# Patient Record
Sex: Male | Born: 1950 | Race: White | Hispanic: No | Marital: Single | Smoking: Never smoker
Health system: Southern US, Community
[De-identification: ages and names within clinical notes are randomized; demographics above are authoritative.]

## PROBLEM LIST (undated history)

## (undated) DIAGNOSIS — H9193 Unspecified hearing loss, bilateral: Secondary | ICD-10-CM

## (undated) DIAGNOSIS — I509 Heart failure, unspecified: Secondary | ICD-10-CM

## (undated) DIAGNOSIS — M1990 Unspecified osteoarthritis, unspecified site: Secondary | ICD-10-CM

## (undated) DIAGNOSIS — H43399 Other vitreous opacities, unspecified eye: Secondary | ICD-10-CM

## (undated) DIAGNOSIS — I2510 Atherosclerotic heart disease of native coronary artery without angina pectoris: Secondary | ICD-10-CM

## (undated) DIAGNOSIS — G931 Anoxic brain damage, not elsewhere classified: Secondary | ICD-10-CM

## (undated) DIAGNOSIS — F329 Major depressive disorder, single episode, unspecified: Secondary | ICD-10-CM

## (undated) DIAGNOSIS — N1832 Chronic kidney disease, stage 3b: Secondary | ICD-10-CM

## (undated) DIAGNOSIS — I10 Essential (primary) hypertension: Secondary | ICD-10-CM

## (undated) DIAGNOSIS — C801 Malignant (primary) neoplasm, unspecified: Secondary | ICD-10-CM

## (undated) DIAGNOSIS — E1122 Type 2 diabetes mellitus with diabetic chronic kidney disease: Secondary | ICD-10-CM

## (undated) DIAGNOSIS — N200 Calculus of kidney: Secondary | ICD-10-CM

## (undated) DIAGNOSIS — I5022 Chronic systolic (congestive) heart failure: Secondary | ICD-10-CM

## (undated) DIAGNOSIS — Z955 Presence of coronary angioplasty implant and graft: Secondary | ICD-10-CM

## (undated) DIAGNOSIS — F419 Anxiety disorder, unspecified: Secondary | ICD-10-CM

## (undated) DIAGNOSIS — H269 Unspecified cataract: Secondary | ICD-10-CM

## (undated) DIAGNOSIS — I219 Acute myocardial infarction, unspecified: Secondary | ICD-10-CM

## (undated) DIAGNOSIS — N529 Male erectile dysfunction, unspecified: Secondary | ICD-10-CM

## (undated) DIAGNOSIS — L409 Psoriasis, unspecified: Secondary | ICD-10-CM

## (undated) DIAGNOSIS — F32A Depression, unspecified: Secondary | ICD-10-CM

## (undated) DIAGNOSIS — E785 Hyperlipidemia, unspecified: Secondary | ICD-10-CM

## (undated) DIAGNOSIS — F39 Unspecified mood [affective] disorder: Secondary | ICD-10-CM

## (undated) DIAGNOSIS — I252 Old myocardial infarction: Secondary | ICD-10-CM

## (undated) DIAGNOSIS — K219 Gastro-esophageal reflux disease without esophagitis: Secondary | ICD-10-CM

## (undated) DIAGNOSIS — G709 Myoneural disorder, unspecified: Secondary | ICD-10-CM

## (undated) HISTORY — DX: Unspecified osteoarthritis, unspecified site: M19.90

## (undated) HISTORY — DX: Hyperlipidemia, unspecified: E78.5

## (undated) HISTORY — DX: Malignant (primary) neoplasm, unspecified: C80.1

## (undated) HISTORY — DX: Major depressive disorder, single episode, unspecified: F32.9

## (undated) HISTORY — DX: Calculus of kidney: N20.0

## (undated) HISTORY — DX: Unspecified cataract: H26.9

## (undated) HISTORY — DX: Heart failure, unspecified: I50.9

## (undated) HISTORY — DX: Gastro-esophageal reflux disease without esophagitis: K21.9

## (undated) HISTORY — DX: Anxiety disorder, unspecified: F41.9

## (undated) HISTORY — DX: Depression, unspecified: F32.A

## (undated) HISTORY — PX: BASAL CELL CARCINOMA EXCISION: SHX1214

## (undated) HISTORY — DX: Acute myocardial infarction, unspecified: I21.9

## (undated) HISTORY — DX: Other vitreous opacities, unspecified eye: H43.399

## (undated) HISTORY — DX: Myoneural disorder, unspecified: G70.9

## (undated) HISTORY — DX: Essential (primary) hypertension: I10

## (undated) HISTORY — DX: Atherosclerotic heart disease of native coronary artery without angina pectoris: I25.10

## (undated) HISTORY — PX: HERNIA REPAIR: SHX51

## (undated) HISTORY — DX: Male erectile dysfunction, unspecified: N52.9

---

## 2000-02-21 ENCOUNTER — Ambulatory Visit (HOSPITAL_BASED_OUTPATIENT_CLINIC_OR_DEPARTMENT_OTHER): Admission: RE | Admit: 2000-02-21 | Discharge: 2000-02-21 | Payer: Self-pay | Admitting: Surgery

## 2000-02-21 ENCOUNTER — Encounter (INDEPENDENT_AMBULATORY_CARE_PROVIDER_SITE_OTHER): Payer: Self-pay | Admitting: *Deleted

## 2000-05-21 ENCOUNTER — Encounter: Admission: RE | Admit: 2000-05-21 | Discharge: 2000-05-21 | Payer: Self-pay | Admitting: Surgery

## 2000-05-21 ENCOUNTER — Encounter: Payer: Self-pay | Admitting: Surgery

## 2001-04-13 ENCOUNTER — Encounter: Admission: RE | Admit: 2001-04-13 | Discharge: 2001-07-12 | Payer: Self-pay | Admitting: Family Medicine

## 2003-11-04 ENCOUNTER — Emergency Department (HOSPITAL_COMMUNITY): Admission: AD | Admit: 2003-11-04 | Discharge: 2003-11-04 | Payer: Self-pay | Admitting: Family Medicine

## 2004-05-03 ENCOUNTER — Encounter: Admission: RE | Admit: 2004-05-03 | Discharge: 2004-05-03 | Payer: Self-pay | Admitting: Family Medicine

## 2004-07-23 ENCOUNTER — Ambulatory Visit: Payer: Self-pay | Admitting: Family Medicine

## 2005-02-24 ENCOUNTER — Ambulatory Visit: Payer: Self-pay | Admitting: Family Medicine

## 2005-03-24 ENCOUNTER — Ambulatory Visit: Payer: Self-pay | Admitting: Family Medicine

## 2005-04-03 ENCOUNTER — Ambulatory Visit: Payer: Self-pay | Admitting: Family Medicine

## 2005-04-15 ENCOUNTER — Ambulatory Visit: Payer: Self-pay | Admitting: Family Medicine

## 2005-04-22 ENCOUNTER — Encounter: Admission: RE | Admit: 2005-04-22 | Discharge: 2005-04-22 | Payer: Self-pay | Admitting: Family Medicine

## 2005-06-04 ENCOUNTER — Ambulatory Visit: Payer: Self-pay | Admitting: Family Medicine

## 2005-06-25 ENCOUNTER — Ambulatory Visit: Payer: Self-pay | Admitting: Family Medicine

## 2005-09-11 ENCOUNTER — Ambulatory Visit: Payer: Self-pay | Admitting: Family Medicine

## 2005-11-20 ENCOUNTER — Ambulatory Visit: Payer: Self-pay | Admitting: Family Medicine

## 2005-11-20 ENCOUNTER — Inpatient Hospital Stay (HOSPITAL_COMMUNITY): Admission: EM | Admit: 2005-11-20 | Discharge: 2005-11-21 | Payer: Self-pay | Admitting: Emergency Medicine

## 2005-12-16 ENCOUNTER — Ambulatory Visit: Payer: Self-pay | Admitting: Family Medicine

## 2005-12-31 ENCOUNTER — Encounter: Admission: RE | Admit: 2005-12-31 | Discharge: 2005-12-31 | Payer: Self-pay | Admitting: Family Medicine

## 2006-01-13 ENCOUNTER — Encounter: Admission: RE | Admit: 2006-01-13 | Discharge: 2006-01-13 | Payer: Self-pay | Admitting: Family Medicine

## 2006-01-13 ENCOUNTER — Other Ambulatory Visit: Admission: RE | Admit: 2006-01-13 | Discharge: 2006-01-13 | Payer: Self-pay | Admitting: Interventional Radiology

## 2006-01-13 ENCOUNTER — Encounter (INDEPENDENT_AMBULATORY_CARE_PROVIDER_SITE_OTHER): Payer: Self-pay | Admitting: *Deleted

## 2006-04-24 ENCOUNTER — Ambulatory Visit: Payer: Self-pay | Admitting: Family Medicine

## 2006-05-01 ENCOUNTER — Ambulatory Visit: Payer: Self-pay | Admitting: Family Medicine

## 2007-04-12 DIAGNOSIS — F411 Generalized anxiety disorder: Secondary | ICD-10-CM | POA: Insufficient documentation

## 2007-04-12 DIAGNOSIS — E782 Mixed hyperlipidemia: Secondary | ICD-10-CM | POA: Insufficient documentation

## 2007-04-12 DIAGNOSIS — I10 Essential (primary) hypertension: Secondary | ICD-10-CM | POA: Insufficient documentation

## 2007-04-12 DIAGNOSIS — F39 Unspecified mood [affective] disorder: Secondary | ICD-10-CM | POA: Insufficient documentation

## 2007-04-12 DIAGNOSIS — K219 Gastro-esophageal reflux disease without esophagitis: Secondary | ICD-10-CM | POA: Insufficient documentation

## 2007-04-30 ENCOUNTER — Ambulatory Visit: Payer: Self-pay | Admitting: Family Medicine

## 2007-04-30 LAB — CONVERTED CEMR LAB
Bilirubin Urine: NEGATIVE
Glucose, Urine, Semiquant: NEGATIVE
Ketones, urine, test strip: NEGATIVE
Specific Gravity, Urine: 1.02

## 2007-05-03 LAB — CONVERTED CEMR LAB
Alkaline Phosphatase: 36 units/L — ABNORMAL LOW (ref 39–117)
BUN: 18 mg/dL (ref 6–23)
Basophils Absolute: 0.1 10*3/uL (ref 0.0–0.1)
Basophils Relative: 1.5 % — ABNORMAL HIGH (ref 0.0–1.0)
CO2: 27 meq/L (ref 19–32)
Cholesterol: 168 mg/dL (ref 0–200)
GFR calc Af Amer: 89 mL/min
HDL: 51.8 mg/dL (ref >39.0)
Hemoglobin: 13.7 g/dL (ref 13.0–17.0)
Lymphocytes Relative: 23.4 % (ref 12.0–46.0)
MCHC: 34.5 g/dL (ref 30.0–36.0)
Monocytes Absolute: 0.6 10*3/uL (ref 0.2–0.7)
Monocytes Relative: 10.1 % (ref 3.0–11.0)
Neutro Abs: 3.6 10*3/uL (ref 1.4–7.7)
PSA: 2.45 ng/mL (ref 0.10–4.00)
Potassium: 4.4 meq/L (ref 3.5–5.1)
Sodium: 136 meq/L (ref 135–145)
TSH: 1.72 microintl units/mL (ref 0.35–5.50)
Total Protein: 7.1 g/dL (ref 6.0–8.3)

## 2007-05-10 ENCOUNTER — Ambulatory Visit: Payer: Self-pay | Admitting: Family Medicine

## 2007-05-11 ENCOUNTER — Encounter: Payer: Self-pay | Admitting: Family Medicine

## 2007-05-11 LAB — CONVERTED CEMR LAB

## 2007-06-14 ENCOUNTER — Encounter: Payer: Self-pay | Admitting: Family Medicine

## 2007-06-25 ENCOUNTER — Ambulatory Visit: Payer: Self-pay | Admitting: Internal Medicine

## 2007-07-09 ENCOUNTER — Encounter: Payer: Self-pay | Admitting: Family Medicine

## 2007-07-09 ENCOUNTER — Ambulatory Visit: Payer: Self-pay | Admitting: Internal Medicine

## 2007-10-12 ENCOUNTER — Ambulatory Visit: Payer: Self-pay | Admitting: Family Medicine

## 2007-10-12 DIAGNOSIS — F528 Other sexual dysfunction not due to a substance or known physiological condition: Secondary | ICD-10-CM | POA: Insufficient documentation

## 2008-01-06 ENCOUNTER — Ambulatory Visit: Payer: Self-pay | Admitting: Family Medicine

## 2008-01-11 ENCOUNTER — Telehealth: Payer: Self-pay | Admitting: Family Medicine

## 2008-03-30 ENCOUNTER — Ambulatory Visit: Payer: Self-pay | Admitting: Family Medicine

## 2008-04-26 ENCOUNTER — Ambulatory Visit: Payer: Self-pay | Admitting: Family Medicine

## 2008-04-26 DIAGNOSIS — L02419 Cutaneous abscess of limb, unspecified: Secondary | ICD-10-CM | POA: Insufficient documentation

## 2008-04-26 DIAGNOSIS — L03119 Cellulitis of unspecified part of limb: Secondary | ICD-10-CM

## 2008-05-02 ENCOUNTER — Ambulatory Visit: Payer: Self-pay | Admitting: Family Medicine

## 2008-05-02 LAB — CONVERTED CEMR LAB
Bilirubin Urine: NEGATIVE
Nitrite: NEGATIVE
Urobilinogen, UA: 0.2
WBC Urine, dipstick: NEGATIVE

## 2008-05-03 LAB — CONVERTED CEMR LAB
ALT: 25 units/L (ref 0–53)
AST: 22 units/L (ref 0–37)
Basophils Relative: 0.7 % (ref 0.0–3.0)
Bilirubin, Direct: 0.1 mg/dL (ref 0.0–0.3)
CO2: 29 meq/L (ref 19–32)
Calcium: 9.5 mg/dL (ref 8.4–10.5)
Chloride: 104 meq/L (ref 96–112)
Creatinine, Ser: 1.1 mg/dL (ref 0.4–1.5)
Glucose, Bld: 107 mg/dL — ABNORMAL HIGH (ref 70–99)
Hemoglobin: 13.8 g/dL (ref 13.0–17.0)
LDL Cholesterol: 128 mg/dL — ABNORMAL HIGH (ref 0–99)
Lymphocytes Relative: 19.7 % (ref 12.0–46.0)
Monocytes Relative: 7.1 % (ref 3.0–12.0)
Neutro Abs: 4.9 10*3/uL (ref 1.4–7.7)
Neutrophils Relative %: 71.6 % (ref 43.0–77.0)
PSA: 2.5 ng/mL (ref 0.10–4.00)
RBC: 4.43 M/uL (ref 4.22–5.81)
TSH: 1.29 microintl units/mL (ref 0.35–5.50)
Total Bilirubin: 0.8 mg/dL (ref 0.3–1.2)
Total CHOL/HDL Ratio: 4.5
Total Protein: 7.4 g/dL (ref 6.0–8.3)
VLDL: 27 mg/dL (ref 0–40)
WBC: 6.8 10*3/uL (ref 4.5–10.5)

## 2008-05-04 ENCOUNTER — Telehealth: Payer: Self-pay | Admitting: Family Medicine

## 2008-05-08 ENCOUNTER — Ambulatory Visit: Payer: Self-pay | Admitting: Family Medicine

## 2008-05-31 ENCOUNTER — Ambulatory Visit: Payer: Self-pay | Admitting: Family Medicine

## 2008-06-27 ENCOUNTER — Telehealth: Payer: Self-pay | Admitting: Family Medicine

## 2008-08-02 ENCOUNTER — Ambulatory Visit: Payer: Self-pay | Admitting: Family Medicine

## 2008-09-11 ENCOUNTER — Ambulatory Visit: Payer: Self-pay | Admitting: Family Medicine

## 2008-09-11 DIAGNOSIS — M542 Cervicalgia: Secondary | ICD-10-CM | POA: Insufficient documentation

## 2008-09-22 DIAGNOSIS — C801 Malignant (primary) neoplasm, unspecified: Secondary | ICD-10-CM

## 2008-09-22 HISTORY — DX: Malignant (primary) neoplasm, unspecified: C80.1

## 2009-03-06 ENCOUNTER — Telehealth: Payer: Self-pay | Admitting: Family Medicine

## 2009-05-25 ENCOUNTER — Ambulatory Visit: Payer: Self-pay | Admitting: Family Medicine

## 2009-05-25 LAB — CONVERTED CEMR LAB
Bilirubin Urine: NEGATIVE
Ketones, urine, test strip: NEGATIVE
Nitrite: NEGATIVE
Protein, U semiquant: NEGATIVE
Urobilinogen, UA: 0.2

## 2009-05-30 LAB — CONVERTED CEMR LAB
AST: 23 units/L (ref 0–37)
Alkaline Phosphatase: 26 units/L — ABNORMAL LOW (ref 39–117)
Basophils Relative: 0 % (ref 0.0–3.0)
Bilirubin, Direct: 0 mg/dL (ref 0.0–0.3)
Calcium: 9.4 mg/dL (ref 8.4–10.5)
Cholesterol: 245 mg/dL — ABNORMAL HIGH (ref 0–200)
Creatinine, Ser: 1.3 mg/dL (ref 0.4–1.5)
Direct LDL: 163.6 mg/dL
Eosinophils Absolute: 0.1 10*3/uL (ref 0.0–0.7)
Eosinophils Relative: 1.5 % (ref 0.0–5.0)
GFR calc non Af Amer: 60.11 mL/min (ref >60)
Hemoglobin: 13.4 g/dL (ref 13.0–17.0)
Lymphocytes Relative: 25.1 % (ref 12.0–46.0)
Monocytes Relative: 10.5 % (ref 3.0–12.0)
Neutro Abs: 3.4 10*3/uL (ref 1.4–7.7)
Neutrophils Relative %: 62.9 % (ref 43.0–77.0)
RBC: 4.22 M/uL (ref 4.22–5.81)
Sodium: 142 meq/L (ref 135–145)
Total CHOL/HDL Ratio: 6
Total Protein: 7.2 g/dL (ref 6.0–8.3)
Triglycerides: 178 mg/dL — ABNORMAL HIGH (ref 0.0–149.0)
VLDL: 35.6 mg/dL (ref 0.0–40.0)
WBC: 5.5 10*3/uL (ref 4.5–10.5)

## 2009-06-01 ENCOUNTER — Ambulatory Visit: Payer: Self-pay | Admitting: Family Medicine

## 2009-06-01 DIAGNOSIS — Z85820 Personal history of malignant melanoma of skin: Secondary | ICD-10-CM | POA: Insufficient documentation

## 2009-09-12 ENCOUNTER — Encounter: Payer: Self-pay | Admitting: Family Medicine

## 2009-09-19 ENCOUNTER — Ambulatory Visit: Payer: Self-pay | Admitting: Family Medicine

## 2009-09-27 LAB — CONVERTED CEMR LAB
Cholesterol: 204 mg/dL — ABNORMAL HIGH (ref 0–200)
Total CHOL/HDL Ratio: 4
VLDL: 26.4 mg/dL (ref 0.0–40.0)

## 2009-12-24 ENCOUNTER — Telehealth: Payer: Self-pay | Admitting: Family Medicine

## 2010-01-01 ENCOUNTER — Ambulatory Visit: Payer: Self-pay | Admitting: Family Medicine

## 2010-01-01 DIAGNOSIS — N401 Enlarged prostate with lower urinary tract symptoms: Secondary | ICD-10-CM

## 2010-01-01 DIAGNOSIS — N138 Other obstructive and reflux uropathy: Secondary | ICD-10-CM | POA: Insufficient documentation

## 2010-01-01 LAB — CONVERTED CEMR LAB
Bilirubin Urine: NEGATIVE
Nitrite: NEGATIVE
Urobilinogen, UA: 0.2

## 2010-01-16 ENCOUNTER — Ambulatory Visit: Payer: Self-pay | Admitting: Family Medicine

## 2010-04-15 ENCOUNTER — Telehealth: Payer: Self-pay | Admitting: Family Medicine

## 2010-06-13 ENCOUNTER — Ambulatory Visit: Payer: Self-pay | Admitting: Family Medicine

## 2010-06-13 LAB — CONVERTED CEMR LAB
Glucose, Urine, Semiquant: NEGATIVE
Nitrite: NEGATIVE
Protein, U semiquant: NEGATIVE
Urobilinogen, UA: 0.2
WBC Urine, dipstick: NEGATIVE

## 2010-06-17 LAB — CONVERTED CEMR LAB
ALT: 21 units/L (ref 0–53)
AST: 24 units/L (ref 0–37)
Basophils Relative: 0.9 % (ref 0.0–3.0)
Bilirubin, Direct: 0.1 mg/dL (ref 0.0–0.3)
Chloride: 99 meq/L (ref 96–112)
Eosinophils Relative: 1.4 % (ref 0.0–5.0)
HCT: 38.9 % — ABNORMAL LOW (ref 39.0–52.0)
Lymphs Abs: 1.3 10*3/uL (ref 0.7–4.0)
MCV: 91.4 fL (ref 78.0–100.0)
Monocytes Absolute: 0.6 10*3/uL (ref 0.1–1.0)
Neutrophils Relative %: 64.7 % (ref 43.0–77.0)
PSA: 2.99 ng/mL (ref 0.10–4.00)
Potassium: 4.9 meq/L (ref 3.5–5.1)
RBC: 4.26 M/uL (ref 4.22–5.81)
Sodium: 137 meq/L (ref 135–145)
Total CHOL/HDL Ratio: 4
Total Protein: 6.7 g/dL (ref 6.0–8.3)
VLDL: 29 mg/dL (ref 0.0–40.0)
WBC: 5.6 10*3/uL (ref 4.5–10.5)

## 2010-06-19 ENCOUNTER — Encounter: Payer: Self-pay | Admitting: Family Medicine

## 2010-06-19 ENCOUNTER — Ambulatory Visit: Payer: Self-pay | Admitting: Family Medicine

## 2010-10-13 ENCOUNTER — Encounter: Payer: Self-pay | Admitting: Family Medicine

## 2010-10-22 NOTE — Progress Notes (Signed)
Summary: refill lorazepam  Phone Note Refill Request Message from:  Patient---live call  Refills Requested: Medication #1:  LORAZEPAM 1 MG  TABS four  times a day as needed [facility] [place] pt has an appt on 01-01-2010.  Initial call taken by: Warnell Forester,  December 24, 2009 3:56 PM  Follow-up for Phone Call        call in #120 with 5 rf Follow-up by: Nelwyn Salisbury MD,  December 24, 2009 5:09 PM    Prescriptions: LORAZEPAM 1 MG  TABS (LORAZEPAM) four  times a day as needed anxiety  #120 x 5   Entered by:   Duard Brady LPN   Authorized by:   Nelwyn Salisbury MD   Signed by:   Duard Brady LPN on 60/45/4098   Method used:   Historical   RxID:   [id number]

## 2010-10-22 NOTE — Assessment & Plan Note (Signed)
Summary: CONGESTION//CCM   Vital Signs:  Patient profile:   60 year old male Weight:      185 pounds BMI:     25.18 Temp:     98.5 degrees F oral BP sitting:   120 / 76  (left arm) Cuff size:   regular  Vitals Entered By: Raechel Ache, RN (January 16, 2010 11:32 AM) CC: C/o congestion and cough x 5 days.Taking mucinex   History of Present Illness: Here for 2 weeks of sinus pressure, HA, ST, and a dry cough. No fever. On Mucinex.   Allergies: No Known Drug Allergies  Past History:  Past Medical History: Reviewed history from 01/01/2010 and no changes required. Anxiety Melanoma, sees Dr. Sharyn Lull GERD Hyperlipidemia Hypertension ED chronic bilateral foot pain  Review of Systems  The patient denies anorexia, fever, weight loss, weight gain, vision loss, decreased hearing, hoarseness, chest pain, syncope, dyspnea on exertion, peripheral edema, hemoptysis, abdominal pain, melena, hematochezia, severe indigestion/heartburn, hematuria, incontinence, genital sores, muscle weakness, suspicious skin lesions, transient blindness, difficulty walking, depression, unusual weight change, abnormal bleeding, enlarged lymph nodes, angioedema, breast masses, and testicular masses.    Physical Exam  General:  Well-developed,well-nourished,in no acute distress; alert,appropriate and cooperative throughout examination Head:  Normocephalic and atraumatic without obvious abnormalities. No apparent alopecia or balding. Eyes:  No corneal or conjunctival inflammation noted. EOMI. Perrla. Funduscopic exam benign, without hemorrhages, exudates or papilledema. Vision grossly normal. Ears:  External ear exam shows no significant lesions or deformities.  Otoscopic examination reveals clear canals, tympanic membranes are intact bilaterally without bulging, retraction, inflammation or discharge. Hearing is grossly normal bilaterally. Nose:  External nasal examination shows no deformity or inflammation.  Nasal mucosa are pink and moist without lesions or exudates. Mouth:  Oral mucosa and oropharynx without lesions or exudates.  Teeth in good repair. Neck:  No deformities, masses, or tenderness noted. Lungs:  Normal respiratory effort, chest expands symmetrically. Lungs are clear to auscultation, no crackles or wheezes.   Impression & Recommendations:  Problem # 1:  ACUTE SINUSITIS, UNSPECIFIED (ICD-461.9)  His updated medication list for this problem includes:    Doxycycline Hyclate 100 Mg Caps (Doxycycline hyclate) .Marland Kitchen..Marland Kitchen Two times a day  Complete Medication List: 1)  Prilosec Otc 20 Mg Tbec (Omeprazole magnesium) .Marland Kitchen.. 1 by mouth once daily 2)  Cymbalta 20 Mg Cpep (Duloxetine hcl) .... Once daily 3)  Lorazepam 1 Mg Tabs (Lorazepam) .... Four  times a day as needed anxiety 4)  Lisinopril-hydrochlorothiazide 20-25 Mg Tabs (Lisinopril-hydrochlorothiazide) .... Once daily 5)  Nabumetone 750 Mg Tabs (Nabumetone) .... 2 tabs by mouth every morning 6)  Cialis 20 Mg Tabs (Tadalafil) .Marland Kitchen.. 1 tablet every other day as needed for erectile dysfunction 7)  Doxycycline Hyclate 100 Mg Caps (Doxycycline hyclate) .... Two times a day  Patient Instructions: 1)  Please schedule a follow-up appointment as needed .  Prescriptions: DOXYCYCLINE HYCLATE 100 MG CAPS (DOXYCYCLINE HYCLATE) two times a day  #20 x 0   Entered and Authorized by:   Nelwyn Salisbury MD   Signed by:   Nelwyn Salisbury MD on 01/16/2010   Method used:   [facility]        [facility]  [place] (retail)       [address]       [place], [place]  [address]       Ph: [phone] or [phone]       Fax: [phone]   RxID:   [id number]

## 2010-10-22 NOTE — Assessment & Plan Note (Signed)
Summary: fup on meds//ccm   Vital Signs:  Patient profile:   60 year old male Weight:      183 pounds BP sitting:   130 / 76  (left arm) Cuff size:   regular  Vitals Entered By: Raechel Ache, RN (January 01, 2010 3:35 PM) CC: Problems with urination- ?'s about prostate.   History of Present Illness: Here to followup on meds and to ask about some frequent urinations in the past month. No burning or discomfort. No urgency. he did show some mild BPH at his cpx last fall. The Cymbalta and Lorazepam are working well.   Allergies: No Known Drug Allergies  Past History:  Past Medical History: Anxiety Melanoma, sees Dr. Sharyn Lull GERD Hyperlipidemia Hypertension ED chronic bilateral foot pain  Past Surgical History: Reviewed history from 05/31/2008 and no changes required. Inguinal herniorrhaphy biopsy of thyroid nodule 4-07, benign basal cell cancer removed from scalp per Dr. Danella Deis colonoscopy 07-09-07 per Dr. Juanda Chance, repeat in 10 yrs  Review of Systems  The patient denies anorexia, fever, weight loss, weight gain, vision loss, decreased hearing, hoarseness, chest pain, syncope, dyspnea on exertion, peripheral edema, prolonged cough, headaches, hemoptysis, abdominal pain, melena, hematochezia, severe indigestion/heartburn, hematuria, incontinence, genital sores, muscle weakness, suspicious skin lesions, transient blindness, difficulty walking, depression, unusual weight change, abnormal bleeding, enlarged lymph nodes, angioedema, breast masses, and testicular masses.    Physical Exam  General:  Well-developed,well-nourished,in no acute distress; alert,appropriate and cooperative throughout examination Lungs:  Normal respiratory effort, chest expands symmetrically. Lungs are clear to auscultation, no crackles or wheezes. Heart:  Normal rate and regular rhythm. S1 and S2 normal without gallop, murmur, click, rub or other extra sounds. Abdomen:  Bowel sounds positive,abdomen  soft and non-tender without masses, organomegaly or hernias noted. Psych:  Cognition and judgment appear intact. Alert and cooperative with normal attention span and concentration. No apparent delusions, illusions, hallucinations   Impression & Recommendations:  Problem # 1:  BENIGN PROSTATIC HYPERTROPHY, WITH OBSTRUCTION (ICD-600.01)  Orders: UA Dipstick w/o Micro (manual) (09811)  Problem # 2:  HYPERTENSION (ICD-401.9)  His updated medication list for this problem includes:    Lisinopril-hydrochlorothiazide 20-25 Mg Tabs (Lisinopril-hydrochlorothiazide) ..... Once daily  Problem # 3:  ANXIETY (ICD-300.00)  His updated medication list for this problem includes:    Cymbalta 20 Mg Cpep (Duloxetine hcl) ..... Once daily    Lorazepam 1 Mg Tabs (Lorazepam) .Marland Kitchen... Four  times a day as needed anxiety  Complete Medication List: 1)  Prilosec Otc 20 Mg Tbec (Omeprazole magnesium) .Marland Kitchen.. 1 by mouth once daily 2)  Cymbalta 20 Mg Cpep (Duloxetine hcl) .... Once daily 3)  Lorazepam 1 Mg Tabs (Lorazepam) .... Four  times a day as needed anxiety 4)  Lisinopril-hydrochlorothiazide 20-25 Mg Tabs (Lisinopril-hydrochlorothiazide) .... Once daily 5)  Nabumetone 750 Mg Tabs (Nabumetone) .... 2 tabs by mouth every morning 6)  Cialis 20 Mg Tabs (Tadalafil) .Marland Kitchen.. 1 tablet every other day as needed for erectile dysfunction  Patient Instructions: 1)  He seems to be having some prostatic obstructive symptoms. He will try saw palmetto OTC for a month or two, then follow up as needed  Prescriptions: CYMBALTA 20 MG  CPEP (DULOXETINE HCL) once daily  #30 x 11   Entered and Authorized by:   Nelwyn Salisbury MD   Signed by:   Nelwyn Salisbury MD on 01/01/2010   Method used:   [facility]        [facility]  [place] [phone][facility] (retail)  [address]       [place], [place]  [address]       Ph: [phone] or [phone]       Fax: [phone]   RxID:   [id number]   Laboratory Results   Urine  Tests    Routine Urinalysis   Color: lt. yellow Appearance: Clear Glucose: negative   (Normal Range: Negative) Bilirubin: negative   (Normal Range: Negative) Ketone: negative   (Normal Range: Negative) Spec. Gravity: 1.010   (Normal Range: 1.003-1.035) Blood: negative   (Normal Range: Negative) pH: 5.0   (Normal Range: 5.0-8.0) Protein: negative   (Normal Range: Negative) Urobilinogen: 0.2   (Normal Range: 0-1) Nitrite: negative   (Normal Range: Negative) Leukocyte Esterace: negative   (Normal Range: Negative)

## 2010-10-22 NOTE — Progress Notes (Signed)
Summary: hiv added  Phone Note Call from Patient Call back at Home Phone [phone]   Caller: Patient Call For: Nelwyn Salisbury MD Summary of Call: pt would like to have HIV added to cpx labs Initial call taken by: Heron Sabins,  April 15, 2010 3:03 PM  Follow-up for Phone Call        please add an HIV test Follow-up by: Nelwyn Salisbury MD,  April 15, 2010 5:16 PM  Additional Follow-up for Phone Call Additional follow up Details #1::        PT IS AWARE Additional Follow-up by: Heron Sabins,  April 16, 2010 9:12 AM

## 2010-10-22 NOTE — Assessment & Plan Note (Signed)
Summary: cpx/njr   Vital Signs:  Patient profile:   60 year old male Weight:      184 pounds O2 Sat:      98 % Temp:     98.4 degrees F Pulse rate:   98 / minute BP sitting:   124 / 72  (left arm) Cuff size:   regular  Vitals Entered By: Pura Spice, RN (June 19, 2010 1:43 PM) CC: cpx refills    History of Present Illness: 60 yr old male for a cpx. He is doing well in general. Tries to get some exercise.   Allergies (verified): No Known Drug Allergies  Past History:  Past Medical History: Reviewed history from 01/01/2010 and no changes required. Anxiety Melanoma, sees Dr. Sharyn Lull GERD Hyperlipidemia Hypertension ED chronic bilateral foot pain  Past Surgical History: Reviewed history from 05/31/2008 and no changes required. Inguinal herniorrhaphy biopsy of thyroid nodule 4-07, benign basal cell cancer removed from scalp per Dr. Danella Deis colonoscopy 07-09-07 per Dr. Juanda Chance, repeat in 10 yrs  Family History: Reviewed history from 04/12/2007 and no changes required. Family History Depression Family History Diabetes 1st degree relative Family History High cholesterol Family History Hypertension Fam hx Alzheimer's  Social History: Reviewed history from 05/10/2007 and no changes required. Alcohol use-yes Drug use-no Occupation: Tourist information centre manager Single  Review of Systems  The patient denies anorexia, fever, weight loss, weight gain, vision loss, decreased hearing, hoarseness, chest pain, syncope, dyspnea on exertion, peripheral edema, prolonged cough, headaches, hemoptysis, abdominal pain, melena, hematochezia, severe indigestion/heartburn, hematuria, incontinence, genital sores, muscle weakness, suspicious skin lesions, transient blindness, difficulty walking, depression, unusual weight change, abnormal bleeding, enlarged lymph nodes, angioedema, breast masses, and testicular masses.         Flu Vaccine Consent Questions     Do you have a  history of severe allergic reactions to this vaccine? no    Any prior history of allergic reactions to egg and/or gelatin? no    Do you have a sensitivity to the preservative Thimersol? no    Do you have a past history of Guillan-Barre Syndrome? no    Do you currently have an acute febrile illness? no    Have you ever had a severe reaction to latex? no    Vaccine information given and explained to patient? yes    Are you currently pregnant? no    Lot Number:[id number]   Exp Date:03/22/2011   Site Given  Left Deltoid IM Pura Spice, RN  June 19, 2010 1:45 PM   Physical Exam  General:  Well-developed,well-nourished,in no acute distress; alert,appropriate and cooperative throughout examination Head:  Normocephalic and atraumatic without obvious abnormalities. No apparent alopecia or balding. Eyes:  No corneal or conjunctival inflammation noted. EOMI. Perrla. Funduscopic exam benign, without hemorrhages, exudates or papilledema. Vision grossly normal. Ears:  External ear exam shows no significant lesions or deformities.  Otoscopic examination reveals clear canals, tympanic membranes are intact bilaterally without bulging, retraction, inflammation or discharge. Hearing is grossly normal bilaterally. Nose:  External nasal examination shows no deformity or inflammation. Nasal mucosa are pink and moist without lesions or exudates. Mouth:  Oral mucosa and oropharynx without lesions or exudates.  Teeth in good repair. Neck:  No deformities, masses, or tenderness noted. Chest Wall:  No deformities, masses, tenderness or gynecomastia noted. Lungs:  Normal respiratory effort, chest expands symmetrically. Lungs are clear to auscultation, no crackles or wheezes. Heart:  Normal rate and regular rhythm. S1 and S2 normal without gallop, murmur,  click, rub or other extra sounds. EKG normal Abdomen:  Bowel sounds positive,abdomen soft and non-tender without masses, organomegaly or hernias noted. Rectal:   No external abnormalities noted. Normal sphincter tone. No rectal masses or tenderness. Heme neg. Genitalia:  Testes bilaterally descended without nodularity, tenderness or masses. No scrotal masses or lesions. No penis lesions or urethral discharge. Prostate:  no nodules, no asymmetry, no induration, and 2+ enlarged.   Msk:  No deformity or scoliosis noted of thoracic or lumbar spine.   Pulses:  R and L carotid,radial,femoral,dorsalis pedis and posterior tibial pulses are full and equal bilaterally Extremities:  No clubbing, cyanosis, edema, or deformity noted with normal full range of motion of all joints.   Neurologic:  No cranial nerve deficits noted. Station and gait are normal. Plantar reflexes are down-going bilaterally. DTRs are symmetrical throughout. Sensory, motor and coordinative functions appear intact. Skin:  Intact without suspicious lesions or rashes Cervical Nodes:  No lymphadenopathy noted Axillary Nodes:  No palpable lymphadenopathy Inguinal Nodes:  No significant adenopathy Psych:  Cognition and judgment appear intact. Alert and cooperative with normal attention span and concentration. No apparent delusions, illusions, hallucinations   Impression & Recommendations:  Problem # 1:  EXAMINATION, ROUTINE MEDICAL (ICD-V70.0)  Orders: Hemoccult Guaiac-1 spec.(in office) (82270) EKG w/ Interpretation (93000)  Complete Medication List: 1)  Prilosec Otc 20 Mg Tbec (Omeprazole magnesium) .Marland Kitchen.. 1 by mouth once daily 2)  Cymbalta 20 Mg Cpep (Duloxetine hcl) .... Once daily 3)  Lorazepam 1 Mg Tabs (Lorazepam) .... Four  times a day as needed anxiety 4)  Lisinopril-hydrochlorothiazide 20-25 Mg Tabs (Lisinopril-hydrochlorothiazide) .... Once daily 5)  Nabumetone 750 Mg Tabs (Nabumetone) .... 2 tabs by mouth every morning 6)  Cialis 20 Mg Tabs (Tadalafil) .Marland Kitchen.. 1 tablet every other day as needed for erectile dysfunction 7)  Flexeril 10 Mg Tabs (Cyclobenzaprine hcl) .... Three times a  day as needed muscle spasms  Other Orders: Admin 1st Vaccine ([address]109) Flu Vaccine 50yrs + (60454) Tdap => 34yrs IM (09811) Admin of Any Addtl Vaccine (91478) Venipuncture (29562) T-HIV Antibody  (Reflex) (13086-57846) T-2 View CXR (71020TC)  Patient Instructions: 1)  get a CXR today Prescriptions: FLEXERIL 10 MG TABS (CYCLOBENZAPRINE HCL) three times a day as needed muscle spasms  #60 x 5   Entered and Authorized by:   Nelwyn Salisbury MD   Signed by:   Nelwyn Salisbury MD on 06/19/2010   Method used:   Print then Give to Patient   RxID:   [id number] LORAZEPAM 1 MG  TABS (LORAZEPAM) four  times a day as needed anxiety  #120 x 5   Entered and Authorized by:   Nelwyn Salisbury MD   Signed by:   Nelwyn Salisbury MD on 06/19/2010   Method used:   Print then Give to Patient   RxID:   [id number]    Immunizations Administered:  Tetanus Vaccine:    Vaccine Type: Tdap    Site: right deltoid    Mfr: GlaxoSmithKline    Dose: 0.5 ml    Route: IM    Given by: Pura Spice, RN    Exp. Date: 07/11/2012    Lot #: [id number]    VIS given: 08/09/08 version given June 19, 2010.

## 2010-11-17 ENCOUNTER — Other Ambulatory Visit: Payer: Self-pay | Admitting: Family Medicine

## 2010-12-13 ENCOUNTER — Other Ambulatory Visit: Payer: Self-pay

## 2010-12-13 NOTE — Telephone Encounter (Signed)
called

## 2010-12-13 NOTE — Telephone Encounter (Signed)
Refill lorazepam 1 mg  06-19-2010 [facility] [phone]

## 2010-12-13 NOTE — Telephone Encounter (Signed)
Call in #120 with 5 rf 

## 2010-12-23 ENCOUNTER — Encounter: Payer: Self-pay | Admitting: Family Medicine

## 2010-12-23 ENCOUNTER — Ambulatory Visit (INDEPENDENT_AMBULATORY_CARE_PROVIDER_SITE_OTHER): Payer: BC Managed Care – PPO | Admitting: Family Medicine

## 2010-12-23 DIAGNOSIS — J329 Chronic sinusitis, unspecified: Secondary | ICD-10-CM

## 2010-12-23 MED ORDER — PREDNISONE (PAK) 10 MG PO TABS: ORAL_TABLET | ORAL | Status: DC

## 2010-12-23 MED ORDER — AZITHROMYCIN 250 MG PO TABS: ORAL_TABLET | ORAL | Status: AC

## 2010-12-24 ENCOUNTER — Encounter: Payer: Self-pay | Admitting: Family Medicine

## 2010-12-24 NOTE — Progress Notes (Signed)
  Subjective:    Patient ID: [name], male    DOB: November 06, 1950, 60 y.o.   MRN: [id number]  HPI Here for one week of stuffy head, PND, ST, hoarseness, and a dry cough. No fever. Using Mucinex D.    Review of Systems  Constitutional: Negative.   HENT: Positive for congestion, sore throat, postnasal drip and sinus pressure.   Eyes: Negative.   Respiratory: Positive for cough.        Objective:   Physical Exam  Constitutional: He appears well-developed and well-nourished.  HENT:  Head: Normocephalic.  Right Ear: External ear normal.  Left Ear: External ear normal.  Nose: Nose normal.       OP is red without exudate. Voice is hoarse  Eyes: Conjunctivae are normal. Pupils are equal, round, and reactive to light.  Neck: Normal range of motion. Neck supple.       Shotty tender bilateral AC nodes  Cardiovascular: Normal rate, regular rhythm, normal heart sounds and intact distal pulses.   Pulmonary/Chest: Effort normal and breath sounds normal. No respiratory distress. He has no wheezes. He has no rales. He exhibits no tenderness.          Assessment & Plan:  Return prn

## 2011-01-21 ENCOUNTER — Other Ambulatory Visit: Payer: Self-pay | Admitting: Family Medicine

## 2011-01-21 NOTE — Telephone Encounter (Signed)
Refill request for Cyclobenzaprine 10mg  Tab #60x5

## 2011-01-22 ENCOUNTER — Other Ambulatory Visit: Payer: Self-pay | Admitting: *Deleted

## 2011-01-22 MED ORDER — CYCLOBENZAPRINE HCL 10 MG PO TABS: 10.0000 mg | ORAL_TABLET | Freq: Three times a day (TID) | ORAL | Status: DC | PRN

## 2011-01-22 NOTE — Telephone Encounter (Signed)
rx sent in electronically 

## 2011-01-22 NOTE — Telephone Encounter (Signed)
Pt called to check on status for Cyclobenzaprine 10 mg. [facility] [place] [facility]. Pt out of med and had called pharmacy this weekend to req refill.

## 2011-02-07 NOTE — Discharge Summary (Signed)
NAME:  [name], [name] NO.:  [id number]   MEDICAL RECORD NO.:  [id number]          PATIENT TYPE:  INP   LOCATION:  6739                         FACILITY:  [facility]   PHYSICIAN:  Leighton Roach McDiarmid, M.D.DATE OF BIRTH:  Jun 06, 1951   DATE OF ADMISSION:  11/19/2005  DATE OF DISCHARGE:  11/21/2005                                 DISCHARGE SUMMARY   ADDENDUM:      Benn Moulder, M.D.    ______________________________  Leighton Roach McDiarmid, M.D.    MR/MEDQ  D:  11/21/2005  T:  11/22/2005  Job:  [id number]   cc:   [facility]   Jeannett Senior A. Clent Ridges, M.D. [facility]  [address] [place]  [place] [address]

## 2011-02-07 NOTE — H&P (Signed)
NAME:  Dalton, [name] NO.:  [id number]   MEDICAL RECORD NO.:  [id number]           PATIENT TYPE:   LOCATION:                                 FACILITY:   PHYSICIAN:  Dalton Fiddler, MD      DATE OF BIRTH:   DATE OF ADMISSION:  11/20/2005  DATE OF DISCHARGE:                                HISTORY & PHYSICAL   PRIMARY CARE PHYSICIAN:  [facility], Dr. Gershon [name]   CHIEF COMPLAINT:  Abscess on thigh.   HISTORY OF PRESENT ILLNESS:  Patient is a 60 year old white male sent from  [facility] for recent abscess of right thigh with worsening and  being started on Bactrim DS secondary to presumed Staph infection.  Patient  had an I&D today on November 19, 2005 which expressed minimal purulent  fluid.  [facility] was concerned for MRSA secondary to worsening  after being on Bactrim DS and was sent to the emergency room.  The patient  states that he began with what he called a temple or an infection of the  little hair follicle on his right leg.  He had popped it himself and placed  some Neosporin on it.  Also used some hydrogen peroxide and thought to be in  no distress.  However, second area slightly superior to this original lesion  and then began to swelling, raised, erythematous with a white pustule head  on the top.  Patient went to [facility] on Saturday, November 15, 2005 where they I&Dd the first area and obtained negative growth cultures  from 36-48 hours.  Patient was started on Bactrim DS and was noted to have  worsening with spreading to the tissue and obvious cellulitis with erythema  and tenderness and swelling.  Patient returned today and had an I&D with  cultures done at [facility] as well as packing with dry sterile  gauze.  Patient has no history of MRSA infections, no history of boils, no  history of diabetes, no history of fever.  He has been achy and slightly  decreased p.o. intake and  increased fatigue and malaise.  Recently he has  been at a nursing home to care for his mother, however, no recent trauma, no  history of bug bites.  One year ago he had a screwdriver trauma that  punctured the right thigh.  However, this has healed since then.  He states  it does hurt some to walk.  Otherwise, review of systems is negative and  noncontributory.  Please see the HPI.   PAST MEDICAL HISTORY:  1.  Hypertension.  2.  Sinusitis.  3.  History of hypercholesterolemia.  4.  History of melanoma status post removal.  5.  Insomnia.   PAST SURGICAL HISTORY:  Significant for inguinal hernia repair bilateral in  2000.   HOME MEDICATIONS:  1.  Norvasc 5 mg p.o. daily.  2.  Cymbalta 20 mg p.o. daily.  3.  Ativan 0.5 two to four q.h.s. to sleep.  4.  Nabumetone 750 mg two at bedtime.  5.  Bactrim DS b.i.d. begun on November 15, 2005.   FAMILY HISTORY:  Significant for a mother with Alzheimer's, depression,  hypertension, and increased cholesterol.  She is currently alive living in  nursing home.  His father he is estranged from and is dead and he does not  know any medical history contributing from his father.  He currently knows  now history of diabetes or history of MRSA.   SOCIAL HISTORY:  He is single.  Lives in [place].  Denies tobacco.  Occasional ethanol use.  Teaches second grade.  Socially he uses monthly  alcohol.  No illicit drugs or herbals.  He currently is a high-risk sexually  active.  He states he does not use anal sex.   PHYSICAL EXAMINATION:  VITAL SIGNS:  Temperature 97.9, heart rate 98, blood  pressure 153/90, respirations 18, saturations 96% on room air.  GENERAL:  He is in no acute distress, conversational.  HEENT:  Normocephalic, atraumatic.  Pupils are equal, round, and reactive to  light bilaterally.  Extraocular muscles intact.  No oral lesions.  Moist  mucous membranes.  CARDIOVASCULAR:  Regular rate and rhythm.  No murmurs, rubs, or gallops.   +2  pulses bilaterally and dorsalis pedis.  LUNGS:  Clear to auscultation bilaterally.  ABDOMEN:  Soft, nontender, nondistended.  EXTREMITIES:  2 x 2 cm abscess, erythematous status post I&D now packed with  sterile gauze, raised, some desquamation of the top layer of the skin.  There is a line drawn around firmness in the tissue approximately 10-15 cm  around in diameter on the right thigh.  Left thigh was completely normal  with 5/5 strength and right thigh was 5/5 strength bilaterally.  NEUROLOGIC:  Cranial nerves II-XII are intact.   ADMISSION LABORATORIES:  White count is 10.3, hemoglobin 13.8, hematocrit  39.5, platelets 215, MCV 86.9, MCH 30.2.  Culture drawn on February 24  showed no growth to date x36-48 hours.   ASSESSMENT/PLAN:  This is a 60 year old white male with an abscess  suspicious for methicillin-resistant Staphylococcus aureus status post I&D  and a four-day history treatment of Bactrim DS now with cellulitis.  1.  Cellulitis.  Will begin intravenous vancomycin.  Will have pharmacy to      dose secondary to currently not having a BMET back for his creatinine      function.  We will have daily dressing changes.  We will monitor him for      fever.  I&D was done at [facility] and will monitor for worsening edema or      enlargement of the cellulitis.  Will check a CBC in the a.m.  HIV will      be checked at [facility].  Cultures were obtained at [facility].      There is no history of trauma, no history of insect bite.  2.  Hypertension.  We are currently going to continue his home medicine of      Norvasc 5 mg p.o. daily, monitor for blood pressure, adjust as      necessary.  This may be secondary secondary to pain.  We can titrate up      his medicines if need be.  3.  History of high risk sexual activity.  HIV was checked at [facility] prior      to arrival to ED.  4.  Fluids, electrolytes, nutrition.  Check BMET in the a.m., supplement as     needed for any  electrolyte abnormalities.  5.  GI.  We are going to begin Protonix 40 mg daily.  6.  Deep venous thrombosis prophylaxis.  He is ambulatory.  7.  Insomnia.  Will continue his home medicines of Ativan and nabumetone.  8.  History of depression.  Continue Cymbalta.  9.  Code status.  Patient is currently a full code.      Barth Kirks, M.D.    ______________________________  Dalton Fiddler, MD    MB/MEDQ  D:  11/20/2005  T:  11/20/2005  Job:  [id number]   cc:   Tera Mater. Clent Ridges, M.D. [facility]  [address] [place]  [place] [address]

## 2011-02-07 NOTE — Op Note (Signed)
[name]  Patient:    [name], [name]                      MRN: [id number] Proc. Date: 02/21/00 Adm. Date:  [id number] Disc. Date: [id number] Attending:  Katha [name] CC:         [name], M.D., [facility],             [address] [place], [place] [address]                           Operative Report  CCS NUMBER:  [id number]  PREOPERATIVE DIAGNOSIS:  Bilateral inguinal hernias.  POSTOPERATIVE DIAGNOSIS:  Bilateral direct inguinal hernias with some right-sided inguinal adenopathy.  PROCEDURES: 1. Laparoscopic bilateral inguinal hernia repair with mesh. 2. Biopsy of nodal tissue on the right side.  SURGEON:  [name]. [name], M.D.  ASSISTANT:  ANESTHESIA:  General.  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 He was seen and informed consent was obtained was obtained.  He is brought to the OR at this time for laparoscopic bilateral inguinal herniorrhaphy.  DESCRIPTION OF PROCEDURE:  Mr. Mooney was taken to OR #6 on Friday, February 21, 2000, and given general anesthesia.  The abdomen was prepped with Betadine and draped sterilely.  A longitudinal incision was made below the umbilicus.  I slid off to the left side and incised the anterior rectus sheath and then slide the balloon dissector down into that location and insufflated that. With that in place, the balloon dissection occurred under laparoscopic vision and the preperitoneal space was inflated.  This allowed good visualization and demonstrated bilateral direct hernias.  Once this was done and I did have a satisfactory balloon dissection, I then removed that and inserted the cannula and then reinsufflated the preperitoneal space.  There was was no concomitant pneumoperitoneum as it seemed to be in a nice stable preperitoneal space.  Two 5 mm trocars were placed laterally under vision using first the needle for a scout  pass.  With the 5 mm trocars in place, I then dissected first the right side, dissecting the cord structures and found some adenopathy.  I looked like fairly friable inguinal adenopathy.  I took two pieces of this and sent it for permanent sections.  I then dissected out a direct hernia on the right side, as well as a larger direct hernia on the left side.  Both cord structures were skeletonized and no concomitant indirect hernia was seen.  I then cut two 4 x 6 pieces of mesh, placing one on the right first, and anchoring it a little bit beyond the midline and then anteriorly and then down along Coopers ligament and then laterally only where I could feel it.  This covered the direct defect nicely.  A symmetrical piece of mesh was cut and placed and again it overlapped a little bit in the midline, carried it along Coopers ligament anteriorly, well covered the direct defect, and was tacked with one tack laterally to hold it in place.  Again, it was always where I could feel it and was not below the inguinal ligament.  No bleeding was seen.  A good repair was present.  The trocars were removed under vision.  Then the preperitoneal space was deflated.  The fascial defect was closed with a figure-of-eight suture of 0 Vicryl and the skin wounds were closed with  4-0 Vicryl, Benzoin, and Steri-Strips.  The patient tolerated this procedure well and he will be discharged home.  He will be given Mepergan Fortis to take for pain.  He will be followed up in the office in about two to three weeks. DD:  02/21/00 TD:  02/25/00 Job: [id number]

## 2011-02-07 NOTE — Assessment & Plan Note (Signed)
[facility] OFFICE NOTE   NAME:[name], [name]                    MRN:          [id number]  DATE:05/01/2006                            DOB:          1951/07/15    HISTORY OF PRESENT ILLNESS:  This is a 60 year old gentleman here for a  complete physical examination.  In general, he has been feeling fairly well  recently.  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  His  anxiety remains under good control.  Of note, he has a history of melanoma  and we have been getting yearly chest x-ray since then.  Because of a  possible nodule in August 2006, he ended up having a chest CT scan which  showed that his lungs were clear but did show a thyroid nodule.  He ended up  having a thyroid nodule biopsied on January 13, 2006, of this year and benign  reactive tissue was all that was found.  Of note, he has never had a  colonoscopy although we have recommended several times in the past.  Of  note, he was also hospitalized for methicillin-resistant Staphylococcus  aureus in the right anterior thigh in March of this year.  He got IV  vancomycin and was discharged on oral doxicycline which he took for a period  of two months.  He seemed to have recovered well, and he has had no  recurrence that he can tell in the area.  He admits to not getting much  exercise lately.  Otherwise, we have been following him also for GERD,  hyperlipidemia and hypertension.  He does watch his diet fairly closely but  has been off of lipid-lowering medications for quite some time.  Further  details of his past medical history, social history and family history can  be found in physical note dated April 05, 2005.   ALLERGIES:  None.   CURRENT MEDICATIONS:  1. Norvasc 5 mg per day.  2. Prilosec 40 mg per day.  3. Cymbalta 20 mg per day.  4. Nasacort spray  daily.  5. Nabumetone 750 mg two tablets a day.  6. Ativan 0.5 mg b.i.d. as needed.   PHYSICAL EXAMINATION:  VITAL SIGNS:  Height 6 feet 2 inches, weight 194,  blood pressure 128/80, pulse 78 and regular.  GENERAL:  He appears to be doing well.  SKIN:  Free of significant lesions.  EYES:  Clear.  He wears glasses.  Oropharynx is clear.  NECK:  Supple without lymphadenopathy or masses.  LUNGS:  Clear.  CARDIAC:  Regular rate and rhythm.  No murmurs, rubs or gallops.  Pulses are  full.  EKG is within normal limits.  ABDOMEN:  Soft.  Normal bowel sounds.  Nontender.  No masses.  GENITALIA:  Normal male.  EXTREMITIES:  No cyanosis, clubbing or edema.  NEUROLOGIC:  Exam is grossly intact.  SKIN:  His previously infected site in the anterior right thigh looks to be  completely clear at this point.  RECTAL:  Prostate is within normal limits.  No masses or tenderness.  Stool  Hemoccult negative.   LABORATORY DATA:  A PA and lateral chest x-ray today in the clinic is clear.  The patient was here for fasting laboratories on April 24, 2006.  These are  all within normal limits except for his lipid panel.  Triglycerides were  high at 231 LDL is high at 138.   ASSESSMENT AND PLAN:  1. Complete physical exam.  I encouraged him to get regular exercise and      will try to set him up for a colonoscopy soon.  2. Hypertension, stable.  3. Hyperlipidemia.  Will get back on Vytorin 10/20 once a day and check      labs again in 90 days.  4. Anxiety, stable.  5. Gastroesophageal reflux disease, stable.  6. History of melanoma, stable.  7. Benign thyroid nodule.  We will continue to watch this closely.                                     Tera Mater. Clent Ridges, MD   SAF/MedQ  DD:  05/01/2006  DT:  05/02/2006  Job #:  [id number]

## 2011-02-07 NOTE — Discharge Summary (Signed)
NAME:  [name], [name] NO.:  [id number]   MEDICAL RECORD NO.:  [id number]          PATIENT TYPE:  INP   LOCATION:  6739                         FACILITY:  [facility]   PHYSICIAN:  Benn Moulder, M.D.      DATE OF BIRTH:  1951-05-21   DATE OF ADMISSION:  11/20/2005  DATE OF DISCHARGE:  11/21/2005                                 DISCHARGE SUMMARY   DISCHARGE DIAGNOSES:  1.  Abscess status post incision and drainage.  2.  Cellulitis.  3.  Hypertension.  4.  Depression.   DISCHARGE MEDICATIONS:  1.  Doxycycline 100 mg p.o. b.i.d. x 10 days.  2.  Norvasc 5 mg p.o. daily.  3.  Cymbalta 20 mg p.o. daily.  4.  Prilosec one to two times daily as needed.  5.  Ativan 0.5 mg p.r.n. q.h.s.  6.  Bumetine 750 mg two tablets q.h.s.   HISTORY OF PRESENT ILLNESS:  Patient is a 60 year old white male sent from  remote Urgent [facility] for a recent abscess of his right thigh which was  worsening despite being started on Bactrim DS.  Was presumed to be a staph  infection.  On February 28th, the patient had an I&D which expressed minimal  purulent fluid.  Patient was with worsening erythema so he was sent to the  emergency department for admission and IV antibiotics.   HOSPITAL COURSE:  Patient was started on IV vancomycin.  Patient remained  afebrile throughout his hospital course.  He received three doses of  vancomycin.  Patient's abscess and surrounding erythema improved throughout  his hospital stay.  Patient's white blood cell count decreased from 10.3 on  admission to 6.5 at the time of discharge.  Patient will be discharged home  on doxycycline 100 mg p.o. b.i.d. for a 10-day course.  He will follow up  with [facility] on Monday, March 5th.  Patient's other medical  issues remained stable.  His hypertension remained well controlled on his  home dose of Norvasc 5 mg p.o. daily.  Patient's electrolytes were within  normal limits.   LABORATORY DATA:  CBC on admission:  White  blood cell count 10.3, hemoglobin  13.8, hematocrit 39.5, platelets 215.  Basic metabolic panel:  Sodium 139,  potassium 3.7, chloride 107, bicarb 26, BUN 13, creatinine 1.2, glucose 101.   DISCHARGE INSTRUCTIONS:  Patient is to wash the wound out with water three  times a day and keep it covered with dressing, otherwise.  Patient is to  seek medical attention if his wound begins to have worsening erythema or if  he develops fever or systemic complaints.  Otherwise, patient is to follow  up at [facility].   FOLLOWUP ISSUES:  Patient's wound culture, at the time of this discharge,  had no growth to date.  Results of this are still pending.   FOLLOWUP APPOINTMENTS:  [facility] on Monday, November 24, 2005.      Benn Moulder, M.D.     MR/MEDQ  D:  11/21/2005  T:  11/22/2005  Job:  [id number]

## 2011-03-04 ENCOUNTER — Telehealth: Payer: Self-pay | Admitting: *Deleted

## 2011-03-04 MED ORDER — DULOXETINE HCL 20 MG PO CPEP: 20.0000 mg | ORAL_CAPSULE | Freq: Every day | ORAL | Status: DC

## 2011-03-04 NOTE — Telephone Encounter (Signed)
Refill on cymbalta- rx sent to pharmacy

## 2011-04-05 ENCOUNTER — Other Ambulatory Visit: Payer: Self-pay | Admitting: Family Medicine

## 2011-06-03 ENCOUNTER — Telehealth: Payer: Self-pay

## 2011-06-03 NOTE — Telephone Encounter (Signed)
Rx request for cymbalta 20 mg; pt last seen on 12/23/10. Pls advise

## 2011-06-04 NOTE — Telephone Encounter (Signed)
Call in a one year supply  

## 2011-06-05 NOTE — Telephone Encounter (Signed)
Pharmacy called aback and was given info as indicated by Dr Clent Ridges.

## 2011-06-06 ENCOUNTER — Telehealth: Payer: Self-pay | Admitting: Family Medicine

## 2011-06-06 MED ORDER — LORAZEPAM 1 MG PO TABS: 1.0000 mg | ORAL_TABLET | Freq: Four times a day (QID) | ORAL | Status: DC | PRN

## 2011-06-06 NOTE — Telephone Encounter (Signed)
Script called in

## 2011-06-06 NOTE — Telephone Encounter (Signed)
Call in #120 with 5 rf 

## 2011-06-06 NOTE — Telephone Encounter (Signed)
Refill request for Lorazepam 1 mg take 1 po qid prn. Pt last here on 12/23/10 and script last filled on 05/08/11.

## 2011-06-08 ENCOUNTER — Other Ambulatory Visit: Payer: Self-pay | Admitting: Family Medicine

## 2011-06-09 ENCOUNTER — Telehealth: Payer: Self-pay | Admitting: Family Medicine

## 2011-06-09 NOTE — Telephone Encounter (Signed)
Refill Relafen 750mg . Thanks.

## 2011-06-11 MED ORDER — NABUMETONE 750 MG PO TABS: 750.0000 mg | ORAL_TABLET | Freq: Two times a day (BID) | ORAL | Status: DC

## 2011-06-11 NOTE — Telephone Encounter (Signed)
Script sent e-scribe 

## 2011-06-11 NOTE — Telephone Encounter (Signed)
Pharmacy called regarding pt refill said they have been trying since the 13th to get refill, have left a message and faxed several times. Pharmacy requesting refill on Relafen 750mg .  Please send to [facility] [address] [place]

## 2011-07-10 ENCOUNTER — Other Ambulatory Visit: Payer: Self-pay | Admitting: Family Medicine

## 2011-07-11 NOTE — Telephone Encounter (Signed)
done

## 2011-07-20 ENCOUNTER — Other Ambulatory Visit: Payer: Self-pay | Admitting: Family Medicine

## 2011-07-22 NOTE — Telephone Encounter (Signed)
Script sent e-scribe 

## 2011-07-29 DIAGNOSIS — N200 Calculus of kidney: Secondary | ICD-10-CM

## 2011-07-29 HISTORY — DX: Calculus of kidney: N20.0

## 2011-08-03 ENCOUNTER — Other Ambulatory Visit: Payer: Self-pay | Admitting: Family Medicine

## 2011-08-04 ENCOUNTER — Telehealth: Payer: Self-pay | Admitting: Family Medicine

## 2011-08-04 NOTE — Telephone Encounter (Signed)
Refill request for Cyclobenzaprine 10 mg take 1 po tid prn and pt last here on 12/23/10.

## 2011-08-04 NOTE — Telephone Encounter (Signed)
Script sent e-scribe 

## 2011-08-05 ENCOUNTER — Telehealth: Payer: Self-pay | Admitting: Family Medicine

## 2011-08-05 MED ORDER — CYCLOBENZAPRINE HCL 10 MG PO TABS: 10.0000 mg | ORAL_TABLET | Freq: Three times a day (TID) | ORAL | Status: DC | PRN

## 2011-08-05 NOTE — Telephone Encounter (Signed)
Call in #[phone] with 5 rf 

## 2011-08-05 NOTE — Telephone Encounter (Signed)
I had already placed a order for this.

## 2011-08-05 NOTE — Telephone Encounter (Signed)
Script sent e-scribe 

## 2011-08-13 ENCOUNTER — Encounter: Payer: Self-pay | Admitting: Family Medicine

## 2011-08-13 ENCOUNTER — Ambulatory Visit (INDEPENDENT_AMBULATORY_CARE_PROVIDER_SITE_OTHER): Payer: BC Managed Care – PPO | Admitting: Family Medicine

## 2011-08-13 VITALS — BP 130/88 | HR 91 | Temp 97.6°F | Wt 191.0 lb

## 2011-08-13 DIAGNOSIS — L570 Actinic keratosis: Secondary | ICD-10-CM

## 2011-08-13 DIAGNOSIS — N419 Inflammatory disease of prostate, unspecified: Secondary | ICD-10-CM

## 2011-08-13 DIAGNOSIS — Z23 Encounter for immunization: Secondary | ICD-10-CM

## 2011-08-13 DIAGNOSIS — N200 Calculus of kidney: Secondary | ICD-10-CM

## 2011-08-13 LAB — POCT URINALYSIS DIPSTICK
Blood, UA: NEGATIVE
Glucose, UA: NEGATIVE
Spec Grav, UA: 1.01
Urobilinogen, UA: 0.2

## 2011-08-13 NOTE — Progress Notes (Signed)
  Subjective:    Patient ID: [name], male    DOB: [birth year], 60 y.o.   MRN: [id number]  HPI Here for several issues. He was treated for a prostate infection several weeks ago at [facility] with 10 days of Cipro. He had presented with urinary pressure and burning, but these have resolved. He passed his first kidney stone on 07-29-11 while in the [facility] ER. A CT scan showed he has no residual stones. Now he has no urinary symptoms but he asks to be checked. Also he asks about a nontender bump on his scalp he felt last week. He sees Dr. Sharyn Lull once a year for skin exams.    Review of Systems  Constitutional: Negative.   Respiratory: Negative.   Cardiovascular: Negative.   Gastrointestinal: Negative.   Genitourinary: Negative.        Objective:   Physical Exam  Constitutional: He appears well-developed and well-nourished.  Abdominal: Soft. Bowel sounds are normal. He exhibits no distension and no mass. There is no tenderness. There is no rebound and no guarding.  Genitourinary: Prostate normal.  Skin:       Small red scaly lesion on top of the scalp          Assessment & Plan:  He has another actinic keratosis, and I advised him to see Dr. Sharyn Lull to get this removed. The prostatitis has resolved.

## 2011-08-13 NOTE — Progress Notes (Signed)
Addended by: Aniceto Boss A on: 08/13/2011 02:21 PM   Modules accepted: Orders

## 2011-08-13 NOTE — Progress Notes (Signed)
Addended by: Aniceto Boss A on: 08/13/2011 03:39 PM   Modules accepted: Orders

## 2011-08-15 ENCOUNTER — Telehealth: Payer: Self-pay | Admitting: Family Medicine

## 2011-08-15 NOTE — Telephone Encounter (Signed)
Pt called WU:[id number]. Pt was in to see Dr Clent Ridges on 08/13/11 and forgot to ask about the possibility of getting the Lisinopril without the HCTZ, due to possible enlarged prostate and other health issues. Pt is req a call back from Dr Carver Fila nurse on Monday 08/18/11.

## 2011-08-19 MED ORDER — LISINOPRIL 20 MG PO TABS: 20.0000 mg | ORAL_TABLET | Freq: Every day | ORAL | Status: DC

## 2011-08-19 NOTE — Telephone Encounter (Signed)
Left a message for pt to return call.  Rx for lisinopril 20 mg sent to pharmacy.

## 2011-08-19 NOTE — Telephone Encounter (Signed)
Okay we can try this. Stop the Lisinopril HCT, and start on Lisinopril 20 mg a day. Call in one year supply

## 2011-08-21 NOTE — Telephone Encounter (Signed)
I left voice message for pt to return my call. 

## 2011-08-21 NOTE — Telephone Encounter (Signed)
Pt did call back and he will pick up the new script.

## 2011-10-27 ENCOUNTER — Telehealth: Payer: Self-pay | Admitting: Family Medicine

## 2011-10-27 DIAGNOSIS — Z0000 Encounter for general adult medical examination without abnormal findings: Secondary | ICD-10-CM

## 2011-10-27 NOTE — Telephone Encounter (Signed)
done

## 2011-10-27 NOTE — Telephone Encounter (Signed)
Has cpx labs--v70.0. On 12-18-2011. Would like to add HIV labs added. Thanks.

## 2011-11-21 ENCOUNTER — Telehealth: Payer: Self-pay | Admitting: Family Medicine

## 2011-11-21 NOTE — Telephone Encounter (Signed)
Refill request for Lorazepam 1 mg take 1 po qid prn and pt last here on 10/12/10.

## 2011-11-24 MED ORDER — LORAZEPAM 1 MG PO TABS: 1.0000 mg | ORAL_TABLET | Freq: Four times a day (QID) | ORAL | Status: DC | PRN

## 2011-11-24 NOTE — Telephone Encounter (Signed)
Call in #120 with 5 rf 

## 2011-11-24 NOTE — Telephone Encounter (Signed)
Script called in

## 2011-12-17 ENCOUNTER — Encounter: Payer: Self-pay | Admitting: Family Medicine

## 2011-12-17 ENCOUNTER — Ambulatory Visit (INDEPENDENT_AMBULATORY_CARE_PROVIDER_SITE_OTHER): Payer: BC Managed Care – PPO | Admitting: Family Medicine

## 2011-12-17 VITALS — BP 140/84 | HR 89 | Temp 98.9°F | Wt 195.0 lb

## 2011-12-17 DIAGNOSIS — J329 Chronic sinusitis, unspecified: Secondary | ICD-10-CM

## 2011-12-17 MED ORDER — AZITHROMYCIN 250 MG PO TABS: ORAL_TABLET | ORAL | Status: AC

## 2011-12-17 NOTE — Progress Notes (Signed)
  Subjective:    Patient ID: [name], male    DOB: 1951-01-15, 61 y.o.   MRN: [id number]  HPI Here for 4 weeks of recurrent sinus pressure, PND, ST, and dry cough. No fever.   Review of Systems  Constitutional: Negative.   HENT: Positive for congestion, postnasal drip and sinus pressure.   Eyes: Negative.   Respiratory: Positive for cough.        Objective:   Physical Exam  Constitutional: He appears well-developed and well-nourished.  HENT:  Right Ear: External ear normal.  Left Ear: External ear normal.  Nose: Nose normal.  Mouth/Throat: Oropharynx is clear and moist. No oropharyngeal exudate.  Eyes: Conjunctivae are normal.  Neck: Neck supple.  Pulmonary/Chest: Effort normal and breath sounds normal.  Lymphadenopathy:    He has no cervical adenopathy.          Assessment & Plan:  Add Mucinex

## 2011-12-18 ENCOUNTER — Other Ambulatory Visit (INDEPENDENT_AMBULATORY_CARE_PROVIDER_SITE_OTHER): Payer: BC Managed Care – PPO

## 2011-12-18 DIAGNOSIS — Z0000 Encounter for general adult medical examination without abnormal findings: Secondary | ICD-10-CM

## 2011-12-18 LAB — CBC WITH DIFFERENTIAL/PLATELET
Basophils Absolute: 0 10*3/uL (ref 0.0–0.1)
Eosinophils Absolute: 0.1 10*3/uL (ref 0.0–0.7)
Lymphocytes Relative: 20.5 % (ref 12.0–46.0)
MCHC: 34.6 g/dL (ref 30.0–36.0)
Neutro Abs: 2.7 10*3/uL (ref 1.4–7.7)
Neutrophils Relative %: 57.8 % (ref 43.0–77.0)
Platelets: 157 10*3/uL (ref 150.0–400.0)
RDW: 13.6 % (ref 11.5–14.6)

## 2011-12-18 LAB — POCT URINALYSIS DIPSTICK
Blood, UA: NEGATIVE
Glucose, UA: NEGATIVE
Spec Grav, UA: 1.01
Urobilinogen, UA: 0.2

## 2011-12-18 LAB — BASIC METABOLIC PANEL
BUN: 18 mg/dL (ref 6–23)
CO2: 29 mEq/L (ref 19–32)
Calcium: 9.2 mg/dL (ref 8.4–10.5)
Creatinine, Ser: 1.2 mg/dL (ref 0.4–1.5)
Glucose, Bld: 102 mg/dL — ABNORMAL HIGH (ref 70–99)

## 2011-12-18 LAB — LIPID PANEL
HDL: 53.5 mg/dL (ref >39.00)
VLDL: 23.6 mg/dL (ref 0.0–40.0)

## 2011-12-18 LAB — HEPATIC FUNCTION PANEL
Albumin: 4 g/dL (ref 3.5–5.2)
Alkaline Phosphatase: 37 U/L — ABNORMAL LOW (ref 39–117)
Bilirubin, Direct: 0.1 mg/dL (ref 0.0–0.3)

## 2011-12-18 LAB — TSH: TSH: 1.45 u[IU]/mL (ref 0.35–5.50)

## 2011-12-29 ENCOUNTER — Ambulatory Visit (INDEPENDENT_AMBULATORY_CARE_PROVIDER_SITE_OTHER)
Admission: RE | Admit: 2011-12-29 | Discharge: 2011-12-29 | Disposition: A | Discharge status: Home / Self Care | Payer: BC Managed Care – PPO | Source: Ambulatory Visit | Attending: Family Medicine | Admitting: Family Medicine

## 2011-12-29 ENCOUNTER — Ambulatory Visit (INDEPENDENT_AMBULATORY_CARE_PROVIDER_SITE_OTHER): Payer: BC Managed Care – PPO | Admitting: Family Medicine

## 2011-12-29 ENCOUNTER — Encounter: Payer: Self-pay | Admitting: Family Medicine

## 2011-12-29 VITALS — BP 128/84 | HR 94 | Temp 98.2°F | Ht 73.0 in | Wt 194.0 lb

## 2011-12-29 DIAGNOSIS — Z85820 Personal history of malignant melanoma of skin: Secondary | ICD-10-CM

## 2011-12-29 DIAGNOSIS — Z0000 Encounter for general adult medical examination without abnormal findings: Secondary | ICD-10-CM

## 2011-12-29 NOTE — Progress Notes (Signed)
  Subjective:    Patient ID: [name], male    DOB: [birth year], 61 y.o.   MRN: [id number]  HPI 61 yr old male for a cpx. He feels fine with no concerns.    Review of Systems  Constitutional: Negative.   HENT: Negative.   Eyes: Negative.   Respiratory: Negative.   Cardiovascular: Negative.   Gastrointestinal: Negative.   Genitourinary: Negative.   Musculoskeletal: Negative.   Skin: Negative.   Neurological: Negative.   Hematological: Negative.   Psychiatric/Behavioral: Negative.        Objective:   Physical Exam  Constitutional: He is oriented to person, place, and time. He appears well-developed and well-nourished. No distress.  HENT:  Head: Normocephalic and atraumatic.  Right Ear: External ear normal.  Left Ear: External ear normal.  Nose: Nose normal.  Mouth/Throat: Oropharynx is clear and moist. No oropharyngeal exudate.  Eyes: Conjunctivae and EOM are normal. Pupils are equal, round, and reactive to light. Right eye exhibits no discharge. Left eye exhibits no discharge. No scleral icterus.  Neck: Neck supple. No JVD present. No tracheal deviation present. No thyromegaly present.  Cardiovascular: Normal rate, regular rhythm, normal heart sounds and intact distal pulses.  Exam reveals no gallop and no friction rub.   No murmur heard.      EKG normal   Pulmonary/Chest: Effort normal and breath sounds normal. No respiratory distress. He has no wheezes. He has no rales. He exhibits no tenderness.  Abdominal: Soft. Bowel sounds are normal. He exhibits no distension and no mass. There is no tenderness. There is no rebound and no guarding.  Genitourinary: Rectum normal, prostate normal and penis normal. Guaiac negative stool. No penile tenderness.  Musculoskeletal: Normal range of motion. He exhibits no edema and no tenderness.  Lymphadenopathy:    He has no cervical adenopathy.  Neurological: He is alert and oriented to person, place, and time. He has normal  reflexes. No cranial nerve deficit. He exhibits normal muscle tone. Coordination normal.  Skin: Skin is warm and dry. No rash noted. He is not diaphoretic. No erythema. No pallor.  Psychiatric: He has a normal mood and affect. His behavior is normal. Judgment and thought content normal.          Assessment & Plan:  Try to get more exercise. Get a CXR today

## 2011-12-31 NOTE — Progress Notes (Signed)
Quick Note:  Spoke with pt ______ 

## 2012-05-03 ENCOUNTER — Other Ambulatory Visit: Payer: Self-pay | Admitting: Family Medicine

## 2012-05-19 ENCOUNTER — Telehealth: Payer: Self-pay | Admitting: Family Medicine

## 2012-05-19 NOTE — Telephone Encounter (Signed)
Cal in #120 with 5 rf 

## 2012-05-19 NOTE — Telephone Encounter (Signed)
Refill request for Lorazepam 1 mg take 1 po qid prn and last here on 12/29/11.

## 2012-05-20 MED ORDER — LORAZEPAM 1 MG PO TABS: 1.0000 mg | ORAL_TABLET | Freq: Four times a day (QID) | ORAL | Status: DC | PRN

## 2012-05-20 NOTE — Telephone Encounter (Signed)
Rx sent to pharmacy   

## 2012-06-08 ENCOUNTER — Ambulatory Visit (INDEPENDENT_AMBULATORY_CARE_PROVIDER_SITE_OTHER): Payer: BC Managed Care – PPO | Admitting: Family Medicine

## 2012-06-08 ENCOUNTER — Encounter: Payer: Self-pay | Admitting: Family Medicine

## 2012-06-08 VITALS — BP 132/90 | HR 84 | Temp 98.5°F | Wt 196.0 lb

## 2012-06-08 DIAGNOSIS — F341 Dysthymic disorder: Secondary | ICD-10-CM

## 2012-06-08 DIAGNOSIS — B356 Tinea cruris: Secondary | ICD-10-CM

## 2012-06-08 DIAGNOSIS — F419 Anxiety disorder, unspecified: Secondary | ICD-10-CM

## 2012-06-08 DIAGNOSIS — Z23 Encounter for immunization: Secondary | ICD-10-CM

## 2012-06-08 DIAGNOSIS — F32A Depression, unspecified: Secondary | ICD-10-CM

## 2012-06-08 DIAGNOSIS — F329 Major depressive disorder, single episode, unspecified: Secondary | ICD-10-CM

## 2012-06-08 MED ORDER — KETOCONAZOLE 2 % EX CREA: TOPICAL_CREAM | Freq: Two times a day (BID) | CUTANEOUS | Status: DC

## 2012-06-08 MED ORDER — SILDENAFIL CITRATE 50 MG PO TABS: 50.0000 mg | ORAL_TABLET | Freq: Every day | ORAL | Status: DC | PRN

## 2012-06-08 NOTE — Progress Notes (Signed)
  Subjective:    Patient ID: [name], male    DOB: 1951/05/31, 61 y.o.   MRN: [id number]  HPI Here for several issues. First he is interested in weaning off Cymbalta since he has retired and has less stress in his life. Also he has had an irritated rash in the groins and around the anus for 3 weeks.    Review of Systems  Constitutional: Negative.   Skin: Positive for rash.  Psychiatric/Behavioral: Positive for dysphoric mood. Negative for confusion, decreased concentration and agitation. The patient is nervous/anxious.        Objective:   Physical Exam  Constitutional: He appears well-developed and well-nourished.  Genitourinary:       Macular red rash throughout the perineum from the scrotum to the anus  Psychiatric: He has a normal mood and affect. His behavior is normal. Thought content normal.          Assessment & Plan:  Treat the tinea with ketoconazole. Advised him to take Cymbalta every 2 days for 2 weeks, then every 3 days fpr 2 weeks , and then stop it.

## 2012-06-16 ENCOUNTER — Other Ambulatory Visit: Payer: Self-pay | Admitting: Family Medicine

## 2012-07-30 ENCOUNTER — Other Ambulatory Visit: Payer: Self-pay | Admitting: Family Medicine

## 2012-08-13 ENCOUNTER — Encounter: Payer: Self-pay | Admitting: Family Medicine

## 2012-08-13 ENCOUNTER — Ambulatory Visit (INDEPENDENT_AMBULATORY_CARE_PROVIDER_SITE_OTHER): Payer: BC Managed Care – PPO | Admitting: Family Medicine

## 2012-08-13 VITALS — BP 150/92 | HR 91 | Temp 98.5°F | Wt 197.0 lb

## 2012-08-13 DIAGNOSIS — J329 Chronic sinusitis, unspecified: Secondary | ICD-10-CM

## 2012-08-13 MED ORDER — AZITHROMYCIN 250 MG PO TABS: ORAL_TABLET | ORAL | Status: DC

## 2012-08-13 NOTE — Progress Notes (Signed)
  Subjective:    Patient ID: [name], male    DOB: 19-Dec-1950, 61 y.o.   MRN: [id number]  HPI Here for 3 days of sinus pressure, PND, ST, and a dry cough. No fever.    Review of Systems  Constitutional: Negative.   HENT: Positive for congestion, postnasal drip and sinus pressure. Negative for nosebleeds.   Eyes: Negative.   Respiratory: Positive for cough.        Objective:   Physical Exam  Constitutional: He appears well-developed and well-nourished.  HENT:  Right Ear: External ear normal.  Left Ear: External ear normal.  Mouth/Throat: Oropharynx is clear and moist.  Eyes: Conjunctivae normal are normal.  Pulmonary/Chest: Effort normal and breath sounds normal.  Lymphadenopathy:    He has no cervical adenopathy.          Assessment & Plan:  Add Mucinex

## 2012-08-15 ENCOUNTER — Other Ambulatory Visit: Payer: Self-pay | Admitting: Family Medicine

## 2012-09-11 ENCOUNTER — Other Ambulatory Visit: Payer: Self-pay | Admitting: Family Medicine

## 2012-09-24 ENCOUNTER — Telehealth: Payer: Self-pay | Admitting: Family Medicine

## 2012-09-24 NOTE — Telephone Encounter (Signed)
Received a call back message from [name]. Attempted to return call . No answer. Left voicemail to please return call to the office for assistance.

## 2012-09-27 ENCOUNTER — Ambulatory Visit (INDEPENDENT_AMBULATORY_CARE_PROVIDER_SITE_OTHER): Payer: BC Managed Care – PPO | Admitting: Family Medicine

## 2012-09-27 ENCOUNTER — Encounter: Payer: Self-pay | Admitting: Family Medicine

## 2012-09-27 VITALS — BP 142/86 | HR 94 | Temp 98.1°F | Wt 196.0 lb

## 2012-09-27 DIAGNOSIS — H8110 Benign paroxysmal vertigo, unspecified ear: Secondary | ICD-10-CM

## 2012-09-27 NOTE — Progress Notes (Signed)
  Subjective:    Patient ID: [name], male    DOB: 1951/05/02, 62 y.o.   MRN: [id number]  HPI Here for several weeks of intermittent dizziness which he describes as the room spinning around him. This happens when he moves his head suddenly or when he gets up out of bed. It lasts only a few seconds and then goes away. No other problems.    Review of Systems  Constitutional: Negative.   HENT: Negative.   Eyes: Negative.   Respiratory: Negative.   Cardiovascular: Negative.   Neurological: Positive for dizziness. Negative for tremors, seizures, syncope, facial asymmetry, speech difficulty, weakness, light-headedness, numbness and headaches.       Objective:   Physical Exam  Constitutional: He is oriented to person, place, and time. He appears well-developed and well-nourished.  Eyes: Conjunctivae normal and EOM are normal.  Neck: No thyromegaly present.  Cardiovascular: Normal rate, regular rhythm, normal heart sounds and intact distal pulses.   Pulmonary/Chest: Effort normal and breath sounds normal.  Lymphadenopathy:    He has no cervical adenopathy.  Neurological: He is alert and oriented to person, place, and time. He has normal reflexes. No cranial nerve deficit. He exhibits normal muscle tone. Coordination normal.          Assessment & Plan:  This is benign vertigo. Try Claritin and Mucinex daily for a week or two. Recheck prn

## 2012-09-28 ENCOUNTER — Telehealth: Payer: Self-pay | Admitting: Family Medicine

## 2012-09-28 NOTE — Telephone Encounter (Signed)
Call in Zyrtec 10 mg daily, #30 with 11 rf

## 2012-09-28 NOTE — Telephone Encounter (Signed)
Patient called stating that he was told by the MD to take zyrtec or claritin and the pharmacy wants him to have an rx for that. [facility] into [facility] [address] patient prefer to have zyrtec. Please assist and inform patient when done.

## 2012-09-29 ENCOUNTER — Telehealth: Payer: Self-pay | Admitting: Family Medicine

## 2012-09-29 MED ORDER — CETIRIZINE HCL 10 MG PO TABS: 10.0000 mg | ORAL_TABLET | Freq: Every day | ORAL | Status: DC

## 2012-09-29 NOTE — Telephone Encounter (Signed)
I sent script e-scribe and left voice message for pt 

## 2012-09-29 NOTE — Telephone Encounter (Signed)
Pt was here Monday. Dr. Clent Ridges recommended that he take Mucinex and combo w/Zyrtec or Claritin. When he tried to buy the Zyrtec at the pharm from behind the counter, they denied it - said his license would not go through for it to be approved. Pharmacy said they would call us and get Dr. Clent Ridges to write a rx for the Zyrtec, so pt could get it.  I see Dr. Clent Ridges approved Zyertec, but it is not done. Pt calling bac k - is feeling poorly, and would like it called in ASAP. Thanks.

## 2012-10-13 ENCOUNTER — Ambulatory Visit: Payer: BC Managed Care – PPO | Admitting: Family Medicine

## 2012-10-18 ENCOUNTER — Ambulatory Visit (INDEPENDENT_AMBULATORY_CARE_PROVIDER_SITE_OTHER): Payer: BC Managed Care – PPO | Admitting: Family Medicine

## 2012-10-18 ENCOUNTER — Encounter: Payer: Self-pay | Admitting: Family Medicine

## 2012-10-18 VITALS — BP 132/88 | HR 90 | Temp 98.3°F | Wt 194.0 lb

## 2012-10-18 DIAGNOSIS — F411 Generalized anxiety disorder: Secondary | ICD-10-CM

## 2012-10-18 DIAGNOSIS — I10 Essential (primary) hypertension: Secondary | ICD-10-CM

## 2012-10-18 DIAGNOSIS — R42 Dizziness and giddiness: Secondary | ICD-10-CM

## 2012-10-18 DIAGNOSIS — E785 Hyperlipidemia, unspecified: Secondary | ICD-10-CM

## 2012-10-18 NOTE — Progress Notes (Signed)
  Subjective:    Patient ID: [name], male    DOB: 1951-04-18, 62 y.o.   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 He is doing well except for some slight traces of vertigo still.   Review of Systems  Constitutional: Negative.   Respiratory: Negative.   Cardiovascular: Negative.        Objective:   Physical Exam  Constitutional: He appears well-developed and well-nourished.  Neck: No thyromegaly present.  Cardiovascular: Normal rate, regular rhythm, normal heart sounds and intact distal pulses.   Pulmonary/Chest: Effort normal and breath sounds normal.  Lymphadenopathy:    He has no cervical adenopathy.          Assessment & Plan:  He is basically healthy, and the form was filled out.

## 2012-11-11 ENCOUNTER — Other Ambulatory Visit: Payer: Self-pay | Admitting: Family Medicine

## 2012-11-11 NOTE — Telephone Encounter (Signed)
Call in #120 with 5 rf 

## 2012-11-22 ENCOUNTER — Ambulatory Visit: Payer: BC Managed Care – PPO | Admitting: Family Medicine

## 2012-11-23 ENCOUNTER — Ambulatory Visit (INDEPENDENT_AMBULATORY_CARE_PROVIDER_SITE_OTHER): Payer: BC Managed Care – PPO | Admitting: Family Medicine

## 2012-11-23 ENCOUNTER — Encounter: Payer: Self-pay | Admitting: Family Medicine

## 2012-11-23 VITALS — BP 140/90 | HR 92 | Temp 98.3°F | Wt 194.0 lb

## 2012-11-23 DIAGNOSIS — J019 Acute sinusitis, unspecified: Secondary | ICD-10-CM

## 2012-11-23 MED ORDER — AZITHROMYCIN 250 MG PO TABS: ORAL_TABLET | ORAL | Status: DC

## 2012-11-23 NOTE — Progress Notes (Signed)
  Subjective:    Patient ID: [name], male    DOB: [birth year], 62 y.o.   MRN: [id number]  HPI Here for one week of sinus pressure, PND, and coughing up green sputum. No fever. On Mucinex D   Review of Systems  Constitutional: Negative.   HENT: Positive for congestion, postnasal drip and sinus pressure.   Eyes: Negative.   Respiratory: Positive for cough.        Objective:   Physical Exam  Constitutional: He appears well-developed and well-nourished.  HENT:  Right Ear: External ear normal.  Left Ear: External ear normal.  Nose: Nose normal.  Mouth/Throat: Oropharynx is clear and moist.  Eyes: Conjunctivae are normal.  Pulmonary/Chest: Effort normal and breath sounds normal.  Lymphadenopathy:    He has no cervical adenopathy.          Assessment & Plan:  Recheck prn

## 2012-12-21 ENCOUNTER — Other Ambulatory Visit: Payer: Self-pay | Admitting: Family Medicine

## 2013-01-16 ENCOUNTER — Other Ambulatory Visit: Payer: Self-pay | Admitting: Family Medicine

## 2013-04-04 ENCOUNTER — Ambulatory Visit (INDEPENDENT_AMBULATORY_CARE_PROVIDER_SITE_OTHER): Payer: BC Managed Care – PPO | Admitting: Internal Medicine

## 2013-04-04 ENCOUNTER — Telehealth: Payer: Self-pay | Admitting: Family Medicine

## 2013-04-04 ENCOUNTER — Encounter: Payer: Self-pay | Admitting: Internal Medicine

## 2013-04-04 VITALS — BP 150/100 | HR 77 | Temp 98.2°F | Wt 191.0 lb

## 2013-04-04 DIAGNOSIS — H43392 Other vitreous opacities, left eye: Secondary | ICD-10-CM

## 2013-04-04 DIAGNOSIS — I10 Essential (primary) hypertension: Secondary | ICD-10-CM

## 2013-04-04 DIAGNOSIS — H5310 Unspecified subjective visual disturbances: Secondary | ICD-10-CM | POA: Insufficient documentation

## 2013-04-04 DIAGNOSIS — H43399 Other vitreous opacities, unspecified eye: Secondary | ICD-10-CM | POA: Insufficient documentation

## 2013-04-04 MED ORDER — LISINOPRIL-HYDROCHLOROTHIAZIDE 20-12.5 MG PO TABS: 1.0000 | ORAL_TABLET | Freq: Every day | ORAL | Status: DC

## 2013-04-04 NOTE — Telephone Encounter (Signed)
Pt would like to add HIV test to his CPX labs on Wed, 7/16. Is that OK?

## 2013-04-04 NOTE — Telephone Encounter (Signed)
Patient Information:  Caller Name: [place]  Phone: [phone]  Patient: [name], [name]  Gender: Male  DOB: Dec 16, 1950  Age: 62 Years  PCP: Gershon [facility])  Office Follow Up:  Does the office need to follow up with this patient?: No  Instructions For The Office: N/A  RN Note:  No eye pain, discharge or redness. Wears glasses.  Advised to see MD within 4 hours for sudden appearance of many floaters and spots per Eye Pain or Vision change guideline.  Symptoms  Reason For Call & Symptoms: Floating, squiggly black lines and dots in left eye with sensation of irritation.  LIne moves when blinks or moves eye.  Reviewed Health History In EMR: Yes  Reviewed Medications In EMR: Yes  Reviewed Allergies In EMR: Yes  Reviewed Surgeries / Procedures: Yes  Date of Onset of Symptoms: 03/26/2013  Guideline(s) Used:  Eye - Red Without Pus  No Protocol Available - Information Only  Disposition Per Guideline:   Callback by PCP Today  Reason For Disposition Reached:   Nursing judgment  Advice Given:  Call Back If:  New symptoms develop  You become worse.  RN Overrode Recommendation:  Make Appointment  Advised to see MD within 4 hours.  Appointment Scheduled:  04/04/2013 15:30:00 Appointment Scheduled Provider:  Berniece Andreas [facility])

## 2013-04-04 NOTE — Patient Instructions (Addendum)
I  think  Your blood pressure needs better control.   You should see an ophthalmologist and will do a referral but call also as you are a member of [facility].

## 2013-04-04 NOTE — Telephone Encounter (Signed)
Pt is on schedule to see Dr. Fabian Sharp on 04/04/13.

## 2013-04-04 NOTE — Progress Notes (Signed)
Chief Complaint  Patient presents with  . Vision floaters    HPI: Patient comes in today for SDA for  new problem evaluation. PCP not available Onset  3 days ago  With left vision change  somethin in line of vision like a worm structure   And thought it could be a  Piece of lint even cut back eye brows to see if in way  Persists  Like a worm  That floats  Left corner of left eye.  Moves with eye moving.   One or 2 black dots there  Sometimes changes .  No pain or   Impaired vision. Otherwise  Generally well otherwise  Had annyal eye exxam .     Eye doc divensky   [facility] .    In June. He called his vision benefits plan and told him they need to see his regular doctor as they will pay for an eye visit.  No unusual headaches or history of same. He does wear glasses.  In regard to blood pressure it does tend to trend up in the past he was on lisinopril HCTZ there was some question of swelling but that was really related to an infection and not a reaction to medication. Uncertain if he's been on other medicines. This time he is retired or standing long periods of time is not an issue as it was in the past.  He is due for a checkup visit with Dr. Clent Ridges in the fall  [facility]. ROS: See pertinent positives and negatives per HPI. No numbness weakness syncope total loss of vision cardiovascular symptoms.  Past Medical History  Diagnosis Date  . Anxiety   . GERD (gastroesophageal reflux disease)   . Hyperlipidemia   . Hypertension   . ED (erectile dysfunction)   . Kidney stone 07-29-11    passed     Family History  Problem Relation Age of Onset  . Depression    . Diabetes    . Hyperlipidemia    . Hypertension    . Alzheimer's disease      History   Social History  . Marital Status: Single    Spouse Name: N/A    Number of Children: N/A  . Years of Education: N/A   Social History Main Topics  . Smoking status: Never Smoker   . Smokeless tobacco: Never  Used  . Alcohol Use: No  . Drug Use: No  . Sexually Active: None   Other Topics Concern  . None   Social History Narrative  . None    Outpatient Encounter Prescriptions as of 04/04/2013  Medication Sig Dispense Refill  . acetaminophen (TYLENOL) 500 MG tablet Take 500 mg by mouth every 6 (six) hours as needed.      . cyclobenzaprine (FLEXERIL) 10 MG tablet TAKE 1 TABLET (10 MG TOTAL) BY MOUTH 3 (THREE) TIMES DAILY AS NEEDED.  60 tablet  3  . DULoxetine (CYMBALTA) 20 MG capsule       . lisinopril (PRINIVIL,ZESTRIL) 20 MG tablet TAKE 1 TABLET (20 MG TOTAL) BY MOUTH DAILY.  30 tablet  3  . LORazepam (ATIVAN) 1 MG tablet TAKE 1 TABLET BY MOUTH 4 TIMES A DAY AS NEEDED FOR ANXIETY  120 tablet  5  . nabumetone (RELAFEN) 750 MG tablet Take 750 mg by mouth daily.       Marland Kitchen omeprazole (PRILOSEC OTC) 20 MG tablet as needed.       Marland Kitchen lisinopril-hydrochlorothiazide (ZESTORETIC) 20-12.5 MG  per tablet Take 1 tablet by mouth daily.  90 tablet  1  . [DISCONTINUED] azithromycin (ZITHROMAX) 250 MG tablet As directed  6 tablet  0   No facility-administered encounter medications on file as of 04/04/2013.    EXAM:  BP 150/100  Pulse 77  Temp(Src) 98.2 F (36.8 C) (Oral)  Wt 191 lb (86.637 kg)  BMI 25.2 kg/m2  SpO2 98%  Body mass index is 25.2 kg/(m^2).  GENERAL: vitals reviewed and listed above, alert, oriented, appears well hydrated and in no acute distress  HEENT: atraumatic, conjunctiva  clear, no obvious abnormalities on inspection of external nose and ears OP : no lesion edema or exudate eyes EOMs full PERRLA don't see a foreign body. There is no redness noted. Funduscopic discs look sharp and blood vessels clear. No obvious deformity. NECK: no obvious masses on inspection palpation no bruits are heard LUNGS: clear to auscultation bilaterally, no wheezes, rales or rhonchi,  CV: HRRR, no clubbing cyanosis or  peripheral edema nl cap refill  MS: moves all extremities without noticeable focal   abnormality Neuro appears grossly intact. PSYCH: pleasant and cooperative,   ASSESSMENT AND PLAN:  Discussed the following assessment and plan:  Visual floaters, left - No real loss of vision but change sounds I related - Plan: Ambulatory referral to Ophthalmology  Subjective vision disturbance, left - Plan: Ambulatory referral to Ophthalmology  HYPERTENSION - Apparently elevated for a while. Go back to try lisinopril HCTZ and followup with Dr. Clent Ridges in a couple months Advice he see an ophthalmologist we looked up on line [place] and his optometrist is fair with other ophthalmologist he will call tomorrow about trying to get in and we will do a referral the meantime to help facilitate inset our notes.  -Patient advised to return or notify health care team  if symptoms worsen or persist or new concerns arise.  Patient Instructions  I  think  Your blood pressure needs better control.   You should see an ophthalmologist and will do a referral but call also as you are a member of [facility].     Neta Mends. Michai Dieppa M.D.

## 2013-04-05 NOTE — Telephone Encounter (Signed)
I spoke with pt and he will discuss this during his office visit.

## 2013-04-06 ENCOUNTER — Other Ambulatory Visit (INDEPENDENT_AMBULATORY_CARE_PROVIDER_SITE_OTHER): Payer: BC Managed Care – PPO

## 2013-04-06 DIAGNOSIS — R7989 Other specified abnormal findings of blood chemistry: Secondary | ICD-10-CM

## 2013-04-06 DIAGNOSIS — Z0000 Encounter for general adult medical examination without abnormal findings: Secondary | ICD-10-CM

## 2013-04-06 LAB — CBC WITH DIFFERENTIAL/PLATELET
Eosinophils Relative: 1.3 % (ref 0.0–5.0)
HCT: 42.4 % (ref 39.0–52.0)
Lymphocytes Relative: 25.7 % (ref 12.0–46.0)
Monocytes Relative: 9.1 % (ref 3.0–12.0)
Neutrophils Relative %: 63.1 % (ref 43.0–77.0)
Platelets: 187 10*3/uL (ref 150.0–400.0)
WBC: 5.6 10*3/uL (ref 4.5–10.5)

## 2013-04-06 LAB — BASIC METABOLIC PANEL
BUN: 23 mg/dL (ref 6–23)
Creatinine, Ser: 1.2 mg/dL (ref 0.4–1.5)
GFR: 68.36 mL/min (ref >60.00)
Potassium: 5 mEq/L (ref 3.5–5.1)

## 2013-04-06 LAB — LIPID PANEL
HDL: 51 mg/dL (ref >39.00)
Total CHOL/HDL Ratio: 4
Triglycerides: 151 mg/dL — ABNORMAL HIGH (ref 0.0–149.0)

## 2013-04-06 LAB — HEPATIC FUNCTION PANEL
ALT: 25 U/L (ref 0–53)
AST: 24 U/L (ref 0–37)
Alkaline Phosphatase: 39 U/L (ref 39–117)
Bilirubin, Direct: 0.1 mg/dL (ref 0.0–0.3)
Total Bilirubin: 0.7 mg/dL (ref 0.3–1.2)

## 2013-04-06 LAB — LDL CHOLESTEROL, DIRECT: Direct LDL: 141.9 mg/dL

## 2013-04-06 LAB — POCT URINALYSIS DIPSTICK
Bilirubin, UA: NEGATIVE
Blood, UA: NEGATIVE
Glucose, UA: NEGATIVE
Spec Grav, UA: 1.01

## 2013-04-11 NOTE — Progress Notes (Signed)
Quick Note:  I spoke with pt ______ 

## 2013-04-14 ENCOUNTER — Telehealth: Payer: Self-pay | Admitting: Family Medicine

## 2013-04-14 ENCOUNTER — Ambulatory Visit (INDEPENDENT_AMBULATORY_CARE_PROVIDER_SITE_OTHER): Payer: BC Managed Care – PPO | Admitting: Family Medicine

## 2013-04-14 ENCOUNTER — Encounter: Payer: Self-pay | Admitting: Family Medicine

## 2013-04-14 VITALS — BP 156/90 | HR 95 | Temp 98.5°F | Ht 72.5 in | Wt 190.0 lb

## 2013-04-14 DIAGNOSIS — Z0000 Encounter for general adult medical examination without abnormal findings: Secondary | ICD-10-CM

## 2013-04-14 DIAGNOSIS — R972 Elevated prostate specific antigen [PSA]: Secondary | ICD-10-CM

## 2013-04-14 DIAGNOSIS — Z85820 Personal history of malignant melanoma of skin: Secondary | ICD-10-CM

## 2013-04-14 MED ORDER — LORAZEPAM 1 MG PO TABS: 1.0000 mg | ORAL_TABLET | Freq: Four times a day (QID) | ORAL | Status: DC | PRN

## 2013-04-14 NOTE — Telephone Encounter (Signed)
Pt is calling and requesting that orders be placed for his "yearly" chest xray. Please assist.

## 2013-04-14 NOTE — Telephone Encounter (Signed)
Pt meds was changed from Lisinopril to Lisinopril with HCTZ.

## 2013-04-14 NOTE — Progress Notes (Signed)
  Subjective:    Patient ID: [name], male    DOB: 04-24-1951, 62 y.o.   MRN: [id number]  HPI 62 yr old male for a cpx. He feels well today. He was recently diagnosed with a floater in the left eye by Dr. Oren Bracket at the [facility]. They chose not to treat it at this time. He was here 10 days ago and his BP was elevated, so Dr. Fabian Sharp put him back on his Lisinopril HCT. Of note his recent PSA jumped up to 5.03. We have been following him for BPH for some time.    Review of Systems  Constitutional: Negative.   HENT: Negative.   Eyes: Negative.   Respiratory: Negative.   Cardiovascular: Negative.   Gastrointestinal: Negative.   Genitourinary: Negative.   Musculoskeletal: Negative.   Skin: Negative.   Neurological: Negative.   Psychiatric/Behavioral: Negative.        Objective:   Physical Exam  Constitutional: He is oriented to person, place, and time. He appears well-developed and well-nourished. No distress.  HENT:  Head: Normocephalic and atraumatic.  Right Ear: External ear normal.  Left Ear: External ear normal.  Nose: Nose normal.  Mouth/Throat: Oropharynx is clear and moist. No oropharyngeal exudate.  Eyes: Conjunctivae and EOM are normal. Pupils are equal, round, and reactive to light. Right eye exhibits no discharge. Left eye exhibits no discharge. No scleral icterus.  Neck: Neck supple. No JVD present. No tracheal deviation present. No thyromegaly present.  Cardiovascular: Normal rate, regular rhythm, normal heart sounds and intact distal pulses.  Exam reveals no gallop and no friction rub.   No murmur heard. EKG normal   Pulmonary/Chest: Effort normal and breath sounds normal. No respiratory distress. He has no wheezes. He has no rales. He exhibits no tenderness.  Abdominal: Soft. Bowel sounds are normal. He exhibits no distension and no mass. There is no tenderness. There is no rebound and no guarding.  Genitourinary: Rectum normal and penis normal.  Guaiac negative stool. No penile tenderness.  Prostate is diffusely enlarged with the right lobe slightly larger than the left. It is firm but not hard. Not tender. No nodules   Musculoskeletal: Normal range of motion. He exhibits no edema and no tenderness.  Lymphadenopathy:    He has no cervical adenopathy.  Neurological: He is alert and oriented to person, place, and time. He has normal reflexes. No cranial nerve deficit. He exhibits normal muscle tone. Coordination normal.  Skin: Skin is warm and dry. No rash noted. He is not diaphoretic. No erythema. No pallor.  Psychiatric: He has a normal mood and affect. His behavior is normal. Judgment and thought content normal.          Assessment & Plan:  Well exam. We will refer to Urology for the elevated PSA. Recheck his HTN in one month.

## 2013-04-15 ENCOUNTER — Telehealth: Payer: Self-pay | Admitting: Family Medicine

## 2013-04-15 NOTE — Telephone Encounter (Signed)
The order was placed.

## 2013-04-15 NOTE — Telephone Encounter (Signed)
Pharm called. No request at this time

## 2013-04-15 NOTE — Telephone Encounter (Signed)
I spoke with pt  

## 2013-04-18 ENCOUNTER — Ambulatory Visit (INDEPENDENT_AMBULATORY_CARE_PROVIDER_SITE_OTHER)
Admission: RE | Admit: 2013-04-18 | Discharge: 2013-04-18 | Disposition: A | Discharge status: Home / Self Care | Payer: BC Managed Care – PPO | Source: Ambulatory Visit | Attending: Family Medicine | Admitting: Family Medicine

## 2013-04-18 DIAGNOSIS — Z85820 Personal history of malignant melanoma of skin: Secondary | ICD-10-CM

## 2013-04-18 NOTE — Progress Notes (Signed)
Quick Note:  I spoke with pt ______ 

## 2013-06-27 ENCOUNTER — Other Ambulatory Visit: Payer: Self-pay | Admitting: Family Medicine

## 2013-07-28 ENCOUNTER — Other Ambulatory Visit: Payer: Self-pay | Admitting: Family Medicine

## 2013-08-05 ENCOUNTER — Telehealth: Payer: Self-pay | Admitting: Family Medicine

## 2013-08-05 NOTE — Telephone Encounter (Signed)
I would recommend a Zostavax and a pneumonia shot soon

## 2013-08-05 NOTE — Telephone Encounter (Signed)
Pt would like to know if he is up to date on all his vaccines. 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 Pt is working today and ok to leave message on his personal phone.

## 2013-08-08 NOTE — Telephone Encounter (Signed)
I left voice message with below information. 

## 2013-08-11 ENCOUNTER — Ambulatory Visit (INDEPENDENT_AMBULATORY_CARE_PROVIDER_SITE_OTHER): Payer: BC Managed Care – PPO | Admitting: Family Medicine

## 2013-08-11 DIAGNOSIS — Z2911 Encounter for prophylactic immunotherapy for respiratory syncytial virus (RSV): Secondary | ICD-10-CM

## 2013-08-11 DIAGNOSIS — Z23 Encounter for immunization: Secondary | ICD-10-CM

## 2013-09-10 ENCOUNTER — Other Ambulatory Visit: Payer: Self-pay | Admitting: Family Medicine

## 2013-09-12 ENCOUNTER — Ambulatory Visit (INDEPENDENT_AMBULATORY_CARE_PROVIDER_SITE_OTHER): Payer: BC Managed Care – PPO | Admitting: Family Medicine

## 2013-09-12 DIAGNOSIS — Z23 Encounter for immunization: Secondary | ICD-10-CM

## 2013-09-16 ENCOUNTER — Other Ambulatory Visit: Payer: Self-pay | Admitting: Internal Medicine

## 2013-10-27 ENCOUNTER — Telehealth: Payer: Self-pay | Admitting: Family Medicine

## 2013-10-27 NOTE — Telephone Encounter (Signed)
[facility] [address] requesting refill of LORazepam (ATIVAN) 1 MG tablet

## 2013-10-27 NOTE — Telephone Encounter (Signed)
Call in #120 with 5 rf 

## 2013-10-28 MED ORDER — LORAZEPAM 1 MG PO TABS: 1.0000 mg | ORAL_TABLET | Freq: Four times a day (QID) | ORAL | Status: DC | PRN

## 2013-10-28 NOTE — Telephone Encounter (Signed)
I called in script 

## 2013-11-09 ENCOUNTER — Ambulatory Visit (INDEPENDENT_AMBULATORY_CARE_PROVIDER_SITE_OTHER): Payer: BC Managed Care – PPO | Admitting: Family Medicine

## 2013-11-09 ENCOUNTER — Encounter: Payer: Self-pay | Admitting: Family Medicine

## 2013-11-09 VITALS — BP 120/66 | HR 107 | Temp 98.8°F | Ht 72.5 in | Wt 197.0 lb

## 2013-11-09 DIAGNOSIS — J019 Acute sinusitis, unspecified: Secondary | ICD-10-CM

## 2013-11-09 MED ORDER — HYDROCODONE-HOMATROPINE 5-1.5 MG/5ML PO SYRP: 5.0000 mL | ORAL_SOLUTION | ORAL | Status: DC | PRN

## 2013-11-09 MED ORDER — AZITHROMYCIN 250 MG PO TABS: ORAL_TABLET | ORAL | Status: DC

## 2013-11-09 NOTE — Progress Notes (Signed)
Pre visit review using our clinic review tool, if applicable. No additional management support is needed unless otherwise documented below in the visit note. 

## 2013-11-09 NOTE — Progress Notes (Signed)
   Subjective:    Patient ID: [name], male    DOB: 1951/07/08, 63 y.o.   MRN: [id number]  HPI Here for 5 days of sinus pressure, PND, and a dry cough.    Review of Systems  Constitutional: Negative.   HENT: Positive for congestion, postnasal drip and sinus pressure.   Eyes: Negative.   Respiratory: Positive for cough and chest tightness.        Objective:   Physical Exam  Constitutional: He appears well-developed and well-nourished.  HENT:  Right Ear: External ear normal.  Left Ear: External ear normal.  Nose: Nose normal.  Mouth/Throat: Oropharynx is clear and moist.  Eyes: Conjunctivae are normal.  Pulmonary/Chest: Effort normal and breath sounds normal.  Lymphadenopathy:    He has no cervical adenopathy.          Assessment & Plan:  Add Mucinex

## 2013-12-13 ENCOUNTER — Other Ambulatory Visit: Payer: Self-pay | Admitting: Family Medicine

## 2014-01-09 ENCOUNTER — Other Ambulatory Visit: Payer: Self-pay | Admitting: Family Medicine

## 2014-02-01 ENCOUNTER — Ambulatory Visit (INDEPENDENT_AMBULATORY_CARE_PROVIDER_SITE_OTHER): Payer: BC Managed Care – PPO | Admitting: Family Medicine

## 2014-02-01 ENCOUNTER — Encounter: Payer: Self-pay | Admitting: Family Medicine

## 2014-02-01 VITALS — BP 145/81 | HR 80 | Temp 98.3°F | Ht 72.5 in | Wt 195.0 lb

## 2014-02-01 DIAGNOSIS — J019 Acute sinusitis, unspecified: Secondary | ICD-10-CM

## 2014-02-01 DIAGNOSIS — M722 Plantar fascial fibromatosis: Secondary | ICD-10-CM | POA: Insufficient documentation

## 2014-02-01 MED ORDER — AZITHROMYCIN 250 MG PO TABS: ORAL_TABLET | ORAL | Status: DC

## 2014-02-01 MED ORDER — DICLOFENAC SODIUM 75 MG PO TBEC: 75.0000 mg | DELAYED_RELEASE_TABLET | Freq: Two times a day (BID) | ORAL | Status: DC

## 2014-02-01 MED ORDER — FINASTERIDE 5 MG PO TABS: 5.0000 mg | ORAL_TABLET | Freq: Every day | ORAL | Status: DC

## 2014-02-01 MED ORDER — HYDROCODONE-HOMATROPINE 5-1.5 MG/5ML PO SYRP: 5.0000 mL | ORAL_SOLUTION | ORAL | Status: DC | PRN

## 2014-02-01 NOTE — Progress Notes (Signed)
Pre visit review using our clinic review tool, if applicable. No additional management support is needed unless otherwise documented below in the visit note. 

## 2014-02-01 NOTE — Progress Notes (Signed)
   Subjective:    Patient ID: [name], male    DOB: Oct 30, 1950, 63 y.o.   MRN: [id number]  HPI Here for 4 days of PND, ST, and a dry cough. He also asks about several months of pain around the right heel.    Review of Systems  Constitutional: Negative.   HENT: Positive for congestion, postnasal drip and sinus pressure.   Eyes: Negative.   Respiratory: Positive for cough.        Objective:   Physical Exam  Constitutional: He appears well-developed and well-nourished.  HENT:  Right Ear: External ear normal.  Left Ear: External ear normal.  Nose: Nose normal.  OP red without exudate   Eyes: Conjunctivae are normal.  Pulmonary/Chest: Effort normal and breath sounds normal.  Musculoskeletal:  Tender over the right foot arch and the heel   Lymphadenopathy:    He has no cervical adenopathy.          Assessment & Plan:  Written out of work today until 02-06-14 for the sinusitis. For the fasciitis wear supportive shoes, do Achilles stretches, and add Diclofenac.

## 2014-02-06 ENCOUNTER — Telehealth: Payer: Self-pay | Admitting: Family Medicine

## 2014-02-06 NOTE — Telephone Encounter (Signed)
Patient Information:  Caller Name: [place]  Phone: [phone]  Patient: [name], [name]  Gender: Male  DOB: 05/24/1951  Age: 63 Years  PCP: [name] Penna [facility])  Office Follow Up:  Does the office need to follow up with this patient?: Yes  Instructions For The Office: Symptoms not improved after antibiotic completed 02/05/14, patient calling back.  RN Note:  Home care advice given.  Advised patient update on condition after being seen and following tx will be sent for Md review and advice.  Symptoms  Reason For Call & Symptoms: Completed Zpack 02/05/14;  Still has sore throat and a hoarse voice and now with a cough.  Reviewed Health History In EMR: Yes  Reviewed Medications In EMR: Yes  Reviewed Allergies In EMR: Yes  Reviewed Surgeries / Procedures: Yes  Date of Onset of Symptoms: 01/25/2014  Treatments Tried: Zpack, cough drops, rx cough med's  Treatments Tried Worked: No  Guideline(s) Used:  Cough  Disposition Per Guideline:   See Within 3 Days in Office  Reason For Disposition Reached:   Taking an ACE Inhibitor medication (e.g., benazepril/LOTENSIN, captopril/CAPOTEN, enalapril/VASOTEC, lisinopril/ZESTRIL)  Advice Given:  Reassurance  Coughing is the way that our lungs remove irritants and mucus. It helps protect our lungs from getting pneumonia.  You can get a dry hacking cough after a chest cold. Sometimes this type of cough can last 1-3 weeks, and be worse at night.  Here is some care advice that should help.  Cough Medicines:  OTC Cough Syrups: The most common cough suppressant in OTC cough medications is dextromethorphan. Often the letters "DM" appear in the name.  OTC Cough Drops: Cough drops can help a lot, especially for mild coughs. They reduce coughing by soothing your irritated throat and removing that tickle sensation in the back of the throat. Cough drops also have the advantage of portability - you can carry them with you.  Home Remedy - Hard  Candy: Hard candy works just as well as medicine-flavored OTC cough drops. Diabetics should use sugar-free candy.  Home Remedy - Honey: This old home remedy has been shown to help decrease coughing at night. The adult dosage is 2 teaspoons (10 ml) at bedtime. Honey should not be given to infants under one year of age.  Coughing Spasms:  Drink warm fluids. Inhale warm mist (Reason: both relax the airway and loosen up the phlegm).  Suck on cough drops or hard candy to coat the irritated throat.  Prevent Dehydration:  Drink adequate liquids.  This will help soothe an irritated or dry throat and loosen up the phlegm.  Fever Medicines:  Acetaminophen (e.g., Tylenol):  Regular Strength Tylenol: Take 650 mg (two 325 mg pills) by mouth every 4-6 hours as needed. Each Regular Strength Tylenol pill has 325 mg of acetaminophen.  Expected Course:   The expected course depends on what is causing the cough.  Viral bronchitis (chest cold) causes a cough that lasts 1 to 3 weeks. Sometimes you may cough up lots of phlegm (sputum, mucus). The mucus can normally be white, gray, yellow, or green.  Call Back If:  Difficulty breathing  Cough lasts more than 3 weeks  Fever lasts > 3 days  You become worse.  RN Overrode Recommendation:  Document Patient  Patient seen in office and treated, symptoms not improved, patient calling back.

## 2014-02-07 MED ORDER — AMOXICILLIN-POT CLAVULANATE 875-125 MG PO TABS: 1.0000 | ORAL_TABLET | Freq: Two times a day (BID) | ORAL | Status: DC

## 2014-02-07 NOTE — Telephone Encounter (Signed)
Call in Augmentin [phone] to take bid for 10 days  

## 2014-02-07 NOTE — Telephone Encounter (Signed)
I sent script e-scribe and spoke with pt. 

## 2014-02-09 ENCOUNTER — Ambulatory Visit: Payer: BC Managed Care – PPO

## 2014-02-09 ENCOUNTER — Telehealth: Payer: Self-pay | Admitting: Family Medicine

## 2014-02-09 ENCOUNTER — Ambulatory Visit (INDEPENDENT_AMBULATORY_CARE_PROVIDER_SITE_OTHER): Payer: BC Managed Care – PPO | Admitting: Family Medicine

## 2014-02-09 VITALS — BP 134/82 | HR 75 | Temp 98.1°F | Resp 18 | Ht 73.0 in | Wt 197.0 lb

## 2014-02-09 DIAGNOSIS — M722 Plantar fascial fibromatosis: Secondary | ICD-10-CM

## 2014-02-09 DIAGNOSIS — M25579 Pain in unspecified ankle and joints of unspecified foot: Secondary | ICD-10-CM

## 2014-02-09 NOTE — Progress Notes (Signed)
   Subjective:    Patient ID: [name], male    DOB: [birth year], 63 y.o.   MRN: [id number]  HPI 63 year old male presents for evaluation of right foot pain. Was diagnosed with plantar fasciitis on 5/13 by his PCP. Started on voltaren 75 mg bid which he has been taking.  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 He has been on his feet quite a bit and has not been able to rest due to substitute job.  Has been trying to wear supportive shoes as directed.    Pain was improving until today when he took his dog out this morning. Stepped up onto a curb and felt a pop in the arch of his foot. This was accompanied by a significant increase in pain that has not improved through the day. Called his PCP who instructed him to come to an UC and have an x-ray.  He reports while waiting here today he has noted some swelling in his foot and continues to have pain to touch along his arch and the ball of his foot.     Review of Systems  Musculoskeletal: Positive for arthralgias and gait problem (limping).  Skin: Negative for color change.  Neurological: Negative for weakness and numbness.       Objective:   Physical Exam  Constitutional: He is oriented to person, place, and time. He appears well-developed and well-nourished.  HENT:  Head: Normocephalic and atraumatic.  Right Ear: External ear normal.  Left Ear: External ear normal.  Eyes: Conjunctivae are normal.  Neck: Normal range of motion.  Cardiovascular: Normal rate.   Pulmonary/Chest: Effort normal.  Musculoskeletal:       Right ankle: Normal. Achilles tendon normal.  +TTP along arch of foot and into the MTP joints. Minimal STS. No ecchymosis. Full ROM. Pain with dorsiflexion.  Capillary refill normal <2 seconds  Neurological: He is alert and oriented to person, place, and time.  Psychiatric: He has a normal mood and affect. His behavior is normal. Judgment  and thought content normal.      [facility] reading (PRIMARY) by  Dr. Tamala Julian as no acute bony abnormality. please comment on calcification on lateral view noted on plantar surface - sesamoid bone vs calcification.      Assessment & Plan:  Plantar fasciitis  Pain in joint, ankle and foot - Plan: DG Foot Complete Right  Placed in short CAM walker for comfort.  Continue voltaren 75 mg bid as instructed Handout with exercises and stretches provided to patient Reassurance provided. Out of work x 1 week. If no significant improvement at that time recommend f/u with PCP or possible ortho eval.

## 2014-02-09 NOTE — Patient Instructions (Signed)

## 2014-02-09 NOTE — Telephone Encounter (Signed)
Patient contacted Triage line regarding "felt foot pop, unable to bear weight and pain".  Attempted to contact patient back at [phone]. No contact. No Triage.  Reached voicemail. Left message to please contact the office for assistance.

## 2014-02-09 NOTE — Telephone Encounter (Signed)
Right foot pain, see previous note.

## 2014-02-09 NOTE — Telephone Encounter (Signed)
I suggest he go to Urgent Care ASAP to get some Xrays

## 2014-02-09 NOTE — Telephone Encounter (Signed)
I spoke with pt and he will follow this advise.

## 2014-02-09 NOTE — Telephone Encounter (Signed)
See previous note; Dr. Sarajane Jews advises UC for x rays

## 2014-02-27 ENCOUNTER — Other Ambulatory Visit: Payer: Self-pay | Admitting: Family Medicine

## 2014-04-10 ENCOUNTER — Other Ambulatory Visit (INDEPENDENT_AMBULATORY_CARE_PROVIDER_SITE_OTHER): Payer: BC Managed Care – PPO

## 2014-04-10 DIAGNOSIS — Z0000 Encounter for general adult medical examination without abnormal findings: Secondary | ICD-10-CM

## 2014-04-10 LAB — HEPATIC FUNCTION PANEL
ALT: 31 U/L (ref 0–53)
AST: 26 U/L (ref 0–37)
Albumin: 4 g/dL (ref 3.5–5.2)
Alkaline Phosphatase: 35 U/L — ABNORMAL LOW (ref 39–117)
BILIRUBIN DIRECT: 0.1 mg/dL (ref 0.0–0.3)
BILIRUBIN TOTAL: 0.4 mg/dL (ref 0.2–1.2)
TOTAL PROTEIN: 6.9 g/dL (ref 6.0–8.3)

## 2014-04-10 LAB — CBC WITH DIFFERENTIAL/PLATELET
BASOS PCT: 0.6 % (ref 0.0–3.0)
Basophils Absolute: 0 10*3/uL (ref 0.0–0.1)
EOS ABS: 0.1 10*3/uL (ref 0.0–0.7)
Eosinophils Relative: 1.1 % (ref 0.0–5.0)
HCT: 40.8 % (ref 39.0–52.0)
Hemoglobin: 13.9 g/dL (ref 13.0–17.0)
LYMPHS ABS: 1.5 10*3/uL (ref 0.7–4.0)
Lymphocytes Relative: 26 % (ref 12.0–46.0)
MCHC: 34 g/dL (ref 30.0–36.0)
MCV: 90.3 fl (ref 78.0–100.0)
MONO ABS: 0.5 10*3/uL (ref 0.1–1.0)
Monocytes Relative: 9 % (ref 3.0–12.0)
Neutro Abs: 3.7 10*3/uL (ref 1.4–7.7)
Neutrophils Relative %: 63.3 % (ref 43.0–77.0)
PLATELETS: 211 10*3/uL (ref 150.0–400.0)
RBC: 4.52 Mil/uL (ref 4.22–5.81)
RDW: 13 % (ref 11.5–15.5)
WBC: 5.8 10*3/uL (ref 4.0–10.5)

## 2014-04-10 LAB — BASIC METABOLIC PANEL
BUN: 21 mg/dL (ref 6–23)
CHLORIDE: 98 meq/L (ref 96–112)
CO2: 29 meq/L (ref 19–32)
Calcium: 9.1 mg/dL (ref 8.4–10.5)
Creatinine, Ser: 1.3 mg/dL (ref 0.4–1.5)
GFR: 58.63 mL/min — ABNORMAL LOW (ref >60.00)
Glucose, Bld: 96 mg/dL (ref 70–99)
Potassium: 4.8 mEq/L (ref 3.5–5.1)
Sodium: 133 mEq/L — ABNORMAL LOW (ref 135–145)

## 2014-04-10 LAB — POCT URINALYSIS DIPSTICK
BILIRUBIN UA: NEGATIVE
Blood, UA: NEGATIVE
Glucose, UA: NEGATIVE
Ketones, UA: NEGATIVE
NITRITE UA: NEGATIVE
PH UA: 7
PROTEIN UA: NEGATIVE
Spec Grav, UA: 1.015
Urobilinogen, UA: 0.2

## 2014-04-10 LAB — LIPID PANEL
Cholesterol: 216 mg/dL — ABNORMAL HIGH (ref 0–200)
HDL: 49.9 mg/dL (ref >39.00)
LDL Cholesterol: 143 mg/dL — ABNORMAL HIGH (ref 0–99)
NonHDL: 166.1
Total CHOL/HDL Ratio: 4
Triglycerides: 114 mg/dL (ref 0.0–149.0)
VLDL: 22.8 mg/dL (ref 0.0–40.0)

## 2014-04-10 LAB — TSH: TSH: 1.73 u[IU]/mL (ref 0.35–4.50)

## 2014-04-10 LAB — PSA: PSA: 3.12 ng/mL (ref 0.10–4.00)

## 2014-04-12 ENCOUNTER — Other Ambulatory Visit: Payer: Self-pay | Admitting: Internal Medicine

## 2014-04-17 ENCOUNTER — Ambulatory Visit (INDEPENDENT_AMBULATORY_CARE_PROVIDER_SITE_OTHER)
Admission: RE | Admit: 2014-04-17 | Discharge: 2014-04-17 | Disposition: A | Discharge status: Home / Self Care | Payer: BC Managed Care – PPO | Source: Ambulatory Visit | Attending: Family Medicine | Admitting: Family Medicine

## 2014-04-17 ENCOUNTER — Encounter: Payer: Self-pay | Admitting: Family Medicine

## 2014-04-17 ENCOUNTER — Ambulatory Visit (INDEPENDENT_AMBULATORY_CARE_PROVIDER_SITE_OTHER): Payer: BC Managed Care – PPO | Admitting: Family Medicine

## 2014-04-17 VITALS — BP 140/72 | HR 82 | Temp 99.3°F | Ht 72.5 in | Wt 198.0 lb

## 2014-04-17 DIAGNOSIS — Z85820 Personal history of malignant melanoma of skin: Secondary | ICD-10-CM

## 2014-04-17 DIAGNOSIS — M25571 Pain in right ankle and joints of right foot: Secondary | ICD-10-CM

## 2014-04-17 DIAGNOSIS — Z0000 Encounter for general adult medical examination without abnormal findings: Secondary | ICD-10-CM

## 2014-04-17 DIAGNOSIS — E785 Hyperlipidemia, unspecified: Secondary | ICD-10-CM

## 2014-04-17 DIAGNOSIS — M25579 Pain in unspecified ankle and joints of unspecified foot: Secondary | ICD-10-CM

## 2014-04-17 MED ORDER — LORAZEPAM 1 MG PO TABS: 1.0000 mg | ORAL_TABLET | Freq: Four times a day (QID) | ORAL | Status: DC | PRN

## 2014-04-17 MED ORDER — KETOCONAZOLE 2 % EX CREA: TOPICAL_CREAM | CUTANEOUS | Status: DC

## 2014-04-17 MED ORDER — CYCLOBENZAPRINE HCL 10 MG PO TABS: ORAL_TABLET | ORAL | Status: DC

## 2014-04-17 NOTE — Progress Notes (Signed)
Pre visit review using our clinic review tool, if applicable. No additional management support is needed unless otherwise documented below in the visit note. 

## 2014-04-17 NOTE — Progress Notes (Signed)
   Subjective:    Patient ID: [name], male    DOB: [birth year], 63 y.o.   MRN: [id number]  HPI 63 yr old male for a cpx. He has a few issues to discuss. He continues to have pain in the bottom of the right foot after 5 months despite using NSAIDs, wearing a Cam boot, etc. He has put on a little weight because he cannot exercise. He has been seeing Dr. Janice Norrie for his prostatitis and elevated PSA, and he is due to see him again in December.    Review of Systems  Constitutional: Negative.   HENT: Negative.   Eyes: Negative.   Respiratory: Negative.   Cardiovascular: Negative.   Gastrointestinal: Negative.   Genitourinary: Negative.   Musculoskeletal: Negative.   Skin: Negative.   Neurological: Negative.   Psychiatric/Behavioral: Negative.        Objective:   Physical Exam  Constitutional: He is oriented to person, place, and time. He appears well-developed and well-nourished. No distress.  HENT:  Head: Normocephalic and atraumatic.  Right Ear: External ear normal.  Left Ear: External ear normal.  Nose: Nose normal.  Mouth/Throat: Oropharynx is clear and moist. No oropharyngeal exudate.  Eyes: Conjunctivae and EOM are normal. Pupils are equal, round, and reactive to light. Right eye exhibits no discharge. Left eye exhibits no discharge. No scleral icterus.  Neck: Neck supple. No JVD present. No tracheal deviation present. No thyromegaly present.  Cardiovascular: Normal rate, regular rhythm, normal heart sounds and intact distal pulses.  Exam reveals no gallop and no friction rub.   No murmur heard. EKG normal   Pulmonary/Chest: Effort normal and breath sounds normal. No respiratory distress. He has no wheezes. He has no rales. He exhibits no tenderness.  Abdominal: Soft. Bowel sounds are normal. He exhibits no distension and no mass. There is no tenderness. There is no rebound and no guarding.  Genitourinary: Rectum normal, prostate normal and penis normal. Guaiac negative  stool. No penile tenderness.  Musculoskeletal: Normal range of motion. He exhibits no edema and no tenderness.  Lymphadenopathy:    He has no cervical adenopathy.  Neurological: He is alert and oriented to person, place, and time. He has normal reflexes. No cranial nerve deficit. He exhibits normal muscle tone. Coordination normal.  Skin: Skin is warm and dry. No rash noted. He is not diaphoretic. No erythema. No pallor.  Psychiatric: He has a normal mood and affect. His behavior is normal. Judgment and thought content normal.          Assessment & Plan:  Well exam. Refer to see Dr. Paulla Dolly again for the foot pain. Get a CXR today

## 2014-05-04 ENCOUNTER — Encounter: Payer: Self-pay | Admitting: Podiatry

## 2014-05-04 ENCOUNTER — Ambulatory Visit (INDEPENDENT_AMBULATORY_CARE_PROVIDER_SITE_OTHER): Payer: BC Managed Care – PPO

## 2014-05-04 ENCOUNTER — Ambulatory Visit (INDEPENDENT_AMBULATORY_CARE_PROVIDER_SITE_OTHER): Payer: BC Managed Care – PPO | Admitting: Podiatry

## 2014-05-04 VITALS — BP 136/77 | HR 86 | Resp 16 | Ht 73.0 in | Wt 197.0 lb

## 2014-05-04 DIAGNOSIS — M2420 Disorder of ligament, unspecified site: Secondary | ICD-10-CM

## 2014-05-04 DIAGNOSIS — M629 Disorder of muscle, unspecified: Secondary | ICD-10-CM

## 2014-05-04 DIAGNOSIS — M722 Plantar fascial fibromatosis: Secondary | ICD-10-CM

## 2014-05-04 DIAGNOSIS — S93699A Other sprain of unspecified foot, initial encounter: Secondary | ICD-10-CM

## 2014-05-04 NOTE — Progress Notes (Signed)
   Subjective:    Patient ID: [name], male    DOB: [birth year], 63 y.o.   MRN: [id number]  HPI Comments: "I have been treated for plantar fasciitis"  Patient c/o aching, stiffness plantar arch and heel, some lateral right foot since April 2015. Does not have AM pain. Worsens as the day goes on. He saw his PCP and Rx'd Diclofenac. He has stopped walking. Did have a pop sensation occur and went to UC and they gave boot to wear for awhile. Not getting better.  Foot Pain      Review of Systems  Constitutional: Positive for unexpected weight change.  HENT: Positive for tinnitus.   Endocrine: Positive for polyuria.  Musculoskeletal: Positive for gait problem.  Psychiatric/Behavioral: The patient is nervous/anxious.   All other systems reviewed and are negative.      Objective:   Physical Exam        Assessment & Plan:

## 2014-05-04 NOTE — Patient Instructions (Signed)

## 2014-05-05 NOTE — Progress Notes (Signed)
Subjective:     Patient ID: [name], male   DOB: 1951/08/10, 63 y.o.   MRN: [id number]  Foot Pain   patient presents stating he felt a pop in his foot several months ago and he wore a boot but continues to have discomfort in the arch and heel of the right foot. States that it makes it difficult at times to walk   Review of Systems  All other systems reviewed and are negative.      Objective:   Physical Exam  Nursing note and vitals reviewed. Constitutional: He is oriented to person, place, and time.  Cardiovascular: Intact distal pulses.   Musculoskeletal: Normal range of motion.  Neurological: He is oriented to person, place, and time.  Skin: Skin is warm.   neurovascular status intact with muscle strength adequate and range of motion subtalar midtarsal joint within normal limits. Patient is found to have a deficit in the plantar fascia right with discomfort in the mid arch area and trouble with walking with some diminishment of the arch height noted at this time. Digits are found to be well perfused and I did note a relatively high arch foot but reduced on the right side secondary to injury     Assessment:     Probable plantar fascially tear right with also chronic plantar fasciitis    Plan:     H&P and x-ray reviewed. I went ahead today and dispensed fascially brace with instructions on usage continued boot usage and scanned for custom orthotic devices. Explained that this should heal but may take another 8 weeks for complete healing to occur

## 2014-05-23 ENCOUNTER — Ambulatory Visit (INDEPENDENT_AMBULATORY_CARE_PROVIDER_SITE_OTHER): Payer: BC Managed Care – PPO | Admitting: *Deleted

## 2014-05-23 DIAGNOSIS — S93699A Other sprain of unspecified foot, initial encounter: Secondary | ICD-10-CM

## 2014-05-23 DIAGNOSIS — M629 Disorder of muscle, unspecified: Secondary | ICD-10-CM

## 2014-05-23 NOTE — Progress Notes (Signed)
Dispensed patient's orthotics with oral and written instructions for wearing. Patient will follow up with Dr. Paulla Dolly as needed.

## 2014-05-23 NOTE — Patient Instructions (Signed)

## 2014-05-26 ENCOUNTER — Ambulatory Visit (INDEPENDENT_AMBULATORY_CARE_PROVIDER_SITE_OTHER): Payer: BC Managed Care – PPO | Admitting: Family Medicine

## 2014-05-26 ENCOUNTER — Encounter: Payer: Self-pay | Admitting: Family Medicine

## 2014-05-26 ENCOUNTER — Other Ambulatory Visit: Payer: BC Managed Care – PPO

## 2014-05-26 VITALS — BP 145/80 | HR 81 | Temp 97.9°F | Ht 72.5 in | Wt 198.0 lb

## 2014-05-26 DIAGNOSIS — R110 Nausea: Secondary | ICD-10-CM

## 2014-05-26 DIAGNOSIS — M722 Plantar fascial fibromatosis: Secondary | ICD-10-CM

## 2014-05-26 MED ORDER — PROMETHAZINE HCL 12.5 MG PO TABS: 12.5000 mg | ORAL_TABLET | ORAL | Status: DC | PRN

## 2014-05-26 NOTE — Progress Notes (Signed)
   Subjective:    Patient ID: Hillman Attig, male    DOB: 08/18/1951, 63 y.o.   MRN: [id number]  HPI Here for one week of mild fatigue and nausea. No vomiting of fever. No URI sx. No change in bowel or bladder function. He has been taking Diclofenac for several months to treat plantar fasciitis. He saw Dr. Paulla Dolly and had impressions made for rigid inserts.    Review of Systems  Constitutional: Positive for fatigue. Negative for fever, chills and diaphoresis.  Respiratory: Negative.   Cardiovascular: Negative.   Gastrointestinal: Positive for nausea. Negative for vomiting, abdominal pain, diarrhea, constipation, blood in stool, abdominal distention, anal bleeding and rectal pain.  Endocrine: Negative.   Genitourinary: Negative.        Objective:   Physical Exam  Constitutional: He appears well-developed and well-nourished.  Eyes: Conjunctivae are normal.  Neck: No thyromegaly present.  Cardiovascular: Normal rate, regular rhythm, normal heart sounds and intact distal pulses.   Pulmonary/Chest: Effort normal and breath sounds normal.  Abdominal: Soft. Bowel sounds are normal. He exhibits no distension and no mass. There is no tenderness. There is no rebound and no guarding.  Lymphadenopathy:    He has no cervical adenopathy.          Assessment & Plan:  This is most likely some GI side effects of his NSAID use. He will stop the Diclofenac, use Phenergan prn.

## 2014-05-26 NOTE — Progress Notes (Signed)
Pre visit review using our clinic review tool, if applicable. No additional management support is needed unless otherwise documented below in the visit note. 

## 2014-07-17 ENCOUNTER — Other Ambulatory Visit: Payer: Self-pay | Admitting: Family Medicine

## 2014-07-27 NOTE — Progress Notes (Signed)
History and physical examinations reviewed with Georgiann Mccoy, PA-C.  Foot xray reviewed and negative.  Agree with assessment and plan.

## 2014-08-11 ENCOUNTER — Other Ambulatory Visit: Payer: Self-pay | Admitting: Family Medicine

## 2014-10-10 ENCOUNTER — Telehealth: Payer: Self-pay

## 2014-10-10 NOTE — Telephone Encounter (Signed)
Rx request for Lorazepam 1 mg tablet- Take 1 tablet by mouth every 6 hours as needed for anxiety #120  Pharm:  [facility] [address] [place]   Pls advise.

## 2014-10-11 ENCOUNTER — Telehealth: Payer: Self-pay | Admitting: *Deleted

## 2014-10-11 NOTE — Telephone Encounter (Signed)
Patient is requesting a refill of Lorazepam 1mg  .  Take 1 tab by mouth every 6 hours as needed for anxiety.  [facility] [address], [place], [place] [address] [phone] Okay to refill?

## 2014-10-11 NOTE — Telephone Encounter (Signed)
Call in #120 with 5 rf 

## 2014-10-12 MED ORDER — LORAZEPAM 1 MG PO TABS: 1.0000 mg | ORAL_TABLET | Freq: Four times a day (QID) | ORAL | Status: DC | PRN

## 2014-10-12 NOTE — Telephone Encounter (Signed)
I called in script 

## 2014-10-12 NOTE — Telephone Encounter (Signed)
Dr. Sarajane Jews did approve refill and I called in script.

## 2014-11-17 ENCOUNTER — Other Ambulatory Visit: Payer: Self-pay | Admitting: Family Medicine

## 2014-11-17 NOTE — Telephone Encounter (Signed)
Pt has right eye facial pressure/pain, along w/ yellow tanish discharge out of the right nasal passage . Pt has started mucinex for a sore throat and the throat is better.  Pt states he has poss sinus infection.  Would like to know if dr fry will call in something for that and pt not have to come in?  [facility]  [address]

## 2014-11-18 ENCOUNTER — Ambulatory Visit (INDEPENDENT_AMBULATORY_CARE_PROVIDER_SITE_OTHER): Payer: BC Managed Care – PPO | Admitting: Family Medicine

## 2014-11-18 ENCOUNTER — Encounter: Payer: Self-pay | Admitting: Family Medicine

## 2014-11-18 VITALS — BP 130/78 | HR 89 | Temp 97.9°F | Ht 72.5 in | Wt 199.4 lb

## 2014-11-18 DIAGNOSIS — J329 Chronic sinusitis, unspecified: Secondary | ICD-10-CM

## 2014-11-18 DIAGNOSIS — R51 Headache: Secondary | ICD-10-CM

## 2014-11-18 DIAGNOSIS — J0100 Acute maxillary sinusitis, unspecified: Secondary | ICD-10-CM

## 2014-11-18 DIAGNOSIS — R519 Headache, unspecified: Secondary | ICD-10-CM

## 2014-11-18 MED ORDER — AMOXICILLIN-POT CLAVULANATE 875-125 MG PO TABS: 1.0000 | ORAL_TABLET | Freq: Two times a day (BID) | ORAL | Status: DC

## 2014-11-18 MED ORDER — FLUTICASONE PROPIONATE 50 MCG/ACT NA SUSP: NASAL | Status: DC

## 2014-11-18 NOTE — Patient Instructions (Addendum)
Drink plenty of fluids and get enough rest  Use the fluticasone nose spray 2 sprays each nostril twice daily for 3 days, then once daily to open up his sinuses.  Take Augmentin 1 twice daily for infection (amoxicillin/clavulanate)  Tylenol or ibuprofen for pain  Consider taking Claritin-D or Allegra-D one daily  Return if worse

## 2014-11-18 NOTE — Progress Notes (Signed)
Subjective: 64 year old man who has been ill for about 4 days. He started with waking up with a lump in his throat. He had just a sore throat for several days. He still has had some postnasal drainage. However last couple of days the pain is centered over his right axillary area below his right eye. He has had some chills but no documented fevers. He does not smoke. He does blow some mucus. He has not been coughing much.  Objective: Pleasant alert gentleman in no acute distress but he however she doesn't feel well. He is sniffling. His TMs are normal. Tender to percussion over the right maxillary sinus, not on the left. Throat erythematous without exudate. Neck supple without significant nodes. No thyromegaly. Chest is clear to auscultation. Heart regular without murmurs.  Assessment: Upper respiratory infection Right acute maxillary sinusitis  Plan: Due to the focal nature of the pain will treat this as a bacterial sinusitis with Augmentin and fluticasone. Return if worse

## 2014-11-20 MED ORDER — AZITHROMYCIN 250 MG PO TABS
ORAL_TABLET | ORAL | Status: DC
Start: 2014-11-20 — End: 2014-11-20

## 2014-11-20 NOTE — Telephone Encounter (Signed)
Rx sent in.  Spoke with pt. He went to [facility] on Saturday and was Rx'ed meds by Dr. Linna Darner

## 2014-11-20 NOTE — Telephone Encounter (Signed)
Call in a Zpack  ?

## 2014-12-20 ENCOUNTER — Ambulatory Visit (INDEPENDENT_AMBULATORY_CARE_PROVIDER_SITE_OTHER): Payer: BC Managed Care – PPO | Admitting: Family Medicine

## 2014-12-20 ENCOUNTER — Encounter: Payer: Self-pay | Admitting: Family Medicine

## 2014-12-20 VITALS — BP 130/80 | HR 81 | Temp 98.0°F | Wt 198.0 lb

## 2014-12-20 DIAGNOSIS — R05 Cough: Secondary | ICD-10-CM

## 2014-12-20 DIAGNOSIS — R059 Cough, unspecified: Secondary | ICD-10-CM

## 2014-12-20 MED ORDER — HYDROCODONE-HOMATROPINE 5-1.5 MG/5ML PO SYRP: 5.0000 mL | ORAL_SOLUTION | Freq: Four times a day (QID) | ORAL | Status: AC | PRN

## 2014-12-20 NOTE — Progress Notes (Signed)
   Subjective:    Patient ID: [name], male    DOB: [birth year], 64 y.o.   MRN: [id number]  HPI Acute visit. Patient is nonsmoker seen with one-week history of cough. Mostly nonproductive. One month ago he was treated for acute sinusitis with Augmentin. These symptoms are different. Occasional mild headache. No focal facial pain. No fever. No dyspnea. No wheezing. Mild sore throat. Minimal nasal congestion.  Past Medical History  Diagnosis Date  . Anxiety   . GERD (gastroesophageal reflux disease)   . Hyperlipidemia   . Hypertension   . ED (erectile dysfunction)   . Kidney stone 07-29-11    passed   . Vitreous floater     left eye, sees Dr. Dawna Part at [facility]    Past Surgical History  Procedure Laterality Date  . Hernia repair    . Basal cell carcinoma excision      removed from scalp  . Colonoscopy  07-09-07    per Dr. Olevia Perches, clear, repeat in 10 yrs     reports that he has never smoked. He has never used smokeless tobacco. He reports that he does not drink alcohol or use illicit drugs. family history includes Alzheimer's disease in an other family member; Depression in an other family member; Diabetes in an other family member; Hyperlipidemia in his mother and another family member; Hypertension in his mother and another family member. Allergies  Allergen Reactions  . Codeine       Review of Systems  Constitutional: Negative for chills.  HENT: Positive for congestion and sore throat.   Respiratory: Positive for cough.        Objective:   Physical Exam  Constitutional: He appears well-developed and well-nourished. No distress.  HENT:  Right Ear: External ear normal.  Left Ear: External ear normal.  Mouth/Throat: Oropharynx is clear and moist.  Neck: Neck supple.  Cardiovascular: Normal rate and regular rhythm.   Pulmonary/Chest: Effort normal and breath sounds normal. No respiratory distress. He has no wheezes. He has no rales.  Lymphadenopathy:      He has no cervical adenopathy.          Assessment & Plan:  Cough. Suspect acute viral process. Hycodan cough syrup 1 teaspoon every 6 hours for severe cough. Follow-up promptly for fever or worsening symptoms.

## 2014-12-20 NOTE — Progress Notes (Signed)
Pre visit review using our clinic review tool, if applicable. No additional management support is needed unless otherwise documented below in the visit note. 

## 2014-12-20 NOTE — Patient Instructions (Signed)

## 2015-01-23 ENCOUNTER — Other Ambulatory Visit: Payer: Self-pay | Admitting: Family Medicine

## 2015-03-05 ENCOUNTER — Other Ambulatory Visit: Payer: Self-pay | Admitting: Family Medicine

## 2015-04-06 ENCOUNTER — Telehealth: Payer: Self-pay | Admitting: Family Medicine

## 2015-04-06 MED ORDER — LORAZEPAM 1 MG PO TABS: 1.0000 mg | ORAL_TABLET | Freq: Four times a day (QID) | ORAL | Status: DC | PRN

## 2015-04-06 NOTE — Telephone Encounter (Signed)
Call in #120 with 5 rf 

## 2015-04-06 NOTE — Telephone Encounter (Signed)
Needs his Lorazepam faxed to the pharmacy today please as he is out. His pharmacy was supposed to send a request and they did not send. [place]

## 2015-04-06 NOTE — Telephone Encounter (Signed)
I called in script 

## 2015-04-06 NOTE — Addendum Note (Signed)
Addended by: Aggie Hacker A on: 04/06/2015 04:54 PM   Modules accepted: Orders

## 2015-04-12 ENCOUNTER — Other Ambulatory Visit (INDEPENDENT_AMBULATORY_CARE_PROVIDER_SITE_OTHER): Payer: BC Managed Care – PPO

## 2015-04-12 DIAGNOSIS — Z0000 Encounter for general adult medical examination without abnormal findings: Secondary | ICD-10-CM

## 2015-04-12 LAB — HEPATIC FUNCTION PANEL
ALT: 19 U/L (ref 0–53)
AST: 20 U/L (ref 0–37)
Albumin: 4.2 g/dL (ref 3.5–5.2)
Alkaline Phosphatase: 44 U/L (ref 39–117)
BILIRUBIN DIRECT: 0.1 mg/dL (ref 0.0–0.3)
Total Bilirubin: 0.4 mg/dL (ref 0.2–1.2)
Total Protein: 6.8 g/dL (ref 6.0–8.3)

## 2015-04-12 LAB — POCT URINALYSIS DIPSTICK
Bilirubin, UA: NEGATIVE
Blood, UA: NEGATIVE
Glucose, UA: NEGATIVE
KETONES UA: NEGATIVE
Leukocytes, UA: NEGATIVE
NITRITE UA: NEGATIVE
PH UA: 6
PROTEIN UA: NEGATIVE
Spec Grav, UA: <=1.005
Urobilinogen, UA: 0.2

## 2015-04-12 LAB — BASIC METABOLIC PANEL
BUN: 22 mg/dL (ref 6–23)
CALCIUM: 9.3 mg/dL (ref 8.4–10.5)
CO2: 28 meq/L (ref 19–32)
Chloride: 100 mEq/L (ref 96–112)
Creatinine, Ser: 1.13 mg/dL (ref 0.40–1.50)
GFR: 69.31 mL/min (ref >60.00)
Glucose, Bld: 103 mg/dL — ABNORMAL HIGH (ref 70–99)
POTASSIUM: 4.6 meq/L (ref 3.5–5.1)
Sodium: 135 mEq/L (ref 135–145)

## 2015-04-12 LAB — CBC WITH DIFFERENTIAL/PLATELET
Basophils Absolute: 0 10*3/uL (ref 0.0–0.1)
Basophils Relative: 0.8 % (ref 0.0–3.0)
EOS PCT: 1.2 % (ref 0.0–5.0)
Eosinophils Absolute: 0.1 10*3/uL (ref 0.0–0.7)
HCT: 42.6 % (ref 39.0–52.0)
Hemoglobin: 14.6 g/dL (ref 13.0–17.0)
LYMPHS ABS: 1.2 10*3/uL (ref 0.7–4.0)
Lymphocytes Relative: 20.4 % (ref 12.0–46.0)
MCHC: 34.3 g/dL (ref 30.0–36.0)
MCV: 87.9 fl (ref 78.0–100.0)
MONOS PCT: 9.7 % (ref 3.0–12.0)
Monocytes Absolute: 0.6 10*3/uL (ref 0.1–1.0)
Neutro Abs: 4.1 10*3/uL (ref 1.4–7.7)
Neutrophils Relative %: 67.9 % (ref 43.0–77.0)
PLATELETS: 207 10*3/uL (ref 150.0–400.0)
RBC: 4.85 Mil/uL (ref 4.22–5.81)
RDW: 14.2 % (ref 11.5–15.5)
WBC: 6.1 10*3/uL (ref 4.0–10.5)

## 2015-04-12 LAB — LIPID PANEL
CHOLESTEROL: 184 mg/dL (ref 0–200)
HDL: 51.7 mg/dL (ref >39.00)
LDL Cholesterol: 113 mg/dL — ABNORMAL HIGH (ref 0–99)
NONHDL: 132.3
TRIGLYCERIDES: 96 mg/dL (ref 0.0–149.0)
Total CHOL/HDL Ratio: 4
VLDL: 19.2 mg/dL (ref 0.0–40.0)

## 2015-04-12 LAB — PSA: PSA: 1.23 ng/mL (ref 0.10–4.00)

## 2015-04-12 LAB — TSH: TSH: 1.61 u[IU]/mL (ref 0.35–4.50)

## 2015-04-19 ENCOUNTER — Encounter: Payer: Self-pay | Admitting: Family Medicine

## 2015-04-19 ENCOUNTER — Ambulatory Visit (INDEPENDENT_AMBULATORY_CARE_PROVIDER_SITE_OTHER): Payer: BC Managed Care – PPO | Admitting: Family Medicine

## 2015-04-19 ENCOUNTER — Ambulatory Visit (INDEPENDENT_AMBULATORY_CARE_PROVIDER_SITE_OTHER)
Admission: RE | Admit: 2015-04-19 | Discharge: 2015-04-19 | Disposition: A | Discharge status: Home / Self Care | Payer: BC Managed Care – PPO | Source: Ambulatory Visit | Attending: Family Medicine | Admitting: Family Medicine

## 2015-04-19 VITALS — BP 148/86 | HR 91 | Temp 98.1°F | Ht 72.5 in | Wt 192.0 lb

## 2015-04-19 DIAGNOSIS — Z85820 Personal history of malignant melanoma of skin: Secondary | ICD-10-CM

## 2015-04-19 DIAGNOSIS — Z0000 Encounter for general adult medical examination without abnormal findings: Secondary | ICD-10-CM

## 2015-04-19 NOTE — Progress Notes (Signed)
   Subjective:    Patient ID: [name], male    DOB: Mar 18, 1951, 64 y.o.   MRN: [id number]  HPI 64 yr old male for a cpx. He feels well in general. He has changed his diet a lot in the past year and he as been able to reduce his LDL by 30 points. He does mention having trouble sleeping at times.    Review of Systems  Constitutional: Negative.   HENT: Negative.   Eyes: Negative.   Respiratory: Negative.   Cardiovascular: Negative.   Gastrointestinal: Negative.   Genitourinary: Negative.   Musculoskeletal: Negative.   Skin: Negative.   Neurological: Negative.   Psychiatric/Behavioral: Negative.        Objective:   Physical Exam  Constitutional: He is oriented to person, place, and time. He appears well-developed and well-nourished. No distress.  HENT:  Head: Normocephalic and atraumatic.  Right Ear: External ear normal.  Left Ear: External ear normal.  Nose: Nose normal.  Mouth/Throat: Oropharynx is clear and moist. No oropharyngeal exudate.  Eyes: Conjunctivae and EOM are normal. Pupils are equal, round, and reactive to light. Right eye exhibits no discharge. Left eye exhibits no discharge. No scleral icterus.  Neck: Neck supple. No JVD present. No tracheal deviation present. No thyromegaly present.  Cardiovascular: Normal rate, regular rhythm, normal heart sounds and intact distal pulses.  Exam reveals no gallop and no friction rub.   No murmur heard. EKG normal  Pulmonary/Chest: Effort normal and breath sounds normal. No respiratory distress. He has no wheezes. He has no rales. He exhibits no tenderness.  Abdominal: Soft. Bowel sounds are normal. He exhibits no distension and no mass. There is no tenderness. There is no rebound and no guarding.  Genitourinary: Rectum normal, prostate normal and penis normal. Guaiac negative stool. No penile tenderness.  Musculoskeletal: Normal range of motion. He exhibits no edema or tenderness.  Lymphadenopathy:    He has no  cervical adenopathy.  Neurological: He is alert and oriented to person, place, and time. He has normal reflexes. No cranial nerve deficit. He exhibits normal muscle tone. Coordination normal.  Skin: Skin is warm and dry. No rash noted. He is not diaphoretic. No erythema. No pallor.  Psychiatric: He has a normal mood and affect. His behavior is normal. Judgment and thought content normal.          Assessment & Plan:  well exam. We discussed diet and exercise advice. Set up a CXR due to his hx of melanoma. Try melatonin 5 mg at bedtime for sleep.

## 2015-04-19 NOTE — Progress Notes (Signed)
Pre visit review using our clinic review tool, if applicable. No additional management support is needed unless otherwise documented below in the visit note. 

## 2015-04-29 ENCOUNTER — Other Ambulatory Visit: Payer: Self-pay | Admitting: Family Medicine

## 2015-04-29 ENCOUNTER — Encounter: Payer: Self-pay | Admitting: Family Medicine

## 2015-04-30 MED ORDER — LISINOPRIL-HYDROCHLOROTHIAZIDE 20-12.5 MG PO TABS: 1.0000 | ORAL_TABLET | Freq: Every day | ORAL | Status: DC

## 2015-04-30 MED ORDER — CYCLOBENZAPRINE HCL 10 MG PO TABS: ORAL_TABLET | ORAL | Status: DC

## 2015-04-30 NOTE — Addendum Note (Signed)
Addended by: Aggie Hacker A on: 04/30/2015 12:41 PM   Modules accepted: Orders

## 2015-06-05 ENCOUNTER — Encounter: Payer: Self-pay | Admitting: Family Medicine

## 2015-06-05 ENCOUNTER — Ambulatory Visit (INDEPENDENT_AMBULATORY_CARE_PROVIDER_SITE_OTHER): Payer: BC Managed Care – PPO | Admitting: Family Medicine

## 2015-06-05 VITALS — BP 138/83 | HR 75 | Temp 98.3°F | Ht 72.5 in | Wt 195.0 lb

## 2015-06-05 DIAGNOSIS — N138 Other obstructive and reflux uropathy: Secondary | ICD-10-CM

## 2015-06-05 DIAGNOSIS — N401 Enlarged prostate with lower urinary tract symptoms: Secondary | ICD-10-CM

## 2015-06-05 DIAGNOSIS — G8929 Other chronic pain: Secondary | ICD-10-CM | POA: Insufficient documentation

## 2015-06-05 DIAGNOSIS — M542 Cervicalgia: Secondary | ICD-10-CM | POA: Diagnosis not present

## 2015-06-05 DIAGNOSIS — R600 Localized edema: Secondary | ICD-10-CM | POA: Diagnosis not present

## 2015-06-05 DIAGNOSIS — I10 Essential (primary) hypertension: Secondary | ICD-10-CM | POA: Diagnosis not present

## 2015-06-05 MED ORDER — IBUPROFEN 800 MG PO TABS: 800.0000 mg | ORAL_TABLET | Freq: Four times a day (QID) | ORAL | Status: DC | PRN

## 2015-06-05 MED ORDER — LISINOPRIL 20 MG PO TABS: 20.0000 mg | ORAL_TABLET | Freq: Every day | ORAL | Status: DC

## 2015-06-05 NOTE — Progress Notes (Signed)
   Subjective:    Patient ID: [name], male    DOB: [birth year], 64 y.o.   MRN: [id number]  HPI Here for intermittent stiffness and pain in the neck. This started several years ago. It only bothers him when he works for prolonged periods of time in a hunched over position. 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 No pain down the arms. Heat helps. He usually takes a single Ibuprofen pill when this flares up and this helps for awhile. Heat helps also. Another issue is his frequent urinations. He has BPH and his urologist has been treating him with Finasteride. He asks if the diuretic in his BP medication could be adding to this problem. He used to have a problem with swelling in the feet, but this has resolved. No SOB or chest pain.   Review of Systems  Constitutional: Negative.   Respiratory: Negative.   Cardiovascular: Negative.   Genitourinary: Positive for urgency, frequency and difficulty urinating. Negative for dysuria.  Musculoskeletal: Positive for neck pain and neck stiffness.       Objective:   Physical Exam  Constitutional: He appears well-developed and well-nourished.  Cardiovascular: Normal rate, regular rhythm, normal heart sounds and intact distal pulses.   Pulmonary/Chest: Effort normal and breath sounds normal.  Musculoskeletal: He exhibits no edema.  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 Use 800 mg Ibuprofen prn. His HTN is well controlled. We will stop the HCTZ and switch him to plain Lisinopril 20 mg daily.

## 2015-06-05 NOTE — Progress Notes (Signed)
Pre visit review using our clinic review tool, if applicable. No additional management support is needed unless otherwise documented below in the visit note. 

## 2015-06-13 ENCOUNTER — Ambulatory Visit: Payer: BC Managed Care – PPO | Admitting: Family Medicine

## 2015-06-13 ENCOUNTER — Ambulatory Visit (INDEPENDENT_AMBULATORY_CARE_PROVIDER_SITE_OTHER): Payer: BC Managed Care – PPO | Admitting: Family Medicine

## 2015-06-13 ENCOUNTER — Encounter: Payer: Self-pay | Admitting: Family Medicine

## 2015-06-13 VITALS — BP 136/73 | HR 71 | Temp 98.5°F | Ht 72.5 in | Wt 198.0 lb

## 2015-06-13 DIAGNOSIS — J209 Acute bronchitis, unspecified: Secondary | ICD-10-CM | POA: Diagnosis not present

## 2015-06-13 MED ORDER — HYDROCODONE-HOMATROPINE 5-1.5 MG/5ML PO SYRP: 5.0000 mL | ORAL_SOLUTION | ORAL | Status: DC | PRN

## 2015-06-13 MED ORDER — AZITHROMYCIN 250 MG PO TABS: ORAL_TABLET | ORAL | Status: DC

## 2015-06-13 NOTE — Progress Notes (Signed)
   Subjective:    Patient ID: [name], male    DOB: [birth year], 64 y.o.   MRN: [id number]  HPI Here for 4 days of stuffy head, PND, a bad ST, chest  tightness and a dry cough. Drinking water.    Review of Systems  Constitutional: Negative.   HENT: Positive for congestion and postnasal drip. Negative for sinus pressure.   Eyes: Negative.   Respiratory: Positive for cough and chest tightness. Negative for shortness of breath and wheezing.   Cardiovascular: Negative.        Objective:   Physical Exam  Constitutional: He appears well-developed and well-nourished.  HENT:  Right Ear: External ear normal.  Left Ear: External ear normal.  Nose: Nose normal.  Mouth/Throat: Oropharynx is clear and moist.  Eyes: Conjunctivae are normal.  Neck: No thyromegaly present.  Pulmonary/Chest: Effort normal. No respiratory distress. He has no wheezes. He has no rales.  Scattered rhonchi  Lymphadenopathy:    He has no cervical adenopathy.          Assessment & Plan:  Bronchitis, treat with a Zpack.

## 2015-06-13 NOTE — Progress Notes (Signed)
Pre visit review using our clinic review tool, if applicable. No additional management support is needed unless otherwise documented below in the visit note. 

## 2015-06-14 ENCOUNTER — Ambulatory Visit: Payer: BC Managed Care – PPO | Admitting: Family Medicine

## 2015-08-31 ENCOUNTER — Other Ambulatory Visit: Payer: Self-pay | Admitting: Family Medicine

## 2015-09-01 ENCOUNTER — Other Ambulatory Visit: Payer: Self-pay | Admitting: Family Medicine

## 2015-09-03 ENCOUNTER — Encounter: Payer: Self-pay | Admitting: Family Medicine

## 2015-09-04 MED ORDER — OMEPRAZOLE 20 MG PO CPDR: 20.0000 mg | DELAYED_RELEASE_CAPSULE | Freq: Two times a day (BID) | ORAL | Status: DC

## 2015-09-04 NOTE — Telephone Encounter (Signed)
Call in #120 with 5 rf 

## 2015-09-04 NOTE — Telephone Encounter (Signed)
Change the rx to Omeprazole 20 mg bid, call in #[phone] with 3 rf

## 2015-10-15 ENCOUNTER — Ambulatory Visit (INDEPENDENT_AMBULATORY_CARE_PROVIDER_SITE_OTHER)
Admission: RE | Admit: 2015-10-15 | Discharge: 2015-10-15 | Disposition: A | Discharge status: Home / Self Care | Payer: Medicare Other | Source: Ambulatory Visit | Attending: Family Medicine | Admitting: Family Medicine

## 2015-10-15 ENCOUNTER — Ambulatory Visit (INDEPENDENT_AMBULATORY_CARE_PROVIDER_SITE_OTHER): Payer: Medicare Other | Admitting: Family Medicine

## 2015-10-15 ENCOUNTER — Encounter: Payer: Self-pay | Admitting: Family Medicine

## 2015-10-15 VITALS — BP 148/90 | Temp 98.7°F | Ht 72.5 in | Wt 198.1 lb

## 2015-10-15 DIAGNOSIS — F411 Generalized anxiety disorder: Secondary | ICD-10-CM | POA: Diagnosis not present

## 2015-10-15 DIAGNOSIS — M542 Cervicalgia: Secondary | ICD-10-CM

## 2015-10-15 DIAGNOSIS — I10 Essential (primary) hypertension: Secondary | ICD-10-CM | POA: Diagnosis not present

## 2015-10-15 DIAGNOSIS — G8929 Other chronic pain: Secondary | ICD-10-CM

## 2015-10-15 MED ORDER — AMLODIPINE BESYLATE 5 MG PO TABS: 5.0000 mg | ORAL_TABLET | Freq: Every day | ORAL | Status: DC

## 2015-10-15 MED ORDER — DULOXETINE HCL 30 MG PO CPEP: 30.0000 mg | ORAL_CAPSULE | Freq: Every day | ORAL | Status: DC

## 2015-10-15 NOTE — Progress Notes (Signed)
   Subjective:    Patient ID: [name], male    DOB: 1951/09/20, 65 y.o.   MRN: [id number]  HPI Here for several things. First his BP has been running high for the past few months, averaging in the Q000111Q systolic. He sometimes feels flushed in the face or as a mild headache. Also he has felt a little more anxious than usual. He is on 20 mg of Cymbalta and he thinks this dose may be too low. Last he is having more pain in the right neck and this radiates down to the right shoulder. Using Advil once or twice a day.    Review of Systems  Constitutional: Negative.   Respiratory: Negative.   Cardiovascular: Negative.   Musculoskeletal: Positive for neck pain.  Neurological: Positive for headaches.  Psychiatric/Behavioral: Negative for dysphoric mood and decreased concentration. The patient is nervous/anxious.        Objective:   Physical Exam  Constitutional: He is oriented to person, place, and time. He appears well-developed and well-nourished.  Eyes: Conjunctivae are normal. Pupils are equal, round, and reactive to light.  Neck: No thyromegaly present.  Cardiovascular: Normal rate, regular rhythm, normal heart sounds and intact distal pulses.   Pulmonary/Chest: Effort normal and breath sounds normal.  Musculoskeletal:  The neck has full ROM but he is tender on the right posterior neck and across the upper right trapezius  Lymphadenopathy:    He has no cervical adenopathy.  Neurological: He is alert and oriented to person, place, and time.  Psychiatric: He has a normal mood and affect. His behavior is normal. Thought content normal.          Assessment & Plan:  For the HTN we will add Amlodipine 5 mg daily. For the anxiety we will increase the Cynmbalta to 30 mg daily. His neck pain likely arises from degenerative changes in the neck. We will get Xrays of the cervical spine.

## 2015-10-15 NOTE — Progress Notes (Signed)
Pre visit review using our clinic review tool, if applicable. No additional management support is needed unless otherwise documented below in the visit note. 

## 2015-10-19 ENCOUNTER — Encounter: Payer: Self-pay | Admitting: Family Medicine

## 2015-10-19 NOTE — Telephone Encounter (Signed)
I am glad the higher dose of Duloxetine is helping. As for the neck pain, I think he would benefit from a course of PT. If he wishes, I can do a referral

## 2015-11-01 ENCOUNTER — Ambulatory Visit (INDEPENDENT_AMBULATORY_CARE_PROVIDER_SITE_OTHER): Payer: Medicare Other | Admitting: Family Medicine

## 2015-11-01 ENCOUNTER — Encounter: Payer: Self-pay | Admitting: Family Medicine

## 2015-11-01 VITALS — BP 142/90 | HR 86 | Temp 98.2°F | Ht 72.5 in | Wt 193.4 lb

## 2015-11-01 DIAGNOSIS — J019 Acute sinusitis, unspecified: Secondary | ICD-10-CM | POA: Diagnosis not present

## 2015-11-01 MED ORDER — AMOXICILLIN-POT CLAVULANATE 875-125 MG PO TABS: 1.0000 | ORAL_TABLET | Freq: Two times a day (BID) | ORAL | Status: DC

## 2015-11-01 MED ORDER — HYDROCODONE-HOMATROPINE 5-1.5 MG/5ML PO SYRP: 5.0000 mL | ORAL_SOLUTION | Freq: Four times a day (QID) | ORAL | Status: DC | PRN

## 2015-11-01 NOTE — Progress Notes (Signed)
   Subjective:    Patient ID: [name], male    DOB: 1951-04-10, 65 y.o.   MRN: [id number]  HPI Acute visit for sinus congestive symptoms Onset about 2 weeks ago. Initially had some sore throat and dry cough. Now has right maxillary facial pain and some right frontal sinus pain Intermittent headache. Increased malaise. Initially had some thick yellow mucus and now very little drainage. Similar infection year ago which eventually resolved with Augmentin. Cough sometimes severe at night. Leftover Hycodan cough syrup helped  Past Medical History  Diagnosis Date  . Anxiety   . GERD (gastroesophageal reflux disease)   . Hyperlipidemia   . Hypertension   . ED (erectile dysfunction)   . Kidney stone 07-29-11    passed   . Vitreous floater     left eye, sees Dr. Dawna Part at [facility]    Past Surgical History  Procedure Laterality Date  . Hernia repair    . Basal cell carcinoma excision      removed from scalp  . Colonoscopy  07-09-07    per Dr. Olevia Perches, clear, repeat in 10 yrs     reports that he has never smoked. He has never used smokeless tobacco. He reports that he does not drink alcohol or use illicit drugs. family history includes Hyperlipidemia in his mother; Hypertension in his mother. Allergies  Allergen Reactions  . Codeine       Review of Systems  Constitutional: Positive for fatigue. Negative for fever and chills.  HENT: Positive for congestion and sinus pressure. Negative for sore throat.   Respiratory: Positive for cough.   Neurological: Positive for headaches.       Objective:   Physical Exam  Constitutional: He appears well-developed and well-nourished.  HENT:  Right Ear: External ear normal.  Left Ear: External ear normal.  Nose: Nose normal.  Mouth/Throat: Oropharynx is clear and moist.  Neck: Neck supple.  Cardiovascular: Normal rate and regular rhythm.   Pulmonary/Chest: Effort normal and breath sounds normal. No respiratory  distress. He has no wheezes. He has no rales.  Lymphadenopathy:    He has no cervical adenopathy.          Assessment & Plan:  Acute sinusitis-right maxillary and right frontal. Augmentin 875 mg twice daily for 10 days. Consider over-the-counter Mucinex. Stay well-hydrated. Consider saline nasal irrigation. Refill Hycodan cough syrup 120 mL's 1 teaspoon every 6 hours for severe cough

## 2015-11-01 NOTE — Patient Instructions (Signed)

## 2015-11-01 NOTE — Progress Notes (Signed)
Pre visit review using our clinic review tool, if applicable. No additional management support is needed unless otherwise documented below in the visit note. 

## 2015-11-28 ENCOUNTER — Ambulatory Visit (INDEPENDENT_AMBULATORY_CARE_PROVIDER_SITE_OTHER): Payer: Medicare Other | Admitting: Family Medicine

## 2015-11-28 ENCOUNTER — Encounter: Payer: Self-pay | Admitting: Family Medicine

## 2015-11-28 VITALS — BP 137/77 | HR 72 | Temp 98.2°F | Ht 72.05 in | Wt 192.0 lb

## 2015-11-28 DIAGNOSIS — J0101 Acute recurrent maxillary sinusitis: Secondary | ICD-10-CM | POA: Diagnosis not present

## 2015-11-28 MED ORDER — LEVOFLOXACIN 500 MG PO TABS: 500.0000 mg | ORAL_TABLET | Freq: Every day | ORAL | Status: AC

## 2015-11-28 NOTE — Progress Notes (Signed)
   Subjective:    Patient ID: [name], male    DOB: 25-Jun-1951, 65 y.o.   MRN: [id number]  HPI Here for a recurrent sinus infection. He was here one month ago for a right maxillary sinusitis and was treated with 10 days of Augmentin. His symptoms improved as a result of this, but now for 4 days they have returned like before. He has pressure and congestion over the right cheek and is blowing green mucus from the nose. No fever or cough.    Review of Systems  Constitutional: Negative.   HENT: Positive for congestion, postnasal drip and sinus pressure. Negative for ear pain and sore throat.   Eyes: Negative.   Respiratory: Negative.        Objective:   Physical Exam  Constitutional: He appears well-developed and well-nourished.  HENT:  Right Ear: External ear normal.  Left Ear: External ear normal.  Nose: Nose normal.  Mouth/Throat: Oropharynx is clear and moist.  Eyes: Conjunctivae are normal.  Neck: No thyromegaly present.  Pulmonary/Chest: Effort normal and breath sounds normal.  Lymphadenopathy:    He has no cervical adenopathy.          Assessment & Plan:  Recurrent sinusitis, treat with 14 days of Levaquin.

## 2015-11-28 NOTE — Progress Notes (Signed)
Pre visit review using our clinic review tool, if applicable. No additional management support is needed unless otherwise documented below in the visit note. 

## 2015-12-21 ENCOUNTER — Other Ambulatory Visit: Payer: Self-pay | Admitting: Family Medicine

## 2016-01-18 ENCOUNTER — Other Ambulatory Visit: Payer: Self-pay | Admitting: Family Medicine

## 2016-01-23 ENCOUNTER — Telehealth: Payer: Self-pay | Admitting: Family Medicine

## 2016-01-23 NOTE — Telephone Encounter (Signed)
Ok

## 2016-01-23 NOTE — Telephone Encounter (Signed)
Pt is on Dr. Barbie Banner schedule for Friday May 5 for a wellness exam. Pt will need to schedule with Evalyn Casco, she will be conducting these type of exams and should be starting here this month. We just saw pt recently for his physical. Can you call pt to reschedule with Manuela Schwartz?

## 2016-01-23 NOTE — Telephone Encounter (Signed)
Appt cancelled and pt will call back to schedule the Medicare Wellness Check with Manuela Schwartz.

## 2016-01-25 ENCOUNTER — Ambulatory Visit: Payer: Medicare Other | Admitting: Family Medicine

## 2016-02-27 ENCOUNTER — Other Ambulatory Visit: Payer: Self-pay | Admitting: Family Medicine

## 2016-03-07 ENCOUNTER — Ambulatory Visit (INDEPENDENT_AMBULATORY_CARE_PROVIDER_SITE_OTHER): Payer: Medicare Other

## 2016-03-07 VITALS — BP 140/80 | HR 79 | Ht 73.0 in | Wt 192.4 lb

## 2016-03-07 DIAGNOSIS — Z0000 Encounter for general adult medical examination without abnormal findings: Secondary | ICD-10-CM

## 2016-03-07 NOTE — Progress Notes (Addendum)
Subjective:   [name] is a 65 y.o. male who presents for Medicare Annual/ Initial Medicare Wellness exam   Review of Systems:  HRA assessment completed during this visit with [name]  The Patient was informed that the wellness visit is to identify their future health risk and educate and initiate measures that can reduce risk for increased disease through the lifespan.    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 Recommended he get ergonomic screen if he does this next year. Never been married/ no children; has 2 yo dog  Tobacco: never ETOH; no use   Medications; had flexeril one time a day; has not refilled but feels it did help some; [name] nurse recommended he get refilled.  Also states he is taking cymbalta 88 (old refill) as the 30mg  he no longer needed since stress resolved; Needs refill of 20 Cymbalta but has Rx currently.  BMI: 25  Diet;   When he is not working - he has cereal with fruit Working: sliced apple with granola bar; Drinks water  Lunch; had green salad; grilled chicken; sliced almonds Salmon baked; chicken; boils chicken; squash;  Summer veggies Eats a lot of yogurt;   cup of walnuts and roasted almonds  Infrequent snack;  Reads labels; monitors sodium;   Teeth or Denture issues? Q 6 months;   Exercise;  on hold with degenerative disc and PT No request order for PT as he has time to go;   To note; has steps at home; generally walks at home for exercise  may try low impact aerobics in water  Will  fup with [name] for PT order and then will determine exercise to keep back strong; also discussed the loss of muscle tone after 65   [place]; 2 story home;  Fall hx; no Given education on "Fall Prevention in the Home" for more safety tips the patient can apply as appropriate.  Long term goal is to "age in place" or undecided   Community safety; YES  Smoke detectors YES Firearms safety reviewed and will keep in a safe place if these exist. Difficulty Driving? no   Accidents; no;  Wears seatbelt- yes  Advised to use sun protection or large brim hat/ wears sun protection in the sun; Dermatology q year for full body check with hx melanoma  Stressors (1-5)  2   Mental Health:   Any emotional problems? Anxious, depressed, irritable, sad or blue? No;   Denies feeling depressed or hopeless; voices pleasure in daily life no How many social activities have you been engaged in within the last 2 weeks? Yes  Enjoys to have down time   Some lack of sleep but discuss sleep etiquette; Has good nights; Urologist trying Myrbetriq 25 po to decrease freq at hs but does not think this has helped.   Cognitive;    Ad8 score is 0 / mother had alz and discussed  [employer] nurse completed the MMSE but decided to complete today for the records Has anxious component but score 30/30;  Fall assessment: no   Any dizziness when standing up? no Gait assessment appears normal; is normal  Mobilization and Functional losses from last year to this year? No   Sleep pattern changes; discussed, taking melatonin as advised by [name]  Urinary or fecal incontinence reviewed: no   Counseling Health Maintenance Gaps: Hep c due; will add hep c at next blood draw  Request TSH drawn; night sweats occasionally; insomnia; was told by insurance nurse to check TSH; defer to [name] with fup apt Colonoscopy; had [place] 07/09/07 repeat in 10years; 06/2017  ( to note; epic listed as due because test is listed as endoscopic-  will postpone until due date per colonoscopy)   EKG: 03/2015  Hearing: has ringing in ears  May impact results Right ear - 2000  Left ear 2000  States problem is not significant  Ophthalmology exam- eye exam every year; [name] at [name] were  tired; ptosis; no issues dx x early cataracts. Deferred eye exam in the office due to annual fup  Immunizations Due: PSV 23 due will defer to fup with [name]   Advanced Directive addressed; Completed   Individual Goal: to exercise more  Health Recommendations and Referrals To fup with [name] for refills; PT for neck pain and insomnia  Will make a fup apt upon leaving today  Barriers to Success None noted; Very motivated and accountable  Likes to communicate via my chart   Current Care Team reviewed and updated [facility]; [name]  [name] - dermatology specialist  Education provided and lifestyle risk discussed   All Health Maintenance Gaps Reviewed for closure   Cardiac Risk Factors include: advanced age (>74men, >83 women);dyslipidemia;family history of premature cardiovascular disease;hypertension;male gender     Objective:    Vitals: BP 140/80 mmHg  Pulse 79  Ht 6\' 1"  (1.854 m)  Wt 192 lb 6 oz (87.261 kg)  BMI 25.39 kg/m2  SpO2 98%  Body mass index is 25.39 kg/(m^2).   Feels BP has improved;   Tobacco History  Smoking status  . Never Smoker   Smokeless tobacco  . Never Used     Counseling given: Yes   Past Medical History  Diagnosis Date  . Anxiety   . GERD (gastroesophageal reflux disease)   . Hyperlipidemia   . Hypertension   . ED (erectile dysfunction)   . Kidney stone 07-29-11    passed   . Vitreous floater     left eye, sees [name] at [name]    Past Surgical History  Procedure Laterality Date  . Hernia repair    . Basal cell carcinoma excision      removed from scalp  . Colonoscopy  07-09-07    per [name], clear, repeat in 10 yrs     Family History  Problem Relation Age of Onset  . Depression    . Diabetes    . Hyperlipidemia    . Hypertension    . Alzheimer's disease    . Hypertension Mother   . Hyperlipidemia Mother    History  Sexual Activity  . Sexual Activity: Not on file    Outpatient Encounter Prescriptions as of 03/07/2016  Medication Sig  . amLODipine (NORVASC) 5 MG tablet TAKE 1 TABLET (5 MG TOTAL) BY MOUTH DAILY.  . cyclobenzaprine (FLEXERIL) 10 MG tablet TAKE 1 TABLET (10 MG TOTAL) BY MOUTH 3 (THREE) TIMES DAILY AS NEEDED. (Patient taking differently: Take 10 mg by  mouth daily as needed. TAKE 1 TABLET (10 MG TOTAL) BY MOUTH 3 (THREE) TIMES DAILY AS NEEDED.)  . DULoxetine (CYMBALTA) 30 MG capsule Take 1 capsule (30 mg total) by mouth daily.  . finasteride ([name]) 5 MG tablet Take 1 tablet (5 mg total) by mouth daily.  Marland Kitchen ibuprofen (ADVIL,MOTRIN) 800 MG tablet TAKE 1 TABLET (800 MG TOTAL) BY MOUTH EVERY 6 (SIX) HOURS AS NEEDED FOR MODERATE PAIN.  Marland Kitchen lisinopril (PRINIVIL,ZESTRIL) 20 MG tablet TAKE 1 TABLET (20 MG TOTAL) BY MOUTH DAILY.  Marland Kitchen LORazepam (ATIVAN) 1 MG tablet TAKE 1 TABLET BY MOUTH EVERY 6 HOURS AS NEEDED  . mirabegron ER (MYRBETRIQ) 25 MG TB24 tablet Take 25 mg by mouth daily.  Marland Kitchen omeprazole (PRILOSEC) 20 MG capsule Take 1 capsule (20 mg total) by mouth 2 (two) times daily before a meal.  . HYDROcodone-homatropine (HYDROMET) 5-1.5 MG/5ML syrup Take 5 mLs by mouth every 6 (six) hours as needed. (Patient not taking: Reported on 11/28/2015)   No facility-administered encounter medications on file as of 03/07/2016.    Activities of Daily Living In your present state of health, do you have any difficulty performing the following activities: 03/07/2016  Vision? N  Difficulty concentrating or making decisions? N  Walking or climbing stairs? N  Dressing or bathing? N  Doing errands, shopping? N  Preparing Food and eating ? N  Using the Toilet? N  In the past six months, have you accidently leaked  urine? (No Data)  Do you have problems with loss of bowel control? N  Managing your Medications? N  Managing your Finances? N  Housekeeping or managing your Housekeeping? N    Patient Care Team: [name], MD as PCP - General   Assessment:     Exercise Activities and Dietary recommendations Current Exercise Habits: Home exercise routine, Type of exercise: walking, Time (Minutes): 30, Frequency (Times/Week): 4 (walks dog; interval walking), Weekly Exercise (Minutes/Week): 120, Intensity: Mild  Goals    . patient     Continue to focus on physical activity     will start some pool exercise  Fall Risk Fall Risk  03/07/2016  Falls in the past year? No   Depression Screen PHQ 2/9 Scores 03/07/2016  PHQ - 2 Score 0    Cognitive Testing MMSE - Mini Mental State Exam 03/07/2016  Orientation to time 5  Orientation to Place 5  Registration 3  Attention/ Calculation 5  Recall 3  Language- name 2 objects 2  Language- repeat 1  Language- follow 3 step command 3  Language- read & follow direction 1  Write a sentence 1  Copy design 1  Total score 30    Immunization History  Administered Date(s) Administered  . Influenza Split 08/13/2011, 06/08/2012, 06/22/2013  . Influenza Whole 06/22/2005, 06/19/2010  . Influenza-Unspecified 07/14/2015  . Pneumococcal Conjugate-13 08/11/2013  . Td 06/19/2010  . Tdap 09/12/2013  . Zoster 08/11/2013   Screening Tests Health Maintenance  Topic Date Due  . Hepatitis C Screening  02-14-1951  . PNA vac Low Risk Adult (2 of 2 - PPSV23) 09/10/2015  . COLONOSCOPY  07/08/2017 (Originally 09/09/2000)  . INFLUENZA VACCINE  04/22/2016  . TETANUS/TDAP  09/13/2023  . ZOSTAVAX  Completed  . HIV Screening  Completed      Plan:      Will order Hep c with next blood draw; educated today To have PSV 23 next visit with [name] To up on med refills (cymbalta 20 (doesn't need 30) and  cyclobenzaprine 1 or 2 a day to refill; Will re-evaluate neck  pain, which he feels is better but would like to have PT [name] discussed in Jan;  Would also like TSH drawn to see if this is related to hs insomnia and night sweats.   During the course of the visit the patient was educated and counseled about the following appropriate screening and preventive services:   Vaccines to include Pneumoccal, Influenza, Hepatitis B, Td, Zostavax, HCV  Electrocardiogram/ 03/2015  Cardiovascular Disease/ BP needs to come down some; working on this; watches sodium / will increase exercise   Colorectal cancer screening/ due 06/2017; report is listed as endoscopic but was a colonoscopy   Diabetes screening/ neg  Prostate Cancer Screening/ followed by urology  Glaucoma screening/ eye check q 6 months; complaint   Nutrition counseling / adequate   Smoking cessation counseling n/a   Patient Instructions (the written plan) was given to the patient.    Wynetta Fines, RN  03/07/2016

## 2016-03-07 NOTE — Patient Instructions (Addendum)
[name] , Thank you for taking time to come for your Medicare Wellness Visit. I appreciate your ongoing commitment to your health goals. Please review the following plan we discussed and let me know if I can assist you in the future.   Please schedule a fup apt with Dr. Sarajane Jews as soon as one is available. 1. Med refills; cyclobenzapine reorder  2, refill cymbalta 20mg  instead of 30 3. To discuss poss TSH draw for insomnia  4. fup PT for neck pain and recommendations;  Will need regular labs in July    These are the goals we discussed: goals were to exercise more and may start with water aerobics Does a lot of walking and stairs at home.   Goals    None      This is a list of the screening recommended for you and due dates:  Health Maintenance  Topic Date Due  .  Hepatitis C: One time screening is recommended by Center for Disease Control  (CDC) for  adults born from [birth year] through 1966.   1951/02/17  . Colon Cancer Screening  09/09/2000  . Pneumonia vaccines (2 of 2 - PPSV23) 09/10/2015  . Flu Shot  04/22/2016  . Tetanus Vaccine  09/13/2023  . Shingles Vaccine  Completed  . HIV Screening  Completed     Fall Prevention in the Home  Falls can cause injuries. They can happen to people of all ages. There are many things you can do to make your home safe and to help prevent falls.  WHAT CAN I DO ON THE OUTSIDE OF MY HOME?  Regularly fix the edges of walkways and driveways and fix any cracks.  Remove anything that might make you trip as you walk through a door, such as a raised step or threshold.  Trim any bushes or trees on the path to your home.  Use bright outdoor lighting.  Clear any walking paths of anything that might make someone trip, such as rocks or tools.  Regularly check to see if handrails are loose or broken. Make sure that both sides of any steps have handrails.  Any raised decks and porches should have guardrails on the edges.  Have any leaves, snow, or ice  cleared regularly.  Use sand or salt on walking paths during winter.  Clean up any spills in your garage right away. This includes oil or grease spills. WHAT CAN I DO IN THE BATHROOM?   Use night lights.  Install grab bars by the toilet and in the tub and shower. Do not use towel bars as grab bars.  Use non-skid mats or decals in the tub or shower.  If you need to sit down in the shower, use a plastic, non-slip stool.  Keep the floor dry. Clean up any water that spills on the floor as soon as it happens.  Remove soap buildup in the tub or shower regularly.  Attach bath mats securely with double-sided non-slip rug tape.  Do not have throw rugs and other things on the floor that can make you trip. WHAT CAN I DO IN THE BEDROOM?  Use night lights.  Make sure that you have a light by your bed that is easy to reach.  Do not use any sheets or blankets that are too big for your bed. They should not hang down onto the floor.  Have a firm chair that has side arms. You can use this for support while you get dressed.  Do not  have throw rugs and other things on the floor that can make you trip. WHAT CAN I DO IN THE KITCHEN?  Clean up any spills right away.  Avoid walking on wet floors.  Keep items that you use a lot in easy-to-reach places.  If you need to reach something above you, use a strong step stool that has a grab bar.  Keep electrical cords out of the way.  Do not use floor polish or wax that makes floors slippery. If you must use wax, use non-skid floor wax.  Do not have throw rugs and other things on the floor that can make you trip. WHAT CAN I DO WITH MY STAIRS?  Do not leave any items on the stairs.  Make sure that there are handrails on both sides of the stairs and use them. Fix handrails that are broken or loose. Make sure that handrails are as long as the stairways.  Check any carpeting to make sure that it is firmly attached to the stairs. Fix any carpet that  is loose or worn.  Avoid having throw rugs at the top or bottom of the stairs. If you do have throw rugs, attach them to the floor with carpet tape.  Make sure that you have a light switch at the top of the stairs and the bottom of the stairs. If you do not have them, ask someone to add them for you. WHAT ELSE CAN I DO TO HELP PREVENT FALLS?  Wear shoes that:  Do not have high heels.  Have rubber bottoms.  Are comfortable and fit you well.  Are closed at the toe. Do not wear sandals.  If you use a stepladder:  Make sure that it is fully opened. Do not climb a closed stepladder.  Make sure that both sides of the stepladder are locked into place.  Ask someone to hold it for you, if possible.  Clearly mark and make sure that you can see:  Any grab bars or handrails.  First and last steps.  Where the edge of each step is.  Use tools that help you move around (mobility aids) if they are needed. These include:  Canes.  Walkers.  Scooters.  Crutches.  Turn on the lights when you go into a dark area. Replace any light bulbs as soon as they burn out.  Set up your furniture so you have a clear path. Avoid moving your furniture around.  If any of your floors are uneven, fix them.  If there are any pets around you, be aware of where they are.  Review your medicines with your doctor. Some medicines can make you feel dizzy. This can increase your chance of falling. Ask your doctor what other things that you can do to help prevent falls.   This information is not intended to replace advice given to you by your health care provider. Make sure you discuss any questions you have with your health care provider.   Document Released: 07/05/2009 Document Revised: 01/23/2015 Document Reviewed: 10/13/2014 Elsevier Interactive Patient Education 2016 [place] Maintenance, Male A healthy lifestyle and preventative care can promote health and wellness.  Maintain  regular health, dental, and eye exams.  Eat a healthy diet. Foods like vegetables, fruits, whole grains, low-fat dairy products, and lean protein foods contain the nutrients you need and are low in calories. Decrease your intake of foods high in solid fats, added sugars, and salt. Get information about a proper diet from your health  care provider, if necessary.  Regular physical exercise is one of the most important things you can do for your health. Most adults should get at least 150 minutes of moderate-intensity exercise (any activity that increases your heart rate and causes you to sweat) each week. In addition, most adults need muscle-strengthening exercises on 2 or more days a week.   Maintain a healthy weight. The body mass index (BMI) is a screening tool to identify possible weight problems. It provides an estimate of body fat based on height and weight. Your health care provider can find your BMI and can help you achieve or maintain a healthy weight. For males 20 years and older:  A BMI below 18.5 is considered underweight.  A BMI of 18.5 to 24.9 is normal.  A BMI of 25 to 29.9 is considered overweight.  A BMI of 30 and above is considered obese.  Maintain normal blood lipids and cholesterol by exercising and minimizing your intake of saturated fat. Eat a balanced diet with plenty of fruits and vegetables. Blood tests for lipids and cholesterol should begin at age 41 and be repeated every 5 years. If your lipid or cholesterol levels are high, you are over age 59, or you are at high risk for heart disease, you may need your cholesterol levels checked more frequently.Ongoing high lipid and cholesterol levels should be treated with medicines if diet and exercise are not working.  If you smoke, find out from your health care provider how to quit. If you do not use tobacco, do not start.  Lung cancer screening is recommended for adults aged 37-80 years who are at high risk for developing  lung cancer because of a history of smoking. A yearly low-dose CT scan of the lungs is recommended for people who have at least a 30-pack-year history of smoking and are current smokers or have quit within the past 15 years. A pack year of smoking is smoking an average of 1 pack of cigarettes a day for 1 year (for example, a 30-pack-year history of smoking could mean smoking 1 pack a day for 30 years or 2 packs a day for 15 years). Yearly screening should continue until the smoker has stopped smoking for at least 15 years. Yearly screening should be stopped for people who develop a health problem that would prevent them from having lung cancer treatment.  If you choose to drink alcohol, do not have more than 2 drinks per day. One drink is considered to be 12 oz (360 mL) of beer, 5 oz (150 mL) of wine, or 1.5 oz (45 mL) of liquor.  Avoid the use of street drugs. Do not share needles with anyone. Ask for help if you need support or instructions about stopping the use of drugs.  High blood pressure causes heart disease and increases the risk of stroke. High blood pressure is more likely to develop in:  People who have blood pressure in the end of the normal range (100-139/85-89 mm Hg).  People who are overweight or obese.  People who are African American.  If you are 58-35 years of age, have your blood pressure checked every 3-5 years. If you are 32 years of age or older, have your blood pressure checked every year. You should have your blood pressure measured twice--once when you are at a hospital or clinic, and once when you are not at a hospital or clinic. Record the average of the two measurements. To check your blood pressure when you  are not at a hospital or clinic, you can use:  An automated blood pressure machine at a pharmacy.  A home blood pressure monitor.  If you are 70-84 years old, ask your health care provider if you should take aspirin to prevent heart disease.  Diabetes screening  involves taking a blood sample to check your fasting blood sugar level. This should be done once every 3 years after age 44 if you are at a normal weight and without risk factors for diabetes. Testing should be considered at a younger age or be carried out more frequently if you are overweight and have at least 1 risk factor for diabetes.  Colorectal cancer can be detected and often prevented. Most routine colorectal cancer screening begins at the age of 66 and continues through age 64. However, your health care provider may recommend screening at an earlier age if you have risk factors for colon cancer. On a yearly basis, your health care provider may provide home test kits to check for hidden blood in the stool. A small camera at the end of a tube may be used to directly examine the colon (sigmoidoscopy or colonoscopy) to detect the earliest forms of colorectal cancer. Talk to your health care provider about this at age 48 when routine screening begins. A direct exam of the colon should be repeated every 5-10 years through age 39, unless early forms of precancerous polyps or small growths are found.  People who are at an increased risk for hepatitis B should be screened for this virus. You are considered at high risk for hepatitis B if:  You were born in a country where hepatitis B occurs often. Talk with your health care provider about which countries are considered high risk.  Your parents were born in a high-risk country and you have not received a shot to protect against hepatitis B (hepatitis B vaccine).  You have HIV or AIDS.  You use needles to inject street drugs.  You live with, or have sex with, someone who has hepatitis B.  You are a man who has sex with other men (MSM).  You get hemodialysis treatment.  You take certain medicines for conditions like cancer, organ transplantation, and autoimmune conditions.  Hepatitis C blood testing is recommended for all people born from [birth year]  through 1965 and any individual with known risk factors for hepatitis C.  Healthy men should no longer receive prostate-specific antigen (PSA) blood tests as part of routine cancer screening. Talk to your health care provider about prostate cancer screening.  Testicular cancer screening is not recommended for adolescents or adult males who have no symptoms. Screening includes self-exam, a health care provider exam, and other screening tests. Consult with your health care provider about any symptoms you have or any concerns you have about testicular cancer.  Practice safe sex. Use condoms and avoid high-risk sexual practices to reduce the spread of sexually transmitted infections (STIs).  You should be screened for STIs, including gonorrhea and chlamydia if:  You are sexually active and are younger than 24 years.  You are older than 24 years, and your health care provider tells you that you are at risk for this type of infection.  Your sexual activity has changed since you were last screened, and you are at an increased risk for chlamydia or gonorrhea. Ask your health care provider if you are at risk.  If you are at risk of being infected with HIV, it is recommended that you take a  prescription medicine daily to prevent HIV infection. This is called pre-exposure prophylaxis (PrEP). You are considered at risk if:  You are a man who has sex with other men (MSM).  You are a heterosexual man who is sexually active with multiple partners.  You take drugs by injection.  You are sexually active with a partner who has HIV.  Talk with your health care provider about whether you are at high risk of being infected with HIV. If you choose to begin PrEP, you should first be tested for HIV. You should then be tested every 3 months for as long as you are taking PrEP.  Use sunscreen. Apply sunscreen liberally and repeatedly throughout the day. You should seek shade when your shadow is shorter than you.  Protect yourself by wearing long sleeves, pants, a wide-brimmed hat, and sunglasses year round whenever you are outdoors.  Tell your health care provider of new moles or changes in moles, especially if there is a change in shape or color. Also, tell your health care provider if a mole is larger than the size of a pencil eraser.  A one-time screening for abdominal aortic aneurysm (AAA) and surgical repair of large AAAs by ultrasound is recommended for men aged 68-75 years who are current or former smokers.  Stay current with your vaccines (immunizations).   This information is not intended to replace advice given to you by your health care provider. Make sure you discuss any questions you have with your health care provider.   Document Released: 03/06/2008 Document Revised: 09/29/2014 Document Reviewed: 02/03/2011 Elsevier Interactive Patient Education [employer].

## 2016-03-11 ENCOUNTER — Ambulatory Visit (INDEPENDENT_AMBULATORY_CARE_PROVIDER_SITE_OTHER): Payer: Medicare Other

## 2016-03-11 ENCOUNTER — Encounter: Payer: Self-pay | Admitting: Sports Medicine

## 2016-03-11 ENCOUNTER — Ambulatory Visit (INDEPENDENT_AMBULATORY_CARE_PROVIDER_SITE_OTHER): Payer: Medicare Other | Admitting: Sports Medicine

## 2016-03-11 DIAGNOSIS — M79671 Pain in right foot: Secondary | ICD-10-CM

## 2016-03-11 DIAGNOSIS — M792 Neuralgia and neuritis, unspecified: Secondary | ICD-10-CM

## 2016-03-11 DIAGNOSIS — M722 Plantar fascial fibromatosis: Secondary | ICD-10-CM | POA: Diagnosis not present

## 2016-03-11 MED ORDER — METHYLPREDNISOLONE 4 MG PO TBPK: ORAL_TABLET | ORAL | Status: DC

## 2016-03-11 NOTE — Patient Instructions (Signed)

## 2016-03-11 NOTE — Addendum Note (Signed)
Addended byWynetta Fines on: 03/11/2016 05:27 PM   Modules accepted: Level of Service, SmartSet

## 2016-03-11 NOTE — Progress Notes (Signed)
Patient ID: [name], male   DOB: 04/07/1951, 65 y.o.   MRN: [id number] Subjective: [name] is a 65 y.o. male patient presents to office with complaint of arch pain on right. Patient admits to post static dyskinesia for 4 weeks in duration started after misstep after getting out of the shower. States it is slowly getting better. Admits to a past history of plantar fasciitis with strain injury on same foot. Patient has treated this problem with good supportive shoes and icing, which has helped. Denies any other pedal complaints.   Patient Active Problem List   Diagnosis Date Noted  . Bilateral leg edema 06/05/2015  . Chronic neck pain 06/05/2015  . Plantar fasciitis 02/01/2014  . Visual floaters 04/04/2013  . Subjective vision disturbance, left 04/04/2013  . 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 MELANOMA, HX OF 06/01/2009  . NECK PAIN 09/11/2008  . CELLULITIS AND ABSCESS OF LEG EXCEPT FOOT 04/26/2008  . Acute sinusitis 01/06/2008  . ERECTILE DYSFUNCTION 10/12/2007  . HYPERLIPIDEMIA 04/12/2007  . Anxiety state 04/12/2007  . Essential hypertension 04/12/2007  . GERD 04/12/2007    Current Outpatient Prescriptions on File Prior to Visit  Medication Sig Dispense Refill  . amLODipine (NORVASC) 5 MG tablet TAKE 1 TABLET (5 MG TOTAL) BY MOUTH DAILY. 30 tablet 5  . cyclobenzaprine (FLEXERIL) 10 MG tablet TAKE 1 TABLET (10 MG TOTAL) BY MOUTH 3 (THREE) TIMES DAILY AS NEEDED. (Patient taking differently: Take 10 mg by mouth daily as needed. TAKE 1 TABLET (10 MG TOTAL) BY MOUTH 3 (THREE) TIMES DAILY AS NEEDED.) 60 tablet 3  . DULoxetine (CYMBALTA) 30 MG capsule Take 1 capsule (30 mg total) by mouth daily. 30 capsule 3  . finasteride ([name]) 5 MG tablet Take 1 tablet (5 mg total) by mouth daily. 1 tablet 0  . HYDROcodone-homatropine (HYDROMET) 5-1.5 MG/5ML syrup Take 5 mLs by mouth every 6 (six) hours as needed. (Patient not taking: Reported on 11/28/2015) 120 mL 0  . ibuprofen  (ADVIL,MOTRIN) 800 MG tablet TAKE 1 TABLET (800 MG TOTAL) BY MOUTH EVERY 6 (SIX) HOURS AS NEEDED FOR MODERATE PAIN. 60 tablet 5  . lisinopril (PRINIVIL,ZESTRIL) 20 MG tablet TAKE 1 TABLET (20 MG TOTAL) BY MOUTH DAILY. 30 tablet 5  . LORazepam (ATIVAN) 1 MG tablet TAKE 1 TABLET BY MOUTH EVERY 6 HOURS AS NEEDED 120 tablet 5  . mirabegron ER (MYRBETRIQ) 25 MG TB24 tablet Take 25 mg by mouth daily.    [name] omeprazole (PRILOSEC) 20 MG capsule Take 1 capsule (20 mg total) by mouth 2 (two) times daily before a meal. 60 capsule 3  . [DISCONTINUED] tadalafil (CIALIS) 20 MG tablet Take 20 mg by mouth daily as needed.       No current facility-administered medications on file prior to visit.    Allergies  Allergen Reactions  . Codeine     Objective: Physical Exam General: The patient is alert and oriented x3 in no acute distress.  Dermatology: Skin is warm, dry and supple bilateral lower extremities. Nails 1-10 are normal. There is no erythema, edema, no eccymosis, no open lesions present. Integument is otherwise unremarkable.  Vascular: Dorsalis Pedis pulse and Posterior Tibial pulse are 1/4 bilateral. Capillary fill time is immediate to all digits.  Neurological: Grossly intact to light touch with an achilles reflex of +2/5 and a  negative Tinel's sign bilateral.Subjective numbness and tingling, right greater than left foot; its to history of low back and hip pain, degenerative in nature.  Musculoskeletal: No  tenderness to palpation at the medial calcaneal tubercale, however, there is tenderness through the insertion of the plantar fascia at the arch on right foot. No pain with compression of calcaneus bilateral. No pain with tuning fork to calcaneus bilateral. No pain with calf compression bilateral. There is decreased Ankle joint range of motion bilateral. All other joints range of motion within normal limits bilateral. Strength 5/5 in all groups bilateral.   Xray, Right foot:  Normal osseous  mineralization. Joint spaces preserved. No fracture/dislocation/boney destruction. Calcaneal spur present with mild thickening of plantar fascia. Hammertoe and first MPJ joint space narrowing and second met head squaring, suggestive of early arthritic changes. No other soft tissue abnormalities or radiopaque foreign bodies.   Assessment and Plan: Problem List Items Addressed This Visit    None    Visit Diagnoses    Right foot pain    -  Primary    Relevant Medications    methylPREDNISolone (MEDROL DOSEPAK) 4 MG TBPK tablet    Other Relevant Orders    DG Foot 2 Views Right    Plantar fasciitis of right foot        Relevant Medications    methylPREDNISolone (MEDROL DOSEPAK) 4 MG TBPK tablet    Neuritis        Relevant Medications    methylPREDNISolone (MEDROL DOSEPAK) 4 MG TBPK tablet       -Complete examination performed.  -Xrays reviewed -Discussed with patient in detail the condition of plantar fasciitis, Distal arch, how this occurs and general treatment options. Explained both conservative and surgical treatments.  -Rx Medrol dose pack to take as instructed; patient cannot tolerate anti-inflammatories orally. -Recommended good supportive shoes and advised use of OTC insert. Explained to patient that if these orthoses work well, we will continue with these. If these do not improve his condition and  pain, we will consider custom molded orthoses. -Explained in detail the use of the fascial brace which was dispensed at today's visit. -Explained and dispensed to patient daily stretching exercises. -Recommend patient to ice affected area 1-2x daily. -Recommend neurology consultation for neuritis symptoms to rule out large fiber involvement due to history of hip and back arthritis -Patient to return to office in 4 weeks for follow up or sooner if problems or questions arise.  [name], DPM

## 2016-03-11 NOTE — Progress Notes (Signed)
I have reviewed and agree with the contents of this note.

## 2016-03-12 ENCOUNTER — Encounter: Payer: Self-pay | Admitting: Family Medicine

## 2016-03-12 ENCOUNTER — Telehealth: Payer: Self-pay | Admitting: *Deleted

## 2016-03-12 ENCOUNTER — Telehealth: Payer: Self-pay | Admitting: Family Medicine

## 2016-03-12 ENCOUNTER — Ambulatory Visit (INDEPENDENT_AMBULATORY_CARE_PROVIDER_SITE_OTHER): Payer: Medicare Other | Admitting: Family Medicine

## 2016-03-12 VITALS — BP 124/73 | HR 81 | Temp 98.5°F | Ht 73.0 in | Wt 192.0 lb

## 2016-03-12 DIAGNOSIS — R200 Anesthesia of skin: Secondary | ICD-10-CM

## 2016-03-12 DIAGNOSIS — Z209 Contact with and (suspected) exposure to unspecified communicable disease: Secondary | ICD-10-CM

## 2016-03-12 DIAGNOSIS — I10 Essential (primary) hypertension: Secondary | ICD-10-CM

## 2016-03-12 DIAGNOSIS — M542 Cervicalgia: Secondary | ICD-10-CM

## 2016-03-12 DIAGNOSIS — G8929 Other chronic pain: Secondary | ICD-10-CM

## 2016-03-12 DIAGNOSIS — F411 Generalized anxiety disorder: Secondary | ICD-10-CM | POA: Diagnosis not present

## 2016-03-12 MED ORDER — LORAZEPAM 1 MG PO TABS: 1.0000 mg | ORAL_TABLET | Freq: Four times a day (QID) | ORAL | Status: DC | PRN

## 2016-03-12 MED ORDER — DULOXETINE HCL 20 MG PO CPEP: 20.0000 mg | ORAL_CAPSULE | Freq: Every day | ORAL | Status: DC

## 2016-03-12 MED ORDER — CYCLOBENZAPRINE HCL 10 MG PO TABS: 10.0000 mg | ORAL_TABLET | Freq: Every day | ORAL | Status: DC

## 2016-03-12 NOTE — Progress Notes (Signed)
Pre visit review using our clinic review tool, if applicable. No additional management support is needed unless otherwise documented below in the visit note. 

## 2016-03-12 NOTE — Progress Notes (Signed)
   Subjective:    Patient ID: [name], male    DOB: 03-May-1951, 65 y.o.   MRN: [id number]  HPI Here to discuss several issues. First he needs refills on Lorazepam which is still helpful for him. 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 He still has stiffness and pain in the neck and he wants to proceed with PT. We had discussed this a few months ago but his work schedule prohibited this at that time. He takes Ibuprofen prn. He wants ot be checked for Hep C. He has a cpx coming up next month.    Review of Systems  Constitutional: Negative.   Respiratory: Negative.   Cardiovascular: Negative.   Musculoskeletal: Positive for neck pain and neck stiffness.  Psychiatric/Behavioral: Positive for sleep disturbance. Negative for behavioral problems, confusion, dysphoric mood and decreased concentration. The patient is nervous/anxious.        Objective:   Physical Exam  Constitutional: He is oriented to person, place, and time. He appears well-developed and well-nourished.  Cardiovascular: Normal rate, regular rhythm, normal heart sounds and intact distal pulses.   Pulmonary/Chest: Effort normal and breath sounds normal.  Musculoskeletal: Normal range of motion. He exhibits no edema or tenderness.  Neurological: He is alert and oriented to person, place, and time.  Psychiatric: He has a normal mood and affect. His behavior is normal. Thought content normal.          Assessment & Plan:  For the anxiety refil the Lorazepam and decrease the Cymbalta to 20 mg daily. For the neck pain we will refer him to PT. Refill Flexeril prn.  [name], MD

## 2016-03-12 NOTE — Telephone Encounter (Addendum)
-----   Message from [name], [place] sent at 03/11/2016 11:15 AM EDT ----- Regarding: NCV/EMG Bilateral numbness and tingling Please set up NCV/EMG Thanks Dr. Cannon Kettle. 03/12/2016-Faxed orders for NCV/EMG to [facility]. 03/13/2016-[facility] request clinicals to be sent.  Refaxed orders with clinicals and demographics.

## 2016-03-12 NOTE — Telephone Encounter (Signed)
Pharmacy need clarification on Rx cyclobenzaprine 10 mg

## 2016-03-12 NOTE — Telephone Encounter (Signed)
Per Dr. Sarajane Jews should be once at bedtime and I did resend script e-scribe with new directions.

## 2016-03-13 ENCOUNTER — Telehealth: Payer: Self-pay | Admitting: *Deleted

## 2016-03-13 NOTE — Telephone Encounter (Signed)
Recieved prior authorization request for cyclobenzaprine.  Called pharmacy, cash price for this medication is $16.94 for 90 tablets.  PA request will be discarded (unless patient insists) as I have never been successful in an approval for this medication.

## 2016-03-14 ENCOUNTER — Ambulatory Visit: Payer: Medicare Other

## 2016-04-08 ENCOUNTER — Ambulatory Visit: Payer: Medicare Other | Admitting: Sports Medicine

## 2016-04-15 ENCOUNTER — Other Ambulatory Visit (INDEPENDENT_AMBULATORY_CARE_PROVIDER_SITE_OTHER): Payer: Medicare Other

## 2016-04-15 DIAGNOSIS — Z0000 Encounter for general adult medical examination without abnormal findings: Secondary | ICD-10-CM

## 2016-04-15 DIAGNOSIS — Z209 Contact with and (suspected) exposure to unspecified communicable disease: Secondary | ICD-10-CM

## 2016-04-15 LAB — CBC WITH DIFFERENTIAL/PLATELET
BASOS PCT: 0.7 % (ref 0.0–3.0)
Basophils Absolute: 0 10*3/uL (ref 0.0–0.1)
EOS PCT: 2.2 % (ref 0.0–5.0)
Eosinophils Absolute: 0.1 10*3/uL (ref 0.0–0.7)
HCT: 41.3 % (ref 39.0–52.0)
Hemoglobin: 14.1 g/dL (ref 13.0–17.0)
LYMPHS ABS: 1.5 10*3/uL (ref 0.7–4.0)
Lymphocytes Relative: 24 % (ref 12.0–46.0)
MCHC: 34.2 g/dL (ref 30.0–36.0)
MCV: 88.7 fl (ref 78.0–100.0)
MONO ABS: 0.6 10*3/uL (ref 0.1–1.0)
MONOS PCT: 9 % (ref 3.0–12.0)
NEUTROS ABS: 4 10*3/uL (ref 1.4–7.7)
NEUTROS PCT: 64.1 % (ref 43.0–77.0)
Platelets: 212 10*3/uL (ref 150.0–400.0)
RBC: 4.66 Mil/uL (ref 4.22–5.81)
RDW: 13 % (ref 11.5–15.5)
WBC: 6.3 10*3/uL (ref 4.0–10.5)

## 2016-04-15 LAB — POC URINALSYSI DIPSTICK (AUTOMATED)
BILIRUBIN UA: NEGATIVE
Blood, UA: NEGATIVE
Glucose, UA: NEGATIVE
KETONES UA: NEGATIVE
LEUKOCYTES UA: NEGATIVE
NITRITE UA: NEGATIVE
PH UA: 6
PROTEIN UA: NEGATIVE
Spec Grav, UA: <=1.005
Urobilinogen, UA: 0.2

## 2016-04-15 LAB — HEPATIC FUNCTION PANEL
ALBUMIN: 4.3 g/dL (ref 3.5–5.2)
ALT: 14 U/L (ref 0–53)
AST: 16 U/L (ref 0–37)
Alkaline Phosphatase: 38 U/L — ABNORMAL LOW (ref 39–117)
BILIRUBIN TOTAL: 0.6 mg/dL (ref 0.2–1.2)
Bilirubin, Direct: 0.1 mg/dL (ref 0.0–0.3)
Total Protein: 6.8 g/dL (ref 6.0–8.3)

## 2016-04-15 LAB — LIPID PANEL
CHOLESTEROL: 188 mg/dL (ref 0–200)
HDL: 47.6 mg/dL (ref >39.00)
LDL CALC: 113 mg/dL — AB (ref 0–99)
NonHDL: 140.22
TRIGLYCERIDES: 138 mg/dL (ref 0.0–149.0)
Total CHOL/HDL Ratio: 4
VLDL: 27.6 mg/dL (ref 0.0–40.0)

## 2016-04-15 LAB — BASIC METABOLIC PANEL
BUN: 28 mg/dL — ABNORMAL HIGH (ref 6–23)
CHLORIDE: 100 meq/L (ref 96–112)
CO2: 28 meq/L (ref 19–32)
Calcium: 9.6 mg/dL (ref 8.4–10.5)
Creatinine, Ser: 1.25 mg/dL (ref 0.40–1.50)
GFR: 61.5 mL/min (ref >60.00)
GLUCOSE: 95 mg/dL (ref 70–99)
POTASSIUM: 4.4 meq/L (ref 3.5–5.1)
Sodium: 134 mEq/L — ABNORMAL LOW (ref 135–145)

## 2016-04-15 LAB — TSH: TSH: 1.48 u[IU]/mL (ref 0.35–4.50)

## 2016-04-15 LAB — PSA: PSA: 1.9 ng/mL (ref 0.10–4.00)

## 2016-04-16 ENCOUNTER — Ambulatory Visit: Payer: Medicare Other

## 2016-04-16 LAB — HEPATITIS C ANTIBODY: HCV Ab: NEGATIVE

## 2016-04-21 ENCOUNTER — Ambulatory Visit: Payer: Medicare Other | Attending: Family Medicine

## 2016-04-21 DIAGNOSIS — M542 Cervicalgia: Secondary | ICD-10-CM

## 2016-04-21 DIAGNOSIS — R293 Abnormal posture: Secondary | ICD-10-CM | POA: Diagnosis present

## 2016-04-21 NOTE — Therapy (Signed)
[place] MAIN [facility] SERVICES [address] [place], [place], [address] Phone: [phone]   Fax:  [phone]  Physical Therapy Evaluation  Patient Details  Name: [name] MRN: [id number] Date of Birth: September 08, 1951 Referring Provider: Sarajane Jews  Encounter Date: 04/21/2016      PT End of Session - 04/21/16 1312    Visit Number 1   Number of Visits 9   Date for PT Re-Evaluation 05/19/16   PT Start Time 0845   PT Stop Time 0925   PT Time Calculation (min) 40 min   Activity Tolerance No increased pain   Behavior During Therapy [facility] for tasks assessed/performed      Past Medical History:  Diagnosis Date  . Anxiety   . ED (erectile dysfunction)   . GERD (gastroesophageal reflux disease)   . Hyperlipidemia   . Hypertension   . Kidney stone 07-29-11   passed   . Vitreous floater    left eye, sees Dr. Dawna Part at [facility]     Past Surgical History:  Procedure Laterality Date  . BASAL CELL CARCINOMA EXCISION     removed from scalp  . COLONOSCOPY  07-09-07   per Dr. Olevia Perches, clear, repeat in 10 yrs   . HERNIA REPAIR      There were no vitals filed for this visit.       Subjective Assessment - 04/21/16 0855    Subjective pt reports mild neck pain ongoing for 5+ years, but it was never severe. 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 It is aggrevated by reading. he denies any numbness/tingling or pain that goes past the shoulder. he reports his pain is located to the R side of his neck. He does report he has to sit in a very small chair and lean over far when working with the young children. pt reports heat and moving around make it feel better. He also reports the pain which he describes as achy, worsens as the day goes on.    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 1 hr   How long can you stand comfortably?  unlimited   Diagnostic tests Xray   Patient Stated Goals reduce pain, reduce chance of reoccurance    Currently in Pain? Yes   Pain Score 1    Pain Location Neck   Pain Orientation Right   Pain Descriptors / Indicators Aching   Pain Type Chronic pain   Pain Onset More than a month ago   Pain Frequency Several days a week            [facility] PT Assessment - 04/21/16 1307      Assessment   Medical Diagnosis Neck pain   Referring Provider Sarajane Jews   Onset Date/Surgical Date 04/22/11   Hand Dominance Right   Prior Therapy no     Precautions   Precautions None     Restrictions   Weight Bearing Restrictions No     Balance Screen   Has the patient fallen in the past 6 months No   Has the patient had a decrease in activity level because of a fear of falling?  No   Is the patient reluctant to leave their home because of a fear of falling?  No     Home Social worker Private residence   Living Arrangements Alone  Type of [place] to enter   Entrance Stairs-Number of Steps 5   Entrance Stairs-Rails Right   Home Layout Two level   Alternate Level Stairs-Number of Steps 15   Home Equipment None     Prior Function   Level of Independence Independent;Independent with community mobility without device  pt reports no neck pain before 2012   Vocation Part time employment   Vocation Requirements --  prolonged sitting, reaching    Leisure walk     Cognition   Overall Cognitive Status Within Functional Limits for tasks assessed         POSTURE/OBSERVATION: Poor sitting posture, rounded shoulders, increased kyphosis, forward head.   PROM/AROM: Cervical flexion: 35 deg Cervical extension: 25 deg R sidebend: 25 deg L sidebend: 20 deg R rotation: 65 deg L rotation: 52 deg  T spine: Hypomobile throughout, but non painful  C spine: C2-7, mildly hypomobile, but non painful Increased tone and painful trigger point palpated in R upper  trap, levator scapula   STRENGTH:  Graded on a 0-5 scale Muscle Group Left Right  Shoulder flex 5 5  Shoulder Abd 5 5  Shoulder Ext 4 4  Shoulder IR/ER 4 4  Elbow 5 5  Wrist/hand  5 5  rhomboinds 4- 4-  Middle trap 4- 4-  Lower trap 3 3                           Myotomes: WNL bilaterally Dermatomes:  WNL bilaterally  Reflexes: Not tested    SPECIAL TESTS: spurlings (-) Distraction (-)  FUNCTIONAL MOBILITY:  No limitations    Therex: Chin tucks: x 10 R upper trap stretch 30s x 2 Doorway stretch 30s x 10 Repeated T spine ext over chair x 10 Pt requires min verbal and tactile cues for proper exercise performance                   PT Education - 04/21/16 1311    Education provided Yes   Education Details plan of care, exam findings, HEP   Person(s) Educated Patient   Methods Explanation;Handout   Comprehension Verbalized understanding;Verbal cues required             PT Long Term Goals - 04/21/16 1318      PT LONG TERM GOAL #1   Title pt will improve cervical rotation by 10 deg with pain <3/10 each way to aid with driving    Time 4   Period Weeks   Status New     PT LONG TERM GOAL #2   Title pt will demonstrate proper sitting /standing posture, needing < 3 cues to correct during PT session    Time 4   Period Weeks   Status New     PT LONG TERM GOAL #3   Title pt will be inependent and compliant with HEP   Time 8   Period Weeks   Status New     PT LONG TERM GOAL #4   Title pt will be able to sit and work x 4 hours without increased neck pain    Time 8   Period Weeks   Status New               Plan - 04/21/16 1312    Clinical Impression Statement pt presents with chronic R sided neck pain which seems to be predominately muscular in nature. He has limited Cervical  and thoracic ROM and joint mobility, impaired periscapular strength, impaired flexibility of the neck and pectoral musculature, and impaired posture. pt  needs PT to address this impairments to reduce his pain and be able to better tolerate his work activities.    Rehab Potential Good   Clinical Impairments Affecting Rehab Potential lives alone, job requirements need to sit on low surface prlonged periods    PT Frequency 1x / week   PT Duration 8 weeks   PT Treatment/Interventions ADLs/Self Care Home Management;Cryotherapy;Electrical Stimulation;Traction;Moist Heat;Therapeutic exercise;Patient/family education;Manual techniques;Dry needling   PT Next Visit Plan trigger point release to R upper trap, review HEP if needed   Consulted and Agree with Plan of Care Patient      Patient will benefit from skilled therapeutic intervention in order to improve the following deficits and impairments:  Postural dysfunction, Increased muscle spasms, Decreased activity tolerance, Decreased range of motion, Decreased strength, Hypomobility, Impaired flexibility  Visit Diagnosis: Cervicalgia - Plan: PT plan of care cert/re-cert  Abnormal posture - Plan: PT plan of care cert/re-cert     Problem List Patient Active Problem List   Diagnosis Date Noted  . Bilateral leg edema 06/05/2015  . Chronic neck pain 06/05/2015  . Plantar fasciitis 02/01/2014  . Visual floaters 04/04/2013  . Subjective vision disturbance, left 04/04/2013  . 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 MELANOMA, HX OF 06/01/2009  . NECK PAIN 09/11/2008  . CELLULITIS AND ABSCESS OF LEG EXCEPT FOOT 04/26/2008  . Acute sinusitis 01/06/2008  . ERECTILE DYSFUNCTION 10/12/2007  . HYPERLIPIDEMIA 04/12/2007  . Anxiety state 04/12/2007  . Essential hypertension 04/12/2007  . GERD 04/12/2007   Gorden Harms. Rida Loudin, PT, DPT [phone]  Itzamara Casas 04/21/2016, 1:24 PM  [place] MAIN [facility] SERVICES [address] [place], [place], [address] Phone: [phone]   Fax:  [phone]  Name: Jayk Mccommon MRN: [id number] Date of Birth:  [birth year]

## 2016-04-21 NOTE — Patient Instructions (Signed)
[email or web address] Chin tucks x 10 3x/day R upper trap stretch 30s x 2 3x/day Doorway stretch 30s x 2 Repeated T spine ext over chair x 10 3x/day

## 2016-04-22 ENCOUNTER — Encounter: Payer: Medicare Other | Admitting: Family Medicine

## 2016-04-23 ENCOUNTER — Ambulatory Visit (INDEPENDENT_AMBULATORY_CARE_PROVIDER_SITE_OTHER): Payer: Medicare Other | Admitting: Family Medicine

## 2016-04-23 ENCOUNTER — Ambulatory Visit: Payer: Medicare Other | Attending: Family Medicine

## 2016-04-23 ENCOUNTER — Encounter: Payer: Self-pay | Admitting: Family Medicine

## 2016-04-23 ENCOUNTER — Ambulatory Visit (INDEPENDENT_AMBULATORY_CARE_PROVIDER_SITE_OTHER)
Admission: RE | Admit: 2016-04-23 | Discharge: 2016-04-23 | Disposition: A | Discharge status: Home / Self Care | Payer: Medicare Other | Source: Ambulatory Visit | Attending: Family Medicine | Admitting: Family Medicine

## 2016-04-23 VITALS — BP 127/82 | HR 77 | Temp 98.4°F | Ht 73.0 in | Wt 190.0 lb

## 2016-04-23 DIAGNOSIS — Z85820 Personal history of malignant melanoma of skin: Secondary | ICD-10-CM

## 2016-04-23 DIAGNOSIS — R293 Abnormal posture: Secondary | ICD-10-CM

## 2016-04-23 DIAGNOSIS — Z23 Encounter for immunization: Secondary | ICD-10-CM | POA: Diagnosis not present

## 2016-04-23 DIAGNOSIS — M542 Cervicalgia: Secondary | ICD-10-CM

## 2016-04-23 DIAGNOSIS — Z0000 Encounter for general adult medical examination without abnormal findings: Secondary | ICD-10-CM | POA: Diagnosis not present

## 2016-04-23 NOTE — Therapy (Signed)
[place] MAIN [facility] SERVICES [address] [place], [place], [address] Phone: [phone]   Fax:  [phone]  Physical Therapy Treatment  Patient Details  Name: [name] MRN: [id number] Date of Birth: 04-19-1951 Referring Provider: Sarajane Jews  Encounter Date: 04/23/2016      PT End of Session - 04/23/16 1713    Visit Number 2   Number of Visits 9   Date for PT Re-Evaluation 05/19/16   PT Start Time 0845   PT Stop Time 0930   PT Time Calculation (min) 45 min   Activity Tolerance No increased pain   Behavior During Therapy [facility] for tasks assessed/performed      Past Medical History:  Diagnosis Date  . Anxiety   . ED (erectile dysfunction)   . GERD (gastroesophageal reflux disease)   . Hyperlipidemia   . Hypertension   . Kidney stone 07-29-11   passed   . Vitreous floater    left eye, sees Dr. Dawna [facility] at [facility]     Past Surgical History:  Procedure Laterality Date  . BASAL CELL CARCINOMA EXCISION     removed from scalp  . COLONOSCOPY  07-09-07   per Dr. Olevia Perches, clear, repeat in 10 yrs   . HERNIA REPAIR      There were no vitals filed for this visit.      Subjective Assessment - 04/23/16 1712    Subjective pt reports he has some questions about HEP   Pertinent History C4-6 DDD   Limitations Sitting;Reading   How long can you sit comfortably? 1 hr   How long can you stand comfortably? unlimited   Diagnostic tests Xray   Patient Stated Goals reduce pain, reduce chance of reoccurance    Currently in Pain? Yes   Pain Score 3    Pain Location Neck   Pain Orientation Right   Pain Descriptors / Indicators Aching   Pain Onset More than a month ago       MHP to R shoulder/upper trap x 5 min no charge  Manual therapy:  Extensive Soft tissue mobilization, Ischemic trigger point release to R upper trap, levator scap, SCM and scalenes x 28 min multiple tripper points found especially in proximal upper  trap  ThereX: HEP reinstruction of CS retraction 2 x 10, REIS 2 x 10 and how to properly place lumbar and cervical support roll  Pt requires min-mod verbal and tactile cues for proper exercise performance                           PT Education - 04/23/16 1713    Education provided Yes   Education Details HEP review and modification of T spine ext    Person(s) Educated Patient   Methods Explanation   Comprehension Verbalized understanding             PT Long Term Goals - 04/21/16 1318      PT LONG TERM GOAL #1   Title pt will improve cervical rotation by 10 deg with pain <3/10 each way to aid with driving    Time 4   Period Weeks   Status New     PT LONG TERM GOAL #2   Title pt will demonstrate proper sitting /standing posture, needing < 3 cues to correct during PT session    Time 4   Period Weeks   Status New     PT LONG TERM GOAL #3  Title pt will be inependent and compliant with HEP   Time 8   Period Weeks   Status New     PT LONG TERM GOAL #4   Title pt will be able to sit and work x 4 hours without increased neck pain    Time 8   Period Weeks   Status New               Plan - 04/23/16 1713    Clinical Impression Statement pt responded well to manual therapy today and feld looser and with less pain. he also demonstrated improved ROM especially with CS flexion and L rotation. pt had quite a few questions (some repeated a few times) about his HEP which was reviewed/reinstructed today.    Rehab Potential Good   Clinical Impairments Affecting Rehab Potential lives alone, job requirements need to sit on low surface prlonged periods    PT Frequency 1x / week   PT Duration 8 weeks   PT Treatment/Interventions ADLs/Self Care Home Management;Cryotherapy;Electrical Stimulation;Traction;Moist Heat;Therapeutic exercise;Patient/family education;Manual techniques;Dry needling   PT Next Visit Plan trigger point release to R upper trap, review  HEP if needed   Consulted and Agree with Plan of Care Patient      Patient will benefit from skilled therapeutic intervention in order to improve the following deficits and impairments:  Postural dysfunction, Increased muscle spasms, Decreased activity tolerance, Decreased range of motion, Decreased strength, Hypomobility, Impaired flexibility  Visit Diagnosis: Cervicalgia  Abnormal posture     Problem List Patient Active Problem List   Diagnosis Date Noted  . Bilateral leg edema 06/05/2015  . Chronic neck pain 06/05/2015  . Plantar fasciitis 02/01/2014  . Visual floaters 04/04/2013  . Subjective vision disturbance, left 04/04/2013  . 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 MELANOMA, HX OF 06/01/2009  . NECK PAIN 09/11/2008  . CELLULITIS AND ABSCESS OF LEG EXCEPT FOOT 04/26/2008  . Acute sinusitis 01/06/2008  . ERECTILE DYSFUNCTION 10/12/2007  . HYPERLIPIDEMIA 04/12/2007  . Anxiety state 04/12/2007  . Essential hypertension 04/12/2007  . GERD 04/12/2007   Gorden Harms. Adilyn Humes, PT, DPT [phone]  Nan Maya 04/23/2016, 5:16 PM  [place] MAIN [facility] SERVICES [address] [place], [place], [address] Phone: [phone]   Fax:  [phone]  Name: [name] MRN: [id number] Date of Birth: 1951/01/21

## 2016-04-23 NOTE — Progress Notes (Signed)
Pre visit review using our clinic review tool, if applicable. No additional management support is needed unless otherwise documented below in the visit note. 

## 2016-04-23 NOTE — Progress Notes (Signed)
   Subjective:    Patient ID: [name], male    DOB: 1951/02/15, 65 y.o.   MRN: [id number]  HPI 65 yr old male for a well exam. He feels well in general. He has had 2 sessions of PT for his neck pain and he is already seeing improvement.    Review of Systems  Constitutional: Negative.   HENT: Negative.   Eyes: Negative.   Respiratory: Negative.   Cardiovascular: Negative.   Gastrointestinal: Negative.   Genitourinary: Negative.   Musculoskeletal: Negative.   Skin: Negative.   Neurological: Negative.   Psychiatric/Behavioral: Negative.        Objective:   Physical Exam  Constitutional: He is oriented to person, place, and time. He appears well-developed and well-nourished. No distress.  HENT:  Head: Normocephalic and atraumatic.  Right Ear: External ear normal.  Left Ear: External ear normal.  Nose: Nose normal.  Mouth/Throat: Oropharynx is clear and moist. No oropharyngeal exudate.  Eyes: Conjunctivae and EOM are normal. Pupils are equal, round, and reactive to light. Right eye exhibits no discharge. Left eye exhibits no discharge. No scleral icterus.  Neck: Neck supple. No JVD present. No tracheal deviation present. No thyromegaly present.  Cardiovascular: Normal rate, regular rhythm, normal heart sounds and intact distal pulses.  Exam reveals no gallop and no friction rub.   No murmur heard. EKG normal   Pulmonary/Chest: Effort normal and breath sounds normal. No respiratory distress. He has no wheezes. He has no rales. He exhibits no tenderness.  Abdominal: Soft. Bowel sounds are normal. He exhibits no distension and no mass. There is no tenderness. There is no rebound and no guarding.  Genitourinary: Rectum normal, prostate normal and penis normal. Rectal exam shows guaiac negative stool. No penile tenderness.  Musculoskeletal: Normal range of motion. He exhibits no edema or tenderness.  Lymphadenopathy:    He has no cervical adenopathy.  Neurological: He is  alert and oriented to person, place, and time. He has normal reflexes. No cranial nerve deficit. He exhibits normal muscle tone. Coordination normal.  Skin: Skin is warm and dry. No rash noted. He is not diaphoretic. No erythema. No pallor.  Psychiatric: He has a normal mood and affect. His behavior is normal. Judgment and thought content normal.          Assessment & Plan:  Well exam. We discussed diet and exercise. Get a CXR.  [name], MD

## 2016-04-23 NOTE — Addendum Note (Signed)
Addended by: Aggie Hacker A on: 04/23/2016 02:38 PM   Modules accepted: Orders

## 2016-04-25 ENCOUNTER — Encounter: Payer: Self-pay | Admitting: Sports Medicine

## 2016-04-25 ENCOUNTER — Ambulatory Visit (INDEPENDENT_AMBULATORY_CARE_PROVIDER_SITE_OTHER): Payer: Medicare Other | Admitting: Sports Medicine

## 2016-04-25 DIAGNOSIS — M792 Neuralgia and neuritis, unspecified: Secondary | ICD-10-CM | POA: Diagnosis not present

## 2016-04-25 DIAGNOSIS — M79671 Pain in right foot: Secondary | ICD-10-CM | POA: Diagnosis not present

## 2016-04-25 DIAGNOSIS — M722 Plantar fascial fibromatosis: Secondary | ICD-10-CM

## 2016-04-26 NOTE — Progress Notes (Signed)
Patient ID: [name], male   DOB: 12-28-1950, 65 y.o.   MRN: [id number] Subjective: [name] is a 65 y.o. male patient returns to office for follow up of arch pain on right. Patient states that his pain is resolved after completing steroids. States that he still gets numbness to right foot that is minor and sometimes aggravated by positions; awaiting nerve tests. Denies any other pedal complaints.   Patient Active Problem List   Diagnosis Date Noted  . Bilateral leg edema 06/05/2015  . Chronic neck pain 06/05/2015  . Plantar fasciitis 02/01/2014  . Visual floaters 04/04/2013  . Subjective vision disturbance, left 04/04/2013  . 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 MELANOMA, HX OF 06/01/2009  . NECK PAIN 09/11/2008  . CELLULITIS AND ABSCESS OF LEG EXCEPT FOOT 04/26/2008  . Acute sinusitis 01/06/2008  . ERECTILE DYSFUNCTION 10/12/2007  . HYPERLIPIDEMIA 04/12/2007  . Anxiety state 04/12/2007  . Essential hypertension 04/12/2007  . GERD 04/12/2007    Current Outpatient Prescriptions on File Prior to Visit  Medication Sig Dispense Refill  . amLODipine (NORVASC) 5 MG tablet TAKE 1 TABLET (5 MG TOTAL) BY MOUTH DAILY. 30 tablet 5  . cyclobenzaprine (FLEXERIL) 10 MG tablet Take 1 tablet (10 mg total) by mouth at bedtime. 90 tablet 3  . DULoxetine (CYMBALTA) 20 MG capsule Take 1 capsule (20 mg total) by mouth daily. 90 capsule 3  . finasteride ([name]) 5 MG tablet Take 1 tablet (5 mg total) by mouth daily. 1 tablet 0  . ibuprofen (ADVIL,MOTRIN) 800 MG tablet TAKE 1 TABLET (800 MG TOTAL) BY MOUTH EVERY 6 (SIX) HOURS AS NEEDED FOR MODERATE PAIN. 60 tablet 5  . lisinopril (PRINIVIL,ZESTRIL) 20 MG tablet TAKE 1 TABLET (20 MG TOTAL) BY MOUTH DAILY. 30 tablet 5  . LORazepam (ATIVAN) 1 MG tablet Take 1 tablet (1 mg total) by mouth every 6 (six) hours as needed. 120 tablet 5  . mirabegron ER (MYRBETRIQ) 25 MG TB24 tablet Take 25 mg by mouth daily.    Marland Kitchen omeprazole (PRILOSEC) 20 MG  capsule Take 1 capsule (20 mg total) by mouth 2 (two) times daily before a meal. 60 capsule 3  . [DISCONTINUED] tadalafil (CIALIS) 20 MG tablet Take 20 mg by mouth daily as needed.       No current facility-administered medications on file prior to visit.     Allergies  Allergen Reactions  . Codeine     Objective: Physical Exam General: The patient is alert and oriented x3 in no acute distress.  Dermatology: Skin is warm, dry and supple bilateral lower extremities. Nails 1-10 are normal. There is no erythema, edema, no eccymosis, no open lesions present. Integument is otherwise unremarkable.  Vascular: Dorsalis Pedis pulse and Posterior Tibial pulse are 1/4 bilateral. Capillary fill time is immediate to all digits.  Neurological: Grossly intact to light touch with an achilles reflex of +2/5 and a  negative Tinel's sign bilateral.Subjective numbness and tingling, right greater than left foot; worse with positional changes with history of low back and hip pain, degenerative in nature.  Musculoskeletal: No tenderness to palpation at the medial calcaneal tubercale, no tenderness through the insertion of the plantar fascia at the arch on right foot. No pain with compression of calcaneus bilateral. No pain with tuning fork to calcaneus bilateral. No pain with calf compression bilateral. There is decreased Ankle joint range of motion bilateral. All other joints range of motion within normal limits bilateral. Strength 5/5 in all groups bilateral.  Assessment and Plan: Problem List Items Addressed This Visit    None    Visit Diagnoses    Neuritis, right     -  Primary   Plantar fasciitis of right foot       Right foot pain          -Complete examination performed.  -Discussed with patient in detail continued care for plantar fasciitis, Distal arch and neuritis  -Recommended good supportive shoes and advised use of OTC insert.  -May wean from fascial brace -Continue daily stretching  exercises. -Awaiting neurology consultation and nerve studies for neuritis symptoms to rule out large fiber involvement due to history of hip and back arthritis; will call patient to discuss results once he has been evaluated by neurology  -Patient to return to office as needed for follow up or sooner if problems or questions arise.  [name], DPM

## 2016-04-28 ENCOUNTER — Ambulatory Visit: Payer: Medicare Other

## 2016-04-28 DIAGNOSIS — M542 Cervicalgia: Secondary | ICD-10-CM

## 2016-04-28 DIAGNOSIS — R293 Abnormal posture: Secondary | ICD-10-CM

## 2016-04-29 NOTE — Patient Instructions (Signed)
[email or web address] Low row. Red band 3x10/day

## 2016-04-29 NOTE — Therapy (Signed)
[place] MAIN [facility] SERVICES [address] [place], [place], [address] Phone: [phone]   Fax:  [phone]  Physical Therapy Treatment  Patient Details  Name: [name] MRN: [id number] Date of Birth: 1950/11/04 Referring Provider: Sarajane Jews  Encounter Date: 04/28/2016      PT End of Session - 04/29/16 1136    Visit Number 3   Number of Visits 9   Date for PT Re-Evaluation 05/19/16   PT Start Time 0845   PT Stop Time 0933   PT Time Calculation (min) 48 min   Activity Tolerance No increased pain   Behavior During Therapy [facility] for tasks assessed/performed      Past Medical History:  Diagnosis Date  . Anxiety   . ED (erectile dysfunction)   . GERD (gastroesophageal reflux disease)   . Hyperlipidemia   . Hypertension   . Kidney stone 07-29-11   passed   . Vitreous floater    left eye, sees Dr. Dawna Part at [facility]     Past Surgical History:  Procedure Laterality Date  . BASAL CELL CARCINOMA EXCISION     removed from scalp  . COLONOSCOPY  07-09-07   per Dr. Olevia Perches, clear, repeat in 10 yrs   . HERNIA REPAIR      There were no vitals filed for this visit.      Subjective Assessment - 04/29/16 1134    Subjective pt reports his neck pain is much less.  he is compliant with HEP   Pertinent History C4-6 DDD   Limitations Sitting;Reading   How long can you sit comfortably? 1 hr   How long can you stand comfortably? unlimited   Diagnostic tests Xray   Patient Stated Goals reduce pain, reduce chance of reoccurance    Currently in Pain? Yes   Pain Score 1    Pain Location Neck   Pain Orientation Right   Pain Descriptors / Indicators Aching   Pain Onset More than a month ago       manual therapy Ischemic trigger point release to R upper trap Patient received dry needling therapy education and acknowledged understanding of risks and benefits of dry needling therapy prior to receiving treatment. Patient voiced  understanding of treatment options and elected to proceed with dry needling therapy.   Trigger point dry needling to R upper trap. Twitch elicited, pt reported deep ache Pt had improved ROM and no pain following treatment CPA glides to T spine with focus on T 4-12 grade III 3 bouts 20s each level  Therex: Lat pull down 2 plates 2 x 10 Mid row in fitness center 2 plates 2 x 10 Low row red band 3 x 10 Pt requires min-mod verbal and tactile cues for proper exercise performance                            PT Education - 04/29/16 1135    Education provided No   Education Details posture ed , scap retraction, dry needling ed   Person(s) Educated Patient   Methods Explanation   Comprehension Verbalized understanding;Returned demonstration;Verbal cues required;Tactile cues required             PT Long Term Goals - 04/21/16 1318      PT LONG TERM GOAL #1   Title pt will improve cervical rotation by 10 deg with pain <3/10 each way to aid with driving    Time 4  Period Weeks   Status New     PT LONG TERM GOAL #2   Title pt will demonstrate proper sitting /standing posture, needing < 3 cues to correct during PT session    Time 4   Period Weeks   Status New     PT LONG TERM GOAL #3   Title pt will be inependent and compliant with HEP   Time 8   Period Weeks   Status New     PT LONG TERM GOAL #4   Title pt will be able to sit and work x 4 hours without increased neck pain    Time 8   Period Weeks   Status New               Plan - 04/29/16 1136    Clinical Impression Statement pt continues to respond very well to therapy. he reported no pain following manual therapy and was no aggrevated with therex today. began periscapular strengthening today, which went well, although he has difficulty consistantly performing scap retraction as directed and will likely need reinstruction.    Rehab Potential Good   Clinical Impairments Affecting Rehab Potential  lives alone, job requirements need to sit on low surface prlonged periods    PT Frequency 1x / week   PT Duration 8 weeks   PT Treatment/Interventions ADLs/Self Care Home Management;Cryotherapy;Electrical Stimulation;Traction;Moist Heat;Therapeutic exercise;Patient/family education;Manual techniques;Dry needling   PT Next Visit Plan strengthen as tolerated. reassess pain   Consulted and Agree with Plan of Care Patient      Patient will benefit from skilled therapeutic intervention in order to improve the following deficits and impairments:  Postural dysfunction, Increased muscle spasms, Decreased activity tolerance, Decreased range of motion, Decreased strength, Hypomobility, Impaired flexibility  Visit Diagnosis: Cervicalgia  Abnormal posture     Problem List Patient Active Problem List   Diagnosis Date Noted  . Bilateral leg edema 06/05/2015  . Chronic neck pain 06/05/2015  . Plantar fasciitis 02/01/2014  . Visual floaters 04/04/2013  . Subjective vision disturbance, left 04/04/2013  . 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 MELANOMA, HX OF 06/01/2009  . NECK PAIN 09/11/2008  . CELLULITIS AND ABSCESS OF LEG EXCEPT FOOT 04/26/2008  . Acute sinusitis 01/06/2008  . ERECTILE DYSFUNCTION 10/12/2007  . HYPERLIPIDEMIA 04/12/2007  . Anxiety state 04/12/2007  . Essential hypertension 04/12/2007  . GERD 04/12/2007   [name], PT, DPT [phone]  [name] 04/29/2016, 11:39 AM  [place] MAIN [facility] SERVICES [address] [place], [place], [address] Phone: [phone]   Fax:  [phone]  Name: [name] MRN: [id number] Date of Birth: 30-Jan-1951

## 2016-05-05 ENCOUNTER — Ambulatory Visit: Payer: Medicare Other

## 2016-05-05 DIAGNOSIS — M542 Cervicalgia: Secondary | ICD-10-CM | POA: Diagnosis not present

## 2016-05-05 DIAGNOSIS — R293 Abnormal posture: Secondary | ICD-10-CM

## 2016-05-05 NOTE — Therapy (Signed)
[place] MAIN [facility] SERVICES [address] [place], [place], [address] Phone: [phone]   Fax:  [phone]  Physical Therapy Treatment  Patient Details  Name: Graves Nipp MRN: [id number] Date of Birth: [birth year] Referring Provider: Sarajane Jews  Encounter Date: 05/05/2016      PT End of Session - 05/05/16 1035    Visit Number 4   Number of Visits 9   Date for PT Re-Evaluation 05/19/16   PT Start Time 0850   PT Stop Time 0950   PT Time Calculation (min) 60 min   Activity Tolerance No increased pain   Behavior During Therapy [facility] for tasks assessed/performed      Past Medical History:  Diagnosis Date  . Anxiety   . ED (erectile dysfunction)   . GERD (gastroesophageal reflux disease)   . Hyperlipidemia   . Hypertension   . Kidney stone 07-29-11   passed   . Vitreous floater    left eye, sees [facility]. Dawna [facility] at [facility]     Past Surgical History:  Procedure Laterality Date  . BASAL CELL CARCINOMA EXCISION     removed from scalp  . COLONOSCOPY  07-09-07   per Dr. Olevia Perches, clear, repeat in 10 yrs   . HERNIA REPAIR      There were no vitals filed for this visit.      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 he has been compliant with HEP   Pertinent History C4-6 DDD   Limitations Sitting;Reading   How long can you sit comfortably? 1 hr   How long can you stand comfortably? unlimited   Diagnostic tests Xray   Patient Stated Goals reduce pain, reduce chance of reoccurance    Currently in Pain? No/denies   Pain Onset More than a month ago      Therex: Low row red band 3 x 10 Mid row in fitness center 3 plates 2 x 10 Lat pull down in fitness center 3 plates 2 x 10 Wall slide with lift off 2 x 10 Supine chin tuck with lift off 5s x 10 Pt needs mod cues for most exercises,  verbal and tactile especially for scap retraction  Manual therapy: CPA mobs to T spine T 2-12 grade III-IV 2 bouts 30s each level Ischemic trigger point release to r upper trap Patient received dry needling therapy education and acknowledged understanding of risks and benefits of dry needling therapy prior to receiving treatment. Patient voiced understanding of treatment options and elected to proceed with dry needling therapy.   Trigger point dry needling to R upper trap Soft tissue mobilization to R upper trap Manual R upper trap stretch with MET Pt demosntrates improved neck ROM and painless grossly                            PT Education - 05/05/16 1034    Education provided Yes   Education Details cues to correct exercises. dry needling ed.    Person(s) Educated Patient   Comprehension Verbalized understanding;Verbal cues required;Tactile cues required             PT Long Term Goals - 04/21/16 1318      PT LONG TERM GOAL #1   Title pt will improve cervical rotation by 10 deg with pain <3/10  each way to aid with driving    Time 4   Period Weeks   Status New     PT LONG TERM GOAL #2   Title pt will demonstrate proper sitting /standing posture, needing < 3 cues to correct during PT session    Time 4   Period Weeks   Status New     PT LONG TERM GOAL #3   Title pt will be inependent and compliant with HEP   Time 8   Period Weeks   Status New     PT LONG TERM GOAL #4   Title pt will be able to sit and work x 4 hours without increased neck pain    Time 8   Period Weeks   Status New               Plan - 05/05/16 1040    Clinical Impression Statement pt continues to progress well towards goals wtih reduced pain, improved ROM and HEP compliance. progressed strengthening today for deep neck flexors and lower trap and continued periscapular strengthening. pt did need extensive cues for lower trap and deep neck flexor strengthening and thus  was not added to HEP yet. pt needs further PT servives in order to further reduce pain and maximize function.    Rehab Potential Good   Clinical Impairments Affecting Rehab Potential lives alone, job requirements need to sit on low surface prlonged periods    PT Frequency 1x / week   PT Duration 8 weeks   PT Treatment/Interventions ADLs/Self Care Home Management;Cryotherapy;Electrical Stimulation;Traction;Moist Heat;Therapeutic exercise;Patient/family education;Manual techniques;Dry needling   PT Next Visit Plan strengthen as tolerated. reassess pain   Consulted and Agree with Plan of Care Patient      Patient will benefit from skilled therapeutic intervention in order to improve the following deficits and impairments:  Postural dysfunction, Increased muscle spasms, Decreased activity tolerance, Decreased range of motion, Decreased strength, Hypomobility, Impaired flexibility  Visit Diagnosis: Cervicalgia  Abnormal posture     Problem List Patient Active Problem List   Diagnosis Date Noted  . Bilateral leg edema 06/05/2015  . Chronic neck pain 06/05/2015  . Plantar fasciitis 02/01/2014  . Visual floaters 04/04/2013  . Subjective vision disturbance, left 04/04/2013  . 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 MELANOMA, HX OF 06/01/2009  . NECK PAIN 09/11/2008  . CELLULITIS AND ABSCESS OF LEG EXCEPT FOOT 04/26/2008  . Acute sinusitis 01/06/2008  . ERECTILE DYSFUNCTION 10/12/2007  . HYPERLIPIDEMIA 04/12/2007  . Anxiety state 04/12/2007  . Essential hypertension 04/12/2007  . GERD 04/12/2007   Gorden Harms. Jeanenne Licea, PT, DPT [phone]  Bayne Fosnaugh 05/05/2016, 10:43 AM  [place] MAIN [facility] SERVICES [address] [place], [place], [address] Phone: [phone]   Fax:  [phone]  Name: Norwin Aleman MRN: [id number] Date of Birth: [birth year]

## 2016-05-08 ENCOUNTER — Other Ambulatory Visit: Payer: Self-pay | Admitting: Family Medicine

## 2016-05-12 ENCOUNTER — Ambulatory Visit: Payer: Medicare Other

## 2016-05-13 ENCOUNTER — Ambulatory Visit: Payer: Medicare Other

## 2016-05-13 VITALS — BP 138/87 | HR 85

## 2016-05-13 DIAGNOSIS — M542 Cervicalgia: Secondary | ICD-10-CM | POA: Diagnosis not present

## 2016-05-13 DIAGNOSIS — R293 Abnormal posture: Secondary | ICD-10-CM

## 2016-05-13 NOTE — Therapy (Signed)
[place] MAIN [facility] SERVICES [address] [place], [place], [address] Phone: [phone]   Fax:  [phone]  Physical Therapy Treatment  Patient Details  Name: [name] MRN: [id number] Date of Birth: 1951/01/27 Referring Provider: Sarajane [name]  Encounter Date: 05/13/2016      PT End of Session - 05/13/16 0811    Visit Number 5   Number of Visits 9   Date for PT Re-Evaluation 05/19/16   PT Start Time 0808   PT Stop Time 0850   PT Time Calculation (min) 42 min   Equipment Utilized During Treatment Gait belt   Activity Tolerance No increased pain   Behavior During Therapy [name] for tasks assessed/performed      Past Medical History:  Diagnosis Date  . Anxiety   . ED (erectile dysfunction)   . GERD (gastroesophageal reflux disease)   . Hyperlipidemia   . Hypertension   . Kidney stone 07-29-11   passed   . Vitreous floater    left eye, sees [name] at [name]     Past Surgical History:  Procedure Laterality Date  . BASAL CELL CARCINOMA EXCISION     removed from scalp  . COLONOSCOPY  07-09-07   per [name], clear, repeat in 10 yrs   . HERNIA REPAIR      Vitals:   05/13/16 0812  BP: 138/87  Pulse: 85  SpO2: 99%        Subjective Assessment - 05/13/16 0810    Subjective Pt denies neck pain currently. 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 No specific questions or concerns currently.    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 1 hr   How long can you stand comfortably? unlimited   Diagnostic tests Xray   Patient Stated Goals reduce pain, reduce chance of reoccurance    Currently in Pain? No/denies                  TREATMENT   Manual therapy Supine manual cervical traction 30 seconds on, 15 seconds off x 4 bouts; Suboccipital release with positive reproduction of R sided neck pain  30 seconds x 3; R scalene and R upper trap stretch 30 seconds each; R first rib mobilizations grade II, 20 seconds/bout x 3 bouts; Grade III central PA mobilizations T2-T8 30 seconds/bout, 2 bouts/level; Foam roller extension x 5 bouts/level moving gradually from lower to upper thoracic spine; Attempted prayer stretch on mat table but pt has difficulty performing; Seated prayer stretch 30 seconds x 2 each (added to HEP); Extensive review with patient regarding posture, workplace modifications, and HEP; Pt requires moderate cues for form with foam roller extension and prayer stretches.              PT Education - 05/13/16 [phone]    Education provided Yes   Education Details HEP reinforced, issued prayer stretch   Person(s) Educated Patient   Methods Explanation   Comprehension Verbalized understanding             PT Long Term Goals - 04/21/16 1318      PT LONG TERM GOAL #1   Title pt will improve cervical rotation by 10 deg with pain <3/10 each way to aid with driving    Time 4   Period Weeks   Status New     PT LONG TERM GOAL #  2   Title pt will demonstrate proper sitting /standing posture, needing < 3 cues to correct during PT session    Time 4   Period Weeks   Status New     PT LONG TERM GOAL #3   Title pt will be inependent and compliant with HEP   Time 8   Period Weeks   Status New     PT LONG TERM GOAL #4   Title pt will be able to sit and work x 4 hours without increased neck pain    Time 8   Period Weeks   Status New               Plan - 05/13/16 [id number]    Clinical Impression Statement Pt continues to report improvement in R sided neck pain. He is somwhat anxious regarding returning to work due to general increase in neck pain with work. First rib mobilizations on R side are significantly more painful than the L side. Pt issued prayer stretch to add to HEP. Advised to continue additional HEP and follow-up as scheduled.    Rehab Potential Good    Clinical Impairments Affecting Rehab Potential lives alone, job requirements need to sit on low surface prlonged periods    PT Frequency 1x / week   PT Duration 8 weeks   PT Treatment/Interventions ADLs/Self Care Home Management;Cryotherapy;Electrical Stimulation;Traction;Moist Heat;Therapeutic exercise;Patient/family education;Manual techniques;Dry needling   PT Next Visit Plan Repeat outcome measures and recert/update goals, continue with thoracic mobilizations, scalene/upper trap stretches, first rib mobilizations, DNF strengthening   PT Home Exercise Plan Pec stretch, cervical retractions, straight arm rows, upper trap/scalene stretch, shoulder rolls, posture correction, prayer stretch   Consulted and Agree with Plan of Care Patient      Patient will benefit from skilled therapeutic intervention in order to improve the following deficits and impairments:  Postural dysfunction, Increased muscle spasms, Decreased activity tolerance, Decreased range of motion, Decreased strength, Hypomobility, Impaired flexibility  Visit Diagnosis: Cervicalgia  Abnormal posture     Problem List Patient Active Problem List   Diagnosis Date Noted  . Bilateral leg edema 06/05/2015  . Chronic neck pain 06/05/2015  . Plantar fasciitis 02/01/2014  . Visual floaters 04/04/2013  . Subjective vision disturbance, left 04/04/2013  . 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 MELANOMA, HX OF 06/01/2009  . NECK PAIN 09/11/2008  . CELLULITIS AND ABSCESS OF LEG EXCEPT FOOT 04/26/2008  . Acute sinusitis 01/06/2008  . ERECTILE DYSFUNCTION 10/12/2007  . HYPERLIPIDEMIA 04/12/2007  . Anxiety state 04/12/2007  . Essential hypertension 04/12/2007  . GERD 04/12/2007   [name] PT, DPT   [name] 05/13/2016, 9:17 AM  [place] MAIN [facility] SERVICES [address] [place], [place], [address] Phone: [phone]   Fax:  [phone]  Name: [name] MRN:  [id number] Date of Birth: 12/18/1950

## 2016-05-20 ENCOUNTER — Encounter: Payer: Medicare Other | Admitting: Physical Therapy

## 2016-05-22 ENCOUNTER — Ambulatory Visit: Payer: Medicare Other | Admitting: Physical Therapy

## 2016-05-22 ENCOUNTER — Encounter: Payer: Self-pay | Admitting: Physical Therapy

## 2016-05-22 DIAGNOSIS — M542 Cervicalgia: Secondary | ICD-10-CM | POA: Diagnosis not present

## 2016-05-22 DIAGNOSIS — R293 Abnormal posture: Secondary | ICD-10-CM

## 2016-05-22 NOTE — Therapy (Signed)
[place] MAIN [facility] SERVICES [address] [place], [place], [address] Phone: [phone]   Fax:  [phone]  Physical Therapy Treatment  Patient Details  Name: [name] MRN: [id number] Date of Birth: 1951/01/30 Referring Provider: Sarajane Jews  Encounter Date: 05/22/2016      PT End of Session - 05/22/16 1614    Visit Number 6   Number of Visits 15   Date for PT Re-Evaluation 07/03/16   Authorization Time Period 1/10   PT Start Time 1520   PT Stop Time 1606   PT Time Calculation (min) 46 min   Equipment Utilized During Treatment Gait belt   Activity Tolerance No increased pain   Behavior During Therapy WFL for tasks assessed/performed      Past Medical History:  Diagnosis Date  . Anxiety   . ED (erectile dysfunction)   . GERD (gastroesophageal reflux disease)   . Hyperlipidemia   . Hypertension   . Kidney stone 07-29-11   passed   . Vitreous floater    left eye, sees Dr. Dawna [facility] at [facility]     Past Surgical History:  Procedure Laterality Date  . BASAL CELL CARCINOMA EXCISION     removed from scalp  . COLONOSCOPY  07-09-07   per Dr. Olevia Perches, clear, repeat in 10 yrs   . HERNIA REPAIR      There were no vitals filed for this visit.      Subjective Assessment - 05/22/16 1523    Subjective Pt reports he felt well for a few days after his last session.  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  He was sore after this but he says not as sore as he would have been before coming to PT.  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  Proposed looking into a computer chair so that the chair and armrests will go up/down and so he can transport it easily to different rooms.  Will be working 2.5 days a week beginning next week.   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 1 hr   How long can you stand comfortably? unlimited   Diagnostic tests Xray   Patient Stated Goals reduce pain, reduce chance of reoccurance    Currently in Pain? Yes   Pain Score 3    Pain Location Neck   Pain Orientation Right  pt points directly over SCM and UT   Pain Descriptors / Indicators Aching   Pain Type Chronic pain       TREATMENT   Manual therapy  AROM:  Cervical flexion: 35 deg (eval), 57 (8/31) Cervical extension: 25 deg (eval), 31 deg with painful stretch Rt UT (8/31) R sidebend: 25 deg (eval), 32 deg +pain Rt UT (8/31) L sidebend: 20 deg (eval), 25 deg (8/31) R rotation: 65 deg (eval), 59 deg +pain Rt neck (8/31) L rotation: 52 deg (eval), 60 deg (8/31) Ischemic compression Rt SCM with + reproduction R sided neck pain Supine manual cervical traction 30 seconds on, 15 seconds off x 3 bouts;  Suboccipital release with positive reproduction of R sided neck pain 30 seconds x 3;  R scalene and R upper trap stretch 2x30 seconds each;  R first rib mobilizations grade II, 20 seconds/bout x 3 bouts;  Extensive review with patient  regarding workplace modifications and ergonomic setup          PT Education - 06/01/16 1530    Education provided Yes   Education Details continue HEP, discussed ergonomics and specifics of chair most suitable for pt's profession, log roll for into/OOB   Person(s) Educated Patient   Methods Explanation;Demonstration;Verbal cues   Comprehension Verbalized understanding;Returned demonstration;Verbal cues required             PT Long Term Goals - June 01, 2016 1623      PT LONG TERM GOAL #1   Title pt will improve cervical rotation by 10 deg with pain <3/10 each way to aid with driving    Time 6   Period Weeks   Status On-going     PT LONG TERM GOAL #2   Title pt will demonstrate proper sitting /standing posture, needing < 3 cues to correct during PT session    Time 6   Period Weeks   Status On-going     PT  LONG TERM GOAL #3   Title pt will be inependent and compliant with HEP   Time 6   Period Weeks   Status On-going     PT LONG TERM GOAL #4   Title pt will be able to sit and work x 4 hours without increased neck pain    Time 6   Period Weeks   Status On-going     PT LONG TERM GOAL #5   Title NDI will improve by 10% to indicate decrease in affect of pain on pt's daily activities   Baseline 12%   Time 6   Period Weeks   Status New               Plan - June 01, 2016 1615    Clinical Impression Statement Pt self reports improvement in symptoms, 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  Pt scored 12% on NDI indicating that neck pain does affect pt's daily life.  Main cervical AROM deficits remains with extension which elicits pain.  Pt is making progress towards goals but will benefit from skilled PT interventions to assist with decreasing pain with return to work the affect of pain on this pt's daily activities.   Rehab Potential Good   Clinical Impairments Affecting Rehab Potential lives alone, job requirements need to sit on low surface prlonged periods    PT Frequency 1x / week   PT Duration 6 weeks   PT Treatment/Interventions ADLs/Self Care Home Management;Cryotherapy;Electrical Stimulation;Traction;Moist Heat;Therapeutic exercise;Patient/family education;Manual techniques;Dry needling   PT Next Visit Plan Review HEP and advance as appropriate; continue with thoracic mobilizations, scalene/upper trap stretches, first rib mobilizations, DNF strengthening   PT Home Exercise Plan Pec stretch, cervical retractions, straight arm rows, upper trap/scalene stretch, shoulder rolls, posture correction, prayer stretch   Consulted and Agree with Plan of Care Patient      Patient will benefit from skilled therapeutic intervention in order to improve the following deficits and impairments:  Postural dysfunction, Increased muscle spasms, Decreased activity  tolerance, Decreased range of motion, Decreased strength, Hypomobility, Impaired flexibility  Visit Diagnosis: Cervicalgia - Plan: PT plan of care cert/re-cert  Abnormal posture - Plan: PT plan of care cert/re-cert       G-Codes - June 01, 2016 1626    Functional Assessment Tool Used NDI, ROM, clinical judgement   Functional Limitation Changing and maintaining body position   Changing and Maintaining Body Position Current Status [id number]) At least  1 percent but less than 20 percent impaired, limited or restricted   Changing and Maintaining Body Position Goal Status [id number]) At least 1 percent but less than 20 percent impaired, limited or restricted      Problem List Patient Active Problem List   Diagnosis Date Noted  . Bilateral leg edema 06/05/2015  . Chronic neck pain 06/05/2015  . Plantar fasciitis 02/01/2014  . Visual floaters 04/04/2013  . Subjective vision disturbance, left 04/04/2013  . 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 MELANOMA, HX OF 06/01/2009  . NECK PAIN 09/11/2008  . CELLULITIS AND ABSCESS OF LEG EXCEPT FOOT 04/26/2008  . Acute sinusitis 01/06/2008  . ERECTILE DYSFUNCTION 10/12/2007  . HYPERLIPIDEMIA 04/12/2007  . Anxiety state 04/12/2007  . Essential hypertension 04/12/2007  . GERD 04/12/2007    Collie Siad PT, DPT 05/22/2016, 5:05 PM  [place] MAIN [facility] SERVICES [address] [place], [place], [address] Phone: [phone]   Fax:  [phone]  Name: [name] MRN: [id number] Date of Birth: 03/07/1951

## 2016-05-27 ENCOUNTER — Ambulatory Visit: Payer: Medicare Other | Attending: Family Medicine

## 2016-05-27 DIAGNOSIS — R293 Abnormal posture: Secondary | ICD-10-CM

## 2016-05-27 DIAGNOSIS — M542 Cervicalgia: Secondary | ICD-10-CM | POA: Diagnosis present

## 2016-05-27 NOTE — Therapy (Signed)
[place] MAIN [facility] SERVICES [address] [place], [place], [address] Phone: [phone]   Fax:  [phone]  Physical Therapy Treatment  Patient Details  Name: [name] MRN: [id number] Date of Birth: Aug 03, 1951 Referring Provider: Sarajane Jews  Encounter Date: 05/27/2016      PT End of Session - 05/27/16 0900    Visit Number 7   Number of Visits 15   Date for PT Re-Evaluation 07/03/16   Authorization Time Period 2/10   PT Start Time 0850   PT Stop Time 0940   PT Time Calculation (min) 50 min   Equipment Utilized During Treatment Gait belt   Activity Tolerance No increased pain   Behavior During Therapy [facility] for tasks assessed/performed      Past Medical History:  Diagnosis Date  . Anxiety   . ED (erectile dysfunction)   . GERD (gastroesophageal reflux disease)   . Hyperlipidemia   . Hypertension   . Kidney stone 07-29-11   passed   . Vitreous floater    left eye, sees Dr. Dawna [facility] at [facility]     Past Surgical History:  Procedure Laterality Date  . BASAL CELL CARCINOMA EXCISION     removed from scalp  . COLONOSCOPY  07-09-07   per Dr. Olevia Perches, clear, repeat in 10 yrs   . HERNIA REPAIR      There were no vitals filed for this visit.      Subjective Assessment - 05/27/16 0857    Subjective Pt arrives complaining of L sided neck pain today. He states that he believes that he slept in a wrong position for an extended period of time last night. Pt reports continued improvement at home. 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 1 hr   How long can you stand comfortably? unlimited   Diagnostic tests Xray   Patient Stated Goals reduce pain, reduce chance of reoccurance    Currently in Pain? Yes   Pain Score 3    Pain Location Neck   Pain Orientation  Right;Left   Pain Descriptors / Indicators Aching   Pain Type Chronic pain   Pain Onset More than a month ago         TREATMENT  Manual therapy Supine manual cervical traction 30 seconds on, 15 seconds off x 4 bouts; Suboccipital release 30 seconds x 2; R scalene stretch 30 seconds x 2; R first rib mobilizations grade II, 20 seconds/bout x 3 bouts; Grade III central PA mobilizations C3-C6 30 seconds/bout, 2 bouts/level; Foam roller extension x 5 bouts/level moving gradually from lower to upper thoracic spine; STM to R scalenes, SCM, and upper trap. Pt requires moderate cues for form with foam roller extension;   Ther-ex Cervical retractions 5 second hold x 5; Cervical isometrics for lateral flexion, rotation, protraction, and upper cervical flexion (nods) 5 second hold x 5 each; Deep neck flexor strengthening (retraction, upper cervical nod, hold 1" off table) 5 second hold x 5 (issued to HEP);                    PT Education - 05/27/16 0859    Education provided Yes   Education Details HEP reviewed, DNF strengthening issued   Person(s) Educated Patient   Methods Explanation;Demonstration;Verbal cues;Tactile cues;Handout   Comprehension Verbalized understanding;Returned  demonstration;Verbal cues required             PT Long Term Goals - 05/22/16 1623      PT LONG TERM GOAL #1   Title pt will improve cervical rotation by 10 deg with pain <3/10 each way to aid with driving    Time 6   Period Weeks   Status On-going     PT LONG TERM GOAL #2   Title pt will demonstrate proper sitting /standing posture, needing < 3 cues to correct during PT session    Time 6   Period Weeks   Status On-going     PT LONG TERM GOAL #3   Title pt will be inependent and compliant with HEP   Time 6   Period Weeks   Status On-going     PT LONG TERM GOAL #4   Title pt will be able to sit and work x 4 hours without increased neck pain    Time 6   Period Weeks    Status On-going     PT LONG TERM GOAL #5   Title NDI will improve by 10% to indicate decrease in affect of pain on pt's daily activities   Baseline 12%   Time 6   Period Weeks   Status New               Plan - 05/27/16 0900    Clinical Impression Statement Pt reports continued improvement in symptoms. 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 Pt provided DNF strengthening exercise to add to HEP. Existing HEP reviewed with patient. Green and blue therabands issued to progress home program. Pt will benefit from continued skilled PT services to address neck pain and weakness.   Rehab Potential Good   Clinical Impairments Affecting Rehab Potential lives alone, job requirements need to sit on low surface prlonged periods    PT Frequency 1x / week   PT Duration 6 weeks   PT Treatment/Interventions ADLs/Self Care Home Management;Cryotherapy;Electrical Stimulation;Traction;Moist Heat;Therapeutic exercise;Patient/family education;Manual techniques;Dry needling   PT Next Visit Plan Review HEP and advance as appropriate; continue with thoracic mobilizations, scalene/upper trap stretches, first rib mobilizations, DNF strengthening   PT Home Exercise Plan Pec stretch, cervical retractions, straight arm rows, upper trap/scalene stretch, shoulder rolls, posture correction, prayer stretch, supine DNF strengthening   Consulted and Agree with Plan of Care Patient      Patient will benefit from skilled therapeutic intervention in order to improve the following deficits and impairments:  Postural dysfunction, Increased muscle spasms, Decreased activity tolerance, Decreased range of motion, Decreased strength, Hypomobility, Impaired flexibility  Visit Diagnosis: Cervicalgia  Abnormal posture     Problem List Patient Active Problem List   Diagnosis Date Noted  . Bilateral leg edema 06/05/2015  . Chronic neck  pain 06/05/2015  . Plantar fasciitis 02/01/2014  . Visual floaters 04/04/2013  . Subjective vision disturbance, left 04/04/2013  . 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 MELANOMA, HX OF 06/01/2009  . NECK PAIN 09/11/2008  . CELLULITIS AND ABSCESS OF LEG EXCEPT FOOT 04/26/2008  . Acute sinusitis 01/06/2008  . ERECTILE DYSFUNCTION 10/12/2007  . HYPERLIPIDEMIA 04/12/2007  . Anxiety state 04/12/2007  . Essential hypertension 04/12/2007  . GERD 04/12/2007   [name] PT, DPT   Patirica Longshore 05/27/2016, 1:03 PM  [place] MAIN REHAB SERVICES [place],  [place], [address] Phone: [phone]   Fax:  [phone]  Name: [name] MRN: [id number] Date of Birth: 10/24/1950

## 2016-05-27 NOTE — Patient Instructions (Addendum)
  Nod: Deep Cervical Flexor Retrain - Supine    Push head backwards straight into pillow. Nod head, tipping chin down toward chest. Do not move neck forward, just rotate head in space. Tighten muscles in back of throat and gently lift head approximately 1 inch off surface. Try to keep superficial muscles relaxed and use deep neck muscles. Hold _5__ seconds. Do _10__ times, _2__ times per day.

## 2016-06-04 ENCOUNTER — Ambulatory Visit: Payer: Medicare Other | Admitting: Physical Therapy

## 2016-06-04 ENCOUNTER — Encounter: Payer: Self-pay | Admitting: Physical Therapy

## 2016-06-04 VITALS — BP 132/76 | HR 80

## 2016-06-04 DIAGNOSIS — R293 Abnormal posture: Secondary | ICD-10-CM

## 2016-06-04 DIAGNOSIS — M542 Cervicalgia: Secondary | ICD-10-CM

## 2016-06-04 NOTE — Therapy (Signed)
[place] MAIN [facility] SERVICES [address] [place], [place], [address] Phone: [phone]   Fax:  [phone]  Physical Therapy Treatment  Patient Details  Name: [name] MRN: [id number] Date of Birth: Aug 24, 1951 Referring Provider: Sarajane Jews  Encounter Date: 06/04/2016      PT End of Session - 06/04/16 1711    Visit Number 8   Number of Visits 15   Date for PT Re-Evaluation 07/03/16   Authorization Time Period 3/10   PT Start Time [id number]   PT Stop Time 1702   PT Time Calculation (min) 45 min   Equipment Utilized During Treatment Gait belt   Activity Tolerance No increased pain;Patient tolerated treatment well   Behavior During Therapy [facility] for tasks assessed/performed      Past Medical History:  Diagnosis Date  . Anxiety   . ED (erectile dysfunction)   . GERD (gastroesophageal reflux disease)   . Hyperlipidemia   . Hypertension   . Kidney stone 07-29-11   passed   . Vitreous floater    left eye, sees Dr. Dawna Part at [facility]     Past Surgical History:  Procedure Laterality Date  . BASAL CELL CARCINOMA EXCISION     removed from scalp  . COLONOSCOPY  07-09-07   per Dr. Olevia Perches, clear, repeat in 10 yrs   . HERNIA REPAIR      Vitals:   06/04/16 1625  BP: 132/76  Pulse: 80  SpO2: 100%        Subjective Assessment - 06/04/16 1625    Subjective Pt's pain has increased due to his continous days at work.  Worked Mon-Wed last week and this week.  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  Has constant pain when at work.  Has pain while driving which eases when his arm is supported.  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  He has had some relief with heat.   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 1 hr   How long can you stand comfortably? unlimited   Diagnostic tests Xray   Patient Stated Goals reduce pain, reduce chance of reoccurance    Currently in Pain? Yes   Pain Score 4    Pain Location Neck   Pain Orientation Right   Pain Descriptors / Indicators Aching;Throbbing   Pain Type Chronic pain   Pain Onset More than a month ago   Pain Relieving Factors heat      TREATMENT  Manual therapy  Supine manual cervical traction 30 seconds on, 15 seconds off x 4 bouts;  STM to upper trap with + pain referral to base of skull. Pt expresses this as "where I've had my pain"  Suboccipital release 30 seconds x 2;  R scalene stretch 30 seconds x 2;  R upper trap stretch 30 seconds x2;  R first rib mobilizations grade II, 20 seconds/bout x 3 bouts;  MWM into R cervical rotation 2x10 reps, denies pain   Ther-ex  Cervical retractions 5 second hold x 10;  Cervical isometrics for lateral flexion, rotation, and upper cervical flexion (nods) 5 second hold x 5 each;              PT Education - 06/04/16 1710    Education provided Yes   Education Details Continue  with heat up to 3x/day for ~20 minutes at a time; continue with HEP as given; Exercise technique   Person(s) Educated Patient   Methods Explanation;Demonstration;Tactile cues;Verbal cues   Comprehension Verbalized understanding;Returned demonstration             PT Long Term Goals - 05/22/16 1623      PT LONG TERM GOAL #1   Title pt will improve cervical rotation by 10 deg with pain <3/10 each way to aid with driving    Time 6   Period Weeks   Status On-going     PT LONG TERM GOAL #2   Title pt will demonstrate proper sitting /standing posture, needing < 3 cues to correct during PT session    Time 6   Period Weeks   Status On-going     PT LONG TERM GOAL #3   Title pt will be inependent and compliant with HEP   Time 6   Period Weeks   Status On-going     PT LONG TERM GOAL #4   Title pt will be able to sit and work x 4 hours without increased neck pain     Time 6   Period Weeks   Status On-going     PT LONG TERM GOAL #5   Title NDI will improve by 10% to indicate decrease in affect of pain on pt's daily activities   Baseline 12%   Time 6   Period Weeks   Status New               Plan - 06/04/16 1712    Clinical Impression Statement Pt reports decrease in pain with MWM with cervical AROM exercise.  He responded well to all interventions this date.  Encouraged pt to purchase work chair as discussed in prior sessions, he has found one on sale that he says is affordable.  Recommended pt to continue with heat at home as this relieves his pain.  Pt will benefit from skilled PT interventions to decrease pain and improve QOL.   Rehab Potential Good   Clinical Impairments Affecting Rehab Potential lives alone, job requirements need to sit on low surface prlonged periods    PT Frequency 1x / week   PT Duration 6 weeks   PT Treatment/Interventions ADLs/Self Care Home Management;Cryotherapy;Electrical Stimulation;Traction;Moist Heat;Therapeutic exercise;Patient/family education;Manual techniques;Dry needling   PT Next Visit Plan Thoracic mobilizations, scalene/upper trap stretches, first rib mobilizations, DNF strengthening, cervical MWM   PT Home Exercise Plan Pec stretch, cervical retractions, straight arm rows, upper trap/scalene stretch, shoulder rolls, posture correction, prayer stretch, supine DNF strengthening   Consulted and Agree with Plan of Care Patient      Patient will benefit from skilled therapeutic intervention in order to improve the following deficits and impairments:  Postural dysfunction, Increased muscle spasms, Decreased activity tolerance, Decreased range of motion, Decreased strength, Hypomobility, Impaired flexibility  Visit Diagnosis: Cervicalgia  Abnormal posture     Problem List Patient Active Problem List   Diagnosis Date Noted  . Bilateral leg edema 06/05/2015  . Chronic neck pain 06/05/2015  . Plantar  fasciitis 02/01/2014  . Visual floaters 04/04/2013  . Subjective vision disturbance, left 04/04/2013  . 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 MELANOMA, HX OF 06/01/2009  . NECK PAIN 09/11/2008  . CELLULITIS AND ABSCESS OF LEG EXCEPT FOOT 04/26/2008  . Acute sinusitis 01/06/2008  . ERECTILE DYSFUNCTION 10/12/2007  . HYPERLIPIDEMIA 04/12/2007  . Anxiety state 04/12/2007  . Essential hypertension 04/12/2007  .  GERD 04/12/2007    Collie Siad PT, DPT 06/04/2016, 5:15 PM  [place] MAIN [facility] SERVICES [address] [place], [place], [address] Phone: [phone]   Fax:  [phone]  Name: [name] MRN: [id number] Date of Birth: 1951/02/27

## 2016-06-10 ENCOUNTER — Ambulatory Visit (INDEPENDENT_AMBULATORY_CARE_PROVIDER_SITE_OTHER): Payer: Medicare Other | Admitting: Family Medicine

## 2016-06-10 ENCOUNTER — Encounter: Payer: Self-pay | Admitting: Family Medicine

## 2016-06-10 VITALS — BP 121/71 | HR 76 | Temp 98.6°F | Ht 73.0 in | Wt 192.0 lb

## 2016-06-10 DIAGNOSIS — I10 Essential (primary) hypertension: Secondary | ICD-10-CM | POA: Diagnosis not present

## 2016-06-10 DIAGNOSIS — J019 Acute sinusitis, unspecified: Secondary | ICD-10-CM

## 2016-06-10 DIAGNOSIS — R600 Localized edema: Secondary | ICD-10-CM

## 2016-06-10 MED ORDER — AZITHROMYCIN 250 MG PO TABS: ORAL_TABLET | ORAL | 0 refills | Status: DC

## 2016-06-10 MED ORDER — HYDROCODONE-HOMATROPINE 5-1.5 MG/5ML PO SYRP: 5.0000 mL | ORAL_SOLUTION | ORAL | 0 refills | Status: DC | PRN

## 2016-06-10 NOTE — Progress Notes (Signed)
   Subjective:    Patient ID: [name], male    DOB: June 30, 1951, 65 y.o.   MRN: [id number]  HPI Here for several issues. First he has noticed swelling in the ankles at the end of the day for the past month. No pain or discomfort. No SOB. His renal function was normal by labs in July. He takes Amlodipine and Lisinopril for HTN. He has reduced his sodium intake and his BP has been very well controlled. Also for the past 6 days he has had stuffy head, PND, ST, and is coughing up yellow sputum.    Review of Systems  Constitutional: Negative.   HENT: Positive for congestion, postnasal drip, sinus pressure and sore throat.   Eyes: Negative.   Respiratory: Positive for cough. Negative for shortness of breath and wheezing.   Cardiovascular: Positive for leg swelling. Negative for chest pain and palpitations.       Objective:   Physical Exam  Constitutional: He is oriented to person, place, and time. He appears well-developed and well-nourished.  HENT:  Right Ear: External ear normal.  Left Ear: External ear normal.  Nose: Nose normal.  Mouth/Throat: Oropharynx is clear and moist.  Eyes: Conjunctivae are normal.  Neck: Neck supple. No thyromegaly present.  Cardiovascular: Normal rate, regular rhythm, normal heart sounds and intact distal pulses.   Pulmonary/Chest: Effort normal and breath sounds normal. No respiratory distress. He has no wheezes. He has no rales.  Musculoskeletal:  2+ edema on the left ankle and 1+ edema on the right ankle   Lymphadenopathy:    He has no cervical adenopathy.  Neurological: He is alert and oriented to person, place, and time.          Assessment & Plan:  His ankle edema is probably a side effect of the Amlodipine. Since his BP is stable, we will stop the Amlodipine and continue on Lisinopril alone. He will monitor the BP closely at home. Treat the sinusitis with a Zpack.

## 2016-06-10 NOTE — Progress Notes (Signed)
Pre visit review using our clinic review tool, if applicable. No additional management support is needed unless otherwise documented below in the visit note. 

## 2016-06-11 ENCOUNTER — Ambulatory Visit: Payer: Medicare Other | Admitting: Sports Medicine

## 2016-06-11 ENCOUNTER — Ambulatory Visit: Payer: Medicare Other | Admitting: Physical Therapy

## 2016-06-17 ENCOUNTER — Ambulatory Visit: Payer: Medicare Other | Admitting: Podiatry

## 2016-06-18 ENCOUNTER — Ambulatory Visit: Payer: Medicare Other

## 2016-06-18 DIAGNOSIS — M542 Cervicalgia: Secondary | ICD-10-CM | POA: Diagnosis not present

## 2016-06-18 DIAGNOSIS — R293 Abnormal posture: Secondary | ICD-10-CM

## 2016-06-19 NOTE — Therapy (Signed)
[place] MAIN [facility] SERVICES [address] [place], [place], [address] Phone: [phone]   Fax:  [phone]  Physical Therapy Treatment  Patient Details  Name: [name] MRN: [id number] Date of Birth: Jul 02, 1951 Referring Provider: Sarajane Jews  Encounter Date: 06/18/2016      PT End of Session - 06/19/16 0956    Visit Number 9   Number of Visits 15   Date for PT Re-Evaluation 07/03/16   Authorization Time Period 4/10   PT Start Time 1521   PT Stop Time 1600   PT Time Calculation (min) 39 min   Activity Tolerance Patient tolerated treatment well   Behavior During Therapy [facility] for tasks assessed/performed      Past Medical History:  Diagnosis Date  . Anxiety   . ED (erectile dysfunction)   . GERD (gastroesophageal reflux disease)   . Hyperlipidemia   . Hypertension   . Kidney stone 07-29-11   passed   . Vitreous floater    left eye, sees Dr. Dawna Part at [facility]     Past Surgical History:  Procedure Laterality Date  . BASAL CELL CARCINOMA EXCISION     removed from scalp  . COLONOSCOPY  07-09-07   per Dr. Olevia Perches, clear, repeat in 10 yrs   . HERNIA REPAIR      There were no vitals filed for this visit.      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 Patient states R trap pain at a 2/10.    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 1 hr   How long can you stand comfortably? unlimited   Diagnostic tests Xray   Patient Stated Goals reduce pain, reduce chance of reoccurance    Currently in Pain? Yes   Pain Score 2    Pain Location Neck   Pain Orientation Right   Pain Descriptors / Indicators Aching   Pain Type Chronic pain   Pain Onset More than a month ago     TREATMENT Manual therapy  STM usiing superficial and deep techniques to the upper trap  and levator scapulae on the right  With the patient positioned in sitting.    Ther-ex  Scapular retraction at matrix - x 20 12.5# Scapular High row in sitting at Matrix - x 20 12.5# Seated ball roll outs in sitting - x15 with 5 sec Push up PLUS against wall - x 15 with cueing on shoulder position Straight arm push downs at matrix - x15 12.5# on each side *patient required frequent cueing on scapular and neck positioning throughout entireity of treatment session.       PT Education - 06/19/16 0955    Education provided Yes   Education Details Educated to continue to perform HEP and appropriate shoulder and scapular positioning   Person(s) Educated Patient   Methods Explanation;Demonstration   Comprehension Returned demonstration;Verbalized understanding             PT Long Term Goals - 05/22/16 1623      PT LONG TERM GOAL #1   Title pt will improve cervical rotation by 10 deg with pain <3/10 each way to aid with driving    Time 6   Period Weeks   Status On-going     PT LONG TERM GOAL #2   Title pt will demonstrate proper sitting /standing posture, needing <  3 cues to correct during PT session    Time 6   Period Weeks   Status On-going     PT LONG TERM GOAL #3   Title pt will be inependent and compliant with HEP   Time 6   Period Weeks   Status On-going     PT LONG TERM GOAL #4   Title pt will be able to sit and work x 4 hours without increased neck pain    Time 6   Period Weeks   Status On-going     PT LONG TERM GOAL #5   Title NDI will improve by 10% to indicate decrease in affect of pain on pt's daily activities   Baseline 12%   Time 6   Period Weeks   Status New               Plan - 06/18/16 1502    Clinical Impression Statement Focused on performing scapular stabilization exercises and motor control of the scapular retractors. Patient demonstrates improvement in scapular retraction after tactile and verbal cueing which is sustained throughout  treatment indicating funcitonal carryover. Although patient is improving, he continues to experience increased neck/shoulder pain and will benefit from further skilled therapy to return to prior level of function.    Rehab Potential Good   Clinical Impairments Affecting Rehab Potential lives alone, job requirements need to sit on low surface prlonged periods    PT Frequency 1x / week   PT Duration 6 weeks   PT Treatment/Interventions ADLs/Self Care Home Management;Cryotherapy;Electrical Stimulation;Traction;Moist Heat;Therapeutic exercise;Patient/family education;Manual techniques;Dry needling   PT Next Visit Plan Thoracic mobilizations, scalene/upper trap stretches, first rib mobilizations, DNF strengthening, cervical MWM   PT Home Exercise Plan Pec stretch, cervical retractions, straight arm rows, upper trap/scalene stretch, shoulder rolls, posture correction, prayer stretch, supine DNF strengthening   Consulted and Agree with Plan of Care Patient      Patient will benefit from skilled therapeutic intervention in order to improve the following deficits and impairments:  Postural dysfunction, Increased muscle spasms, Decreased activity tolerance, Decreased range of motion, Decreased strength, Hypomobility, Impaired flexibility  Visit Diagnosis: Cervicalgia  Abnormal posture     Problem List Patient Active Problem List   Diagnosis Date Noted  . Bilateral leg edema 06/05/2015  . Chronic neck pain 06/05/2015  . Plantar fasciitis 02/01/2014  . Visual floaters 04/04/2013  . Subjective vision disturbance, left 04/04/2013  . 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 MELANOMA, HX OF 06/01/2009  . NECK PAIN 09/11/2008  . CELLULITIS AND ABSCESS OF LEG EXCEPT FOOT 04/26/2008  . Acute sinusitis 01/06/2008  . ERECTILE DYSFUNCTION 10/12/2007  . HYPERLIPIDEMIA 04/12/2007  . Anxiety state 04/12/2007  . Essential hypertension 04/12/2007  . GERD 04/12/2007    Blythe Stanford, PT  DPT 06/19/2016, 10:06 AM  [place] MAIN [facility] SERVICES [address] [place], [place], [address] Phone: [phone]   Fax:  [phone]  Name: Atticus Xayavong MRN: [id number] Date of Birth: 11/19/1950

## 2016-06-27 ENCOUNTER — Ambulatory Visit: Payer: Medicare Other | Attending: Family Medicine

## 2016-06-27 DIAGNOSIS — R293 Abnormal posture: Secondary | ICD-10-CM

## 2016-06-27 DIAGNOSIS — M542 Cervicalgia: Secondary | ICD-10-CM | POA: Diagnosis present

## 2016-06-27 NOTE — Therapy (Signed)
[place] MAIN [facility] SERVICES [address] [place], [place], [address] Phone: [phone]   Fax:  [phone]  Physical Therapy Treatment  Patient Details  Name: [name] MRN: [id number] Date of Birth: [birth year] Referring Provider: Sarajane Jews  Encounter Date: 06/27/2016      PT End of Session - 06/27/16 0915    Visit Number 10   Number of Visits 15   Date for PT Re-Evaluation 07/03/16   Authorization Time Period 5/10   PT Start Time 0903   PT Stop Time 0945   PT Time Calculation (min) 42 min   Activity Tolerance Patient tolerated treatment well   Behavior During Therapy [facility] for tasks assessed/performed      Past Medical History:  Diagnosis Date  . Anxiety   . ED (erectile dysfunction)   . GERD (gastroesophageal reflux disease)   . Hyperlipidemia   . Hypertension   . Kidney stone 07-29-11   passed   . Vitreous floater    left eye, sees Dr. Dawna [facility]     Past Surgical History:  Procedure Laterality Date  . BASAL CELL CARCINOMA EXCISION     removed from scalp  . COLONOSCOPY  07-09-07   per Dr. Olevia Perches, clear, repeat in 10 yrs   . HERNIA REPAIR      There were no vitals filed for this visit.      Subjective Assessment - 06/27/16 0906    Subjective Patient states pain in the neck worsens as the day progresses because he requires to maintain constant neck flexion. Patient able to decrease pain faster than before starting therapy.    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 1 hr   How long can you stand comfortably? unlimited   Diagnostic tests Xray   Patient Stated Goals reduce pain, reduce chance of reoccurance    Currently in Pain? Yes   Pain Score 1    Pain Location Neck   Pain Orientation Right   Pain Descriptors / Indicators Aching   Pain Type Chronic pain   Pain Onset More than a month ago      TREATMENT  Ther-ex  Seated ball roll  outs in sitting - x15 with 5 sec with blue physioball  Push up with PLUS against wall - 2 x 15 with cueing on shoulder position Shoulder flexion on physioball with forearms on the wall - x 15 Scapular retraction at matrix - x 20 20# Scapular High row in sitting at Matrix - x 20 20# Straight arm push downs at matrix - x15 10# on each side Scapular depression at Matrix - x15 17.5# on each side *patient required frequent cueing on scapular and neck positioning throughout entirity of treatment session.          PT Education - 06/27/16 0915    Education provided Yes   Education Details Educated on scapular and neck position   Person(s) Educated Patient   Methods Explanation;Demonstration   Comprehension Verbalized understanding;Returned demonstration             PT Long Term Goals - 05/22/16 1623      PT LONG TERM GOAL #1   Title pt will improve cervical rotation by 10 deg with pain <3/10 each way to aid with driving    Time 6   Period Weeks   Status On-going     PT LONG TERM GOAL #2   Title pt  will demonstrate proper sitting /standing posture, needing < 3 cues to correct during PT session    Time 6   Period Weeks   Status On-going     PT LONG TERM GOAL #3   Title pt will be inependent and compliant with HEP   Time 6   Period Weeks   Status On-going     PT LONG TERM GOAL #4   Title pt will be able to sit and work x 4 hours without increased neck pain    Time 6   Period Weeks   Status On-going     PT LONG TERM GOAL #5   Title NDI will improve by 10% to indicate decrease in affect of pain on pt's daily activities   Baseline 12%   Time 6   Period Weeks   Status New               Plan - 06/27/16 0934    Clinical Impression Statement Continued to focus on improving muscular endurance and coordination of the scapular retraction musculature. Patient demonstrates decreased motor control requiring constant tactile and verbal cueing throughout session and  patient will benefit from further skilled therapy to return to prior level of function   Rehab Potential Good   Clinical Impairments Affecting Rehab Potential lives alone, job requirements need to sit on low surface prlonged periods    PT Frequency 1x / week   PT Duration 6 weeks   PT Treatment/Interventions ADLs/Self Care Home Management;Cryotherapy;Electrical Stimulation;Traction;Moist Heat;Therapeutic exercise;Patient/family education;Manual techniques;Dry needling   PT Next Visit Plan Thoracic mobilizations, scalene/upper trap stretches, first rib mobilizations, DNF strengthening, cervical MWM   PT Home Exercise Plan Pec stretch, cervical retractions, straight arm rows, upper trap/scalene stretch, shoulder rolls, posture correction, prayer stretch, supine DNF strengthening   Consulted and Agree with Plan of Care Patient      Patient will benefit from skilled therapeutic intervention in order to improve the following deficits and impairments:  Postural dysfunction, Increased muscle spasms, Decreased activity tolerance, Decreased range of motion, Decreased strength, Hypomobility, Impaired flexibility  Visit Diagnosis: Cervicalgia  Abnormal posture     Problem List Patient Active Problem List   Diagnosis Date Noted  . Bilateral leg edema 06/05/2015  . Chronic neck pain 06/05/2015  . Plantar fasciitis 02/01/2014  . Visual floaters 04/04/2013  . Subjective vision disturbance, left 04/04/2013  . 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 MELANOMA, HX OF 06/01/2009  . NECK PAIN 09/11/2008  . CELLULITIS AND ABSCESS OF LEG EXCEPT FOOT 04/26/2008  . Acute sinusitis 01/06/2008  . ERECTILE DYSFUNCTION 10/12/2007  . HYPERLIPIDEMIA 04/12/2007  . Anxiety state 04/12/2007  . Essential hypertension 04/12/2007  . GERD 04/12/2007    Blythe Stanford, PT DPT 06/27/2016, 11:46 AM  [place] MAIN [facility] SERVICES [address] [place], [place],  [address] Phone: [phone]   Fax:  [phone]  Name: [name] MRN: [id number] Date of Birth: 08-Dec-1950

## 2016-07-03 ENCOUNTER — Ambulatory Visit: Payer: Medicare Other | Admitting: Physical Therapy

## 2016-07-04 ENCOUNTER — Ambulatory Visit: Payer: Medicare Other

## 2016-07-04 DIAGNOSIS — M542 Cervicalgia: Secondary | ICD-10-CM

## 2016-07-04 DIAGNOSIS — R293 Abnormal posture: Secondary | ICD-10-CM

## 2016-07-04 NOTE — Therapy (Signed)
[place] MAIN [facility] SERVICES [address] [place], [place], [address] Phone: [phone]   Fax:  [phone]  Physical Therapy Treatment  Patient Details  Name: [name] MRN: [id number] Date of Birth: Jun 05, 1951 Referring Provider: Sarajane Jews  Encounter Date: 07/04/2016      PT End of Session - 07/04/16 1104    Visit Number 11   Number of Visits 23   Date for PT Re-Evaluation 08/29/16   Authorization Time Period 6/10   PT Start Time 1045   PT Stop Time 1130   PT Time Calculation (min) 45 min   Activity Tolerance Patient tolerated treatment well   Behavior During Therapy [facility] for tasks assessed/performed      Past Medical History:  Diagnosis Date  . Anxiety   . ED (erectile dysfunction)   . GERD (gastroesophageal reflux disease)   . Hyperlipidemia   . Hypertension   . Kidney stone 07-29-11   passed   . Vitreous floater    left eye, sees Dr. Dawna Part at [facility]     Past Surgical History:  Procedure Laterality Date  . BASAL CELL CARCINOMA EXCISION     removed from scalp  . COLONOSCOPY  07-09-07   per Dr. Olevia Perches, clear, repeat in 10 yrs   . HERNIA REPAIR      There were no vitals filed for this visit.      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 1 hr   How long can you stand comfortably? unlimited   Diagnostic tests Xray   Patient Stated Goals reduce pain, reduce chance of reoccurance    Currently in Pain? No/denies   Pain Onset --         Assessment: Cervical Flexion: 60 Cervical extension: 40 - slight pain on right side Right Rotation: 70 Left Rotation: 80 Right Sidebend: 40 - slight stretch  Left Sidebend: 40 - slight stretch  NDI: 14%  TREATMENT  Ther-ex   Seated ball roll outs in sitting - x15 with 5 sec with blue physioball  Scapular High row in sitting at Matrix - x 20 20# Push up with PLUS against wall -  x 15 with cueing on shoulder position Prone scapular retraction with arms at sides - x15 - frequent cueing for proper muscular activation Seated scapular depression with integration into reaching movements - x15  Manual Therapy STM to right upper trap and tripper point release along levator scap and upper trap with patient positioned in sitting.  *patient required frequent cueing on scapular and neck positioning throughout entirity of treatment session.       PT Education - 07/04/16 1154    Education provided Yes   Education Details Educated on scapular muscular activation and neck positioning   Person(s) Educated Patient   Methods Explanation;Demonstration   Comprehension Verbalized understanding;Returned demonstration             PT Long Term Goals - 07/04/16 1055      PT LONG TERM GOAL #1   Title pt will improve cervical rotation by 10 deg with pain <3/10 each way to aid with driving    Baseline [id number]: Full cerv ROM with 2/10 pain   Time 6   Period Weeks   Status  Achieved     PT LONG TERM GOAL #2   Title pt will demonstrate proper sitting /standing posture, needing < 3 cues to correct during PT session    Baseline 07/04/16: Requires frequent cueing on scapular retraction activation during exercise    Time 6   Period Weeks   Status On-going     PT LONG TERM GOAL #3   Title pt will be independent and compliant with HEP   Baseline 07/04/16: Requires minimal cueing for proper muscle activation    Time 6   Period Weeks   Status On-going     PT LONG TERM GOAL #4   Title pt will be able to sit and work x 4 hours without increased neck pain    Baseline 07/04/16: Pt is able to sit at work at 2 hours   Time 6   Period Weeks   Status On-going     PT LONG TERM GOAL #5   Title NDI will improve by 10% to  indicate decrease in affect of pain on pt's daily activities   Baseline 12% 07/04/16: 14%   Time 6   Period Weeks   Status On-going               Plan - 07/04/16 1156    Clinical Impression Statement Patient demonstrates decreased resting pain, improved pain free AROM, and strength compared to previous visits indicating functional carryover between treatment sessions. Although patient is improving he continues to require frequent cueing on proper scapular muscular activation, requires advancement in HEP, and demonstrates decreased motor control with neck/scapular movements. Discussed with patient on continuing therapy to work on current impairments and pt confirms/agrees on POC.     Rehab Potential Good   Clinical Impairments Affecting Rehab Potential lives alone, job requirements need to sit on low surface prlonged periods    PT Frequency 1x / week   PT Duration 6 weeks   PT Treatment/Interventions ADLs/Self Care Home Management;Cryotherapy;Electrical Stimulation;Traction;Moist Heat;Therapeutic exercise;Patient/family education;Manual techniques;Dry needling   PT Next Visit Plan Thoracic mobilizations, scalene/upper trap stretches, first rib mobilizations, DNF strengthening, cervical MWM   PT Home Exercise Plan Pec stretch, cervical retractions, straight arm rows, upper trap/scalene stretch, shoulder rolls, posture correction, prayer stretch, supine DNF strengthening   Consulted and Agree with Plan of Care Patient      Patient will benefit from skilled therapeutic intervention in order to improve the following deficits and impairments:  Postural dysfunction, Increased muscle spasms, Decreased activity tolerance, Decreased range of motion, Decreased strength, Hypomobility, Impaired flexibility  Visit Diagnosis: Cervicalgia  Abnormal posture     Problem List Patient Active Problem List   Diagnosis Date Noted  . Bilateral leg edema 06/05/2015  . Chronic neck pain 06/05/2015   . Plantar fasciitis 02/01/2014  . Visual floaters 04/04/2013  . Subjective vision disturbance, left 04/04/2013  . 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 MELANOMA, HX OF 06/01/2009  . NECK PAIN 09/11/2008  . CELLULITIS AND ABSCESS OF LEG EXCEPT FOOT 04/26/2008  . Acute sinusitis 01/06/2008  . ERECTILE DYSFUNCTION 10/12/2007  . HYPERLIPIDEMIA 04/12/2007  . Anxiety state 04/12/2007  . Essential hypertension 04/12/2007  . GERD 04/12/2007    Blythe Stanford, PT DPT 07/04/2016, 12:05 PM  [place] MAIN [facility] SERVICES [address] [place], [place], [address] Phone: [phone]   Fax:  [phone]  Name: Daruis Raudabaugh MRN: [id number] Date of Birth: 11/04/1950

## 2016-07-05 ENCOUNTER — Other Ambulatory Visit: Payer: Self-pay | Admitting: Family Medicine

## 2016-07-09 ENCOUNTER — Ambulatory Visit: Payer: Medicare Other

## 2016-07-09 DIAGNOSIS — M542 Cervicalgia: Secondary | ICD-10-CM

## 2016-07-09 DIAGNOSIS — R293 Abnormal posture: Secondary | ICD-10-CM

## 2016-07-10 NOTE — Therapy (Signed)
[place] MAIN [facility] SERVICES [address] [place], [place], [address] Phone: [phone]   Fax:  [phone]  Physical Therapy Treatment  Patient Details  Name: [name] MRN: [id number] Date of Birth: [birth year] Referring Provider: Sarajane Jews  Encounter Date: 07/09/2016      PT End of Session - 07/10/16 0921    Visit Number 12   Number of Visits 23   Date for PT Re-Evaluation 08/29/16   Authorization Time Period 7/10   PT Start Time 1640   PT Stop Time 1725   PT Time Calculation (min) 45 min   Activity Tolerance Patient tolerated treatment well   Behavior During Therapy [facility] for tasks assessed/performed      Past Medical History:  Diagnosis Date  . Anxiety   . ED (erectile dysfunction)   . GERD (gastroesophageal reflux disease)   . Hyperlipidemia   . Hypertension   . Kidney stone 07-29-11   passed   . Vitreous floater    left eye, sees Dr. Dawna Part at [facility]     Past Surgical History:  Procedure Laterality Date  . BASAL CELL CARCINOMA EXCISION     removed from scalp  . COLONOSCOPY  07-09-07   per Dr. Olevia Perches, clear, repeat in 10 yrs   . HERNIA REPAIR      There were no vitals filed for this visit.      Subjective Assessment - 07/09/16 1643    Subjective Patient states no current pain in the neck but states increased LBP from performing prolonged sitting. States he's beginning to feel better.    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 1 hr   How long can you stand comfortably? unlimited   Diagnostic tests Xray   Patient Stated Goals reduce pain, reduce chance of reoccurance    Currently in Pain? No/denies      TREATMENT  Ther-ex  Seated ball roll outs in sitting - x15 with 5 sec hold with blue physioball  Scapular High row in sitting at [employer] - x 20 20# Scapular retraction at [employer] - x20 17.5# TRX scapular pull ups - x20  Push up with PLUS  against wall -  x 15 with cueing on shoulder position Prone scapular retraction with arms at sides - x15 - frequent cueing for proper muscular activation  Manual Therapy STM to right upper trap and tripper point release along levator scap and upper trap with patient positioned in sitting.  *patient required frequent cueing on scapular and neck positioning throughout entirety of treatment session.         PT Education - 07/09/16 1649    Education provided Yes   Education Details Educated on scapular positioning during exercise   Person(s) Educated Patient   Methods Explanation;Demonstration   Comprehension Verbalized understanding;Returned demonstration             PT Long Term Goals - 07/04/16 1055      PT LONG TERM GOAL #1   Title pt will improve cervical rotation by 10 deg with pain <3/10 each way to aid with driving    Baseline [id number]: Full cerv ROM with 2/10 pain   Time 6   Period Weeks   Status Achieved     PT LONG TERM GOAL #2   Title pt will demonstrate proper sitting /standing posture, needing < 3 cues to correct during PT session    Baseline 07/04/16: Requires frequent cueing  on scapular retraction activation during exercise    Time 6   Period Weeks   Status On-going     PT LONG TERM GOAL #3   Title pt will be independent and compliant with HEP   Baseline 07/04/16: Requires minimal cueing for proper muscle activation    Time 6   Period Weeks   Status On-going     PT LONG TERM GOAL #4   Title pt will be able to sit and work x 4 hours without increased neck pain    Baseline 07/04/16: Pt is able to sit at work at 2 hours   Time 6   Period Weeks   Status On-going     PT LONG TERM GOAL #5   Title NDI will improve by 10% to indicate decrease in affect of pain on pt's daily activities   Baseline 12% 07/04/16: 14%   Time 6   Period Weeks   Status On-going               Plan - 07/10/16 [id number]    Clinical Impression Statement Patient  demonstrates decreased resting pain today and focused on improving motor control and muscular endurance of the cervical/scapular musculature. Patient requires less cueing throughout treatment session indicating functional carryover, although patient is improving he will continue to benefit from further skilled therapy to return to prior level of function.    Rehab Potential Good   Clinical Impairments Affecting Rehab Potential lives alone, job requirements need to sit on low surface prlonged periods    PT Frequency 1x / week   PT Duration 6 weeks   PT Treatment/Interventions ADLs/Self Care Home Management;Cryotherapy;Electrical Stimulation;Traction;Moist Heat;Therapeutic exercise;Patient/family education;Manual techniques;Dry needling   PT Next Visit Plan Thoracic mobilizations, scalene/upper trap stretches, first rib mobilizations, DNF strengthening, cervical MWM   PT Home Exercise Plan Pec stretch, cervical retractions, straight arm rows, upper trap/scalene stretch, shoulder rolls, posture correction, prayer stretch, supine DNF strengthening   Consulted and Agree with Plan of Care Patient      Patient will benefit from skilled therapeutic intervention in order to improve the following deficits and impairments:  Postural dysfunction, Increased muscle spasms, Decreased activity tolerance, Decreased range of motion, Decreased strength, Hypomobility, Impaired flexibility  Visit Diagnosis: Cervicalgia  Abnormal posture     Problem List Patient Active Problem List   Diagnosis Date Noted  . Bilateral leg edema 06/05/2015  . Chronic neck pain 06/05/2015  . Plantar fasciitis 02/01/2014  . Visual floaters 04/04/2013  . Subjective vision disturbance, left 04/04/2013  . 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 MELANOMA, HX OF 06/01/2009  . NECK PAIN 09/11/2008  . CELLULITIS AND ABSCESS OF LEG EXCEPT FOOT 04/26/2008  . Acute sinusitis 01/06/2008  . ERECTILE DYSFUNCTION 10/12/2007  .  HYPERLIPIDEMIA 04/12/2007  . Anxiety state 04/12/2007  . Essential hypertension 04/12/2007  . GERD 04/12/2007    Blythe Stanford, PT DPT 07/10/2016, 9:27 AM  [place] MAIN [facility] SERVICES [address] [place], [place], [address] Phone: [phone]   Fax:  [phone]  Name: [name] MRN: [id number] Date of Birth: Feb 14, 1951

## 2016-07-30 ENCOUNTER — Ambulatory Visit: Payer: Medicare Other | Attending: Family Medicine

## 2016-07-30 DIAGNOSIS — R293 Abnormal posture: Secondary | ICD-10-CM | POA: Diagnosis present

## 2016-07-30 DIAGNOSIS — M542 Cervicalgia: Secondary | ICD-10-CM

## 2016-07-30 NOTE — Therapy (Signed)
[place] MAIN [facility] SERVICES [address] [place], [place], [address] Phone: [phone]   Fax:  [phone]  Physical Therapy Treatment  Patient Details  Name: [name] MRN: [id number] Date of Birth: [birth year] Referring Provider: Sarajane Jews  Encounter Date: 07/30/2016      PT End of Session - 07/30/16 1735    Visit Number 13   Number of Visits 23   Date for PT Re-Evaluation 08/29/16   Authorization Time Period 8/10   PT Start Time 1650   PT Stop Time 1732   PT Time Calculation (min) 42 min   Activity Tolerance Patient tolerated treatment well   Behavior During Therapy [facility] for tasks assessed/performed      Past Medical History:  Diagnosis Date  . Anxiety   . ED (erectile dysfunction)   . GERD (gastroesophageal reflux disease)   . Hyperlipidemia   . Hypertension   . Kidney stone 07-29-11   passed   . Vitreous floater    left eye, sees Dr. Dawna Part at [facility]     Past Surgical History:  Procedure Laterality Date  . BASAL CELL CARCINOMA EXCISION     removed from scalp  . COLONOSCOPY  07-09-07   per Dr. Olevia Perches, clear, repeat in 10 yrs   . HERNIA REPAIR      There were no vitals filed for this visit.      Subjective Assessment - 07/30/16 1733    Subjective Patient reports his neck has been feeling better after reporting less aggravation of pain. States light pain in the neck today.    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 1 hr   How long can you stand comfortably? unlimited   Diagnostic tests Xray   Patient Stated Goals reduce pain, reduce chance of reoccurance    Currently in Pain? Yes   Pain Score 1    Pain Location Neck   Pain Orientation Right   Pain Descriptors / Indicators Aching   Pain Type Chronic pain      TREATMENT  Ther-ex  Seated ball roll outs in sitting - x20 with 5 sec hold with blue physioball Scapular retraction at [employer] - x20  17.5# Scapular High row in sitting at [employer] - x 20 22.5# Shoulder flexion with forearm on physioball - x 15 TRX scapular pull ups - x20  Lat pull down with tactile and verbal cueing for appropriate muscle activation - x 25   Manual Therapy STM to right upper trap and tripper point release along levator scap and upper trap with patient positioned in sitting.  *patient required frequent cueing on scapular and neck positioning throughout entirety of treatment session.        PT Education - 07/30/16 1735    Education provided Yes   Education Details Educated on scapular positioning and muscle activation   Person(s) Educated Patient   Methods Explanation;Demonstration   Comprehension Verbalized understanding;Returned demonstration             PT Long Term Goals - 07/04/16 1055      PT LONG TERM GOAL #1   Title pt will improve cervical rotation by 10 deg with pain <3/10 each way to aid with driving    Baseline [id number]: Full cerv ROM with 2/10 pain   Time 6   Period Weeks   Status Achieved     PT LONG TERM GOAL #2   Title pt will demonstrate  proper sitting /standing posture, needing < 3 cues to correct during PT session    Baseline 07/04/16: Requires frequent cueing on scapular retraction activation during exercise    Time 6   Period Weeks   Status On-going     PT LONG TERM GOAL #3   Title pt will be independent and compliant with HEP   Baseline 07/04/16: Requires minimal cueing for proper muscle activation    Time 6   Period Weeks   Status On-going     PT LONG TERM GOAL #4   Title pt will be able to sit and work x 4 hours without increased neck pain    Baseline 07/04/16: Pt is able to sit at work at 2 hours   Time 6   Period Weeks   Status On-going     PT LONG TERM GOAL #5   Title NDI will improve by 10% to indicate decrease in affect of pain on pt's daily activities   Baseline 12% 07/04/16: 14%   Time 6   Period Weeks   Status On-going                Plan - 07/30/16 1736    Clinical Impression Statement Patient continues to demonstrate improvement with resting pain levels and scapular/cervical function. Patient continues with decreased endurance and coordination with exercises and requires tactile/verbal cueing to perform and will benefit from further skilled therapy to return to prior level of function.    Rehab Potential Good   Clinical Impairments Affecting Rehab Potential lives alone, job requirements need to sit on low surface prlonged periods    PT Frequency 1x / week   PT Duration 6 weeks   PT Treatment/Interventions ADLs/Self Care Home Management;Cryotherapy;Electrical Stimulation;Traction;Moist Heat;Therapeutic exercise;Patient/family education;Manual techniques;Dry needling   PT Next Visit Plan Thoracic mobilizations, scalene/upper trap stretches, first rib mobilizations, DNF strengthening, cervical MWM   PT Home Exercise Plan Pec stretch, cervical retractions, straight arm rows, upper trap/scalene stretch, shoulder rolls, posture correction, prayer stretch, supine DNF strengthening   Consulted and Agree with Plan of Care Patient      Patient will benefit from skilled therapeutic intervention in order to improve the following deficits and impairments:  Postural dysfunction, Increased muscle spasms, Decreased activity tolerance, Decreased range of motion, Decreased strength, Hypomobility, Impaired flexibility  Visit Diagnosis: Cervicalgia  Abnormal posture     Problem List Patient Active Problem List   Diagnosis Date Noted  . Bilateral leg edema 06/05/2015  . Chronic neck pain 06/05/2015  . Plantar fasciitis 02/01/2014  . Visual floaters 04/04/2013  . Subjective vision disturbance, left 04/04/2013  . 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 MELANOMA, HX OF 06/01/2009  . NECK PAIN 09/11/2008  . CELLULITIS AND ABSCESS OF LEG EXCEPT FOOT 04/26/2008  . Acute sinusitis 01/06/2008  . ERECTILE DYSFUNCTION 10/12/2007   . HYPERLIPIDEMIA 04/12/2007  . Anxiety state 04/12/2007  . Essential hypertension 04/12/2007  . GERD 04/12/2007    Blythe Stanford, PT DPT 07/30/2016, 5:39 PM  [place] MAIN [facility] SERVICES [address] [place], [place], [address] Phone: [phone]   Fax:  [phone]  Name: Leonides Swilley MRN: [id number] Date of Birth: 09-26-1950

## 2016-08-13 ENCOUNTER — Ambulatory Visit: Payer: Medicare Other

## 2016-08-21 ENCOUNTER — Ambulatory Visit: Payer: Medicare Other

## 2016-08-27 ENCOUNTER — Ambulatory Visit: Payer: Medicare Other | Attending: Family Medicine

## 2016-08-27 DIAGNOSIS — M542 Cervicalgia: Secondary | ICD-10-CM | POA: Insufficient documentation

## 2016-08-27 DIAGNOSIS — R293 Abnormal posture: Secondary | ICD-10-CM | POA: Insufficient documentation

## 2016-08-28 NOTE — Therapy (Signed)
[place] MAIN [facility] SERVICES [address] [place], [place], [address] Phone: [phone]   Fax:  [phone]  Physical Therapy Treatment  Patient Details  Name: [name] MRN: [id number] Date of Birth: June 09, 1951 Referring Provider: Sarajane Jews  Encounter Date: 08/27/2016      PT End of Session - 08/27/16 1720    Visit Number 14   Number of Visits 23   Date for PT Re-Evaluation 08/29/16   Authorization Time Period 9/10   PT Start Time 1652   PT Stop Time 1730   PT Time Calculation (min) 38 min   Activity Tolerance Patient tolerated treatment well   Behavior During Therapy [facility] for tasks assessed/performed      Past Medical History:  Diagnosis Date  . Anxiety   . ED (erectile dysfunction)   . GERD (gastroesophageal reflux disease)   . Hyperlipidemia   . Hypertension   . Kidney stone 07-29-11   passed   . Vitreous floater    left eye, sees Dr. Dawna Part at [facility]     Past Surgical History:  Procedure Laterality Date  . BASAL CELL CARCINOMA EXCISION     removed from scalp  . COLONOSCOPY  07-09-07   per Dr. Olevia Perches, clear, repeat in 10 yrs   . HERNIA REPAIR      There were no vitals filed for this visit.      Subjective Assessment - 08/27/16 1715    Subjective Patient reports he has not had any pain in the neck but would like to do one more treatment to solidify his HEP.   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 1 hr   How long can you stand comfortably? unlimited   Diagnostic tests Xray   Patient Stated Goals reduce pain, reduce chance of reoccurance    Currently in Pain? No/denies       TREATMENT  Ther-ex  Seated ball roll outs in sitting - x20 with 5 sec hold with blue physioball Scapular retraction at [employer] - x20 17.5# Scapular High row in sitting at [employer] - x 20 22.5# Shoulder flexion with forearm on physioball - x 15 TRX scapular pull ups - x20   Lat pull down with tactile and verbal cueing for appropriate muscle activation - x 20 3 plates    Manual Therapy STM to right upper trap and tripper point release along levator scap and upper trap with patient positioned in sitting.  *patient required frequent cueing on scapular and neck positioning throughout entirety of treatment session.       PT Education - 08/27/16 1719    Education provided Yes   Education Details Educated on form and technique throughout session   Person(s) Educated Patient   Methods Explanation;Demonstration   Comprehension Verbalized understanding;Returned demonstration             PT Long Term Goals - 07/04/16 1055      PT LONG TERM GOAL #1   Title pt will improve cervical rotation by 10 deg with pain <3/10 each way to aid with driving    Baseline [id number]: Full cerv ROM with 2/10 pain   Time 6   Period Weeks   Status Achieved     PT LONG TERM GOAL #2   Title pt will demonstrate proper sitting /standing posture, needing < 3 cues to correct during PT session    Baseline 07/04/16: Requires frequent cueing on scapular retraction activation  during exercise    Time 6   Period Weeks   Status On-going     PT LONG TERM GOAL #3   Title pt will be independent and compliant with HEP   Baseline 07/04/16: Requires minimal cueing for proper muscle activation    Time 6   Period Weeks   Status On-going     PT LONG TERM GOAL #4   Title pt will be able to sit and work x 4 hours without increased neck pain    Baseline 07/04/16: Pt is able to sit at work at 2 hours   Time 6   Period Weeks   Status On-going     PT LONG TERM GOAL #5   Title NDI will improve by 10% to indicate decrease in affect of pain on pt's daily activities   Baseline 12% 07/04/16: 14%   Time 6   Period Weeks   Status On-going               Plan - 08/28/16 0958    Clinical Impression Statement Patient demonstrates no pain upon returning to the clinic indicating  functional carryover between sessions. Patient required less cueing to perform exercises today indicating improved muscular control/coordination. Patient states he will be prepared for discharge next visit after improving/advancing his HEP.    Rehab Potential Good   Clinical Impairments Affecting Rehab Potential lives alone, job requirements need to sit on low surface prlonged periods    PT Frequency 1x / week   PT Duration 6 weeks   PT Treatment/Interventions ADLs/Self Care Home Management;Cryotherapy;Electrical Stimulation;Traction;Moist Heat;Therapeutic exercise;Patient/family education;Manual techniques;Dry needling   PT Next Visit Plan Thoracic mobilizations, scalene/upper trap stretches, first rib mobilizations, DNF strengthening, cervical MWM   PT Home Exercise Plan Pec stretch, cervical retractions, straight arm rows, upper trap/scalene stretch, shoulder rolls, posture correction, prayer stretch, supine DNF strengthening   Consulted and Agree with Plan of Care Patient      Patient will benefit from skilled therapeutic intervention in order to improve the following deficits and impairments:  Postural dysfunction, Increased muscle spasms, Decreased activity tolerance, Decreased range of motion, Decreased strength, Hypomobility, Impaired flexibility  Visit Diagnosis: Cervicalgia  Abnormal posture     Problem List Patient Active Problem List   Diagnosis Date Noted  . Bilateral leg edema 06/05/2015  . Chronic neck pain 06/05/2015  . Plantar fasciitis 02/01/2014  . Visual floaters 04/04/2013  . Subjective vision disturbance, left 04/04/2013  . 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 MELANOMA, HX OF 06/01/2009  . NECK PAIN 09/11/2008  . CELLULITIS AND ABSCESS OF LEG EXCEPT FOOT 04/26/2008  . Acute sinusitis 01/06/2008  . ERECTILE DYSFUNCTION 10/12/2007  . HYPERLIPIDEMIA 04/12/2007  . Anxiety state 04/12/2007  . Essential hypertension 04/12/2007  . GERD 04/12/2007     Blythe Stanford, PT DPT 08/28/2016, 10:01 AM  [place] MAIN [facility] SERVICES [address] [place], [place], [address] Phone: [phone]   Fax:  [phone]  Name: [name] MRN: [id number] Date of Birth: 03-Jul-1951

## 2016-09-10 ENCOUNTER — Ambulatory Visit: Payer: Medicare Other

## 2016-09-10 DIAGNOSIS — M542 Cervicalgia: Secondary | ICD-10-CM

## 2016-09-10 DIAGNOSIS — R293 Abnormal posture: Secondary | ICD-10-CM

## 2016-09-10 NOTE — Therapy (Signed)
[place] MAIN [facility] SERVICES [address] [place], [place], [address] Phone: [phone]   Fax:  [phone]  Physical Therapy Treatment/ Discharge Summary  Patient Details  Name: [name] MRN: [id number] Date of Birth: December 08, 1950 Referring Provider: Sarajane Jews  Encounter Date: 09/10/2016   Pt has attended 15 out of 15 sessions from 05/06/16 to 09/10/16 and has completed all long term goals. Patient is to be discharged from physical therapy.      PT End of Session - 09/10/16 1311    Visit Number 15   Number of Visits 23   Date for PT Re-Evaluation 08/29/16   Authorization Time Period 10/10   PT Start Time 1300   PT Stop Time 1345   PT Time Calculation (min) 45 min   Activity Tolerance Patient tolerated treatment well   Behavior During Therapy WFL for tasks assessed/performed      Past Medical History:  Diagnosis Date  . Anxiety   . ED (erectile dysfunction)   . GERD (gastroesophageal reflux disease)   . Hyperlipidemia   . Hypertension   . Kidney stone 07-29-11   passed   . Vitreous floater    left eye, sees Dr. Dawna Part at [facility]     Past Surgical History:  Procedure Laterality Date  . BASAL CELL CARCINOMA EXCISION     removed from scalp  . COLONOSCOPY  07-09-07   per Dr. Olevia Perches, clear, repeat in 10 yrs   . HERNIA REPAIR      There were no vitals filed for this visit.      Subjective Assessment - 09/10/16 1307    Subjective Patient reports no current pain in the neck or shoulders. Reports the pain bothers him rarely and is much improved since the start of therapy.   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 1 hr   How long can you stand comfortably? unlimited   Diagnostic tests Xray   Patient Stated Goals reduce pain, reduce chance of reoccurance    Currently in Pain? No/denies        Observation: NDI: 14% TREATMENT  Therapeutic  Exercise: Seated ball roll outs in sitting - x20 with 5 sec hold with green physioball Scapular retraction with GTB- x30  Scapular High row with GTB - x30 Shoulder flexion with forearm on physioball - x 20 Cervical retraction in sitting - x 20  B shoulder ER in standing with GTB - x 30   *patient required frequent cueing on scapular and neck positioning throughout entirety of treatment session.      PT Education - 09/10/16 1310    Education provided Yes   Education Details HEP: retractions, shoulder ER/IR, Straight arm pull down   Person(s) Educated Patient   Methods Demonstration;Explanation;Handout   Comprehension Verbalized understanding;Returned demonstration             PT Long Term Goals - 09/10/16 1312      PT LONG TERM GOAL #1   Title pt will improve cervical rotation by 10 deg with pain <3/10 each way to aid with driving    Baseline 67/01/41: Full cerv ROM with 2/10 pain   Time 6   Period Weeks   Status Achieved     PT LONG TERM GOAL #2   Title pt will demonstrate proper sitting /standing posture, needing < 3 cues to correct during PT session    Baseline 07/04/16: Requires frequent cueing on scapular  retraction activation during exercise 10/09/16: Requires no cues for exercise performance   Time 6   Period Weeks   Status Achieved     PT LONG TERM GOAL #3   Title pt will be independent and compliant with HEP   Baseline 07/04/16: Requires minimal cueing for proper muscle activation    Time 6   Period Weeks   Status Achieved     PT LONG TERM GOAL #4   Title pt will be able to sit and work x 4 hours without increased neck pain    Baseline 07/04/16: Pt is able to sit at work at 2 hours 2016/10/09: Patient is able to sit at work for 4 hours   Time 6   Period Weeks   Status Achieved     PT LONG TERM GOAL #5   Title NDI will improve by 10% to indicate decrease in affect of pain on pt's daily activities   Baseline 12% 07/04/16: 14% October 09, 2016: 14%   Time 6    Period Weeks   Status Achieved               Plan - 09-Oct-2016 1331    Clinical Impression Statement Patient continues to demonstrate no pain with exercise indicating functional carryover between visits. Patient has met all long term goals and continues to have decrease pain and is to be discharged from physical therapy.    Rehab Potential Good   Clinical Impairments Affecting Rehab Potential lives alone, job requirements need to sit on low surface prlonged periods    PT Frequency 1x / week   PT Duration 6 weeks   PT Treatment/Interventions ADLs/Self Care Home Management;Cryotherapy;Electrical Stimulation;Traction;Moist Heat;Therapeutic exercise;Patient/family education;Manual techniques;Dry needling   PT Next Visit Plan Thoracic mobilizations, scalene/upper trap stretches, first rib mobilizations, DNF strengthening, cervical MWM   PT Home Exercise Plan Pec stretch, cervical retractions, straight arm rows, upper trap/scalene stretch, shoulder rolls, posture correction, prayer stretch, supine DNF strengthening   Consulted and Agree with Plan of Care Patient      Patient will benefit from skilled therapeutic intervention in order to improve the following deficits and impairments:  Postural dysfunction, Increased muscle spasms, Decreased activity tolerance, Decreased range of motion, Decreased strength, Hypomobility, Impaired flexibility  Visit Diagnosis: Abnormal posture  Cervicalgia       G-Codes - October 09, 2016 1336    Functional Assessment Tool Used NDI, ROM, clinical judgement   Functional Limitation Changing and maintaining body position   Changing and Maintaining Body Position Current Status (L4562) At least 1 percent but less than 20 percent impaired, limited or restricted   Changing and Maintaining Body Position Goal Status (B6389) At least 1 percent but less than 20 percent impaired, limited or restricted   Changing and Maintaining Body Position Discharge Status (H7342) At  least 1 percent but less than 20 percent impaired, limited or restricted      Problem List Patient Active Problem List   Diagnosis Date Noted  . Bilateral leg edema 06/05/2015  . Chronic neck pain 06/05/2015  . Plantar fasciitis 02/01/2014  . Visual floaters 04/04/2013  . Subjective vision disturbance, left 04/04/2013  . 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 MELANOMA, HX OF 06/01/2009  . NECK PAIN 09/11/2008  . CELLULITIS AND ABSCESS OF LEG EXCEPT FOOT 04/26/2008  . Acute sinusitis 01/06/2008  . ERECTILE DYSFUNCTION 10/12/2007  . HYPERLIPIDEMIA 04/12/2007  . Anxiety state 04/12/2007  . Essential hypertension 04/12/2007  . GERD 04/12/2007    Blythe Stanford, PT DPT 10/09/2016,  1:40 PM  [place] MAIN [facility] SERVICES [address] [place], [place], [address] Phone: [phone]   Fax:  [phone]  Name: Desiree Fleming MRN: [id number] Date of Birth: 04-06-1951

## 2016-09-11 ENCOUNTER — Telehealth: Payer: Self-pay | Admitting: Family Medicine

## 2016-09-11 NOTE — Telephone Encounter (Signed)
Call in #120 with 5 rf 

## 2016-09-11 NOTE — Telephone Encounter (Signed)
Pt needs refill on lorazepam [facility] [place]

## 2016-09-12 ENCOUNTER — Other Ambulatory Visit: Payer: Self-pay | Admitting: Family Medicine

## 2016-09-12 NOTE — Telephone Encounter (Signed)
Pt is requesting refill for Lorazepam 1mg . Please Advise.    [facility]

## 2016-09-12 NOTE — Telephone Encounter (Signed)
Per Dr. Sarajane Jews, okay to give # 120 with 5 refills.

## 2016-10-31 ENCOUNTER — Encounter: Payer: Self-pay | Admitting: Family Medicine

## 2016-10-31 ENCOUNTER — Ambulatory Visit (INDEPENDENT_AMBULATORY_CARE_PROVIDER_SITE_OTHER): Payer: Medicare Other | Admitting: Family Medicine

## 2016-10-31 VITALS — BP 153/95 | HR 83 | Temp 98.7°F | Ht 73.0 in | Wt 197.0 lb

## 2016-10-31 DIAGNOSIS — J209 Acute bronchitis, unspecified: Secondary | ICD-10-CM

## 2016-10-31 MED ORDER — AZITHROMYCIN 250 MG PO TABS: ORAL_TABLET | ORAL | 0 refills | Status: DC

## 2016-10-31 MED ORDER — HYDROCODONE-HOMATROPINE 5-1.5 MG/5ML PO SYRP: 5.0000 mL | ORAL_SOLUTION | ORAL | 0 refills | Status: DC | PRN

## 2016-10-31 NOTE — Progress Notes (Signed)
Pre visit review using our clinic review tool, if applicable. No additional management support is needed unless otherwise documented below in the visit note. 

## 2016-10-31 NOTE — Progress Notes (Signed)
   Subjective:    Patient ID: [name], male    DOB: 24-Feb-1951, 66 y.o.   MRN: [id number]  HPI Here for 4 days of fever, body aches, and headache, now for 2 days he also has chest congestion and a dry cough. Drinking fluids.    Review of Systems  Constitutional: Positive for chills, diaphoresis and fever.  HENT: Negative.   Eyes: Negative.   Respiratory: Positive for cough and chest tightness. Negative for shortness of breath and wheezing.        Objective:   Physical Exam  Constitutional:  Appears ill, hoarse   HENT:  Right Ear: External ear normal.  Left Ear: External ear normal.  Nose: Nose normal.  Mouth/Throat: Oropharynx is clear and moist.  Eyes: Conjunctivae are normal.  Neck: Neck supple. No thyromegaly present.  Pulmonary/Chest: Effort normal. No respiratory distress. He has no wheezes. He has no rales.  Scattered rhonchi   Lymphadenopathy:    He has no cervical adenopathy.          Assessment & Plan:  Bronchitis, probably as a result of influenza. Treat with a Zpack. [name] Penna, MD

## 2016-12-16 ENCOUNTER — Ambulatory Visit (INDEPENDENT_AMBULATORY_CARE_PROVIDER_SITE_OTHER): Payer: Medicare Other | Admitting: Family Medicine

## 2016-12-16 ENCOUNTER — Encounter: Payer: Self-pay | Admitting: Family Medicine

## 2016-12-16 VITALS — BP 158/98 | HR 83 | Temp 98.1°F | Ht 73.0 in | Wt 196.0 lb

## 2016-12-16 DIAGNOSIS — M791 Myalgia, unspecified site: Secondary | ICD-10-CM

## 2016-12-16 LAB — HEPATIC FUNCTION PANEL
ALK PHOS: 39 U/L (ref 39–117)
ALT: 19 U/L (ref 0–53)
AST: 18 U/L (ref 0–37)
Albumin: 4.3 g/dL (ref 3.5–5.2)
BILIRUBIN DIRECT: 0.1 mg/dL (ref 0.0–0.3)
BILIRUBIN TOTAL: 0.4 mg/dL (ref 0.2–1.2)
Total Protein: 6.9 g/dL (ref 6.0–8.3)

## 2016-12-16 LAB — CBC WITH DIFFERENTIAL/PLATELET
BASOS PCT: 0.8 % (ref 0.0–3.0)
Basophils Absolute: 0 10*3/uL (ref 0.0–0.1)
EOS PCT: 1 % (ref 0.0–5.0)
Eosinophils Absolute: 0.1 10*3/uL (ref 0.0–0.7)
HEMATOCRIT: 42.1 % (ref 39.0–52.0)
HEMOGLOBIN: 14.3 g/dL (ref 13.0–17.0)
LYMPHS PCT: 24.1 % (ref 12.0–46.0)
Lymphs Abs: 1.5 10*3/uL (ref 0.7–4.0)
MCHC: 33.9 g/dL (ref 30.0–36.0)
MCV: 87.8 fl (ref 78.0–100.0)
MONOS PCT: 9.5 % (ref 3.0–12.0)
Monocytes Absolute: 0.6 10*3/uL (ref 0.1–1.0)
Neutro Abs: 4 10*3/uL (ref 1.4–7.7)
Neutrophils Relative %: 64.6 % (ref 43.0–77.0)
Platelets: 206 10*3/uL (ref 150.0–400.0)
RBC: 4.8 Mil/uL (ref 4.22–5.81)
RDW: 13.7 % (ref 11.5–15.5)
WBC: 6.2 10*3/uL (ref 4.0–10.5)

## 2016-12-16 LAB — BASIC METABOLIC PANEL
BUN: 18 mg/dL (ref 6–23)
CALCIUM: 9.2 mg/dL (ref 8.4–10.5)
CHLORIDE: 103 meq/L (ref 96–112)
CO2: 27 meq/L (ref 19–32)
CREATININE: 1.03 mg/dL (ref 0.40–1.50)
GFR: 76.73 mL/min (ref >60.00)
Glucose, Bld: 89 mg/dL (ref 70–99)
POTASSIUM: 4.3 meq/L (ref 3.5–5.1)
SODIUM: 138 meq/L (ref 135–145)

## 2016-12-16 LAB — TSH: TSH: 1.53 u[IU]/mL (ref 0.35–4.50)

## 2016-12-16 LAB — T3, FREE: T3, Free: 3.5 pg/mL (ref 2.3–4.2)

## 2016-12-16 LAB — MAGNESIUM: Magnesium: 2 mg/dL (ref 1.5–2.5)

## 2016-12-16 LAB — T4, FREE: FREE T4: 0.89 ng/dL (ref 0.60–1.60)

## 2016-12-16 LAB — CK: Total CK: 169 U/L (ref 7–232)

## 2016-12-16 MED ORDER — IBUPROFEN 800 MG PO TABS: ORAL_TABLET | ORAL | 5 refills | Status: DC

## 2016-12-16 NOTE — Patient Instructions (Signed)
WE NOW OFFER   [facility] FAST TRACK!!!  SAME DAY Appointments for ACUTE CARE  Such as: Sprains, Injuries, cuts, abrasions, rashes, muscle pain, joint pain, back pain Colds, flu, sore throats, headache, allergies, cough, fever  Ear pain, sinus and eye infections Abdominal pain, nausea, vomiting, diarrhea, upset stomach Animal/insect bites  3 Easy Ways to Schedule: Walk-In Scheduling Call in scheduling Mychart Sign-up: [email or web address]         

## 2016-12-16 NOTE — Progress Notes (Signed)
   Subjective:    Patient ID: [name], male    DOB: 17-Jan-1951, 66 y.o.   MRN: [id number]  HPI Here for several nights of intermittent aching pains in both legs. These are not cramps and his muscles do not contract. This never happens during the day. No recent medication changes. No swelling.    Review of Systems  Constitutional: Negative.   Respiratory: Negative.   Cardiovascular: Negative.   Gastrointestinal: Negative.   Musculoskeletal: Positive for myalgias.  Neurological: Negative.        Objective:   Physical Exam  Constitutional: He is oriented to person, place, and time. He appears well-developed and well-nourished. No distress.  Cardiovascular: Normal rate, regular rhythm, normal heart sounds and intact distal pulses.   Pulmonary/Chest: Effort normal and breath sounds normal.  Musculoskeletal: He exhibits no edema or tenderness.  The legs are normal on exam   Neurological: He is alert and oriented to person, place, and time.          Assessment & Plan:  Nocturnal leg pains. The etiology of these is not clear. Get labs today. He will drink plenty of water.  [name] Penna, MD

## 2016-12-16 NOTE — Progress Notes (Signed)
Pre visit review using our clinic review tool, if applicable. No additional management support is needed unless otherwise documented below in the visit note. 

## 2016-12-18 ENCOUNTER — Ambulatory Visit: Payer: Medicare Other | Admitting: Family Medicine

## 2016-12-30 ENCOUNTER — Other Ambulatory Visit: Payer: Self-pay | Admitting: Family Medicine

## 2017-02-05 ENCOUNTER — Telehealth: Payer: Self-pay

## 2017-02-05 NOTE — Telephone Encounter (Signed)
Call to Mr Taniguchi to schedule AWV after 6/16   LVM to call back

## 2017-02-08 ENCOUNTER — Other Ambulatory Visit: Payer: Self-pay | Admitting: Family Medicine

## 2017-02-09 NOTE — Telephone Encounter (Signed)
Last refill was 09/12/16 for #120 with 5 refills.  Patient was last seen 12/16/16. Is this ok to refill?

## 2017-02-09 NOTE — Telephone Encounter (Signed)
Rx has been called in as directed.  

## 2017-02-09 NOTE — Telephone Encounter (Signed)
Call in #120 with 5 rf 

## 2017-03-01 ENCOUNTER — Other Ambulatory Visit: Payer: Self-pay | Admitting: Family Medicine

## 2017-03-04 ENCOUNTER — Telehealth: Payer: Self-pay | Admitting: Family Medicine

## 2017-03-04 NOTE — Telephone Encounter (Signed)
Called pt and left a message - RN has a meeting and will not be back at one - told him to call us back so we can reschedule.

## 2017-03-09 ENCOUNTER — Ambulatory Visit: Payer: Medicare Other

## 2017-03-16 ENCOUNTER — Other Ambulatory Visit: Payer: Self-pay | Admitting: Family Medicine

## 2017-03-16 NOTE — Telephone Encounter (Signed)
Can we refill this? 

## 2017-03-17 NOTE — Telephone Encounter (Signed)
Ok to refill for 30 days  

## 2017-04-15 ENCOUNTER — Ambulatory Visit: Payer: Medicare Other

## 2017-04-16 ENCOUNTER — Other Ambulatory Visit: Payer: Self-pay | Admitting: Adult Health

## 2017-04-24 ENCOUNTER — Encounter: Payer: Self-pay | Admitting: Family Medicine

## 2017-04-24 ENCOUNTER — Ambulatory Visit (INDEPENDENT_AMBULATORY_CARE_PROVIDER_SITE_OTHER)
Admission: RE | Admit: 2017-04-24 | Discharge: 2017-04-24 | Disposition: A | Discharge status: Home / Self Care | Payer: Medicare Other | Source: Ambulatory Visit | Attending: Family Medicine | Admitting: Family Medicine

## 2017-04-24 ENCOUNTER — Encounter: Payer: Self-pay | Admitting: Gastroenterology

## 2017-04-24 ENCOUNTER — Ambulatory Visit (INDEPENDENT_AMBULATORY_CARE_PROVIDER_SITE_OTHER): Payer: Medicare Other | Admitting: Family Medicine

## 2017-04-24 VITALS — BP 152/90 | HR 80 | Temp 97.7°F | Ht 72.5 in | Wt 195.8 lb

## 2017-04-24 DIAGNOSIS — R200 Anesthesia of skin: Secondary | ICD-10-CM | POA: Diagnosis not present

## 2017-04-24 DIAGNOSIS — R202 Paresthesia of skin: Secondary | ICD-10-CM

## 2017-04-24 DIAGNOSIS — R600 Localized edema: Secondary | ICD-10-CM | POA: Diagnosis not present

## 2017-04-24 DIAGNOSIS — E782 Mixed hyperlipidemia: Secondary | ICD-10-CM | POA: Diagnosis not present

## 2017-04-24 DIAGNOSIS — I10 Essential (primary) hypertension: Secondary | ICD-10-CM

## 2017-04-24 DIAGNOSIS — F411 Generalized anxiety disorder: Secondary | ICD-10-CM

## 2017-04-24 DIAGNOSIS — Z86010 Personal history of colonic polyps: Secondary | ICD-10-CM | POA: Diagnosis not present

## 2017-04-24 DIAGNOSIS — R251 Tremor, unspecified: Secondary | ICD-10-CM

## 2017-04-24 DIAGNOSIS — N401 Enlarged prostate with lower urinary tract symptoms: Secondary | ICD-10-CM | POA: Diagnosis not present

## 2017-04-24 DIAGNOSIS — N138 Other obstructive and reflux uropathy: Secondary | ICD-10-CM

## 2017-04-24 DIAGNOSIS — R059 Cough, unspecified: Secondary | ICD-10-CM

## 2017-04-24 DIAGNOSIS — R05 Cough: Secondary | ICD-10-CM | POA: Diagnosis not present

## 2017-04-24 LAB — CBC WITH DIFFERENTIAL/PLATELET
BASOS PCT: 0.8 % (ref 0.0–3.0)
Basophils Absolute: 0 10*3/uL (ref 0.0–0.1)
EOS PCT: 1.2 % (ref 0.0–5.0)
Eosinophils Absolute: 0.1 10*3/uL (ref 0.0–0.7)
HEMATOCRIT: 43.4 % (ref 39.0–52.0)
Hemoglobin: 14.8 g/dL (ref 13.0–17.0)
LYMPHS ABS: 1.4 10*3/uL (ref 0.7–4.0)
Lymphocytes Relative: 25.9 % (ref 12.0–46.0)
MCHC: 34.2 g/dL (ref 30.0–36.0)
MCV: 88 fl (ref 78.0–100.0)
MONO ABS: 0.5 10*3/uL (ref 0.1–1.0)
Monocytes Relative: 9.4 % (ref 3.0–12.0)
NEUTROS ABS: 3.5 10*3/uL (ref 1.4–7.7)
NEUTROS PCT: 62.7 % (ref 43.0–77.0)
PLATELETS: 202 10*3/uL (ref 150.0–400.0)
RBC: 4.94 Mil/uL (ref 4.22–5.81)
RDW: 13.6 % (ref 11.5–15.5)
WBC: 5.6 10*3/uL (ref 4.0–10.5)

## 2017-04-24 LAB — LIPID PANEL
CHOL/HDL RATIO: 4
Cholesterol: 194 mg/dL (ref 0–200)
HDL: 45.6 mg/dL (ref >39.00)
LDL Cholesterol: 116 mg/dL — ABNORMAL HIGH (ref 0–99)
NONHDL: 148.21
Triglycerides: 162 mg/dL — ABNORMAL HIGH (ref 0.0–149.0)
VLDL: 32.4 mg/dL (ref 0.0–40.0)

## 2017-04-24 LAB — HEPATIC FUNCTION PANEL
ALBUMIN: 4.2 g/dL (ref 3.5–5.2)
ALK PHOS: 40 U/L (ref 39–117)
ALT: 16 U/L (ref 0–53)
AST: 16 U/L (ref 0–37)
BILIRUBIN DIRECT: 0.1 mg/dL (ref 0.0–0.3)
TOTAL PROTEIN: 6.7 g/dL (ref 6.0–8.3)
Total Bilirubin: 0.5 mg/dL (ref 0.2–1.2)

## 2017-04-24 LAB — BASIC METABOLIC PANEL
BUN: 16 mg/dL (ref 6–23)
CALCIUM: 9.1 mg/dL (ref 8.4–10.5)
CHLORIDE: 102 meq/L (ref 96–112)
CO2: 26 meq/L (ref 19–32)
CREATININE: 1.09 mg/dL (ref 0.40–1.50)
GFR: 71.8 mL/min (ref >60.00)
Glucose, Bld: 95 mg/dL (ref 70–99)
Potassium: 4.1 mEq/L (ref 3.5–5.1)
SODIUM: 137 meq/L (ref 135–145)

## 2017-04-24 LAB — POC URINALSYSI DIPSTICK (AUTOMATED)
Bilirubin, UA: NEGATIVE
GLUCOSE UA: NEGATIVE
KETONES UA: NEGATIVE
Leukocytes, UA: NEGATIVE
Nitrite, UA: NEGATIVE
Protein, UA: NEGATIVE
RBC UA: NEGATIVE
Urobilinogen, UA: 0.2 E.U./dL
pH, UA: 6 (ref 5.0–8.0)

## 2017-04-24 LAB — TSH: TSH: 1.65 u[IU]/mL (ref 0.35–4.50)

## 2017-04-24 LAB — PSA: PSA: 1.29 ng/mL (ref 0.10–4.00)

## 2017-04-24 MED ORDER — LOSARTAN POTASSIUM 50 MG PO TABS: 50.0000 mg | ORAL_TABLET | Freq: Every day | ORAL | 3 refills | Status: DC

## 2017-04-24 NOTE — Progress Notes (Signed)
   Subjective:    Patient ID: [name], male    DOB: 07/15/1951, 65 y.o.   MRN: [id number]  HPI Here to follow up several issues. He has been having more numbness in both lower legs and feet lately. 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 He has also developed an intentional tremor of the right hand. He has had a dry cough and he thinks this is a side effect of his Lisinopril. His BP has been borderline high at home around 150/90.    Review of Systems  Constitutional: Negative.   HENT: Negative.   Eyes: Negative.   Respiratory: Positive for cough. Negative for apnea, choking, chest tightness, shortness of breath, wheezing and stridor.   Cardiovascular: Negative.   Gastrointestinal: Negative.   Genitourinary: Negative.   Musculoskeletal: Negative.   Skin: Negative.   Neurological: Positive for tremors and numbness. Negative for dizziness, seizures, syncope, facial asymmetry, speech difficulty, weakness, light-headedness and headaches.  Psychiatric/Behavioral: Negative.        Objective:   Physical Exam  Constitutional: He is oriented to person, place, and time. He appears well-developed and well-nourished. No distress.  HENT:  Head: Normocephalic and atraumatic.  Right Ear: External ear normal.  Left Ear: External ear normal.  Nose: Nose normal.  Mouth/Throat: Oropharynx is clear and moist. No oropharyngeal exudate.  Eyes: Pupils are equal, round, and reactive to light. Conjunctivae and EOM are normal. Right eye exhibits no discharge. Left eye exhibits no discharge. No scleral icterus.  Neck: Neck supple. No JVD present. No tracheal deviation present. No thyromegaly present.  Cardiovascular: Normal rate, regular rhythm, normal heart sounds and intact distal pulses.  Exam reveals no gallop and no friction rub.   No murmur heard. Pulmonary/Chest: Effort normal and breath sounds normal. No respiratory distress. He has  no wheezes. He has no rales. He exhibits no tenderness.  Abdominal: Soft. Bowel sounds are normal. He exhibits no distension and no mass. There is no tenderness. There is no rebound and no guarding.  Genitourinary: Penis normal. No penile tenderness.  Musculoskeletal: Normal range of motion. He exhibits no edema or tenderness.  Lymphadenopathy:    He has no cervical adenopathy.  Neurological: He is alert and oriented to person, place, and time. He has normal reflexes. No cranial nerve deficit. He exhibits normal muscle tone. Coordination normal.  Skin: Skin is warm and dry. No rash noted. He is not diaphoretic. No erythema. No pallor.  Psychiatric: He has a normal mood and affect. His behavior is normal. Judgment and thought content normal.          Assessment & Plan:  His HTN is not well controlled so we will stop the Lisinopril and switch to Losartan 50 mg daily. His cough may in fact be from the Lisinopril, and if this is the case the cough should resolve. We will set up a CXR soon since he has a hx of melanoma. His anxiety is stable. He has leg numbness and a tremor so we will arrange for him to see Neurology. Set up another colonoscopy.  [name] Penna, MD

## 2017-04-27 ENCOUNTER — Encounter: Payer: Self-pay | Admitting: Neurology

## 2017-04-29 ENCOUNTER — Ambulatory Visit (INDEPENDENT_AMBULATORY_CARE_PROVIDER_SITE_OTHER): Payer: Medicare Other

## 2017-04-29 VITALS — BP 134/84 | Ht 73.0 in | Wt 192.0 lb

## 2017-04-29 DIAGNOSIS — Z0000 Encounter for general adult medical examination without abnormal findings: Secondary | ICD-10-CM

## 2017-04-29 NOTE — Progress Notes (Addendum)
Subjective:   [name] is a 66 y.o. male who presents for an Initial Medicare Annual Wellness Visit.  The Patient was informed that the wellness visit is to identify future health risk and educate and initiate measures that can reduce risk for increased disease through the lifespan.    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  Preventive Screening -Counseling & Management  Medicare Annual Preventive Care Visit - Subsequent Last OV 04/24/2017 Numbness in both lower legs; inc'd fall risk  Dc Lisinopril and started Losartan Losartan managing bP  Seeing [place] neurologist for numbness In toes No related falls  Colonoscopy -due 10/18 and GI has outreached Seeing Dr. Enis Gash 06/2017 PSA completed in August 2018 is wnl Eye exam in May 2018 Dr. Edison Pace at [facility] as poor, fair, good or great? good  VS reviewed; BP down  ETOH seldom No smoking  Diet  Triglycerides 162; educated on lipids; Will cut back on white foods and will look for HFCS in the bars he eats. . Diet overall is healthy;   BMI 25  Exercise HDL 45 Will try to start walking more Has access to a gym at his living complex but does not go   Advanced Directives completed  Patient Care Team: [name], MD as PCP - General Assessed for additional providers  Immunization History  Administered Date(s) Administered  . Influenza Split 08/13/2011, 06/08/2012, 06/22/2013  . Influenza Whole 06/22/2005, 06/19/2010  . Influenza-Unspecified 07/14/2015, 07/12/2016  . Pneumococcal Conjugate-13 08/11/2013  . Pneumococcal Polysaccharide-23 04/23/2016  . Td 06/19/2010  . Tdap 09/12/2013  . Zoster 08/11/2013   Required Immunizations needed today  Screening test up to date or reviewed for plan of  completion Health Maintenance Due  Topic Date Due  . INFLUENZA VACCINE  04/22/2017   Will take when available  Cardiac Risk Factors include: advanced age (>48men, >15 women);dyslipidemia;hypertension    Objective:    Today's Vitals   04/29/17 2059  BP: 134/84  Weight: 192 lb (87.1 kg)  Height: 6\' 1"  (1.854 m)   Body mass index is 25.33 kg/m.  Current Medications (verified) Outpatient Encounter Prescriptions as of 04/29/2017  Medication Sig  . cyclobenzaprine (FLEXERIL) 10 MG tablet TAKE ONE TABLET BY MOUTH AT BEDTIME  . DULoxetine (CYMBALTA) 20 MG capsule TAKE 1 CAPSULE BY MOUTH EVERY DAY  . finasteride ([name]) 5 MG tablet Take 1 tablet (5 mg total) by mouth daily.  Marland Kitchen ibuprofen (ADVIL,MOTRIN) 800 MG tablet TAKE 1 TABLET (800 MG TOTAL) BY MOUTH EVERY 6 (SIX) HOURS AS NEEDED FOR MODERATE PAIN.  Marland Kitchen LORazepam (ATIVAN) 1 MG tablet TAKE 1 TABLET BY MOUTH EVERY 6 HOURS AS NEEDED  . losartan (COZAAR) 50 MG tablet Take 1 tablet (50 mg total) by mouth daily.  Marland Kitchen omeprazole (PRILOSEC) 20 MG capsule TAKE 1 CAPSULE (20 MG TOTAL) BY MOUTH 2 (TWO) TIMES DAILY BEFORE A MEAL.   No facility-administered encounter medications on file as of 04/29/2017.     Allergies (verified) Codeine   History: Past Medical History:  Diagnosis Date  . Anxiety   . ED (erectile dysfunction)   . GERD (gastroesophageal reflux disease)   . Hyperlipidemia   . Hypertension   . Kidney stone 07-29-11   passed   . Vitreous floater    left eye, sees  [facility] Dawna Part at [facility]    Past Surgical History:  Procedure Laterality Date  . BASAL CELL CARCINOMA EXCISION     removed from scalp  . COLONOSCOPY  07-09-07   per Dr. Olevia Perches, clear, repeat in 10 yrs   . HERNIA REPAIR     Family History  Problem Relation Age of Onset  . Hypertension Mother   . Hyperlipidemia Mother   . Depression Unknown   . Diabetes Unknown   . Hyperlipidemia Unknown   . Hypertension Unknown   . Alzheimer's disease Unknown     Social History   Occupational History  . Not on file.   Social History Main Topics  . Smoking status: Never Smoker  . Smokeless tobacco: Never Used  . Alcohol use No  . Drug use: No  . Sexual activity: Not on file   Tobacco Counseling Counseling given: Not Answered   Activities of Daily Living In your present state of health, do you have any difficulty performing the following activities: 04/29/2017  Hearing? N  Vision? N  Difficulty concentrating or making decisions? N  Walking or climbing stairs? N  Dressing or bathing? N  Doing errands, shopping? N  Preparing Food and eating ? N  Using the Toilet? N  In the past six months, have you accidently leaked urine? N  Do you have problems with loss of bowel control? N  Managing your Medications? N  Managing your Finances? N  Housekeeping or managing your Housekeeping? N  Some recent data might be hidden    Immunizations and Health Maintenance Immunization History  Administered Date(s) Administered  . Influenza Split 08/13/2011, 06/08/2012, 06/22/2013  . Influenza Whole 06/22/2005, 06/19/2010  . Influenza-Unspecified 07/14/2015, 07/12/2016  . Pneumococcal Conjugate-13 08/11/2013  . Pneumococcal Polysaccharide-23 04/23/2016  . Td 06/19/2010  . Tdap 09/12/2013  . Zoster 08/11/2013   Health Maintenance Due  Topic Date Due  . INFLUENZA VACCINE  04/22/2017    Patient Care Team: [name], MD as PCP - General  Indicate any recent Medical Services you may have received from other than [facility] providers in the past year (date may be approximate).    Assessment:   This is a routine wellness examination for [name].   Hearing/Vision screen Hearing Screening Comments: Good no issues Vision Screening Comments: Per dr. Edison Pace [facility] No issues and annual visits  Dietary issues and exercise activities discussed:    Goals    . patient          Continue to focus on physical activity    . Reduce sugar  intake to X grams per day          Monitor for HFCS in ketchup; salad dressing, health bars etc  cut back on pasta       Depression Screen PHQ 2/9 Scores 04/29/2017 04/24/2017 03/07/2016  PHQ - 2 Score 0 1 0    Fall Risk Fall Risk  04/29/2017 04/24/2017 03/07/2016  Falls in the past year? No No No    Cognitive Function: MMSE - Mini Mental State Exam 04/29/2017 03/07/2016  Not completed: (No Data) -  Orientation to time - 5  Orientation to Place - 5  Registration - 3  Attention/ Calculation - 5  Recall - 3  Language- name 2 objects - 2  Language- repeat - 1  Language- follow 3 step command - 3  Language- read & follow direction - 1  Write a sentence - 1  Copy design - 1  Total score - 30        Screening Tests Health Maintenance  Topic Date Due  . INFLUENZA VACCINE  04/22/2017  . COLONOSCOPY  07/08/2017 (Originally 09/09/2000)  . TETANUS/TDAP  09/13/2023  . Hepatitis C Screening  Completed  . PNA vac Low Risk Adult  Completed        Plan:     Will discuss prevnar with Dr. Sarajane Jews Had one dose around 66 yo Has had PSV 23 p 64  To see neurology for numbness if toes  Overall, doing well.  Will try to start exercising  Educated regarding food choices, chol, shingrix     I have personally reviewed and noted the following in the patient's chart:   . Medical and social history . Use of alcohol, tobacco or illicit drugs  . Current medications and supplements . Functional ability and status . Nutritional status . Physical activity . Advanced directives . List of other physicians . Hospitalizations, surgeries, and ER visits in previous 12 months . Vitals . Screenings to include cognitive, depression, and falls . Referrals and appointments  In addition, I have reviewed and discussed with patient certain preventive protocols, quality metrics, and best practice recommendations. A written personalized care plan for preventive services as well as general preventive  health recommendations were provided to patient.     IPPGF,QMKJI, RN   04/29/2017   I have reviewed this note and agree with its contents.  [name] Penna, MD

## 2017-04-29 NOTE — Patient Instructions (Addendum)
[name] , Thank you for taking time to come for your Medicare Wellness Visit. I appreciate your ongoing commitment to your health goals. Please review the following plan we discussed and let me know if I can assist you in the future.   Check the breakfast bars and other foods you may be eating for "High fructose corn syrup" which can elevate your sugar levels and therefore, triglycerides  You can discuss prevnar with Dr. Sarajane Jews as you had one at 66 yo  Shingrix is a vaccine for the prevention of Shingles in Adults 66 and older.  If you are on Medicare, you can request a prescription from your doctor to be filled at a pharmacy.  Please check with your benefits regarding applicable copays or out of pocket expenses.  The Shingrix is given in 2 vaccines approx 8 weeks apart. You must receive the 2nd dose prior to 6 months from receipt of the first.    These are the goals we discussed: Goals    . patient          Continue to focus on physical activity       This is a list of the screening recommended for you and due dates:  Health Maintenance  Topic Date Due  . Flu Shot  04/22/2017  . Colon Cancer Screening  07/08/2017*  . Tetanus Vaccine  09/13/2023  .  Hepatitis C: One time screening is recommended by Center for Disease Control  (CDC) for  adults born from [birth year] through 1966.   Completed  . Pneumonia vaccines  Completed  *Topic was postponed. The date shown is not the original due date.   Prevention of falls: Remove rugs or any tripping hazards in the home Use Non slip mats in bathtubs and showers Placing grab bars next to the toilet and or shower Placing handrails on both sides of the stair way Adding extra lighting in the home.   Personal safety issues reviewed:  1. Consider starting a community watch program per [facility] 2.  Changes batteries is smoke detector and/or carbon monoxide detector  3.  If you have firearms; keep them in a safe place 4.  Wear  protection when in the sun; Always wear sunscreen or a hat; It is good to have your doctor check your skin annually or review any new areas of concern 5. Driving safety; Keep in the right lane; stay 3 car lengths behind the car in front of you on the highway; look 3 times prior to pulling out; carry your cell phone everywhere you go!    Learn about the Yellow Dot program:  The program allows first responders at your emergency to have access to who your physician is, as well as your medications and medical conditions.  Citizens requesting the Yellow Dot Packages should contact Master Corporal Nunzio Cobbs at the [facility] [phone] for the first week of the program and beginning the week after Easter citizens should contact their Scientist, physiological.  Summary: Preventive Care for Adults (just FYI) you are doing all of this is applicable to you!)  A healthy lifestyle and preventive care can promote health and wellness. Preventive health guidelines for adults include the following key practices.  . A routine yearly physical is a good way to check with your health care provider about your health and preventive screening. It is a chance to share any concerns and updates on your health and to receive a thorough exam.  .  Visit your dentist for a routine exam and preventive care every 6 months. Brush your teeth twice a day and floss once a day. Good oral hygiene prevents tooth decay and gum disease.  . The frequency of eye exams is based on your age, health, family medical history, use  of contact lenses, and other factors. Follow your health care provider's ecommendations for frequency of eye exams.  . Eat a healthy diet. Foods like vegetables, fruits, whole grains, low-fat dairy products, and lean protein foods contain the nutrients you need without too many calories. Decrease your intake of foods high in solid fats, added sugars, and salt. Eat the right amount  of calories for you. Get information about a proper diet from your health care provider, if necessary.  . Regular physical exercise is one of the most important things you can do for your health. Most adults should get at least 150 minutes of moderate-intensity exercise (any activity that increases your heart rate and causes you to sweat) each week. In addition, most adults need muscle-strengthening exercises on 2 or more days a week.  Silver Sneakers may be a benefit available to you. To determine eligibility, you may visit the website: www.silversneakers.com or contact program at [phone] Mon-Fri between 8AM-8PM.   . Maintain a healthy weight. The body mass index (BMI) is a screening tool to identify possible weight problems. It provides an estimate of body fat based on height and weight. Your health care provider can find your BMI and can help you achieve or maintain a healthy weight.   For adults 20 years and older: ? A BMI below 18.5 is considered underweight. ? A BMI of 18.5 to 24.9 is normal. ? A BMI of 27 to 28 is considered normal by the Institutes of Health  ? A BMI of 30 and above is considered obese.   . Maintain normal blood lipids and cholesterol levels by exercising and minimizing your intake of saturated fat. Eat a balanced diet with plenty of fruit and vegetables. Blood tests for lipids and cholesterol should begin at age 69 and be repeated every 5 years. If your lipid or cholesterol levels are high, you are over 50, or you are at high risk for heart disease, you may need your cholesterol levels checked more frequently. Ongoing high lipid and cholesterol levels should be treated with medicines if diet and exercise are not working.  . If you smoke, find out from your health care provider how to quit. If you do not use tobacco, please do not start.  . If you choose to drink alcohol, please do not consume more than one drink for women and 2 for men.  One drink is  considered to be 12 ounces (355 mL) of beer, 5 ounces (148 mL) of wine, or 1.5 ounces (44 mL) of liquor. Moderation of alcohol intake to this level decreases your risk of breast cancer and liver damage.   . If you are 68-6 years old, ask your health care provider if you should take aspirin to prevent strokes.  . Use sunscreen. Apply sunscreen liberally and repeatedly throughout the day. You should seek shade when your shadow is shorter than you. Protect yourself by wearing long sleeves, pants, a wide-brimmed hat, and sunglasses year round, whenever you are outdoors.  . Once a month, do a whole body skin exam, using a mirror to look at the skin on your back. Tell your health care provider of new moles, moles that have irregular borders, moles  that are larger than a pencil eraser, or moles that have changed in shape or color.  Last, if you have completed an Advanced Directive; please bring a copy and review with your physician and then we will scan to the medical record    Falls can be the main reason people lose their independence Think before you CLIMB  4 things you can do to prevent falls 1. Begin an exercise program to improve your leg strength and balance 2. Ask the doctor to review medicines for fall risk 3. Get an annual eye check up and update your eye glasses;  (the [employer] still assist with eyewear:  Reviewed for annual vision exam;The [facility] assistance for eyewear is coordinated through [facility]; Please call Cherlyn Labella at [phone] 4. Make your home safer by: Removing clutter and tripping hazzards Putting railing on stairs and adding grab bars to the bathroom  Have good lighting; especially on stairs and at night when getting up to the bathroom   Exercise; including walking can assist with maintaining tone and balance and help prevent falls as you age.

## 2017-05-03 ENCOUNTER — Other Ambulatory Visit: Payer: Self-pay | Admitting: Family Medicine

## 2017-06-11 ENCOUNTER — Encounter: Payer: Self-pay | Admitting: Family Medicine

## 2017-06-22 ENCOUNTER — Ambulatory Visit (AMBULATORY_SURGERY_CENTER): Payer: Self-pay | Admitting: *Deleted

## 2017-06-22 VITALS — Ht 72.0 in | Wt 195.4 lb

## 2017-06-22 DIAGNOSIS — Z1211 Encounter for screening for malignant neoplasm of colon: Secondary | ICD-10-CM

## 2017-06-22 MED ORDER — NA SULFATE-K SULFATE-MG SULF 17.5-3.13-1.6 GM/177ML PO SOLN: ORAL | 0 refills | Status: DC

## 2017-06-22 NOTE — Progress Notes (Signed)
No allergies to eggs or soy. No problems with anesthesia.  Pt given Emmi instructions for colonoscopy  No oxygen use  No diet drug use  

## 2017-06-23 ENCOUNTER — Encounter: Payer: Self-pay | Admitting: Gastroenterology

## 2017-07-03 ENCOUNTER — Ambulatory Visit (AMBULATORY_SURGERY_CENTER): Payer: Medicare Other | Admitting: Gastroenterology

## 2017-07-03 ENCOUNTER — Encounter: Payer: Self-pay | Admitting: Gastroenterology

## 2017-07-03 VITALS — BP 150/91 | HR 77 | Temp 98.6°F | Resp 15 | Ht 72.0 in | Wt 195.0 lb

## 2017-07-03 DIAGNOSIS — D120 Benign neoplasm of cecum: Secondary | ICD-10-CM | POA: Diagnosis not present

## 2017-07-03 DIAGNOSIS — Z1211 Encounter for screening for malignant neoplasm of colon: Secondary | ICD-10-CM | POA: Diagnosis present

## 2017-07-03 HISTORY — PX: COLONOSCOPY: SHX174

## 2017-07-03 MED ORDER — SODIUM CHLORIDE 0.9 % IV SOLN: 500.0000 mL | INTRAVENOUS | Status: DC

## 2017-07-03 NOTE — Progress Notes (Signed)
To PACU, VSS. Report to [email or web address] 

## 2017-07-03 NOTE — Progress Notes (Signed)
Called to room to assist during endoscopic procedure.  Patient ID and intended procedure confirmed with present staff. Received instructions for my participation in the procedure from the performing physician.  

## 2017-07-03 NOTE — Patient Instructions (Signed)
Impression/recommendations:  Polyp (handout given) Diverticulosis (handout given) Hemorrhoids (handout given)  YOU HAD AN ENDOSCOPIC PROCEDURE TODAY AT [facility]:   Refer to the procedure report that was given to you for any specific questions about what was found during the examination.  If the procedure report does not answer your questions, please call your gastroenterologist to clarify.  If you requested that your care partner not be given the details of your procedure findings, then the procedure report has been included in a sealed envelope for you to review at your convenience later.  YOU SHOULD EXPECT: Some feelings of bloating in the abdomen. Passage of more gas than usual.  Walking can help get rid of the air that was put into your GI tract during the procedure and reduce the bloating. If you had a lower endoscopy (such as a colonoscopy or flexible sigmoidoscopy) you may notice spotting of blood in your stool or on the toilet paper. If you underwent a bowel prep for your procedure, you may not have a normal bowel movement for a few days.  Please Note:  You might notice some irritation and congestion in your nose or some drainage.  This is from the oxygen used during your procedure.  There is no need for concern and it should clear up in a day or so.  SYMPTOMS TO REPORT IMMEDIATELY:   Following lower endoscopy (colonoscopy or flexible sigmoidoscopy):  Excessive amounts of blood in the stool  Significant tenderness or worsening of abdominal pains  Swelling of the abdomen that is new, acute  Fever of 100F or higher  For urgent or emergent issues, a gastroenterologist can be reached at any hour by calling [phone].   DIET:  We do recommend a small meal at first, but then you may proceed to your regular diet.  Drink plenty of fluids but you should avoid alcoholic beverages for 24 hours.  ACTIVITY:  You should plan to take it easy for the rest of today and you  should NOT DRIVE or use heavy machinery until tomorrow (because of the sedation medicines used during the test).    FOLLOW UP: Our staff will call the number listed on your records the next business day following your procedure to check on you and address any questions or concerns that you may have regarding the information given to you following your procedure. If we do not reach you, we will leave a message.  However, if you are feeling well and you are not experiencing any problems, there is no need to return our call.  We will assume that you have returned to your regular daily activities without incident.  If any biopsies were taken you will be contacted by phone or by letter within the next 1-3 weeks.  Please call us at [phone] if you have not heard about the biopsies in 3 weeks.    SIGNATURES/CONFIDENTIALITY: You and/or your care partner have signed paperwork which will be entered into your electronic medical record.  These signatures attest to the fact that that the information above on your After Visit Summary has been reviewed and is understood.  Full responsibility of the confidentiality of this discharge information lies with you and/or your care-partner.

## 2017-07-03 NOTE — Op Note (Signed)
[facility] Patient Name: [name] Procedure Date: 07/03/2017 10:26 AM MRN: [id number] Endoscopist: Remo Lipps P. Harveen Flesch MD, MD Age: 66 Referring MD:  Date of Birth: [birth year] Gender: Male Account #: [id number] Procedure:                Colonoscopy Indications:              Screening for colorectal malignant neoplasm Medicines:                Monitored Anesthesia Care Procedure:                Pre-Anesthesia Assessment:                           - Prior to the procedure, a History and Physical                            was performed, and patient medications and                            allergies were reviewed. The patient's tolerance of                            previous anesthesia was also reviewed. The risks                            and benefits of the procedure and the sedation                            options and risks were discussed with the patient.                            All questions were answered, and informed consent                            was obtained. Prior Anticoagulants: The patient has                            taken no previous anticoagulant or antiplatelet                            agents. ASA Grade Assessment: II - A patient with                            mild systemic disease. After reviewing the risks                            and benefits, the patient was deemed in                            satisfactory condition to undergo the procedure.                           After obtaining informed consent, the colonoscope  was passed under direct vision. Throughout the                            procedure, the patient's blood pressure, pulse, and                            oxygen saturations were monitored continuously. The                            Colonoscope was introduced through the anus and                            advanced to the the cecum, identified by                            appendiceal  orifice and ileocecal valve. The                            colonoscopy was performed without difficulty. The                            patient tolerated the procedure well. The quality                            of the bowel preparation was adequate. The                            ileocecal valve, appendiceal orifice, and rectum                            were photographed. Scope In: 10:35:24 AM Scope Out: 10:55:03 AM Scope Withdrawal Time: 0 hours 14 minutes 4 seconds  Total Procedure Duration: 0 hours 19 minutes 39 seconds  Findings:                 The perianal and digital rectal examinations were                            normal.                           A 5 mm polyp was found in the cecum. The polyp was                            sessile. The polyp was removed with a cold snare.                            Resection and retrieval were complete.                           Scattered medium-mouthed diverticula were found in                            the sigmoid colon.  Internal hemorrhoids were found during retroflexion.                           The exam was otherwise without abnormality. Complications:            No immediate complications. Estimated blood loss:                            Minimal. Estimated Blood Loss:     Estimated blood loss was minimal. Impression:               - One 5 mm polyp in the cecum, removed with a cold                            snare. Resected and retrieved.                           - Diverticulosis in the sigmoid colon.                           - Internal hemorrhoids.                           - The examination was otherwise normal. Recommendation:           - Patient has a contact number available for                            emergencies. The signs and symptoms of potential                            delayed complications were discussed with the                            patient. Return to normal activities tomorrow.                             Written discharge instructions were provided to the                            patient.                           - Resume previous diet.                           - Continue present medications.                           - Await pathology results.                           - Repeat colonoscopy is recommended for                            surveillance. The colonoscopy date will be  determined after pathology results from today's                            exam become available for review.                           - No ibuprofen, naproxen, or other non-steroidal                            anti-inflammatory drugs for 2 weeks after polyp                            removal. Remo Lipps P. Davelyn Gwinn MD, MD 07/03/2017 10:58:09 AM This report has been signed electronically.

## 2017-07-06 ENCOUNTER — Telehealth: Payer: Self-pay

## 2017-07-06 NOTE — Telephone Encounter (Signed)
  Follow up Call-  Call back number 07/03/2017  Post procedure Call Back phone  # [phone]  Permission to leave phone message Yes  Some recent data might be hidden     Patient questions:  Do you have a fever, pain , or abdominal swelling? No. Pain Score  0 *  Have you tolerated food without any problems? Yes.    Have you been able to return to your normal activities? Yes.    Do you have any questions about your discharge instructions: Diet   No. Medications  No. Follow up visit  No.  Do you have questions or concerns about your Care? No.  Actions: * If pain score is 4 or above: No action needed, pain <4.

## 2017-07-07 ENCOUNTER — Encounter: Payer: Self-pay | Admitting: Gastroenterology

## 2017-07-31 ENCOUNTER — Ambulatory Visit: Payer: Medicare Other | Admitting: Neurology

## 2017-08-03 ENCOUNTER — Ambulatory Visit: Payer: Medicare Other | Admitting: Family Medicine

## 2017-08-04 ENCOUNTER — Other Ambulatory Visit: Payer: Self-pay | Admitting: Family Medicine

## 2017-08-04 NOTE — Telephone Encounter (Signed)
Sent to PCP for approval.  

## 2017-08-05 NOTE — Telephone Encounter (Signed)
Copied from [place] [phone]. Topic: General - Other >> Aug 05, 2017 10:14 AM Sai Moura Stare wrote: Reason for CRM   pt req refill of the following med   cyclobenzaprine (FLEXERIL) 10 MG tablet   [place]

## 2017-08-25 ENCOUNTER — Other Ambulatory Visit: Payer: Self-pay | Admitting: Family Medicine

## 2017-08-25 NOTE — Telephone Encounter (Signed)
Sent to PCP for approval.  

## 2017-08-25 NOTE — Telephone Encounter (Signed)
Call in #120 with 5 rf 

## 2017-08-28 ENCOUNTER — Other Ambulatory Visit: Payer: Self-pay | Admitting: Family Medicine

## 2017-10-09 ENCOUNTER — Telehealth: Payer: Self-pay | Admitting: Family Medicine

## 2017-10-09 NOTE — Telephone Encounter (Signed)
Copied from [place] [phone]. Topic: General - Other >> Oct 09, 2017  8:33 AM Lolita Rieger, RMA wrote: Reason for CRM: Pt would like to know if he needs an appointment for health exam paperwork? Pt has appt for CPE scheduled for 10/19/17 but needs this filled out before his appointment. [phone] is the best number to reach pt

## 2017-10-09 NOTE — Telephone Encounter (Signed)
Called pt and left a VM to call back.  

## 2017-10-12 ENCOUNTER — Encounter: Payer: Self-pay | Admitting: Family Medicine

## 2017-10-12 ENCOUNTER — Ambulatory Visit: Payer: Medicare Other | Admitting: Family Medicine

## 2017-10-12 VITALS — BP 140/80 | HR 85 | Temp 97.5°F | Ht 71.0 in | Wt 198.8 lb

## 2017-10-12 DIAGNOSIS — F411 Generalized anxiety disorder: Secondary | ICD-10-CM | POA: Diagnosis not present

## 2017-10-12 DIAGNOSIS — I10 Essential (primary) hypertension: Secondary | ICD-10-CM | POA: Diagnosis not present

## 2017-10-12 NOTE — Telephone Encounter (Signed)
Pt has an OV today. Pt stated that he does want a CPE bc his lasy CPE was in 04/2017 so if he had one now it would be too soon and insurance will not cover.

## 2017-10-12 NOTE — Progress Notes (Signed)
   Subjective:    Patient ID: [name], male    DOB: 12-10-1950, 67 y.o.   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 He feels fine. He had a wellnness exam here last August.    Review of Systems  Constitutional: Negative.   Respiratory: Negative.   Cardiovascular: Negative.   Neurological: Negative.        Objective:   Physical Exam  Constitutional: He is oriented to person, place, and time. He appears well-developed and well-nourished.  Cardiovascular: Normal rate, regular rhythm, normal heart sounds and intact distal pulses.  Pulmonary/Chest: Effort normal and breath sounds normal. No respiratory distress. He has no wheezes. He has no rales.  Neurological: He is alert and oriented to person, place, and time.          Assessment & Plan:  He is doing well. No concerns about communicable disease. HTN is stable.  [name] Penna, MD

## 2017-10-19 ENCOUNTER — Encounter: Payer: Medicare Other | Admitting: Family Medicine

## 2017-10-23 ENCOUNTER — Encounter: Payer: Self-pay | Admitting: Family Medicine

## 2017-10-23 ENCOUNTER — Ambulatory Visit: Payer: Medicare Other | Admitting: Family Medicine

## 2017-10-23 VITALS — BP 132/80 | HR 74 | Temp 98.2°F | Wt 197.4 lb

## 2017-10-23 DIAGNOSIS — J209 Acute bronchitis, unspecified: Secondary | ICD-10-CM

## 2017-10-23 MED ORDER — AZITHROMYCIN 250 MG PO TABS: ORAL_TABLET | ORAL | 0 refills | Status: DC

## 2017-10-23 NOTE — Progress Notes (Signed)
   Subjective:    Patient ID: Hung Rhinesmith, male    DOB: 26-Feb-1951, 67 y.o.   MRN: [id number]  HPI Here for 5 days of PND, chest tightness and coughing up green sputum. No fever.    Review of Systems  Constitutional: Negative.   HENT: Positive for congestion and postnasal drip. Negative for sinus pressure, sinus pain and sore throat.   Eyes: Negative.   Respiratory: Positive for cough and chest tightness.        Objective:   Physical Exam  Constitutional: He appears well-developed and well-nourished.  HENT:  Right Ear: External ear normal.  Left Ear: External ear normal.  Nose: Nose normal.  Mouth/Throat: Oropharynx is clear and moist.  Eyes: Conjunctivae are normal.  Neck: No thyromegaly present.  Pulmonary/Chest: Effort normal and breath sounds normal. No respiratory distress. He has no wheezes. He has no rales.  Lymphadenopathy:    He has no cervical adenopathy.          Assessment & Plan:  Bronchitis, treat with a Zpack. [name] Penna, MD

## 2017-10-28 ENCOUNTER — Ambulatory Visit: Payer: Medicare Other | Admitting: Family Medicine

## 2017-10-28 ENCOUNTER — Encounter: Payer: Self-pay | Admitting: Family Medicine

## 2017-10-28 ENCOUNTER — Ambulatory Visit: Payer: Medicare Other | Admitting: Neurology

## 2017-10-28 VITALS — BP 142/100 | HR 79 | Temp 98.2°F | Wt 198.0 lb

## 2017-10-28 DIAGNOSIS — R220 Localized swelling, mass and lump, head: Secondary | ICD-10-CM | POA: Diagnosis not present

## 2017-10-28 NOTE — Progress Notes (Signed)
   Subjective:    Patient ID: [name], male    DOB: [birth year], 67 y.o.   MRN: [id number]  HPI Here for the sudden onset last night of swelling in the left cheek and some mild pain. This then went down for awhile until this morning. Then in mid morning it swelled up again, and then it again got better and now it feels back to normal. No ST or cough. Ibuprofen seemed to help. He just finished a Zpack for bronchitis, and this resolved nicely.    Review of Systems  Constitutional: Negative.   HENT: Positive for facial swelling. Negative for congestion, ear pain, postnasal drip, sinus pressure, sinus pain, sore throat and trouble swallowing.   Eyes: Negative.   Respiratory: Negative.        Objective:   Physical Exam  Constitutional: He appears well-developed and well-nourished.  HENT:  Right Ear: External ear normal.  Left Ear: External ear normal.  Nose: Nose normal.  Mouth/Throat: Oropharynx is clear and moist. No oropharyngeal exudate.  No swelling of the face, no tenderness or warmth   Eyes: Conjunctivae are normal.  Neck: No thyromegaly present.  Pulmonary/Chest: Effort normal and breath sounds normal. No respiratory distress. He has no wheezes. He has no rales.  Lymphadenopathy:    He has no cervical adenopathy.          Assessment & Plan:  This was likely the result of a parotid gland duct stone. This would explain the intermittent symptoms. It seems to have resolved. If the swelling returns he can suck on lemon drops in the mouth, take Ibuprofen, and apply hot compresses to the cheek.  [name] Penna, MD

## 2017-12-23 ENCOUNTER — Telehealth: Payer: Self-pay

## 2017-12-23 NOTE — Telephone Encounter (Signed)
error 

## 2017-12-24 ENCOUNTER — Other Ambulatory Visit: Payer: Self-pay | Admitting: Family Medicine

## 2017-12-24 NOTE — Telephone Encounter (Signed)
Last OV 10/28/2017   Last refilled 12/16/2016 disp 60 with 5 refills   Sent to PCP for approval

## 2018-02-01 ENCOUNTER — Other Ambulatory Visit: Payer: Self-pay

## 2018-02-01 ENCOUNTER — Ambulatory Visit: Payer: Medicare Other | Admitting: Family Medicine

## 2018-02-01 ENCOUNTER — Encounter: Payer: Self-pay | Admitting: Family Medicine

## 2018-02-01 VITALS — BP 160/100 | HR 92 | Temp 97.9°F | Wt 195.0 lb

## 2018-02-01 DIAGNOSIS — R251 Tremor, unspecified: Secondary | ICD-10-CM | POA: Diagnosis not present

## 2018-02-01 DIAGNOSIS — J019 Acute sinusitis, unspecified: Secondary | ICD-10-CM

## 2018-02-01 MED ORDER — AZITHROMYCIN 250 MG PO TABS: ORAL_TABLET | ORAL | 0 refills | Status: DC

## 2018-02-01 NOTE — Progress Notes (Signed)
   Subjective:    Patient ID: Avid Guillette, male    DOB: 07-23-1951, 67 y.o.   MRN: [id number]  HPI Here for one week of sinus pressure, PND, and coughing up yellow sputum. On Mucinex.   Review of Systems  Constitutional: Negative.   HENT: Positive for congestion, sinus pressure and sinus pain. Negative for sore throat.   Eyes: Negative.   Respiratory: Positive for cough.        Objective:   Physical Exam  Constitutional: He appears well-developed and well-nourished.  HENT:  Right Ear: External ear normal.  Left Ear: External ear normal.  Nose: Nose normal.  Mouth/Throat: Oropharynx is clear and moist.  Eyes: Conjunctivae are normal.  Neck: No thyromegaly present.  Pulmonary/Chest: Effort normal and breath sounds normal. No stridor. No respiratory distress. He has no wheezes. He has no rales.  Lymphadenopathy:    He has no cervical adenopathy.          Assessment & Plan:  Sinusitis, treat with a Zapck.  [name] Penna, MD

## 2018-02-02 ENCOUNTER — Encounter: Payer: Self-pay | Admitting: Neurology

## 2018-02-17 NOTE — Progress Notes (Signed)
[name] was seen today in the movement disorders clinic for neurologic consultation at the request of [name], MD.  The consultation is for the evaluation of R hand tremor.  The records that were made available to me were reviewed but no records regarding tremor specifically are noted.    Tremor: Yes.     How long has it been going on? It is intermittent.   Started 5-6 years  At rest or with activation?  Both, but more when using the hand.  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  Eating; fine motor coordination  Fam hx of tremor?  Yes.   - distant relatives in great aunt  Located where?  R hand  Affected by caffeine:  No.  Affected by alcohol:  Doesn't drink enough to know  Affected by stress:  Yes.    Spills soup if on spoon:  No.  Spills glass of liquid if full:  No.  Affects ADL's (tying shoes, brushing teeth, etc):  No.  Tremor inducing meds:  No.  Other Specific Symptoms:  Voice: "yes", but states that he thinks that it is purposeful.   Sleep: gets up several times to use the bathroom  Vivid Dreams:  No.  Acting out dreams:  No. Wet Pillows: No., not from drooling but has some night sweats Postural symptoms:  No.  Falls?  No. Bradykinesia symptoms: no bradykinesia noted Loss of smell:  No. Loss of taste:  No. Urinary Incontinence:  No. but has frequency due to BPH.   Difficulty Swallowing:  Yes.  , wonders if taking too big of bites Handwriting, micrographia: No., gotten a little bigger.  Handwriting  Trouble with ADL's:  No.  Trouble buttoning clothing: No. Depression:  Yes.  , but usually in winter months when not sunny Memory changes:  Yes.   - worries about this since mom had dementia.  Has also read that his meds can affect memory.  He doesn't think out of proportion to age Hallucinations:  No.  visual distortions: No. N/V:  No. Lightheaded:  No.  Syncope: No. Diplopia:   No. Dyskinesia:  No.  Neuroimaging of the brain has not previously been performed.    PREVIOUS MEDICATIONS: none to date  ALLERGIES:   Allergies  Allergen Reactions  . Codeine Other (See Comments)    hallucinations    CURRENT MEDICATIONS:  Outpatient Encounter Medications as of 02/19/2018  Medication Sig  . cyclobenzaprine (FLEXERIL) 10 MG tablet TAKE 1 TABLET BY MOUTH AT BEDTIME  . DULoxetine (CYMBALTA) 20 MG capsule TAKE 1 CAPSULE BY MOUTH EVERY DAY  . finasteride ([name]) 5 MG tablet Take 1 tablet (5 mg total) by mouth daily.  Marland Kitchen ibuprofen (ADVIL,MOTRIN) 800 MG tablet TAKE 1 TABLET (800 MG TOTAL) BY MOUTH EVERY 6 (SIX) HOURS AS NEEDED FOR MODERATE PAIN.  Marland Kitchen LORazepam (ATIVAN) 1 MG tablet TAKE 1 TABLET BY MOUTH EVERY 6 HOURS AS NEEDED  . losartan (COZAAR) 50 MG tablet Take 1 tablet (50 mg total) by mouth daily.  . Melatonin 10 MG TABS Take by mouth as needed.  Marland Kitchen omeprazole (PRILOSEC) 20 MG capsule TAKE 1 CAPSULE (20 MG TOTAL) BY MOUTH 2 (TWO) TIMES DAILY BEFORE A MEAL.  Marland Kitchen triamcinolone cream (KENALOG) 0.1 % Apply 1 application topically 2 (two) times daily as needed.  . [DISCONTINUED] azithromycin (ZITHROMAX) 250 MG tablet As directed  . [DISCONTINUED] tadalafil (CIALIS)  20 MG tablet Take 20 mg by mouth daily as needed.     No facility-administered encounter medications on file as of 02/19/2018.     PAST MEDICAL HISTORY:   Past Medical History:  Diagnosis Date  . Anxiety   . Arthritis   . Cancer ([place]) 2010   basal cell ca, head  . Cataract    bilateral  . Depression   . ED (erectile dysfunction)   . GERD (gastroesophageal reflux disease)   . Hyperlipidemia   . Hypertension   . Kidney stone 07-29-11   passed   . 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 Vitreous floater    left eye, sees Dr. Dawna Part at [facility]:   Past Surgical History:  Procedure Laterality Date  . BASAL CELL CARCINOMA EXCISION     removed from scalp   . COLONOSCOPY  07-09-07   per Dr. Olevia Perches, clear, repeat in 10 yrs   . HERNIA REPAIR      SOCIAL HISTORY:   Social History   Socioeconomic History  . Marital status: Single    Spouse name: Not on file  . Number of children: Not on file  . Years of education: Not on file  . Highest education level: Not on file  Occupational History  . Occupation: retired    Comment: Pharmacist, hospital  Social Needs  . Financial resource strain: Not on file  . Food insecurity:    Worry: Not on file    Inability: Not on file  . Transportation needs:    Medical: Not on file    Non-medical: Not on file  Tobacco Use  . Smoking status: Never Smoker  . Smokeless tobacco: Never Used  Substance and Sexual Activity  . Alcohol use: No    Alcohol/week: 0.0 oz  . Drug use: No  . Sexual activity: Not on file  Lifestyle  . Physical activity:    Days per week: Not on file    Minutes per session: Not on file  . Stress: Not on file  Relationships  . Social connections:    Talks on phone: Not on file    Gets together: Not on file    Attends religious service: Not on file    Active member of club or organization: Not on file    Attends meetings of clubs or organizations: Not on file    Relationship status: Not on file  . Intimate partner violence:    Fear of current or ex partner: Not on file    Emotionally abused: Not on file    Physically abused: Not on file    Forced sexual activity: Not on file  Other Topics Concern  . Not on file  Social History Narrative  . Not on file    FAMILY HISTORY:   Family Status  Relation Name Status  . Mother  Deceased  . Father  Deceased  . Sister  Alive  . MGM  Deceased  . MGF  Deceased  . PGM  Deceased  . PGF  Deceased  . Unknown  (Not Specified)  . Neg Hx  (Not Specified)    ROS:  Having some R shoulder pain and wonders if that is related to the tremor.  A complete 10 system review of systems was obtained and was unremarkable apart from what is mentioned  above.  PHYSICAL EXAMINATION:    VITALS:   Vitals:   02/19/18 0938  BP: Marland Kitchen)  146/98  Pulse: 90  SpO2: 98%  Weight: 193 lb (87.5 kg)  Height: 6\' 1"  (1.854 m)    GEN:  The patient appears stated age and is in NAD. HEENT:  Normocephalic, atraumatic.  The mucous membranes are moist. The superficial temporal arteries are without ropiness or tenderness. CV:  RRR Lungs:  CTAB Neck/HEME:  There are no carotid bruits bilaterally.  Neurological examination:  Orientation: The patient is alert and oriented x3. Fund of knowledge is appropriate.  Recent and remote memory are intact.  Attention and concentration are normal.    Able to name objects and repeat phrases. Cranial nerves: There is good facial symmetry. Pupils are equal round and reactive to light bilaterally. Fundoscopic exam reveals clear margins bilaterally. Extraocular muscles are intact. The visual fields are full to confrontational testing. The speech is fluent and clear. Soft palate rises symmetrically and there is no tongue deviation. Hearing is intact to conversational tone. Sensation: Sensation is intact to light and pinprick throughout (facial, trunk, extremities). Vibration is intact at the bilateral big toe. There is no extinction with double simultaneous stimulation. There is no sensory dermatomal level identified. Motor: Strength is 5/5 in the bilateral upper and lower extremities.   Shoulder shrug is equal and symmetric.  There is no pronator drift. Deep tendon reflexes: Deep tendon reflexes are 2-/4 at the bilateral biceps, triceps, brachioradialis, patella and achilles. Plantar responses are downgoing bilaterally.  Movement examination: Tone: There is normal tone in the bilateral upper extremities.  The tone in the lower extremities is normal.  Abnormal movements: there is mild intermittent head tremor in the yes direction.  There is thumb tremor on both hands that is worse with distraction.  There is little postural tremor  and slight intention tremor.  he has no difficulty with archimedes spirals.  he has no difficulty when asked to pour a full glass of water from one glass to another. Coordination:  There is no decremation with RAM's, with any form of RAMS, including alternating supination and pronation of the forearm, hand opening and closing, finger taps, heel taps and toe taps. Gait and Station: The patient has no difficulty arising out of a deep-seated chair without the use of the hands. The patient's stride length is normal with good arm swing.    Labs:    Chemistry      Component Value Date/Time   NA 137 04/24/2017 0846   K 4.1 04/24/2017 0846   CL 102 04/24/2017 0846   CO2 26 04/24/2017 0846   BUN 16 04/24/2017 0846   CREATININE 1.09 04/24/2017 0846      Component Value Date/Time   CALCIUM 9.1 04/24/2017 0846   ALKPHOS 40 04/24/2017 0846   AST 16 04/24/2017 0846   ALT 16 04/24/2017 0846   BILITOT 0.5 04/24/2017 0846     Lab Results  Component Value Date   TSH 1.65 04/24/2017   Lab Results  Component Value Date   WBC 5.6 04/24/2017   HGB 14.8 04/24/2017   HCT 43.4 04/24/2017   MCV 88.0 04/24/2017   PLT 202.0 04/24/2017     ASSESSMENT/PLAN:  1.  Tremor  -he has features of rest and intention tremor, but both are very mild.  I reassured him that I saw no evidence of a neurodegenerative disorder such as Parkinson's disease.  I also told him that does not mean it could not develop in the future, but he certainly does not have that today.  Tremor is quite mild and  while we discussed medications, I did not recommend them for him given mild nature and he was okay with that.  I did recommend following him yearly to make sure he does not develop a neurodegenerative process.  2.  Right shoulder pain  -Patient asked if that was related to the tremor.  I told him that it was not, especially given that he also has tremor in the left thumb.  I told him this sounded like it could be a rotator cuff  issue and he should follow-up with his primary care physician in regards to this.  3.  F/U one year  Cc:  [name], MD

## 2018-02-18 ENCOUNTER — Ambulatory Visit: Payer: Medicare Other | Admitting: Neurology

## 2018-02-19 ENCOUNTER — Ambulatory Visit: Payer: Medicare Other | Admitting: Neurology

## 2018-02-19 ENCOUNTER — Encounter: Payer: Self-pay | Admitting: Neurology

## 2018-02-19 VITALS — BP 146/98 | HR 90 | Ht 73.0 in | Wt 193.0 lb

## 2018-02-19 DIAGNOSIS — R251 Tremor, unspecified: Secondary | ICD-10-CM | POA: Diagnosis not present

## 2018-02-19 DIAGNOSIS — M25511 Pain in right shoulder: Secondary | ICD-10-CM | POA: Diagnosis not present

## 2018-02-23 ENCOUNTER — Other Ambulatory Visit: Payer: Self-pay | Admitting: Family Medicine

## 2018-02-23 NOTE — Telephone Encounter (Signed)
Last OV 02/19/2018   Last refilled 08/27/2017 disp 120 with 5 refills   Sent to PCP to advise

## 2018-02-23 NOTE — Telephone Encounter (Signed)
Call in #120 with 5 rf 

## 2018-03-26 ENCOUNTER — Other Ambulatory Visit: Payer: Self-pay | Admitting: Family Medicine

## 2018-03-30 NOTE — Telephone Encounter (Signed)
Last OV 02/01/2018

## 2018-04-05 ENCOUNTER — Other Ambulatory Visit: Payer: Self-pay | Admitting: Family Medicine

## 2018-04-30 ENCOUNTER — Ambulatory Visit: Payer: Medicare Other

## 2018-04-30 NOTE — Progress Notes (Addendum)
Subjective:   [name] is a 67 y.o. male who presents for Medicare Annual/Subsequent preventive examination.  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 . Diet overall is healthy;  Trying to watch what he eats  He has switched to all wheat bread   BMI 25  Exercise HDL 45 Will try to start walking more Has access to a gym at his living complex but does not go Walks every day x 15 to 20 minutes   May choose another plan if he does not go back to work this fall  Has a weight room   Health Maintenance Due  Topic Date Due  . INFLUENZA VACCINE  04/22/2018    Colonoscopy -due 10/18 and GI has outreached Found one polyp so now is due in 5 years - repeat 06/2022   Seeing Dr. Enis Gash 06/2017 next due 04/2018  Following PSA   Dr Patsy [place]   Eye exam in May 2018 Dr. Edison Pace at [facility]      Hearing Screening   125Hz  250Hz  500Hz  1000Hz  2000Hz  3000Hz  4000Hz  6000Hz  8000Hz   Right ear:     100      Left ear:     100      Comments: Hearing is ok Does not do well if everyone is talking   Probably 67 yo since he had hearing checked Would recommend a hearing screen     Vision Screening Comments: Had eye exam x 1 month ago Changed bifocal strength No issues Cataracts are small now  [facility]       Objective:    Vitals: BP (!) 150/84   Pulse 83   Ht 6\' 1"  (1.854 m)   Wt 194 lb (88 kg)   SpO2 96%   BMI 25.60 kg/m   Body mass index is 25.6 kg/m.  Advanced Directives 05/03/2018 07/03/2017 06/22/2017 04/29/2017 04/21/2016 03/07/2016  Does Patient Have a Medical Advance Directive? Yes No  Yes Yes Yes Yes  Type of Advance Directive - - Living will;Healthcare Power of Attorney - - -  [place] in Chart? - - - - No - copy requested -   Has completed AD HCPOA   Tobacco Social History   Tobacco Use  Smoking Status Never Smoker  Smokeless Tobacco Never Used     Counseling given: Yes   Clinical Intake:   Past Medical History:  Diagnosis Date  . Anxiety   . Arthritis   . Cancer ([place]) 2010   basal cell ca, head  . Cataract    bilateral  . Depression   . ED (erectile dysfunction)   . GERD (gastroesophageal reflux disease)   . Hyperlipidemia   . Hypertension   . Kidney stone 07-29-11   passed   . 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 Vitreous floater    left eye, sees [facility]. Dawn[facility] at [facility]    Past Surgical History:  Procedure Laterality Date  . BASAL CELL CARCINOMA EXCISION     removed  from scalp  . COLONOSCOPY  07-09-07   per Dr. Olevia Perches, clear, repeat in 10 yrs   . HERNIA REPAIR     Family History  Problem Relation Age of Onset  . Hypertension Mother   . Hyperlipidemia Mother   . Depression Unknown   . Diabetes Unknown   . Hyperlipidemia Unknown   . Hypertension Unknown   . Alzheimer's disease Unknown   . Colon cancer Neg Hx    Social History   Socioeconomic History  . Marital status: Single    Spouse name: Not on file  . Number of children: Not on file  . Years of education: Not on file  . Highest education level: Not on file  Occupational History  . Occupation: retired    Comment: Pharmacist, hospital  Social Needs  . Financial resource strain: Not on file  . Food insecurity:    Worry: Not on file    Inability: Not on file  . Transportation needs:    Medical: Not on file    Non-medical: Not on file  Tobacco Use  . Smoking status: Never Smoker  . Smokeless tobacco: Never Used  Substance and Sexual Activity  . Alcohol use: No    Alcohol/week: 0.0 standard drinks  . Drug use: No  . Sexual  activity: Not on file  Lifestyle  . Physical activity:    Days per week: Not on file    Minutes per session: Not on file  . Stress: Not on file  Relationships  . Social connections:    Talks on phone: Not on file    Gets together: Not on file    Attends religious service: Not on file    Active member of club or organization: Not on file    Attends meetings of clubs or organizations: Not on file    Relationship status: Not on file  Other Topics Concern  . Not on file  Social History Narrative  . Not on file    Outpatient Encounter Medications as of 05/03/2018  Medication Sig  . cyclobenzaprine (FLEXERIL) 10 MG tablet TAKE 1 TABLET BY MOUTH AT BEDTIME  . DULoxetine (CYMBALTA) 20 MG capsule TAKE 1 CAPSULE BY MOUTH EVERY DAY  . finasteride ([name]) 5 MG tablet Take 1 tablet (5 mg total) by mouth daily.  Marland Kitchen ibuprofen (ADVIL,MOTRIN) 800 MG tablet TAKE 1 TABLET (800 MG TOTAL) BY MOUTH EVERY 6 (SIX) HOURS AS NEEDED FOR MODERATE PAIN.  Marland Kitchen LORazepam (ATIVAN) 1 MG tablet TAKE 1 TABLET BY MOUTH EVERY 6 HOURS AS NEEDED  . losartan (COZAAR) 50 MG tablet TAKE 1 TABLET BY MOUTH EVERY DAY  . Melatonin 10 MG TABS Take by mouth as needed.  Marland Kitchen omeprazole (PRILOSEC) 20 MG capsule TAKE 1 CAPSULE (20 MG TOTAL) BY MOUTH 2 (TWO) TIMES DAILY BEFORE A MEAL.  Marland Kitchen triamcinolone cream (KENALOG) 0.1 % Apply 1 application topically 2 (two) times daily as needed.  . [DISCONTINUED] DULoxetine (CYMBALTA) 20 MG capsule TAKE 1 CAPSULE BY MOUTH EVERY DAY  . [DISCONTINUED] tadalafil (CIALIS) 20 MG tablet Take 20 mg by mouth daily as needed.     No facility-administered encounter medications on file as of 05/03/2018.     Activities of Daily Living In your present state of health, do you have any difficulty performing the following activities: 05/03/2018  Hearing? N  Vision? N  Difficulty concentrating or making decisions? N  Comment checked at home by nurse; recall is 3/3 and clock test was fine  Walking or climbing  stairs? N  Dressing or bathing? N  Doing errands, shopping? N  Preparing Food and eating ? N  Using the Toilet? N  In the past six months, have you accidently leaked urine? N  Comment seeing a UR  Do you have problems with loss of bowel control? N  Managing your Medications? N  Managing your Finances? N  Housekeeping or managing your Housekeeping? N  Some recent data might be hidden    Patient Care Team: [name], MD as PCP - General   Assessment:   This is a routine wellness examination for [name].  Exercise Activities and Dietary recommendations Current Exercise Habits: Home exercise routine, Type of exercise: walking, Time (Minutes): 20, Frequency (Times/Week): 5, Weekly Exercise (Minutes/Week): 100, Intensity: Mild  Goals    . patient     Continue to focus on physical activity    . Patient Stated     Engaging with new people, meeting new people  Try to find a barn that will let you ride  [place] - They have weekend camping and daytime [place] horse  Therapeutic horse farm  Likes classic movies and can check this out Continue to walking   Develop other interest     . Reduce sugar intake to X grams per day     Monitor for HFCS in ketchup; salad dressing, health bars etc  cut back on pasta        Fall Risk Fall Risk  05/03/2018 02/19/2018 04/29/2017 04/24/2017 03/07/2016  Falls in the past year? No No No No No     Depression Screen PHQ 2/9 Scores 05/03/2018 04/29/2017 04/24/2017 03/07/2016  PHQ - 2 Score 0 0 1 0    Cognitive Function MMSE - Mini Mental State Exam 05/03/2018 04/29/2017 03/07/2016  Not completed: (No Data) (No Data) -  Orientation to time - - 5  Orientation to Place - - 5  Registration - - 3  Attention/ Calculation - - 5  Recall - - 3  Language- name 2 objects - - 2  Language- repeat - - 1  Language- follow 3 step command - - 3  Language- read & follow direction - - 1  Write a sentence - - 1  Copy design - - 1  Total score - - 30      Ad8 score reviewed for issues:  Issues making decisions:  Less interest in hobbies / activities:  Repeats questions, stories (family complaining):  Trouble using ordinary gadgets (microwave, computer, phone):  Forgets the month or year:   Mismanaging finances:   Remembering appts:  Daily problems with thinking and/or memory: Ad8 score is=0        Immunization History  Administered Date(s) Administered  . Influenza Split 08/13/2011, 06/08/2012, 06/22/2013  . Influenza Whole 06/22/2005, 06/19/2010  . Influenza-Unspecified 07/14/2015, 07/12/2016, 07/05/2017  . Pneumococcal Conjugate-13 08/11/2013  . Pneumococcal Polysaccharide-23 04/23/2016  . Td 06/19/2010  . Tdap 09/12/2013  . Zoster 08/11/2013     Screening Tests Health Maintenance  Topic Date Due  . INFLUENZA VACCINE  04/22/2018  . COLONOSCOPY  07/03/2022  . TETANUS/TDAP  09/13/2023  . Hepatitis C Screening  Completed  . PNA vac Low Risk Adult  Completed         Plan:      PCP Notes   Health Maintenance Colonoscopy -due 10/18 and GI has outreached Found one polyp so now is due in 5 years - repeat 06/2022   Seeing Dr. Enis Gash 06/2017 next due 04/2018  Following PSA   Educated regarding the shingrix     Abnormal Screens  Hearing 2000hz  in both ears   Referrals none   Patient concerns; Went to Dr. Carles Collet in May for tremors; thought to be benign Now has shoulder pain on the right which is intermittent. States does not originate from his neck. Dr. Carles Collet referred back to Dr. Sarajane Jews to evaluate.  Was in PT for shoulder pain last year at [facility] but stated this helped  BP elevated 150 84 and will discuss with Dr. Sarajane Jews  08/17 States he is monitoring his sodium    Nurse Concerns; As noted   Next PCP apt 08/16     I have personally reviewed and noted the following in the patient's chart:   . Medical and social history . Use of alcohol, tobacco or illicit drugs  . Current medications and  supplements . Functional ability and status . Nutritional status . Physical activity . Advanced directives . List of other physicians . Hospitalizations, surgeries, and ER visits in previous 12 months . Vitals . Screenings to include cognitive, depression, and falls . Referrals and appointments  In addition, I have reviewed and discussed with patient certain preventive protocols, quality metrics, and best practice recommendations. A written personalized care plan for preventive services as well as general preventive health recommendations were provided to patient.     DVVOH,[email or web address], RN  05/03/2018  I have read this note and agree with its contents.  [name] Penna, MD

## 2018-05-03 ENCOUNTER — Other Ambulatory Visit: Payer: Self-pay | Admitting: Family Medicine

## 2018-05-03 ENCOUNTER — Ambulatory Visit (INDEPENDENT_AMBULATORY_CARE_PROVIDER_SITE_OTHER): Payer: Medicare Other

## 2018-05-03 VITALS — BP 150/84 | HR 83 | Ht 73.0 in | Wt 194.0 lb

## 2018-05-03 DIAGNOSIS — Z0000 Encounter for general adult medical examination without abnormal findings: Secondary | ICD-10-CM

## 2018-05-03 NOTE — Patient Instructions (Addendum)
[name] , Thank you for taking time to come for your Medicare Wellness Visit. I appreciate your ongoing commitment to your health goals. Please review the following plan we discussed and let me know if I can assist you in the future.   Shingrix is a vaccine for the prevention of Shingles in Adults 50 and older.  If you are on Medicare, the shingrix is covered under your Part D plan, so you will take both of the vaccines in the series at your pharmacy. Please check with your benefits regarding applicable copays or out of pocket expenses.  The Shingrix is given in 2 vaccines approx 8 weeks apart. You must receive the 2nd dose prior to 6 months from receipt of the first. Please have the pharmacist print out you Immunization  dates for our office records   Keep in mind the flu shot is an inactivated vaccine and takes at least 2 weeks to build immunity. The flu virus can be dormant for 4 days prior to symptoms Taking the flu shot at the beginning of the season can reduce the risk for the entire community.  Takes at the [place] can have a hearing screen anywhere.  [facility] - can assist with hearing aid x 1  No reviews  [employer] [facility]  [address]  [phone]  [email or web address].acquia-[email or web address]/default/files/[email or web address]_How_to_Buy_Hearing_Aids.pdf   These are the goals we discussed: Goals    . patient     Continue to focus on physical activity    . Patient Stated     Engaging with new people, meeting new people  Try to find a barn that will let you ride  [employer] - They have weekend camping and daytime [place] horse  Therapeutic horse farm  Likes classic movies and can check this out Continue to walking   Develop other interest     . Reduce sugar intake to X grams per day     Monitor for HFCS in ketchup; salad dressing, health bars etc  cut back on pasta        This is a list of the  screening recommended for you and due dates:  Health Maintenance  Topic Date Due  . Flu Shot  04/22/2018  . Colon Cancer Screening  07/03/2022  . Tetanus Vaccine  09/13/2023  .  Hepatitis C: One time screening is recommended by Center for Disease Control  (CDC) for  adults born from [birth year] through 1966.   Completed  . Pneumonia vaccines  Completed    DASH Eating Plan DASH stands for "Dietary Approaches to Stop Hypertension." The DASH eating plan is a healthy eating plan that has been shown to reduce high blood pressure (hypertension). It may also reduce your risk for type 2 diabetes, heart disease, and stroke. The DASH eating plan may also help with weight loss. What are tips for following this plan? General guidelines  Avoid eating more than 2,300 mg (milligrams) of salt (sodium) a day. If you have hypertension, you may need to reduce your sodium intake to 1,500 mg a day.  Limit alcohol intake to no more than 1 drink a day for nonpregnant women and 2 drinks a day for men. One drink equals 12 oz of beer, 5 oz of wine, or 1 oz of hard liquor.  Work with your health care provider to maintain a healthy body weight or to lose weight. Ask what an ideal weight is for you.  Get at least 30 minutes of exercise that causes your heart to beat faster (aerobic exercise) most days of the week. Activities may include walking, swimming, or biking.  Work with your health care provider or diet and nutrition specialist (dietitian) to adjust your eating plan to your individual calorie needs. Reading food labels  Check food labels for the amount of sodium per serving. Choose foods with less than 5 percent of the Daily Value of sodium. Generally, foods with less than 300 mg of sodium per serving fit into this eating plan.  To find whole grains, look for the word "whole" as the first word in the ingredient list. Shopping  Buy products labeled as "low-sodium" or "no salt added."  Buy fresh foods. Avoid  canned foods and premade or frozen meals. Cooking  Avoid adding salt when cooking. Use salt-free seasonings or herbs instead of table salt or sea salt. Check with your health care provider or pharmacist before using salt substitutes.  Do not fry foods. Cook foods using healthy methods such as baking, boiling, grilling, and broiling instead.  Cook with heart-healthy oils, such as olive, canola, soybean, or sunflower oil. Meal planning   Eat a balanced diet that includes: ? 5 or more servings of fruits and vegetables each day. At each meal, try to fill half of your plate with fruits and vegetables. ? Up to 6-8 servings of whole grains each day. ? Less than 6 oz of lean meat, poultry, or fish each day. A 3-oz serving of meat is about the same size as a deck of cards. One egg equals 1 oz. ? 2 servings of low-fat dairy each day. ? A serving of nuts, seeds, or beans 5 times each week. ? Heart-healthy fats. Healthy fats called Omega-3 fatty acids are found in foods such as flaxseeds and coldwater fish, like sardines, salmon, and mackerel.  Limit how much you eat of the following: ? Canned or prepackaged foods. ? Food that is high in trans fat, such as fried foods. ? Food that is high in saturated fat, such as fatty meat. ? Sweets, desserts, sugary drinks, and other foods with added sugar. ? Full-fat dairy products.  Do not salt foods before eating.  Try to eat at least 2 vegetarian meals each week.  Eat more home-cooked food and less restaurant, buffet, and fast food.  When eating at a restaurant, ask that your food be prepared with less salt or no salt, if possible. What foods are recommended? The items listed may not be a complete list. Talk with your dietitian about what dietary choices are best for you. Grains Whole-grain or whole-wheat bread. Whole-grain or whole-wheat pasta. Brown rice. [place]. Bulgur. Whole-grain and low-sodium cereals. Pita bread. Low-fat, low-sodium  crackers. Whole-wheat flour tortillas. Vegetables Fresh or frozen vegetables (raw, steamed, roasted, or grilled). Low-sodium or reduced-sodium tomato and vegetable juice. Low-sodium or reduced-sodium tomato sauce and tomato paste. Low-sodium or reduced-sodium canned vegetables. Fruits All fresh, dried, or frozen fruit. Canned fruit in natural juice (without added sugar). Meat and other protein foods Skinless chicken or [place]. Ground chicken or [place]. Pork with fat trimmed off. Fish and seafood. Egg whites. Dried beans, peas, or lentils. Unsalted nuts, nut butters, and seeds. Unsalted canned beans. Lean cuts of beef with fat trimmed off. Low-sodium, lean deli meat. Dairy Low-fat (1%) or fat-free (skim) milk. Fat-free, low-fat, or reduced-fat cheeses. Nonfat, low-sodium ricotta or cottage cheese. Low-fat or nonfat yogurt. Low-fat, low-sodium cheese. Fats and oils Soft margarine without  trans fats. Vegetable oil. Low-fat, reduced-fat, or light mayonnaise and salad dressings (reduced-sodium). Canola, safflower, olive, soybean, and sunflower oils. Avocado. Seasoning and other foods Herbs. Spices. Seasoning mixes without salt. Unsalted popcorn and pretzels. Fat-free sweets. What foods are not recommended? The items listed may not be a complete list. Talk with your dietitian about what dietary choices are best for you. Grains Baked goods made with fat, such as croissants, muffins, or some breads. Dry pasta or rice meal packs. Vegetables Creamed or fried vegetables. Vegetables in a cheese sauce. Regular canned vegetables (not low-sodium or reduced-sodium). Regular canned tomato sauce and paste (not low-sodium or reduced-sodium). Regular tomato and vegetable juice (not low-sodium or reduced-sodium). Angie Fava. Olives. Fruits Canned fruit in a light or heavy syrup. Fried fruit. Fruit in cream or butter sauce. Meat and other protein foods Fatty cuts of meat. Ribs. Fried meat. Berniece Salines. Sausage. Bologna and  other processed lunch meats. Salami. Fatback. Hotdogs. Bratwurst. Salted nuts and seeds. Canned beans with added salt. Canned or smoked fish. Whole eggs or egg yolks. Chicken or [place] with skin. Dairy Whole or 2% milk, cream, and half-and-half. Whole or full-fat cream cheese. Whole-fat or sweetened yogurt. Full-fat cheese. Nondairy creamers. Whipped toppings. Processed cheese and cheese spreads. Fats and oils Butter. Stick margarine. Lard. Shortening. Ghee. Bacon fat. Tropical oils, such as coconut, palm kernel, or palm oil. Seasoning and other foods Salted popcorn and pretzels. Onion salt, garlic salt, seasoned salt, table salt, and sea salt. Worcestershire sauce. Tartar sauce. Barbecue sauce. Teriyaki sauce. Soy sauce, including reduced-sodium. Steak sauce. Canned and packaged gravies. Fish sauce. Oyster sauce. Cocktail sauce. Horseradish that you find on the shelf. Ketchup. Mustard. Meat flavorings and tenderizers. Bouillon cubes. Hot sauce and Tabasco sauce. Premade or packaged marinades. Premade or packaged taco seasonings. Relishes. Regular salad dressings. Where to find more information:  National Heart, Lung, and [place]: https://wilson-eaton.com/  American Heart Association: www.heart.org Summary  The DASH eating plan is a healthy eating plan that has been shown to reduce high blood pressure (hypertension). It may also reduce your risk for type 2 diabetes, heart disease, and stroke.  With the DASH eating plan, you should limit salt (sodium) intake to 2,300 mg a day. If you have hypertension, you may need to reduce your sodium intake to 1,500 mg a day.  When on the DASH eating plan, aim to eat more fresh fruits and vegetables, whole grains, lean proteins, low-fat dairy, and heart-healthy fats.  Work with your health care provider or diet and nutrition specialist (dietitian) to adjust your eating plan to your individual calorie needs. This information is not intended to replace advice  given to you by your health care provider. Make sure you discuss any questions you have with your health care provider. Document Released: 08/28/2011 Document Revised: 09/01/2016 Document Reviewed: 09/01/2016 Elsevier Interactive Patient Education  2018 [place] in the Home Falls can cause injuries. They can happen to people of all ages. There are many things you can do to make your home safe and to help prevent falls. What can I do on the outside of my home?  Regularly fix the edges of walkways and driveways and fix any cracks.  Remove anything that might make you trip as you walk through a door, such as a raised step or threshold.  Trim any bushes or trees on the path to your home.  Use bright outdoor lighting.  Clear any walking paths of anything that might make someone  trip, such as rocks or tools.  Regularly check to see if handrails are loose or broken. Make sure that both sides of any steps have handrails.  Any raised decks and porches should have guardrails on the edges.  Have any leaves, snow, or ice cleared regularly.  Use sand or salt on walking paths during winter.  Clean up any spills in your garage right away. This includes oil or grease spills. What can I do in the bathroom?  Use night lights.  Install grab bars by the toilet and in the tub and shower. Do not use towel bars as grab bars.  Use non-skid mats or decals in the tub or shower.  If you need to sit down in the shower, use a plastic, non-slip stool.  Keep the floor dry. Clean up any water that spills on the floor as soon as it happens.  Remove soap buildup in the tub or shower regularly.  Attach bath mats securely with double-sided non-slip rug tape.  Do not have throw rugs and other things on the floor that can make you trip. What can I do in the bedroom?  Use night lights.  Make sure that you have a light by your bed that is easy to reach.  Do not use any sheets or  blankets that are too big for your bed. They should not hang down onto the floor.  Have a firm chair that has side arms. You can use this for support while you get dressed.  Do not have throw rugs and other things on the floor that can make you trip. What can I do in the kitchen?  Clean up any spills right away.  Avoid walking on wet floors.  Keep items that you use a lot in easy-to-reach places.  If you need to reach something above you, use a strong step stool that has a grab bar.  Keep electrical cords out of the way.  Do not use floor polish or wax that makes floors slippery. If you must use wax, use non-skid floor wax.  Do not have throw rugs and other things on the floor that can make you trip. What can I do with my stairs?  Do not leave any items on the stairs.  Make sure that there are handrails on both sides of the stairs and use them. Fix handrails that are broken or loose. Make sure that handrails are as long as the stairways.  Check any carpeting to make sure that it is firmly attached to the stairs. Fix any carpet that is loose or worn.  Avoid having throw rugs at the top or bottom of the stairs. If you do have throw rugs, attach them to the floor with carpet tape.  Make sure that you have a light switch at the top of the stairs and the bottom of the stairs. If you do not have them, ask someone to add them for you. What else can I do to help prevent falls?  Wear shoes that: ? Do not have high heels. ? Have rubber bottoms. ? Are comfortable and fit you well. ? Are closed at the toe. Do not wear sandals.  If you use a stepladder: ? Make sure that it is fully opened. Do not climb a closed stepladder. ? Make sure that both sides of the stepladder are locked into place. ? Ask someone to hold it for you, if possible.  Clearly mark and make sure that you can see: ? Any grab  bars or handrails. ? First and last steps. ? Where the edge of each step is.  Use tools  that help you move around (mobility aids) if they are needed. These include: ? Canes. ? Walkers. ? Scooters. ? Crutches.  Turn on the lights when you go into a dark area. Replace any light bulbs as soon as they burn out.  Set up your furniture so you have a clear path. Avoid moving your furniture around.  If any of your floors are uneven, fix them.  If there are any pets around you, be aware of where they are.  Review your medicines with your doctor. Some medicines can make you feel dizzy. This can increase your chance of falling. Ask your doctor what other things that you can do to help prevent falls. This information is not intended to replace advice given to you by your health care provider. Make sure you discuss any questions you have with your health care provider. Document Released: 07/05/2009 Document Revised: 02/14/2016 Document Reviewed: 10/13/2014 Elsevier Interactive Patient Education  2018 [place] Maintenance, Male A healthy lifestyle and preventive care is important for your health and wellness. Ask your health care provider about what schedule of regular examinations is right for you. What should I know about weight and diet? Eat a Healthy Diet  Eat plenty of vegetables, fruits, whole grains, low-fat dairy products, and lean protein.  Do not eat a lot of foods high in solid fats, added sugars, or salt.  Maintain a Healthy Weight Regular exercise can help you achieve or maintain a healthy weight. You should:  Do at least 150 minutes of exercise each week. The exercise should increase your heart rate and make you sweat (moderate-intensity exercise).  Do strength-training exercises at least twice a week.  Watch Your Levels of Cholesterol and Blood Lipids  Have your blood tested for lipids and cholesterol every 5 years starting at 68 years of age. If you are at high risk for heart disease, you should start having your blood tested when you are 68 years  old. You may need to have your cholesterol levels checked more often if: ? Your lipid or cholesterol levels are high. ? You are older than 67 years of age. ? You are at high risk for heart disease.  What should I know about cancer screening? Many types of cancers can be detected early and may often be prevented. Lung Cancer  You should be screened every year for lung cancer if: ? You are a current smoker who has smoked for at least 30 years. ? You are a former smoker who has quit within the past 15 years.  Talk to your health care provider about your screening options, when you should start screening, and how often you should be screened.  Colorectal Cancer  Routine colorectal cancer screening usually begins at 68 years of age and should be repeated every 5-10 years until you are 68 years old. You may need to be screened more often if early forms of precancerous polyps or small growths are found. Your health care provider may recommend screening at an earlier age if you have risk factors for colon cancer.  Your health care provider may recommend using home test kits to check for hidden blood in the stool.  A small camera at the end of a tube can be used to examine your colon (sigmoidoscopy or colonoscopy). This checks for the earliest forms of colorectal cancer.  Prostate and Testicular  Cancer  Depending on your age and overall health, your health care provider may do certain tests to screen for prostate and testicular cancer.  Talk to your health care provider about any symptoms or concerns you have about testicular or prostate cancer.  Skin Cancer  Check your skin from head to toe regularly.  Tell your health care provider about any new moles or changes in moles, especially if: ? There is a change in a mole's size, shape, or color. ? You have a mole that is larger than a pencil eraser.  Always use sunscreen. Apply sunscreen liberally and repeat throughout the day.  Protect  yourself by wearing long sleeves, pants, a wide-brimmed hat, and sunglasses when outside.  What should I know about heart disease, diabetes, and high blood pressure?  If you are 37-86 years of age, have your blood pressure checked every 3-5 years. If you are 56 years of age or older, have your blood pressure checked every year. You should have your blood pressure measured twice-once when you are at a hospital or clinic, and once when you are not at a hospital or clinic. Record the average of the two measurements. To check your blood pressure when you are not at a hospital or clinic, you can use: ? An automated blood pressure machine at a pharmacy. ? A home blood pressure monitor.  Talk to your health care provider about your target blood pressure.  If you are between 71-43 years old, ask your health care provider if you should take aspirin to prevent heart disease.  Have regular diabetes screenings by checking your fasting blood sugar level. ? If you are at a normal weight and have a low risk for diabetes, have this test once every three years after the age of 78. ? If you are overweight and have a high risk for diabetes, consider being tested at a younger age or more often.  A one-time screening for abdominal aortic aneurysm (AAA) by ultrasound is recommended for men aged 12-75 years who are current or former smokers. What should I know about preventing infection? Hepatitis B If you have a higher risk for hepatitis B, you should be screened for this virus. Talk with your health care provider to find out if you are at risk for hepatitis B infection. Hepatitis C Blood testing is recommended for:  Everyone born from [birth year] through 1966.  Anyone with known risk factors for hepatitis C.  Sexually Transmitted Diseases (STDs)  You should be screened each year for STDs including gonorrhea and chlamydia if: ? You are sexually active and are younger than 68 years of age. ? You are older than 68  years of age and your health care provider tells you that you are at risk for this type of infection. ? Your sexual activity has changed since you were last screened and you are at an increased risk for chlamydia or gonorrhea. Ask your health care provider if you are at risk.  Talk with your health care provider about whether you are at high risk of being infected with HIV. Your health care provider may recommend a prescription medicine to help prevent HIV infection.  What else can I do?  Schedule regular health, dental, and eye exams.  Stay current with your vaccines (immunizations).  Do not use any tobacco products, such as cigarettes, chewing tobacco, and e-cigarettes. If you need help quitting, ask your health care provider.  Limit alcohol intake to no more than 2 drinks per day.  One drink equals 12 ounces of beer, 5 ounces of wine, or 1 ounces of hard liquor.  Do not use street drugs.  Do not share needles.  Ask your health care provider for help if you need support or information about quitting drugs.  Tell your health care provider if you often feel depressed.  Tell your health care provider if you have ever been abused or do not feel safe at home. This information is not intended to replace advice given to you by your health care provider. Make sure you discuss any questions you have with your health care provider. Document Released: 03/06/2008 Document Revised: 05/07/2016 Document Reviewed: 06/12/2015 Elsevier Interactive Patient Education  2018 [employer].   Hearing Loss Hearing loss is a partial or total loss of the ability to hear. This can be temporary or permanent, and it can happen in one or both ears. Hearing loss may be referred to as deafness. Medical care is necessary to treat hearing loss properly and to prevent the condition from getting worse. Your hearing may partially or completely come back, depending on what caused your hearing loss and how severe it is.  In some cases, hearing loss is permanent. What are the causes? Common causes of hearing loss include:  Too much wax in the ear canal.  Infection of the ear canal or middle ear.  Fluid in the middle ear.  Injury to the ear or surrounding area.  An object stuck in the ear.  Prolonged exposure to loud sounds, such as music.  Less common causes of hearing loss include:  Tumors in the ear.  Viral or bacterial infections, such as meningitis.  A hole in the eardrum (perforated eardrum).  Problems with the hearing nerve that sends signals between the brain and the ear.  Certain medicines.  What are the signs or symptoms? Symptoms of this condition may include:  Difficulty telling the difference between sounds.  Difficulty following a conversation when there is background noise.  Lack of response to sounds in your environment. This may be most noticeable when you do not respond to startling sounds.  Needing to turn up the volume on the television, radio, etc.  Ringing in the ears.  Dizziness.  Pain in the ears.  How is this diagnosed? This condition is diagnosed based on a physical exam and a hearing test (audiometry). The audiometry test will be performed by a hearing specialist (audiologist). You may also be referred to an ear, nose, and throat (ENT) specialist (otolaryngologist). How is this treated? Treatment for recent onset of hearing loss may include:  Ear wax removal.  Being prescribed medicines to prevent infection (antibiotics).  Being prescribed medicines to reduce inflammation (corticosteroids).  Follow these instructions at home:  If you were prescribed an antibiotic medicine, take it as told by your health care provider. Do not stop taking the antibiotic even if you start to feel better.  Take over-the-counter and prescription medicines only as told by your health care provider.  Avoid loud noises.  Return to your normal activities as told by your  health care provider. Ask your health care provider what activities are safe for you.  Keep all follow-up visits as told by your health care provider. This is important. Contact a health care provider if:  You feel dizzy.  You develop new symptoms.  You vomit or feel nauseous.  You have a fever. Get help right away if:  You develop sudden changes in your vision.  You have severe  ear pain.  You have new or increased weakness.  You have a severe headache. This information is not intended to replace advice given to you by your health care provider. Make sure you discuss any questions you have with your health care provider. Document Released: 09/08/2005 Document Revised: 02/14/2016 Document Reviewed: 01/24/2015 Elsevier Interactive Patient Education  2018 [employer].

## 2018-05-07 ENCOUNTER — Ambulatory Visit (INDEPENDENT_AMBULATORY_CARE_PROVIDER_SITE_OTHER): Payer: Medicare Other

## 2018-05-07 ENCOUNTER — Ambulatory Visit (INDEPENDENT_AMBULATORY_CARE_PROVIDER_SITE_OTHER): Payer: Medicare Other | Admitting: Family Medicine

## 2018-05-07 ENCOUNTER — Encounter: Payer: Self-pay | Admitting: Family Medicine

## 2018-05-07 VITALS — BP 160/96 | HR 79 | Temp 97.7°F | Ht 72.0 in | Wt 194.2 lb

## 2018-05-07 DIAGNOSIS — N401 Enlarged prostate with lower urinary tract symptoms: Secondary | ICD-10-CM

## 2018-05-07 DIAGNOSIS — I10 Essential (primary) hypertension: Secondary | ICD-10-CM

## 2018-05-07 DIAGNOSIS — R251 Tremor, unspecified: Secondary | ICD-10-CM | POA: Diagnosis not present

## 2018-05-07 DIAGNOSIS — N138 Other obstructive and reflux uropathy: Secondary | ICD-10-CM | POA: Diagnosis not present

## 2018-05-07 DIAGNOSIS — F411 Generalized anxiety disorder: Secondary | ICD-10-CM

## 2018-05-07 DIAGNOSIS — G8929 Other chronic pain: Secondary | ICD-10-CM

## 2018-05-07 DIAGNOSIS — K219 Gastro-esophageal reflux disease without esophagitis: Secondary | ICD-10-CM

## 2018-05-07 DIAGNOSIS — M25511 Pain in right shoulder: Secondary | ICD-10-CM | POA: Diagnosis not present

## 2018-05-07 DIAGNOSIS — Z9889 Other specified postprocedural states: Secondary | ICD-10-CM

## 2018-05-07 DIAGNOSIS — Z85820 Personal history of malignant melanoma of skin: Secondary | ICD-10-CM | POA: Diagnosis not present

## 2018-05-07 DIAGNOSIS — E782 Mixed hyperlipidemia: Secondary | ICD-10-CM

## 2018-05-07 LAB — LIPID PANEL
Cholesterol: 206 mg/dL — ABNORMAL HIGH (ref 0–200)
HDL: 54.5 mg/dL (ref >39.00)
LDL Cholesterol: 124 mg/dL — ABNORMAL HIGH (ref 0–99)
NonHDL: 151.23
TRIGLYCERIDES: 134 mg/dL (ref 0.0–149.0)
Total CHOL/HDL Ratio: 4
VLDL: 26.8 mg/dL (ref 0.0–40.0)

## 2018-05-07 LAB — CBC WITH DIFFERENTIAL/PLATELET
BASOS ABS: 0 10*3/uL (ref 0.0–0.1)
Basophils Relative: 0.9 % (ref 0.0–3.0)
Eosinophils Absolute: 0.1 10*3/uL (ref 0.0–0.7)
Eosinophils Relative: 2.1 % (ref 0.0–5.0)
HCT: 44.8 % (ref 39.0–52.0)
Hemoglobin: 14.8 g/dL (ref 13.0–17.0)
LYMPHS ABS: 1.4 10*3/uL (ref 0.7–4.0)
Lymphocytes Relative: 24.5 % (ref 12.0–46.0)
MCHC: 33 g/dL (ref 30.0–36.0)
MCV: 88.1 fl (ref 78.0–100.0)
MONOS PCT: 9.8 % (ref 3.0–12.0)
Monocytes Absolute: 0.6 10*3/uL (ref 0.1–1.0)
NEUTROS ABS: 3.5 10*3/uL (ref 1.4–7.7)
NEUTROS PCT: 62.7 % (ref 43.0–77.0)
Platelets: 202 10*3/uL (ref 150.0–400.0)
RBC: 5.09 Mil/uL (ref 4.22–5.81)
RDW: 14 % (ref 11.5–15.5)
WBC: 5.6 10*3/uL (ref 4.0–10.5)

## 2018-05-07 LAB — BASIC METABOLIC PANEL
BUN: 24 mg/dL — AB (ref 6–23)
CHLORIDE: 101 meq/L (ref 96–112)
CO2: 30 meq/L (ref 19–32)
CREATININE: 1.13 mg/dL (ref 0.40–1.50)
Calcium: 9.7 mg/dL (ref 8.4–10.5)
GFR: 68.66 mL/min (ref >60.00)
Glucose, Bld: 111 mg/dL — ABNORMAL HIGH (ref 70–99)
Potassium: 4.7 mEq/L (ref 3.5–5.1)
Sodium: 137 mEq/L (ref 135–145)

## 2018-05-07 LAB — POC URINALSYSI DIPSTICK (AUTOMATED)
BILIRUBIN UA: NEGATIVE
Glucose, UA: NEGATIVE
Ketones, UA: NEGATIVE
LEUKOCYTES UA: NEGATIVE
Nitrite, UA: NEGATIVE
PH UA: 6.5 (ref 5.0–8.0)
Protein, UA: NEGATIVE
RBC UA: NEGATIVE
Spec Grav, UA: 1.01 (ref 1.010–1.025)
Urobilinogen, UA: 0.2 E.U./dL

## 2018-05-07 LAB — HEPATIC FUNCTION PANEL
ALBUMIN: 4.4 g/dL (ref 3.5–5.2)
ALT: 19 U/L (ref 0–53)
AST: 20 U/L (ref 0–37)
Alkaline Phosphatase: 46 U/L (ref 39–117)
Bilirubin, Direct: 0.1 mg/dL (ref 0.0–0.3)
Total Bilirubin: 0.6 mg/dL (ref 0.2–1.2)
Total Protein: 7.2 g/dL (ref 6.0–8.3)

## 2018-05-07 LAB — PSA: PSA: 1.1 ng/mL (ref 0.10–4.00)

## 2018-05-07 LAB — TSH: TSH: 1.36 u[IU]/mL (ref 0.35–4.50)

## 2018-05-07 MED ORDER — LOSARTAN POTASSIUM 100 MG PO TABS: 100.0000 mg | ORAL_TABLET | Freq: Every day | ORAL | 3 refills | Status: DC

## 2018-05-07 MED ORDER — DULOXETINE HCL 20 MG PO CPEP: ORAL_CAPSULE | ORAL | 3 refills | Status: DC

## 2018-05-07 NOTE — Progress Notes (Signed)
   Subjective:    Patient ID: [name], male    DOB: 1951-03-07, 67 y.o.   MRN: [id number]  HPI Here to follow up on issues. His BP has been creeping up the past few months often in the 150s over 90s. He feels well in general. He does complain of pain in the anterior right shoulder for the past year. No hx of trauma. This is mild and he does not treat it with anything. He saw Dr. Carles Collet for his tremors and she felt these were benign and not a sign of any neurodegenerative processes. He sees Dr. Junious Silk yearly for prostate exams.    Review of Systems  Constitutional: Negative.   HENT: Negative.   Eyes: Negative.   Respiratory: Negative.   Cardiovascular: Negative.   Gastrointestinal: Negative.   Genitourinary: Negative.   Musculoskeletal: Positive for arthralgias.  Skin: Negative.   Neurological: Positive for tremors.  Psychiatric/Behavioral: Negative.        Objective:   Physical Exam  Constitutional: He is oriented to person, place, and time. He appears well-developed and well-nourished. No distress.  HENT:  Head: Normocephalic and atraumatic.  Right Ear: External ear normal.  Left Ear: External ear normal.  Nose: Nose normal.  Mouth/Throat: Oropharynx is clear and moist. No oropharyngeal exudate.  Eyes: Pupils are equal, round, and reactive to light. Conjunctivae and EOM are normal. Right eye exhibits no discharge. Left eye exhibits no discharge. No scleral icterus.  Neck: Neck supple. No JVD present. No tracheal deviation present. No thyromegaly present.  Cardiovascular: Normal rate, regular rhythm, normal heart sounds and intact distal pulses. Exam reveals no gallop and no friction rub.  No murmur heard. Pulmonary/Chest: Effort normal and breath sounds normal. No respiratory distress. He has no wheezes. He has no rales. He exhibits no tenderness.  Abdominal: Soft. Bowel sounds are normal. He exhibits no distension and no mass. There is no tenderness. There is no rebound  and no guarding.  Musculoskeletal: Normal range of motion. He exhibits no edema.  He is tender in the anterior right shoulder at the subacromial area. No crepitus and full ROM   Lymphadenopathy:    He has no cervical adenopathy.  Neurological: He is alert and oriented to person, place, and time. He has normal reflexes. He displays normal reflexes. No cranial nerve deficit. He exhibits normal muscle tone. Coordination normal.  Mild resting tremors in the the right hand  Skin: Skin is warm and dry. No rash noted. He is not diaphoretic. No erythema. No pallor.  Psychiatric: He has a normal mood and affect. His behavior is normal. Judgment and thought content normal.          Assessment & Plan:  His HTN is a little out of control so we will increase the Losartan to 100 mg daily. He has some subacromial bursitis, which he can treat with ice and Ibuprofen. He does not feel he needs a cortisone injection at this point. His anxity is well controlled. We will get fatsing labs to check his lipids, PSA, etc. Get a CXR to screen for melanoma. Get Xrays of the right shoulder.  [name] Penna, MD

## 2018-05-26 ENCOUNTER — Encounter: Payer: Self-pay | Admitting: Family Medicine

## 2018-05-26 ENCOUNTER — Ambulatory Visit: Payer: Medicare Other | Admitting: Family Medicine

## 2018-05-26 VITALS — BP 140/88 | HR 89 | Temp 98.2°F | Ht 72.0 in | Wt 194.2 lb

## 2018-05-26 DIAGNOSIS — F529 Unspecified sexual dysfunction not due to a substance or known physiological condition: Secondary | ICD-10-CM | POA: Diagnosis not present

## 2018-05-26 DIAGNOSIS — F528 Other sexual dysfunction not due to a substance or known physiological condition: Secondary | ICD-10-CM

## 2018-05-26 DIAGNOSIS — I10 Essential (primary) hypertension: Secondary | ICD-10-CM

## 2018-05-26 DIAGNOSIS — W57XXXA Bitten or stung by nonvenomous insect and other nonvenomous arthropods, initial encounter: Secondary | ICD-10-CM | POA: Diagnosis not present

## 2018-05-26 DIAGNOSIS — S30861A Insect bite (nonvenomous) of abdominal wall, initial encounter: Secondary | ICD-10-CM | POA: Diagnosis not present

## 2018-05-26 MED ORDER — HYDROCHLOROTHIAZIDE 12.5 MG PO CAPS: 12.5000 mg | ORAL_CAPSULE | Freq: Every day | ORAL | 2 refills | Status: DC

## 2018-05-26 MED ORDER — DOXYCYCLINE HYCLATE 100 MG PO CAPS: 100.0000 mg | ORAL_CAPSULE | Freq: Two times a day (BID) | ORAL | 0 refills | Status: AC

## 2018-05-26 MED ORDER — LOSARTAN POTASSIUM 50 MG PO TABS: 50.0000 mg | ORAL_TABLET | Freq: Every day | ORAL | 0 refills | Status: DC

## 2018-05-26 NOTE — Progress Notes (Signed)
   Subjective:    Patient ID: [name], male    DOB: 07/29/1951, 67 y.o.   MRN: [id number]  HPI Here for 2 issues. First he pulled a tick off his left flank 4 days ago and since then the spot has become red and he has developed a mild headache. No fever and no other rashes. Also we recently increased the dose of his Losartan to 100 mg daily (he was taking 2 tabs of 50 mg). His BP has settled down nicely but he has noticed a loss of libido and he has erection difficulties. This was not a problem on the 50 mg dose.    Review of Systems  Constitutional: Negative.   Respiratory: Negative.   Cardiovascular: Negative.   Skin: Positive for color change.  Neurological: Positive for headaches.       Objective:   Physical Exam  Constitutional: He is oriented to person, place, and time. He appears well-developed and well-nourished.  Cardiovascular: Normal rate, regular rhythm, normal heart sounds and intact distal pulses.  Pulmonary/Chest: Effort normal and breath sounds normal.  Neurological: He is alert and oriented to person, place, and time.  Skin:  The left flank has a tiny scab surrounded by a 1 cm zone of erythema. No warmth or tenderness          Assessment & Plan:  For the tick bite we will cover for possible tick borne illnesses with Doxycycline for 10 days. For the BP, we will decrease the Losartan back to 50 mg daily and add HCTZ 12.5 mg daily. He will give [place] a report in 2-3 weeks.  [name] Penna, MD

## 2018-06-15 ENCOUNTER — Other Ambulatory Visit: Payer: Self-pay | Admitting: Family Medicine

## 2018-06-17 ENCOUNTER — Other Ambulatory Visit: Payer: Self-pay | Admitting: Family Medicine

## 2018-08-07 ENCOUNTER — Other Ambulatory Visit: Payer: Self-pay | Admitting: Family Medicine

## 2018-08-16 ENCOUNTER — Ambulatory Visit: Payer: Medicare Other | Admitting: Family Medicine

## 2018-08-16 ENCOUNTER — Encounter: Payer: Self-pay | Admitting: Family Medicine

## 2018-08-16 VITALS — BP 126/84 | HR 80 | Temp 98.2°F | Wt 193.1 lb

## 2018-08-16 DIAGNOSIS — F411 Generalized anxiety disorder: Secondary | ICD-10-CM

## 2018-08-16 DIAGNOSIS — I10 Essential (primary) hypertension: Secondary | ICD-10-CM

## 2018-08-16 MED ORDER — LORAZEPAM 1 MG PO TABS: 1.0000 mg | ORAL_TABLET | Freq: Four times a day (QID) | ORAL | 5 refills | Status: DC | PRN

## 2018-08-16 NOTE — Progress Notes (Signed)
   Subjective:    Patient ID: [name], male    DOB: 05-15-1951, 67 y.o.   MRN: [id number]  HPI Here for refills. He is doing well. His BP at home is stable. His anxiety is stable.    Review of Systems  Constitutional: Negative.   Respiratory: Negative.   Cardiovascular: Negative.   Psychiatric/Behavioral: Negative for dysphoric mood. The patient is nervous/anxious.        Objective:   Physical Exam  Constitutional: He is oriented to person, place, and time. He appears well-developed and well-nourished.  Cardiovascular: Normal rate, regular rhythm, normal heart sounds and intact distal pulses.  Pulmonary/Chest: Effort normal and breath sounds normal.  Neurological: He is alert and oriented to person, place, and time.  Psychiatric: He has a normal mood and affect. His behavior is normal. Thought content normal.          Assessment & Plan:  HTN and anxiety are stable.  [name] Penna, MD

## 2018-08-17 ENCOUNTER — Telehealth: Payer: Self-pay | Admitting: Family Medicine

## 2018-08-17 NOTE — Telephone Encounter (Signed)
Copied from [place] [phone]. Topic: Quick Communication - See Telephone Encounter >> Aug 17, 2018 12:32 PM [name] wrote: CRM for notification. See Telephone encounter for: 08/17/18.  Pt says that he was seen yesterday and on his med list he noticed that his Rx for losartan is for (COZAAR) 50 MG tablet. Pt says that he is currently taking 100 MG per provider. Pt would like clarity and to have his chart update.   CB: [phone]

## 2018-08-18 NOTE — Telephone Encounter (Signed)
Pt calling back and is concerned about the losartan---on his med list it is stated that he is taking the 50 mg daily but he stated that he is currently taking the 100 mg daily.  Dr. Sarajane Jews please advise. Thanks

## 2018-08-20 ENCOUNTER — Other Ambulatory Visit: Payer: Self-pay | Admitting: Family Medicine

## 2018-08-23 MED ORDER — LOSARTAN POTASSIUM 100 MG PO TABS: 100.0000 mg | ORAL_TABLET | Freq: Every day | ORAL | 3 refills | Status: DC

## 2018-08-23 NOTE — Telephone Encounter (Signed)
I changed this to 100 mg in the chart

## 2018-09-30 ENCOUNTER — Other Ambulatory Visit: Payer: Self-pay | Admitting: Family Medicine

## 2018-10-01 NOTE — Telephone Encounter (Signed)
Patient is calling and states that when he tried to get a refill at the pharmacy they told him that this prescription has been canceled. Patient states his bottle states that he had 3 refills but they are telling him opposite. Patient would like a follow up on this.

## 2018-10-13 ENCOUNTER — Telehealth: Payer: Self-pay | Admitting: Neurology

## 2018-10-13 NOTE — Telephone Encounter (Signed)
Okay. Thank you.

## 2018-10-13 NOTE — Telephone Encounter (Signed)
Patient called and wanted to be seen sooner than his June appointment. He has started to experience new symptoms. He has noticed his head shaking and another Doctor noticed it at another appointment. He also said he noticed while at [facility] he noticed himself take two steps to the right and not tell himself to. He wanted to be added on the wait list. Thanks

## 2018-10-13 NOTE — Telephone Encounter (Signed)
Agree to put on wait list and call with cx.

## 2018-10-13 NOTE — Telephone Encounter (Signed)
Reviewed last office note. It was not recommended for patient to start any medication last visit. The wait list seems appropriate, as this doesn't sound urgent. Dr. Carles Collet - please advise.

## 2018-10-20 ENCOUNTER — Ambulatory Visit: Payer: Medicare Other | Admitting: Family Medicine

## 2018-10-20 ENCOUNTER — Encounter: Payer: Self-pay | Admitting: Family Medicine

## 2018-10-20 VITALS — BP 132/84 | HR 71 | Temp 99.0°F | Wt 198.5 lb

## 2018-10-20 DIAGNOSIS — F411 Generalized anxiety disorder: Secondary | ICD-10-CM | POA: Diagnosis not present

## 2018-10-20 MED ORDER — DULOXETINE HCL 30 MG PO CPEP: 30.0000 mg | ORAL_CAPSULE | Freq: Every day | ORAL | 3 refills | Status: DC

## 2018-10-20 MED ORDER — CYCLOBENZAPRINE HCL 10 MG PO TABS: 10.0000 mg | ORAL_TABLET | Freq: Every day | ORAL | 3 refills | Status: DC

## 2018-10-20 NOTE — Progress Notes (Signed)
   Subjective:    Patient ID: [name], male    DOB: 1951-08-10, 68 y.o.   MRN: [id number]  HPI Here to discuss anxiety. He had been doing well on 20 mg of Cymbalta daily, but he went back to work part time in December, and since then his anxiety levels have gone up. He feels nervous and he worries about things during the day. He sleeps well and his appetite is preserved. He averages taking 2-3 Lorazepam tablets a day.    Review of Systems  Constitutional: Negative.   Respiratory: Negative.   Cardiovascular: Negative.   Psychiatric/Behavioral: Negative for agitation, confusion, decreased concentration, dysphoric mood, hallucinations, self-injury, sleep disturbance and suicidal ideas. The patient is nervous/anxious.        Objective:   Physical Exam Constitutional:      Appearance: Normal appearance.  Cardiovascular:     Rate and Rhythm: Normal rate and regular rhythm.     Pulses: Normal pulses.     Heart sounds: Normal heart sounds.  Pulmonary:     Effort: Pulmonary effort is normal.     Breath sounds: Normal breath sounds.  Neurological:     Mental Status: He is alert.  Psychiatric:        Mood and Affect: Mood normal.        Behavior: Behavior normal.        Thought Content: Thought content normal.           Assessment & Plan:  His anxiety has worsened a bit, so we will increase the Cymbalta back to 30 mg daily. Use Lorazepam prn.  [name] Penna, MD

## 2018-10-27 NOTE — Telephone Encounter (Signed)
Error

## 2019-01-27 NOTE — Progress Notes (Deleted)
Virtual Visit via Video Note The purpose of this virtual visit is to provide medical care while limiting exposure to the novel coronavirus.    Consent was obtained for video visit:  {yes no:314532} Answered questions that patient had about telehealth interaction:  {yes no:314532} I discussed the limitations, risks, security and privacy concerns of performing an evaluation and management service by telemedicine. I also discussed with the patient that there may be a patient responsible charge related to this service. The patient expressed understanding and agreed to proceed.  Pt location: Home Physician Location: office Name of referring provider:  [name], MD I connected with [name] at patients initiation/request on 01/31/2019 at  9:00 AM EDT by video enabled telemedicine application and verified that I am speaking with the correct person using two identifiers. Pt MRN:  [id number] Pt DOB:  06-Jul-1951 Video Participants:  [name];  ***   History of Present Illness: *** Patient has a history of tremor.  He reports that ***.  Medical records have been reviewed since last visit.  He was at his primary care doctor recently with increasing anxiety because he went back to work part-time.  He was taking Xanax 2 or 3 times per day.  His Cymbalta was increased from 20 mg to 30 mg.   Observations/Objective:   There were no vitals filed for this visit. GEN:  The patient appears stated age and is in NAD.  Neurological examination:  Orientation: The patient is alert and oriented x3. Cranial nerves: There is good facial symmetry. There is ***facial hypomimia.  The speech is fluent and clear. Soft palate rises symmetrically and there is no tongue deviation. Hearing is intact to conversational tone. Motor: Strength is at least antigravity x 4.   Shoulder shrug is equal and symmetric.  There is no pronator drift.  Movement examination: Tone: unable Abnormal movements: ***Mild  tremor of both arms at rest.  He has very mild postural tremor. Coordination:  There is *** decremation with RAM's, *** Gait and Station: The patient has *** difficulty arising out of a deep-seated chair without the use of the hands. The patient's stride length is ***.      Assessment and Plan:   1.  Tremor             -he has features of rest and intention tremor, but both are very mild.  I reassured him that I saw no evidence of a neurodegenerative disorder such as Parkinson's disease.  I also told him that does not mean it could not develop in the future, but he certainly does not have that today.  Tremor is quite mild and while we discussed medications, I did not recommend them for him given mild nature and he was okay with that.    -***We did discuss the fact that anxiety can play a role into tremor and increase tremor.  He reports that his anxiety has been increased lately.  Follow Up Instructions:    -I discussed the assessment and treatment plan with the patient. The patient was provided an opportunity to ask questions and all were answered. The patient agreed with the plan and demonstrated an understanding of the instructions.   The patient was advised to call back or seek an in-person evaluation if the symptoms worsen or if the condition fails to improve as anticipated.    Total Time spent in visit with the patient was:  ***, of which more than 50% of the time was spent  in counseling and/or coordinating care on ***.   Pt understands and agrees with the plan of care outlined.     Alonza Bogus, DO

## 2019-01-31 ENCOUNTER — Telehealth: Payer: Medicare Other | Admitting: Neurology

## 2019-02-04 NOTE — Progress Notes (Signed)
Virtual Visit via Video Note The purpose of this virtual visit is to provide medical care while limiting exposure to the novel coronavirus.    Consent was obtained for video visit:  Yes.   Answered questions that patient had about telehealth interaction:  Yes.   I discussed the limitations, risks, security and privacy concerns of performing an evaluation and management service by telemedicine. I also discussed with the patient that there may be a patient responsible charge related to this service. The patient expressed understanding and agreed to proceed.  Pt location: Home Physician Location: office Name of referring provider:  [name], MD I connected with [name] at patients initiation/request on 02/07/2019 at  1:00 PM EDT by video enabled telemedicine application and verified that I am speaking with the correct person using two identifiers. Pt MRN:  [id number] Pt DOB:  [birth year] Video Participants:  [name];     History of Present Illness:  Patient is seen today in follow-up for tremor, both at rest as well as intention.  Tremor comes and goes.  If he is busy or using the arms a lot, tremor seems worse.  He recently cleaned his whole truck and tremor increased after that for a few days.   Medical records have been reviewed since our last visit.  Anxiety has increased.  He is on Cymbalta.  His Cymbalta was increased from 20 mg to 30 mg at the end of January as he was back to work (tutoring math) but he isn't doing that now so anxiety is better.  He takes 2 or 3 lorazepam per day.  Has seen Dr. Renda Rolls for dermatology since our last visit (saw her in March).  She mentioned to him that she noticed some head tremor.  He will sometimes notice it if something is coming toward his face.  As an aside, he mentions several episodes that have happened over the last year that were unrelated to one another.  With one incident, he was at [facility] and was looking straight ahead and  then involuntarily, took 3 steps to the right and did not mean to.  He had no lateralizing weakness or paresthesias.  He just thought it was odd that he took 3 steps to the right.  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  He wondered what the students were thinking and then "like a light switch it was gone."  He really cannot describe to me what he felt, except to state that perhaps it was a buzzing feeling.  Current Outpatient Medications on File Prior to Visit  Medication Sig Dispense Refill  . cyclobenzaprine (FLEXERIL) 10 MG tablet Take 1 tablet (10 mg total) by mouth at bedtime. 150 tablet 3  . DULoxetine (CYMBALTA) 30 MG capsule Take 1 capsule (30 mg total) by mouth daily. 30 capsule 3  . finasteride ([name]) 5 MG tablet Take 1 tablet (5 mg total) by mouth daily. 1 tablet 0  . FLUAD 0.5 ML SUSY TO BE ADMINISTERED BY PHARMACIST FOR IMMUNIZATION  0  . ibuprofen (ADVIL,MOTRIN) 800 MG tablet TAKE 1 TABLET (800 MG TOTAL) BY MOUTH EVERY 6 (SIX) HOURS AS NEEDED FOR MODERATE PAIN. 60 tablet 5  . LORazepam (ATIVAN) 1 MG tablet Take 1 tablet (1 mg total) by mouth every 6 (six) hours as needed. 120 tablet 5  . losartan (COZAAR) 100 MG tablet TAKE  1 TABLET BY MOUTH EVERY DAY 90 tablet 3  . Melatonin 10 MG TABS Take by mouth as needed.    Marland Kitchen omeprazole (PRILOSEC) 20 MG capsule TAKE 1 CAPSULE (20 MG TOTAL) BY MOUTH 2 (TWO) TIMES DAILY BEFORE A MEAL. 60 capsule 11  . tamsulosin (FLOMAX) 0.4 MG CAPS capsule Take 0.4 mg by mouth daily.    Marland Kitchen triamcinolone cream (KENALOG) 0.1 % Apply 1 application topically 2 (two) times daily as needed.    . [DISCONTINUED] tadalafil (CIALIS) 20 MG tablet Take 20 mg by mouth daily as needed.       No current facility-administered medications on file prior to visit.       Observations/Objective:   Vitals:   02/07/19 1245  Weight: 190 lb (86.2 kg)  Height: 6' (1.829 m)    GEN:  The patient appears stated age and is in NAD.  Neurological examination:  Orientation: The patient is alert and oriented x3. Cranial nerves: There is good facial symmetry. There is no facial hypomimia.  The speech is fluent and clear. Soft palate rises symmetrically and there is no tongue deviation. Hearing is intact to conversational tone. Motor: Strength is at least antigravity x 4.   Shoulder shrug is equal and symmetric.  There is no pronator drift.  Movement examination: Tone: unable Abnormal movements: There is very minimal tremor of the outstretched hands today, perhaps just a mild bit on the right.  I saw no rest tremor today.  He had no trouble with Archimedes spirals today.  He grabbed a water bottle and I did not see any tremor when he held that either proximally or distally. Coordination:  There is no decremation with RAM's, with hand opening and closing or finger taps. Gait and Station: The patient was able to walk well down the hall.    Assessment and Plan:   1.    Tremor  -I do not see any features of Parkinson's disease today.  I have not seen him over a time span of 1 year.  I think this likely just represents essential tremor.  Reassurance was provided today.  The essential tremor is very mild and no medication was recommended, and he agreed.  -He asked me about doing an MRI of the brain, and I told him if it was for tremor, I would not recommend that.  If there was anything bad, this would have showed up over the year.  He has nothing focal or lateralizing on his examination.  However, he does describe several incidents, as above.  These were very isolated and each 1 of them separate and unlike the others.  We did discuss MRI of the brain for these (mild associated dizziness), but he will think about that and let me know.  He declined for today.  If he changes his mind, he can just call me back and we can see if we can get that.  Otherwise, I will see the patient back on  an as-needed basis.    Follow Up Instructions:    -I discussed the assessment and treatment plan with the patient. The patient was provided an opportunity to ask questions and all were answered. The patient agreed with the plan and demonstrated an understanding of the instructions.   The patient was advised to call back or seek an in-person evaluation if the symptoms worsen or if the condition fails to improve as anticipated.    Total Time spent in visit with the patient was: 20 minutes  Alonza Bogus, DO

## 2019-02-07 ENCOUNTER — Telehealth (INDEPENDENT_AMBULATORY_CARE_PROVIDER_SITE_OTHER): Payer: Medicare Other | Admitting: Neurology

## 2019-02-07 ENCOUNTER — Encounter: Payer: Self-pay | Admitting: Neurology

## 2019-02-07 ENCOUNTER — Other Ambulatory Visit: Payer: Self-pay

## 2019-02-07 DIAGNOSIS — G250 Essential tremor: Secondary | ICD-10-CM

## 2019-02-11 ENCOUNTER — Other Ambulatory Visit: Payer: Self-pay | Admitting: Family Medicine

## 2019-02-15 ENCOUNTER — Ambulatory Visit (INDEPENDENT_AMBULATORY_CARE_PROVIDER_SITE_OTHER): Payer: Medicare Other | Admitting: Family Medicine

## 2019-02-15 ENCOUNTER — Other Ambulatory Visit: Payer: Self-pay

## 2019-02-15 ENCOUNTER — Encounter: Payer: Self-pay | Admitting: Family Medicine

## 2019-02-15 DIAGNOSIS — F411 Generalized anxiety disorder: Secondary | ICD-10-CM | POA: Diagnosis not present

## 2019-02-15 MED ORDER — LORAZEPAM 1 MG PO TABS: 1.0000 mg | ORAL_TABLET | Freq: Four times a day (QID) | ORAL | 5 refills | Status: DC | PRN

## 2019-02-15 MED ORDER — DULOXETINE HCL 20 MG PO CPEP: 20.0000 mg | ORAL_CAPSULE | Freq: Every day | ORAL | 11 refills | Status: DC

## 2019-02-15 NOTE — Progress Notes (Signed)
Subjective:    Patient ID: [name], male    DOB: Oct 08, 1950, 68 y.o.   MRN: [id number]  HPI Virtual Visit via Video Note  I connected with the patient on 02/15/19 at  2:30 PM EDT by a video enabled telemedicine application and verified that I am speaking with the correct person using two identifiers.  Location patient: home Location provider:work or home office Persons participating in the virtual visit: patient, provider  I discussed the limitations of evaluation and management by telemedicine and the availability of in person appointments. The patient expressed understanding and agreed to proceed.   HPI: Here to discuss several issues. 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 He wants to decrease the Duloxitine back to 20 mg daily. 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 He notes his age and risk factors like HTN.    ROS: See pertinent positives and negatives per HPI.  Past Medical History:  Diagnosis Date  . Anxiety   . Arthritis   . Cancer ([place]) 2010   basal cell ca, head  . Cataract    bilateral  . Depression   . ED (erectile dysfunction)   . GERD (gastroesophageal reflux disease)   . Hyperlipidemia   . Hypertension   . Kidney stone 07-29-11   passed   . 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 Vitreous floater    left eye, sees Dr. Dawna Part at [facility]     Past Surgical History:  Procedure Laterality Date  . BASAL CELL CARCINOMA EXCISION     removed from scalp  . COLONOSCOPY  07/03/2017   per Dr. Havery Moros, sessile serrated polyp, repeat in 5 yrs   . HERNIA REPAIR      Family History  Problem Relation Age of Onset  . Hypertension Mother   . Hyperlipidemia Mother   . Depression Unknown   . Diabetes Unknown   . Hyperlipidemia Unknown   . Hypertension Unknown   . Alzheimer's disease Unknown   .  Colon cancer Neg Hx      Current Outpatient Medications:  .  cyclobenzaprine (FLEXERIL) 10 MG tablet, Take 1 tablet (10 mg total) by mouth at bedtime., Disp: 150 tablet, Rfl: 3 .  DULoxetine (CYMBALTA) 30 MG capsule, Take 1 capsule (30 mg total) by mouth daily., Disp: 30 capsule, Rfl: 3 .  finasteride ([name]) 5 MG tablet, Take 1 tablet (5 mg total) by mouth daily., Disp: 1 tablet, Rfl: 0 .  FLUAD 0.5 ML SUSY, TO BE ADMINISTERED BY PHARMACIST FOR IMMUNIZATION, Disp: , Rfl: 0 .  ibuprofen (ADVIL,MOTRIN) 800 MG tablet, TAKE 1 TABLET (800 MG TOTAL) BY MOUTH EVERY 6 (SIX) HOURS AS NEEDED FOR MODERATE PAIN., Disp: 60 tablet, Rfl: 5 .  LORazepam (ATIVAN) 1 MG tablet, Take 1 tablet (1 mg total) by mouth every 6 (six) hours as needed., Disp: 120 tablet, Rfl: 5 .  losartan (COZAAR) 100 MG tablet, TAKE 1 TABLET BY MOUTH EVERY DAY, Disp: 90 tablet, Rfl: 3 .  Melatonin 10 MG TABS, Take by mouth as needed., Disp: , Rfl:  .  omeprazole (PRILOSEC) 20 MG capsule, TAKE 1 CAPSULE (20 MG TOTAL) BY MOUTH 2 (TWO) TIMES DAILY BEFORE A MEAL., Disp: 60 capsule, Rfl: 11 .  tamsulosin (FLOMAX) 0.4 MG CAPS capsule, Take 0.4 mg by mouth daily., Disp: , Rfl:  .  triamcinolone cream (KENALOG) 0.1 %, Apply 1 application topically 2 (two) times daily as needed., Disp: , Rfl:   EXAM:  VITALS per patient if applicable:  GENERAL: alert, oriented, appears well and in no acute distress  HEENT: atraumatic, conjunttiva clear, no obvious abnormalities on inspection of external nose and ears  NECK: normal movements of the head and neck  LUNGS: on inspection no signs of respiratory distress, breathing rate appears normal, no obvious gross SOB, gasping or wheezing  CV: no obvious cyanosis  MS: moves all visible extremities without noticeable abnormality  PSYCH/NEURO: pleasant and cooperative, no obvious depression or anxiety, speech and thought processing grossly intact  ASSESSMENT AND PLAN: His anxiety is stable so we  will decrease Cymbalta to 20 mg daily. 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  [name] Penna, MD  Discussed the following assessment and plan:  No diagnosis found.     I discussed the assessment and treatment plan with the patient. The patient was provided an opportunity to ask questions and all were answered. The patient agreed with the plan and demonstrated an understanding of the instructions.   The patient was advised to call back or seek an in-person evaluation if the symptoms worsen or if the condition fails to improve as anticipated.     Review of Systems     Objective:   Physical Exam        Assessment & Plan:

## 2019-02-24 ENCOUNTER — Ambulatory Visit: Payer: Medicare Other | Admitting: Neurology

## 2019-02-24 ENCOUNTER — Encounter

## 2019-03-31 ENCOUNTER — Other Ambulatory Visit: Payer: Self-pay | Admitting: Family Medicine

## 2019-04-07 NOTE — Telephone Encounter (Signed)
Patient is calling again regarding his medication.  It was requested on 03/31/19.  Please advise and call patient at [phone]

## 2019-05-09 ENCOUNTER — Ambulatory Visit: Payer: Medicare Other

## 2019-05-17 ENCOUNTER — Other Ambulatory Visit: Payer: Self-pay

## 2019-05-17 ENCOUNTER — Ambulatory Visit (INDEPENDENT_AMBULATORY_CARE_PROVIDER_SITE_OTHER): Payer: Medicare Other

## 2019-05-17 ENCOUNTER — Encounter: Payer: Self-pay | Admitting: Family Medicine

## 2019-05-17 ENCOUNTER — Telehealth: Payer: Self-pay | Admitting: *Deleted

## 2019-05-17 ENCOUNTER — Ambulatory Visit (INDEPENDENT_AMBULATORY_CARE_PROVIDER_SITE_OTHER): Payer: Medicare Other | Admitting: Family Medicine

## 2019-05-17 VITALS — BP 142/80 | HR 74 | Temp 98.2°F | Wt 195.8 lb

## 2019-05-17 DIAGNOSIS — R9389 Abnormal findings on diagnostic imaging of other specified body structures: Secondary | ICD-10-CM

## 2019-05-17 DIAGNOSIS — Z85820 Personal history of malignant melanoma of skin: Secondary | ICD-10-CM | POA: Diagnosis not present

## 2019-05-17 DIAGNOSIS — Z0000 Encounter for general adult medical examination without abnormal findings: Secondary | ICD-10-CM

## 2019-05-17 LAB — BASIC METABOLIC PANEL
BUN: 19 mg/dL (ref 6–23)
CO2: 28 mEq/L (ref 19–32)
Calcium: 9 mg/dL (ref 8.4–10.5)
Chloride: 100 mEq/L (ref 96–112)
Creatinine, Ser: 1.13 mg/dL (ref 0.40–1.50)
GFR: 64.4 mL/min (ref >60.00)
Glucose, Bld: 108 mg/dL — ABNORMAL HIGH (ref 70–99)
Potassium: 4.5 mEq/L (ref 3.5–5.1)
Sodium: 135 mEq/L (ref 135–145)

## 2019-05-17 LAB — LIPID PANEL
Cholesterol: 214 mg/dL — ABNORMAL HIGH (ref 0–200)
HDL: 53.4 mg/dL (ref >39.00)
LDL Cholesterol: 138 mg/dL — ABNORMAL HIGH (ref 0–99)
NonHDL: 160.74
Total CHOL/HDL Ratio: 4
Triglycerides: 113 mg/dL (ref 0.0–149.0)
VLDL: 22.6 mg/dL (ref 0.0–40.0)

## 2019-05-17 LAB — HEPATIC FUNCTION PANEL
ALT: 17 U/L (ref 0–53)
AST: 19 U/L (ref 0–37)
Albumin: 4.4 g/dL (ref 3.5–5.2)
Alkaline Phosphatase: 44 U/L (ref 39–117)
Bilirubin, Direct: 0.1 mg/dL (ref 0.0–0.3)
Total Bilirubin: 0.4 mg/dL (ref 0.2–1.2)
Total Protein: 6.9 g/dL (ref 6.0–8.3)

## 2019-05-17 LAB — CBC WITH DIFFERENTIAL/PLATELET
Basophils Absolute: 0.1 10*3/uL (ref 0.0–0.1)
Basophils Relative: 0.9 % (ref 0.0–3.0)
Eosinophils Absolute: 0.2 10*3/uL (ref 0.0–0.7)
Eosinophils Relative: 3.3 % (ref 0.0–5.0)
HCT: 42.5 % (ref 39.0–52.0)
Hemoglobin: 14.2 g/dL (ref 13.0–17.0)
Lymphocytes Relative: 20.7 % (ref 12.0–46.0)
Lymphs Abs: 1.2 10*3/uL (ref 0.7–4.0)
MCHC: 33.5 g/dL (ref 30.0–36.0)
MCV: 88.4 fl (ref 78.0–100.0)
Monocytes Absolute: 0.5 10*3/uL (ref 0.1–1.0)
Monocytes Relative: 8.4 % (ref 3.0–12.0)
Neutro Abs: 3.9 10*3/uL (ref 1.4–7.7)
Neutrophils Relative %: 66.7 % (ref 43.0–77.0)
Platelets: 189 10*3/uL (ref 150.0–400.0)
RBC: 4.81 Mil/uL (ref 4.22–5.81)
RDW: 14.2 % (ref 11.5–15.5)
WBC: 5.8 10*3/uL (ref 4.0–10.5)

## 2019-05-17 LAB — POC URINALSYSI DIPSTICK (AUTOMATED)
Bilirubin, UA: NEGATIVE
Blood, UA: NEGATIVE
Glucose, UA: NEGATIVE
Ketones, UA: NEGATIVE
Leukocytes, UA: NEGATIVE
Nitrite, UA: NEGATIVE
Protein, UA: NEGATIVE
Spec Grav, UA: 1.01 (ref 1.010–1.025)
Urobilinogen, UA: 0.2 E.U./dL
pH, UA: 7 (ref 5.0–8.0)

## 2019-05-17 LAB — PSA: PSA: 0.71 ng/mL (ref 0.10–4.00)

## 2019-05-17 LAB — TSH: TSH: 1.62 u[IU]/mL (ref 0.35–4.50)

## 2019-05-17 MED ORDER — METOPROLOL SUCCINATE ER 25 MG PO TB24: 25.0000 mg | ORAL_TABLET | Freq: Every day | ORAL | 3 refills | Status: DC

## 2019-05-17 MED ORDER — IBUPROFEN 800 MG PO TABS: 800.0000 mg | ORAL_TABLET | Freq: Four times a day (QID) | ORAL | 5 refills | Status: DC | PRN

## 2019-05-17 NOTE — Telephone Encounter (Signed)
[name] with [facility] called in to make Dr. Sarajane Jews aware of this pt's chest x-ray report.  I called into Dr. Barbie Banner office and spoke with [phone].    She is going to "see that he gets this report".    Chest x-ray from 05/17/2019.

## 2019-05-17 NOTE — Progress Notes (Signed)
   Subjective:    Patient ID: [name], male    DOB: 09-Apr-1951, 68 y.o.   MRN: [id number]  HPI Here for a well exam. He feels fine. His BP has been running a bit high at home, usually in the 0000000 systolic but it can get above 150 at times. He sees Dr. Junious Silk twice a year for prostate exams.    Review of Systems  Constitutional: Negative.   HENT: Negative.   Eyes: Negative.   Respiratory: Negative.   Cardiovascular: Negative.   Gastrointestinal: Negative.   Genitourinary: Negative.   Musculoskeletal: Negative.   Skin: Negative.   Neurological: Negative.   Psychiatric/Behavioral: Negative.        Objective:   Physical Exam Constitutional:      General: He is not in acute distress.    Appearance: He is well-developed. He is not diaphoretic.  HENT:     Head: Normocephalic and atraumatic.     Right Ear: External ear normal.     Left Ear: External ear normal.     Nose: Nose normal.     Mouth/Throat:     Pharynx: No oropharyngeal exudate.  Eyes:     General: No scleral icterus.       Right eye: No discharge.        Left eye: No discharge.     Conjunctiva/sclera: Conjunctivae normal.     Pupils: Pupils are equal, round, and reactive to light.  Neck:     Musculoskeletal: Neck supple.     Thyroid: No thyromegaly.     Vascular: No JVD.     Trachea: No tracheal deviation.  Cardiovascular:     Rate and Rhythm: Normal rate and regular rhythm.     Heart sounds: Normal heart sounds. No murmur. No friction rub. No gallop.   Pulmonary:     Effort: Pulmonary effort is normal. No respiratory distress.     Breath sounds: Normal breath sounds. No wheezing or rales.  Chest:     Chest wall: No tenderness.  Abdominal:     General: Bowel sounds are normal. There is no distension.     Palpations: Abdomen is soft. There is no mass.     Tenderness: There is no abdominal tenderness. There is no guarding or rebound.  Genitourinary:    Penis: No tenderness.   Musculoskeletal:  Normal range of motion.        General: No tenderness.  Lymphadenopathy:     Cervical: No cervical adenopathy.  Skin:    General: Skin is warm and dry.     Coloration: Skin is not pale.     Findings: No erythema or rash.  Neurological:     Mental Status: He is alert and oriented to person, place, and time.     Cranial Nerves: No cranial nerve deficit.     Motor: No abnormal muscle tone.     Coordination: Coordination normal.     Deep Tendon Reflexes: Reflexes are normal and symmetric. Reflexes normal.  Psychiatric:        Behavior: Behavior normal.        Thought Content: Thought content normal.        Judgment: Judgment normal.           Assessment & Plan:  Well exam. We discussed diet and exercise. Get fasting labs. Get a CXR due to his hx of melanoma.  [name] Penna, MD

## 2019-05-19 NOTE — Addendum Note (Signed)
Addended by: [name] Penna A on: 05/19/2019 08:03 AM   Modules accepted: Orders

## 2019-05-20 ENCOUNTER — Encounter: Payer: Self-pay | Admitting: Family Medicine

## 2019-05-20 NOTE — Telephone Encounter (Signed)
The CT site will contact him about the appt

## 2019-05-20 NOTE — Telephone Encounter (Signed)
Please advise. Will someone contact the patient or is there a phone number I can give them to set up an appointment?

## 2019-05-27 NOTE — Telephone Encounter (Signed)
Please ask Hilda Blades to look into this

## 2019-05-27 NOTE — Telephone Encounter (Signed)
Please advise. I see that the order was placed but it doesn't look like anything has been processed. Should we place a new order?

## 2019-05-31 NOTE — Telephone Encounter (Signed)
Sent to Deborah for assistance.  

## 2019-06-07 ENCOUNTER — Ambulatory Visit (INDEPENDENT_AMBULATORY_CARE_PROVIDER_SITE_OTHER)
Admission: RE | Admit: 2019-06-07 | Discharge: 2019-06-07 | Disposition: A | Discharge status: Home / Self Care | Payer: Medicare Other | Source: Ambulatory Visit | Attending: Family Medicine | Admitting: Family Medicine

## 2019-06-07 ENCOUNTER — Other Ambulatory Visit: Payer: Self-pay

## 2019-06-07 DIAGNOSIS — R9389 Abnormal findings on diagnostic imaging of other specified body structures: Secondary | ICD-10-CM

## 2019-06-07 MED ORDER — IOHEXOL 300 MG/ML  SOLN
80.0000 mL | Freq: Once | INTRAMUSCULAR | Status: AC | PRN
  Administered 2019-06-07: 80 mL via INTRAVENOUS

## 2019-06-15 ENCOUNTER — Ambulatory Visit (INDEPENDENT_AMBULATORY_CARE_PROVIDER_SITE_OTHER): Payer: Medicare Other | Admitting: Family Medicine

## 2019-06-15 ENCOUNTER — Encounter: Payer: Self-pay | Admitting: Family Medicine

## 2019-06-15 ENCOUNTER — Other Ambulatory Visit: Payer: Self-pay

## 2019-06-15 VITALS — BP 120/80 | HR 68 | Temp 98.2°F | Wt 194.0 lb

## 2019-06-15 DIAGNOSIS — R0602 Shortness of breath: Secondary | ICD-10-CM | POA: Diagnosis not present

## 2019-06-15 DIAGNOSIS — R911 Solitary pulmonary nodule: Secondary | ICD-10-CM | POA: Diagnosis not present

## 2019-06-15 DIAGNOSIS — Z23 Encounter for immunization: Secondary | ICD-10-CM | POA: Diagnosis not present

## 2019-06-15 DIAGNOSIS — I2510 Atherosclerotic heart disease of native coronary artery without angina pectoris: Secondary | ICD-10-CM

## 2019-06-15 NOTE — Progress Notes (Signed)
   Subjective:    Patient ID: [name], male    DOB: September 12, 1951, 68 y.o.   MRN: [id number]  HPI Here to go over the results from a recent chest CT. This was ordered to get a better look at an abnormality seen in the right upper lobe on CXR. The CT revealed this to be a benign calcification at the end of a rib. However it also showed a 3 mm nodule in the left lower lobe. In addition it showed "moderate to severe" calcifications in several coronary arteries and in the aorta. [name] has never smoked but he has a hx of melanoma. He denies any chest pain but does have some SOB on exertion.    Review of Systems  Constitutional: Negative.   Respiratory: Positive for shortness of breath. Negative for cough and wheezing.   Cardiovascular: Negative.   Neurological: Negative.        Objective:   Physical Exam Constitutional:      Appearance: Normal appearance.  Cardiovascular:     Rate and Rhythm: Normal rate and regular rhythm.     Pulses: Normal pulses.     Heart sounds: Normal heart sounds.  Pulmonary:     Effort: Pulmonary effort is normal.     Breath sounds: Normal breath sounds.  Neurological:     Mental Status: He is alert.           Assessment & Plan:  For the LLL nodule, we agreed to get a 12 month follow up chest CT to assure stability. For the coronary calcifications and SOB, we will set up a Lexiscan stress test. Dalton Penna, MD

## 2019-06-15 NOTE — Patient Instructions (Signed)
Health Maintenance Due  Topic Date Due  . INFLUENZA VACCINE  04/23/2019    Depression screen [facility] 2/9 05/03/2018 04/29/2017 04/24/2017  Decreased Interest 0 0 1  Down, Depressed, Hopeless 0 0 0  PHQ - 2 Score 0 0 1

## 2019-06-17 ENCOUNTER — Telehealth (HOSPITAL_COMMUNITY): Payer: Self-pay

## 2019-06-17 NOTE — Telephone Encounter (Signed)
Encounter complete. 

## 2019-06-23 ENCOUNTER — Ambulatory Visit (HOSPITAL_COMMUNITY)
Admission: RE | Admit: 2019-06-23 | Discharge: 2019-06-23 | Disposition: A | Discharge status: Home / Self Care | Payer: Medicare Other | Source: Ambulatory Visit | Attending: Cardiology | Admitting: Cardiology

## 2019-06-23 ENCOUNTER — Other Ambulatory Visit: Payer: Self-pay

## 2019-06-23 DIAGNOSIS — R0602 Shortness of breath: Secondary | ICD-10-CM | POA: Diagnosis present

## 2019-06-23 LAB — MYOCARDIAL PERFUSION IMAGING
Peak HR: 72 {beats}/min
Rest HR: 58 {beats}/min
SDS: 2
SRS: 1
SSS: 3
TID: 0.97

## 2019-06-23 MED ORDER — TECHNETIUM TC 99M TETROFOSMIN IV KIT
10.1000 | PACK | Freq: Once | INTRAVENOUS | Status: AC | PRN
  Administered 2019-06-23: 10.1 via INTRAVENOUS
  Filled 2019-06-23: qty 11

## 2019-06-23 MED ORDER — TECHNETIUM TC 99M TETROFOSMIN IV KIT
31.1000 | PACK | Freq: Once | INTRAVENOUS | Status: AC | PRN
  Administered 2019-06-23: 31.1 via INTRAVENOUS
  Filled 2019-06-23: qty 32

## 2019-06-23 MED ORDER — REGADENOSON 0.4 MG/5ML IV SOLN
0.4000 mg | Freq: Once | INTRAVENOUS | Status: AC
  Administered 2019-06-23: 0.4 mg via INTRAVENOUS

## 2019-08-02 ENCOUNTER — Encounter: Payer: Self-pay | Admitting: Family Medicine

## 2019-08-02 ENCOUNTER — Telehealth: Payer: Self-pay | Admitting: Family Medicine

## 2019-08-02 NOTE — Telephone Encounter (Signed)
[name], calling frm [facility]. Saw pt yesterday and did a PAD screening Left leg 1.12 Normal Right Leg 0.4 ABNORMAL  Will send an electronic reading to you but takes 1 to 2 wks for processing, reason for notification [name] contact # [phone]

## 2019-08-02 NOTE — Telephone Encounter (Signed)
FYI

## 2019-08-03 NOTE — Telephone Encounter (Signed)
He already has an appt with Cardiology for this and other issues

## 2019-08-12 ENCOUNTER — Other Ambulatory Visit: Payer: Self-pay

## 2019-08-15 ENCOUNTER — Other Ambulatory Visit: Payer: Self-pay

## 2019-08-15 ENCOUNTER — Ambulatory Visit: Payer: Medicare Other | Admitting: Family Medicine

## 2019-08-15 ENCOUNTER — Encounter: Payer: Self-pay | Admitting: Family Medicine

## 2019-08-15 ENCOUNTER — Other Ambulatory Visit: Payer: Self-pay | Admitting: Family Medicine

## 2019-08-15 VITALS — BP 130/82 | HR 70 | Temp 97.9°F | Wt 193.2 lb

## 2019-08-15 DIAGNOSIS — I10 Essential (primary) hypertension: Secondary | ICD-10-CM | POA: Diagnosis not present

## 2019-08-15 DIAGNOSIS — R040 Epistaxis: Secondary | ICD-10-CM

## 2019-08-15 MED ORDER — LORAZEPAM 1 MG PO TABS: 1.0000 mg | ORAL_TABLET | Freq: Four times a day (QID) | ORAL | 5 refills | Status: DC | PRN

## 2019-08-15 MED ORDER — HYDROCHLOROTHIAZIDE 12.5 MG PO CAPS: 12.5000 mg | ORAL_CAPSULE | Freq: Every day | ORAL | 3 refills | Status: DC

## 2019-08-15 NOTE — Progress Notes (Signed)
   Subjective:    Patient ID: [name], male    DOB: [birth year], 68 y.o.   MRN: [id number]  HPI Here for several issues. First he has been taking Metoprolol in addition to Losartan for HTN, and this has controlled his BP well. However he says it causes him to feel fatigued and even somewhat depressed. He has been more conscious of his diet and he is walking daily, and he has been able to lose 10 lbs in the last few months. Another issue is recurrent nosebleeds from the right nostril for the past 2 weeks. No URI symptoms.    Review of Systems  Constitutional: Positive for fatigue.  HENT: Positive for nosebleeds.   Respiratory: Negative.   Cardiovascular: Negative.   Gastrointestinal: Negative.   Genitourinary: Negative.   Neurological: Negative.        Objective:   Physical Exam Constitutional:      Appearance: Normal appearance.  HENT:     Nose:     Comments: Right nostril has a blot clot sitting on the anterior septum. The left side is clear  Cardiovascular:     Rate and Rhythm: Normal rate and regular rhythm.     Pulses: Normal pulses.     Heart sounds: Normal heart sounds.  Pulmonary:     Effort: Pulmonary effort is normal.     Breath sounds: Normal breath sounds.  Musculoskeletal:     Right lower leg: No edema.     Left lower leg: No edema.  Neurological:     Mental Status: He is alert.           Assessment & Plan:  Although his HTN is well controlled, the Metoprolol is causing side effects so we will stop this. In its place he will take HCTZ 12.5 mg daily. He is scheduled to see Dr. Gwenlyn Found on 08-24-19 for a cardiology evaluation. The nosebleeds are likely the result of dryness in the nose, so he will try saline nasal sprays QID for a few weeks.  [name] Penna, MD

## 2019-08-24 ENCOUNTER — Other Ambulatory Visit: Payer: Self-pay

## 2019-08-24 ENCOUNTER — Encounter: Payer: Self-pay | Admitting: Cardiovascular Disease

## 2019-08-24 ENCOUNTER — Ambulatory Visit: Payer: Medicare Other | Admitting: Cardiovascular Disease

## 2019-08-24 VITALS — BP 133/78 | HR 95 | Temp 97.0°F | Ht 73.0 in | Wt 190.0 lb

## 2019-08-24 DIAGNOSIS — I2510 Atherosclerotic heart disease of native coronary artery without angina pectoris: Secondary | ICD-10-CM

## 2019-08-24 DIAGNOSIS — R0602 Shortness of breath: Secondary | ICD-10-CM | POA: Diagnosis not present

## 2019-08-24 DIAGNOSIS — I2584 Coronary atherosclerosis due to calcified coronary lesion: Secondary | ICD-10-CM | POA: Diagnosis not present

## 2019-08-24 MED ORDER — AMLODIPINE BESYLATE 2.5 MG PO TABS: 2.5000 mg | ORAL_TABLET | Freq: Every day | ORAL | 3 refills | Status: DC

## 2019-08-24 NOTE — Patient Instructions (Signed)
Medication Instructions:  Stop taking Hydrochlorothiazide. Start taking 2.5mg  Amlodipine Daily.  If you need a refill on your cardiac medications before your next appointment, please call your pharmacy.   Lab work: NONE  Testing/Procedures: Coronary Calcium Score  Follow-Up: At [facility], you and your health needs are our priority.  As part of our continuing mission to provide you with exceptional heart care, we have created designated Provider Care Teams.  These Care Teams include your primary Cardiologist (physician) and Advanced Practice Providers (APPs -  Physician Assistants and Nurse Practitioners) who all work together to provide you with the care you need, when you need it. You may see Dr Gwenlyn Found or one of the following Advanced Practice Providers on your designated Care Team:    Kerin Ransom, PA-C  [place], [place]  Coletta Memos, [place] Your physician wants you to follow-up in: 1 year.     Any Other Special Instructions Will Be Listed Below (If Applicable). Keep a 30 day Blood Pressure Log. Our pharmacist will reach out to you in 1 month.

## 2019-08-24 NOTE — Progress Notes (Signed)
08/24/2019 [name]   16-Nov-1950  [id number]  Primary Physician [name], MD Primary Cardiologist: [name] MD Garret Reddish, [place], [place]  HPI:  [name] is a 68 y.o. thin appearing single Caucasian male with no children who was referred by Dr. Sarajane Jews for cardiovascular valuation because of coronary calcification seen on chest CT.  He does have a Hospital doctor at home.  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  His only risk factor is treated hypertension and mild hyperlipidemia.  There is no family history of heart disease.  Never had a heart attack or stroke.  He denies chest pain or shortness of breath.  He did have a chest CT performed 06/07/2019 that showed coronary calcification and a subsequent Myoview stress test performed 06/23/2019 that was nonischemic.   Current Meds  Medication Sig  . cyclobenzaprine (FLEXERIL) 10 MG tablet Take 1 tablet (10 mg total) by mouth at bedtime.  . DULoxetine (CYMBALTA) 20 MG capsule Take 1 capsule (20 mg total) by mouth daily.  . finasteride ([name]) 5 MG tablet Take 1 tablet (5 mg total) by mouth daily.  Marland Kitchen FLUAD 0.5 ML SUSY TO BE ADMINISTERED BY PHARMACIST FOR IMMUNIZATION  . ibuprofen (ADVIL) 800 MG tablet Take 1 tablet (800 mg total) by mouth every 6 (six) hours as needed for moderate pain.  Marland Kitchen LORazepam (ATIVAN) 1 MG tablet Take 1 tablet (1 mg total) by mouth every 6 (six) hours as needed.  Marland Kitchen losartan (COZAAR) 100 MG tablet TAKE 1 TABLET BY MOUTH EVERY DAY  . Melatonin 10 MG TABS Take by mouth as needed.  Marland Kitchen omeprazole (PRILOSEC) 20 MG capsule TAKE 1 CAPSULE (20 MG TOTAL) BY MOUTH 2 (TWO) TIMES DAILY BEFORE A MEAL.  . sildenafil (REVATIO) 20 MG tablet TAKE 2 5 TABLETS BY MOUTH USE AS DIRECTED AS NEEDED  . tamsulosin (FLOMAX) 0.4 MG CAPS capsule Take 0.4 mg by mouth daily.  Marland Kitchen triamcinolone cream (KENALOG) 0.1 % Apply 1 application topically 2 (two) times daily as needed.   . [DISCONTINUED] hydrochlorothiazide (MICROZIDE) 12.5 MG capsule Take 1 capsule (12.5 mg total) by mouth daily.     Allergies  Allergen Reactions  . Codeine Other (See Comments)    hallucinations  . Lisinopril Cough    Social History   Socioeconomic History  . Marital status: Single    Spouse name: Not on file  . Number of children: Not on file  . Years of education: Not on file  . Highest education level: Not on file  Occupational History  . Occupation: retired    Comment: Pharmacist, hospital  Social Needs  . Financial resource strain: Not on file  . Food insecurity    Worry: Not on file    Inability: Not on file  . Transportation needs    Medical: Not on file    Non-medical: Not on file  Tobacco Use  . Smoking status: Never Smoker  . Smokeless tobacco: Never Used  Substance and Sexual Activity  . Alcohol use: No    Alcohol/week: 0.0 standard drinks  . Drug use: No  . Sexual activity: Not on file  Lifestyle  . Physical activity    Days per week: Not on file    Minutes per session: Not on file  . Stress: Not on file  Relationships  . Social Herbalist on phone: Not on file    Gets together: Not  on file    Attends religious service: Not on file    Active member of club or organization: Not on file    Attends meetings of clubs or organizations: Not on file    Relationship status: Not on file  . Intimate partner violence    Fear of current or ex partner: Not on file    Emotionally abused: Not on file    Physically abused: Not on file    Forced sexual activity: Not on file  Other Topics Concern  . Not on file  Social History Narrative  . Not on file     Review of Systems: General: negative for chills, fever, night sweats or weight changes.  Cardiovascular: negative for chest pain, dyspnea on exertion, edema, orthopnea, palpitations, paroxysmal nocturnal dyspnea or shortness of breath Dermatological: negative for rash Respiratory: negative for cough or  wheezing Urologic: negative for hematuria Abdominal: negative for nausea, vomiting, diarrhea, bright red blood per rectum, melena, or hematemesis Neurologic: negative for visual changes, syncope, or dizziness All other systems reviewed and are otherwise negative except as noted above.    Blood pressure 133/78, pulse 95, temperature (!) 97 F (36.1 C), height 6\' 1"  (1.854 m), weight 190 lb (86.2 kg), SpO2 99 %.  General appearance: alert and no distress Neck: no adenopathy, no carotid bruit, no JVD, supple, symmetrical, trachea midline and thyroid not enlarged, symmetric, no tenderness/mass/nodules Lungs: clear to auscultation bilaterally Heart: regular rate and rhythm, S1, S2 normal, no murmur, click, rub or gallop Extremities: extremities normal, atraumatic, no cyanosis or edema Pulses: 2+ and symmetric Skin: Skin color, texture, turgor normal. No rashes or lesions Neurologic: Alert and oriented X 3, normal strength and tone. Normal symmetric reflexes. Normal coordination and gait  EKG sinus rhythm at 94 without ST or T wave changes.  I personally reviewed this EKG.  ASSESSMENT AND PLAN:   Hyperlipemia, mixed History of hyperlipidemia not on statin therapy with lipid profile performed 05/17/2019 revealing a total cholesterol of 206, LDL of 124 and HDL 54.  Essential hypertension History of essential hypertension with blood pressure measured today at 133/78.  He does check his blood pressure daily at home.  He is on losartan hydrochlorothiazide.  He thinks that the hydrochlorothiazide is causing constipation.  Good but going to discontinue this, start low-dose amlodipine.  I will have him keep a blood pressure log for the next 30 days and will have Kristen call him in 4 weeks to review make appropriate modifications.  Coronary artery calcification seen on CAT scan Recent chest CT performed 06/07/2019 showed coronary calcifications.  The patient is completely asymptomatic.  He walks 1-1/2  miles a day.  He had a Myoview stress test performed 06/23/2019 which was nonischemic.  I am going to get a coronary calcium score to further quantitate the degree of calcification.  If this is elevated will recommend beginning a statin drug to address his elevated LDL.      [name] MD FACP,FACC,FAHA, [facility] 08/24/2019 9:58 AM

## 2019-08-24 NOTE — Assessment & Plan Note (Signed)
Recent chest CT performed 06/07/2019 showed coronary calcifications.  The patient is completely asymptomatic.  He walks 1-1/2 miles a day.  He had a Myoview stress test performed 06/23/2019 which was nonischemic.  I am going to get a coronary calcium score to further quantitate the degree of calcification.  If this is elevated will recommend beginning a statin drug to address his elevated LDL.

## 2019-08-24 NOTE — Assessment & Plan Note (Signed)
History of hyperlipidemia not on statin therapy with lipid profile performed 05/17/2019 revealing a total cholesterol of 206, LDL of 124 and HDL 54.

## 2019-08-24 NOTE — Assessment & Plan Note (Signed)
History of essential hypertension with blood pressure measured today at 133/78.  He does check his blood pressure daily at home.  He is on losartan hydrochlorothiazide.  He thinks that the hydrochlorothiazide is causing constipation.  Good but going to discontinue this, start low-dose amlodipine.  I will have him keep a blood pressure log for the next 30 days and will have Kristen call him in 4 weeks to review make appropriate modifications.

## 2019-09-09 ENCOUNTER — Ambulatory Visit (INDEPENDENT_AMBULATORY_CARE_PROVIDER_SITE_OTHER)
Admission: RE | Admit: 2019-09-09 | Discharge: 2019-09-09 | Disposition: A | Discharge status: Home / Self Care | Payer: Self-pay | Source: Ambulatory Visit | Attending: Cardiovascular Disease | Admitting: Cardiovascular Disease

## 2019-09-09 ENCOUNTER — Other Ambulatory Visit: Payer: Self-pay

## 2019-09-09 DIAGNOSIS — I2584 Coronary atherosclerosis due to calcified coronary lesion: Secondary | ICD-10-CM

## 2019-09-09 DIAGNOSIS — R0602 Shortness of breath: Secondary | ICD-10-CM

## 2019-09-09 DIAGNOSIS — I2510 Atherosclerotic heart disease of native coronary artery without angina pectoris: Secondary | ICD-10-CM

## 2019-09-13 ENCOUNTER — Telehealth: Payer: Self-pay | Admitting: *Deleted

## 2019-09-13 DIAGNOSIS — R931 Abnormal findings on diagnostic imaging of heart and coronary circulation: Secondary | ICD-10-CM

## 2019-09-13 MED ORDER — ATORVASTATIN CALCIUM 40 MG PO TABS: 40.0000 mg | ORAL_TABLET | Freq: Every day | ORAL | 3 refills | Status: DC

## 2019-09-13 NOTE — Telephone Encounter (Signed)
-----   Message from [name], MD sent at 09/12/2019  7:40 AM EST ----- Cor CA Score 1100. Start Atorva 40 mg . Re checl FLP 2 mo.

## 2019-09-20 ENCOUNTER — Other Ambulatory Visit: Payer: Self-pay | Admitting: Family Medicine

## 2019-09-21 ENCOUNTER — Telehealth: Payer: Self-pay | Admitting: Cardiovascular Disease

## 2019-09-21 NOTE — Telephone Encounter (Signed)
Patient states he messed up his BP log. He didn't realize that he needed to tae his BP both morning and night. He filled up the first colum by taking the reading once a day. He did not realize he needed to take it twice daily until starting the second column. He wants to know if he will need to postpone his appt on 09-27-19 with the Pharmacist or if he is still OK to come in on that day.

## 2019-09-21 NOTE — Telephone Encounter (Signed)
Patient advised OK that he has not checked BP twice daily but to check from now until 09/27/2019 visit two times each day. He voiced understanding.

## 2019-09-27 ENCOUNTER — Ambulatory Visit (INDEPENDENT_AMBULATORY_CARE_PROVIDER_SITE_OTHER): Payer: Medicare PPO | Admitting: Pharmacist Clinician (PhC)/ Clinical Pharmacy Specialist

## 2019-09-27 ENCOUNTER — Other Ambulatory Visit: Payer: Self-pay

## 2019-09-27 DIAGNOSIS — E782 Mixed hyperlipidemia: Secondary | ICD-10-CM | POA: Diagnosis not present

## 2019-09-27 DIAGNOSIS — I10 Essential (primary) hypertension: Secondary | ICD-10-CM

## 2019-09-27 MED ORDER — AMLODIPINE BESYLATE 5 MG PO TABS: 5.0000 mg | ORAL_TABLET | Freq: Every day | ORAL | 3 refills | Status: DC

## 2019-09-27 NOTE — Patient Instructions (Addendum)
Return for a a follow up appointment in 6 weeks  Call if your have continued muscle aches - [phone] (Jonay Hitchcock/Raquel)  Your blood pressure today is 152/88   Check your blood pressure at home once or twice daily and keep record of the readings.  Take your BP meds as follows:  Losartan 100 mg each morning  Amlodipine 5 mg each evening/afternoon (take 2 of the 2.5 mg tabs until gone)  Bring all of your meds, your BP cuff and your record of home blood pressures to your next appointment.  Exercise as you're able, try to walk approximately 30 minutes per day.  Keep salt intake to a minimum, especially watch canned and prepared boxed foods.  Eat more fresh fruits and vegetables and fewer canned items.  Avoid eating in fast food restaurants.    HOW TO TAKE YOUR BLOOD PRESSURE: . Rest 5 minutes before taking your blood pressure. .  Don't smoke or drink caffeinated beverages for at least 30 minutes before. . Take your blood pressure before (not after) you eat. . Sit comfortably with your back supported and both feet on the floor (don't cross your legs). . Elevate your arm to heart level on a table or a desk. . Use the proper sized cuff. It should fit smoothly and snugly around your bare upper arm. There should be enough room to slip a fingertip under the cuff. The bottom edge of the cuff should be 1 inch above the crease of the elbow. . Ideally, take 3 measurements at one sitting and record the average.

## 2019-09-27 NOTE — Progress Notes (Signed)
09/27/2019 [name] Jun 20, 1951 [id number]   HPI:  [name] is a 69 y.o. male patient of Dr [name], with a PMH below who presents today for hypertension clinic evaluation.  In addition to hypertension, his medical history is significant only for hyperlipidemia, BPH, osteoporosis and degenerative joint issues. He was seen by Dr. Gwenlyn [name] in early December and [name] to have a blood pressure of 133/78.  At that time he was taking hydrochlorothiazide 12.5 mg, which he believed was causing him constipation.  He was switched to amlodipine 2.5 mg and returns today for follow up.  Since his last visit, he notes feeling well overall.  He continues to have some exertional tightness across his chest, but finds it always dissipates with rest.  He has also recently noticed some aches in his thighs after walking, not sure if this is related to his atorvastatin or not.      lipitor starting to cause some upper thigh pains/aches  Blood Pressure Goal:  130/80  Current Medications: amlodipine 2.5 mg qd, losartan 100 mg qd (both in am)  Family Hx:  Sister doesn't go to MDs  Mother side cancer, mother had alzheimers  Father died in his early 18's, alcoholoism, estranged  No children  Social Hx:  No children, seldom/rare alcohol, coffee in the mornings (half decaf), no soda  Diet: cooks at home, no added salt; enjoys vegetables, (broccoli/ cauliflower), likes [place], chicken, bakes most foods; salads  Exercise: daily walks about 1.5 miles  Home BP readings: home cuff about 6 months, arm cuff [employer] brand  Intolerances: metoprolol caused fatigue  Labs:  04/2019:  Na 135, K 4.5, Glu 135, BUN 19, SCr 1.13  Wt Readings from Last 3 Encounters:  08/24/19 190 lb (86.2 kg)  08/15/19 193 lb 3.2 oz (87.6 kg)  06/23/19 194 lb (88 kg)   BP Readings from Last 3 Encounters:  09/27/19 (!) 152/88  08/24/19 133/78  08/15/19 130/82   Pulse Readings from Last 3 Encounters:  09/27/19 72  08/24/19  95  08/15/19 70    Current Outpatient Medications  Medication Sig Dispense Refill  . atorvastatin (LIPITOR) 40 MG tablet Take 1 tablet (40 mg total) by mouth daily. 90 tablet 3  . cyclobenzaprine (FLEXERIL) 10 MG tablet Take 1 tablet (10 mg total) by mouth at bedtime. 150 tablet 3  . DULoxetine (CYMBALTA) 20 MG capsule Take 1 capsule (20 mg total) by mouth daily. 30 capsule 11  . finasteride ([name]) 5 MG tablet Take 1 tablet (5 mg total) by mouth daily. 1 tablet 0  . ibuprofen (ADVIL) 800 MG tablet Take 1 tablet (800 mg total) by mouth every 6 (six) hours as needed for moderate pain. 60 tablet 5  . LORazepam (ATIVAN) 1 MG tablet Take 1 tablet (1 mg total) by mouth every 6 (six) hours as needed. 120 tablet 5  . losartan (COZAAR) 100 MG tablet TAKE 1 TABLET BY MOUTH EVERY DAY 90 tablet 3  . Melatonin 10 MG TABS Take by mouth as needed.    Marland Kitchen omeprazole (PRILOSEC) 20 MG capsule TAKE 1 CAPSULE (20 MG TOTAL) BY MOUTH 2 (TWO) TIMES DAILY BEFORE A MEAL. 60 capsule 11  . sildenafil (REVATIO) 20 MG tablet TAKE 2 5 TABLETS BY MOUTH USE AS DIRECTED AS NEEDED    . tamsulosin (FLOMAX) 0.4 MG CAPS capsule Take 0.4 mg by mouth daily.    Marland Kitchen triamcinolone cream (KENALOG) 0.1 % Apply 1 application topically 2 (two) times daily as needed.    Marland Kitchen  amLODipine (NORVASC) 5 MG tablet Take 1 tablet (5 mg total) by mouth daily. 90 tablet 3  . FLUAD 0.5 ML SUSY TO BE ADMINISTERED BY PHARMACIST FOR IMMUNIZATION  0   No current facility-administered medications for this visit.    Allergies  Allergen Reactions  . Codeine Other (See Comments)    hallucinations  . Lisinopril Cough    Past Medical History:  Diagnosis Date  . Anxiety   . Arthritis   . Cancer ([facility]) 2010   basal cell ca, head  . Cataract    bilateral  . Depression   . ED (erectile dysfunction)   . GERD (gastroesophageal reflux disease)   . Hyperlipidemia   . Hypertension   . Kidney stone 07-29-11   passed   . Neuromuscular disorder ([facility])     neuropathy legs  . Vitreous floater    left eye, sees Dr. Dawna Part at [facility]     Blood pressure (!) 152/88, pulse 72. (right arm 148/84)  Essential hypertension Patient with essential hypertension, not at goal in the office today.  While his home readings average 132 overall in the past month, for the past week, he has averaged 145 just in the past week.  Will have him increase the amlodipine to 5 mg daily and move this to take in the late afternoons with his cholesterol medication.  He was given instruction on proper BP cuff technique and all questions were answered.  We will see him back in 6 weeks for follow up.    Hyperlipemia, mixed Patient currently on atorvastatin 40 mg daily.  Has noted recent weakness/aching in his thighs, not sure if due to atorvastatin at this time.  Would like to continue for now.  Understands that should this worsen, he can call the office and we can try switching him to rosuvastatin.     [name] PharmD CPP [facility] [address] [place], [place] [address] [phone]

## 2019-09-27 NOTE — Assessment & Plan Note (Signed)
Patient currently on atorvastatin 40 mg daily.  Has noted recent weakness/aching in his thighs, not sure if due to atorvastatin at this time.  Would like to continue for now.  Understands that should this worsen, he can call the office and we can try switching him to rosuvastatin.

## 2019-09-27 NOTE — Assessment & Plan Note (Signed)
Patient with essential hypertension, not at goal in the office today.  While his home readings average 132 overall in the past month, for the past week, he has averaged 145 just in the past week.  Will have him increase the amlodipine to 5 mg daily and move this to take in the late afternoons with his cholesterol medication.  He was given instruction on proper BP cuff technique and all questions were answered.  We will see him back in 6 weeks for follow up.

## 2019-10-04 ENCOUNTER — Telehealth: Payer: Self-pay | Admitting: Pharmacist Clinician (PhC)/ Clinical Pharmacy Specialist

## 2019-10-04 MED ORDER — ROSUVASTATIN CALCIUM 5 MG PO TABS: ORAL_TABLET | ORAL | 3 refills | Status: DC

## 2019-10-04 NOTE — Telephone Encounter (Signed)
Patient called to report that he has been feeling worse in the past week.  We increased the amlodipine from 2.5 to 5 mg daily at his   appointment last week.  He also takes atorvastatin 40 mg.  At his appointment he complained about chest tightness and muscle aches with exercise.  He read that these are side effects of both the amlodipine and atorvastatin, and he believes the combination is causing him issues.  He also complains of problems with finding words.    Advised patient to stop atorvastatin for now.  Continue with amlodipine 5 mg daily.  Explained that it may take 2-3 weeks for aches to completely go away.  Will have him start rosuvastatin 5 mg twice weekly starting Jan 25.  If after 4 weeks he is tolerating this, we can titrate to three times weekly and upward as tolerated.   Patient agreeable to this plan.

## 2019-10-11 ENCOUNTER — Other Ambulatory Visit: Payer: Self-pay

## 2019-10-12 ENCOUNTER — Encounter: Payer: Self-pay | Admitting: Family Medicine

## 2019-10-12 ENCOUNTER — Ambulatory Visit: Payer: Medicare PPO | Admitting: Family Medicine

## 2019-10-12 VITALS — BP 130/70 | HR 79 | Temp 97.3°F | Wt 191.4 lb

## 2019-10-12 DIAGNOSIS — R739 Hyperglycemia, unspecified: Secondary | ICD-10-CM

## 2019-10-12 DIAGNOSIS — F411 Generalized anxiety disorder: Secondary | ICD-10-CM | POA: Diagnosis not present

## 2019-10-12 LAB — POCT GLYCOSYLATED HEMOGLOBIN (HGB A1C): Hemoglobin A1C: 5.6 % (ref 4.0–5.6)

## 2019-10-12 MED ORDER — LORAZEPAM 2 MG PO TABS: 1.0000 mg | ORAL_TABLET | Freq: Four times a day (QID) | ORAL | 5 refills | Status: DC | PRN

## 2019-10-12 NOTE — Addendum Note (Signed)
Addended by: Rebecca Eaton on: 10/12/2019 04:07 PM   Modules accepted: Orders

## 2019-10-12 NOTE — Progress Notes (Signed)
   Subjective:    Patient ID: [name], male    DOB: 1951-01-05, 69 y.o.   MRN: [id number]  HPI Here to follow up on hyperglycemia and on anxiety. At his last well exam in August his fasting glucose was 108 and I thad been 111 the year before. Since then he has made positive changes in his diet to reduce his consumption of sugars and carbohydrates. His anxiety had been well controled by taking Lorazepam 1 mg 4 times daily. However when he recently tried to refill this he was told that his insurance company has now limited this medication to a total of only 90 tablets a month.    Review of Systems  Constitutional: Negative.   Respiratory: Negative.   Cardiovascular: Negative.   Psychiatric/Behavioral: Negative for dysphoric mood. The patient is nervous/anxious.        Objective:   Physical Exam Constitutional:      Appearance: Normal appearance.  Cardiovascular:     Rate and Rhythm: Normal rate and regular rhythm.     Pulses: Normal pulses.     Heart sounds: Normal heart sounds.  Pulmonary:     Effort: Pulmonary effort is normal.     Breath sounds: Normal breath sounds.  Neurological:     Mental Status: He is alert.  Psychiatric:        Mood and Affect: Mood normal.        Thought Content: Thought content normal.           Assessment & Plan:  His A1c today is 5.6, so his glucoses are now very well controlled. For the anxiety, we will change the Lorazepam to taking 1/2 tablet of 2 mg tablets every 6 hours so his monthly total will only be 60 tablets.  [name] Penna, MD

## 2019-10-25 ENCOUNTER — Other Ambulatory Visit: Payer: Self-pay

## 2019-10-25 ENCOUNTER — Encounter: Payer: Self-pay | Admitting: Cardiovascular Disease

## 2019-10-25 ENCOUNTER — Ambulatory Visit: Payer: Medicare PPO | Admitting: Cardiovascular Disease

## 2019-10-25 DIAGNOSIS — R0789 Other chest pain: Secondary | ICD-10-CM | POA: Diagnosis not present

## 2019-10-25 DIAGNOSIS — E782 Mixed hyperlipidemia: Secondary | ICD-10-CM | POA: Diagnosis not present

## 2019-10-25 DIAGNOSIS — R0602 Shortness of breath: Secondary | ICD-10-CM

## 2019-10-25 MED ORDER — METOPROLOL TARTRATE 100 MG PO TABS: ORAL_TABLET | ORAL | 0 refills | Status: DC

## 2019-10-25 NOTE — Progress Notes (Signed)
10/25/2019 [name]   02/12/1951  [id number]  Primary Physician Dalton Morale, MD Primary Cardiologist: Dalton Harp MD [name], [name], [place]  HPI:  [name] is a 69 y.o.  thin appearing single Caucasian male with no children who was referred by Dr. Sarajane [name] for cardiovascular valuation because of coronary calcification seen on chest CT.   I last saw him in the office 08/24/2019.  He does have a Hospital doctor at home.  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  His only risk factor is treated hypertension and mild hyperlipidemia.  There is no family history of heart disease.  Never had a heart attack or stroke.  He denies chest pain or shortness of breath.  He did have a chest CT performed 06/07/2019 that showed coronary calcification and a subsequent Myoview stress test performed 06/23/2019 that was nonischemic.  Since I saw him 2 months ago he has developed some substernal chest pressure occurring after doing chores lasting for hours at a time.  His coronary calcium score performed 09/09/2019 was 1107 with calcification noted in the LAD and RCA.   Current Meds  Medication Sig  . amLODipine (NORVASC) 5 MG tablet Take 1 tablet (5 mg total) by mouth daily.  . cyclobenzaprine (FLEXERIL) 10 MG tablet Take 1 tablet (10 mg total) by mouth at bedtime.  . DULoxetine (CYMBALTA) 20 MG capsule Take 1 capsule (20 mg total) by mouth daily.  . finasteride ([name]) 5 MG tablet Take 1 tablet (5 mg total) by mouth daily.  Marland Kitchen FLUAD 0.5 ML SUSY TO BE ADMINISTERED BY PHARMACIST FOR IMMUNIZATION  . LORazepam (ATIVAN) 2 MG tablet Take 0.5 tablets (1 mg total) by mouth every 6 (six) hours as needed for anxiety.  Marland Kitchen losartan (COZAAR) 100 MG tablet TAKE 1 TABLET BY MOUTH EVERY DAY  . Melatonin 10 MG TABS Take by mouth as needed.  Marland Kitchen omeprazole (PRILOSEC) 20 MG capsule TAKE 1 CAPSULE (20 MG TOTAL) BY MOUTH 2 (TWO) TIMES DAILY BEFORE A MEAL.  .  rosuvastatin (CRESTOR) 5 MG tablet Take 1 tablet by mouth 2-3 times weekly as tolerated  . sildenafil (REVATIO) 20 MG tablet TAKE 2 5 TABLETS BY MOUTH USE AS DIRECTED AS NEEDED  . tamsulosin (FLOMAX) 0.4 MG CAPS capsule Take 0.4 mg by mouth daily.  Marland Kitchen triamcinolone cream (KENALOG) 0.1 % Apply 1 application topically 2 (two) times daily as needed.     Allergies  Allergen Reactions  . Codeine Other (See Comments)    hallucinations  . Lisinopril Cough    Social History   Socioeconomic History  . Marital status: Single    Spouse name: Not on file  . Number of children: Not on file  . Years of education: Not on file  . Highest education level: Not on file  Occupational History  . Occupation: retired    Comment: Pharmacist, hospital  Tobacco Use  . Smoking status: Never Smoker  . Smokeless tobacco: Never Used  Substance and Sexual Activity  . Alcohol use: No    Alcohol/week: 0.0 standard drinks  . Drug use: No  . Sexual activity: Not on file  Other Topics Concern  . Not on file  Social History Narrative  . Not on file   Social Determinants of Health   Financial Resource Strain:   . Difficulty of Paying Living Expenses: Not on file  Food Insecurity:   . Worried About [employer] of  Food in the Last Year: Not on file  . Ran Out of Food in the Last Year: Not on file  Transportation Needs:   . Lack of Transportation (Medical): Not on file  . Lack of Transportation (Non-Medical): Not on file  Physical Activity:   . Days of Exercise per Week: Not on file  . Minutes of Exercise per Session: Not on file  Stress:   . Feeling of Stress : Not on file  Social Connections:   . Frequency of Communication with Friends and Family: Not on file  . Frequency of Social Gatherings with Friends and Family: Not on file  . Attends Religious Services: Not on file  . Active Member of Clubs or Organizations: Not on file  . Attends Archivist Meetings: Not on file  . Marital Status: Not on file   Intimate Partner Violence:   . Fear of Current or Ex-Partner: Not on file  . Emotionally Abused: Not on file  . Physically Abused: Not on file  . Sexually Abused: Not on file     Review of Systems: General: negative for chills, fever, night sweats or weight changes.  Cardiovascular: negative for chest pain, dyspnea on exertion, edema, orthopnea, palpitations, paroxysmal nocturnal dyspnea or shortness of breath Dermatological: negative for rash Respiratory: negative for cough or wheezing Urologic: negative for hematuria Abdominal: negative for nausea, vomiting, diarrhea, bright red blood per rectum, melena, or hematemesis Neurologic: negative for visual changes, syncope, or dizziness All other systems reviewed and are otherwise negative except as noted above.    Blood pressure 134/73, pulse 80, height 6\' 1"  (1.854 m), weight 190 lb 9.6 oz (86.5 kg), SpO2 99 %.  General appearance: alert and no distress Neck: no adenopathy, no carotid bruit, no JVD, supple, symmetrical, trachea midline and thyroid not enlarged, symmetric, no tenderness/mass/nodules Lungs: clear to auscultation bilaterally Heart: regular rate and rhythm, S1, S2 normal, no murmur, click, rub or gallop Extremities: extremities normal, atraumatic, no cyanosis or edema Pulses: 2+ and symmetric Skin: Skin color, texture, turgor normal. No rashes or lesions Neurologic: Alert and oriented X 3, normal strength and tone. Normal symmetric reflexes. Normal coordination and gait  EKG not performed today  ASSESSMENT AND PLAN:   Atypical chest pain [name] has developed some atypical chest pain since I saw him 2 months ago.  The pain is a pressure pressure-like sensation usually occurring after doing chores lasting for hours at a time.  He did have a coronary calcium score however performed 09/09/2019 revealing the score of 1107 with calcification in the LAD and RCA.  Based on this, I am going to get a formal coronary CTA to  further evaluate.  Hyperlipemia, mixed Hyperlipidemia with an elevated coronary calcium score.  He did not tolerate atorvastatin.  Currently he is on Crestor 5 mg 3 times a week.  We will recheck a lipid liver profile in 2 months.      Dalton Harp MD [place], [facility] 10/25/2019 2:38 PM

## 2019-10-25 NOTE — Assessment & Plan Note (Signed)
Hyperlipidemia with an elevated coronary calcium score.  He did not tolerate atorvastatin.  Currently he is on Crestor 5 mg 3 times a week.  We will recheck a lipid liver profile in 2 months.

## 2019-10-25 NOTE — Patient Instructions (Signed)
Medication Instructions:  Take 100mg  Metoprolol 2 hours prior to Coronary CTA  If you need a refill on your cardiac medications before your next appointment, please call your pharmacy.   Lab work: BMET If you have labs (blood work) drawn today and your tests are completely normal, you will receive your results only by: [place] (if you have MyChart) OR A paper copy in the mail If you have any lab test that is abnormal or we need to change your treatment, we will call you to review the results.  Testing/Procedures: Non-Cardiac CT scanning, (CAT scanning), is a noninvasive, special x-ray that produces cross-sectional images of the body using x-rays and a computer. CT scans help physicians diagnose and treat medical conditions. For some CT exams, a contrast material is used to enhance visibility in the area of the body being studied. CT scans provide greater clarity and reveal more details than regular x-ray exams.  Follow-Up: At [facility], you and your health needs are our priority.  As part of our continuing mission to provide you with exceptional heart care, we have created designated Provider Care Teams.  These Care Teams include your primary Cardiologist (physician) and Advanced Practice Providers (APPs -  Physician Assistants and Nurse Practitioners) who all work together to provide you with the care you need, when you need it. You may see Dr. Gwenlyn Found or one of the following Advanced Practice Providers on your designated Care Team:    Kerin Ransom, PA-C  [place], [place]  Coletta Memos, [place]  Your physician wants you to follow-up in: 3 months with Dr. Gwenlyn Found  Any Other Special Instructions Will Be Listed Below (If Applicable).  Your cardiac CT will be scheduled at:   [facility] [address] [place], [place] [address] [phone]   If scheduled at [facility], please arrive at the [facility] main entrance of [facility] 30-45  minutes prior to test start time. Proceed to the [facility] Radiology Department (first floor) to check-in and test prep.  Please follow these instructions carefully (unless otherwise directed):  Hold all erectile dysfunction medications at least 3 days (72 hrs) prior to test.  On the Night Before the Test: . Be sure to Drink plenty of water. . Do not consume any caffeinated/decaffeinated beverages or chocolate 12 hours prior to your test. . Do not take any antihistamines 12 hours prior to your test. On the Day of the Test: . Drink plenty of water. Do not drink any water within one hour of the test. . Do not eat any food 4 hours prior to the test. . You may take your regular medications prior to the test.  . Take 100mg  metoprolol (Lopressor) two hours prior to test. . HOLD Furosemide/Hydrochlorothiazide morning of the test.      After the Test: . Drink plenty of water. . After receiving IV contrast, you may experience a mild flushed feeling. This is normal. . On occasion, you may experience a mild rash up to 24 hours after the test. This is not dangerous. If this occurs, you can take Benadryl 25 mg and increase your fluid intake. . If you experience trouble breathing, this can be serious. If it is severe call 911 IMMEDIATELY. If it is mild, please call our office. . If you take any of these medications: Glipizide/Metformin, Avandament, Glucavance, please do not take 48 hours after completing test unless otherwise instructed.   Once we have confirmed authorization from [employer],  we will call you to set up a date and time for your test.   For non-scheduling related questions, please contact the cardiac imaging nurse navigator should you have any questions/concerns: Marchia Bond, RN Navigator Cardiac Imaging [facility] [phone] Office

## 2019-10-25 NOTE — Assessment & Plan Note (Signed)
[name] has developed some atypical chest pain since I saw him 2 months ago.  The pain is a pressure pressure-like sensation usually occurring after doing chores lasting for hours at a time.  He did have a coronary calcium score however performed 09/09/2019 revealing the score of 1107 with calcification in the LAD and RCA.  Based on this, I am going to get a formal coronary CTA to further evaluate.

## 2019-10-26 LAB — BASIC METABOLIC PANEL
BUN/Creatinine Ratio: 13 (ref 10–24)
BUN: 14 mg/dL (ref 8–27)
CO2: 25 mmol/L (ref 20–29)
Calcium: 9.5 mg/dL (ref 8.6–10.2)
Chloride: 99 mmol/L (ref 96–106)
Creatinine, Ser: 1.08 mg/dL (ref 0.76–1.27)
GFR calc Af Amer: 81 mL/min/{1.73_m2} (ref >59)
GFR calc non Af Amer: 70 mL/min/{1.73_m2} (ref >59)
Glucose: 89 mg/dL (ref 65–99)
Potassium: 5 mmol/L (ref 3.5–5.2)
Sodium: 139 mmol/L (ref 134–144)

## 2019-10-31 ENCOUNTER — Ambulatory Visit: Payer: Medicare PPO | Attending: Internal Medicine

## 2019-10-31 DIAGNOSIS — Z23 Encounter for immunization: Secondary | ICD-10-CM | POA: Insufficient documentation

## 2019-10-31 NOTE — Progress Notes (Signed)
   Covid-19 Vaccination Clinic  Name:  [name]    MRN: [id number] DOB: 1951/03/25  10/31/2019  [name] was observed post Covid-19 immunization for 15 minutes without incidence. He was provided with Vaccine Information Sheet and instruction to access the V-Safe system.   [name] was instructed to call 911 with any severe reactions post vaccine: Marland Kitchen Difficulty breathing  . Swelling of your face and throat  . A fast heartbeat  . A bad rash all over your body  . Dizziness and weakness    Immunizations Administered    Name Date Dose VIS Date Route   Pfizer COVID-19 Vaccine 10/31/2019 11:37 AM 0.3 mL 09/02/2019 Intramuscular   Manufacturer: [place]   Lot: [id number]   [place]: [id number]

## 2019-11-08 ENCOUNTER — Encounter: Payer: Self-pay | Admitting: Pharmacist Clinician (PhC)/ Clinical Pharmacy Specialist

## 2019-11-08 ENCOUNTER — Ambulatory Visit (INDEPENDENT_AMBULATORY_CARE_PROVIDER_SITE_OTHER): Payer: Medicare PPO | Admitting: Pharmacist Clinician (PhC)/ Clinical Pharmacy Specialist

## 2019-11-08 ENCOUNTER — Other Ambulatory Visit: Payer: Self-pay

## 2019-11-08 DIAGNOSIS — I10 Essential (primary) hypertension: Secondary | ICD-10-CM

## 2019-11-08 NOTE — Patient Instructions (Signed)
  Check your blood pressure at home 3-4 times per week and keep record of the readings.  Take your BP meds as follows:  Continue with all current medications  Bring all of your meds, your BP cuff and your record of home blood pressures to your next appointment.  Exercise as you're able, try to walk approximately 30 minutes per day.  Keep salt intake to a minimum, especially watch canned and prepared boxed foods.  Eat more fresh fruits and vegetables and fewer canned items.  Avoid eating in fast food restaurants.    HOW TO TAKE YOUR BLOOD PRESSURE: . Rest 5 minutes before taking your blood pressure. .  Don't smoke or drink caffeinated beverages for at least 30 minutes before. . Take your blood pressure before (not after) you eat. . Sit comfortably with your back supported and both feet on the floor (don't cross your legs). . Elevate your arm to heart level on a table or a desk. . Use the proper sized cuff. It should fit smoothly and snugly around your bare upper arm. There should be enough room to slip a fingertip under the cuff. The bottom edge of the cuff should be 1 inch above the crease of the elbow. . Ideally, take 3 measurements at one sitting and record the average.

## 2019-11-08 NOTE — Assessment & Plan Note (Signed)
Patient with essential hypertension, now fairly well controlled on amlodipine and losartan.  He should continue with current medications and continue monitoring home BP readings 3-4 times per week.  Asked that he calculate weekly averages and if they run > [id number] systolic, he would need to contact the office.

## 2019-11-08 NOTE — Progress Notes (Signed)
11/08/2019 [place] Lafazia 1951-07-17 [id number]   HPI:  [name] is a 69 y.o. male patient of Dr Gwenlyn Found, with a PMH below who presents today for hypertension clinic evaluation.  In addition to hypertension, his medical history is significant only for hyperlipidemia, BPH, osteoporosis and degenerative joint issues. He was seen by Dr. Gwenlyn Found in early December and found to have a blood pressure of 133/78.  At that time he was taking hydrochlorothiazide 12.5 mg, which he believed was causing him constipation.  He was switched to amlodipine 2.5 mg, which was then later increased to 5 mg at his last CVRR appointment.  He returns today for follow up.   He reports to feeling well overall.  He did see Dr. Gwenlyn Found a few weeks back because of some atypical chest pain.  He noted it mainly with exertion, but always relieved with rest.  Lately he has taken to doing his household chores and other activities in short bursts, to prevent the pain from appearing.  He is now scheduled for a cardiac CT in March and will follow up with Dr. Gwenlyn Found afterwards.   His BP when in to see Dr. Gwenlyn Found was 134/73.  Blood Pressure Goal:  130/80  Current Medications: amlodipine 5 mg qd (pm), losartan 100 mg qd (am)  Family Hx:  Sister doesn't go to MDs  Mother side cancer, mother had alzheimers  Father died in his early 64's, alcoholoism, estranged  No children  Social Hx:  No children, seldom/rare alcohol, coffee in the mornings (half decaf), no soda  Diet: cooks at home, no added salt; enjoys vegetables, (broccoli/ cauliflower), likes [place], chicken, bakes most foods; salads  Exercise: daily walks about 1.5 miles  Home BP readings: home cuff about 6 months, arm cuff [employer] brand, read within 10 points of office readings.  Home average for last 20 days was:  AM (20 readings) 135/78, PM (12 readings) 122/70  Intolerances: metoprolol caused fatigue  Labs:  04/2019:  Na 135, K 4.5, Glu 135, BUN 19, SCr 1.13  Wt  Readings from Last 3 Encounters:  11/08/19 188 lb (85.3 kg)  10/25/19 190 lb 9.6 oz (86.5 kg)  10/12/19 191 lb 6.4 oz (86.8 kg)   BP Readings from Last 3 Encounters:  11/08/19 130/76  10/25/19 134/73  10/12/19 130/70   Pulse Readings from Last 3 Encounters:  11/08/19 76  10/25/19 80  10/12/19 79    Current Outpatient Medications  Medication Sig Dispense Refill  . amLODipine (NORVASC) 5 MG tablet Take 1 tablet (5 mg total) by mouth daily. 90 tablet 3  . cyclobenzaprine (FLEXERIL) 10 MG tablet Take 1 tablet (10 mg total) by mouth at bedtime. 150 tablet 3  . DULoxetine (CYMBALTA) 20 MG capsule Take 1 capsule (20 mg total) by mouth daily. 30 capsule 11  . finasteride ([name]) 5 MG tablet Take 1 tablet (5 mg total) by mouth daily. 1 tablet 0  . FLUAD 0.5 ML SUSY TO BE ADMINISTERED BY PHARMACIST FOR IMMUNIZATION  0  . LORazepam (ATIVAN) 2 MG tablet Take 0.5 tablets (1 mg total) by mouth every 6 (six) hours as needed for anxiety. 60 tablet 5  . losartan (COZAAR) 100 MG tablet TAKE 1 TABLET BY MOUTH EVERY DAY 90 tablet 3  . Melatonin 10 MG TABS Take by mouth as needed.    . metoprolol tartrate (LOPRESSOR) 100 MG tablet Take 2 hours prior to Coronary CTA 1 tablet 0  . omeprazole (PRILOSEC) 20 MG capsule TAKE  1 CAPSULE (20 MG TOTAL) BY MOUTH 2 (TWO) TIMES DAILY BEFORE A MEAL. 60 capsule 11  . rosuvastatin (CRESTOR) 5 MG tablet Take 1 tablet by mouth 2-3 times weekly as tolerated 36 tablet 3  . sildenafil (REVATIO) 20 MG tablet TAKE 2 5 TABLETS BY MOUTH USE AS DIRECTED AS NEEDED    . tamsulosin (FLOMAX) 0.4 MG CAPS capsule Take 0.4 mg by mouth daily.    Marland Kitchen triamcinolone cream (KENALOG) 0.1 % Apply 1 application topically 2 (two) times daily as needed.     No current facility-administered medications for this visit.    Allergies  Allergen Reactions  . Codeine Other (See Comments)    hallucinations  . Lisinopril Cough    Past Medical History:  Diagnosis Date  . Anxiety   . Arthritis    . Cancer ([place]) 2010   basal cell ca, head  . Cataract    bilateral  . Depression   . ED (erectile dysfunction)   . GERD (gastroesophageal reflux disease)   . Hyperlipidemia   . Hypertension   . Kidney stone 07-29-11   passed   . 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 Vitreous floater    left eye, sees Dr. Dawna [facility]     Blood pressure 130/76, pulse 76, height 6\' 1"  (1.854 m), weight 188 lb (85.3 kg).  Essential hypertension Patient with essential hypertension, now fairly well controlled on amlodipine and losartan.  He should continue with current medications and continue monitoring home BP readings 3-4 times per week.  Asked that he calculate weekly averages and if they run > [id number] systolic, he would need to contact the office.     Tommy Medal PharmD CPP [facility] [address] [place], [place] [address] [phone]

## 2019-11-16 DIAGNOSIS — R931 Abnormal findings on diagnostic imaging of heart and coronary circulation: Secondary | ICD-10-CM | POA: Diagnosis not present

## 2019-11-16 LAB — HEPATIC FUNCTION PANEL
ALT: 21 IU/L (ref 0–44)
AST: 22 IU/L (ref 0–40)
Albumin: 4.2 g/dL (ref 3.8–4.8)
Alkaline Phosphatase: 52 IU/L (ref 39–117)
Bilirubin Total: 0.4 mg/dL (ref 0.0–1.2)
Bilirubin, Direct: 0.14 mg/dL (ref 0.00–0.40)
Total Protein: 6.7 g/dL (ref 6.0–8.5)

## 2019-11-16 LAB — LIPID PANEL
Chol/HDL Ratio: 2.3 ratio (ref 0.0–5.0)
Cholesterol, Total: 139 mg/dL (ref 100–199)
HDL: 60 mg/dL (ref >39)
LDL Chol Calc (NIH): 64 mg/dL (ref 0–99)
Triglycerides: 74 mg/dL (ref 0–149)
VLDL Cholesterol Cal: 15 mg/dL (ref 5–40)

## 2019-11-26 ENCOUNTER — Ambulatory Visit: Payer: Medicare PPO | Attending: Internal Medicine

## 2019-11-26 DIAGNOSIS — Z23 Encounter for immunization: Secondary | ICD-10-CM | POA: Insufficient documentation

## 2019-11-26 NOTE — Progress Notes (Signed)
   Covid-19 Vaccination Clinic  Name:  [name]    MRN: [id number] DOB: [birth year]  11/26/2019  [name] was observed post Covid-19 immunization for 15 minutes without incident. He was provided with Vaccine Information Sheet and instruction to access the V-Safe system.   [name] was instructed to call 911 with any severe reactions post vaccine: Marland Kitchen Difficulty breathing  . Swelling of face and throat  . A fast heartbeat  . A bad rash all over body  . Dizziness and weakness   Immunizations Administered    Name Date Dose VIS Date Route   Pfizer COVID-19 Vaccine 11/26/2019 11:34 AM 0.3 mL 09/02/2019 Intramuscular   Manufacturer: [place]   Lot: [id number]   [place]: [id number]

## 2019-12-06 ENCOUNTER — Telehealth (HOSPITAL_COMMUNITY): Payer: Self-pay | Admitting: Emergency Medicine

## 2019-12-06 ENCOUNTER — Encounter (HOSPITAL_COMMUNITY): Payer: Self-pay

## 2019-12-06 NOTE — Telephone Encounter (Signed)
Left message on voicemail with name and callback number Azure Barrales RN Navigator Cardiac Imaging [facility] [phone] Office [phone] Cell  

## 2019-12-07 ENCOUNTER — Ambulatory Visit (HOSPITAL_COMMUNITY)
Admission: RE | Admit: 2019-12-07 | Discharge: 2019-12-07 | Disposition: A | Discharge status: Home / Self Care | Payer: Medicare PPO | Source: Ambulatory Visit | Attending: Cardiovascular Disease | Admitting: Cardiovascular Disease

## 2019-12-07 ENCOUNTER — Other Ambulatory Visit: Payer: Self-pay

## 2019-12-07 DIAGNOSIS — I2510 Atherosclerotic heart disease of native coronary artery without angina pectoris: Secondary | ICD-10-CM | POA: Insufficient documentation

## 2019-12-07 DIAGNOSIS — R0789 Other chest pain: Secondary | ICD-10-CM

## 2019-12-07 DIAGNOSIS — Z006 Encounter for examination for normal comparison and control in clinical research program: Secondary | ICD-10-CM

## 2019-12-07 DIAGNOSIS — R079 Chest pain, unspecified: Secondary | ICD-10-CM | POA: Diagnosis not present

## 2019-12-07 DIAGNOSIS — R0602 Shortness of breath: Secondary | ICD-10-CM | POA: Diagnosis not present

## 2019-12-07 MED ORDER — NITROGLYCERIN 0.4 MG SL SUBL: 0.8000 mg | SUBLINGUAL_TABLET | Freq: Once | SUBLINGUAL | Status: AC

## 2019-12-07 MED ORDER — IOHEXOL 350 MG/ML SOLN
100.0000 mL | Freq: Once | INTRAVENOUS | Status: AC | PRN
  Administered 2019-12-07: 100 mL via INTRAVENOUS

## 2019-12-07 MED ORDER — NITROGLYCERIN 0.4 MG SL SUBL
SUBLINGUAL_TABLET | SUBLINGUAL | Status: AC
  Administered 2019-12-07: 0.8 mg via SUBLINGUAL
  Filled 2019-12-07: qty 2

## 2019-12-07 NOTE — Research (Signed)
Cadfem Informed Consent    Patient Name: [name]   Subject met inclusion and exclusion criteria.  The informed consent form, study requirements and expectations were reviewed with the subject and questions and concerns were addressed prior to the signing of the consent form.  The subject verbalized understanding of the trail requirements.  The subject agreed to participate in the CADFEM trial and signed the informed consent.  The informed consent was obtained prior to performance of any protocol-specific procedures for the subject.  A copy of the signed informed consent was given to the subject and a copy was placed in the subject's medical record.   Neva Seat

## 2019-12-08 DIAGNOSIS — R0602 Shortness of breath: Secondary | ICD-10-CM | POA: Diagnosis not present

## 2019-12-08 DIAGNOSIS — R0789 Other chest pain: Secondary | ICD-10-CM | POA: Diagnosis not present

## 2019-12-13 ENCOUNTER — Other Ambulatory Visit: Payer: Self-pay | Admitting: Family Medicine

## 2020-01-31 ENCOUNTER — Ambulatory Visit: Payer: Medicare PPO | Admitting: Cardiovascular Disease

## 2020-01-31 ENCOUNTER — Encounter: Payer: Self-pay | Admitting: Cardiovascular Disease

## 2020-01-31 ENCOUNTER — Other Ambulatory Visit: Payer: Self-pay

## 2020-01-31 VITALS — BP 130/74 | HR 75 | Temp 98.4°F | Ht 73.0 in | Wt 190.4 lb

## 2020-01-31 DIAGNOSIS — E782 Mixed hyperlipidemia: Secondary | ICD-10-CM

## 2020-01-31 DIAGNOSIS — I10 Essential (primary) hypertension: Secondary | ICD-10-CM

## 2020-01-31 DIAGNOSIS — I2510 Atherosclerotic heart disease of native coronary artery without angina pectoris: Secondary | ICD-10-CM

## 2020-01-31 NOTE — Patient Instructions (Signed)

## 2020-01-31 NOTE — Assessment & Plan Note (Signed)
History of coronary calcification on chest CT with core coronary calcium score of 1107 and coronary CTA performed 12/12/2019 showing primarily nonobstructive disease with borderline disease in the distal LAD and distal RCA.  He gets occasional effort angina but this is relatively rare.  I told him that I would have a low threshold to recommend diagnostic coronary angiography should his chest pain become more frequent and/or severe.

## 2020-01-31 NOTE — Assessment & Plan Note (Signed)
History of essential hypertension a blood pressure measured today 130/74.  He is on amlodipine and losartan.

## 2020-01-31 NOTE — Progress Notes (Signed)
01/31/2020 [name]   11/17/1950  [id number]  Primary Physician [name], MD Primary Cardiologist: [name] MD Garret Reddish, [place], [place]  HPI:  [name] is a 69 y.o.  thin appearing single Caucasian male with no children who was referred by Dr. Sarajane Jews for cardiovascular valuation because of coronary calcification seen on chest CT.  I last saw him in the office 10/25/2019.  He does have a Hospital doctor at home. 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 His only risk factor is treated hypertension and mild hyperlipidemia. There is no family history of heart disease. Never had a heart attack or stroke. He denies chest pain or shortness of breath. He did have a chest CT performed 06/07/2019 that showed coronary calcification and a subsequent Myoview stress test performed 06/23/2019 that was nonischemic.   His coronary calcium score performed 09/09/2019 was 1107 with calcification noted in the LAD and RCA.  He had a coronary CTA performed 12/07/2019 that showed primarily nonobstructive disease except some borderline lesions in the distal LAD and RCA.  Has had occasional effort angina but this is fairly rare.  He is statin intolerant to atorvastatin and is on low-dose Crestor every other day with recent lipid profile performed 11/16/2019 revealing total cholesterol 139, LDL 64 and HDL 60.   Current Meds  Medication Sig  . amLODipine (NORVASC) 5 MG tablet Take 1 tablet (5 mg total) by mouth daily.  . cyclobenzaprine (FLEXERIL) 10 MG tablet TAKE 1 TABLET BY MOUTH AT BEDTIME  . DULoxetine (CYMBALTA) 20 MG capsule Take 1 capsule (20 mg total) by mouth daily.  . finasteride ([name]) 5 MG tablet Take 1 tablet (5 mg total) by mouth daily.  Marland Kitchen FLUAD 0.5 ML SUSY TO BE ADMINISTERED BY PHARMACIST FOR IMMUNIZATION  . LORazepam (ATIVAN) 2 MG tablet Take 0.5 tablets (1 mg total) by mouth every 6 (six) hours as needed for anxiety.  Marland Kitchen  losartan (COZAAR) 100 MG tablet TAKE 1 TABLET BY MOUTH EVERY DAY  . Melatonin 10 MG TABS Take by mouth as needed.  Marland Kitchen omeprazole (PRILOSEC) 20 MG capsule TAKE 1 CAPSULE (20 MG TOTAL) BY MOUTH 2 (TWO) TIMES DAILY BEFORE A MEAL. (Patient taking differently: Take 20 mg by mouth daily. )  . rosuvastatin (CRESTOR) 5 MG tablet Take 1 tablet by mouth 2-3 times weekly as tolerated  . sildenafil (REVATIO) 20 MG tablet TAKE 2 5 TABLETS BY MOUTH USE AS DIRECTED AS NEEDED  . tamsulosin (FLOMAX) 0.4 MG CAPS capsule Take 0.4 mg by mouth daily.  Marland Kitchen triamcinolone cream (KENALOG) 0.1 % Apply 1 application topically 2 (two) times daily as needed.     Allergies  Allergen Reactions  . Codeine Other (See Comments)    hallucinations  . Lisinopril Cough    Social History   Socioeconomic History  . Marital status: Single    Spouse name: Not on file  . Number of children: Not on file  . Years of education: Not on file  . Highest education level: Not on file  Occupational History  . Occupation: retired    Comment: Pharmacist, hospital  Tobacco Use  . Smoking status: Never Smoker  . Smokeless tobacco: Never Used  Substance and Sexual Activity  . Alcohol use: No    Alcohol/week: 0.0 standard drinks  . Drug use: No  . Sexual activity: Not on file  Other Topics Concern  . Not on file  Social  History Narrative  . Not on file   Social Determinants of Health   Financial Resource Strain:   . Difficulty of Paying Living Expenses:   Food Insecurity:   . Worried About Charity fundraiser in the Last Year:   . Arboriculturist in the Last Year:   Transportation Needs:   . Film/video editor (Medical):   Marland Kitchen Lack of Transportation (Non-Medical):   Physical Activity:   . Days of Exercise per Week:   . Minutes of Exercise per Session:   Stress:   . Feeling of Stress :   Social Connections:   . Frequency of Communication with Friends and Family:   . Frequency of Social Gatherings with Friends and Family:   .  Attends Religious Services:   . Active Member of Clubs or Organizations:   . Attends Archivist Meetings:   Marland Kitchen Marital Status:   Intimate Partner Violence:   . Fear of Current or Ex-Partner:   . Emotionally Abused:   Marland Kitchen Physically Abused:   . Sexually Abused:      Review of Systems: General: negative for chills, fever, night sweats or weight changes.  Cardiovascular: negative for chest pain, dyspnea on exertion, edema, orthopnea, palpitations, paroxysmal nocturnal dyspnea or shortness of breath Dermatological: negative for rash Respiratory: negative for cough or wheezing Urologic: negative for hematuria Abdominal: negative for nausea, vomiting, diarrhea, bright red blood per rectum, melena, or hematemesis Neurologic: negative for visual changes, syncope, or dizziness All other systems reviewed and are otherwise negative except as noted above.    Blood pressure 130/74, pulse 75, temperature 98.4 F (36.9 C), height 6\' 1"  (1.854 m), weight 190 lb 6.4 oz (86.4 kg), SpO2 98 %.  General appearance: alert and no distress Neck: no adenopathy, no carotid bruit, no JVD, supple, symmetrical, trachea midline and thyroid not enlarged, symmetric, no tenderness/mass/nodules Lungs: clear to auscultation bilaterally Heart: regular rate and rhythm, S1, S2 normal, no murmur, click, rub or gallop Extremities: extremities normal, atraumatic, no cyanosis or edema Pulses: 2+ and symmetric Skin: Skin color, texture, turgor normal. No rashes or lesions Neurologic: Alert and oriented X 3, normal strength and tone. Normal symmetric reflexes. Normal coordination and gait  EKG sinus rhythm at 75 without ST or T wave changes.  I personally reviewed this EKG.  ASSESSMENT AND PLAN:   Hyperlipemia, mixed History of hyperlipidemia intolerant to Lipitor on low-dose Crestor every other day with lipid profile performed 11/16/2019 revealing total cholesterol 139, LDL 64 and HDL 60.  Essential  hypertension History of essential hypertension a blood pressure measured today 130/74.  He is on amlodipine and losartan.  Coronary artery calcification seen on CAT scan History of coronary calcification on chest CT with core coronary calcium score of 1107 and coronary CTA performed 12/12/2019 showing primarily nonobstructive disease with borderline disease in the distal LAD and distal RCA.  He gets occasional effort angina but this is relatively rare.  I told him that I would have a low threshold to recommend diagnostic coronary angiography should his chest pain become more frequent and/or severe.      [name] MD FACP,FACC,FAHA, [facility] 01/31/2020 11:23 AM

## 2020-01-31 NOTE — Assessment & Plan Note (Signed)
History of hyperlipidemia intolerant to Lipitor on low-dose Crestor every other day with lipid profile performed 11/16/2019 revealing total cholesterol 139, LDL 64 and HDL 60.

## 2020-02-05 ENCOUNTER — Other Ambulatory Visit: Payer: Self-pay | Admitting: Family Medicine

## 2020-04-04 ENCOUNTER — Other Ambulatory Visit: Payer: Self-pay | Admitting: Family Medicine

## 2020-04-05 NOTE — Telephone Encounter (Signed)
Last filled 10/12/2019 Last OV 10/12/2019  Ok to fill?

## 2020-04-06 ENCOUNTER — Encounter: Payer: Self-pay | Admitting: Family Medicine

## 2020-04-06 ENCOUNTER — Other Ambulatory Visit: Payer: Self-pay

## 2020-04-06 ENCOUNTER — Ambulatory Visit: Payer: Medicare PPO | Admitting: Family Medicine

## 2020-04-06 VITALS — BP 120/64 | HR 61 | Temp 97.7°F | Wt 191.0 lb

## 2020-04-06 DIAGNOSIS — I10 Essential (primary) hypertension: Secondary | ICD-10-CM | POA: Diagnosis not present

## 2020-04-06 DIAGNOSIS — F411 Generalized anxiety disorder: Secondary | ICD-10-CM

## 2020-04-06 DIAGNOSIS — R911 Solitary pulmonary nodule: Secondary | ICD-10-CM | POA: Diagnosis not present

## 2020-04-06 MED ORDER — LORAZEPAM 2 MG PO TABS: 1.0000 mg | ORAL_TABLET | Freq: Four times a day (QID) | ORAL | 5 refills | Status: DC | PRN

## 2020-04-06 NOTE — Progress Notes (Signed)
   Subjective:    Patient ID: [name], male    DOB: 02-Sep-1951, 69 y.o.   MRN: [id number]  HPI Here for refills on Lorazepam and with questions about the lung nodule we found last year. On 06-07-19 he had a chest CT which revealed a 3 mm nodule in the LLL. It was recommended to follow this up with another scan in one year. [name] is very anxious about this and worries he could have a cancer. He has no SOB or cough, and he feels fine.    Review of Systems  Constitutional: Negative.   Respiratory: Negative.   Cardiovascular: Negative.   Psychiatric/Behavioral: Negative for dysphoric mood. The patient is nervous/anxious.        Objective:   Physical Exam Constitutional:      Appearance: Normal appearance.  Cardiovascular:     Rate and Rhythm: Normal rate and regular rhythm.     Pulses: Normal pulses.     Heart sounds: Normal heart sounds.  Pulmonary:     Effort: Pulmonary effort is normal.     Breath sounds: Normal breath sounds.  Neurological:     Mental Status: He is alert.  Psychiatric:        Mood and Affect: Mood normal.        Behavior: Behavior normal.        Thought Content: Thought content normal.        Judgment: Judgment normal.           Assessment & Plan:  His anxiety is stable and we refiled Lorazepam. I reassured him that the lung nodule protocol is based on many patient outcomes, and I think it is perfectly safe to stick with our plan. He agreed, so we will set up a non-contrasted chest CT for late September.  [name] Penna, MD

## 2020-05-01 ENCOUNTER — Other Ambulatory Visit: Payer: Self-pay | Admitting: Family Medicine

## 2020-05-23 ENCOUNTER — Other Ambulatory Visit: Payer: Self-pay

## 2020-05-24 ENCOUNTER — Other Ambulatory Visit: Payer: Self-pay

## 2020-05-24 ENCOUNTER — Ambulatory Visit (INDEPENDENT_AMBULATORY_CARE_PROVIDER_SITE_OTHER): Payer: Medicare PPO | Admitting: Family Medicine

## 2020-05-24 ENCOUNTER — Encounter: Payer: Self-pay | Admitting: Family Medicine

## 2020-05-24 VITALS — BP 110/60 | HR 82 | Temp 97.9°F | Ht 73.0 in | Wt 188.8 lb

## 2020-05-24 DIAGNOSIS — Z0000 Encounter for general adult medical examination without abnormal findings: Secondary | ICD-10-CM

## 2020-05-24 DIAGNOSIS — R911 Solitary pulmonary nodule: Secondary | ICD-10-CM | POA: Diagnosis not present

## 2020-05-24 NOTE — Progress Notes (Signed)
   Subjective:    Patient ID: [name], male    DOB: [birth year], 69 y.o.   MRN: [id number]  HPI Here for a well exam. He feels fine. He will see Urology in a few weeks.    Review of Systems  Constitutional: Negative.   HENT: Negative.   Eyes: Negative.   Respiratory: Negative.   Cardiovascular: Negative.   Gastrointestinal: Negative.   Genitourinary: Negative.   Musculoskeletal: Negative.   Skin: Negative.   Neurological: Negative.   Psychiatric/Behavioral: Negative.        Objective:   Physical Exam Constitutional:      General: He is not in acute distress.    Appearance: He is well-developed. He is not diaphoretic.  HENT:     Head: Normocephalic and atraumatic.     Right Ear: External ear normal.     Left Ear: External ear normal.     Nose: Nose normal.     Mouth/Throat:     Pharynx: No oropharyngeal exudate.  Eyes:     General: No scleral icterus.       Right eye: No discharge.        Left eye: No discharge.     Conjunctiva/sclera: Conjunctivae normal.     Pupils: Pupils are equal, round, and reactive to light.  Neck:     Thyroid: No thyromegaly.     Vascular: No JVD.     Trachea: No tracheal deviation.  Cardiovascular:     Rate and Rhythm: Normal rate and regular rhythm.     Heart sounds: Normal heart sounds. No murmur heard.  No friction rub. No gallop.   Pulmonary:     Effort: Pulmonary effort is normal. No respiratory distress.     Breath sounds: Normal breath sounds. No wheezing or rales.  Chest:     Chest wall: No tenderness.  Abdominal:     General: Bowel sounds are normal. There is no distension.     Palpations: Abdomen is soft. There is no mass.     Tenderness: There is no abdominal tenderness. There is no guarding or rebound.  Genitourinary:    Penis: No tenderness.   Musculoskeletal:        General: No tenderness. Normal range of motion.     Cervical back: Neck supple.  Lymphadenopathy:     Cervical: No cervical adenopathy.   Skin:    General: Skin is warm and dry.     Coloration: Skin is not pale.     Findings: No erythema or rash.  Neurological:     Mental Status: He is alert and oriented to person, place, and time.     Cranial Nerves: No cranial nerve deficit.     Motor: No abnormal muscle tone.     Coordination: Coordination normal.     Deep Tendon Reflexes: Reflexes are normal and symmetric. Reflexes normal.  Psychiatric:        Behavior: Behavior normal.        Thought Content: Thought content normal.        Judgment: Judgment normal.           Assessment & Plan:  Well exam. We discussed diet and exercise. Get fasting labs. Set up a low dose chest CT to follow up on the lung nodule from a year ago. [name] Penna, MD

## 2020-05-25 LAB — LIPID PANEL
Cholesterol: 145 mg/dL (ref <200)
HDL: 59 mg/dL (ref >=40)
LDL Cholesterol (Calc): 71 mg/dL (calc)
Non-HDL Cholesterol (Calc): 86 mg/dL (calc) (ref <130)
Total CHOL/HDL Ratio: 2.5 (calc) (ref <5.0)
Triglycerides: 72 mg/dL (ref <150)

## 2020-05-25 LAB — HEPATIC FUNCTION PANEL
AG Ratio: 1.6 (calc) (ref 1.0–2.5)
ALT: 20 U/L (ref 9–46)
AST: 16 U/L (ref 10–35)
Albumin: 4.2 g/dL (ref 3.6–5.1)
Alkaline phosphatase (APISO): 44 U/L (ref 35–144)
Bilirubin, Direct: 0.1 mg/dL (ref 0.0–0.2)
Globulin: 2.6 g/dL (calc) (ref 1.9–3.7)
Indirect Bilirubin: 0.5 mg/dL (calc) (ref 0.2–1.2)
Total Bilirubin: 0.6 mg/dL (ref 0.2–1.2)
Total Protein: 6.8 g/dL (ref 6.1–8.1)

## 2020-05-25 LAB — CBC WITH DIFFERENTIAL/PLATELET
Absolute Monocytes: 507 cells/uL (ref 200–950)
Basophils Absolute: 59 cells/uL (ref 0–200)
Basophils Relative: 1 %
Eosinophils Absolute: 201 cells/uL (ref 15–500)
Eosinophils Relative: 3.4 %
HCT: 44.6 % (ref 38.5–50.0)
Hemoglobin: 14.9 g/dL (ref 13.2–17.1)
Lymphs Abs: 1027 cells/uL (ref 850–3900)
MCH: 30.7 pg (ref 27.0–33.0)
MCHC: 33.4 g/dL (ref 32.0–36.0)
MCV: 92 fL (ref 80.0–100.0)
MPV: 11.1 fL (ref 7.5–12.5)
Monocytes Relative: 8.6 %
Neutro Abs: 4106 cells/uL (ref 1500–7800)
Neutrophils Relative %: 69.6 %
Platelets: 180 10*3/uL (ref 140–400)
RBC: 4.85 10*6/uL (ref 4.20–5.80)
RDW: 12.7 % (ref 11.0–15.0)
Total Lymphocyte: 17.4 %
WBC: 5.9 10*3/uL (ref 3.8–10.8)

## 2020-05-25 LAB — BASIC METABOLIC PANEL
BUN: 19 mg/dL (ref 7–25)
CO2: 26 mmol/L (ref 20–32)
Calcium: 9 mg/dL (ref 8.6–10.3)
Chloride: 103 mmol/L (ref 98–110)
Creat: 1.12 mg/dL (ref 0.70–1.25)
Glucose, Bld: 104 mg/dL — ABNORMAL HIGH (ref 65–99)
Potassium: 4.1 mmol/L (ref 3.5–5.3)
Sodium: 137 mmol/L (ref 135–146)

## 2020-05-25 LAB — HEMOGLOBIN A1C
Hgb A1c MFr Bld: 5.5 % of total Hgb (ref <5.7)
Mean Plasma Glucose: 111 (calc)
eAG (mmol/L): 6.2 (calc)

## 2020-05-25 LAB — PSA: PSA: 0.7 ng/mL (ref <=4.0)

## 2020-05-25 LAB — TSH: TSH: 1.63 mIU/L (ref 0.40–4.50)

## 2020-05-29 ENCOUNTER — Telehealth: Payer: Self-pay | Admitting: Family Medicine

## 2020-05-29 NOTE — Telephone Encounter (Signed)
Left message for patient to schedule Annual Wellness Visit.  Please schedule with Nurse Health Advisor Shannon Crews, RN at [facility]  

## 2020-05-30 DIAGNOSIS — Z818 Family history of other mental and behavioral disorders: Secondary | ICD-10-CM | POA: Diagnosis not present

## 2020-05-30 DIAGNOSIS — Z888 Allergy status to other drugs, medicaments and biological substances status: Secondary | ICD-10-CM | POA: Diagnosis not present

## 2020-05-30 DIAGNOSIS — F339 Major depressive disorder, recurrent, unspecified: Secondary | ICD-10-CM | POA: Diagnosis not present

## 2020-05-30 DIAGNOSIS — M1990 Unspecified osteoarthritis, unspecified site: Secondary | ICD-10-CM | POA: Diagnosis not present

## 2020-05-30 DIAGNOSIS — Z885 Allergy status to narcotic agent status: Secondary | ICD-10-CM | POA: Diagnosis not present

## 2020-05-30 DIAGNOSIS — E785 Hyperlipidemia, unspecified: Secondary | ICD-10-CM | POA: Diagnosis not present

## 2020-05-30 DIAGNOSIS — M792 Neuralgia and neuritis, unspecified: Secondary | ICD-10-CM | POA: Diagnosis not present

## 2020-05-30 DIAGNOSIS — I10 Essential (primary) hypertension: Secondary | ICD-10-CM | POA: Diagnosis not present

## 2020-05-30 DIAGNOSIS — K219 Gastro-esophageal reflux disease without esophagitis: Secondary | ICD-10-CM | POA: Diagnosis not present

## 2020-05-30 DIAGNOSIS — Z8249 Family history of ischemic heart disease and other diseases of the circulatory system: Secondary | ICD-10-CM | POA: Diagnosis not present

## 2020-05-30 DIAGNOSIS — F419 Anxiety disorder, unspecified: Secondary | ICD-10-CM | POA: Diagnosis not present

## 2020-05-30 DIAGNOSIS — Z85828 Personal history of other malignant neoplasm of skin: Secondary | ICD-10-CM | POA: Diagnosis not present

## 2020-05-30 DIAGNOSIS — H269 Unspecified cataract: Secondary | ICD-10-CM | POA: Diagnosis not present

## 2020-05-30 DIAGNOSIS — G8929 Other chronic pain: Secondary | ICD-10-CM | POA: Diagnosis not present

## 2020-06-08 ENCOUNTER — Ambulatory Visit (HOSPITAL_COMMUNITY)
Admission: RE | Admit: 2020-06-08 | Discharge: 2020-06-08 | Disposition: A | Discharge status: Home / Self Care | Payer: Medicare PPO | Source: Ambulatory Visit | Attending: Family Medicine | Admitting: Family Medicine

## 2020-06-08 ENCOUNTER — Other Ambulatory Visit: Payer: Self-pay

## 2020-06-08 DIAGNOSIS — R911 Solitary pulmonary nodule: Secondary | ICD-10-CM | POA: Diagnosis not present

## 2020-06-08 DIAGNOSIS — Z122 Encounter for screening for malignant neoplasm of respiratory organs: Secondary | ICD-10-CM | POA: Insufficient documentation

## 2020-06-08 DIAGNOSIS — I2510 Atherosclerotic heart disease of native coronary artery without angina pectoris: Secondary | ICD-10-CM | POA: Diagnosis not present

## 2020-06-08 DIAGNOSIS — I700 Atherosclerosis of aorta: Secondary | ICD-10-CM | POA: Insufficient documentation

## 2020-06-08 DIAGNOSIS — Z87891 Personal history of nicotine dependence: Secondary | ICD-10-CM | POA: Diagnosis not present

## 2020-06-12 DIAGNOSIS — N5201 Erectile dysfunction due to arterial insufficiency: Secondary | ICD-10-CM | POA: Diagnosis not present

## 2020-06-12 DIAGNOSIS — N401 Enlarged prostate with lower urinary tract symptoms: Secondary | ICD-10-CM | POA: Diagnosis not present

## 2020-06-12 DIAGNOSIS — R3914 Feeling of incomplete bladder emptying: Secondary | ICD-10-CM | POA: Diagnosis not present

## 2020-06-26 ENCOUNTER — Other Ambulatory Visit: Payer: Self-pay | Admitting: Family Medicine

## 2020-07-04 ENCOUNTER — Other Ambulatory Visit: Payer: Self-pay | Admitting: Cardiovascular Disease

## 2020-07-04 NOTE — Telephone Encounter (Signed)
Rx request sent to pharmacy.  

## 2020-07-07 ENCOUNTER — Other Ambulatory Visit: Payer: Self-pay

## 2020-07-07 ENCOUNTER — Ambulatory Visit: Payer: Medicare PPO | Attending: Internal Medicine

## 2020-07-07 DIAGNOSIS — Z23 Encounter for immunization: Secondary | ICD-10-CM

## 2020-07-07 NOTE — Progress Notes (Signed)
   Covid-19 Vaccination Clinic  Name:  [name]    MRN: [id number] DOB: 1951/06/02  07/07/2020  [name] was observed post Covid-19 immunization for 15 minutes without incident. He was provided with Vaccine Information Sheet and instruction to access the V-Safe system.   [name] was instructed to call 911 with any severe reactions post vaccine: Marland Kitchen Difficulty breathing  . Swelling of face and throat  . A fast heartbeat  . A bad rash all over body  . Dizziness and weakness

## 2020-08-22 ENCOUNTER — Ambulatory Visit
Admission: RE | Admit: 2020-08-22 | Discharge: 2020-08-22 | Disposition: A | Discharge status: Home / Self Care | Payer: Medicare PPO | Source: Ambulatory Visit

## 2020-08-22 VITALS — BP 135/80 | HR 82 | Temp 98.2°F | Resp 18

## 2020-08-22 DIAGNOSIS — R5383 Other fatigue: Secondary | ICD-10-CM

## 2020-08-22 DIAGNOSIS — K1230 Oral mucositis (ulcerative), unspecified: Secondary | ICD-10-CM | POA: Diagnosis not present

## 2020-08-22 DIAGNOSIS — K121 Other forms of stomatitis: Secondary | ICD-10-CM

## 2020-08-22 DIAGNOSIS — R52 Pain, unspecified: Secondary | ICD-10-CM

## 2020-08-22 DIAGNOSIS — Z1152 Encounter for screening for COVID-19: Secondary | ICD-10-CM

## 2020-08-22 DIAGNOSIS — R6883 Chills (without fever): Secondary | ICD-10-CM | POA: Diagnosis not present

## 2020-08-22 MED ORDER — MAGIC MOUTHWASH: 10.0000 mL | Freq: Three times a day (TID) | ORAL | 0 refills | Status: DC | PRN

## 2020-08-22 NOTE — ED Triage Notes (Signed)
Pt presents with blood blisters and white sores on tongue x approx 1 week.  Has also been experiencing fatigue, fever, chills, body aches and "tiredness" in chest.    COVID Vaccine x 3.

## 2020-08-22 NOTE — ED Provider Notes (Signed)
[place]   [id number] 08/22/20 Arrival Time: 2409   CC: COVID symptoms  SUBJECTIVE: History from: patient.  [name] is a 69 y.o. male who presents with abrupt onset of fatigue, aches, chills and "blood blisters" to the tongue and reportst some small ulcers to the inside of the lower lip. Denies sick exposure to COVID, flu or strep. Denies recent travel. Has negative history of Covid. Has completed Covid vaccines. Has not taken OTC medications for this. There are no aggravating or alleviating factors. Denies previous symptoms in the past. Denies fever, sinus pain, rhinorrhea, sore throat, SOB, wheezing, chest pain, nausea, changes in bowel or bladder habits.    ROS: As per HPI.  All other pertinent ROS negative.     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 Dawna [facility] at [facility]    Past Surgical History:  Procedure Laterality Date   BASAL CELL CARCINOMA EXCISION     removed from scalp   COLONOSCOPY  07/03/2017   per Dr. Havery Moros, sessile serrated polyp, repeat in 5 yrs    HERNIA REPAIR     Allergies  Allergen Reactions   Codeine Other (See Comments)    hallucinations   Lisinopril Cough   No current facility-administered medications on file prior to encounter.   Current Outpatient Medications on File Prior to Encounter  Medication Sig Dispense Refill   acetaminophen (TYLENOL) 325 MG tablet Take 650 mg by mouth every 6 (six) hours as needed.     amLODipine (NORVASC) 5 MG tablet TAKE 1 TABLET BY MOUTH EVERY DAY 90 tablet 3   cyclobenzaprine (FLEXERIL) 10 MG tablet TAKE 1 TABLET BY MOUTH AT  BEDTIME 90 tablet 2   DULoxetine (CYMBALTA) 20 MG capsule TAKE 1 CAPSULE BY MOUTH EVERY DAY 30 capsule 11   finasteride ([name]) 5 MG tablet Take 1 tablet (5 mg total) by mouth daily. 1 tablet 0   LORazepam (ATIVAN) 2 MG tablet Take 0.5 tablets (1 mg total) by mouth every 6 (six) hours as needed for anxiety. 60 tablet 5   losartan (COZAAR) 100 MG tablet TAKE 1 TABLET BY MOUTH EVERY DAY 90 tablet 3   Melatonin 10 MG TABS Take by mouth as needed.     omeprazole (PRILOSEC) 20 MG capsule TAKE 1 CAPSULE (20 MG TOTAL) BY MOUTH 2 (TWO) TIMES DAILY BEFORE A MEAL. 60 capsule 11   rosuvastatin (CRESTOR) 5 MG tablet Take 1 tablet by mouth 2-3 times weekly as tolerated 36 tablet 3   sildenafil (REVATIO) 20 MG tablet TAKE 2 5 TABLETS BY MOUTH USE AS DIRECTED AS NEEDED     tamsulosin (FLOMAX) 0.4 MG CAPS capsule Take 0.4 mg by mouth daily.     triamcinolone cream (KENALOG) 0.1 % Apply 1 application topically 2 (two) times daily as needed.     FLUAD 0.5 ML SUSY TO BE ADMINISTERED BY PHARMACIST FOR IMMUNIZATION  0   [DISCONTINUED] tadalafil (CIALIS) 20 MG tablet Take 20 mg by mouth daily as needed.       Social  History   Socioeconomic History   Marital status: Single    Spouse name: Not on file   Number of children: Not on file   Years of education: Not on file   Highest education level: Not on file  Occupational History   Occupation: retired    Comment: Pharmacist, hospital  Tobacco Use   Smoking status: Never Smoker   Smokeless tobacco: Never Used  Scientific laboratory technician Use: Never used  Substance and Sexual Activity   Alcohol use: No    Alcohol/week: 0.0 standard drinks   Drug use: No   Sexual activity: Not on file  Other Topics Concern   Not on file  Social History Narrative   Not on file   Social Determinants of Health   Financial Resource Strain:    Difficulty of Paying Living Expenses: Not on file  Food Insecurity:    Worried About Charity fundraiser in the Last Year:  Not on file   [place] in the Last Year: Not on file  Transportation Needs:    Lack of Transportation (Medical): Not on file   Lack of Transportation (Non-Medical): Not on file  Physical Activity:    Days of Exercise per Week: Not on file   Minutes of Exercise per Session: Not on file  Stress:    Feeling of Stress : Not on file  Social Connections:    Frequency of Communication with Friends and Family: Not on file   Frequency of Social Gatherings with Friends and Family: Not on file   Attends Religious Services: Not on file   Active Member of Clubs or Organizations: Not on file   Attends Archivist Meetings: Not on file   Marital Status: Not on file  Intimate Partner Violence:    Fear of Current or Ex-Partner: Not on file   Emotionally Abused: Not on file   Physically Abused: Not on file   Sexually Abused: Not on file   Family History  Problem Relation Age of Onset   Hypertension Mother    Hyperlipidemia Mother    Depression Other    Diabetes Other    Hyperlipidemia Other    Hypertension Other    Alzheimer's disease Other    Colon cancer Neg Hx     OBJECTIVE:  Vitals:   08/22/20 1113 08/22/20 1115  BP: 135/80   Pulse: 82   Resp: 18   Temp:  98.2 F (36.8 C)  TempSrc: Oral Oral  SpO2: 95%      General appearance: alert; appears fatigued, but nontoxic; speaking in full sentences and tolerating own secretions HEENT: NCAT; Ears: EACs clear, TMs pearly gray; Eyes: PERRL.  EOM grossly intact. Sinuses: nontender; Nose: nares patent without rhinorrhea, Throat: oropharynx clear, tonsils non erythematous or enlarged, uvula midline; Mouth: tongue with lesion to left border, erythematous, no draining; pinpoint lesions to inner lower lip, no erythema, no draining Neck: supple without LAD Lungs: unlabored respirations, symmetrical air entry; cough: absent; no respiratory distress; CTAB Heart: regular rate and rhythm.  Radial pulses 2+  symmetrical bilaterally Skin: warm and dry Psychological: alert and cooperative; normal mood and affect  LABS:  No results found for this or any previous visit (from the past 24 hour(s)).   ASSESSMENT & PLAN:  1. Stomatitis and mucositis   2. Other fatigue   3. Chills   4. Aches   5. Encounter for screening for COVID-19     Meds ordered this encounter  Medications  magic mouthwash SOLN    Sig: Take 10 mLs by mouth 3 (three) times daily as needed for mouth pain.    Dispense:  120 mL    Refill:  0    Order Specific Question:   Supervising Provider    Answer:   Chase Picket [id number]   Continue supportive care at home Magic mouthwash prescribed   COVID and flu testing ordered.  It will take between 1-2 days for test results.  Someone will contact you regarding abnormal results.    Patient should remain in quarantine until they have received Covid results.  If negative you may resume normal activities (go back to work/school) while practicing hand hygiene, social distance, and mask wearing.  If positive, patient should remain in quarantine for 10 days from symptom onset AND greater than 72 hours after symptoms resolution (absence of fever without the use of fever-reducing medication and improvement in respiratory symptoms), whichever is longer Get plenty of rest and push fluids Use OTC zyrtec for nasal congestion, runny nose, and/or sore throat Use OTC flonase for nasal congestion and runny nose Use medications daily for symptom relief Use OTC medications like ibuprofen or tylenol as needed fever or pain Call or go to the ED if you have any new or worsening symptoms such as fever, worsening cough, shortness of breath, chest tightness, chest pain, turning blue, changes in mental status.  Reviewed expectations re: course of current medical issues. Questions answered. Outlined signs and symptoms indicating need for more acute intervention. Patient verbalized  understanding. After Visit Summary given.         Faustino Congress, NP 08/22/20 1227

## 2020-08-22 NOTE — Discharge Instructions (Addendum)
I have sent in Magic mouthwash for you to use three times per day for the next 3-4 days  If symptoms are persisting, follow up with primary care or with this office  Your COVID and Flu tests are pending.  You should self quarantine until the test results are back.    Take Tylenol or ibuprofen as needed for fever or discomfort.  Rest and keep yourself hydrated.    Follow-up with your primary care provider if your symptoms are not improving.

## 2020-08-24 LAB — COVID-19, FLU A+B AND RSV
Influenza A, NAA: NOT DETECTED
Influenza B, NAA: NOT DETECTED
RSV, NAA: NOT DETECTED
SARS-CoV-2, NAA: NOT DETECTED

## 2020-09-05 ENCOUNTER — Other Ambulatory Visit: Payer: Self-pay | Admitting: Cardiovascular Disease

## 2020-09-28 ENCOUNTER — Other Ambulatory Visit: Payer: Self-pay | Admitting: Family Medicine

## 2020-09-28 NOTE — Telephone Encounter (Signed)
Last office visit- 05-24-20 Last refill-12-13-2019

## 2020-10-02 ENCOUNTER — Ambulatory Visit: Payer: Medicare PPO | Admitting: Family Medicine

## 2020-10-02 ENCOUNTER — Telehealth: Payer: Self-pay | Admitting: Family Medicine

## 2020-10-02 ENCOUNTER — Encounter: Payer: Self-pay | Admitting: Family Medicine

## 2020-10-02 ENCOUNTER — Other Ambulatory Visit: Payer: Self-pay

## 2020-10-02 VITALS — BP 150/70 | HR 75 | Temp 98.5°F | Ht 73.0 in | Wt 193.8 lb

## 2020-10-02 DIAGNOSIS — F411 Generalized anxiety disorder: Secondary | ICD-10-CM

## 2020-10-02 DIAGNOSIS — M25512 Pain in left shoulder: Secondary | ICD-10-CM

## 2020-10-02 MED ORDER — LORAZEPAM 2 MG PO TABS: 1.0000 mg | ORAL_TABLET | Freq: Four times a day (QID) | ORAL | 5 refills | Status: DC | PRN

## 2020-10-02 NOTE — Telephone Encounter (Signed)
Pt is calling in stating that the pharmacy would not and did not give him his #90 of the Rx lorazepam (ATIVAN) 2 MG with the instructions not being changed is the reason that they would not give him the remaining #30 pills to make it a #90.  Pt would like to see if Dr. Sarajane Jews could make that change and send it in to the pharmacy since that was something that they discussed in his visit today (10/02/2020) of the increase.  [facility] on [address] in [place], [place].  Pt would like to have a call to let him know if it going to be called in.

## 2020-10-02 NOTE — Progress Notes (Signed)
   Subjective:    Patient ID: [name], male    DOB: [birth year], 70 y.o.   MRN: [id number]  HPI Here for several issues. First he needs a refill on Lorazepam. It works well but he has been more anxious than usual lately, and he asks if he can take more than 2 pills a day. Also for the past month he has had pain in the anterior left shoulder when he does things like take a carton of milk out of the refrigerator. No hx of trauma. He cannot take NSAIDs, so he has tried Tylenol with no relief.    Review of Systems  Constitutional: Negative.   Respiratory: Negative.   Cardiovascular: Negative.   Musculoskeletal: Positive for arthralgias.  Psychiatric/Behavioral: Positive for sleep disturbance. Negative for dysphoric mood. The patient is nervous/anxious.        Objective:   Physical Exam Constitutional:      Appearance: Normal appearance.  Cardiovascular:     Rate and Rhythm: Normal rate and regular rhythm.     Pulses: Normal pulses.     Heart sounds: Normal heart sounds.  Pulmonary:     Effort: Pulmonary effort is normal.  Musculoskeletal:     Comments: He is tender in the anterior left shoulder and along the proximal biceps. ROM is full   Neurological:     Mental Status: He is alert.  Psychiatric:        Mood and Affect: Mood normal.        Thought Content: Thought content normal.        Judgment: Judgment normal.           Assessment & Plan:  For the anxiety, we will refill the Lorazepam for 90 at a time with refills so he can take this TID if needed. He has biceps tendonitis, so he can try Voltaren gel and ice packs. Recheck as needed. [name] Penna, MD

## 2020-10-03 MED ORDER — LORAZEPAM 2 MG PO TABS: 2.0000 mg | ORAL_TABLET | Freq: Four times a day (QID) | ORAL | 5 refills | Status: DC | PRN

## 2020-10-03 NOTE — Telephone Encounter (Signed)
[facility] and spoke to [place], [facility] about the Lorazepam 2 mg prescription sent on today. I advised him what the patient says below. He says the patient already picked up on yesterday from the old prescription and he will not be refilling this new one until he runs out. He says to tell the patient to take what he has following the new instructions and when he runs out to come to the pharmacy for the new prescription refill and he will be able to get it. I called the patient and advised what the [facility] says, patient verbalized understanding.

## 2020-10-03 NOTE — Telephone Encounter (Signed)
I sent in a new rx for this. Please call the pharmacy to explain that we increased the dose from 1/2 tablet to a whole tablet which is why I wrote to give him 20

## 2020-10-09 ENCOUNTER — Ambulatory Visit: Payer: Medicare PPO | Admitting: Cardiovascular Disease

## 2020-10-12 ENCOUNTER — Ambulatory Visit: Payer: Medicare PPO | Admitting: Cardiovascular Disease

## 2020-10-24 DIAGNOSIS — L570 Actinic keratosis: Secondary | ICD-10-CM | POA: Diagnosis not present

## 2020-11-21 DIAGNOSIS — D225 Melanocytic nevi of trunk: Secondary | ICD-10-CM | POA: Diagnosis not present

## 2020-11-21 DIAGNOSIS — L821 Other seborrheic keratosis: Secondary | ICD-10-CM | POA: Diagnosis not present

## 2020-11-21 DIAGNOSIS — L409 Psoriasis, unspecified: Secondary | ICD-10-CM | POA: Diagnosis not present

## 2020-11-21 DIAGNOSIS — L820 Inflamed seborrheic keratosis: Secondary | ICD-10-CM | POA: Diagnosis not present

## 2020-11-21 DIAGNOSIS — Z85828 Personal history of other malignant neoplasm of skin: Secondary | ICD-10-CM | POA: Diagnosis not present

## 2020-11-21 DIAGNOSIS — L814 Other melanin hyperpigmentation: Secondary | ICD-10-CM | POA: Diagnosis not present

## 2020-11-21 DIAGNOSIS — L570 Actinic keratosis: Secondary | ICD-10-CM | POA: Diagnosis not present

## 2020-11-21 DIAGNOSIS — Z86008 Personal history of in-situ neoplasm of other site: Secondary | ICD-10-CM | POA: Diagnosis not present

## 2020-11-21 DIAGNOSIS — Z411 Encounter for cosmetic surgery: Secondary | ICD-10-CM | POA: Diagnosis not present

## 2020-12-04 ENCOUNTER — Other Ambulatory Visit: Payer: Self-pay | Admitting: Family Medicine

## 2020-12-04 NOTE — Telephone Encounter (Signed)
Please see note from pharmacy

## 2020-12-05 ENCOUNTER — Other Ambulatory Visit: Payer: Self-pay

## 2020-12-05 ENCOUNTER — Ambulatory Visit: Payer: Medicare PPO | Admitting: Cardiovascular Disease

## 2020-12-05 ENCOUNTER — Encounter: Payer: Self-pay | Admitting: Cardiovascular Disease

## 2020-12-05 VITALS — BP 130/76 | HR 77 | Ht 73.0 in | Wt 198.0 lb

## 2020-12-05 DIAGNOSIS — I10 Essential (primary) hypertension: Secondary | ICD-10-CM | POA: Diagnosis not present

## 2020-12-05 DIAGNOSIS — E782 Mixed hyperlipidemia: Secondary | ICD-10-CM

## 2020-12-05 DIAGNOSIS — I2510 Atherosclerotic heart disease of native coronary artery without angina pectoris: Secondary | ICD-10-CM | POA: Diagnosis not present

## 2020-12-05 NOTE — Assessment & Plan Note (Signed)
History of hyperlipidemia on statin therapy with lipid profile performed 05/24/2020 revealing total cholesterol of 145, LDL 71 and HDL of 59.

## 2020-12-05 NOTE — Assessment & Plan Note (Signed)
History of coronary calcification found initially on chest CT.  Ultimately had a coronary calcium score performed 09/09/2019 which was 1107 with calcifications noted in the LAD and RCA.  He had coronary CTA performed 12/07/2019 revealing borderline lesions in the distal LAD and RCA.  He gets occasional effort angina but this is fairly infrequent.  He did had a negative Myoview performed 06/23/2019.

## 2020-12-05 NOTE — Patient Instructions (Signed)

## 2020-12-05 NOTE — Progress Notes (Signed)
12/05/2020 [name]   February 23, 1951  [id number]  Primary Physician [name], MD Primary Cardiologist: [name] MD Garret Reddish, [place], [place]  HPI:  [name] is a 70 y.o.  thin appearing single Caucasian male with no children who was referred by Dr. Sarajane Jews for cardiovascular valuation because of coronary calcification seen on chest CT.I last saw him in the office  01/31/2020. He does have a Hospital doctor at home. 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 His only risk factor is treated hypertension and mild hyperlipidemia. There is no family history of heart disease. Never had a heart attack or stroke. He denies chest pain or shortness of breath. He did have a chest CT performed 06/07/2019 that showed coronary calcification and a subsequent Myoview stress test performed 06/23/2019 that was nonischemic.  His coronary calcium score performed 09/09/2019 was 1107 with calcification noted in the LAD and RCA.  He had a coronary CTA performed 12/07/2019 that showed primarily nonobstructive disease except some borderline lesions in the distal LAD and RCA.  Has had occasional effort angina but this is fairly rare.  He is statin intolerant to atorvastatin and is on low-dose Crestor every other day with recent lipid profile performed 11/16/2019 revealing total cholesterol 139, LDL 64 and HDL 60.  Since I saw him back in May 2021 he continues to do well.  He gets occasional effort angina which is fairly infrequent.  Lipid profile performed 05/24/2020 revealed a total cholesterol 145, LDL 71 and HDL of 59.   Current Meds  Medication Sig  . acetaminophen (TYLENOL) 325 MG tablet Take 650 mg by mouth every 6 (six) hours as needed.  Marland Kitchen amLODipine (NORVASC) 5 MG tablet TAKE 1 TABLET BY MOUTH EVERY DAY  . cyclobenzaprine (FLEXERIL) 10 MG tablet TAKE 1 TABLET BY MOUTH AT BEDTIME  . DULoxetine (CYMBALTA) 20 MG capsule TAKE 1 CAPSULE BY  MOUTH EVERY DAY  . finasteride ([name]) 5 MG tablet Take 1 tablet (5 mg total) by mouth daily.  Marland Kitchen FLUAD 0.5 ML SUSY TO BE ADMINISTERED BY PHARMACIST FOR IMMUNIZATION  . LORazepam (ATIVAN) 2 MG tablet Take 1 tablet (2 mg total) by mouth every 6 (six) hours as needed for anxiety.  Marland Kitchen losartan (COZAAR) 50 MG tablet TAKE 2 TABLETS BY MOUTH EVERY DAY  . magic mouthwash SOLN Take 10 mLs by mouth 3 (three) times daily as needed for mouth pain.  . Melatonin 10 MG TABS Take by mouth as needed.  Marland Kitchen omeprazole (PRILOSEC) 20 MG capsule TAKE 1 CAPSULE (20 MG TOTAL) BY MOUTH 2 (TWO) TIMES DAILY BEFORE A MEAL.  . rosuvastatin (CRESTOR) 5 MG tablet TAKE 1 TABLET BY MOUTH 2-3 TIMES WEEKLY AS TOLERATED  . sildenafil (REVATIO) 20 MG tablet TAKE 2 5 TABLETS BY MOUTH USE AS DIRECTED AS NEEDED  . tamsulosin (FLOMAX) 0.4 MG CAPS capsule Take 0.4 mg by mouth daily.  Marland Kitchen triamcinolone cream (KENALOG) 0.1 % Apply 1 application topically 2 (two) times daily as needed.     Allergies  Allergen Reactions  . Codeine Other (See Comments)    hallucinations  . Lisinopril Cough    Social History   Socioeconomic History  . Marital status: Single    Spouse name: Not on file  . Number of children: Not on file  . Years of education: Not on file  . Highest education level: Not on file  Occupational History  . Occupation: retired  Comment: teacher  Tobacco Use  . Smoking status: Never Smoker  . Smokeless tobacco: Never Used  Vaping Use  . Vaping Use: Never used  Substance and Sexual Activity  . Alcohol use: No    Alcohol/week: 0.0 standard drinks  . Drug use: No  . Sexual activity: Not on file  Other Topics Concern  . Not on file  Social History Narrative  . Not on file   Social Determinants of Health   Financial Resource Strain: Not on file  Food Insecurity: Not on file  Transportation Needs: Not on file  Physical Activity: Not on file  Stress: Not on file  Social Connections: Not on file  Intimate  Partner Violence: Not on file     Review of Systems: General: negative for chills, fever, night sweats or weight changes.  Cardiovascular: negative for chest pain, dyspnea on exertion, edema, orthopnea, palpitations, paroxysmal nocturnal dyspnea or shortness of breath Dermatological: negative for rash Respiratory: negative for cough or wheezing Urologic: negative for hematuria Abdominal: negative for nausea, vomiting, diarrhea, bright red blood per rectum, melena, or hematemesis Neurologic: negative for visual changes, syncope, or dizziness All other systems reviewed and are otherwise negative except as noted above.    Blood pressure 130/76, pulse 77, height 6\' 1"  (1.854 m), weight 198 lb (89.8 kg), SpO2 99 %.  General appearance: alert and no distress Neck: no adenopathy, no carotid bruit, no JVD, supple, symmetrical, trachea midline and thyroid not enlarged, symmetric, no tenderness/mass/nodules Lungs: clear to auscultation bilaterally Heart: regular rate and rhythm, S1, S2 normal, no murmur, click, rub or gallop Extremities: extremities normal, atraumatic, no cyanosis or edema Pulses: 2+ and symmetric Skin: Skin color, texture, turgor normal. No rashes or lesions Neurologic: Alert and oriented X 3, normal strength and tone. Normal symmetric reflexes. Normal coordination and gait  EKG sinus rhythm at 77 with borderline LVH voltage and early R wave transition.  I personally reviewed this EKG.  ASSESSMENT AND PLAN:   Hyperlipemia, mixed History of hyperlipidemia on statin therapy with lipid profile performed 05/24/2020 revealing total cholesterol of 145, LDL 71 and HDL of 59.  Essential hypertension History of essential hypertension a blood pressure measured today 130/76.  He is on amlodipine and losartan.  Coronary artery calcification seen on CAT scan History of coronary calcification found initially on chest CT.  Ultimately had a coronary calcium score performed 09/09/2019 which  was 1107 with calcifications noted in the LAD and RCA.  He had coronary CTA performed 12/07/2019 revealing borderline lesions in the distal LAD and RCA.  He gets occasional effort angina but this is fairly infrequent.  He did had a negative Myoview performed 06/23/2019.      [name] MD FACP,FACC,FAHA, [facility] 12/05/2020 11:22 AM

## 2020-12-05 NOTE — Assessment & Plan Note (Signed)
History of essential hypertension a blood pressure measured today 130/76.  He is on amlodipine and losartan.

## 2020-12-27 NOTE — Progress Notes (Signed)
Subjective:   [name] is a 70 y.o. male who presents for Medicare Annual/Subsequent preventive examination.  Review of Systems    n/a Cardiac Risk Factors include: advanced age (>31men, >34 women);hypertension;male gender     Objective:    Today's Vitals   12/28/20 1122  BP: 120/70  Pulse: 78  Temp: 98 F (36.7 C)  SpO2: 98%  Weight: 191 lb 12.8 oz (87 kg)   Body mass index is 25.3 kg/m.  Advanced Directives 12/28/2020 02/07/2019 05/03/2018 07/03/2017 06/22/2017 04/29/2017 04/21/2016  Does Patient Have a Medical Advance Directive? Yes Yes Yes No Yes Yes Yes  Type of Advance Directive - Living will;Healthcare Power of Attorney - - Living will;Healthcare Power of Attorney - -  Does patient want to make changes to medical advance directive? No - Patient declined - - - - - -  Copy of [place] in Chart? - - - - - - No - copy requested    Current Medications (verified) Outpatient Encounter Medications as of 12/28/2020  Medication Sig  . acetaminophen (TYLENOL) 325 MG tablet Take 650 mg by mouth every 6 (six) hours as needed.  Marland Kitchen amLODipine (NORVASC) 5 MG tablet TAKE 1 TABLET BY MOUTH EVERY DAY  . cyclobenzaprine (FLEXERIL) 10 MG tablet TAKE 1 TABLET BY MOUTH AT BEDTIME  . DULoxetine (CYMBALTA) 20 MG capsule TAKE 1 CAPSULE BY MOUTH EVERY DAY  . finasteride ([name]) 5 MG tablet Take 1 tablet (5 mg total) by mouth daily.  Marland Kitchen LORazepam (ATIVAN) 2 MG tablet Take 1 tablet (2 mg total) by mouth every 6 (six) hours as needed for anxiety.  Marland Kitchen losartan (COZAAR) 50 MG tablet TAKE 2 TABLETS BY MOUTH EVERY DAY  . omeprazole (PRILOSEC) 20 MG capsule TAKE 1 CAPSULE (20 MG TOTAL) BY MOUTH 2 (TWO) TIMES DAILY BEFORE A MEAL.  . rosuvastatin (CRESTOR) 5 MG tablet TAKE 1 TABLET BY MOUTH 2-3 TIMES WEEKLY AS TOLERATED  . sildenafil (REVATIO) 20 MG tablet TAKE 2 5 TABLETS BY MOUTH USE AS DIRECTED AS NEEDED  . tamsulosin (FLOMAX) 0.4 MG CAPS capsule Take 0.4 mg by mouth daily.  Marland Kitchen  triamcinolone cream (KENALOG) 0.1 % Apply 1 application topically 2 (two) times daily as needed.  Marland Kitchen FLUAD 0.5 ML SUSY TO BE ADMINISTERED BY PHARMACIST FOR IMMUNIZATION  . magic mouthwash SOLN Take 10 mLs by mouth 3 (three) times daily as needed for mouth pain. (Patient not taking: Reported on 12/28/2020)  . Melatonin 10 MG TABS Take by mouth as needed.  . [DISCONTINUED] tadalafil (CIALIS) 20 MG tablet Take 20 mg by mouth daily as needed.     No facility-administered encounter medications on file as of 12/28/2020.    Allergies (verified) Codeine and Lisinopril   History: Past Medical History:  Diagnosis Date  . Anxiety   . Arthritis   . Cancer ([place]) 2010   basal cell ca, head  . Cataract    bilateral  . Depression   . ED (erectile dysfunction)   . GERD (gastroesophageal reflux disease)   . Hyperlipidemia   . Hypertension   . Kidney stone 07-29-11   passed   . 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 Vitreous floater    left eye, sees [facility] Dawna [facility]    Past Surgical History:  Procedure Laterality Date  . BASAL CELL CARCINOMA EXCISION     removed from scalp  . COLONOSCOPY  07/03/2017   per Dr. Havery Moros, sessile serrated polyp,  repeat in 5 yrs   . HERNIA REPAIR     Family History  Problem Relation Age of Onset  . Hypertension Mother   . Hyperlipidemia Mother   . Depression Other   . Diabetes Other   . Hyperlipidemia Other   . Hypertension Other   . Alzheimer's disease Other   . Colon cancer Neg Hx    Social History   Socioeconomic History  . Marital status: Single    Spouse name: Not on file  . Number of children: Not on file  . Years of education: Not on file  . Highest education level: Not on file  Occupational History  . Occupation: retired    Comment: Pharmacist, hospital  Tobacco Use  . Smoking status: Never Smoker  . Smokeless tobacco: Never Used  Vaping Use  . Vaping Use: Never used  Substance and Sexual Activity  . Alcohol use: No     Alcohol/week: 0.0 standard drinks  . Drug use: No  . Sexual activity: Not on file  Other Topics Concern  . Not on file  Social History Narrative  . Not on file   Social Determinants of Health   Financial Resource Strain: Low Risk   . Difficulty of Paying Living Expenses: Not hard at all  Food Insecurity: No Food Insecurity  . Worried About Charity fundraiser in the Last Year: Never true  . Ran Out of Food in the Last Year: Never true  Transportation Needs: No Transportation Needs  . Lack of Transportation (Medical): No  . Lack of Transportation (Non-Medical): No  Physical Activity: Sufficiently Active  . Days of Exercise per Week: 7 days  . Minutes of Exercise per Session: 30 min  Stress: No Stress Concern Present  . Feeling of Stress : Not at all  Social Connections: Socially Isolated  . Frequency of Communication with Friends and Family: More than three times a week  . Frequency of Social Gatherings with Friends and Family: More than three times a week  . Attends Religious Services: Never  . Active Member of Clubs or Organizations: No  . Attends Archivist Meetings: Never  . Marital Status: Never married    Tobacco Counseling Counseling given: Not Answered   Clinical Intake:  Pre-visit preparation completed: Yes  Pain : No/denies pain     Nutritional Risks: None  How often do you need to have someone help you when you read instructions, pamphlets, or other written materials from your doctor or pharmacy?: 1 - Never What is the last grade level you completed in school?: masters  Diabetic?no  Interpreter Needed?: No  Comments: l.Wray Goehring,Lpn   Activities of Daily Living In your present state of health, do you have any difficulty performing the following activities: 12/28/2020  Hearing? N  Vision? N  Difficulty concentrating or making decisions? N  Walking or climbing stairs? N  Dressing or bathing? N  Doing errands, shopping? N  Preparing Food  and eating ? N  Using the Toilet? N  In the past six months, have you accidently leaked urine? N  Do you have problems with loss of bowel control? N  Managing your Medications? N  Managing your Finances? N  Housekeeping or managing your Housekeeping? N  Some recent data might be hidden    Patient Care Team: [name], MD as PCP - General Tat, Eustace Quail, DO as Consulting Physician (Neurology)  Indicate any recent Medical Services you may have received from other than [facility] providers in  the past year (date may be approximate).     Assessment:   This is a routine wellness examination for [name].  Hearing/Vision screen  Hearing Screening   125Hz  250Hz  500Hz  1000Hz  2000Hz  3000Hz  4000Hz  6000Hz  8000Hz   Right ear:           Left ear:           Vision Screening Comments: Wears glasses, annual eye exams   Dietary issues and exercise activities discussed: Current Exercise Habits: Home exercise routine, Type of exercise: walking, Frequency (Times/Week): 7, Intensity: Mild, Exercise limited by: None identified  Goals    . Blood Pressure < 130/80    . Exercise 3x per week (30 min per time)    . patient     Continue to focus on physical activity    . Patient Stated     Engaging with new people, meeting new people  Try to find a barn that will let you ride  [employer] [place] - They have weekend camping and daytime [place] horse  Therapeutic horse farm  Likes classic movies and can check this out Continue to walking   Develop other interest     . Reduce sugar intake to X grams per day     Monitor for HFCS in ketchup; salad dressing, health bars etc  cut back on pasta       Depression Screen PHQ 2/9 Scores 12/28/2020 12/28/2020 08/15/2019 05/03/2018 04/29/2017 04/24/2017 03/07/2016  PHQ - 2 Score 0 0 0 0 0 1 0    Fall Risk Fall Risk  12/28/2020 08/15/2019 02/07/2019 05/03/2018 02/19/2018  Falls in the past year? 0 0 0 No No  Number falls in past yr: 0 0 0 - -  Injury with Fall? 0 0 - -  -  Follow up Falls evaluation completed Falls evaluation completed - - -    FALL RISK PREVENTION PERTAINING TO THE HOME:  Any stairs in or around the home? Yes  If so, are there any without handrails? Yes  Home free of loose throw rugs in walkways, pet beds, electrical cords, etc? Yes  Adequate lighting in your home to reduce risk of falls? Yes   ASSISTIVE DEVICES UTILIZED TO PREVENT FALLS:  Life alert? Yes  Use of a cane, walker or w/c? No  Grab bars in the bathroom? Yes  Shower chair or bench in shower? Yes  Elevated toilet seat or a handicapped toilet? Yes   TIMED UP AND GO:  Was the test performed? Yes .  Length of time to ambulate 10 feet: 6 sec.   Gait steady and fast without use of assistive device  Cognitive Function: Normal cognitive status assessed by direct observation by this Nurse Health Advisor. No abnormalities found.   MMSE - Mini Mental State Exam 05/03/2018 04/29/2017 03/07/2016  Not completed: (No Data) (No Data) -  Orientation to time - - 5  Orientation to Place - - 5  Registration - - 3  Attention/ Calculation - - 5  Recall - - 3  Language- name 2 objects - - 2  Language- repeat - - 1  Language- follow 3 step command - - 3  Language- read & follow direction - - 1  Write a sentence - - 1  Copy design - - 1  Total score - - 30        Immunizations Immunization History  Administered Date(s) Administered  . Fluad Quad(high Dose 65+) 06/15/2019  . Influenza Split 08/13/2011, 06/08/2012, 06/22/2013  . Influenza  Whole 06/22/2005, 06/19/2010  . Influenza, High Dose Seasonal PF 07/05/2017, 05/28/2020  . Influenza-Unspecified 07/14/2015, 07/12/2016, 07/05/2017  . PFIZER(Purple Top)SARS-COV-2 Vaccination 10/31/2019, 11/26/2019, 07/07/2020  . Pneumococcal Conjugate-13 08/11/2013  . Pneumococcal Polysaccharide-23 04/23/2016  . Td 06/19/2010  . Tdap 09/12/2013  . Zoster 08/11/2013    TDAP status: Up to date  Flu Vaccine status: Up to  date  Pneumococcal vaccine status: Up to date  Covid-19 vaccine status: Completed vaccines  Qualifies for Shingles Vaccine? Yes   Zostavax completed Yes   Shingrix Completed?: No.    Education has been provided regarding the importance of this vaccine. Patient has been advised to call insurance company to determine out of pocket expense if they have not yet received this vaccine. Advised may also receive vaccine at local pharmacy or Health Dept. Verbalized acceptance and understanding.  Screening Tests Health Maintenance  Topic Date Due  . INFLUENZA VACCINE  04/22/2021  . COLONOSCOPY (Pts 45-82yrs Insurance coverage will need to be confirmed)  07/03/2022  . TETANUS/TDAP  09/13/2023  . COVID-19 Vaccine  Completed  . Hepatitis C Screening  Completed  . PNA vac Low Risk Adult  Completed  . HPV VACCINES  Aged Out    Health Maintenance  There are no preventive care reminders to display for this patient.  Colorectal cancer screening: Type of screening: Colonoscopy. Completed 07/03/2017. Repeat every 5 years  Lung Cancer Screening: (Low Dose CT Chest recommended if Age 74-80 years, 30 pack-year currently smoking OR have quit w/in 15years.) does not qualify.   Lung Cancer Screening Referral: n/a  Additional Screening:  Hepatitis C Screening: does qualify  Vision Screening: Recommended annual ophthalmology exams for early detection of glaucoma and other disorders of the eye. Is the patient up to date with their annual eye exam?  Yes  Who is the provider or what is the name of the office in which the patient attends annual eye exams? Dr.king If pt is not established with a provider, would they like to be referred to a provider to establish care? No .   Dental Screening: Recommended annual dental exams for proper oral hygiene  Community Resource Referral / Chronic Care Management: CRR required this visit?  No   CCM required this visit?  No      Plan:     I have personally  reviewed and noted the following in the patient's chart:   . Medical and social history . Use of alcohol, tobacco or illicit drugs  . Current medications and supplements . Functional ability and status . Nutritional status . Physical activity . Advanced directives . List of other physicians . Hospitalizations, surgeries, and ER visits in previous 12 months . Vitals . Screenings to include cognitive, depression, and falls . Referrals and appointments  In addition, I have reviewed and discussed with patient certain preventive protocols, quality metrics, and best practice recommendations. A written personalized care plan for preventive services as well as general preventive health recommendations were provided to patient.     [name], LPN   05/27/6386   Nurse Notes: none

## 2020-12-28 ENCOUNTER — Ambulatory Visit (INDEPENDENT_AMBULATORY_CARE_PROVIDER_SITE_OTHER): Payer: Medicare PPO

## 2020-12-28 ENCOUNTER — Other Ambulatory Visit: Payer: Self-pay

## 2020-12-28 VITALS — BP 120/70 | HR 78 | Temp 98.0°F | Wt 191.8 lb

## 2020-12-28 DIAGNOSIS — Z0000 Encounter for general adult medical examination without abnormal findings: Secondary | ICD-10-CM

## 2020-12-28 NOTE — Patient Instructions (Signed)
[name] , Thank you for taking time to come for your Medicare Wellness Visit. I appreciate your ongoing commitment to your health goals. Please review the following plan we discussed and let me know if I can assist you in the future.   Screening recommendations/referrals: Colonoscopy: current 07/03/2022 Recommended yearly ophthalmology/optometry visit for glaucoma screening and checkup Recommended yearly dental visit for hygiene and checkup  Vaccinations: Influenza vaccine: current due in the fall Pneumococcal vaccine: completed series  Tdap vaccine: due obtain upon injury  Shingles vaccine: will complete the series     Advanced directives: will provide copies   Conditions/risks identified: none  Next appointment: none   Preventive Care 2 Years and Older, Male Preventive care refers to lifestyle choices and visits with your health care provider that can promote health and wellness. What does preventive care include?  A yearly physical exam. This is also called an annual well check.  Dental exams once or twice a year.  Routine eye exams. Ask your health care provider how often you should have your eyes checked.  Personal lifestyle choices, including:  Daily care of your teeth and gums.  Regular physical activity.  Eating a healthy diet.  Avoiding tobacco and drug use.  Limiting alcohol use.  Practicing safe sex.  Taking low doses of aspirin every day.  Taking vitamin and mineral supplements as recommended by your health care provider. What happens during an annual well check? The services and screenings done by your health care provider during your annual well check will depend on your age, overall health, lifestyle risk factors, and family history of disease. Counseling  Your health care provider may ask you questions about your:  Alcohol use.  Tobacco use.  Drug use.  Emotional well-being.  Home and relationship well-being.  Sexual  activity.  Eating habits.  History of falls.  Memory and ability to understand (cognition).  Work and work Statistician. Screening  You may have the following tests or measurements:  Height, weight, and BMI.  Blood pressure.  Lipid and cholesterol levels. These may be checked every 5 years, or more frequently if you are over 69 years old.  Skin check.  Lung cancer screening. You may have this screening every year starting at age 26 if you have a 30-pack-year history of smoking and currently smoke or have quit within the past 15 years.  Fecal occult blood test (FOBT) of the stool. You may have this test every year starting at age 59.  Flexible sigmoidoscopy or colonoscopy. You may have a sigmoidoscopy every 5 years or a colonoscopy every 10 years starting at age 44.  Prostate cancer screening. Recommendations will vary depending on your family history and other risks.  Hepatitis C blood test.  Hepatitis B blood test.  Sexually transmitted disease (STD) testing.  Diabetes screening. This is done by checking your blood sugar (glucose) after you have not eaten for a while (fasting). You may have this done every 1-3 years.  Abdominal aortic aneurysm (AAA) screening. You may need this if you are a current or former smoker.  Osteoporosis. You may be screened starting at age 73 if you are at high risk. Talk with your health care provider about your test results, treatment options, and if necessary, the need for more tests. Vaccines  Your health care provider may recommend certain vaccines, such as:  Influenza vaccine. This is recommended every year.  Tetanus, diphtheria, and acellular pertussis (Tdap, Td) vaccine. You may need a Td booster every 10  years.  Zoster vaccine. You may need this after age 92.  Pneumococcal 13-valent conjugate (PCV13) vaccine. One dose is recommended after age 61.  Pneumococcal polysaccharide (PPSV23) vaccine. One dose is recommended after age  72. Talk to your health care provider about which screenings and vaccines you need and how often you need them. This information is not intended to replace advice given to you by your health care provider. Make sure you discuss any questions you have with your health care provider. Document Released: 10/05/2015 Document Revised: 05/28/2016 Document Reviewed: 07/10/2015 Elsevier Interactive Patient Education  2017 [place] Prevention in the Home Falls can cause injuries. They can happen to people of all ages. There are many things you can do to make your home safe and to help prevent falls. What can I do on the outside of my home?  Regularly fix the edges of walkways and driveways and fix any cracks.  Remove anything that might make you trip as you walk through a door, such as a raised step or threshold.  Trim any bushes or trees on the path to your home.  Use bright outdoor lighting.  Clear any walking paths of anything that might make someone trip, such as rocks or tools.  Regularly check to see if handrails are loose or broken. Make sure that both sides of any steps have handrails.  Any raised decks and porches should have guardrails on the edges.  Have any leaves, snow, or ice cleared regularly.  Use sand or salt on walking paths during winter.  Clean up any spills in your garage right away. This includes oil or grease spills. What can I do in the bathroom?  Use night lights.  Install grab bars by the toilet and in the tub and shower. Do not use towel bars as grab bars.  Use non-skid mats or decals in the tub or shower.  If you need to sit down in the shower, use a plastic, non-slip stool.  Keep the floor dry. Clean up any water that spills on the floor as soon as it happens.  Remove soap buildup in the tub or shower regularly.  Attach bath mats securely with double-sided non-slip rug tape.  Do not have throw rugs and other things on the floor that can make  you trip. What can I do in the bedroom?  Use night lights.  Make sure that you have a light by your bed that is easy to reach.  Do not use any sheets or blankets that are too big for your bed. They should not hang down onto the floor.  Have a firm chair that has side arms. You can use this for support while you get dressed.  Do not have throw rugs and other things on the floor that can make you trip. What can I do in the kitchen?  Clean up any spills right away.  Avoid walking on wet floors.  Keep items that you use a lot in easy-to-reach places.  If you need to reach something above you, use a strong step stool that has a grab bar.  Keep electrical cords out of the way.  Do not use floor polish or wax that makes floors slippery. If you must use wax, use non-skid floor wax.  Do not have throw rugs and other things on the floor that can make you trip. What can I do with my stairs?  Do not leave any items on the stairs.  Make sure that  there are handrails on both sides of the stairs and use them. Fix handrails that are broken or loose. Make sure that handrails are as long as the stairways.  Check any carpeting to make sure that it is firmly attached to the stairs. Fix any carpet that is loose or worn.  Avoid having throw rugs at the top or bottom of the stairs. If you do have throw rugs, attach them to the floor with carpet tape.  Make sure that you have a light switch at the top of the stairs and the bottom of the stairs. If you do not have them, ask someone to add them for you. What else can I do to help prevent falls?  Wear shoes that:  Do not have high heels.  Have rubber bottoms.  Are comfortable and fit you well.  Are closed at the toe. Do not wear sandals.  If you use a stepladder:  Make sure that it is fully opened. Do not climb a closed stepladder.  Make sure that both sides of the stepladder are locked into place.  Ask someone to hold it for you, if  possible.  Clearly mark and make sure that you can see:  Any grab bars or handrails.  First and last steps.  Where the edge of each step is.  Use tools that help you move around (mobility aids) if they are needed. These include:  Canes.  Walkers.  Scooters.  Crutches.  Turn on the lights when you go into a dark area. Replace any light bulbs as soon as they burn out.  Set up your furniture so you have a clear path. Avoid moving your furniture around.  If any of your floors are uneven, fix them.  If there are any pets around you, be aware of where they are.  Review your medicines with your doctor. Some medicines can make you feel dizzy. This can increase your chance of falling. Ask your doctor what other things that you can do to help prevent falls. This information is not intended to replace advice given to you by your health care provider. Make sure you discuss any questions you have with your health care provider. Document Released: 07/05/2009 Document Revised: 02/14/2016 Document Reviewed: 10/13/2014 Elsevier Interactive Patient Education  2017 [employer].

## 2021-01-03 ENCOUNTER — Encounter (HOSPITAL_COMMUNITY)
Admission: EM | Disposition: A | Discharge status: Home Health | Payer: Self-pay | Source: Home / Self Care | Attending: Internal Medicine

## 2021-01-03 ENCOUNTER — Inpatient Hospital Stay (HOSPITAL_COMMUNITY): Payer: Medicare PPO

## 2021-01-03 ENCOUNTER — Encounter (HOSPITAL_COMMUNITY): Payer: Self-pay | Admitting: Cardiovascular Disease

## 2021-01-03 ENCOUNTER — Encounter (HOSPITAL_COMMUNITY): Payer: Self-pay | Admitting: Certified Registered Nurse Anesthetist

## 2021-01-03 ENCOUNTER — Emergency Department (HOSPITAL_COMMUNITY): Payer: Medicare PPO | Admitting: Certified Registered Nurse Anesthetist

## 2021-01-03 ENCOUNTER — Emergency Department (HOSPITAL_COMMUNITY): Payer: Medicare PPO

## 2021-01-03 ENCOUNTER — Inpatient Hospital Stay (HOSPITAL_COMMUNITY)
Admission: EM | Admit: 2021-01-03 | Discharge: 2021-02-07 | DRG: 215 | Disposition: A | Discharge status: Home Health | Payer: Medicare PPO | Attending: Internal Medicine | Admitting: Internal Medicine

## 2021-01-03 DIAGNOSIS — E785 Hyperlipidemia, unspecified: Secondary | ICD-10-CM | POA: Diagnosis present

## 2021-01-03 DIAGNOSIS — N170 Acute kidney failure with tubular necrosis: Secondary | ICD-10-CM | POA: Diagnosis not present

## 2021-01-03 DIAGNOSIS — R509 Fever, unspecified: Secondary | ICD-10-CM | POA: Diagnosis not present

## 2021-01-03 DIAGNOSIS — G9341 Metabolic encephalopathy: Secondary | ICD-10-CM | POA: Diagnosis not present

## 2021-01-03 DIAGNOSIS — Z6831 Body mass index (BMI) 31.0-31.9, adult: Secondary | ICD-10-CM

## 2021-01-03 DIAGNOSIS — E875 Hyperkalemia: Secondary | ICD-10-CM | POA: Diagnosis not present

## 2021-01-03 DIAGNOSIS — Z885 Allergy status to narcotic agent status: Secondary | ICD-10-CM

## 2021-01-03 DIAGNOSIS — R042 Hemoptysis: Secondary | ICD-10-CM

## 2021-01-03 DIAGNOSIS — Z9911 Dependence on respirator [ventilator] status: Secondary | ICD-10-CM | POA: Diagnosis not present

## 2021-01-03 DIAGNOSIS — I2119 ST elevation (STEMI) myocardial infarction involving other coronary artery of inferior wall: Secondary | ICD-10-CM | POA: Diagnosis not present

## 2021-01-03 DIAGNOSIS — Z452 Encounter for adjustment and management of vascular access device: Secondary | ICD-10-CM

## 2021-01-03 DIAGNOSIS — Z833 Family history of diabetes mellitus: Secondary | ICD-10-CM

## 2021-01-03 DIAGNOSIS — I959 Hypotension, unspecified: Secondary | ICD-10-CM | POA: Diagnosis not present

## 2021-01-03 DIAGNOSIS — I2111 ST elevation (STEMI) myocardial infarction involving right coronary artery: Secondary | ICD-10-CM | POA: Diagnosis not present

## 2021-01-03 DIAGNOSIS — Z515 Encounter for palliative care: Secondary | ICD-10-CM | POA: Diagnosis not present

## 2021-01-03 DIAGNOSIS — Z79899 Other long term (current) drug therapy: Secondary | ICD-10-CM

## 2021-01-03 DIAGNOSIS — G7281 Critical illness myopathy: Secondary | ICD-10-CM | POA: Diagnosis not present

## 2021-01-03 DIAGNOSIS — I517 Cardiomegaly: Secondary | ICD-10-CM | POA: Diagnosis not present

## 2021-01-03 DIAGNOSIS — T17998A Other foreign object in respiratory tract, part unspecified causing other injury, initial encounter: Secondary | ICD-10-CM | POA: Diagnosis not present

## 2021-01-03 DIAGNOSIS — R1311 Dysphagia, oral phase: Secondary | ICD-10-CM | POA: Diagnosis not present

## 2021-01-03 DIAGNOSIS — G9340 Encephalopathy, unspecified: Secondary | ICD-10-CM | POA: Diagnosis not present

## 2021-01-03 DIAGNOSIS — Z4682 Encounter for fitting and adjustment of non-vascular catheter: Secondary | ICD-10-CM | POA: Diagnosis not present

## 2021-01-03 DIAGNOSIS — E870 Hyperosmolality and hypernatremia: Secondary | ICD-10-CM | POA: Diagnosis not present

## 2021-01-03 DIAGNOSIS — I25119 Atherosclerotic heart disease of native coronary artery with unspecified angina pectoris: Secondary | ICD-10-CM | POA: Diagnosis present

## 2021-01-03 DIAGNOSIS — R0902 Hypoxemia: Secondary | ICD-10-CM

## 2021-01-03 DIAGNOSIS — L89626 Pressure-induced deep tissue damage of left heel: Secondary | ICD-10-CM | POA: Diagnosis present

## 2021-01-03 DIAGNOSIS — Z85828 Personal history of other malignant neoplasm of skin: Secondary | ICD-10-CM

## 2021-01-03 DIAGNOSIS — J810 Acute pulmonary edema: Secondary | ICD-10-CM | POA: Diagnosis not present

## 2021-01-03 DIAGNOSIS — R570 Cardiogenic shock: Secondary | ICD-10-CM | POA: Diagnosis not present

## 2021-01-03 DIAGNOSIS — I5021 Acute systolic (congestive) heart failure: Secondary | ICD-10-CM | POA: Diagnosis not present

## 2021-01-03 DIAGNOSIS — I213 ST elevation (STEMI) myocardial infarction of unspecified site: Secondary | ICD-10-CM | POA: Diagnosis not present

## 2021-01-03 DIAGNOSIS — Z20822 Contact with and (suspected) exposure to covid-19: Secondary | ICD-10-CM | POA: Diagnosis present

## 2021-01-03 DIAGNOSIS — I2582 Chronic total occlusion of coronary artery: Secondary | ICD-10-CM | POA: Diagnosis present

## 2021-01-03 DIAGNOSIS — R569 Unspecified convulsions: Secondary | ICD-10-CM | POA: Diagnosis not present

## 2021-01-03 DIAGNOSIS — K5900 Constipation, unspecified: Secondary | ICD-10-CM | POA: Diagnosis not present

## 2021-01-03 DIAGNOSIS — R491 Aphonia: Secondary | ICD-10-CM | POA: Diagnosis present

## 2021-01-03 DIAGNOSIS — I255 Ischemic cardiomyopathy: Secondary | ICD-10-CM | POA: Diagnosis present

## 2021-01-03 DIAGNOSIS — R001 Bradycardia, unspecified: Secondary | ICD-10-CM | POA: Diagnosis present

## 2021-01-03 DIAGNOSIS — F802 Mixed receptive-expressive language disorder: Secondary | ICD-10-CM | POA: Diagnosis present

## 2021-01-03 DIAGNOSIS — J80 Acute respiratory distress syndrome: Secondary | ICD-10-CM | POA: Diagnosis not present

## 2021-01-03 DIAGNOSIS — J984 Other disorders of lung: Secondary | ICD-10-CM | POA: Diagnosis not present

## 2021-01-03 DIAGNOSIS — B965 Pseudomonas (aeruginosa) (mallei) (pseudomallei) as the cause of diseases classified elsewhere: Secondary | ICD-10-CM | POA: Diagnosis present

## 2021-01-03 DIAGNOSIS — T361X5A Adverse effect of cephalosporins and other beta-lactam antibiotics, initial encounter: Secondary | ICD-10-CM | POA: Diagnosis not present

## 2021-01-03 DIAGNOSIS — G5790 Unspecified mononeuropathy of unspecified lower limb: Secondary | ICD-10-CM | POA: Diagnosis present

## 2021-01-03 DIAGNOSIS — Z01818 Encounter for other preprocedural examination: Secondary | ICD-10-CM

## 2021-01-03 DIAGNOSIS — D62 Acute posthemorrhagic anemia: Secondary | ICD-10-CM | POA: Diagnosis not present

## 2021-01-03 DIAGNOSIS — J189 Pneumonia, unspecified organism: Secondary | ICD-10-CM | POA: Diagnosis not present

## 2021-01-03 DIAGNOSIS — Z978 Presence of other specified devices: Secondary | ICD-10-CM | POA: Diagnosis not present

## 2021-01-03 DIAGNOSIS — D696 Thrombocytopenia, unspecified: Secondary | ICD-10-CM | POA: Diagnosis not present

## 2021-01-03 DIAGNOSIS — I469 Cardiac arrest, cause unspecified: Secondary | ICD-10-CM | POA: Diagnosis not present

## 2021-01-03 DIAGNOSIS — I509 Heart failure, unspecified: Secondary | ICD-10-CM

## 2021-01-03 DIAGNOSIS — J9601 Acute respiratory failure with hypoxia: Secondary | ICD-10-CM | POA: Diagnosis not present

## 2021-01-03 DIAGNOSIS — Z8249 Family history of ischemic heart disease and other diseases of the circulatory system: Secondary | ICD-10-CM

## 2021-01-03 DIAGNOSIS — I6340 Cerebral infarction due to embolism of unspecified cerebral artery: Secondary | ICD-10-CM | POA: Diagnosis not present

## 2021-01-03 DIAGNOSIS — N179 Acute kidney failure, unspecified: Secondary | ICD-10-CM

## 2021-01-03 DIAGNOSIS — K219 Gastro-esophageal reflux disease without esophagitis: Secondary | ICD-10-CM | POA: Diagnosis present

## 2021-01-03 DIAGNOSIS — Z66 Do not resuscitate: Secondary | ICD-10-CM | POA: Diagnosis not present

## 2021-01-03 DIAGNOSIS — N390 Urinary tract infection, site not specified: Secondary | ICD-10-CM | POA: Diagnosis not present

## 2021-01-03 DIAGNOSIS — F419 Anxiety disorder, unspecified: Secondary | ICD-10-CM | POA: Diagnosis present

## 2021-01-03 DIAGNOSIS — I10 Essential (primary) hypertension: Secondary | ICD-10-CM | POA: Diagnosis not present

## 2021-01-03 DIAGNOSIS — E11649 Type 2 diabetes mellitus with hypoglycemia without coma: Secondary | ICD-10-CM | POA: Diagnosis not present

## 2021-01-03 DIAGNOSIS — I2510 Atherosclerotic heart disease of native coronary artery without angina pectoris: Secondary | ICD-10-CM

## 2021-01-03 DIAGNOSIS — N281 Cyst of kidney, acquired: Secondary | ICD-10-CM | POA: Diagnosis not present

## 2021-01-03 DIAGNOSIS — Z83438 Family history of other disorder of lipoprotein metabolism and other lipidemia: Secondary | ICD-10-CM

## 2021-01-03 DIAGNOSIS — K7200 Acute and subacute hepatic failure without coma: Secondary | ICD-10-CM | POA: Diagnosis present

## 2021-01-03 DIAGNOSIS — M47816 Spondylosis without myelopathy or radiculopathy, lumbar region: Secondary | ICD-10-CM | POA: Diagnosis not present

## 2021-01-03 DIAGNOSIS — R0789 Other chest pain: Secondary | ICD-10-CM | POA: Diagnosis not present

## 2021-01-03 DIAGNOSIS — R1313 Dysphagia, pharyngeal phase: Secondary | ICD-10-CM | POA: Diagnosis not present

## 2021-01-03 DIAGNOSIS — R4189 Other symptoms and signs involving cognitive functions and awareness: Secondary | ICD-10-CM | POA: Diagnosis not present

## 2021-01-03 DIAGNOSIS — I4901 Ventricular fibrillation: Secondary | ICD-10-CM | POA: Diagnosis not present

## 2021-01-03 DIAGNOSIS — D689 Coagulation defect, unspecified: Secondary | ICD-10-CM | POA: Diagnosis present

## 2021-01-03 DIAGNOSIS — I639 Cerebral infarction, unspecified: Secondary | ICD-10-CM | POA: Diagnosis not present

## 2021-01-03 DIAGNOSIS — I5041 Acute combined systolic (congestive) and diastolic (congestive) heart failure: Secondary | ICD-10-CM | POA: Diagnosis not present

## 2021-01-03 DIAGNOSIS — N529 Male erectile dysfunction, unspecified: Secondary | ICD-10-CM | POA: Diagnosis present

## 2021-01-03 DIAGNOSIS — Z87442 Personal history of urinary calculi: Secondary | ICD-10-CM

## 2021-01-03 DIAGNOSIS — N171 Acute kidney failure with acute cortical necrosis: Secondary | ICD-10-CM | POA: Diagnosis not present

## 2021-01-03 DIAGNOSIS — E860 Dehydration: Secondary | ICD-10-CM | POA: Diagnosis not present

## 2021-01-03 DIAGNOSIS — D649 Anemia, unspecified: Secondary | ICD-10-CM | POA: Diagnosis not present

## 2021-01-03 DIAGNOSIS — Z818 Family history of other mental and behavioral disorders: Secondary | ICD-10-CM

## 2021-01-03 DIAGNOSIS — R079 Chest pain, unspecified: Secondary | ICD-10-CM | POA: Diagnosis not present

## 2021-01-03 DIAGNOSIS — Z888 Allergy status to other drugs, medicaments and biological substances status: Secondary | ICD-10-CM

## 2021-01-03 DIAGNOSIS — G931 Anoxic brain damage, not elsewhere classified: Secondary | ICD-10-CM | POA: Diagnosis not present

## 2021-01-03 DIAGNOSIS — I5020 Unspecified systolic (congestive) heart failure: Secondary | ICD-10-CM | POA: Diagnosis not present

## 2021-01-03 DIAGNOSIS — F32A Depression, unspecified: Secondary | ICD-10-CM | POA: Diagnosis present

## 2021-01-03 DIAGNOSIS — I440 Atrioventricular block, first degree: Secondary | ICD-10-CM | POA: Diagnosis not present

## 2021-01-03 DIAGNOSIS — I5023 Acute on chronic systolic (congestive) heart failure: Secondary | ICD-10-CM | POA: Diagnosis not present

## 2021-01-03 DIAGNOSIS — E872 Acidosis: Secondary | ICD-10-CM | POA: Diagnosis present

## 2021-01-03 DIAGNOSIS — R5381 Other malaise: Secondary | ICD-10-CM | POA: Diagnosis not present

## 2021-01-03 DIAGNOSIS — E1165 Type 2 diabetes mellitus with hyperglycemia: Secondary | ICD-10-CM | POA: Diagnosis present

## 2021-01-03 DIAGNOSIS — I462 Cardiac arrest due to underlying cardiac condition: Secondary | ICD-10-CM | POA: Diagnosis present

## 2021-01-03 DIAGNOSIS — R4701 Aphasia: Secondary | ICD-10-CM | POA: Diagnosis not present

## 2021-01-03 DIAGNOSIS — R0989 Other specified symptoms and signs involving the circulatory and respiratory systems: Secondary | ICD-10-CM

## 2021-01-03 DIAGNOSIS — I110 Hypertensive heart disease with heart failure: Secondary | ICD-10-CM | POA: Diagnosis present

## 2021-01-03 DIAGNOSIS — Z9289 Personal history of other medical treatment: Secondary | ICD-10-CM

## 2021-01-03 DIAGNOSIS — J9690 Respiratory failure, unspecified, unspecified whether with hypoxia or hypercapnia: Secondary | ICD-10-CM | POA: Diagnosis not present

## 2021-01-03 DIAGNOSIS — Z7982 Long term (current) use of aspirin: Secondary | ICD-10-CM

## 2021-01-03 DIAGNOSIS — J90 Pleural effusion, not elsewhere classified: Secondary | ICD-10-CM | POA: Diagnosis not present

## 2021-01-03 DIAGNOSIS — Z4659 Encounter for fitting and adjustment of other gastrointestinal appliance and device: Secondary | ICD-10-CM

## 2021-01-03 DIAGNOSIS — E1141 Type 2 diabetes mellitus with diabetic mononeuropathy: Secondary | ICD-10-CM | POA: Diagnosis present

## 2021-01-03 DIAGNOSIS — E8779 Other fluid overload: Secondary | ICD-10-CM | POA: Diagnosis not present

## 2021-01-03 DIAGNOSIS — E876 Hypokalemia: Secondary | ICD-10-CM | POA: Diagnosis not present

## 2021-01-03 DIAGNOSIS — M4186 Other forms of scoliosis, lumbar region: Secondary | ICD-10-CM | POA: Diagnosis not present

## 2021-01-03 DIAGNOSIS — Z820 Family history of epilepsy and other diseases of the nervous system: Secondary | ICD-10-CM

## 2021-01-03 DIAGNOSIS — Z7189 Other specified counseling: Secondary | ICD-10-CM | POA: Diagnosis not present

## 2021-01-03 DIAGNOSIS — J9811 Atelectasis: Secondary | ICD-10-CM | POA: Diagnosis not present

## 2021-01-03 DIAGNOSIS — I5043 Acute on chronic combined systolic (congestive) and diastolic (congestive) heart failure: Secondary | ICD-10-CM | POA: Diagnosis present

## 2021-01-03 DIAGNOSIS — R630 Anorexia: Secondary | ICD-10-CM | POA: Diagnosis not present

## 2021-01-03 DIAGNOSIS — R739 Hyperglycemia, unspecified: Secondary | ICD-10-CM | POA: Diagnosis not present

## 2021-01-03 DIAGNOSIS — Z95811 Presence of heart assist device: Secondary | ICD-10-CM | POA: Diagnosis not present

## 2021-01-03 HISTORY — PX: RIGHT HEART CATH: CATH118263

## 2021-01-03 HISTORY — PX: LEFT HEART CATH AND CORONARY ANGIOGRAPHY: CATH118249

## 2021-01-03 LAB — POCT I-STAT 7, (LYTES, BLD GAS, ICA,H+H)
Acid-Base Excess: 1 mmol/L (ref 0.0–2.0)
Acid-base deficit: 5 mmol/L — ABNORMAL HIGH (ref 0.0–2.0)
Acid-base deficit: 6 mmol/L — ABNORMAL HIGH (ref 0.0–2.0)
Acid-base deficit: 11 mmol/L — ABNORMAL HIGH (ref 0.0–2.0)
Acid-base deficit: 13 mmol/L — ABNORMAL HIGH (ref 0.0–2.0)
Acid-base deficit: 11 mmol/L — ABNORMAL HIGH (ref 0.0–2.0)
Acid-base deficit: 12 mmol/L — ABNORMAL HIGH (ref 0.0–2.0)
Bicarbonate: 17.1 mmol/L — ABNORMAL LOW (ref 20.0–28.0)
Bicarbonate: 22 mmol/L (ref 20.0–28.0)
Bicarbonate: 26 mmol/L (ref 20.0–28.0)
Bicarbonate: 14.1 mmol/L — ABNORMAL LOW (ref 20.0–28.0)
Bicarbonate: 13.3 mmol/L — ABNORMAL LOW (ref 20.0–28.0)
Bicarbonate: 13.8 mmol/L — ABNORMAL LOW (ref 20.0–28.0)
Bicarbonate: 13.5 mmol/L — ABNORMAL LOW (ref 20.0–28.0)
Calcium, Ion: 1.07 mmol/L — ABNORMAL LOW (ref 1.15–1.40)
Calcium, Ion: 1.08 mmol/L — ABNORMAL LOW (ref 1.15–1.40)
Calcium, Ion: 0.9 mmol/L — ABNORMAL LOW (ref 1.15–1.40)
Calcium, Ion: 1.01 mmol/L — ABNORMAL LOW (ref 1.15–1.40)
Calcium, Ion: 0.99 mmol/L — ABNORMAL LOW (ref 1.15–1.40)
Calcium, Ion: 0.96 mmol/L — ABNORMAL LOW (ref 1.15–1.40)
Calcium, Ion: 0.96 mmol/L — ABNORMAL LOW (ref 1.15–1.40)
HCT: 33 % — ABNORMAL LOW (ref 39.0–52.0)
HCT: 38 % — ABNORMAL LOW (ref 39.0–52.0)
HCT: 23 % — ABNORMAL LOW (ref 39.0–52.0)
HCT: 45 % (ref 39.0–52.0)
HCT: 38 % — ABNORMAL LOW (ref 39.0–52.0)
HCT: 39 % (ref 39.0–52.0)
HCT: 39 % (ref 39.0–52.0)
Hemoglobin: 11.2 g/dL — ABNORMAL LOW (ref 13.0–17.0)
Hemoglobin: 12.9 g/dL — ABNORMAL LOW (ref 13.0–17.0)
Hemoglobin: 7.8 g/dL — ABNORMAL LOW (ref 13.0–17.0)
Hemoglobin: 15.3 g/dL (ref 13.0–17.0)
Hemoglobin: 12.9 g/dL — ABNORMAL LOW (ref 13.0–17.0)
Hemoglobin: 13.3 g/dL (ref 13.0–17.0)
Hemoglobin: 13.3 g/dL (ref 13.0–17.0)
O2 Saturation: 100 %
O2 Saturation: 100 %
O2 Saturation: 100 %
O2 Saturation: 96 %
O2 Saturation: 94 %
O2 Saturation: 95 %
O2 Saturation: 95 %
Patient temperature: 35.6
Patient temperature: 36.9
Patient temperature: 36.5
Patient temperature: 36.5
Potassium: 4.4 mmol/L (ref 3.5–5.1)
Potassium: 3.5 mmol/L (ref 3.5–5.1)
Potassium: 2.8 mmol/L — ABNORMAL LOW (ref 3.5–5.1)
Potassium: 3.2 mmol/L — ABNORMAL LOW (ref 3.5–5.1)
Potassium: 2.9 mmol/L — ABNORMAL LOW (ref 3.5–5.1)
Potassium: 2.6 mmol/L — CL (ref 3.5–5.1)
Potassium: 2.5 mmol/L — CL (ref 3.5–5.1)
Sodium: 139 mmol/L (ref 135–145)
Sodium: 139 mmol/L (ref 135–145)
Sodium: 145 mmol/L (ref 135–145)
Sodium: 139 mmol/L (ref 135–145)
Sodium: 140 mmol/L (ref 135–145)
Sodium: 141 mmol/L (ref 135–145)
Sodium: 140 mmol/L (ref 135–145)
TCO2: 18 mmol/L — ABNORMAL LOW (ref 22–32)
TCO2: 24 mmol/L (ref 22–32)
TCO2: 27 mmol/L (ref 22–32)
TCO2: 15 mmol/L — ABNORMAL LOW (ref 22–32)
TCO2: 14 mmol/L — ABNORMAL LOW (ref 22–32)
TCO2: 15 mmol/L — ABNORMAL LOW (ref 22–32)
TCO2: 14 mmol/L — ABNORMAL LOW (ref 22–32)
pCO2 arterial: 22.5 mmHg — ABNORMAL LOW (ref 32.0–48.0)
pCO2 arterial: 50.4 mmHg — ABNORMAL HIGH (ref 32.0–48.0)
pCO2 arterial: 40.9 mmHg (ref 32.0–48.0)
pCO2 arterial: 28.1 mmHg — ABNORMAL LOW (ref 32.0–48.0)
pCO2 arterial: 32.5 mmHg (ref 32.0–48.0)
pCO2 arterial: 28.1 mmHg — ABNORMAL LOW (ref 32.0–48.0)
pCO2 arterial: 27.5 mmHg — ABNORMAL LOW (ref 32.0–48.0)
pH, Arterial: 7.489 — ABNORMAL HIGH (ref 7.350–7.450)
pH, Arterial: 7.249 — ABNORMAL LOW (ref 7.350–7.450)
pH, Arterial: 7.411 (ref 7.350–7.450)
pH, Arterial: 7.302 — ABNORMAL LOW (ref 7.350–7.450)
pH, Arterial: 7.219 — ABNORMAL LOW (ref 7.350–7.450)
pH, Arterial: 7.295 — ABNORMAL LOW (ref 7.350–7.450)
pH, Arterial: 7.294 — ABNORMAL LOW (ref 7.350–7.450)
pO2, Arterial: 240 mmHg — ABNORMAL HIGH (ref 83.0–108.0)
pO2, Arterial: 292 mmHg — ABNORMAL HIGH (ref 83.0–108.0)
pO2, Arterial: 370 mmHg — ABNORMAL HIGH (ref 83.0–108.0)
pO2, Arterial: 84 mmHg (ref 83.0–108.0)
pO2, Arterial: 85 mmHg (ref 83.0–108.0)
pO2, Arterial: 80 mmHg — ABNORMAL LOW (ref 83.0–108.0)
pO2, Arterial: 82 mmHg — ABNORMAL LOW (ref 83.0–108.0)

## 2021-01-03 LAB — POCT I-STAT EG7
Acid-base deficit: 4 mmol/L — ABNORMAL HIGH (ref 0.0–2.0)
Bicarbonate: 21.9 mmol/L (ref 20.0–28.0)
Calcium, Ion: 1.07 mmol/L — ABNORMAL LOW (ref 1.15–1.40)
HCT: 34 % — ABNORMAL LOW (ref 39.0–52.0)
Hemoglobin: 11.6 g/dL — ABNORMAL LOW (ref 13.0–17.0)
O2 Saturation: 54 %
Potassium: 3.9 mmol/L (ref 3.5–5.1)
Sodium: 138 mmol/L (ref 135–145)
TCO2: 23 mmol/L (ref 22–32)
pCO2, Ven: 42.3 mmHg — ABNORMAL LOW (ref 44.0–60.0)
pH, Ven: 7.321 (ref 7.250–7.430)
pO2, Ven: 31 mmHg — CL (ref 32.0–45.0)

## 2021-01-03 LAB — BASIC METABOLIC PANEL
Anion gap: 15 (ref 5–15)
Anion gap: 16 — ABNORMAL HIGH (ref 5–15)
BUN: 21 mg/dL (ref 8–23)
BUN: 21 mg/dL (ref 8–23)
CO2: 14 mmol/L — ABNORMAL LOW (ref 22–32)
CO2: 14 mmol/L — ABNORMAL LOW (ref 22–32)
Calcium: 6.5 mg/dL — ABNORMAL LOW (ref 8.9–10.3)
Calcium: 6.5 mg/dL — ABNORMAL LOW (ref 8.9–10.3)
Chloride: 108 mmol/L (ref 98–111)
Chloride: 108 mmol/L (ref 98–111)
Creatinine, Ser: 1.58 mg/dL — ABNORMAL HIGH (ref 0.61–1.24)
Creatinine, Ser: 1.65 mg/dL — ABNORMAL HIGH (ref 0.61–1.24)
GFR, Estimated: 47 mL/min — ABNORMAL LOW (ref >60)
GFR, Estimated: 44 mL/min — ABNORMAL LOW (ref >60)
Glucose, Bld: 408 mg/dL — ABNORMAL HIGH (ref 70–99)
Glucose, Bld: 427 mg/dL — ABNORMAL HIGH (ref 70–99)
Potassium: 3 mmol/L — ABNORMAL LOW (ref 3.5–5.1)
Potassium: 2.7 mmol/L — CL (ref 3.5–5.1)
Sodium: 137 mmol/L (ref 135–145)
Sodium: 138 mmol/L (ref 135–145)

## 2021-01-03 LAB — CBC
HCT: 32.4 % — ABNORMAL LOW (ref 39.0–52.0)
HCT: 43.8 % (ref 39.0–52.0)
HCT: 39.1 % (ref 39.0–52.0)
Hemoglobin: 11 g/dL — ABNORMAL LOW (ref 13.0–17.0)
Hemoglobin: 15 g/dL (ref 13.0–17.0)
Hemoglobin: 13.3 g/dL (ref 13.0–17.0)
MCH: 31 pg (ref 26.0–34.0)
MCH: 30.5 pg (ref 26.0–34.0)
MCH: 30.6 pg (ref 26.0–34.0)
MCHC: 34 g/dL (ref 30.0–36.0)
MCHC: 34.2 g/dL (ref 30.0–36.0)
MCHC: 34 g/dL (ref 30.0–36.0)
MCV: 91.3 fL (ref 80.0–100.0)
MCV: 89 fL (ref 80.0–100.0)
MCV: 89.9 fL (ref 80.0–100.0)
Platelets: 215 10*3/uL (ref 150–400)
Platelets: 165 10*3/uL (ref 150–400)
Platelets: 150 10*3/uL (ref 150–400)
RBC: 3.55 MIL/uL — ABNORMAL LOW (ref 4.22–5.81)
RBC: 4.92 MIL/uL (ref 4.22–5.81)
RBC: 4.35 MIL/uL (ref 4.22–5.81)
RDW: 12.8 % (ref 11.5–15.5)
RDW: 13.8 % (ref 11.5–15.5)
RDW: 14.3 % (ref 11.5–15.5)
WBC: 25.3 10*3/uL — ABNORMAL HIGH (ref 4.0–10.5)
WBC: 24 10*3/uL — ABNORMAL HIGH (ref 4.0–10.5)
WBC: 22 10*3/uL — ABNORMAL HIGH (ref 4.0–10.5)
nRBC: 0 % (ref 0.0–0.2)
nRBC: 0 % (ref 0.0–0.2)
nRBC: 0 % (ref 0.0–0.2)

## 2021-01-03 LAB — POCT I-STAT, CHEM 8
BUN: 19 mg/dL (ref 8–23)
Calcium, Ion: 1.08 mmol/L — ABNORMAL LOW (ref 1.15–1.40)
Chloride: 107 mmol/L (ref 98–111)
Creatinine, Ser: 0.9 mg/dL (ref 0.61–1.24)
Glucose, Bld: 351 mg/dL — ABNORMAL HIGH (ref 70–99)
HCT: 31 % — ABNORMAL LOW (ref 39.0–52.0)
Hemoglobin: 10.5 g/dL — ABNORMAL LOW (ref 13.0–17.0)
Potassium: 3.4 mmol/L — ABNORMAL LOW (ref 3.5–5.1)
Sodium: 140 mmol/L (ref 135–145)
TCO2: 20 mmol/L — ABNORMAL LOW (ref 22–32)

## 2021-01-03 LAB — COMPREHENSIVE METABOLIC PANEL
ALT: 65 U/L — ABNORMAL HIGH (ref 0–44)
AST: 73 U/L — ABNORMAL HIGH (ref 15–41)
Albumin: 2.7 g/dL — ABNORMAL LOW (ref 3.5–5.0)
Alkaline Phosphatase: 27 U/L — ABNORMAL LOW (ref 38–126)
Anion gap: 9 (ref 5–15)
BUN: 19 mg/dL (ref 8–23)
CO2: 17 mmol/L — ABNORMAL LOW (ref 22–32)
Calcium: 6.6 mg/dL — ABNORMAL LOW (ref 8.9–10.3)
Chloride: 109 mmol/L (ref 98–111)
Creatinine, Ser: 1.17 mg/dL (ref 0.61–1.24)
GFR, Estimated: >60 mL/min (ref >60)
Glucose, Bld: 347 mg/dL — ABNORMAL HIGH (ref 70–99)
Potassium: 3.3 mmol/L — ABNORMAL LOW (ref 3.5–5.1)
Sodium: 135 mmol/L (ref 135–145)
Total Bilirubin: 0.9 mg/dL (ref 0.3–1.2)
Total Protein: 4.6 g/dL — ABNORMAL LOW (ref 6.5–8.1)

## 2021-01-03 LAB — LACTATE DEHYDROGENASE
LDH: 297 U/L — ABNORMAL HIGH (ref 98–192)
LDH: 370 U/L — ABNORMAL HIGH (ref 98–192)

## 2021-01-03 LAB — COOXEMETRY PANEL
Carboxyhemoglobin: 0.4 % — ABNORMAL LOW (ref 0.5–1.5)
Methemoglobin: 0.8 % (ref 0.0–1.5)
O2 Saturation: 61.4 %
Total hemoglobin: 14.9 g/dL (ref 12.0–16.0)

## 2021-01-03 LAB — PROTIME-INR
INR: 1.5 — ABNORMAL HIGH (ref 0.8–1.2)
Prothrombin Time: 17.8 seconds — ABNORMAL HIGH (ref 11.4–15.2)

## 2021-01-03 LAB — POCT ACTIVATED CLOTTING TIME
Activated Clotting Time: 237 seconds
Activated Clotting Time: 273 seconds
Activated Clotting Time: 595 seconds
Activated Clotting Time: 351 seconds
Activated Clotting Time: 208 seconds

## 2021-01-03 LAB — RESP PANEL BY RT-PCR (FLU A&B, COVID) ARPGX2
Influenza A by PCR: NEGATIVE
Influenza B by PCR: NEGATIVE
SARS Coronavirus 2 by RT PCR: NEGATIVE

## 2021-01-03 LAB — HEPARIN LEVEL (UNFRACTIONATED): Heparin Unfractionated: <0.1 IU/mL — ABNORMAL LOW (ref 0.30–0.70)

## 2021-01-03 LAB — URINALYSIS, ROUTINE W REFLEX MICROSCOPIC
Bilirubin Urine: NEGATIVE
Glucose, UA: >=500 mg/dL — AB
Hgb urine dipstick: NEGATIVE
Ketones, ur: 5 mg/dL — AB
Leukocytes,Ua: NEGATIVE
Nitrite: NEGATIVE
Protein, ur: NEGATIVE mg/dL
Specific Gravity, Urine: 1.032 — ABNORMAL HIGH (ref 1.005–1.030)
pH: 5 (ref 5.0–8.0)

## 2021-01-03 LAB — ABO/RH: ABO/RH(D): AB POS

## 2021-01-03 LAB — LACTIC ACID, PLASMA
Lactic Acid, Venous: 6 mmol/L (ref 0.5–1.9)
Lactic Acid, Venous: 9.2 mmol/L (ref 0.5–1.9)
Lactic Acid, Venous: 9.7 mmol/L (ref 0.5–1.9)

## 2021-01-03 LAB — PREPARE RBC (CROSSMATCH)

## 2021-01-03 LAB — HEMOGLOBIN AND HEMATOCRIT, BLOOD
HCT: 40.7 % (ref 39.0–52.0)
Hemoglobin: 14.1 g/dL (ref 13.0–17.0)

## 2021-01-03 LAB — KETONES, URINE: Ketones, ur: 5 mg/dL — AB

## 2021-01-03 LAB — MAGNESIUM
Magnesium: 1.5 mg/dL — ABNORMAL LOW (ref 1.7–2.4)
Magnesium: 2.5 mg/dL — ABNORMAL HIGH (ref 1.7–2.4)

## 2021-01-03 LAB — GLUCOSE, CAPILLARY
Glucose-Capillary: 254 mg/dL — ABNORMAL HIGH (ref 70–99)
Glucose-Capillary: 392 mg/dL — ABNORMAL HIGH (ref 70–99)
Glucose-Capillary: 330 mg/dL — ABNORMAL HIGH (ref 70–99)

## 2021-01-03 LAB — PROCALCITONIN: Procalcitonin: 0.18 ng/mL

## 2021-01-03 LAB — MRSA PCR SCREENING: MRSA by PCR: NEGATIVE

## 2021-01-03 LAB — FIBRINOGEN: Fibrinogen: 214 mg/dL (ref 210–475)

## 2021-01-03 LAB — BETA-HYDROXYBUTYRIC ACID: Beta-Hydroxybutyric Acid: 0.08 mmol/L (ref 0.05–0.27)

## 2021-01-03 SURGERY — LEFT HEART CATH AND CORONARY ANGIOGRAPHY
Anesthesia: LOCAL

## 2021-01-03 MED ORDER — SODIUM CHLORIDE 0.9% IV SOLUTION
INTRAVENOUS | Status: DC | PRN
  Administered 2021-01-05: 250 mL via INTRA_ARTERIAL
  Administered 2021-01-10: 1000 mL via INTRA_ARTERIAL

## 2021-01-03 MED ORDER — AMIODARONE HCL 150 MG/3ML IV SOLN
INTRAVENOUS | Status: AC
  Filled 2021-01-03: qty 3

## 2021-01-03 MED ORDER — HEPARIN (PORCINE) IN NACL 1000-0.9 UT/500ML-% IV SOLN
INTRAVENOUS | Status: DC | PRN
  Administered 2021-01-03 (×4): 500 mL

## 2021-01-03 MED ORDER — ONDANSETRON HCL 4 MG/2ML IJ SOLN: 4.0000 mg | Freq: Four times a day (QID) | INTRAMUSCULAR | Status: DC | PRN

## 2021-01-03 MED ORDER — AMIODARONE HCL IN DEXTROSE 360-4.14 MG/200ML-% IV SOLN
INTRAVENOUS | Status: AC
  Filled 2021-01-03: qty 200

## 2021-01-03 MED ORDER — LABETALOL HCL 5 MG/ML IV SOLN: 10.0000 mg | INTRAVENOUS | Status: DC | PRN

## 2021-01-03 MED ORDER — MIDAZOLAM BOLUS VIA INFUSION
INTRAVENOUS | Status: DC | PRN
  Administered 2021-01-03: 2 mg via INTRAVENOUS
  Administered 2021-01-03 (×4): 4 mg via INTRAVENOUS

## 2021-01-03 MED ORDER — MIDAZOLAM 50MG/50ML (1MG/ML) PREMIX INFUSION
0.5000 mg/h | INTRAVENOUS | Status: DC
Start: 2021-01-03 — End: 2021-01-10
  Administered 2021-01-03: 4 mg/h via INTRAVENOUS
  Filled 2021-01-03: qty 50

## 2021-01-03 MED ORDER — AMIODARONE HCL IN DEXTROSE 360-4.14 MG/200ML-% IV SOLN
30.0000 mg/h | INTRAVENOUS | Status: DC
  Administered 2021-01-04 – 2021-01-05 (×2): 30 mg/h via INTRAVENOUS
  Filled 2021-01-03 (×3): qty 200

## 2021-01-03 MED ORDER — EPINEPHRINE 1 MG/10ML IJ SOSY
PREFILLED_SYRINGE | INTRAMUSCULAR | Status: AC
  Filled 2021-01-03: qty 10

## 2021-01-03 MED ORDER — SODIUM CHLORIDE 0.9 % IV SOLN: INTRAVENOUS | Status: DC | PRN

## 2021-01-03 MED ORDER — NOREPINEPHRINE BITARTRATE 1 MG/ML IV SOLN
INTRAVENOUS | Status: AC | PRN
  Administered 2021-01-03: 40 ug/min via INTRAVENOUS
  Administered 2021-01-03: 20 ug/min via INTRAVENOUS

## 2021-01-03 MED ORDER — POTASSIUM CHLORIDE 20 MEQ PO PACK
40.0000 meq | PACK | ORAL | Status: DC
  Filled 2021-01-03: qty 2

## 2021-01-03 MED ORDER — SODIUM CHLORIDE 0.9% IV SOLUTION: Freq: Once | INTRAVENOUS | Status: AC

## 2021-01-03 MED ORDER — SODIUM CHLORIDE 0.9 % IV BOLUS
1000.0000 mL | Freq: Once | INTRAVENOUS | Status: AC
  Administered 2021-01-03: 1000 mL via INTRAVENOUS

## 2021-01-03 MED ORDER — SODIUM CHLORIDE 0.9 % IV SOLN
250.0000 mL | INTRAVENOUS | Status: DC | PRN
  Administered 2021-01-05 – 2021-01-15 (×3): 250 mL via INTRAVENOUS

## 2021-01-03 MED ORDER — VASOPRESSIN 20 UNITS/100 ML INFUSION FOR SHOCK
0.0000 [IU]/min | INTRAVENOUS | Status: DC
  Administered 2021-01-03 – 2021-01-05 (×5): 0.03 [IU]/min via INTRAVENOUS
  Filled 2021-01-03 (×5): qty 100

## 2021-01-03 MED ORDER — POTASSIUM CHLORIDE 20 MEQ PO PACK
40.0000 meq | PACK | ORAL | Status: DC
Start: 2021-01-04 — End: 2021-01-04
  Administered 2021-01-03 – 2021-01-04 (×3): 40 meq
  Filled 2021-01-03 (×2): qty 2

## 2021-01-03 MED ORDER — CANGRELOR BOLUS VIA INFUSION
INTRAVENOUS | Status: DC | PRN
  Administered 2021-01-03: 2610 ug via INTRAVENOUS

## 2021-01-03 MED ORDER — MIDAZOLAM HCL 2 MG/2ML IJ SOLN
INTRAMUSCULAR | Status: AC
  Filled 2021-01-03: qty 2

## 2021-01-03 MED ORDER — FENTANYL CITRATE (PF) 100 MCG/2ML IJ SOLN
INTRAMUSCULAR | Status: DC | PRN
  Administered 2021-01-03: 50 ug via INTRAVENOUS

## 2021-01-03 MED ORDER — AMIODARONE HCL IN DEXTROSE 360-4.14 MG/200ML-% IV SOLN
60.0000 mg/h | INTRAVENOUS | Status: AC
  Administered 2021-01-04: 60 mg/h via INTRAVENOUS
  Filled 2021-01-03: qty 200

## 2021-01-03 MED ORDER — ALBUMIN HUMAN 5 % IV SOLN
INTRAVENOUS | Status: AC
  Administered 2021-01-03: 12.5 g via INTRAVENOUS
  Filled 2021-01-03: qty 250

## 2021-01-03 MED ORDER — TICAGRELOR 90 MG PO TABS
90.0000 mg | ORAL_TABLET | Freq: Two times a day (BID) | ORAL | Status: DC
  Administered 2021-01-03 – 2021-01-23 (×40): 90 mg
  Filled 2021-01-03 (×43): qty 1

## 2021-01-03 MED ORDER — SODIUM BICARBONATE 8.4 % IV SOLN: 25.0000 meq | INTRAVENOUS | Status: AC

## 2021-01-03 MED ORDER — VANCOMYCIN HCL 750 MG/150ML IV SOLN
750.0000 mg | Freq: Two times a day (BID) | INTRAVENOUS | Status: DC
  Administered 2021-01-04 – 2021-01-05 (×4): 750 mg via INTRAVENOUS
  Filled 2021-01-03 (×5): qty 150

## 2021-01-03 MED ORDER — MILRINONE LACTATE IN DEXTROSE 20-5 MG/100ML-% IV SOLN
0.2500 ug/kg/min | INTRAVENOUS | Status: DC
  Administered 2021-01-03: 0.25 ug/kg/min via INTRAVENOUS
  Filled 2021-01-03: qty 100

## 2021-01-03 MED ORDER — SODIUM BICARBONATE 8.4 % IV SOLN
INTRAVENOUS | Status: AC
  Administered 2021-01-03: 25 meq via INTRAVENOUS
  Filled 2021-01-03: qty 50

## 2021-01-03 MED ORDER — EPINEPHRINE 1 MG/10ML IJ SOSY
PREFILLED_SYRINGE | INTRAMUSCULAR | Status: AC
  Filled 2021-01-03: qty 40

## 2021-01-03 MED ORDER — PROPOFOL BOLUS VIA INFUSION
20.0000 mg | Freq: Once | INTRAVENOUS | Status: AC
  Administered 2021-01-03: 20 mg via INTRAVENOUS
  Filled 2021-01-03: qty 20

## 2021-01-03 MED ORDER — INSULIN ASPART 100 UNIT/ML ~~LOC~~ SOLN
3.0000 [IU] | SUBCUTANEOUS | Status: DC
  Administered 2021-01-03: 9 [IU] via SUBCUTANEOUS

## 2021-01-03 MED ORDER — ASPIRIN 81 MG PO CHEW: 81.0000 mg | CHEWABLE_TABLET | Freq: Every day | ORAL | Status: DC

## 2021-01-03 MED ORDER — SODIUM BICARBONATE 8.4 % IV SOLN
INTRAVENOUS | Status: AC
  Filled 2021-01-03: qty 50

## 2021-01-03 MED ORDER — NOREPINEPHRINE 16 MG/250ML-% IV SOLN
0.0000 ug/min | INTRAVENOUS | Status: DC
  Administered 2021-01-03: 60 ug/min via INTRAVENOUS
  Administered 2021-01-03: 45 ug/min via INTRAVENOUS
  Administered 2021-01-03: 40 ug/min via INTRAVENOUS
  Administered 2021-01-04: 18 ug/min via INTRAVENOUS
  Administered 2021-01-05: 6 ug/min via INTRAVENOUS
  Filled 2021-01-03 (×7): qty 250

## 2021-01-03 MED ORDER — ACETAMINOPHEN 325 MG PO TABS
650.0000 mg | ORAL_TABLET | ORAL | Status: DC | PRN
  Filled 2021-01-03: qty 2

## 2021-01-03 MED ORDER — SODIUM BICARBONATE 8.4 % IV SOLN
INTRAVENOUS | Status: DC
  Filled 2021-01-03 (×5): qty 1000

## 2021-01-03 MED ORDER — PANTOPRAZOLE SODIUM 40 MG IV SOLR
40.0000 mg | Freq: Every day | INTRAVENOUS | Status: DC
  Administered 2021-01-03 – 2021-01-04 (×2): 40 mg via INTRAVENOUS
  Filled 2021-01-03 (×2): qty 40

## 2021-01-03 MED ORDER — MIDAZOLAM 50MG/50ML (1MG/ML) PREMIX INFUSION
INTRAVENOUS | Status: AC
  Filled 2021-01-03: qty 50

## 2021-01-03 MED ORDER — SODIUM CHLORIDE 0.9 % IV SOLN
INTRAVENOUS | Status: DC | PRN
  Administered 2021-01-03: 4 ug/kg/min via INTRAVENOUS

## 2021-01-03 MED ORDER — HEPARIN SODIUM (PORCINE) 1000 UNIT/ML IJ SOLN
INTRAMUSCULAR | Status: DC | PRN
  Administered 2021-01-03: 3000 [IU] via INTRAVENOUS
  Administered 2021-01-03: 4000 [IU] via INTRAVENOUS
  Administered 2021-01-03: 10000 [IU] via INTRAVENOUS

## 2021-01-03 MED ORDER — SODIUM BICARBONATE 8.4 % IV SOLN
INTRAVENOUS | Status: AC
  Administered 2021-01-03: 100 meq via INTRAVENOUS
  Filled 2021-01-03: qty 100

## 2021-01-03 MED ORDER — HYDRALAZINE HCL 20 MG/ML IJ SOLN: 10.0000 mg | INTRAMUSCULAR | Status: DC | PRN

## 2021-01-03 MED ORDER — ORAL CARE MOUTH RINSE
15.0000 mL | OROMUCOSAL | Status: DC
  Administered 2021-01-03 – 2021-01-05 (×19): 15 mL via OROMUCOSAL

## 2021-01-03 MED ORDER — DEXTROSE 50 % IV SOLN: 0.0000 mL | INTRAVENOUS | Status: DC | PRN

## 2021-01-03 MED ORDER — SODIUM BICARBONATE 8.4 % IV SOLN: 100.0000 meq | Freq: Once | INTRAVENOUS | Status: AC

## 2021-01-03 MED ORDER — SODIUM CHLORIDE 0.9% FLUSH
3.0000 mL | INTRAVENOUS | Status: DC | PRN
  Administered 2021-01-06 – 2021-01-12 (×2): 3 mL via INTRAVENOUS

## 2021-01-03 MED ORDER — NOREPINEPHRINE 4 MG/250ML-% IV SOLN
INTRAVENOUS | Status: AC
  Filled 2021-01-03: qty 250

## 2021-01-03 MED ORDER — CALCIUM GLUCONATE-NACL 1-0.675 GM/50ML-% IV SOLN
1.0000 g | Freq: Once | INTRAVENOUS | Status: AC
  Administered 2021-01-03: 1000 mg via INTRAVENOUS
  Filled 2021-01-03: qty 50

## 2021-01-03 MED ORDER — ALBUMIN HUMAN 5 % IV SOLN: 12.5000 g | Freq: Once | INTRAVENOUS | Status: AC

## 2021-01-03 MED ORDER — EPINEPHRINE HCL 5 MG/250ML IV SOLN IN NS
0.5000 ug/min | INTRAVENOUS | Status: AC
  Administered 2021-01-03: 5 ug/min via INTRAVENOUS
  Administered 2021-01-03: 20 ug/min via INTRAVENOUS
  Administered 2021-01-03: 16 ug/min via INTRAVENOUS
  Administered 2021-01-03: 2 ug/min via INTRAVENOUS
  Administered 2021-01-04: 6 ug/min via INTRAVENOUS
  Administered 2021-01-04: 8 ug/min via INTRAVENOUS
  Administered 2021-01-04: 11 ug/min via INTRAVENOUS
  Administered 2021-01-05: 8 ug/min via INTRAVENOUS
  Filled 2021-01-03 (×7): qty 250

## 2021-01-03 MED ORDER — SODIUM CHLORIDE 0.9 % IV SOLN
INTRAVENOUS | Status: AC | PRN
  Administered 2021-01-03: 100 mL/h via INTRAVENOUS
  Administered 2021-01-03: 1000 mL via INTRAVENOUS
  Administered 2021-01-03: 100 mL/h via INTRAVENOUS

## 2021-01-03 MED ORDER — SODIUM BICARBONATE 8.4 % IV SOLN
INTRAVENOUS | Status: DC | PRN
  Administered 2021-01-03 (×2): 25 meq via INTRAVENOUS

## 2021-01-03 MED ORDER — VANCOMYCIN HCL 1250 MG/250ML IV SOLN
1250.0000 mg | Freq: Once | INTRAVENOUS | Status: AC
  Administered 2021-01-03: 1250 mg via INTRAVENOUS
  Filled 2021-01-03: qty 250

## 2021-01-03 MED ORDER — SODIUM CHLORIDE 0.9% FLUSH
10.0000 mL | Freq: Two times a day (BID) | INTRAVENOUS | Status: DC
  Administered 2021-01-03 – 2021-01-06 (×4): 10 mL
  Administered 2021-01-06: 20 mL
  Administered 2021-01-07 – 2021-01-15 (×13): 10 mL

## 2021-01-03 MED ORDER — HEPARIN (PORCINE) IN NACL 1000-0.9 UT/500ML-% IV SOLN
INTRAVENOUS | Status: AC
  Filled 2021-01-03: qty 1000

## 2021-01-03 MED ORDER — ROSUVASTATIN CALCIUM 5 MG PO TABS
10.0000 mg | ORAL_TABLET | Freq: Every day | ORAL | Status: DC
  Administered 2021-01-04 – 2021-01-23 (×20): 10 mg
  Filled 2021-01-03 (×22): qty 2

## 2021-01-03 MED ORDER — SODIUM CHLORIDE 0.9% FLUSH: 10.0000 mL | INTRAVENOUS | Status: DC | PRN

## 2021-01-03 MED ORDER — NOREPINEPHRINE BITARTRATE 1 MG/ML IV SOLN
INTRAVENOUS | Status: AC | PRN
  Administered 2021-01-03: 40 ug/min via INTRAVENOUS

## 2021-01-03 MED ORDER — AMIODARONE HCL IN DEXTROSE 360-4.14 MG/200ML-% IV SOLN
INTRAVENOUS | Status: AC | PRN
  Administered 2021-01-03: 60 mg/h via INTRAVENOUS

## 2021-01-03 MED ORDER — INSULIN REGULAR(HUMAN) IN NACL 100-0.9 UT/100ML-% IV SOLN
INTRAVENOUS | Status: DC
  Administered 2021-01-03: 5.5 [IU]/h via INTRAVENOUS
  Administered 2021-01-04: 21 [IU]/h via INTRAVENOUS
  Administered 2021-01-04: 3.6 [IU]/h via INTRAVENOUS
  Filled 2021-01-03 (×3): qty 100

## 2021-01-03 MED ORDER — SODIUM BICARBONATE 8.4 % IV SOLN
INTRAVENOUS | Status: DC | PRN
  Administered 2021-01-03: 25 meq via INTRAVENOUS

## 2021-01-03 MED ORDER — MAGNESIUM SULFATE 4 GM/100ML IV SOLN
4.0000 g | Freq: Once | INTRAVENOUS | Status: AC
  Administered 2021-01-03: 4 g via INTRAVENOUS
  Filled 2021-01-03: qty 100

## 2021-01-03 MED ORDER — IOHEXOL 350 MG/ML SOLN
INTRAVENOUS | Status: AC
  Filled 2021-01-03: qty 1

## 2021-01-03 MED ORDER — AMIODARONE HCL 150 MG/3ML IV SOLN
INTRAVENOUS | Status: DC | PRN
  Administered 2021-01-03: 300 mg via INTRAVENOUS

## 2021-01-03 MED ORDER — POTASSIUM CHLORIDE 10 MEQ/100ML IV SOLN
10.0000 meq | INTRAVENOUS | Status: AC
Start: 2021-01-03 — End: 2021-01-04
  Administered 2021-01-03 – 2021-01-04 (×4): 10 meq via INTRAVENOUS
  Filled 2021-01-03: qty 100

## 2021-01-03 MED ORDER — PROPOFOL 1000 MG/100ML IV EMUL
5.0000 ug/kg/min | INTRAVENOUS | Status: DC
  Administered 2021-01-03 (×3): 40 ug/kg/min via INTRAVENOUS
  Administered 2021-01-03: 50 ug/kg/min via INTRAVENOUS
  Administered 2021-01-04: 30 ug/kg/min via INTRAVENOUS
  Administered 2021-01-04: 35 ug/kg/min via INTRAVENOUS
  Administered 2021-01-04: 40 ug/kg/min via INTRAVENOUS
  Administered 2021-01-04 (×2): 35 ug/kg/min via INTRAVENOUS
  Administered 2021-01-05: 50 ug/kg/min via INTRAVENOUS
  Administered 2021-01-05: 65 ug/kg/min via INTRAVENOUS
  Administered 2021-01-05: 35 ug/kg/min via INTRAVENOUS
  Administered 2021-01-05: 50 ug/kg/min via INTRAVENOUS
  Administered 2021-01-05: 40 ug/kg/min via INTRAVENOUS
  Administered 2021-01-05: 35 ug/kg/min via INTRAVENOUS
  Administered 2021-01-06: 65 ug/kg/min via INTRAVENOUS
  Administered 2021-01-06: 60 ug/kg/min via INTRAVENOUS
  Administered 2021-01-06 (×2): 70 ug/kg/min via INTRAVENOUS
  Administered 2021-01-06 – 2021-01-07 (×4): 60 ug/kg/min via INTRAVENOUS
  Administered 2021-01-07: 55 ug/kg/min via INTRAVENOUS
  Administered 2021-01-07 (×2): 60 ug/kg/min via INTRAVENOUS
  Administered 2021-01-08: 20 ug/kg/min via INTRAVENOUS
  Administered 2021-01-09 (×2): 15 ug/kg/min via INTRAVENOUS
  Administered 2021-01-10: 18 ug/kg/min via INTRAVENOUS
  Filled 2021-01-03 (×11): qty 100
  Filled 2021-01-03: qty 200
  Filled 2021-01-03 (×21): qty 100

## 2021-01-03 MED ORDER — POTASSIUM CHLORIDE 10 MEQ/100ML IV SOLN: 10.0000 meq | INTRAVENOUS | Status: DC

## 2021-01-03 MED ORDER — POTASSIUM CHLORIDE 20 MEQ PO PACK
40.0000 meq | PACK | Freq: Once | ORAL | Status: AC
  Administered 2021-01-03: 40 meq
  Filled 2021-01-03: qty 2

## 2021-01-03 MED ORDER — FENTANYL 2500MCG IN NS 250ML (10MCG/ML) PREMIX INFUSION
INTRAVENOUS | Status: AC
  Filled 2021-01-03: qty 250

## 2021-01-03 MED ORDER — VANCOMYCIN HCL 750 MG/150ML IV SOLN
750.0000 mg | Freq: Two times a day (BID) | INTRAVENOUS | Status: DC
  Filled 2021-01-03: qty 150

## 2021-01-03 MED ORDER — ASPIRIN 81 MG PO CHEW
81.0000 mg | CHEWABLE_TABLET | Freq: Every day | ORAL | Status: DC
  Administered 2021-01-03 – 2021-01-23 (×21): 81 mg
  Filled 2021-01-03 (×23): qty 1

## 2021-01-03 MED ORDER — LIDOCAINE HCL (PF) 1 % IJ SOLN
INTRAMUSCULAR | Status: DC | PRN
  Administered 2021-01-03: 15 mL
  Administered 2021-01-03: 10 mL
  Administered 2021-01-03: 5 mL

## 2021-01-03 MED ORDER — SUCCINYLCHOLINE CHLORIDE 20 MG/ML IJ SOLN
INTRAMUSCULAR | Status: DC | PRN
  Administered 2021-01-03: 120 mg via INTRAVENOUS

## 2021-01-03 MED ORDER — NOREPINEPHRINE 4 MG/250ML-% IV SOLN
0.0000 ug/min | INTRAVENOUS | Status: DC
  Filled 2021-01-03: qty 250

## 2021-01-03 MED ORDER — CANGRELOR TETRASODIUM 50 MG IV SOLR
INTRAVENOUS | Status: AC
  Filled 2021-01-03: qty 50

## 2021-01-03 MED ORDER — FENTANYL CITRATE (PF) 100 MCG/2ML IJ SOLN
INTRAMUSCULAR | Status: AC
  Filled 2021-01-03: qty 2

## 2021-01-03 MED ORDER — EPINEPHRINE 1 MG/10ML IJ SOSY
PREFILLED_SYRINGE | INTRAMUSCULAR | Status: DC | PRN
  Administered 2021-01-03: 1 via INTRAVENOUS
  Administered 2021-01-03: 0.5 mg via INTRAVENOUS
  Administered 2021-01-03 (×2): 1 mg via INTRAVENOUS
  Administered 2021-01-03: 1 via INTRAVENOUS
  Administered 2021-01-03: 1 mg via INTRAVENOUS
  Administered 2021-01-03: 0.5 mg via INTRAVENOUS

## 2021-01-03 MED ORDER — SODIUM BICARBONATE 8.4 % IV SOLN
INTRAVENOUS | Status: AC
  Filled 2021-01-03: qty 100

## 2021-01-03 MED ORDER — CHLORHEXIDINE GLUCONATE CLOTH 2 % EX PADS
6.0000 | MEDICATED_PAD | Freq: Every day | CUTANEOUS | Status: DC
  Administered 2021-01-03 – 2021-01-30 (×27): 6 via TOPICAL

## 2021-01-03 MED ORDER — SODIUM CHLORIDE 0.9% FLUSH
3.0000 mL | Freq: Two times a day (BID) | INTRAVENOUS | Status: DC
  Administered 2021-01-03 – 2021-01-15 (×19): 3 mL via INTRAVENOUS

## 2021-01-03 MED ORDER — SODIUM CHLORIDE 0.9 % IV SOLN
2.0000 g | Freq: Two times a day (BID) | INTRAVENOUS | Status: DC
  Administered 2021-01-04: 2 g via INTRAVENOUS
  Filled 2021-01-03: qty 2

## 2021-01-03 MED ORDER — POTASSIUM CHLORIDE 20 MEQ PO PACK: 40.0000 meq | PACK | Freq: Once | ORAL | Status: DC

## 2021-01-03 MED ORDER — TICAGRELOR 90 MG PO TABS
ORAL_TABLET | ORAL | Status: DC | PRN
  Administered 2021-01-03: 180 mg via ORAL

## 2021-01-03 MED ORDER — IOHEXOL 350 MG/ML SOLN
INTRAVENOUS | Status: DC | PRN
  Administered 2021-01-03: 100 mL

## 2021-01-03 MED ORDER — SODIUM BICARBONATE 8.4 % IV SOLN
50.0000 meq | Freq: Once | INTRAVENOUS | Status: AC
  Administered 2021-01-03: 50 meq via INTRAVENOUS

## 2021-01-03 MED ORDER — POTASSIUM CHLORIDE 20 MEQ PO PACK
40.0000 meq | PACK | Freq: Four times a day (QID) | ORAL | Status: AC
  Administered 2021-01-03 (×2): 40 meq
  Filled 2021-01-03 (×2): qty 2

## 2021-01-03 MED ORDER — FENTANYL 2500MCG IN NS 250ML (10MCG/ML) PREMIX INFUSION
0.0000 ug/h | INTRAVENOUS | Status: DC
Start: 2021-01-03 — End: 2021-01-08
  Administered 2021-01-03 – 2021-01-05 (×3): 100 ug/h via INTRAVENOUS
  Administered 2021-01-06 (×2): 200 ug/h via INTRAVENOUS
  Administered 2021-01-07: 150 ug/h via INTRAVENOUS
  Administered 2021-01-07: 100 ug/h via INTRAVENOUS
  Filled 2021-01-03 (×7): qty 250

## 2021-01-03 MED ORDER — TICAGRELOR 90 MG PO TABS
ORAL_TABLET | ORAL | Status: AC
  Filled 2021-01-03: qty 2

## 2021-01-03 MED ORDER — FENTANYL BOLUS VIA INFUSION
INTRAVENOUS | Status: DC | PRN
  Administered 2021-01-03 (×2): 50 ug via INTRAVENOUS

## 2021-01-03 MED ORDER — MIDAZOLAM HCL 2 MG/2ML IJ SOLN
INTRAMUSCULAR | Status: DC | PRN
  Administered 2021-01-03 (×2): 4 mg via INTRAVENOUS

## 2021-01-03 MED ORDER — SODIUM CHLORIDE 0.9 % IV SOLN
2.0000 g | Freq: Three times a day (TID) | INTRAVENOUS | Status: DC
  Administered 2021-01-03 (×2): 2 g via INTRAVENOUS
  Filled 2021-01-03 (×2): qty 2

## 2021-01-03 MED ORDER — CHLORHEXIDINE GLUCONATE 0.12% ORAL RINSE (MEDLINE KIT)
15.0000 mL | Freq: Two times a day (BID) | OROMUCOSAL | Status: DC
  Administered 2021-01-03 – 2021-01-05 (×5): 15 mL via OROMUCOSAL

## 2021-01-03 MED ORDER — LIDOCAINE HCL (PF) 1 % IJ SOLN
INTRAMUSCULAR | Status: AC
  Filled 2021-01-03: qty 30

## 2021-01-03 MED ORDER — AMIODARONE HCL IN DEXTROSE 360-4.14 MG/200ML-% IV SOLN
60.0000 mg/h | INTRAVENOUS | Status: AC
  Administered 2021-01-03 (×2): 60 mg/h via INTRAVENOUS
  Filled 2021-01-03 (×2): qty 200

## 2021-01-03 MED ORDER — INSULIN ASPART 100 UNIT/ML ~~LOC~~ SOLN
1.0000 [IU] | SUBCUTANEOUS | Status: DC
  Administered 2021-01-03: 3 [IU] via SUBCUTANEOUS
  Filled 2021-01-03: qty 0.03

## 2021-01-03 MED ORDER — HEPARIN SODIUM (PORCINE) 5000 UNIT/ML IJ SOLN: INTRAVENOUS | Status: DC

## 2021-01-03 MED ORDER — HEPARIN SODIUM (PORCINE) 5000 UNIT/ML IJ SOLN
25000.0000 [IU] | INTRAVENOUS | Status: DC
  Administered 2021-01-03 – 2021-01-07 (×6): 25000 [IU]
  Filled 2021-01-03 (×9): qty 5

## 2021-01-03 SURGICAL SUPPLY — 28 items
BALLN SAPPHIRE 2.5X15 (BALLOONS) ×2
BALLN SAPPHIRE ~~LOC~~ 3.75X15 (BALLOONS) ×2 IMPLANT
BALLOON SAPPHIRE 2.5X15 (BALLOONS) ×1 IMPLANT
CABLE ADAPT PACING TEMP 12FT (ADAPTER) ×2 IMPLANT
CATH INFINITI 5FR MULTPACK ANG (CATHETERS) ×2 IMPLANT
CATH S G BIP PACING (CATHETERS) ×2 IMPLANT
CATH SWAN GANZ VIP 7.5F (CATHETERS) ×2 IMPLANT
CATH VISTA GUIDE 6FR JR4 (CATHETERS) ×2 IMPLANT
GLIDESHEATH SLEND SS 6F .021 (SHEATH) ×2 IMPLANT
GUIDEWIRE INQWIRE 1.5J.035X260 (WIRE) ×1 IMPLANT
INQWIRE 1.5J .035X260CM (WIRE) ×2
KIT ENCORE 26 ADVANTAGE (KITS) ×4 IMPLANT
KIT ESSENTIALS PG (KITS) ×2 IMPLANT
KIT HEART LEFT (KITS) ×2 IMPLANT
KIT MICROPUNCTURE NIT STIFF (SHEATH) ×2 IMPLANT
MAT PREVALON FULL STRYKER (MISCELLANEOUS) ×2 IMPLANT
PACK CARDIAC CATHETERIZATION (CUSTOM PROCEDURE TRAY) ×2 IMPLANT
SET IMPELLA CP PUMP (CATHETERS) ×2 IMPLANT
SHEATH PINNACLE 6F 10CM (SHEATH) ×6 IMPLANT
SHEATH PINNACLE 8F 10CM (SHEATH) ×2 IMPLANT
SLEEVE REPOSITIONING LENGTH 30 (MISCELLANEOUS) ×4 IMPLANT
STENT RESOLUTE ONYX 3.5X34 (Permanent Stent) ×2 IMPLANT
TRANSDUCER W/STOPCOCK (MISCELLANEOUS) ×2 IMPLANT
TUBING CIL FLEX 10 FLL-RA (TUBING) ×2 IMPLANT
WIRE COUGAR XT STRL 190CM (WIRE) ×2 IMPLANT
WIRE EMERALD 3MM-J .035X150CM (WIRE) ×2 IMPLANT
WIRE HI TORQ STEELCORE18 300CM (WIRE) ×2 IMPLANT
WIRE MICROINTRODUCER 60CM (WIRE) ×4 IMPLANT

## 2021-01-03 NOTE — Progress Notes (Signed)
[facility] Progress Note Patient Name: [name] DOB: Apr 10, 1951 MRN: [id number]   Date of Service  01/03/2021  HPI/Events of Note  Patient needs Amiodarone infusion orders.  eICU Interventions  Amiodarone infusion ordered per protocol.        Kerry Kass Milfred Krammes 01/03/2021, 9:04 PM

## 2021-01-03 NOTE — Progress Notes (Signed)
  Echocardiogram 2D Echocardiogram limited  has been performed.  [name] M 01/03/2021, 1:51 PM

## 2021-01-03 NOTE — ED Provider Notes (Signed)
Patient was in coded by EMS and EKG transmitted.  I reviewed transmitted EKG and confirmed for activation of code STEMI.  EKG shows a sinus rhythm rate of 57 with inferior ST segment elevation MI.  Anterior and lateral leads grossly normal in appearance.  Patient arrived by EMS and was COVID swabbed and registered at the bridge.  I was not notified of the patient arrival.  He was taken directly to Cath Lab by instruction of STEMI physician to intake staff.  Patient was not seen by myself.   Charlesetta Shanks, MD 01/03/21 [phone]

## 2021-01-03 NOTE — Progress Notes (Signed)
Orthopedic Tech Progress Note Patient Details:  [name] 01/19/1951 [id number] Left Knee immobilizer with nurse Ortho Devices Type of Ortho Device: Knee Immobilizer Ortho Device/Splint Interventions: Ordered       Christalyn Goertz A [name] 01/03/2021, 2:03 PM

## 2021-01-03 NOTE — Progress Notes (Signed)
ANTICOAGULATION CONSULT NOTE - Initial Consult  Pharmacy Consult for heparin Indication: Impella   Allergies  Allergen Reactions  . Codeine Other (See Comments)    hallucinations  . Lisinopril Cough    Patient Measurements:     Vital Signs: Temp: 96.62 F (35.9 C) (04/14 1510) BP: 99/84 (04/14 1500)  Labs: Recent Labs    01/03/21 1107 01/03/21 1251 01/03/21 1446  HGB 11.0* 15.0 15.3  HCT 32.4* 43.8 45.0  PLT 215 165  --   LABPROT 17.8*  --   --   INR 1.5*  --   --   CREATININE 1.17  --   --     Estimated Creatinine Clearance: 66.4 mL/min (by C-G formula based on SCr of 1.17 mg/dL).   Medical History: Past Medical History:  Diagnosis Date  . Anxiety   . Arthritis   . Cancer ([place]) 2010   basal cell ca, head  . Cataract    bilateral  . Depression   . ED (erectile dysfunction)   . GERD (gastroesophageal reflux disease)   . Hyperlipidemia   . Hypertension   . Kidney stone 07-29-11   passed   . 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 Vitreous floater    left eye, sees Dr. Dawna [facility] at [facility]      Assessment: 57yom admitted for STEMI s/p VF arrest shock and impella + DES to RCA Groin bleeding after cangrelor used prior to OG being placed and ticagrelor given  cangrelor now off, PRBC x4 given and groin stable   Current purge flow 49ml/min and pressure 500s stable on D5W purge  Will check CBC later this evening to ensure stable then change to heparin purge this evening   Goal of Therapy:  Monitor platelets by anticoagulation protocol: Yes   Plan:  Draw CBC and heparin level at 6pm If CBC stable start low dose heparin purge  Draw daily heparin level and CBC   Bonnita Nasuti Pharm.D. CPP, BCPS Clinical Pharmacist [phone] 01/03/2021 3:52 PM

## 2021-01-03 NOTE — Anesthesia Procedure Notes (Signed)
Procedure Name: Intubation Date/Time: 01/03/2021 8:28 AM Performed by: Janace Litten, CRNA Pre-anesthesia Checklist: Patient identified, Emergency Drugs available, Suction available and Patient being monitored Patient Re-evaluated:Patient Re-evaluated prior to induction Oxygen Delivery Method: Ambu bag Preoxygenation: Pre-oxygenation with 100% oxygen Induction Type: IV induction Ventilation: Mask ventilation without difficulty Laryngoscope Size: Glidescope and 4 Grade View: Grade I Tube type: Oral Tube size: 7.5 mm Number of attempts: 1 Airway Equipment and Method: Video-laryngoscopy and Rigid stylet Placement Confirmation: ETT inserted through vocal cords under direct vision,  positive ETCO2 and breath sounds checked- equal and bilateral Secured at: 24 cm Tube secured with: Tape Dental Injury: Teeth and Oropharynx as per pre-operative assessment

## 2021-01-03 NOTE — Consult Note (Addendum)
NAME:  [name], MRN:  [id number], DOB:  September 05, 1951, LOS: 0 ADMISSION DATE:  01/03/2021, CONSULTATION DATE:  01/03/2021 REFERRING MD:  Dr. Haroldine Laws, CHIEF COMPLAINT:  STEMI/ cardiogenic shock  History of Present Illness:  HPI obtained from medical chart review as patient is currently intubated and sedated on mechanical ventilation.  70 year old male with prior history of hypertension, hyperlipidemia, and CAD presenting by EMS with sudden onset of chest pain found to have inferior STEMI with bradycardia and hypotension.  Taken emergently to Cath Lab on arrival.  A temporary pacer was placed with good capture, but then developed profound hypotension decompensating to cardiac arrest, requiring intubation by anesthesia, CPR, episodes of V. fib requiring defibrillation and amiodarone.  Found to have total proximal RCA occlusion and underwent PCI to the RCA with stent placement.  Procedure further complicated by cardiogenic shock requiring placement of Impella device complicated by bleeding secondary to cangrelor with development of bleeding and hematoma at puncture sites, Hgb 12.6-> 7.5,  requiring emergent transfusion of 4 units PRBC and stopping of cangrelor with stabilization of hemodynamics but remains on NE, epi, and vasopressin drips.  Patient returns to ICU intubated and sedated in cardiogenic shock with TVP and impella.  Unable to place aline in cath lab secondary to bleeding.  Labs pending.  PCCM consulted for vent management.   Pertinent  Medical History  HTN, HLD, CAD  Significant Hospital Events: Including procedures, antibiotic start and stop dates in addition to other pertinent events   . 4/14 admitted Cards with inferior STEMI-> bradycardia s/p TVP, cardiogenic shock and cardiac arrest s/p impella with PCI/stent to RCA, complicated by bleeding s/p 4 units PRBC thought 2/2 cangrelor, pending coags.  Has R IJ PA cath, TVP R groin, impella L groin  Interim History / Subjective:   Sedated on propofol 50 and fentanyl 100 mcg/hr Remains on NE 50, epi 5, vasopressin, amiodarone 60   Objective   SpO2 100 %.    Vent Mode: PRVC FiO2 (%):  [60 %-100 %] 60 % Set Rate:  [16 bmp-22 bmp] 22 bmp Vt Set:  [640 mL] 640 mL PEEP:  [5 cmH20] 5 cmH20 Plateau Pressure:  [18 cmH20-27 cmH20] 18 cmH20   Intake/Output Summary (Last 24 hours) at 01/03/2021 1249 Last data filed at 01/03/2021 4098 Gross per 24 hour  Intake 10 ml  Output --  Net 10 ml   There were no vitals filed for this visit.  Examination: General:  Critically ill older male sedated and intubated  HEENT: ETT/ OGT, pupils 3/=/ sluggish Neuro: sedated, unresponsive CV: paced, impella in left groin with large hematoma (resolving), right groin with TVP with saturated bulky dressing- femoral compression device in place, + dp bilateral pulses PULM:  MV supported breaths, clear/ diminished GI: soft, hypoBS, no foley  Extremities: cool/dry, no LE edema  Skin: pale, large hematoma (area marked) to left clavicle/ shoulder area   Labs/imaging that I havepersonally reviewed  (right click and "Reselect all SmartList Selections" daily)  4/14 LHC >>  Ost LAD to Prox LAD lesion is 50% stenosed.  Mid LAD lesion is 20% stenosed.  Prox RCA-1 lesion is 100% stenosed.  Prox RCA-2 lesion is 50% stenosed.  A stent was successfully placed.  Post intervention, there is a 0% residual stenosis.  Post intervention, there is a 0% residual stenosis.   Resolved Hospital Problem list    Assessment & Plan:   Inferior STEMI s/p PCI/ stent to main dominant RCA Cardiogenic/ Hemorrhagic shock  Vfib Cardiac  arrest  Bradycardia s/p TVP P:  Per HF/ cardiology  Continue NE, epi, vaso for MAP goal > 65 Impella per HF DAPT therapy with ASA/ Brillinta  amio per cards  Pending CBC, CMET, coags, lactate PA hemodynamics per HF Hold on aline placement for now given coagulapathy   Hemorrhagic shock  P:  - thought secondary to  cangrelor/ impella left groin site, since stopped, hematoma stable - s/p emergent 4 units PRBC - monitor for further bleeding or expanding hematomas  - CBC, coags, and fibrinogen pending   Acute hypoxic respiratory failure secondary to above P:  -Continue MV support, 4-8cc/kg IBW with goal Pplat <30 and DP<15  -VAP prevention protocol/ PPI -PAD protocol for sedation> propofol/ versed -wean FiO2 as able for SpO2 >92%  - CXR and ABG now, addendum: CXR reviewed, clear, ETT 5 above carina, will advance by 1 cm  HTN P:  - hold home meds in the setting of  shock   HLD - hold home crestor  Hyperglycemia  P:  - recheck, may need insulin gtt vs SSI   Leukocytosis  P:  - likely reactive, check PCT - started on vanc/ cefepime, if PCT neg, consider d/c - CXR pending (neg for acute process)  - check UA - hold on ppx abx for now, monitor clinically   Best practice (right click and "Reselect all SmartList Selections" daily)  Diet:  NPO Pain/Anxiety/Delirium protocol (if indicated): Yes (RASS goal -3,-4) VAP protocol (if indicated): Yes DVT prophylaxis: Subcutaneous Heparin GI prophylaxis: PPI Glucose control:  SSI Yes Central venous access:  Yes, and it is still needed Arterial line:  N/A Foley:  N/A- hold on foley insertion given coagulopathy, bladder scan q 4 for now Mobility:  bed rest  PT consulted: N/A Last date of multidisciplinary goals of care discussion [per primary ] Code Status:  full code Disposition: 2H ICU   Labs   CBC: No results for input(s): WBC, NEUTROABS, HGB, HCT, MCV, PLT in the last 168 hours.  Basic Metabolic Panel: Recent Labs  Lab 01/03/21 1107  NA 135  K 3.3*  CL 109  CO2 17*  GLUCOSE 347*  BUN 19  CREATININE 1.17  CALCIUM 6.6*   GFR: Estimated Creatinine Clearance: 66.4 mL/min (by C-G formula based on SCr of 1.17 mg/dL). No results for input(s): PROCALCITON, WBC, LATICACIDVEN in the last 168 hours.  Liver Function Tests: Recent Labs   Lab 01/03/21 1107  AST 73*  ALT 65*  ALKPHOS 27*  BILITOT 0.9  PROT 4.6*  ALBUMIN 2.7*   No results for input(s): LIPASE, AMYLASE in the last 168 hours. No results for input(s): AMMONIA in the last 168 hours.  ABG No results found for: PHART, PCO2ART, PO2ART, HCO3, TCO2, ACIDBASEDEF, O2SAT   Coagulation Profile: Recent Labs  Lab 01/03/21 1107  INR 1.5*    Cardiac Enzymes: No results for input(s): CKTOTAL, CKMB, CKMBINDEX, TROPONINI in the last 168 hours.  HbA1C: Hemoglobin A1C  Date/Time Value Ref Range Status  10/12/2019 04:07 PM 5.6 4.0 - 5.6 % Final   Hgb A1c MFr Bld  Date/Time Value Ref Range Status  05/24/2020 08:29 AM 5.5 <5.7 % of total Hgb Final    Comment:    For the purpose of screening for the presence of diabetes: . <5.7%       Consistent with the absence of diabetes 5.7-6.4%    Consistent with increased risk for diabetes             (prediabetes) >  or =6.5%  Consistent with diabetes . This assay result is consistent with a decreased risk of diabetes. . Currently, no consensus exists regarding use of hemoglobin A1c for diagnosis of diabetes in children. . According to American Diabetes Association (ADA) guidelines, hemoglobin A1c <7.0% represents optimal control in non-pregnant diabetic patients. Different metrics may apply to specific patient populations.  Standards of Medical Care in Diabetes(ADA). .     CBG: No results for input(s): GLUCAP in the last 168 hours.  Review of Systems:   Unable   Past Medical History:  He,  has a past medical history of Anxiety, Arthritis, Cancer ([place]) (2010), Cataract, Depression, ED (erectile dysfunction), GERD (gastroesophageal reflux disease), Hyperlipidemia, Hypertension, Kidney stone (07-29-11), Neuromuscular disorder ([place]), and Vitreous floater.   Surgical History:   Past Surgical History:  Procedure Laterality Date  . BASAL CELL CARCINOMA EXCISION     removed from scalp  . COLONOSCOPY   07/03/2017   per Dr. Havery Moros, sessile serrated polyp, repeat in 5 yrs   . HERNIA REPAIR       Social History:   reports that he has never smoked. He has never used smokeless tobacco. He reports that he does not drink alcohol and does not use drugs.   Family History:  His family history includes Alzheimer's disease in an other family member; Depression in an other family member; Diabetes in an other family member; Hyperlipidemia in his mother and another family member; Hypertension in his mother and another family member. There is no history of Colon cancer.   Allergies Allergies  Allergen Reactions  . Codeine Other (See Comments)    hallucinations  . Lisinopril Cough     Home Medications  Prior to Admission medications   Medication Sig Start Date End Date Taking? Authorizing Provider  acetaminophen (TYLENOL) 325 MG tablet Take 650 mg by mouth every 6 (six) hours as needed.    [provider]  amLODipine (NORVASC) 5 MG tablet TAKE 1 TABLET BY MOUTH EVERY DAY 07/04/20   [name], MD  cyclobenzaprine (FLEXERIL) 10 MG tablet TAKE 1 TABLET BY MOUTH AT BEDTIME 09/28/20   [name], MD  DULoxetine (CYMBALTA) 20 MG capsule TAKE 1 CAPSULE BY MOUTH EVERY DAY 02/06/20   [name], MD  finasteride ([name]) 5 MG tablet Take 1 tablet (5 mg total) by mouth daily. 02/01/14   [name], MD  FLUAD 0.5 ML SUSY TO BE ADMINISTERED BY PHARMACIST FOR IMMUNIZATION 05/21/18   [provider]  LORazepam (ATIVAN) 2 MG tablet Take 1 tablet (2 mg total) by mouth every 6 (six) hours as needed for anxiety. 10/03/20   [name], MD  losartan (COZAAR) 50 MG tablet TAKE 2 TABLETS BY MOUTH EVERY DAY 12/04/20   [name], MD  magic mouthwash SOLN Take 10 mLs by mouth 3 (three) times daily as needed for mouth pain. Patient not taking: Reported on 12/28/2020 08/22/20   Faustino Congress, NP  Melatonin 10 MG TABS Take by mouth as needed.    [provider]   omeprazole (PRILOSEC) 20 MG capsule TAKE 1 CAPSULE (20 MG TOTAL) BY MOUTH 2 (TWO) TIMES DAILY BEFORE A MEAL. 05/01/20   [name], MD  rosuvastatin (CRESTOR) 5 MG tablet TAKE 1 TABLET BY MOUTH 2-3 TIMES WEEKLY AS TOLERATED 09/06/20   [name], MD  sildenafil (REVATIO) 20 MG tablet TAKE 2 5 TABLETS BY MOUTH USE AS DIRECTED AS NEEDED 06/10/19   [provider]  tamsulosin (FLOMAX) 0.4 MG CAPS capsule Take 0.4 mg by mouth daily.    [provider]  triamcinolone cream (KENALOG) 0.1 % Apply 1 application topically 2 (two) times daily as needed.    [provider]  tadalafil (CIALIS) 20 MG tablet Take 20 mg by mouth daily as needed.    12/17/11  [provider]     Critical care time: 39 mins       Kennieth Rad, ACNP [facility] 01/03/2021, 1:15 PM See Amion for pager

## 2021-01-03 NOTE — H&P (Signed)
Advanced Heart Failure Team H&P Note   Primary Physician: [name], MD PCP-Cardiologist:  No primary care provider on file.  Reason for Consultation: Cardiogenic Shock  HPI:    [name] is seen today for evaluation of cardiogenic shock  at the request of Dr Ellyn Hack.   Mr Zimmers is a HTN, and CAD noted on CT.   Presented to ED via EMS with chest pain. ST elevation noted. Code STEMI called Taken urgently to the cath lab. Had VF arrest with shocks then went in [facility]. Had Impella placed and intubation.   Review of Systems: [y] = yes, [ ]  =  Intubated history obtained from the chart.   . General: Weight gain [ ] ; Weight loss [ ] ; Anorexia [ ] ; Fatigue [ ] ; Fever [ ] ; Chills [ ] ; Weakness [ ]   . Cardiac: Chest pain/pressure [ ] ; Resting SOB [ ] ; Exertional SOB [ ] ; Orthopnea [ ] ; Pedal Edema [ ] ; Palpitations [ ] ; Syncope [ ] ; Presyncope [ ] ; Paroxysmal nocturnal dyspnea[ ]   . Pulmonary: Cough [ ] ; Wheezing[ ] ; Hemoptysis[ ] ; Sputum [ ] ; Snoring [ ]   . GI: Vomiting[ ] ; Dysphagia[ ] ; Melena[ ] ; Hematochezia [ ] ; Heartburn[ ] ; Abdominal pain [ ] ; Constipation [ ] ; Diarrhea [ ] ; BRBPR [ ]   . GU: Hematuria[ ] ; Dysuria [ ] ; Nocturia[ ]   . Vascular: Pain in legs with walking [ ] ; Pain in feet with lying flat [ ] ; Non-healing sores [ ] ; Stroke [ ] ; TIA [ ] ; Slurred speech [ ] ;  . Neuro: Headaches[ ] ; Vertigo[ ] ; Seizures[ ] ; Paresthesias[ ] ;Blurred vision [ ] ; Diplopia [ ] ; Vision changes [ ]   . Ortho/Skin: Arthritis [ ] ; Joint pain [ ] ; Muscle pain [ ] ; Joint swelling [ ] ; Back Pain [ ] ; Rash [ ]   . Psych: Depression[ ] ; Anxiety[ ]   . Heme: Bleeding problems [ ] ; Clotting disorders [ ] ; Anemia [ ]   . Endocrine: Diabetes [ ] ; Thyroid dysfunction[ ]   Home Medications Prior to Admission medications   Medication Sig Start Date End Date Taking? Authorizing Provider  acetaminophen (TYLENOL) 325 MG tablet Take 650 mg by mouth every 6 (six) hours as needed.    [provider]   amLODipine (NORVASC) 5 MG tablet TAKE 1 TABLET BY MOUTH EVERY DAY 07/04/20   [name], MD  cyclobenzaprine (FLEXERIL) 10 MG tablet TAKE 1 TABLET BY MOUTH AT BEDTIME 09/28/20   [name], MD  DULoxetine (CYMBALTA) 20 MG capsule TAKE 1 CAPSULE BY MOUTH EVERY DAY 02/06/20   [name], MD  finasteride ([name]) 5 MG tablet Take 1 tablet (5 mg total) by mouth daily. 02/01/14   [name], MD  FLUAD 0.5 ML SUSY TO BE ADMINISTERED BY PHARMACIST FOR IMMUNIZATION 05/21/18   [provider]  LORazepam (ATIVAN) 2 MG tablet Take 1 tablet (2 mg total) by mouth every 6 (six) hours as needed for anxiety. 10/03/20   [name], MD  losartan (COZAAR) 50 MG tablet TAKE 2 TABLETS BY MOUTH EVERY DAY 12/04/20   [name], MD  magic mouthwash SOLN Take 10 mLs by mouth 3 (three) times daily as needed for mouth pain. Patient not taking: Reported on 12/28/2020 08/22/20   Faustino Congress, NP  Melatonin 10 MG TABS Take by mouth as needed.    [provider]  omeprazole (PRILOSEC) 20 MG capsule TAKE 1 CAPSULE (20 MG TOTAL) BY MOUTH 2 (TWO) TIMES DAILY BEFORE A MEAL. 05/01/20  [name], MD  rosuvastatin (CRESTOR) 5 MG tablet TAKE 1 TABLET BY MOUTH 2-3 TIMES WEEKLY AS TOLERATED 09/06/20   [name], MD  sildenafil (REVATIO) 20 MG tablet TAKE 2 5 TABLETS BY MOUTH USE AS DIRECTED AS NEEDED 06/10/19   [provider]  tamsulosin (FLOMAX) 0.4 MG CAPS capsule Take 0.4 mg by mouth daily.    [provider]  triamcinolone cream (KENALOG) 0.1 % Apply 1 application topically 2 (two) times daily as needed.    [provider]  tadalafil (CIALIS) 20 MG tablet Take 20 mg by mouth daily as needed.    12/17/11  [provider]    Past Medical History: Past Medical History:  Diagnosis Date  . Anxiety   . Arthritis   . Cancer ([place]) 2010   basal cell ca, head  . Cataract    bilateral  . Depression   . ED (erectile dysfunction)   . GERD  (gastroesophageal reflux disease)   . Hyperlipidemia   . Hypertension   . Kidney stone 07-29-11   passed   . 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 Vitreous floater    left eye, sees [facility]     Past Surgical History: Past Surgical History:  Procedure Laterality Date  . BASAL CELL CARCINOMA EXCISION     removed from scalp  . COLONOSCOPY  07/03/2017   per Dr. Havery Moros, sessile serrated polyp, repeat in 5 yrs   . HERNIA REPAIR      Family History: Family History  Problem Relation Age of Onset  . Hypertension Mother   . Hyperlipidemia Mother   . Depression Other   . Diabetes Other   . Hyperlipidemia Other   . Hypertension Other   . Alzheimer's disease Other   . Colon cancer Neg Hx     Social History: Social History   Socioeconomic History  . Marital status: Single    Spouse name: Not on file  . Number of children: Not on file  . Years of education: Not on file  . Highest education level: Not on file  Occupational History  . Occupation: retired    Comment: Pharmacist, hospital  Tobacco Use  . Smoking status: Never Smoker  . Smokeless tobacco: Never Used  Vaping Use  . Vaping Use: Never used  Substance and Sexual Activity  . Alcohol use: No    Alcohol/week: 0.0 standard drinks  . Drug use: No  . Sexual activity: Not on file  Other Topics Concern  . Not on file  Social History Narrative  . Not on file   Social Determinants of Health   Financial Resource Strain: Low Risk   . Difficulty of Paying Living Expenses: Not hard at all  Food Insecurity: No Food Insecurity  . Worried About Charity fundraiser in the Last Year: Never true  . Ran Out of Food in the Last Year: Never true  Transportation Needs: No Transportation Needs  . Lack of Transportation (Medical): No  . Lack of Transportation (Non-Medical): No  Physical Activity: Sufficiently Active  . Days of Exercise per Week: 7 days  . Minutes of Exercise per Session: 30 min   Stress: No Stress Concern Present  . Feeling of Stress : Not at all  Social Connections: Socially Isolated  . Frequency of Communication with Friends and Family: More than three times a week  . Frequency of Social Gatherings with Friends and Family: More than three times  a week  . Attends Religious Services: Never  . Active Member of Clubs or Organizations: No  . Attends Archivist Meetings: Never  . Marital Status: Never married    Allergies:  Allergies  Allergen Reactions  . Codeine Other (See Comments)    hallucinations  . Lisinopril Cough    Objective:    Vital Signs:   SpO2:  [97 %] 97 % (04/14 0808)    Weight change: There were no vitals filed for this visit.  Intake/Output:   Intake/Output Summary (Last 24 hours) at 01/03/2021 0944 Last data filed at 01/03/2021 0828 Gross per 24 hour  Intake 10 ml  Output --  Net 10 ml      Physical Exam    General:  Well appearing. No resp difficulty HEENT: normal Neck: supple. JVP . Carotids 2+ bilat; no bruits. No lymphadenopathy or thyromegaly appreciated. Cor: PMI nondisplaced. Regular rate & rhythm. No rubs, gallops or murmurs. Lungs: clear Abdomen: soft, nontender, nondistended. No hepatosplenomegaly. No bruits or masses. Good bowel sounds. Extremities: no cyanosis, clubbing, rash, edema Neuro: alert & orientedx3, cranial nerves grossly intact. moves all 4 extremities w/o difficulty. Affect pleasant   Telemetry   V paced 100   Labs   Basic Metabolic Panel: No results for input(s): NA, K, CL, CO2, GLUCOSE, BUN, CREATININE, CALCIUM, MG, PHOS in the last 168 hours.  Liver Function Tests: No results for input(s): AST, ALT, ALKPHOS, BILITOT, PROT, ALBUMIN in the last 168 hours. No results for input(s): LIPASE, AMYLASE in the last 168 hours. No results for input(s): AMMONIA in the last 168 hours.  CBC: No results for input(s): WBC, NEUTROABS, HGB, HCT, MCV, PLT in the last 168 hours.  Cardiac  Enzymes: No results for input(s): CKTOTAL, CKMB, CKMBINDEX, TROPONINI in the last 168 hours.  BNP: BNP (last 3 results) No results for input(s): BNP in the last 8760 hours.  ProBNP (last 3 results) No results for input(s): PROBNP in the last 8760 hours.   CBG: No results for input(s): GLUCAP in the last 168 hours.  Coagulation Studies: No results for input(s): LABPROT, INR in the last 72 hours.   Imaging    No results found.   Medications:     Current Medications:   Infusions: . amiodarone 60 mg/hr (01/03/21 0829)  . fentaNYL infusion INTRAVENOUS 100 mcg/hr (01/03/21 0941)  . midazolam 4 mg/hr (01/03/21 0942)  . norepinephrine (LEVOPHED) 4mg  / 258mL infusion 20 mcg/min (01/03/21 0933)        Assessment/Plan   1 CAD with Inferior STEMI 2. Cardiogenic Shock  3. VF Arrest 4. Acute Hypoxic Respiratory Failure \\4 . Hemorrhagic shock due to bleeding at impella site.    Length of Stay: 0  Darrick Grinder, NP  01/03/2021, 9:44 AM  Advanced Heart Failure Team Pager [phone] (M-F; 7a - 5p)  Please contact [facility] for night-coverage after hours (4p -7a ) and weekends on [email or web address]  Agree with above  70 y/o male with no known heart disease. Presented with Inferior STEMI.  Initial angiography showed sluggish flow in LAD and LCX. Developed VF arrest.  I was called.   Impella placed by Drs. Claiborne Billings and [place].   Angiography showed improved flow in left system with totally occluded prox RCA. Had several more episodes of VF.   Underwent PCI of RCA. Started on Cangrelor  Subsequently developed severe hemorrhagic shock due to bleeding at left groin Impella site. Hb 12.6 -> 7.5  Given several liters of IVF. 4 u RBC  via rapid infuser. On NE 50, Epi 5 and VP 0.03.   Manual pressure held on groin for over 2 hours. OG placed and Brillinta given. Cangrelor subsequently weaned off.   Stat echo done showed LVEF 50% RV mild HK. No effusion. Impella position ok.    General:  Sedated on vent pale HEENT: normal + ETT Neck: supple. RIJ swan Carotids 2+ bilat; no bruits. No lymphadenopathy or thryomegaly appreciated. Cor: PMI nondisplaced. Regular rate & rhythm. No rubs, gallops or murmurs. Lungs: clear Abdomen: soft, nontender, nondistended. No hepatosplenomegaly. No bruits or masses. Good bowel sounds. Extremities: no cyanosis, clubbing, rash. Cool Left femoral impella with large hematoma Neuro: sedated  He is critically ill with combination of cardiogenic and hemorrhagic shock. Hopefully bleeding will subside now that cangrelor off. Continue supportive care with pressors, blood products, vent and abx.   Total CCT time 3.5 hours.   Glori Bickers, MD  11:56 AM

## 2021-01-03 NOTE — Progress Notes (Addendum)
ANTICOAGULATION CONSULT NOTE - Initial Consult  Pharmacy Consult for heparin Indication: Impella   Allergies  Allergen Reactions  . Codeine Other (See Comments)    hallucinations  . Lisinopril Cough    Patient Measurements:     Vital Signs: Temp: 98.42 F (36.9 C) (04/14 1815) BP: 97/61 (04/14 1815) Pulse Rate: 100 (04/14 1815)  Labs: Recent Labs    01/03/21 1046 01/03/21 1105 01/03/21 1107 01/03/21 1251 01/03/21 1446 01/03/21 1809  HGB 10.5*   < > 11.0* 15.0 15.3 13.3  HCT 31.0*   < > 32.4* 43.8 45.0 39.1  PLT  --   --  215 165  --  150  LABPROT  --   --  17.8*  --   --   --   INR  --   --  1.5*  --   --   --   HEPARINUNFRC  --   --   --   --   --  <0.10*  CREATININE 0.90  --  1.17  --   --  1.58*   < > = values in this interval not displayed.    Estimated Creatinine Clearance: 49.2 mL/min (A) (by C-G formula based on SCr of 1.58 mg/dL (H)).   Medical History: Past Medical History:  Diagnosis Date  . Anxiety   . Arthritis   . Cancer ([place]) 2010   basal cell ca, head  . Cataract    bilateral  . Depression   . ED (erectile dysfunction)   . GERD (gastroesophageal reflux disease)   . Hyperlipidemia   . Hypertension   . Kidney stone 07-29-11   passed   . 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 Vitreous floater    left eye, sees [facility]. Dawn[facility] at [facility]      Assessment: 67yom admitted for STEMI s/p VF arrest shock and impella + DES to RCA. Groin bleeding after cangrelor used prior to OG being placed and ticagrelor given cangrelor now off, PRBC x4 given and groin stable.   Impella with D5W in purge - no issues per RN. Heparin level undetectable as expected from heparin received in cath and no heparin in purge. Hgb 12.9. Bleeding resolved from 1 groin site which was of major concern earlier. Other access site on groin with noted bleeding. Discussed with RN and will recheck H&H later and if stable will start low dose heparin purge.     Goal of Therapy:  Monitor platelets by anticoagulation protocol: Yes   Plan:  Recheck H&H at 2030 if stable will start low dose heparin in purge.    Cristela Felt, PharmD Clinical Pharmacist  01/03/2021 7:24 PM   Addendum: Recheck H&H - Hgb 14.1. Hct 407. Plt 150. Continued no bleeding at L groin site (area of major concern earlier) and some oozing at R groin site. No other bleeding. Will start low dose heparin (25 units/mL) in purge and monitor.

## 2021-01-03 NOTE — Progress Notes (Signed)
4 Units PRBC given emergently in cath lab using [place] rapid infuser. Total of 1237mL PRBC given.  [facility] Unit #'s given are the following: [id number]

## 2021-01-03 NOTE — Progress Notes (Signed)
Saw patient at bedside  Needs aggressive potassium replacement 6 runs of 10 mEq 40 mEq per tube now and q.4  Obtain stat potassium  Goal glucose level 140-180  Insulin drip and bicarb drip will continue to drive potassium intracellularly

## 2021-01-03 NOTE — Progress Notes (Signed)
  Patient has remained critically ill throughout the afternoon and evening requiring frequent lab draws and titration of pressors and other support medicines.   This evening has become increasingly acidotic. Labs also notable for severe hypoglycemia and hypokalemia concerning for DKA.   I have d/w PharmD and Dr Lucile Shutters of [facility]. Urine ketones elevated. Will check beta hydroxybutarate and start empiric therapy for DKA.   I  have update his sister by phone.   Total additional CCT throughout late afternoon and evening > 2 hr.   Glori Bickers, MD.

## 2021-01-03 NOTE — Progress Notes (Signed)
PHARMACY NOTE:  ANTIMICROBIAL RENAL DOSAGE ADJUSTMENT  Current antimicrobial regimen includes a mismatch between antimicrobial dosage and estimated renal function.  As per policy approved by the Pharmacy & Therapeutics and Medical Executive Committees, the antimicrobial dosage will be adjusted accordingly.  Current antimicrobial dosage:  Cefepime 2g IV q8h  Indication: Sepsis  Renal Function:  Estimated Creatinine Clearance: 49.2 mL/min (A) (by C-G formula based on SCr of 1.58 mg/dL (H)).  Antimicrobial dosage has been changed to:  Cefepime 2g Q12h for CrCl 30-60 ml/min.  Additional comments:   Thank you for allowing pharmacy to be a part of this patient's care.  Cristela Felt, PharmD Clinical Pharmacist  01/03/2021 9:31 PM

## 2021-01-04 ENCOUNTER — Inpatient Hospital Stay (HOSPITAL_COMMUNITY): Payer: Medicare PPO

## 2021-01-04 DIAGNOSIS — R569 Unspecified convulsions: Secondary | ICD-10-CM | POA: Diagnosis not present

## 2021-01-04 DIAGNOSIS — Z95811 Presence of heart assist device: Secondary | ICD-10-CM | POA: Diagnosis not present

## 2021-01-04 DIAGNOSIS — Z9911 Dependence on respirator [ventilator] status: Secondary | ICD-10-CM | POA: Diagnosis not present

## 2021-01-04 DIAGNOSIS — J810 Acute pulmonary edema: Secondary | ICD-10-CM | POA: Diagnosis not present

## 2021-01-04 DIAGNOSIS — J9601 Acute respiratory failure with hypoxia: Secondary | ICD-10-CM | POA: Diagnosis not present

## 2021-01-04 DIAGNOSIS — R570 Cardiogenic shock: Secondary | ICD-10-CM | POA: Diagnosis not present

## 2021-01-04 LAB — CBC
HCT: 36.9 % — ABNORMAL LOW (ref 39.0–52.0)
HCT: 31.2 % — ABNORMAL LOW (ref 39.0–52.0)
Hemoglobin: 13.1 g/dL (ref 13.0–17.0)
Hemoglobin: 11.1 g/dL — ABNORMAL LOW (ref 13.0–17.0)
MCH: 30.2 pg (ref 26.0–34.0)
MCH: 30.8 pg (ref 26.0–34.0)
MCHC: 35.5 g/dL (ref 30.0–36.0)
MCHC: 35.6 g/dL (ref 30.0–36.0)
MCV: 85 fL (ref 80.0–100.0)
MCV: 86.7 fL (ref 80.0–100.0)
Platelets: 121 10*3/uL — ABNORMAL LOW (ref 150–400)
Platelets: 81 10*3/uL — ABNORMAL LOW (ref 150–400)
RBC: 4.34 MIL/uL (ref 4.22–5.81)
RBC: 3.6 MIL/uL — ABNORMAL LOW (ref 4.22–5.81)
RDW: 14.3 % (ref 11.5–15.5)
RDW: 14.8 % (ref 11.5–15.5)
WBC: 18.5 10*3/uL — ABNORMAL HIGH (ref 4.0–10.5)
WBC: 22.9 10*3/uL — ABNORMAL HIGH (ref 4.0–10.5)
nRBC: 0 % (ref 0.0–0.2)
nRBC: 0 % (ref 0.0–0.2)

## 2021-01-04 LAB — PROCALCITONIN: Procalcitonin: 0.77 ng/mL

## 2021-01-04 LAB — POCT I-STAT 7, (LYTES, BLD GAS, ICA,H+H)
Acid-base deficit: 6 mmol/L — ABNORMAL HIGH (ref 0.0–2.0)
Acid-base deficit: 1 mmol/L (ref 0.0–2.0)
Acid-base deficit: 2 mmol/L (ref 0.0–2.0)
Acid-base deficit: 1 mmol/L (ref 0.0–2.0)
Bicarbonate: 17.2 mmol/L — ABNORMAL LOW (ref 20.0–28.0)
Bicarbonate: 21.8 mmol/L (ref 20.0–28.0)
Bicarbonate: 22.2 mmol/L (ref 20.0–28.0)
Bicarbonate: 22.6 mmol/L (ref 20.0–28.0)
Calcium, Ion: 0.98 mmol/L — ABNORMAL LOW (ref 1.15–1.40)
Calcium, Ion: 0.94 mmol/L — ABNORMAL LOW (ref 1.15–1.40)
Calcium, Ion: 0.98 mmol/L — ABNORMAL LOW (ref 1.15–1.40)
Calcium, Ion: 0.93 mmol/L — ABNORMAL LOW (ref 1.15–1.40)
HCT: 36 % — ABNORMAL LOW (ref 39.0–52.0)
HCT: 29 % — ABNORMAL LOW (ref 39.0–52.0)
HCT: 29 % — ABNORMAL LOW (ref 39.0–52.0)
HCT: 30 % — ABNORMAL LOW (ref 39.0–52.0)
Hemoglobin: 12.2 g/dL — ABNORMAL LOW (ref 13.0–17.0)
Hemoglobin: 9.9 g/dL — ABNORMAL LOW (ref 13.0–17.0)
Hemoglobin: 9.9 g/dL — ABNORMAL LOW (ref 13.0–17.0)
Hemoglobin: 10.2 g/dL — ABNORMAL LOW (ref 13.0–17.0)
O2 Saturation: 96 %
O2 Saturation: 89 %
O2 Saturation: 90 %
O2 Saturation: 89 %
Patient temperature: 36.7
Patient temperature: 38
Patient temperature: 38
Patient temperature: 37.9
Potassium: 2.7 mmol/L — CL (ref 3.5–5.1)
Potassium: 6.3 mmol/L (ref 3.5–5.1)
Potassium: 6.1 mmol/L — ABNORMAL HIGH (ref 3.5–5.1)
Potassium: 5.8 mmol/L — ABNORMAL HIGH (ref 3.5–5.1)
Sodium: 140 mmol/L (ref 135–145)
Sodium: 138 mmol/L (ref 135–145)
Sodium: 136 mmol/L (ref 135–145)
Sodium: 137 mmol/L (ref 135–145)
TCO2: 18 mmol/L — ABNORMAL LOW (ref 22–32)
TCO2: 23 mmol/L (ref 22–32)
TCO2: 23 mmol/L (ref 22–32)
TCO2: 24 mmol/L (ref 22–32)
pCO2 arterial: 26.4 mmHg — ABNORMAL LOW (ref 32.0–48.0)
pCO2 arterial: 31.4 mmHg — ABNORMAL LOW (ref 32.0–48.0)
pCO2 arterial: 34.7 mmHg (ref 32.0–48.0)
pCO2 arterial: 34.9 mmHg (ref 32.0–48.0)
pH, Arterial: 7.42 (ref 7.350–7.450)
pH, Arterial: 7.452 — ABNORMAL HIGH (ref 7.350–7.450)
pH, Arterial: 7.417 (ref 7.350–7.450)
pH, Arterial: 7.423 (ref 7.350–7.450)
pO2, Arterial: 76 mmHg — ABNORMAL LOW (ref 83.0–108.0)
pO2, Arterial: 57 mmHg — ABNORMAL LOW (ref 83.0–108.0)
pO2, Arterial: 60 mmHg — ABNORMAL LOW (ref 83.0–108.0)
pO2, Arterial: 58 mmHg — ABNORMAL LOW (ref 83.0–108.0)

## 2021-01-04 LAB — BASIC METABOLIC PANEL
Anion gap: 12 (ref 5–15)
Anion gap: 15 (ref 5–15)
Anion gap: 8 (ref 5–15)
Anion gap: 5 (ref 5–15)
Anion gap: 3 — ABNORMAL LOW (ref 5–15)
Anion gap: 4 — ABNORMAL LOW (ref 5–15)
Anion gap: 4 — ABNORMAL LOW (ref 5–15)
BUN: 19 mg/dL (ref 8–23)
BUN: 20 mg/dL (ref 8–23)
BUN: 19 mg/dL (ref 8–23)
BUN: 19 mg/dL (ref 8–23)
BUN: 19 mg/dL (ref 8–23)
BUN: 20 mg/dL (ref 8–23)
BUN: 20 mg/dL (ref 8–23)
CO2: 15 mmol/L — ABNORMAL LOW (ref 22–32)
CO2: 16 mmol/L — ABNORMAL LOW (ref 22–32)
CO2: 19 mmol/L — ABNORMAL LOW (ref 22–32)
CO2: 22 mmol/L (ref 22–32)
CO2: 23 mmol/L (ref 22–32)
CO2: 24 mmol/L (ref 22–32)
CO2: 23 mmol/L (ref 22–32)
Calcium: 6.5 mg/dL — ABNORMAL LOW (ref 8.9–10.3)
Calcium: 6.6 mg/dL — ABNORMAL LOW (ref 8.9–10.3)
Calcium: 6.7 mg/dL — ABNORMAL LOW (ref 8.9–10.3)
Calcium: 6.5 mg/dL — ABNORMAL LOW (ref 8.9–10.3)
Calcium: 6.6 mg/dL — ABNORMAL LOW (ref 8.9–10.3)
Calcium: 6.2 mg/dL — CL (ref 8.9–10.3)
Calcium: 6.2 mg/dL — CL (ref 8.9–10.3)
Chloride: 108 mmol/L (ref 98–111)
Chloride: 109 mmol/L (ref 98–111)
Chloride: 109 mmol/L (ref 98–111)
Chloride: 112 mmol/L — ABNORMAL HIGH (ref 98–111)
Chloride: 113 mmol/L — ABNORMAL HIGH (ref 98–111)
Chloride: 110 mmol/L (ref 98–111)
Chloride: 109 mmol/L (ref 98–111)
Creatinine, Ser: 1.66 mg/dL — ABNORMAL HIGH (ref 0.61–1.24)
Creatinine, Ser: 1.69 mg/dL — ABNORMAL HIGH (ref 0.61–1.24)
Creatinine, Ser: 1.62 mg/dL — ABNORMAL HIGH (ref 0.61–1.24)
Creatinine, Ser: 1.44 mg/dL — ABNORMAL HIGH (ref 0.61–1.24)
Creatinine, Ser: 1.34 mg/dL — ABNORMAL HIGH (ref 0.61–1.24)
Creatinine, Ser: 1.41 mg/dL — ABNORMAL HIGH (ref 0.61–1.24)
Creatinine, Ser: 1.41 mg/dL — ABNORMAL HIGH (ref 0.61–1.24)
GFR, Estimated: 43 mL/min — ABNORMAL LOW (ref >60)
GFR, Estimated: 44 mL/min — ABNORMAL LOW (ref >60)
GFR, Estimated: 45 mL/min — ABNORMAL LOW (ref >60)
GFR, Estimated: 52 mL/min — ABNORMAL LOW (ref >60)
GFR, Estimated: 57 mL/min — ABNORMAL LOW (ref >60)
GFR, Estimated: 54 mL/min — ABNORMAL LOW (ref >60)
GFR, Estimated: 54 mL/min — ABNORMAL LOW (ref >60)
Glucose, Bld: 413 mg/dL — ABNORMAL HIGH (ref 70–99)
Glucose, Bld: 423 mg/dL — ABNORMAL HIGH (ref 70–99)
Glucose, Bld: 295 mg/dL — ABNORMAL HIGH (ref 70–99)
Glucose, Bld: 165 mg/dL — ABNORMAL HIGH (ref 70–99)
Glucose, Bld: 89 mg/dL (ref 70–99)
Glucose, Bld: 163 mg/dL — ABNORMAL HIGH (ref 70–99)
Glucose, Bld: 162 mg/dL — ABNORMAL HIGH (ref 70–99)
Potassium: 2.3 mmol/L — CL (ref 3.5–5.1)
Potassium: 2.4 mmol/L — CL (ref 3.5–5.1)
Potassium: 2.6 mmol/L — CL (ref 3.5–5.1)
Potassium: 3 mmol/L — ABNORMAL LOW (ref 3.5–5.1)
Potassium: 4 mmol/L (ref 3.5–5.1)
Potassium: 6.3 mmol/L (ref 3.5–5.1)
Potassium: 6.1 mmol/L — ABNORMAL HIGH (ref 3.5–5.1)
Sodium: 137 mmol/L (ref 135–145)
Sodium: 138 mmol/L (ref 135–145)
Sodium: 136 mmol/L (ref 135–145)
Sodium: 139 mmol/L (ref 135–145)
Sodium: 139 mmol/L (ref 135–145)
Sodium: 138 mmol/L (ref 135–145)
Sodium: 136 mmol/L (ref 135–145)

## 2021-01-04 LAB — GLUCOSE, CAPILLARY
Glucose-Capillary: 261 mg/dL — ABNORMAL HIGH (ref 70–99)
Glucose-Capillary: 312 mg/dL — ABNORMAL HIGH (ref 70–99)
Glucose-Capillary: 322 mg/dL — ABNORMAL HIGH (ref 70–99)
Glucose-Capillary: 340 mg/dL — ABNORMAL HIGH (ref 70–99)
Glucose-Capillary: 349 mg/dL — ABNORMAL HIGH (ref 70–99)
Glucose-Capillary: 388 mg/dL — ABNORMAL HIGH (ref 70–99)
Glucose-Capillary: 390 mg/dL — ABNORMAL HIGH (ref 70–99)
Glucose-Capillary: 399 mg/dL — ABNORMAL HIGH (ref 70–99)
Glucose-Capillary: 400 mg/dL — ABNORMAL HIGH (ref 70–99)
Glucose-Capillary: 407 mg/dL — ABNORMAL HIGH (ref 70–99)
Glucose-Capillary: 407 mg/dL — ABNORMAL HIGH (ref 70–99)
Glucose-Capillary: 101 mg/dL — ABNORMAL HIGH (ref 70–99)
Glucose-Capillary: 123 mg/dL — ABNORMAL HIGH (ref 70–99)
Glucose-Capillary: 171 mg/dL — ABNORMAL HIGH (ref 70–99)
Glucose-Capillary: 199 mg/dL — ABNORMAL HIGH (ref 70–99)
Glucose-Capillary: 209 mg/dL — ABNORMAL HIGH (ref 70–99)
Glucose-Capillary: 260 mg/dL — ABNORMAL HIGH (ref 70–99)
Glucose-Capillary: 79 mg/dL (ref 70–99)
Glucose-Capillary: 82 mg/dL (ref 70–99)
Glucose-Capillary: 92 mg/dL (ref 70–99)
Glucose-Capillary: 123 mg/dL — ABNORMAL HIGH (ref 70–99)
Glucose-Capillary: 152 mg/dL — ABNORMAL HIGH (ref 70–99)
Glucose-Capillary: 133 mg/dL — ABNORMAL HIGH (ref 70–99)
Glucose-Capillary: 166 mg/dL — ABNORMAL HIGH (ref 70–99)

## 2021-01-04 LAB — PHOSPHORUS
Phosphorus: 1.7 mg/dL — ABNORMAL LOW (ref 2.5–4.6)
Phosphorus: 3 mg/dL (ref 2.5–4.6)

## 2021-01-04 LAB — COOXEMETRY PANEL
Carboxyhemoglobin: 0.7 % (ref 0.5–1.5)
Methemoglobin: 1.1 % (ref 0.0–1.5)
O2 Saturation: 67.2 %
Total hemoglobin: 13.1 g/dL (ref 12.0–16.0)

## 2021-01-04 LAB — TRIGLYCERIDES: Triglycerides: 50 mg/dL (ref <150)

## 2021-01-04 LAB — LACTIC ACID, PLASMA
Lactic Acid, Venous: 5.5 mmol/L (ref 0.5–1.9)
Lactic Acid, Venous: 2.3 mmol/L (ref 0.5–1.9)

## 2021-01-04 LAB — LACTATE DEHYDROGENASE: LDH: 347 U/L — ABNORMAL HIGH (ref 98–192)

## 2021-01-04 LAB — HEPARIN LEVEL (UNFRACTIONATED): Heparin Unfractionated: <0.1 IU/mL — ABNORMAL LOW (ref 0.30–0.70)

## 2021-01-04 LAB — MAGNESIUM
Magnesium: 2.2 mg/dL (ref 1.7–2.4)
Magnesium: 2 mg/dL (ref 1.7–2.4)

## 2021-01-04 MED ORDER — CALCIUM GLUCONATE-NACL 1-0.675 GM/50ML-% IV SOLN
1.0000 g | Freq: Once | INTRAVENOUS | Status: AC
  Administered 2021-01-04: 1000 mg via INTRAVENOUS
  Filled 2021-01-04: qty 50

## 2021-01-04 MED ORDER — ACETAMINOPHEN 160 MG/5ML PO SOLN
650.0000 mg | ORAL | Status: DC | PRN
  Administered 2021-01-04 – 2021-01-19 (×3): 650 mg
  Filled 2021-01-04 (×4): qty 20.3

## 2021-01-04 MED ORDER — INSULIN ASPART 100 UNIT/ML ~~LOC~~ SOLN
2.0000 [IU] | SUBCUTANEOUS | Status: DC
  Administered 2021-01-04: 2 [IU] via SUBCUTANEOUS
  Administered 2021-01-04: 4 [IU] via SUBCUTANEOUS
  Administered 2021-01-05: 2 [IU] via SUBCUTANEOUS
  Administered 2021-01-05 (×2): 4 [IU] via SUBCUTANEOUS
  Administered 2021-01-05: 2 [IU] via SUBCUTANEOUS
  Administered 2021-01-05: 4 [IU] via SUBCUTANEOUS
  Administered 2021-01-06 – 2021-01-08 (×5): 2 [IU] via SUBCUTANEOUS
  Administered 2021-01-08: 4 [IU] via SUBCUTANEOUS
  Administered 2021-01-08: 2 [IU] via SUBCUTANEOUS
  Administered 2021-01-08 – 2021-01-09 (×3): 4 [IU] via SUBCUTANEOUS
  Administered 2021-01-09: 2 [IU] via SUBCUTANEOUS
  Administered 2021-01-09: 4 [IU] via SUBCUTANEOUS
  Administered 2021-01-09 (×2): 2 [IU] via SUBCUTANEOUS
  Administered 2021-01-09 – 2021-01-10 (×4): 4 [IU] via SUBCUTANEOUS
  Administered 2021-01-10: 2 [IU] via SUBCUTANEOUS
  Administered 2021-01-10 – 2021-01-11 (×6): 4 [IU] via SUBCUTANEOUS
  Administered 2021-01-12 (×3): 2 [IU] via SUBCUTANEOUS
  Administered 2021-01-12 – 2021-01-13 (×2): 4 [IU] via SUBCUTANEOUS
  Administered 2021-01-13: 2 [IU] via SUBCUTANEOUS
  Administered 2021-01-13: 4 [IU] via SUBCUTANEOUS
  Administered 2021-01-13: 2 [IU] via SUBCUTANEOUS
  Administered 2021-01-14: 4 [IU] via SUBCUTANEOUS
  Administered 2021-01-14 (×5): 2 [IU] via SUBCUTANEOUS
  Administered 2021-01-14: 4 [IU] via SUBCUTANEOUS
  Administered 2021-01-15 (×2): 2 [IU] via SUBCUTANEOUS
  Administered 2021-01-15: 4 [IU] via SUBCUTANEOUS
  Administered 2021-01-15 (×2): 2 [IU] via SUBCUTANEOUS

## 2021-01-04 MED ORDER — SODIUM CHLORIDE 0.9 % IV SOLN
2.0000 g | Freq: Three times a day (TID) | INTRAVENOUS | Status: AC
  Administered 2021-01-04 – 2021-01-05 (×5): 2 g via INTRAVENOUS
  Filled 2021-01-04 (×5): qty 2

## 2021-01-04 MED ORDER — POTASSIUM CHLORIDE 10 MEQ/50ML IV SOLN
10.0000 meq | INTRAVENOUS | Status: AC
  Administered 2021-01-04 (×4): 10 meq via INTRAVENOUS
  Filled 2021-01-04 (×4): qty 50

## 2021-01-04 MED ORDER — POTASSIUM PHOSPHATES 15 MMOLE/5ML IV SOLN
20.0000 mmol | Freq: Once | INTRAVENOUS | Status: AC
  Administered 2021-01-04: 20 mmol via INTRAVENOUS
  Filled 2021-01-04: qty 6.67

## 2021-01-04 MED ORDER — CALCIUM GLUCONATE-NACL 2-0.675 GM/100ML-% IV SOLN
2.0000 g | Freq: Once | INTRAVENOUS | Status: AC
  Administered 2021-01-04: 2000 mg via INTRAVENOUS
  Filled 2021-01-04: qty 100

## 2021-01-04 MED ORDER — INSULIN ASPART 100 UNIT/ML ~~LOC~~ SOLN
2.0000 [IU] | SUBCUTANEOUS | Status: DC
  Administered 2021-01-04 – 2021-01-06 (×10): 2 [IU] via SUBCUTANEOUS

## 2021-01-04 MED ORDER — INSULIN DETEMIR 100 UNIT/ML ~~LOC~~ SOLN
30.0000 [IU] | Freq: Every day | SUBCUTANEOUS | Status: DC
  Administered 2021-01-04 – 2021-01-05 (×2): 30 [IU] via SUBCUTANEOUS
  Administered 2021-01-06: 25 [IU] via SUBCUTANEOUS
  Filled 2021-01-04 (×3): qty 0.3

## 2021-01-04 MED ORDER — VITAL 1.5 CAL PO LIQD
1000.0000 mL | ORAL | Status: DC
  Administered 2021-01-04 – 2021-01-06 (×3): 1000 mL

## 2021-01-04 MED ORDER — POTASSIUM CHLORIDE 20 MEQ PO PACK
40.0000 meq | PACK | Freq: Once | ORAL | Status: AC
  Administered 2021-01-04: 40 meq
  Filled 2021-01-04: qty 2

## 2021-01-04 MED ORDER — VITAL HIGH PROTEIN PO LIQD: 1000.0000 mL | ORAL | Status: DC

## 2021-01-04 MED ORDER — PROSOURCE TF PO LIQD
45.0000 mL | Freq: Three times a day (TID) | ORAL | Status: DC
  Administered 2021-01-04 – 2021-01-09 (×14): 45 mL
  Filled 2021-01-04 (×15): qty 45

## 2021-01-04 MED ORDER — POTASSIUM & SODIUM PHOSPHATES 280-160-250 MG PO PACK
2.0000 | PACK | Freq: Once | ORAL | Status: AC
  Administered 2021-01-04: 2
  Filled 2021-01-04: qty 2

## 2021-01-04 MED ORDER — MAGNESIUM SULFATE 2 GM/50ML IV SOLN
2.0000 g | Freq: Once | INTRAVENOUS | Status: AC
  Administered 2021-01-04: 2 g via INTRAVENOUS
  Filled 2021-01-04: qty 50

## 2021-01-04 MED ORDER — FUROSEMIDE 10 MG/ML IJ SOLN
60.0000 mg | Freq: Once | INTRAMUSCULAR | Status: AC
  Administered 2021-01-04: 60 mg via INTRAVENOUS
  Filled 2021-01-04: qty 6

## 2021-01-04 MED ORDER — POTASSIUM CHLORIDE 20 MEQ PO PACK
60.0000 meq | PACK | Freq: Once | ORAL | Status: AC
  Administered 2021-01-04: 60 meq
  Filled 2021-01-04: qty 3

## 2021-01-04 MED ORDER — PROSOURCE TF PO LIQD
45.0000 mL | Freq: Two times a day (BID) | ORAL | Status: DC
  Administered 2021-01-04: 45 mL
  Filled 2021-01-04: qty 45

## 2021-01-04 MED ORDER — DEXTROSE 10 % IV SOLN: INTRAVENOUS | Status: DC | PRN

## 2021-01-04 MED ORDER — INSULIN ASPART 100 UNIT/ML ~~LOC~~ SOLN: 2.0000 [IU] | SUBCUTANEOUS | Status: DC

## 2021-01-04 NOTE — Progress Notes (Signed)
  Remains intubated on Impella. .   Pressor requirements and lactates coming down.   Will arouse to voice but not follow commands.   EEG with diffuse nonspecific encephalopathy.  CI now about 2.5. CVP 10-11  Groin site stable.   Will continue current support and hope for continued hemodynamic and Neuro improvement.  I updated his sister Vickie by telephone.    Additional CCT 35  Mins.   Glori Bickers, MD  5:48 PM

## 2021-01-04 NOTE — Progress Notes (Addendum)
FiO2 increased to 75% with SpO2 90%. PEEP increased to 8>> 10 without hemodynamic changes.   I/O +3L today, +10L for this admission. Lasix x 1 dose this evening for goal net even for the shift.  ABG with respiratory alkalosis, bicarb in 20s now. Bicarb drip decreased to 50cc/h, RR decreased to 18.   D/w bedside RN.  Julian Hy, DO 01/04/21 6:18 PM [facility]

## 2021-01-04 NOTE — Progress Notes (Signed)
[facility] Progress Note Patient Name: [name] DOB: 06-03-1951 MRN: [id number]   Date of Service  01/04/2021  HPI/Events of Note  Interval K+ is 2.6  eICU Interventions  Additional 40 meq KCL ordered infused over 4 hours via central  Line for a total of 160 meq  KCL before the next K+ check.        Kerry Kass Myers [place] 01/04/2021, 5:45 AM

## 2021-01-04 NOTE — Procedures (Signed)
Patient Name: [name]  MRN: [id number]  Epilepsy Attending: Lora Havens  Referring Physician/Provider: Dr. Noemi Chapel Date: 01/04/2021 Duration: 23.37 mins  Patient history: 70 year old male with cardiac arrest, now with seizure-like episodes.  EEG to evaluate for seizures.  Level of alertness: Lethargic/comatose  AEDs during EEG study: Propofol  Technical aspects: This EEG study was done with scalp electrodes positioned according to the 10-20 International system of electrode placement. Electrical activity was acquired at a sampling rate of 500Hz  and reviewed with a high frequency filter of 70Hz  and a low frequency filter of 1Hz . EEG data were recorded continuously and digitally stored.   Description: EEG showed continuous generalized 3 to 5 Hz polymorphic sharply contoured theta-delta slowing.  Patient was noted to have shivering episodes during the study.  Concomitant EEG before, during and after the study did not show any EEG change to suggest seizure.  Hyperventilation and photic stimulation were not performed.     ABNORMALITY -Continuous slow, generalized  IMPRESSION: This study is suggestive of severe diffuse encephalopathy, nonspecific etiology.  Patient was noted of shivering-like episodes during the study without concomitant EEG change and were most likely not epileptic. No seizures or definite epileptiform discharges were seen throughout the recording.  Tenea Sens Barbra Sarks

## 2021-01-04 NOTE — Progress Notes (Signed)
EEG completed, results pending. 

## 2021-01-04 NOTE — Progress Notes (Signed)
[facility] Progress Note Patient Name: [name] DOB: 12-Apr-1951 MRN: [id number]   Date of Service  01/04/2021  HPI/Events of Note  K+ down to 5.8 from 6.2 with diuresis (patient is - 1000 ml in response to 60 mg of Lasix),  Ionized calcium is 0.96, last PO2 was 58 and he is now on 100 % FIO2.  eICU Interventions  Calcium gluconate 2 gm iv  Bolus ordered, PEEP increased to 12 to allow for possible weaning of FIO2, ABG at 1 a.m.        Kerry Kass Tyrik Stetzer 01/04/2021, 10:51 PM

## 2021-01-04 NOTE — Progress Notes (Signed)
ANTICOAGULATION CONSULT NOTE - Follow Up Consult  Pharmacy Consult for heparin Indication: Impella   Allergies  Allergen Reactions  . Codeine Other (See Comments)    hallucinations  . Lisinopril Cough    Patient Measurements: Weight: 104.7 kg (230 lb 13.2 oz)   Vital Signs: Temp: 99.32 F (37.4 C) (04/15 1100) BP: 95/73 (04/15 1100) Pulse Rate: 100 (04/15 1100)  Labs: Recent Labs    01/03/21 1107 01/03/21 1251 01/03/21 1446 01/03/21 1809 01/03/21 1812 01/03/21 2243 01/03/21 2305 01/03/21 2327 01/04/21 0305 01/04/21 0403 01/04/21 0413 01/04/21 0751  HGB 11.0* 15.0   < > 13.3   < > 13.3  --   --  13.1 12.2*  --   --   HCT 32.4* 43.8   < > 39.1   < > 39.0  --   --  36.9* 36.0*  --   --   PLT 215 165  --  150  --   --   --   --  121*  --   --   --   LABPROT 17.8*  --   --   --   --   --   --   --   --   --   --   --   INR 1.5*  --   --   --   --   --   --   --   --   --   --   --   HEPARINUNFRC  --   --   --  <0.10*  --   --   --   --   --   --  <0.10*  --   CREATININE 1.17  --   --  1.58*   < >  --    < > 1.69* 1.62*  --   --  1.44*   < > = values in this interval not displayed.    Estimated Creatinine Clearance: 60.6 mL/min (A) (by C-G formula based on SCr of 1.44 mg/dL (H)).   Medical History: Past Medical History:  Diagnosis Date  . Anxiety   . Arthritis   . Cancer ([place]) 2010   basal cell ca, head  . Cataract    bilateral  . Depression   . ED (erectile dysfunction)   . GERD (gastroesophageal reflux disease)   . Hyperlipidemia   . Hypertension   . Kidney stone 07-29-11   passed   . 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 Vitreous floater    left eye, sees Dr. Dawna [facility] at [facility]      Assessment: 67yom admitted for STEMI s/p VF arrest shock and impella + DES to RCA. Groin bleeding after cangrelor used prior to OG being placed and ticagrelor given cangrelor now off, PRBC x4 given and groin stable.   Purge solution changed to  low dose heparin 25uts/ml last pm with stable CBC and resolved groin bleeding.   Heparin level <0.1 undetectable this am on purge flow 34ml/hr = 300 uts/hr heparin, purge pressure 400-500       Goal of Therapy:  Monitor platelets by anticoagulation protocol: Yes   Plan:  Continue low dose heparin purge  Follow up CBC and daily heparin level    Bonnita Nasuti Pharm.D. CPP, BCPS Clinical Pharmacist [phone] 01/04/2021 1:34 PM

## 2021-01-04 NOTE — Progress Notes (Addendum)
Initial Nutrition Assessment  DOCUMENTATION CODES:   Not applicable  INTERVENTION:   Tube Feeding via OG:  Initiate trickle TF of Vital 1.5 today per MD Goal Rate: Vital 1.5 at 55 ml/hr Pro-Source TF 45 mL TID Provides 2100 kcals, 122 g of protein, 1003 mL of water   NUTRITION DIAGNOSIS:   Inadequate oral intake related to acute illness as evidenced by NPO status.  GOAL:   Patient will meet greater than or equal to 90% of their needs  MONITOR:   Vent status,TF tolerance,Labs,Weight trends  REASON FOR ASSESSMENT:   Ventilator    ASSESSMENT:   70 yo male admitted with STEMI with cardiogenic shock, cardiac arrest and underwent PCI with stent to RCA, Impella placement  4/14 Cardiac arrest, stent to RCA, Impella, Intubated  Pt remains on vent support, sedated. Requiring levophed, vasopressin, epinephrine but requirements decreasing  MD does not want Cortrak inserted at this time due to risk of sinusitis. OG tube located in stomach  Unable to obtain diet and weight history from patient at this time  Current wt 105 kg; unsure of dry weight but previous weight encounters from as far back as Jan 2021 between 85-89 kg. Weight of 87 kg April, plan to utilize this as dry weight at present  Labs: potassium 3.0 (L), Creatinine 1.44 Meds: ss novolog, levemir, insulin, potassium phosphate, sodium bicarbonae    Diet Order:   Diet Order            Diet NPO time specified  Diet effective now                 EDUCATION NEEDS:   Not appropriate for education at this time  Skin:  Skin Assessment: Reviewed RN Assessment  Last BM:  no documented BM  Height:   Ht Readings from Last 1 Encounters:  12/05/20 6\' 1"  (1.854 m)    Weight:   Wt Readings from Last 1 Encounters:  01/04/21 104.7 kg     BMI:  Body mass index is 30.45 kg/m.  Estimated Nutritional Needs:   Kcal:  2000-2200 kcals  Protein:  115-130 g  Fluid:  >/= 1.8 L   Kerman Passey MS, RDN, LDN,  CNSC Registered Dietitian III Clinical Nutrition RD Pager and On-Call Pager Number Located in [place]

## 2021-01-04 NOTE — Progress Notes (Signed)
NAME:  [name], MRN:  [id number], DOB:  [birth year], LOS: 1 ADMISSION DATE:  01/03/2021, CONSULTATION DATE:  01/03/2021 REFERRING MD:  Dr. Haroldine Laws, CHIEF COMPLAINT:  STEMI/ cardiogenic shock  History of Present Illness:  HPI obtained from medical chart review as patient is currently intubated and sedated on mechanical ventilation.  70 year old male with prior history of hypertension, hyperlipidemia, and CAD presenting by EMS with sudden onset of chest pain found to have inferior STEMI with bradycardia and hypotension.  Taken emergently to Cath Lab on arrival.  A temporary pacer was placed with good capture, but then developed profound hypotension decompensating to cardiac arrest, requiring intubation by anesthesia, CPR, episodes of V. fib requiring defibrillation and amiodarone.  Found to have total proximal RCA occlusion and underwent PCI to the RCA with stent placement.  Procedure further complicated by cardiogenic shock requiring placement of Impella device complicated by bleeding secondary to cangrelor with development of bleeding and hematoma at puncture sites, Hgb 12.6-> 7.5,  requiring emergent transfusion of 4 units PRBC and stopping of cangrelor with stabilization of hemodynamics but remains on NE, epi, and vasopressin drips.  Patient returns to ICU intubated and sedated in cardiogenic shock with TVP and impella.  Unable to place aline in cath lab secondary to bleeding.  Labs pending.  PCCM consulted for vent management.   Pertinent  Medical History  HTN, HLD, CAD  Significant Hospital Events: Including procedures, antibiotic start and stop dates in addition to other pertinent events   . 4/14 admitted Cards with inferior STEMI-> bradycardia s/p TVP, cardiogenic shock and cardiac arrest s/p impella with PCI/stent to RCA, complicated by bleeding s/p 4 units PRBC thought 2/2 cangrelor, pending coags.  Has R IJ PA cath, TVP R groin, impella L groin  Interim History / Subjective:   Required insulin infusion overnight.  Still on bicarb.  Objective   Blood pressure 100/79, pulse 100, temperature 97.88 F (36.6 C), resp. rate (!) 25, weight 104.7 kg, SpO2 97 %. PAP: (21-31)/(10-19) 23/15 CVP:  [1 mmHg-14 mmHg] 10 mmHg CO:  [2.8 L/min-4.7 L/min] 4.3 L/min CI:  [1.3 L/min/m2-2.2 L/min/m2] 2 L/min/m2  Vent Mode: PRVC FiO2 (%):  [50 %-100 %] 50 % Set Rate:  [16 bmp-25 bmp] 25 bmp Vt Set:  [640 mL] 640 mL PEEP:  [5 cmH20] 5 cmH20 Plateau Pressure:  [17 cmH20-27 cmH20] 20 cmH20   Intake/Output Summary (Last 24 hours) at 01/04/2021 0750 Last data filed at 01/04/2021 0700 Gross per 24 hour  Intake 8929.88 ml  Output 1285 ml  Net 7644.88 ml   Filed Weights   01/04/21 0500  Weight: 104.7 kg    Examination: General: critically ill appearing man laying in bed intubated, sedated, impella & TVP in place HEENT: [place]/AT, eyes anicteric, ETT in place.  Neuro: RASS -5, +cough reflex, moving feet nonpurposefully but not withdrawing from pain. Some whole body shaking with propofol off CV: S1S2, tachycardic, reg rhythm PULM:  Faint rhales, breathing synchronously with MV GI: soft, NT, ND, hypoactive BS Extremities: mild peripheral edema, no cyanosis. Hematoma left groin stable, impella left groin, TVP R groin. Skin: pallor, warm & dry GU: hematoma expanded into left scrotum with bruising, foley  Labs/imaging that I have personally reviewed  (right click and "Reselect all SmartList Selections" daily)  4/14 LHC >>  Ost LAD to Prox LAD lesion is 50% stenosed.  Mid LAD lesion is 20% stenosed.  Prox RCA-1 lesion is 100% stenosed.  Prox RCA-2 lesion is 50% stenosed.  A stent  was successfully placed.  Post intervention, there is a 0% residual stenosis.  Post intervention, there is a 0% residual stenosis.  LA 5.5, downtrending coox 67% BUN 19 Cr. 1.44 K+ 3.0  CXR personally reviewed> ET tube at the level of the clavicles.  Pulmonary edema, LLL opacity  Resolved  Hospital Problem list    Assessment & Plan:   Inferior STEMI s/p PCI/ stent to main dominant RCA requiring mechanical and inotropic support Cardiogenic shock due to ischemic cardiomyopathy Vfib cardiac arrest  Bradycardia s/p TVP P:  -Inotropic & vasopressor support per cardiology-- currently on epi, norepi, milrinone, vasopressin -Impella management per Cardiology- currently P-6 -con't DAPT with ASA & brillinta -amiodarone infusion -Continue broad empiric antibiotics for multiple devices  Hemorrhagic shock, hematoma around Impella insertion site due to cangrelor infusion requiring during LHC. Hematoma and H/H stable today. - s/p emergent 4 units PRBC - monitor for bleeding -serial CBCs -Transfuse for hemoglobin less than 7 or hemodynamically significant bleeding  Acute hypoxic respiratory failure secondary to above P:  -Continue low tidal volume ventilation, 4 to 8 cc/kg ideal body weight with goal plateau's and 30 and driving pressure less than 15.  Continue increased respiratory rate to help compensate for metabolic acidosis. -Titrate down FiO2 as able to maintain SPO2 greater than 90%. -VAP prevention protocol -Pad protocol for sedation.  Propofol turned back on until EEG can be obtained. -ET tube advanced today based on chest x-ray.  Hyperglycemia -Insulin infusion -Goal BG 140-180  AKI-stable/improving -Renally dose meds, avoid nephrotoxic meds -Strict I/os -Retain adequate perfusion for kidneys  Hypokalemia Hypocalcemia -Continue to monitor -Repleted -Repeat BMP later this morning  Lactic acidosis due to multifactorial shock -Continue to monitor -Bicarb infusion; follow up BMP later  H/o HTN; currently in shock P:  - hold home meds in the setting of  shock   HLD -con't crestor  Hyperglycemia  P:  - recheck, may need insulin gtt vs SSI   Leukocytosis  P:  - likely reactive, but continue empiric antibiotics  At risk for malnutrition -Start trickle  feeds; as hemodynamic status improves can advance, but remains in significant cardiogenic shock at this time.   Best practice (right click and "Reselect all SmartList Selections" daily)  Diet:  Tube Feed  Pain/Anxiety/Delirium protocol (if indicated): Yes (RASS goal -3,-4) VAP protocol (if indicated): Yes DVT prophylaxis: Systemic AC GI prophylaxis: PPI Glucose control:  Insulin gtt Central venous access:  Yes, and it is still needed Arterial line:  Yes, and it is still needed Foley:  Yes, and it is still needed Mobility:  bed rest  PT consulted: N/A Last date of multidisciplinary goals of care discussion [4/14 per cardiology ] Code Status:  full code Disposition: 2H ICU   Labs   CBC: Recent Labs  Lab 01/03/21 1107 01/03/21 1251 01/03/21 1446 01/03/21 1809 01/03/21 1812 01/03/21 2029 01/03/21 2202 01/03/21 2243 01/04/21 0305 01/04/21 0403  WBC 25.3* 24.0*  --  22.0*  --   --   --   --  18.5*  --   HGB 11.0* 15.0   < > 13.3   < > 14.1 13.3 13.3 13.1 12.2*  HCT 32.4* 43.8   < > 39.1   < > 40.7 39.0 39.0 36.9* 36.0*  MCV 91.3 89.0  --  89.9  --   --   --   --  85.0  --   PLT 215 165  --  150  --   --   --   --  121*  --    < > = values in this interval not displayed.    Basic Metabolic Panel: Recent Labs  Lab 01/03/21 1251 01/03/21 1446 01/03/21 1809 01/03/21 1812 01/03/21 2029 01/03/21 2202 01/03/21 2243 01/03/21 2305 01/03/21 2327 01/04/21 0305 01/04/21 0403  NA  --    < > 137   < > 138   < > 140 138 137 136 140  K  --    < > 3.0*   < > 2.7*   < > 2.5* 2.4* 2.3* 2.6* 2.7*  CL  --   --  108  --  108  --   --  108 109 109  --   CO2  --   --  14*  --  14*  --   --  15* 16* 19*  --   GLUCOSE  --   --  408*  --  427*  --   --  423* 413* 295*  --   BUN  --   --  21  --  21  --   --  19 20 19   --   CREATININE  --   --  1.58*  --  1.65*  --   --  1.66* 1.69* 1.62*  --   CALCIUM  --   --  6.5*  --  6.5*  --   --  6.5* 6.6* 6.7*  --   MG 1.5*  --   --   --  2.5*  --    --   --   --  2.2  --    < > = values in this interval not displayed.    This patient is critically ill with multiple organ system failure which requires frequent high complexity decision making, assessment, support, evaluation, and titration of therapies. This was completed through the application of advanced monitoring technologies and extensive interpretation of multiple databases. During this encounter critical care time was devoted to patient care services described in this note for 43 minutes.   Julian Hy, DO 01/04/21 10:56 AM [facility]

## 2021-01-04 NOTE — Progress Notes (Addendum)
Advanced Heart Failure Rounding Note  PCP-Cardiologist: No primary care provider on file.   Subjective:    Remains critically ill. Intubated and sedated. No further VF.   Impella at P-6. Flow 2.6 On NE 20, VP 0.03, EPi 8. Off milrinone.   Co-ox 67%. SVR 1200. CVP 11  Lactic acid downtrending, 9.7>>5.5   Hgb 12  K 2.7   Shivering. Temp 36.6. WBC 22>>18K    Objective:   Weight Range: 104.7 kg Body mass index is 30.45 kg/m.   Vital Signs:   Temp:  [90.9 F (32.7 C)-98.6 F (37 C)] 97.88 F (36.6 C) (04/15 0700) Pulse Rate:  [92-101] 100 (04/15 0700) Resp:  [15-25] 25 (04/15 0700) BP: (81-124)/(57-86) 100/79 (04/15 0700) SpO2:  [96 %-100 %] 97 % (04/15 0747) Arterial Line BP: (47-154)/(-15-85) 92/61 (04/15 0700) FiO2 (%):  [50 %-100 %] 50 % (04/15 0747) Weight:  [104.7 kg] 104.7 kg (04/15 0500) Last BM Date:  (PTA)  Weight change: Filed Weights   01/04/21 0500  Weight: 104.7 kg    Intake/Output:   Intake/Output Summary (Last 24 hours) at 01/04/2021 0758 Last data filed at 01/04/2021 0700 Gross per 24 hour  Intake 8929.88 ml  Output 1285 ml  Net 7644.88 ml      Physical Exam    CVP 11  General:  Critically ill, intubated and sedated, shivering   HEENT:  + ETT Normal Neck: Supple. JVP 11-12 cm . Carotids 2+ bilat; no bruits. No lymphadenopathy or thyromegaly appreciated. Cor: PMI nondisplaced. Regular rate & rhythm. No rubs, gallops or murmurs. Lungs: intubated and clear  Abdomen: Soft, nontender, nondistended. No hepatosplenomegaly. No bruits or masses. Good bowel sounds. Extremities: No cyanosis, clubbing, rash, 1+ bilateral LEE + Lt femoral impella+ left groin hematoma Neuro: intubated and sedated   Telemetry   V- paced 100 bpm, brief NSVT. No further VF  EKG    V-paced 100 bpm   Labs    CBC Recent Labs    01/03/21 1809 01/03/21 1812 01/04/21 0305 01/04/21 0403  WBC 22.0*  --  18.5*  --   HGB 13.3   < > 13.1 12.2*  HCT 39.1    < > 36.9* 36.0*  MCV 89.9  --  85.0  --   PLT 150  --  121*  --    < > = values in this interval not displayed.   Basic Metabolic Panel Recent Labs    01/03/21 2029 01/03/21 2202 01/03/21 2327 01/04/21 0305 01/04/21 0403  NA 138   < > 137 136 140  K 2.7*   < > 2.3* 2.6* 2.7*  CL 108   < > 109 109  --   CO2 14*   < > 16* 19*  --   GLUCOSE 427*   < > 413* 295*  --   BUN 21   < > 20 19  --   CREATININE 1.65*   < > 1.69* 1.62*  --   CALCIUM 6.5*   < > 6.6* 6.7*  --   MG 2.5*  --   --  2.2  --    < > = values in this interval not displayed.   Liver Function Tests Recent Labs    01/03/21 1107  AST 73*  ALT 65*  ALKPHOS 27*  BILITOT 0.9  PROT 4.6*  ALBUMIN 2.7*   No results for input(s): LIPASE, AMYLASE in the last 72 hours. Cardiac Enzymes No results for input(s): CKTOTAL, CKMB, CKMBINDEX, TROPONINI  in the last 72 hours.  BNP: BNP (last 3 results) No results for input(s): BNP in the last 8760 hours.  ProBNP (last 3 results) No results for input(s): PROBNP in the last 8760 hours.   D-Dimer No results for input(s): DDIMER in the last 72 hours. Hemoglobin A1C No results for input(s): HGBA1C in the last 72 hours. Fasting Lipid Panel Recent Labs    01/04/21 0412  TRIG 50   Thyroid Function Tests No results for input(s): TSH, T4TOTAL, T3FREE, THYROIDAB in the last 72 hours.  Invalid input(s): FREET3  Other results:   Imaging    CARDIAC CATHETERIZATION  Result Date: 01/03/2021 Findings: On NE and impella RA = 4 RV = 19/3 PA = 20/10 (11) PCW = 7 Fick cardiac output/index = 2.6/1.7 FA sat = 100% PA sat = 54% Assessment: Mix of cardiogenic and hemorrhagic shock in setting of inferior STEMI and L groin bleed at Impella site Plan/Discussion: Continue supportive care. Glori Bickers, MD 12:00 PM   CARDIAC CATHETERIZATION  Result Date: 01/03/2021  Ost LAD to Prox LAD lesion is 50% stenosed.  Mid LAD lesion is 20% stenosed.  Prox RCA-1 lesion is 100%  stenosed.  Prox RCA-2 lesion is 50% stenosed.  A stent was successfully placed.  Post intervention, there is a 0% residual stenosis.  Post intervention, there is a 0% residual stenosis.  Acute inferior wall myocardial infarction secondary to total proximal RCA occlusion complicated by cardiogenic shock, bradycardia, episodes of ventricular fibrillation requiring defibrillation, CPR, and emergency intubation. Emergent temporary pacemaker insertion advanced to the RV apex. Initial left coronary injection revealed flow minimal filling beyond the mid LAD and mid circumflex vessel. Cardiogenic shock requiring emergent Impella CP insertion via the left femoral artery. Relook of the left coronary system after Impella insertion showed brisk TIMI-3 flow with evidence for calcification of the proximal LAD with 50% stenosis and mild 30% mid LAD stenosis, a very large high marginal/ramus distribution vessel extending to the apex and otherwise a normal-appearing left circumflex coronary artery. Total occlusion of the very proximal RCA with TIMI 0 flow Successful emergent PCI to the RCA with ultimate insertion of a 3.5 x 34 mm Resolute Onyx stent postdilated to 3.8 mm with 800% occlusion being reduced to 0% and with restoration of brisk TIMI-3 flow supplying a very large dominant RCA. RECOMMENDATION: The patient following stabilization will be brought to to the heart ICU the advanced heart failure team with  management his Impella, critical care will be involved with ventilator management.  We will transition to Brilinta via NG tube once placed and discontinue the lower.  2D echo Doppler, vigorous hydration with continued hemodynamic support.   Portable Chest x-ray  Result Date: 01/03/2021 CLINICAL DATA:  Hypoxia EXAM: PORTABLE CHEST 1 VIEW COMPARISON:  May 17, 2019 chest radiograph; chest CT June 07, 2019 FINDINGS: Endotracheal tube tip is 5.0 cm above the carina. Nasogastric tube tip and side port are below the  diaphragm. Swan-Ganz catheter present with tip in the proximal right lower lobe pulmonary artery. Impella device present. No pneumothorax. There is a small left pleural effusion with mild left base atelectasis. Lungs elsewhere clear. Heart is borderline enlarged with pulmonary vascularity normal. No adenopathy. No bone lesions. IMPRESSION: Tube and catheter positions as described without evident pneumothorax. Small left pleural effusion with left base atelectasis. Lungs otherwise clear. Heart borderline enlarged. Electronically Signed   By: Lowella Grip III M.D.   On: 01/03/2021 13:05   ECHOCARDIOGRAM LIMITED  Result Date: 01/03/2021  ECHOCARDIOGRAM LIMITED REPORT   Patient Name:   [name] Date of Exam: 01/03/2021 Medical Rec #:  [id number]       Height:       73.0 in Accession #:    [id number]      Weight:       191.8 lb Date of Birth:  08/06/1951      BSA:          2.114 m Patient Age:    70 years        BP:           87/64 mmHg Patient Gender: M               HR:           92 bpm. Exam Location:  Inpatient Procedure: Limited Echo Indications:    CHF-Acute Systolic Q76.22  History:        Patient has no prior history of Echocardiogram examinations.                 Risk Factors:Hypertension and Dyslipidemia. Cancer. CAD with                 Inferior STEMI. Cardiogenic Shock. VF Arrest.  Sonographer:    Darlina Sicilian RDCS Referring Phys: [id number] Lenda Baratta R Annet Manukyan  Sonographer Comments: Impella placement IMPRESSIONS  1. Left ventricular ejection fraction, by estimation, is 25 to 30%. The left ventricle has severely decreased function. Impella catheter noted in LV at 2.35 cm depth.  2. Right ventricular systolic function is moderately reduced. The right ventricular size is normal.  3. Left atrial size was mildly dilated.  4. Limited echo. FINDINGS  Left Ventricle: Left ventricular ejection fraction, by estimation, is 25 to 30%. The left ventricle has severely decreased function. The left ventricular  internal cavity size was normal in size. There is no left ventricular hypertrophy. Right Ventricle: The right ventricular size is normal. Right ventricular systolic function is moderately reduced. Left Atrium: Left atrial size was mildly dilated. Right Atrium: Right atrial size was normal in size. Additional Comments: A venous catheter is visualized in the right ventricle. Loralie Champagne MD Electronically signed by Loralie Champagne MD Signature Date/Time: 01/03/2021/5:48:09 PM    Final       Medications:     Scheduled Medications: . aspirin  81 mg Per Tube Daily  . chlorhexidine gluconate (MEDLINE KIT)  15 mL Mouth Rinse BID  . Chlorhexidine Gluconate Cloth  6 each Topical Daily  . mouth rinse  15 mL Mouth Rinse 10 times per day  . pantoprazole (PROTONIX) IV  40 mg Intravenous Daily  . potassium chloride  40 mEq Per Tube Q4H  . rosuvastatin  10 mg Per Tube Daily  . sodium chloride flush  10-40 mL Intracatheter Q12H  . sodium chloride flush  3 mL Intravenous Q12H  . ticagrelor  90 mg Per Tube BID     Infusions: . sodium chloride    . amiodarone 30 mg/hr (01/04/21 0700)  . ceFEPime (MAXIPIME) IV    . epinephrine 8 mcg/min (01/04/21 0700)  . fentaNYL infusion INTRAVENOUS 100 mcg/hr (01/04/21 0700)  . impella catheter heparin 25 unit/mL in dextrose 5%    . insulin 15 mL/hr at 01/04/21 0700  . midazolam Stopped (01/03/21 1300)  . milrinone Stopped (01/03/21 1912)  . norepinephrine (LEVOPHED) Adult infusion 14 mcg/min (01/04/21 0700)  . potassium chloride 10 mEq (01/04/21 0701)  . propofol (DIPRIVAN) infusion 35 mcg/kg/min (01/04/21 0700)  . sodium bicarbonate  150 mEq in D5W infusion 125 mL/hr at 01/04/21 0700  . vancomycin 150 mL/hr at 01/04/21 0700  . vasopressin 0.03 Units/min (01/04/21 0700)     PRN Medications:  sodium chloride, sodium chloride, acetaminophen, dextrose, ondansetron (ZOFRAN) IV, sodium chloride flush, sodium chloride flush    Assessment/Plan   1. CAD/ Acute  Inferior STEMI - emergent cath w/ pRCA occlusion s/p PCI + DES - Initial angiography showed sluggish flow in LAD and LCX>>improved post impella placement  - Echo LVEF 20-25%, RV moderately reduced - DAPT w/ ASA + Brilinta  - Crestor 10 per tube   2. VF Arrest - in setting of acute MI, now s/p revascularization  - no further VF on tele - aggressively supp K - keep K > 4.0 and Mg > 2.0  - continue IV amio while on dual pressors  - shivering on exam today. Obtain EEG   3. Shock, Combination Cardiogenic + Hemorrhagic  - Acute MI + developed severe hemorrhagic shock due to bleeding at left groin Impella site. Hb 12.6 -> 7.5>>>transfused 4U PRBCs - impella + inotrope support for cardiogenic shock - Hgb now stable at 12 today  - Lactic acid downtrending, 9.7>>5.5   4. Acute Systolic Heart Failure>>Cardiogenic Shock  - ICM Echo LVEF 20-25%, RV moderately reduced - Continue Impella at P-6 - On NE 20 + Epi 5. Co-ox 67% - Follow hemodynamics on swan  - CVP 11. No diuretics for now   5. Hypokalemia - K 2.9, Mg ok at 2.2 - aggressively supp w/ KCl - follow-up BMP at noon - monitor on tele   6. Leukocytosis - WBC 22>>18K  - ? Reactive from acute MI  - continue empiric abx per PCCM     Length of Stay: 1  Brittainy Simmons, PA-C  01/04/2021, 7:58 AM  Advanced Heart Failure Team Pager [phone] (M-F; 7a - 5p)  Please contact [facility] for night-coverage after hours (5p -7a ) and weekends on [email or web address]   Agree. Remains intubated/sedated. Appears to be shivering on exam.   Was severely acidotic overnight. Now improving a bit. Pressors weaned down now on epi 5 (from 20) and NE 20 (from 5) and VP. Lactic acid 9 -> 5.5. Impella stable at p-6  Swan  CVP 11 PA 24/16 Thermo 3.5/1.7 Co-ox 67%   Echo 25-30% with moderate RV dysfunction   General:  Intubated/sedated. + shivering  HEENT: normal + ETT Neck: supple. RIJ swan. Carotids 2+ bilat; no bruits. No lymphadenopathy or  thryomegaly appreciated. Cor: PMI nondisplaced. Regular rate & rhythm. No rubs, gallops or murmurs. + impella hum Lungs: coarse Abdomen: soft, nontender, nondistended. No hepatosplenomegaly. No bruits or masses. Good bowel sounds. Extremities: no cyanosis, clubbing, rash, 2+ edema  L groin with impella (+ ecchymosis/hematoma - stable) Neuro: intubated/sedated  Remains critically ill on multiple pressors with persistent lactic acidosis. Will continue current support. Impella waveforms stable. Will not push diuresis as he is volume sensitive with RV failure. Hopefully we can improve acidosis today. Continue insulin gtt. supp K.   CRITICAL CARE Performed by: Glori Bickers  Total critical care time: 45 minutes  Critical care time was exclusive of separately billable procedures and treating other patients.  Critical care was necessary to treat or prevent imminent or life-threatening deterioration.  Critical care was time spent personally by me (independent of midlevel providers or residents) on the following activities: development of treatment plan with patient and/or surrogate as well as nursing, discussions with consultants, evaluation of patient's response to  treatment, examination of patient, obtaining history from patient or surrogate, ordering and performing treatments and interventions, ordering and review of laboratory studies, ordering and review of radiographic studies, pulse oximetry and re-evaluation of patient's condition.  Glori Bickers, MD  9:49 AM

## 2021-01-05 ENCOUNTER — Inpatient Hospital Stay (HOSPITAL_COMMUNITY): Payer: Medicare PPO

## 2021-01-05 ENCOUNTER — Other Ambulatory Visit: Payer: Self-pay

## 2021-01-05 ENCOUNTER — Encounter (HOSPITAL_COMMUNITY): Payer: Self-pay | Admitting: Internal Medicine

## 2021-01-05 DIAGNOSIS — Z95811 Presence of heart assist device: Secondary | ICD-10-CM | POA: Diagnosis not present

## 2021-01-05 DIAGNOSIS — R570 Cardiogenic shock: Secondary | ICD-10-CM | POA: Diagnosis not present

## 2021-01-05 DIAGNOSIS — I5021 Acute systolic (congestive) heart failure: Secondary | ICD-10-CM | POA: Diagnosis not present

## 2021-01-05 DIAGNOSIS — J9601 Acute respiratory failure with hypoxia: Secondary | ICD-10-CM | POA: Diagnosis not present

## 2021-01-05 DIAGNOSIS — R739 Hyperglycemia, unspecified: Secondary | ICD-10-CM

## 2021-01-05 DIAGNOSIS — Z9911 Dependence on respirator [ventilator] status: Secondary | ICD-10-CM | POA: Diagnosis not present

## 2021-01-05 LAB — POCT I-STAT 7, (LYTES, BLD GAS, ICA,H+H)
Acid-base deficit: 1 mmol/L (ref 0.0–2.0)
Acid-base deficit: 1 mmol/L (ref 0.0–2.0)
Acid-base deficit: 1 mmol/L (ref 0.0–2.0)
Bicarbonate: 22.5 mmol/L (ref 20.0–28.0)
Bicarbonate: 23 mmol/L (ref 20.0–28.0)
Bicarbonate: 23 mmol/L (ref 20.0–28.0)
Calcium, Ion: 0.99 mmol/L — ABNORMAL LOW (ref 1.15–1.40)
Calcium, Ion: 0.98 mmol/L — ABNORMAL LOW (ref 1.15–1.40)
Calcium, Ion: 1.01 mmol/L — ABNORMAL LOW (ref 1.15–1.40)
HCT: 29 % — ABNORMAL LOW (ref 39.0–52.0)
HCT: 29 % — ABNORMAL LOW (ref 39.0–52.0)
HCT: 29 % — ABNORMAL LOW (ref 39.0–52.0)
Hemoglobin: 9.9 g/dL — ABNORMAL LOW (ref 13.0–17.0)
Hemoglobin: 9.9 g/dL — ABNORMAL LOW (ref 13.0–17.0)
Hemoglobin: 9.9 g/dL — ABNORMAL LOW (ref 13.0–17.0)
O2 Saturation: 93 %
O2 Saturation: 92 %
O2 Saturation: 94 %
Patient temperature: 37.6
Patient temperature: 37.5
Patient temperature: 36.7
Potassium: 5.6 mmol/L — ABNORMAL HIGH (ref 3.5–5.1)
Potassium: 5.3 mmol/L — ABNORMAL HIGH (ref 3.5–5.1)
Potassium: 3.7 mmol/L (ref 3.5–5.1)
Sodium: 136 mmol/L (ref 135–145)
Sodium: 137 mmol/L (ref 135–145)
Sodium: 139 mmol/L (ref 135–145)
TCO2: 24 mmol/L (ref 22–32)
TCO2: 24 mmol/L (ref 22–32)
TCO2: 24 mmol/L (ref 22–32)
pCO2 arterial: 34.5 mmHg (ref 32.0–48.0)
pCO2 arterial: 34.4 mmHg (ref 32.0–48.0)
pCO2 arterial: 35.7 mmHg (ref 32.0–48.0)
pH, Arterial: 7.425 (ref 7.350–7.450)
pH, Arterial: 7.434 (ref 7.350–7.450)
pH, Arterial: 7.416 (ref 7.350–7.450)
pO2, Arterial: 66 mmHg — ABNORMAL LOW (ref 83.0–108.0)
pO2, Arterial: 62 mmHg — ABNORMAL LOW (ref 83.0–108.0)
pO2, Arterial: 66 mmHg — ABNORMAL LOW (ref 83.0–108.0)

## 2021-01-05 LAB — BPAM RBC
Blood Product Expiration Date: 202205152359
Blood Product Expiration Date: 202205152359
Blood Product Expiration Date: 202205152359
Blood Product Expiration Date: 202205152359
ISSUE DATE / TIME: 202204141041
ISSUE DATE / TIME: 202204141041
ISSUE DATE / TIME: 202204141118
ISSUE DATE / TIME: 202204141118
Unit Type and Rh: 5100
Unit Type and Rh: 5100
Unit Type and Rh: 5100
Unit Type and Rh: 5100

## 2021-01-05 LAB — TYPE AND SCREEN
ABO/RH(D): AB POS
Antibody Screen: NEGATIVE
Unit division: 0
Unit division: 0
Unit division: 0
Unit division: 0

## 2021-01-05 LAB — LACTATE DEHYDROGENASE: LDH: 339 U/L — ABNORMAL HIGH (ref 98–192)

## 2021-01-05 LAB — CBC
HCT: 31.6 % — ABNORMAL LOW (ref 39.0–52.0)
Hemoglobin: 11 g/dL — ABNORMAL LOW (ref 13.0–17.0)
MCH: 30.6 pg (ref 26.0–34.0)
MCHC: 34.8 g/dL (ref 30.0–36.0)
MCV: 87.8 fL (ref 80.0–100.0)
Platelets: 70 10*3/uL — ABNORMAL LOW (ref 150–400)
RBC: 3.6 MIL/uL — ABNORMAL LOW (ref 4.22–5.81)
RDW: 15.1 % (ref 11.5–15.5)
WBC: 23.3 10*3/uL — ABNORMAL HIGH (ref 4.0–10.5)
nRBC: 0 % (ref 0.0–0.2)

## 2021-01-05 LAB — BASIC METABOLIC PANEL
Anion gap: 6 (ref 5–15)
Anion gap: 6 (ref 5–15)
BUN: 22 mg/dL (ref 8–23)
BUN: 28 mg/dL — ABNORMAL HIGH (ref 8–23)
CO2: 23 mmol/L (ref 22–32)
CO2: 24 mmol/L (ref 22–32)
Calcium: 6.7 mg/dL — ABNORMAL LOW (ref 8.9–10.3)
Calcium: 6.8 mg/dL — ABNORMAL LOW (ref 8.9–10.3)
Chloride: 108 mmol/L (ref 98–111)
Chloride: 109 mmol/L (ref 98–111)
Creatinine, Ser: 1.45 mg/dL — ABNORMAL HIGH (ref 0.61–1.24)
Creatinine, Ser: 1.67 mg/dL — ABNORMAL HIGH (ref 0.61–1.24)
GFR, Estimated: 52 mL/min — ABNORMAL LOW (ref >60)
GFR, Estimated: 44 mL/min — ABNORMAL LOW (ref >60)
Glucose, Bld: 154 mg/dL — ABNORMAL HIGH (ref 70–99)
Glucose, Bld: 126 mg/dL — ABNORMAL HIGH (ref 70–99)
Potassium: 5.4 mmol/L — ABNORMAL HIGH (ref 3.5–5.1)
Potassium: 3.7 mmol/L (ref 3.5–5.1)
Sodium: 137 mmol/L (ref 135–145)
Sodium: 139 mmol/L (ref 135–145)

## 2021-01-05 LAB — COMPREHENSIVE METABOLIC PANEL
ALT: 35 U/L (ref 0–44)
AST: 38 U/L (ref 15–41)
Albumin: 2.1 g/dL — ABNORMAL LOW (ref 3.5–5.0)
Alkaline Phosphatase: 34 U/L — ABNORMAL LOW (ref 38–126)
Anion gap: 7 (ref 5–15)
BUN: 31 mg/dL — ABNORMAL HIGH (ref 8–23)
CO2: 23 mmol/L (ref 22–32)
Calcium: 6.7 mg/dL — ABNORMAL LOW (ref 8.9–10.3)
Chloride: 107 mmol/L (ref 98–111)
Creatinine, Ser: 1.85 mg/dL — ABNORMAL HIGH (ref 0.61–1.24)
GFR, Estimated: 39 mL/min — ABNORMAL LOW (ref >60)
Glucose, Bld: 117 mg/dL — ABNORMAL HIGH (ref 70–99)
Potassium: 3.6 mmol/L (ref 3.5–5.1)
Sodium: 137 mmol/L (ref 135–145)
Total Bilirubin: 0.6 mg/dL (ref 0.3–1.2)
Total Protein: 4.4 g/dL — ABNORMAL LOW (ref 6.5–8.1)

## 2021-01-05 LAB — GLUCOSE, CAPILLARY
Glucose-Capillary: 120 mg/dL — ABNORMAL HIGH (ref 70–99)
Glucose-Capillary: 128 mg/dL — ABNORMAL HIGH (ref 70–99)
Glucose-Capillary: 138 mg/dL — ABNORMAL HIGH (ref 70–99)
Glucose-Capillary: 153 mg/dL — ABNORMAL HIGH (ref 70–99)
Glucose-Capillary: 157 mg/dL — ABNORMAL HIGH (ref 70–99)
Glucose-Capillary: 158 mg/dL — ABNORMAL HIGH (ref 70–99)

## 2021-01-05 LAB — COOXEMETRY PANEL
Carboxyhemoglobin: 0.5 % (ref 0.5–1.5)
Methemoglobin: 1.1 % (ref 0.0–1.5)
O2 Saturation: 53.4 %
Total hemoglobin: 11.3 g/dL — ABNORMAL LOW (ref 12.0–16.0)

## 2021-01-05 LAB — TRIGLYCERIDES: Triglycerides: 65 mg/dL (ref <150)

## 2021-01-05 LAB — HEPARIN LEVEL (UNFRACTIONATED): Heparin Unfractionated: <0.1 IU/mL — ABNORMAL LOW (ref 0.30–0.70)

## 2021-01-05 LAB — MAGNESIUM
Magnesium: 2 mg/dL (ref 1.7–2.4)
Magnesium: 2 mg/dL (ref 1.7–2.4)
Magnesium: 2.1 mg/dL (ref 1.7–2.4)

## 2021-01-05 LAB — PHOSPHORUS
Phosphorus: 4 mg/dL (ref 2.5–4.6)
Phosphorus: 5.6 mg/dL — ABNORMAL HIGH (ref 2.5–4.6)

## 2021-01-05 MED ORDER — FUROSEMIDE 10 MG/ML IJ SOLN
80.0000 mg | Freq: Once | INTRAMUSCULAR | Status: AC
  Administered 2021-01-05: 80 mg via INTRAVENOUS
  Filled 2021-01-05: qty 8

## 2021-01-05 MED ORDER — PANTOPRAZOLE SODIUM 40 MG PO PACK
40.0000 mg | PACK | Freq: Every day | ORAL | Status: DC
  Administered 2021-01-05 – 2021-01-13 (×9): 40 mg
  Filled 2021-01-05 (×10): qty 20

## 2021-01-05 MED ORDER — POTASSIUM CHLORIDE 20 MEQ PO PACK
40.0000 meq | PACK | Freq: Once | ORAL | Status: AC
  Administered 2021-01-05: 40 meq
  Filled 2021-01-05: qty 2

## 2021-01-05 MED ORDER — SORBITOL 70 % SOLN
30.0000 mL | Freq: Once | Status: AC
  Administered 2021-01-05: 30 mL
  Filled 2021-01-05: qty 30

## 2021-01-05 MED ORDER — METOCLOPRAMIDE HCL 5 MG/ML IJ SOLN
10.0000 mg | Freq: Four times a day (QID) | INTRAMUSCULAR | Status: DC
  Administered 2021-01-05 – 2021-01-06 (×4): 10 mg via INTRAVENOUS
  Filled 2021-01-05 (×4): qty 2

## 2021-01-05 MED ORDER — EPINEPHRINE HCL-DEXTROSE 8-5 MG/250ML-% IV SOLN: 0.5000 ug/min | INTRAVENOUS | Status: DC

## 2021-01-05 MED ORDER — ORAL CARE MOUTH RINSE
15.0000 mL | OROMUCOSAL | Status: DC
  Administered 2021-01-05 – 2021-01-15 (×101): 15 mL via OROMUCOSAL

## 2021-01-05 MED ORDER — VANCOMYCIN HCL 1000 MG/200ML IV SOLN
1000.0000 mg | INTRAVENOUS | Status: DC
  Administered 2021-01-06: 1000 mg via INTRAVENOUS
  Filled 2021-01-05: qty 200

## 2021-01-05 MED ORDER — SODIUM CHLORIDE 0.9 % IV SOLN
2.0000 g | Freq: Two times a day (BID) | INTRAVENOUS | Status: DC
  Administered 2021-01-06: 2 g via INTRAVENOUS
  Filled 2021-01-05: qty 2

## 2021-01-05 MED ORDER — CHLORHEXIDINE GLUCONATE 0.12% ORAL RINSE (MEDLINE KIT)
15.0000 mL | Freq: Two times a day (BID) | OROMUCOSAL | Status: DC
  Administered 2021-01-05 – 2021-01-15 (×19): 15 mL via OROMUCOSAL

## 2021-01-05 MED ORDER — FUROSEMIDE 10 MG/ML IJ SOLN
6.0000 mg/h | INTRAVENOUS | Status: DC
  Administered 2021-01-05: 8 mg/h via INTRAVENOUS
  Administered 2021-01-06: 6 mg/h via INTRAVENOUS
  Administered 2021-01-07 – 2021-01-09 (×4): 12 mg/h via INTRAVENOUS
  Filled 2021-01-05 (×8): qty 20

## 2021-01-05 MED ORDER — DEXTROSE 5 % IV SOLN
0.5000 ug/min | INTRAVENOUS | Status: AC
  Administered 2021-01-05: 10 ug/min via INTRAVENOUS
  Administered 2021-01-06 – 2021-01-07 (×4): 14 ug/min via INTRAVENOUS
  Filled 2021-01-05 (×4): qty 8
  Filled 2021-01-05: qty 3

## 2021-01-05 MED ORDER — POTASSIUM CHLORIDE 20 MEQ PO PACK
20.0000 meq | PACK | Freq: Once | ORAL | Status: AC
  Administered 2021-01-05: 20 meq
  Filled 2021-01-05: qty 1

## 2021-01-05 MED ORDER — POLYETHYLENE GLYCOL 3350 17 G PO PACK
17.0000 g | PACK | Freq: Two times a day (BID) | ORAL | Status: DC
  Administered 2021-01-05 – 2021-01-21 (×20): 17 g
  Filled 2021-01-05 (×23): qty 1

## 2021-01-05 NOTE — Progress Notes (Signed)
NAME:  [name], MRN:  [id number], DOB:  23-Oct-1950, LOS: 2 ADMISSION DATE:  01/03/2021, CONSULTATION DATE:  01/03/2021 REFERRING MD:  Dr. Haroldine Laws, CHIEF COMPLAINT:  STEMI/ cardiogenic shock  History of Present Illness:  HPI obtained from medical chart review as patient is currently intubated and sedated on mechanical ventilation.  70 year old male with prior history of hypertension, hyperlipidemia, and CAD presenting by EMS with sudden onset of chest pain found to have inferior STEMI with bradycardia and hypotension.  Taken emergently to Cath Lab on arrival.  A temporary pacer was placed with good capture, but then developed profound hypotension decompensating to cardiac arrest, requiring intubation by anesthesia, CPR, episodes of V. fib requiring defibrillation and amiodarone.  Found to have total proximal RCA occlusion and underwent PCI to the RCA with stent placement.  Procedure further complicated by cardiogenic shock requiring placement of Impella device complicated by bleeding secondary to cangrelor with development of bleeding and hematoma at puncture sites, Hgb 12.6-> 7.5,  requiring emergent transfusion of 4 units PRBC and stopping of cangrelor with stabilization of hemodynamics but remains on NE, epi, and vasopressin drips.  Patient returns to ICU intubated and sedated in cardiogenic shock with TVP and impella.  Unable to place aline in cath lab secondary to bleeding.  Labs pending.  PCCM consulted for vent management.   Pertinent  Medical History  HTN, HLD, CAD  Significant Hospital Events: Including procedures, antibiotic start and stop dates in addition to other pertinent events   . 4/14 admitted Cards with inferior STEMI-> bradycardia s/p TVP, cardiogenic shock and cardiac arrest s/p impella with PCI/stent to RCA, complicated by bleeding s/p 4 units PRBC thought 2/2 cangrelor, pending coags.  Has R IJ PA cath, TVP R groin, impella L groin . 4/15> remains on impella, weaning  pressors and inotropes. Started diuresis due to significantly increased oxygen requirements.  Interim History / Subjective:  Hyperkalemia overnight, responded to lasix, but still net positive. Impella P-7. CVP  19 PA 31/20 (24) PCWP 18 CO 3.55  CI 1.68  Objective   Blood pressure 97/80, pulse 100, temperature 99.14 F (37.3 C), temperature source Core, resp. rate 18, weight 108.9 kg, SpO2 99 %. PAP: (24-44)/(14-29) 28/21 CVP:  [10 mmHg-22 mmHg] 19 mmHg CO:  [3.5 L/min-4.7 L/min] 3.8 L/min CI:  [1.7 L/min/m2-2.2 L/min/m2] 1.7 L/min/m2  Vent Mode: PRVC FiO2 (%):  [75 %-100 %] 100 % Set Rate:  [18 bmp-22 bmp] 18 bmp Vt Set:  [640 mL] 640 mL PEEP:  [5 QPY19-50 cmH20] 12 cmH20 Plateau Pressure:  [20 cmH20-23 cmH20] 21 cmH20   Intake/Output Summary (Last 24 hours) at 01/05/2021 0817 Last data filed at 01/05/2021 0802 Gross per 24 hour  Intake 6818.35 ml  Output 2480 ml  Net 4338.35 ml   Filed Weights   01/04/21 0500 01/05/21 0500  Weight: 104.7 kg 108.9 kg    Examination: General: critically ill appearing man laying in bed intubated, sedated HEENT: [place]/AT, eyes anicteric, breathing synchronously with MV. Neuro: RASS -5, not breathing over vent, intact tracheal cough reflex. PERRL CV: S1S2, paced rhythm PULM:  Reduced basilar breath sounds, rhales, not breathing over the vent. No significant secretions from ETT GI: soft, NT, ND Extremities: ++edema, hematoma around impella stable, TVP in R groin Skin: pallor, bruising around RIJ CVC, on left shoulder. GU: hematoma from groin expanded into scrotum, foley in palce  Labs/imaging that I have personally reviewed  (right click and "Reselect all SmartList Selections" daily)  4/14 LHC >>  Colon Flattery  LAD to Prox LAD lesion is 50% stenosed.  Mid LAD lesion is 20% stenosed.  Prox RCA-1 lesion is 100% stenosed.  Prox RCA-2 lesion is 50% stenosed.  A stent was successfully placed.  Post intervention, there is a 0% residual  stenosis.  Post intervention, there is a 0% residual stenosis.  LA 5.5, downtrending coox 67% BUN 19 Cr. 1.44 K+ 3.0  CXR personally reviewed> ET tube at the level of the clavicles.  Pulmonary edema, LLL opacity  Resolved Hospital Problem list   Hemorrhagic shock  Assessment & Plan:   Inferior STEMI s/p PCI/ stent to main dominant RCA requiring mechanical and inotropic support Cardiogenic shock due to ischemic cardiomyopathy Vfib cardiac arrest  Bradycardia s/p TVP P:  -Inotropic & vasopressor support per cardiology-- currently on epi, norepi, vasopressin.  Increasing epinephrine today for cardiac support.  -Impella management per Cardiology- currently P-7 -con't DAPT with ASA & brillinta -amiodarone infusion -Continue broad empiric antibiotics for multiple devices  Acute hypoxic respiratory failure, acute cardiogenic pulmonary edema P:  -Con't LTVV, 4-8cc/kg IBW with goal Pplat <30 and DP<15. Meeting goals. Titrate PEEP & FiO2 per ARDS ladder, unable to prone due to vascular devices.  -Titrate down FiO2 as able to maintain SPO2 greater than 90%. -VAP prevention protocol -PAD protocol for sedation. --too early for vent weaning; SAT & SBT when appropriate  Hyperglycemia- now controlled -con't basal bolus insulin -tube feed coverage Q4h with hold parameters -SSI PRN -Goal BG 140-180  Hemorrhagic shock> resolved, hematoma around Impella insertion site due to cangrelor infusion requiring during LHC. Hematoma and H/H stable. Acute blood loss anemia Acute thrombocytopenia- multifactorial from bleeding, impella, critical illness, antibiotics - s/p emergent 4 units PRBC at 4/14 - cont' to monitor closely for bleeding - serial CBCs -Transfuse for hemoglobin less than 7 or hemodynamically significant bleeding -con't to monitor platelets; no intervention currently required  AKI-stable -Renally dose meds, avoid nephrotoxic meds -Strict I/os. Diuresing today. -Retain adequate  perfusion for kidneys- increasing epinephrine for cardiac support.  Hypokalemia> now hyperkalemia Hypocalcemia -Continue to monitor -Diuresis -BMP this afternoon  Acute metabolic acidosis, lactic acidosis due to multifactorial shock -Continue to monitor -d/c bicarb gtt  H/o HTN; currently in shock P:  - hold home meds while in shock  HLD -con't crestor  Leukocytosis, fever yesterday; persistently elevated P:  - likely reactive, but continue empiric antibiotics  At risk for malnutrition -Con't trickle feeds while still in significant cardiogenic shock.  Best practice (right click and "Reselect all SmartList Selections" daily)  Diet:  Tube Feed  Pain/Anxiety/Delirium protocol (if indicated): Yes (RASS goal -3,-4) VAP protocol (if indicated): Yes DVT prophylaxis: Systemic AC GI prophylaxis: PPI Glucose control:  SSI Yes and Basal insulin Yes Central venous access:  Yes, and it is still needed Arterial line:  Yes, and it is still needed Foley:  Yes, and it is still needed Mobility:  bed rest  PT consulted: N/A Last date of multidisciplinary goals of care discussion [4/14 per cardiology ] Code Status:  full code Disposition: 2H ICU   Labs   CBC: Recent Labs  Lab 01/03/21 1251 01/03/21 1446 01/03/21 1809 01/03/21 1812 01/04/21 0305 01/04/21 0403 01/04/21 1938 01/04/21 1944 01/04/21 2205 01/05/21 0202 01/05/21 0400 01/05/21 0504  WBC 24.0*  --  22.0*  --  18.5*  --  22.9*  --   --   --  23.3*  --   HGB 15.0   < > 13.3   < > 13.1   < >  11.1* 9.9* 10.2* 9.9* 11.0* 9.9*  HCT 43.8   < > 39.1   < > 36.9*   < > 31.2* 29.0* 30.0* 29.0* 31.6* 29.0*  MCV 89.0  --  89.9  --  85.0  --  86.7  --   --   --  87.8  --   PLT 165  --  150  --  121*  --  81*  --   --   --  70*  --    < > = values in this interval not displayed.    Basic Metabolic Panel: Recent Labs  Lab 01/03/21 1251 01/03/21 1446 01/03/21 2029 01/03/21 2202 01/04/21 0305 01/04/21 0403 01/04/21 0751  01/04/21 1109 01/04/21 1812 01/04/21 1848 01/04/21 1938 01/04/21 1944 01/04/21 1949 01/04/21 2205 01/05/21 0202 01/05/21 0400 01/05/21 0504  NA  --    < > 138   < > 136   < > 139 139   < > 138 136 136  --  137 136 137 137  K  --    < > 2.7*   < > 2.6*   < > 3.0* 4.0   < > 6.3* 6.1* 6.1*  --  5.8* 5.6* 5.4* 5.3*  CL  --    < > 108   < > 109  --  112* 113*  --  110 109  --   --   --   --  108  --   CO2  --    < > 14*   < > 19*  --  22 23  --  24 23  --   --   --   --  23  --   GLUCOSE  --    < > 427*   < > 295*  --  165* 89  --  163* 162*  --   --   --   --  154*  --   BUN  --    < > 21   < > 19  --  19 19  --  20 20  --   --   --   --  22  --   CREATININE  --    < > 1.65*   < > 1.62*  --  1.44* 1.34*  --  1.41* 1.41*  --   --   --   --  1.45*  --   CALCIUM  --    < > 6.5*   < > 6.7*  --  6.5* 6.6*  --  6.2* 6.2*  --   --   --   --  6.7*  --   MG 1.5*  --  2.5*  --  2.2  --   --  2.0  --   --   --   --   --   --   --  2.0  --   PHOS  --   --   --   --   --   --   --  1.7*  --   --   --   --  3.0  --   --  4.0  --    < > = values in this interval not displayed.    This patient is critically ill with multiple organ system failure which requires frequent high complexity decision making, assessment, support, evaluation, and titration of therapies. This was completed through the application of advanced monitoring technologies and extensive interpretation of multiple  databases. During this encounter critical care time was devoted to patient care services described in this note for 45 minutes.   Julian Hy, DO 01/05/21 10:24 AM [facility]

## 2021-01-05 NOTE — Progress Notes (Signed)
[place] Progress Note Patient Name: Caileb Rhue DOB: 04/26/1951 MRN: [id number]   Date of Service  01/05/2021  HPI/Events of Note  Bedside called to report that CXR, and ABG results are available for review.  eICU Interventions  CXR and ABG reviewed, FIO2 will need to be slowly weaned as lung function improves with diuresis.        Kerry Kass Plato [place] 01/05/2021, 6:19 AM

## 2021-01-05 NOTE — Progress Notes (Signed)
Advanced Heart Failure Rounding Note  PCP-Cardiologist: No primary care provider on file.   Subjective:    Remains intubated and sedated. FiO2 up to 100%   Impella at P-7. Flow 3.1 Waveforms ok  On NE 8, VP 0.03, EPI 2 + bicarb gtt. On cefepime/vanc  Co-ox 53%.  Lactic acid downtrending, 9.7>>5.5 > 2.3 (yesterday) I/O + 4.3L  Weight up 10 pounds overnight. Received IV lasix yesterday  CXR wet with pulmonary effusions  Swan:  CVP 19 PA  31/24 (27) PCWP  18 Thermo 3.6/1.7 SVR 1396 PVR 2.9  PAPI 0.37   Objective:   Weight Range: 108.9 kg Body mass index is 31.67 kg/m.   Vital Signs:   Temp:  [98.42 F (36.9 C)-100.4 F (38 C)] 99.14 F (37.3 C) (04/16 0800) Pulse Rate:  [99-102] 100 (04/16 0800) Resp:  [13-26] 18 (04/16 0800) BP: (83-131)/(72-92) 97/80 (04/16 0800) SpO2:  [88 %-100 %] 99 % (04/16 0800) Arterial Line BP: (84-118)/(53-79) 95/72 (04/16 0800) FiO2 (%):  [75 %-100 %] 100 % (04/16 0800) Weight:  [108.9 kg] 108.9 kg (04/16 0500) Last BM Date:  (PTA)  Weight change: Filed Weights   01/04/21 0500 01/05/21 0500  Weight: 104.7 kg 108.9 kg    Intake/Output:   Intake/Output Summary (Last 24 hours) at 01/05/2021 0811 Last data filed at 01/05/2021 0802 Gross per 24 hour  Intake 6804.75 ml  Output 2480 ml  Net 4324.75 ml      Physical Exam    General:  Intubated/sedated HEENT: normal + ETT Neck: supple. RIJ swan . Carotids 2+ bilat; no bruits. No lymphadenopathy or thryomegaly appreciated. Cor: PMI nondisplaced. Regular rate & rhythm. No rubs, gallops or murmurs. Lungs: clear Abdomen: soft, nontender, + distended. No hepatosplenomegaly. No bruits or masses. Hypoactive bowel sounds. Extremities: no cyanosis, clubbing, rash, 2+ edema Neuro: sedated  Telemetry   Sinus 80s Personally reviewed (I turned down pacer)  Labs    CBC Recent Labs    01/04/21 1938 01/04/21 1944 01/05/21 0400 01/05/21 0504  WBC 22.9*  --  23.3*  --   HGB  11.1*   < > 11.0* 9.9*  HCT 31.2*   < > 31.6* 29.0*  MCV 86.7  --  87.8  --   PLT 81*  --  70*  --    < > = values in this interval not displayed.   Basic Metabolic Panel Recent Labs    01/04/21 1109 01/04/21 1812 01/04/21 1938 01/04/21 1944 01/04/21 1949 01/04/21 2205 01/05/21 0400 01/05/21 0504  NA 139   < > 136   < >  --    < > 137 137  K 4.0   < > 6.1*   < >  --    < > 5.4* 5.3*  CL 113*   < > 109  --   --   --  108  --   CO2 23   < > 23  --   --   --  23  --   GLUCOSE 89   < > 162*  --   --   --  154*  --   BUN 19   < > 20  --   --   --  22  --   CREATININE 1.34*   < > 1.41*  --   --   --  1.45*  --   CALCIUM 6.6*   < > 6.2*  --   --   --  6.7*  --  MG 2.0  --   --   --   --   --  2.0  --   PHOS 1.7*  --   --   --  3.0  --  4.0  --    < > = values in this interval not displayed.   Liver Function Tests Recent Labs    01/03/21 1107  AST 73*  ALT 65*  ALKPHOS 27*  BILITOT 0.9  PROT 4.6*  ALBUMIN 2.7*   No results for input(s): LIPASE, AMYLASE in the last 72 hours. Cardiac Enzymes No results for input(s): CKTOTAL, CKMB, CKMBINDEX, TROPONINI in the last 72 hours.  BNP: BNP (last 3 results) No results for input(s): BNP in the last 8760 hours.  ProBNP (last 3 results) No results for input(s): PROBNP in the last 8760 hours.   D-Dimer No results for input(s): DDIMER in the last 72 hours. Hemoglobin A1C No results for input(s): HGBA1C in the last 72 hours. Fasting Lipid Panel Recent Labs    01/05/21 0400  TRIG 65   Thyroid Function Tests No results for input(s): TSH, T4TOTAL, T3FREE, THYROIDAB in the last 72 hours.  Invalid input(s): FREET3  Other results:   Imaging    DG CHEST PORT 1 VIEW  Result Date: 01/05/2021 CLINICAL DATA:  Hypoxia.  Hypertension. EXAM: PORTABLE CHEST 1 VIEW COMPARISON:  Chest x-rays dated 01/04/2021 and 01/03/2021. FINDINGS: Endotracheal tube appears adequately positioned with tip approximately 5 cm above the carina.  Swan-Ganz catheter in place with tip just to the RIGHT of midline. Enteric tube passes below the diaphragm. Hazy opacities of bilateral lung bases, likely mild atelectasis and/or small pleural effusions. Lungs otherwise clear. No pneumothorax is seen. Heart size and mediastinal contours are stable. IMPRESSION: 1. Support apparatus appears stable in position. 2. Hazy opacities of bilateral lung bases, likely mild atelectasis and/or small pleural effusions. Electronically Signed   By: Franki Cabot M.D.   On: 01/05/2021 08:06   DG CHEST PORT 1 VIEW  Result Date: 01/04/2021 CLINICAL DATA:  Respiratory failure EXAM: PORTABLE CHEST 1 VIEW COMPARISON:  01/03/2021 FINDINGS: Endotracheal tube is seen 6.1 cm above the carina. Right internal jugular Swan-Ganz catheter tip has been slightly withdrawn, but still likely resides within the right inter lobar pulmonary artery. Nasogastric tube extends into the mid body of the stomach. Left ventricular assist device is in expected position overlying the expected left ventricular outflow tract and ascending aorta. Inferiorly approaching trans venous pacemaker lead overlies expected right ventricle. Lung volumes are small, but are stable. There is increasing retrocardiac opacification in keeping with left lower lobe collapse. No pneumothorax. Tiny left pleural effusion is suspected. Cardiac size is mildly enlarged. Pulmonary vascularity is normal. IMPRESSION: Essentially stable support lines and tubes. Right internal jugular Swan-Ganz catheter tipl is likely within the inter lobar pulmonary artery and withdrawal by 3.5-4 cm may better position the catheter. Progressive left lower lobe collapse. Suspected small associated left pleural effusion. Electronically Signed   By: Fidela Salisbury MD   On: 01/04/2021 08:24   EEG adult  Result Date: 01/04/2021 Lora Havens, MD     01/04/2021 12:25 PM Patient Name: [name] MRN: [id number] Epilepsy Attending: Lora Havens  Referring Physician/Provider: Dr. Noemi Chapel Date: 01/04/2021 Duration: 23.37 mins Patient history: 70 year old male with cardiac arrest, now with seizure-like episodes.  EEG to evaluate for seizures. Level of alertness: Lethargic/comatose AEDs during EEG study: Propofol Technical aspects: This EEG study was done with scalp electrodes positioned according to the  10-20 International system of electrode placement. Electrical activity was acquired at a sampling rate of 500Hz  and reviewed with a high frequency filter of 70Hz  and a low frequency filter of 1Hz . EEG data were recorded continuously and digitally stored. Description: EEG showed continuous generalized 3 to 5 Hz polymorphic sharply contoured theta-delta slowing.  Patient was noted to have shivering episodes during the study.  Concomitant EEG before, during and after the study did not show any EEG change to suggest seizure.  Hyperventilation and photic stimulation were not performed.   ABNORMALITY -Continuous slow, generalized IMPRESSION: This study is suggestive of severe diffuse encephalopathy, nonspecific etiology.  Patient was noted of shivering-like episodes during the study without concomitant EEG change and were most likely not epileptic. No seizures or definite epileptiform discharges were seen throughout the recording. Priyanka Barbra Sarks     Medications:     Scheduled Medications: . aspirin  81 mg Per Tube Daily  . chlorhexidine gluconate (MEDLINE KIT)  15 mL Mouth Rinse BID  . Chlorhexidine Gluconate Cloth  6 each Topical Daily  . feeding supplement (PROSource TF)  45 mL Per Tube TID  . feeding supplement (VITAL 1.5 CAL)  1,000 mL Per Tube Q24H  . insulin aspart  2 Units Subcutaneous Q4H  . insulin aspart  2-6 Units Subcutaneous Q4H  . insulin detemir  30 Units Subcutaneous Daily  . mouth rinse  15 mL Mouth Rinse 10 times per day  . pantoprazole (PROTONIX) IV  40 mg Intravenous Daily  . rosuvastatin  10 mg Per Tube Daily  . sodium  chloride flush  10-40 mL Intracatheter Q12H  . sodium chloride flush  3 mL Intravenous Q12H  . ticagrelor  90 mg Per Tube BID    Infusions: . sodium chloride    . amiodarone 30 mg/hr (01/05/21 0801)  . ceFEPime (MAXIPIME) IV Stopped (01/05/21 0538)  . dextrose    . epinephrine 2 mcg/min (01/05/21 0802)  . fentaNYL infusion INTRAVENOUS 100 mcg/hr (01/05/21 0802)  . impella catheter heparin 25 unit/mL in dextrose 5%    . midazolam Stopped (01/03/21 1300)  . milrinone Stopped (01/03/21 1912)  . norepinephrine (LEVOPHED) Adult infusion 8 mcg/min (01/05/21 0802)  . propofol (DIPRIVAN) infusion 35 mcg/kg/min (01/05/21 0802)  . sodium bicarbonate 150 mEq in D5W infusion 50 mL/hr at 01/05/21 0700  . vancomycin 150 mL/hr at 01/05/21 0700  . vasopressin 0.03 Units/min (01/05/21 0700)    PRN Medications: sodium chloride, sodium chloride, acetaminophen (TYLENOL) oral liquid 160 mg/5 mL, dextrose, ondansetron (ZOFRAN) IV, sodium chloride flush, sodium chloride flush    Assessment/Plan   1. CAD/ Acute Inferior STEMI - emergent cath w/ pRCA occlusion s/p PCI + DES - Initial angiography showed sluggish flow in LAD and LCX>>improved post impella placement  - Echo LVEF 20-25%, RV moderately reduced - DAPT w/ ASA + Brilinta  - Crestor 10 per tube   2. VF Arrest - in setting of acute MI, now s/p revascularization  - no further ectopy on tele - keep K > 4.0 and Mg > 2.0  - can stop amio for now to help reduce volume load - EEG with diffuse encephalopathy but no seizure activity  3. Shock, Combination Cardiogenic + Hemorrhagic  - Acute MI + developed severe hemorrhagic shock due to bleeding at left groin Impella Transfused 4U PRBCs . hgb now stable - impella + inotrope support for cardiogenic shock - Swan numbers done personally. Hemodynamics most c/w RV failure (in setting of Impella support). Increase epi.  Can stop vasopressin. Diurese  4. Acute Systolic Heart Failure>>Cardiogenic Shock   - ICM Echo LVEF 20-25%, RV moderately reduced - Continue Impella at P-7 - Increase epi for RV support - Diurese  5. Acute hypoxic respiratory failure - now on 100% FIO2 - diurese aggressively - d/w CCM at bedside  6. Hypokalemia/hyperkalemia - K 2.9 -> 5.4, Mg ok at 2.2  7. Leukocytosis - WBC 23K - ? Reactive from acute MI  - continue empiric abx  8. DM2 - off insulin gtt - now on levimir and SSI  CRITICAL CARE Performed by: Glori Bickers  Total critical care time: 60 minutes  Critical care time was exclusive of separately billable procedures and treating other patients.  Critical care was necessary to treat or prevent imminent or life-threatening deterioration.  Critical care was time spent personally by me (independent of midlevel providers or residents) on the following activities: development of treatment plan with patient and/or surrogate as well as nursing, discussions with consultants, evaluation of patient's response to treatment, examination of patient, obtaining history from patient or surrogate, ordering and performing treatments and interventions, ordering and review of laboratory studies, ordering and review of radiographic studies, pulse oximetry and re-evaluation of patient's condition.    Length of Stay: 2  Glori Bickers, MD  01/05/2021, 8:11 AM  Advanced Heart Failure Team Pager [phone] (M-F; 7a - 5p)  Please contact [facility] for night-coverage after hours (5p -7a ) and weekends on [email or web address]   Glori Bickers, MD  8:11 AM

## 2021-01-05 NOTE — Progress Notes (Signed)
Pharmacy Antibiotic Note  [name] is a 70 y.o. male admitted on 01/03/2021 with sepsis.  Pharmacy has been consulted for vancomycin and cefepime dosing.  Renal function worsening, now ClCr ~ 30ml/min. WBC still 23. Last fever 4/15 at 100.4. Doses of both antibiotics given within last 4 hours.   4/16 Vancomycin 1000mg  Q 24 hr Scr used: 1.85 mg/dL Weight: 108.9 kg Vd coeff: 0.5 L/kg Est AUC: 468  Plan: Decrease cefepime to 2g Q 12 hr Decrease vancomycin to 1g q24hr   Monitor cultures, clinical status, renal fx, vanc level  Narrow abx as able and f/u duration    Height: 6\' 1"  (185.4 cm) Weight: 108.9 kg (240 lb 1.3 oz) IBW/kg (Calculated) : 79.9  Temp (24hrs), Avg:98.7 F (37.1 C), Min:98.06 F (36.7 C), Max:100.22 F (37.9 C)  Recent Labs  Lab 01/03/21 1251 01/03/21 1440 01/03/21 1809 01/03/21 2029 01/03/21 2157 01/03/21 2305 01/04/21 0305 01/04/21 0455 01/04/21 0751 01/04/21 1109 01/04/21 1848 01/04/21 1938 01/05/21 0400 01/05/21 1600 01/05/21 2028  WBC 24.0*  --  22.0*  --   --   --  18.5*  --   --   --   --  22.9* 23.3*  --   --   CREATININE  --   --  1.58*   < >  --    < > 1.62*  --    < > 1.34* 1.41* 1.41* 1.45* 1.67* 1.85*  LATICACIDVEN  --  6.0* 9.2*  --  9.7*  --   --  5.5*  --  2.3*  --   --   --   --   --    < > = values in this interval not displayed.    Estimated Creatinine Clearance: 48.1 mL/min (A) (by C-G formula based on SCr of 1.85 mg/dL (H)).    Allergies  Allergen Reactions  . Codeine Other (See Comments)    hallucinations  . Lisinopril Cough    Antimicrobials this admission: Vanc 4/14 >>  Cefe 4/15 >>    Microbiology results: 4/14 Covid/flu neg  4/14 MRSA PCR: neg  Thank you for allowing pharmacy to be a part of this patient's care.  Benetta Spar, PharmD, BCPS, BCCP Clinical Pharmacist  Please check AMION for all [place] phone numbers After 10:00 PM, call [place] [phone]

## 2021-01-05 NOTE — Progress Notes (Signed)
ANTICOAGULATION CONSULT NOTE - Follow Up Consult  Pharmacy Consult for heparin Indication: Impella   Allergies  Allergen Reactions  . Codeine Other (See Comments)    hallucinations  . Lisinopril Cough    Patient Measurements: Weight: 108.9 kg (240 lb 1.3 oz)   Vital Signs: Temp: 99.14 F (37.3 C) (04/16 0800) Temp Source: Core (04/16 0800) BP: 97/80 (04/16 0800) Pulse Rate: 100 (04/16 0800)  Labs: Recent Labs    01/03/21 1107 01/03/21 1251 01/03/21 1809 01/03/21 1812 01/04/21 0305 01/04/21 0403 01/04/21 0413 01/04/21 0751 01/04/21 1848 01/04/21 1938 01/04/21 1944 01/05/21 0202 01/05/21 0400 01/05/21 0504  HGB 11.0*   < > 13.3   < > 13.1   < >  --    < >  --  11.1*   < > 9.9* 11.0* 9.9*  HCT 32.4*   < > 39.1   < > 36.9*   < >  --    < >  --  31.2*   < > 29.0* 31.6* 29.0*  PLT 215   < > 150  --  121*  --   --   --   --  81*  --   --  70*  --   LABPROT 17.8*  --   --   --   --   --   --   --   --   --   --   --   --   --   INR 1.5*  --   --   --   --   --   --   --   --   --   --   --   --   --   HEPARINUNFRC  --   --  <0.10*  --   --   --  <0.10*  --   --   --   --   --  <0.10*  --   CREATININE 1.17  --  1.58*   < > 1.62*  --   --    < > 1.41* 1.41*  --   --  1.45*  --    < > = values in this interval not displayed.    Estimated Creatinine Clearance: 61.4 mL/min (A) (by C-G formula based on SCr of 1.45 mg/dL (H)).   Medical History: Past Medical History:  Diagnosis Date  . Anxiety   . Arthritis   . Cancer ([place]) 2010   basal cell ca, head  . Cataract    bilateral  . Depression   . ED (erectile dysfunction)   . GERD (gastroesophageal reflux disease)   . Hyperlipidemia   . Hypertension   . Kidney stone 07-29-11   passed   . 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 Vitreous floater    left eye, sees Dr. Dawna Part at [facility]      Assessment: 38yom admitted for STEMI s/p VF arrest shock and impella + DES to RCA. Groin bleeding  after cangrelor used prior to OG being placed and ticagrelor given cangrelor now off, PRBC x4 given and groin stable.   Purge solution changed to low dose heparin 25uts/ml 4/14pm  with stable CBC and resolved groin bleeding.   Heparin level <0.1 undetectable this am on purge flow 2ml/hr = 325 uts/hr heparin, purge pressure 400-500   As bleeding is resolving will increase heparin in purge solution to 50uts/ml - bag exchange tomorrow     Goal of Therapy:  Monitor platelets by anticoagulation protocol: Yes   Plan:  Continue low dose heparin purge  Follow up CBC and daily heparin level    Bonnita Nasuti Pharm.D. CPP, BCPS Clinical Pharmacist [phone] 01/05/2021 9:04 AM

## 2021-01-05 NOTE — Progress Notes (Signed)
[facility] Progress Note Patient Name: [name] DOB: [birth year] MRN: [id number]   Date of Service  01/05/2021  HPI/Events of Note  K+ 3.6  eICU Interventions  K+ repleted per adult electrolyte replacement protocol to bring serum K+ > 4.0 meq / liter.        [name] [name] 01/05/2021, 10:21 PM

## 2021-01-06 ENCOUNTER — Inpatient Hospital Stay (HOSPITAL_COMMUNITY): Payer: Medicare PPO

## 2021-01-06 DIAGNOSIS — I5021 Acute systolic (congestive) heart failure: Secondary | ICD-10-CM | POA: Diagnosis not present

## 2021-01-06 DIAGNOSIS — R570 Cardiogenic shock: Secondary | ICD-10-CM | POA: Diagnosis not present

## 2021-01-06 DIAGNOSIS — D696 Thrombocytopenia, unspecified: Secondary | ICD-10-CM

## 2021-01-06 DIAGNOSIS — Z9911 Dependence on respirator [ventilator] status: Secondary | ICD-10-CM | POA: Diagnosis not present

## 2021-01-06 DIAGNOSIS — D62 Acute posthemorrhagic anemia: Secondary | ICD-10-CM

## 2021-01-06 DIAGNOSIS — J9601 Acute respiratory failure with hypoxia: Secondary | ICD-10-CM | POA: Diagnosis not present

## 2021-01-06 DIAGNOSIS — Z95811 Presence of heart assist device: Secondary | ICD-10-CM | POA: Diagnosis not present

## 2021-01-06 LAB — POCT I-STAT 7, (LYTES, BLD GAS, ICA,H+H)
Acid-base deficit: 2 mmol/L (ref 0.0–2.0)
Bicarbonate: 23 mmol/L (ref 20.0–28.0)
Calcium, Ion: 1.03 mmol/L — ABNORMAL LOW (ref 1.15–1.40)
HCT: 27 % — ABNORMAL LOW (ref 39.0–52.0)
Hemoglobin: 9.2 g/dL — ABNORMAL LOW (ref 13.0–17.0)
O2 Saturation: 99 %
Patient temperature: 36.3
Potassium: 3.5 mmol/L (ref 3.5–5.1)
Sodium: 138 mmol/L (ref 135–145)
TCO2: 24 mmol/L (ref 22–32)
pCO2 arterial: 36.1 mmHg (ref 32.0–48.0)
pH, Arterial: 7.409 (ref 7.350–7.450)
pO2, Arterial: 119 mmHg — ABNORMAL HIGH (ref 83.0–108.0)

## 2021-01-06 LAB — BASIC METABOLIC PANEL
Anion gap: 9 (ref 5–15)
Anion gap: 6 (ref 5–15)
BUN: 35 mg/dL — ABNORMAL HIGH (ref 8–23)
BUN: 40 mg/dL — ABNORMAL HIGH (ref 8–23)
CO2: 23 mmol/L (ref 22–32)
CO2: 24 mmol/L (ref 22–32)
Calcium: 7 mg/dL — ABNORMAL LOW (ref 8.9–10.3)
Calcium: 7.4 mg/dL — ABNORMAL LOW (ref 8.9–10.3)
Chloride: 106 mmol/L (ref 98–111)
Chloride: 106 mmol/L (ref 98–111)
Creatinine, Ser: 2.23 mg/dL — ABNORMAL HIGH (ref 0.61–1.24)
Creatinine, Ser: 2.88 mg/dL — ABNORMAL HIGH (ref 0.61–1.24)
GFR, Estimated: 31 mL/min — ABNORMAL LOW (ref >60)
GFR, Estimated: 23 mL/min — ABNORMAL LOW (ref >60)
Glucose, Bld: 116 mg/dL — ABNORMAL HIGH (ref 70–99)
Glucose, Bld: 90 mg/dL (ref 70–99)
Potassium: 3.6 mmol/L (ref 3.5–5.1)
Potassium: 3.6 mmol/L (ref 3.5–5.1)
Sodium: 138 mmol/L (ref 135–145)
Sodium: 136 mmol/L (ref 135–145)

## 2021-01-06 LAB — GLUCOSE, CAPILLARY
Glucose-Capillary: 112 mg/dL — ABNORMAL HIGH (ref 70–99)
Glucose-Capillary: 129 mg/dL — ABNORMAL HIGH (ref 70–99)
Glucose-Capillary: 144 mg/dL — ABNORMAL HIGH (ref 70–99)
Glucose-Capillary: 55 mg/dL — ABNORMAL LOW (ref 70–99)
Glucose-Capillary: 76 mg/dL (ref 70–99)
Glucose-Capillary: 87 mg/dL (ref 70–99)
Glucose-Capillary: 93 mg/dL (ref 70–99)

## 2021-01-06 LAB — HEPARIN LEVEL (UNFRACTIONATED): Heparin Unfractionated: <0.1 IU/mL — ABNORMAL LOW (ref 0.30–0.70)

## 2021-01-06 LAB — CBC
HCT: 30.2 % — ABNORMAL LOW (ref 39.0–52.0)
Hemoglobin: 10 g/dL — ABNORMAL LOW (ref 13.0–17.0)
MCH: 30.4 pg (ref 26.0–34.0)
MCHC: 33.1 g/dL (ref 30.0–36.0)
MCV: 91.8 fL (ref 80.0–100.0)
Platelets: 53 10*3/uL — ABNORMAL LOW (ref 150–400)
RBC: 3.29 MIL/uL — ABNORMAL LOW (ref 4.22–5.81)
RDW: 15.5 % (ref 11.5–15.5)
WBC: 20.3 10*3/uL — ABNORMAL HIGH (ref 4.0–10.5)
nRBC: 0 % (ref 0.0–0.2)

## 2021-01-06 LAB — COOXEMETRY PANEL
Carboxyhemoglobin: 0.6 % (ref 0.5–1.5)
Methemoglobin: 1.2 % (ref 0.0–1.5)
O2 Saturation: 77.1 %
Total hemoglobin: 9.7 g/dL — ABNORMAL LOW (ref 12.0–16.0)

## 2021-01-06 LAB — PHOSPHORUS
Phosphorus: 5.2 mg/dL — ABNORMAL HIGH (ref 2.5–4.6)
Phosphorus: 6 mg/dL — ABNORMAL HIGH (ref 2.5–4.6)

## 2021-01-06 LAB — LACTATE DEHYDROGENASE: LDH: 341 U/L — ABNORMAL HIGH (ref 98–192)

## 2021-01-06 LAB — MAGNESIUM
Magnesium: 2 mg/dL (ref 1.7–2.4)
Magnesium: 2 mg/dL (ref 1.7–2.4)

## 2021-01-06 LAB — TRIGLYCERIDES: Triglycerides: 91 mg/dL (ref <150)

## 2021-01-06 MED ORDER — SODIUM CHLORIDE 0.9 % IV SOLN
2.0000 g | INTRAVENOUS | Status: DC
  Administered 2021-01-07: 2 g via INTRAVENOUS
  Filled 2021-01-06: qty 2

## 2021-01-06 MED ORDER — POTASSIUM CHLORIDE 20 MEQ PO PACK
40.0000 meq | PACK | Freq: Once | ORAL | Status: AC
  Administered 2021-01-06: 40 meq
  Filled 2021-01-06 (×2): qty 2

## 2021-01-06 MED ORDER — EPINEPHRINE HCL 5 MG/250ML IV SOLN IN NS: 0.5000 ug/min | INTRAVENOUS | Status: DC

## 2021-01-06 MED ORDER — POTASSIUM CHLORIDE 10 MEQ/50ML IV SOLN
10.0000 meq | INTRAVENOUS | Status: AC
  Administered 2021-01-06 – 2021-01-07 (×2): 10 meq via INTRAVENOUS
  Filled 2021-01-06: qty 50

## 2021-01-06 MED ORDER — POTASSIUM CHLORIDE 20 MEQ PO PACK
40.0000 meq | PACK | Freq: Once | ORAL | Status: AC
  Administered 2021-01-06: 40 meq
  Filled 2021-01-06: qty 2

## 2021-01-06 MED ORDER — DEXTROSE 50 % IV SOLN
INTRAVENOUS | Status: AC
  Administered 2021-01-06: 50 mL
  Filled 2021-01-06: qty 50

## 2021-01-06 MED ORDER — VANCOMYCIN HCL 1250 MG/250ML IV SOLN
1250.0000 mg | INTRAVENOUS | Status: DC
  Administered 2021-01-08: 1250 mg via INTRAVENOUS
  Filled 2021-01-06: qty 250

## 2021-01-06 MED ORDER — METOLAZONE 5 MG PO TABS
5.0000 mg | ORAL_TABLET | Freq: Once | ORAL | Status: AC
  Administered 2021-01-06: 5 mg via ORAL
  Filled 2021-01-06: qty 1

## 2021-01-06 MED ORDER — METOCLOPRAMIDE HCL 5 MG/ML IJ SOLN
5.0000 mg | Freq: Three times a day (TID) | INTRAMUSCULAR | Status: DC
  Administered 2021-01-06 – 2021-01-12 (×18): 5 mg via INTRAVENOUS
  Filled 2021-01-06 (×18): qty 2

## 2021-01-06 MED ORDER — CALCIUM GLUCONATE-NACL 2-0.675 GM/100ML-% IV SOLN
2.0000 g | Freq: Once | INTRAVENOUS | Status: AC
  Administered 2021-01-06: 2000 mg via INTRAVENOUS
  Filled 2021-01-06: qty 100

## 2021-01-06 MED ORDER — INSULIN DETEMIR 100 UNIT/ML ~~LOC~~ SOLN
25.0000 [IU] | Freq: Every day | SUBCUTANEOUS | Status: DC
  Filled 2021-01-06: qty 0.25

## 2021-01-06 NOTE — Progress Notes (Signed)
Pharmacy Antibiotic Note  [name] is a 70 y.o. male admitted on 01/03/2021 with sepsis.  Pharmacy has been consulted for vancomycin and cefepime dosing.  Renal function continues worsening, now ClCr ~ 40ml/min. WBC down to 20. Unclear source of infection, likely reactive leukocytosis. . Last fever 4/15 at 100.4.    4/16 Vancomycin 1250mg  Q 48hr Scr used: 2.88 mg/dL Weight: 107 kg Vd coeff: 0.5 L/kg Est AUC: 458  Plan: Decrease cefepime to 2g Q 24 hr Decrease vancomycin to 1250g q48hr   Monitor cultures, clinical status, renal fx, vanc level  Narrow abx as able and f/u duration    Height: 6\' 1"  (185.4 cm) Weight: 106.9 kg (235 lb 10.8 oz) IBW/kg (Calculated) : 79.9  Temp (24hrs), Avg:98 F (36.7 C), Min:96.6 F (35.9 C), Max:99.32 F (37.4 C)  Recent Labs  Lab 01/03/21 1440 01/03/21 1809 01/03/21 2029 01/03/21 2157 01/03/21 2305 01/04/21 0305 01/04/21 0455 01/04/21 0751 01/04/21 1109 01/04/21 1848 01/04/21 1938 01/05/21 0400 01/05/21 1600 01/05/21 2028 01/06/21 0408 01/06/21 2012  WBC  --  22.0*  --   --   --  18.5*  --   --   --   --  22.9* 23.3*  --   --  20.3*  --   CREATININE  --  1.58*   < >  --    < > 1.62*  --    < > 1.34*   < > 1.41* 1.45* 1.67* 1.85* 2.23* 2.88*  LATICACIDVEN 6.0* 9.2*  --  9.7*  --   --  5.5*  --  2.3*  --   --   --   --   --   --   --    < > = values in this interval not displayed.    Estimated Creatinine Clearance: 30.6 mL/min (A) (by C-G formula based on SCr of 2.88 mg/dL (H)).    Allergies  Allergen Reactions  . Codeine Other (See Comments)    hallucinations  . Lisinopril Cough    Antimicrobials this admission: Vanc 4/14 >>  Cefe 4/15 >>    Microbiology results: 4/14 Covid/flu neg  4/14 MRSA PCR: neg  Thank you for allowing pharmacy to be a part of this patient's care.  Benetta Spar, PharmD, BCPS, BCCP Clinical Pharmacist  Please check AMION for all [place] phone numbers After 10:00 PM, call [place]  [phone]

## 2021-01-06 NOTE — Progress Notes (Signed)
ANTICOAGULATION CONSULT NOTE - Follow Up Consult  Pharmacy Consult for heparin Indication: Impella   Allergies  Allergen Reactions  . Codeine Other (See Comments)    hallucinations  . Lisinopril Cough    Patient Measurements: Height: 6\' 1"  (185.4 cm) Weight: 106.9 kg (235 lb 10.8 oz) IBW/kg (Calculated) : 79.9   Vital Signs: Temp: 97.9 F (36.6 C) (04/17 2030) Temp Source: Core (04/17 1700) BP: 128/62 (04/17 2000) Pulse Rate: 72 (04/17 2030)  Labs: Recent Labs    01/04/21 0413 01/04/21 0751 01/04/21 1938 01/04/21 1944 01/05/21 0400 01/05/21 0504 01/05/21 1600 01/05/21 1616 01/05/21 2028 01/06/21 0408 01/06/21 0434  HGB  --    < > 11.1*   < > 11.0*   < >  --  9.9*  --  10.0* 9.2*  HCT  --    < > 31.2*   < > 31.6*   < >  --  29.0*  --  30.2* 27.0*  PLT  --   --  81*  --  70*  --   --   --   --  53*  --   HEPARINUNFRC <0.10*  --   --   --  <0.10*  --   --   --   --  <0.10*  --   CREATININE  --    < > 1.41*  --  1.45*  --  1.67*  --  1.85* 2.23*  --    < > = values in this interval not displayed.    Estimated Creatinine Clearance: 39.5 mL/min (A) (by C-G formula based on SCr of 2.23 mg/dL (H)).   Medical History: Past Medical History:  Diagnosis Date  . Anxiety   . Arthritis   . Cancer ([place]) 2010   basal cell ca, head  . Cataract    bilateral  . Depression   . ED (erectile dysfunction)   . GERD (gastroesophageal reflux disease)   . Hyperlipidemia   . Hypertension   . Kidney stone 07-29-11   passed   . 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 Vitreous floater    left eye, sees [facility]. Dawn[facility] at [facility]      Assessment: 38yom admitted for STEMI s/p VF arrest shock and impella + DES to RCA. Groin bleeding after cangrelor used prior to OG being placed and ticagrelor given cangrelor now off, PRBC x4 given and groin stable.   Previous plan was to increase heparin in purge solution to 50uts/ml at next bag exchange but as of 21:15, RN  reported new bleeding at impella site requiring a dressing change every hour. Will hold on increasing heparin in purge and continue 25 units/ml bag.     Goal of Therapy:  Monitor platelets by anticoagulation protocol: Yes   Plan:  Continue low dose heparin purge  Follow up CBC and daily heparin level  Benetta Spar, PharmD, BCPS, [facility] Clinical Pharmacist  Please check AMION for all [place] phone numbers After 10:00 PM, call [place] [phone]

## 2021-01-06 NOTE — Progress Notes (Signed)
Advanced Heart Failure Rounding Note  PCP-Cardiologist: No primary care provider on file.   Subjective:    Remains intubated and sedated. FiO2 remains at 100%. According to RNs woke up last night and followed simple commands  Impella down to P-6 due to suction. Flow 2.7 Waveforms ok  On NE 6, EPI 14 + lasix gtt at 6. Weight down 5 pounds.   Co-ox 77%. Creatinine 1.7 -> 1.8 -> 2.2  WBC 23K -> 20k   [place] numbers done personally:  CVP 14 (v = 25) PA  31/15 (22) PCWP  15 Thermo 5.2/2.5 SVR 899 PVR 1.6 PAPI 1.2   Objective:   Weight Range: 106.9 kg Body mass index is 31.09 kg/m.   Vital Signs:   Temp:  [96.6 F (35.9 C)-99.1 F (37.3 C)] 99.1 F (37.3 C) (04/17 0700) Pulse Rate:  [70-86] 79 (04/17 0700) Resp:  [17-23] 18 (04/17 0700) BP: (94-132)/(57-72) 102/57 (04/17 0757) SpO2:  [86 %-100 %] 100 % (04/17 0757) Arterial Line BP: (94-153)/(53-77) 97/54 (04/17 0700) FiO2 (%):  [80 %-100 %] 80 % (04/17 0757) Weight:  [106.9 kg] 106.9 kg (04/17 0452) Last BM Date:  (PTA)  Weight change: Filed Weights   01/04/21 0500 01/05/21 0500 01/06/21 0452  Weight: 104.7 kg 108.9 kg 106.9 kg    Intake/Output:   Intake/Output Summary (Last 24 hours) at 01/06/2021 0846 Last data filed at 01/06/2021 0700 Gross per 24 hour  Intake 2814.97 ml  Output 4300 ml  Net -1485.03 ml      Physical Exam    General:  Intubated/sedated HEENT: normal + ETT Neck: supple. RIJ swan. Carotids 2+ bilat; no bruits. No lymphadenopathy or thryomegaly appreciated. Cor: PMI nondisplaced. Regular rate & rhythm. No rubs, gallops or murmurs. Lungs: clear Abdomen: soft, nontender, nondistended. No hepatosplenomegaly. No bruits or masses. Good bowel sounds. Extremities: no cyanosis, clubbing, rash, 3+  Edema  + LFA Impella site with large ecchymosis Neuro: intubated sedated    Telemetry   Sinus 70-80s Personally reviewed  Labs    CBC Recent Labs    01/05/21 0400 01/05/21 0504  01/06/21 0408 01/06/21 0434  WBC 23.3*  --  20.3*  --   HGB 11.0*   < > 10.0* 9.2*  HCT 31.6*   < > 30.2* 27.0*  MCV 87.8  --  91.8  --   PLT 70*  --  53*  --    < > = values in this interval not displayed.   Basic Metabolic Panel Recent Labs    01/05/21 2028 01/06/21 0408 01/06/21 0434  NA 137 138 138  K 3.6 3.6 3.5  CL 107 106  --   CO2 23 23  --   GLUCOSE 117* 116*  --   BUN 31* 35*  --   CREATININE 1.85* 2.23*  --   CALCIUM 6.7* 7.0*  --   MG 2.1 2.0  --   PHOS 5.6* 5.2*  --    Liver Function Tests Recent Labs    01/03/21 1107 01/05/21 2028  AST 73* 38  ALT 65* 35  ALKPHOS 27* 34*  BILITOT 0.9 0.6  PROT 4.6* 4.4*  ALBUMIN 2.7* 2.1*   No results for input(s): LIPASE, AMYLASE in the last 72 hours. Cardiac Enzymes No results for input(s): CKTOTAL, CKMB, CKMBINDEX, TROPONINI in the last 72 hours.  BNP: BNP (last 3 results) No results for input(s): BNP in the last 8760 hours.  ProBNP (last 3 results) No results for input(s): PROBNP in  the last 8760 hours.   D-Dimer No results for input(s): DDIMER in the last 72 hours. Hemoglobin A1C No results for input(s): HGBA1C in the last 72 hours. Fasting Lipid Panel Recent Labs    01/06/21 0408  TRIG 91   Thyroid Function Tests No results for input(s): TSH, T4TOTAL, T3FREE, THYROIDAB in the last 72 hours.  Invalid input(s): FREET3  Other results:   Imaging    No results found.   Medications:     Scheduled Medications: . aspirin  81 mg Per Tube Daily  . chlorhexidine gluconate (MEDLINE KIT)  15 mL Mouth Rinse BID  . Chlorhexidine Gluconate Cloth  6 each Topical Daily  . feeding supplement (PROSource TF)  45 mL Per Tube TID  . feeding supplement (VITAL 1.5 CAL)  1,000 mL Per Tube Q24H  . insulin aspart  2 Units Subcutaneous Q4H  . insulin aspart  2-6 Units Subcutaneous Q4H  . insulin detemir  30 Units Subcutaneous Daily  . mouth rinse  15 mL Mouth Rinse 10 times per day  . metoCLOPramide  (REGLAN) injection  5 mg Intravenous Q8H  . pantoprazole sodium  40 mg Per Tube Daily  . polyethylene glycol  17 g Per Tube BID  . potassium chloride  40 mEq Per Tube Once  . rosuvastatin  10 mg Per Tube Daily  . sodium chloride flush  10-40 mL Intracatheter Q12H  . sodium chloride flush  3 mL Intravenous Q12H  . ticagrelor  90 mg Per Tube BID    Infusions: . sodium chloride 250 mL (01/05/21 2202)  . ceFEPime (MAXIPIME) IV    . dextrose    . EPINEPHrine 8 mg in dextrose 5% 250 mL infusion (32 mcg/mL) 14 mcg/min (01/06/21 0700)  . fentaNYL infusion INTRAVENOUS 200 mcg/hr (01/06/21 0700)  . furosemide (LASIX) 200 mg in dextrose 5% 100 mL (40m/mL) infusion 6 mg/hr (01/06/21 0700)  . impella catheter heparin 25 unit/mL in dextrose 5%    . midazolam Stopped (01/03/21 1300)  . norepinephrine (LEVOPHED) Adult infusion 6 mcg/min (01/06/21 0700)  . propofol (DIPRIVAN) infusion 60 mcg/kg/min (01/06/21 0700)  . vancomycin      PRN Medications: sodium chloride, sodium chloride, acetaminophen (TYLENOL) oral liquid 160 mg/5 mL, dextrose, ondansetron (ZOFRAN) IV, sodium chloride flush, sodium chloride flush    Assessment/Plan   1. CAD/ Acute Inferior STEMI - emergent cath w/ pRCA occlusion s/p PCI + DES - Initial angiography showed sluggish flow in LAD and LCX>>improved post impella placement  - Echo LVEF 20-25%, RV moderately reduced - DAPT w/ ASA + Brilinta  - Crestor 10 per tube   2. VF Arrest - in setting of acute MI, now s/p revascularization  - no further ectopy on tele. Amio stopped - keep K > 4.0 and Mg > 2.0  - EEG with diffuse encephalopathy but no seizure activity. Per RN staff patient followed simple commands overnight  3. Shock, Combination Cardiogenic + Hemorrhagic  - Acute MI + developed severe hemorrhagic shock due to bleeding at left groin Impella Transfused 4U PRBCs . hgb now stable - impella + inotrope support for cardiogenic shock (see discussion below)  4. Acute  Systolic Heart Failure>>Cardiogenic Shock  - ICM Echo LVEF 20-25%, RV moderately reduced - Continue Impella at P-6. Waveforms ok. Position stable on CXR (Personally reviewed) - He is well supported on the left side with Impella. Main issue now is RV failure and marked volume overload in setting of worsening AKI (suspect ATN) - Remains on high-dose  pressor support adjusting as needed. - Goal for today is to keep SBP > 100 and make him negative at least 2-3L  - Increase lasix gtt to 8. Give metoalzone 5. - If renal function worsening or stops peeing will need to consider CVVHD - Prognosis increasingly concerning - stop dig with AKI  5. Acute hypoxic respiratory failure - now on 60% FIO2 - continue to diurese aggressively - CXR improving - needs more fluid off to help wean vent - d/w CCM at bedside  5. AKI - due to ATN/shock - SCr 1.17 -> 1.6 -> 1.4 -> 1.7 -> 1.8 -> 2.2 - Keep SBP > 100 - Plan as above  6. Hypokalemia/hyperkalemia - K 3.6. supp gently  7. Leukocytosis - WBC 23K -> 20K. - ? Reactive from acute MI. With increasing co-ox watch for sepsis - continue empiric abx  8. DM2 - off insulin gtt - now on levimir and SSI  CRITICAL CARE Performed by: Glori Bickers  Total critical care time: 45 minutes  Critical care time was exclusive of separately billable procedures and treating other patients.  Critical care was necessary to treat or prevent imminent or life-threatening deterioration.  Critical care was time spent personally by me (independent of midlevel providers or residents) on the following activities: development of treatment plan with patient and/or surrogate as well as nursing, discussions with consultants, evaluation of patient's response to treatment, examination of patient, obtaining history from patient or surrogate, ordering and performing treatments and interventions, ordering and review of laboratory studies, ordering and review of radiographic  studies, pulse oximetry and re-evaluation of patient's condition.    Length of Stay: 3  Glori Bickers, MD  01/06/2021, 8:46 AM  Advanced Heart Failure Team Pager [phone] (M-F; 7a - 5p)  Please contact [facility] for night-coverage after hours (5p -7a ) and weekends on [email or web address]   Glori Bickers, MD  8:46 AM

## 2021-01-06 NOTE — Progress Notes (Signed)
Wake-Up Assessment start 0545  Patient was on 26mcg of Prop and 279mcg of Fent paused for 53min  Assessment - Open eyes to voice - Squeeze Right Hand to Command, Left Hand weak  - Wiggles both Right and Left Feet to Command  CO/CI increased CO: 7.47  CI: 3.54  Pt became asychronus with Vent, sedation restarted Prop 63mcg Fent 125mcg.

## 2021-01-06 NOTE — Progress Notes (Signed)
[facility] Progress Note Patient Name: Damarian Priola DOB: 06-11-1951 MRN: [id number]   Date of Service  01/06/2021  HPI/Events of Note  K+ 3.6, parameter is to keep K+ > 4.0.  eICU Interventions  Will order additional K+ replacement.        Kerry Kass Alaena Strader 01/06/2021, 10:55 PM

## 2021-01-06 NOTE — Progress Notes (Signed)
RN called Dr. Haroldine Laws in regards to patients decreasing trend in CI. Per MD, drop Lasix drip to 6mg /hr and drop patient to P6 on the impella. Pt can be placed back on P7 if tolerable with no suction events. As far as vasopressors, try not to go above 3mcg of Epi.

## 2021-01-06 NOTE — Progress Notes (Signed)
23mcg Fent bolus given d/t continous vent asynchrony, full body tremor movement, and increased salivary secretions.   Sedation placed back to previous rate before Wake-Up Assessment to 40mcg/kg/min Prop and 291mcg/hr Fent

## 2021-01-06 NOTE — Progress Notes (Signed)
NAME:  [name], MRN:  [id number], DOB:  02-24-1951, LOS: 3 ADMISSION DATE:  01/03/2021, CONSULTATION DATE:  01/03/2021 REFERRING MD:  Dr. Haroldine Laws, CHIEF COMPLAINT:  STEMI/ cardiogenic shock  History of Present Illness:  HPI obtained from medical chart review as patient is currently intubated and sedated on mechanical ventilation.  70 year old male with prior history of hypertension, hyperlipidemia, and CAD presenting by EMS with sudden onset of chest pain found to have inferior STEMI with bradycardia and hypotension.  Taken emergently to Cath Lab on arrival.  A temporary pacer was placed with good capture, but then developed profound hypotension decompensating to cardiac arrest, requiring intubation by anesthesia, CPR, episodes of V. fib requiring defibrillation and amiodarone.  Found to have total proximal RCA occlusion and underwent PCI to the RCA with stent placement.  Procedure further complicated by cardiogenic shock requiring placement of Impella device complicated by bleeding secondary to cangrelor with development of bleeding and hematoma at puncture sites, Hgb 12.6-> 7.5,  requiring emergent transfusion of 4 units PRBC and stopping of cangrelor with stabilization of hemodynamics but remains on NE, epi, and vasopressin drips.  Patient returns to ICU intubated and sedated in cardiogenic shock with TVP and impella.  Unable to place aline in cath lab secondary to bleeding.  Labs pending.  PCCM consulted for vent management.   Pertinent  Medical History  HTN, HLD, CAD  Significant Hospital Events: Including procedures, antibiotic start and stop dates in addition to other pertinent events   . 4/14 admitted Cards with inferior STEMI-> bradycardia s/p TVP, cardiogenic shock and cardiac arrest s/p impella with PCI/stent to RCA, complicated by bleeding s/p 4 units PRBC thought 2/2 cangrelor, pending coags.  Has R IJ PA cath, TVP R groin, impella L groin . 4/15> remains on impella, weaning  pressors and inotropes. Started diuresis due to significantly increased oxygen requirements.  Interim History / Subjective:  Suction events with impella overnight, down to P-6. Lasix infusion decreased. Was able to follow commands on his wake up assessment performed overnight.  Overnight had desaturation events that were associated with suction alarms which were covered with resumed Impella flow.  Back down to 60% FiO2 this morning.  Objective   Blood pressure (!) 102/57, pulse 79, temperature 99.1 F (37.3 C), resp. rate 18, height 6\' 1"  (1.854 m), weight 106.9 kg, SpO2 100 %. PAP: (30-46)/(15-31) 34/20 CVP:  [9 mmHg-21 mmHg] 15 mmHg CO:  [4.2 L/min-7.5 L/min] 7.5 L/min CI:  [2 L/min/m2-3.5 L/min/m2] 3.5 L/min/m2  Vent Mode: PRVC FiO2 (%):  [80 %-100 %] 80 % Set Rate:  [18 bmp] 18 bmp Vt Set:  [640 mL] 640 mL PEEP:  [15 cmH20] 15 cmH20 Plateau Pressure:  [26 cmH20-28 cmH20] 28 cmH20   Intake/Output Summary (Last 24 hours) at 01/06/2021 1023 Last data filed at 01/06/2021 0700 Gross per 24 hour  Intake 2670.66 ml  Output 3895 ml  Net -1224.34 ml   Filed Weights   01/04/21 0500 01/05/21 0500 01/06/21 0452  Weight: 104.7 kg 108.9 kg 106.9 kg    Examination: General: Critically ill-appearing man intubated, sedated, on mechanical ventilation and mechanical cardiac support HEENT: [place]/AT, eyes anicteric Neuro: RASS -5, not breathing over the vent, no gag reflex.  Pinpoint pupils. CV: S1-S2, regular rate and rhythm, not paced PULM: Reduced basilar breath sounds, rales bilaterally.  No significant endotracheal secretions. GI: Soft, nontender, nondistended, hypoactive bowel sounds Extremities: Persistent significant edema, large hematoma around Impella in the left groin, stable, TVP in the right groin.  TED hose. Skin: Pallor, bruising around right IJ CVC minimal, resolving bruise on the left shoulder GU: Stable hematoma on the left side of the scrotum, Foley  Labs/imaging that I have  personally reviewed  (right click and "Reselect all SmartList Selections" daily)  4/14 LHC >>  Ost LAD to Prox LAD lesion is 50% stenosed.  Mid LAD lesion is 20% stenosed.  Prox RCA-1 lesion is 100% stenosed.  Prox RCA-2 lesion is 50% stenosed.  A stent was successfully placed.  Post intervention, there is a 0% residual stenosis.  Post intervention, there is a 0% residual stenosis.  LDH 341 coox 77% BUN 35 Cr. 2.23 K+ 3.6 WBC 20.3 H/H 10/30 Platelets 53  CXR personally reviewed> improved effusions bilaterally, increased opacification of right upper lobe.  ET tube 4 cm above carina  Resolved Hospital Problem list   Hemorrhagic shock Lactic acidosis  Assessment & Plan:   Inferior STEMI s/p PCI/ stent to main dominant RCA requiring mechanical and inotropic support Cardiogenic shock due to ischemic cardiomyopathy Vfib cardiac arrest  Bradycardia s/p TVP; now intrinsically pacing P:  -Continue cardiac support per cardiology-Impella at P6.  Continue epinephrine, norepinephrine. -Continue Lasix drip -Continue aspirin and Brilinta daily -Continue rosuvastatin daily -Due to blockers contraindicated with shock -con't DAPT with ASA & brillinta -Continue broad empiric antibiotics for multiple devices -Previously completed IV amiodarone course  Acute hypoxic respiratory failure, acute cardiogenic pulmonary edema P:  -Con't LTVV, 4-8cc/kg IBW with goal Pplat <30 and DP<15. Meeting goals. Titrate PEEP & FiO2 per ARDS ladder, unable to prone due to vascular devices.  -Titrate down FiO2 as able to maintain SPO2 greater than 90%.  Back to 60% today -VAP prevention protocol -PAD protocol for sedation.  Goal RASS -4 to -5 given ongoing hemodynamic instability. -Daily SAT and SBT when appropriate.  Remains too hemodynamically unstable at this point.  Hyperglycemia- now controlled. -Decrease Levemir to 25 units twice daily -Continue tube feed coverage Q4h with hold  parameters -SSI PRN -Goal BG 140-180  Hemorrhagic shock> resolved, hematoma around Impella insertion site due to cangrelor infusion requiring during LHC. Hematoma and H/H stable. Acute blood loss anemia Acute thrombocytopenia- multifactorial from bleeding, impella, critical illness, antibiotics - s/p emergent 4 units PRBC at 4/14 - cont' to monitor closely for bleeding -serial CBCs -Transfuse for hemoglobin less than 7 or hemodynamically significant bleeding, platelets less than 10 or less than 50 with active bleeding  AKI-worsening -Renally dose medications, avoid nephrotoxic meds -Strict I/os -Continue diuresis -Continue to monitor renal function.  Hyperkalemia, resolved Hypocalcemia hyperphosphatemia -con't to monitor; cautious electrolyte repletion with AKI -needs continued diuresis  H/o HTN; currently in shock P:  -Holding PTA meds while in shock  HLD -Continue daily Crestor  Leukocytosis, fever yesterday; persistently elevated P:  - likely reactive, but continue empiric antibiotics  At risk for malnutrition -Continue trickle feeds due to ongoing significant inotrope requirements  Neighbor updated at bedside today, who will be his primary decision maker moving forward.   Best practice (right click and "Reselect all SmartList Selections" daily)  Diet:  Tube Feed  Pain/Anxiety/Delirium protocol (if indicated): Yes (RASS goal -4,-5) VAP protocol (if indicated): Yes DVT prophylaxis: Systemic AC GI prophylaxis: PPI Glucose control:  SSI Yes and Basal insulin Yes Central venous access:  Yes, and it is still needed Arterial line:  Yes, and it is still needed Foley:  Yes, and it is still needed Mobility:  bed rest  PT consulted: N/A Last date of multidisciplinary goals of  care discussion [4/14 per cardiology ] Code Status:  full code Disposition: ICU   Labs   CBC: Recent Labs  Lab 01/03/21 1809 01/03/21 1812 01/04/21 0305 01/04/21 0403 01/04/21 1938  01/04/21 1944 01/05/21 0400 01/05/21 0504 01/05/21 1616 01/06/21 0408 01/06/21 0434  WBC 22.0*  --  18.5*  --  22.9*  --  23.3*  --   --  20.3*  --   HGB 13.3   < > 13.1   < > 11.1*   < > 11.0* 9.9* 9.9* 10.0* 9.2*  HCT 39.1   < > 36.9*   < > 31.2*   < > 31.6* 29.0* 29.0* 30.2* 27.0*  MCV 89.9  --  85.0  --  86.7  --  87.8  --   --  91.8  --   PLT 150  --  121*  --  81*  --  70*  --   --  53*  --    < > = values in this interval not displayed.    Basic Metabolic Panel: Recent Labs  Lab 01/04/21 1109 01/04/21 1812 01/04/21 1938 01/04/21 1944 01/04/21 1949 01/04/21 2205 01/05/21 0400 01/05/21 0504 01/05/21 1600 01/05/21 1616 01/05/21 2028 01/06/21 0408 01/06/21 0434  NA 139   < > 136   < >  --    < > 137   < > 139 139 137 138 138  K 4.0   < > 6.1*   < >  --    < > 5.4*   < > 3.7 3.7 3.6 3.6 3.5  CL 113*   < > 109  --   --   --  108  --  109  --  107 106  --   CO2 23   < > 23  --   --   --  23  --  24  --  23 23  --   GLUCOSE 89   < > 162*  --   --   --  154*  --  126*  --  117* 116*  --   BUN 19   < > 20  --   --   --  22  --  28*  --  31* 35*  --   CREATININE 1.34*   < > 1.41*  --   --   --  1.45*  --  1.67*  --  1.85* 2.23*  --   CALCIUM 6.6*   < > 6.2*  --   --   --  6.7*  --  6.8*  --  6.7* 7.0*  --   MG 2.0  --   --   --   --   --  2.0  --  2.0  --  2.1 2.0  --   PHOS 1.7*  --   --   --  3.0  --  4.0  --   --   --  5.6* 5.2*  --    < > = values in this interval not displayed.    This patient is critically ill with multiple organ system failure which requires frequent high complexity decision making, assessment, support, evaluation, and titration of therapies. This was completed through the application of advanced monitoring technologies and extensive interpretation of multiple databases. During this encounter critical care time was devoted to patient care services described in this note for 50 minutes.   Julian Hy, DO 01/06/21 10:23 AM [facility]

## 2021-01-06 NOTE — Progress Notes (Signed)
[facility] Progress Note Patient Name: [name] DOB: 1951-06-09 MRN: [id number]   Date of Service  01/06/2021  HPI/Events of Note  Ionized calcium 1.03  eICU Interventions  Calcium gluconate 2 gm iv x 1        Kyliyah Stirn U Heydy Montilla 01/06/2021, 5:37 AM

## 2021-01-06 NOTE — Progress Notes (Signed)
ANTICOAGULATION CONSULT NOTE - Follow Up Consult  Pharmacy Consult for heparin Indication: Impella   Allergies  Allergen Reactions  . Codeine Other (See Comments)    hallucinations  . Lisinopril Cough    Patient Measurements: Height: 6\' 1"  (185.4 cm) Weight: 106.9 kg (235 lb 10.8 oz) IBW/kg (Calculated) : 79.9   Vital Signs: Temp: 99.1 F (37.3 C) (04/17 0700) Temp Source: Core (04/17 0400) BP: 102/57 (04/17 0757) Pulse Rate: 79 (04/17 0700)  Labs: Recent Labs    01/03/21 1107 01/03/21 1251 01/04/21 0413 01/04/21 0751 01/04/21 1938 01/04/21 1944 01/05/21 0400 01/05/21 0504 01/05/21 1600 01/05/21 1616 01/05/21 2028 01/06/21 0408 01/06/21 0434  HGB 11.0*   < >  --    < > 11.1*   < > 11.0*   < >  --  9.9*  --  10.0* 9.2*  HCT 32.4*   < >  --    < > 31.2*   < > 31.6*   < >  --  29.0*  --  30.2* 27.0*  PLT 215   < >  --   --  81*  --  70*  --   --   --   --  53*  --   LABPROT 17.8*  --   --   --   --   --   --   --   --   --   --   --   --   INR 1.5*  --   --   --   --   --   --   --   --   --   --   --   --   HEPARINUNFRC  --    < > <0.10*  --   --   --  <0.10*  --   --   --   --  <0.10*  --   CREATININE 1.17   < >  --    < > 1.41*  --  1.45*  --  1.67*  --  1.85* 2.23*  --    < > = values in this interval not displayed.    Estimated Creatinine Clearance: 39.5 mL/min (A) (by C-G formula based on SCr of 2.23 mg/dL (H)).   Medical History: Past Medical History:  Diagnosis Date  . Anxiety   . Arthritis   . Cancer ([place]) 2010   basal cell ca, head  . Cataract    bilateral  . Depression   . ED (erectile dysfunction)   . GERD (gastroesophageal reflux disease)   . Hyperlipidemia   . Hypertension   . Kidney stone 07-29-11   passed   . 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 Vitreous floater    left eye, sees Dr. Dawna [facility] at [facility]      Assessment: 54yom admitted for STEMI s/p VF arrest shock and impella + DES to RCA. Groin bleeding  after cangrelor used prior to OG being placed and ticagrelor given cangrelor now off, PRBC x4 given and groin stable.   Purge solution changed to low dose heparin 25uts/ml 4/14pm  with stable CBC and resolved groin bleeding.   Heparin level <0.1 undetectable this am on purge flow 28ml/hr = 350 uts/hr heparin, purge pressure 400-500   As bleeding is resolving will increase heparin in purge solution to 50uts/ml - bag exchange today     Goal of Therapy:  Monitor platelets by anticoagulation protocol: Yes   Plan:  Continue low dose heparin purge  Follow up CBC and daily heparin level    Bonnita Nasuti Pharm.D. CPP, BCPS Clinical Pharmacist [phone] 01/06/2021 8:42 AM

## 2021-01-07 ENCOUNTER — Inpatient Hospital Stay (HOSPITAL_COMMUNITY): Payer: Medicare PPO

## 2021-01-07 DIAGNOSIS — Z978 Presence of other specified devices: Secondary | ICD-10-CM | POA: Diagnosis not present

## 2021-01-07 DIAGNOSIS — R569 Unspecified convulsions: Secondary | ICD-10-CM | POA: Diagnosis not present

## 2021-01-07 DIAGNOSIS — Z95811 Presence of heart assist device: Secondary | ICD-10-CM | POA: Diagnosis not present

## 2021-01-07 DIAGNOSIS — R570 Cardiogenic shock: Secondary | ICD-10-CM | POA: Diagnosis not present

## 2021-01-07 LAB — BASIC METABOLIC PANEL
Anion gap: 10 (ref 5–15)
Anion gap: 11 (ref 5–15)
BUN: 43 mg/dL — ABNORMAL HIGH (ref 8–23)
BUN: 44 mg/dL — ABNORMAL HIGH (ref 8–23)
CO2: 24 mmol/L (ref 22–32)
CO2: 26 mmol/L (ref 22–32)
Calcium: 7.6 mg/dL — ABNORMAL LOW (ref 8.9–10.3)
Calcium: 8.1 mg/dL — ABNORMAL LOW (ref 8.9–10.3)
Chloride: 104 mmol/L (ref 98–111)
Chloride: 101 mmol/L (ref 98–111)
Creatinine, Ser: 3.14 mg/dL — ABNORMAL HIGH (ref 0.61–1.24)
Creatinine, Ser: 3.4 mg/dL — ABNORMAL HIGH (ref 0.61–1.24)
GFR, Estimated: 21 mL/min — ABNORMAL LOW (ref >60)
GFR, Estimated: 19 mL/min — ABNORMAL LOW (ref >60)
Glucose, Bld: 78 mg/dL (ref 70–99)
Glucose, Bld: 75 mg/dL (ref 70–99)
Potassium: 3.6 mmol/L (ref 3.5–5.1)
Potassium: 3.6 mmol/L (ref 3.5–5.1)
Sodium: 138 mmol/L (ref 135–145)
Sodium: 138 mmol/L (ref 135–145)

## 2021-01-07 LAB — GLUCOSE, CAPILLARY
Glucose-Capillary: 86 mg/dL (ref 70–99)
Glucose-Capillary: 88 mg/dL (ref 70–99)
Glucose-Capillary: 75 mg/dL (ref 70–99)
Glucose-Capillary: 73 mg/dL (ref 70–99)
Glucose-Capillary: 75 mg/dL (ref 70–99)
Glucose-Capillary: 90 mg/dL (ref 70–99)
Glucose-Capillary: 131 mg/dL — ABNORMAL HIGH (ref 70–99)

## 2021-01-07 LAB — COOXEMETRY PANEL
Carboxyhemoglobin: 0.8 % (ref 0.5–1.5)
Methemoglobin: 1.2 % (ref 0.0–1.5)
O2 Saturation: 72.8 %
Total hemoglobin: 10 g/dL — ABNORMAL LOW (ref 12.0–16.0)

## 2021-01-07 LAB — LACTATE DEHYDROGENASE: LDH: 344 U/L — ABNORMAL HIGH (ref 98–192)

## 2021-01-07 LAB — POCT I-STAT 7, (LYTES, BLD GAS, ICA,H+H)
Acid-Base Excess: 0 mmol/L (ref 0.0–2.0)
Acid-Base Excess: 1 mmol/L (ref 0.0–2.0)
Bicarbonate: 23.9 mmol/L (ref 20.0–28.0)
Bicarbonate: 24.6 mmol/L (ref 20.0–28.0)
Calcium, Ion: 1.08 mmol/L — ABNORMAL LOW (ref 1.15–1.40)
Calcium, Ion: 1.1 mmol/L — ABNORMAL LOW (ref 1.15–1.40)
HCT: 28 % — ABNORMAL LOW (ref 39.0–52.0)
HCT: 28 % — ABNORMAL LOW (ref 39.0–52.0)
Hemoglobin: 9.5 g/dL — ABNORMAL LOW (ref 13.0–17.0)
Hemoglobin: 9.5 g/dL — ABNORMAL LOW (ref 13.0–17.0)
O2 Saturation: 98 %
O2 Saturation: 95 %
Patient temperature: 37.3
Patient temperature: 36.8
Potassium: 3.7 mmol/L (ref 3.5–5.1)
Potassium: 3.4 mmol/L — ABNORMAL LOW (ref 3.5–5.1)
Sodium: 138 mmol/L (ref 135–145)
Sodium: 138 mmol/L (ref 135–145)
TCO2: 25 mmol/L (ref 22–32)
TCO2: 26 mmol/L (ref 22–32)
pCO2 arterial: 35.6 mmHg (ref 32.0–48.0)
pCO2 arterial: 34.8 mmHg (ref 32.0–48.0)
pH, Arterial: 7.436 (ref 7.350–7.450)
pH, Arterial: 7.456 — ABNORMAL HIGH (ref 7.350–7.450)
pO2, Arterial: 106 mmHg (ref 83.0–108.0)
pO2, Arterial: 72 mmHg — ABNORMAL LOW (ref 83.0–108.0)

## 2021-01-07 LAB — CBC
HCT: 29.1 % — ABNORMAL LOW (ref 39.0–52.0)
Hemoglobin: 9.9 g/dL — ABNORMAL LOW (ref 13.0–17.0)
MCH: 31.3 pg (ref 26.0–34.0)
MCHC: 34 g/dL (ref 30.0–36.0)
MCV: 92.1 fL (ref 80.0–100.0)
Platelets: 49 10*3/uL — ABNORMAL LOW (ref 150–400)
RBC: 3.16 MIL/uL — ABNORMAL LOW (ref 4.22–5.81)
RDW: 15.6 % — ABNORMAL HIGH (ref 11.5–15.5)
WBC: 17.2 10*3/uL — ABNORMAL HIGH (ref 4.0–10.5)
nRBC: 0.1 % (ref 0.0–0.2)

## 2021-01-07 LAB — HEPARIN LEVEL (UNFRACTIONATED): Heparin Unfractionated: <0.1 IU/mL — ABNORMAL LOW (ref 0.30–0.70)

## 2021-01-07 LAB — PHOSPHORUS: Phosphorus: 6.1 mg/dL — ABNORMAL HIGH (ref 2.5–4.6)

## 2021-01-07 LAB — HEMOGLOBIN A1C
Hgb A1c MFr Bld: 5.7 % — ABNORMAL HIGH (ref 4.8–5.6)
Mean Plasma Glucose: 117 mg/dL

## 2021-01-07 LAB — MAGNESIUM: Magnesium: 2 mg/dL (ref 1.7–2.4)

## 2021-01-07 LAB — TRIGLYCERIDES: Triglycerides: 158 mg/dL — ABNORMAL HIGH (ref <150)

## 2021-01-07 MED ORDER — INSULIN GLARGINE 100 UNIT/ML ~~LOC~~ SOLN
20.0000 [IU] | Freq: Every day | SUBCUTANEOUS | Status: DC
  Administered 2021-01-08: 20 [IU] via SUBCUTANEOUS
  Filled 2021-01-07 (×2): qty 0.2

## 2021-01-07 MED ORDER — INSULIN GLARGINE 100 UNIT/ML ~~LOC~~ SOLN: 15.0000 [IU] | Freq: Every day | SUBCUTANEOUS | Status: DC

## 2021-01-07 MED ORDER — LEVETIRACETAM IN NACL 500 MG/100ML IV SOLN
500.0000 mg | Freq: Two times a day (BID) | INTRAVENOUS | Status: DC
  Administered 2021-01-07 – 2021-01-11 (×9): 500 mg via INTRAVENOUS
  Filled 2021-01-07 (×9): qty 100

## 2021-01-07 MED ORDER — POTASSIUM CHLORIDE 10 MEQ/50ML IV SOLN
10.0000 meq | INTRAVENOUS | Status: AC
  Administered 2021-01-07 (×2): 10 meq via INTRAVENOUS
  Filled 2021-01-07 (×2): qty 50

## 2021-01-07 MED ORDER — LORAZEPAM 2 MG/ML IJ SOLN
2.0000 mg | Freq: Once | INTRAMUSCULAR | Status: AC
  Administered 2021-01-07: 2 mg via INTRAVENOUS
  Filled 2021-01-07: qty 1

## 2021-01-07 MED ORDER — CHLORHEXIDINE GLUCONATE 0.12 % MT SOLN
OROMUCOSAL | Status: AC
  Filled 2021-01-07: qty 15

## 2021-01-07 MED ORDER — VITAL 1.5 CAL PO LIQD
1000.0000 mL | ORAL | Status: DC
  Administered 2021-01-07 – 2021-01-09 (×3): 1000 mL
  Filled 2021-01-07 (×2): qty 1000

## 2021-01-07 MED ORDER — POTASSIUM CHLORIDE 20 MEQ PO PACK
40.0000 meq | PACK | Freq: Once | ORAL | Status: AC
  Administered 2021-01-07: 40 meq
  Filled 2021-01-07: qty 2

## 2021-01-07 MED ORDER — SODIUM CHLORIDE 0.9 % IV SOLN
2.0000 g | INTRAVENOUS | Status: DC
  Administered 2021-01-08 – 2021-01-09 (×2): 2 g via INTRAVENOUS
  Filled 2021-01-07 (×3): qty 20

## 2021-01-07 MED ORDER — POTASSIUM CHLORIDE 10 MEQ/50ML IV SOLN
10.0000 meq | INTRAVENOUS | Status: AC
  Administered 2021-01-07 (×4): 10 meq via INTRAVENOUS
  Filled 2021-01-07 (×4): qty 50

## 2021-01-07 NOTE — Progress Notes (Addendum)
Advanced Heart Failure Rounding Note  PCP-Cardiologist: No primary care provider on file.   Subjective:    Remains intubated and sedated. FiO2 40%. According to RNs woke up over the weekend and followed simple commands.   Impella down to P-6 due to suction. Flow 2.6. Waveforms ok   On NE 6 + EPI 14. Co-ox 73%.   Creatinine continues to rise, 1.7 -> 1.8 -> 2.2->2.9->3.1 but excellent UOP yesterday w/ lasix gtt + metolazone, -6.4L. Net negative 3L for the day. Urine clear.   WBC 23K -> 20->17K   Increased serous drainage noted at impella site overnight.    Swan numbers  CVP 11  PA  34/17 (25) Thermo 5.2/2.4 SVR 909 PVR 1.6    Objective:   Weight Range: 105 kg Body mass index is 30.54 kg/m.   Vital Signs:   Temp:  [96.8 F (36 C)-99.32 F (37.4 C)] 98.2 F (36.8 C) (04/18 0700) Pulse Rate:  [67-78] 73 (04/18 0700) Resp:  [15-24] 16 (04/18 0700) BP: (102-128)/(57-70) 120/65 (04/18 0700) SpO2:  [97 %-100 %] 98 % (04/18 0700) Arterial Line BP: (91-149)/(48-107) 114/60 (04/18 0700) FiO2 (%):  [40 %-80 %] 40 % (04/18 0328) Weight:  [105 kg] 105 kg (04/18 0618) Last BM Date:  (PTA)  Weight change: Filed Weights   01/05/21 0500 01/06/21 0452 01/07/21 0618  Weight: 108.9 kg 106.9 kg 105 kg    Intake/Output:   Intake/Output Summary (Last 24 hours) at 01/07/2021 0732 Last data filed at 01/07/2021 0700 Gross per 24 hour  Intake 3369.03 ml  Output 6440 ml  Net -3070.97 ml      Physical Exam    CVP 11  General:  Critically ill WM, intubated and sedated  HEENT: + ETT, normal Neck: supple. + Rt IV Swan, JVD 11 cm. Carotids 2+ bilat; no bruits. No lymphadenopathy or thyromegaly appreciated. Cor: PMI nondisplaced. Regular rate & rhythm. No rubs, gallops or murmurs. Lungs: intubated and clear Abdomen: soft, nontender, nondistended. No hepatosplenomegaly. No bruits or masses. Good bowel sounds. Extremities: no cyanosis, clubbing, rash, 2+ edema, + Lt femoral  impella site bandaged w/ serous drainage, + left femoral hematoma + Rt femoral TVP wires Neuro: intubated and sedated   Telemetry   Sinus 70s Personally reviewed  Labs    CBC Recent Labs    01/06/21 0408 01/06/21 0434 01/07/21 0334 01/07/21 0613  WBC 20.3*  --  17.2*  --   HGB 10.0*   < > 9.9* 9.5*  HCT 30.2*   < > 29.1* 28.0*  MCV 91.8  --  92.1  --   PLT 53*  --  49*  --    < > = values in this interval not displayed.   Basic Metabolic Panel Recent Labs    01/06/21 2012 01/07/21 0334 01/07/21 0613  NA 136 138 138  K 3.6 3.6 3.4*  CL 106 104  --   CO2 24 24  --   GLUCOSE 90 78  --   BUN 40* 43*  --   CREATININE 2.88* 3.14*  --   CALCIUM 7.4* 7.6*  --   MG 2.0 2.0  --   PHOS 6.0* 6.1*  --    Liver Function Tests Recent Labs    01/05/21 2028  AST 38  ALT 35  ALKPHOS 34*  BILITOT 0.6  PROT 4.4*  ALBUMIN 2.1*   No results for input(s): LIPASE, AMYLASE in the last 72 hours. Cardiac Enzymes No results for input(s): CKTOTAL,  CKMB, CKMBINDEX, TROPONINI in the last 72 hours.  BNP: BNP (last 3 results) No results for input(s): BNP in the last 8760 hours.  ProBNP (last 3 results) No results for input(s): PROBNP in the last 8760 hours.   D-Dimer No results for input(s): DDIMER in the last 72 hours. Hemoglobin A1C No results for input(s): HGBA1C in the last 72 hours. Fasting Lipid Panel Recent Labs    01/07/21 0334  TRIG 158*   Thyroid Function Tests No results for input(s): TSH, T4TOTAL, T3FREE, THYROIDAB in the last 72 hours.  Invalid input(s): FREET3  Other results:   Imaging    DG CHEST PORT 1 VIEW  Result Date: 01/06/2021 CLINICAL DATA:  Respirator dependent.  Code STEMI EXAM: PORTABLE CHEST 1 VIEW COMPARISON:  Yesterday FINDINGS: Endotracheal tube with tip just below the clavicular heads. The enteric tube tip reaches the gastric fundus. Left ventricular assist device in similar position. Swan-Ganz catheter with tip at the right main  pulmonary artery. Improved aeration although there is still persistent asymmetric hazy density on the right with appearance favoring atelectasis or pleural fluid. Milder hazy density on the left. No interstitial edema or pneumothorax. IMPRESSION: 1. Stable hardware positioning. 2. Hazy appearance of the chest likely reflecting atelectasis, improved on the right since yesterday. Electronically Signed   By: Monte Fantasia M.D.   On: 01/06/2021 09:57     Medications:     Scheduled Medications: . aspirin  81 mg Per Tube Daily  . chlorhexidine gluconate (MEDLINE KIT)  15 mL Mouth Rinse BID  . Chlorhexidine Gluconate Cloth  6 each Topical Daily  . feeding supplement (PROSource TF)  45 mL Per Tube TID  . feeding supplement (VITAL 1.5 CAL)  1,000 mL Per Tube Q24H  . insulin aspart  2 Units Subcutaneous Q4H  . insulin aspart  2-6 Units Subcutaneous Q4H  . insulin detemir  25 Units Subcutaneous Daily  . mouth rinse  15 mL Mouth Rinse 10 times per day  . metoCLOPramide (REGLAN) injection  5 mg Intravenous Q8H  . pantoprazole sodium  40 mg Per Tube Daily  . polyethylene glycol  17 g Per Tube BID  . rosuvastatin  10 mg Per Tube Daily  . sodium chloride flush  10-40 mL Intracatheter Q12H  . sodium chloride flush  3 mL Intravenous Q12H  . ticagrelor  90 mg Per Tube BID    Infusions: . sodium chloride 250 mL (01/05/21 2202)  . ceFEPime (MAXIPIME) IV    . dextrose    . epinephrine    . fentaNYL infusion INTRAVENOUS 150 mcg/hr (01/07/21 0700)  . furosemide (LASIX) 200 mg in dextrose 5% 100 mL (68m/mL) infusion 12 mg/hr (01/07/21 0700)  . impella catheter heparin 25 unit/mL in dextrose 5%    . midazolam Stopped (01/03/21 1300)  . norepinephrine (LEVOPHED) Adult infusion 6 mcg/min (01/07/21 0700)  . potassium chloride 50 mL/hr at 01/07/21 0700  . propofol (DIPRIVAN) infusion 60 mcg/kg/min (01/07/21 0700)  . [START ON 01/08/2021] vancomycin      PRN Medications: sodium chloride, sodium chloride,  acetaminophen (TYLENOL) oral liquid 160 mg/5 mL, dextrose, ondansetron (ZOFRAN) IV, sodium chloride flush, sodium chloride flush    Assessment/Plan   1. CAD/ Acute Inferior STEMI - emergent cath w/ pRCA occlusion s/p PCI + DES - Initial angiography showed sluggish flow in LAD and LCX>>improved post impella placement  - Echo LVEF 20-25%, RV moderately reduced - DAPT w/ ASA + Brilinta  - Crestor 10 per tube   2.  VF Arrest - in setting of acute MI, now s/p revascularization  - no further ectopy on tele. Amio stopped - keep K > 4.0 and Mg > 2.0  - EEG with diffuse encephalopathy but no seizure activity. Per RN staff patient followed simple commands over the weekend   3. Shock, Combination Cardiogenic + Hemorrhagic  - Acute MI + developed severe hemorrhagic shock due to bleeding at left groin Impella Transfused 4U PRBCs . hgb now stable - impella + inotrope support for cardiogenic shock (see discussion below)  4. Acute Systolic Heart Failure>>Cardiogenic Shock  - ICM Echo LVEF 20-25%, RV moderately reduced - Continue Impella at P-6. Waveforms ok.  - He is well supported on the left side with Impella. Main issue now is RV failure and marked volume overload in setting of worsening AKI (suspect ATN) - Remains on high-dose pressor support adjusting as needed. - Goal for today is to keep SBP > 100 and make him negative at least 2-3L  - continue lasix gtt at 12/hr. Defer additional metolazone to MD. - If renal function worsening or stops peeing will need to consider CVVHD - Prognosis increasingly concerning - off dig with AKI  5. Acute hypoxic respiratory failure - now on 40% FIO2 - continue to diurese aggressively - CXR improving - needs more fluid off to help wean vent - vent management per PCCM   5. AKI - due to ATN/shock - SCr 1.17 -> 1.6 -> 1.4 -> 1.7 -> 1.8 -> 2.2->2.9->3.1  - UOP good  - Keep SBP > 100 - Plan as above  6. Hypokalemia/hyperkalemia - K 3.6. supp gently -  Mg 2.0   7. Leukocytosis - WBC 23K -> 20->17K. - ? Reactive from acute MI. With increasing co-ox watch for sepsis - continue empiric abx  8. DM2 - off insulin gtt - now on levimir and SSI    Length of Stay: [address], PA-C  01/07/2021, 7:32 AM  Advanced Heart Failure Team Pager [phone] (M-F; 7a - 5p)  Please contact [facility] for night-coverage after hours (5p -7a ) and weekends on [email or web address]   Lyda Jester, PA-C  7:32 AM   Agree with above.  Remains intubated. Sedation weaned and not waking up. Epi now down to 1 and NE at 2. Good flows on Impella. Hemodynamics much improved. Vent down to 40% FiO2. Diuresing well on lasix gtt. Creatinine up slightly.   Impella position evaluated with echo.  About 4.1cm   General:  Intubated. Eyes open. Not following commands HEENT: normal + ETT Neck: supple. RIJ swan . Carotids 2+ bilat; no bruits. No lymphadenopathy or thryomegaly appreciated. Cor: PMI nondisplaced. Regular + impella hum  Lungs: coarse Abdomen: soft, nontender, nondistended. No hepatosplenomegaly. No bruits or masses. Good bowel sounds. Extremities: no cyanosis, clubbing, rash, 2+ edema LFA impella site ok with diffuse serous oozing Neuro: eyes open. Not responding. + corneals and gag  Remains critically ill but hemodynamics and respiratory status much improved. Still volume overloaded. Main question now will be if he has anoxic brain injury or not.   - Continue Impella - Continue IV diuresis - Wean inotropes as tolerated - Follow hemodynamics - Neuro to see today  VAD interrogated personally. Parameters stable.  D/w CCCM at bedside.   CRITICAL CARE Performed by: Glori Bickers  Total critical care time: 45 minutes  Critical care time was exclusive of separately billable procedures and treating other patients.  Critical care was necessary to treat or prevent imminent or  life-threatening deterioration.  Critical care was time  spent personally by me (independent of midlevel providers or residents) on the following activities: development of treatment plan with patient and/or surrogate as well as nursing, discussions with consultants, evaluation of patient's response to treatment, examination of patient, obtaining history from patient or surrogate, ordering and performing treatments and interventions, ordering and review of laboratory studies, ordering and review of radiographic studies, pulse oximetry and re-evaluation of patient's condition.  Glori Bickers, MD  11:51 AM

## 2021-01-07 NOTE — Progress Notes (Signed)
Not following commands during SAT. Twitching of mouth noted. Will get EEG and ask neuro to see. May need to get brain MRI if able with impella  Erskine Emery MD PCCM

## 2021-01-07 NOTE — Plan of Care (Signed)
  Problem: Safety: Goal: Ability to remain free from injury will improve Outcome: Progressing   Problem: Respiratory: Goal: Ability to maintain a clear airway and adequate ventilation will improve Outcome: Progressing   Problem: Skin Integrity: Goal: Risk for impaired skin integrity will decrease Outcome: Not Progressing

## 2021-01-07 NOTE — Progress Notes (Addendum)
8346 Wake up assessment start time; propofol (gtt at 60) and fentanyl (gtt at 150) cut in 1/2 1000 Propofol and fentanyl turned to off  Pt. unresponsive 1.5 hours post beginning of wake up assessment. Eyes open/not tracking; corneal reflex present. Pupils 55mm equal sluggish reactive to light. Unable to follow commands...wiggle toes/squeeze hands. No response to pain. Remains synchronous with vent. Systolic blood pressure elevated to ~200 with MAP 110 during wake up assessment. Epi and levo weaned. After assessed by provider, low dose fentanyl continued. Propofol to remain off unless tremor of lip/leg worsens; fentanyl OK to continue. Head CT ordered with 24hr EEG.

## 2021-01-07 NOTE — Consult Note (Addendum)
Neurology Consultation Reason for Consult: Concern for seizures Referring Physician: Dr. Ina Homes  CC: Chest pain, twitching of mouth  History is obtained from: Chart review as patient is currently intubated and sedated  HPI: [name] is a 70 y.o. male with past medical history of hypertension, hyperlipidemia, coronary artery disease who initially presented on 01/03/2021 with chest pain and was found to have STEMI, s/p LHC and placement of TVP for bradycardia, placement of impella, and stenting of RCA.  He had VF arrest during the procedure.  Postop course was also complicated by hemorrhagic shock due to hematoma around Impella insertion site which has since resolved.  Today patient was noted to have twitching of mouth and therefore EEG was ordered which showed generalized periodic discharges at 2.5Hz .  Therefore, Neurology was consulted.  ROS: Unable to obtain due to altered mental status.   Past Medical History:  Diagnosis Date  . Anxiety   . Arthritis   . Cancer ([place]) 2010   basal cell ca, head  . Cataract    bilateral  . Depression   . ED (erectile dysfunction)   . GERD (gastroesophageal reflux disease)   . Hyperlipidemia   . Hypertension   . Kidney stone 07-29-11   passed   . 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 Vitreous floater    left eye, sees Dr. Dawna [facility] at [facility]     Family History  Problem Relation Age of Onset  . Hypertension Mother   . Hyperlipidemia Mother   . Depression Other   . Diabetes Other   . Hyperlipidemia Other   . Hypertension Other   . Alzheimer's disease Other   . Colon cancer Neg Hx     Social History:  reports that he has never smoked. He has never used smokeless tobacco. He reports that he does not drink alcohol and does not use drugs.   Medications Prior to Admission  Medication Sig Dispense Refill Last Dose  . acetaminophen (TYLENOL) 325 MG tablet Take 650 mg by mouth every 6 (six) hours as needed for  mild pain.   unknown at unknown  . amLODipine (NORVASC) 5 MG tablet TAKE 1 TABLET BY MOUTH EVERY DAY (Patient taking differently: Take 5 mg by mouth daily.) 90 tablet 3 unknown at unknown  . cyclobenzaprine (FLEXERIL) 10 MG tablet TAKE 1 TABLET BY MOUTH AT BEDTIME (Patient taking differently: Take 10 mg by mouth at bedtime.) 90 tablet 5 unknown at unknown  . finasteride ([name]) 5 MG tablet Take 1 tablet (5 mg total) by mouth daily. 1 tablet 0 unknown at unknown  . LORazepam (ATIVAN) 2 MG tablet Take 1 tablet (2 mg total) by mouth every 6 (six) hours as needed for anxiety. 90 tablet 5 unknown at unknown  . losartan (COZAAR) 50 MG tablet TAKE 2 TABLETS BY MOUTH EVERY DAY (Patient taking differently: Take 100 mg by mouth daily.) 180 tablet 3 unknown at unknown  . magic mouthwash SOLN Take 10 mLs by mouth 3 (three) times daily as needed for mouth pain. 120 mL 0 unknown at unknown  . Melatonin 10 MG TABS Take 10 mg by mouth daily.   unknown at unknown  . omeprazole (PRILOSEC) 20 MG capsule TAKE 1 CAPSULE (20 MG TOTAL) BY MOUTH 2 (TWO) TIMES DAILY BEFORE A MEAL. 60 capsule 11 unknown at unknown  . rosuvastatin (CRESTOR) 5 MG tablet TAKE 1 TABLET BY MOUTH 2-3 TIMES WEEKLY AS TOLERATED (Patient taking differently: Take 5 mg by  mouth See admin instructions. Take 1 tablet by mouth 2-3 times weekly as tolerated) 36 tablet 3 unknown at unknown  . tamsulosin (FLOMAX) 0.4 MG CAPS capsule Take 0.4 mg by mouth daily.   unknown at unknown  . triamcinolone cream (KENALOG) 0.1 % Apply 1 application topically 2 (two) times daily as needed (For rash).   unknown at unknown  . DULoxetine (CYMBALTA) 20 MG capsule TAKE 1 CAPSULE BY MOUTH EVERY DAY (Patient taking differently: Take 20 mg by mouth daily.) 30 capsule 11 unknown at unknown  . FLUAD 0.5 ML SUSY TO BE ADMINISTERED BY PHARMACIST FOR IMMUNIZATION  0       Exam: Current vital signs: BP (!) 165/83   Pulse 78   Temp 98.06 F (36.7 C)   Resp 18   Ht 6\' 1"   (1.854 m)   Wt 105 kg   SpO2 95%   BMI 30.54 kg/m  Vital signs in last 24 hours: Temp:  [96.8 F (36 C)-99.14 F (37.3 C)] 98.06 F (36.7 C) (04/18 1200) Pulse Rate:  [67-85] 78 (04/18 1200) Resp:  [15-24] 18 (04/18 1200) BP: (102-165)/(59-83) 165/83 (04/18 1200) SpO2:  [95 %-100 %] 95 % (04/18 1200) Arterial Line BP: (91-180)/(48-107) 180/75 (04/18 1200) FiO2 (%):  [40 %] 40 % (04/18 1109) Weight:  [105 kg] 105 kg (04/18 0618)   Physical Exam  Constitutional: Appears well-developed and well-nourished.  Psych: Unable to assess due to altered mental status  HEENT: EEG leads in place, nontraumatic Cardiovascular: Normal rate and regular rhythm.  Respiratory: Intubated, coarse breath sounds bilaterally  GI: Soft.  No distension. There is no tenderness.  Skin: Warm Neuro: Sedated, comatose, eyes open spontaneously but does not look at examiner, does not follow commands, PERRLA, corneal reflex intact, gag reflex intact, flaccid, does not withdraw to noxious stimuli in upper extremities  I have reviewed labs in epic and the results pertinent to this consultation are: CBC:  Recent Labs  Lab 01/06/21 0408 01/06/21 0434 01/07/21 0334 01/07/21 0613  WBC 20.3*  --  17.2*  --   HGB 10.0*   < > 9.9* 9.5*  HCT 30.2*   < > 29.1* 28.0*  MCV 91.8  --  92.1  --   PLT 53*  --  49*  --    < > = values in this interval not displayed.    Basic Metabolic Panel:  Lab Results  Component Value Date   NA 138 01/07/2021   K 3.4 (L) 01/07/2021   CO2 24 01/07/2021   GLUCOSE 78 01/07/2021   BUN 43 (H) 01/07/2021   CREATININE 3.14 (H) 01/07/2021   CALCIUM 7.6 (L) 01/07/2021   GFRNONAA 21 (L) 01/07/2021   GFRAA 81 10/25/2019   Lipid Panel:  Lab Results  Component Value Date   LDLCALC 71 05/24/2020   HgbA1c:  Lab Results  Component Value Date   HGBA1C 5.7 (H) 01/06/2021   Urine Drug Screen: No results found for: LABOPIA, COCAINSCRNUR, LABBENZ, AMPHETMU, THCU, LABBARB  Alcohol Level  No results found for: ETH   I have reviewed the images obtained:  Chest x-ray 01/06/2021: Stable hardware positioning.  Hazy appearance of chest mildly reflecting atelectasis improving on the right since previously.  ASSESSMENT/PLAN: 70 year old male presented with chest pain who initially presented on 01/03/2021 with chest pain and was found to have STEMI, s/p LHC and placement of TVP for bradycardia, placement of impella, and stenting of RCA.  Postop course was complicated by V. fib arrest and hemorrhagic  shock at the site of Impella insertion.  Today patient will need to have twitching of mouth.  Seizure-like episode Acute encephalopathy -Routine EEG showed generalized periodic discharges with triphasic morphology at 2.5 to 3 Hz which is nonspecific but can be seen due to encephalopathy, anoxic/hypoxic brain injury  Recommendations: -We will give IV Ativan 2 mg once -We will order LTM eeg monitoring for characterization of spells and medication adjustment - Will start patient on Keppra 500 mg twice daily -We will discussed with ICU team to switch cefepime to different antibiotic due to concern for cefepime toxicity in the setting of renal dysfunction -We will obtain CT head without contrast to look for acute abnormality -After LTM EEG, if needed will consider MRI brain without contrast  -Continue seizure precautions -Will not resume propofol/Versed at this point unless twitching starts spreading or becomes more severe -As needed IV Ativan 2 mg for clinical seizure-like activity lasting more than 5 minutes.  Please call neuro if needed. -Discussed my plan with patient bedside nurse and Dr. Tamala Julian  Thank you for allowing [place] to participate in the care of this patient. If you have any further questions, please contact  me or neurohospitalist.    CRITICAL CARE Performed by: Lora Havens   Total critical care time: 40 minutes  Critical care time was exclusive of separately billable  procedures and treating other patients.  Critical care was necessary to treat or prevent imminent or life-threatening deterioration.  Critical care was time spent personally by me on the following activities: development of treatment plan with patient and/or surrogate as well as nursing, discussions with consultants, evaluation of patient's response to treatment, examination of patient, obtaining history from patient or surrogate, ordering and performing treatments and interventions, ordering and review of laboratory studies, ordering and review of radiographic studies, pulse oximetry and re-evaluation of patient's condition.   [name] Epilepsy [facility] neurohospitalist

## 2021-01-07 NOTE — Progress Notes (Signed)
ANTICOAGULATION CONSULT NOTE - Follow Up Consult  Pharmacy Consult for heparin Indication: Impella   Allergies  Allergen Reactions  . Codeine Other (See Comments)    hallucinations  . Lisinopril Cough    Patient Measurements: Height: 6\' 1"  (185.4 cm) Weight: 105 kg (231 lb 7.7 oz) IBW/kg (Calculated) : 79.9   Vital Signs: Temp: 98.2 F (36.8 C) (04/18 0700) Temp Source: Core (04/18 0400) BP: 120/65 (04/18 0700) Pulse Rate: 73 (04/18 0700)  Labs: Recent Labs    01/05/21 0400 01/05/21 0504 01/06/21 0408 01/06/21 0434 01/06/21 0919 01/06/21 2012 01/07/21 0334 01/07/21 0613  HGB 11.0*   < > 10.0*   < > 9.5*  --  9.9* 9.5*  HCT 31.6*   < > 30.2*   < > 28.0*  --  29.1* 28.0*  PLT 70*  --  53*  --   --   --  49*  --   HEPARINUNFRC <0.10*  --  <0.10*  --   --   --  <0.10*  --   CREATININE 1.45*   < > 2.23*  --   --  2.88* 3.14*  --    < > = values in this interval not displayed.    Estimated Creatinine Clearance: 27.8 mL/min (A) (by C-G formula based on SCr of 3.14 mg/dL (H)).   Medical History: Past Medical History:  Diagnosis Date  . Anxiety   . Arthritis   . Cancer ([place]) 2010   basal cell ca, head  . Cataract    bilateral  . Depression   . ED (erectile dysfunction)   . GERD (gastroesophageal reflux disease)   . Hyperlipidemia   . Hypertension   . Kidney stone 07-29-11   passed   . 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 Vitreous floater    left eye, sees [facility]. Dawna [facility] at [facility]      Assessment: 71yom admitted for STEMI s/p VF arrest shock and impella + DES to RCA. Groin bleeding after cangrelor used prior to OG being placed and ticagrelor given cangrelor now off, PRBC x4 given and groin stable.   Heparin level currently undetectable as expected, hgb stable 9.5, plt continue to trend down to 49. LDH stable 344.   Previous plan was to increase heparin in purge solution to 50uts/ml at next bag exchange but patient has had  continued oozing at impella. Will follow up changing to full dose purge and possible systemic heparin in am.      Goal of Therapy:  Monitor platelets by anticoagulation protocol: Yes   Plan:  Continue low dose heparin purge today Follow up CBC and daily heparin level  Erin Hearing PharmD., BCPS Clinical Pharmacist 01/07/2021 7:36 AM    Please check AMION for all [place] phone numbers After 10:00 PM, call [place] [phone]

## 2021-01-07 NOTE — Progress Notes (Signed)
LTM EEG started following spot EEG.

## 2021-01-07 NOTE — Progress Notes (Signed)
[facility] Progress Note Patient Name: Desman Polak DOB: 1950-12-07 MRN: [id number]   Date of Service  01/07/2021  HPI/Events of Note  K+ 3.6  eICU Interventions  KCL 10 meq iv Q 1 hour  X 4 doses ordered.     Intervention Category Intermediate Interventions: Electrolyte abnormality - evaluation and management  Frederik Pear 01/07/2021, 6:42 AM

## 2021-01-07 NOTE — Progress Notes (Signed)
NAME:  [name], MRN:  [id number], DOB:  1950-11-01, LOS: 4 ADMISSION DATE:  01/03/2021, CONSULTATION DATE:  01/03/2021 REFERRING MD:  Dr. Haroldine Laws, CHIEF COMPLAINT:  STEMI/ cardiogenic shock  History of Present Illness:  HPI obtained from medical chart review as patient is currently intubated and sedated on mechanical ventilation.  70 year old male with prior history of hypertension, hyperlipidemia, and CAD presenting by EMS with sudden onset of chest pain found to have inferior STEMI with bradycardia and hypotension.  Taken emergently to Cath Lab on arrival.  A temporary pacer was placed with good capture, but then developed profound hypotension decompensating to cardiac arrest, requiring intubation by anesthesia, CPR, episodes of V. fib requiring defibrillation and amiodarone.  Found to have total proximal RCA occlusion and underwent PCI to the RCA with stent placement.  Procedure further complicated by cardiogenic shock requiring placement of Impella device complicated by bleeding secondary to cangrelor with development of bleeding and hematoma at puncture sites, Hgb 12.6-> 7.5,  requiring emergent transfusion of 4 units PRBC and stopping of cangrelor with stabilization of hemodynamics but remains on NE, epi, and vasopressin drips.  Patient returns to ICU intubated and sedated in cardiogenic shock with TVP and impella.  Unable to place aline in cath lab secondary to bleeding.  Labs pending.  PCCM consulted for vent management.   Pertinent  Medical History  HTN, HLD, CAD  Significant Hospital Events: Including procedures, antibiotic start and stop dates in addition to other pertinent events   . 4/14 admitted Cards with inferior STEMI-> bradycardia s/p TVP, cardiogenic shock and cardiac arrest s/p impella with PCI/stent to RCA, complicated by bleeding s/p 4 units PRBC thought 2/2 cangrelor, pending coags.  Has R IJ PA cath, TVP R groin, impella L groin . 4/15> remains on impella, weaning  pressors and inotropes. Started diuresis due to significantly increased oxygen requirements.  Interim History / Subjective:  No events. Remains intubated, sedated, impella at p6 on lasix gtt and inotropes. Did not do SAT yesterday for some reason. CVPs running around 11 No wedge  Objective   Blood pressure 119/64, pulse 72, temperature 98.1 F (36.7 C), resp. rate 16, height 6\' 1"  (1.854 m), weight 105 kg, SpO2 98 %. PAP: (24-92)/(11-78) 35/18 CVP:  [4 mmHg-15 mmHg] 11 mmHg PCWP:  [15 mmHg] 15 mmHg CO:  [5 L/min-6.9 L/min] 5.4 L/min CI:  [2.4 L/min/m2-3.3 L/min/m2] 2.6 L/min/m2  Vent Mode: PRVC FiO2 (%):  [40 %-60 %] 40 % Set Rate:  [16 bmp-18 bmp] 16 bmp Vt Set:  [640 mL] 640 mL PEEP:  [10 cmH20-15 cmH20] 10 cmH20 Plateau Pressure:  [20 cmH20-25 cmH20] 24 cmH20   Intake/Output Summary (Last 24 hours) at 01/07/2021 0854 Last data filed at 01/07/2021 0800 Gross per 24 hour  Intake 3413.1 ml  Output 6720 ml  Net -3306.9 ml   Filed Weights   01/05/21 0500 01/06/21 0452 01/07/21 0618  Weight: 108.9 kg 106.9 kg 105 kg    Examination: Constitutional: ill appearing man on vent  Eyes: pupils pinpoint, reactive, equal Ears, nose, mouth, and throat: ETT in place, minimal secretions Cardiovascular: RRR, ext warm Respiratory: scattered crackles, triggering vent Gastrointestinal: soft, hypoactive Skin: pale, bruising over left shoulder, scrotal bruising, L groin impella site leaking serous fluid, dressed Neurologic: currently heavily sedated Psychiatric: cannot assess   Labs/imaging that I have personally reviewed  (right click and "Reselect all SmartList Selections" daily)  ABG looks okay K a bit low BUN/Cr creeping up Co-ox WNL but wonder about accuracy  given variation over past several days Suspect layering R effusion on CXR, mild ongoing edema  Resolved Hospital Problem list   Hemorrhagic shock Lactic acidosis  Assessment & Plan:   Inferior STEMI s/p PCI/ stent to  main dominant RCA requiring mechanical and inotropic support Cardiogenic shock due to ischemic cardiomyopathy Vfib cardiac arrest  Bradycardia s/p TVP; now intrinsically pacing P:  - Impella, inotrope wean per cardiology - Continue diuretic drip per cardiology, will change BMP checks to q12h - Continue DAPT  Acute hypoxic respiratory failure, acute cardiogenic pulmonary edema - Continue vent support - Will discuss timing of any potential weans with CHF team   Hemorrhagic shock> resolved, hematoma around Impella insertion site due to cangrelor infusion requiring during LHC. Hematoma and H/H stable. Acute blood loss anemia Thrombocytopenia- multifactorial from bleeding, impella associated hemolysis, critical illness - Trend  AKI-worsening Hyperkalemia, resolved Hypocalcemia hyperphosphatemia - Per CHF team - Replete electrolytes PRN  Constipation- bowel regimen being strengthened  Leukocytosis, fever yesterday - 7 days reasonable, fever curve improving  At risk for malnutrition DM with Hypoglycemia -Decrease lantus to daily 20 units, SSI; change PRN - Increase TF from trickle to full, watch CBG   Best practice (right click and "Reselect all SmartList Selections" daily)  Diet:  Tube Feed  Pain/Anxiety/Delirium protocol (if indicated): Yes (RASS goal -4,-5) VAP protocol (if indicated): Yes DVT prophylaxis: Systemic AC GI prophylaxis: PPI Glucose control:  SSI Yes and Basal insulin Yes Central venous access:  Yes, and it is still needed Arterial line:  Yes, and it is still needed Foley:  Yes, and it is still needed Mobility:  bed rest  PT consulted: N/A Last date of multidisciplinary goals of care discussion [4/14 per cardiology ] Code Status:  full code Disposition: ICU    Patient critically ill due to respiratory failure, shock Interventions to address this today vent titration Risk of deterioration without these interventions is high  I personally spent 34  minutes providing critical care not including any separately billable procedures  Erskine Emery MD [facility]  Prefer epic messenger for cross cover needs If after hours, please call E-link

## 2021-01-07 NOTE — Progress Notes (Addendum)
Inpatient Diabetes Program Recommendations  AACE/ADA: New Consensus Statement on Inpatient Glycemic Control (2015)  Target Ranges:  Prepandial:   less than 140 mg/dL      Peak postprandial:   less than 180 mg/dL (1-2 hours)      Critically ill patients:  140 - 180 mg/dL   Lab Results  Component Value Date   GLUCAP 88 01/07/2021   HGBA1C 5.5 05/24/2020    Review of Glycemic Control Results for TEDD, COTTRILL (MRN [id number]) as of 01/07/2021 07:35  Ref. Range 01/06/2021 08:17 01/06/2021 11:55 01/06/2021 16:17 01/06/2021 18:22 01/06/2021 20:10 01/07/2021 02:05 01/07/2021 03:44  Glucose-Capillary Latest Ref Range: 70 - 99 mg/dL 76 87 55 (L) 112 (H) 93 86 88   Diabetes history: No hx DM Current orders for Inpatient glycemic control: Levemir 25 units   Inpatient Diabetes Program Recommendations:   Fasting CBG 76. -Decrease Levemir to 20 units qd -D/C tube feed coverage -Add A1c to labs to determine prehospital glycemic control  Thank you, Bethena Roys E. Rodriques Badie, RN, MSN, CDE  Diabetes Coordinator Inpatient Glycemic Control Team Team Pager [phone] (8am-5pm) 01/07/2021 7:41 AM

## 2021-01-07 NOTE — Procedures (Signed)
Patient Name: [name]  MRN: [id number]  Epilepsy Attending: Lora Havens  Referring Physician/Provider: Dr. Ina Homes Date: 01/07/2021 Duration: 24.33 mins  Patient history: 70 year old male with seizure-like episode.  EEG to evaluate for seizures.  Level of alertness: Lethargic  AEDs during EEG study: None  Technical aspects: This EEG study was done with scalp electrodes positioned according to the 10-20 International system of electrode placement. Electrical activity was acquired at a sampling rate of 500Hz  and reviewed with a high frequency filter of 70Hz  and a low frequency filter of 1Hz . EEG data were recorded continuously and digitally stored.   Description: EEG showed continuous generalized 3 to 5 Hz theta-delta slowing. Generalized periodic discharges with triphasic morphology at 2.5-3 Hz were also noted.  Patient was also noted to have subtle chin/mouth tremor during the EEG.  Concomitant EEG before, during and after the event continue to show generalized periodic discharges at 2.5 to 3 Hz.  Hyperventilation and photic stimulation were not performed.     ABNORMALITY - Periodic discharges with triphasic morphology, generalized ( GPDs) - Continuous slow, generalized  IMPRESSION: This study showed generalized periodic discharges with triphasic morphology at 2.5 to 3 Hz which is  on the ictal-interictal continuum with moderate to high potential for seizures.  Additionally there is evidence of moderate to severe diffuse encephalopathy, nonspecific etiology could be secondary to cefepime toxicity, anoxic /hypoxic brain injury.    Patient was noted to have subtle chin/mouth tremor during the EEG without concomitant EEG change.  However focal motor seizures may not be seen on scalp EEG.  Differentials also include myoclonus.  Recommend clinical correlation.  Edie Vallandingham Barbra Sarks

## 2021-01-08 ENCOUNTER — Inpatient Hospital Stay (HOSPITAL_COMMUNITY): Payer: Medicare PPO

## 2021-01-08 DIAGNOSIS — R570 Cardiogenic shock: Secondary | ICD-10-CM | POA: Diagnosis not present

## 2021-01-08 DIAGNOSIS — Z95811 Presence of heart assist device: Secondary | ICD-10-CM | POA: Diagnosis not present

## 2021-01-08 DIAGNOSIS — R569 Unspecified convulsions: Secondary | ICD-10-CM | POA: Diagnosis not present

## 2021-01-08 LAB — BASIC METABOLIC PANEL
Anion gap: 14 (ref 5–15)
Anion gap: 15 (ref 5–15)
Anion gap: 15 (ref 5–15)
BUN: 51 mg/dL — ABNORMAL HIGH (ref 8–23)
BUN: 54 mg/dL — ABNORMAL HIGH (ref 8–23)
BUN: 60 mg/dL — ABNORMAL HIGH (ref 8–23)
CO2: 24 mmol/L (ref 22–32)
CO2: 26 mmol/L (ref 22–32)
CO2: 27 mmol/L (ref 22–32)
Calcium: 8 mg/dL — ABNORMAL LOW (ref 8.9–10.3)
Calcium: 8.2 mg/dL — ABNORMAL LOW (ref 8.9–10.3)
Calcium: 7.9 mg/dL — ABNORMAL LOW (ref 8.9–10.3)
Chloride: 100 mmol/L (ref 98–111)
Chloride: 98 mmol/L (ref 98–111)
Chloride: 97 mmol/L — ABNORMAL LOW (ref 98–111)
Creatinine, Ser: 3.69 mg/dL — ABNORMAL HIGH (ref 0.61–1.24)
Creatinine, Ser: 3.92 mg/dL — ABNORMAL HIGH (ref 0.61–1.24)
Creatinine, Ser: 3.95 mg/dL — ABNORMAL HIGH (ref 0.61–1.24)
GFR, Estimated: 17 mL/min — ABNORMAL LOW (ref >60)
GFR, Estimated: 16 mL/min — ABNORMAL LOW (ref >60)
GFR, Estimated: 16 mL/min — ABNORMAL LOW (ref >60)
Glucose, Bld: 123 mg/dL — ABNORMAL HIGH (ref 70–99)
Glucose, Bld: 167 mg/dL — ABNORMAL HIGH (ref 70–99)
Glucose, Bld: 174 mg/dL — ABNORMAL HIGH (ref 70–99)
Potassium: 3.5 mmol/L (ref 3.5–5.1)
Potassium: 3.5 mmol/L (ref 3.5–5.1)
Potassium: 3.2 mmol/L — ABNORMAL LOW (ref 3.5–5.1)
Sodium: 138 mmol/L (ref 135–145)
Sodium: 139 mmol/L (ref 135–145)
Sodium: 139 mmol/L (ref 135–145)

## 2021-01-08 LAB — POCT I-STAT 7, (LYTES, BLD GAS, ICA,H+H)
Acid-Base Excess: 2 mmol/L (ref 0.0–2.0)
Bicarbonate: 26 mmol/L (ref 20.0–28.0)
Calcium, Ion: 1.09 mmol/L — ABNORMAL LOW (ref 1.15–1.40)
HCT: 32 % — ABNORMAL LOW (ref 39.0–52.0)
Hemoglobin: 10.9 g/dL — ABNORMAL LOW (ref 13.0–17.0)
O2 Saturation: 95 %
Patient temperature: 37.2
Potassium: 3.5 mmol/L (ref 3.5–5.1)
Sodium: 138 mmol/L (ref 135–145)
TCO2: 27 mmol/L (ref 22–32)
pCO2 arterial: 36.4 mmHg (ref 32.0–48.0)
pH, Arterial: 7.463 — ABNORMAL HIGH (ref 7.350–7.450)
pO2, Arterial: 74 mmHg — ABNORMAL LOW (ref 83.0–108.0)

## 2021-01-08 LAB — COMPREHENSIVE METABOLIC PANEL
ALT: 28 U/L (ref 0–44)
AST: 34 U/L (ref 15–41)
Albumin: 1.9 g/dL — ABNORMAL LOW (ref 3.5–5.0)
Alkaline Phosphatase: 104 U/L (ref 38–126)
Anion gap: 12 (ref 5–15)
BUN: 52 mg/dL — ABNORMAL HIGH (ref 8–23)
CO2: 26 mmol/L (ref 22–32)
Calcium: 8 mg/dL — ABNORMAL LOW (ref 8.9–10.3)
Chloride: 101 mmol/L (ref 98–111)
Creatinine, Ser: 3.75 mg/dL — ABNORMAL HIGH (ref 0.61–1.24)
GFR, Estimated: 17 mL/min — ABNORMAL LOW (ref >60)
Glucose, Bld: 135 mg/dL — ABNORMAL HIGH (ref 70–99)
Potassium: 3.6 mmol/L (ref 3.5–5.1)
Sodium: 139 mmol/L (ref 135–145)
Total Bilirubin: 1 mg/dL (ref 0.3–1.2)
Total Protein: 5.5 g/dL — ABNORMAL LOW (ref 6.5–8.1)

## 2021-01-08 LAB — CBC
HCT: 33.1 % — ABNORMAL LOW (ref 39.0–52.0)
Hemoglobin: 10.9 g/dL — ABNORMAL LOW (ref 13.0–17.0)
MCH: 29.9 pg (ref 26.0–34.0)
MCHC: 32.9 g/dL (ref 30.0–36.0)
MCV: 90.7 fL (ref 80.0–100.0)
Platelets: 61 10*3/uL — ABNORMAL LOW (ref 150–400)
RBC: 3.65 MIL/uL — ABNORMAL LOW (ref 4.22–5.81)
RDW: 15.4 % (ref 11.5–15.5)
WBC: 13.5 10*3/uL — ABNORMAL HIGH (ref 4.0–10.5)
nRBC: 0.1 % (ref 0.0–0.2)

## 2021-01-08 LAB — GLUCOSE, CAPILLARY
Glucose-Capillary: 118 mg/dL — ABNORMAL HIGH (ref 70–99)
Glucose-Capillary: 129 mg/dL — ABNORMAL HIGH (ref 70–99)
Glucose-Capillary: 131 mg/dL — ABNORMAL HIGH (ref 70–99)
Glucose-Capillary: 135 mg/dL — ABNORMAL HIGH (ref 70–99)
Glucose-Capillary: 177 mg/dL — ABNORMAL HIGH (ref 70–99)
Glucose-Capillary: 181 mg/dL — ABNORMAL HIGH (ref 70–99)
Glucose-Capillary: 185 mg/dL — ABNORMAL HIGH (ref 70–99)

## 2021-01-08 LAB — MAGNESIUM
Magnesium: 2 mg/dL (ref 1.7–2.4)
Magnesium: 2 mg/dL (ref 1.7–2.4)

## 2021-01-08 LAB — PHOSPHORUS: Phosphorus: 7 mg/dL — ABNORMAL HIGH (ref 2.5–4.6)

## 2021-01-08 LAB — COOXEMETRY PANEL
Carboxyhemoglobin: 1 % (ref 0.5–1.5)
Methemoglobin: 1.2 % (ref 0.0–1.5)
O2 Saturation: 74.1 %
Total hemoglobin: 11 g/dL — ABNORMAL LOW (ref 12.0–16.0)

## 2021-01-08 LAB — HEPARIN LEVEL (UNFRACTIONATED): Heparin Unfractionated: <0.1 IU/mL — ABNORMAL LOW (ref 0.30–0.70)

## 2021-01-08 LAB — TRIGLYCERIDES: Triglycerides: 140 mg/dL (ref <150)

## 2021-01-08 LAB — LACTATE DEHYDROGENASE: LDH: 404 U/L — ABNORMAL HIGH (ref 98–192)

## 2021-01-08 MED ORDER — POTASSIUM CHLORIDE 20 MEQ PO PACK
40.0000 meq | PACK | Freq: Once | ORAL | Status: AC
  Administered 2021-01-08: 40 meq
  Filled 2021-01-08: qty 2

## 2021-01-08 MED ORDER — HEPARIN SODIUM (PORCINE) 5000 UNIT/ML IJ SOLN
INTRAVENOUS | Status: DC
  Filled 2021-01-08: qty 10

## 2021-01-08 NOTE — Progress Notes (Signed)
NAME:  Calven Gilkes, MRN:  [id number], DOB:  03-04-1951, LOS: 5 ADMISSION DATE:  01/03/2021, CONSULTATION DATE:  01/03/2021 REFERRING MD:  Dr. Haroldine Laws, CHIEF COMPLAINT:  STEMI/ cardiogenic shock  History of Present Illness:   70 year old male with PMHx HTN, HLD, CAD presenting via EMS with sudden onset of CP, found to have inferior STEMI with bradycardia and hypotension.  Taken emergently to Cath Lab on arrival.  Temporary pacer was placed with good capture; however, patient then developed profound hypotension decompensating into cardiac arrest, requiring intubation by anesthesia and CPR with episodes of VF requiring defibrillation and amiodarone. Patient was found to have total proximal RCA occlusion and underwent PCI to the RCA with stent placement.  Procedure was further c/b cardiogenic shock (requiring placement of Impella device) c/b bleeding secondary to cangrelor with development of bleeding and hematoma at puncture sites, Hgb dropped from 12.6 to 7.5 requiring emergent transfusion of 4U PRBCs and cangrelor discontinuation. Patient required vasopressors on initial presentation to ICU. Patient returned to ICU intubated and sedated in cardiogenic shock with TVP and Impella. PCCM consulted for vent management.   HPI obtained from medical chart review as patient is currently intubated and sedated on mechanical ventilation.  Pertinent  Medical History  HTN, HLD, CAD  Significant Hospital Events: Including procedures, antibiotic start and stop dates in addition to other pertinent events   . 4/14 Admitted Cards with inferior STEMI-> bradycardia s/p TVP, cardiogenic shock and cardiac arrest s/p impella with PCI/stent to RCA, complicated by bleeding s/p 4 units PRBC thought 2/2 cangrelor, pending coags.  Has R IJ PA cath, TVP R groin, impella L groin . 4/15 Remains on impella, weaning pressors and inotropes. Started diuresis due to significantly increased oxygen requirements. . 4/18 Concern for  seizure-like activity, Neuro consult, LTM/EEG, CT Head NAICA/no evidence of edema. Transitioned to ceftriaxone from cefepime. . 4/19 Remains on Impella, off pressors. Continuing diuresis with hopeful discontinuation of Impella, allowing for MRI 4/20-4/21. Continuing LTM EEG, given facial twitching/unclear neurologic status.   Interim History / Subjective:  No significant events overnight. Remains intubated/sedated, settings stable Opening eyes and blinking intermittently, not tracking/following commands LTM in place Impella remains at P6 Goal further diuresis today with hopeful Impella removal to allow for MRI CVP running 8-10  Objective   Blood pressure (!) 151/82, pulse 93, temperature 98.6 F (37 C), temperature source Core, resp. rate 16, height 6\' 1"  (1.854 m), weight 102.2 kg, SpO2 97 %. PAP: (24-39)/(9-21) 24/9 CVP:  [7 mmHg-16 mmHg] 8 mmHg CO:  [7.2 L/min-9.9 L/min] 8.9 L/min CI:  [3.4 L/min/m2-4.7 L/min/m2] 4.2 L/min/m2  Vent Mode: PRVC FiO2 (%):  [40 %-50 %] 40 % Set Rate:  [16 bmp] 16 bmp Vt Set:  [640 mL] 640 mL PEEP:  [10 cmH20] 10 cmH20 Plateau Pressure:  [20 cmH20-24 cmH20] 22 cmH20   Intake/Output Summary (Last 24 hours) at 01/08/2021 0855 Last data filed at 01/08/2021 0800 Gross per 24 hour  Intake 2738.9 ml  Output 7300 ml  Net -4561.1 ml   Filed Weights   01/06/21 0452 01/07/21 0618 01/08/21 0500  Weight: 106.9 kg 105 kg 102.2 kg   Physical Examination: General: Chronically ill-appearing elderly male in NAD. HEENT: [place]/AT, anicteric sclera, PERRL 70mm, moist mucous membranes. Neuro: Awake on vent, not responsive and unable to assess orientation. Opening eyes/blinking, not tracking. Does not respond to verbal, tactile or noxious stimuli. Not following commands. +Cough and +Gag  CV: RRR, no m/g/r. PULM: Breathing even and unlabored on vent (PEEP  10, FiO2 40%. Lung fields with scattered crackles bilaterally. GI: Soft, nontender, mildly distended. Hypoactive  bowel sounds. Extremities: Trace BLE edema noted. Bilateral femoral sites with dressings intact. Skin: Warm/dry, ecchymosis noted over L shoulder/upper chest. Significant scrotal ecchymosis noted.  Labs/imaging that I have personally reviewed: (right click and "Reselect all SmartList Selections" daily)  WBC downtrending 13.5 (17.2) H&H stable Uptrending Cr (3.75 from 2.8 2 days prior) Stable LFTs Co-ox 74% CXR with "tiny bilateral pleural effusions", mild R atelectasis  Resolved Hospital Problem list   Hemorrhagic shock Lactic acidosis  Assessment & Plan:   Inferior STEMI s/p PCI/ stent to main dominant RCA requiring mechanical and inotropic support Cardiogenic shock due to ischemic cardiomyopathy Vfib cardiac arrest  Bradycardia s/p TVP; now intrinsically pacing - HF team primary, appreciate assistance with management - Impella management per HF - Continue Lasix gtt, plan for additional diuresis today 4/19 and hopeful Impella discontinuation 4/20 for MRI - Off pressors today - Continue DAPT  Acute hypoxic respiratory failure, acute cardiogenic pulmonary edema - Continue full vent support (6-8cc/kg IBW) - Wean FiO2 for O2 sat > 90% - Daily WUA/SBT - VAP bundle - Pulmonary hygiene - PAD protocol for sedation: Propofol for goal RASS 0 to -1, discontinue Fentanyl - Discuss timing of any potential weaning with CHF team  Seizure-like activity Concern for seizure-like activity noted 4/18 with facial/mouth twitching. EEG demonstrating "periodic discharges with triphasic morphology with moderate to high potential for seizures; e/o moderate to severe diffuse encephalopathy, nonspecific but could be 2/2 cefepime vs. anoxic/hypoxic brain injury". CT Head NAICA and no e/o edema. - Neuro consulted, appreciate assistance - LTM EEG in place - Continue Keppra - Ativan PRN - Cefepime discontinued, transitioned to ceftriaxone - Continue seizure precautions - Neuroprotective measures: HOB  > 30 degrees, normoglycemia, normothermia, electrolytes WNL - Plan to attempt MRI once Impella discontinued  Hemorrhagic shock, resolved, hematoma around Impella insertion site due to cangrelor infusion requiring during LHC. Hematoma and H/H remain stable. Acute blood loss anemia Thrombocytopenia Multifactorial from bleeding, impella associated hemolysis, critical illness - Trend H&H - Monitor for signs of active bleeding  AKI-worsening Hyperkalemia, resolved Hypocalcemia Hyperphosphatemia - Management per CHF team - Trend BMP - Replete electrolytes as indicated - Monitor I&Os - Avoid nephrotoxic agents as able  Constipation - Continue aggressive bowel regimen  Leukocytosis, fever yesterday - No fevers last 24H - Continue Rocephin  At risk for malnutrition DM with Hypoglycemia - Continue Lantus 20U daily - SSI PRN - Continue TF - Monitor CBG, goal 140-180  Best practice (right click and "Reselect all SmartList Selections" daily)  Diet:  Tube Feed  Pain/Anxiety/Delirium protocol (if indicated): Yes (RASS goal -1,-2) VAP protocol (if indicated): Yes DVT prophylaxis: Systemic AC GI prophylaxis: PPI Glucose control:  SSI Yes and Basal insulin Yes Central venous access:  Yes, and it is still needed Arterial line:  Yes, and it is still needed Foley:  Yes, and it is still needed Mobility:  bed rest  PT consulted: N/A Last date of multidisciplinary goals of care discussion [4/14 per cardiology ] Code Status:  full code Disposition: ICU   Critical care time: 43 minutes   Lestine Mount, PA-C [facility] 01/08/21 9:33 AM  Please see [email or web address] for pager details.

## 2021-01-08 NOTE — Progress Notes (Signed)
ANTICOAGULATION CONSULT NOTE - Follow Up Consult  Pharmacy Consult for heparin Indication: Impella   Allergies  Allergen Reactions  . Codeine Other (See Comments)    hallucinations  . Lisinopril Cough    Patient Measurements: Height: 6\' 1"  (185.4 cm) Weight: 102.2 kg (225 lb 5 oz) IBW/kg (Calculated) : 79.9   Vital Signs: Temp: 98.6 F (37 C) (04/19 0718) Temp Source: Core (04/19 0400) BP: 164/74 (04/19 0700) Pulse Rate: 96 (04/19 0718)  Labs: Recent Labs    01/06/21 0408 01/06/21 0434 01/07/21 0334 01/07/21 0613 01/07/21 1148 01/08/21 0032 01/08/21 0035 01/08/21 0341  HGB 10.0*   < > 9.9* 9.5*  --   --  10.9* 10.9*  HCT 30.2*   < > 29.1* 28.0*  --   --  32.0* 33.1*  PLT 53*  --  49*  --   --   --   --  61*  HEPARINUNFRC <0.10*  --  <0.10*  --   --   --   --  <0.10*  CREATININE 2.23*   < > 3.14*  --  3.40* 3.69*  --  3.75*   < > = values in this interval not displayed.    Estimated Creatinine Clearance: 23 mL/min (A) (by C-G formula based on SCr of 3.75 mg/dL (H)).   Medical History: Past Medical History:  Diagnosis Date  . Anxiety   . Arthritis   . Cancer ([place]) 2010   basal cell ca, head  . Cataract    bilateral  . Depression   . ED (erectile dysfunction)   . GERD (gastroesophageal reflux disease)   . Hyperlipidemia   . Hypertension   . Kidney stone 07-29-11   passed   . 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 Vitreous floater    left eye, sees Dr. Dawna Part at [facility]      Assessment: 24yom admitted for STEMI s/p VF arrest shock and impella + DES to RCA. Groin bleeding after cangrelor used prior to OG being placed and ticagrelor given cangrelor now off, PRBC x4 given and groin stable.   Heparin level currently undetectable as expected, hgb up to 10s, plt now trending up to 61. LDH trending up 400s. No issues noted with impella flow at P6.    Still oozing, nursing reports changing dressings a couple times overnight but  seems to be slowly improving, d/w cardiology and will cautiously advance impella purge to full strength.      Goal of Therapy:  Monitor platelets by anticoagulation protocol: Yes   Plan:  Increase to full dose heparin purge today Plan to possibly pull pump in am  Erin Hearing PharmD., BCPS Clinical Pharmacist 01/08/2021 8:09 AM  Please check AMION for all [place] phone numbers After 10:00 PM, call [place] [phone]

## 2021-01-08 NOTE — Progress Notes (Signed)
Subjective: No acute events overnight.  Continues to have left corner of the mouth twitching.    ROS: Unable to obtain due to poor mental status  Examination  Vital signs in last 24 hours: Temp:  [98.24 F (36.8 C)-98.96 F (37.2 C)] 98.78 F (37.1 C) (04/19 1200) Pulse Rate:  [78-100] 92 (04/19 1200) Resp:  [14-19] 16 (04/19 1200) BP: (135-169)/(66-82) 152/78 (04/19 1200) SpO2:  [96 %-100 %] 98 % (04/19 1200) Arterial Line BP: (133-168)/(61-79) 142/66 (04/19 1200) FiO2 (%):  [40 %-50 %] 40 % (04/19 1130) Weight:  [102.2 kg] 102.2 kg (04/19 0500)  General: lying in bed, not in apparent distress CVS: pulse-normal rate and rhythm RS: Intubated, coarse breath sounds bilaterally Extremities: normal, warm Neuro: eyes open spontaneously but does not look at examiner, does not follow commands, PERRLA, corneal reflex intact, gag reflex intact, flaccid, does not withdraw to noxious stimuli in all extremities  Basic Metabolic Panel: Recent Labs  Lab 01/05/21 2028 01/06/21 0408 01/06/21 0434 01/06/21 2012 01/07/21 0334 01/07/21 0613 01/07/21 1148 01/08/21 0032 01/08/21 0035 01/08/21 0341  NA 137 138   < > 136 138 138 138 138 138 139  K 3.6 3.6   < > 3.6 3.6 3.4* 3.6 3.5 3.5 3.6  CL 107 106  --  106 104  --  101 100  --  101  CO2 23 23  --  24 24  --  26 24  --  26  GLUCOSE 117* 116*  --  90 78  --  75 123*  --  135*  BUN 31* 35*  --  40* 43*  --  44* 51*  --  52*  CREATININE 1.85* 2.23*  --  2.88* 3.14*  --  3.40* 3.69*  --  3.75*  CALCIUM 6.7* 7.0*  --  7.4* 7.6*  --  8.1* 8.0*  --  8.0*  MG 2.1 2.0  --  2.0 2.0  --   --  2.0  --  2.0  PHOS 5.6* 5.2*  --  6.0* 6.1*  --   --   --   --  7.0*   < > = values in this interval not displayed.    CBC: Recent Labs  Lab 01/04/21 1938 01/04/21 1944 01/05/21 0400 01/05/21 0504 01/06/21 0408 01/06/21 0434 01/06/21 0919 01/07/21 0334 01/07/21 0613 01/08/21 0035 01/08/21 0341  WBC 22.9*  --  23.3*  --  20.3*  --   --  17.2*   --   --  13.5*  HGB 11.1*   < > 11.0*   < > 10.0*   < > 9.5* 9.9* 9.5* 10.9* 10.9*  HCT 31.2*   < > 31.6*   < > 30.2*   < > 28.0* 29.1* 28.0* 32.0* 33.1*  MCV 86.7  --  87.8  --  91.8  --   --  92.1  --   --  90.7  PLT 81*  --  70*  --  53*  --   --  49*  --   --  61*   < > = values in this interval not displayed.     Coagulation Studies: No results for input(s): LABPROT, INR in the last 72 hours.  Imaging CT head without contrast 01/07/2021: No acute abnormality.  No evidence of brain edema.  ASSESSMENT AND PLAN: 70 year old male presented with chest pain who initially presented on 01/03/2021 with chest pain and was found to have STEMI, s/p LHC and placement of TVP  for bradycardia, placement of impella, and stenting of RCA.  Postop course was complicated by V. fib arrest and hemorrhagic shock at the site of Impella insertion.  Today patient will need to have twitching of mouth.  Seizure-like episode Acute encephalopathy -LTM EEG showed generalized periodic discharges with triphasic morphology at 2.5 to 3 Hz which is nonspecific but can be seen due to encephalopathy, anoxic/hypoxic brain injury -Patient continues to have twitching of the left corner of mouth which could be focal motor seizure versus myoclonus.  Recommendations: - Continue Keppra 500 mg twice daily.  Also on propofol for management of hypertension. -We will consider increasing Keppra if the twitching worsens or involves rest of the body -Discontinue LTM EEG as EEG appears similar to previous day -We will obtain MRI brain w without contrast possibly tomorrow after MRI devices removed -Continue seizure precautions -As needed IV Ativan 2 mg for clinical seizure-like activity lasting more than 5 minutes.  Please call neuro if needed.  Thank you for allowing [place] to participate in the care of this patient. If you have any further questions, please contact  me or neurohospitalist.   I have spent a total of  25 minutes with  the patient reviewing hospital notes,  test results, labs and examining the patient as well as establishing an assessment and plan.  > 50% of time was spent in direct patient care.    Zeb Comfort Epilepsy [facility] For questions after 5pm please refer to AMION to reach the Neurologist on call

## 2021-01-08 NOTE — Progress Notes (Signed)
LTM EEG D/C'd, no skin breakdown noted 

## 2021-01-08 NOTE — Progress Notes (Signed)
Inpatient Diabetes Program Recommendations  AACE/ADA: New Consensus Statement on Inpatient Glycemic Control (2015)  Target Ranges:  Prepandial:   less than 140 mg/dL      Peak postprandial:   less than 180 mg/dL (1-2 hours)      Critically ill patients:  140 - 180 mg/dL   Lab Results  Component Value Date   GLUCAP 135 (H) 01/08/2021   HGBA1C 5.7 (H) 01/06/2021    Review of Glycemic Control Results for [name], [name] (MRN [id number]) as of 01/08/2021 08:58  Ref. Range 01/07/2021 07:54 01/07/2021 11:00 01/07/2021 11:45 01/07/2021 16:16 01/07/2021 19:35 01/08/2021 00:06 01/08/2021 03:45 01/08/2021 06:47  Glucose-Capillary Latest Ref Range: 70 - 99 mg/dL 75 73 75 90 131 (H) 129 (H) 131 (H) 135 (H)   Inpatient Diabetes Program Recommendations:   -D/C Lantus Patient did not receive Lantus yesterday due to lower CBGs.  Thank you, Nani Gasser. Avonlea Sima, RN, MSN, CDE  Diabetes Coordinator Inpatient Glycemic Control Team Team Pager [phone] (8am-5pm) 01/08/2021 9:02 AM

## 2021-01-08 NOTE — Procedures (Addendum)
Patient Name: Rome Echavarria  MRN: [id number]  Epilepsy Attending: Lora Havens  Referring Physician/Provider: Dr. Zeb Comfort Duration: 01/07/2021 1223 to 01/08/2021 1143  Patient history: 70 year old male with seizure-like episode.  EEG to evaluate for seizures.  Level of alertness: Lethargic  AEDs during EEG study: LEV  Technical aspects: This EEG study was done with scalp electrodes positioned according to the 10-20 International system of electrode placement. Electrical activity was acquired at a sampling rate of 500Hz  and reviewed with a high frequency filter of 70Hz  and a low frequency filter of 1Hz . EEG data were recorded continuously and digitally stored.   Description: EEG showed continuous generalized 3 to 5 Hz theta-delta slowing. Generalized periodic discharges with triphasic morphology at 2.5-3 Hz were also noted.  Patient was also noted to have subtle chin/mouth tremor during the EEG.  Concomitant EEG before, during and after the event continue to show generalized periodic discharges at 2.5 to 3 Hz.  Hyperventilation and photic stimulation were not performed.     Patient was noted to have left corner of the mouth twitching intermittently throughout the study.  Concomitant EEG before, during and after the event did not show any EEG changes suggest seizure.  ABNORMALITY - Periodic discharges with triphasic morphology, generalized ( GPDs) - Continuous slow, generalized  IMPRESSION: This study showed generalized periodic discharges with triphasic morphology at 2.5 to 3 Hz which is  on the ictal-interictal continuum with moderate to high potential for seizures.    Patient was noted to have left: Twitching without concomitant EEG change.  However focal motor seizures may not be seen on scalp EEG.  Additionally there is evidence of moderate to severe diffuse encephalopathy, nonspecific etiology could be secondary to cefepime toxicity, anoxic /hypoxic brain injury.     Pranika Finks Barbra Sarks

## 2021-01-08 NOTE — Progress Notes (Addendum)
Advanced Heart Failure Rounding Note  PCP-Cardiologist: No primary care provider on file.   Subjective:    Remains intubated. FiO2 40%. Awake on vent. ? Seizure like activity yesterday. Neuro following.   EEG w/ generalized periodic discharges with triphasic morphology + moderate to severe diffuse encephalopathy. ? cefepime toxicity +/- anoxic /hypoxic brain injury. Cefepime discontinued. Now on Rocephin. WBC 23K -> 20->17->13K. AF    Off pressors. Impella at P-6. Flow 2.6. Co-ox 74%. CVP 8. SVR lower today 792.   Creatinine continues to rise, 1.7 -> 1.8 -> 2.2->2.9->3.1->3.69->3.75 but excellent UOP yesterday w/ lasix gtt, -7.1L. Net negative 4.4L for the day. Urine clear. CXR today c/w mild CHF.    Slight serous oozing from Impella site. Hgb stable at 10.9. Plts trending back up, nadir 49>>61K today.    Swan numbers  CVP 8  PA  25/10 (16) Thermo 8.9/4.2 SVR 792   Objective:   Weight Range: 102.2 kg Body mass index is 29.73 kg/m.   Vital Signs:   Temp:  [97.88 F (36.6 C)-98.96 F (37.2 C)] 98.78 F (37.1 C) (04/19 0646) Pulse Rate:  [71-100] 95 (04/19 0646) Resp:  [14-19] 16 (04/19 0646) BP: (119-169)/(64-83) 164/74 (04/19 0600) SpO2:  [95 %-100 %] 96 % (04/19 0646) Arterial Line BP: (117-180)/(60-79) 151/61 (04/19 0646) FiO2 (%):  [40 %-50 %] 40 % (04/19 0424) Weight:  [102.2 kg] 102.2 kg (04/19 0500) Last BM Date:  (PTA)  Weight change: Filed Weights   01/06/21 0452 01/07/21 0618 01/08/21 0500  Weight: 106.9 kg 105 kg 102.2 kg    Intake/Output:   Intake/Output Summary (Last 24 hours) at 01/08/2021 0731 Last data filed at 01/08/2021 0700 Gross per 24 hour  Intake 2489.22 ml  Output 7050 ml  Net -4560.78 ml      Physical Exam    CVP 8  General:  Critically ill WM, intubated awake on vent  HEENT: + ETT, normal Neck: supple. + Rt IV Swan, JVD 8 cm. Carotids 2+ bilat; no bruits. No lymphadenopathy or thyromegaly appreciated. Cor: PMI  nondisplaced. Regular rate & rhythm. No rubs, gallops or murmurs. Lungs: intubated and clear Abdomen: soft, nontender, nondistended. No hepatosplenomegaly. No bruits or masses. Good bowel sounds. Extremities: no cyanosis, clubbing, rash, 2+ edema, + Lt femoral impella site w/ mild drainage/bleeding, + left femoral hematoma + Rt femoral TVP wires Neuro: intubated and sedated   Telemetry   Sinus 70s Personally reviewed  Labs    CBC Recent Labs    01/07/21 0334 01/07/21 0613 01/08/21 0035 01/08/21 0341  WBC 17.2*  --   --  13.5*  HGB 9.9*   < > 10.9* 10.9*  HCT 29.1*   < > 32.0* 33.1*  MCV 92.1  --   --  90.7  PLT 49*  --   --  61*   < > = values in this interval not displayed.   Basic Metabolic Panel Recent Labs    01/07/21 0334 01/07/21 0613 01/08/21 0032 01/08/21 0035 01/08/21 0341  NA 138   < > 138 138 139  K 3.6   < > 3.5 3.5 3.6  CL 104   < > 100  --  101  CO2 24   < > 24  --  26  GLUCOSE 78   < > 123*  --  135*  BUN 43*   < > 51*  --  52*  CREATININE 3.14*   < > 3.69*  --  3.75*  CALCIUM 7.6*   < >  8.0*  --  8.0*  MG 2.0  --  2.0  --  2.0  PHOS 6.1*  --   --   --  7.0*   < > = values in this interval not displayed.   Liver Function Tests Recent Labs    01/05/21 2028 01/08/21 0341  AST 38 34  ALT 35 28  ALKPHOS 34* 104  BILITOT 0.6 1.0  PROT 4.4* 5.5*  ALBUMIN 2.1* 1.9*   No results for input(s): LIPASE, AMYLASE in the last 72 hours. Cardiac Enzymes No results for input(s): CKTOTAL, CKMB, CKMBINDEX, TROPONINI in the last 72 hours.  BNP: BNP (last 3 results) No results for input(s): BNP in the last 8760 hours.  ProBNP (last 3 results) No results for input(s): PROBNP in the last 8760 hours.   D-Dimer No results for input(s): DDIMER in the last 72 hours. Hemoglobin A1C Recent Labs    01/06/21 0408  HGBA1C 5.7*   Fasting Lipid Panel Recent Labs    01/08/21 0341  TRIG 140   Thyroid Function Tests No results for input(s): TSH,  T4TOTAL, T3FREE, THYROIDAB in the last 72 hours.  Invalid input(s): FREET3  Other results:   Imaging    CT HEAD WO CONTRAST  Result Date: 01/07/2021 CLINICAL DATA:  Seizure.  Anoxic brain damage. EXAM: CT HEAD WITHOUT CONTRAST TECHNIQUE: Contiguous axial images were obtained from the base of the skull through the vertex without intravenous contrast. COMPARISON:  None. FINDINGS: Brain: No evidence of acute infarction, hemorrhage, hydrocephalus, extra-axial collection or mass lesion/mass effect. No evidence of brain edema or mass-effect. Vascular: Negative for hyperdense vessel Skull: Negative Sinuses/Orbits: Extensive mucosal edema and air-fluid levels throughout the paranasal sinuses. Negative orbit Other: None IMPRESSION: No acute abnormality.  No evidence of brain edema. Extensive mucosal edema and air-fluid levels throughout the paranasal sinuses. Electronically Signed   By: Franchot Gallo M.D.   On: 01/07/2021 14:45   DG Chest Port 1 View  Result Date: 01/08/2021 CLINICAL DATA:  Intubation. EXAM: PORTABLE CHEST 1 VIEW COMPARISON:  01/06/2021. FINDINGS: Endotracheal tube and NG tube in stable position. Swan-Ganz catheter tip noted over a lower right pulmonary artery. Impella device in stable position. Cardiomegaly. Mild bilateral interstitial prominence. Tiny bilateral pleural effusions. Findings suggest mild CHF. Atelectatic changes and/or infiltrate right mid lung. Thorax. Degenerative change thoracic spine. IMPRESSION: 1. Endotracheal tube and NG tube in stable position. Swan-Ganz catheter tip noted over a lower right pulmonary artery. 2. Impella device in stable position. Cardiomegaly. Mild bilateral interstitial prominence. Tiny bilateral pleural effusions. Findings suggest mild CHF. 3.  Atelectatic changes and or infiltrate right mid lung. Electronically Signed   By: Marcello Moores  Register   On: 01/08/2021 06:50   EEG adult  Result Date: 01/07/2021 Lora Havens, MD     01/07/2021 12:37 PM  Patient Name: [name] MRN: [id number] Epilepsy Attending: Lora Havens Referring Physician/Provider: Dr. Ina Homes Date: 01/07/2021 Duration: 24.33 mins Patient history: 70 year old male with seizure-like episode.  EEG to evaluate for seizures. Level of alertness: Lethargic AEDs during EEG study: None Technical aspects: This EEG study was done with scalp electrodes positioned according to the 10-20 International system of electrode placement. Electrical activity was acquired at a sampling rate of _0  and reviewed with a high frequency filter of _1  and a low frequency filter of _2 . EEG data were recorded continuously and digitally stored. Description: EEG showed continuous generalized 3 to 5 Hz theta-delta slowing. Generalized periodic discharges with triphasic morphology at 2.5-3 Hz  were also noted.  Patient was also noted to have subtle chin/mouth tremor during the EEG.  Concomitant EEG before, during and after the event continue to show generalized periodic discharges at 2.5 to 3 Hz.  Hyperventilation and photic stimulation were not performed.   ABNORMALITY - Periodic discharges with triphasic morphology, generalized ( GPDs) - Continuous slow, generalized IMPRESSION: This study showed generalized periodic discharges with triphasic morphology at 2.5 to 3 Hz which is  on the ictal-interictal continuum with moderate to high potential for seizures.  Additionally there is evidence of moderate to severe diffuse encephalopathy, nonspecific etiology could be secondary to cefepime toxicity, anoxic /hypoxic brain injury.  Patient was noted to have subtle chin/mouth tremor during the EEG without concomitant EEG change.  However focal motor seizures may not be seen on scalp EEG.  Differentials also include myoclonus.  Recommend clinical correlation. Priyanka Barbra Sarks     Medications:     Scheduled Medications: . aspirin  81 mg Per Tube Daily  . chlorhexidine      . chlorhexidine gluconate (MEDLINE  KIT)  15 mL Mouth Rinse BID  . Chlorhexidine Gluconate Cloth  6 each Topical Daily  . feeding supplement (PROSource TF)  45 mL Per Tube TID  . feeding supplement (VITAL 1.5 CAL)  1,000 mL Per Tube Q24H  . insulin aspart  2-6 Units Subcutaneous Q4H  . insulin glargine  20 Units Subcutaneous Daily  . mouth rinse  15 mL Mouth Rinse 10 times per day  . metoCLOPramide (REGLAN) injection  5 mg Intravenous Q8H  . pantoprazole sodium  40 mg Per Tube Daily  . polyethylene glycol  17 g Per Tube BID  . rosuvastatin  10 mg Per Tube Daily  . sodium chloride flush  10-40 mL Intracatheter Q12H  . sodium chloride flush  3 mL Intravenous Q12H  . ticagrelor  90 mg Per Tube BID    Infusions: . sodium chloride 250 mL (01/05/21 2202)  . cefTRIAXone (ROCEPHIN)  IV    . dextrose    . epinephrine    . fentaNYL infusion INTRAVENOUS 100 mcg/hr (01/08/21 0600)  . furosemide (LASIX) 200 mg in dextrose 5% 100 mL (93m/mL) infusion 12 mg/hr (01/08/21 0600)  . impella catheter heparin 25 unit/mL in dextrose 5%    . levETIRAcetam Stopped (01/07/21 2145)  . midazolam Stopped (01/03/21 1300)  . norepinephrine (LEVOPHED) Adult infusion Stopped (01/07/21 1117)  . propofol (DIPRIVAN) infusion Stopped (01/07/21 0946)  . vancomycin 1,250 mg (01/08/21 01610    PRN Medications: sodium chloride, sodium chloride, acetaminophen (TYLENOL) oral liquid 160 mg/5 mL, dextrose, ondansetron (ZOFRAN) IV, sodium chloride flush, sodium chloride flush    Assessment/Plan   1. CAD/ Acute Inferior STEMI - emergent cath w/ pRCA occlusion s/p PCI + DES - Initial angiography showed sluggish flow in LAD and LCX>>improved post impella placement  - Echo LVEF 20-25%, RV moderately reduced - DAPT w/ ASA + Brilinta  - Crestor 10 per tube   2. VF Arrest - in setting of acute MI, now s/p revascularization  - no further ectopy on tele. Amio stopped - keep K > 4.0 and Mg > 2.0  - EEG with diffuse encephalopathy but no seizure activity.  Per RN staff patient followed simple commands over the weekend   3. Shock, Combination Cardiogenic + Hemorrhagic  - Acute MI + developed severe hemorrhagic shock due to bleeding at left groin Impella Transfused 4U PRBCs . hgb now stable - C/w Impella support for cardiogenic shock, now  off inotrope/pressors (see discussion below)  4. Acute Systolic Heart Failure>>Cardiogenic Shock  - ICM Echo LVEF 20-25%, RV moderately reduced - Continue Impella at P-6. Waveforms ok.  - He is well supported on the left side with Impella. Main issue now is RV failure and marked volume overload in setting of worsening AKI (suspect ATN) - Continues w/ Impella at P-6, Flow 2.6  - Currently off pressors/inotropes. Co-ox good at 74% - CVP 7-8. Continues w/ good UOP. CXR today c/w mild CHF   - continue diuresis again today w/ goal removal -4L. Keep lasix gtt at 12/hr - will try to remove Impella tomorrow  - off dig with AKI  5. Acute hypoxic respiratory failure - now on 40% FIO2 - CXR improving but still suggestive of mild CHF - needs more fluid off to help wean vent - vent management per PCCM   5. Nonoliguric AKI - due to ATN/shock - SCr 1.17 -> 1.6 -> 1.4 -> 1.7 -> 1.8 -> 2.2->2.9->3.1->3.69->3.75 - UOP good  - Keep SBP > 100 - Plan as above  6. Hypokalemia/hyperkalemia - K 3.6. supp gently - Mg 2.0   7. Leukocytosis - WBC 23K -> 20->17-13K. - ? Reactive from acute MI. With increasing co-ox watch for sepsis - continue empiric abx  8. DM2 - off insulin gtt - now on levimir and SSI  9. Encephalopathy  - post cardiac arrest - head CT negative - EEG w/ generalized periodic discharges with triphasic morphology + moderate to severe diffuse encephalopathy. ? cefepime toxicity +/- anoxic /hypoxic brain injury. Now off cefepime - plan brain MRI tomorrow after Impella removal  - neurology following     Length of Stay: [address], PA-C  01/08/2021, 7:31 AM  Advanced Heart Failure  Team Pager [phone] (M-F; 7a - 5p)  Please contact [facility] for night-coverage after hours (5p -7a ) and weekends on [email or web address]   Lyda Jester, PA-C  7:31 AM   Agree with above.   Remains on vent. Off pressors. Remains on Impella at P-6. Hemodynamics much improved. Has diuresed very well -7L   Major issue remains his mental status. Head CT non-acute. Non responsive on exam'  General:  On vent. Eyes open unresoponsive HEENT: normal + ETT Neck: supple. RIJ swan Carotids 2+ bilat; no bruits. No lymphadenopathy or thryomegaly appreciated. Cor: PMI nondisplaced. Regular rate & rhythm. No rubs, gallops or murmurs. Lungs: clear Abdomen: soft, nontender, nondistended. No hepatosplenomegaly. No bruits or masses. Good bowel sounds. Extremities: no cyanosis, clubbing, rash, 1-2+ edema LIJ impella  Neuro: as above  Remains critically ill. Hemodynamics much improved Still volume overloaded. High concern for anoxic brain injury.   VAD interrogated personally. Parameters stable.  Plan - continue impella support and IV diuresis 1 more day - plan to wean impella tomorrow followed by brain MRI fo further prognostication - lighten sedatives as tolerated.  D/w CCM at bedside  CRITICAL CARE Performed by: Glori Bickers  Total critical care time: 45 minutes  Critical care time was exclusive of separately billable procedures and treating other patients.  Critical care was necessary to treat or prevent imminent or life-threatening deterioration.  Critical care was time spent personally by me (independent of midlevel providers or residents) on the following activities: development of treatment plan with patient and/or surrogate as well as nursing, discussions with consultants, evaluation of patient's response to treatment, examination of patient, obtaining history from patient or surrogate, ordering and performing treatments and interventions, ordering and review of laboratory  studies, ordering and review of radiographic studies, pulse oximetry and re-evaluation of patient's condition.  Glori Bickers, MD  9:44 AM

## 2021-01-08 NOTE — Progress Notes (Signed)
[place] Progress Note Patient Name: Mina Carlisi DOB: 1951-07-17 MRN: [id number]   Date of Service  01/08/2021  HPI/Events of Note  Notified of K 3.2, creatinine 3.95 Continues to be on Lasix drip  eICU Interventions  Re-ordered K packet 40 meqs x 1     Intervention Category Intermediate Interventions: Electrolyte abnormality - evaluation and management  Judd Lien 01/08/2021, 10:00 PM

## 2021-01-08 NOTE — Progress Notes (Signed)
eLink Physician-Brief Progress Note Patient Name: [name] DOB: 01-07-1951 MRN: [id number]   Date of Service  01/08/2021  HPI/Events of Note  Notified of K 3.5 on Lasix drip  eICU Interventions  Reordered k 40 meqs per feeding tube     Intervention Category Intermediate Interventions: Electrolyte abnormality - evaluation and management  Judd Lien 01/08/2021, 2:26 AM

## 2021-01-08 NOTE — Plan of Care (Signed)

## 2021-01-09 ENCOUNTER — Inpatient Hospital Stay (HOSPITAL_COMMUNITY): Payer: Medicare PPO

## 2021-01-09 DIAGNOSIS — I2510 Atherosclerotic heart disease of native coronary artery without angina pectoris: Secondary | ICD-10-CM | POA: Diagnosis not present

## 2021-01-09 DIAGNOSIS — I5021 Acute systolic (congestive) heart failure: Secondary | ICD-10-CM | POA: Diagnosis not present

## 2021-01-09 DIAGNOSIS — I213 ST elevation (STEMI) myocardial infarction of unspecified site: Secondary | ICD-10-CM

## 2021-01-09 DIAGNOSIS — N179 Acute kidney failure, unspecified: Secondary | ICD-10-CM

## 2021-01-09 DIAGNOSIS — R569 Unspecified convulsions: Secondary | ICD-10-CM | POA: Diagnosis not present

## 2021-01-09 DIAGNOSIS — R570 Cardiogenic shock: Secondary | ICD-10-CM | POA: Diagnosis not present

## 2021-01-09 LAB — BASIC METABOLIC PANEL
Anion gap: 12 (ref 5–15)
Anion gap: 15 (ref 5–15)
BUN: 66 mg/dL — ABNORMAL HIGH (ref 8–23)
BUN: 79 mg/dL — ABNORMAL HIGH (ref 8–23)
CO2: 30 mmol/L (ref 22–32)
CO2: 29 mmol/L (ref 22–32)
Calcium: 7.8 mg/dL — ABNORMAL LOW (ref 8.9–10.3)
Calcium: 7.8 mg/dL — ABNORMAL LOW (ref 8.9–10.3)
Chloride: 100 mmol/L (ref 98–111)
Chloride: 99 mmol/L (ref 98–111)
Creatinine, Ser: 4.08 mg/dL — ABNORMAL HIGH (ref 0.61–1.24)
Creatinine, Ser: 4.33 mg/dL — ABNORMAL HIGH (ref 0.61–1.24)
GFR, Estimated: 15 mL/min — ABNORMAL LOW (ref >60)
GFR, Estimated: 14 mL/min — ABNORMAL LOW (ref >60)
Glucose, Bld: 168 mg/dL — ABNORMAL HIGH (ref 70–99)
Glucose, Bld: 128 mg/dL — ABNORMAL HIGH (ref 70–99)
Potassium: 3.2 mmol/L — ABNORMAL LOW (ref 3.5–5.1)
Potassium: 3.5 mmol/L (ref 3.5–5.1)
Sodium: 142 mmol/L (ref 135–145)
Sodium: 143 mmol/L (ref 135–145)

## 2021-01-09 LAB — COOXEMETRY PANEL
Carboxyhemoglobin: 1 % (ref 0.5–1.5)
Methemoglobin: 1.3 % (ref 0.0–1.5)
O2 Saturation: 65.4 %
Total hemoglobin: 10.8 g/dL — ABNORMAL LOW (ref 12.0–16.0)

## 2021-01-09 LAB — CBC
HCT: 31.5 % — ABNORMAL LOW (ref 39.0–52.0)
Hemoglobin: 10.4 g/dL — ABNORMAL LOW (ref 13.0–17.0)
MCH: 29.9 pg (ref 26.0–34.0)
MCHC: 33 g/dL (ref 30.0–36.0)
MCV: 90.5 fL (ref 80.0–100.0)
Platelets: 68 10*3/uL — ABNORMAL LOW (ref 150–400)
RBC: 3.48 MIL/uL — ABNORMAL LOW (ref 4.22–5.81)
RDW: 15.1 % (ref 11.5–15.5)
WBC: 10.9 10*3/uL — ABNORMAL HIGH (ref 4.0–10.5)
nRBC: 0.2 % (ref 0.0–0.2)

## 2021-01-09 LAB — POCT I-STAT 7, (LYTES, BLD GAS, ICA,H+H)
Acid-Base Excess: 9 mmol/L — ABNORMAL HIGH (ref 0.0–2.0)
Bicarbonate: 31.7 mmol/L — ABNORMAL HIGH (ref 20.0–28.0)
Calcium, Ion: 1.05 mmol/L — ABNORMAL LOW (ref 1.15–1.40)
HCT: 32 % — ABNORMAL LOW (ref 39.0–52.0)
Hemoglobin: 10.9 g/dL — ABNORMAL LOW (ref 13.0–17.0)
O2 Saturation: 94 %
Patient temperature: 37.4
Potassium: 3.3 mmol/L — ABNORMAL LOW (ref 3.5–5.1)
Sodium: 140 mmol/L (ref 135–145)
TCO2: 33 mmol/L — ABNORMAL HIGH (ref 22–32)
pCO2 arterial: 37.9 mmHg (ref 32.0–48.0)
pH, Arterial: 7.531 — ABNORMAL HIGH (ref 7.350–7.450)
pO2, Arterial: 65 mmHg — ABNORMAL LOW (ref 83.0–108.0)

## 2021-01-09 LAB — GLUCOSE, CAPILLARY
Glucose-Capillary: 127 mg/dL — ABNORMAL HIGH (ref 70–99)
Glucose-Capillary: 131 mg/dL — ABNORMAL HIGH (ref 70–99)
Glucose-Capillary: 133 mg/dL — ABNORMAL HIGH (ref 70–99)
Glucose-Capillary: 151 mg/dL — ABNORMAL HIGH (ref 70–99)
Glucose-Capillary: 156 mg/dL — ABNORMAL HIGH (ref 70–99)
Glucose-Capillary: 171 mg/dL — ABNORMAL HIGH (ref 70–99)
Glucose-Capillary: 188 mg/dL — ABNORMAL HIGH (ref 70–99)
Glucose-Capillary: 193 mg/dL — ABNORMAL HIGH (ref 70–99)

## 2021-01-09 LAB — MAGNESIUM: Magnesium: 1.9 mg/dL (ref 1.7–2.4)

## 2021-01-09 LAB — LACTATE DEHYDROGENASE: LDH: 407 U/L — ABNORMAL HIGH (ref 98–192)

## 2021-01-09 LAB — HEPARIN LEVEL (UNFRACTIONATED): Heparin Unfractionated: <0.1 IU/mL — ABNORMAL LOW (ref 0.30–0.70)

## 2021-01-09 LAB — TRIGLYCERIDES: Triglycerides: 109 mg/dL (ref <150)

## 2021-01-09 MED ORDER — POTASSIUM CHLORIDE 20 MEQ PO PACK
60.0000 meq | PACK | Freq: Once | ORAL | Status: AC
  Administered 2021-01-09: 60 meq
  Filled 2021-01-09: qty 3

## 2021-01-09 MED ORDER — MAGNESIUM SULFATE 2 GM/50ML IV SOLN
2.0000 g | Freq: Once | INTRAVENOUS | Status: AC
  Administered 2021-01-09: 2 g via INTRAVENOUS
  Filled 2021-01-09: qty 50

## 2021-01-09 MED ORDER — PROSOURCE TF PO LIQD
45.0000 mL | Freq: Three times a day (TID) | ORAL | Status: DC
  Administered 2021-01-09 – 2021-01-16 (×19): 45 mL
  Filled 2021-01-09 (×20): qty 45

## 2021-01-09 MED ORDER — POTASSIUM CHLORIDE 20 MEQ PO PACK
40.0000 meq | PACK | Freq: Once | ORAL | Status: AC
  Administered 2021-01-09: 40 meq
  Filled 2021-01-09: qty 2

## 2021-01-09 MED ORDER — VITAL 1.5 CAL PO LIQD
1000.0000 mL | ORAL | Status: DC
  Administered 2021-01-09 – 2021-01-14 (×5): 1000 mL
  Filled 2021-01-09: qty 1000

## 2021-01-09 MED ORDER — PROSOURCE TF PO LIQD: 90.0000 mL | Freq: Three times a day (TID) | ORAL | Status: DC

## 2021-01-09 NOTE — Progress Notes (Signed)
Per Bensimhon, MD, ok to remove swan ganz catheter for MRI.

## 2021-01-09 NOTE — Progress Notes (Signed)
[place] Progress Note Patient Name: [name] DOB: May 07, 1951 MRN: [id number]   Date of Service  01/09/2021  HPI/Events of Note  Hypokalemia - K+ 3.5 and Creatinine = 4.33 Patient is on a Lasix IV infusion and 2. Ca++ = 7.8 which corrects to 9.48 (Normal) given Albumin = 1.9  eICU Interventions  Plan: 1. Will replace K+. 2. No intervention indicated for Ca++.      Intervention Category Major Interventions: Electrolyte abnormality - evaluation and management  Destanie Tibbetts [place] 01/09/2021, 10:22 PM

## 2021-01-09 NOTE — Progress Notes (Signed)
   01/09/21 1650  Clinical Encounter Type  Visited With Family (Spoke with Penni Homans on nurse, [place] phone)  Visit Type Other (Comment)  Referral From Nurse  Consult/Referral To Chaplain  Chaplain responded. This chaplain spoke with nurse Raquel Sarna, who stated the patient's family member Penni Homans is visiting and presented a document signed by the patient and his attorney. Shawn put [name] on the phone, and he advised me of the same. I informed him I will need to follow-up with him after I research to determine how to handle an HCPOA document not signed by our hospital. He stated he will be visiting tomorrow. This note was prepared by Jeanine Luz, M.Div..  For questions please contact by phone [phone].

## 2021-01-09 NOTE — Progress Notes (Signed)
NAME:  [name], MRN:  [id number], DOB:  06-22-1951, LOS: 6 ADMISSION DATE:  01/03/2021, CONSULTATION DATE:  01/03/2021 REFERRING MD:  Dr. Haroldine Laws, CHIEF COMPLAINT:  STEMI/ cardiogenic shock  History of Present Illness:   70 year old male with PMHx HTN, HLD, CAD presenting via EMS with sudden onset of CP, found to have inferior STEMI with bradycardia and hypotension.  Taken emergently to Cath Lab on arrival.  Temporary pacer was placed with good capture; however, patient then developed profound hypotension decompensating into cardiac arrest, requiring intubation by anesthesia and CPR with episodes of VF requiring defibrillation and amiodarone. Patient was found to have total proximal RCA occlusion and underwent PCI to the RCA with stent placement.  Procedure was further c/b cardiogenic shock (requiring placement of Impella device) c/b bleeding secondary to cangrelor with development of bleeding and hematoma at puncture sites, Hgb dropped from 12.6 to 7.5 requiring emergent transfusion of 4U PRBCs and cangrelor discontinuation. Patient required vasopressors on initial presentation to ICU. Patient returned to ICU intubated and sedated in cardiogenic shock with TVP and Impella. PCCM consulted for vent management.   Pertinent  Medical History  HTN, HLD, CAD  Significant Hospital Events: Including procedures, antibiotic start and stop dates in addition to other pertinent events   . 4/14 Admitted Cards with inferior STEMI-> bradycardia s/p TVP, cardiogenic shock and cardiac arrest s/p impella with PCI/stent to RCA, complicated by bleeding s/p 4 units PRBC thought 2/2 cangrelor, pending coags.  Has R IJ PA cath, TVP R groin, impella L groin . 4/15 Remains on impella, weaning pressors and inotropes. Started diuresis due to significantly increased oxygen requirements. . 4/18 Concern for seizure-like activity, Neuro consult, LTM/EEG, CT Head NAICA/no evidence of edema. Transitioned to ceftriaxone from  cefepime. . 4/19 Remains on Impella, off pressors. Continuing diuresis with hopeful discontinuation of Impella, allowing for MRI 4/20-4/21. Continuing LTM EEG, given facial twitching/unclear neurologic status. . 4/20 >8L UOP/24H with Lasix gtt, Impella at P6, MRI Brain pending Impella removal, family meeting re: GOC today pending MRI  Interim History / Subjective:  No significant events overnight Remains intubated, minimally sedated on Prop (BP control) Continues to open eyes seemingly purposefully, but not tracking/following commands S/p LTM, Epilepsy following Impella at P6, hopeful removal today Net negative 5.7L/24H ?MRI today pending Impella removal Family meeting/GOC pending MRI  Objective   Blood pressure (!) 143/78, pulse 93, temperature 99.3 F (37.4 C), resp. rate 18, height 6\' 1"  (1.854 m), weight 98.8 kg, SpO2 94 %. PAP: (22-29)/(9-17) 26/12 CVP:  [7 mmHg-14 mmHg] 10 mmHg CO:  [6.8 L/min-8.9 L/min] 6.8 L/min CI:  [3.2 L/min/m2-4.2 L/min/m2] 3.2 L/min/m2  Vent Mode: PRVC FiO2 (%):  [40 %] 40 % Set Rate:  [16 bmp] 16 bmp Vt Set:  [640 mL] 640 mL PEEP:  [8 cmH20-10 cmH20] 8 cmH20 Plateau Pressure:  [20 cmH20-22 cmH20] 21 cmH20   Intake/Output Summary (Last 24 hours) at 01/09/2021 0739 Last data filed at 01/09/2021 0700 Gross per 24 hour  Intake 2493.73 ml  Output 8035 ml  Net -5541.27 ml   Filed Weights   01/07/21 0618 01/08/21 0500 01/09/21 0424  Weight: 105 kg 102.2 kg 98.8 kg   Physical Examination: General: Chronically ill-appearing elderly male in NAD. HEENT: [place]/AT, anicteric sclera, PERRL 66mm, moist mucous membranes. Neuro: Awake on vent, not responsive/unable to assess orientation. Opens eyes somewhat purposefully to verbal stimuli but does not track. Does not respond to withdraw to pain. Not following commands. +Corneal, +Cough and +Gag  CV:  RRR, no m/g/r. PULM: Breathing even and unlabored on vent (PEEP 8, FiO2 40%). Lung fields diminished bilaterally,  occasional crackles. GI: Soft, nontender, mildly distended. Hypooactive bowel sounds. Extremities: Bilateral symmetric 1+ pitting LE edema noted. Skin: Warm/dry, ecchymosis noted to L shoulder/upepr chest, scrotal ecchymosis noted.  Labs/imaging that I have personally reviewed: (right click and "Reselect all SmartList Selections" daily)  Na 142, K 3.2 (repleted) Uptrending Cr (4.08 from 3.95) Co-ox 65.4% LDH 407 Glucoses 118-185  Resolved Hospital Problem list   Hemorrhagic shock Lactic acidosis  Assessment & Plan:   Inferior STEMI s/p PCI/ stent to main dominant RCA requiring mechanical and inotropic support Cardiogenic shock due to ischemic cardiomyopathy Vfib cardiac arrest  Bradycardia s/p TVP; now intrinsically pacing - HF team primary, appreciate assistance with management - Impella management per HF, hopeful for wean and d/c today to obtain MRI - Continues on Lasix gtt at 12 (net -5.7L/24H 4/19-4/20) - Remains off pressors - Continue DAPT  Acute hypoxic respiratory failure, acute cardiogenic pulmonary edema - Continue full vent support (6-8cc/kg IBW) - Wean FiO2 for O2 sat > 90% - Daily WUA/SBT - VAP bundle - Pulmonary hygiene - PAD protocol for sedation: Propofol for goal RASS 0 to -1  - Discuss any vent weaning with HF team  Seizure-like activity Concern for seizure-like activity noted 4/18 with facial/mouth twitching. EEG demonstrating "periodic discharges with triphasic morphology with moderate to high potential for seizures; e/o moderate to severe diffuse encephalopathy, nonspecific but could be 2/2 cefepime vs. anoxic/hypoxic brain injury". CT Head NAICA and no e/o edema. - Neuro/Epilepsy following, appreciate assistance - S/p LTM as above - Continue Keppra, Ativan PRN - Continue seizure precautions - Neuroprotective measures: HOB > 30 degrees, normoglycemia, normothermia, electrolytes WNL - MRI Brain today, 4/20 if able to d/c Impella  Hemorrhagic shock,  resolved, hematoma around Impella insertion site due to cangrelor infusion requiring during LHC. Hematoma and H/H remain stable. Acute blood loss anemia Thrombocytopenia Multifactorial from bleeding, impella associated hemolysis, critical illness - Trend H&H - Monitor for signs/symptoms of active bleeding - Transfuse for Hgb < 7.0  AKI-worsening Hyperkalemia, resolved Hypocalcemia Hyperphosphatemia - Management per HF team - Trend BMP - Replete electrolytes as indicated - Monitor I&Os - Avoid nephrotoxic agents as able  Constipation - Continue aggressive bowel regimen  Leukocytosis, fever yesterday - Continue Rocephin  At risk for malnutrition DM with Hypoglycemia - Continue Lantus 20U daily - SSI PRN - Continue TF - Monitor CBG, goal 140-180  Best practice (right click and "Reselect all SmartList Selections" daily)  Diet:  Tube Feed  Pain/Anxiety/Delirium protocol (if indicated): Yes (RASS goal 0) to -1 VAP protocol (if indicated): Yes DVT prophylaxis: Systemic AC GI prophylaxis: PPI Glucose control:  SSI Yes and Basal insulin Yes Central venous access:  Yes, and it is still needed Arterial line:  Yes, and it is still needed Foley:  Yes, and it is still needed Mobility:  bed rest  PT consulted: N/A Last date of multidisciplinary goals of care discussion [4/14 per cardiology, repeat [place] discussion with family pending MRI Brain] Code Status:  full code Disposition: ICU   Critical care time: 35 minutes   Lestine Mount, PA-C [facility] 01/09/21 7:39 AM  Please see [email or web address] for pager details.

## 2021-01-09 NOTE — Progress Notes (Signed)
Swan-Ganz catheters containing temperature sensors and electrodes are considered MR Unsafe. Unable to safely scan pt at this time.

## 2021-01-09 NOTE — CV Procedure (Signed)
  IMPELLA REMOVAL NOTE  Impella rep at bedside.  Pump support weaned. Hemodynamics stable.   Sutures removed from sheath site.   Pump speed turned down to P-1 and pulled across the valve. Pump then turned off and pump pulled down to sheath.   Sheath and pump pulled together. Manual pressure held x 30 mins personally with good hemostasis.   Bandage and Fem Stop placed.   Glori Bickers, MD  12:35 PM

## 2021-01-09 NOTE — TOC Initial Note (Signed)
Transition of Care (TOC) - Initial/Assessment Note  Heart Failure   Patient Details  Name: [name] MRN: [id number] Date of Birth: 10-10-1950  Transition of Care [facility]) CM/SW Contact:    [place], [place] Phone Number: 01/09/2021, 4:40 PM  Clinical Narrative:           CSW received a call from [place], Ebony Hail about patient requesting to speak with social worker about his POA paperwork. CSW reached out to the patients nurse to contact the Chaplain for help with the HCPOA as unfortunately CSW cannot assist with this.   The patients nurse reported that they reached out to the [place] however the patient made his HCPOA and advanced directive prior to being admitted to the hospital with his attorney. The Chaplain is unsure of how to move forward since the forms are different but that the Chaplain is looking into it.  TOC will continue to follow for d/c needs.      Barriers to Discharge: Continued Medical Work up   Patient Goals and CMS Choice        Expected Discharge Plan and Services   In-house Referral: Clinical Social Work                                            Prior Living Arrangements/Services                       Activities of Daily Living Home Assistive Devices/Equipment: None ADL Screening (condition at time of admission) Patient's cognitive ability adequate to safely complete daily activities?: No Is the patient deaf or have difficulty hearing?: No Does the patient have difficulty seeing, even when wearing glasses/contacts?: No Does the patient have difficulty concentrating, remembering, or making decisions?: No Patient able to express need for assistance with ADLs?: No Does the patient have difficulty dressing or bathing?: No Independently performs ADLs?: No Does the patient have difficulty walking or climbing stairs?: No Weakness of Legs: None Weakness of Arms/Hands: None  Permission Sought/Granted                   Emotional Assessment         Alcohol / Substance Use: Not Applicable Psych Involvement: No (comment)  Admission diagnosis:  Cardiogenic shock ([place]) [R57.0] Patient Active Problem List   Diagnosis Date Noted  . Cardiogenic shock ([place]) 01/03/2021  . Atypical chest pain 10/25/2019  . Hyperglycemia 10/12/2019  . Coronary artery calcification seen on CAT scan 06/15/2019  . Nodule of lower lobe of left lung 06/15/2019  . Tremor of right hand 04/24/2017  . Numbness and tingling of both legs 04/24/2017  . Bilateral leg edema 06/05/2015  . Chronic neck pain 06/05/2015  . Plantar fasciitis 02/01/2014  . Visual floaters 04/04/2013  . Subjective vision disturbance, left 04/04/2013  . 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 MELANOMA, HX OF 06/01/2009  . NECK PAIN 09/11/2008  . CELLULITIS AND ABSCESS OF LEG EXCEPT FOOT 04/26/2008  . ERECTILE DYSFUNCTION 10/12/2007  . Hyperlipemia, mixed 04/12/2007  . Anxiety state 04/12/2007  . Essential hypertension 04/12/2007  . GERD 04/12/2007   PCP:  [name], MD Pharmacy:   [id number] [place], [address] [place] [address] Phone: [phone] Fax: [phone]  [facility] [address] [place] - [place]  [place] Phone: [phone] Fax: [phone]     Social Determinants of Health (SDOH) Interventions    Readmission Risk Interventions No flowsheet data found.  Varvara Legault, MSW, [place] Heart Failure Social Worker

## 2021-01-09 NOTE — Progress Notes (Addendum)
Nutrition Follow-up  DOCUMENTATION CODES:   Not applicable  INTERVENTION:   Increase Vital 1.5 to 55 ml/hr via OGT  45 ml Prosource TF TID.    Tube feeding regimen provides 2100 kcal, 122 grams of protein, and 1003 ml of H2O.   TF plus propofol provides 2307 kcals daily.   NUTRITION DIAGNOSIS:   Inadequate oral intake related to acute illness as evidenced by NPO status.  Ongoing  GOAL:   Patient will meet greater than or equal to 90% of their needs  Progressing   MONITOR:   Vent status,TF tolerance,Labs,Weight trends  REASON FOR ASSESSMENT:   Ventilator    ASSESSMENT:   70 yo male admitted with STEMI with cardiogenic shock, cardiac arrest and underwent PCI with stent to RCA, Impella placement  Patient is currently intubated on ventilator support MV: 11 L/min Temp (24hrs), Avg:99.3 F (37.4 C), Min:98.42 F (36.9 C), Max:99.7 F (37.6 C)  Propofol: 7.83 ml/hr (provides 207 kcals daily)  Reviewed I/O's: -5.8 L x 24 hours and -2.7 L since admission  UOP: 8.2 L x 24 hours  MAP: 95  Per Advanced Heart Failure Team notes, plan to remove Impella tomorrow.   Per PCCM notes, pt continues to be encephalopathic. Plan for MRI of brain tomorrow after impella removal. Pt will likely transition to comfort care, if MRI reveals anoxic brain injury.   Pt continues to receive TF via OGT: Vital 1.5 @ 40 ml/hr and 45 ml Prosource Plus TID. Regimen provides 1560 kcals, 109 grams protein, and 733 ml free water daily, meeting 73% of estimated kcal needs and 95% of estimated protein needs.   Medications reviewed and include reglan, miralax, and lasix.   Labs reviewed: K: 3.2, CBGS: 133 (inpatient orders for glycemic control are 2-6 units insulin aspart every 4 hours).   Diet Order:   Diet Order            Diet NPO time specified  Diet effective now                 EDUCATION NEEDS:   Not appropriate for education at this time  Skin:  Skin Assessment: Reviewed RN  Assessment  Last BM:  Unknown  Height:   Ht Readings from Last 1 Encounters:  01/05/21 6\' 1"  (1.854 m)    Weight:   Wt Readings from Last 1 Encounters:  01/09/21 98.8 kg   BMI:  Body mass index is 28.74 kg/m.  Estimated Nutritional Needs:   Kcal:  2134  Protein:  115-130 grams  Fluid:  >/= 1.8 L    Loistine Chance, RD, LDN, [place] Registered Dietitian II Certified Diabetes Care and Education Specialist Please refer to [facility] for RD and/or RD on-call/weekend/after hours pager

## 2021-01-09 NOTE — Progress Notes (Addendum)
Subjective: No acute events overnight.  ROS: Unable to obtain due to poor mental status  Examination  Vital signs in last 24 hours: Temp:  [99.14 F (37.3 C)-99.7 F (37.6 C)] 99.68 F (37.6 C) (04/20 1200) Pulse Rate:  [87-101] 94 (04/20 1200) Resp:  [16-26] 21 (04/20 1200) BP: (126-156)/(66-80) 136/69 (04/20 1200) SpO2:  [92 %-97 %] 97 % (04/20 1200) Arterial Line BP: (121-180)/(56-78) 132/58 (04/20 1200) FiO2 (%):  [40 %] 40 % (04/20 1124) Weight:  [98.8 kg] 98.8 kg (04/20 0424)  General: lying in bed, not in apparent distress CVS: pulse-normal rate and rhythm RS: Intubated, coarse breath sounds bilaterally Extremities: normal, warm Neuro: eyes open spontaneously, does not follow commands, PERRLA, corneal reflex intact, gag reflex intact, flaccid, winces with noxious stimuli in all extremities only  Basic Metabolic Panel: Recent Labs  Lab 01/05/21 2028 01/06/21 0408 01/06/21 0434 01/06/21 2012 01/07/21 0334 01/07/21 2440 01/08/21 0032 01/08/21 0035 01/08/21 0341 01/08/21 1237 01/08/21 2029 01/09/21 0424 01/09/21 0700  NA 137 138   < > 136 138   < > 138   < > 139 139 139 140 142  K 3.6 3.6   < > 3.6 3.6   < > 3.5   < > 3.6 3.5 3.2* 3.3* 3.2*  CL 107 106  --  106 104   < > 100  --  101 98 97*  --  100  CO2 23 23  --  24 24   < > 24  --  26 26 27   --  30  GLUCOSE 117* 116*  --  90 78   < > 123*  --  135* 167* 174*  --  168*  BUN 31* 35*  --  40* 43*   < > 51*  --  52* 54* 60*  --  66*  CREATININE 1.85* 2.23*  --  2.88* 3.14*   < > 3.69*  --  3.75* 3.92* 3.95*  --  4.08*  CALCIUM 6.7* 7.0*  --  7.4* 7.6*   < > 8.0*  --  8.0* 8.2* 7.9*  --  7.8*  MG 2.1 2.0  --  2.0 2.0  --  2.0  --  2.0  --   --   --  1.9  PHOS 5.6* 5.2*  --  6.0* 6.1*  --   --   --  7.0*  --   --   --   --    < > = values in this interval not displayed.    CBC: Recent Labs  Lab 01/05/21 0400 01/05/21 0504 01/06/21 0408 01/06/21 0434 01/07/21 0334 01/07/21 1027 01/08/21 0035  01/08/21 0341 01/09/21 0424 01/09/21 0836  WBC 23.3*  --  20.3*  --  17.2*  --   --  13.5*  --  10.9*  HGB 11.0*   < > 10.0*   < > 9.9* 9.5* 10.9* 10.9* 10.9* 10.4*  HCT 31.6*   < > 30.2*   < > 29.1* 28.0* 32.0* 33.1* 32.0* 31.5*  MCV 87.8  --  91.8  --  92.1  --   --  90.7  --  90.5  PLT 70*  --  53*  --  49*  --   --  61*  --  68*   < > = values in this interval not displayed.     Coagulation Studies: No results for input(s): LABPROT, INR in the last 72 hours.  Imaging MRI brain ordered and pending  ASSESSMENT AND PLAN: 70 year old male presented with chest painwho initially presented on 4/14/2022with chest pain and was found to have STEMI, s/p LHC andplacement of TVP for bradycardia, placement of impella, and stenting of RCA.Postop course was complicated by V. fib arrest and hemorrhagic shock at the site of Impella insertion. Today patient will need to have twitching of mouth.  Seizure-like episode Acute encephalopathy -No more twitching on face noted.  Did appear to be wincing to pain today which is an improvement -Encephalopathy likely secondary to cefepime toxicity, possible anoxic/hypoxic injury, sedation related, stroke  Recommendations: - Continue Keppra 500 mg twice daily.  Also on propofol for management of hypertension. -We will consider increasing Keppra if the twitching worsens or involves rest of the body -MRI brain without contrast ordered and pending to look for acute abnormalities -Continue seizure precautions -As needed IV Ativan 2 mg for clinical seizure-like activity lasting more than 5 minutes. Please call neuro if needed.  I have spent a total of 25 minuteswith the patient reviewing hospitalnotes,  test results, labs and examining the patient as well as establishing an assessment and plan.>50% of time was spent in direct patient care.   Zeb Comfort Epilepsy [facility] For questions after 5pm please refer to AMION to reach  the Neurologist on call

## 2021-01-09 NOTE — Progress Notes (Addendum)
Advanced Heart Failure Rounding Note  PCP-Cardiologist: No primary care provider on file.   Subjective:    Remains intubated and sedated. Gets markedly hypertensive w/ sedation wean. RN reports still not responsive w/ wean trials.    Excellent diuresis yesterday w/ -8.3L in UOP. Scr still rising, 3.95>>4.08. BUN 66.   K 3.2.   Off pressors. Still on Impella at P-6. Co-ox 65%   No significant events overnight.    Swan numbers  CVP 10 PA  20/11(15) Thermo 7.49/3.6 SVR 801   Objective:   Weight Range: 98.8 kg Body mass index is 28.74 kg/m.   Vital Signs:   Temp:  [98.42 F (36.9 C)-99.7 F (37.6 C)] 99.3 F (37.4 C) (04/20 0700) Pulse Rate:  [87-101] 87 (04/20 0757) Resp:  [15-22] 17 (04/20 0757) BP: (135-159)/(69-80) 143/78 (04/20 0600) SpO2:  [93 %-99 %] 95 % (04/20 0757) Arterial Line BP: (130-155)/(63-71) 141/65 (04/20 0700) FiO2 (%):  [40 %] 40 % (04/20 0757) Weight:  [98.8 kg] 98.8 kg (04/20 0424) Last BM Date:  (PTA)  Weight change: Filed Weights   01/07/21 0618 01/08/21 0500 01/09/21 0424  Weight: 105 kg 102.2 kg 98.8 kg    Intake/Output:   Intake/Output Summary (Last 24 hours) at 01/09/2021 0806 Last data filed at 01/09/2021 0700 Gross per 24 hour  Intake 2257.94 ml  Output 7675 ml  Net -5417.06 ml      Physical Exam    CVP 10 General:  Critically ill [name], intubated and sedated HEENT: + ETT, normal Neck: supple. + Rt IV Swan, JVD 10 cm. Carotids 2+ bilat; no bruits. No lymphadenopathy or thyromegaly appreciated. Cor: PMI nondisplaced. Regular rate & rhythm. No rubs, gallops or murmurs. Lungs: intubated and clear Abdomen: soft, nontender, nondistended. No hepatosplenomegaly. No bruits or masses. Good bowel sounds. Extremities: no cyanosis, clubbing, rash, 2+ edema, + Lt femoral impella site left femoral hematoma + Rt femoral TVP wires Neuro: intubated and sedated   Telemetry   Sinus 70s Personally reviewed  Labs    CBC Recent  Labs    01/07/21 0334 01/07/21 0613 01/08/21 0341 01/09/21 0424  WBC 17.2*  --  13.5*  --   HGB 9.9*   < > 10.9* 10.9*  HCT 29.1*   < > 33.1* 32.0*  MCV 92.1  --  90.7  --   PLT 49*  --  61*  --    < > = values in this interval not displayed.   Basic Metabolic Panel Recent Labs    01/07/21 0334 01/07/21 0613 01/08/21 0341 01/08/21 1237 01/08/21 2029 01/09/21 0424 01/09/21 0700  NA 138   < > 139   < > 139 140 142  K 3.6   < > 3.6   < > 3.2* 3.3* 3.2*  CL 104   < > 101   < > 97*  --  100  CO2 24   < > 26   < > 27  --  30  GLUCOSE 78   < > 135*   < > 174*  --  168*  BUN 43*   < > 52*   < > 60*  --  66*  CREATININE 3.14*   < > 3.75*   < > 3.95*  --  4.08*  CALCIUM 7.6*   < > 8.0*   < > 7.9*  --  7.8*  MG 2.0   < > 2.0  --   --   --  1.9  PHOS  6.1*  --  7.0*  --   --   --   --    < > = values in this interval not displayed.   Liver Function Tests Recent Labs    01/08/21 0341  AST 34  ALT 28  ALKPHOS 104  BILITOT 1.0  PROT 5.5*  ALBUMIN 1.9*   No results for input(s): LIPASE, AMYLASE in the last 72 hours. Cardiac Enzymes No results for input(s): CKTOTAL, CKMB, CKMBINDEX, TROPONINI in the last 72 hours.  BNP: BNP (last 3 results) No results for input(s): BNP in the last 8760 hours.  ProBNP (last 3 results) No results for input(s): PROBNP in the last 8760 hours.   D-Dimer No results for input(s): DDIMER in the last 72 hours. Hemoglobin A1C No results for input(s): HGBA1C in the last 72 hours. Fasting Lipid Panel Recent Labs    01/09/21 0416  TRIG 109   Thyroid Function Tests No results for input(s): TSH, T4TOTAL, T3FREE, THYROIDAB in the last 72 hours.  Invalid input(s): FREET3  Other results:   Imaging    Overnight EEG with video  Result Date: 01/08/2021 Dalton Havens, MD     01/08/2021 12:20 PM Patient Name: [name] MRN: [id number] Epilepsy Attending: [name] Referring Physician/Provider: Dr. Zeb [name] Duration:  01/07/2021 1223 to 01/08/2021 1143  Patient history: 70 year old male with seizure-like episode.  EEG to evaluate for seizures.  Level of alertness: Lethargic  AEDs during EEG study: LEV  Technical aspects: This EEG study was done with scalp electrodes positioned according to the 10-20 International system of electrode placement. Electrical activity was acquired at a sampling rate of 500Hz  and reviewed with a high frequency filter of 70Hz  and a low frequency filter of 1Hz . EEG data were recorded continuously and digitally stored.  Description: EEG showed continuous generalized 3 to 5 Hz theta-delta slowing. Generalized periodic discharges with triphasic morphology at 2.5-3 Hz were also noted.  Patient was also noted to have subtle chin/mouth tremor during the EEG.  Concomitant EEG before, during and after the event continue to show generalized periodic discharges at 2.5 to 3 Hz.  Hyperventilation and photic stimulation were not performed.   Patient was noted to have left corner of the mouth twitching intermittently throughout the study.  Concomitant EEG before, during and after the event did not show any EEG changes suggest seizure.  ABNORMALITY - Periodic discharges with triphasic morphology, generalized ( GPDs) - Continuous slow, generalized  IMPRESSION: This study showed generalized periodic discharges with triphasic morphology at 2.5 to 3 Hz which is  on the ictal-interictal continuum with moderate to high potential for seizures.    Patient was noted to have left: Twitching without concomitant EEG change.  However focal motor seizures may not be seen on scalp EEG.  Additionally there is evidence of moderate to severe diffuse encephalopathy, nonspecific etiology could be secondary to cefepime toxicity, anoxic /hypoxic brain injury.   [name]     Medications:     Scheduled Medications: . aspirin  81 mg Per Tube Daily  . chlorhexidine gluconate (MEDLINE KIT)  15 mL Mouth Rinse BID  .  Chlorhexidine Gluconate Cloth  6 each Topical Daily  . feeding supplement (PROSource TF)  45 mL Per Tube TID  . feeding supplement (VITAL 1.5 CAL)  1,000 mL Per Tube Q24H  . insulin aspart  2-6 Units Subcutaneous Q4H  . mouth rinse  15 mL Mouth Rinse 10 times per day  . metoCLOPramide (REGLAN) injection  5 mg Intravenous Q8H  . pantoprazole sodium  40 mg Per Tube Daily  . polyethylene glycol  17 g Per Tube BID  . potassium chloride  60 mEq Oral Once  . rosuvastatin  10 mg Per Tube Daily  . sodium chloride flush  10-40 mL Intracatheter Q12H  . sodium chloride flush  3 mL Intravenous Q12H  . ticagrelor  90 mg Per Tube BID    Infusions: . sodium chloride 250 mL (01/05/21 2202)  . cefTRIAXone (ROCEPHIN)  IV Stopped (01/08/21 1046)  . dextrose    . epinephrine    . furosemide (LASIX) 200 mg in dextrose 5% 100 mL (3m/mL) infusion 12 mg/hr (01/09/21 0700)  . impella catheter heparin 50 unit/mL in dextrose 5%    . levETIRAcetam Stopped (01/08/21 2220)  . midazolam Stopped (01/03/21 1300)  . norepinephrine (LEVOPHED) Adult infusion Stopped (01/07/21 1117)  . propofol (DIPRIVAN) infusion 15 mcg/kg/min (01/09/21 0700)    PRN Medications: sodium chloride, sodium chloride, acetaminophen (TYLENOL) oral liquid 160 mg/5 mL, dextrose, ondansetron (ZOFRAN) IV, sodium chloride flush, sodium chloride flush    Assessment/Plan   1. CAD/ Acute Inferior STEMI - emergent cath w/ pRCA occlusion s/p PCI + DES - Initial angiography showed sluggish flow in LAD and LCX>>improved post impella placement  - Echo LVEF 20-25%, RV moderately reduced - DAPT w/ ASA + Brilinta  - Crestor 10 per tube   2. VF Arrest - in setting of acute MI, now s/p revascularization  - no further ectopy on tele. Amio stopped - keep K > 4.0 and Mg > 2.0  - EEG with diffuse encephalopathy but no seizure activity. Per RN staff not responding to commands.  High concern for anoxic brain injury.    3. Shock, Combination  Cardiogenic + Hemorrhagic  - Acute MI + developed severe hemorrhagic shock due to bleeding at left groin Impella Transfused 4U PRBCs . hgb now stable - C/w Impella support for cardiogenic shock, now off inotrope/pressors (see discussion below)  4. Acute Systolic Heart Failure>>Cardiogenic Shock  - ICM Echo LVEF 20-25%, RV moderately reduced - On Impella at P-6. Waveforms ok.  - He is well supported on the left side with Impella. Main issue now is RV failure and marked volume overload in setting of worsening AKI (suspect ATN) - Currently off pressors/inotropes. Co-ox good at 65% - CVP 10. Continues w/ good UOP w/ lasix gtt but SCr steadily rising  - plan Impella wean today followed by brain MRI for further prognostication   5. Acute hypoxic respiratory failure - now on 40% FIO2 - vent management per PCCM   5. Nonoliguric AKI - due to ATN/shock - SCr 1.17 -> 1.6 -> 1.4 -> 1.7 -> 1.8 -> 2.2->2.9->3.1->3.69->3.75->3.95->4.08 - UOP good  - Keep SBP > 100 - Plan as above  6. Hypokalemia/hyperkalemia - K 3.2. supp gently - Mg 1.9   7. Leukocytosis - WBC 23K -> 20->17-13K. - ? Reactive from acute MI.  - continue empiric abx  8. DM2 - off insulin gtt - now on levimir and SSI  9. Encephalopathy  - post cardiac arrest - head CT negative - EEG w/ generalized periodic discharges with triphasic morphology + moderate to severe diffuse encephalopathy. ? cefepime toxicity +/- anoxic /hypoxic brain injury. Now off cefepime - neurology following   - plan brain MRI tomorrow after Impella removal for further prognostication - if anoxic brain injury, would move towards [name] care     Length of Stay: [place]  PA-C  01/09/2021, 8:06 AM  Advanced Heart Failure Team Pager [phone] (M-F; 7a - 5p)  Please contact [facility] for night-coverage after hours (5p -7a ) and weekends on [email or web address]   Agree with above.  Remains on vent. Eyes open but not responding. Remains on  Impella P-6 with good waveforms. Now off pressors. Swan numbers look good. Excellent diuresis on lasix gtt. SCr continues to rise slowly. Weight down 23 pounds  General:  On vent eyes open but not responding.   HEENT: + ETT  Neck: supple. RIJ swan. JVP up  Carotids 2+ bilat; no bruits. No lymphadenopathy or thryomegaly appreciated. Cor: PMI nondisplaced. Regular rate & rhythm. No rubs, gallops or murmurs. Lungs: clear Abdomen: soft, nontender, nondistended. No hepatosplenomegaly. No bruits or masses. Good bowel sounds. Extremities: no cyanosis, clubbing, rash, 1-2+ edema LFA Impella  Neuro: as above. + corneals and gag present  Remains intubated with Impella support. Off pressors. Hemodynamically much improved but main concern now is for severe anoxic brain injury.   Will pull Impella today and proceed with brain MRI. Still volume overloaded but with rising SCr will cut lasix down to 6.   CRITICAL CARE Performed by: Glori Bickers  Total critical care time: 35 minutes  Critical care time was exclusive of separately billable procedures and treating other patients.  Critical care was necessary to treat or prevent imminent or life-threatening deterioration.  Critical care was time spent personally by me (independent of midlevel providers or residents) on the following activities: development of treatment plan with patient and/or surrogate as well as nursing, discussions with consultants, evaluation of patient's response to treatment, examination of patient, obtaining history from patient or surrogate, ordering and performing treatments and interventions, ordering and review of laboratory studies, ordering and review of radiographic studies, pulse oximetry and re-evaluation of patient's condition.  Glori Bickers, MD  12:39 PM

## 2021-01-10 DIAGNOSIS — R569 Unspecified convulsions: Secondary | ICD-10-CM | POA: Diagnosis not present

## 2021-01-10 DIAGNOSIS — N179 Acute kidney failure, unspecified: Secondary | ICD-10-CM | POA: Diagnosis not present

## 2021-01-10 DIAGNOSIS — R570 Cardiogenic shock: Secondary | ICD-10-CM | POA: Diagnosis not present

## 2021-01-10 DIAGNOSIS — I2510 Atherosclerotic heart disease of native coronary artery without angina pectoris: Secondary | ICD-10-CM | POA: Diagnosis not present

## 2021-01-10 DIAGNOSIS — I5021 Acute systolic (congestive) heart failure: Secondary | ICD-10-CM | POA: Diagnosis not present

## 2021-01-10 LAB — POCT I-STAT 7, (LYTES, BLD GAS, ICA,H+H)
Acid-Base Excess: 8 mmol/L — ABNORMAL HIGH (ref 0.0–2.0)
Acid-Base Excess: 9 mmol/L — ABNORMAL HIGH (ref 0.0–2.0)
Bicarbonate: 31.4 mmol/L — ABNORMAL HIGH (ref 20.0–28.0)
Bicarbonate: 32.9 mmol/L — ABNORMAL HIGH (ref 20.0–28.0)
Calcium, Ion: 1 mmol/L — ABNORMAL LOW (ref 1.15–1.40)
Calcium, Ion: 1.02 mmol/L — ABNORMAL LOW (ref 1.15–1.40)
HCT: 25 % — ABNORMAL LOW (ref 39.0–52.0)
HCT: 26 % — ABNORMAL LOW (ref 39.0–52.0)
Hemoglobin: 8.5 g/dL — ABNORMAL LOW (ref 13.0–17.0)
Hemoglobin: 8.8 g/dL — ABNORMAL LOW (ref 13.0–17.0)
O2 Saturation: 95 %
O2 Saturation: 98 %
Patient temperature: 99.8
Patient temperature: 99.8
Potassium: 3.6 mmol/L (ref 3.5–5.1)
Potassium: 3.5 mmol/L (ref 3.5–5.1)
Sodium: 142 mmol/L (ref 135–145)
Sodium: 143 mmol/L (ref 135–145)
TCO2: 32 mmol/L (ref 22–32)
TCO2: 34 mmol/L — ABNORMAL HIGH (ref 22–32)
pCO2 arterial: 39.2 mmHg (ref 32.0–48.0)
pCO2 arterial: 40.2 mmHg (ref 32.0–48.0)
pH, Arterial: 7.513 — ABNORMAL HIGH (ref 7.350–7.450)
pH, Arterial: 7.523 — ABNORMAL HIGH (ref 7.350–7.450)
pO2, Arterial: 73 mmHg — ABNORMAL LOW (ref 83.0–108.0)
pO2, Arterial: 95 mmHg (ref 83.0–108.0)

## 2021-01-10 LAB — MAGNESIUM: Magnesium: 2.5 mg/dL — ABNORMAL HIGH (ref 1.7–2.4)

## 2021-01-10 LAB — TRIGLYCERIDES: Triglycerides: 118 mg/dL (ref <150)

## 2021-01-10 LAB — BASIC METABOLIC PANEL
Anion gap: 16 — ABNORMAL HIGH (ref 5–15)
BUN: 102 mg/dL — ABNORMAL HIGH (ref 8–23)
CO2: 27 mmol/L (ref 22–32)
Calcium: 7.9 mg/dL — ABNORMAL LOW (ref 8.9–10.3)
Chloride: 101 mmol/L (ref 98–111)
Creatinine, Ser: 4.95 mg/dL — ABNORMAL HIGH (ref 0.61–1.24)
GFR, Estimated: 12 mL/min — ABNORMAL LOW (ref >60)
Glucose, Bld: 177 mg/dL — ABNORMAL HIGH (ref 70–99)
Potassium: 3.4 mmol/L — ABNORMAL LOW (ref 3.5–5.1)
Sodium: 144 mmol/L (ref 135–145)

## 2021-01-10 LAB — COOXEMETRY PANEL
Carboxyhemoglobin: 1 % (ref 0.5–1.5)
Carboxyhemoglobin: 0.9 % (ref 0.5–1.5)
Methemoglobin: 1.2 % (ref 0.0–1.5)
Methemoglobin: 1.2 % (ref 0.0–1.5)
O2 Saturation: 95.6 %
O2 Saturation: 52.5 %
Total hemoglobin: 9.7 g/dL — ABNORMAL LOW (ref 12.0–16.0)
Total hemoglobin: 8.5 g/dL — ABNORMAL LOW (ref 12.0–16.0)

## 2021-01-10 LAB — COMPREHENSIVE METABOLIC PANEL
ALT: 32 U/L (ref 0–44)
AST: 39 U/L (ref 15–41)
Albumin: 1.7 g/dL — ABNORMAL LOW (ref 3.5–5.0)
Alkaline Phosphatase: 154 U/L — ABNORMAL HIGH (ref 38–126)
Anion gap: 14 (ref 5–15)
BUN: 89 mg/dL — ABNORMAL HIGH (ref 8–23)
CO2: 29 mmol/L (ref 22–32)
Calcium: 7.8 mg/dL — ABNORMAL LOW (ref 8.9–10.3)
Chloride: 101 mmol/L (ref 98–111)
Creatinine, Ser: 4.71 mg/dL — ABNORMAL HIGH (ref 0.61–1.24)
GFR, Estimated: 13 mL/min — ABNORMAL LOW (ref >60)
Glucose, Bld: 174 mg/dL — ABNORMAL HIGH (ref 70–99)
Potassium: 3.7 mmol/L (ref 3.5–5.1)
Sodium: 144 mmol/L (ref 135–145)
Total Bilirubin: 0.9 mg/dL (ref 0.3–1.2)
Total Protein: 5.1 g/dL — ABNORMAL LOW (ref 6.5–8.1)

## 2021-01-10 LAB — CBC
HCT: 27.8 % — ABNORMAL LOW (ref 39.0–52.0)
Hemoglobin: 9.2 g/dL — ABNORMAL LOW (ref 13.0–17.0)
MCH: 30.2 pg (ref 26.0–34.0)
MCHC: 33.1 g/dL (ref 30.0–36.0)
MCV: 91.1 fL (ref 80.0–100.0)
Platelets: 91 10*3/uL — ABNORMAL LOW (ref 150–400)
RBC: 3.05 MIL/uL — ABNORMAL LOW (ref 4.22–5.81)
RDW: 15.1 % (ref 11.5–15.5)
WBC: 7.9 10*3/uL (ref 4.0–10.5)
nRBC: 0 % (ref 0.0–0.2)

## 2021-01-10 LAB — GLUCOSE, CAPILLARY
Glucose-Capillary: 146 mg/dL — ABNORMAL HIGH (ref 70–99)
Glucose-Capillary: 157 mg/dL — ABNORMAL HIGH (ref 70–99)
Glucose-Capillary: 164 mg/dL — ABNORMAL HIGH (ref 70–99)
Glucose-Capillary: 166 mg/dL — ABNORMAL HIGH (ref 70–99)
Glucose-Capillary: 171 mg/dL — ABNORMAL HIGH (ref 70–99)
Glucose-Capillary: 180 mg/dL — ABNORMAL HIGH (ref 70–99)

## 2021-01-10 MED ORDER — POTASSIUM CHLORIDE 20 MEQ PO PACK
40.0000 meq | PACK | Freq: Once | ORAL | Status: AC
  Administered 2021-01-10: 40 meq
  Filled 2021-01-10: qty 2

## 2021-01-10 MED ORDER — CALCIUM GLUCONATE-NACL 1-0.675 GM/50ML-% IV SOLN
1.0000 g | Freq: Once | INTRAVENOUS | Status: AC
  Administered 2021-01-10: 1000 mg via INTRAVENOUS
  Filled 2021-01-10: qty 50

## 2021-01-10 MED ORDER — ALBUMIN HUMAN 25 % IV SOLN
25.0000 g | Freq: Four times a day (QID) | INTRAVENOUS | Status: AC
  Administered 2021-01-10 – 2021-01-11 (×4): 25 g via INTRAVENOUS
  Filled 2021-01-10 (×2): qty 100

## 2021-01-10 MED ORDER — FENTANYL CITRATE (PF) 100 MCG/2ML IJ SOLN
50.0000 ug | INTRAMUSCULAR | Status: DC | PRN
  Administered 2021-01-10 – 2021-01-11 (×2): 100 ug via INTRAVENOUS
  Administered 2021-01-12: 50 ug via INTRAVENOUS
  Filled 2021-01-10 (×3): qty 2

## 2021-01-10 NOTE — Progress Notes (Signed)
NAME:  [name], MRN:  [id number], DOB:  1951/09/10, LOS: 7 ADMISSION DATE:  01/03/2021, CONSULTATION DATE:  01/03/2021 REFERRING MD:  Dr. Haroldine Laws, CHIEF COMPLAINT:  STEMI/ cardiogenic shock  History of Present Illness:   70 year old male with PMHx HTN, HLD, CAD presenting via EMS with sudden onset of CP, found to have inferior STEMI with bradycardia and hypotension.  Taken emergently to Cath Lab on arrival.  Temporary pacer was placed with good capture; however, patient then developed profound hypotension decompensating into cardiac arrest, requiring intubation by anesthesia and CPR with episodes of VF requiring defibrillation and amiodarone. Patient was found to have total proximal RCA occlusion and underwent PCI to the RCA with stent placement.  Procedure was further c/b cardiogenic shock (requiring placement of Impella device) c/b bleeding secondary to cangrelor with development of bleeding and hematoma at puncture sites, Hgb dropped from 12.6 to 7.5 requiring emergent transfusion of 4U PRBCs and cangrelor discontinuation. Patient required vasopressors on initial presentation to ICU. Patient returned to ICU intubated and sedated in cardiogenic shock with TVP and Impella. PCCM consulted for vent management.   Pertinent  Medical History  HTN, HLD, CAD  Significant Hospital Events: Including procedures, antibiotic start and stop dates in addition to other pertinent events   . 4/14 Admitted Cards with inferior STEMI-> bradycardia s/p TVP, cardiogenic shock and cardiac arrest s/p impella with PCI/stent to RCA, complicated by bleeding s/p 4 units PRBC thought 2/2 cangrelor, pending coags.  Has R IJ PA cath, TVP R groin, impella L groin . 4/15 Remains on impella, weaning pressors and inotropes. Started diuresis due to significantly increased oxygen requirements. . 4/18 Concern for seizure-like activity, Neuro consult, LTM/EEG, CT Head NAICA/no evidence of edema. Transitioned to ceftriaxone from  cefepime. . 4/19 Remains on Impella, off pressors. Continuing diuresis with hopeful discontinuation of Impella, allowing for MRI 4/20-4/21. Continuing LTM EEG, given facial twitching/unclear neurologic status. . 4/20 >8L UOP/24H with Lasix gtt, Impella at P6, MRI Brain pending Impella removal .   Interim History / Subjective:  No events.  Remains on vent, good diuresis.  Objective   Blood pressure 133/63, pulse 74, temperature 99.8 F (37.7 C), temperature source Oral, resp. rate 16, height 6\' 1"  (1.854 m), weight 98.8 kg, SpO2 94 %. PAP: (19-24)/(10-15) 22/11 CVP:  [10 mmHg-16 mmHg] 13 mmHg CO:  [7.5 L/min] 7.5 L/min CI:  [3.6 L/min/m2] 3.6 L/min/m2  Vent Mode: PRVC FiO2 (%):  [40 %-50 %] 50 % Set Rate:  [16 bmp] 16 bmp Vt Set:  [640 mL] 640 mL PEEP:  [8 cmH20] 8 cmH20 Plateau Pressure:  [18 cmH20-24 cmH20] 22 cmH20   Intake/Output Summary (Last 24 hours) at 01/10/2021 0704 Last data filed at 01/10/2021 0600 Gross per 24 hour  Intake 1026.62 ml  Output 3325 ml  Net -2298.38 ml   Filed Weights   01/07/21 0618 01/08/21 0500 01/09/21 0424  Weight: 105 kg 102.2 kg 98.8 kg   Physical Examination: Constitutional: ill appearing  Eyes: pupils small, reactive Ears, nose, mouth, and throat: ETT in place, small secretions Cardiovascular: RRR, ext warm, mild anasarca persists Respiratory: triggers vent, scattered rhonci Gastrointestinal: soft, +BS Skin: No rashes, normal turgor, pale Neurologic: he grimaces to pain today, still no motor response, does open eyes to voice, cannot get him to track Psychiatric: cannot assess   Labs/imaging that I have personally reviewed: (right click and "Reselect all SmartList Selections" daily)  CBG okay Coox remains low BUN/Cr creeping up Low albumin  Resolved Hospital Problem  list   Hemorrhagic shock Lactic acidosis  Assessment & Plan:  Inferior STEMI post PCI and stenting RCA  Cardiogenic shock improving  Volume overloaded state of  heart improved  Hemorrhagic shock from hematoma around impella site resolved  AKI slowly worsening with diuresis  Encephalopathy with myoclonus: MRI with scattered punctate infarcts but no devastating injury.  Would give more time but MRI is not 100% sensitive for severe anoxic brain injury. Acute hypoxemic respiratory failure with encephalopathy being barrier to extubation   - Would give diuresis holiday - Albumin 100g over 24h - Will touch base with neurology and CHF team, may need more time before discussing [place] and prognosticating; I will let POA know - GDMT per CHF team, appreciate help - AEDs per neurology, appreciate help  Best practice (right click and "Reselect all SmartList Selections" daily)  Diet:  Tube Feed  Pain/Anxiety/Delirium protocol (if indicated): Yes (RASS goal 0) to -1 VAP protocol (if indicated): Yes DVT prophylaxis: Systemic AC GI prophylaxis: PPI Glucose control:  SSI Yes and Basal insulin Yes Central venous access:  Yes, and it is still needed Arterial line:  Yes, and it is still needed Foley:  Yes, and it is still needed Mobility:  bed rest  PT consulted: N/A Last date of multidisciplinary goals of care discussion [4/14 per cardiology, repeat Percival discussion with family pending MRI Brain] Code Status:  full code Disposition: ICU   Critical care time: 55 minutes   Candee Furbish, MD [facility] 01/10/21 7:04 AM  Please see [email or web address] for pager details.

## 2021-01-10 NOTE — Progress Notes (Signed)
Subjective: No acute events overnight.  Patient's friend Mr. Renato Battles at bedside today.  ROS: Unable to obtain due to poor mental status  Examination  Vital signs in last 24 hours: Temp:  [98.5 F (36.9 C)-99.8 F (37.7 C)] 99.8 F (37.7 C) (04/21 0600) Pulse Rate:  [25-95] 74 (04/21 0800) Resp:  [16-25] 17 (04/21 0800) BP: (117-143)/(62-69) 133/63 (04/20 1952) SpO2:  [92 %-100 %] 92 % (04/21 0800) Arterial Line BP: (113-165)/(49-72) 141/59 (04/21 0800) FiO2 (%):  [40 %-50 %] 40 % (04/21 0728)  General: lying in bed,not in apparent distress CVS: pulse-normal rate and rhythm GL:OVFIEPPIR, coarse breath sounds bilaterally Extremities: normal,warm Neuro:eyes open spontaneously, does not follow commands, PERRLA, corneal reflex intact, gag reflex intact, withdraws to noxious stimuli in left upper extremity and bilateral lower extremity, does not withdraw in right upper extremity  Basic Metabolic Panel: Recent Labs  Lab 01/05/21 2028 01/06/21 0408 01/06/21 0434 01/06/21 2012 01/07/21 0334 01/07/21 5188 01/08/21 0032 01/08/21 0035 01/08/21 0341 01/08/21 1237 01/08/21 2029 01/09/21 0424 01/09/21 0700 01/09/21 2016 01/10/21 0429 01/10/21 0609  NA 137 138   < > 136 138   < > 138   < > 139 139 139 140 142 143 144 142  K 3.6 3.6   < > 3.6 3.6   < > 3.5   < > 3.6 3.5 3.2* 3.3* 3.2* 3.5 3.7 3.6  CL 107 106  --  106 104   < > 100  --  101 98 97*  --  100 99 101  --   CO2 23 23  --  24 24   < > 24  --  26 26 27   --  30 29 29   --   GLUCOSE 117* 116*  --  90 78   < > 123*  --  135* 167* 174*  --  168* 128* 174*  --   BUN 31* 35*  --  40* 43*   < > 51*  --  52* 54* 60*  --  66* 79* 89*  --   CREATININE 1.85* 2.23*  --  2.88* 3.14*   < > 3.69*  --  3.75* 3.92* 3.95*  --  4.08* 4.33* 4.71*  --   CALCIUM 6.7* 7.0*  --  7.4* 7.6*   < > 8.0*  --  8.0* 8.2* 7.9*  --  7.8* 7.8* 7.8*  --   MG 2.1 2.0  --  2.0 2.0  --  2.0  --  2.0  --   --   --  1.9  --  2.5*  --   PHOS 5.6* 5.2*  --  6.0*  6.1*  --   --   --  7.0*  --   --   --   --   --   --   --    < > = values in this interval not displayed.    CBC: Recent Labs  Lab 01/06/21 0408 01/06/21 0434 01/07/21 0334 01/07/21 4166 01/08/21 0341 01/09/21 0424 01/09/21 0836 01/10/21 0429 01/10/21 0609  WBC 20.3*  --  17.2*  --  13.5*  --  10.9* 7.9  --   HGB 10.0*   < > 9.9*   < > 10.9* 10.9* 10.4* 9.2* 8.5*  HCT 30.2*   < > 29.1*   < > 33.1* 32.0* 31.5* 27.8* 25.0*  MCV 91.8  --  92.1  --  90.7  --  90.5 91.1  --   PLT 53*  --  49*  --  61*  --  68* 91*  --    < > = values in this interval not displayed.     Coagulation Studies: No results for input(s): LABPROT, INR in the last 72 hours.  Imaging MRI brain without contrast 01/09/2021: Numerous scattered punctate to small foci of infarction throughout the brain consistent with micro embolic infarctions rather than hypoxic ischemic injury generally. No evidence of hemorrhage or mass effect.     ASSESSMENT AND PLAN: 70 year old male presented with chest painwho initially presented on 4/14/2022with chest pain and was found to have STEMI, s/p LHC andplacement of TVP for bradycardia, placement of impella, and stenting of RCA.Postop course was complicated by V. fib arrest and hemorrhagic shock at the site of Impella insertion. Today patient will need to have twitching of mouth.  Seizure-like episode Acute embolic strokes Acute encephalopathy -Encephalopathy likely secondary to uremia, sedation. -Multiple punctate ischemic strokes likely cardioembolic due to left heart cath. -There appears to be an area of restricted diffusion in the left precentral gyrus/motor sulcus which is likely why patient has right upper extremity weakness  Recommendations: -ContinueKeppra 500 mg twice daily. -Patient continues to show improvement in neurologic exam, was able to move all except right upper extremity today.  However still not following commands.  We will wait 48 to 72  hours to continue to monitor for neurological improvement -Continue seizure precautions -As needed IV Ativan 2 mg for clinical seizure-like activity lasting more than 5 minutes. Please call neuro if needed.  I have spent a total of85minuteswith the patient reviewing hospitalnotes, test results, labs and examining the patient as well as establishing an assessment and plan.>50% of time was spent in direct patient care.    Zeb Comfort [facility] For questions after 5pm please refer to AMION to reach the Neurologist on call

## 2021-01-10 NOTE — Progress Notes (Addendum)
Advanced Heart Failure Rounding Note  PCP-Cardiologist: No primary care provider on file.   Subjective:   4/20 Impella removed. MRI brain with numerous micro embolic infarctions. Off pressors.   CO-OX 53%   On lasix drip at 6 mg per hour. Negative 2.2 liters. Creatinine trending up 3.9>4.1>4.3>4.7 . Overall weight down 23 pounds.   Remains intubated FiO2 50%. On Propofol.    Objective:   Weight Range: 98.8 kg Body mass index is 28.74 kg/m.   Vital Signs:   Temp:  [98.5 F (36.9 C)-99.8 F (37.7 C)] 99.8 F (37.7 C) (04/21 0600) Pulse Rate:  [25-96] 74 (04/21 0600) Resp:  [16-26] 16 (04/21 0600) BP: (117-143)/(62-80) 133/63 (04/20 1952) SpO2:  [92 %-100 %] 94 % (04/21 0600) Arterial Line BP: (113-180)/(49-78) 124/52 (04/21 0600) FiO2 (%):  [40 %-50 %] 50 % (04/21 0347) Last BM Date: 01/09/21  Weight change: Filed Weights   01/07/21 0618 01/08/21 0500 01/09/21 0424  Weight: 105 kg 102.2 kg 98.8 kg    Intake/Output:   Intake/Output Summary (Last 24 hours) at 01/10/2021 0653 Last data filed at 01/10/2021 0600 Gross per 24 hour  Intake 1094.75 ml  Output 3685 ml  Net -2590.25 ml      Physical Exam  General: Intubated.   HEENT: ETT  Neck: supple. JVP difficult to assess. Carotids 2+ bilat; no bruits. No lymphadenopathy or thryomegaly appreciated. RIJ Cor: PMI nondisplaced. Regular rate & rhythm. No rubs, gallops or murmurs. Lungs: Coarse throughout Abdomen: soft, nontender, nondistended. No hepatosplenomegaly. No bruits or masses. Good bowel sounds. Extremities: no cyanosis, clubbing, rash, Ted hose. R and LLE 1+ edema. R femoral a line . L groin ecchymotic.  Neuro: Sedated on vent.  GU: Foley   Telemetry   Sinus Rhythm 70s   Labs    CBC Recent Labs    01/09/21 0836 01/10/21 0429 01/10/21 0609  WBC 10.9* 7.9  --   HGB 10.4* 9.2* 8.5*  HCT 31.5* 27.8* 25.0*  MCV 90.5 91.1  --   PLT 68* 91*  --    Basic Metabolic Panel Recent Labs     01/08/21 0341 01/08/21 1237 01/09/21 0700 01/09/21 2016 01/10/21 0429 01/10/21 0609  NA 139   < > 142 143 144 142  K 3.6   < > 3.2* 3.5 3.7 3.6  CL 101   < > 100 99 101  --   CO2 26   < > 30 29 29   --   GLUCOSE 135*   < > 168* 128* 174*  --   BUN 52*   < > 66* 79* 89*  --   CREATININE 3.75*   < > 4.08* 4.33* 4.71*  --   CALCIUM 8.0*   < > 7.8* 7.8* 7.8*  --   MG 2.0  --  1.9  --  2.5*  --   PHOS 7.0*  --   --   --   --   --    < > = values in this interval not displayed.   Liver Function Tests Recent Labs    01/08/21 0341 01/10/21 0429  AST 34 39  ALT 28 32  ALKPHOS 104 154*  BILITOT 1.0 0.9  PROT 5.5* 5.1*  ALBUMIN 1.9* 1.7*   No results for input(s): LIPASE, AMYLASE in the last 72 hours. Cardiac Enzymes No results for input(s): CKTOTAL, CKMB, CKMBINDEX, TROPONINI in the last 72 hours.  BNP: BNP (last 3 results) No results for input(s): BNP in the last  8760 hours.  ProBNP (last 3 results) No results for input(s): PROBNP in the last 8760 hours.   D-Dimer No results for input(s): DDIMER in the last 72 hours. Hemoglobin A1C No results for input(s): HGBA1C in the last 72 hours. Fasting Lipid Panel Recent Labs    01/10/21 0429  TRIG 118   Thyroid Function Tests No results for input(s): TSH, T4TOTAL, T3FREE, THYROIDAB in the last 72 hours.  Invalid input(s): FREET3  Other results:   Imaging    DG Abd 1 View  Result Date: 01/09/2021 CLINICAL DATA:  Check gastric catheter placement EXAM: ABDOMEN - 1 VIEW COMPARISON:  None. FINDINGS: Gastric catheter is noted coiled within the stomach. Scattered bowel gas is noted. No obstructive changes are seen. Degenerative changes of lumbar spine are noted with scoliosis concave to the right. IMPRESSION: Gastric catheter coiled within the stomach. Electronically Signed   By: Inez Catalina M.D.   On: 01/09/2021 20:35   MR BRAIN WO CONTRAST  Result Date: 01/09/2021 CLINICAL DATA:  Previous cardiac arrest. Question anoxic  brain injury. EXAM: MRI HEAD WITHOUT CONTRAST TECHNIQUE: Multiplanar, multiecho pulse sequences of the brain and surrounding structures were obtained without intravenous contrast. COMPARISON:  Head CT 2 days ago. FINDINGS: Brain: Diffusion imaging shows numerous scattered small foci infarction affecting the cortical brain consistent with micro embolic infarctions rather than hypoxic ischemic injury generally. Single small focus present in the right cerebellum. Right cerebral hemisphere shows for punctate foci scattered in the parietal region. Left hemisphere shows a small cortical infarction left parietooccipital junction region and 8-10 punctate foci scattered within the frontal and parietal lobes. Small slightly larger cortical infarctions present in the left frontal and parietal vertex regions. No evidence of mass effect or hemorrhage. No evidence of pre-existing brain abnormality. No hydrocephalus or extra-axial collection. Vascular: Major vessels at the base of the brain show flow. Skull and upper cervical spine: Negative Sinuses/Orbits: Widespread sinus opacification, often seen with mechanical ventilation. Orbits negative. Other: None IMPRESSION: 1. Numerous scattered punctate to small foci of infarction throughout the brain consistent with micro embolic infarctions rather than hypoxic ischemic injury generally. No evidence of hemorrhage or mass effect. 2. Widespread sinus opacification, often seen with mechanical ventilation. Electronically Signed   By: Nelson Chimes M.D.   On: 01/09/2021 19:19     Medications:     Scheduled Medications: . aspirin  81 mg Per Tube Daily  . chlorhexidine gluconate (MEDLINE KIT)  15 mL Mouth Rinse BID  . Chlorhexidine Gluconate Cloth  6 each Topical Daily  . feeding supplement (PROSource TF)  45 mL Per Tube TID  . insulin aspart  2-6 Units Subcutaneous Q4H  . mouth rinse  15 mL Mouth Rinse 10 times per day  . metoCLOPramide (REGLAN) injection  5 mg Intravenous Q8H   . pantoprazole sodium  40 mg Per Tube Daily  . polyethylene glycol  17 g Per Tube BID  . rosuvastatin  10 mg Per Tube Daily  . sodium chloride flush  10-40 mL Intracatheter Q12H  . sodium chloride flush  3 mL Intravenous Q12H  . ticagrelor  90 mg Per Tube BID    Infusions: . sodium chloride 250 mL (01/05/21 2202)  . cefTRIAXone (ROCEPHIN)  IV Stopped (01/09/21 1144)  . dextrose    . epinephrine    . feeding supplement (VITAL 1.5 CAL) 1,000 mL (01/09/21 1508)  . furosemide (LASIX) 200 mg in dextrose 5% 100 mL (58m/mL) infusion 6 mg/hr (01/10/21 0414)  . levETIRAcetam Stopped (  01/09/21 2143)  . midazolam Stopped (01/03/21 1300)  . norepinephrine (LEVOPHED) Adult infusion Stopped (01/07/21 1117)  . propofol (DIPRIVAN) infusion 18 mcg/kg/min (01/10/21 0635)    PRN Medications: sodium chloride, sodium chloride, acetaminophen (TYLENOL) oral liquid 160 mg/5 mL, dextrose, ondansetron (ZOFRAN) IV, sodium chloride flush, sodium chloride flush    Assessment/Plan   1. CAD/ Acute Inferior STEMI - emergent cath w/ pRCA occlusion s/p PCI + DES - Initial angiography showed sluggish flow in LAD and LCX>>improved post impella placement  - Echo LVEF 20-25%, RV moderately reduced - DAPT w/ ASA + Brilinta . Hgb trending down 10.4>9.2. No obvious bleeding. CBC in am.  - Crestor 10 per tube   2. VF Arrest - in setting of acute MI, now s/p revascularization  - no further ectopy on tele. Amio stopped - keep K > 4.0 and Mg > 2.0  - EEG with diffuse encephalopathy but no seizure activity. - MRI brain micro emboli.     3. Shock, Combination Cardiogenic + Hemorrhagic  - Acute MI + developed severe hemorrhagic shock due to bleeding at left groin Impella Transfused 4U PRBCs . - Impella out 4/20   4. Acute Systolic Heart Failure>>Cardiogenic Shock  - ICM Echo LVEF 20-25%, RV moderately reduced - Impella out 4/20. Off all pressors. CO-OX 53%.  -Volume status improved. Renal function worsening.  Stop lasix drip. Weight down another 8 pounds. Overall weight down 23 pounds.  - No bb with shock.  - No spiro/arni/dig with AKI.   5. Acute hypoxic respiratory failure - now on 50% FIO2 - vent management per PCCM   5. Nonoliguric AKI - due to ATN/shock - SCr 1.17 -> 1.6 -> 1.4 -> 1.7 -> 1.8 -> 2.2->2.9->3.1->3.69->3.75->3.95->4.08->4.3>4.7  - Hold lasix drip.  -Making >2 liters urine.   6. Hypokalemia/hyperkalemia - K 3.7 . supp gently - Mg 2.5.   7. Leukocytosis - WBC 23K -> 20->17-13>7.9 K. - ? Reactive from acute MI.  - continue empiric abx  8. DM2 - off insulin gtt - now on levimir and SSI  9. Encephalopathy  - post cardiac arrest - head CT negative - EEG w/ generalized periodic discharges with triphasic morphology + moderate to severe diffuse encephalopathy. ? cefepime toxicity +/- anoxic /hypoxic brain injury. Now off cefepime - neurology following   - MRI- numerous micro emboli infarctions . No hemorrhage or mass.        Length of Stay: [place], NP  01/10/2021, 6:53 AM  Advanced Heart Failure Team Pager [phone] (M-F; 7a - 5p)  Please contact [facility] for night-coverage after hours (5p -7a ) and weekends on [email or web address]   Agree with above  Impella out. Hemodynamically stable. Will not wake up for me but RN said he may have closed his eyes on command. Brain MRI without evidence of anoxic injury   Lasix off. Creatinine up to 4.7.   General:  Intubated. Eyes open. Not following commands HEENT: normal + ETT Neck: supple.RIJ TLC  Carotids 2+ bilat; no bruits. No lymphadenopathy or thryomegaly appreciated. Cor: PMI nondisplaced. Regular rate & rhythm. No rubs, gallops or murmurs. Lungs: clear Abdomen: soft, nontender, nondistended. No hepatosplenomegaly. No bruits or masses. Good bowel sounds. Extremities: no cyanosis, clubbing, rash, 2+  Edema left groin bruising  Neuro: intubated unresponsive  Hemodynamically stable off pressors. Main issue  is his mental status. Brain MRI without evidence of anoxic injury. Agree with Neuro and CCM would give him another 48-72 hours off sedation to assess for  recovery. SCr rising but still with volume overload. Would hold lasix today. Likely will need CVHD tomorrow.   Would not Rx BP aggressively as renal and cerebral perfusion important. I am ok with SBPs in 140-150 range.   D/w HCPOA at bedside as well as Dr. Tamala Julian.   CRITICAL CARE Performed by: Glori Bickers  Total critical care time: 45 minutes  Critical care time was exclusive of separately billable procedures and treating other patients.  Critical care was necessary to treat or prevent imminent or life-threatening deterioration.  Critical care was time spent personally by me (independent of midlevel providers or residents) on the following activities: development of treatment plan with patient and/or surrogate as well as nursing, discussions with consultants, evaluation of patient's response to treatment, examination of patient, obtaining history from patient or surrogate, ordering and performing treatments and interventions, ordering and review of laboratory studies, ordering and review of radiographic studies, pulse oximetry and re-evaluation of patient's condition.   Glori Bickers, MD  9:41 AM

## 2021-01-11 ENCOUNTER — Inpatient Hospital Stay (HOSPITAL_COMMUNITY): Payer: Medicare PPO

## 2021-01-11 DIAGNOSIS — N171 Acute kidney failure with acute cortical necrosis: Secondary | ICD-10-CM

## 2021-01-11 DIAGNOSIS — Z978 Presence of other specified devices: Secondary | ICD-10-CM | POA: Diagnosis not present

## 2021-01-11 DIAGNOSIS — R570 Cardiogenic shock: Secondary | ICD-10-CM | POA: Diagnosis not present

## 2021-01-11 DIAGNOSIS — I2510 Atherosclerotic heart disease of native coronary artery without angina pectoris: Secondary | ICD-10-CM | POA: Diagnosis not present

## 2021-01-11 DIAGNOSIS — N179 Acute kidney failure, unspecified: Secondary | ICD-10-CM | POA: Diagnosis not present

## 2021-01-11 DIAGNOSIS — R569 Unspecified convulsions: Secondary | ICD-10-CM | POA: Diagnosis not present

## 2021-01-11 DIAGNOSIS — I5021 Acute systolic (congestive) heart failure: Secondary | ICD-10-CM | POA: Diagnosis not present

## 2021-01-11 LAB — CBC
HCT: 24.5 % — ABNORMAL LOW (ref 39.0–52.0)
HCT: 24 % — ABNORMAL LOW (ref 39.0–52.0)
Hemoglobin: 7.8 g/dL — ABNORMAL LOW (ref 13.0–17.0)
Hemoglobin: 7.6 g/dL — ABNORMAL LOW (ref 13.0–17.0)
MCH: 29.8 pg (ref 26.0–34.0)
MCH: 29.5 pg (ref 26.0–34.0)
MCHC: 31.8 g/dL (ref 30.0–36.0)
MCHC: 31.7 g/dL (ref 30.0–36.0)
MCV: 93.5 fL (ref 80.0–100.0)
MCV: 93 fL (ref 80.0–100.0)
Platelets: 101 10*3/uL — ABNORMAL LOW (ref 150–400)
Platelets: 114 10*3/uL — ABNORMAL LOW (ref 150–400)
RBC: 2.62 MIL/uL — ABNORMAL LOW (ref 4.22–5.81)
RBC: 2.58 MIL/uL — ABNORMAL LOW (ref 4.22–5.81)
RDW: 15.3 % (ref 11.5–15.5)
RDW: 15.2 % (ref 11.5–15.5)
WBC: 6.5 10*3/uL (ref 4.0–10.5)
WBC: 7.1 10*3/uL (ref 4.0–10.5)
nRBC: 0.3 % — ABNORMAL HIGH (ref 0.0–0.2)
nRBC: 0 % (ref 0.0–0.2)

## 2021-01-11 LAB — TRIGLYCERIDES: Triglycerides: 88 mg/dL (ref <150)

## 2021-01-11 LAB — POCT I-STAT 7, (LYTES, BLD GAS, ICA,H+H)
Acid-Base Excess: 8 mmol/L — ABNORMAL HIGH (ref 0.0–2.0)
Bicarbonate: 31.6 mmol/L — ABNORMAL HIGH (ref 20.0–28.0)
Calcium, Ion: 1.02 mmol/L — ABNORMAL LOW (ref 1.15–1.40)
HCT: 22 % — ABNORMAL LOW (ref 39.0–52.0)
Hemoglobin: 7.5 g/dL — ABNORMAL LOW (ref 13.0–17.0)
O2 Saturation: 98 %
Patient temperature: 98.2
Potassium: 3.4 mmol/L — ABNORMAL LOW (ref 3.5–5.1)
Sodium: 147 mmol/L — ABNORMAL HIGH (ref 135–145)
TCO2: 33 mmol/L — ABNORMAL HIGH (ref 22–32)
pCO2 arterial: 38.5 mmHg (ref 32.0–48.0)
pH, Arterial: 7.521 — ABNORMAL HIGH (ref 7.350–7.450)
pO2, Arterial: 90 mmHg (ref 83.0–108.0)

## 2021-01-11 LAB — BASIC METABOLIC PANEL
Anion gap: 14 (ref 5–15)
BUN: 114 mg/dL — ABNORMAL HIGH (ref 8–23)
CO2: 29 mmol/L (ref 22–32)
Calcium: 8 mg/dL — ABNORMAL LOW (ref 8.9–10.3)
Chloride: 104 mmol/L (ref 98–111)
Creatinine, Ser: 5.03 mg/dL — ABNORMAL HIGH (ref 0.61–1.24)
GFR, Estimated: 12 mL/min — ABNORMAL LOW (ref >60)
Glucose, Bld: 179 mg/dL — ABNORMAL HIGH (ref 70–99)
Potassium: 3.4 mmol/L — ABNORMAL LOW (ref 3.5–5.1)
Sodium: 147 mmol/L — ABNORMAL HIGH (ref 135–145)

## 2021-01-11 LAB — RENAL FUNCTION PANEL
Albumin: 2.8 g/dL — ABNORMAL LOW (ref 3.5–5.0)
Anion gap: 11 (ref 5–15)
BUN: 101 mg/dL — ABNORMAL HIGH (ref 8–23)
CO2: 29 mmol/L (ref 22–32)
Calcium: 8 mg/dL — ABNORMAL LOW (ref 8.9–10.3)
Chloride: 106 mmol/L (ref 98–111)
Creatinine, Ser: 4.2 mg/dL — ABNORMAL HIGH (ref 0.61–1.24)
GFR, Estimated: 14 mL/min — ABNORMAL LOW (ref >60)
Glucose, Bld: 151 mg/dL — ABNORMAL HIGH (ref 70–99)
Phosphorus: 4.6 mg/dL (ref 2.5–4.6)
Potassium: 3.5 mmol/L (ref 3.5–5.1)
Sodium: 146 mmol/L — ABNORMAL HIGH (ref 135–145)

## 2021-01-11 LAB — COOXEMETRY PANEL
Carboxyhemoglobin: 1 % (ref 0.5–1.5)
Methemoglobin: 1.1 % (ref 0.0–1.5)
O2 Saturation: 50.9 %
Total hemoglobin: 8.6 g/dL — ABNORMAL LOW (ref 12.0–16.0)

## 2021-01-11 LAB — GLUCOSE, CAPILLARY
Glucose-Capillary: 101 mg/dL — ABNORMAL HIGH (ref 70–99)
Glucose-Capillary: 157 mg/dL — ABNORMAL HIGH (ref 70–99)
Glucose-Capillary: 163 mg/dL — ABNORMAL HIGH (ref 70–99)
Glucose-Capillary: 181 mg/dL — ABNORMAL HIGH (ref 70–99)
Glucose-Capillary: 91 mg/dL (ref 70–99)
Glucose-Capillary: 98 mg/dL (ref 70–99)

## 2021-01-11 LAB — IRON AND TIBC
Iron: 20 ug/dL — ABNORMAL LOW (ref 45–182)
Saturation Ratios: 11 % — ABNORMAL LOW (ref 17.9–39.5)
TIBC: 186 ug/dL — ABNORMAL LOW (ref 250–450)
UIBC: 166 ug/dL

## 2021-01-11 LAB — FERRITIN: Ferritin: 199 ng/mL (ref 24–336)

## 2021-01-11 LAB — CK: Total CK: 316 U/L (ref 49–397)

## 2021-01-11 MED ORDER — SODIUM CHLORIDE 0.9 % FOR CRRT: INTRAVENOUS_CENTRAL | Status: DC | PRN

## 2021-01-11 MED ORDER — LORAZEPAM 2 MG/ML IJ SOLN
2.0000 mg | INTRAMUSCULAR | Status: DC | PRN
  Administered 2021-01-11 (×3): 2 mg via INTRAVENOUS
  Filled 2021-01-11 (×3): qty 1

## 2021-01-11 MED ORDER — HEPARIN SODIUM (PORCINE) 1000 UNIT/ML DIALYSIS: 1000.0000 [IU] | INTRAMUSCULAR | Status: DC | PRN

## 2021-01-11 MED ORDER — PRISMASOL BGK 4/2.5 32-4-2.5 MEQ/L EC SOLN: Status: DC

## 2021-01-11 MED ORDER — PRISMASOL BGK 4/2.5 32-4-2.5 MEQ/L REPLACEMENT SOLN: Status: DC

## 2021-01-11 MED ORDER — LEVETIRACETAM IN NACL 1000 MG/100ML IV SOLN
1000.0000 mg | Freq: Once | INTRAVENOUS | Status: AC
  Administered 2021-01-11: 1000 mg via INTRAVENOUS
  Filled 2021-01-11: qty 100

## 2021-01-11 MED ORDER — HEPARIN SODIUM (PORCINE) 1000 UNIT/ML DIALYSIS
1000.0000 [IU] | INTRAMUSCULAR | Status: DC | PRN
  Administered 2021-01-11 – 2021-01-12 (×3): 2800 [IU] via INTRAVENOUS_CENTRAL
  Administered 2021-01-13: 1000 [IU] via INTRAVENOUS_CENTRAL
  Filled 2021-01-11 (×7): qty 6

## 2021-01-11 MED ORDER — SODIUM CHLORIDE 0.9 % IV SOLN
750.0000 mg | Freq: Two times a day (BID) | INTRAVENOUS | Status: DC
  Administered 2021-01-11 – 2021-01-14 (×6): 750 mg via INTRAVENOUS
  Filled 2021-01-11 (×8): qty 7.5

## 2021-01-11 MED ORDER — POTASSIUM CHLORIDE 20 MEQ PO PACK
40.0000 meq | PACK | Freq: Once | ORAL | Status: AC
  Administered 2021-01-11: 40 meq
  Filled 2021-01-11: qty 2

## 2021-01-11 NOTE — Consult Note (Signed)
[facility] Renal Consultation Note  Requesting MD:  Indication for Consultation: Acute kidney injury, management of electrolyte abnormalities, management of acid-base abnormalities, maintenance of euvolemia.  HPI:  [name] is a 70 y.o. male.  He was admitted 01/03/2021.  Has a history of hypertension hyperlipidemia coronary artery disease presented with chest pain found to have NSTEMI.  He underwent left heart catheterization with placement of TVP for bradycardia.  He underwent placement of Impella device and stenting of the RCA.  He had a VF arrest during the procedure prompting intubation.  Once Impella began functioning he achieved ROSC.   He has an ejection fraction of 20-25% on 2D echo.  He is undergoing neurological evaluation appreciate the assistance of Dr. Hortense Ramal for diffuse encephalopathy on EEG and an MRI that revealed micro emboli.  His course is also been complicated by hemorrhagic shock and bleeding from the left groin Impella transfused 4 units packed red blood cells.  Impella device was removed 01/09/2021.  He has baseline serum creatinine which is within normal range.  He has developed acute nonoliguric renal failure.  His home medications do include Cozaar 100 mg daily.  Blood pressure 129/53 pulse 72 temperature 99.1 O2 sats 98% FiO2 40%.  Urine output 01/10/2021 2.9 L.  pH 7.5 sodium 147 potassium 3.4 chloride 104 CO2 29 BUN 114 creatinine 5.03 glucose 179 calcium 8.0 hemoglobin 7.5  Aspirin 81 mg daily, Reglan 5 mg every 8 hours, Protonix 40 mg daily, Crestor 10 mg daily, Brilinta 90 mg twice daily  IV albumin 25 g every 6 hours, Keppra 500 mg twice daily  Urine studies 01/03/2021 with bland no white cells no red blood cells negative for proteinuria.  Creat  Date/Time Value Ref Range Status  05/24/2020 08:29 AM 1.12 0.70 - 1.25 mg/dL Final    Comment:    For patients >92 years of age, the reference limit for Creatinine is approximately 13% higher for  people identified as African-American. .    Creatinine, Ser  Date/Time Value Ref Range Status  01/11/2021 04:05 AM 5.03 (H) 0.61 - 1.24 mg/dL Final  01/10/2021 04:45 PM 4.95 (H) 0.61 - 1.24 mg/dL Final  01/10/2021 04:29 AM 4.71 (H) 0.61 - 1.24 mg/dL Final  01/09/2021 08:16 PM 4.33 (H) 0.61 - 1.24 mg/dL Final  01/09/2021 07:00 AM 4.08 (H) 0.61 - 1.24 mg/dL Final  01/08/2021 08:29 PM 3.95 (H) 0.61 - 1.24 mg/dL Final  01/08/2021 12:37 PM 3.92 (H) 0.61 - 1.24 mg/dL Final  01/08/2021 03:41 AM 3.75 (H) 0.61 - 1.24 mg/dL Final  01/08/2021 12:32 AM 3.69 (H) 0.61 - 1.24 mg/dL Final  01/07/2021 11:48 AM 3.40 (H) 0.61 - 1.24 mg/dL Final  01/07/2021 03:34 AM 3.14 (H) 0.61 - 1.24 mg/dL Final  01/06/2021 08:12 PM 2.88 (H) 0.61 - 1.24 mg/dL Final  01/06/2021 04:08 AM 2.23 (H) 0.61 - 1.24 mg/dL Final  01/05/2021 08:28 PM 1.85 (H) 0.61 - 1.24 mg/dL Final  01/05/2021 04:00 PM 1.67 (H) 0.61 - 1.24 mg/dL Final  01/05/2021 04:00 AM 1.45 (H) 0.61 - 1.24 mg/dL Final  01/04/2021 07:38 PM 1.41 (H) 0.61 - 1.24 mg/dL Final  01/04/2021 06:48 PM 1.41 (H) 0.61 - 1.24 mg/dL Final  01/04/2021 11:09 AM 1.34 (H) 0.61 - 1.24 mg/dL Final  01/04/2021 07:51 AM 1.44 (H) 0.61 - 1.24 mg/dL Final  01/04/2021 03:05 AM 1.62 (H) 0.61 - 1.24 mg/dL Final  01/03/2021 11:27 PM 1.69 (H) 0.61 - 1.24 mg/dL Final  01/03/2021 11:05 PM 1.66 (H) 0.61 - 1.24  mg/dL Final  01/03/2021 08:29 PM 1.65 (H) 0.61 - 1.24 mg/dL Final  01/03/2021 06:09 PM 1.58 (H) 0.61 - 1.24 mg/dL Final  01/03/2021 11:07 AM 1.17 0.61 - 1.24 mg/dL Final  01/03/2021 10:46 AM 0.90 0.61 - 1.24 mg/dL Final  10/25/2019 02:55 PM 1.08 0.76 - 1.27 mg/dL Final  05/17/2019 09:05 AM 1.13 0.40 - 1.50 mg/dL Final  05/07/2018 09:39 AM 1.13 0.40 - 1.50 mg/dL Final  04/24/2017 08:46 AM 1.09 0.40 - 1.50 mg/dL Final  12/16/2016 02:44 PM 1.03 0.40 - 1.50 mg/dL Final  04/15/2016 09:04 AM 1.25 0.40 - 1.50 mg/dL Final  04/12/2015 09:11 AM 1.13 0.40 - 1.50 mg/dL Final  04/10/2014  09:09 AM 1.3 0.4 - 1.5 mg/dL Final  04/06/2013 08:43 AM 1.2 0.4 - 1.5 mg/dL Final  12/18/2011 08:24 AM 1.2 0.4 - 1.5 mg/dL Final  06/13/2010 08:30 AM 1.4 0.4 - 1.5 mg/dL Final  05/25/2009 08:58 AM 1.3 0.4 - 1.5 mg/dL Final  05/02/2008 09:16 AM 1.1 0.4 - 1.5 mg/dL Final  04/30/2007 08:26 AM 1.1 0.4 - 1.5 mg/dL Final     PMHx:   Past Medical History:  Diagnosis Date  . Anxiety   . Arthritis   . Cancer ([place]) 2010   basal cell ca, head  . Cataract    bilateral  . Depression   . ED (erectile dysfunction)   . GERD (gastroesophageal reflux disease)   . Hyperlipidemia   . Hypertension   . Kidney stone 07-29-11   passed   . 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 Vitreous floater    left eye, sees Dr. Dawna [facility] at [facility]     Past Surgical History:  Procedure Laterality Date  . BASAL CELL CARCINOMA EXCISION     removed from scalp  . COLONOSCOPY  07/03/2017   per Dr. Havery Moros, sessile serrated polyp, repeat in 5 yrs   . HERNIA REPAIR    . LEFT HEART CATH AND CORONARY ANGIOGRAPHY N/A 01/03/2021   Procedure: LEFT HEART CATH AND CORONARY ANGIOGRAPHY;  Surgeon: Troy Sine, MD;  Location: [facility];  Service: Cardiovascular;  Laterality: N/A;  . RIGHT HEART CATH N/A 01/03/2021   Procedure: RIGHT HEART CATH;  Surgeon: Jolaine Artist, MD;  Location: [facility];  Service: Cardiovascular;  Laterality: N/A;    Family Hx:  Family History  Problem Relation Age of Onset  . Hypertension Mother   . Hyperlipidemia Mother   . Depression Other   . Diabetes Other   . Hyperlipidemia Other   . Hypertension Other   . Alzheimer's disease Other   . Colon cancer Neg Hx     Social History:  reports that he has never smoked. He has never used smokeless tobacco. He reports that he does not drink alcohol and does not use drugs.  Allergies:  Allergies  Allergen Reactions  . Codeine Other (See Comments)    hallucinations  . Lisinopril Cough     Medications: Prior to Admission medications   Medication Sig Start Date End Date Taking? Authorizing Provider  acetaminophen (TYLENOL) 325 MG tablet Take 650 mg by mouth every 6 (six) hours as needed for mild pain.   Yes [provider]  amLODipine (NORVASC) 5 MG tablet TAKE 1 TABLET BY MOUTH EVERY DAY Patient taking differently: Take 5 mg by mouth daily. 07/04/20  Yes [name], MD  cyclobenzaprine (FLEXERIL) 10 MG tablet TAKE 1 TABLET BY MOUTH AT BEDTIME Patient taking differently:  Take 10 mg by mouth at bedtime. 09/28/20  Yes [name], MD  finasteride ([name]) 5 MG tablet Take 1 tablet (5 mg total) by mouth daily. 02/01/14  Yes [name], MD  LORazepam (ATIVAN) 2 MG tablet Take 1 tablet (2 mg total) by mouth every 6 (six) hours as needed for anxiety. 10/03/20  Yes [name], MD  losartan (COZAAR) 50 MG tablet TAKE 2 TABLETS BY MOUTH EVERY DAY Patient taking differently: Take 100 mg by mouth daily. 12/04/20  Yes [name], MD  magic mouthwash SOLN Take 10 mLs by mouth 3 (three) times daily as needed for mouth pain. 08/22/20  Yes Faustino Congress, NP  Melatonin 10 MG TABS Take 10 mg by mouth daily.   Yes [provider]  omeprazole (PRILOSEC) 20 MG capsule TAKE 1 CAPSULE (20 MG TOTAL) BY MOUTH 2 (TWO) TIMES DAILY BEFORE A MEAL. 05/01/20  Yes [name], MD  rosuvastatin (CRESTOR) 5 MG tablet TAKE 1 TABLET BY MOUTH 2-3 TIMES WEEKLY AS TOLERATED Patient taking differently: Take 5 mg by mouth See admin instructions. Take 1 tablet by mouth 2-3 times weekly as tolerated 09/06/20  Yes [name], MD  tamsulosin (FLOMAX) 0.4 MG CAPS capsule Take 0.4 mg by mouth daily.   Yes [provider]  triamcinolone cream (KENALOG) 0.1 % Apply 1 application topically 2 (two) times daily as needed (For rash).   Yes [provider]  DULoxetine (CYMBALTA) 20 MG capsule TAKE 1 CAPSULE BY MOUTH EVERY DAY Patient taking differently: Take 20  mg by mouth daily. 02/06/20   [name], MD  FLUAD 0.5 ML SUSY TO BE ADMINISTERED BY PHARMACIST FOR IMMUNIZATION 05/21/18   [provider]  tadalafil (CIALIS) 20 MG tablet Take 20 mg by mouth daily as needed.    12/17/11  [provider]     Labs:  Results for orders placed or performed during the hospital encounter of 01/03/21 (from the past 48 hour(s))  Glucose, capillary     Status: Abnormal   Collection Time: 01/09/21 11:09 AM  Result Value Ref Range   Glucose-Capillary 151 (H) 70 - 99 mg/dL    Comment: Glucose reference range applies only to samples taken after fasting for at least 8 hours.  Glucose, capillary     Status: Abnormal   Collection Time: 01/09/21  4:23 PM  Result Value Ref Range   Glucose-Capillary 127 (H) 70 - 99 mg/dL    Comment: Glucose reference range applies only to samples taken after fasting for at least 8 hours.  Glucose, capillary     Status: Abnormal   Collection Time: 01/09/21  7:29 PM  Result Value Ref Range   Glucose-Capillary 131 (H) 70 - 99 mg/dL    Comment: Glucose reference range applies only to samples taken after fasting for at least 8 hours.  Basic metabolic panel     Status: Abnormal   Collection Time: 01/09/21  8:16 PM  Result Value Ref Range   Sodium 143 135 - 145 mmol/L   Potassium 3.5 3.5 - 5.1 mmol/L   Chloride 99 98 - 111 mmol/L   CO2 29 22 - 32 mmol/L   Glucose, Bld 128 (H) 70 - 99 mg/dL    Comment: Glucose reference range applies only to samples taken after fasting for at least 8 hours.   BUN 79 (H) 8 - 23 mg/dL   Creatinine, Ser 4.33 (H) 0.61 - 1.24 mg/dL   Calcium 7.8 (L)  8.9 - 10.3 mg/dL   GFR, Estimated 14 (L) >60 mL/min    Comment: (NOTE) Calculated using the CKD-EPI Creatinine Equation (2021)    Anion gap 15 5 - 15    Comment: Performed at [facility], [place] [address] [place], [place] [address]  Glucose, capillary     Status: Abnormal   Collection Time: 01/09/21 11:09 PM  Result Value Ref Range    Glucose-Capillary 193 (H) 70 - 99 mg/dL    Comment: Glucose reference range applies only to samples taken after fasting for at least 8 hours.  Glucose, capillary     Status: Abnormal   Collection Time: 01/09/21 11:29 PM  Result Value Ref Range   Glucose-Capillary 188 (H) 70 - 99 mg/dL    Comment: Glucose reference range applies only to samples taken after fasting for at least 8 hours.  Cooxemetry Panel (carboxy, met, total hgb, O2 sat)     Status: Abnormal   Collection Time: 01/10/21  4:15 AM  Result Value Ref Range   Total hemoglobin 9.7 (L) 12.0 - 16.0 g/dL   O2 Saturation 95.6 %   Carboxyhemoglobin 1.0 0.5 - 1.5 %   Methemoglobin 1.2 0.0 - 1.5 %    Comment: Performed at [facility], [place] [address] [place], [place] [address]  Glucose, capillary     Status: Abnormal   Collection Time: 01/10/21  4:28 AM  Result Value Ref Range   Glucose-Capillary 157 (H) 70 - 99 mg/dL    Comment: Glucose reference range applies only to samples taken after fasting for at least 8 hours.  CBC     Status: Abnormal   Collection Time: 01/10/21  4:29 AM  Result Value Ref Range   WBC 7.9 4.0 - 10.5 K/uL   RBC 3.05 (L) 4.22 - 5.81 MIL/uL   Hemoglobin 9.2 (L) 13.0 - 17.0 g/dL   HCT 27.8 (L) 39.0 - 52.0 %   MCV 91.1 80.0 - 100.0 fL   MCH 30.2 26.0 - 34.0 pg   MCHC 33.1 30.0 - 36.0 g/dL   RDW 15.1 11.5 - 15.5 %   Platelets 91 (L) 150 - 400 K/uL    Comment: Immature Platelet Fraction may be clinically indicated, consider ordering this additional test [id number] CONSISTENT WITH PREVIOUS RESULT REPEATED TO VERIFY    nRBC 0.0 0.0 - 0.2 %    Comment: Performed at [facility], [place] [address], [place], [place] [address]  Comprehensive metabolic panel     Status: Abnormal   Collection Time: 01/10/21  4:29 AM  Result Value Ref Range   Sodium 144 135 - 145 mmol/L   Potassium 3.7 3.5 - 5.1 mmol/L   Chloride 101 98 - 111 mmol/L   CO2 29 22 - 32 mmol/L   Glucose, Bld 174 (H) 70 - 99 mg/dL     Comment: Glucose reference range applies only to samples taken after fasting for at least 8 hours.   BUN 89 (H) 8 - 23 mg/dL   Creatinine, Ser 4.71 (H) 0.61 - 1.24 mg/dL   Calcium 7.8 (L) 8.9 - 10.3 mg/dL   Total Protein 5.1 (L) 6.5 - 8.1 g/dL   Albumin 1.7 (L) 3.5 - 5.0 g/dL   AST 39 15 - 41 U/L   ALT 32 0 - 44 U/L   Alkaline Phosphatase 154 (H) 38 - 126 U/L   Total Bilirubin 0.9 0.3 - 1.2 mg/dL   GFR, Estimated 13 (L) >60 mL/min  Comment: (NOTE) Calculated using the CKD-EPI Creatinine Equation (2021)    Anion gap 14 5 - 15    Comment: Performed at [facility], [place] [address], [place], [place] [address]  Magnesium     Status: Abnormal   Collection Time: 01/10/21  4:29 AM  Result Value Ref Range   Magnesium 2.5 (H) 1.7 - 2.4 mg/dL    Comment: Performed at [facility] [address], [place], [place] [address]  Triglycerides     Status: None   Collection Time: 01/10/21  4:29 AM  Result Value Ref Range   Triglycerides 118 <150 mg/dL    Comment: Performed at [facility] [address] [place], [place] [address]  .Cooxemetry Panel (carboxy, met, total hgb, O2 sat)     Status: Abnormal   Collection Time: 01/10/21  5:57 AM  Result Value Ref Range   Total hemoglobin 8.5 (L) 12.0 - 16.0 g/dL   O2 Saturation 52.5 %   Carboxyhemoglobin 0.9 0.5 - 1.5 %   Methemoglobin 1.2 0.0 - 1.5 %    Comment: Performed at [facility], [place] [address], [place], [place] [address]  I-STAT 7, (LYTES, BLD GAS, ICA, H+H)     Status: Abnormal   Collection Time: 01/10/21  6:09 AM  Result Value Ref Range   pH, Arterial 7.513 (H) 7.350 - 7.450   pCO2 arterial 39.2 32.0 - 48.0 mmHg   pO2, Arterial 73 (L) 83.0 - 108.0 mmHg   Bicarbonate 31.4 (H) 20.0 - 28.0 mmol/L   TCO2 32 22 - 32 mmol/L   O2 Saturation 95.0 %   Acid-Base Excess 8.0 (H) 0.0 - 2.0 mmol/L   Sodium 142 135 - 145 mmol/L   Potassium 3.6 3.5 - 5.1 mmol/L   Calcium, Ion 1.00 (L) 1.15 - 1.40 mmol/L   HCT 25.0 (L)  39.0 - 52.0 %   Hemoglobin 8.5 (L) 13.0 - 17.0 g/dL   Patient temperature 99.8 F    Collection site Magazine features editor by Nurse    Sample type ARTERIAL   Glucose, capillary     Status: Abnormal   Collection Time: 01/10/21  8:01 AM  Result Value Ref Range   Glucose-Capillary 164 (H) 70 - 99 mg/dL    Comment: Glucose reference range applies only to samples taken after fasting for at least 8 hours.  I-STAT 7, (LYTES, BLD GAS, ICA, H+H)     Status: Abnormal   Collection Time: 01/10/21 11:56 AM  Result Value Ref Range   pH, Arterial 7.523 (H) 7.350 - 7.450   pCO2 arterial 40.2 32.0 - 48.0 mmHg   pO2, Arterial 95 83.0 - 108.0 mmHg   Bicarbonate 32.9 (H) 20.0 - 28.0 mmol/L   TCO2 34 (H) 22 - 32 mmol/L   O2 Saturation 98.0 %   Acid-Base Excess 9.0 (H) 0.0 - 2.0 mmol/L   Sodium 143 135 - 145 mmol/L   Potassium 3.5 3.5 - 5.1 mmol/L   Calcium, Ion 1.02 (L) 1.15 - 1.40 mmol/L   HCT 26.0 (L) 39.0 - 52.0 %   Hemoglobin 8.8 (L) 13.0 - 17.0 g/dL   Patient temperature 99.8 F    Collection site Magazine features editor by Operator    Sample type ARTERIAL   Glucose, capillary     Status: Abnormal   Collection Time: 01/10/21 12:45 PM  Result Value Ref Range   Glucose-Capillary 166 (H) 70 - 99 mg/dL  Comment: Glucose reference range applies only to samples taken after fasting for at least 8 hours.  Glucose, capillary     Status: Abnormal   Collection Time: 01/10/21  4:02 PM  Result Value Ref Range   Glucose-Capillary 171 (H) 70 - 99 mg/dL    Comment: Glucose reference range applies only to samples taken after fasting for at least 8 hours.  Basic metabolic panel     Status: Abnormal   Collection Time: 01/10/21  4:45 PM  Result Value Ref Range   Sodium 144 135 - 145 mmol/L   Potassium 3.4 (L) 3.5 - 5.1 mmol/L   Chloride 101 98 - 111 mmol/L   CO2 27 22 - 32 mmol/L   Glucose, Bld 177 (H) 70 - 99 mg/dL    Comment: Glucose reference range applies only to samples taken after fasting for at least 8  hours.   BUN 102 (H) 8 - 23 mg/dL   Creatinine, Ser 4.95 (H) 0.61 - 1.24 mg/dL   Calcium 7.9 (L) 8.9 - 10.3 mg/dL   GFR, Estimated 12 (L) >60 mL/min    Comment: (NOTE) Calculated using the CKD-EPI Creatinine Equation (2021)    Anion gap 16 (H) 5 - 15    Comment: Performed at [facility] [address], [place], [place] [address]  Glucose, capillary     Status: Abnormal   Collection Time: 01/10/21  8:13 PM  Result Value Ref Range   Glucose-Capillary 146 (H) 70 - 99 mg/dL    Comment: Glucose reference range applies only to samples taken after fasting for at least 8 hours.  Glucose, capillary     Status: Abnormal   Collection Time: 01/10/21 11:48 PM  Result Value Ref Range   Glucose-Capillary 180 (H) 70 - 99 mg/dL    Comment: Glucose reference range applies only to samples taken after fasting for at least 8 hours.  CBC     Status: Abnormal   Collection Time: 01/11/21  4:05 AM  Result Value Ref Range   WBC 6.5 4.0 - 10.5 K/uL   RBC 2.62 (L) 4.22 - 5.81 MIL/uL   Hemoglobin 7.8 (L) 13.0 - 17.0 g/dL   HCT 24.5 (L) 39.0 - 52.0 %   MCV 93.5 80.0 - 100.0 fL   MCH 29.8 26.0 - 34.0 pg   MCHC 31.8 30.0 - 36.0 g/dL   RDW 15.3 11.5 - 15.5 %   Platelets 101 (L) 150 - 400 K/uL    Comment: Immature Platelet Fraction may be clinically indicated, consider ordering this additional test [id number] CONSISTENT WITH PREVIOUS RESULT REPEATED TO VERIFY    nRBC 0.3 (H) 0.0 - 0.2 %    Comment: Performed at [facility], [place] [address], [place], [place] [address]  Basic metabolic panel     Status: Abnormal   Collection Time: 01/11/21  4:05 AM  Result Value Ref Range   Sodium 147 (H) 135 - 145 mmol/L   Potassium 3.4 (L) 3.5 - 5.1 mmol/L   Chloride 104 98 - 111 mmol/L   CO2 29 22 - 32 mmol/L   Glucose, Bld 179 (H) 70 - 99 mg/dL    Comment: Glucose reference range applies only to samples taken after fasting for at least 8 hours.   BUN 114 (H) 8 - 23 mg/dL   Creatinine, Ser 5.03 (H) 0.61  - 1.24 mg/dL   Calcium 8.0 (L) 8.9 - 10.3 mg/dL   GFR, Estimated 12 (L) >60 mL/min  Comment: (NOTE) Calculated using the CKD-EPI Creatinine Equation (2021)    Anion gap 14 5 - 15    Comment: Performed at [facility], [place] [address], [place], [place] [address]  Triglycerides     Status: None   Collection Time: 01/11/21  4:05 AM  Result Value Ref Range   Triglycerides 88 <150 mg/dL    Comment: Performed at [facility] [address], [place], [place] [address]  Glucose, capillary     Status: Abnormal   Collection Time: 01/11/21  4:09 AM  Result Value Ref Range   Glucose-Capillary 163 (H) 70 - 99 mg/dL    Comment: Glucose reference range applies only to samples taken after fasting for at least 8 hours.  I-STAT 7, (LYTES, BLD GAS, ICA, H+H)     Status: Abnormal   Collection Time: 01/11/21  4:11 AM  Result Value Ref Range   pH, Arterial 7.521 (H) 7.350 - 7.450   pCO2 arterial 38.5 32.0 - 48.0 mmHg   pO2, Arterial 90 83.0 - 108.0 mmHg   Bicarbonate 31.6 (H) 20.0 - 28.0 mmol/L   TCO2 33 (H) 22 - 32 mmol/L   O2 Saturation 98.0 %   Acid-Base Excess 8.0 (H) 0.0 - 2.0 mmol/L   Sodium 147 (H) 135 - 145 mmol/L   Potassium 3.4 (L) 3.5 - 5.1 mmol/L   Calcium, Ion 1.02 (L) 1.15 - 1.40 mmol/L   HCT 22.0 (L) 39.0 - 52.0 %   Hemoglobin 7.5 (L) 13.0 - 17.0 g/dL   Patient temperature 98.2 F    Sample type ARTERIAL   Cooxemetry Panel (carboxy, met, total hgb, O2 sat)     Status: Abnormal   Collection Time: 01/11/21  4:19 AM  Result Value Ref Range   Total hemoglobin 8.6 (L) 12.0 - 16.0 g/dL   O2 Saturation 50.9 %   Carboxyhemoglobin 1.0 0.5 - 1.5 %   Methemoglobin 1.1 0.0 - 1.5 %    Comment: Performed at [facility], [place] [address] [place], [place] [address]  Glucose, capillary     Status: Abnormal   Collection Time: 01/11/21  7:42 AM  Result Value Ref Range   Glucose-Capillary 181 (H) 70 - 99 mg/dL    Comment: Glucose reference range applies only to samples taken  after fasting for at least 8 hours.     ROS:  Patient intubated sedated  Physical Exam: Vitals:   01/11/21 0740 01/11/21 0800  BP:  (!) 131/55  Pulse: 73 73  Resp: 16 19  Temp: 99.1 F (37.3 C)   SpO2: 96% 99%     General: Ill-appearing gentleman intubated HEENT: Moist mucous membranes ET tube Eyes: Pupils reactive Neck: JVP elevated Heart: Regular rate and rhythm no murmurs rubs gallops Lungs: Mechanically supported breath sounds Abdomen: Soft nontender hypoactive bowel sounds Extremities: 2-3+ pitting edema lower extremities Skin: No cyanosis clubbing rash  Neuro: Responsive   Assessment/Plan: 1.Acute kidney injury.  Baseline serum creatinine 0.9 mg/dL it appears that the acute kidney injury is related to his cardiogenic/hemorrhagic shock.  He is now no longer requiring pressors.  He is nonoliguric and has responded to IV Lasix.  He is still edematous.  We have been asked to consider CRRT.  I believe this is a reasonable approach in order to help augment his volume status.  His urinalysis is bland.  We will check a renal ultrasound although I doubt this can show any evidence of any hydronephrosis or obstruction.  Avoid  nephrotoxins including IV contrast.  He did receive a left-sided heart catheterization 01/03/2021.  There is no evidence of peripheral emboli.  He was on statins and may be worth checking a CPK.  We will initiate CRRT. 2. Hypertension/volume  -still markedly volume overloaded despite a good diuretic response with IV Lasix.  Dr Jeffie Pollock has requested that CRRT is performed. 3.  Anemia we will check some iron studies 4.  Electrolytes: Potassium slightly decreased.  We will continue to follow on CRRT. 5.  Encephalopathy: Microemboli on MRI.  No evidence of seizure activities continues on Keppra 6.  Congestive heart failure with diastolic dysfunction ejection fraction 20%.  Status post stent and Impella.  Followed by cardiology 7.  Ventilator dependent respiratory  failure followed by critical care medicine   Sherril Croon 01/11/2021, 8:41 AM

## 2021-01-11 NOTE — Progress Notes (Signed)
Subjective: On CRRT today   ROS: Unable to obtain due to poor mental status  Examination  Vital signs in last 24 hours: Temp:  [98.2 F (36.8 C)-99.1 F (37.3 C)] 98.3 F (36.8 C) (04/22 1125) Pulse Rate:  [72-101] 73 (04/22 1200) Resp:  [12-25] 18 (04/22 1200) BP: (117-170)/(46-87) 130/46 (04/22 1200) SpO2:  [95 %-100 %] 96 % (04/22 1200) Arterial Line BP: (89-193)/(50-127) 139/54 (04/22 1200) FiO2 (%):  [40 %] 40 % (04/22 1125) Weight:  [98.2 kg] 98.2 kg (04/22 0500)  General: lying in bed,not in apparent distress CVS: pulse-normal rate and rhythm JI:RCVELFYBO, coarse breath sounds bilaterally Extremities: normal,warm Neuro:Opens eyes after noxious stimulation only, does not follow commands, PERRLA, corneal reflex intact, gag reflex intact, withdraws to noxious stimuli in left upper extremity and bilateral lower extremity, does not withdraw in right upper extremity  Basic Metabolic Panel: Recent Labs  Lab 01/05/21 2028 01/06/21 0408 01/06/21 0434 01/06/21 2012 01/07/21 0334 01/07/21 1751 01/08/21 0032 01/08/21 0035 01/08/21 0341 01/08/21 1237 01/09/21 0700 01/09/21 2016 01/10/21 0429 01/10/21 0609 01/10/21 1156 01/10/21 1645 01/11/21 0405 01/11/21 0411  NA 137 138   < > 136 138   < > 138   < > 139   < > 142 143 144 142 143 144 147* 147*  K 3.6 3.6   < > 3.6 3.6   < > 3.5   < > 3.6   < > 3.2* 3.5 3.7 3.6 3.5 3.4* 3.4* 3.4*  CL 107 106  --  106 104   < > 100  --  101   < > 100 99 101  --   --  101 104  --   CO2 23 23  --  24 24   < > 24  --  26   < > 30 29 29   --   --  27 29  --   GLUCOSE 117* 116*  --  90 78   < > 123*  --  135*   < > 168* 128* 174*  --   --  177* 179*  --   BUN 31* 35*  --  40* 43*   < > 51*  --  52*   < > 66* 79* 89*  --   --  102* 114*  --   CREATININE 1.85* 2.23*  --  2.88* 3.14*   < > 3.69*  --  3.75*   < > 4.08* 4.33* 4.71*  --   --  4.95* 5.03*  --   CALCIUM 6.7* 7.0*  --  7.4* 7.6*   < > 8.0*  --  8.0*   < > 7.8* 7.8* 7.8*  --   --   7.9* 8.0*  --   MG 2.1 2.0  --  2.0 2.0  --  2.0  --  2.0  --  1.9  --  2.5*  --   --   --   --   --   PHOS 5.6* 5.2*  --  6.0* 6.1*  --   --   --  7.0*  --   --   --   --   --   --   --   --   --    < > = values in this interval not displayed.    CBC: Recent Labs  Lab 01/07/21 0334 01/07/21 0258 01/08/21 0341 01/09/21 0424 01/09/21 0836 01/10/21 0429 01/10/21 0609 01/10/21 1156 01/11/21 0405 01/11/21 0411  WBC 17.2*  --  13.5*  --  10.9* 7.9  --   --  6.5  --   HGB 9.9*   < > 10.9*   < > 10.4* 9.2* 8.5* 8.8* 7.8* 7.5*  HCT 29.1*   < > 33.1*   < > 31.5* 27.8* 25.0* 26.0* 24.5* 22.0*  MCV 92.1  --  90.7  --  90.5 91.1  --   --  93.5  --   PLT 49*  --  61*  --  68* 91*  --   --  101*  --    < > = values in this interval not displayed.     Coagulation Studies: No results for input(s): LABPROT, INR in the last 72 hours.  Imaging No new brain imaging overnight  ASSESSMENT AND PLAN:  ASSESSMENT AND PLAN: 70 year old male presented with chest painwho initially presented on 4/14/2022with chest pain and was found to have STEMI, s/p LHC andplacement of TVP for bradycardia, placement of impella, and stenting of RCA.Postop course was complicated by V. fib arrest and hemorrhagic shock at the site of Impella insertion. Today patient will need to have twitching of mouth.  Seizure-like episode Acute embolic strokes Acute encephalopathy AKI -Encephalopathy likely secondary to uremia, sedation. -Multiple punctate ischemic strokes likely cardioembolic due to left heart cath. -There appears to be an area of restricted diffusion in the left precentral gyrus/motor sulcus which is likely why patient has right upper extremity weakness  Recommendations: -ContinueKeppra 500 mg twice daily. - Patient continues to show improvement in neurologic exam, was able to move all except right upper extremity today.  However still not following commands.  We will wait till Monday and continue to  monitor for neurological improvement -Continue seizure precautions -As needed IV Ativan 2 mg for clinical seizure-like activity lasting more than 5 minutes. Please call neuro if needed.  I have spent a total of66minuteswith the patient reviewing hospitalnotes, test results, labs and examining the patient as well as establishing an assessment and plan.>50% of time was spent in direct patient care.  Zeb Comfort Epilepsy [facility] For questions after 5pm please refer to AMION to reach the Neurologist on call

## 2021-01-11 NOTE — Progress Notes (Addendum)
NAME:  [name], MRN:  [id number], DOB:  [birth year], LOS: 8 ADMISSION DATE:  01/03/2021, CONSULTATION DATE:  01/03/2021 REFERRING MD:  Dr. Haroldine Laws, CHIEF COMPLAINT:  STEMI/ cardiogenic shock  History of Present Illness:   70 year old male with PMHx HTN, HLD, CAD presenting via EMS with sudden onset of CP, found to have inferior STEMI with bradycardia and hypotension.  Taken emergently to Cath Lab on arrival.  Temporary pacer was placed with good capture; however, patient then developed profound hypotension decompensating into cardiac arrest, requiring intubation by anesthesia and CPR with episodes of VF requiring defibrillation and amiodarone. Patient was found to have total proximal RCA occlusion and underwent PCI to the RCA with stent placement.  Procedure was further c/b cardiogenic shock (requiring placement of Impella device) c/b bleeding secondary to cangrelor with development of bleeding and hematoma at puncture sites, Hgb dropped from 12.6 to 7.5 requiring emergent transfusion of 4U PRBCs and cangrelor discontinuation. Patient required vasopressors on initial presentation to ICU. Patient returned to ICU intubated and sedated in cardiogenic shock with TVP and Impella. PCCM consulted for vent management.   Pertinent  Medical History  HTN, HLD, CAD  Significant Hospital Events: Including procedures, antibiotic start and stop dates in addition to other pertinent events   . 4/14 Admitted Cards with inferior STEMI-> bradycardia s/p TVP, cardiogenic shock and cardiac arrest s/p impella with PCI/stent to RCA, complicated by bleeding s/p 4 units PRBC thought 2/2 cangrelor, pending coags.  Has R IJ PA cath, TVP R groin, impella L groin . 4/15 Remains on impella, weaning pressors and inotropes. Started diuresis due to significantly increased oxygen requirements. . 4/18 Concern for seizure-like activity, Neuro consult, LTM/EEG, CT Head NAICA/no evidence of edema. Transitioned to ceftriaxone from  cefepime. . 4/19 Remains on Impella, off pressors. Continuing diuresis with hopeful discontinuation of Impella, allowing for MRI 4/20-4/21. Continuing LTM EEG, given facial twitching/unclear neurologic status. . 4/20 >8L UOP/24H with Lasix gtt, Impella at P6, MRI Brain pending Impella removal .   Interim History / Subjective:  No events. Waking up more. BUN/Cr up, still has extra volume to give.  Objective   Blood pressure (!) 134/57, pulse 73, temperature 99.1 F (37.3 C), temperature source Axillary, resp. rate 16, height 6\' 1"  (1.854 m), weight 98.2 kg, SpO2 96 %. CVP:  [13 mmHg] 13 mmHg  Vent Mode: PRVC FiO2 (%):  [40 %] 40 % Set Rate:  [10 bmp-18 bmp] 10 bmp Vt Set:  [640 mL] 640 mL PEEP:  [5 cmH20-8 cmH20] 5 cmH20 Plateau Pressure:  [15 KCL27-51 cmH20] 28 cmH20   Intake/Output Summary (Last 24 hours) at 01/11/2021 7001 Last data filed at 01/11/2021 0800 Gross per 24 hour  Intake 2242.41 ml  Output 2390 ml  Net -147.59 ml   Filed Weights   01/08/21 0500 01/09/21 0424 01/11/21 0500  Weight: 102.2 kg 98.8 kg 98.2 kg   Physical Examination: Constitutional: ill appearing  Eyes: pupils small, reactive Ears, nose, mouth, and throat: ETT in place, copious purulent secretions Cardiovascular: RRR, ext warm, mild anasarca persists Respiratory: triggers vent, scattered rhonci Gastrointestinal: soft, +BS Skin: No rashes, normal turgor, pale Neurologic: he localized and wiggled toes to pain for me today Psychiatric: cannot assess   Labs/imaging that I have personally reviewed: (right click and "Reselect all SmartList Selections" daily)  BUN/Cr up Alkalotic on ABG regardless of vent mode used Plts imporved Hgb drifting down  Resolved Hospital Problem list   Hemorrhagic shock Lactic acidosis  Assessment & Plan:  Inferior STEMI post PCI and stenting RCA  Cardiogenic shock improving  Volume overloaded state of heart improved  Hemorrhagic shock from hematoma around  impella site resolvedbut H/H creeping back down, will need to keep eye on  Hypervolemic AKI slowly worsening with diuresis; now will need to use Encephalopathy with myoclonus: MRI with scattered punctate infarcts but no devastating injury.  Would give more time but MRI is not 100% sensitive for severe anoxic brain injury. Acute hypoxemic respiratory failure with encephalopathy being barrier to extubation  Question HCAP- check tracheal aspirate  - Watch H/H - Femoral lines out - Triple lumen into RIJ introducer - LIJ HD line; POA [name] consented - Nephrology consultation for CRRT - GDMT per CHF team, appreciate help - AEDs per neurology, appreciate help - Once fluid is off, can consider SAT/SBT depending on mental status  Best practice (right click and "Reselect all SmartList Selections" daily)  Diet:  Tube Feed  Pain/Anxiety/Delirium protocol (if indicated): Yes (RASS goal 0) to -1 VAP protocol (if indicated): Yes DVT prophylaxis: Systemic AC GI prophylaxis: PPI Glucose control:  SSI Yes and Basal insulin Yes Central venous access:  Yes, and it is still needed Arterial line:  Yes, and it is still needed Foley:  Yes, and it is still needed Mobility:  bed rest  PT consulted: N/A Last date of multidisciplinary goals of care discussion [ongoing] Code Status:  full code Disposition: ICU   Critical care time: 45 minutes   Candee Furbish, MD [facility] 01/11/21 8:38 AM  Please see [email or web address] for pager details.

## 2021-01-11 NOTE — Plan of Care (Signed)
Critical care team notified that patient was noted to have rhythmic jerking and eye fluttering. Ordered LTM eeg, one time dose of 1000mg  LEV and increased maintenance to 750mg  BID.  Michayla Mcneil Barbra Sarks

## 2021-01-11 NOTE — Progress Notes (Signed)
LTM EEG hooked up and running - no initial skin breakdown - push button tested - neuro notified.  

## 2021-01-11 NOTE — Plan of Care (Signed)
  Problem: Clinical Measurements: Goal: Will remain free from infection Outcome: Progressing Goal: Cardiovascular complication will be avoided Outcome: Progressing   Problem: Nutrition: Goal: Adequate nutrition will be maintained Outcome: Progressing   Problem: Elimination: Goal: Will not experience complications related to bowel motility Outcome: Progressing Goal: Will not experience complications related to urinary retention Outcome: Progressing   Problem: Skin Integrity: Goal: Risk for impaired skin integrity will decrease Outcome: Progressing   Problem: Cardiac: Goal: Ability to achieve and maintain adequate cardiopulmonary perfusion will improve Outcome: Progressing Goal: Vascular access site(s) Level 0-1 will be maintained Outcome: Progressing   Problem: Respiratory: Goal: Ability to maintain a clear airway and adequate ventilation will improve Outcome: Progressing   Problem: Clinical Measurements: Goal: Diagnostic test results will improve Outcome: Not Progressing

## 2021-01-11 NOTE — Progress Notes (Addendum)
  Advanced Heart Failure Rounding Note  PCP-Cardiologist: No primary care provider on file.   Subjective:   4/20 Impella removed. MRI brain with numerous micro embolic infarctions. Off pressors.  4/21 lasix drip stopped.   CO-OX 51%    Creatinine rising 5 today. Making >2 liters urine.   Remains intubated FiO2 40%.Off sedation.  Unresponsive.   Objective:   Weight Range: 98.2 kg Body mass index is 28.56 kg/m.   Vital Signs:   Temp:  [98.2 F (36.8 C)-98.5 F (36.9 C)] 98.2 F (36.8 C) (04/22 0400) Pulse Rate:  [72-101] 76 (04/22 0700) Resp:  [12-23] 18 (04/22 0700) BP: (121-170)/(54-87) 134/57 (04/22 0700) SpO2:  [92 %-100 %] 98 % (04/22 0700) Arterial Line BP: (126-193)/(50-127) 137/56 (04/22 0700) FiO2 (%):  [40 %] 40 % (04/22 0341) Weight:  [98.2 kg] 98.2 kg (04/22 0500) Last BM Date: 01/11/21  Weight change: Filed Weights   01/08/21 0500 01/09/21 0424 01/11/21 0500  Weight: 102.2 kg 98.8 kg 98.2 kg    Intake/Output:   Intake/Output Summary (Last 24 hours) at 01/11/2021 0720 Last data filed at 01/11/2021 0600 Gross per 24 hour  Intake 2089.26 ml  Output 2375 ml  Net -285.74 ml      Physical Exam  General: Intubated, ETT  HEENT: ETT Neck: supple. JVP 9-10 . RIJ   Carotids 2+ bilat; no bruits. No lymphadenopathy or thryomegaly appreciated. Cor: PMI nondisplaced. Regular rate & rhythm. No rubs, gallops or murmurs. Lungs: Coarse throughout   Abdomen: soft, nontender, nondistended. No hepatosplenomegaly. No bruits or masses. Good bowel sounds. Extremities: no cyanosis, clubbing, rash, R and LLE 2-3+ edema Neuro: Unresponsive.   Telemetry   SR 70-90s    Labs    CBC Recent Labs    01/10/21 0429 01/10/21 0609 01/11/21 0405 01/11/21 0411  WBC 7.9  --  6.5  --   HGB 9.2*   < > 7.8* 7.5*  HCT 27.8*   < > 24.5* 22.0*  MCV 91.1  --  93.5  --   PLT 91*  --  101*  --    < > = values in this interval not displayed.   Basic Metabolic Panel Recent  Labs    01/09/21 0700 01/09/21 2016 01/10/21 0429 01/10/21 0609 01/10/21 1645 01/11/21 0405 01/11/21 0411  NA 142   < > 144   < > 144 147* 147*  K 3.2*   < > 3.7   < > 3.4* 3.4* 3.4*  CL 100   < > 101  --  101 104  --   CO2 30   < > 29  --  27 29  --   GLUCOSE 168*   < > 174*  --  177* 179*  --   BUN 66*   < > 89*  --  102* 114*  --   CREATININE 4.08*   < > 4.71*  --  4.95* 5.03*  --   CALCIUM 7.8*   < > 7.8*  --  7.9* 8.0*  --   MG 1.9  --  2.5*  --   --   --   --    < > = values in this interval not displayed.   Liver Function Tests Recent Labs    01/10/21 0429  AST 39  ALT 32  ALKPHOS 154*  BILITOT 0.9  PROT 5.1*  ALBUMIN 1.7*   No results for input(s): LIPASE, AMYLASE in the last 72 hours. Cardiac Enzymes No results for input(s):   CKTOTAL, CKMB, CKMBINDEX, TROPONINI in the last 72 hours.  BNP: BNP (last 3 results) No results for input(s): BNP in the last 8760 hours.  ProBNP (last 3 results) No results for input(s): PROBNP in the last 8760 hours.   D-Dimer No results for input(s): DDIMER in the last 72 hours. Hemoglobin A1C No results for input(s): HGBA1C in the last 72 hours. Fasting Lipid Panel Recent Labs    01/11/21 0405  TRIG 88   Thyroid Function Tests No results for input(s): TSH, T4TOTAL, T3FREE, THYROIDAB in the last 72 hours.  Invalid input(s): FREET3  Other results:   Imaging    No results found.   Medications:     Scheduled Medications: . aspirin  81 mg Per Tube Daily  . chlorhexidine gluconate (MEDLINE KIT)  15 mL Mouth Rinse BID  . Chlorhexidine Gluconate Cloth  6 each Topical Daily  . feeding supplement (PROSource TF)  45 mL Per Tube TID  . insulin aspart  2-6 Units Subcutaneous Q4H  . mouth rinse  15 mL Mouth Rinse 10 times per day  . metoCLOPramide (REGLAN) injection  5 mg Intravenous Q8H  . pantoprazole sodium  40 mg Per Tube Daily  . polyethylene glycol  17 g Per Tube BID  . rosuvastatin  10 mg Per Tube Daily  .  sodium chloride flush  10-40 mL Intracatheter Q12H  . sodium chloride flush  3 mL Intravenous Q12H  . ticagrelor  90 mg Per Tube BID    Infusions: . sodium chloride 250 mL (01/05/21 2202)  . dextrose    . feeding supplement (VITAL 1.5 CAL) 1,000 mL (01/11/21 0618)  . levETIRAcetam Stopped (01/11/21 0006)  . propofol (DIPRIVAN) infusion Stopped (01/10/21 0731)    PRN Medications: sodium chloride, sodium chloride, acetaminophen (TYLENOL) oral liquid 160 mg/5 mL, dextrose, fentaNYL (SUBLIMAZE) injection, ondansetron (ZOFRAN) IV, sodium chloride flush, sodium chloride flush    Assessment/Plan   1. CAD/ Acute Inferior STEMI - emergent cath w/ pRCA occlusion s/p PCI + DES - Initial angiography showed sluggish flow in LAD and LCX>>improved post impella placement  - Echo LVEF 20-25%, RV moderately reduced - DAPT w/ ASA + Brilinta . Hgb trending down 10.4>9.2>7.8 . No obvious bleeding.  - Crestor 10 per tube   2. VF Arrest - in setting of acute MI, now s/p revascularization  - no further ectopy on tele. Amio stopped - keep K > 4.0 and Mg > 2.0  - EEG with diffuse encephalopathy but no seizure activity. - MRI brain micro emboli.    3. Shock, Combination Cardiogenic + Hemorrhagic  - Acute MI + developed severe hemorrhagic shock due to bleeding at left groin Impella Transfused 4U PRBCs . - Impella out 4/20  - hgb down to 7.8. Repeat CBC at 1200 if hgb lower will transfuse. No obvious source of bleeding.   4. Acute Systolic Heart Failure>>Cardiogenic Shock  - ICM Echo LVEF 20-25%, RV moderately reduced - Impella out 4/20. Off all pressors.  -Volume status improved. Renal function rising. Off diuretics.  - No bb with shock.  - No spiro/arni/dig with AKI.   5. Acute hypoxic respiratory failure - now on 50% FIO2 - vent management per PCCM   5. Nonoliguric AKI - due to ATN/shock - SCr 1.17 -> 1.6 -> 1.4 -> 1.7 -> 1.8 -> 2.2->2.9->3.1->3.69->3.75->3.95->4.08->4.3>4.7 >4.95> 5 .  -  Making >2 liters of urine.  - May need nephrology.   6. Hypokalemia/hyperkalemia - K 3.4   7. Leukocytosis - WBC  23K -> 20->17-13>7.9>6.5  K. - ? Reactive from acute MI.  - continue empiric abx  8. DM2 - off insulin gtt - now on levimir and SSI  9. Encephalopathy -->uremia, acute embolic strokes.  - post cardiac arrest - head CT negative - EEG w/ generalized periodic discharges with triphasic morphology + moderate to severe diffuse encephalopathy. ? cefepime toxicity +/- anoxic /hypoxic brain injury. Now off cefepime - neurology following   - MRI- numerous micro emboli infarctions . No hemorrhage or mass.  - On keppra   Length of Stay: [place], NP  01/11/2021, 7:20 AM  Advanced Heart Failure Team Pager [phone] (M-F; 7a - 5p)  Please contact [facility] for night-coverage after hours (5p -7a ) and weekends on [email or web address]  Agree with above   Remains on vent. Was unresponsive for me but according to Neuro eval would withdraw all extremities except RUE.   Continues to diurese off lasix. Hemodynamically stable. SCr rising. CVP 14  General:  Intubated sedated  HEENT: normal + ETT Neck: supple. JVP to jaw Carotids 2+ bilat; no bruits. No lymphadenopathy or thryomegaly appreciated. Cor: PMI nondisplaced. Regular rate & rhythm. No rubs, gallops or murmurs. Lungs: clear Abdomen: soft, nontender, nondistended. No hepatosplenomegaly. No bruits or masses. Good bowel sounds. Extremities: no cyanosis, clubbing, rash, 2+ edema  Left groin ecchymosis  R groin sheaths Neuro: unresponsive for me  Neuro status seems to be improving. Agree with continued supportive care. Will need CVVHD today to help remove volume and reduce any uremia which may be hindering our ability to assess his neuro status.   Co-ox marginal. Will continue to follow. May need to restart milrinone. Letting SBP run 130-140 to preserve renal and neuro perfusion.   Watch hgb.   D/w Dr. Tamala Julian.   CRITICAL  CARE Performed by: Glori Bickers  Total critical care time: 35 minutes  Critical care time was exclusive of separately billable procedures and treating other patients.  Critical care was necessary to treat or prevent imminent or life-threatening deterioration.  Critical care was time spent personally by me (independent of midlevel providers or residents) on the following activities: development of treatment plan with patient and/or surrogate as well as nursing, discussions with consultants, evaluation of patient's response to treatment, examination of patient, obtaining history from patient or surrogate, ordering and performing treatments and interventions, ordering and review of laboratory studies, ordering and review of radiographic studies, pulse oximetry and re-evaluation of patient's condition.  Glori Bickers, MD  8:45 AM

## 2021-01-11 NOTE — Progress Notes (Signed)
Holding K+ replacement for a K of 3.4 per Cardiology as CRRT may be started in the morning

## 2021-01-11 NOTE — Procedures (Signed)
Central Venous Catheter Insertion Procedure Note  Deondrae Mcgrail  [id number]  1951-06-22  Date:01/11/21  Time:2:50 PM   Provider Performing:Derionna Salvador Cipriano Mile   Procedure: Insertion of Non-tunneled Central Venous [phone]) with US guidance ([id number])   Indication(s) Hemodialysis  Consent Risks of the procedure as well as the alternatives and risks of each were explained to the patient and/or caregiver.  Consent for the procedure was obtained and is signed in the bedside chart  Anesthesia Topical only with 1% lidocaine   Timeout Verified patient identification, verified procedure, site/side was marked, verified correct patient position, special equipment/implants available, medications/allergies/relevant history reviewed, required imaging and test results available.  Sterile Technique Maximal sterile technique including full sterile barrier drape, hand hygiene, sterile gown, sterile gloves, mask, hair covering, sterile ultrasound probe cover (if used).  Procedure Description Area of catheter insertion was cleaned with chlorhexidine and draped in sterile fashion.  With real-time ultrasound guidance a HD catheter was placed into the left internal jugular vein. Nonpulsatile blood flow and easy flushing noted in all ports.  The catheter was sutured in place and sterile dressing applied.  Complications/Tolerance None; patient tolerated the procedure well. Chest X-ray is ordered to verify placement for internal jugular or subclavian cannulation.   Chest x-ray is not ordered for femoral cannulation.  EBL Minimal  Specimen(s) None

## 2021-01-12 DIAGNOSIS — R570 Cardiogenic shock: Secondary | ICD-10-CM | POA: Diagnosis not present

## 2021-01-12 DIAGNOSIS — R569 Unspecified convulsions: Secondary | ICD-10-CM | POA: Diagnosis not present

## 2021-01-12 DIAGNOSIS — N179 Acute kidney failure, unspecified: Secondary | ICD-10-CM | POA: Diagnosis not present

## 2021-01-12 DIAGNOSIS — I5041 Acute combined systolic (congestive) and diastolic (congestive) heart failure: Secondary | ICD-10-CM

## 2021-01-12 LAB — POCT I-STAT 7, (LYTES, BLD GAS, ICA,H+H)
Acid-Base Excess: 8 mmol/L — ABNORMAL HIGH (ref 0.0–2.0)
Acid-Base Excess: 6 mmol/L — ABNORMAL HIGH (ref 0.0–2.0)
Acid-Base Excess: 4 mmol/L — ABNORMAL HIGH (ref 0.0–2.0)
Acid-Base Excess: 4 mmol/L — ABNORMAL HIGH (ref 0.0–2.0)
Bicarbonate: 29.4 mmol/L — ABNORMAL HIGH (ref 20.0–28.0)
Bicarbonate: 29.4 mmol/L — ABNORMAL HIGH (ref 20.0–28.0)
Bicarbonate: 27 mmol/L (ref 20.0–28.0)
Bicarbonate: 27.4 mmol/L (ref 20.0–28.0)
Calcium, Ion: 1.13 mmol/L — ABNORMAL LOW (ref 1.15–1.40)
Calcium, Ion: 1.1 mmol/L — ABNORMAL LOW (ref 1.15–1.40)
Calcium, Ion: 1.09 mmol/L — ABNORMAL LOW (ref 1.15–1.40)
Calcium, Ion: 1.08 mmol/L — ABNORMAL LOW (ref 1.15–1.40)
HCT: 23 % — ABNORMAL LOW (ref 39.0–52.0)
HCT: 26 % — ABNORMAL LOW (ref 39.0–52.0)
HCT: 25 % — ABNORMAL LOW (ref 39.0–52.0)
HCT: 25 % — ABNORMAL LOW (ref 39.0–52.0)
Hemoglobin: 7.8 g/dL — ABNORMAL LOW (ref 13.0–17.0)
Hemoglobin: 8.8 g/dL — ABNORMAL LOW (ref 13.0–17.0)
Hemoglobin: 8.5 g/dL — ABNORMAL LOW (ref 13.0–17.0)
Hemoglobin: 8.5 g/dL — ABNORMAL LOW (ref 13.0–17.0)
O2 Saturation: 99 %
O2 Saturation: 98 %
O2 Saturation: 98 %
O2 Saturation: 98 %
Patient temperature: 98.6
Patient temperature: 98.8
Patient temperature: 98
Patient temperature: 98.3
Potassium: 3.4 mmol/L — ABNORMAL LOW (ref 3.5–5.1)
Potassium: 4 mmol/L (ref 3.5–5.1)
Potassium: 4.1 mmol/L (ref 3.5–5.1)
Potassium: 4.3 mmol/L (ref 3.5–5.1)
Sodium: 138 mmol/L (ref 135–145)
Sodium: 143 mmol/L (ref 135–145)
Sodium: 143 mmol/L (ref 135–145)
Sodium: 142 mmol/L (ref 135–145)
TCO2: 30 mmol/L (ref 22–32)
TCO2: 31 mmol/L (ref 22–32)
TCO2: 28 mmol/L (ref 22–32)
TCO2: 28 mmol/L (ref 22–32)
pCO2 arterial: 27.6 mmHg — ABNORMAL LOW (ref 32.0–48.0)
pCO2 arterial: 38.3 mmHg (ref 32.0–48.0)
pCO2 arterial: 33.3 mmHg (ref 32.0–48.0)
pCO2 arterial: 34 mmHg (ref 32.0–48.0)
pH, Arterial: 7.637 (ref 7.350–7.450)
pH, Arterial: 7.493 — ABNORMAL HIGH (ref 7.350–7.450)
pH, Arterial: 7.516 — ABNORMAL HIGH (ref 7.350–7.450)
pH, Arterial: 7.513 — ABNORMAL HIGH (ref 7.350–7.450)
pO2, Arterial: 111 mmHg — ABNORMAL HIGH (ref 83.0–108.0)
pO2, Arterial: 95 mmHg (ref 83.0–108.0)
pO2, Arterial: 93 mmHg (ref 83.0–108.0)
pO2, Arterial: 86 mmHg (ref 83.0–108.0)

## 2021-01-12 LAB — RENAL FUNCTION PANEL
Albumin: 2.6 g/dL — ABNORMAL LOW (ref 3.5–5.0)
Albumin: 2.5 g/dL — ABNORMAL LOW (ref 3.5–5.0)
Anion gap: 8 (ref 5–15)
Anion gap: 11 (ref 5–15)
BUN: 73 mg/dL — ABNORMAL HIGH (ref 8–23)
BUN: 55 mg/dL — ABNORMAL HIGH (ref 8–23)
CO2: 29 mmol/L (ref 22–32)
CO2: 25 mmol/L (ref 22–32)
Calcium: 8.1 mg/dL — ABNORMAL LOW (ref 8.9–10.3)
Calcium: 8.1 mg/dL — ABNORMAL LOW (ref 8.9–10.3)
Chloride: 104 mmol/L (ref 98–111)
Chloride: 105 mmol/L (ref 98–111)
Creatinine, Ser: 2.99 mg/dL — ABNORMAL HIGH (ref 0.61–1.24)
Creatinine, Ser: 2.11 mg/dL — ABNORMAL HIGH (ref 0.61–1.24)
GFR, Estimated: 22 mL/min — ABNORMAL LOW (ref >60)
GFR, Estimated: 33 mL/min — ABNORMAL LOW (ref >60)
Glucose, Bld: 147 mg/dL — ABNORMAL HIGH (ref 70–99)
Glucose, Bld: 128 mg/dL — ABNORMAL HIGH (ref 70–99)
Phosphorus: 2.9 mg/dL (ref 2.5–4.6)
Phosphorus: 3.3 mg/dL (ref 2.5–4.6)
Potassium: 3.7 mmol/L (ref 3.5–5.1)
Potassium: 4.4 mmol/L (ref 3.5–5.1)
Sodium: 141 mmol/L (ref 135–145)
Sodium: 141 mmol/L (ref 135–145)

## 2021-01-12 LAB — COOXEMETRY PANEL
Carboxyhemoglobin: 1.1 % (ref 0.5–1.5)
Methemoglobin: 1 % (ref 0.0–1.5)
O2 Saturation: 63.6 %
Total hemoglobin: 8.6 g/dL — ABNORMAL LOW (ref 12.0–16.0)

## 2021-01-12 LAB — CBC
HCT: 26.5 % — ABNORMAL LOW (ref 39.0–52.0)
Hemoglobin: 8.3 g/dL — ABNORMAL LOW (ref 13.0–17.0)
MCH: 29.7 pg (ref 26.0–34.0)
MCHC: 31.3 g/dL (ref 30.0–36.0)
MCV: 95 fL (ref 80.0–100.0)
Platelets: 155 10*3/uL (ref 150–400)
RBC: 2.79 MIL/uL — ABNORMAL LOW (ref 4.22–5.81)
RDW: 15.1 % (ref 11.5–15.5)
WBC: 10.3 10*3/uL (ref 4.0–10.5)
nRBC: 0 % (ref 0.0–0.2)

## 2021-01-12 LAB — GLUCOSE, CAPILLARY
Glucose-Capillary: 88 mg/dL (ref 70–99)
Glucose-Capillary: 124 mg/dL — ABNORMAL HIGH (ref 70–99)
Glucose-Capillary: 153 mg/dL — ABNORMAL HIGH (ref 70–99)
Glucose-Capillary: 118 mg/dL — ABNORMAL HIGH (ref 70–99)
Glucose-Capillary: 138 mg/dL — ABNORMAL HIGH (ref 70–99)

## 2021-01-12 LAB — MAGNESIUM: Magnesium: 2.5 mg/dL — ABNORMAL HIGH (ref 1.7–2.4)

## 2021-01-12 LAB — TRIGLYCERIDES: Triglycerides: 103 mg/dL (ref <150)

## 2021-01-12 MED ORDER — DEXMEDETOMIDINE HCL IN NACL 400 MCG/100ML IV SOLN
0.4000 ug/kg/h | INTRAVENOUS | Status: DC
  Administered 2021-01-12 (×2): 1 ug/kg/h via INTRAVENOUS
  Administered 2021-01-12: 0.4 ug/kg/h via INTRAVENOUS
  Administered 2021-01-12 – 2021-01-13 (×2): 1.2 ug/kg/h via INTRAVENOUS
  Administered 2021-01-13: 0.8 ug/kg/h via INTRAVENOUS
  Filled 2021-01-12 (×8): qty 100

## 2021-01-12 MED ORDER — SODIUM CHLORIDE 0.9 % IV SOLN
250.0000 mg | Freq: Every day | INTRAVENOUS | Status: AC
  Administered 2021-01-12 – 2021-01-15 (×4): 250 mg via INTRAVENOUS
  Filled 2021-01-12 (×7): qty 20

## 2021-01-12 MED ORDER — HEPARIN BOLUS VIA INFUSION (CRRT): 1000.0000 [IU] | INTRAVENOUS | Status: DC | PRN

## 2021-01-12 MED ORDER — SODIUM CHLORIDE 0.9 % IV SOLN
500.0000 [IU]/h | INTRAVENOUS | Status: DC
  Administered 2021-01-12: 500 [IU]/h via INTRAVENOUS_CENTRAL
  Filled 2021-01-12: qty 2

## 2021-01-12 NOTE — Progress Notes (Signed)
[facility] Progress Note Patient Name: [name] DOB: [birth year] MRN: [id number]   Date of Service  01/12/2021  HPI/Events of Note  ABG on 40%/PRVC 10/TV 640/P 5 = 7.63/27/111/29. The patient is breathing about the ventilator set rate at 24.   eICU Interventions  Plan: 1. Decrease TV to 500 mL. 2. Repeat ABG at 6 AM.     Intervention Category Major Interventions: Acid-Base disturbance - evaluation and management;Respiratory failure - evaluation and management  Lysle Dingwall 01/12/2021, 3:33 AM

## 2021-01-12 NOTE — Procedures (Addendum)
Patient Name:[name] OTL:[id number] Epilepsy Attending:Justiss Gerbino Barbra Sarks Referring Physician/Provider:Dr. Zeb Comfort Duration:01/11/2021 1633 to 01/12/2021 0933  Patient history:70 year old male with seizure-like episode. EEG to evaluate forseizures.  Level of alertness:Lethargic  AEDs during EEG study:LEV  Technical aspects: This EEG study was done with scalp electrodes positioned according to the 10-20 International system of electrode placement. Electrical activity was acquired at a sampling rate of 500Hz  and reviewed with a high frequency filter of 70Hz  and a low frequency filter of 1Hz . EEG data were recorded continuously and digitally stored.   Description:EEG showed continuous generalized predominantly 5 to 7 Hz theta as well as intermittent 2-3hz  delta slowing.   Event button was pressed on 01/11/2021 at [place] and 2218 for unclear reasons.Concomitant EEG before, during and after the event didn't show any eeg change to suggest seizure. Hyperventilation and photic stimulation were not performed.   ABNORMALITY - Continuous slow, generalized  IMPRESSION: This studyis suggestive of moderate diffuse encephalopathy, nonspecific etiology.  Event button was pressed on 01/11/2021 at [place] and 2218 for unclear reasons without concomitant EEG change and were most likely NOT epileptic.    [name] Barbra Sarks

## 2021-01-12 NOTE — Progress Notes (Signed)
NAME:  [name], MRN:  [id number], DOB:  1950-11-27, LOS: 9 ADMISSION DATE:  01/03/2021, CONSULTATION DATE:  01/03/2021 REFERRING MD:  Dr. Haroldine Laws, CHIEF COMPLAINT:  STEMI/ cardiogenic shock  History of Present Illness:   70 year old male with PMHx HTN, HLD, CAD presenting via EMS with sudden onset of CP, found to have inferior STEMI with bradycardia and hypotension.  Taken emergently to Cath Lab on arrival.  Temporary pacer was placed with good capture; however, patient then developed profound hypotension decompensating into cardiac arrest, requiring intubation by anesthesia and CPR with episodes of VF requiring defibrillation and amiodarone. Patient was found to have total proximal RCA occlusion and underwent PCI to the RCA with stent placement.  Procedure was further c/b cardiogenic shock (requiring placement of Impella device) c/b bleeding secondary to cangrelor with development of bleeding and hematoma at puncture sites, Hgb dropped from 12.6 to 7.5 requiring emergent transfusion of 4U PRBCs and cangrelor discontinuation. Patient required vasopressors on initial presentation to ICU. Patient returned to ICU intubated and sedated in cardiogenic shock with TVP and Impella. PCCM consulted for vent management.   Pertinent  Medical History  HTN, HLD, CAD  Significant Hospital Events: Including procedures, antibiotic start and stop dates in addition to other pertinent events   . 4/14 Admitted Cards with inferior STEMI-> bradycardia s/p TVP, cardiogenic shock and cardiac arrest s/p impella with PCI/stent to RCA, complicated by bleeding s/p 4 units PRBC thought 2/2 cangrelor, pending coags.  Has R IJ PA cath, TVP R groin, impella L groin . 4/15 Remains on impella, weaning pressors and inotropes. Started diuresis due to significantly increased oxygen requirements. . 4/18 Concern for seizure-like activity, Neuro consult, LTM/EEG, CT Head NAICA/no evidence of edema. Transitioned to ceftriaxone from  cefepime. . 4/19 Remains on Impella, off pressors. Continuing diuresis with hopeful discontinuation of Impella, allowing for MRI 4/20-4/21. Continuing LTM EEG, given facial twitching/unclear neurologic status. . 4/20 >8L UOP/24H with Lasix gtt, Impella at P6, MRI Brain pending Impella removal .   Interim History / Subjective:  No events. EEG neg for seizures, VEEG to stop.  Objective   Blood pressure 128/61, pulse 82, temperature 99.5 F (37.5 C), temperature source Axillary, resp. rate (!) 25, height 6\' 1"  (1.854 m), weight 96.9 kg, SpO2 99 %. CVP:  [7 mmHg-14 mmHg] 7 mmHg  Vent Mode: PRVC FiO2 (%):  [40 %] 40 % Set Rate:  [10 bmp] 10 bmp Vt Set:  [500 mL-640 mL] 500 mL PEEP:  [5 cmH20] 5 cmH20 Plateau Pressure:  [16 cmH20-31 cmH20] 31 cmH20   Intake/Output Summary (Last 24 hours) at 01/12/2021 0109 Last data filed at 01/12/2021 0900 Gross per 24 hour  Intake 1720 ml  Output 3881 ml  Net -2161 ml   Filed Weights   01/09/21 0424 01/11/21 0500 01/12/21 0435  Weight: 98.8 kg 98.2 kg 96.9 kg   Physical Examination: Constitutional: ill appearing man on vent  Eyes: pupils reactive, not tracking Ears, nose, mouth, and throat: ETT in place, minimal secretions Cardiovascular: RRR, ext warm Respiratory: using accessory muscles and fighting vent Gastrointestinal: soft, +BS Skin: No rashes, normal turgor, pale, stable bruising on L shoulder Neurologic: winces to pain, I think he moved R toe slightly to command Psychiatric: cannot assess  Labs improved on CRRT Alkalotic on ABG probably related to agitation  Labs/imaging that I have personally reviewed: (right click and "Reselect all SmartList Selections" daily)  BUN/Cr up Alkalotic on ABG regardless of vent mode used Plts imporved Hgb drifting down  coox 63% improved  Resolved Hospital Problem list   Hemorrhagic shock Lactic acidosis  Assessment & Plan:  Inferior STEMI post PCI and stenting RCA  Cardiogenic shock  improving  Volume overloaded state of heart improved  Hypervolemic AKI now on CRRT Anoxic brain injury- having some improvement on exam so giving more time Acute hypoxemic respiratory failure with encephalopathy being barrier to extubation  Question HCAP- check tracheal aspirate Twitching- no EEG correlate so thought less likely seizures  - Watch H/H on DAPT, okay for some iron today - Continue CRRT with neg pull, appreciate nephrology assistance - GDMT per CHF team, appreciate help - AEDs and EEG per neurology, appreciate help - Add precedex with PRN fentanyl - Once fluid is off, can consider SAT/SBT depending on mental status  Best practice (right click and "Reselect all SmartList Selections" daily)  Diet:  Tube Feed  Pain/Anxiety/Delirium protocol (if indicated): Yes (RASS goal 0) to -1 VAP protocol (if indicated): Yes DVT prophylaxis: Systemic AC GI prophylaxis: PPI Glucose control:  SSI Yes and Basal insulin Yes Central venous access:  Yes, and it is still needed Arterial line:  Yes, and it is still needed Foley:  Yes, and it is still needed Mobility:  bed rest  PT consulted: N/A Last date of multidisciplinary goals of care discussion daily with POA Code Status:  full code Disposition: ICU    Patient critically ill due to respiratory failure Interventions to address this today vent titration Risk of deterioration without these interventions is high  I personally spent 33 minutes providing critical care not including any separately billable procedures  Erskine Emery MD [facility]  Prefer epic messenger for cross cover needs If after hours, please call E-link

## 2021-01-12 NOTE — Procedures (Signed)
Arterial Catheter Insertion Procedure Note  [name]  [id number]  Feb 21, 1951  Date:01/12/21  Time:12:14 PM    Provider Performing: Candee Furbish    Procedure: Insertion of Arterial Line [phone]) with US guidance ([id number])   Indication(s) Blood pressure monitoring and/or need for frequent ABGs  Consent Risks of the procedure as well as the alternatives and risks of each were explained to the patient and/or caregiver.  Consent for the procedure was obtained and is signed in the bedside chart  Anesthesia None   Time Out Verified patient identification, verified procedure, site/side was marked, verified correct patient position, special equipment/implants available, medications/allergies/relevant history reviewed, required imaging and test results available.   Sterile Technique Maximal sterile technique including full sterile barrier drape, hand hygiene, sterile gown, sterile gloves, mask, hair covering, sterile ultrasound probe cover (if used).   Procedure Description Area of catheter insertion was cleaned with chlorhexidine and draped in sterile fashion. With real-time ultrasound guidance an arterial catheter was placed into the right axillary artery.  Appropriate arterial tracings confirmed on monitor.     Complications/Tolerance None; patient tolerated the procedure well.   EBL Minimal   Specimen(s) None

## 2021-01-12 NOTE — Progress Notes (Addendum)
[facility] ROUNDING NOTE   Subjective:   Interval History: [name] is a 70 y.o. male.  He was admitted 01/03/2021.  Has a history of hypertension hyperlipidemia coronary artery disease presented with chest pain found to have NSTEMI.  He underwent left heart catheterization with placement of TVP for bradycardia.  He underwent placement of Impella device and stenting of the RCA.  He had a VF arrest during the procedure prompting intubation.  Once Impella began functioning he achieved ROSC.   He has an ejection fraction of 20-25% on 2D echo.  He is undergoing neurological evaluation appreciate the assistance of Dr. Hortense Ramal for diffuse encephalopathy on EEG and an MRI that revealed micro emboli.  His course is also been complicated by hemorrhagic shock and bleeding from the left groin Impella transfused 4 units packed red blood cells.  Impella device was removed 01/09/2021.  He has baseline serum creatinine which is within normal range.  He has developed acute nonoliguric renal failure.  His home medications do include Cozaar 100 mg daily  Blood pressure 138/71 pulse 85 temperature 98.8.  Urine output 1.475 L.  Sodium 143 potassium 4 chloride 104 CO2 29 BUN 73 creatinine 2.99 calcium 8.1 phosphorus 2.9 magnesium 2.5.  Urinalysis bland.  Ultrasound showed a right simple renal cyst negative for obstructive uropathy  Patient was initiated on CRRT 01/11/2021 for volume control.   Objective:  Vital signs in last 24 hours:  Temp:  [98.3 F (36.8 C)-99 F (37.2 C)] 98.8 F (37.1 C) (04/23 0338) Pulse Rate:  [37-87] 74 (04/23 0600) Resp:  [16-30] 26 (04/23 0700) BP: (117-144)/(46-110) 127/69 (04/23 0700) SpO2:  [83 %-100 %] 100 % (04/23 0600) Arterial Line BP: (89-163)/(52-87) 126/52 (04/22 1700) FiO2 (%):  [40 %] 40 % (04/23 0315) Weight:  [96.9 kg] 96.9 kg (04/23 0435)  Weight change: -1.3 kg Filed Weights   01/09/21 0424 01/11/21 0500 01/12/21 0435  Weight: 98.8 kg 98.2 kg  96.9 kg    Intake/Output: I/O last 3 completed shifts: In: 2754.9 [NG/GT:2175; IV Piggyback:579.9] Out: 5009 [Urine:2320; Other:2344]   Intake/Output this shift:  No intake/output data recorded. General: Ill-appearing gentleman intubated HEENT: Moist mucous membranes ET tube Eyes: Pupils reactive Neck: JVP elevated Heart: Regular rate and rhythm no murmurs rubs gallops Lungs: Mechanically supported breath sounds Abdomen: Soft nontender hypoactive bowel sounds Extremities: 2-3+ pitting edema lower extremities Skin: No cyanosis clubbing rash      Basic Metabolic Panel: Recent Labs  Lab 01/06/21 2012 01/07/21 0334 01/07/21 0613 01/08/21 0032 01/08/21 0035 01/08/21 0341 01/08/21 1237 01/09/21 0700 01/09/21 2016 01/10/21 0429 01/10/21 0609 01/10/21 1645 01/11/21 0405 01/11/21 0411 01/11/21 1532 01/12/21 0317 01/12/21 0510 01/12/21 0648  NA 136 138   < > 138   < > 139   < > 142   < > 144   < > 144 147* 147* 146* 138 141 143  K 3.6 3.6   < > 3.5   < > 3.6   < > 3.2*   < > 3.7   < > 3.4* 3.4* 3.4* 3.5 3.4* 3.7 4.0  CL 106 104   < > 100  --  101   < > 100   < > 101  --  101 104  --  106  --  104  --   CO2 24 24   < > 24  --  26   < > 30   < > 29  --  27 29  --  29  --  29  --   GLUCOSE 90 78   < > 123*  --  135*   < > 168*   < > 174*  --  177* 179*  --  151*  --  147*  --   BUN 40* 43*   < > 51*  --  52*   < > 66*   < > 89*  --  102* 114*  --  101*  --  73*  --   CREATININE 2.88* 3.14*   < > 3.69*  --  3.75*   < > 4.08*   < > 4.71*  --  4.95* 5.03*  --  4.20*  --  2.99*  --   CALCIUM 7.4* 7.6*   < > 8.0*  --  8.0*   < > 7.8*   < > 7.8*  --  7.9* 8.0*  --  8.0*  --  8.1*  --   MG 2.0 2.0  --  2.0  --  2.0  --  1.9  --  2.5*  --   --   --   --   --   --  2.5*  --   PHOS 6.0* 6.1*  --   --   --  7.0*  --   --   --   --   --   --   --   --  4.6  --  2.9  --    < > = values in this interval not displayed.    Liver Function Tests: Recent Labs  Lab 01/05/21 2028  01/08/21 0341 01/10/21 0429 01/11/21 1532 01/12/21 0510  AST 38 34 39  --   --   ALT 35 28 32  --   --   ALKPHOS 34* 104 154*  --   --   BILITOT 0.6 1.0 0.9  --   --   PROT 4.4* 5.5* 5.1*  --   --   ALBUMIN 2.1* 1.9* 1.7* 2.8* 2.6*   No results for input(s): LIPASE, AMYLASE in the last 168 hours. No results for input(s): AMMONIA in the last 168 hours.  CBC: Recent Labs  Lab 01/09/21 0836 01/10/21 0429 01/10/21 0609 01/11/21 0405 01/11/21 0411 01/11/21 1216 01/12/21 0317 01/12/21 0510 01/12/21 0648  WBC 10.9* 7.9  --  6.5  --  7.1  --  10.3  --   HGB 10.4* 9.2*   < > 7.8* 7.5* 7.6* 7.8* 8.3* 8.8*  HCT 31.5* 27.8*   < > 24.5* 22.0* 24.0* 23.0* 26.5* 26.0*  MCV 90.5 91.1  --  93.5  --  93.0  --  95.0  --   PLT 68* 91*  --  101*  --  114*  --  155  --    < > = values in this interval not displayed.    Cardiac Enzymes: Recent Labs  Lab 01/11/21 1216  CKTOTAL 316    BNP: Invalid input(s): POCBNP  CBG: Recent Labs  Lab 01/11/21 1128 01/11/21 1547 01/11/21 1939 01/11/21 2342 01/12/21 0321  GLUCAP 157* 98 91 101* 88    Microbiology: Results for orders placed or performed during the hospital encounter of 01/03/21  Resp Panel by RT-PCR (Flu A&B, Covid) Nasopharyngeal Swab     Status: None   Collection Time: 01/03/21  7:54 AM   Specimen: Nasopharyngeal Swab; Nasopharyngeal(NP) swabs in vial transport medium  Result Value Ref Range Status   SARS Coronavirus 2 by RT PCR NEGATIVE NEGATIVE  Final    Comment: (NOTE) SARS-CoV-2 target nucleic acids are NOT DETECTED.  The SARS-CoV-2 RNA is generally detectable in upper respiratory specimens during the acute phase of infection. The lowest concentration of SARS-CoV-2 viral copies this assay can detect is 138 copies/mL. A negative result does not preclude SARS-Cov-2 infection and should not be used as the sole basis for treatment or other patient management decisions. A negative result may occur with  improper  specimen collection/handling, submission of specimen other than nasopharyngeal swab, presence of viral mutation(s) within the areas targeted by this assay, and inadequate number of viral copies(<138 copies/mL). A negative result must be combined with clinical observations, patient history, and epidemiological information. The expected result is Negative.  Fact Sheet for Patients:  EntrepreneurPulse.com.au  Fact Sheet for Healthcare Providers:  IncredibleEmployment.be  This test is no t yet approved or cleared by the [place] FDA and  has been authorized for detection and/or diagnosis of SARS-CoV-2 by FDA under an Emergency Use Authorization (EUA). This EUA will remain  in effect (meaning this test can be used) for the duration of the COVID-19 declaration under Section 564(b)(1) of the Act, 21 U.S.C.section 360bbb-3(b)(1), unless the authorization is terminated  or revoked sooner.       Influenza A by PCR NEGATIVE NEGATIVE Final   Influenza B by PCR NEGATIVE NEGATIVE Final    Comment: (NOTE) The Xpert Xpress SARS-CoV-2/FLU/RSV plus assay is intended as an aid in the diagnosis of influenza from Nasopharyngeal swab specimens and should not be used as a sole basis for treatment. Nasal washings and aspirates are unacceptable for Xpert Xpress SARS-CoV-2/FLU/RSV testing.  Fact Sheet for Patients: EntrepreneurPulse.com.au  Fact Sheet for Healthcare Providers: IncredibleEmployment.be  This test is not yet approved or cleared by the [place] FDA and has been authorized for detection and/or diagnosis of SARS-CoV-2 by FDA under an Emergency Use Authorization (EUA). This EUA will remain in effect (meaning this test can be used) for the duration of the COVID-19 declaration under Section 564(b)(1) of the Act, 21 U.S.C. section 360bbb-3(b)(1), unless the authorization is terminated or revoked.  Performed at  [facility], [place] [address], [place], [place] [address]   MRSA PCR Screening     Status: None   Collection Time: 01/03/21  3:30 PM   Specimen: Nasopharyngeal  Result Value Ref Range Status   MRSA by PCR NEGATIVE NEGATIVE Final    Comment:        The GeneXpert MRSA Assay (FDA approved for NASAL specimens only), is one component of a comprehensive MRSA colonization surveillance program. It is not intended to diagnose MRSA infection nor to guide or monitor treatment for MRSA infections. Performed at [facility], [place] [address] [place], [place] [address]   Culture, Respiratory w Gram Stain     Status: None (Preliminary result)   Collection Time: 01/11/21 11:24 AM   Specimen: Tracheal Aspirate; Respiratory  Result Value Ref Range Status   Specimen Description TRACHEAL ASPIRATE  Final   Special Requests NONE  Final   Gram Stain   Final    ABUNDANT WBC PRESENT,BOTH PMN AND MONONUCLEAR ABUNDANT GRAM NEGATIVE RODS FEW GRAM VARIABLE ROD FEW YEAST Performed at [facility], [place] [address] [place], [place] [address]    Culture PENDING  Incomplete   Report Status PENDING  Incomplete    Coagulation Studies: No results for input(s): LABPROT, INR in the last 72 hours.  Urinalysis: No results for input(s): COLORURINE, LABSPEC, [place],  GLUCOSEU, HGBUR, BILIRUBINUR, KETONESUR, PROTEINUR, UROBILINOGEN, NITRITE, LEUKOCYTESUR in the last 72 hours.  Invalid input(s): APPERANCEUR    Imaging: US RENAL  Result Date: 01/11/2021 CLINICAL DATA:  Acute kidney injury EXAM: RENAL / URINARY TRACT ULTRASOUND COMPLETE COMPARISON:  None. FINDINGS: Right Kidney: Renal measurements: 12.6 x 5.3 x 6.5 cm = volume: 226 mL. Echogenicity within normal limits. Two simple cysts at the lower pole of the right kidney measuring up to 3.3 cm and 2.9 cm, respectively. No shadowing stone or hydronephrosis visualized. Left Kidney: Renal measurements: 12.9 x 6.4 x 6.2 cm = volume: 265 mL.  Echogenicity within normal limits. No mass, shadowing stone, or hydronephrosis visualized. Bladder: Decompressed by Foley catheter. Other: None. IMPRESSION: 1. Negative for obstructive uropathy. 2. Simple right renal cysts measuring up to 3.3 cm. Electronically Signed   By: Davina Poke D.O.   On: 01/11/2021 15:07   DG CHEST PORT 1 VIEW  Result Date: 01/11/2021 CLINICAL DATA:  Central line placement EXAM: PORTABLE CHEST 1 VIEW COMPARISON:  01/08/2021 FINDINGS: Endotracheal tube in good position. Swan-Ganz catheter removed. Right jugular sheath remains in place. NG tube in the stomach. Impella device removed in the interval. Interval placement of dual lumen central venous catheter from left jugular approach. Tip in the lower SVC at the cavoatrial junction. No pneumothorax. Progression of bibasilar airspace disease. No significant effusion. Upper lobes clear. IMPRESSION: Left jugular central venous catheter tip at the cavoatrial junction. No pneumothorax Progression of bibasilar atelectasis/infiltrate. Electronically Signed   By: Franchot Gallo M.D.   On: 01/11/2021 11:34     Medications:   .  prismasol BGK 4/2.5 500 mL/hr at 01/11/21 2254  .  prismasol BGK 4/2.5 500 mL/hr at 01/11/21 2302  . sodium chloride 250 mL (01/11/21 1007)  . dextrose    . feeding supplement (VITAL 1.5 CAL) 55 mL/hr at 01/12/21 0600  . levETIRAcetam 750 mg (01/11/21 2310)  . prismasol BGK 4/2.5 2,000 mL/hr at 01/12/21 0515  . propofol (DIPRIVAN) infusion Stopped (01/10/21 0731)   . aspirin  81 mg Per Tube Daily  . chlorhexidine gluconate (MEDLINE KIT)  15 mL Mouth Rinse BID  . Chlorhexidine Gluconate Cloth  6 each Topical Daily  . feeding supplement (PROSource TF)  45 mL Per Tube TID  . insulin aspart  2-6 Units Subcutaneous Q4H  . mouth rinse  15 mL Mouth Rinse 10 times per day  . metoCLOPramide (REGLAN) injection  5 mg Intravenous Q8H  . pantoprazole sodium  40 mg Per Tube Daily  . polyethylene glycol  17 g Per  Tube BID  . rosuvastatin  10 mg Per Tube Daily  . sodium chloride flush  10-40 mL Intracatheter Q12H  . sodium chloride flush  3 mL Intravenous Q12H  . ticagrelor  90 mg Per Tube BID   sodium chloride, sodium chloride, acetaminophen (TYLENOL) oral liquid 160 mg/5 mL, dextrose, fentaNYL (SUBLIMAZE) injection, heparin, LORazepam, ondansetron (ZOFRAN) IV, sodium chloride, sodium chloride flush, sodium chloride flush  Assessment/ Plan:  1.Acute kidney injury.  Baseline serum creatinine 0.9 mg/dL it appears that the acute kidney injury is related to his cardiogenic/hemorrhagic shock.  He is now no longer requiring pressors.  He is nonoliguric and has responded to IV Lasix.  He is still edematous.  We have been asked to consider CRRT.  I believe this is a reasonable approach in order to help augment his volume status.  His urinalysis is bland.    Renal ultrasound was unremarkable.  Avoid nephrotoxins including IV  contrast.  He did receive a left-sided heart catheterization 01/03/2021.    CRRT initiated 01/11/2021.  No changes to prescription 4/2.5 bags of dialysate and replacement fluid 2. Hypertension/volume  -still markedly volume overloaded despite a good diuretic response with IV Lasix.  Dr Jeffie Pollock has requested that CRRT is performed. 3.  Anemia  .  Some iron deficiency noted on studies not a candidate for ESA until replete.  Would recommend transfusion per CCM recommendation 4.  Electrolytes: Potassium slightly decreased.  We will continue to follow on CRRT. 5.  Encephalopathy: Microemboli on MRI.  No evidence of seizure activities continues on Keppra. 6.  Congestive heart failure with diastolic dysfunction ejection fraction 20%.  Status post stent and Impella.  Followed by cardiology 7.  Ventilator dependent respiratory failure followed by critical care medicine   LOS: [place] _0 _1 :08 AM

## 2021-01-12 NOTE — Progress Notes (Addendum)
LTM maint complete - serviced [facility] monitored push button tested and confirmed.

## 2021-01-12 NOTE — Progress Notes (Signed)
Patient ID: [name], male   DOB: 24-Jul-1951, 70 y.o.   MRN: [id number]     Advanced Heart Failure Rounding Note  PCP-Cardiologist: No primary care provider on file.   Subjective:    4/20 Impella removed. MRI brain with numerous micro embolic infarctions. Off pressors.  4/21 lasix drip stopped.  4/22 started CVVH  CO-OX 64%, no pressors/inotropes.  UOP 1435 and also getting CVVH, pulling 100-150 cc/hr net UF.   Weight down 3 lbs.  CVP 8.   Remains intubated FiO2 40%.  Off sedation.  Withdraws from painful stimuli but does not follow commands or awaken.    Objective:   Weight Range: 96.9 kg Body mass index is 28.18 kg/m.   Vital Signs:   Temp:  [98.3 F (36.8 C)-99 F (37.2 C)] 98.8 F (37.1 C) (04/23 0338) Pulse Rate:  [37-87] 74 (04/23 0600) Resp:  [16-30] 26 (04/23 0700) BP: (117-144)/(46-110) 127/69 (04/23 0700) SpO2:  [83 %-100 %] 100 % (04/23 0600) Arterial Line BP: (89-163)/(52-87) 126/52 (04/22 1700) FiO2 (%):  [40 %] 40 % (04/23 0315) Weight:  [96.9 kg] 96.9 kg (04/23 0435) Last BM Date: 01/11/21  Weight change: Filed Weights   01/09/21 0424 01/11/21 0500 01/12/21 0435  Weight: 98.8 kg 98.2 kg 96.9 kg    Intake/Output:   Intake/Output Summary (Last 24 hours) at 01/12/2021 0817 Last data filed at 01/12/2021 0700 Gross per 24 hour  Intake 1610 ml  Output 3639 ml  Net -2029 ml      Physical Exam   General: Intubated Neck: No JVD, no thyromegaly or thyroid nodule.  Lungs: Clear to auscultation bilaterally with normal respiratory effort. CV: Nondisplaced PMI.  Heart regular S1/S2, no S3/S4, no murmur.  No peripheral edema.   Abdomen: Soft, nontender, no hepatosplenomegaly, no distention.  Skin: Intact without lesions or rashes.  Neurologic: Withdraws from painful stimuli Extremities: No clubbing or cyanosis.  HEENT: Normal.    Telemetry   SR 70-90s (personally reviewed)  Labs    CBC Recent Labs    01/11/21 1216 01/12/21 0317  01/12/21 0510 01/12/21 0648  WBC 7.1  --  10.3  --   HGB 7.6*   < > 8.3* 8.8*  HCT 24.0*   < > 26.5* 26.0*  MCV 93.0  --  95.0  --   PLT 114*  --  155  --    < > = values in this interval not displayed.   Basic Metabolic Panel Recent Labs    01/10/21 0429 01/10/21 0609 01/11/21 1532 01/12/21 0317 01/12/21 0510 01/12/21 0648  NA 144   < > 146*   < > 141 143  K 3.7   < > 3.5   < > 3.7 4.0  CL 101   < > 106  --  104  --   CO2 29   < > 29  --  29  --   GLUCOSE 174*   < > 151*  --  147*  --   BUN 89*   < > 101*  --  73*  --   CREATININE 4.71*   < > 4.20*  --  2.99*  --   CALCIUM 7.8*   < > 8.0*  --  8.1*  --   MG 2.5*  --   --   --  2.5*  --   PHOS  --   --  4.6  --  2.9  --    < > = values in this interval not  displayed.   Liver Function Tests Recent Labs    01/10/21 0429 01/11/21 1532 01/12/21 0510  AST 39  --   --   ALT 32  --   --   ALKPHOS 154*  --   --   BILITOT 0.9  --   --   PROT 5.1*  --   --   ALBUMIN 1.7* 2.8* 2.6*   No results for input(s): LIPASE, AMYLASE in the last 72 hours. Cardiac Enzymes Recent Labs    01/11/21 1216  CKTOTAL 316    BNP: BNP (last 3 results) No results for input(s): BNP in the last 8760 hours.  ProBNP (last 3 results) No results for input(s): PROBNP in the last 8760 hours.   D-Dimer No results for input(s): DDIMER in the last 72 hours. Hemoglobin A1C No results for input(s): HGBA1C in the last 72 hours. Fasting Lipid Panel Recent Labs    01/12/21 0510  TRIG 103   Thyroid Function Tests No results for input(s): TSH, T4TOTAL, T3FREE, THYROIDAB in the last 72 hours.  Invalid input(s): FREET3  Other results:   Imaging    US RENAL  Result Date: 01/11/2021 CLINICAL DATA:  Acute kidney injury EXAM: RENAL / URINARY TRACT ULTRASOUND COMPLETE COMPARISON:  None. FINDINGS: Right Kidney: Renal measurements: 12.6 x 5.3 x 6.5 cm = volume: 226 mL. Echogenicity within normal limits. Two simple cysts at the lower pole of  the right kidney measuring up to 3.3 cm and 2.9 cm, respectively. No shadowing stone or hydronephrosis visualized. Left Kidney: Renal measurements: 12.9 x 6.4 x 6.2 cm = volume: 265 mL. Echogenicity within normal limits. No mass, shadowing stone, or hydronephrosis visualized. Bladder: Decompressed by Foley catheter. Other: None. IMPRESSION: 1. Negative for obstructive uropathy. 2. Simple right renal cysts measuring up to 3.3 cm. Electronically Signed   By: Davina Poke D.O.   On: 01/11/2021 15:07   DG CHEST PORT 1 VIEW  Result Date: 01/11/2021 CLINICAL DATA:  Central line placement EXAM: PORTABLE CHEST 1 VIEW COMPARISON:  01/08/2021 FINDINGS: Endotracheal tube in good position. Swan-Ganz catheter removed. Right jugular sheath remains in place. NG tube in the stomach. Impella device removed in the interval. Interval placement of dual lumen central venous catheter from left jugular approach. Tip in the lower SVC at the cavoatrial junction. No pneumothorax. Progression of bibasilar airspace disease. No significant effusion. Upper lobes clear. IMPRESSION: Left jugular central venous catheter tip at the cavoatrial junction. No pneumothorax Progression of bibasilar atelectasis/infiltrate. Electronically Signed   By: Franchot Gallo M.D.   On: 01/11/2021 11:34   Overnight EEG with video  Result Date: 01/12/2021 Lora Havens, MD     01/12/2021  8:15 AM Patient Name:[name] ZOX:[id number] Epilepsy Attending:Priyanka Barbra Sarks Referring Physician/Provider:Dr. Zeb Comfort Duration:01/11/2021 8119 to 01/12/2021 0800  Patient history:70 year old male with seizure-like episode. EEG to evaluate forseizures.  Level of alertness:Lethargic  AEDs during EEG study:LEV  Technical aspects: This EEG study was done with scalp electrodes positioned according to the 10-20 International system of electrode placement. Electrical activity was acquired at a sampling rate of _0  and reviewed with a high  frequency filter of _1  and a low frequency filter of _2 . EEG data were recorded continuously and digitally stored.  Description:EEG showed continuous generalized predominantly 5 to 7 Hz theta as well as intermittent 2-_3  delta slowing. Event button was pressed on 01/11/2021 at [place] and 2218 for unclear reasons.Concomitant EEG before, during and after the event didn't show any eeg change to suggest  seizure. Hyperventilation and photic stimulation were not performed.   ABNORMALITY - Continuous slow, generalized  IMPRESSION: This studyis suggestive of moderate diffuse encephalopathy, nonspecific etiology.  Event button was pressed on 01/11/2021 at [place] and 2218 for unclear reasons without concomitant EEG change and were most likely NOT epileptic.   Priyanka Barbra Sarks    Medications:     Scheduled Medications: . aspirin  81 mg Per Tube Daily  . chlorhexidine gluconate (MEDLINE KIT)  15 mL Mouth Rinse BID  . Chlorhexidine Gluconate Cloth  6 each Topical Daily  . feeding supplement (PROSource TF)  45 mL Per Tube TID  . insulin aspart  2-6 Units Subcutaneous Q4H  . mouth rinse  15 mL Mouth Rinse 10 times per day  . metoCLOPramide (REGLAN) injection  5 mg Intravenous Q8H  . pantoprazole sodium  40 mg Per Tube Daily  . polyethylene glycol  17 g Per Tube BID  . rosuvastatin  10 mg Per Tube Daily  . sodium chloride flush  10-40 mL Intracatheter Q12H  . sodium chloride flush  3 mL Intravenous Q12H  . ticagrelor  90 mg Per Tube BID    Infusions: .  prismasol BGK 4/2.5 500 mL/hr at 01/11/21 2254  .  prismasol BGK 4/2.5 500 mL/hr at 01/11/21 2302  . sodium chloride 250 mL (01/11/21 1007)  . dextrose    . feeding supplement (VITAL 1.5 CAL) 55 mL/hr at 01/12/21 0600  . levETIRAcetam 750 mg (01/11/21 2310)  . prismasol BGK 4/2.5 2,000 mL/hr at 01/12/21 0515  . propofol (DIPRIVAN) infusion Stopped (01/10/21 0731)    PRN Medications: sodium chloride, sodium chloride, acetaminophen  (TYLENOL) oral liquid 160 mg/5 mL, dextrose, fentaNYL (SUBLIMAZE) injection, heparin, LORazepam, ondansetron (ZOFRAN) IV, sodium chloride, sodium chloride flush, sodium chloride flush    Assessment/Plan   1. CAD/ Acute Inferior STEMI - emergent cath w/ pRCA occlusion s/p PCI + DES - Initial angiography showed sluggish flow in LAD and LCX>>improved post impella placement  - Echo LVEF 20-25%, RV moderately reduced - DAPT w/ ASA + Brilinta . Hgb higher today at 8.3. No obvious bleeding.  - Crestor 10 per tube   2. VF Arrest - in setting of acute MI, now s/p revascularization  - no further ectopy on tele. Amio stopped - keep K > 4.0 and Mg > 2.0  - EEG with diffuse encephalopathy but no seizure activity. - MRI brain micro-emboli.    3. Shock, Combination Cardiogenic + Hemorrhagic  - Acute MI + developed severe hemorrhagic shock due to bleeding at left groin Impella Transfused 4U PRBCs . - Impella out 4/20  - Hgb stable. - Co-ox 64%, now off pressors/inotropes.   4. Acute Systolic Heart Failure>>Cardiogenic Shock  - ICM Echo LVEF 20-25%, RV moderately reduced - Impella out 4/20. Off all pressors.  - Volume status improved. CVP 8 today.  Now on CVVH, would decrease net UF back to 50 cc/hr.  - No bb with shock.  - No spiro/arni/dig with AKI.   5. Acute hypoxic respiratory failure - now on 40% FIO2 - vent management per PCCM   5. Nonoliguric AKI - due to ATN/shock - Creatinine up to 5 with concern for uremia contributing to neurologic dysfunction, so CVVH begun 4/22.   - UOP 1435 yesterday and CVP 8, will decrease net UF to 50 cc/hr.   6. Hypokalemia/hyperkalemia - CVVH  7. Leukocytosis - Resolved.  8. DM2 - off insulin gtt - now on levimir and SSI  9. Encephalopathy -->  uremia, acute embolic strokes.  - post cardiac arrest - head CT negative - EEG w/ generalized periodic discharges with triphasic morphology + moderate to severe diffuse encephalopathy.  - ? cefepime  toxicity +/- anoxic /hypoxic brain injury. Now off cefepime - neurology following   - MRI- numerous micro embolic infarctions . No hemorrhage or mass.  - On keppra - CVVH begun 4/22 for ?uremia contribution to encephalopathy  Length of Stay: 9  CRITICAL CARE Performed by: Loralie Champagne  Total critical care time: 35 minutes  Critical care time was exclusive of separately billable procedures and treating other patients.  Critical care was necessary to treat or prevent imminent or life-threatening deterioration.  Critical care was time spent personally by me (independent of midlevel providers or residents) on the following activities: development of treatment plan with patient and/or surrogate as well as nursing, discussions with consultants, evaluation of patient's response to treatment, examination of patient, obtaining history from patient or surrogate, ordering and performing treatments and interventions, ordering and review of laboratory studies, ordering and review of radiographic studies, pulse oximetry and re-evaluation of patient's condition.  Loralie Champagne, MD  8:17 AM

## 2021-01-12 NOTE — Progress Notes (Signed)
B and E Progress Note Patient Name: [name] DOB: 1950/10/02 MRN: [id number]   Date of Service  01/12/2021  HPI/Events of Note  Bedside RN requesting clarification of sedation goals on Precedex.  eICU Interventions  Sedation goals clarified ; Goal is absence of ventilator dyssynchrony (Target RASS is 0 to -1).        Frederik Pear 01/12/2021, 8:57 PM

## 2021-01-12 NOTE — Progress Notes (Signed)
Subjective: On CRRT today Nursing reports some spontaneous movement particularly of the left lower extremity Nursing reports pushbutton events for 2 episodes of twitching overnight for which Ativan was also given   ROS: Unable to obtain due to poor mental status  Examination  Vital signs in last 24 hours: Temp:  [98.3 F (36.8 C)-99 F (37.2 C)] 98.8 F (37.1 C) (04/23 0338) Pulse Rate:  [37-91] 91 (04/23 0830) Resp:  [16-30] 29 (04/23 0830) BP: (117-144)/(46-110) 143/67 (04/23 0830) SpO2:  [83 %-100 %] 100 % (04/23 0830) Arterial Line BP: (89-163)/(52-87) 126/52 (04/22 1700) FiO2 (%):  [40 %] 40 % (04/23 0315) Weight:  [96.9 kg] 96.9 kg (04/23 0435)  General: lying in bed,not in apparent distress CVS: pulse-normal rate and rhythm DQ:[id number], coarse breath sounds bilaterally Extremities: normal,warm, improving edema Neuro:Does not open eyes even to noxious stimulation, does not follow commands, PERRLA, corneal reflex intact, gag reflex intact, withdraws to noxious stimuli in bilateral upper extremities and bilateral lower extremity,  Basic Metabolic Panel: Recent Labs  Lab 01/06/21 2012 01/07/21 0334 01/07/21 1194 01/08/21 0032 01/08/21 0035 01/08/21 0341 01/08/21 1237 01/09/21 0700 01/09/21 2016 01/10/21 0429 01/10/21 0609 01/10/21 1645 01/11/21 0405 01/11/21 0411 01/11/21 1532 01/12/21 0317 01/12/21 0510 01/12/21 0648  NA 136 138   < > 138   < > 139   < > 142   < > 144   < > 144 147* 147* 146* 138 141 143  K 3.6 3.6   < > 3.5   < > 3.6   < > 3.2*   < > 3.7   < > 3.4* 3.4* 3.4* 3.5 3.4* 3.7 4.0  CL 106 104   < > 100  --  101   < > 100   < > 101  --  101 104  --  106  --  104  --   CO2 24 24   < > 24  --  26   < > 30   < > 29  --  27 29  --  29  --  29  --   GLUCOSE 90 78   < > 123*  --  135*   < > 168*   < > 174*  --  177* 179*  --  151*  --  147*  --   BUN 40* 43*   < > 51*  --  52*   < > 66*   < > 89*  --  102* 114*  --  101*  --  73*  --   CREATININE  2.88* 3.14*   < > 3.69*  --  3.75*   < > 4.08*   < > 4.71*  --  4.95* 5.03*  --  4.20*  --  2.99*  --   CALCIUM 7.4* 7.6*   < > 8.0*  --  8.0*   < > 7.8*   < > 7.8*  --  7.9* 8.0*  --  8.0*  --  8.1*  --   MG 2.0 2.0  --  2.0  --  2.0  --  1.9  --  2.5*  --   --   --   --   --   --  2.5*  --   PHOS 6.0* 6.1*  --   --   --  7.0*  --   --   --   --   --   --   --   --  4.6  --  2.9  --    < > = values in this interval not displayed.    CBC: Recent Labs  Lab 01/09/21 0836 01/10/21 0429 01/10/21 0609 01/11/21 0405 01/11/21 0411 01/11/21 1216 01/12/21 0317 01/12/21 0510 01/12/21 0648  WBC 10.9* 7.9  --  6.5  --  7.1  --  10.3  --   HGB 10.4* 9.2*   < > 7.8* 7.5* 7.6* 7.8* 8.3* 8.8*  HCT 31.5* 27.8*   < > 24.5* 22.0* 24.0* 23.0* 26.5* 26.0*  MCV 90.5 91.1  --  93.5  --  93.0  --  95.0  --   PLT 68* 91*  --  101*  --  114*  --  155  --    < > = values in this interval not displayed.     Coagulation Studies: No results for input(s): LABPROT, INR in the last 72 hours.  Imaging No new brain imaging overnight  ASSESSMENT AND PLAN:  ASSESSMENT AND PLAN: 70 year old male presented with chest painwho initially presented on 4/14/2022with chest pain and was found to have STEMI, s/p LHC andplacement of TVP for bradycardia, placement of impella, and stenting of RCA.Postop course was complicated by V. fib arrest and hemorrhagic shock at the site of Impella insertion. Today patient will need to have twitching of mouth.  Seizure-like episode Acute embolic strokes Acute encephalopathy AKI -Encephalopathy likely secondary to uremia, sedation. -Multiple punctate ischemic strokes likely cardioembolic due to left heart cath. -There appears to be an area of restricted diffusion in the left precentral gyrus/motor sulcus which is likely why patient has right upper extremity weakness  Recommendations: -ContinueKeppra 500 mg twice daily. - Patient continues to show improvement in  neurologic exam, was able to move all extremities today.  However still not following commands.  We will wait till Monday and continue to monitor for neurological improvement -Discontinue LTM -Continue seizure precautions -As needed IV Ativan 2 mg for clinical seizure-like activity lasting more than 5 minutes, but please do not administer for the events that occurred overnight.  Have requested nursing to include this description in a note for reference.  -Please call neuro if neurological concerns arise before Monday  Lesleigh Noe MD-PhD [facility] [phone] Available 7 AM to 7 PM, outside these hours please contact Neurologist on call listed on [place] Performed by: Lorenza Chick   Total critical care time: 32 minutes  Critical care time was exclusive of separately billable procedures and treating other patients.  Critical care was necessary to treat or prevent imminent or life-threatening deterioration.  Critical care was time spent personally by me on the following activities: development of treatment plan with patient and/or surrogate as well as nursing, discussions with consultants, evaluation of patient's response to treatment, examination of patient, obtaining history from patient or surrogate, ordering and performing treatments and interventions, ordering and review of laboratory studies, ordering and review of radiographic studies, pulse oximetry and re-evaluation of patient's condition.

## 2021-01-12 NOTE — Progress Notes (Signed)
vLTM EEG complete. No skin breakdown 

## 2021-01-13 ENCOUNTER — Inpatient Hospital Stay (HOSPITAL_COMMUNITY): Payer: Medicare PPO

## 2021-01-13 DIAGNOSIS — R570 Cardiogenic shock: Secondary | ICD-10-CM | POA: Diagnosis not present

## 2021-01-13 DIAGNOSIS — Z978 Presence of other specified devices: Secondary | ICD-10-CM | POA: Diagnosis not present

## 2021-01-13 DIAGNOSIS — N171 Acute kidney failure with acute cortical necrosis: Secondary | ICD-10-CM | POA: Diagnosis not present

## 2021-01-13 LAB — BASIC METABOLIC PANEL
Anion gap: 6 (ref 5–15)
Anion gap: 11 (ref 5–15)
BUN: 48 mg/dL — ABNORMAL HIGH (ref 8–23)
BUN: 60 mg/dL — ABNORMAL HIGH (ref 8–23)
CO2: 28 mmol/L (ref 22–32)
CO2: 23 mmol/L (ref 22–32)
Calcium: 8.2 mg/dL — ABNORMAL LOW (ref 8.9–10.3)
Calcium: 8.3 mg/dL — ABNORMAL LOW (ref 8.9–10.3)
Chloride: 105 mmol/L (ref 98–111)
Chloride: 104 mmol/L (ref 98–111)
Creatinine, Ser: 1.72 mg/dL — ABNORMAL HIGH (ref 0.61–1.24)
Creatinine, Ser: 2.14 mg/dL — ABNORMAL HIGH (ref 0.61–1.24)
GFR, Estimated: 42 mL/min — ABNORMAL LOW (ref >60)
GFR, Estimated: 32 mL/min — ABNORMAL LOW (ref >60)
Glucose, Bld: 140 mg/dL — ABNORMAL HIGH (ref 70–99)
Glucose, Bld: 158 mg/dL — ABNORMAL HIGH (ref 70–99)
Potassium: 4.2 mmol/L (ref 3.5–5.1)
Potassium: 3.8 mmol/L (ref 3.5–5.1)
Sodium: 139 mmol/L (ref 135–145)
Sodium: 138 mmol/L (ref 135–145)

## 2021-01-13 LAB — RENAL FUNCTION PANEL
Albumin: 2.4 g/dL — ABNORMAL LOW (ref 3.5–5.0)
Anion gap: 7 (ref 5–15)
BUN: 47 mg/dL — ABNORMAL HIGH (ref 8–23)
CO2: 28 mmol/L (ref 22–32)
Calcium: 8.1 mg/dL — ABNORMAL LOW (ref 8.9–10.3)
Chloride: 104 mmol/L (ref 98–111)
Creatinine, Ser: 1.69 mg/dL — ABNORMAL HIGH (ref 0.61–1.24)
GFR, Estimated: 43 mL/min — ABNORMAL LOW (ref >60)
Glucose, Bld: 140 mg/dL — ABNORMAL HIGH (ref 70–99)
Phosphorus: 2.9 mg/dL (ref 2.5–4.6)
Potassium: 4.2 mmol/L (ref 3.5–5.1)
Sodium: 139 mmol/L (ref 135–145)

## 2021-01-13 LAB — CBC
HCT: 25.7 % — ABNORMAL LOW (ref 39.0–52.0)
Hemoglobin: 8.2 g/dL — ABNORMAL LOW (ref 13.0–17.0)
MCH: 30.4 pg (ref 26.0–34.0)
MCHC: 31.9 g/dL (ref 30.0–36.0)
MCV: 95.2 fL (ref 80.0–100.0)
Platelets: 185 10*3/uL (ref 150–400)
RBC: 2.7 MIL/uL — ABNORMAL LOW (ref 4.22–5.81)
RDW: 14.8 % (ref 11.5–15.5)
WBC: 18.2 10*3/uL — ABNORMAL HIGH (ref 4.0–10.5)
nRBC: 0 % (ref 0.0–0.2)

## 2021-01-13 LAB — POCT I-STAT EG7
Acid-Base Excess: 4 mmol/L — ABNORMAL HIGH (ref 0.0–2.0)
Bicarbonate: 26.7 mmol/L (ref 20.0–28.0)
Calcium, Ion: 1.13 mmol/L — ABNORMAL LOW (ref 1.15–1.40)
HCT: 25 % — ABNORMAL LOW (ref 39.0–52.0)
Hemoglobin: 8.5 g/dL — ABNORMAL LOW (ref 13.0–17.0)
O2 Saturation: 95 %
Patient temperature: 98
Potassium: 3.8 mmol/L (ref 3.5–5.1)
Sodium: 140 mmol/L (ref 135–145)
TCO2: 28 mmol/L (ref 22–32)
pCO2, Ven: 31.6 mmHg — ABNORMAL LOW (ref 44.0–60.0)
pH, Ven: 7.535 — ABNORMAL HIGH (ref 7.250–7.430)
pO2, Ven: 64 mmHg — ABNORMAL HIGH (ref 32.0–45.0)

## 2021-01-13 LAB — POCT I-STAT 7, (LYTES, BLD GAS, ICA,H+H)
Acid-Base Excess: 5 mmol/L — ABNORMAL HIGH (ref 0.0–2.0)
Bicarbonate: 27.6 mmol/L (ref 20.0–28.0)
Calcium, Ion: 1.11 mmol/L — ABNORMAL LOW (ref 1.15–1.40)
HCT: 22 % — ABNORMAL LOW (ref 39.0–52.0)
Hemoglobin: 7.5 g/dL — ABNORMAL LOW (ref 13.0–17.0)
O2 Saturation: 99 %
Patient temperature: 99.1
Potassium: 4.1 mmol/L (ref 3.5–5.1)
Sodium: 140 mmol/L (ref 135–145)
TCO2: 29 mmol/L (ref 22–32)
pCO2 arterial: 31.1 mmHg — ABNORMAL LOW (ref 32.0–48.0)
pH, Arterial: 7.557 — ABNORMAL HIGH (ref 7.350–7.450)
pO2, Arterial: 117 mmHg — ABNORMAL HIGH (ref 83.0–108.0)

## 2021-01-13 LAB — APTT: aPTT: 32 seconds (ref 24–36)

## 2021-01-13 LAB — GLUCOSE, CAPILLARY
Glucose-Capillary: 133 mg/dL — ABNORMAL HIGH (ref 70–99)
Glucose-Capillary: 144 mg/dL — ABNORMAL HIGH (ref 70–99)
Glucose-Capillary: 130 mg/dL — ABNORMAL HIGH (ref 70–99)
Glucose-Capillary: 128 mg/dL — ABNORMAL HIGH (ref 70–99)
Glucose-Capillary: 158 mg/dL — ABNORMAL HIGH (ref 70–99)

## 2021-01-13 LAB — CULTURE, RESPIRATORY W GRAM STAIN: Culture: NORMAL

## 2021-01-13 LAB — COOXEMETRY PANEL
Carboxyhemoglobin: 1.1 % (ref 0.5–1.5)
Methemoglobin: 0.9 % (ref 0.0–1.5)
O2 Saturation: 57.3 %
Total hemoglobin: 8.5 g/dL — ABNORMAL LOW (ref 12.0–16.0)

## 2021-01-13 LAB — MAGNESIUM: Magnesium: 2.4 mg/dL (ref 1.7–2.4)

## 2021-01-13 MED ORDER — FENTANYL CITRATE (PF) 100 MCG/2ML IJ SOLN
INTRAMUSCULAR | Status: AC
  Administered 2021-01-13: 100 ug
  Filled 2021-01-13: qty 2

## 2021-01-13 MED ORDER — SODIUM CHLORIDE 0.9 % IV SOLN: 2.0000 g | Freq: Two times a day (BID) | INTRAVENOUS | Status: DC

## 2021-01-13 MED ORDER — ETOMIDATE 2 MG/ML IV SOLN
INTRAVENOUS | Status: AC
  Administered 2021-01-13: 20 mg
  Filled 2021-01-13: qty 20

## 2021-01-13 MED ORDER — PROPOFOL 1000 MG/100ML IV EMUL
5.0000 ug/kg/min | INTRAVENOUS | Status: DC
  Administered 2021-01-13 – 2021-01-14 (×2): 10 ug/kg/min via INTRAVENOUS
  Filled 2021-01-13 (×4): qty 100

## 2021-01-13 MED ORDER — IPRATROPIUM-ALBUTEROL 0.5-2.5 (3) MG/3ML IN SOLN
3.0000 mL | RESPIRATORY_TRACT | Status: DC
  Administered 2021-01-13 – 2021-01-16 (×17): 3 mL via RESPIRATORY_TRACT
  Filled 2021-01-13 (×17): qty 3

## 2021-01-13 MED ORDER — MIDAZOLAM HCL 2 MG/2ML IJ SOLN
INTRAMUSCULAR | Status: AC
  Administered 2021-01-13: 2 mg
  Filled 2021-01-13: qty 2

## 2021-01-13 MED ORDER — PIPERACILLIN-TAZOBACTAM 3.375 G IVPB 30 MIN
3.3750 g | Freq: Four times a day (QID) | INTRAVENOUS | Status: DC
  Filled 2021-01-13: qty 50

## 2021-01-13 MED ORDER — ROCURONIUM BROMIDE 10 MG/ML (PF) SYRINGE
PREFILLED_SYRINGE | INTRAVENOUS | Status: AC
  Administered 2021-01-13: 100 mg
  Filled 2021-01-13: qty 10

## 2021-01-13 MED ORDER — MODAFINIL 100 MG PO TABS
100.0000 mg | ORAL_TABLET | Freq: Every day | ORAL | Status: DC
  Administered 2021-01-14: 100 mg via ORAL
  Filled 2021-01-13: qty 1

## 2021-01-13 MED ORDER — CHLORHEXIDINE GLUCONATE 0.12 % MT SOLN
OROMUCOSAL | Status: AC
  Administered 2021-01-13: 15 mL via OROMUCOSAL
  Filled 2021-01-13: qty 15

## 2021-01-13 MED ORDER — IPRATROPIUM-ALBUTEROL 0.5-2.5 (3) MG/3ML IN SOLN
RESPIRATORY_TRACT | Status: AC
  Administered 2021-01-13: 3 mL
  Filled 2021-01-13: qty 3

## 2021-01-13 MED ORDER — PIPERACILLIN-TAZOBACTAM 3.375 G IVPB
3.3750 g | Freq: Three times a day (TID) | INTRAVENOUS | Status: DC
  Administered 2021-01-13 – 2021-01-15 (×6): 3.375 g via INTRAVENOUS
  Filled 2021-01-13 (×5): qty 50

## 2021-01-13 NOTE — Progress Notes (Signed)
Failed extubation trial due to inability to handle secretions and weak cough likely from his strokes.  Awake and following commands easily.  Reintubated by RT Hopper without issue.  Would benefit from tracheostomy later this week which he agreed to prior to reintubation, I will call POA.  CRRT stopped, will see how kidneys do.  Erskine Emery MD

## 2021-01-13 NOTE — Progress Notes (Addendum)
[facility] ROUNDING NOTE   Subjective:   Interval History: [name] is a 70 y.o. male.  He was admitted 01/03/2021.  Has a history of hypertension hyperlipidemia coronary artery disease presented with chest pain found to have NSTEMI.  He underwent left heart catheterization with placement of TVP for bradycardia.  He underwent placement of Impella device and stenting of the RCA.  He had a VF arrest during the procedure prompting intubation.  Once Impella began functioning he achieved ROSC.   He has an ejection fraction of 20-25% on 2D echo.  He is undergoing neurological evaluation appreciate the assistance of Dr. Hortense Ramal for diffuse encephalopathy on EEG and an MRI that revealed micro emboli.  His course is also been complicated by hemorrhagic shock and bleeding from the left groin Impella transfused 4 units packed red blood cells.  Impella device was removed 01/09/2021.  He has baseline serum creatinine which is within normal range.  He has developed acute nonoliguric renal failure.  His home medications do include Cozaar 100 mg daily  Blood pressure 113/42 pulse 79 temperature 98.5 O2 sats 84% FiO2 40%  Sodium 139 potassium 4.2 chloride 105 CO2 28 BUN 48 creatinine 1.7 glucose 140 calcium 8.2 albumin 2.4 hemoglobin 8.2  Urinalysis bland.  Ultrasound showed a right simple renal cyst negative for obstructive uropathy  Patient was initiated on CRRT 01/11/2021 for volume control.  He continues in a negative fluid balance but 1 to 2 L a day   Objective:  Vital signs in last 24 hours:  Temp:  [98 F (36.7 C)-99.1 F (37.3 C)] 98.5 F (36.9 C) (04/24 0800) Pulse Rate:  [65-82] 65 (04/24 0401) Resp:  [14-38] 21 (04/24 0900) BP: (65-135)/(51-88) 118/75 (04/23 1700) SpO2:  [98 %-100 %] 100 % (04/24 0850) Arterial Line BP: (101-165)/(42-75) 121/45 (04/24 0900) FiO2 (%):  [40 %] 40 % (04/24 0850) Weight:  [94.8 kg] 94.8 kg (04/24 0359)  Weight change: -2.1 kg Filed Weights    01/11/21 0500 01/12/21 0435 01/13/21 0359  Weight: 98.2 kg 96.9 kg 94.8 kg    Intake/Output: I/O last 3 completed shifts: In: 3152.3 [I.V.:502.9; NG/GT:2315; IV Piggyback:334.4] Out: 9518 [ACZYS:0630; ZSWFU:9323]   Intake/Output this shift:  Total I/O In: 181.6 [I.V.:14.6; NG/GT:167] Out: 168 [Other:168] General: Ill-appearing gentleman intubated HEENT: Moist mucous membranes ET tube Eyes: Pupils reactive Neck: JVP elevated Heart: Regular rate and rhythm no murmurs rubs gallops Lungs: Mechanically supported breath sounds Abdomen: Soft nontender hypoactive bowel sounds Extremities: 2-3+ pitting edema lower extremities Skin: No cyanosis clubbing rash      Basic Metabolic Panel: Recent Labs  Lab 01/08/21 0341 01/08/21 1237 01/09/21 0700 01/09/21 2016 01/10/21 0429 01/10/21 0609 01/11/21 0405 01/11/21 0411 01/11/21 1532 01/12/21 0317 01/12/21 0510 01/12/21 0648 01/12/21 1207 01/12/21 1557 01/12/21 1604 01/13/21 0402 01/13/21 0430  NA 139   < > 142   < > 144   < > 147*   < > 146*   < > 141   < > 143 141 142 140 139  139  K 3.6   < > 3.2*   < > 3.7   < > 3.4*   < > 3.5   < > 3.7   < > 4.1 4.4 4.3 4.1 4.2  4.2  CL 101   < > 100   < > 101   < > 104  --  106  --  104  --   --  105  --   --  105  104  CO2 26   < > 30   < > 29   < > 29  --  29  --  29  --   --  25  --   --  28  28  GLUCOSE 135*   < > 168*   < > 174*   < > 179*  --  151*  --  147*  --   --  128*  --   --  140*  140*  BUN 52*   < > 66*   < > 89*   < > 114*  --  101*  --  73*  --   --  55*  --   --  48*  47*  CREATININE 3.75*   < > 4.08*   < > 4.71*   < > 5.03*  --  4.20*  --  2.99*  --   --  2.11*  --   --  1.72*  1.69*  CALCIUM 8.0*   < > 7.8*   < > 7.8*   < > 8.0*  --  8.0*  --  8.1*  --   --  8.1*  --   --  8.2*  8.1*  MG 2.0  --  1.9  --  2.5*  --   --   --   --   --  2.5*  --   --   --   --   --  2.4  PHOS 7.0*  --   --   --   --   --   --   --  4.6  --  2.9  --   --  3.3  --   --  2.9   < > =  values in this interval not displayed.    Liver Function Tests: Recent Labs  Lab 01/08/21 0341 01/10/21 0429 01/11/21 1532 01/12/21 0510 01/12/21 1557 01/13/21 0430  AST 34 39  --   --   --   --   ALT 28 32  --   --   --   --   ALKPHOS 104 154*  --   --   --   --   BILITOT 1.0 0.9  --   --   --   --   PROT 5.5* 5.1*  --   --   --   --   ALBUMIN 1.9* 1.7* 2.8* 2.6* 2.5* 2.4*   No results for input(s): LIPASE, AMYLASE in the last 168 hours. No results for input(s): AMMONIA in the last 168 hours.  CBC: Recent Labs  Lab 01/10/21 0429 01/10/21 0609 01/11/21 0405 01/11/21 0411 01/11/21 1216 01/12/21 0317 01/12/21 0510 01/12/21 0648 01/12/21 1207 01/12/21 1604 01/13/21 0402 01/13/21 0430  WBC 7.9  --  6.5  --  7.1  --  10.3  --   --   --   --  18.2*  HGB 9.2*   < > 7.8*   < > 7.6*   < > 8.3* 8.8* 8.5* 8.5* 7.5* 8.2*  HCT 27.8*   < > 24.5*   < > 24.0*   < > 26.5* 26.0* 25.0* 25.0* 22.0* 25.7*  MCV 91.1  --  93.5  --  93.0  --  95.0  --   --   --   --  95.2  PLT 91*  --  101*  --  114*  --  155  --   --   --   --  185   < > = values in this interval not displayed.    Cardiac Enzymes: Recent Labs  Lab 01/11/21 1216  CKTOTAL 316    BNP: Invalid input(s): POCBNP  CBG: Recent Labs  Lab 01/12/21 1601 01/12/21 2037 01/12/21 2330 01/13/21 0428 01/13/21 0803  GLUCAP 118* 138* 133* 144* 130*    Microbiology: Results for orders placed or performed during the hospital encounter of 01/03/21  Resp Panel by RT-PCR (Flu A&B, Covid) Nasopharyngeal Swab     Status: None   Collection Time: 01/03/21  7:54 AM   Specimen: Nasopharyngeal Swab; Nasopharyngeal(NP) swabs in vial transport medium  Result Value Ref Range Status   SARS Coronavirus 2 by RT PCR NEGATIVE NEGATIVE Final    Comment: (NOTE) SARS-CoV-2 target nucleic acids are NOT DETECTED.  The SARS-CoV-2 RNA is generally detectable in upper respiratory specimens during the acute phase of infection. The  lowest concentration of SARS-CoV-2 viral copies this assay can detect is 138 copies/mL. A negative result does not preclude SARS-Cov-2 infection and should not be used as the sole basis for treatment or other patient management decisions. A negative result may occur with  improper specimen collection/handling, submission of specimen other than nasopharyngeal swab, presence of viral mutation(s) within the areas targeted by this assay, and inadequate number of viral copies(<138 copies/mL). A negative result must be combined with clinical observations, patient history, and epidemiological information. The expected result is Negative.  Fact Sheet for Patients:  EntrepreneurPulse.com.au  Fact Sheet for Healthcare Providers:  IncredibleEmployment.be  This test is no t yet approved or cleared by the [place] FDA and  has been authorized for detection and/or diagnosis of SARS-CoV-2 by FDA under an Emergency Use Authorization (EUA). This EUA will remain  in effect (meaning this test can be used) for the duration of the COVID-19 declaration under Section 564(b)(1) of the Act, 21 U.S.C.section 360bbb-3(b)(1), unless the authorization is terminated  or revoked sooner.       Influenza A by PCR NEGATIVE NEGATIVE Final   Influenza B by PCR NEGATIVE NEGATIVE Final    Comment: (NOTE) The Xpert Xpress SARS-CoV-2/FLU/RSV plus assay is intended as an aid in the diagnosis of influenza from Nasopharyngeal swab specimens and should not be used as a sole basis for treatment. Nasal washings and aspirates are unacceptable for Xpert Xpress SARS-CoV-2/FLU/RSV testing.  Fact Sheet for Patients: EntrepreneurPulse.com.au  Fact Sheet for Healthcare Providers: IncredibleEmployment.be  This test is not yet approved or cleared by the [place] FDA and has been authorized for detection and/or diagnosis of SARS-CoV-2 by FDA under  an Emergency Use Authorization (EUA). This EUA will remain in effect (meaning this test can be used) for the duration of the COVID-19 declaration under Section 564(b)(1) of the Act, 21 U.S.C. section 360bbb-3(b)(1), unless the authorization is terminated or revoked.  Performed at [facility], [place] [address], [place], [place] [address]   MRSA PCR Screening     Status: None   Collection Time: 01/03/21  3:30 PM   Specimen: Nasopharyngeal  Result Value Ref Range Status   MRSA by PCR NEGATIVE NEGATIVE Final    Comment:        The GeneXpert MRSA Assay (FDA approved for NASAL specimens only), is one component of a comprehensive MRSA colonization surveillance program. It is not intended to diagnose MRSA infection nor to guide or monitor treatment for MRSA infections. Performed at [facility], [place] [address], [place], [place] [address]   Culture, Respiratory  w Gram Stain     Status: None (Preliminary result)   Collection Time: 01/11/21 11:24 AM   Specimen: Tracheal Aspirate; Respiratory  Result Value Ref Range Status   Specimen Description TRACHEAL ASPIRATE  Final   Special Requests NONE  Final   Gram Stain   Final    ABUNDANT WBC PRESENT,BOTH PMN AND MONONUCLEAR ABUNDANT GRAM NEGATIVE RODS FEW GRAM VARIABLE ROD FEW YEAST    Culture   Final    CULTURE REINCUBATED FOR BETTER GROWTH Performed at [facility], [place] [address] [place], [place] [address]    Report Status PENDING  Incomplete    Coagulation Studies: No results for input(s): LABPROT, INR in the last 72 hours.  Urinalysis: No results for input(s): COLORURINE, LABSPEC, PHURINE, GLUCOSEU, HGBUR, BILIRUBINUR, KETONESUR, PROTEINUR, UROBILINOGEN, NITRITE, LEUKOCYTESUR in the last 72 hours.  Invalid input(s): APPERANCEUR    Imaging: US RENAL  Result Date: 01/11/2021 CLINICAL DATA:  Acute kidney injury EXAM: RENAL / URINARY TRACT ULTRASOUND COMPLETE COMPARISON:  None. FINDINGS: Right Kidney: Renal  measurements: 12.6 x 5.3 x 6.5 cm = volume: 226 mL. Echogenicity within normal limits. Two simple cysts at the lower pole of the right kidney measuring up to 3.3 cm and 2.9 cm, respectively. No shadowing stone or hydronephrosis visualized. Left Kidney: Renal measurements: 12.9 x 6.4 x 6.2 cm = volume: 265 mL. Echogenicity within normal limits. No mass, shadowing stone, or hydronephrosis visualized. Bladder: Decompressed by Foley catheter. Other: None. IMPRESSION: 1. Negative for obstructive uropathy. 2. Simple right renal cysts measuring up to 3.3 cm. Electronically Signed   By: Davina Poke D.O.   On: 01/11/2021 15:07   DG CHEST PORT 1 VIEW  Result Date: 01/11/2021 CLINICAL DATA:  Central line placement EXAM: PORTABLE CHEST 1 VIEW COMPARISON:  01/08/2021 FINDINGS: Endotracheal tube in good position. Swan-Ganz catheter removed. Right jugular sheath remains in place. NG tube in the stomach. Impella device removed in the interval. Interval placement of dual lumen central venous catheter from left jugular approach. Tip in the lower SVC at the cavoatrial junction. No pneumothorax. Progression of bibasilar airspace disease. No significant effusion. Upper lobes clear. IMPRESSION: Left jugular central venous catheter tip at the cavoatrial junction. No pneumothorax Progression of bibasilar atelectasis/infiltrate. Electronically Signed   By: Franchot Gallo M.D.   On: 01/11/2021 11:34   Overnight EEG with video  Result Date: 01/12/2021 Lora Havens, MD     01/12/2021  3:34 PM Patient Name:[name] LOV:[id number] Epilepsy Attending:Priyanka Barbra Sarks Referring Physician/Provider:Dr. Zeb Comfort Duration:01/11/2021 [phone] to 01/12/2021 0933  Patient history:70 year old male with seizure-like episode. EEG to evaluate forseizures.  Level of alertness:Lethargic  AEDs during EEG study:LEV  Technical aspects: This EEG study was done with scalp electrodes positioned according to the 10-20  International system of electrode placement. Electrical activity was acquired at a sampling rate of _0  and reviewed with a high frequency filter of _1  and a low frequency filter of _2 . EEG data were recorded continuously and digitally stored.  Description:EEG showed continuous generalized predominantly 5 to 7 Hz theta as well as intermittent 2-_3  delta slowing. Event button was pressed on 01/11/2021 at [place] and 2218 for unclear reasons.Concomitant EEG before, during and after the event didn't show any eeg change to suggest seizure. Hyperventilation and photic stimulation were not performed.   ABNORMALITY - Continuous slow, generalized  IMPRESSION: This studyis suggestive of moderate diffuse encephalopathy, nonspecific etiology.  Event button was pressed on 01/11/2021 at [place] and 2218 for unclear reasons without  concomitant EEG change and were most likely NOT epileptic.   Priyanka Barbra Sarks    Medications:   .  prismasol BGK 4/2.5 500 mL/hr at 01/12/21 2107  .  prismasol BGK 4/2.5 500 mL/hr at 01/12/21 2129  . sodium chloride 250 mL (01/11/21 1007)  . dexmedetomidine (PRECEDEX) IV infusion 0.3 mcg/kg/hr (01/13/21 0900)  . dextrose    . feeding supplement (VITAL 1.5 CAL) 1,000 mL (01/12/21 1808)  . ferric gluconate (FERRLECIT/NULECIT) IV Stopped (01/12/21 1148)  . heparin 10,000 units/ 20 mL infusion syringe 500 Units/hr (01/12/21 1836)  . levETIRAcetam 750 mg (01/13/21 0910)  . prismasol BGK 4/2.5 2,000 mL/hr at 01/13/21 0223   . aspirin  81 mg Per Tube Daily  . chlorhexidine gluconate (MEDLINE KIT)  15 mL Mouth Rinse BID  . Chlorhexidine Gluconate Cloth  6 each Topical Daily  . feeding supplement (PROSource TF)  45 mL Per Tube TID  . insulin aspart  2-6 Units Subcutaneous Q4H  . mouth rinse  15 mL Mouth Rinse 10 times per day  . pantoprazole sodium  40 mg Per Tube Daily  . polyethylene glycol  17 g Per Tube BID  . rosuvastatin  10 mg Per Tube Daily  . sodium chloride flush   10-40 mL Intracatheter Q12H  . sodium chloride flush  3 mL Intravenous Q12H  . ticagrelor  90 mg Per Tube BID   sodium chloride, sodium chloride, acetaminophen (TYLENOL) oral liquid 160 mg/5 mL, dextrose, fentaNYL (SUBLIMAZE) injection, heparin, LORazepam, ondansetron (ZOFRAN) IV, sodium chloride, sodium chloride flush, sodium chloride flush  Assessment/ Plan:  1.Acute kidney injury.  Baseline serum creatinine 0.9 mg/dL it appears that the acute kidney injury is related to his cardiogenic/hemorrhagic shock.  He is now no longer requiring pressors.  He is nonoliguric and has responded to IV Lasix.  He is still edematous.  We have been asked to consider CRRT.  I believe this is a reasonable approach in order to help augment his volume status.  His urinalysis is bland.    Renal ultrasound was unremarkable.  Avoid nephrotoxins including IV contrast.  He did receive a left-sided heart catheterization 01/03/2021.    CRRT initiated 01/11/2021 - 01/13/21   Will stop 2. Hypertension/volume  -still markedly volume overloaded despite a good diuretic response with IV Lasix.  Discussed with Dr Tamala Julian  Will stop CRRT 3.  Anemia  .  Some iron deficiency noted on studies not a candidate for ESA until replete.  Would recommend transfusion per CCM recommendation 4.  Electrolytes: Potassium slightly decreased.  We will continue to follow on CRRT. 5.  Encephalopathy: Microemboli on MRI.  No evidence of seizure activities continues on Keppra. 6.  Congestive heart failure with diastolic dysfunction ejection fraction 20%.  Status post stent and Impella.  Followed by cardiology 7.  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

## 2021-01-13 NOTE — Progress Notes (Addendum)
Pharmacy Antibiotic Note  Whitney Bingaman is a 70 y.o. male admitted on 01/03/2021 with STEMI c/b cardiogenic shock. Received 7 days of empiric abx that was completed 4/20. Cefepime was switched to ceftriaxone earlier this admission due to concern for cefepime neurotoxicity. Pharmacy asked to start Zosyn for rising wbc 18.2 and worsening clinical picture.   Renal function improved on CRRT but now stopping CRRT this AM given improved volume status and making some uop.   Plan: Start Zosyn 3.375 gm IV q8 hrs (EI) F/u CRRT tolerance and clinical progression   Height: 6\' 1"  (185.4 cm) Weight: 94.8 kg (208 lb 15.9 oz) IBW/kg (Calculated) : 79.9  Temp (24hrs), Avg:98.5 F (36.9 C), Min:98 F (36.7 C), Max:99.1 F (37.3 C)  Recent Labs  Lab 01/10/21 0429 01/10/21 1645 01/11/21 0405 01/11/21 1216 01/11/21 1532 01/12/21 0510 01/12/21 1557 01/13/21 0430  WBC 7.9  --  6.5 7.1  --  10.3  --  18.2*  CREATININE 4.71*   < > 5.03*  --  4.20* 2.99* 2.11* 1.72*  1.69*   < > = values in this interval not displayed.    Estimated Creatinine Clearance: 45.2 mL/min (A) (by C-G formula based on SCr of 1.72 mg/dL (H)).    Allergies  Allergen Reactions  . Codeine Other (See Comments)    hallucinations  . Lisinopril Cough    Antimicrobials this admission: Vanc 4/14>4/19 Cefepime 4/14>4/18 (switched d/t c/o cefepime neurotoxicity) Ceftriaxone 4/19>> 4/20 Zosyn 4/24 >>   Microbiology results: 4/14 Covid/flu neg  4/14 MRSA PCR: neg 4/22 Sputum: normal flora 4/24 Sputum:   Richardine Service, PharmD, BCPS PGY2 Cardiology Pharmacy Resident Phone: [phone] 01/13/2021  10:07 AM  Please check [email or web address] for unit-specific pharmacy phone numbers.

## 2021-01-13 NOTE — Progress Notes (Signed)
OGT advanced further to 43cm per radiologists recommendation.

## 2021-01-13 NOTE — Procedures (Signed)
Intubation Procedure Note  [name]  [id number]  [birth year]  Date:01/13/21  Time:2:05 PM   Provider Performing:Chanley Mcenery, Aleene Davidson    Procedure: Intubation ([address])  Indication(s) Respiratory Failure  Consent Risks of the procedure as well as the alternatives and risks of each were explained to the patient and/or caregiver.  Consent for the procedure was obtained and is signed in the bedside chart   Anesthesia Etomidate, Versed, Fentanyl and Rocuronium   Time Out Verified patient identification, verified procedure, site/side was marked, verified correct patient position, special equipment/implants available, medications/allergies/relevant history reviewed, required imaging and test results available.   Sterile Technique Usual hand hygeine, masks, and gloves were used   Procedure Description Patient positioned in bed supine.  Sedation given as noted above.  Patient was intubated with endotracheal tube using Glidescope.  View was Grade 1 full glottis .  Number of attempts was 1.  Colorimetric CO2 detector was consistent with tracheal placement.   Complications/Tolerance None; patient tolerated the procedure well. Chest X-ray is ordered to verify placement.   EBL none   Specimen(s) None

## 2021-01-13 NOTE — Progress Notes (Signed)
Patient ID: [name], male   DOB: 25-Dec-1950, 70 y.o.   MRN: [id number]     Advanced Heart Failure Rounding Note  PCP-Cardiologist: No primary care provider on file.   Subjective:    4/20 Impella removed. MRI brain with numerous micro embolic infarctions. Off pressors.  4/21 lasix drip stopped.  4/22 started CVVH  CO-OX 57%, no pressors/inotropes.  UOP 842 and also getting CVVH, pulling about 25 cc/hr net UF.   Weight down 4 lbs.  CVP 5.   Remains intubated FiO2 40%.  He will awaken and follow commands.    Objective:   Weight Range: 94.8 kg Body mass index is 27.57 kg/m.   Vital Signs:   Temp:  [98 F (36.7 C)-99.5 F (37.5 C)] 98.4 F (36.9 C) (04/24 0430) Pulse Rate:  [65-91] 65 (04/24 0401) Resp:  [14-38] 18 (04/24 0700) BP: (65-143)/(51-88) 118/75 (04/23 1700) SpO2:  [98 %-100 %] 100 % (04/24 0401) Arterial Line BP: (101-165)/(42-75) 117/47 (04/24 0700) FiO2 (%):  [40 %] 40 % (04/24 0401) Weight:  [94.8 kg] 94.8 kg (04/24 0359) Last BM Date: 01/12/21  Weight change: Filed Weights   01/11/21 0500 01/12/21 0435 01/13/21 0359  Weight: 98.2 kg 96.9 kg 94.8 kg    Intake/Output:   Intake/Output Summary (Last 24 hours) at 01/13/2021 0752 Last data filed at 01/13/2021 0700 Gross per 24 hour  Intake 2447.34 ml  Output 2791 ml  Net -343.66 ml      Physical Exam   General: NAD, intubated.  Neck: No JVD, no thyromegaly or thyroid nodule.  Lungs: Clear to auscultation bilaterally with normal respiratory effort. CV: Nondisplaced PMI.  Heart regular S1/S2, no S3/S4, no murmur.  1+ ankle edema.  Abdomen: Soft, nontender, no hepatosplenomegaly, no distention.  Skin: Intact without lesions or rashes.  Neurologic: Awakens and follows commands.  Extremities: No clubbing or cyanosis.  HEENT: Normal.    Telemetry   SR 70-90s (personally reviewed)  Labs    CBC Recent Labs    01/12/21 0510 01/12/21 0648 01/13/21 0402 01/13/21 0430  WBC 10.3  --   --   18.2*  HGB 8.3*   < > 7.5* 8.2*  HCT 26.5*   < > 22.0* 25.7*  MCV 95.0  --   --  95.2  PLT 155  --   --  185   < > = values in this interval not displayed.   Basic Metabolic Panel Recent Labs    01/12/21 0510 01/12/21 0648 01/12/21 1557 01/12/21 1604 01/13/21 0402 01/13/21 0430  NA 141   < > 141   < > 140 139  139  K 3.7   < > 4.4   < > 4.1 4.2  4.2  CL 104  --  105  --   --  105  104  CO2 29  --  25  --   --  28  28  GLUCOSE 147*  --  128*  --   --  140*  140*  BUN 73*  --  55*  --   --  48*  47*  CREATININE 2.99*  --  2.11*  --   --  1.72*  1.69*  CALCIUM 8.1*  --  8.1*  --   --  8.2*  8.1*  MG 2.5*  --   --   --   --  2.4  PHOS 2.9  --  3.3  --   --  2.9   < > = values in  this interval not displayed.   Liver Function Tests Recent Labs    01/12/21 1557 01/13/21 0430  ALBUMIN 2.5* 2.4*   No results for input(s): LIPASE, AMYLASE in the last 72 hours. Cardiac Enzymes Recent Labs    01/11/21 1216  CKTOTAL 316    BNP: BNP (last 3 results) No results for input(s): BNP in the last 8760 hours.  ProBNP (last 3 results) No results for input(s): PROBNP in the last 8760 hours.   D-Dimer No results for input(s): DDIMER in the last 72 hours. Hemoglobin A1C No results for input(s): HGBA1C in the last 72 hours. Fasting Lipid Panel Recent Labs    01/12/21 0510  TRIG 103   Thyroid Function Tests No results for input(s): TSH, T4TOTAL, T3FREE, THYROIDAB in the last 72 hours.  Invalid input(s): FREET3  Other results:   Imaging    Overnight EEG with video  Result Date: 01/12/2021 Lora Havens, MD     01/12/2021  3:34 PM Patient Name:[name] SNK:[id number] Epilepsy Attending:Priyanka Barbra Sarks Referring Physician/Provider:Dr. Zeb Comfort Duration:01/11/2021 [phone] to 01/12/2021 0933  Patient history:70 year old male with seizure-like episode. EEG to evaluate forseizures.  Level of alertness:Lethargic  AEDs during EEG study:LEV   Technical aspects: This EEG study was done with scalp electrodes positioned according to the 10-20 International system of electrode placement. Electrical activity was acquired at a sampling rate of 500Hz  and reviewed with a high frequency filter of 70Hz  and a low frequency filter of 1Hz . EEG data were recorded continuously and digitally stored.  Description:EEG showed continuous generalized predominantly 5 to 7 Hz theta as well as intermittent 2-3hz  delta slowing. Event button was pressed on 01/11/2021 at [place] and 2218 for unclear reasons.Concomitant EEG before, during and after the event didn't show any eeg change to suggest seizure. Hyperventilation and photic stimulation were not performed.   ABNORMALITY - Continuous slow, generalized  IMPRESSION: This studyis suggestive of moderate diffuse encephalopathy, nonspecific etiology.  Event button was pressed on 01/11/2021 at [place] and 2218 for unclear reasons without concomitant EEG change and were most likely NOT epileptic.   Priyanka Barbra Sarks    Medications:     Scheduled Medications: . aspirin  81 mg Per Tube Daily  . chlorhexidine gluconate (MEDLINE KIT)  15 mL Mouth Rinse BID  . Chlorhexidine Gluconate Cloth  6 each Topical Daily  . feeding supplement (PROSource TF)  45 mL Per Tube TID  . insulin aspart  2-6 Units Subcutaneous Q4H  . mouth rinse  15 mL Mouth Rinse 10 times per day  . pantoprazole sodium  40 mg Per Tube Daily  . polyethylene glycol  17 g Per Tube BID  . rosuvastatin  10 mg Per Tube Daily  . sodium chloride flush  10-40 mL Intracatheter Q12H  . sodium chloride flush  3 mL Intravenous Q12H  . ticagrelor  90 mg Per Tube BID    Infusions: .  prismasol BGK 4/2.5 500 mL/hr at 01/12/21 2107  .  prismasol BGK 4/2.5 500 mL/hr at 01/12/21 2129  . sodium chloride 250 mL (01/11/21 1007)  . dexmedetomidine (PRECEDEX) IV infusion 0.3 mcg/kg/hr (01/13/21 0700)  . dextrose    . feeding supplement (VITAL 1.5 CAL) 1,000 mL  (01/12/21 1808)  . ferric gluconate (FERRLECIT/NULECIT) IV Stopped (01/12/21 1148)  . heparin 10,000 units/ 20 mL infusion syringe 500 Units/hr (01/12/21 1836)  . levETIRAcetam Stopped (01/12/21 2220)  . prismasol BGK 4/2.5 2,000 mL/hr at 01/13/21 0223    PRN Medications: sodium chloride, sodium  chloride, acetaminophen (TYLENOL) oral liquid 160 mg/5 mL, dextrose, fentaNYL (SUBLIMAZE) injection, heparin, LORazepam, ondansetron (ZOFRAN) IV, sodium chloride, sodium chloride flush, sodium chloride flush    Assessment/Plan   1. CAD/ Acute Inferior STEMI - emergent cath w/ pRCA occlusion s/p PCI + DES - Initial angiography showed sluggish flow in LAD and LCX>>improved post impella placement  - Echo LVEF 20-25%, RV moderately reduced - DAPT w/ ASA + Brilinta . Hgb stable 8.2. No obvious bleeding.  - Crestor 10 per tube   2. VF Arrest - in setting of acute MI, now s/p revascularization  - no further ectopy on tele. Amio stopped - keep K > 4.0 and Mg > 2.0  - EEG with diffuse encephalopathy but no seizure activity. - MRI brain micro-emboli.    3. Shock, Combination Cardiogenic + Hemorrhagic  - Acute MI + developed severe hemorrhagic shock due to bleeding at left groin Impella Transfused 4U PRBCs . - Impella out 4/20  - Hgb stable. - Co-ox 57%, now off pressors/inotropes.   4. Acute Systolic Heart Failure>>Cardiogenic Shock  - ICM Echo LVEF 20-25%, RV moderately reduced - Impella out 4/20. Off all pressors.  - Volume status improved. CVP 5 today.  Would keep CVVH even for now and think we can stop CVVH altogether today.   - No bb with shock.  - No spiro/arni/dig with AKI.   5. Acute hypoxic respiratory failure - now on 40% FIO2 - Now that he is waking up/following commands, hopefully can extubate.   5. Nonoliguric AKI - due to ATN/shock - Creatinine up to 5 with concern for uremia contributing to neurologic dysfunction, so CVVH begun 4/22.   - UOP 842 yesterday and CVP 5, can  run CVVH for even for now and think we can stop CVVH today.  Renal to see.   6. Hypokalemia/hyperkalemia - CVVH  7. Leukocytosis - Resolved.  8. DM2 - off insulin gtt - now on levimir and SSI  9. Encephalopathy -->uremia, acute embolic strokes.  - post cardiac arrest - head CT negative - EEG w/ generalized periodic discharges with triphasic morphology + moderate to severe diffuse encephalopathy.  - ? cefepime toxicity +/- anoxic /hypoxic brain injury. Now off cefepime - neurology following   - MRI- numerous micro embolic infarctions . No hemorrhage or mass.  - On keppra - CVVH begun 4/22 for ?uremia contribution to encephalopathy - This morning, he will awaken and follow commands.   Length of Stay: [place] Performed by: Loralie Champagne  Total critical care time: 35 minutes  Critical care time was exclusive of separately billable procedures and treating other patients.  Critical care was necessary to treat or prevent imminent or life-threatening deterioration.  Critical care was time spent personally by me (independent of midlevel providers or residents) on the following activities: development of treatment plan with patient and/or surrogate as well as nursing, discussions with consultants, evaluation of patient's response to treatment, examination of patient, obtaining history from patient or surrogate, ordering and performing treatments and interventions, ordering and review of laboratory studies, ordering and review of radiographic studies, pulse oximetry and re-evaluation of patient's condition.  Loralie Champagne, MD  7:52 AM

## 2021-01-13 NOTE — Progress Notes (Signed)
NAME:  [name], MRN:  [id number], DOB:  Jun 06, 1951, LOS: 24 ADMISSION DATE:  01/03/2021, CONSULTATION DATE:  01/03/2021 REFERRING MD:  Dr. Haroldine Laws, CHIEF COMPLAINT:  STEMI/ cardiogenic shock  History of Present Illness:   70 year old male with PMHx HTN, HLD, CAD presenting via EMS with sudden onset of CP, found to have inferior STEMI with bradycardia and hypotension.  Taken emergently to Cath Lab on arrival.  Temporary pacer was placed with good capture; however, patient then developed profound hypotension decompensating into cardiac arrest, requiring intubation by anesthesia and CPR with episodes of VF requiring defibrillation and amiodarone. Patient was found to have total proximal RCA occlusion and underwent PCI to the RCA with stent placement.  Procedure was further c/b cardiogenic shock (requiring placement of Impella device) c/b bleeding secondary to cangrelor with development of bleeding and hematoma at puncture sites, Hgb dropped from 12.6 to 7.5 requiring emergent transfusion of 4U PRBCs and cangrelor discontinuation. Patient required vasopressors on initial presentation to ICU. Patient returned to ICU intubated and sedated in cardiogenic shock with TVP and Impella. PCCM consulted for vent management.   Pertinent  Medical History  HTN, HLD, CAD  Significant Hospital Events: Including procedures, antibiotic start and stop dates in addition to other pertinent events   . 4/14 Admitted Cards with inferior STEMI-> bradycardia s/p TVP, cardiogenic shock and cardiac arrest s/p impella with PCI/stent to RCA, complicated by bleeding s/p 4 units PRBC thought 2/2 cangrelor, pending coags.  Has R IJ PA cath, TVP R groin, impella L groin . 4/15 Remains on impella, weaning pressors and inotropes. Started diuresis due to significantly increased oxygen requirements. . 4/18 Concern for seizure-like activity, Neuro consult, LTM/EEG, CT Head NAICA/no evidence of edema. Transitioned to ceftriaxone  from cefepime. . 4/19 Remains on Impella, off pressors. Continuing diuresis with hopeful discontinuation of Impella, allowing for MRI 4/20-4/21. Continuing LTM EEG, given facial twitching/unclear neurologic status. . 4/20 >8L UOP/24H with Lasix gtt, Impella at P6, MRI Brain pending Impella removal . 4/21 >> impella out, started on CRRT waiting to wake up  Interim History / Subjective:  Waxing and waning alertness levels. Was nodding head appropriately this AM to me. Remains on CRRT.  Objective   Blood pressure 118/75, pulse 65, temperature 98.5 F (36.9 C), temperature source Axillary, resp. rate (!) 21, height 6\' 1"  (1.854 m), weight 94.8 kg, SpO2 100 %. CVP:  [1 mmHg-8 mmHg] 2 mmHg  Vent Mode: CPAP;PSV FiO2 (%):  [40 %] 40 % Set Rate:  [16 bmp] 16 bmp Vt Set:  [500 mL] 500 mL PEEP:  [5 cmH20] 5 cmH20 Pressure Support:  [8 cmH20] 8 cmH20 Plateau Pressure:  [16 cmH20] 16 cmH20   Intake/Output Summary (Last 24 hours) at 01/13/2021 6962 Last data filed at 01/13/2021 0900 Gross per 24 hour  Intake 2428.89 ml  Output 2659 ml  Net -230.11 ml   Filed Weights   01/11/21 0500 01/12/21 0435 01/13/21 0359  Weight: 98.2 kg 96.9 kg 94.8 kg   Physical Examination: Constitutional: ill appearing man on vent  Eyes: pupils reactive, starting to track Ears, nose, mouth, and throat: ETT in place, minimal bloody secretions Cardiovascular: RRR, ext warm Respiratory: doing well on PS Gastrointestinal: soft, +BS Skin: No rashes, normal turgor, pale, stable bruising on L shoulder Neurologic: nodded head no to pain, tracked with eyes Psychiatric: cannot assess  Labs improved on CRRT Alkalotic on ABG probably related to agitation  Labs/imaging that I have personally reviewed: (right click and "Reselect all SmartList  Selections" daily)  Coox 57% Chemistries look good White count up though? H/H stable  Resolved Hospital Problem list   Hemorrhagic shock Lactic acidosis  Assessment & Plan:   Inferior STEMI post PCI and stenting RCA  Cardiogenic shock improving  Volume overloaded state of heart improved  Hypervolemic AKI now on CRRT Anoxic brain injury- having some improvement on exam so giving more time; waxing waning levels of alertness Acute hypoxemic respiratory failure improved, mental status makes me hesitant to extubate but can give a shot today Question RLL HCAP- GNR on stain, rising white count, "normal flora grew", not sure I buy it.  will start Rx  - SBT and potential extubation today - DC precedex - Will discuss CRRT continuation with heart failure team - Start cefepime, f/u repeat tracheal aspirate culture - GDMT per CHF team  Best practice (right click and "Reselect all SmartList Selections" daily)  Diet:  Tube Feed  Pain/Anxiety/Delirium protocol (if indicated): Yes (RASS goal 0) to -1 VAP protocol (if indicated): Yes DVT prophylaxis: Systemic AC GI prophylaxis: PPI Glucose control:  SSI Yes and Basal insulin Yes Central venous access:  Yes, and it is still needed Arterial line:  Yes, and it is still needed Foley:  Yes, and it is still needed Mobility:  bed rest  PT consulted: N/A Last date of multidisciplinary goals of care discussion daily with POA, continue pushing, realistic expectations, cautious optimism with improvement in mental satus Code Status:  full code Disposition: ICU    Patient critically ill due to respiratory failure Interventions to address this today vent titration Risk of deterioration without these interventions is high  I personally spent 37 minutes providing critical care not including any separately billable procedures  Erskine Emery MD [facility]  Prefer epic messenger for cross cover needs If after hours, please call E-link

## 2021-01-13 NOTE — Procedures (Signed)
Extubation Procedure Note  Patient Details:   Name: Inocencio Roy DOB: [birth year] MRN: [id number]   Airway Documentation:    Vent end date: 01/13/21 Vent end time: 1208   Evaluation  O2 sats: stable throughout Complications: No apparent complications Patient did tolerate procedure well. Bilateral Breath Sounds: Rhonchi   Patient extubated per MD order & placed on 4L [place]. Patient has good cough post extubation. Will continue to monitor.  Kathie Dike 01/13/2021, 12:15 PM

## 2021-01-14 DIAGNOSIS — N179 Acute kidney failure, unspecified: Secondary | ICD-10-CM | POA: Diagnosis not present

## 2021-01-14 DIAGNOSIS — I5021 Acute systolic (congestive) heart failure: Secondary | ICD-10-CM | POA: Diagnosis not present

## 2021-01-14 DIAGNOSIS — R570 Cardiogenic shock: Secondary | ICD-10-CM | POA: Diagnosis not present

## 2021-01-14 DIAGNOSIS — I2510 Atherosclerotic heart disease of native coronary artery without angina pectoris: Secondary | ICD-10-CM | POA: Diagnosis not present

## 2021-01-14 DIAGNOSIS — R569 Unspecified convulsions: Secondary | ICD-10-CM | POA: Diagnosis not present

## 2021-01-14 LAB — RENAL FUNCTION PANEL
Albumin: 2.3 g/dL — ABNORMAL LOW (ref 3.5–5.0)
Albumin: 2.4 g/dL — ABNORMAL LOW (ref 3.5–5.0)
Anion gap: 9 (ref 5–15)
Anion gap: 17 — ABNORMAL HIGH (ref 5–15)
BUN: 77 mg/dL — ABNORMAL HIGH (ref 8–23)
BUN: 90 mg/dL — ABNORMAL HIGH (ref 8–23)
CO2: 24 mmol/L (ref 22–32)
CO2: 24 mmol/L (ref 22–32)
Calcium: 8.1 mg/dL — ABNORMAL LOW (ref 8.9–10.3)
Calcium: 7.9 mg/dL — ABNORMAL LOW (ref 8.9–10.3)
Chloride: 105 mmol/L (ref 98–111)
Chloride: 99 mmol/L (ref 98–111)
Creatinine, Ser: 2.86 mg/dL — ABNORMAL HIGH (ref 0.61–1.24)
Creatinine, Ser: 3.26 mg/dL — ABNORMAL HIGH (ref 0.61–1.24)
GFR, Estimated: 23 mL/min — ABNORMAL LOW (ref >60)
GFR, Estimated: 20 mL/min — ABNORMAL LOW (ref >60)
Glucose, Bld: 150 mg/dL — ABNORMAL HIGH (ref 70–99)
Glucose, Bld: 169 mg/dL — ABNORMAL HIGH (ref 70–99)
Phosphorus: 3.8 mg/dL (ref 2.5–4.6)
Phosphorus: 5.1 mg/dL — ABNORMAL HIGH (ref 2.5–4.6)
Potassium: 3.8 mmol/L (ref 3.5–5.1)
Potassium: 3.6 mmol/L (ref 3.5–5.1)
Sodium: 138 mmol/L (ref 135–145)
Sodium: 140 mmol/L (ref 135–145)

## 2021-01-14 LAB — POCT I-STAT 7, (LYTES, BLD GAS, ICA,H+H)
Acid-Base Excess: 3 mmol/L — ABNORMAL HIGH (ref 0.0–2.0)
Acid-Base Excess: 2 mmol/L (ref 0.0–2.0)
Bicarbonate: 25.8 mmol/L (ref 20.0–28.0)
Bicarbonate: 24.9 mmol/L (ref 20.0–28.0)
Calcium, Ion: 1.11 mmol/L — ABNORMAL LOW (ref 1.15–1.40)
Calcium, Ion: 1.12 mmol/L — ABNORMAL LOW (ref 1.15–1.40)
HCT: 22 % — ABNORMAL LOW (ref 39.0–52.0)
HCT: 20 % — ABNORMAL LOW (ref 39.0–52.0)
Hemoglobin: 7.5 g/dL — ABNORMAL LOW (ref 13.0–17.0)
Hemoglobin: 6.8 g/dL — CL (ref 13.0–17.0)
O2 Saturation: 97 %
O2 Saturation: 98 %
Patient temperature: 98.2
Patient temperature: 37.5
Potassium: 3.9 mmol/L (ref 3.5–5.1)
Potassium: 3.8 mmol/L (ref 3.5–5.1)
Sodium: 139 mmol/L (ref 135–145)
Sodium: 139 mmol/L (ref 135–145)
TCO2: 27 mmol/L (ref 22–32)
TCO2: 26 mmol/L (ref 22–32)
pCO2 arterial: 31.6 mmHg — ABNORMAL LOW (ref 32.0–48.0)
pCO2 arterial: 30.4 mmHg — ABNORMAL LOW (ref 32.0–48.0)
pH, Arterial: 7.519 — ABNORMAL HIGH (ref 7.350–7.450)
pH, Arterial: 7.522 — ABNORMAL HIGH (ref 7.350–7.450)
pO2, Arterial: 81 mmHg — ABNORMAL LOW (ref 83.0–108.0)
pO2, Arterial: 98 mmHg (ref 83.0–108.0)

## 2021-01-14 LAB — GLUCOSE, CAPILLARY
Glucose-Capillary: 124 mg/dL — ABNORMAL HIGH (ref 70–99)
Glucose-Capillary: 125 mg/dL — ABNORMAL HIGH (ref 70–99)
Glucose-Capillary: 140 mg/dL — ABNORMAL HIGH (ref 70–99)
Glucose-Capillary: 146 mg/dL — ABNORMAL HIGH (ref 70–99)
Glucose-Capillary: 148 mg/dL — ABNORMAL HIGH (ref 70–99)
Glucose-Capillary: 159 mg/dL — ABNORMAL HIGH (ref 70–99)
Glucose-Capillary: 167 mg/dL — ABNORMAL HIGH (ref 70–99)
Glucose-Capillary: 173 mg/dL — ABNORMAL HIGH (ref 70–99)

## 2021-01-14 LAB — TRIGLYCERIDES: Triglycerides: 61 mg/dL (ref <150)

## 2021-01-14 LAB — CBC
HCT: 23.5 % — ABNORMAL LOW (ref 39.0–52.0)
HCT: 23.6 % — ABNORMAL LOW (ref 39.0–52.0)
Hemoglobin: 7.5 g/dL — ABNORMAL LOW (ref 13.0–17.0)
Hemoglobin: 7.7 g/dL — ABNORMAL LOW (ref 13.0–17.0)
MCH: 29.9 pg (ref 26.0–34.0)
MCH: 30.2 pg (ref 26.0–34.0)
MCHC: 31.9 g/dL (ref 30.0–36.0)
MCHC: 32.6 g/dL (ref 30.0–36.0)
MCV: 93.6 fL (ref 80.0–100.0)
MCV: 92.5 fL (ref 80.0–100.0)
Platelets: 234 10*3/uL (ref 150–400)
Platelets: 251 10*3/uL (ref 150–400)
RBC: 2.51 MIL/uL — ABNORMAL LOW (ref 4.22–5.81)
RBC: 2.55 MIL/uL — ABNORMAL LOW (ref 4.22–5.81)
RDW: 14.9 % (ref 11.5–15.5)
RDW: 15 % (ref 11.5–15.5)
WBC: 17.9 10*3/uL — ABNORMAL HIGH (ref 4.0–10.5)
WBC: 17.9 10*3/uL — ABNORMAL HIGH (ref 4.0–10.5)
nRBC: 0.1 % (ref 0.0–0.2)
nRBC: 0 % (ref 0.0–0.2)

## 2021-01-14 LAB — PREPARE RBC (CROSSMATCH)

## 2021-01-14 LAB — COOXEMETRY PANEL
Carboxyhemoglobin: 1.3 % (ref 0.5–1.5)
Methemoglobin: 0.9 % (ref 0.0–1.5)
O2 Saturation: 69.6 %
Total hemoglobin: 7.2 g/dL — ABNORMAL LOW (ref 12.0–16.0)

## 2021-01-14 LAB — MAGNESIUM: Magnesium: 2.6 mg/dL — ABNORMAL HIGH (ref 1.7–2.4)

## 2021-01-14 LAB — APTT: aPTT: 36 seconds (ref 24–36)

## 2021-01-14 MED ORDER — FUROSEMIDE 10 MG/ML IJ SOLN
160.0000 mg | Freq: Two times a day (BID) | INTRAVENOUS | Status: AC
  Administered 2021-01-14 (×2): 160 mg via INTRAVENOUS
  Filled 2021-01-14: qty 6
  Filled 2021-01-14: qty 16

## 2021-01-14 MED ORDER — LEVETIRACETAM IN NACL 500 MG/100ML IV SOLN
500.0000 mg | Freq: Two times a day (BID) | INTRAVENOUS | Status: DC
  Administered 2021-01-14: 500 mg via INTRAVENOUS
  Filled 2021-01-14: qty 100

## 2021-01-14 MED ORDER — MODAFINIL 100 MG PO TABS: 100.0000 mg | ORAL_TABLET | Freq: Every day | ORAL | Status: DC

## 2021-01-14 MED ORDER — PANTOPRAZOLE SODIUM 40 MG IV SOLR
40.0000 mg | Freq: Two times a day (BID) | INTRAVENOUS | Status: DC
  Administered 2021-01-14 – 2021-01-16 (×6): 40 mg via INTRAVENOUS
  Filled 2021-01-14 (×6): qty 40

## 2021-01-14 MED ORDER — SODIUM CHLORIDE 0.9% IV SOLUTION: Freq: Once | INTRAVENOUS | Status: AC

## 2021-01-14 MED ORDER — MODAFINIL 100 MG PO TABS
100.0000 mg | ORAL_TABLET | Freq: Every day | ORAL | Status: DC
  Administered 2021-01-15 – 2021-01-23 (×9): 100 mg
  Filled 2021-01-14 (×10): qty 1

## 2021-01-14 NOTE — Progress Notes (Signed)
NAME:  [name], MRN:  [id number], DOB:  [birth year], LOS: 29 ADMISSION DATE:  01/03/2021, CONSULTATION DATE:  01/03/2021 REFERRING MD:  Dr. Haroldine Laws, CHIEF COMPLAINT:  STEMI/ cardiogenic shock  History of Present Illness:   70 year old male with PMHx HTN, HLD, CAD presenting via EMS with sudden onset of CP, found to have inferior STEMI with bradycardia and hypotension.  Taken emergently to Cath Lab on arrival.  Temporary pacer was placed with good capture; however, patient then developed profound hypotension decompensating into cardiac arrest, requiring intubation by anesthesia and CPR with episodes of VF requiring defibrillation and amiodarone. Patient was found to have total proximal RCA occlusion and underwent PCI to the RCA with stent placement.  Procedure was further c/b cardiogenic shock (requiring placement of Impella device) c/b bleeding secondary to cangrelor with development of bleeding and hematoma at puncture sites, Hgb dropped from 12.6 to 7.5 requiring emergent transfusion of 4U PRBCs and cangrelor discontinuation. Patient required vasopressors on initial presentation to ICU. Patient returned to ICU intubated and sedated in cardiogenic shock with TVP and Impella. PCCM consulted for vent management.   Pertinent  Medical History  HTN, HLD, CAD  Significant Hospital Events: Including procedures, antibiotic start and stop dates in addition to other pertinent events   . 4/14 Admitted Cards with inferior STEMI-> bradycardia s/p TVP, cardiogenic shock and cardiac arrest s/p impella with PCI/stent to RCA, complicated by bleeding s/p 4 units PRBC thought 2/2 cangrelor, pending coags.  Has R IJ PA cath, TVP R groin, impella L groin . 4/15 Remains on impella, weaning pressors and inotropes. Started diuresis due to significantly increased oxygen requirements. . 4/18 Concern for seizure-like activity, Neuro consult, LTM/EEG, CT Head NAICA/no evidence of edema. Transitioned to ceftriaxone  from cefepime. . 4/19 Remains on Impella, off pressors. Continuing diuresis with hopeful discontinuation of Impella, allowing for MRI 4/20-4/21. Continuing LTM EEG, given facial twitching/unclear neurologic status. . 4/20 >8L UOP/24H with Lasix gtt, Impella at P6, MRI Brain pending Impella removal . 4/24 CRRT stopped, reintubated  Interim History / Subjective:  Reintubated yesterday. Remains critically ill intubated on mechanical life support  Objective   Blood pressure (!) 148/52, pulse 80, temperature 99.3 F (37.4 C), temperature source Axillary, resp. rate (!) 27, height 6\' 1"  (1.854 m), weight 91.6 kg, SpO2 100 %. CVP:  [1 mmHg-13 mmHg] 11 mmHg  Vent Mode: CPAP;PSV FiO2 (%):  [40 %-100 %] 40 % Set Rate:  [10 bmp] 10 bmp Vt Set:  [640 mL] 640 mL PEEP:  [5 cmH20] 5 cmH20 Pressure Support:  [8 cmH20] 8 cmH20 Plateau Pressure:  [13 cmH20-16 cmH20] 16 cmH20   Intake/Output Summary (Last 24 hours) at 01/14/2021 9518 Last data filed at 01/14/2021 0800 Gross per 24 hour  Intake 2156.18 ml  Output 1242 ml  Net 914.18 ml   Filed Weights   01/12/21 0435 01/13/21 0359 01/14/21 0500  Weight: 96.9 kg 94.8 kg 91.6 kg    Physical Examination: Constitutional: Elderly gentleman intubated mechanical life support, critically ill Eyes: Reactive  HEENT: Endotracheal tube in place Cardiovascular: Regular rate rhythm S1-S2 Respiratory: Bilateral mechanically ventilated breath sounds Gastrointestinal: Soft, nontender nondistended Skin: No rash, dependent edema in the upper and lower extremities Neurologic: Win[place] and tries to withdraw to pain Psychiatric: RASS -2   Labs/imaging that I have personally reviewed: (right click and "Reselect all SmartList Selections" daily)    ABG: 7.5/30 point 4/98/20 4.9 Sodium 138 Serum creatinine 2.86 White blood cell count 17.9 Hemoglobin 8.6.  [facility]  Problem list   Hemorrhagic shock Lactic acidosis  Assessment & Plan:   Acute  inferior STEMI post PCI and stenting RCA, present on admission Cardiogenic shock, present on admission Ventricular fibrillation, cardiac arrest Acute systolic heart failure, present on admission  Volume overloaded state of heart   Plan: Heart failure regimen per heart failure service. Dual antiplatelet therapy aspirin plus Brilinta Continue Crestor Unable to give ACE due to AKI Follow coox Off pressors at this time  Hypervolemic AKI Plan: Holding on CVVHD at this time. Appreciate nephrology input. Follow urine output  Anoxic brain injury- having some improvement on exam so giving more time Acute bilateral strokes, multiple microembolic infarctions on MRI Initial EEG negative, Twitching- no EEG correlate so thought less likely seizures Plan: Remains on AEDs Appreciate neurology input.  Acute hypoxemic respiratory failure with encephalopathy being barrier to extubation  Possible HCAP- check tracheal aspirate Plan: PAD guidelines sedation Adult mechanical vent protocol. May consider a tracheostomy tube placement in future after further discussion with family Continue Zosyn, follow culture  Acute blood loss anemia, no obvious source of bleeding at this time. Plan: Transfuse x1 Continue to follow H&H.    Best practice (right click and "Reselect all SmartList Selections" daily)  Diet:  Tube Feed  Pain/Anxiety/Delirium protocol (if indicated): Yes (RASS goal 0) to -1 VAP protocol (if indicated): Yes DVT prophylaxis: Systemic AC GI prophylaxis: PPI Glucose control:  SSI Yes and Basal insulin Yes Central venous access:  Yes, and it is still needed Arterial line:  Yes, and it is still needed Foley:  Yes, and it is still needed Mobility:  bed rest  PT consulted: N/A Last date of multidisciplinary goals of care discussion daily with POA Code Status:  full code Disposition: ICU   This patient is critically ill with multiple organ system failure; which, requires frequent  high complexity decision making, assessment, support, evaluation, and titration of therapies. This was completed through the application of advanced monitoring technologies and extensive interpretation of multiple databases. During this encounter critical care time was devoted to patient care services described in this note for 33 minutes.  Garner Nash, DO [place] [facility] 01/14/2021 9:09 AM

## 2021-01-14 NOTE — Progress Notes (Addendum)
Patient ID: [name], male   DOB: [birth year], 70 y.o.   MRN: [id number]     Advanced Heart Failure Rounding Note  PCP-Cardiologist: No primary care provider on file.   Subjective:    4/20 Impella removed. MRI brain with numerous micro embolic infarctions. Off pressors.  4/21 lasix drip stopped.  4/22 started CVVH 4/24 CVVH stopped. Failed extubation. Re-intubated.   Remains intubated FiO2 40%.   hgb 8.5>7.5 >6.8. No obvious source.     Objective:   Weight Range: 91.6 kg Body mass index is 26.64 kg/m.   Vital Signs:   Temp:  [99.3 F (37.4 C)-100.1 F (37.8 C)] 99.3 F (37.4 C) (04/25 0747) Pulse Rate:  [58-102] 76 (04/25 0344) Resp:  [15-30] 20 (04/25 0344) BP: (130-142)/(47-123) 130/47 (04/25 0344) SpO2:  [90 %-100 %] 100 % (04/25 0344) Arterial Line BP: (118-202)/(45-80) 126/46 (04/25 0300) FiO2 (%):  [40 %-100 %] 40 % (04/25 0344) Weight:  [91.6 kg] 91.6 kg (04/25 0500) Last BM Date: 01/13/21  Weight change: Filed Weights   01/12/21 0435 01/13/21 0359 01/14/21 0500  Weight: 96.9 kg 94.8 kg 91.6 kg    Intake/Output:   Intake/Output Summary (Last 24 hours) at 01/14/2021 0815 Last data filed at 01/14/2021 0600 Gross per 24 hour  Intake 2104.14 ml  Output 1116 ml  Net 988.14 ml      Physical Exam  CVP 2-3  General:  Intubated.  HEENT: ETT Neck: supple. no JVD. Carotids 2+ bilat; no bruits. No lymphadenopathy or thryomegaly appreciated. RIJ  Cor: PMI nondisplaced. Regular rate & rhythm. No rubs, gallops or murmurs. Lungs: Coarse throughtout Abdomen: soft, nontender, nondistended. No hepatosplenomegaly. No bruits or masses. Good bowel sounds. Extremities: no cyanosis, clubbing, rash, edema Neuro: Intubated.   Telemetry  SR 70-80s   Labs    CBC Recent Labs    01/13/21 0430 01/13/21 1638 01/14/21 0450 01/14/21 0458  WBC 18.2*  --  17.9*  --   HGB 8.2*   < > 7.5* 6.8*  HCT 25.7*   < > 23.5* 20.0*  MCV 95.2  --  93.6  --   PLT 185  --   234  --    < > = values in this interval not displayed.   Basic Metabolic Panel Recent Labs    01/13/21 0430 01/13/21 1637 01/13/21 1638 01/14/21 0450 01/14/21 0458  NA 139  139 138   < > 138 139  K 4.2  4.2 3.8   < > 3.8 3.8  CL 105  104 104  --  105  --   CO2 _0 --  24  --   GLUCOSE 140*  140* 158*  --  150*  --   BUN 48*  47* 60*  --  77*  --   CREATININE 1.72*  1.69* 2.14*  --  2.86*  --   CALCIUM 8.2*  8.1* 8.3*  --  8.1*  --   MG 2.4  --   --  2.6*  --   PHOS 2.9  --   --  3.8  --    < > = values in this interval not displayed.   Liver Function Tests Recent Labs    01/13/21 0430 01/14/21 0450  ALBUMIN 2.4* 2.3*   No results for input(s): LIPASE, AMYLASE in the last 72 hours. Cardiac Enzymes Recent Labs    01/11/21 1216  CKTOTAL 316    BNP: BNP (last 3 results) No results for input(s): BNP  in the last 8760 hours.  ProBNP (last 3 results) No results for input(s): PROBNP in the last 8760 hours.   D-Dimer No results for input(s): DDIMER in the last 72 hours. Hemoglobin A1C No results for input(s): HGBA1C in the last 72 hours. Fasting Lipid Panel Recent Labs    01/14/21 0450  TRIG 61   Thyroid Function Tests No results for input(s): TSH, T4TOTAL, T3FREE, THYROIDAB in the last 72 hours.  Invalid input(s): FREET3  Other results:   Imaging    DG Chest 1 View  Result Date: 01/13/2021 CLINICAL DATA:  History of ET tube EXAM: CHEST  1 VIEW COMPARISON:  January 11, 2021 FINDINGS: The ETT terminates in good position. The left central line is stable terminating in the SVC. The right IJ sheath remains. The NG tube terminates below today's film. No pneumothorax. Opacity in the medial right lung base is stable to mildly worsened in the interval. Mild opacity in left base is more prominent the interval. No other acute abnormalities. IMPRESSION: 1. Bibasilar opacities are mildly worsened in the interval worrisome for pneumonia. Recommend attention  on follow-up. 2. Support apparatus as above. 3. No other acute abnormalities. Electronically Signed   By: Dorise Bullion III M.D   On: 01/13/2021 14:15   DG Abd 1 View  Result Date: 01/13/2021 CLINICAL DATA:  Evaluate OG tube EXAM: ABDOMEN - 1 VIEW COMPARISON:  January 09, 2021 FINDINGS: The OG tube is been pulled back in the interval. The distal tip is in the body of the stomach. The side port is near the GE junction. IMPRESSION: The NG tube is been pulled back in the interval the side port is now near the GE junction. Recommend advancing several cm. Electronically Signed   By: Dorise Bullion III M.D   On: 01/13/2021 14:15     Medications:     Scheduled Medications: . aspirin  81 mg Per Tube Daily  . chlorhexidine gluconate (MEDLINE KIT)  15 mL Mouth Rinse BID  . Chlorhexidine Gluconate Cloth  6 each Topical Daily  . feeding supplement (PROSource TF)  45 mL Per Tube TID  . insulin aspart  2-6 Units Subcutaneous Q4H  . ipratropium-albuterol  3 mL Nebulization Q4H  . mouth rinse  15 mL Mouth Rinse 10 times per day  . modafinil  100 mg Oral Daily  . pantoprazole sodium  40 mg Per Tube Daily  . polyethylene glycol  17 g Per Tube BID  . rosuvastatin  10 mg Per Tube Daily  . sodium chloride flush  10-40 mL Intracatheter Q12H  . sodium chloride flush  3 mL Intravenous Q12H  . ticagrelor  90 mg Per Tube BID    Infusions: .  prismasol BGK 4/2.5 Stopped (01/13/21 1030)  .  prismasol BGK 4/2.5 Stopped (01/13/21 1030)  . sodium chloride 250 mL (01/11/21 1007)  . dexmedetomidine (PRECEDEX) IV infusion Stopped (01/13/21 1440)  . dextrose Stopped (01/13/21 1900)  . feeding supplement (VITAL 1.5 CAL) Stopped (01/13/21 1100)  . ferric gluconate (FERRLECIT/NULECIT) IV Stopped (01/13/21 1746)  . heparin 10,000 units/ 20 mL infusion syringe 500 Units/hr (01/12/21 1836)  . levETIRAcetam Stopped (01/13/21 2221)  . piperacillin-tazobactam (ZOSYN)  IV 12.5 mL/hr at 01/14/21 0600  . prismasol BGK  4/2.5 Stopped (01/13/21 1030)  . propofol (DIPRIVAN) infusion 10 mcg/kg/min (01/14/21 0600)    PRN Medications: sodium chloride, sodium chloride, acetaminophen (TYLENOL) oral liquid 160 mg/5 mL, dextrose, fentaNYL (SUBLIMAZE) injection, heparin, LORazepam, ondansetron (ZOFRAN) IV, sodium chloride, sodium chloride  flush, sodium chloride flush    Assessment/Plan   1. CAD/ Acute Inferior STEMI - emergent cath w/ pRCA occlusion s/p PCI + DES - Initial angiography showed sluggish flow in LAD and LCX>>improved post impella placement  - Echo LVEF 20-25%, RV moderately reduced - DAPT w/ ASA + Brilinta . Hgb down 6.8  No obvious bleeding.  - Crestor 10 per tube   2. VF Arrest - in setting of acute MI, now s/p revascularization  - no further ectopy on tele. Amio stopped - keep K > 4.0 and Mg > 2.0  - EEG with diffuse encephalopathy but no seizure activity. - MRI brain micro-emboli.    3. Shock, Combination Cardiogenic + Hemorrhagic  - Acute MI + developed severe hemorrhagic shock due to bleeding at left groin Impella Transfused 4U PRBCs . - Impella out 4/20  - Hgb dow to 6.8 today.  - Check CO-OX.  - Off pressors.    4. Acute Systolic Heart Failure>>Cardiogenic Shock  - ICM Echo LVEF 20-25%, RV moderately reduced - Impella out 4/20. Off all pressors.  - Volume status improved. CVP 2-3. - CVVH stopped 4/24. Making ~1 liter  - No bb with shock.  - No spiro/arni/dig with AKI.   5. Acute hypoxic respiratory failure - now on 40% FIO2 - 4/24 failed extubation trial.   5. Nonoliguric AKI - due to ATN/shock - Creatinine up to 5 with concern for uremia contributing to neurologic dysfunction, so CVVH begun 4/22 and stopped 4/24  -CVP 2-3, Making >1 liter urine.   6. Hypokalemia/hyperkalemia  7. Leukocytosis -WBC 18.  - Check cultures.   8. DM2 - off insulin gtt - now on levimir and SSI  9. Encephalopathy -->uremia, acute embolic strokes.  - post cardiac arrest - head CT  negative - EEG w/ generalized periodic discharges with triphasic morphology + moderate to severe diffuse encephalopathy.  - ? cefepime toxicity +/- anoxic /hypoxic brain injury. Now off cefepime - neurology following   - MRI- numerous micro embolic infarctions . No hemorrhage or mass.  - On keppra - CVVH begun 4/22 for ?uremia contribution to encephalopathy. Stopped 4/24.   10. Anemia  -Receiving iron.  - Hgb 6.8  -No obvious source.  - Give 1UPRBC    Length of Stay: [place], NP  8:15 AM   Agree with above   Extubated and quickly re-intubated yesterday as was unable to handle secretions.   While extubated was awake and following commands. Agreed to trach.   Off CRRT. Creatinine up. Hgb down to 6.8.   General:  Sedated on vent HEENT: normal + ETT Neck: supple JVP up + HD cath. Carotids 2+ bilat; no bruits. No lymphadenopathy or thryomegaly appreciated. Cor: PMI nondisplaced. Regular rate & rhythm. No rubs, gallops or murmurs. Lungs: clear Abdomen: soft, nontender, nondistended. No hepatosplenomegaly. No bruits or masses. Good bowel sounds. Extremities: no cyanosis, clubbing, rash, 1+ edema L groin ecchymosis Neuro: intubated sedated  Remains very tenuous. Failed extubation yesterday. Now re-intubated. Was awake and following commands when extubated. Agreed to trach.   Creatinine back up.   Will d/w CCM. Plan trach later this week. Holding CRT today and await renal recovery. Transfuse 1u RBC.  CRITICAL CARE Performed by: Glori Bickers  Total critical care time: 35 minutes  Critical care time was exclusive of separately billable procedures and treating other patients.  Critical care was necessary to treat or prevent imminent or life-threatening deterioration.  Critical care was time spent personally  by me (independent of midlevel providers or residents) on the following activities: development of treatment plan with patient and/or surrogate as well as  nursing, discussions with consultants, evaluation of patient's response to treatment, examination of patient, obtaining history from patient or surrogate, ordering and performing treatments and interventions, ordering and review of laboratory studies, ordering and review of radiographic studies, pulse oximetry and re-evaluation of patient's condition.  Glori Bickers, MD  10:09 AM

## 2021-01-14 NOTE — Progress Notes (Signed)
[facility] notified of intermittent Atrial Fibrillation, not sustained. EKG strip in patient's chart (did not go through the server because patient converted back to NSR before info could be put into EKG). Remains mostly in NSR with PACs.

## 2021-01-14 NOTE — Progress Notes (Signed)
[facility] NEPHROLOGY PROGRESS NOTE  Assessment/ Plan: Pt is a 70 y.o. yo male with history of HTN, HLD, CAD presented with chest pain found to have STEMI.  He underwent left heart cath, placement of Impella device, had V. fib arrest, mechanical ventilation for respiratory failure and AKI.  #Acute kidney injury due to cardiogenic/hemorrhagic shock.  UA bland and kidney ultrasound unremarkable.  Required CRRT from 4/22-4/24.  Urine output around 900 cc and volume status acceptable.  No plan for dialysis today and watch for renal recovery.  Continue to keep temporary HD catheter.  #CAD/acute STEMI required emergent cath with stent placement.  EF 20 to 25%.  Cardiology following.  #V. fib arrest/cardiogenic shock, acute systolic CHF: Arrest in the setting of acute MI.  Impella was removed.  Now off of pressors or inotropes.  Volume status initially managed with CVVHD now off.  #Ventilator dependent respiratory failure: On mechanical ventilation.  Managed by PCCM.  #Anemia due to acute blood loss: Required multiple blood transfusion.  Transfuse as needed.  Subjective: Seen and examined.  CRRT stopped yesterday.  Urine output around 890 cc in 24 hours.  Remains on vent.  Not on pressors.  Discussed with ICU nurse. Objective Vital signs in last 24 hours: Vitals:   01/14/21 0300 01/14/21 0344 01/14/21 0445 01/14/21 0500  BP:  (!) 130/47    Pulse: 75 76    Resp: 15 20    Temp:   99.6 F (37.6 C)   TempSrc:   Oral   SpO2: 100% 100%    Weight:    91.6 kg  Height:       Weight change: -3.2 kg  Intake/Output Summary (Last 24 hours) at 01/14/2021 0745 Last data filed at 01/14/2021 0600 Gross per 24 hour  Intake 2191.42 ml  Output 1200 ml  Net 991.42 ml       Labs: Basic Metabolic Panel: Recent Labs  Lab 01/12/21 1557 01/12/21 1604 01/13/21 0430 01/13/21 1637 01/13/21 1638 01/14/21 0354 01/14/21 0450 01/14/21 0458  NA 141   < > 139  139 138   < > 139 138 139   K 4.4   < > 4.2  4.2 3.8   < > 3.9 3.8 3.8  CL 105  --  105  104 104  --   --  105  --   CO2 25  --  _0 --   --  24  --   GLUCOSE 128*  --  140*  140* 158*  --   --  150*  --   BUN 55*  --  48*  47* 60*  --   --  77*  --   CREATININE 2.11*  --  1.72*  1.69* 2.14*  --   --  2.86*  --   CALCIUM 8.1*  --  8.2*  8.1* 8.3*  --   --  8.1*  --   PHOS 3.3  --  2.9  --   --   --  3.8  --    < > = values in this interval not displayed.   Liver Function Tests: Recent Labs  Lab 01/08/21 0341 01/10/21 0429 01/11/21 1532 01/12/21 1557 01/13/21 0430 01/14/21 0450  AST 34 39  --   --   --   --   ALT 28 32  --   --   --   --   ALKPHOS 104 154*  --   --   --   --  BILITOT 1.0 0.9  --   --   --   --   PROT 5.5* 5.1*  --   --   --   --   ALBUMIN 1.9* 1.7*   < > 2.5* 2.4* 2.3*   < > = values in this interval not displayed.   No results for input(s): LIPASE, AMYLASE in the last 168 hours. No results for input(s): AMMONIA in the last 168 hours. CBC: Recent Labs  Lab 01/11/21 0405 01/11/21 0411 01/11/21 1216 01/12/21 0317 01/12/21 0510 01/12/21 5188 01/13/21 0430 01/13/21 1638 01/14/21 0354 01/14/21 0450 01/14/21 0458  WBC 6.5  --  7.1  --  10.3  --  18.2*  --   --  17.9*  --   HGB 7.8*   < > 7.6*   < > 8.3*   < > 8.2*   < > 7.5* 7.5* 6.8*  HCT 24.5*   < > 24.0*   < > 26.5*   < > 25.7*   < > 22.0* 23.5* 20.0*  MCV 93.5  --  93.0  --  95.0  --  95.2  --   --  93.6  --   PLT 101*  --  114*  --  155  --  185  --   --  234  --    < > = values in this interval not displayed.   Cardiac Enzymes: Recent Labs  Lab 01/11/21 1216  CKTOTAL 316   CBG: Recent Labs  Lab 01/13/21 1217 01/13/21 1637 01/13/21 2051 01/14/21 0037 01/14/21 0455  GLUCAP 128* 158* 167* 146* 148*    Iron Studies:  Recent Labs    01/11/21 1216  IRON 20*  TIBC 186*  FERRITIN 199   Studies/Results: DG Chest 1 View  Result Date: 01/13/2021 CLINICAL DATA:  History of ET tube EXAM: CHEST  1  VIEW COMPARISON:  January 11, 2021 FINDINGS: The ETT terminates in good position. The left central line is stable terminating in the SVC. The right IJ sheath remains. The NG tube terminates below today's film. No pneumothorax. Opacity in the medial right lung base is stable to mildly worsened in the interval. Mild opacity in left base is more prominent the interval. No other acute abnormalities. IMPRESSION: 1. Bibasilar opacities are mildly worsened in the interval worrisome for pneumonia. Recommend attention on follow-up. 2. Support apparatus as above. 3. No other acute abnormalities. Electronically Signed   By: Dorise Bullion III M.D   On: 01/13/2021 14:15   DG Abd 1 View  Result Date: 01/13/2021 CLINICAL DATA:  Evaluate OG tube EXAM: ABDOMEN - 1 VIEW COMPARISON:  January 09, 2021 FINDINGS: The OG tube is been pulled back in the interval. The distal tip is in the body of the stomach. The side port is near the GE junction. IMPRESSION: The NG tube is been pulled back in the interval the side port is now near the GE junction. Recommend advancing several cm. Electronically Signed   By: Dorise Bullion III M.D   On: 01/13/2021 14:15   Overnight EEG with video  Result Date: 01/12/2021 Lora Havens, MD     01/12/2021  3:34 PM Patient Name:[name] CZY:[id number] Epilepsy Attending:Priyanka Barbra Sarks Referring Physician/Provider:Dr. Zeb Comfort Duration:01/11/2021 [phone] to 01/12/2021 0933  Patient history:70 year old male with seizure-like episode. EEG to evaluate forseizures.  Level of alertness:Lethargic  AEDs during EEG study:LEV  Technical aspects: This EEG study was done with scalp electrodes positioned according to the 10-20 International system  of electrode placement. Electrical activity was acquired at a sampling rate of _0  and reviewed with a high frequency filter of _1  and a low frequency filter of _2 . EEG data were recorded continuously and digitally stored.   Description:EEG showed continuous generalized predominantly 5 to 7 Hz theta as well as intermittent 2-_3  delta slowing. Event button was pressed on 01/11/2021 at [place] and [address] for unclear reasons.Concomitant EEG before, during and after the event didn't show any eeg change to suggest seizure. Hyperventilation and photic stimulation were not performed.   ABNORMALITY - Continuous slow, generalized  IMPRESSION: This studyis suggestive of moderate diffuse encephalopathy, nonspecific etiology.  Event button was pressed on 01/11/2021 at [place] and [address] for unclear reasons without concomitant EEG change and were most likely NOT epileptic.   Priyanka Barbra Sarks   Medications: Infusions: .  prismasol BGK 4/2.5 Stopped (01/13/21 1030)  .  prismasol BGK 4/2.5 Stopped (01/13/21 1030)  . sodium chloride 250 mL (01/11/21 1007)  . dexmedetomidine (PRECEDEX) IV infusion Stopped (01/13/21 1440)  . dextrose Stopped (01/13/21 1900)  . feeding supplement (VITAL 1.5 CAL) Stopped (01/13/21 1100)  . ferric gluconate (FERRLECIT/NULECIT) IV Stopped (01/13/21 1746)  . heparin 10,000 units/ 20 mL infusion syringe 500 Units/hr (01/12/21 1836)  . levETIRAcetam Stopped (01/13/21 2221)  . piperacillin-tazobactam (ZOSYN)  IV 12.5 mL/hr at 01/14/21 0600  . prismasol BGK 4/2.5 Stopped (01/13/21 1030)  . propofol (DIPRIVAN) infusion 10 mcg/kg/min (01/14/21 0600)    Scheduled Medications: . aspirin  81 mg Per Tube Daily  . chlorhexidine gluconate (MEDLINE KIT)  15 mL Mouth Rinse BID  . Chlorhexidine Gluconate Cloth  6 each Topical Daily  . feeding supplement (PROSource TF)  45 mL Per Tube TID  . insulin aspart  2-6 Units Subcutaneous Q4H  . ipratropium-albuterol  3 mL Nebulization Q4H  . mouth rinse  15 mL Mouth Rinse 10 times per day  . modafinil  100 mg Oral Daily  . pantoprazole sodium  40 mg Per Tube Daily  . polyethylene glycol  17 g Per Tube BID  . rosuvastatin  10 mg Per Tube Daily  . sodium chloride flush   10-40 mL Intracatheter Q12H  . sodium chloride flush  3 mL Intravenous Q12H  . ticagrelor  90 mg Per Tube BID    have reviewed scheduled and prn medications.  Physical Exam: General: Sedated, intubated Heart:RRR, s1s2 nl Lungs: Coarse breath sound bilateral Abdomen:soft,  non-distended Extremities:No edema Dialysis Access: L IJ temporary HD catheter  Jeremy Ditullio Prasad Cardin Nitschke 01/14/2021,7:45 AM  LOS: 11 days

## 2021-01-14 NOTE — Progress Notes (Signed)
Subjective: No acute events overnight.  Two of patient's friends at bedside  ROS: Unable to obtain due to poor mental status  Examination  Vital signs in last 24 hours: Temp:  [98.7 F (37.1 C)-100.1 F (37.8 C)] 98.7 F (37.1 C) (04/25 1105) Pulse Rate:  [73-102] 78 (04/25 1100) Resp:  [15-30] 21 (04/25 1100) BP: (129-148)/(47-123) 129/54 (04/25 1000) SpO2:  [91 %-100 %] 100 % (04/25 1100) Arterial Line BP: (117-202)/(43-80) 117/43 (04/25 1100) FiO2 (%):  [40 %-100 %] 40 % (04/25 1134) Weight:  [91.6 kg] 91.6 kg (04/25 0500)  General: lying in bed,not in apparent distress CVS: pulse-normal rate and rhythm WU:[id number], coarse breath sounds bilaterally Extremities: normal,warm Neuro:Opens eyes after repeated tactile stimulation only, does not follow commands for me but per RN he was able to nod appropriately yes or no earlier, PERRLA, corneal reflex intact, gag reflex intact,withdraws to noxious Upper extremities  Basic Metabolic Panel: Recent Labs  Lab 01/09/21 0700 01/09/21 2016 01/10/21 0429 01/10/21 0609 01/11/21 1532 01/12/21 0317 01/12/21 0510 01/12/21 2956 01/12/21 1557 01/12/21 1604 01/13/21 0430 01/13/21 1637 01/13/21 1638 01/14/21 0354 01/14/21 0450 01/14/21 0458  NA 142   < > 144   < > 146*   < > 141   < > 141   < > 139  139 138 140 139 138 139  K 3.2*   < > 3.7   < > 3.5   < > 3.7   < > 4.4   < > 4.2  4.2 3.8 3.8 3.9 3.8 3.8  CL 100   < > 101   < > 106  --  104  --  105  --  105  104 104  --   --  105  --   CO2 30   < > 29   < > 29  --  29  --  25  --  28  28 23   --   --  24  --   GLUCOSE 168*   < > 174*   < > 151*  --  147*  --  128*  --  140*  140* 158*  --   --  150*  --   BUN 66*   < > 89*   < > 101*  --  73*  --  55*  --  48*  47* 60*  --   --  77*  --   CREATININE 4.08*   < > 4.71*   < > 4.20*  --  2.99*  --  2.11*  --  1.72*  1.69* 2.14*  --   --  2.86*  --   CALCIUM 7.8*   < > 7.8*   < > 8.0*  --  8.1*  --  8.1*  --  8.2*  8.1* 8.3*   --   --  8.1*  --   MG 1.9  --  2.5*  --   --   --  2.5*  --   --   --  2.4  --   --   --  2.6*  --   PHOS  --   --   --   --  4.6  --  2.9  --  3.3  --  2.9  --   --   --  3.8  --    < > = values in this interval not displayed.    CBC: Recent Labs  Lab 01/11/21 0405 01/11/21 0411 01/11/21 1216 01/12/21  6629 01/12/21 0510 01/12/21 4765 01/13/21 0430 01/13/21 1638 01/14/21 0354 01/14/21 0450 01/14/21 0458  WBC 6.5  --  7.1  --  10.3  --  18.2*  --   --  17.9*  --   HGB 7.8*   < > 7.6*   < > 8.3*   < > 8.2* 8.5* 7.5* 7.5* 6.8*  HCT 24.5*   < > 24.0*   < > 26.5*   < > 25.7* 25.0* 22.0* 23.5* 20.0*  MCV 93.5  --  93.0  --  95.0  --  95.2  --   --  93.6  --   PLT 101*  --  114*  --  155  --  185  --   --  234  --    < > = values in this interval not displayed.     Coagulation Studies: No results for input(s): LABPROT, INR in the last 72 hours.  Imaging No new brain imaging overnight  ASSESSMENT AND PLAN: 70 year old male presented with chest painwho initially presented on 4/14/2022with chest pain and was found to have STEMI, s/p LHC andplacement of TVP for bradycardia, placement of impella, and stenting of RCA.Postop course was complicated by V. fib arrest and hemorrhagic shock at the site of Impella insertion. Today patient will need to have twitching of mouth.  Seizure-like episode Acute embolic strokes Acute encephalopathy AKI -Encephalopathy likely secondary touremia, sedation. -Multiple punctate ischemic strokes likely cardioembolic due to left heart cath.  Recommendations: -As EEG did not show any seizures, will reduceKeppra 500 mg twice daily.  This will also likely help minimize sedation -Continue seizure precautions -As needed IV Ativan 2 mg for clinical seizure-like activity lasting more than 5 minutes. Please call neuro if needed. -Updated about current neurologic status and discussed plan with patient's friend at bedside  I have spent a total  of82minuteswith the patient reviewing hospitalnotes, test results, labs and examining the patient as well as establishing an assessment and plan.>50% of time was spent in direct patient care.   Zeb Comfort Epilepsy [facility] For questions after 5pm please refer to AMION to reach the Neurologist on call

## 2021-01-14 NOTE — Progress Notes (Signed)
[facility] Progress Note Patient Name: [name] Class DOB: July 24, 1951 MRN: [id number]   Date of Service  01/14/2021  HPI/Events of Note  Bedside RN reporting paroxysmal afib which has so far terminated spontaneously, patient is currently in sinus rhythm.  eICU Interventions  Observe patient for now, treat PAF with Amiodarone + Heparin,  Trend Troponin, replete electrolytes.         Kerry Kass Yu Cragun 01/14/2021, 11:55 PM

## 2021-01-15 DIAGNOSIS — Z978 Presence of other specified devices: Secondary | ICD-10-CM | POA: Diagnosis not present

## 2021-01-15 DIAGNOSIS — R570 Cardiogenic shock: Secondary | ICD-10-CM | POA: Diagnosis not present

## 2021-01-15 DIAGNOSIS — R569 Unspecified convulsions: Secondary | ICD-10-CM | POA: Diagnosis not present

## 2021-01-15 LAB — POCT I-STAT 7, (LYTES, BLD GAS, ICA,H+H)
Acid-Base Excess: 3 mmol/L — ABNORMAL HIGH (ref 0.0–2.0)
Bicarbonate: 26.4 mmol/L (ref 20.0–28.0)
Calcium, Ion: 1.09 mmol/L — ABNORMAL LOW (ref 1.15–1.40)
HCT: 24 % — ABNORMAL LOW (ref 39.0–52.0)
Hemoglobin: 8.2 g/dL — ABNORMAL LOW (ref 13.0–17.0)
O2 Saturation: 99 %
Patient temperature: 99.8
Potassium: 3.2 mmol/L — ABNORMAL LOW (ref 3.5–5.1)
Sodium: 140 mmol/L (ref 135–145)
TCO2: 27 mmol/L (ref 22–32)
pCO2 arterial: 34.1 mmHg (ref 32.0–48.0)
pH, Arterial: 7.499 — ABNORMAL HIGH (ref 7.350–7.450)
pO2, Arterial: 126 mmHg — ABNORMAL HIGH (ref 83.0–108.0)

## 2021-01-15 LAB — COOXEMETRY PANEL
Carboxyhemoglobin: 1.3 % (ref 0.5–1.5)
Methemoglobin: 0.8 % (ref 0.0–1.5)
O2 Saturation: 70.1 %
Total hemoglobin: 8.8 g/dL — ABNORMAL LOW (ref 12.0–16.0)

## 2021-01-15 LAB — CBC
HCT: 26.5 % — ABNORMAL LOW (ref 39.0–52.0)
Hemoglobin: 8.7 g/dL — ABNORMAL LOW (ref 13.0–17.0)
MCH: 29.5 pg (ref 26.0–34.0)
MCHC: 32.8 g/dL (ref 30.0–36.0)
MCV: 89.8 fL (ref 80.0–100.0)
Platelets: 272 10*3/uL (ref 150–400)
RBC: 2.95 MIL/uL — ABNORMAL LOW (ref 4.22–5.81)
RDW: 16.6 % — ABNORMAL HIGH (ref 11.5–15.5)
WBC: 16.5 10*3/uL — ABNORMAL HIGH (ref 4.0–10.5)
nRBC: 0.1 % (ref 0.0–0.2)

## 2021-01-15 LAB — RENAL FUNCTION PANEL
Albumin: 2.3 g/dL — ABNORMAL LOW (ref 3.5–5.0)
Anion gap: 14 (ref 5–15)
BUN: 105 mg/dL — ABNORMAL HIGH (ref 8–23)
CO2: 24 mmol/L (ref 22–32)
Calcium: 8.1 mg/dL — ABNORMAL LOW (ref 8.9–10.3)
Chloride: 101 mmol/L (ref 98–111)
Creatinine, Ser: 3.5 mg/dL — ABNORMAL HIGH (ref 0.61–1.24)
GFR, Estimated: 18 mL/min — ABNORMAL LOW (ref >60)
Glucose, Bld: 156 mg/dL — ABNORMAL HIGH (ref 70–99)
Phosphorus: 6.4 mg/dL — ABNORMAL HIGH (ref 2.5–4.6)
Potassium: 3.3 mmol/L — ABNORMAL LOW (ref 3.5–5.1)
Sodium: 139 mmol/L (ref 135–145)

## 2021-01-15 LAB — BASIC METABOLIC PANEL
Anion gap: 14 (ref 5–15)
BUN: 110 mg/dL — ABNORMAL HIGH (ref 8–23)
CO2: 25 mmol/L (ref 22–32)
Calcium: 8.2 mg/dL — ABNORMAL LOW (ref 8.9–10.3)
Chloride: 103 mmol/L (ref 98–111)
Creatinine, Ser: 3.66 mg/dL — ABNORMAL HIGH (ref 0.61–1.24)
GFR, Estimated: 17 mL/min — ABNORMAL LOW (ref >60)
Glucose, Bld: 130 mg/dL — ABNORMAL HIGH (ref 70–99)
Potassium: 3.4 mmol/L — ABNORMAL LOW (ref 3.5–5.1)
Sodium: 142 mmol/L (ref 135–145)

## 2021-01-15 LAB — GLUCOSE, CAPILLARY
Glucose-Capillary: 108 mg/dL — ABNORMAL HIGH (ref 70–99)
Glucose-Capillary: 128 mg/dL — ABNORMAL HIGH (ref 70–99)
Glucose-Capillary: 128 mg/dL — ABNORMAL HIGH (ref 70–99)
Glucose-Capillary: 128 mg/dL — ABNORMAL HIGH (ref 70–99)
Glucose-Capillary: 140 mg/dL — ABNORMAL HIGH (ref 70–99)
Glucose-Capillary: 145 mg/dL — ABNORMAL HIGH (ref 70–99)
Glucose-Capillary: 156 mg/dL — ABNORMAL HIGH (ref 70–99)

## 2021-01-15 LAB — TYPE AND SCREEN
ABO/RH(D): AB POS
Antibody Screen: NEGATIVE
Unit division: 0

## 2021-01-15 LAB — CULTURE, RESPIRATORY W GRAM STAIN: Culture: NORMAL

## 2021-01-15 LAB — MAGNESIUM: Magnesium: 2.6 mg/dL — ABNORMAL HIGH (ref 1.7–2.4)

## 2021-01-15 LAB — BPAM RBC
Blood Product Expiration Date: 202205252359
ISSUE DATE / TIME: 202204251712
Unit Type and Rh: 8400

## 2021-01-15 MED ORDER — HEPARIN SODIUM (PORCINE) 5000 UNIT/ML IJ SOLN
5000.0000 [IU] | Freq: Three times a day (TID) | INTRAMUSCULAR | Status: DC
  Administered 2021-01-15 – 2021-01-19 (×15): 5000 [IU] via SUBCUTANEOUS
  Filled 2021-01-15 (×14): qty 1

## 2021-01-15 MED ORDER — POTASSIUM CHLORIDE 20 MEQ PO PACK
40.0000 meq | PACK | Freq: Once | ORAL | Status: AC
  Administered 2021-01-15: 40 meq
  Filled 2021-01-15: qty 2

## 2021-01-15 MED ORDER — NUTRISOURCE FIBER PO PACK
1.0000 | PACK | Freq: Two times a day (BID) | ORAL | Status: DC
  Administered 2021-01-15 – 2021-01-23 (×15): 1
  Filled 2021-01-15 (×19): qty 1

## 2021-01-15 MED ORDER — CARVEDILOL 3.125 MG PO TABS: 3.1250 mg | ORAL_TABLET | Freq: Two times a day (BID) | ORAL | Status: DC

## 2021-01-15 MED ORDER — POTASSIUM CHLORIDE CRYS ER 20 MEQ PO TBCR: 40.0000 meq | EXTENDED_RELEASE_TABLET | Freq: Once | ORAL | Status: DC

## 2021-01-15 MED ORDER — POTASSIUM CHLORIDE 10 MEQ/100ML IV SOLN
10.0000 meq | INTRAVENOUS | Status: AC
  Administered 2021-01-15 (×2): 10 meq via INTRAVENOUS
  Filled 2021-01-15: qty 100

## 2021-01-15 MED ORDER — ORAL CARE MOUTH RINSE
15.0000 mL | Freq: Two times a day (BID) | OROMUCOSAL | Status: DC
  Administered 2021-01-16 – 2021-01-20 (×7): 15 mL via OROMUCOSAL

## 2021-01-15 NOTE — Plan of Care (Signed)
  Problem: Education: Goal: Knowledge of General Education information will improve Description: Including pain rating scale, medication(s)/side effects and non-pharmacologic comfort measures Outcome: Progressing   Problem: Health Behavior/Discharge Planning: Goal: Ability to manage health-related needs will improve Outcome: Progressing   Problem: Clinical Measurements: Goal: Ability to maintain clinical measurements within normal limits will improve Outcome: Progressing Goal: Will remain free from infection Outcome: Progressing Goal: Diagnostic test results will improve Outcome: Progressing Goal: Respiratory complications will improve Outcome: Progressing Goal: Cardiovascular complication will be avoided Outcome: Progressing   Problem: Activity: Goal: Risk for activity intolerance will decrease Outcome: Progressing   Problem: Nutrition: Goal: Adequate nutrition will be maintained Outcome: Progressing   Problem: Coping: Goal: Level of anxiety will decrease Outcome: Progressing   Problem: Elimination: Goal: Will not experience complications related to bowel motility Outcome: Progressing Goal: Will not experience complications related to urinary retention Outcome: Progressing   Problem: Pain Managment: Goal: General experience of comfort will improve Outcome: Progressing   Problem: Safety: Goal: Ability to remain free from injury will improve Outcome: Progressing   Problem: Skin Integrity: Goal: Risk for impaired skin integrity will decrease Outcome: Progressing   Problem: Education: Goal: Understanding of cardiac disease, CV risk reduction, and recovery process will improve Outcome: Progressing Goal: Understanding of medication regimen will improve Outcome: Progressing Goal: Individualized Educational Video(s) Outcome: Progressing   Problem: Activity: Goal: Ability to tolerate increased activity will improve Outcome: Progressing   Problem: Cardiac: Goal:  Ability to achieve and maintain adequate cardiopulmonary perfusion will improve Outcome: Progressing Goal: Vascular access site(s) Level 0-1 will be maintained Outcome: Progressing   Problem: Health Behavior/Discharge Planning: Goal: Ability to safely manage health-related needs after discharge will improve Outcome: Progressing   Problem: Activity: Goal: Ability to tolerate increased activity will improve Outcome: Progressing   Problem: Respiratory: Goal: Ability to maintain a clear airway and adequate ventilation will improve Outcome: Progressing

## 2021-01-15 NOTE — Procedures (Signed)
Extubation Procedure Note  Patient Details:   Name: [name] DOB: June 21, 1951 MRN: [id number]   Airway Documentation:    Vent end date: 01/15/21 Vent end time: 1055   Evaluation  O2 sats: stable throughout Complications: No apparent complications Patient did tolerate procedure well. Bilateral Breath Sounds: Clear,Diminished   Yes   Pt extubated to 3L [place]. No stridor heard, positive cuff leak noted. Vitals stable throughout. RT will continue to monitor.   Tobi Bastos 01/15/2021, 11:01 AM

## 2021-01-15 NOTE — Evaluation (Signed)
Physical Therapy Evaluation Patient Details Name: [name] MRN: [id number] DOB: [birth year] Today's Date: 01/15/2021   History of Present Illness  Pt is 70 yo admitted 4/14 with chest pain and STEMI with bradycardia and hypotension. Pt taken for urgent cath with temporary pacer placed but pt decompensated to cardiac arrest with intubation and CPR. Stent placed and pt with further complication of cardiogenic shock with Impella placed. 4/18 seizure like activity, Impella removed 4/20, 4/20 MRI with scattered micro embolic infarctions. Extubated 4/24 and reintubated 2 hrs later. Extubated 4/26. PMHx: HTN, HLD, CAD    Clinical Impression  Pt admitted with above diagnosis. Limited evaluation due to complicated medical admission hx, pt just extubated today, and need assist of 2 to progress and fatigue.  Pt requiring max A for bed mobility.  Pt with weakness throughout - worse on R than L.  His VSS throughout session on 2 L of O2.  Pt required increased cues and time to respond.  At baseline, pt lives alone, independent, and walks his dog 2 miles daily.  Pt does have limited support at home, but did recommend CIR due to high PLOF and hopeful that pt will progress well with intense therapy.  Pt currently with functional limitations due to the deficits listed below (see PT Problem List). Pt will benefit from skilled PT to increase their independence and safety with mobility to allow discharge to the venue listed below.       Follow Up Recommendations CIR    Equipment Recommendations  Wheelchair cushion (measurements PT);3in1 (PT);Wheelchair (measurements PT);Hospital bed (hoyer; needs further assessment)    Recommendations for Other Services Rehab consult     Precautions / Restrictions Precautions Precautions: Fall      Mobility  Bed Mobility Overal bed mobility: Needs Assistance Bed Mobility: Rolling Rolling: Max assist         General bed mobility comments: Max A to roll both  sides: facilitated with cues to reach and flexed opposite leg, rolling L harder than R.  Pt did not have strength to sit EOB with assist of 1 today.  Will need +2 to progress.  Also, fatigued easily today.    Transfers                 General transfer comment: unable to attempt  Ambulation/Gait                Stairs            Wheelchair Mobility    Modified Rankin (Stroke Patients Only) Modified Rankin (Stroke Patients Only) Pre-Morbid Rankin Score: No symptoms Modified Rankin: Severe disability     Balance Overall balance assessment: Needs assistance   Sitting balance-Leahy Scale: Zero Sitting balance - Comments: Did work on leaning up to long sit position from [facility] elevated - used L rail and max A to accomplish partial long sit                                     Pertinent Vitals/Pain Pain Assessment: Faces Faces Pain Scale: Hurts whole lot Pain Location: L groin - during ADLs Pain Descriptors / Indicators: Moaning;Grimacing Pain Intervention(s): Limited activity within patient's tolerance;Monitored during session    Home Living Family/patient expects to be discharged to:: Unsure Living Arrangements: Alone Available Help at Discharge: Family;Friend(s);Available PRN/intermittently (limited assistance available per POA Jeff) Type of Home: House Home Access: Level entry  Home Equipment: None Additional Comments: POA-Jeff provided PLOF and home environment    Prior Function Level of Independence: Independent         Comments: Pt was very independent and walked his dog a couple miles per day.     Hand Dominance        Extremity/Trunk Assessment   Upper Extremity Assessment Upper Extremity Assessment: RUE deficits/detail;LUE deficits/detail RUE Deficits / Details: ROM WFL,  Gross MMT assessment: finger flex 3/5, finger ext 0/5; wrist 0/5; elbow 1/5; shoulder 1/5; Very weak LUE Deficits / Details: ROM WFL; Gross MMT:  2-3/5 throughout    Lower Extremity Assessment Lower Extremity Assessment: LLE deficits/detail;RLE deficits/detail RLE Deficits / Details: ROM: stiff but WFL; MMT: grossly 2/5 throughout with increased time (weaker than L) LLE Deficits / Details: ROM: stiff but WFL; MMT: grossly 2/5 throughout with increased time    Cervical / Trunk Assessment Cervical / Trunk Assessment: Normal  Communication   Communication: Expressive difficulties (recently extubated - whispering, aphasia; Not HOH -confirmed with POA)  Cognition Arousal/Alertness: Awake/alert Behavior During Therapy: WFL for tasks assessed/performed Overall Cognitive Status: Impaired/Different from baseline Area of Impairment: Orientation;Attention;Memory;Following commands;Awareness;Problem solving                 Orientation Level: Disoriented to;Time;Situation Current Attention Level: Sustained Memory: Decreased recall of precautions;Decreased short-term memory Following Commands: Follows one step commands inconsistently;Follows one step commands with increased time   Awareness: Intellectual Problem Solving: Slow processing;Decreased initiation;Difficulty sequencing;Requires verbal cues;Requires tactile cues General Comments: Requiring significant time to respond; able to tell name and birthday but limited other verbal responses, did nod yes/no approprialy most of the time; unable to provide history      General Comments General comments (skin integrity, edema, etc.): All VSS on 2 L O2    Exercises General Exercises - Lower Extremity Ankle Circles/Pumps: AROM;Both;5 reps;Supine Quad Sets: AROM;Both;5 reps;Supine Short Arc Quad: AROM;Both;5 reps;Supine;AAROM (with fatigued required AAROM on R) Heel Slides: AAROM;Both;5 reps;Supine Other Exercises Other Exercises: requiring multimodal cues and assist to initiate all exercises   Assessment/Plan    PT Assessment Patient needs continued PT services  PT Problem List  Decreased strength;Decreased mobility;Decreased range of motion;Decreased activity tolerance;Decreased cognition;Decreased balance;Decreased knowledge of use of DME;Pain;Cardiopulmonary status limiting activity       PT Treatment Interventions DME instruction;Therapeutic activities;Gait training;Therapeutic exercise;Patient/family education;Functional mobility training;Balance training;Wheelchair mobility training;Neuromuscular re-education    PT Goals (Current goals can be found in the Care Plan section)  Acute Rehab PT Goals Patient Stated Goal: unable PT Goal Formulation: Patient unable to participate in goal setting Time For Goal Achievement: 01/29/21 Potential to Achieve Goals: Good    Frequency Min 4X/week   Barriers to discharge Decreased caregiver support      Co-evaluation               AM-PAC PT "6 Clicks" Mobility  Outcome Measure Help needed turning from your back to your side while in a flat bed without using bedrails?: Total Help needed moving from lying on your back to sitting on the side of a flat bed without using bedrails?: Total Help needed moving to and from a bed to a chair (including a wheelchair)?: Total Help needed standing up from a chair using your arms (e.g., wheelchair or bedside chair)?: Total Help needed to walk in hospital room?: Total Help needed climbing 3-5 steps with a railing? : Total 6 Click Score: 6    End of Session Equipment Utilized During Treatment: Oxygen Activity Tolerance:  Patient tolerated treatment well Patient left: in bed;with call bell/phone within reach;with bed alarm set Nurse Communication: Mobility status PT Visit Diagnosis: Unsteadiness on feet (R26.81);Muscle weakness (generalized) (M62.81);Hemiplegia and hemiparesis Hemiplegia - Right/Left: Right Hemiplegia - dominant/non-dominant: Non-dominant Hemiplegia - caused by: Cerebral infarction    Time: 1455-1525 PT Time Calculation (min) (ACUTE ONLY): 30  min   Charges:   PT Evaluation $PT Eval Moderate Complexity: 1 Mod PT Treatments $Therapeutic Exercise: 8-22 mins        Abran Richard, PT Acute Rehab Services Pager [phone] [facility] [phone]    Karlton Lemon 01/15/2021, 3:53 PM

## 2021-01-15 NOTE — Progress Notes (Signed)
NAME:  [name], MRN:  [id number], DOB:  06-24-1951, LOS: 12 ADMISSION DATE:  01/03/2021, CONSULTATION DATE:  01/03/2021 REFERRING MD:  Dr. Haroldine Laws, CHIEF COMPLAINT:  STEMI/ cardiogenic shock  History of Present Illness:   70 year old male with PMHx HTN, HLD, CAD presenting via EMS with sudden onset of CP, found to have inferior STEMI with bradycardia and hypotension.  Taken emergently to Cath Lab on arrival.  Temporary pacer was placed with good capture; however, patient then developed profound hypotension decompensating into cardiac arrest, requiring intubation by anesthesia and CPR with episodes of VF requiring defibrillation and amiodarone. Patient was found to have total proximal RCA occlusion and underwent PCI to the RCA with stent placement.  Procedure was further c/b cardiogenic shock (requiring placement of Impella device) c/b bleeding secondary to cangrelor with development of bleeding and hematoma at puncture sites, Hgb dropped from 12.6 to 7.5 requiring emergent transfusion of 4U PRBCs and cangrelor discontinuation. Patient required vasopressors on initial presentation to ICU. Patient returned to ICU intubated and sedated in cardiogenic shock with TVP and Impella. PCCM consulted for vent management.   Pertinent  Medical History  HTN, HLD, CAD  Significant Hospital Events: Including procedures, antibiotic start and stop dates in addition to other pertinent events   . 4/14 Admitted Cards with inferior STEMI-> bradycardia s/p TVP, cardiogenic shock and cardiac arrest s/p impella with PCI/stent to RCA, complicated by bleeding s/p 4 units PRBC thought 2/2 cangrelor, pending coags.  Has R IJ PA cath, TVP R groin, impella L groin . 4/15 Remains on impella, weaning pressors and inotropes. Started diuresis due to significantly increased oxygen requirements. . 4/18 Concern for seizure-like activity, Neuro consult, LTM/EEG, CT Head NAICA/no evidence of edema. Transitioned to ceftriaxone  from cefepime. . 4/19 Remains on Impella, off pressors. Continuing diuresis with hopeful discontinuation of Impella, allowing for MRI 4/20-4/21. Continuing LTM EEG, given facial twitching/unclear neurologic status. . 4/20 >8L UOP/24H with Lasix gtt, Impella at P6, MRI Brain pending Impella removal . 4/24 CRRT stopped, reintubated . 4/25 - weaned all day. Good response to diuretics  Interim History / Subjective:  Good response to diuresis, tolerated weaning all day yesterday.   Objective   Blood pressure (!) 139/53, pulse 89, temperature 99.1 F (37.3 C), temperature source Axillary, resp. rate 19, height 6\' 1"  (1.854 m), weight 91.2 kg, SpO2 100 %. CVP:  [4 mmHg-11 mmHg] 5 mmHg  Vent Mode: PSV;CPAP FiO2 (%):  [40 %] 40 % Set Rate:  [10 bmp] 10 bmp Vt Set:  [640 mL] 640 mL PEEP:  [5 cmH20] 5 cmH20 Pressure Support:  [8 cmH20] 8 cmH20 Plateau Pressure:  [13 cmH20-16 cmH20] 13 cmH20   Intake/Output Summary (Last 24 hours) at 01/15/2021 0757 Last data filed at 01/15/2021 0732 Gross per 24 hour  Intake 2212.43 ml  Output 5400 ml  Net -3187.57 ml   Filed Weights   01/13/21 0359 01/14/21 0500 01/15/21 0500  Weight: 94.8 kg 91.6 kg 91.2 kg    Physical Examination: Constitutional: Elderly gentleman intubated mechanical life support, critically ill Eyes: Reactive  HEENT: Endotracheal tube in place, OGT in place.  Cardiovascular: Regular rate rhythm S1-S2 Respiratory: Chest clear bilaterally. SBI 42 on PSV. Gastrointestinal: Soft, nontender nondistended - flexiseal with diarrhea. Skin: No rash, dependent edema in the upper and lower extremities. Ecchymosis left groin.  Neurologic: Opens eyes weakly, purposeful left arm, withdrawal only RUE, BLE.  Psychiatric: RASS -2   Labs/imaging that I have personally reviewed: (right click and "Reselect all  SmartList Selections" daily)   K:3.2. Creat up 3.50  Resolved Hospital Problem list   Hemorrhagic shock Lactic acidosis  Assessment &  Plan:   Acute inferior STEMI post PCI and stenting RCA, present on admission Cardiogenic shock, present on admission now resolved. Ventricular fibrillation, cardiac arrest Acute systolic heart failure, present on admission now resolved. Will introduce low dose Carvedilol and titrate up as tolerated.  Dual antiplatelet therapy aspirin plus Brilinta Continue Crestor Unable to give ACE due to AKI - consider HDZN+NTG depending on BP Follow coox for one more day.  Off pressors at this time  AKI - appears euvolemic at this time.  Holding on CVVHD at this time. Appreciate nephrology input. Follow urine output  Anoxic brain injury- having some improvement on exam so giving more time Acute bilateral strokes, multiple microembolic infarctions on MRI Initial EEG negative, Twitching- no EEG correlate so thought less likely seizures - Stop AED's - Hold all sedation and allow patient to wake up   Acute hypoxemic respiratory failure with encephalopathy being barrier to extubation  Possible HCAP - SBT today and possible extubation if mental status allows.  -May consider a tracheostomy tube placement in future after further discussion with family -Stop Zosyn as cultures negative   Best practice (right click and "Reselect all SmartList Selections" daily)  Diet:  Tube Feed  on hold pending possible extubation Pain/Anxiety/Delirium protocol (if indicated): Yes (RASS goal 0) to -1 stop propofol.  VAP protocol (if indicated): Yes DVT prophylaxis: Subcutaneous Heparin GI prophylaxis: PPI will add fiber for diarrhea. Glucose control:  SSI Yes and Basal insulin Yes Control is adequate.  Central venous access:  Yes, and it is still needed remove introducer and arterial line. Keep HD catheter per nephrology.  Arterial line:  Yes, and it is no longer needed Foley:  Yes, and it is still needed remove tomorrow if creatinine improving.  Mobility:  bed rest - progressive mobilization once extubated.  PT  consulted: N/A Last date of multidisciplinary goals of care discussion daily with POA Code Status:  full code Disposition: ICU   This patient is critically ill with multiple organ system failure; which, requires frequent high complexity decision making, assessment, support, evaluation, and titration of therapies. This was completed through the application of advanced monitoring technologies and extensive interpretation of multiple databases. During this encounter critical care time was devoted to patient care services described in this note for 40 minutes.  Kipp Brood, MD [facility] 01/15/2021 7:57 AM

## 2021-01-15 NOTE — Progress Notes (Signed)
   01/15/21 2130  Pre-Screen Questions- If "YES" to any of the following questions, STOP the screen, keep NPO, and place order for SLP eval and treat   Home diet required thickened liquids No  Trach tube present No  Radiation to Head/Neck No  Patient Readiness for Screen - If "YES" to any of the following questions, WAIT to screen, keep NPO  Is patient lethargic or unable to stay alert/awake? No  HOB restricted to <30 degrees No  NPO for planned procedure No  Brief Cognitive Screen- Aspiration Risk Assessment  Is patient oriented to name, place, or year? Yes  Able to open mouth, stick out tongue, or smile Yes  Oral Mechanism Exam  Able to seal lips Yes  Able to move tongue from side to side Yes  Face is symmetric Yes  Mandatory Oral Care Performed   Mandatory oral care performed Yes  3 oz Water Swallow Challenge  Does the patient stop drinking? No  Does the patient cough/choke? (!) Yes  Screening Result (!) Failed    Pt failed bedside swallow eval as noted above.  PO meds held.

## 2021-01-15 NOTE — Progress Notes (Signed)
Subjective: NAEO. Extubated today  ROS: Unable to obtain due to trouble speaking after recent extubation  Examination  Vital signs in last 24 hours: Temp:  [97.4 F (36.3 C)-99.8 F (37.7 C)] 98.2 F (36.8 C) (04/26 1144) Pulse Rate:  [69-98] 81 (04/26 1200) Resp:  [11-26] 22 (04/26 1200) BP: (117-152)/(48-82) 127/63 (04/26 1200) SpO2:  [100 %] 100 % (04/26 1200) Arterial Line BP: (125-152)/(48-66) 144/59 (04/26 1000) FiO2 (%):  [40 %] 40 % (04/26 0748) Weight:  [91.2 kg] 91.2 kg (04/26 0500)  General: lying in bed,not in apparent distress CVS: pulse-normal rate and rhythm RS: coarse breath sounds bilaterally Extremities: normal,warm Neuro:awake, alert, able to slowly say his name "[name]", able to stick out his tongue but didn't name objects, PERLA, EOMI antigravity strength in all extremities with  RUE drift  Basic Metabolic Panel: Recent Labs  Lab 01/10/21 0429 01/10/21 0609 01/12/21 0510 01/12/21 0648 01/12/21 1557 01/12/21 1604 01/13/21 0430 01/13/21 1637 01/13/21 1638 01/14/21 0450 01/14/21 0458 01/14/21 1702 01/15/21 0333 01/15/21 0334  NA 144   < > 141   < > 141   < > 139  139 138   < > 138 139 140 139 140  K 3.7   < > 3.7   < > 4.4   < > 4.2  4.2 3.8   < > 3.8 3.8 3.6 3.3* 3.2*  CL 101   < > 104  --  105  --  105  104 104  --  105  --  99 101  --   CO2 29   < > 29  --  25  --  28  28 23   --  24  --  24 24  --   GLUCOSE 174*   < > 147*  --  128*  --  140*  140* 158*  --  150*  --  169* 156*  --   BUN 89*   < > 73*  --  55*  --  48*  47* 60*  --  77*  --  90* 105*  --   CREATININE 4.71*   < > 2.99*  --  2.11*  --  1.72*  1.69* 2.14*  --  2.86*  --  3.26* 3.50*  --   CALCIUM 7.8*   < > 8.1*  --  8.1*  --  8.2*  8.1* 8.3*  --  8.1*  --  7.9* 8.1*  --   MG 2.5*  --  2.5*  --   --   --  2.4  --   --  2.6*  --   --  2.6*  --   PHOS  --    < > 2.9  --  3.3  --  2.9  --   --  3.8  --  5.1* 6.4*  --    < > = values in this interval not displayed.     CBC: Recent Labs  Lab 01/12/21 0510 01/12/21 5465 01/13/21 0430 01/13/21 1638 01/14/21 0450 01/14/21 0458 01/14/21 1702 01/15/21 0333 01/15/21 0334  WBC 10.3  --  18.2*  --  17.9*  --  17.9* 16.5*  --   HGB 8.3*   < > 8.2*   < > 7.5* 6.8* 7.7* 8.7* 8.2*  HCT 26.5*   < > 25.7*   < > 23.5* 20.0* 23.6* 26.5* 24.0*  MCV 95.0  --  95.2  --  93.6  --  92.5 89.8  --  PLT 155  --  185  --  234  --  251 272  --    < > = values in this interval not displayed.     Coagulation Studies: No results for input(s): LABPROT, INR in the last 72 hours.  Imaging No new brain imaging overnight  ASSESSMENT AND PLAN: 70 year old male presented with chest painwho initially presented on 4/14/2022with chest pain and was found to have STEMI, s/p LHC andplacement of TVP for bradycardia, placement of impella, and stenting of RCA.Postop course was complicated by V. fib arrest and hemorrhagic shock at the site of Impella insertion. Today patient will need to have twitching of mouth.  Seizure-like episode Acute embolic strokes Acute encephalopathy ( improving) AKI -Encephalopathy likely secondary touremia, appears to be improving -Multiple punctate ischemic strokes likely cardioembolic due to left heart cath.  Recommendations: - Keppra discontinued by critical care team. I agree as we didn't see any evidence of epileptogenicity and the twitching seen earlier during admission was likely myoclonus or provokes seizure in setting of cefepime toxicity and renal dysfunction - Continue ASA 81mg  daily and rosuvastatin 10mg  daily for sec stroke prevention -Continue seizure precautions - Continue PT/OT -As needed IV Ativan 2 mg for clinical seizure-like activity lasting more than 5 minutes. Please call neuro if needed. - F/u with neuro in 8-12 weeks  Thank you for allowing [place] to participate in the care of this patient. Neuro will sign off. Please call us for any further questions.  I have spent  a total of50minuteswith the patient reviewing hospitalnotes, test results, labs and examining the patient as well as establishing an assessment and plan.>50% of time was spent in direct patient care.  Zeb Comfort [facility] For questions after 5pm please refer to AMION to reach the Neurologist on call

## 2021-01-15 NOTE — Progress Notes (Signed)
Patient ID: [name], male   DOB: 27-Feb-1951, 70 y.o.   MRN: [id number]     Advanced Heart Failure Rounding Note  PCP-Cardiologist: No primary care provider on file.   Subjective:    4/20 Impella removed. MRI brain with numerous micro embolic infarctions. Off pressors.  4/21 lasix drip stopped.  4/22 started CVVH 4/24 CVVH stopped. Failed extubation. Re-intubated.   Remains intubated FiO2 40%.   Per RN will awake and shake his head "no" to questions. Will open eyes for me but not follow commands.   Diuresed 5L overnight. Weight unchanged (?)  Creatinine 2.5 -> 3.3  BUN 90 -> 105  Hgb 7.7    Objective:   Weight Range: 91.2 kg Body mass index is 26.53 kg/m.   Vital Signs:   Temp:  [97.4 F (36.3 C)-99.8 F (37.7 C)] 99.1 F (37.3 C) (04/26 0400) Pulse Rate:  [70-88] 78 (04/26 0500) Resp:  [11-27] 18 (04/26 0500) BP: (117-152)/(48-67) 132/55 (04/26 0400) SpO2:  [100 %] 100 % (04/26 0500) Arterial Line BP: (117-149)/(43-60) 125/48 (04/26 0500) FiO2 (%):  [40 %] 40 % (04/26 0310) Weight:  [91.2 kg] 91.2 kg (04/26 0500) Last BM Date: 01/14/21  Weight change: Filed Weights   01/13/21 0359 01/14/21 0500 01/15/21 0500  Weight: 94.8 kg 91.6 kg 91.2 kg    Intake/Output:   Intake/Output Summary (Last 24 hours) at 01/15/2021 0620 Last data filed at 01/15/2021 0500 Gross per 24 hour  Intake 2212.43 ml  Output 5000 ml  Net -2787.57 ml      Physical Exam   General:  Intubated sedated HEENT: normal Neck: supple. LIJ TLC RIJ sheath . Carotids 2+ bilat; no bruits. No lymphadenopathy or thryomegaly appreciated. Cor: PMI nondisplaced. Regular rate & rhythm. No rubs, gallops or murmurs. Lungs: clear Abdomen: soft, nontender, nondistended. No hepatosplenomegaly. No bruits or masses. Good bowel sounds. Extremities: no cyanosis, clubbing, rash, tr-1+ edema L groin ecchymosis Neuro: will open eyes to stimulus    Telemetry   SR 70-80s Personally reviewed  Labs     CBC Recent Labs    01/14/21 1702 01/15/21 0333 01/15/21 0334  WBC 17.9* 16.5*  --   HGB 7.7* 8.7* 8.2*  HCT 23.6* 26.5* 24.0*  MCV 92.5 89.8  --   PLT 251 272  --    Basic Metabolic Panel Recent Labs    01/14/21 0450 01/14/21 0458 01/14/21 1702 01/15/21 0333 01/15/21 0334  NA 138   < > 140 139 140  K 3.8   < > 3.6 3.3* 3.2*  CL 105  --  99 101  --   CO2 24  --  24 24  --   GLUCOSE 150*  --  169* 156*  --   BUN 77*  --  90* 105*  --   CREATININE 2.86*  --  3.26* 3.50*  --   CALCIUM 8.1*  --  7.9* 8.1*  --   MG 2.6*  --   --  2.6*  --   PHOS 3.8  --  5.1* 6.4*  --    < > = values in this interval not displayed.   Liver Function Tests Recent Labs    01/14/21 1702 01/15/21 0333  ALBUMIN 2.4* 2.3*   No results for input(s): LIPASE, AMYLASE in the last 72 hours. Cardiac Enzymes No results for input(s): CKTOTAL, CKMB, CKMBINDEX, TROPONINI in the last 72 hours.  BNP: BNP (last 3 results) No results for input(s): BNP in the last 8760 hours.  ProBNP (last 3 results) No results for input(s): PROBNP in the last 8760 hours.   D-Dimer No results for input(s): DDIMER in the last 72 hours. Hemoglobin A1C No results for input(s): HGBA1C in the last 72 hours. Fasting Lipid Panel Recent Labs    01/14/21 0450  TRIG 61   Thyroid Function Tests No results for input(s): TSH, T4TOTAL, T3FREE, THYROIDAB in the last 72 hours.  Invalid input(s): FREET3  Other results:   Imaging    No results found.   Medications:     Scheduled Medications: . aspirin  81 mg Per Tube Daily  . chlorhexidine gluconate (MEDLINE KIT)  15 mL Mouth Rinse BID  . Chlorhexidine Gluconate Cloth  6 each Topical Daily  . feeding supplement (PROSource TF)  45 mL Per Tube TID  . insulin aspart  2-6 Units Subcutaneous Q4H  . ipratropium-albuterol  3 mL Nebulization Q4H  . mouth rinse  15 mL Mouth Rinse 10 times per day  . modafinil  100 mg Per Tube Daily  . pantoprazole (PROTONIX) IV  40  mg Intravenous Q12H  . polyethylene glycol  17 g Per Tube BID  . rosuvastatin  10 mg Per Tube Daily  . sodium chloride flush  10-40 mL Intracatheter Q12H  . sodium chloride flush  3 mL Intravenous Q12H  . ticagrelor  90 mg Per Tube BID    Infusions: . sodium chloride 250 mL (01/11/21 1007)  . dexmedetomidine (PRECEDEX) IV infusion Stopped (01/14/21 2026)  . dextrose Stopped (01/13/21 1900)  . feeding supplement (VITAL 1.5 CAL) 1,000 mL (01/14/21 0600)  . ferric gluconate (FERRLECIT/NULECIT) IV Stopped (01/14/21 1503)  . levETIRAcetam Stopped (01/14/21 2210)  . piperacillin-tazobactam (ZOSYN)  IV Stopped (01/15/21 0300)  . propofol (DIPRIVAN) infusion 10 mcg/kg/min (01/15/21 0500)    PRN Medications: sodium chloride, sodium chloride, acetaminophen (TYLENOL) oral liquid 160 mg/5 mL, dextrose, fentaNYL (SUBLIMAZE) injection, LORazepam, ondansetron (ZOFRAN) IV, sodium chloride flush, sodium chloride flush    Assessment/Plan   1. CAD/ Acute Inferior STEMI - emergent cath w/ pRCA occlusion s/p PCI + DES - Initial angiography showed sluggish flow in LAD and LCX>>improved post impella placement  - Echo LVEF 20-25%, RV moderately reduced - DAPT w/ ASA + Brilinta . Hgb 7.7 this am.  No obvious bleeding. Contiue to follow - Crestor 10 per tube   2. VF Arrest - in setting of acute MI, now s/p revascularization  - no further ectopy on tele. Amio stopped - keep K > 4.0 and Mg > 2.0  - EEG with diffuse encephalopathy but no seizure activity. - MRI brain micro-emboli.   - Starting to follow commands. ? RUE weakness per NEuro  3. Shock, Combination Cardiogenic + Hemorrhagic  - Acute MI + developed severe hemorrhagic shock due to bleeding at left groin Impella Transfused 4U PRBCs . - Impella out 4/20  - off pressors. Co-ox stable at 70%  4. Acute Systolic Heart Failure>>Cardiogenic Shock  - ICM Echo LVEF 20-25%, RV moderately reduced - Impella out 4/20. Off all pressors. Co-ox 70% -  CVVH stopped 4/24.  - Diuresed well with 160 IV bid of lasix yesterday in order to help facilitate repeat trial of extubation  - Will hold lasix today with rising Scr.  - No bb with shock.  - No spiro/arni/dig with AKI.   5. Acute hypoxic respiratory failure - now on 40% FIO2 - 4/24 failed extubation trial.  - CCM following - rpeat trial of extubation later this week. May need trach  5. Nonoliguric AKI - due to ATN/shock - CVVH begun 4/22 and stopped 4/24  - SCr rising again with diuresis. Making good urine. Hold diuretics today and follow. May need CVVHD tomorrow if BUN still climbing  6. Hypokalemia/hyperkalemia - supp  7. Leukocytosis -WBC stable at 17.9 -Cultures NGTD   8. DM2 - off insulin gtt - now on levimir and SSI  9. Encephalopathy -->uremia, acute embolic strokes.  - post cardiac arrest - head CT negative - EEG w/ generalized periodic discharges with triphasic morphology + moderate to severe diffuse encephalopathy.  - ? cefepime toxicity +/- anoxic /hypoxic brain injury. Now off cefepime - neurology following   - MRI- numerous micro embolic infarctions . No hemorrhage or mass.  - On keppra - CVVH begun 4/22 for ?uremia contribution to encephalopathy. Stopped 4/24.  - plan as above  10. Acute blood loss anemia  -Received 1u on 4/25.  Continue to follow   CRITICAL CARE Performed by: Glori Bickers  Total critical care time: 35 minutes  Critical care time was exclusive of separately billable procedures and treating other patients.  Critical care was necessary to treat or prevent imminent or life-threatening deterioration.  Critical care was time spent personally by me (independent of midlevel providers or residents) on the following activities: development of treatment plan with patient and/or surrogate as well as nursing, discussions with consultants, evaluation of patient's response to treatment, examination of patient, obtaining history from patient  or surrogate, ordering and performing treatments and interventions, ordering and review of laboratory studies, ordering and review of radiographic studies, pulse oximetry and re-evaluation of patient's condition.   Length of Stay: 12  Glori Bickers, MD  6:20 AM

## 2021-01-15 NOTE — Progress Notes (Signed)
[facility] NEPHROLOGY PROGRESS NOTE  Assessment/ Plan: Pt is a 70 y.o. yo male with history of HTN, HLD, CAD presented with chest pain found to have STEMI.  He underwent left heart cath, placement of Impella device, had V. fib arrest, mechanical ventilation for respiratory failure and AKI.  #Acute kidney injury due to cardiogenic/hemorrhagic shock.  UA bland and kidney ultrasound unremarkable.  Required CRRT from 4/22-4/24.   Cardiologist ordered IV Lasix yesterday with 5.2 L of urine output.  Creatinine level mildly up trended probably hemodynamic changes.  Recommend to hold Lasix today. No plan for RRT today and watch for renal recovery.  Continue to keep temporary HD catheter.  #CAD/acute STEMI required emergent cath with stent placement.  EF 20 to 25%.  Cardiology following.  #V. fib arrest/cardiogenic shock, acute systolic CHF: Arrest in the setting of acute MI.  Impella was removed.  Now off of pressors or inotropes.  Volume status is acceptable.  #Ventilator dependent respiratory failure: On mechanical ventilation.  Managed by PCCM.  #Anemia due to acute blood loss: Required multiple blood transfusion.  Transfuse as needed.  #Hypokalemia: Replete KCl.  Subjective: Seen and examined.  Received IV Lasix yesterday with robust urine output.  Remains on vent.  No new event. Objective Vital signs in last 24 hours: Vitals:   01/15/21 0600 01/15/21 0742 01/15/21 0748 01/15/21 0800  BP: (!) 132/53 (!) 139/53    Pulse: 69 89    Resp: 12 19    Temp:    98.3 F (36.8 C)  TempSrc:    Axillary  SpO2: 100% 100% 100%   Weight:      Height:       Weight change: -0.4 kg  Intake/Output Summary (Last 24 hours) at 01/15/2021 0831 Last data filed at 01/15/2021 0759 Gross per 24 hour  Intake 2066.12 ml  Output 5315 ml  Net -3248.88 ml       Labs: Basic Metabolic Panel: Recent Labs  Lab 01/14/21 0450 01/14/21 0458 01/14/21 1702 01/15/21 0333 01/15/21 0334  NA 138    < > 140 139 140  K 3.8   < > 3.6 3.3* 3.2*  CL 105  --  99 101  --   CO2 24  --  24 24  --   GLUCOSE 150*  --  169* 156*  --   BUN 77*  --  90* 105*  --   CREATININE 2.86*  --  3.26* 3.50*  --   CALCIUM 8.1*  --  7.9* 8.1*  --   PHOS 3.8  --  5.1* 6.4*  --    < > = values in this interval not displayed.   Liver Function Tests: Recent Labs  Lab 01/10/21 0429 01/11/21 1532 01/14/21 0450 01/14/21 1702 01/15/21 0333  AST 39  --   --   --   --   ALT 32  --   --   --   --   ALKPHOS 154*  --   --   --   --   BILITOT 0.9  --   --   --   --   PROT 5.1*  --   --   --   --   ALBUMIN 1.7*   < > 2.3* 2.4* 2.3*   < > = values in this interval not displayed.   No results for input(s): LIPASE, AMYLASE in the last 168 hours. No results for input(s): AMMONIA in the last 168 hours. CBC: Recent Labs  Lab 01/12/21 0510 01/12/21 0964 01/13/21 0430 01/13/21 1638 01/14/21 0450 01/14/21 0458 01/14/21 1702 01/15/21 0333 01/15/21 0334  WBC 10.3  --  18.2*  --  17.9*  --  17.9* 16.5*  --   HGB 8.3*   < > 8.2*   < > 7.5*   < > 7.7* 8.7* 8.2*  HCT 26.5*   < > 25.7*   < > 23.5*   < > 23.6* 26.5* 24.0*  MCV 95.0  --  95.2  --  93.6  --  92.5 89.8  --   PLT 155  --  185  --  234  --  251 272  --    < > = values in this interval not displayed.   Cardiac Enzymes: Recent Labs  Lab 01/11/21 1216  CKTOTAL 316   CBG: Recent Labs  Lab 01/14/21 1932 01/14/21 2303 01/15/21 0332 01/15/21 0624 01/15/21 0747  GLUCAP 124* 125* 145* 156* 140*    Iron Studies:  No results for input(s): IRON, TIBC, TRANSFERRIN, FERRITIN in the last 72 hours. Studies/Results: DG Chest 1 View  Result Date: 01/13/2021 CLINICAL DATA:  History of ET tube EXAM: CHEST  1 VIEW COMPARISON:  January 11, 2021 FINDINGS: The ETT terminates in good position. The left central line is stable terminating in the SVC. The right IJ sheath remains. The NG tube terminates below today's film. No pneumothorax. Opacity in the medial  right lung base is stable to mildly worsened in the interval. Mild opacity in left base is more prominent the interval. No other acute abnormalities. IMPRESSION: 1. Bibasilar opacities are mildly worsened in the interval worrisome for pneumonia. Recommend attention on follow-up. 2. Support apparatus as above. 3. No other acute abnormalities. Electronically Signed   By: Dorise Bullion III M.D   On: 01/13/2021 14:15   DG Abd 1 View  Result Date: 01/13/2021 CLINICAL DATA:  Evaluate OG tube EXAM: ABDOMEN - 1 VIEW COMPARISON:  January 09, 2021 FINDINGS: The OG tube is been pulled back in the interval. The distal tip is in the body of the stomach. The side port is near the GE junction. IMPRESSION: The NG tube is been pulled back in the interval the side port is now near the GE junction. Recommend advancing several cm. Electronically Signed   By: Dorise Bullion III M.D   On: 01/13/2021 14:15    Medications: Infusions: . sodium chloride 250 mL (01/11/21 1007)  . dextrose Stopped (01/13/21 1900)  . feeding supplement (VITAL 1.5 CAL) 1,000 mL (01/14/21 0600)  . ferric gluconate (FERRLECIT/NULECIT) IV Stopped (01/14/21 1503)    Scheduled Medications: . aspirin  81 mg Per Tube Daily  . carvedilol  3.125 mg Per Tube BID WC  . chlorhexidine gluconate (MEDLINE KIT)  15 mL Mouth Rinse BID  . Chlorhexidine Gluconate Cloth  6 each Topical Daily  . feeding supplement (PROSource TF)  45 mL Per Tube TID  . fiber  1 packet Per Tube BID  . heparin injection (subcutaneous)  5,000 Units Subcutaneous Q8H  . insulin aspart  2-6 Units Subcutaneous Q4H  . ipratropium-albuterol  3 mL Nebulization Q4H  . mouth rinse  15 mL Mouth Rinse 10 times per day  . modafinil  100 mg Per Tube Daily  . pantoprazole (PROTONIX) IV  40 mg Intravenous Q12H  . polyethylene glycol  17 g Per Tube BID  . rosuvastatin  10 mg Per Tube Daily  . sodium chloride flush  10-40 mL Intracatheter Q12H  .  sodium chloride flush  3 mL Intravenous  Q12H  . ticagrelor  90 mg Per Tube BID    have reviewed scheduled and prn medications.  Physical Exam: General: Sedated, intubated Heart:RRR, s1s2 nl Lungs: Coarse breath sound bilateral Abdomen:soft,  non-distended Extremities:No edema Dialysis Access: L IJ temporary HD catheter  [name] Furry 01/15/2021,8:31 AM  LOS: 12 days

## 2021-01-16 DIAGNOSIS — R570 Cardiogenic shock: Secondary | ICD-10-CM | POA: Diagnosis not present

## 2021-01-16 LAB — BASIC METABOLIC PANEL
Anion gap: 15 (ref 5–15)
Anion gap: 14 (ref 5–15)
BUN: 113 mg/dL — ABNORMAL HIGH (ref 8–23)
BUN: 117 mg/dL — ABNORMAL HIGH (ref 8–23)
CO2: 25 mmol/L (ref 22–32)
CO2: 24 mmol/L (ref 22–32)
Calcium: 8.7 mg/dL — ABNORMAL LOW (ref 8.9–10.3)
Calcium: 8.6 mg/dL — ABNORMAL LOW (ref 8.9–10.3)
Chloride: 104 mmol/L (ref 98–111)
Chloride: 108 mmol/L (ref 98–111)
Creatinine, Ser: 3.91 mg/dL — ABNORMAL HIGH (ref 0.61–1.24)
Creatinine, Ser: 3.85 mg/dL — ABNORMAL HIGH (ref 0.61–1.24)
GFR, Estimated: 16 mL/min — ABNORMAL LOW (ref >60)
GFR, Estimated: 16 mL/min — ABNORMAL LOW (ref >60)
Glucose, Bld: 136 mg/dL — ABNORMAL HIGH (ref 70–99)
Glucose, Bld: 163 mg/dL — ABNORMAL HIGH (ref 70–99)
Potassium: 3.3 mmol/L — ABNORMAL LOW (ref 3.5–5.1)
Potassium: 3.5 mmol/L (ref 3.5–5.1)
Sodium: 144 mmol/L (ref 135–145)
Sodium: 146 mmol/L — ABNORMAL HIGH (ref 135–145)

## 2021-01-16 LAB — GLUCOSE, CAPILLARY
Glucose-Capillary: 118 mg/dL — ABNORMAL HIGH (ref 70–99)
Glucose-Capillary: 132 mg/dL — ABNORMAL HIGH (ref 70–99)
Glucose-Capillary: 120 mg/dL — ABNORMAL HIGH (ref 70–99)
Glucose-Capillary: 123 mg/dL — ABNORMAL HIGH (ref 70–99)
Glucose-Capillary: 125 mg/dL — ABNORMAL HIGH (ref 70–99)

## 2021-01-16 LAB — CBC
HCT: 28.2 % — ABNORMAL LOW (ref 39.0–52.0)
Hemoglobin: 9 g/dL — ABNORMAL LOW (ref 13.0–17.0)
MCH: 29.6 pg (ref 26.0–34.0)
MCHC: 31.9 g/dL (ref 30.0–36.0)
MCV: 92.8 fL (ref 80.0–100.0)
Platelets: 386 10*3/uL (ref 150–400)
RBC: 3.04 MIL/uL — ABNORMAL LOW (ref 4.22–5.81)
RDW: 16.5 % — ABNORMAL HIGH (ref 11.5–15.5)
WBC: 14.4 10*3/uL — ABNORMAL HIGH (ref 4.0–10.5)
nRBC: 0 % (ref 0.0–0.2)

## 2021-01-16 LAB — MAGNESIUM: Magnesium: 2.5 mg/dL — ABNORMAL HIGH (ref 1.7–2.4)

## 2021-01-16 LAB — COOXEMETRY PANEL
Carboxyhemoglobin: 1.7 % — ABNORMAL HIGH (ref 0.5–1.5)
Methemoglobin: 1.3 % (ref 0.0–1.5)
O2 Saturation: 71.5 %
Total hemoglobin: 9.5 g/dL — ABNORMAL LOW (ref 12.0–16.0)

## 2021-01-16 MED ORDER — PROSOURCE TF PO LIQD
90.0000 mL | Freq: Two times a day (BID) | ORAL | Status: DC
  Administered 2021-01-16 – 2021-01-31 (×31): 90 mL
  Filled 2021-01-16 (×33): qty 90

## 2021-01-16 MED ORDER — HYDRALAZINE HCL 25 MG PO TABS
12.5000 mg | ORAL_TABLET | Freq: Three times a day (TID) | ORAL | Status: DC
  Administered 2021-01-16 – 2021-01-17 (×3): 12.5 mg via ORAL
  Filled 2021-01-16 (×3): qty 1

## 2021-01-16 MED ORDER — POTASSIUM CHLORIDE 20 MEQ PO PACK
40.0000 meq | PACK | Freq: Once | ORAL | Status: AC
  Administered 2021-01-16: 40 meq
  Filled 2021-01-16: qty 2

## 2021-01-16 MED ORDER — POTASSIUM CHLORIDE 20 MEQ PO PACK
20.0000 meq | PACK | Freq: Once | ORAL | Status: AC
  Administered 2021-01-16: 20 meq
  Filled 2021-01-16: qty 1

## 2021-01-16 MED ORDER — OSMOLITE 1.5 CAL PO LIQD
1000.0000 mL | ORAL | Status: DC
  Administered 2021-01-16 – 2021-01-24 (×8): 1000 mL
  Filled 2021-01-16 (×20): qty 1000

## 2021-01-16 MED ORDER — IPRATROPIUM-ALBUTEROL 0.5-2.5 (3) MG/3ML IN SOLN
3.0000 mL | Freq: Two times a day (BID) | RESPIRATORY_TRACT | Status: DC
  Administered 2021-01-16 – 2021-01-17 (×3): 3 mL via RESPIRATORY_TRACT
  Filled 2021-01-16 (×3): qty 3

## 2021-01-16 MED ORDER — ISOSORBIDE MONONITRATE ER 30 MG PO TB24
15.0000 mg | ORAL_TABLET | Freq: Every day | ORAL | Status: DC
  Administered 2021-01-16: 15 mg via ORAL
  Filled 2021-01-16: qty 1

## 2021-01-16 NOTE — Progress Notes (Signed)
[facility] NEPHROLOGY PROGRESS NOTE  Assessment/ Plan: Pt is a 70 y.o. yo male with history of HTN, HLD, CAD presented with chest pain found to have STEMI.  He underwent left heart cath, placement of Impella device, had V. fib arrest, mechanical ventilation for respiratory failure and AKI.  #Acute kidney injury due to cardiogenic/hemorrhagic shock.  UA bland and kidney ultrasound unremarkable.  Required CRRT from 4/22-4/24.  Initially required IV Lasix and now auto diuresing well however both BUN and creatinine level trending up.  Hard to assess uremic symptoms.  He is extubated and now in room air.  Plan to monitor kidney function closely, strict ins and out.  Volume status okay.  Assess daily for dialysis needs.  #CAD/acute STEMI required emergent cath with stent placement.  EF 20 to 25%.  Cardiology following.  #V. fib arrest/cardiogenic shock, acute systolic CHF: Arrest in the setting of acute MI.  Impella was removed.  Now off of pressors or inotropes.  Volume status is acceptable.  #Ventilator dependent respiratory failure: He is now extubated and in room air.  #Anemia due to acute blood loss: Required multiple blood transfusion.  Transfuse as needed.  #Hypokalemia: Replete KCl.  Subjective: Seen and examined.  He is now extubated and on room air.  Not on pressors.  He opens eyes but really not following commands.  Pending speech and swallow evaluation.  Objective Vital signs in last 24 hours: Vitals:   01/16/21 0600 01/16/21 0700 01/16/21 0740 01/16/21 0811  BP: (!) 157/74 (!) 155/75    Pulse: 88 82    Resp: (!) 23 (!) 22    Temp:    98.2 F (36.8 C)  TempSrc:    Oral  SpO2: 98% 98% 98%   Weight:      Height:       Weight change:   Intake/Output Summary (Last 24 hours) at 01/16/2021 0820 Last data filed at 01/16/2021 0600 Gross per 24 hour  Intake 427.16 ml  Output 2800 ml  Net -2372.84 ml       Labs: Basic Metabolic Panel: Recent Labs  Lab  01/14/21 0450 01/14/21 0458 01/14/21 1702 01/15/21 0333 01/15/21 0334 01/15/21 1641 01/16/21 0448  NA 138   < > 140 139 140 142 144  K 3.8   < > 3.6 3.3* 3.2* 3.4* 3.3*  CL 105  --  99 101  --  103 104  CO2 24  --  24 24  --  25 25  GLUCOSE 150*  --  169* 156*  --  130* 136*  BUN 77*  --  90* 105*  --  110* 113*  CREATININE 2.86*  --  3.26* 3.50*  --  3.66* 3.91*  CALCIUM 8.1*  --  7.9* 8.1*  --  8.2* 8.7*  PHOS 3.8  --  5.1* 6.4*  --   --   --    < > = values in this interval not displayed.   Liver Function Tests: Recent Labs  Lab 01/10/21 0429 01/11/21 1532 01/14/21 0450 01/14/21 1702 01/15/21 0333  AST 39  --   --   --   --   ALT 32  --   --   --   --   ALKPHOS 154*  --   --   --   --   BILITOT 0.9  --   --   --   --   PROT 5.1*  --   --   --   --  ALBUMIN 1.7*   < > 2.3* 2.4* 2.3*   < > = values in this interval not displayed.   No results for input(s): LIPASE, AMYLASE in the last 168 hours. No results for input(s): AMMONIA in the last 168 hours. CBC: Recent Labs  Lab 01/13/21 0430 01/13/21 1638 01/14/21 0450 01/14/21 0458 01/14/21 1702 01/15/21 0333 01/15/21 0334 01/16/21 0448  WBC 18.2*  --  17.9*  --  17.9* 16.5*  --  14.4*  HGB 8.2*   < > 7.5*   < > 7.7* 8.7* 8.2* 9.0*  HCT 25.7*   < > 23.5*   < > 23.6* 26.5* 24.0* 28.2*  MCV 95.2  --  93.6  --  92.5 89.8  --  92.8  PLT 185  --  234  --  251 272  --  386   < > = values in this interval not displayed.   Cardiac Enzymes: Recent Labs  Lab 01/11/21 1216  CKTOTAL 316   CBG: Recent Labs  Lab 01/15/21 1550 01/15/21 1947 01/15/21 2334 01/16/21 0437 01/16/21 0809  GLUCAP 128* 108* 128* 118* 132*    Iron Studies:  No results for input(s): IRON, TIBC, TRANSFERRIN, FERRITIN in the last 72 hours. Studies/Results: No results found.  Medications: Infusions: . sodium chloride 10 mL/hr at 01/15/21 2248  . dextrose 40 mL/hr at 01/15/21 2249  . feeding supplement (VITAL 1.5 CAL) Stopped (01/15/21  1100)    Scheduled Medications: . aspirin  81 mg Per Tube Daily  . Chlorhexidine Gluconate Cloth  6 each Topical Daily  . feeding supplement (PROSource TF)  45 mL Per Tube TID  . fiber  1 packet Per Tube BID  . heparin injection (subcutaneous)  5,000 Units Subcutaneous Q8H  . hydrALAZINE  12.5 mg Oral Q8H  . insulin aspart  2-6 Units Subcutaneous Q4H  . ipratropium-albuterol  3 mL Nebulization Q4H  . isosorbide mononitrate  15 mg Oral Daily  . mouth rinse  15 mL Mouth Rinse BID  . modafinil  100 mg Per Tube Daily  . pantoprazole (PROTONIX) IV  40 mg Intravenous Q12H  . polyethylene glycol  17 g Per Tube BID  . potassium chloride  20 mEq Per Tube Once  . potassium chloride  40 mEq Per Tube Once  . rosuvastatin  10 mg Per Tube Daily  . sodium chloride flush  10-40 mL Intracatheter Q12H  . sodium chloride flush  3 mL Intravenous Q12H  . ticagrelor  90 mg Per Tube BID    have reviewed scheduled and prn medications.  Physical Exam: General: Extubated, looks comfortable Heart:RRR, s1s2 nl Lungs: Clear anteriorly Abdomen:soft,  non-distended Extremities:No edema Dialysis Access: L IJ temporary HD catheter  Edyn Popoca Tanna Furry 01/16/2021,8:20 AM  LOS: 13 days

## 2021-01-16 NOTE — Evaluation (Signed)
Clinical/Bedside Swallow Evaluation Patient Details  Name: [name] MRN: [id number] Date of Birth: 08-11-1951  Today's Date: 01/16/2021 Time: SLP Start Time (ACUTE ONLY): 1009 SLP Stop Time (ACUTE ONLY): 1029 SLP Time Calculation (min) (ACUTE ONLY): 20 min  Past Medical History:  Past Medical History:  Diagnosis Date  . Anxiety   . Arthritis   . Cancer ([place]) 2010   basal cell ca, head  . Cataract    bilateral  . Depression   . ED (erectile dysfunction)   . GERD (gastroesophageal reflux disease)   . Hyperlipidemia   . Hypertension   . Kidney stone 07-29-11   passed   . 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 Vitreous floater    left eye, sees Dr. Dawna Part at [facility]    Past Surgical History:  Past Surgical History:  Procedure Laterality Date  . BASAL CELL CARCINOMA EXCISION     removed from scalp  . COLONOSCOPY  07/03/2017   per Dr. Havery Moros, sessile serrated polyp, repeat in 5 yrs   . HERNIA REPAIR    . LEFT HEART CATH AND CORONARY ANGIOGRAPHY N/A 01/03/2021   Procedure: LEFT HEART CATH AND CORONARY ANGIOGRAPHY;  Surgeon: Troy Sine, MD;  Location: [place] [facility];  Service: Cardiovascular;  Laterality: N/A;  . RIGHT HEART CATH N/A 01/03/2021   Procedure: RIGHT HEART CATH;  Surgeon: Jolaine Artist, MD;  Location: [place] [facility];  Service: Cardiovascular;  Laterality: N/A;   HPI:  Pt is 70 y.o. with PMH of  HTN, HLD, CAD, admitted with chest pain and STEMI with bradycardia and hypotension. Pt taken for urgent cath with temporary pacer placed but pt decompensated to cardiac arrest with intubation and CPR. Stent placed and pt with further complication of cardiogenic shock with Impella placed. 4/18 seizure like activity, Impella removed 4/20, 4/20 MRI with scattered micro embolic infarctions. ETT 4/14-4/24 and reintubated 2 hrs later. Extubated 4/26. EEG (4/23) revealed moderate diffuse encephalopathy, nonspecific etiology. Chest xray  (4/24) revealed:"Bibasilar opacities are mildly worsened in the interval worrisome for pneumonia". Failed [facility] Swallow Screen 2/26.   Assessment / Plan / Recommendation Clinical Impression  Pt alert and upright in bed for swallow evaluation. Given receptive language deficits, pt unable to follow commands to complete thorough oral mechanism examination, however labial and lingual ROM/symmetry appeared [facility]. Pt unable to phonate /ah/ on command, but delayed weak cough response was elicited after sips of thin liquids via straw. Single ice chips, teaspoons of thin liquids and pureed solids unremarkable for s/sx of aspiration, however cannot completely r/o oropharyngeal dysphagia or silent aspiration given clinical context. Poor awareness and attention to PO intake also noted, which further increases pt risk for aspiration. Recommend pt remain NPO with alternative means of nutrition at this time. Pt may have single ice chips after thorough oral care when sitting in an upright position. SLP to f/u for PO trials and readiness for instrumental assessment provided vocal quality and awareness improves. Discussed with RN and RD who will order Cortrak placement.   SLP Visit Diagnosis: Dysphagia, unspecified (R13.10)    Aspiration Risk  Severe aspiration risk    Diet Recommendation NPO;Ice chips PRN after oral care   Medication Administration: Via alternative means Postural Changes: Seated upright at 90 degrees    Other  Recommendations Oral Care Recommendations: Oral care QID;Oral care prior to ice chip/H20;Staff/trained caregiver to provide oral care   Follow up Recommendations Inpatient Rehab  Frequency and Duration min 2x/week  2 weeks       Prognosis Prognosis for Safe Diet Advancement: Fair Barriers to Reach Goals: Cognitive deficits;Severity of deficits;Language deficits      Swallow Study   General Date of Onset: 11/17/20 HPI: Pt is 70 y.o. with PMH of  HTN, HLD, CAD, admitted with  chest pain and STEMI with bradycardia and hypotension. Pt taken for urgent cath with temporary pacer placed but pt decompensated to cardiac arrest with intubation and CPR. Stent placed and pt with further complication of cardiogenic shock with Impella placed. 4/18 seizure like activity, Impella removed 4/20, 4/20 MRI with scattered micro embolic infarctions. ETT 4/14-4/24 and reintubated 2 hrs later. Extubated 4/26. EEG (4/23) revealed moderate diffuse encephalopathy, nonspecific etiology. Chest xray (4/24) revealed:"Bibasilar opacities are mildly worsened in the interval worrisome for pneumonia". Failed [facility] Swallow Screen 2/26. Type of Study: Bedside Swallow Evaluation Previous Swallow Assessment: none Diet Prior to this Study: NPO Temperature Spikes Noted: No Respiratory Status: Room air History of Recent Intubation: Yes Length of Intubations (days): 12 days Date extubated: 01/15/21 Behavior/Cognition: Alert;Pleasant mood;Cooperative;Requires cueing Oral Cavity Assessment: Within Functional Limits;Other (comment) (mild secretions suctioned prior to POs) Oral Care Completed by SLP: No Oral Cavity - Dentition: Adequate natural dentition Vision:  (unable to assess) Self-Feeding Abilities: Total assist Patient Positioning: Upright in bed;Postural control adequate for testing Baseline Vocal Quality: Aphonic Volitional Cough:  (unable to elicit) Volitional Swallow: Unable to elicit    Oral/Motor/Sensory Function Overall Oral Motor/Sensory Function: Other (comment) (unable to fully assess)   Ice Chips Ice chips: Within functional limits Presentation: Spoon   Thin Liquid Thin Liquid: Impaired Presentation: Cup;Straw;Spoon Pharyngeal  Phase Impairments: Cough - Delayed    Nectar Thick Nectar Thick Liquid: Not tested   Honey Thick Honey Thick Liquid: Not tested   Puree Puree: Within functional limits Presentation: Spoon   Solid     Solid: Not tested     Ellwood Dense, [place],  [place] Office Number: [phone]  Acie Fredrickson 01/16/2021,12:35 PM

## 2021-01-16 NOTE — Progress Notes (Addendum)
Patient ID: [name], male   DOB: 1951/09/14, 70 y.o.   MRN: [id number]     Advanced Heart Failure Rounding Note  PCP-Cardiologist: No primary care provider on file.   Subjective:    4/20 Impella removed. MRI brain with numerous micro embolic infarctions. Off pressors.  4/21 lasix drip stopped.  4/22 started CVVH 4/24 CVVH stopped. Failed extubation. Re-intubated.   Now extubated on room air.   Off diuretics.  Creatinine trending up to 3.9. BUN 113    Hgb 9  Nods head. Nonverbal.    Objective:   Weight Range: 91.2 kg Body mass index is 26.53 kg/m.   Vital Signs:   Temp:  [97.6 F (36.4 C)-98.3 F (36.8 C)] 97.6 F (36.4 C) (04/27 0400) Pulse Rate:  [74-102] 88 (04/27 0600) Resp:  [15-26] 23 (04/27 0600) BP: (125-173)/(53-82) 157/74 (04/27 0600) SpO2:  [98 %-100 %] 98 % (04/27 0600) Arterial Line BP: (137-152)/(58-66) 144/59 (04/26 1000) FiO2 (%):  [40 %] 40 % (04/26 0748) Last BM Date: 01/15/21  Weight change: Filed Weights   01/13/21 0359 01/14/21 0500 01/15/21 0500  Weight: 94.8 kg 91.6 kg 91.2 kg    Intake/Output:   Intake/Output Summary (Last 24 hours) at 01/16/2021 0713 Last data filed at 01/16/2021 0600 Gross per 24 hour  Intake 1231.24 ml  Output 3125 ml  Net -1893.76 ml      Physical Exam  CVP 2  General:   No resp difficulty. HD catheter in place.  HEENT: normal Neck: supple. no JVD. Carotids 2+ bilat; no bruits. No lymphadenopathy or thryomegaly appreciated. Cor: PMI nondisplaced. Regular rate & rhythm. No rubs, gallops or murmurs. Lungs: clear on room air.  Abdomen: soft, nontender, nondistended. No hepatosplenomegaly. No bruits or masses. Good bowel sounds. Extremities: no cyanosis, clubbing, rash, edema Neuro: Nods head. MAE weak   Telemetry   SR 70-80s personally reviewed.   Labs    CBC Recent Labs    01/15/21 0333 01/15/21 0334 01/16/21 0448  WBC 16.5*  --  14.4*  HGB 8.7* 8.2* 9.0*  HCT 26.5* 24.0* 28.2*  MCV  89.8  --  92.8  PLT 272  --  185   Basic Metabolic Panel Recent Labs    01/14/21 1702 01/15/21 0333 01/15/21 0334 01/15/21 1641 01/16/21 0448  NA 140 139   < > 142 144  K 3.6 3.3*   < > 3.4* 3.3*  CL 99 101  --  103 104  CO2 24 24  --  25 25  GLUCOSE 169* 156*  --  130* 136*  BUN 90* 105*  --  110* 113*  CREATININE 3.26* 3.50*  --  3.66* 3.91*  CALCIUM 7.9* 8.1*  --  8.2* 8.7*  MG  --  2.6*  --   --  2.5*  PHOS 5.1* 6.4*  --   --   --    < > = values in this interval not displayed.   Liver Function Tests Recent Labs    01/14/21 1702 01/15/21 0333  ALBUMIN 2.4* 2.3*   No results for input(s): LIPASE, AMYLASE in the last 72 hours. Cardiac Enzymes No results for input(s): CKTOTAL, CKMB, CKMBINDEX, TROPONINI in the last 72 hours.  BNP: BNP (last 3 results) No results for input(s): BNP in the last 8760 hours.  ProBNP (last 3 results) No results for input(s): PROBNP in the last 8760 hours.   D-Dimer No results for input(s): DDIMER in the last 72 hours. Hemoglobin A1C No results for input(s):  HGBA1C in the last 72 hours. Fasting Lipid Panel Recent Labs    01/14/21 0450  TRIG 61   Thyroid Function Tests No results for input(s): TSH, T4TOTAL, T3FREE, THYROIDAB in the last 72 hours.  Invalid input(s): FREET3  Other results:   Imaging    No results found.   Medications:     Scheduled Medications: . aspirin  81 mg Per Tube Daily  . Chlorhexidine Gluconate Cloth  6 each Topical Daily  . feeding supplement (PROSource TF)  45 mL Per Tube TID  . fiber  1 packet Per Tube BID  . heparin injection (subcutaneous)  5,000 Units Subcutaneous Q8H  . insulin aspart  2-6 Units Subcutaneous Q4H  . ipratropium-albuterol  3 mL Nebulization Q4H  . mouth rinse  15 mL Mouth Rinse BID  . modafinil  100 mg Per Tube Daily  . pantoprazole (PROTONIX) IV  40 mg Intravenous Q12H  . polyethylene glycol  17 g Per Tube BID  . rosuvastatin  10 mg Per Tube Daily  . sodium  chloride flush  10-40 mL Intracatheter Q12H  . sodium chloride flush  3 mL Intravenous Q12H  . ticagrelor  90 mg Per Tube BID    Infusions: . sodium chloride 10 mL/hr at 01/15/21 2248  . dextrose 40 mL/hr at 01/15/21 2249  . feeding supplement (VITAL 1.5 CAL) Stopped (01/15/21 1100)    PRN Medications: sodium chloride, sodium chloride, acetaminophen (TYLENOL) oral liquid 160 mg/5 mL, dextrose, fentaNYL (SUBLIMAZE) injection, LORazepam, ondansetron (ZOFRAN) IV, sodium chloride flush, sodium chloride flush    Assessment/Plan   1. CAD/ Acute Inferior STEMI - emergent cath w/ pRCA occlusion s/p PCI + DES - Initial angiography showed sluggish flow in LAD and LCX>>improved post impella placement  - Echo LVEF 20-25%, RV moderately reduced - DAPT w/ ASA + Brilinta . Hgb 9 today. No obvious bleeding.  - Crestor 10 per tube ?  -Will need swallow evalu.   2. VF Arrest - in setting of acute MI, now s/p revascularization  - no further ectopy on tele. Amio stopped - keep K > 4.0 and Mg > 2.0  - EEG with diffuse encephalopathy but no seizure activity. - MRI brain micro-emboli.   - Following commands extremely weak.  -  3. Shock, Combination Cardiogenic + Hemorrhagic  - Acute MI + developed severe hemorrhagic shock due to bleeding at left groin Impella Transfused 4U PRBCs . - Impella out 4/20  - off pressors. Co-ox 71.5%  4. Acute Systolic Heart Failure>>Cardiogenic Shock  - ICM Echo LVEF 20-25%, RV moderately reduced - Impella out 4/20. Off all pressors. Co-ox 70% - CVVH stopped 4/24.  - No bb with shock.  - No spiro/arni/dig with AKI.  - CVP low.  -Add 12.5 mg hydralazine tid  and imdur 15 mg daily.   5. Acute hypoxic respiratory failure - 4/24 failed extubation trial.  - Extubated and now room air with stable sats.  - CCM following   5. Nonoliguric AKI - due to ATN/shock - CVVH begun 4/22 and stopped 4/24  - Creatinine/BUN rising. Made 2900 cc urine.  - Nephrology  appreciated.   6. Hypokalemia/hyperkalemia - supp K   7. Leukocytosis -WBC 14.4  -Cultures NGTD   8. DM2 - off insulin gtt - now on levimir and SSI  9. Encephalopathy -->uremia, acute embolic strokes.  - post cardiac arrest - head CT negative - EEG w/ generalized periodic discharges with triphasic morphology + moderate to severe diffuse encephalopathy.  - ?  cefepime toxicity +/- anoxic /hypoxic brain injury. Now off cefepime - neurology following   - MRI- numerous micro embolic infarctions . No hemorrhage or mass.  - On keppra - CVVH begun 4/22 for ?uremia contribution to encephalopathy. Stopped 4/24.  - Consult PT/OT/Speech.   10. Acute blood loss anemia  -Received 1u on 4/25.   - Hgb stable 9.   Improving. Consult PT/OT/Speech.   Length of Stay: [place], NP  7:13 AM   Patient seen and examined with the above-signed Advanced Practice Provider and/or Housestaff. I personally reviewed laboratory data, imaging studies and relevant notes. I independently examined the patient and formulated the important aspects of the plan. I have edited the note to reflect any of my changes or salient points. I have personally discussed the plan with the patient and/or family.  Extubated yesterday. Awake. Will follow some commands. Nonverbal.  Off [pressors. SBP 140-150. CVP low, BUN and Cr starting to level off  General:  Sitting up in bed No resp difficulty nonverbal HEENT: normal Neck: supple. no JVD. Carotids 2+ bilat; no bruits. No lymphadenopathy or thryomegaly appreciated. Cor: PMI nondisplaced. Regular rate & rhythm. No rubs, gallops or murmurs. Lungs: clear Abdomen: soft, nontender, nondistended. No hepatosplenomegaly. No bruits or masses. Good bowel sounds. Extremities: no cyanosis, clubbing, rash, tr edema Neuro: awake alert weak   Now extubated. Hemodynamically stable. Neuro status improved but tenuous. Hard to know if uremia contributing. Would continue supportive  care. If BUN climbing tomorrow would resume CVVHD for a day or two. PT/OT to see. Start low dose hydral/imdur. Do not let SBP go < 120.   Glori Bickers, MD  9:25 AM

## 2021-01-16 NOTE — Progress Notes (Signed)
Physical Therapy Treatment Patient Details Name: [name] MRN: [id number] DOB: 1951-05-20 Today's Date: 01/16/2021    History of Present Illness Pt is 70 yo admitted 4/14 with chest pain and STEMI with bradycardia and hypotension. Pt taken for urgent cath with temporary pacer placed but pt decompensated to cardiac arrest with intubation and CPR. Stent placed and pt with further complication of cardiogenic shock with Impella placed. 4/18 seizure like activity, Impella removed 4/20, 4/20 MRI with scattered micro embolic infarctions. Extubated 4/24 and reintubated 2 hrs later. Extubated 4/26. PMHx: HTN, HLD, CAD    PT Comments    Pt showing good improvement given warm up exercise and sitting EOB stimulating truncal activation which led to better pt focus and following of direction.  Emphasis on transitions, sitting balance/activation, sit to stands and transfer to the chair.    Follow Up Recommendations  CIR     Equipment Recommendations  Wheelchair cushion (measurements PT);3in1 (PT);Wheelchair (measurements PT);Hospital bed    Recommendations for Other Services Rehab consult     Precautions / Restrictions Precautions Precautions: Fall    Mobility  Bed Mobility Overal bed mobility: Needs Assistance Bed Mobility: Supine to Sit     Supine to sit: Max assist;+2 for physical assistance     General bed mobility comments: pt did not yet show ability to assist significantly upon initial transition to EOB.  Assist pt's trunk up and forward with scoot to EOB.    Transfers Overall transfer level: Needs assistance   Transfers: Sit to/from Stand;Stand Pivot Transfers Sit to Stand: Mod assist;+2 physical assistance Stand pivot transfers: Mod assist;Max assist;+2 safety/equipment       General transfer comment: cued for direction, assisted bilaterally with L hand on chair back. pt follow tactile and vC's to "stand" x2 to attain full upright posture. pt able to pivot his feet  with significant w/shift assist to move to the chair.  Ambulation/Gait             General Gait Details: unable today   Stairs             Wheelchair Mobility    Modified Rankin (Stroke Patients Only) Modified Rankin (Stroke Patients Only) Pre-Morbid Rankin Score: No symptoms Modified Rankin: Severe disability     Balance Overall balance assessment: Needs assistance Sitting-balance support: Feet supported;Single extremity supported;Bilateral upper extremity supported Sitting balance-Leahy Scale: Poor Sitting balance - Comments: worked at EOB x10 min working on truncal activation, then isolated trunk movement, pt extending generally better than moving forward with abs.  Worked on upright sitting to command with pt sitting up in attension (submaximally) on cue.   Standing balance support: Single extremity supported;Bilateral upper extremity supported Standing balance-Leahy Scale: Poor Standing balance comment: upright stance with bil moderate assist.                            Cognition Arousal/Alertness: Awake/alert Behavior During Therapy: WFL for tasks assessed/performed Overall Cognitive Status: Impaired/Different from baseline                   Orientation Level: Situation;Time Current Attention Level: Sustained   Following Commands: Follows one step commands with increased time (and after work to get pt to focus)   Awareness: Intellectual Problem Solving: Slow processing;Decreased initiation;Difficulty sequencing;Requires verbal cues;Requires tactile cues        Exercises Other Exercises Other Exercises: warm up AA/PROM to all 4 extremities prior to mobility..    General  Comments General comments (skin integrity, edema, etc.): VSS on RA      Pertinent Vitals/Pain Pain Assessment: Faces Faces Pain Scale: No hurt Pain Intervention(s): Monitored during session    Home Living                      Prior Function             PT Goals (current goals can now be found in the care plan section) Acute Rehab PT Goals Patient Stated Goal: unable PT Goal Formulation: Patient unable to participate in goal setting Time For Goal Achievement: 01/29/21 Potential to Achieve Goals: Good Progress towards PT goals: Progressing toward goals    Frequency    Min 4X/week      PT Plan Current plan remains appropriate    Co-evaluation              AM-PAC PT "6 Clicks" Mobility   Outcome Measure  Help needed turning from your back to your side while in a flat bed without using bedrails?: A Lot Help needed moving from lying on your back to sitting on the side of a flat bed without using bedrails?: A Lot Help needed moving to and from a bed to a chair (including a wheelchair)?: A Lot Help needed standing up from a chair using your arms (e.g., wheelchair or bedside chair)?: A Lot Help needed to walk in hospital room?: Total Help needed climbing 3-5 steps with a railing? : Total 6 Click Score: 10    End of Session   Activity Tolerance: Patient tolerated treatment well Patient left: in chair;with call bell/phone within reach;with chair alarm set (on lift pad.) Nurse Communication: Mobility status PT Visit Diagnosis: Unsteadiness on feet (R26.81);Muscle weakness (generalized) (M62.81);Hemiplegia and hemiparesis Hemiplegia - Right/Left: Right Hemiplegia - caused by: Cerebral infarction     Time: 8756-4332 PT Time Calculation (min) (ACUTE ONLY): 30 min  Charges:  $Therapeutic Activity: 8-22 mins $Neuromuscular Re-education: 8-22 mins                     01/16/2021  Ginger Carne., PT Acute Rehabilitation Services [phone]  (pager) [phone]  (office)   Tessie Fass Breon Rehm 01/16/2021, 5:24 PM

## 2021-01-16 NOTE — Procedures (Signed)
Cortrak  Person Inserting Tube:  Dameion Briles, RD Tube Type:  Cortrak - 43 inches Tube Location:  Right nare Initial Placement:  Stomach Secured by: Bridle Technique Used to Measure Tube Placement:  Documented cm marking at nare/ corner of mouth Cortrak Secured At:  67 cm    Cortrak Tube Team Note:  Consult received to place a Cortrak feeding tube.   No x-ray is required. RN may begin using tube.   If the tube becomes dislodged please keep the tube and contact the Cortrak team at [email or web address] (password [email or web address]) for replacement.  If after hours and replacement cannot be delayed, place a NG tube and confirm placement with an abdominal x-ray.     Alyda Megna MS, RDN, LDN, CNSC Registered Dietitian III Clinical Nutrition RD Pager and On-Call Pager Number Located in Amion     

## 2021-01-16 NOTE — Progress Notes (Signed)
Inpatient Rehab Admissions Coordinator Note:   Per PT/OT recommendations, pt was screened for CIR candidacy by Shann Medal, PT, DPT.  At this time pt not currently at a level to tolerate CIR, but I will follow for 1-2 more therapy sessions to see if he progresses in that direction.  Please contact me with questions.   Shann Medal, PT, DPT [phone] 01/16/21 9:22 AM

## 2021-01-16 NOTE — Progress Notes (Signed)
Nutrition Follow-up  DOCUMENTATION CODES:   Not applicable  INTERVENTION:   Initiate tube feeding via Cortrak: Osmolite 1.5 at 60 ml/h (1440 ml per day) Prosource TF 90 ml BID  Provides 2320 kcal, 134 gm protein, 1094 ml free water daily   NUTRITION DIAGNOSIS:   Inadequate oral intake related to acute illness as evidenced by NPO status. Ongoing.   GOAL:   Patient will meet greater than or equal to 90% of their needs Met with TF.   MONITOR:   TF tolerance  REASON FOR ASSESSMENT:   Ventilator    ASSESSMENT:   70 yo male admitted with STEMI with cardiogenic shock, cardiac arrest and underwent PCI with stent to RCA, Impella placement  Spoke with RN. Pt tolerating extubation. Failed SLP eval; Cortrak placed today. Pt does nod head to some questions but not all.  Per renal plan to monitor closely for HD needs.  Per MD pt with anoxic brain injury but having some improvements in exam. Pt with bilateral strokes and multiple microembolic infarction son MRI.   4/22-4/24 CVVHD  4/26 extubated 4/27 cortrak placed; tip gastric   Medications reviewed and include: nutrisource fiber BID, novolog, protonix, miralax  Labs reviewed: K+ 3.3, BUN: 113, Cr: 3.91 CBG's: 118-132  UOP: 2900 ml  I&O: - 10.7 L  NUTRITION - FOCUSED PHYSICAL EXAM:  Flowsheet Row Most Recent Value  Orbital Region No depletion  Upper Arm Region No depletion  Thoracic and Lumbar Region No depletion  Buccal Region No depletion  Temple Region No depletion  Clavicle Bone Region No depletion  Clavicle and Acromion Bone Region No depletion  Scapular Bone Region No depletion  Dorsal Hand No depletion  Patellar Region No depletion  Anterior Thigh Region No depletion  Posterior Calf Region Mild depletion  Edema (RD Assessment) Moderate  Hair Reviewed  Eyes Reviewed  Mouth Unable to assess  Skin Reviewed  Nails Reviewed       Diet Order:   Diet Order            Diet NPO time specified  Diet  effective now                 EDUCATION NEEDS:   Not appropriate for education at this time  Skin:  Skin Assessment: Reviewed RN Assessment  Last BM:  225 ml via rectal tube  Height:   Ht Readings from Last 1 Encounters:  01/13/21 6' 1"  (1.854 m)    Weight:   Wt Readings from Last 1 Encounters:  01/15/21 91.2 kg    Ideal Body Weight:     BMI:  Body mass index is 26.53 kg/m.  Estimated Nutritional Needs:   Kcal:  2200-2400  Protein:  125-140 grams  Fluid:  >2 L/day  Lockie Pares., RD, LDN, CNSC See AMiON for contact information

## 2021-01-16 NOTE — Evaluation (Signed)
Occupational Therapy Evaluation Patient Details Name: [name] MRN: [id number] DOB: 01-09-1951 Today's Date: 01/16/2021    History of Present Illness Pt is 70 yo admitted 4/14 with chest pain and STEMI with bradycardia and hypotension. Pt taken for urgent cath with temporary pacer placed but pt decompensated to cardiac arrest with intubation and CPR. Stent placed and pt with further complication of cardiogenic shock with Impella placed. 4/18 seizure like activity, Impella removed 4/20, 4/20 MRI with scattered micro embolic infarctions. Extubated 4/24 and reintubated 2 hrs later. Extubated 4/26. PMHx: HTN, HLD, CAD   Clinical Impression   Per chart review: PTA patient lived alone and was independent with all care and mobility.  The patient was admitted for the diagnosis above.  Currently he is dependent with all care and mobility.  He is nodding appropriately to questions with increased time, but remains nonverbal.  OT to continue efforts in the acute setting to maximize his functional status for an eventual transition to more intensive rehab.  CIR has been recommended.      Follow Up Recommendations  CIR    Equipment Recommendations  Wheelchair cushion (measurements OT);Wheelchair (measurements OT);Hospital bed    Recommendations for Other Services       Precautions / Restrictions Precautions Precautions: Fall Restrictions Weight Bearing Restrictions: No      Mobility Bed Mobility Overal bed mobility: Needs Assistance Bed Mobility: Rolling Rolling: Total assist           Patient Response: Flat affect  Transfers                      Balance                                           ADL either performed or assessed with clinical judgement   ADL Overall ADL's : Needs assistance/impaired Eating/Feeding: Total assistance   Grooming: Total assistance;Bed level   Upper Body Bathing: Total assistance;Bed level   Lower Body Bathing: Bed  level;Total assistance       Lower Body Dressing: Total assistance;Bed level   Toilet Transfer: Total assistance;+2 for physical assistance   Toileting- Clothing Manipulation and Hygiene: Maximal assistance;Bed level       Functional mobility during ADLs: Total assistance       Vision   Additional Comments: Patient is making good eye contact and tracking.     Perception     Praxis      Pertinent Vitals/Pain Pain Assessment: Faces Faces Pain Scale: No hurt Pain Intervention(s): Monitored during session     Hand Dominance Right   Extremity/Trunk Assessment Upper Extremity Assessment RUE Deficits / Details: ROM WFL,  Gross MMT assessment: finger flex 3/5, finger ext 0/5; wrist 0/5; elbow 1/5; shoulder 1/5; Very weak RUE Sensation: decreased light touch RUE Coordination: decreased fine motor;decreased gross motor LUE Deficits / Details: ROM WFL; Gross MMT: 2-3/5 throughout LUE Sensation: WNL LUE Coordination: decreased fine motor;decreased gross motor   Lower Extremity Assessment Lower Extremity Assessment: Defer to PT evaluation       Communication Communication Communication: Expressive difficulties   Cognition Arousal/Alertness: Awake/alert Behavior During Therapy: WFL for tasks assessed/performed Overall Cognitive Status: Impaired/Different from baseline                         Following Commands: Follows one step commands inconsistently;Follows one step  commands with increased time     Problem Solving: Slow processing;Decreased initiation;Difficulty sequencing;Requires verbal cues;Requires tactile cues     General Comments       Exercises     Shoulder Instructions      Home Living Family/patient expects to be discharged to:: Skilled nursing facility Living Arrangements: Alone Available Help at Discharge: Family;Friend(s);Available PRN/intermittently Type of Home: House Home Access: Level entry                     Home  Equipment: None          Prior Functioning/Environment Level of Independence: Independent                 OT Problem List: Decreased strength;Decreased range of motion;Decreased activity tolerance;Impaired balance (sitting and/or standing);Decreased coordination;Increased edema      OT Treatment/Interventions: Self-care/ADL training;Therapeutic exercise;Neuromuscular education;DME and/or AE instruction;Balance training;Patient/family education;Therapeutic activities;Splinting    OT Goals(Current goals can be found in the care plan section) Acute Rehab OT Goals Patient Stated Goal: unable OT Goal Formulation: Patient unable to participate in goal setting Time For Goal Achievement: 01/30/21 Potential to Achieve Goals: Fair ADL Goals Pt Will Perform Grooming: with mod assist;bed level Pt Will Perform Upper Body Bathing: with mod assist;bed level Pt Will Perform Upper Body Dressing: with mod assist;bed level Additional ADL Goal #1: Roll side to side with Mod A and use of SR's for increased independence with toileting Additional ADL Goal #2: Tolerate sitting at the edge of the bed for up to 1 minute with Mod A of one to increase ADL independence.  OT Frequency: Min 2X/week   Barriers to D/C:    none noted       Co-evaluation              AM-PAC OT "6 Clicks" Daily Activity     Outcome Measure Help from another person eating meals?: Total Help from another person taking care of personal grooming?: Total Help from another person toileting, which includes using toliet, bedpan, or urinal?: Total Help from another person bathing (including washing, rinsing, drying)?: Total Help from another person to put on and taking off regular upper body clothing?: Total Help from another person to put on and taking off regular lower body clothing?: Total 6 Click Score: 6   End of Session Nurse Communication: Need for lift equipment  Activity Tolerance: Patient tolerated treatment  well Patient left: in bed;with call bell/phone within reach  OT Visit Diagnosis: Hemiplegia and hemiparesis;Cognitive communication deficit (R41.841);Muscle weakness (generalized) (M62.81);Other abnormalities of gait and mobility (R26.89) Symptoms and signs involving cognitive functions: Cerebral infarction Hemiplegia - Right/Left: Right Hemiplegia - dominant/non-dominant: Dominant                Time: 8315-1761 OT Time Calculation (min): 18 min Charges:  OT General Charges $OT Visit: 1 Visit OT Evaluation $OT Eval Moderate Complexity: 1 Mod  01/16/2021  Rich, OTR/L  Acute Rehabilitation Services  Office:  [phone]   Metta Clines 01/16/2021, 9:04 AM

## 2021-01-16 NOTE — Evaluation (Addendum)
Speech Language Pathology Evaluation Patient Details Name: [name] MRN: [id number] DOB: Nov 10, 1950 Today's Date: 01/16/2021 Time: 6440-3474 SLP Time Calculation (min) (ACUTE ONLY): 15 min  Problem List:  Patient Active Problem List   Diagnosis Date Noted  . Cardiogenic shock ([place]) 01/03/2021  . Atypical chest pain 10/25/2019  . Hyperglycemia 10/12/2019  . Coronary artery calcification seen on CAT scan 06/15/2019  . Nodule of lower lobe of left lung 06/15/2019  . Tremor of right hand 04/24/2017  . Numbness and tingling of both legs 04/24/2017  . Bilateral leg edema 06/05/2015  . Chronic neck pain 06/05/2015  . Plantar fasciitis 02/01/2014  . Visual floaters 04/04/2013  . Subjective vision disturbance, left 04/04/2013  . 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 MELANOMA, HX OF 06/01/2009  . NECK PAIN 09/11/2008  . CELLULITIS AND ABSCESS OF LEG EXCEPT FOOT 04/26/2008  . ERECTILE DYSFUNCTION 10/12/2007  . Hyperlipemia, mixed 04/12/2007  . Anxiety state 04/12/2007  . Essential hypertension 04/12/2007  . GERD 04/12/2007   Past Medical History:  Past Medical History:  Diagnosis Date  . Anxiety   . Arthritis   . Cancer ([place]) 2010   basal cell ca, head  . Cataract    bilateral  . Depression   . ED (erectile dysfunction)   . GERD (gastroesophageal reflux disease)   . Hyperlipidemia   . Hypertension   . Kidney stone 07-29-11   passed   . 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 Vitreous floater    left eye, sees Dr. Dawna [facility] at [facility]    Past Surgical History:  Past Surgical History:  Procedure Laterality Date  . BASAL CELL CARCINOMA EXCISION     removed from scalp  . COLONOSCOPY  07/03/2017   per Dr. Havery Moros, sessile serrated polyp, repeat in 5 yrs   . HERNIA REPAIR    . LEFT HEART CATH AND CORONARY ANGIOGRAPHY N/A 01/03/2021   Procedure: LEFT HEART CATH AND CORONARY ANGIOGRAPHY;  Surgeon: Troy Sine, MD;  Location: [facility];  Service: Cardiovascular;  Laterality: N/A;  . RIGHT HEART CATH N/A 01/03/2021   Procedure: RIGHT HEART CATH;  Surgeon: Jolaine Artist, MD;  Location: [facility];  Service: Cardiovascular;  Laterality: N/A;   HPI:  Pt is 70 y.o. with PMH of  HTN, HLD, CAD, admitted with chest pain and STEMI with bradycardia and hypotension. Pt taken for urgent cath with temporary pacer placed but pt decompensated to cardiac arrest with intubation and CPR. Stent placed and pt with further complication of cardiogenic shock with Impella placed. 4/18 seizure like activity, Impella removed 4/20, 4/20 MRI with scattered micro embolic infarctions. ETT 4/14-4/24 and reintubated 2 hrs later. Extubated 4/26. EEG (4/23) revealed moderate diffuse encephalopathy, nonspecific etiology. Chest xray (4/24) revealed:"Bibasilar opacities are mildly worsened in the interval worrisome for pneumonia". Failed [facility] Swallow Screen 2/26.   Assessment / Plan / Recommendation Clinical Impression  Pt presents with cognitive and receptive/expressive language deficits marked by impaired orientation, comprehension of yes/no questions and absence of phonation/initation of verbal expression. Unable to achieve phonation despite max verbal and demonstration cues and question impact of prolonged intubation vs language deficits. After modeling use of head nod/shake to communicate yes/no response, pt exhibited 25% accuracy in response to basic biographical questions and 0% accuracy when given questions related to immediate environment. He required frequent verbal cues and gestural models after each question to encourage response and redirect attention to task. Basic commands  to navigate to R visual field and look at different items in room inconsistently followed, but improved with repetition and simplification of command. Attempted recepetive naming task with eye gaze to objects presented in field of two, however attention was fleeting and  responses inconsistent. No verbal expression elicited during assessement even when modeling automatic speech tasks. Recommend SLP to f/u for basic cognitive and language treatment to improve functional communication.    SLP Assessment  SLP Recommendation/Assessment: Patient needs continued Speech Lanaguage Pathology Services SLP Visit Diagnosis: Aphonia (R49.1);Cognitive communication deficit (R41.841)    Follow Up Recommendations  Inpatient Rehab    Frequency and Duration min 2x/week  2 weeks      SLP Evaluation Cognition  Overall Cognitive Status: Impaired/Different from baseline Arousal/Alertness: Awake/alert Orientation Level: Oriented to person;Disoriented to place Attention: Sustained;Selective;Focused Focused Attention: Appears intact Sustained Attention: Impaired Sustained Attention Impairment: Verbal basic;Functional basic Selective Attention: Impaired Selective Attention Impairment: Verbal basic;Functional basic Awareness: Impaired Awareness Impairment: Intellectual impairment       Comprehension  Auditory Comprehension Overall Auditory Comprehension: Impaired Yes/No Questions: Impaired Basic Biographical Questions: 26-50% accurate Basic Immediate Environment Questions: 0-24% accurate Commands: Impaired One Step Basic Commands: 25-49% accurate Interfering Components: Attention EffectiveTechniques: Extra processing time;Repetition Visual Recognition/Discrimination Discrimination:  (unable to fully assess) Reading Comprehension Reading Status: Unable to assess (comment)    Expression Expression Primary Mode of Expression: Nonverbal - gestures Verbal Expression Overall Verbal Expression: Impaired Initiation: Impaired Repetition: Impaired Naming: Not tested Interfering Components: Attention Non-Verbal Means of Communication: Gestures;Eye gaze Written Expression Dominant Hand: Right Written Expression: Not tested   Oral / Motor  Oral Motor/Sensory  Function Overall Oral Motor/Sensory Function: Other (comment) (unable to fully assess) Motor Speech Overall Motor Speech:  (unable to assess)   [place], [place], [place] Office Number: [phone]  Acie Fredrickson 01/16/2021, 1:03 PM

## 2021-01-16 NOTE — Progress Notes (Signed)
PCCM:  Critical care will sign off. Please call with any questions.   [facility] 01/16/2021 12:11 PM

## 2021-01-17 DIAGNOSIS — G7281 Critical illness myopathy: Secondary | ICD-10-CM | POA: Diagnosis not present

## 2021-01-17 DIAGNOSIS — R4701 Aphasia: Secondary | ICD-10-CM | POA: Diagnosis not present

## 2021-01-17 DIAGNOSIS — I639 Cerebral infarction, unspecified: Secondary | ICD-10-CM | POA: Diagnosis not present

## 2021-01-17 DIAGNOSIS — R570 Cardiogenic shock: Secondary | ICD-10-CM | POA: Diagnosis not present

## 2021-01-17 DIAGNOSIS — R4189 Other symptoms and signs involving cognitive functions and awareness: Secondary | ICD-10-CM

## 2021-01-17 LAB — CBC
HCT: 26.1 % — ABNORMAL LOW (ref 39.0–52.0)
Hemoglobin: 8.3 g/dL — ABNORMAL LOW (ref 13.0–17.0)
MCH: 29.6 pg (ref 26.0–34.0)
MCHC: 31.8 g/dL (ref 30.0–36.0)
MCV: 93.2 fL (ref 80.0–100.0)
Platelets: 394 10*3/uL (ref 150–400)
RBC: 2.8 MIL/uL — ABNORMAL LOW (ref 4.22–5.81)
RDW: 16.2 % — ABNORMAL HIGH (ref 11.5–15.5)
WBC: 11.7 10*3/uL — ABNORMAL HIGH (ref 4.0–10.5)
nRBC: 0 % (ref 0.0–0.2)

## 2021-01-17 LAB — GLUCOSE, CAPILLARY
Glucose-Capillary: 108 mg/dL — ABNORMAL HIGH (ref 70–99)
Glucose-Capillary: 139 mg/dL — ABNORMAL HIGH (ref 70–99)
Glucose-Capillary: 141 mg/dL — ABNORMAL HIGH (ref 70–99)
Glucose-Capillary: 145 mg/dL — ABNORMAL HIGH (ref 70–99)
Glucose-Capillary: 157 mg/dL — ABNORMAL HIGH (ref 70–99)
Glucose-Capillary: 159 mg/dL — ABNORMAL HIGH (ref 70–99)
Glucose-Capillary: 168 mg/dL — ABNORMAL HIGH (ref 70–99)

## 2021-01-17 LAB — BASIC METABOLIC PANEL
Anion gap: 14 (ref 5–15)
Anion gap: 9 (ref 5–15)
BUN: 120 mg/dL — ABNORMAL HIGH (ref 8–23)
BUN: 119 mg/dL — ABNORMAL HIGH (ref 8–23)
CO2: 24 mmol/L (ref 22–32)
CO2: 24 mmol/L (ref 22–32)
Calcium: 8.7 mg/dL — ABNORMAL LOW (ref 8.9–10.3)
Calcium: 8.5 mg/dL — ABNORMAL LOW (ref 8.9–10.3)
Chloride: 112 mmol/L — ABNORMAL HIGH (ref 98–111)
Chloride: 118 mmol/L — ABNORMAL HIGH (ref 98–111)
Creatinine, Ser: 3.71 mg/dL — ABNORMAL HIGH (ref 0.61–1.24)
Creatinine, Ser: 3.38 mg/dL — ABNORMAL HIGH (ref 0.61–1.24)
GFR, Estimated: 17 mL/min — ABNORMAL LOW (ref >60)
GFR, Estimated: 19 mL/min — ABNORMAL LOW (ref >60)
Glucose, Bld: 124 mg/dL — ABNORMAL HIGH (ref 70–99)
Glucose, Bld: 169 mg/dL — ABNORMAL HIGH (ref 70–99)
Potassium: 3.3 mmol/L — ABNORMAL LOW (ref 3.5–5.1)
Potassium: 4.1 mmol/L (ref 3.5–5.1)
Sodium: 150 mmol/L — ABNORMAL HIGH (ref 135–145)
Sodium: 151 mmol/L — ABNORMAL HIGH (ref 135–145)

## 2021-01-17 LAB — MAGNESIUM: Magnesium: 2.7 mg/dL — ABNORMAL HIGH (ref 1.7–2.4)

## 2021-01-17 LAB — COOXEMETRY PANEL
Carboxyhemoglobin: 1.9 % — ABNORMAL HIGH (ref 0.5–1.5)
Methemoglobin: 1.1 % (ref 0.0–1.5)
O2 Saturation: 73.7 %
Total hemoglobin: 8.8 g/dL — ABNORMAL LOW (ref 12.0–16.0)

## 2021-01-17 MED ORDER — HYDRALAZINE HCL 50 MG PO TABS
25.0000 mg | ORAL_TABLET | Freq: Three times a day (TID) | ORAL | Status: DC
  Administered 2021-01-17 – 2021-01-19 (×6): 25 mg
  Filled 2021-01-17 (×7): qty 1

## 2021-01-17 MED ORDER — ISOSORBIDE DINITRATE 10 MG PO TABS: 10.0000 mg | ORAL_TABLET | Freq: Three times a day (TID) | ORAL | Status: DC

## 2021-01-17 MED ORDER — FREE WATER
400.0000 mL | Freq: Four times a day (QID) | Status: DC
  Administered 2021-01-17 – 2021-01-19 (×8): 400 mL

## 2021-01-17 MED ORDER — PANTOPRAZOLE SODIUM 40 MG PO PACK
40.0000 mg | PACK | Freq: Every day | ORAL | Status: DC
  Administered 2021-01-17 – 2021-01-23 (×7): 40 mg
  Filled 2021-01-17 (×8): qty 20

## 2021-01-17 MED ORDER — FREE WATER
200.0000 mL | Status: DC
  Administered 2021-01-17: 200 mL

## 2021-01-17 MED ORDER — HYDRALAZINE HCL 25 MG PO TABS: 25.0000 mg | ORAL_TABLET | Freq: Three times a day (TID) | ORAL | Status: DC

## 2021-01-17 MED ORDER — POTASSIUM CHLORIDE 20 MEQ PO PACK
40.0000 meq | PACK | Freq: Four times a day (QID) | ORAL | Status: AC
  Administered 2021-01-17 (×2): 40 meq
  Filled 2021-01-17 (×2): qty 2

## 2021-01-17 MED ORDER — ISOSORBIDE DINITRATE 10 MG PO TABS
10.0000 mg | ORAL_TABLET | Freq: Three times a day (TID) | ORAL | Status: DC
  Administered 2021-01-17 – 2021-01-19 (×9): 10 mg
  Filled 2021-01-17 (×9): qty 1

## 2021-01-17 NOTE — Progress Notes (Addendum)
Patient ID: [name], male   DOB: 01-Jun-1951, 70 y.o.   MRN: [id number]     Advanced Heart Failure Rounding Note  PCP-Cardiologist: No primary care provider on file.   Subjective:    4/20 Impella removed. MRI brain with numerous micro embolic infarctions. Off pressors.  4/21 lasix drip stopped.  4/22 started CVVH 4/24 CVVH stopped. Failed extubation. Re-intubated.  4/26 Extubated   Remains on room air. .    Creatinine trending 3.9>3.7, BUN 117>120   Hgb 8.3   Nods head. Non verbal.    Objective:   Weight Range: 87.8 kg Body mass index is 25.54 kg/m.   Vital Signs:   Temp:  [98.1 F (36.7 C)-98.9 F (37.2 C)] 98.3 F (36.8 C) (04/28 0300) Pulse Rate:  [72-95] 82 (04/28 0600) Resp:  [11-23] 18 (04/28 0600) BP: (134-167)/(59-79) 149/73 (04/28 0601) SpO2:  [94 %-98 %] 97 % (04/28 0600) Weight:  [87.8 kg] 87.8 kg (04/28 0500) Last BM Date: 01/16/21 (FMS in place)  Weight change: Filed Weights   01/14/21 0500 01/15/21 0500 01/17/21 0500  Weight: 91.6 kg 91.2 kg 87.8 kg    Intake/Output:   Intake/Output Summary (Last 24 hours) at 01/17/2021 0715 Last data filed at 01/17/2021 0600 Gross per 24 hour  Intake 940 ml  Output 2775 ml  Net -1835 ml      Physical Exam  CVP 2 General: No resp difficulty.  HEENT: [place]  Neck: supple. no JVD. Carotids 2+ bilat; no bruits. No lymphadenopathy or thryomegaly appreciated. Cor: PMI nondisplaced. Regular rate & rhythm. No rubs, gallops or murmurs. Lungs: Coarse throughout.  Abdomen: soft, nontender, nondistended. No hepatosplenomegaly. No bruits or masses. Good bowel sounds. Flexi seal  Extremities: no cyanosis, clubbing, rash, edema Neuro: Opens eyes. Nods. Nonverbal. Minimal extremity movement. GU: foley    Telemetry  SR with occasional PVCs 70-90s   Labs    CBC Recent Labs    01/16/21 0448 01/17/21 0417  WBC 14.4* 11.7*  HGB 9.0* 8.3*  HCT 28.2* 26.1*  MCV 92.8 93.2  PLT 386 536   Basic  Metabolic Panel Recent Labs    01/14/21 1702 01/14/21 1702 01/15/21 0333 01/15/21 0334 01/16/21 0448 01/16/21 1947 01/17/21 0417  NA 140  --  139   < > 144 146* 150*  K 3.6  --  3.3*   < > 3.3* 3.5 3.3*  CL 99  --  101   < > 104 108 112*  CO2 24  --  24   < > 25 24 24   GLUCOSE 169*  --  156*   < > 136* 163* 124*  BUN 90*  --  105*   < > 113* 117* 120*  CREATININE 3.26*  --  3.50*   < > 3.91* 3.85* 3.71*  CALCIUM 7.9*  --  8.1*   < > 8.7* 8.6* 8.7*  MG  --    < > 2.6*  --  2.5*  --  2.7*  PHOS 5.1*  --  6.4*  --   --   --   --    < > = values in this interval not displayed.   Liver Function Tests Recent Labs    01/14/21 1702 01/15/21 0333  ALBUMIN 2.4* 2.3*   No results for input(s): LIPASE, AMYLASE in the last 72 hours. Cardiac Enzymes No results for input(s): CKTOTAL, CKMB, CKMBINDEX, TROPONINI in the last 72 hours.  BNP: BNP (last 3 results) No results for input(s): BNP in the last  8760 hours.  ProBNP (last 3 results) No results for input(s): PROBNP in the last 8760 hours.   D-Dimer No results for input(s): DDIMER in the last 72 hours. Hemoglobin A1C No results for input(s): HGBA1C in the last 72 hours. Fasting Lipid Panel No results for input(s): CHOL, HDL, LDLCALC, TRIG, CHOLHDL, LDLDIRECT in the last 72 hours. Thyroid Function Tests No results for input(s): TSH, T4TOTAL, T3FREE, THYROIDAB in the last 72 hours.  Invalid input(s): FREET3  Other results:   Imaging    No results found.   Medications:     Scheduled Medications: . aspirin  81 mg Per Tube Daily  . Chlorhexidine Gluconate Cloth  6 each Topical Daily  . feeding supplement (PROSource TF)  90 mL Per Tube BID  . fiber  1 packet Per Tube BID  . heparin injection (subcutaneous)  5,000 Units Subcutaneous Q8H  . hydrALAZINE  12.5 mg Oral Q8H  . ipratropium-albuterol  3 mL Nebulization BID  . isosorbide mononitrate  15 mg Oral Daily  . mouth rinse  15 mL Mouth Rinse BID  . modafinil  100  mg Per Tube Daily  . pantoprazole (PROTONIX) IV  40 mg Intravenous Q12H  . polyethylene glycol  17 g Per Tube BID  . rosuvastatin  10 mg Per Tube Daily  . ticagrelor  90 mg Per Tube BID    Infusions: . feeding supplement (OSMOLITE 1.5 CAL) 60 mL/hr at 01/17/21 0400    PRN Medications: sodium chloride, acetaminophen (TYLENOL) oral liquid 160 mg/5 mL, ondansetron (ZOFRAN) IV, sodium chloride flush    Assessment/Plan   1. CAD/ Acute Inferior STEMI - emergent cath w/ pRCA occlusion s/p PCI + DES - Initial angiography showed sluggish flow in LAD and LCX>>improved post impella placement  - Echo LVEF 20-25%, RV moderately reduced - DAPT w/ ASA + Brilinta . Hgb 8.3 today. No obvious bleeding.  - Crestor 10 daily   2. VF Arrest - in setting of acute MI, now s/p revascularization  -No further ectopy. Amio stopped - keep K > 4.0 and Mg > 2.0  - EEG with diffuse encephalopathy but no seizure activity. - MRI brain micro-emboli.   - Following commands extremely weak.    3. Shock, Combination Cardiogenic + Hemorrhagic  - Acute MI + developed severe hemorrhagic shock due to bleeding at left groin Impella Transfused 4U PRBCs . - Impella out 4/20  - off pressors. Co-ox 74%  4. Acute Systolic Heart Failure>>Cardiogenic Shock  - ICM Echo LVEF 20-25%, RV moderately reduced - Impella out 4/20. Off all pressors. Co-ox 74% - CVVH stopped 4/24.  - No bb with shock.  - No spiro/arni/dig with AKI.  - Increase hydralazine 25 mg tid and imdur 30 daily.   5. Acute hypoxic respiratory failure - 4/24 failed extubation trial.  - Extubated and now room air with stable sats.  - On room air.  - CCM signed off.   5. Nonoliguric AKI - due to ATN/shock - CVVH begun 4/22 and stopped 4/24  - Creatinine  3.85>3.7/BUN 117>120. Made >2500 cc urine.  - Nephrology appreciated.   6. Hypokalemia/hyperkalemia - supp K   7. Leukocytosis -WBC 11.7   -Cultures NGTD   8. DM2 - off insulin gtt - now on  levimir and SSI  9. Encephalopathy -->uremia, acute embolic strokes.  - post cardiac arrest - head CT negative - EEG w/ generalized periodic discharges with triphasic morphology + moderate to severe diffuse encephalopathy.  - ? cefepime toxicity +/-  anoxic /hypoxic brain injury. Now off cefepime - neurology following   - MRI- numerous micro embolic infarctions . No hemorrhage or mass.  - On keppra - CVVH begun 4/22 for ?uremia contribution to encephalopathy. Stopped 4/24.  - PT/OT/Speech- CIR consulted.   10. Acute blood loss anemia  -Received 1u on 4/25.   - Hgb drifting down 9>8.3  - Follow daily CBC.   CIR consulted   Length of Stay: [place], NP  7:15 AM    Patient seen and examined with the above-signed Advanced Practice Provider and/or Housestaff. I personally reviewed laboratory data, imaging studies and relevant notes. I independently examined the patient and formulated the important aspects of the plan. I have edited the note to reflect any of my changes or salient points. I have personally discussed the plan with the patient and/or family.  Awake. Will track and follow few commands. Hemodynamically stable. SCr slightly improved.   General:  Awake. No distress  HEENT: normal Neck: supple. no JVD. LIJ HD cath  Carotids 2+ bilat; no bruits. No lymphadenopathy or thryomegaly appreciated. Cor: PMI nondisplaced. Regular rate & rhythm. No rubs, gallops or murmurs. Lungs: clear Abdomen: soft, nontender, nondistended. No hepatosplenomegaly. No bruits or masses. Good bowel sounds. Extremities: no cyanosis, clubbing, rash, edema Neuro: Awake. Will track and follow few commands.  Remains non-verbal and not following commands well. Suspect significant anoxic injury. However BUN also up. But Scr starting to improve. Agree with giving some free water today. If BUN doesn't start coming down may need iHD for a day or two. Continue PT/OT. Titrate hydral and nitrates. Will need  CIR eval.   Glori Bickers, MD  9:24 AM

## 2021-01-17 NOTE — Progress Notes (Signed)
Physical Therapy Treatment Patient Details Name: Chandlor Noecker MRN: [id number] DOB: 03/12/1951 Today's Date: 01/17/2021    History of Present Illness Pt is 70 yo admitted 4/14 with chest pain and STEMI with bradycardia and hypotension. Pt taken for urgent cath with temporary pacer placed but pt decompensated to cardiac arrest with intubation and CPR. Stent placed and pt with further complication of cardiogenic shock with Impella placed. 4/18 seizure like activity, Impella removed 4/20, 4/20 MRI with scattered micro embolic infarctions. Extubated 4/24 and reintubated 2 hrs later. Extubated 4/26. PMHx: HTN, HLD, CAD    PT Comments    Pt with flat affect, no verbalizations and limited response via head nod/shake but accurate when responding. Pt with LUE movement noted with spontaneous activation with pain and assist with rolling with hand over hand assist to position. No response to command with RUE or bil LE but activation in right shoulder noted with flexion. Will continue to follow but anticipate pt will need longer term rehab and assist than CIR can provide with pt living alone with D/C recommendation updated.  Will continue to follow.   HR 84 97% on RA   Follow Up Recommendations  Supervision/Assistance - 24 hour;SNF     Equipment Recommendations  Wheelchair cushion (measurements PT);3in1 (PT);Wheelchair (measurements PT);Hospital bed    Recommendations for Other Services       Precautions / Restrictions Precautions Precautions: Fall Precaution Comments: globally aphasic, cortrak, flexiseal Restrictions Weight Bearing Restrictions: No    Mobility  Bed Mobility Overal bed mobility: Needs Assistance Bed Mobility: Rolling;Supine to Sit Rolling: Max assist         General bed mobility comments: physical assist to bend knee and position UE across chest with rolling right and LUE placed on rail pt able to assist activation to roll. Total assist to roll to left. Supine to sit  via bed egress function of bed. Total +2 to slide toward [facility]    Transfers Overall transfer level: Needs assistance   Transfers: Sit to/from Stand Sit to Stand: Max assist;+2 physical assistance;From elevated surface         General transfer comment: max +2 assist with bil knees blocked and use of pad to cradle sacrum and stand from surface. Pt maintains flexed hip/knee/trunk with rise from surface and unable to significantly assist. Max assist to shift trunk anteriorly into unsupported sitting with mod assist for sitting balance with maintained neck flexion  Ambulation/Gait             General Gait Details: unable   Stairs             Wheelchair Mobility    Modified Rankin (Stroke Patients Only)       Balance Overall balance assessment: Needs assistance Sitting-balance support: Feet supported;No upper extremity supported Sitting balance-Leahy Scale: Poor Sitting balance - Comments: mod assist with unsupported sitting grossly 4 min   Standing balance support: Bilateral upper extremity supported Standing balance-Leahy Scale: Zero                              Cognition Arousal/Alertness: Awake/alert Behavior During Therapy: Flat affect Overall Cognitive Status: Difficult to assess                     Current Attention Level: Focused   Following Commands: Follows one step commands inconsistently       General Comments: pt not responding verbally at all. head shake yes/no very  sporadically grossly 2/10 attempts. Pt with 1/5 response to commands to squeeze left hand. resistance to command for trunk elevation from surface      Exercises General Exercises - Upper Extremity Shoulder Flexion: PROM;Both;10 reps;Seated General Exercises - Lower Extremity Long Arc Quad: PROM;Both;10 reps;Seated    General Comments        Pertinent Vitals/Pain Faces Pain Scale: Hurts even more Pain Location: left groin during pericare with pt with  reflexive LUE movement Pain Descriptors / Indicators: Grimacing Pain Intervention(s): Limited activity within patient's tolerance;Monitored during session;Repositioned    Home Living                      Prior Function            PT Goals (current goals can now be found in the care plan section) Progress towards PT goals: Progressing toward goals    Frequency    Min 4X/week      PT Plan Discharge plan needs to be updated    Co-evaluation              AM-PAC PT "6 Clicks" Mobility   Outcome Measure  Help needed turning from your back to your side while in a flat bed without using bedrails?: Total Help needed moving from lying on your back to sitting on the side of a flat bed without using bedrails?: Total Help needed moving to and from a bed to a chair (including a wheelchair)?: Total Help needed standing up from a chair using your arms (e.g., wheelchair or bedside chair)?: Total Help needed to walk in hospital room?: Total Help needed climbing 3-5 steps with a railing? : Total 6 Click Score: 6    End of Session   Activity Tolerance: Patient tolerated treatment well Patient left: in bed;with call bell/phone within reach;with bed alarm set (in chair position) Nurse Communication: Mobility status PT Visit Diagnosis: Unsteadiness on feet (R26.81);Muscle weakness (generalized) (M62.81);Hemiplegia and hemiparesis Hemiplegia - Right/Left: Right Hemiplegia - dominant/non-dominant: Non-dominant Hemiplegia - caused by: Cerebral infarction     Time: 6378-5885 PT Time Calculation (min) (ACUTE ONLY): 38 min  Charges:  $Therapeutic Activity: 23-37 mins $Neuromuscular Re-education: 8-22 mins                     Javyn Havlin P, PT Acute Rehabilitation Services Pager: [phone] Office: [place] 01/17/2021, 11:12 AM

## 2021-01-17 NOTE — Consult Note (Signed)
Physical Medicine and Rehabilitation Consult   Reason for Consult: Functional deficits due to stroke/critical illness myopathy?  Referring Physician: Dr. Haroldine Laws   HPI: [name] is a 70 y.o. male with history of HTN, BCC, neuropathy, anxiety d/o-on ativan, CAD who was admitted on 01/03/21 with chest pain due to STEMI, was coded by EMS and taken to cath lab for [facility]. He was found to have inferior wall MI secondary to total proximal RCA occlusion which was treated with PCI/stent. Procedure complicated by bouts of VF requiring CPR/defibrillation, intubation,placement of temporary pacemaker as well as impella placed due to cardiogenic shock complicated by large hematoma requiring 4 units PRBC as well as pressors. Dr. Haroldine Laws following for management of cardiogenic shock with electrolyte abnormalities with shocked liver and amiodarone added for management of arrhthymias.    He continued to be lethargic but was found to have twitching of mouth  concerning for seiuzre and EEG done revealing generalized triphasic discharges question due to encephalopathy v/s anoxic/hypoxic BI. He was started on Keppra with prn ativan,  placed on LTM with recommendations to change antibiotics due to concerns of cefepime toxicity in setting of AKI.  He continued to have left mouth twitching without concomitant EEG changes but ictal-interictal continum which placed him at moderate to high risk for seizures. He developed nonoliguric renal failure with marked fluid overload and was started on CRRT briefly. MRI brain done after impella removed on 04/20 and showed numerous scattered punctate to small foci of infarct throughout the brain c/w with microembolic infarcts rather than hypoxic injury.  He failed extubation by 04/24, was reintubated and Keppra weaned off due to lethargy. Motor activity felt to be due to cefepime toxicity and to continue ativan prn. He tolerated extubation on 04/26 and noted be unable to speak  with difficulty following commands.  Renal status improving, leucocytosis resolving and ABLA being monitored. He remains NPO with aphonia, receptive/expressive language deficits as well as diffuse weakness affecing sitting balance. CIR recommended due to functional decline.    Unable to get anything from pt- per nurse, he's been this way for a few days- limited head nodding yes or no, but not always accurate- so hard to know if he's understanding the questions or not.   Review of Systems  Unable to perform ROS: Patient nonverbal     Past Medical History:  Diagnosis Date  . Anxiety   . Arthritis   . Cancer ([place]) 2010   basal cell ca, head  . Cataract    bilateral  . Depression   . ED (erectile dysfunction)   . GERD (gastroesophageal reflux disease)   . Hyperlipidemia   . Hypertension   . Kidney stone 07-29-11   passed   . 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 Vitreous floater    left eye, sees Dr. Dawna Part at [facility]     Past Surgical History:  Procedure Laterality Date  . BASAL CELL CARCINOMA EXCISION     removed from scalp  . COLONOSCOPY  07/03/2017   per Dr. Havery Moros, sessile serrated polyp, repeat in 5 yrs   . HERNIA REPAIR    . LEFT HEART CATH AND CORONARY ANGIOGRAPHY N/A 01/03/2021   Procedure: LEFT HEART CATH AND CORONARY ANGIOGRAPHY;  Surgeon: Troy Sine, MD;  Location: [facility];  Service: Cardiovascular;  Laterality: N/A;  . RIGHT HEART CATH N/A 01/03/2021   Procedure: RIGHT HEART CATH;  Surgeon: Jolaine Artist, MD;  Location: [facility];  Service: Cardiovascular;  Laterality: N/A;    Family History  Problem Relation Age of Onset  . Hypertension Mother   . Hyperlipidemia Mother   . Depression Other   . Diabetes Other   . Hyperlipidemia Other   . Hypertension Other   . Alzheimer's disease Other   . Colon cancer Neg Hx     Social History:  Per records--single and retired Pharmacist, hospital. Per reports that he has  never smoked. He has never used smokeless tobacco. Per reports he does not drink alcohol and does not use drugs.    Allergies  Allergen Reactions  . Codeine Other (See Comments)    hallucinations  . Lisinopril Cough   Medications Prior to Admission  Medication Sig Dispense Refill  . acetaminophen (TYLENOL) 325 MG tablet Take 650 mg by mouth every 6 (six) hours as needed for mild pain.    Marland Kitchen amLODipine (NORVASC) 5 MG tablet TAKE 1 TABLET BY MOUTH EVERY DAY (Patient taking differently: Take 5 mg by mouth daily.) 90 tablet 3  . cyclobenzaprine (FLEXERIL) 10 MG tablet TAKE 1 TABLET BY MOUTH AT BEDTIME (Patient taking differently: Take 10 mg by mouth at bedtime.) 90 tablet 5  . finasteride ([name]) 5 MG tablet Take 1 tablet (5 mg total) by mouth daily. 1 tablet 0  . LORazepam (ATIVAN) 2 MG tablet Take 1 tablet (2 mg total) by mouth every 6 (six) hours as needed for anxiety. 90 tablet 5  . losartan (COZAAR) 50 MG tablet TAKE 2 TABLETS BY MOUTH EVERY DAY (Patient taking differently: Take 100 mg by mouth daily.) 180 tablet 3  . magic mouthwash SOLN Take 10 mLs by mouth 3 (three) times daily as needed for mouth pain. 120 mL 0  . Melatonin 10 MG TABS Take 10 mg by mouth daily.    Marland Kitchen omeprazole (PRILOSEC) 20 MG capsule TAKE 1 CAPSULE (20 MG TOTAL) BY MOUTH 2 (TWO) TIMES DAILY BEFORE A MEAL. 60 capsule 11  . rosuvastatin (CRESTOR) 5 MG tablet TAKE 1 TABLET BY MOUTH 2-3 TIMES WEEKLY AS TOLERATED (Patient taking differently: Take 5 mg by mouth See admin instructions. Take 1 tablet by mouth 2-3 times weekly as tolerated) 36 tablet 3  . tamsulosin (FLOMAX) 0.4 MG CAPS capsule Take 0.4 mg by mouth daily.    Marland Kitchen triamcinolone cream (KENALOG) 0.1 % Apply 1 application topically 2 (two) times daily as needed (For rash).    . DULoxetine (CYMBALTA) 20 MG capsule TAKE 1 CAPSULE BY MOUTH EVERY DAY (Patient taking differently: Take 20 mg by mouth daily.) 30 capsule 11  . FLUAD 0.5 ML SUSY TO BE ADMINISTERED BY  PHARMACIST FOR IMMUNIZATION  0    Home: Home Living Family/patient expects to be discharged to:: Skilled nursing facility Living Arrangements: Alone Available Help at Discharge: Family,Friend(s),Available PRN/intermittently Type of Home: House Home Access: Level entry Home Equipment: None Additional Comments: POA-Jeff provided PLOF and home environment  Functional History: Prior Function Level of Independence: Independent Comments: Pt was very independent and walked his dog a couple miles per day. Functional Status:  Mobility: Bed Mobility Overal bed mobility: Needs Assistance Bed Mobility: Supine to Sit Rolling: Total assist Supine to sit: Max assist,+2 for physical assistance General bed mobility comments: pt did not yet show ability to assist significantly upon initial transition to EOB.  Assist pt's trunk up and forward with scoot to EOB. Transfers Overall transfer level: Needs assistance Transfers: Sit to/from [employer] Sit to Stand: Mod  assist,+2 physical assistance Stand pivot transfers: Mod assist,Max assist,+2 safety/equipment General transfer comment: cued for direction, assisted bilaterally with L hand on chair back. pt follow tactile and vC's to "stand" x2 to attain full upright posture. pt able to pivot his feet with significant w/shift assist to move to the chair. Ambulation/Gait General Gait Details: unable today    ADL: ADL Overall ADL's : Needs assistance/impaired Eating/Feeding: Total assistance Grooming: Total assistance,Bed level Upper Body Bathing: Total assistance,Bed level Lower Body Bathing: Bed level,Total assistance Lower Body Dressing: Total assistance,Bed level Toilet Transfer: Total assistance,+2 for physical assistance Toileting- Clothing Manipulation and Hygiene: Maximal assistance,Bed level Functional mobility during ADLs: Total assistance  Cognition: Cognition Overall Cognitive Status: Impaired/Different from  baseline Arousal/Alertness: Awake/alert Orientation Level: Other (comment) (UTA) Attention: Sustained,Selective,Focused Focused Attention: Appears intact Sustained Attention: Impaired Sustained Attention Impairment: Verbal basic,Functional basic Selective Attention: Impaired Selective Attention Impairment: Verbal basic,Functional basic Awareness: Impaired Awareness Impairment: Intellectual impairment Cognition Arousal/Alertness: Awake/alert Behavior During Therapy: WFL for tasks assessed/performed Overall Cognitive Status: Impaired/Different from baseline Area of Impairment: Orientation,Attention,Memory,Following commands,Awareness,Problem solving Orientation Level: Situation,Time Current Attention Level: Sustained Memory: Decreased recall of precautions,Decreased short-term memory Following Commands: Follows one step commands with increased time (and after work to get pt to focus) Awareness: Intellectual Problem Solving: Slow processing,Decreased initiation,Difficulty sequencing,Requires verbal cues,Requires tactile cues General Comments: Requiring significant time to respond; able to tell name and birthday but limited other verbal responses, did nod yes/no approprialy most of the time; unable to provide history   Blood pressure (!) 149/73, pulse 79, temperature 99.1 F (37.3 C), temperature source Oral, resp. rate 18, height 6' 1"  (1.854 m), weight 87.8 kg, SpO2 96 %. Physical Exam Vitals and nursing note reviewed.  Constitutional:      Comments: Flat affect--anxious appearing. [place] in nares. Ecchymotic scabbed area right neck.  Slightly erythematous spot on R neck Staring at interviewer, however only nodded head yes 2x- otherwise no reaction of any kind to questions/commands, sitting up slightly in bed; NAD  HENT:     Head: Normocephalic.     Comments: B/L facial droop/no smile/not following commands    Right Ear: External ear normal.     Left Ear: External ear normal.      Nose: Congestion present.     Comments: cortrak in place    Mouth/Throat:     Mouth: Mucous membranes are dry.     Pharynx: Oropharynx is clear.  Eyes:     General:        Right eye: No discharge.        Left eye: No discharge.     Conjunctiva/sclera: Conjunctivae normal.  Cardiovascular:     Rate and Rhythm: Normal rate and regular rhythm.     Heart sounds: Normal heart sounds. No murmur heard. No gallop.   Pulmonary:     Breath sounds: Rhonchi present.     Comments: Rhonchi throughout - no O2 in place- sats 95% Adequate air movement; no Wheezes or rales heard Abdominal:     Comments: Soft, NT, ND, (+)BS    Genitourinary:    Comments: Rectal tube with loose stool.  Musculoskeletal:     Cervical back: Neck supple. No rigidity.     Comments: Automatic grasp- On L 2-/5; on R- 1/5 Possibly 1/5 PF on R- hard ot tell No other participation of any kind, and appeared to be automatic.  Foot drop B/L  Skin:    General: Skin is warm and dry.  Neurological:  Mental Status: He is alert.     Comments: Mute--makes eye contact but minimal reaction noted. Global aphasia--unable to recognize name with choice of two and he did not attempt to phonate or follow simple motor motor commands without max tactile cues. Diffusely weak BLE>BUE.  Global aphasia on exam Nonverbal- no appearance of understanding to questioning/commands except nodding head head 2x to "can I examine you?"  Psychiatric:     Comments: Nonverbal, very flat affect     Results for orders placed or performed during the hospital encounter of 01/03/21 (from the past 24 hour(s))  Glucose, capillary     Status: Abnormal   Collection Time: 01/16/21 11:44 AM  Result Value Ref Range   Glucose-Capillary 120 (H) 70 - 99 mg/dL  Glucose, capillary     Status: Abnormal   Collection Time: 01/16/21  3:54 PM  Result Value Ref Range   Glucose-Capillary 123 (H) 70 - 99 mg/dL  Glucose, capillary     Status: Abnormal   Collection  Time: 01/16/21  7:43 PM  Result Value Ref Range   Glucose-Capillary 125 (H) 70 - 99 mg/dL  Basic metabolic panel     Status: Abnormal   Collection Time: 01/16/21  7:47 PM  Result Value Ref Range   Sodium 146 (H) 135 - 145 mmol/L   Potassium 3.5 3.5 - 5.1 mmol/L   Chloride 108 98 - 111 mmol/L   CO2 24 22 - 32 mmol/L   Glucose, Bld 163 (H) 70 - 99 mg/dL   BUN 117 (H) 8 - 23 mg/dL   Creatinine, Ser 3.85 (H) 0.61 - 1.24 mg/dL   Calcium 8.6 (L) 8.9 - 10.3 mg/dL   GFR, Estimated 16 (L) >60 mL/min   Anion gap 14 5 - 15  Glucose, capillary     Status: Abnormal   Collection Time: 01/17/21 12:43 AM  Result Value Ref Range   Glucose-Capillary 159 (H) 70 - 99 mg/dL  Glucose, capillary     Status: Abnormal   Collection Time: 01/17/21  3:45 AM  Result Value Ref Range   Glucose-Capillary 108 (H) 70 - 99 mg/dL  Cooxemetry Panel (carboxy, met, total hgb, O2 sat)     Status: Abnormal   Collection Time: 01/17/21  4:17 AM  Result Value Ref Range   Total hemoglobin 8.8 (L) 12.0 - 16.0 g/dL   O2 Saturation 73.7 %   Carboxyhemoglobin 1.9 (H) 0.5 - 1.5 %   Methemoglobin 1.1 0.0 - 1.5 %  Basic metabolic panel     Status: Abnormal   Collection Time: 01/17/21  4:17 AM  Result Value Ref Range   Sodium 150 (H) 135 - 145 mmol/L   Potassium 3.3 (L) 3.5 - 5.1 mmol/L   Chloride 112 (H) 98 - 111 mmol/L   CO2 24 22 - 32 mmol/L   Glucose, Bld 124 (H) 70 - 99 mg/dL   BUN 120 (H) 8 - 23 mg/dL   Creatinine, Ser 3.71 (H) 0.61 - 1.24 mg/dL   Calcium 8.7 (L) 8.9 - 10.3 mg/dL   GFR, Estimated 17 (L) >60 mL/min   Anion gap 14 5 - 15  Magnesium     Status: Abnormal   Collection Time: 01/17/21  4:17 AM  Result Value Ref Range   Magnesium 2.7 (H) 1.7 - 2.4 mg/dL  CBC     Status: Abnormal   Collection Time: 01/17/21  4:17 AM  Result Value Ref Range   WBC 11.7 (H) 4.0 - 10.5 K/uL   RBC  2.80 (L) 4.22 - 5.81 MIL/uL   Hemoglobin 8.3 (L) 13.0 - 17.0 g/dL   HCT 26.1 (L) 39.0 - 52.0 %   MCV 93.2 80.0 - 100.0 fL    MCH 29.6 26.0 - 34.0 pg   MCHC 31.8 30.0 - 36.0 g/dL   RDW 16.2 (H) 11.5 - 15.5 %   Platelets 394 150 - 400 K/uL   nRBC 0.0 0.0 - 0.2 %  Glucose, capillary     Status: Abnormal   Collection Time: 01/17/21  8:02 AM  Result Value Ref Range   Glucose-Capillary 168 (H) 70 - 99 mg/dL   No results found.   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 Does the need for close, 24 hr/day medical supervision in concert with the patient's rehab needs make it unreasonable for this patient to be served in a less intensive setting? No 2. 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 Vs encephalopathy after code x2/CPR; HTN 3. Due to bladder management, bowel management, safety, skin/wound care, disease management, medication administration, pain management and patient education, does the patient require 24 hr/day rehab nursing? Potentially 4. Does the patient require coordinated care of a physician, rehab nurse, therapy disciplines of PT, OT and SLP to address physical and functional deficits in the context of the above medical diagnosis(es)? Potentially Addressing deficits in the following areas: balance, endurance, locomotion, strength, transferring, bowel/bladder control, bathing, dressing, feeding, grooming, toileting, cognition, speech, language and swallowing 5. Can the patient actively participate in an intensive therapy program of at least 3 hrs of therapy per day at least 5 days per week? No 6. The potential for patient to make measurable gains while on inpatient rehab is poor 7. Anticipated functional outcomes upon discharge from inpatient rehab are max assist  with PT, max assist with OT, total assist with SLP. 8. Estimated rehab length of stay to reach the above functional goals is: will need SNF since unable to participate in 3  hrs/day 9. Anticipated discharge destination: Other 10. Overall Rehab/Functional Prognosis: poor  RECOMMENDATIONS: This patient's condition is appropriate for continued rehabilitative care in the following setting: SNF Patient has agreed to participate in recommended program. Potentially Note that insurance prior authorization may be required for reimbursement for recommended care.  Comment:   1. Pt currently doesn't appear to be a good inpt rehab candidate- his road is going to be a prolonged course, if he has family who can care for him 24/7 at home.  2. Pt is appropriate for SNF, unless there is major changes to his level of ability/participation change.  3. Strongly suggest prevalon vs PRAFO boots on B/L feet for B/L foot drop- would do 4 hrs on; 4 hrs off while awake and then at night-  4. Might benefit from Amantadine to wake pt up- usually used in Brain injury pt, but could possibly help.  5. Thank you for this consult.   Bary Leriche, PA-C 01/17/2021    I have personally performed a face to face diagnostic evaluation of this patient and formulated the key components of the plan.  Additionally, I have personally reviewed laboratory data, imaging studies, as well as relevant notes and concur with the physician assistant's documentation above.

## 2021-01-17 NOTE — Progress Notes (Signed)
[facility] NEPHROLOGY PROGRESS NOTE  Assessment/ Plan: Pt is a 70 y.o. yo male with history of HTN, HLD, CAD presented with chest pain found to have STEMI.  He underwent left heart cath, placement of Impella device, had V. fib arrest, mechanical ventilation for respiratory failure and AKI.  #Acute kidney injury due to cardiogenic/hemorrhagic shock.  UA bland and kidney ultrasound unremarkable.  Required CRRT from 4/22-4/24.  Initially required IV Lasix and now auto diuresing well.  Creatinine level now trending down however with elevated BUN level probably because of some dehydration.  Noted hypernatremia therefore starting free water from the tube.  No need for dialysis.  Discussed with Dr. Haroldine Laws. Plan to monitor kidney function closely, strict ins and out.    #Hypernatremia due to increase free water excretion and recovering AKI.  Starting free water from the tube.  #CAD/acute STEMI required emergent cath with stent placement.  EF 20 to 25%.  Cardiology following.  #V. fib arrest/cardiogenic shock, acute systolic CHF: Arrest in the setting of acute MI.  Impella was removed.  Now off of pressors or inotropes.  Volume status is acceptable.  #Ventilator dependent respiratory failure: He is now extubated and in room air.  #Anemia due to acute blood loss: Required multiple blood transfusion.  Transfuse as needed.  #Hypokalemia: Replete KCl.  Subjective: Seen and examined.  Urine output around 2.5 L in 24 hours.  He is awake and tired.  Only minimally following commands.  No other event. Objective Vital signs in last 24 hours: Vitals:   01/17/21 0600 01/17/21 0601 01/17/21 0803 01/17/21 0806  BP: (!) 149/73 (!) 149/73    Pulse: 82  79   Resp: 18  18   Temp:    99.1 F (37.3 C)  TempSrc:    Oral  SpO2: 97%  96%   Weight:      Height:       Weight change:   Intake/Output Summary (Last 24 hours) at 01/17/2021 0816 Last data filed at 01/17/2021 0600 Gross per 24  hour  Intake 940 ml  Output 2775 ml  Net -1835 ml       Labs: Basic Metabolic Panel: Recent Labs  Lab 01/14/21 0450 01/14/21 0458 01/14/21 1702 01/15/21 0333 01/15/21 0334 01/16/21 0448 01/16/21 1947 01/17/21 0417  NA 138   < > 140 139   < > 144 146* 150*  K 3.8   < > 3.6 3.3*   < > 3.3* 3.5 3.3*  CL 105  --  99 101   < > 104 108 112*  CO2 24  --  24 24   < > 25 24 24   GLUCOSE 150*  --  169* 156*   < > 136* 163* 124*  BUN 77*  --  90* 105*   < > 113* 117* 120*  CREATININE 2.86*  --  3.26* 3.50*   < > 3.91* 3.85* 3.71*  CALCIUM 8.1*  --  7.9* 8.1*   < > 8.7* 8.6* 8.7*  PHOS 3.8  --  5.1* 6.4*  --   --   --   --    < > = values in this interval not displayed.   Liver Function Tests: Recent Labs  Lab 01/14/21 0450 01/14/21 1702 01/15/21 0333  ALBUMIN 2.3* 2.4* 2.3*   No results for input(s): LIPASE, AMYLASE in the last 168 hours. No results for input(s): AMMONIA in the last 168 hours. CBC: Recent Labs  Lab 01/14/21 0450 01/14/21 0458 01/14/21  1702 01/15/21 0333 01/15/21 0334 01/16/21 0448 01/17/21 0417  WBC 17.9*  --  17.9* 16.5*  --  14.4* 11.7*  HGB 7.5*   < > 7.7* 8.7* 8.2* 9.0* 8.3*  HCT 23.5*   < > 23.6* 26.5* 24.0* 28.2* 26.1*  MCV 93.6  --  92.5 89.8  --  92.8 93.2  PLT 234  --  251 272  --  386 394   < > = values in this interval not displayed.   Cardiac Enzymes: Recent Labs  Lab 01/11/21 1216  CKTOTAL 316   CBG: Recent Labs  Lab 01/16/21 1144 01/16/21 1554 01/16/21 1943 01/17/21 0043 01/17/21 0345  GLUCAP 120* 123* 125* 159* 108*    Iron Studies:  No results for input(s): IRON, TIBC, TRANSFERRIN, FERRITIN in the last 72 hours. Studies/Results: No results found.  Medications: Infusions: . feeding supplement (OSMOLITE 1.5 CAL) 60 mL/hr at 01/17/21 0400    Scheduled Medications: . aspirin  81 mg Per Tube Daily  . Chlorhexidine Gluconate Cloth  6 each Topical Daily  . feeding supplement (PROSource TF)  90 mL Per Tube BID  .  fiber  1 packet Per Tube BID  . free water  200 mL Per Tube Q4H  . heparin injection (subcutaneous)  5,000 Units Subcutaneous Q8H  . hydrALAZINE  25 mg Per Tube Q8H  . ipratropium-albuterol  3 mL Nebulization BID  . isosorbide dinitrate  10 mg Per Tube TID  . mouth rinse  15 mL Mouth Rinse BID  . modafinil  100 mg Per Tube Daily  . pantoprazole sodium  40 mg Per Tube Daily  . polyethylene glycol  17 g Per Tube BID  . potassium chloride  40 mEq Per Tube Q6H  . rosuvastatin  10 mg Per Tube Daily  . ticagrelor  90 mg Per Tube BID    have reviewed scheduled and prn medications.  Physical Exam: General: Not in distress, minimal response to command. Heart:RRR, s1s2 nl Lungs: Clear anteriorly Abdomen:soft,  non-distended Extremities:No edema Dialysis Access: L IJ temporary HD catheter  Sharissa Brierley Tanna Furry 01/17/2021,8:16 AM  LOS: 14 days

## 2021-01-17 NOTE — TOC Progression Note (Signed)
Transition of Care (TOC) - Progression Note  Heart Failure   Patient Details  Name: [name] MRN: [id number] Date of Birth: [birth year]  Transition of Care [facility]) CM/SW [place], [place] Phone Number: 01/17/2021, 5:06 PM  Clinical Narrative:    CSW received consult for possible SNF placement at time of discharge. CSW spoke with [name] [phone]. Patient's HCPOA reported that the patient lives alone. Patient's HCPOA expressed understanding of PT recommendation and is agreeable to SNF placement at time of discharge. Patient's HCPOA reports no preference for SNF and will await bed offers. CSW discussed insurance authorization process and provided Medicare SNF ratings list to the patient. Patient has received the COVID vaccines. Patient's HCPOA expressed being hopeful for rehab and to feel better soon. No further questions reported at this time. SNF workup started however CSW cannot fax patient out until the [place] is removed.  TOC will continue to follow for d/c needs.   Expected Discharge Plan: [place] Barriers to Discharge: Continued Medical Work up  Expected Discharge Plan and Services Expected Discharge Plan: [place] In-house Referral: Clinical Social Work Discharge Planning Services: CM Consult Post Acute Care Choice: [place] arrangements for the past 2 months: Single Family Home                                       Social Determinants of Health (SDOH) Interventions    Readmission Risk Interventions No flowsheet data found.  Rickelle Sylvestre, MSW, [place] Heart Failure Social Worker

## 2021-01-18 DIAGNOSIS — R570 Cardiogenic shock: Secondary | ICD-10-CM | POA: Diagnosis not present

## 2021-01-18 DIAGNOSIS — E870 Hyperosmolality and hypernatremia: Secondary | ICD-10-CM | POA: Diagnosis not present

## 2021-01-18 DIAGNOSIS — I2510 Atherosclerotic heart disease of native coronary artery without angina pectoris: Secondary | ICD-10-CM | POA: Diagnosis not present

## 2021-01-18 DIAGNOSIS — G9341 Metabolic encephalopathy: Secondary | ICD-10-CM

## 2021-01-18 DIAGNOSIS — N179 Acute kidney failure, unspecified: Secondary | ICD-10-CM | POA: Diagnosis not present

## 2021-01-18 LAB — COOXEMETRY PANEL
Carboxyhemoglobin: 1.8 % — ABNORMAL HIGH (ref 0.5–1.5)
Carboxyhemoglobin: 1.8 % — ABNORMAL HIGH (ref 0.5–1.5)
Methemoglobin: 0.8 % (ref 0.0–1.5)
Methemoglobin: 1 % (ref 0.0–1.5)
O2 Saturation: 61.7 %
O2 Saturation: 62.2 %
Total hemoglobin: 9 g/dL — ABNORMAL LOW (ref 12.0–16.0)
Total hemoglobin: 11.1 g/dL — ABNORMAL LOW (ref 12.0–16.0)

## 2021-01-18 LAB — BASIC METABOLIC PANEL
Anion gap: 11 (ref 5–15)
Anion gap: 9 (ref 5–15)
BUN: 120 mg/dL — ABNORMAL HIGH (ref 8–23)
BUN: 118 mg/dL — ABNORMAL HIGH (ref 8–23)
CO2: 23 mmol/L (ref 22–32)
CO2: 23 mmol/L (ref 22–32)
Calcium: 8.5 mg/dL — ABNORMAL LOW (ref 8.9–10.3)
Calcium: 8.4 mg/dL — ABNORMAL LOW (ref 8.9–10.3)
Chloride: 117 mmol/L — ABNORMAL HIGH (ref 98–111)
Chloride: 119 mmol/L — ABNORMAL HIGH (ref 98–111)
Creatinine, Ser: 3.32 mg/dL — ABNORMAL HIGH (ref 0.61–1.24)
Creatinine, Ser: 3.23 mg/dL — ABNORMAL HIGH (ref 0.61–1.24)
GFR, Estimated: 19 mL/min — ABNORMAL LOW (ref >60)
GFR, Estimated: 20 mL/min — ABNORMAL LOW (ref >60)
Glucose, Bld: 158 mg/dL — ABNORMAL HIGH (ref 70–99)
Glucose, Bld: 171 mg/dL — ABNORMAL HIGH (ref 70–99)
Potassium: 4.1 mmol/L (ref 3.5–5.1)
Potassium: 3.9 mmol/L (ref 3.5–5.1)
Sodium: 151 mmol/L — ABNORMAL HIGH (ref 135–145)
Sodium: 151 mmol/L — ABNORMAL HIGH (ref 135–145)

## 2021-01-18 LAB — GLUCOSE, CAPILLARY
Glucose-Capillary: 113 mg/dL — ABNORMAL HIGH (ref 70–99)
Glucose-Capillary: 120 mg/dL — ABNORMAL HIGH (ref 70–99)
Glucose-Capillary: 122 mg/dL — ABNORMAL HIGH (ref 70–99)
Glucose-Capillary: 122 mg/dL — ABNORMAL HIGH (ref 70–99)
Glucose-Capillary: 131 mg/dL — ABNORMAL HIGH (ref 70–99)
Glucose-Capillary: 135 mg/dL — ABNORMAL HIGH (ref 70–99)

## 2021-01-18 LAB — MAGNESIUM: Magnesium: 2.4 mg/dL (ref 1.7–2.4)

## 2021-01-18 MED ORDER — CHLORHEXIDINE GLUCONATE CLOTH 2 % EX PADS
6.0000 | MEDICATED_PAD | Freq: Every day | CUTANEOUS | Status: DC
  Administered 2021-01-19 – 2021-01-20 (×2): 6 via TOPICAL

## 2021-01-18 MED ORDER — IPRATROPIUM-ALBUTEROL 0.5-2.5 (3) MG/3ML IN SOLN
3.0000 mL | RESPIRATORY_TRACT | Status: DC | PRN
  Administered 2021-01-19: 3 mL via RESPIRATORY_TRACT
  Filled 2021-01-18: qty 3

## 2021-01-18 NOTE — Progress Notes (Signed)
[facility] PROGRESS NOTE  [name] [id number] DOB: 26-Sep-1950 DOA: 01/03/2021 PCP: [name], MD  Assessment/Plan: Active Problems:   Cardiogenic shock ([place])     1. Inferior STEMI : s/p PCI with stenting of the proximal RCA Cardiogenic shock and volume overload Ventricular fibrillation cardiac arrest Acute systolic congestive heart failure exacerbation: Management by cardiology temporarily Lasix drip and require pressors now off.  EF reported to be 20-25%\.  On dual antiplatelet therapy and statin.  Currently thought to be euvolemic. -Per cardiology  2. Acute hypoxic respiratory failure Ruled out HCAP: Required intubation on day of admission with an there was concern for possible HCAP for which patient was treated with Zosyn 4/24-4/26 after cultures were found to be negative.  Able to be extubated on 4/26 and is currently on room air  3. Anoxic brain injury Acute strokes: Patient noted to be twitching for which repeat EEG 4/18 was performed and noted periodic discharges.  MRI revealed multiple scattered punctate to small foci of infarction throughout the brain on 4/20.  He was evaluated for CIR by rehab medicine, but not felt to be a good candidate for inpatient rehab. -PT/OT/speech  -Prevalon boots as recommended by rehab medicine 4 hours on and 4 hours off and then at night -Tube feeds and free water -Transitions of care consult  4. Acute kidney injury: Improving. creatinine initially trended up to 5.04 on 4/22, but subsequently has been trending down.  Nephrology has been on board and patient temporarily was on CRRT, but currently making urine.  No need for hemodialysis suspected long-term.  Creatinine currently 3.32 with BUN 120.  Hemodialysis planned for today to try and breakdown BUN to see if any improvement in mental status. -Per nephrology   5. Acute blood loss anemia: Patient required multiple blood transfusions thought to be in part from bleeding  from Impella insertion site.  Hemoglobin stable at 11.1 g/dL today. -Continue to monitor  6. Hypernatremia: Sodium noted to be elevated up to 151.  Thought possibly related to dehydration.   -Adjust free water as needed   DVT prophylaxsis Heparin  Code Status: Full    Code Status Orders  (From admission, onward)         Start     Ordered   01/03/21 1232  Full code  Continuous        01/03/21 1231        Code Status History    Date Active Date Inactive Code Status Order ID Comments User Context   01/03/2021 1231 01/03/2021 1231 Full Code [id number]  Bensimhon, Shaune Pascal, MD Inpatient   Advance Care Planning Activity    Advance Directive Documentation   Flowsheet Row Most Recent Value  Type of Advance Directive Living will  Pre-existing out of facility DNR order (yellow form or pink MOST form) --  "MOST" Form in Place? --     Family Communication: family updated about patient's clinical progress Disposition Plan:  As above    Brief narrative: Mr. Muenchow is a 70 year old male with pmh HTN, HLD, CAD, and anxiety initially admitted with complaints of chest pain 4/14.  Found to have inferior STEMI taken immediately for cath, but developed bradycardia with hypotension requiring temporary pacer.  Subsequently, patient required intubation by anesthesia and CPR with episodes of V. fib with defibrillation.  He underwent PCI with stent placement to the proximal RCA which was totally occluded.  EF reported to be 25 to 30% by echocardiogram.  Course was complicated by cardiogenic shock requiring placement of Impella and later developed bleeding at the puncture site.  Hemoglobin dropped from 12.6 down to 7.5 g/dL requiring transfusion 4 units of packed red blood cells.  He was on vasopressors, sedated, and PCCM was consulted for vent management.  Patient was noted to have twitching of his mouth and continued to be lethargic for which an EEG revealed periodic discharges for which neurology have  been formally consulted.  Patient was able to be weaned off pressors and Impella had been able to be removed by 4/20. MRI revealed scattered punctate infarctions throughout the brain which was concerning for anoxic brain injury.  Patient has been started on antiepileptic medications.  During this point time patient was seen to go into acute renal failure with creatinine elevated up to 5.03 on 4/22.  Nephrology was consulted and patient subsequently was on CRRT briefly.  Eniola had to be discontinued due to lethargy and failed extubation 4/24.  Patient was able to be extubated on 4/26, but was noted to be able to speak or really follow commands.  Kidney function appears to be improving with our plans of long-term need of dialysis.  Consultants:  Cardiology, neurology, nephrology, PCCM, and rehab medicine  Procedures:  4/14 inferior STEMI with bradycardia s/p TVP, cardiogenic shock and cardiac arrest s/p Impella with PCI/stent to RCA, complicated by bleeding status post 4 units of PRBC secondary to cangrelor.  4/18 repeat EEG due to concerns of seizure-like activity found to have periodic discharges with triphasic morphology   4/19 off pressors  4/20 Impella removed  4/21-22 CRRT started  4/24 attempted extubation failed and patient reintubated  4/26 successful extubation  4/27 cortrak placed  Antibiotics: Anti-infectives (From admission, onward)   Start     Dose/Rate Route Frequency Ordered Stop   01/13/21 1400  piperacillin-tazobactam (ZOSYN) IVPB 3.375 g  Status:  Discontinued        3.375 g 12.5 mL/hr over 240 Minutes Intravenous Every 8 hours 01/13/21 1030 01/15/21 0813   01/13/21 1200  piperacillin-tazobactam (ZOSYN) IVPB 3.375 g  Status:  Discontinued        3.375 g 100 mL/hr over 30 Minutes Intravenous Every 6 hours 01/13/21 0955 01/13/21 1030   01/13/21 1045  ceFEPIme (MAXIPIME) 2 g in sodium chloride 0.9 % 100 mL IVPB  Status:  Discontinued        2 g 200 mL/hr over 30  Minutes Intravenous Every 12 hours 01/13/21 0949 01/13/21 0954   01/08/21 1100  cefTRIAXone (ROCEPHIN) 2 g in sodium chloride 0.9 % 100 mL IVPB  Status:  Discontinued        2 g 200 mL/hr over 30 Minutes Intravenous Every 24 hours 01/07/21 1244 01/10/21 1012   01/08/21 0600  vancomycin (VANCOREADY) IVPB 1250 mg/250 mL  Status:  Discontinued        1,250 mg 166.7 mL/hr over 90 Minutes Intravenous Every 48 hours 01/06/21 2202 01/08/21 1010   01/07/21 1108  ceFEPIme (MAXIPIME) 2 g in sodium chloride 0.9 % 100 mL IVPB  Status:  Discontinued        2 g 200 mL/hr over 30 Minutes Intravenous Every 24 hours 01/06/21 2202 01/07/21 1244   01/06/21 1200  vancomycin (VANCOREADY) IVPB 1000 mg/200 mL  Status:  Discontinued        1,000 mg 200 mL/hr over 60 Minutes Intravenous Every 24 hours 01/05/21 2209 01/06/21 2202   01/06/21 1200  ceFEPIme (MAXIPIME) 2 g in sodium chloride 0.9 %  100 mL IVPB  Status:  Discontinued        2 g 200 mL/hr over 30 Minutes Intravenous Every 12 hours 01/05/21 2209 01/06/21 2202   01/04/21 1400  ceFEPIme (MAXIPIME) 2 g in sodium chloride 0.9 % 100 mL IVPB        2 g 200 mL/hr over 30 Minutes Intravenous Every 8 hours 01/04/21 1335 01/05/21 2231   01/04/21 1000  ceFEPIme (MAXIPIME) 2 g in sodium chloride 0.9 % 100 mL IVPB  Status:  Discontinued        2 g 200 mL/hr over 30 Minutes Intravenous Every 12 hours 01/03/21 2131 01/04/21 1335   01/04/21 0600  vancomycin (VANCOREADY) IVPB 750 mg/150 mL  Status:  Discontinued        750 mg 150 mL/hr over 60 Minutes Intravenous Every 12 hours 01/03/21 1436 01/05/21 2209   01/03/21 2200  vancomycin (VANCOREADY) IVPB 750 mg/150 mL  Status:  Discontinued        750 mg 150 mL/hr over 60 Minutes Intravenous Every 12 hours 01/03/21 1241 01/03/21 1436   01/03/21 1400  ceFEPIme (MAXIPIME) 2 g in sodium chloride 0.9 % 100 mL IVPB  Status:  Discontinued        2 g 200 mL/hr over 30 Minutes Intravenous Every 8 hours 01/03/21 1238 01/03/21 2131    01/03/21 1330  vancomycin (VANCOREADY) IVPB 1250 mg/250 mL        1,250 mg 166.7 mL/hr over 90 Minutes Intravenous  Once 01/03/21 1241 01/03/21 1640         HPI/Subjective: Unable as patient is aphasic and not able to follow commands  Objective: Vitals:   01/18/21 0500 01/18/21 0600 01/18/21 0700 01/18/21 0738  BP: (!) 165/79 (!) 149/73 (!) 159/78   Pulse: 89 81 88   Resp: 18 18 20    Temp:    99.1 F (37.3 C)  TempSrc:    Axillary  SpO2: 95% 96% 95%   Weight:      Height:        Intake/Output Summary (Last 24 hours) at 01/18/2021 0829 Last data filed at 01/18/2021 [id number] Gross per 24 hour  Intake 1670 ml  Output 1900 ml  Net -230 ml    Exam:  General: Patient alert and will track some with eyes but otherwise aphasic HEENT:Cortak inplace with tube feeds Lungs: Coarse breath sounds.  No wheezes appreciated.  O2 saturation maintained on room air. Cardiovascular: Regular rate and rhythm without murmur gallop or rub normal S1 and S2 left IJ in place. Abdomen: Nontender, nondistended, soft, bowel sounds positive, no rebound, no ascites, no appreciable mass Genitourinary: Foley catheter in place as well as rectal tube Extremities: +1 pitting edema noted of the distal lower extremities bilaterally.     Data Review   Micro Results Recent Results (from the past 240 hour(s))  Culture, Respiratory w Gram Stain     Status: None   Collection Time: 01/11/21 11:24 AM   Specimen: Tracheal Aspirate; Respiratory  Result Value Ref Range Status   Specimen Description TRACHEAL ASPIRATE  Final   Special Requests NONE  Final   Gram Stain   Final    ABUNDANT WBC PRESENT,BOTH PMN AND MONONUCLEAR ABUNDANT GRAM NEGATIVE RODS FEW GRAM VARIABLE ROD FEW YEAST    Culture   Final    MODERATE Normal respiratory flora-no Staph aureus or Pseudomonas seen Performed at [facility], [address] [place], [place] [address]    Report Status 01/13/2021 FINAL  Final  Culture,  Respiratory w Gram Stain     Status: None   Collection Time: 01/13/21 12:01 PM   Specimen: Tracheal Aspirate; Respiratory  Result Value Ref Range Status   Specimen Description TRACHEAL ASPIRATE  Final   Special Requests NONE  Final   Gram Stain   Final    FEW WBC PRESENT, PREDOMINANTLY PMN FEW YEAST FEW GRAM NEGATIVE RODS    Culture   Final    RARE Normal respiratory flora-no Staph aureus or Pseudomonas seen Performed at [facility], [address] [place], [place] [address]    Report Status 01/15/2021 FINAL  Final  Culture, blood (routine x 2)     Status: None (Preliminary result)   Collection Time: 01/14/21 10:06 AM   Specimen: BLOOD  Result Value Ref Range Status   Specimen Description BLOOD BLOOD LEFT HAND  Final   Special Requests   Final    BOTTLES DRAWN AEROBIC AND ANAEROBIC Blood Culture results may not be optimal due to an inadequate volume of blood received in culture bottles   Culture   Final    NO GROWTH 3 DAYS Performed at [facility], [place] [address], [place], [place] [address]    Report Status PENDING  Incomplete  Culture, blood (routine x 2)     Status: None (Preliminary result)   Collection Time: 01/14/21 10:06 AM   Specimen: BLOOD  Result Value Ref Range Status   Specimen Description BLOOD BLOOD RIGHT HAND  Final   Special Requests   Final    BOTTLES DRAWN AEROBIC AND ANAEROBIC Blood Culture results may not be optimal due to an inadequate volume of blood received in culture bottles   Culture   Final    NO GROWTH 3 DAYS Performed at [facility], [place] [name] [place], [place] [address]    Report Status PENDING  Incomplete    Radiology Reports DG Chest 1 View  Result Date: 01/13/2021 CLINICAL DATA:  History of ET tube EXAM: CHEST  1 VIEW COMPARISON:  January 11, 2021 FINDINGS: The ETT terminates in good position. The left central line is stable terminating in the SVC. The right IJ sheath remains. The NG tube terminates below today's  film. No pneumothorax. Opacity in the medial right lung base is stable to mildly worsened in the interval. Mild opacity in left base is more prominent the interval. No other acute abnormalities. IMPRESSION: 1. Bibasilar opacities are mildly worsened in the interval worrisome for pneumonia. Recommend attention on follow-up. 2. Support apparatus as above. 3. No other acute abnormalities. Electronically Signed   By: Dorise Bullion III M.D   On: 01/13/2021 14:15   DG Abd 1 View  Result Date: 01/13/2021 CLINICAL DATA:  Evaluate OG tube EXAM: ABDOMEN - 1 VIEW COMPARISON:  January 09, 2021 FINDINGS: The OG tube is been pulled back in the interval. The distal tip is in the body of the stomach. The side port is near the GE junction. IMPRESSION: The NG tube is been pulled back in the interval the side port is now near the GE junction. Recommend advancing several cm. Electronically Signed   By: Dorise Bullion III M.D   On: 01/13/2021 14:15   DG Abd 1 View  Result Date: 01/09/2021 CLINICAL DATA:  Check gastric catheter placement EXAM: ABDOMEN - 1 VIEW COMPARISON:  None. FINDINGS: Gastric catheter is noted coiled within the stomach. Scattered bowel gas is noted. No obstructive changes are seen. Degenerative changes of  lumbar spine are noted with scoliosis concave to the right. IMPRESSION: Gastric catheter coiled within the stomach. Electronically Signed   By: Inez Catalina M.D.   On: 01/09/2021 20:35   CT HEAD WO CONTRAST  Result Date: 01/07/2021 CLINICAL DATA:  Seizure.  Anoxic brain damage. EXAM: CT HEAD WITHOUT CONTRAST TECHNIQUE: Contiguous axial images were obtained from the base of the skull through the vertex without intravenous contrast. COMPARISON:  None. FINDINGS: Brain: No evidence of acute infarction, hemorrhage, hydrocephalus, extra-axial collection or mass lesion/mass effect. No evidence of brain edema or mass-effect. Vascular: Negative for hyperdense vessel Skull: Negative Sinuses/Orbits: Extensive  mucosal edema and air-fluid levels throughout the paranasal sinuses. Negative orbit Other: None IMPRESSION: No acute abnormality.  No evidence of brain edema. Extensive mucosal edema and air-fluid levels throughout the paranasal sinuses. Electronically Signed   By: Franchot Gallo M.D.   On: 01/07/2021 14:45   MR BRAIN WO CONTRAST  Result Date: 01/09/2021 CLINICAL DATA:  Previous cardiac arrest. Question anoxic brain injury. EXAM: MRI HEAD WITHOUT CONTRAST TECHNIQUE: Multiplanar, multiecho pulse sequences of the brain and surrounding structures were obtained without intravenous contrast. COMPARISON:  Head CT 2 days ago. FINDINGS: Brain: Diffusion imaging shows numerous scattered small foci infarction affecting the cortical brain consistent with micro embolic infarctions rather than hypoxic ischemic injury generally. Single small focus present in the right cerebellum. Right cerebral hemisphere shows for punctate foci scattered in the parietal region. Left hemisphere shows a small cortical infarction left parietooccipital junction region and 8-10 punctate foci scattered within the frontal and parietal lobes. Small slightly larger cortical infarctions present in the left frontal and parietal vertex regions. No evidence of mass effect or hemorrhage. No evidence of pre-existing brain abnormality. No hydrocephalus or extra-axial collection. Vascular: Major vessels at the base of the brain show flow. Skull and upper cervical spine: Negative Sinuses/Orbits: Widespread sinus opacification, often seen with mechanical ventilation. Orbits negative. Other: None IMPRESSION: 1. Numerous scattered punctate to small foci of infarction throughout the brain consistent with micro embolic infarctions rather than hypoxic ischemic injury generally. No evidence of hemorrhage or mass effect. 2. Widespread sinus opacification, often seen with mechanical ventilation. Electronically Signed   By: Nelson Chimes M.D.   On: 01/09/2021 19:19    CARDIAC CATHETERIZATION  Result Date: 01/03/2021 Findings: On NE and impella RA = 4 RV = 19/3 PA = 20/10 (11) PCW = 7 Fick cardiac output/index = 2.6/1.7 FA sat = 100% PA sat = 54% Assessment: Mix of cardiogenic and hemorrhagic shock in setting of inferior STEMI and L groin bleed at Impella site Plan/Discussion: Continue supportive care. Glori Bickers, MD 12:00 PM   CARDIAC CATHETERIZATION  Result Date: 01/03/2021  Ost LAD to Prox LAD lesion is 50% stenosed.  Mid LAD lesion is 20% stenosed.  Prox RCA-1 lesion is 100% stenosed.  Prox RCA-2 lesion is 50% stenosed.  A stent was successfully placed.  Post intervention, there is a 0% residual stenosis.  Post intervention, there is a 0% residual stenosis.  Acute inferior wall myocardial infarction secondary to total proximal RCA occlusion complicated by cardiogenic shock, bradycardia, episodes of ventricular fibrillation requiring defibrillation, CPR, and emergency intubation. Emergent temporary pacemaker insertion advanced to the RV apex. Initial left coronary injection revealed flow minimal filling beyond the mid LAD and mid circumflex vessel. Cardiogenic shock requiring emergent Impella CP insertion via the left femoral artery. Relook of the left coronary system after Impella insertion showed brisk TIMI-3 flow with evidence for calcification of the proximal  LAD with 50% stenosis and mild 30% mid LAD stenosis, a very large high marginal/ramus distribution vessel extending to the apex and otherwise a normal-appearing left circumflex coronary artery. Total occlusion of the very proximal RCA with TIMI 0 flow Successful emergent PCI to the RCA with ultimate insertion of a 3.5 x 34 mm Resolute Onyx stent postdilated to 3.8 mm with 800% occlusion being reduced to 0% and with restoration of brisk TIMI-3 flow supplying a very large dominant RCA. RECOMMENDATION: The patient following stabilization will be brought to to the heart ICU the advanced heart failure  team with  management his Impella, critical care will be involved with ventilator management.  We will transition to Brilinta via NG tube once placed and discontinue the lower.  2D echo Doppler, vigorous hydration with continued hemodynamic support.   US RENAL  Result Date: 01/11/2021 CLINICAL DATA:  Acute kidney injury EXAM: RENAL / URINARY TRACT ULTRASOUND COMPLETE COMPARISON:  None. FINDINGS: Right Kidney: Renal measurements: 12.6 x 5.3 x 6.5 cm = volume: 226 mL. Echogenicity within normal limits. Two simple cysts at the lower pole of the right kidney measuring up to 3.3 cm and 2.9 cm, respectively. No shadowing stone or hydronephrosis visualized. Left Kidney: Renal measurements: 12.9 x 6.4 x 6.2 cm = volume: 265 mL. Echogenicity within normal limits. No mass, shadowing stone, or hydronephrosis visualized. Bladder: Decompressed by Foley catheter. Other: None. IMPRESSION: 1. Negative for obstructive uropathy. 2. Simple right renal cysts measuring up to 3.3 cm. Electronically Signed   By: Davina Poke D.O.   On: 01/11/2021 15:07   DG CHEST PORT 1 VIEW  Result Date: 01/11/2021 CLINICAL DATA:  Central line placement EXAM: PORTABLE CHEST 1 VIEW COMPARISON:  01/08/2021 FINDINGS: Endotracheal tube in good position. Swan-Ganz catheter removed. Right jugular sheath remains in place. NG tube in the stomach. Impella device removed in the interval. Interval placement of dual lumen central venous catheter from left jugular approach. Tip in the lower SVC at the cavoatrial junction. No pneumothorax. Progression of bibasilar airspace disease. No significant effusion. Upper lobes clear. IMPRESSION: Left jugular central venous catheter tip at the cavoatrial junction. No pneumothorax Progression of bibasilar atelectasis/infiltrate. Electronically Signed   By: Franchot Gallo M.D.   On: 01/11/2021 11:34   DG Chest Port 1 View  Result Date: 01/08/2021 CLINICAL DATA:  Intubation. EXAM: PORTABLE CHEST 1 VIEW COMPARISON:   01/06/2021. FINDINGS: Endotracheal tube and NG tube in stable position. Swan-Ganz catheter tip noted over a lower right pulmonary artery. Impella device in stable position. Cardiomegaly. Mild bilateral interstitial prominence. Tiny bilateral pleural effusions. Findings suggest mild CHF. Atelectatic changes and/or infiltrate right mid lung. Thorax. Degenerative change thoracic spine. IMPRESSION: 1. Endotracheal tube and NG tube in stable position. Swan-Ganz catheter tip noted over a lower right pulmonary artery. 2. Impella device in stable position. Cardiomegaly. Mild bilateral interstitial prominence. Tiny bilateral pleural effusions. Findings suggest mild CHF. 3.  Atelectatic changes and or infiltrate right mid lung. Electronically Signed   By: Marcello Moores  Register   On: 01/08/2021 06:50   DG CHEST PORT 1 VIEW  Result Date: 01/06/2021 CLINICAL DATA:  Respirator dependent.  Code STEMI EXAM: PORTABLE CHEST 1 VIEW COMPARISON:  Yesterday FINDINGS: Endotracheal tube with tip just below the clavicular heads. The enteric tube tip reaches the gastric fundus. Left ventricular assist device in similar position. Swan-Ganz catheter with tip at the right main pulmonary artery. Improved aeration although there is still persistent asymmetric hazy density on the right with appearance favoring atelectasis  or pleural fluid. Milder hazy density on the left. No interstitial edema or pneumothorax. IMPRESSION: 1. Stable hardware positioning. 2. Hazy appearance of the chest likely reflecting atelectasis, improved on the right since yesterday. Electronically Signed   By: Monte Fantasia M.D.   On: 01/06/2021 09:57   DG CHEST PORT 1 VIEW  Result Date: 01/05/2021 CLINICAL DATA:  Hypoxia.  Hypertension. EXAM: PORTABLE CHEST 1 VIEW COMPARISON:  Chest x-rays dated 01/04/2021 and 01/03/2021. FINDINGS: Endotracheal tube appears adequately positioned with tip approximately 5 cm above the carina. Swan-Ganz catheter in place with tip just to  the RIGHT of midline. Enteric tube passes below the diaphragm. Hazy opacities of bilateral lung bases, likely mild atelectasis and/or small pleural effusions. Lungs otherwise clear. No pneumothorax is seen. Heart size and mediastinal contours are stable. IMPRESSION: 1. Support apparatus appears stable in position. 2. Hazy opacities of bilateral lung bases, likely mild atelectasis and/or small pleural effusions. Electronically Signed   By: Franki Cabot M.D.   On: 01/05/2021 08:06   DG CHEST PORT 1 VIEW  Result Date: 01/04/2021 CLINICAL DATA:  Respiratory failure EXAM: PORTABLE CHEST 1 VIEW COMPARISON:  01/03/2021 FINDINGS: Endotracheal tube is seen 6.1 cm above the carina. Right internal jugular Swan-Ganz catheter tip has been slightly withdrawn, but still likely resides within the right inter lobar pulmonary artery. Nasogastric tube extends into the mid body of the stomach. Left ventricular assist device is in expected position overlying the expected left ventricular outflow tract and ascending aorta. Inferiorly approaching trans venous pacemaker lead overlies expected right ventricle. Lung volumes are small, but are stable. There is increasing retrocardiac opacification in keeping with left lower lobe collapse. No pneumothorax. Tiny left pleural effusion is suspected. Cardiac size is mildly enlarged. Pulmonary vascularity is normal. IMPRESSION: Essentially stable support lines and tubes. Right internal jugular Swan-Ganz catheter tipl is likely within the inter lobar pulmonary artery and withdrawal by 3.5-4 cm may better position the catheter. Progressive left lower lobe collapse. Suspected small associated left pleural effusion. Electronically Signed   By: Fidela Salisbury MD   On: 01/04/2021 08:24   Portable Chest x-ray  Result Date: 01/03/2021 CLINICAL DATA:  Hypoxia EXAM: PORTABLE CHEST 1 VIEW COMPARISON:  May 17, 2019 chest radiograph; chest CT June 07, 2019 FINDINGS: Endotracheal tube tip is 5.0  cm above the carina. Nasogastric tube tip and side port are below the diaphragm. Swan-Ganz catheter present with tip in the proximal right lower lobe pulmonary artery. Impella device present. No pneumothorax. There is a small left pleural effusion with mild left base atelectasis. Lungs elsewhere clear. Heart is borderline enlarged with pulmonary vascularity normal. No adenopathy. No bone lesions. IMPRESSION: Tube and catheter positions as described without evident pneumothorax. Small left pleural effusion with left base atelectasis. Lungs otherwise clear. Heart borderline enlarged. Electronically Signed   By: Lowella Grip III M.D.   On: 01/03/2021 13:05   EEG adult  Result Date: 01/07/2021 Lora Havens, MD     01/07/2021 12:37 PM Patient Name: Kostantinos Sokolik MRN: [id number] Epilepsy Attending: Lora Havens Referring Physician/Provider: Dr. Ina Homes Date: 01/07/2021 Duration: 24.33 mins Patient history: 70 year old male with seizure-like episode.  EEG to evaluate for seizures. Level of alertness: Lethargic AEDs during EEG study: None Technical aspects: This EEG study was done with scalp electrodes positioned according to the 10-20 International system of electrode placement. Electrical activity was acquired at a sampling rate of 500Hz  and reviewed with a high frequency filter of 70Hz  and a low frequency  filter of 1Hz . EEG data were recorded continuously and digitally stored. Description: EEG showed continuous generalized 3 to 5 Hz theta-delta slowing. Generalized periodic discharges with triphasic morphology at 2.5-3 Hz were also noted.  Patient was also noted to have subtle chin/mouth tremor during the EEG.  Concomitant EEG before, during and after the event continue to show generalized periodic discharges at 2.5 to 3 Hz.  Hyperventilation and photic stimulation were not performed.   ABNORMALITY - Periodic discharges with triphasic morphology, generalized ( GPDs) - Continuous slow, generalized  IMPRESSION: This study showed generalized periodic discharges with triphasic morphology at 2.5 to 3 Hz which is  on the ictal-interictal continuum with moderate to high potential for seizures.  Additionally there is evidence of moderate to severe diffuse encephalopathy, nonspecific etiology could be secondary to cefepime toxicity, anoxic /hypoxic brain injury.  Patient was noted to have subtle chin/mouth tremor during the EEG without concomitant EEG change.  However focal motor seizures may not be seen on scalp EEG.  Differentials also include myoclonus.  Recommend clinical correlation. Lora Havens   EEG adult  Result Date: 01/04/2021 Lora Havens, MD     01/04/2021 12:25 PM Patient Name: Quanta Roher MRN: [id number] Epilepsy Attending: Lora Havens Referring Physician/Provider: Dr. Noemi Chapel Date: 01/04/2021 Duration: 23.37 mins Patient history: 70 year old male with cardiac arrest, now with seizure-like episodes.  EEG to evaluate for seizures. Level of alertness: Lethargic/comatose AEDs during EEG study: Propofol Technical aspects: This EEG study was done with scalp electrodes positioned according to the 10-20 International system of electrode placement. Electrical activity was acquired at a sampling rate of 500Hz  and reviewed with a high frequency filter of 70Hz  and a low frequency filter of 1Hz . EEG data were recorded continuously and digitally stored. Description: EEG showed continuous generalized 3 to 5 Hz polymorphic sharply contoured theta-delta slowing.  Patient was noted to have shivering episodes during the study.  Concomitant EEG before, during and after the study did not show any EEG change to suggest seizure.  Hyperventilation and photic stimulation were not performed.   ABNORMALITY -Continuous slow, generalized IMPRESSION: This study is suggestive of severe diffuse encephalopathy, nonspecific etiology.  Patient was noted of shivering-like episodes during the study without  concomitant EEG change and were most likely not epileptic. No seizures or definite epileptiform discharges were seen throughout the recording. Priyanka Barbra Sarks   Overnight EEG with video  Result Date: 01/12/2021 Lora Havens, MD     01/12/2021  3:34 PM Patient Name:Pete Wynetta Emery OYD:[id number] Epilepsy Attending:Priyanka Barbra Sarks Referring Physician/Provider:Dr. Zeb Comfort Duration:01/11/2021 [phone] to 01/12/2021 0933  Patient history:70 year old male with seizure-like episode. EEG to evaluate forseizures.  Level of alertness:Lethargic  AEDs during EEG study:LEV  Technical aspects: This EEG study was done with scalp electrodes positioned according to the 10-20 International system of electrode placement. Electrical activity was acquired at a sampling rate of 500Hz  and reviewed with a high frequency filter of 70Hz  and a low frequency filter of 1Hz . EEG data were recorded continuously and digitally stored.  Description:EEG showed continuous generalized predominantly 5 to 7 Hz theta as well as intermittent 2-3hz  delta slowing. Event button was pressed on 01/11/2021 at [place] and 2218 for unclear reasons.Concomitant EEG before, during and after the event didn't show any eeg change to suggest seizure. Hyperventilation and photic stimulation were not performed.   ABNORMALITY - Continuous slow, generalized  IMPRESSION: This studyis suggestive of moderate diffuse encephalopathy, nonspecific etiology.  Event button was pressed on 01/11/2021 at  1941 and 2218 for unclear reasons without concomitant EEG change and were most likely NOT epileptic.   Priyanka Barbra Sarks  Overnight EEG with video  Result Date: 01/08/2021 Lora Havens, MD     01/08/2021 12:20 PM Patient Name: Phillp Dolores MRN: [id number] Epilepsy Attending: Lora Havens Referring Physician/Provider: Dr. Zeb Comfort Duration: 01/07/2021 1223 to 01/08/2021 1143  Patient history: 70 year old male with seizure-like  episode.  EEG to evaluate for seizures.  Level of alertness: Lethargic  AEDs during EEG study: LEV  Technical aspects: This EEG study was done with scalp electrodes positioned according to the 10-20 International system of electrode placement. Electrical activity was acquired at a sampling rate of 500Hz  and reviewed with a high frequency filter of 70Hz  and a low frequency filter of 1Hz . EEG data were recorded continuously and digitally stored.  Description: EEG showed continuous generalized 3 to 5 Hz theta-delta slowing. Generalized periodic discharges with triphasic morphology at 2.5-3 Hz were also noted.  Patient was also noted to have subtle chin/mouth tremor during the EEG.  Concomitant EEG before, during and after the event continue to show generalized periodic discharges at 2.5 to 3 Hz.  Hyperventilation and photic stimulation were not performed.   Patient was noted to have left corner of the mouth twitching intermittently throughout the study.  Concomitant EEG before, during and after the event did not show any EEG changes suggest seizure.  ABNORMALITY - Periodic discharges with triphasic morphology, generalized ( GPDs) - Continuous slow, generalized  IMPRESSION: This study showed generalized periodic discharges with triphasic morphology at 2.5 to 3 Hz which is  on the ictal-interictal continuum with moderate to high potential for seizures.    Patient was noted to have left: Twitching without concomitant EEG change.  However focal motor seizures may not be seen on scalp EEG.  Additionally there is evidence of moderate to severe diffuse encephalopathy, nonspecific etiology could be secondary to cefepime toxicity, anoxic /hypoxic brain injury.   Lora Havens   ECHOCARDIOGRAM LIMITED  Result Date: 01/03/2021    ECHOCARDIOGRAM LIMITED REPORT   Patient Name:   SERAPHIM TROW Date of Exam: 01/03/2021 Medical Rec #:  [id number]       Height:       73.0 in Accession #:    [id number]      Weight:        191.8 lb Date of Birth:  08-28-1951      BSA:          2.114 m Patient Age:    8 years        BP:           87/64 mmHg Patient Gender: M               HR:           92 bpm. Exam Location:  Inpatient Procedure: Limited Echo Indications:    CHF-Acute Systolic X93.71  History:        Patient has no prior history of Echocardiogram examinations.                 Risk Factors:Hypertension and Dyslipidemia. Cancer. CAD with                 Inferior STEMI. Cardiogenic Shock. VF Arrest.  Sonographer:    Darlina Sicilian RDCS Referring Phys: [id number] DANIEL R BENSIMHON  Sonographer Comments: Impella placement IMPRESSIONS  1. Left ventricular ejection fraction, by estimation, is 25 to 30%. The left ventricle has  severely decreased function. Impella catheter noted in LV at 2.35 cm depth.  2. Right ventricular systolic function is moderately reduced. The right ventricular size is normal.  3. Left atrial size was mildly dilated.  4. Limited echo. FINDINGS  Left Ventricle: Left ventricular ejection fraction, by estimation, is 25 to 30%. The left ventricle has severely decreased function. The left ventricular internal cavity size was normal in size. There is no left ventricular hypertrophy. Right Ventricle: The right ventricular size is normal. Right ventricular systolic function is moderately reduced. Left Atrium: Left atrial size was mildly dilated. Right Atrium: Right atrial size was normal in size. Additional Comments: A venous catheter is visualized in the right ventricle. Loralie Champagne MD Electronically signed by Loralie Champagne MD Signature Date/Time: 01/03/2021/5:48:09 PM    Final      CBC Recent Labs  Lab 01/14/21 [id number] 01/14/21 [id number] 01/14/21 1702 01/15/21 [id number] 01/15/21 0334 01/16/21 0448 01/17/21 0417  WBC 17.9*  --  17.9* 16.5*  --  14.4* 11.7*  HGB 7.5*   < > 7.7* 8.7* 8.2* 9.0* 8.3*  HCT 23.5*   < > 23.6* 26.5* 24.0* 28.2* 26.1*  PLT 234  --  251 272  --  386 394  MCV 93.6  --  92.5 89.8  --  92.8 93.2  MCH  29.9  --  30.2 29.5  --  29.6 29.6  MCHC 31.9  --  32.6 32.8  --  31.9 31.8  RDW 14.9  --  15.0 16.6*  --  16.5* 16.2*   < > = values in this interval not displayed.    Chemistries  Recent Labs  Lab 01/14/21 0450 01/14/21 0458 01/15/21 0333 01/15/21 0334 01/16/21 0448 01/16/21 1947 01/17/21 0417 01/17/21 2128 01/18/21 0308  NA 138   < > 139   < > 144 146* 150* 151* 151*  K 3.8   < > 3.3*   < > 3.3* 3.5 3.3* 4.1 4.1  CL 105   < > 101   < > 104 108 112* 118* 117*  CO2 24   < > 24   < > 25 24 24 24 23   GLUCOSE 150*   < > 156*   < > 136* 163* 124* 169* 158*  BUN 77*   < > 105*   < > 113* 117* 120* 119* 120*  CREATININE 2.86*   < > 3.50*   < > 3.91* 3.85* 3.71* 3.38* 3.32*  CALCIUM 8.1*   < > 8.1*   < > 8.7* 8.6* 8.7* 8.5* 8.5*  MG 2.6*  --  2.6*  --  2.5*  --  2.7*  --  2.4   < > = values in this interval not displayed.   ------------------------------------------------------------------------------------------------------------------ estimated creatinine clearance is 23.4 mL/min (A) (by C-G formula based on SCr of 3.32 mg/dL (H)). ------------------------------------------------------------------------------------------------------------------ No results for input(s): HGBA1C in the last 72 hours. ------------------------------------------------------------------------------------------------------------------ No results for input(s): CHOL, HDL, LDLCALC, TRIG, CHOLHDL, LDLDIRECT in the last 72 hours. ------------------------------------------------------------------------------------------------------------------ No results for input(s): TSH, T4TOTAL, T3FREE, THYROIDAB in the last 72 hours.  Invalid input(s): FREET3 ------------------------------------------------------------------------------------------------------------------ No results for input(s): VITAMINB12, FOLATE, FERRITIN, TIBC, IRON, RETICCTPCT in the last 72 hours.  Coagulation profile No results for input(s): INR,  PROTIME in the last 168 hours.  No results for input(s): DDIMER in the last 72 hours.  Cardiac Enzymes No results for input(s): CKMB, TROPONINI, MYOGLOBIN in the last 168 hours.  Invalid input(s): CK ------------------------------------------------------------------------------------------------------------------ Invalid input(s): POCBNP   CBG:  Recent Labs  Lab 01/17/21 1530 01/17/21 1938 01/17/21 2304 01/18/21 0318 01/18/21 0736  GLUCAP 145* 157* 141* 122* 131*       Studies: No results found.    Lab Results  Component Value Date   HGBA1C 5.7 (H) 01/06/2021   HGBA1C 5.5 05/24/2020   HGBA1C 5.6 10/12/2019   Lab Results  Component Value Date   LDLCALC 71 05/24/2020   CREATININE 3.32 (H) 01/18/2021       Scheduled Meds: . aspirin  81 mg Per Tube Daily  . Chlorhexidine Gluconate Cloth  6 each Topical Daily  . Chlorhexidine Gluconate Cloth  6 each Topical Q0600  . feeding supplement (PROSource TF)  90 mL Per Tube BID  . fiber  1 packet Per Tube BID  . free water  400 mL Per Tube Q6H  . heparin injection (subcutaneous)  5,000 Units Subcutaneous Q8H  . hydrALAZINE  25 mg Per Tube Q8H  . isosorbide dinitrate  10 mg Per Tube TID  . mouth rinse  15 mL Mouth Rinse BID  . modafinil  100 mg Per Tube Daily  . pantoprazole sodium  40 mg Per Tube Daily  . polyethylene glycol  17 g Per Tube BID  . rosuvastatin  10 mg Per Tube Daily  . ticagrelor  90 mg Per Tube BID   Continuous Infusions: . feeding supplement (OSMOLITE 1.5 CAL) 60 mL/hr at 01/17/21 1600    Active Problems:   Cardiogenic shock ([place])    Time spent: 45 minutes   [facility] If 7PM-7AM, please contact night-coverage at [email or web address] 01/18/2021, 8:29 AM  LOS: 15 days

## 2021-01-18 NOTE — Progress Notes (Signed)
Occupational Therapy Treatment Patient Details Name: [name] MRN: [id number] DOB: 1951-05-24 Today's Date: 01/18/2021    History of present illness Pt is 70 yo admitted 4/14 with chest pain and STEMI with bradycardia and hypotension. Pt taken for urgent cath with temporary pacer placed but pt decompensated to cardiac arrest with intubation and CPR. Stent placed and pt with further complication of cardiogenic shock with Impella placed. 4/18 seizure like activity, Impella removed 4/20, 4/20 MRI with scattered micro embolic infarctions. Extubated 4/24 and reintubated 2 hrs later. Extubated 4/26. PMHx: HTN, HLD, CAD   OT comments  Patient found in recliner with bolster under his knees.  He was sliding forward in the recliner, OT attempted to reposition back, but unsuccessful.  Patient with BM on hoyer pad, chair pad, due to rectal foley leaking.  Patient lifted back to bed via hoyer, and total assist for hygiene and bed mobility.  Discharge disposition changed to SNF for rehab post acute.  OT will continue to follow in the acute setting.     Follow Up Recommendations  SNF    Equipment Recommendations  Wheelchair cushion (measurements OT);Wheelchair (measurements OT);Hospital bed    Recommendations for Other Services      Precautions / Restrictions Precautions Precaution Comments: globally aphasic, cortrak, flexiseal       Mobility Bed Mobility                 Patient Response: Flat affect  Transfers                                                                 ADL either performed or assessed with clinical judgement   ADL   Eating/Feeding: Total assistance   Grooming: Total assistance;Sitting                       Toileting- Clothing Manipulation and Hygiene: Total assistance;Bed level       Functional mobility during ADLs: Total assistance                         Cognition Arousal/Alertness:  Awake/alert Behavior During Therapy: Flat affect                                   General Comments: Patient with little to no responses to questions vis head nod.  Patient makes good eye cantact, patient was tearful today and started crying when OT explained circumstances leading to hospitalization.        Exercises                  Pertinent Vitals/ Pain       Pain Assessment: Faces Faces Pain Scale: Hurts even more Pain Location: grimace with movement of cortrak, groin with hoyer back to bed. Pain Descriptors / Indicators: Grimacing Pain Intervention(s): Monitored during session  Frequency  Min 2X/week        Progress Toward Goals  OT Goals(current goals can now be found in the care plan section)     Acute Rehab OT Goals Patient Stated Goal: unable OT Goal Formulation: Patient unable to participate in goal setting Time For Goal Achievement: 01/30/21 Potential to Achieve Goals: Poor  Plan Discharge plan needs to be updated    Co-evaluation                 AM-PAC OT "6 Clicks" Daily Activity     Outcome Measure   Help from another person eating meals?: Total Help from another person taking care of personal grooming?: Total Help from another person toileting, which includes using toliet, bedpan, or urinal?: Total Help from another person bathing (including washing, rinsing, drying)?: Total Help from another person to put on and taking off regular upper body clothing?: Total Help from another person to put on and taking off regular lower body clothing?: Total 6 Click Score: 6    End of Session    OT Visit Diagnosis: Hemiplegia and hemiparesis;Cognitive communication deficit (R41.841);Muscle weakness (generalized) (M62.81);Other abnormalities of gait and mobility (R26.89) Symptoms and signs involving cognitive functions: Cerebral infarction Hemiplegia -  Right/Left: Right Hemiplegia - dominant/non-dominant: Dominant   Activity Tolerance Patient tolerated treatment well   Patient Left in bed;with call bell/phone within reach   Nurse Communication          Time: 4235-3614 OT Time Calculation (min): 19 min  Charges: OT General Charges $OT Visit: 1 Visit OT Treatments $Self Care/Home Management : 8-22 mins  01/18/2021  [name], OTR/L  Acute Rehabilitation Services  Office:  [phone]    Metta Clines 01/18/2021, 1:59 PM

## 2021-01-18 NOTE — Progress Notes (Signed)
[facility] NEPHROLOGY PROGRESS NOTE  Assessment/ Plan: Pt is a 70 y.o. yo male with history of HTN, HLD, CAD presented with chest pain found to have STEMI.  He underwent left heart cath, placement of Impella device, had V. fib arrest, mechanical ventilation for respiratory failure and AKI.  #Acute kidney injury due to cardiogenic/hemorrhagic shock.  UA bland and kidney ultrasound unremarkable.  Required CRRT from 4/22-4/24.  Initially required IV Lasix and now auto diuresing well.  Volume status is acceptable however BUN remains elevated.  No significant change in mental status therefore plan to do dialysis today mainly for clearance. Discussed with Dr. Haroldine Laws. Plan to monitor kidney function closely, strict ins and out.    #Hypernatremia due to increase free water excretion and recovering AKI.  Continue free water from the tube.  HD today.  #CAD/acute STEMI required emergent cath with stent placement.  EF 20 to 25%.  Cardiology following.  #V. fib arrest/cardiogenic shock, acute systolic CHF: Arrest in the setting of acute MI.  Impella was removed.  Now off of pressors or inotropes.  Volume status is acceptable.  #Ventilator dependent respiratory failure: He is now extubated and in room air.  #Anemia due to acute blood loss: Required multiple blood transfusion.  Transfuse as needed.  #Hypokalemia: Replete KCl.  #Encephalopathy: Concerned about anoxic brain injury as he does not have much improvement.  We will do dialysis today to bring BUN down to see if there is any improvement in mental status.  Subjective: Seen and examined.  Urine output around 1.5 L in 24 hours.  Only able to track but unable to follow commands.  No new event. Objective Vital signs in last 24 hours: Vitals:   01/18/21 0500 01/18/21 0600 01/18/21 0700 01/18/21 0738  BP: (!) 165/79 (!) 149/73 (!) 159/78   Pulse: 89 81 88   Resp: 18 18 20    Temp:    99.1 F (37.3 C)  TempSrc:    Axillary   SpO2: 95% 96% 95%   Weight:      Height:       Weight change: -0.8 kg  Intake/Output Summary (Last 24 hours) at 01/18/2021 0803 Last data filed at 01/18/2021 4332 Gross per 24 hour  Intake 1670 ml  Output 1900 ml  Net -230 ml       Labs: Basic Metabolic Panel: Recent Labs  Lab 01/14/21 0450 01/14/21 0458 01/14/21 1702 01/15/21 0333 01/15/21 0334 01/17/21 0417 01/17/21 2128 01/18/21 0308  NA 138   < > 140 139   < > 150* 151* 151*  K 3.8   < > 3.6 3.3*   < > 3.3* 4.1 4.1  CL 105  --  99 101   < > 112* 118* 117*  CO2 24  --  24 24   < > 24 24 23   GLUCOSE 150*  --  169* 156*   < > 124* 169* 158*  BUN 77*  --  90* 105*   < > 120* 119* 120*  CREATININE 2.86*  --  3.26* 3.50*   < > 3.71* 3.38* 3.32*  CALCIUM 8.1*  --  7.9* 8.1*   < > 8.7* 8.5* 8.5*  PHOS 3.8  --  5.1* 6.4*  --   --   --   --    < > = values in this interval not displayed.   Liver Function Tests: Recent Labs  Lab 01/14/21 0450 01/14/21 1702 01/15/21 0333  ALBUMIN 2.3* 2.4* 2.3*  No results for input(s): LIPASE, AMYLASE in the last 168 hours. No results for input(s): AMMONIA in the last 168 hours. CBC: Recent Labs  Lab 01/14/21 0450 01/14/21 0458 01/14/21 1702 01/15/21 0333 01/15/21 0334 01/16/21 0448 01/17/21 0417  WBC 17.9*  --  17.9* 16.5*  --  14.4* 11.7*  HGB 7.5*   < > 7.7* 8.7* 8.2* 9.0* 8.3*  HCT 23.5*   < > 23.6* 26.5* 24.0* 28.2* 26.1*  MCV 93.6  --  92.5 89.8  --  92.8 93.2  PLT 234  --  251 272  --  386 394   < > = values in this interval not displayed.   Cardiac Enzymes: Recent Labs  Lab 01/11/21 1216  CKTOTAL 316   CBG: Recent Labs  Lab 01/17/21 1530 01/17/21 1938 01/17/21 2304 01/18/21 0318 01/18/21 0736  GLUCAP 145* 157* 141* 122* 131*    Iron Studies:  No results for input(s): IRON, TIBC, TRANSFERRIN, FERRITIN in the last 72 hours. Studies/Results: No results found.  Medications: Infusions: . feeding supplement (OSMOLITE 1.5 CAL) 60 mL/hr at 01/17/21  1600    Scheduled Medications: . aspirin  81 mg Per Tube Daily  . Chlorhexidine Gluconate Cloth  6 each Topical Daily  . Chlorhexidine Gluconate Cloth  6 each Topical Q0600  . feeding supplement (PROSource TF)  90 mL Per Tube BID  . fiber  1 packet Per Tube BID  . free water  400 mL Per Tube Q6H  . heparin injection (subcutaneous)  5,000 Units Subcutaneous Q8H  . hydrALAZINE  25 mg Per Tube Q8H  . ipratropium-albuterol  3 mL Nebulization BID  . isosorbide dinitrate  10 mg Per Tube TID  . mouth rinse  15 mL Mouth Rinse BID  . modafinil  100 mg Per Tube Daily  . pantoprazole sodium  40 mg Per Tube Daily  . polyethylene glycol  17 g Per Tube BID  . rosuvastatin  10 mg Per Tube Daily  . ticagrelor  90 mg Per Tube BID    have reviewed scheduled and prn medications.  Physical Exam: General: Not in distress, opening eyes and able to track but not following command. Heart:RRR, s1s2 nl, no rubs Lungs: Clear anteriorly Abdomen:soft,  non-distended Extremities:No edema Dialysis Access: L IJ temporary HD catheter  Lavel Rieman Tanna Furry 01/18/2021,8:03 AM  LOS: 15 days

## 2021-01-18 NOTE — Progress Notes (Signed)
Physical Therapy Treatment Patient Details Name: [name] MRN: [id number] DOB: [birth year] Today's Date: 01/18/2021    History of Present Illness Pt is 70 yo admitted 4/14 with chest pain and STEMI with bradycardia and hypotension. Pt taken for urgent cath with temporary pacer placed but pt decompensated to cardiac arrest with intubation and CPR. Stent placed and pt with further complication of cardiogenic shock with Impella placed. 4/18 seizure like activity, Impella removed 4/20, 4/20 MRI with scattered micro embolic infarctions. Extubated 4/24 and reintubated 2 hrs later. Extubated 4/26. PMHx: HTN, HLD, CAD    PT Comments    Pt with flat affect and leaning to left on arrival. Neck rotation stretch as well as bil UE HEP. Pt would attend to name called a couple times today and did nod yes that he goes by [name]. Pt with lack of LUE assist with rolling and transfers today but improved activation of bil LE in standing. Lift to chair with improved alertness.  HR 88 SPO2 96% on RA    Follow Up Recommendations  Supervision/Assistance - 24 hour;SNF     Equipment Recommendations  Wheelchair cushion (measurements PT);3in1 (PT);Wheelchair (measurements PT);Hospital bed    Recommendations for Other Services       Precautions / Restrictions Precautions Precautions: Fall Precaution Comments: globally aphasic, cortrak, flexiseal    Mobility  Bed Mobility Overal bed mobility: Needs Assistance Bed Mobility: Rolling;Supine to Sit Rolling: Total assist;+2 for physical assistance   Supine to sit: Total assist     General bed mobility comments: physical assist to bend knee and position UEs across chest with rolling bil. No UE activation with rolling this session.Supine to sit via bed egress function of bed. Total assist for supine to sit    Transfers Overall transfer level: Needs assistance   Transfers: Sit to/from Stand Sit to Stand: Max assist;+2 physical assistance;From elevated  surface         General transfer comment: max +2 assist with bil knees blocked and use of pad to cradle sacrum and stand from surface. Pt maintains flexed hip/knee/trunk with rise from surface and improved bil LE acceptance of weight as able to fully clear sacrum and maintain grossly 10 sec. Total assist for anterior translation of trunk. [place] for transition from end of bed to chair  Ambulation/Gait             General Gait Details: unable   Stairs             Wheelchair Mobility    Modified Rankin (Stroke Patients Only) Modified Rankin (Stroke Patients Only) Pre-Morbid Rankin Score: No symptoms Modified Rankin: Severe disability     Balance Overall balance assessment: Needs assistance Sitting-balance support: Feet supported;No upper extremity supported Sitting balance-Leahy Scale: Zero Sitting balance - Comments: physical assist mod-max for trunk control in sitting   Standing balance support: Bilateral upper extremity supported Standing balance-Leahy Scale: Zero Standing balance comment: upright stance with bil max assist.                            Cognition Arousal/Alertness: Awake/alert Behavior During Therapy: Flat affect Overall Cognitive Status: Difficult to assess                     Current Attention Level: Focused           General Comments: pt not following commands today for any movement. Pt with 2 periods of visual focus with name "[name]"  called but inconsistent.      Exercises General Exercises - Upper Extremity Shoulder Flexion: PROM;Both;10 reps;Supine Elbow Flexion: PROM;Both;10 reps;Supine Elbow Extension: PROM;10 reps;Both;Supine    General Comments        Pertinent Vitals/Pain Pain Location: grimace with movement of cortrak, left groin Pain Descriptors / Indicators: Grimacing Pain Intervention(s): Limited activity within patient's tolerance;Monitored during session;Repositioned    Home Living                       Prior Function            PT Goals (current goals can now be found in the care plan section) Progress towards PT goals: Not progressing toward goals - comment (lack of command following and functional interaction)    Frequency    Min 3X/week      PT Plan Current plan remains appropriate;Frequency needs to be updated    Co-evaluation              AM-PAC PT "6 Clicks" Mobility   Outcome Measure  Help needed turning from your back to your side while in a flat bed without using bedrails?: Total Help needed moving from lying on your back to sitting on the side of a flat bed without using bedrails?: Total Help needed moving to and from a bed to a chair (including a wheelchair)?: Total Help needed standing up from a chair using your arms (e.g., wheelchair or bedside chair)?: Total Help needed to walk in hospital room?: Total Help needed climbing 3-5 steps with a railing? : Total 6 Click Score: 6    End of Session   Activity Tolerance: Patient tolerated treatment well Patient left: in chair;with call bell/phone within reach;with chair alarm set Nurse Communication: Mobility status;Need for lift equipment PT Visit Diagnosis: Unsteadiness on feet (R26.81);Muscle weakness (generalized) (M62.81);Hemiplegia and hemiparesis Hemiplegia - Right/Left: Right Hemiplegia - dominant/non-dominant: Non-dominant Hemiplegia - caused by: Cerebral infarction     Time: 4854-6270 PT Time Calculation (min) (ACUTE ONLY): 32 min  Charges:  $Therapeutic Activity: 23-37 mins                     Merwyn Hodapp P, PT Acute Rehabilitation Services Pager: [phone] Office: [phone]    Jazzman Loughmiller B Ailed Defibaugh 01/18/2021, 9:00 AM

## 2021-01-18 NOTE — Progress Notes (Signed)
  Speech Language Pathology Treatment: Dysphagia  Patient Details Name: [name] MRN: [id number] DOB: 1951/04/13 Today's Date: 01/18/2021 Time: 1749-4496 SLP Time Calculation (min) (ACUTE ONLY): 12 min  Assessment / Plan / Recommendation Clinical Impression  Pt did not verbalize or vocalize throughout session, but did appear to return demonstration for a head nod x1 with verbal and visual cues. He shows minimal awareness of boluses as they are presented, opening his mouth but not making attempts to manipulate them or close his lips. Despite consistencies attempted, SLP used yankauer to orally suction them out of his mouth. Will continue to follow for readiness to complete MBS, but for now would continue to use temporary, alternative means of nutrition.   HPI HPI: Pt is 70 y.o. with PMH of  HTN, HLD, CAD, admitted with chest pain and STEMI with bradycardia and hypotension. Pt taken for urgent cath with temporary pacer placed but pt decompensated to cardiac arrest with intubation and CPR. Stent placed and pt with further complication of cardiogenic shock with Impella placed. 4/18 seizure like activity, Impella removed 4/20, 4/20 MRI with scattered micro embolic infarctions. ETT 4/14-4/24 and reintubated 2 hrs later. Extubated 4/26. EEG (4/23) revealed moderate diffuse encephalopathy, nonspecific etiology. Chest xray (4/24) revealed:"Bibasilar opacities are mildly worsened in the interval worrisome for pneumonia". Failed [facility] Swallow Screen 2/26.      SLP Plan  Continue with current plan of care       Recommendations  Diet recommendations: NPO Medication Administration: Via alternative means                Oral Care Recommendations: Oral care QID;Oral care prior to ice chip/H20;Staff/trained caregiver to provide oral care Follow up Recommendations: Inpatient Rehab SLP Visit Diagnosis: Dysphagia, unspecified (R13.10) Plan: Continue with current plan of care       GO                 Osie Bond., M.A. [place] Acute Rehabilitation Services Pager [phone] Office [phone]  01/18/2021, 11:37 AM

## 2021-01-18 NOTE — Progress Notes (Signed)
Inpatient Rehab Admissions Coordinator:   Pt assessed by Dr. Dagoberto Ligas and does not appear to be able to tolerate CIR at this time and she recommended SNF for slower, prolonged rehab.  Given pt's [facility] Medicare, we would not be able to get SNF authorization following a CIR stay.  Will sign off for CIR for now.  If pt improves, please feel free to reconsult.   Shann Medal, PT, DPT Admissions Coordinator [phone] 01/18/21  10:58 AM

## 2021-01-18 NOTE — TOC Progression Note (Addendum)
Transition of Care (TOC) - Progression Note  Heart Failure   Patient Details  Name: [name] MRN: [id number] Date of Birth: 1951-05-17  Transition of Care [facility]) CM/SW [place], [place] Phone Number: 01/18/2021, 10:16 AM  Clinical Narrative:    CSW attempted to visit the patient at bedside again to complete a very brief SDOH screening with the patient to address social needs as needed however the patient was unresponsive and unable to engage in conversation at this time due to patient being nonverbal and will not follow commands per aphasia. SDOH filled out on 12/28/20 by an LPN. CSW cannot fax patient out for SNF's until cortrak is no longer in place.   CSW will continue to follow for discharge needs and will check back with the patient at another time.      Expected Discharge Plan: [place] Barriers to Discharge: Continued Medical Work up  Expected Discharge Plan and Services Expected Discharge Plan: [place] In-house Referral: Clinical Social Work Discharge Planning Services: CM Consult Post Acute Care Choice: [place] arrangements for the past 2 months: Single Family Home                                       Social Determinants of Health (SDOH) Interventions    Readmission Risk Interventions No flowsheet data found.  Rosamond Andress, MSW, [place] Heart Failure Social Worker

## 2021-01-18 NOTE — Progress Notes (Addendum)
Patient ID: [name], male   DOB: [birth year], 70 y.o.   MRN: [id number]     Advanced Heart Failure Rounding Note  PCP-Cardiologist: No primary care provider on file.   Subjective:    4/20 Impella removed. MRI brain with numerous micro embolic infarctions. Off pressors.  4/21 lasix drip stopped.  4/22 started CVVH 4/24 CVVH stopped. Failed extubation. Re-intubated.  4/26 Extubated   Awake and will track and direct attention but remains nonverbal and will not follow commands.   Creatinine slightly improved BUN stable at 120.    Objective:   Weight Range: 87 kg Body mass index is 25.3 kg/m.   Vital Signs:   Temp:  [97.7 F (36.5 C)-99.1 F (37.3 C)] 99.1 F (37.3 C) (04/29 0738) Pulse Rate:  [77-95] 88 (04/29 0700) Resp:  [18-37] 20 (04/29 0700) BP: (140-165)/(67-79) 159/78 (04/29 0700) SpO2:  [94 %-98 %] 95 % (04/29 0700) Weight:  [87 kg] 87 kg (04/29 0316) Last BM Date: 01/17/21  Weight change: Filed Weights   01/15/21 0500 01/17/21 0500 01/18/21 0316  Weight: 91.2 kg 87.8 kg 87 kg    Intake/Output:   Intake/Output Summary (Last 24 hours) at 01/18/2021 0748 Last data filed at 01/18/2021 4098 Gross per 24 hour  Intake 1730 ml  Output 1900 ml  Net -170 ml      Physical Exam   General: Sitting up in bed nonverbal. Will tack but not follow commands HEENT: normal Neck: supple. no JVD. L neck catheter Carotids 2+ bilat; no bruits. No lymphadenopathy or thryomegaly appreciated. Cor: PMI nondisplaced. Regular rate & rhythm. No rubs, gallops or murmurs. Lungs: clear Abdomen: soft, nontender, nondistended. No hepatosplenomegaly. No bruits or masses. Good bowel sounds. Extremities: no cyanosis, clubbing, rash, edema Neuro: as above   Telemetry   Sinus 80s Personally reviewed  Labs    CBC Recent Labs    01/16/21 0448 01/17/21 0417  WBC 14.4* 11.7*  HGB 9.0* 8.3*  HCT 28.2* 26.1*  MCV 92.8 93.2  PLT 386 119   Basic Metabolic Panel Recent Labs     01/17/21 0417 01/17/21 2128 01/18/21 0308  NA 150* 151* 151*  K 3.3* 4.1 4.1  CL 112* 118* 117*  CO2 24 24 23   GLUCOSE 124* 169* 158*  BUN 120* 119* 120*  CREATININE 3.71* 3.38* 3.32*  CALCIUM 8.7* 8.5* 8.5*  MG 2.7*  --  2.4   Liver Function Tests No results for input(s): AST, ALT, ALKPHOS, BILITOT, PROT, ALBUMIN in the last 72 hours. No results for input(s): LIPASE, AMYLASE in the last 72 hours. Cardiac Enzymes No results for input(s): CKTOTAL, CKMB, CKMBINDEX, TROPONINI in the last 72 hours.  BNP: BNP (last 3 results) No results for input(s): BNP in the last 8760 hours.  ProBNP (last 3 results) No results for input(s): PROBNP in the last 8760 hours.   D-Dimer No results for input(s): DDIMER in the last 72 hours. Hemoglobin A1C No results for input(s): HGBA1C in the last 72 hours. Fasting Lipid Panel No results for input(s): CHOL, HDL, LDLCALC, TRIG, CHOLHDL, LDLDIRECT in the last 72 hours. Thyroid Function Tests No results for input(s): TSH, T4TOTAL, T3FREE, THYROIDAB in the last 72 hours.  Invalid input(s): FREET3  Other results:   Imaging    No results found.   Medications:     Scheduled Medications: . aspirin  81 mg Per Tube Daily  . Chlorhexidine Gluconate Cloth  6 each Topical Daily  . feeding supplement (PROSource TF)  90 mL Per  Tube BID  . fiber  1 packet Per Tube BID  . free water  400 mL Per Tube Q6H  . heparin injection (subcutaneous)  5,000 Units Subcutaneous Q8H  . hydrALAZINE  25 mg Per Tube Q8H  . ipratropium-albuterol  3 mL Nebulization BID  . isosorbide dinitrate  10 mg Per Tube TID  . mouth rinse  15 mL Mouth Rinse BID  . modafinil  100 mg Per Tube Daily  . pantoprazole sodium  40 mg Per Tube Daily  . polyethylene glycol  17 g Per Tube BID  . rosuvastatin  10 mg Per Tube Daily  . ticagrelor  90 mg Per Tube BID    Infusions: . feeding supplement (OSMOLITE 1.5 CAL) 60 mL/hr at 01/17/21 1600    PRN Medications: sodium  chloride, acetaminophen (TYLENOL) oral liquid 160 mg/5 mL, ondansetron (ZOFRAN) IV, sodium chloride flush    Assessment/Plan   1. CAD/ Acute Inferior STEMI - emergent cath w/ pRCA occlusion s/p PCI + DES - Initial angiography showed sluggish flow in LAD and LCX>>improved post impella placement  - Echo LVEF 20-25%, RV moderately reduced - Continue DAPT/statin - no s/s of angina  2. VF Arrest - in setting of acute MI, now s/p revascularization  - rhythm stable.  - appears to have severe anoxic brain injury  3. Shock, Combination Cardiogenic + Hemorrhagic  - Acute MI + developed severe hemorrhagic shock due to bleeding at left groin Impella Transfused 4U PRBCs . - Impella out 4/20  - resolved  4. Acute Systolic Heart Failure>>Cardiogenic Shock  - ICM Echo LVEF 20-25%, RV moderately reduced - Impella out 4/20. Off all pressors. Co-ox 62% - CVVH stopped 4/24.  - No bb with shock.  - Continue hydralazine 25 mg tid and imdur 30 daily.   5. Acute hypoxic respiratory failure - 4/24 failed extubation trial.  - Extubated and now room air with stable sats.  - On room air.  - CCM signed off.   5. Nonoliguric AKI - due to ATN/shock - CVVH begun 4/22 and stopped 4/24  - SCR improving. BUN still high.  - volume status ok  - will d/w Nephrology if it is worth one session of iHD to get BUN down and reassess mental status or just continue watchful waiting.   6. Anoxic brain injury  - EEG w/ generalized periodic discharges with triphasic morphology + moderate to severe diffuse encephalopathy.  - MRI- numerous micro embolic infarctions . No hemorrhage or mass.  - CIR has turned down due to poor rehab potential. Recommending SNF  7. Acute blood loss anemia  -Received 1u on 4/25.   - Hgb stable - Follow daily CBC.  8. Hypernatremia - FW per Nephrology  He is ready for transfer out of the unit. No acute cardiac  Needs remaining. Will ask Transfer to floor and ask TRH to assume care.    Length of Stay: 15  Glori Bickers, MD  7:48 AM

## 2021-01-19 ENCOUNTER — Inpatient Hospital Stay (HOSPITAL_COMMUNITY): Payer: Medicare PPO

## 2021-01-19 DIAGNOSIS — I5023 Acute on chronic systolic (congestive) heart failure: Secondary | ICD-10-CM

## 2021-01-19 DIAGNOSIS — Z7189 Other specified counseling: Secondary | ICD-10-CM

## 2021-01-19 DIAGNOSIS — N179 Acute kidney failure, unspecified: Secondary | ICD-10-CM | POA: Diagnosis not present

## 2021-01-19 DIAGNOSIS — R570 Cardiogenic shock: Secondary | ICD-10-CM | POA: Diagnosis not present

## 2021-01-19 DIAGNOSIS — Z515 Encounter for palliative care: Secondary | ICD-10-CM | POA: Diagnosis not present

## 2021-01-19 DIAGNOSIS — Z66 Do not resuscitate: Secondary | ICD-10-CM | POA: Diagnosis not present

## 2021-01-19 DIAGNOSIS — G931 Anoxic brain damage, not elsewhere classified: Secondary | ICD-10-CM

## 2021-01-19 DIAGNOSIS — E870 Hyperosmolality and hypernatremia: Secondary | ICD-10-CM | POA: Diagnosis not present

## 2021-01-19 LAB — CBC
HCT: 27.5 % — ABNORMAL LOW (ref 39.0–52.0)
Hemoglobin: 8.8 g/dL — ABNORMAL LOW (ref 13.0–17.0)
MCH: 31.1 pg (ref 26.0–34.0)
MCHC: 32 g/dL (ref 30.0–36.0)
MCV: 97.2 fL (ref 80.0–100.0)
Platelets: 478 10*3/uL — ABNORMAL HIGH (ref 150–400)
RBC: 2.83 MIL/uL — ABNORMAL LOW (ref 4.22–5.81)
RDW: 17.1 % — ABNORMAL HIGH (ref 11.5–15.5)
WBC: 12.7 10*3/uL — ABNORMAL HIGH (ref 4.0–10.5)
nRBC: 0 % (ref 0.0–0.2)

## 2021-01-19 LAB — CULTURE, BLOOD (ROUTINE X 2)
Culture: NO GROWTH
Culture: NO GROWTH

## 2021-01-19 LAB — BLOOD GAS, ARTERIAL
Acid-Base Excess: 4.7 mmol/L — ABNORMAL HIGH (ref 0.0–2.0)
Bicarbonate: 28.1 mmol/L — ABNORMAL HIGH (ref 20.0–28.0)
Drawn by: 164
FIO2: 40
O2 Saturation: 58.5 %
Patient temperature: 37
pCO2 arterial: 37.5 mmHg (ref 32.0–48.0)
pH, Arterial: 7.487 — ABNORMAL HIGH (ref 7.350–7.450)
pO2, Arterial: 32.6 mmHg — CL (ref 83.0–108.0)

## 2021-01-19 LAB — BASIC METABOLIC PANEL
Anion gap: 12 (ref 5–15)
BUN: 112 mg/dL — ABNORMAL HIGH (ref 8–23)
CO2: 25 mmol/L (ref 22–32)
Calcium: 8.6 mg/dL — ABNORMAL LOW (ref 8.9–10.3)
Chloride: 119 mmol/L — ABNORMAL HIGH (ref 98–111)
Creatinine, Ser: 3.21 mg/dL — ABNORMAL HIGH (ref 0.61–1.24)
GFR, Estimated: 20 mL/min — ABNORMAL LOW (ref >60)
Glucose, Bld: 154 mg/dL — ABNORMAL HIGH (ref 70–99)
Potassium: 4 mmol/L (ref 3.5–5.1)
Sodium: 156 mmol/L — ABNORMAL HIGH (ref 135–145)

## 2021-01-19 LAB — COOXEMETRY PANEL
Carboxyhemoglobin: 1.6 % — ABNORMAL HIGH (ref 0.5–1.5)
Methemoglobin: 0.9 % (ref 0.0–1.5)
O2 Saturation: 63.5 %
Total hemoglobin: 9.1 g/dL — ABNORMAL LOW (ref 12.0–16.0)

## 2021-01-19 LAB — GLUCOSE, CAPILLARY
Glucose-Capillary: 113 mg/dL — ABNORMAL HIGH (ref 70–99)
Glucose-Capillary: 125 mg/dL — ABNORMAL HIGH (ref 70–99)
Glucose-Capillary: 135 mg/dL — ABNORMAL HIGH (ref 70–99)
Glucose-Capillary: 143 mg/dL — ABNORMAL HIGH (ref 70–99)
Glucose-Capillary: 146 mg/dL — ABNORMAL HIGH (ref 70–99)

## 2021-01-19 LAB — MAGNESIUM: Magnesium: 2.6 mg/dL — ABNORMAL HIGH (ref 1.7–2.4)

## 2021-01-19 MED ORDER — GLYCOPYRROLATE 1 MG PO TABS
1.0000 mg | ORAL_TABLET | Freq: Three times a day (TID) | ORAL | Status: DC
  Administered 2021-01-19: 1 mg via ORAL
  Filled 2021-01-19 (×2): qty 1

## 2021-01-19 MED ORDER — ATROPINE SULFATE 1 % OP SOLN
4.0000 [drp] | Freq: Four times a day (QID) | OPHTHALMIC | Status: DC | PRN
  Filled 2021-01-19: qty 2

## 2021-01-19 MED ORDER — FREE WATER
600.0000 mL | Freq: Four times a day (QID) | Status: DC
  Administered 2021-01-19 – 2021-01-20 (×3): 600 mL

## 2021-01-19 MED ORDER — HEPARIN SODIUM (PORCINE) 1000 UNIT/ML IJ SOLN
INTRAMUSCULAR | Status: AC
  Administered 2021-01-19: 1000 [IU]
  Filled 2021-01-19: qty 4

## 2021-01-19 NOTE — Progress Notes (Addendum)
Pt coughed up 5 inch blood clot. MD notified. Pt's respiratory status remains the same. Pt still on 5 L of O2 at 90%. Pt is being suctioned as needed. Will continue to monitor.   2305: Pt coughed up 2nd blood clot, pt still on 5L at 97%   PCCM came to assess pt. Rapid Response called. Pt transferred to 51M.

## 2021-01-19 NOTE — Progress Notes (Addendum)
[facility] NEPHROLOGY PROGRESS NOTE  Assessment/ Plan: Pt is a 70 y.o. yo male with history of HTN, HLD, CAD presented with chest pain found to have STEMI.  He underwent left heart cath, placement of Impella device, had V. fib arrest, mechanical ventilation for respiratory failure and AKI.  #Acute kidney injury due to cardiogenic/hemorrhagic shock.  UA bland and kidney ultrasound unremarkable.  Required CRRT from 4/22-4/24.  Initially required IV Lasix and now auto diuresing well.  Volume status is acceptable however BUN remains elevated.  No significant change in mental status therefore plan to do dialysis today mainly for clearance. At HD now and has been tolerating well.  Plan to monitor kidney function closely, strict ins and out.    #Hypernatremia due to increase free water excretion and recovering AKI.  Increase free water from the tube.  HD today.  #CAD/acute STEMI required emergent cath with stent placement.  EF 20 to 25%.  Cardiology following.  #V. fib arrest/cardiogenic shock, acute systolic CHF: Arrest in the setting of acute MI.  Impella was removed.  Now off of pressors or inotropes.  Volume status is acceptable.  #Ventilator dependent respiratory failure: extubated and in room air.  #Anemia due to acute blood loss: Required multiple blood transfusion.  Transfuse as needed.  #Hypokalemia: Replete KCl.  #Encephalopathy: Concerned about anoxic brain injury as he does not have much improvement.  We will do dialysis today to bring BUN down to see if there is any improvement in mental status.  Subjective: Seen and examined.  HD today as postponed from yesterday.  Urine output 2.7 L.  Currently on dialysis and tolerating well. Transferred out from ICU. Objective Vital signs in last 24 hours: Vitals:   01/19/21 0645 01/19/21 0700 01/19/21 0730 01/19/21 0800  BP: (!) 163/82 (!) 151/75 139/74 (!) 152/88  Pulse: 90 83 86 95  Resp: 15 19 20 18   Temp:      TempSrc:       SpO2: 99% 99% 99% 98%  Weight:      Height:       Weight change: -0.1 kg  Intake/Output Summary (Last 24 hours) at 01/19/2021 0859 Last data filed at 01/19/2021 0532 Gross per 24 hour  Intake 1020 ml  Output 2750 ml  Net -1730 ml       Labs: Basic Metabolic Panel: Recent Labs  Lab 01/14/21 0450 01/14/21 0458 01/14/21 1702 01/15/21 0333 01/15/21 0334 01/18/21 0308 01/18/21 1700 01/19/21 0500  NA 138   < > 140 139   < > 151* 151* 156*  K 3.8   < > 3.6 3.3*   < > 4.1 3.9 4.0  CL 105  --  99 101   < > 117* 119* 119*  CO2 24  --  24 24   < > 23 23 25   GLUCOSE 150*  --  169* 156*   < > 158* 171* 154*  BUN 77*  --  90* 105*   < > 120* 118* 112*  CREATININE 2.86*  --  3.26* 3.50*   < > 3.32* 3.23* 3.21*  CALCIUM 8.1*  --  7.9* 8.1*   < > 8.5* 8.4* 8.6*  PHOS 3.8  --  5.1* 6.4*  --   --   --   --    < > = values in this interval not displayed.   Liver Function Tests: Recent Labs  Lab 01/14/21 0450 01/14/21 1702 01/15/21 0333  ALBUMIN 2.3* 2.4* 2.3*   No  results for input(s): LIPASE, AMYLASE in the last 168 hours. No results for input(s): AMMONIA in the last 168 hours. CBC: Recent Labs  Lab 01/14/21 1702 01/15/21 0333 01/15/21 0334 01/16/21 0448 01/17/21 0417 01/19/21 0626  WBC 17.9* 16.5*  --  14.4* 11.7* 12.7*  HGB 7.7* 8.7*   < > 9.0* 8.3* 8.8*  HCT 23.6* 26.5*   < > 28.2* 26.1* 27.5*  MCV 92.5 89.8  --  92.8 93.2 97.2  PLT 251 272  --  386 394 478*   < > = values in this interval not displayed.   Cardiac Enzymes: No results for input(s): CKTOTAL, CKMB, CKMBINDEX, TROPONINI in the last 168 hours. CBG: Recent Labs  Lab 01/18/21 1112 01/18/21 1520 01/18/21 2005 01/18/21 2352 01/19/21 0418  GLUCAP 122* 113* 120* 135* 143*    Iron Studies:  No results for input(s): IRON, TIBC, TRANSFERRIN, FERRITIN in the last 72 hours. Studies/Results: No results found.  Medications: Infusions: . feeding supplement (OSMOLITE 1.5 CAL) 1,000 mL (01/18/21  2358)    Scheduled Medications: . aspirin  81 mg Per Tube Daily  . Chlorhexidine Gluconate Cloth  6 each Topical Daily  . Chlorhexidine Gluconate Cloth  6 each Topical Q0600  . feeding supplement (PROSource TF)  90 mL Per Tube BID  . fiber  1 packet Per Tube BID  . free water  400 mL Per Tube Q6H  . heparin injection (subcutaneous)  5,000 Units Subcutaneous Q8H  . hydrALAZINE  25 mg Per Tube Q8H  . isosorbide dinitrate  10 mg Per Tube TID  . mouth rinse  15 mL Mouth Rinse BID  . modafinil  100 mg Per Tube Daily  . pantoprazole sodium  40 mg Per Tube Daily  . polyethylene glycol  17 g Per Tube BID  . rosuvastatin  10 mg Per Tube Daily  . ticagrelor  90 mg Per Tube BID    have reviewed scheduled and prn medications.  Physical Exam: General: Not in distress, opening eyes and tracking. Heart:RRR, s1s2 nl, no rubs Lungs: Clear anteriorly Abdomen:soft,  non-distended Extremities:No edema Dialysis Access: L IJ temporary HD catheter  Alayzha An Prasad Dariel Pellecchia 01/19/2021,8:59 AM  LOS: 16 days

## 2021-01-19 NOTE — Progress Notes (Signed)
   01/19/21 1900  Assess: MEWS Score  Temp 98.1 F (36.7 C)  BP (!) 128/56  Pulse Rate 89  ECG Heart Rate 83  Resp (!) 35  Level of Consciousness Alert  SpO2 93 %  O2 Device Nasal Cannula  O2 Flow Rate (L/min) 5 L/min  Assess: MEWS Score  MEWS Temp 0  MEWS Systolic 0  MEWS Pulse 0  MEWS RR 2  MEWS LOC 0  MEWS Score 2  MEWS Score Color Yellow  Assess: if the MEWS score is Yellow or Red  Were vital signs taken at a resting state? Yes  Focused Assessment No change from prior assessment  Early Detection of Sepsis Score *See Row Information* Low  MEWS guidelines implemented *See Row Information* No, previously yellow, continue vital signs every 4 hours  Treat  MEWS Interventions Administered prn meds/treatments  Notify: Charge Nurse/RN  Name of Charge Nurse/RN Notified [place] RN  Date Charge Nurse/RN Notified 01/19/21  Time Charge Nurse/RN Notified 1915  Document  Patient Outcome Not stable and remains on department  Progress note created (see row info) Yes

## 2021-01-19 NOTE — Progress Notes (Signed)
[facility] night shift progressive unit coverage note.  The patient was seen due to tachypnea with a respiratory rate in the 30s and O2 sat in the mid to high 80s.  ABG was drawn, see results below.  The patient is nonverbal, but may follow simple commands.  He is afebrile, most recent heart rate was 83 bpm and BP 128/56 mmHg. the nursing staff reports that the patient has had some improvement with increased oxygen supplementation.  Respiratory: He is tachypneic with RR in the mid 20s.  Diminished on both bases with bibasilar crackles and bilateral upper airways rhonchi. Cardiovascular: S1-S2, RRR, no murmurs. Abdomen: Soft, nontender. Extremities: No edema, clubbing or cyanosis.  Blood gas, arterial [[id number]] (Abnormal)   Collected: 01/19/21 1904   Updated: 01/19/21 1924   Specimen Type: Blood, Arterial   Specimen Source: Artery-    FIO2 40.00   pH, Arterial 7.487High   pCO2 arterial 37.5 mmHg   pO2, Arterial 32.6Low Panic  mmHg   Bicarbonate 28.1High mmol/L   Acid-Base Excess 4.7High mmol/L   O2 Saturation 58.5 %   Patient temperature 37.0   Collection site LEFT BRACHIAL   Drawn by 164   Sample type ARTERIAL   Allens test (pass/fail) PASS   PORTABLE CHEST 1 VIEW CLINICAL DATA:  [place] COMPARISON:  01/13/2021  FINDINGS: Single frontal view of the chest demonstrates interval removal of the endotracheal tube and right internal jugular Cordis. Enteric catheter passes below diaphragm tip excluded by collimation. Left internal jugular catheter tip projects over superior vena cava. The cardiac silhouette is unremarkable. No acute airspace disease, effusion, or pneumothorax. No acute bony abnormalities.  IMPRESSION: 1. Support devices as above. 2. Interval resolution of bibasilar consolidation. No acute intrathoracic process.  Electronically Signed   By: Randa Ngo M.D.   On: 01/19/2021 20:45  Assessment/plan: Acute respiratory failure with hypoxia Continue  supplemental oxygen. RT to suction as needed. Continue neb treatments.  Hemoptysis Heparin held. SCDs started. Consulted PCCM.  Tennis Must, MD.

## 2021-01-19 NOTE — Consult Note (Addendum)
Palliative Medicine Inpatient Consult Note  Reason for consult: Discuss goals of care  HPI:  Per intake H&P --> [name] "[name]" [name] has been admitted to [facility] for the past 16 days.  He has had a very complicated hospitalization inclusive of an intensive care unit stay for cardiogenic shock he also went into V. fib arrest and required multiple CPR episodes.  He has been medically optimized and is now on the cardiac unit though there is concern regarding his his MRI scan for anoxic brain injury.  Palliative care has been asked to get involved in the setting of suspected anoxic brain injury to further discuss goals of care with patient's power of attorney, Merry Proud.  Clinical Assessment/Goals of Care:  *Please note that this is a verbal dictation therefore any spelling or grammatical errors are due to the "[employer] One" system interpretation.  I have reviewed medical records including EPIC notes, labs and imaging, received report from bedside RN, assessed the patient who is sitting in bed alert to self.    I called patient's power of attorney, [name] to further discuss diagnosis prognosis, GOC, EOL wishes, disposition and options.   I introduced Palliative Medicine as specialized medical care for people living with serious illness. It focuses on providing relief from the symptoms and stress of a serious illness. The goal is to improve quality of life for both the patient and the family.  Merry Proud shares with me that [name] was in excellent health.  He had blood pressure and cholesterol medications but otherwise was a very active man.  He has never been married and does not have any children.  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  He used to enjoy horseback riding and was very active in local groups with his friends.  He has a man of faith impact practices within Christianity.  [name] has lived alone throughout his life.  He was fully independent prior to  hospital admission of all basic activities of daily living and instrumental activities of daily living.  A detailed discussion was had today regarding advanced directives - a copy has been obtained and will be scanned into Vynca.    Concepts specific to code status, artifical feeding and hydration, continued IV antibiotics and rehospitalization was had.  I reviewed with Merry Proud that if [name] suffers from another event which results in a cardiac arrest that his likelihood for recovery after undergoing another cardiopulmonary event would be quite questionable.  I shared with Merry Proud that it may be fruitful to start thinking about this and deciding what we think would be an appropriate CODE STATUS for [name] taking into consideration all that his body has already endured.   We further discussed [name]'s prolonged hospitalization and all that he has gone through since the date of admission inclusive of an intensive care unit stay for cardiogenic shock, V. fib arrest requiring multiple CPR attempts, his present kidney injury which is requiring dialysis, and the injury that his brain has undergone due to all of these factors.  We discussed the reality that all organ systems are related to one another and that if one is  dissarray the likelihood of another one following is not far off.  We reviewed best case and worst-case scenarios and [name]'s present health situation.  Discussed that the best case scenario would be for [name] to regain some degree of functionality though I shared my concern that [name] will never be independently functional as he was before hospitalization.  We reviewed the worst case scenario of [name] continuing to come back and forth to the hospital whether it be for an infection or additional cardiac concerns.  Merry Proud and I have agreed to meet in person tomorrow to review lares living will and further make decisions on things such as CPR, intubation, feeding tubes.  We further plan to discuss what we  believe [name] would want under his present circumstances if he were able to advocate for himself.  Discussed the importance of continued conversation with family and their  medical providers regarding overall plan of care and treatment options, ensuring decisions are within the context of the patients values and GOCs.  Decision Maker: [name] [facility]) [phone]  SUMMARY OF RECOMMENDATIONS   Full Code/Full Scope of Care - for the time being recommended HCPOA give deep thought and consideration of if clear he would want to undergo another cardiopulmonary resuscitation should he go into cardiac arrest being all that he is already been through  Formal goals of care conversations to ensue tomorrow with patient's healthcare power of attorney, Merry Proud at [place] directives will be scanned into Vynca  Ongoing palliative care support  Code Status/Advance Care Planning: FULL CODE   Palliative Prophylaxis:   Oral Care, Mobility, Pain, Delirium  Additional Recommendations (Limitations, Scope, Preferences):  Continue current scope of care   Psycho-social/Spiritual:   Desire for further Chaplaincy support: No  Additional Recommendations: Education on post cardiac arrest outcomes   Prognosis:  Patient underwent cardiac arrest while hospitalized requiring multiple CPR attempts now has acute kidney injury and has suffered anoxia to some degree.   Discharge Planning:  Discharge plan is unclear at this time  Vitals:   01/19/21 1014 01/19/21 1100  BP: (!) 151/76 (!) 142/79  Pulse: 76 69  Resp: 20 20  Temp: 98.8 F (37.1 C) (!) 97.5 F (36.4 C)  SpO2: 96% 90%    Intake/Output Summary (Last 24 hours) at 01/19/2021 1444 Last data filed at 01/19/2021 1300 Gross per 24 hour  Intake 830 ml  Output 2747 ml  Net -1917 ml   Last Weight  Most recent update: 01/19/2021 10:34 AM   Weight  85 kg (187 lb 6.3 oz)           Gen: Elderly ill-appearing male HEENT: Core track in place,  dry mucous membranes CV: Regular rate and rhythm PULM:  On 2LPM [place] ABD: soft/nontender/nondistended  EXT: No edema  Neuro: Alert and oriented x1 - intermittently stares  PPS: 10%   This conversation/these recommendations were discussed with patient primary care team, Dr. Ree Kida  Time In: 1440 Time Out: 1530 Total Time: 70 Greater than 50%  of this time was spent counseling and coordinating care related to the above assessment and plan.  [facility] Team Team Cell Phone: [phone] Please utilize secure chat with additional questions, if there is no response within 30 minutes please call the above phone number  Palliative Medicine Team providers are available by phone from 7am to 7pm daily and can be reached through the team cell phone.  Should this patient require assistance outside of these hours, please call the patient's attending physician.

## 2021-01-19 NOTE — Progress Notes (Signed)
Pt healthcare POA states he goes by " [name]" And may not answer to [place]

## 2021-01-19 NOTE — Progress Notes (Signed)
PROGRESS NOTE    [name]  LOV:[id number] DOB: 1951/08/31 DOA: 01/03/2021 PCP: [name], MD   Brief Narrative:  HPI on 01/03/2021 by Dr. Glori Bickers (Cardiology) [name] is seen today for evaluation of cardiogenic shock  at the request of Dr Ellyn Hack.   Mr Olivo is a HTN, and CAD noted on CT.   Presented to ED via EMS with chest pain. ST elevation noted. Code STEMI called Taken urgently to the cath lab. Had VF arrest with shocks then went in CHB. Had Impella placed and intubation.   Interim history Patient here with chest pain on 01/03/2021, found to have anterior ST EMI and taken immediately for catheterization but developed bradycardia and hypotension and required temporary pacer.  Patient was intubated.  He also had CPR with episodes of V. fib and defibrillation.  He underwent PCI with stent placement to proximal RCA which was totally occluded.  EF was found to be 25 to 30% on echocardiogram.  Hospital course was complicated by cardiogenic shock and neuro quiring the placement of Impella [name] developed bleeding at the puncture site.  Patient's hemoglobin did drop to 7.5 and he required 4 units of PRBC.  He was placed on vasopressors.  PCCM was following for vent management.  Patient was also found to be lethargic and having lip twitching, EEG revealed periodic discharges.  Neurology was consulted.  Patient was weaned off of pressors and Impella was removed on 01/09/2021.  MRI did show scattered punctate infarctions throughout the brain which is concerning for anoxic brain injury.  Patient was also found to have acute kidney injury and required CRRT, currently on HD.  Creatinine appears to be improving and patient able to make urine.  Patient was extubated on 01/15/21.  He continues to be altered and is unable to communicate verbally or follow commands.  CIR was consulted and felt the patient was not a good candidate, and recommended SNF placement.  He continues to have  core track in place and is on tube feeds. Assessment & Plan   CAD/Acute Inferior STEMI -Patient presented with chest pain and underwent emergent catheterization with PCI and placement of drug-eluting stent to the RCA -Initial angiography showed sluggish flow in the LAD and left circumflex, patient had Impella placement which was removed on 01/09/2021 -Echocardiogram showed an EF of 20 to 25%, RV mildly reduced -Continue Brilinta, aspirin, statin, Isordil -Cardiology managing  Acute systolic heart failure with cardiogenic shock -Echocardiogram as above -Impella removed t on 01/09/2021 -Patient did require pressors however these were discontinued on 01/08/2021  -Continue hydralazine, Imdur -Currently not on ACE or ARB given AKI  Acute hypoxic respiratory failure -Patient required intubation on day of admission and was extubated on 4/26 -Currently on room air -Patient was treated for possible pneumonia with Zosyn  V. fib arrest -In the setting of acute MI status post revascularization -Currently stable  Anoxic brain injury/acute CVA -Patient was noted to have twitching, EEG done on 4/18 was performed which showed periodic discharges -MRI brain reveals multiple scattered punctate to small foci of infarction throughout the main on 4/20 -Neurology consulted and appreciated -Continue PT, OT, speech therapy -Inpatient rehab felt patient was not a good candidate for inpatient rehab and recommended SNF placement -CIR recommended Prevalon boots 4 hours on and 4 hours off and then at night -Continue aspirin, Brilinta, statin  Acute kidney injury -Creatinine peaked to 5.04 on 4/2/ Elevated BUN 120, and has subsequently been trending downward -Nephrology consulted and appreciated -Patient  is currently making urine -Creatinine down to 3.21 -Discussed with nephrology, Dr. Ronalee BeltsBhandari, does not feel that patient will be long-term dialysis patient as his creatinine is improving and he is able to make  urine  Hypernatremia -Sodium up to 156 today -Possibly due to dehydration -Continue free water flushes and monitor BMP  Acute blood loss anemia -During hospitalization, patient's hemoglobin dropped to 7.5 and he required 4 units of PRBC -Hemoglobin currently 8.8 -Continue to monitor CBC  Goals of care -Discussed with healthcare power of attorney, will consult palliative care for assistance with goals of care discussion  DVT Prophylaxis Heparin  Code Status: Full  Family Communication: None at bedside.  HCPOA via phone  Disposition Plan:  Status is: Inpatient  Remains inpatient appropriate because:Persistent severe electrolyte disturbances, IV treatments appropriate due to intensity of illness or inability to take PO and Inpatient level of care appropriate due to severity of illness   Dispo: The patient is from: Home              Anticipated d/c is to: SNF              Patient currently is not medically stable to d/c.   Difficult to place patient No  Consultants Cardiology Neurology PCCM Nephrology Inpatient rehab  Procedures/significant events  4/14 inferior STEMI with bradycardia s/p TVP, cardiogenic shock and cardiac arrest s/p Impella with PCI/stent to RCA, complicated by bleeding status post 4 units of PRBC secondary to cangrelor.  4/18 repeat EEG due to concerns of seizure-like activity found to have periodic discharges with triphasic morphology   4/19 off pressors  4/20 Impella removed  4/21-22 CRRT started  4/24 attempted extubation failed and patient reintubated  4/26 successful extubation  4/27 cortrak placed   Antibiotics   Anti-infectives (From admission, onward)   Start     Dose/Rate Route Frequency Ordered Stop   01/13/21 1400  piperacillin-tazobactam (ZOSYN) IVPB 3.375 g  Status:  Discontinued        3.375 g 12.5 mL/hr over 240 Minutes Intravenous Every 8 hours 01/13/21 1030 01/15/21 0813   01/13/21 1200  piperacillin-tazobactam (ZOSYN)  IVPB 3.375 g  Status:  Discontinued        3.375 g 100 mL/hr over 30 Minutes Intravenous Every 6 hours 01/13/21 0955 01/13/21 1030   01/13/21 1045  ceFEPIme (MAXIPIME) 2 g in sodium chloride 0.9 % 100 mL IVPB  Status:  Discontinued        2 g 200 mL/hr over 30 Minutes Intravenous Every 12 hours 01/13/21 0949 01/13/21 0954   01/08/21 1100  cefTRIAXone (ROCEPHIN) 2 g in sodium chloride 0.9 % 100 mL IVPB  Status:  Discontinued        2 g 200 mL/hr over 30 Minutes Intravenous Every 24 hours 01/07/21 1244 01/10/21 1012   01/08/21 0600  vancomycin (VANCOREADY) IVPB 1250 mg/250 mL  Status:  Discontinued        1,250 mg 166.7 mL/hr over 90 Minutes Intravenous Every 48 hours 01/06/21 2202 01/08/21 1010   01/07/21 1108  ceFEPIme (MAXIPIME) 2 g in sodium chloride 0.9 % 100 mL IVPB  Status:  Discontinued        2 g 200 mL/hr over 30 Minutes Intravenous Every 24 hours 01/06/21 2202 01/07/21 1244   01/06/21 1200  vancomycin (VANCOREADY) IVPB 1000 mg/200 mL  Status:  Discontinued        1,000 mg 200 mL/hr over 60 Minutes Intravenous Every 24 hours 01/05/21 2209 01/06/21 2202  01/06/21 1200  ceFEPIme (MAXIPIME) 2 g in sodium chloride 0.9 % 100 mL IVPB  Status:  Discontinued        2 g 200 mL/hr over 30 Minutes Intravenous Every 12 hours 01/05/21 2209 01/06/21 2202   01/04/21 1400  ceFEPIme (MAXIPIME) 2 g in sodium chloride 0.9 % 100 mL IVPB        2 g 200 mL/hr over 30 Minutes Intravenous Every 8 hours 01/04/21 1335 01/05/21 2231   01/04/21 1000  ceFEPIme (MAXIPIME) 2 g in sodium chloride 0.9 % 100 mL IVPB  Status:  Discontinued        2 g 200 mL/hr over 30 Minutes Intravenous Every 12 hours 01/03/21 2131 01/04/21 1335   01/04/21 0600  vancomycin (VANCOREADY) IVPB 750 mg/150 mL  Status:  Discontinued        750 mg 150 mL/hr over 60 Minutes Intravenous Every 12 hours 01/03/21 1436 01/05/21 2209   01/03/21 2200  vancomycin (VANCOREADY) IVPB 750 mg/150 mL  Status:  Discontinued        750 mg 150 mL/hr  over 60 Minutes Intravenous Every 12 hours 01/03/21 1241 01/03/21 1436   01/03/21 1400  ceFEPIme (MAXIPIME) 2 g in sodium chloride 0.9 % 100 mL IVPB  Status:  Discontinued        2 g 200 mL/hr over 30 Minutes Intravenous Every 8 hours 01/03/21 1238 01/03/21 2131   01/03/21 1330  vancomycin (VANCOREADY) IVPB 1250 mg/250 mL        1,250 mg 166.7 mL/hr over 90 Minutes Intravenous  Once 01/03/21 1241 01/03/21 1640      Subjective:   [name] seen and examined today.  Seen in hemodialysis.  Patient awake however nonverbal and does not follow commands.      Objective:   Vitals:   01/19/21 0930 01/19/21 1000 01/19/21 1014 01/19/21 1100  BP: 134/86 (!) 144/75 (!) 151/76 (!) 142/79  Pulse: 77 78 76 69  Resp: (!) 23 (!) 22 20 20   Temp:   98.8 F (37.1 C) (!) 97.5 F (36.4 C)  TempSrc:   Axillary Oral  SpO2: 97% 92% 96% 90%  Weight:   85 kg   Height:        Intake/Output Summary (Last 24 hours) at 01/19/2021 1248 Last data filed at 01/19/2021 1014 Gross per 24 hour  Intake 780 ml  Output 2747 ml  Net -1967 ml   Filed Weights   01/19/21 0346 01/19/21 0635 01/19/21 1014  Weight: 86.9 kg 85 kg 85 kg    Exam  General: Well developed, acutely/chronically ill-appearing, NAD  HEENT: NCAT, mucous membranes moist. Cortrak in place  Neck: Supple, no JVD, no masses  Cardiovascular: S1 S2 auscultated, no rubs, murmurs or gallops. Regular rate and rhythm. L IJ in place  Respiratory: Diminished breath sounds anteriorly however clear  Abdomen: Soft, nontender, nondistended, + bowel sounds  Extremities: warm dry without cyanosis clubbing or edema  Neuro: Awake, somewhat alert.  Cannot assess orientation as patient is nonverbal and is not following commands   Data Reviewed: I have personally reviewed following labs and imaging studies  CBC: Recent Labs  Lab 01/14/21 1702 01/15/21 0333 01/15/21 0334 01/16/21 0448 01/17/21 0417 01/19/21 0626  WBC 17.9* 16.5*  --  14.4*  11.7* 12.7*  HGB 7.7* 8.7* 8.2* 9.0* 8.3* 8.8*  HCT 23.6* 26.5* 24.0* 28.2* 26.1* 27.5*  MCV 92.5 89.8  --  92.8 93.2 97.2  PLT 251 272  --  386 394 478*  Basic Metabolic Panel: Recent Labs  Lab 01/12/21 1557 01/12/21 1604 01/13/21 0430 01/13/21 1637 01/14/21 0450 01/14/21 0458 01/14/21 1702 01/15/21 0333 01/15/21 0334 01/16/21 0448 01/16/21 1947 01/17/21 0417 01/17/21 2128 01/18/21 0308 01/18/21 1700 01/19/21 0500  NA 141   < > 139  139   < > 138   < > 140 139   < > 144   < > 150* 151* 151* 151* 156*  K 4.4   < > 4.2  4.2   < > 3.8   < > 3.6 3.3*   < > 3.3*   < > 3.3* 4.1 4.1 3.9 4.0  CL 105  --  105  104   < > 105  --  99 101   < > 104   < > 112* 118* 117* 119* 119*  CO2 25  --  28  28   < > 24  --  24 24   < > 25   < > 24 24 23 23 25   GLUCOSE 128*  --  140*  140*   < > 150*  --  169* 156*   < > 136*   < > 124* 169* 158* 171* 154*  BUN 55*  --  48*  47*   < > 77*  --  90* 105*   < > 113*   < > 120* 119* 120* 118* 112*  CREATININE 2.11*  --  1.72*  1.69*   < > 2.86*  --  3.26* 3.50*   < > 3.91*   < > 3.71* 3.38* 3.32* 3.23* 3.21*  CALCIUM 8.1*  --  8.2*  8.1*   < > 8.1*  --  7.9* 8.1*   < > 8.7*   < > 8.7* 8.5* 8.5* 8.4* 8.6*  MG  --    < > 2.4  --  2.6*  --   --  2.6*  --  2.5*  --  2.7*  --  2.4  --  2.6*  PHOS 3.3  --  2.9  --  3.8  --  5.1* 6.4*  --   --   --   --   --   --   --   --    < > = values in this interval not displayed.   GFR: Estimated Creatinine Clearance: 24.2 mL/min (A) (by C-G formula based on SCr of 3.21 mg/dL (H)). Liver Function Tests: Recent Labs  Lab 01/12/21 1557 01/13/21 0430 01/14/21 0450 01/14/21 1702 01/15/21 0333  ALBUMIN 2.5* 2.4* 2.3* 2.4* 2.3*   No results for input(s): LIPASE, AMYLASE in the last 168 hours. No results for input(s): AMMONIA in the last 168 hours. Coagulation Profile: No results for input(s): INR, PROTIME in the last 168 hours. Cardiac Enzymes: No results for input(s): CKTOTAL, CKMB, CKMBINDEX, TROPONINI  in the last 168 hours. BNP (last 3 results) No results for input(s): PROBNP in the last 8760 hours. HbA1C: No results for input(s): HGBA1C in the last 72 hours. CBG: Recent Labs  Lab 01/18/21 1520 01/18/21 2005 01/18/21 2352 01/19/21 0418 01/19/21 1124  GLUCAP 113* 120* 135* 143* 135*   Lipid Profile: No results for input(s): CHOL, HDL, LDLCALC, TRIG, CHOLHDL, LDLDIRECT in the last 72 hours. Thyroid Function Tests: No results for input(s): TSH, T4TOTAL, FREET4, T3FREE, THYROIDAB in the last 72 hours. Anemia Panel: No results for input(s): VITAMINB12, FOLATE, FERRITIN, TIBC, IRON, RETICCTPCT in the last 72 hours. Urine analysis:    Component Value Date/Time  COLORURINE YELLOW 01/03/2021 2200   APPEARANCEUR HAZY (A) 01/03/2021 2200   LABSPEC 1.032 (H) 01/03/2021 2200   PHURINE 5.0 01/03/2021 2200   GLUCOSEU >=500 (A) 01/03/2021 2200   HGBUR NEGATIVE 01/03/2021 2200   HGBUR negative 06/13/2010 0816   BILIRUBINUR NEGATIVE 01/03/2021 2200   BILIRUBINUR n 05/17/2019 0927   KETONESUR 5 (A) 01/03/2021 2244   PROTEINUR NEGATIVE 01/03/2021 2200   UROBILINOGEN 0.2 05/17/2019 0927   UROBILINOGEN 0.2 06/13/2010 0816   NITRITE NEGATIVE 01/03/2021 2200   LEUKOCYTESUR NEGATIVE 01/03/2021 2200   Sepsis Labs: @LABRCNTIP (procalcitonin:4,lacticidven:4)  ) Recent Results (from the past 240 hour(s))  Culture, Respiratory w Gram Stain     Status: None   Collection Time: 01/11/21 11:24 AM   Specimen: Tracheal Aspirate; Respiratory  Result Value Ref Range Status   Specimen Description TRACHEAL ASPIRATE  Final   Special Requests NONE  Final   Gram Stain   Final    ABUNDANT WBC PRESENT,BOTH PMN AND MONONUCLEAR ABUNDANT GRAM NEGATIVE RODS FEW GRAM VARIABLE ROD FEW YEAST    Culture   Final    MODERATE Normal respiratory flora-no Staph aureus or Pseudomonas seen Performed at [facility], [address], [place], [place] [address]    Report Status 01/13/2021 FINAL  Final   Culture, Respiratory w Gram Stain     Status: None   Collection Time: 01/13/21 12:01 PM   Specimen: Tracheal Aspirate; Respiratory  Result Value Ref Range Status   Specimen Description TRACHEAL ASPIRATE  Final   Special Requests NONE  Final   Gram Stain   Final    FEW WBC PRESENT, PREDOMINANTLY PMN FEW YEAST FEW GRAM NEGATIVE RODS    Culture   Final    RARE Normal respiratory flora-no Staph aureus or Pseudomonas seen Performed at [facility], [address] [place], [place] [address]    Report Status 01/15/2021 FINAL  Final  Culture, blood (routine x 2)     Status: None   Collection Time: 01/14/21 10:06 AM   Specimen: BLOOD  Result Value Ref Range Status   Specimen Description BLOOD BLOOD LEFT HAND  Final   Special Requests   Final    BOTTLES DRAWN AEROBIC AND ANAEROBIC Blood Culture results may not be optimal due to an inadequate volume of blood received in culture bottles   Culture   Final    NO GROWTH 5 DAYS Performed at [facility], [place] [address], [place], [place] [address]    Report Status 01/19/2021 FINAL  Final  Culture, blood (routine x 2)     Status: None   Collection Time: 01/14/21 10:06 AM   Specimen: BLOOD  Result Value Ref Range Status   Specimen Description BLOOD BLOOD RIGHT HAND  Final   Special Requests   Final    BOTTLES DRAWN AEROBIC AND ANAEROBIC Blood Culture results may not be optimal due to an inadequate volume of blood received in culture bottles   Culture   Final    NO GROWTH 5 DAYS Performed at [facility], [place] [address] [place], [place] [address]    Report Status 01/19/2021 FINAL  Final      Radiology Studies: No results found.   Scheduled Meds: . aspirin  81 mg Per Tube Daily  . Chlorhexidine Gluconate Cloth  6 each Topical Daily  . Chlorhexidine Gluconate Cloth  6 each Topical Q0600  . feeding supplement (PROSource TF)  90 mL Per Tube BID  . fiber  1 packet Per Tube BID  . free water  600 mL Per Tube Q6H  .  glycopyrrolate  1 mg Oral TID  . heparin injection (subcutaneous)  5,000 Units Subcutaneous Q8H  . hydrALAZINE  25 mg Per Tube Q8H  . isosorbide dinitrate  10 mg Per Tube TID  . mouth rinse  15 mL Mouth Rinse BID  . modafinil  100 mg Per Tube Daily  . pantoprazole sodium  40 mg Per Tube Daily  . polyethylene glycol  17 g Per Tube BID  . rosuvastatin  10 mg Per Tube Daily  . ticagrelor  90 mg Per Tube BID   Continuous Infusions: . feeding supplement (OSMOLITE 1.5 CAL) 1,000 mL (01/18/21 2358)     LOS: 16 days   Time Spent in minutes   45 minutes  Almeter Westhoff D.O. on 01/19/2021 at 12:48 PM  Between 7am to 7pm - Please see pager noted on [email or web address]  After 7pm go to [email or web address]  And look for the night coverage person covering for me after hours  [facility] Office  [phone]

## 2021-01-19 NOTE — Progress Notes (Signed)
   01/19/21 1700  Therapy Vitals  Pulse Rate 84  Resp (!) 24  Patient Position (if appropriate) Lying  MEWS Score/Color  MEWS Score 2  MEWS Score Color Yellow  Respiratory Assessment  Assessment Type Assess only  Respiratory Pattern Labored (congested, audible crackles from bedside.)  Chest Assessment Chest expansion symmetrical  Cough Productive  Sputum Amount Moderate  Sputum Color White;Clear  Sputum Consistency Thin  Sputum Specimen Source Nasal tracheal (Attempted left nare, pt refused, had pt cough up secretions, retrieved with yankuer and 14 french suction cath orally. Applied 3 L [place].)  Bilateral Breath Sounds Fine crackles  Oxygen Therapy/Pulse Ox  O2 Device Room Air (applied 3 L [place], Sp02 increased to 94%.)  FiO2 (%) 21 %  SpO2 90 %

## 2021-01-20 ENCOUNTER — Inpatient Hospital Stay (HOSPITAL_COMMUNITY): Payer: Medicare PPO

## 2021-01-20 DIAGNOSIS — Z515 Encounter for palliative care: Secondary | ICD-10-CM | POA: Diagnosis not present

## 2021-01-20 DIAGNOSIS — N170 Acute kidney failure with tubular necrosis: Secondary | ICD-10-CM | POA: Diagnosis not present

## 2021-01-20 DIAGNOSIS — Z7189 Other specified counseling: Secondary | ICD-10-CM | POA: Diagnosis not present

## 2021-01-20 DIAGNOSIS — G9340 Encephalopathy, unspecified: Secondary | ICD-10-CM | POA: Diagnosis not present

## 2021-01-20 DIAGNOSIS — N179 Acute kidney failure, unspecified: Secondary | ICD-10-CM | POA: Diagnosis not present

## 2021-01-20 DIAGNOSIS — R042 Hemoptysis: Secondary | ICD-10-CM

## 2021-01-20 DIAGNOSIS — J9601 Acute respiratory failure with hypoxia: Secondary | ICD-10-CM | POA: Diagnosis not present

## 2021-01-20 DIAGNOSIS — R570 Cardiogenic shock: Secondary | ICD-10-CM | POA: Diagnosis not present

## 2021-01-20 LAB — CBC
HCT: 24.7 % — ABNORMAL LOW (ref 39.0–52.0)
Hemoglobin: 8.2 g/dL — ABNORMAL LOW (ref 13.0–17.0)
MCH: 32.9 pg (ref 26.0–34.0)
MCHC: 33.2 g/dL (ref 30.0–36.0)
MCV: 99.2 fL (ref 80.0–100.0)
Platelets: 451 10*3/uL — ABNORMAL HIGH (ref 150–400)
RBC: 2.49 MIL/uL — ABNORMAL LOW (ref 4.22–5.81)
RDW: 17.3 % — ABNORMAL HIGH (ref 11.5–15.5)
WBC: 24.2 10*3/uL — ABNORMAL HIGH (ref 4.0–10.5)
nRBC: 0.1 % (ref 0.0–0.2)

## 2021-01-20 LAB — BASIC METABOLIC PANEL
Anion gap: 12 (ref 5–15)
BUN: 62 mg/dL — ABNORMAL HIGH (ref 8–23)
CO2: 26 mmol/L (ref 22–32)
Calcium: 8.1 mg/dL — ABNORMAL LOW (ref 8.9–10.3)
Chloride: 103 mmol/L (ref 98–111)
Creatinine, Ser: 2.46 mg/dL — ABNORMAL HIGH (ref 0.61–1.24)
GFR, Estimated: 27 mL/min — ABNORMAL LOW (ref >60)
Glucose, Bld: 119 mg/dL — ABNORMAL HIGH (ref 70–99)
Potassium: 4.7 mmol/L (ref 3.5–5.1)
Sodium: 141 mmol/L (ref 135–145)

## 2021-01-20 LAB — HEPATIC FUNCTION PANEL
ALT: 104 U/L — ABNORMAL HIGH (ref 0–44)
AST: 56 U/L — ABNORMAL HIGH (ref 15–41)
Albumin: 2.4 g/dL — ABNORMAL LOW (ref 3.5–5.0)
Alkaline Phosphatase: 109 U/L (ref 38–126)
Bilirubin, Direct: 0.1 mg/dL (ref 0.0–0.2)
Indirect Bilirubin: 0.6 mg/dL (ref 0.3–0.9)
Total Bilirubin: 0.7 mg/dL (ref 0.3–1.2)
Total Protein: 6.1 g/dL — ABNORMAL LOW (ref 6.5–8.1)

## 2021-01-20 LAB — GLUCOSE, CAPILLARY
Glucose-Capillary: 120 mg/dL — ABNORMAL HIGH (ref 70–99)
Glucose-Capillary: 123 mg/dL — ABNORMAL HIGH (ref 70–99)
Glucose-Capillary: 127 mg/dL — ABNORMAL HIGH (ref 70–99)
Glucose-Capillary: 130 mg/dL — ABNORMAL HIGH (ref 70–99)
Glucose-Capillary: 131 mg/dL — ABNORMAL HIGH (ref 70–99)
Glucose-Capillary: 146 mg/dL — ABNORMAL HIGH (ref 70–99)

## 2021-01-20 LAB — POCT I-STAT 7, (LYTES, BLD GAS, ICA,H+H)
Acid-Base Excess: 6 mmol/L — ABNORMAL HIGH (ref 0.0–2.0)
Bicarbonate: 30.1 mmol/L — ABNORMAL HIGH (ref 20.0–28.0)
Calcium, Ion: 1.09 mmol/L — ABNORMAL LOW (ref 1.15–1.40)
HCT: 23 % — ABNORMAL LOW (ref 39.0–52.0)
Hemoglobin: 7.8 g/dL — ABNORMAL LOW (ref 13.0–17.0)
O2 Saturation: 100 %
Patient temperature: 100.1
Potassium: 4.5 mmol/L (ref 3.5–5.1)
Sodium: 140 mmol/L (ref 135–145)
TCO2: 31 mmol/L (ref 22–32)
pCO2 arterial: 40.8 mmHg (ref 32.0–48.0)
pH, Arterial: 7.48 — ABNORMAL HIGH (ref 7.350–7.450)
pO2, Arterial: 473 mmHg — ABNORMAL HIGH (ref 83.0–108.0)

## 2021-01-20 LAB — HEPATITIS B CORE ANTIBODY, TOTAL: Hep B Core Total Ab: REACTIVE — AB

## 2021-01-20 LAB — PHOSPHORUS: Phosphorus: 6.2 mg/dL — ABNORMAL HIGH (ref 2.5–4.6)

## 2021-01-20 LAB — COOXEMETRY PANEL
Carboxyhemoglobin: 1.4 % (ref 0.5–1.5)
Methemoglobin: 1.1 % (ref 0.0–1.5)
O2 Saturation: 70.8 %
Total hemoglobin: 8.1 g/dL — ABNORMAL LOW (ref 12.0–16.0)

## 2021-01-20 LAB — MAGNESIUM: Magnesium: 1.9 mg/dL (ref 1.7–2.4)

## 2021-01-20 LAB — AMMONIA: Ammonia: 27 umol/L (ref 9–35)

## 2021-01-20 MED ORDER — LIDOCAINE HCL (PF) 1 % IJ SOLN
INTRAMUSCULAR | Status: AC
  Administered 2021-01-20: 2 mL
  Filled 2021-01-20: qty 5

## 2021-01-20 MED ORDER — LACTATED RINGERS IV BOLUS
1000.0000 mL | Freq: Once | INTRAVENOUS | Status: AC
  Administered 2021-01-20: 1000 mL via INTRAVENOUS

## 2021-01-20 MED ORDER — FREE WATER
300.0000 mL | Freq: Four times a day (QID) | Status: DC
  Administered 2021-01-20 – 2021-01-22 (×8): 300 mL

## 2021-01-20 MED ORDER — KETAMINE HCL 50 MG/5ML IJ SOSY: 50.0000 mg | PREFILLED_SYRINGE | Freq: Once | INTRAMUSCULAR | Status: AC

## 2021-01-20 MED ORDER — PROPOFOL 1000 MG/100ML IV EMUL
5.0000 ug/kg/min | INTRAVENOUS | Status: DC
  Administered 2021-01-20: 25 ug/kg/min via INTRAVENOUS
  Filled 2021-01-20: qty 100

## 2021-01-20 MED ORDER — LIDOCAINE HCL (PF) 1 % IJ SOLN: 2.0000 mL | Freq: Once | INTRAMUSCULAR | Status: AC

## 2021-01-20 MED ORDER — PROPOFOL 1000 MG/100ML IV EMUL
INTRAVENOUS | Status: AC
  Administered 2021-01-20: 50 ug/kg/min via INTRAVENOUS
  Filled 2021-01-20: qty 100

## 2021-01-20 MED ORDER — KETAMINE HCL 50 MG/5ML IJ SOSY
PREFILLED_SYRINGE | INTRAMUSCULAR | Status: AC
  Administered 2021-01-20: 50 mg via INTRAVENOUS
  Filled 2021-01-20: qty 5

## 2021-01-20 MED ORDER — FENTANYL CITRATE (PF) 100 MCG/2ML IJ SOLN
25.0000 ug | INTRAMUSCULAR | Status: DC | PRN
Start: 2021-01-20 — End: 2021-01-23

## 2021-01-20 MED ORDER — MIDAZOLAM HCL 2 MG/2ML IJ SOLN
INTRAMUSCULAR | Status: AC
  Filled 2021-01-20: qty 2

## 2021-01-20 MED ORDER — CHLORHEXIDINE GLUCONATE 0.12% ORAL RINSE (MEDLINE KIT)
15.0000 mL | Freq: Two times a day (BID) | OROMUCOSAL | Status: DC
  Administered 2021-01-20 – 2021-02-02 (×22): 15 mL via OROMUCOSAL

## 2021-01-20 MED ORDER — ROCURONIUM BROMIDE 10 MG/ML (PF) SYRINGE
PREFILLED_SYRINGE | INTRAVENOUS | Status: AC
  Filled 2021-01-20: qty 10

## 2021-01-20 MED ORDER — POLYETHYLENE GLYCOL 3350 17 G PO PACK: 17.0000 g | PACK | Freq: Every day | ORAL | Status: DC

## 2021-01-20 MED ORDER — ETOMIDATE 2 MG/ML IV SOLN
INTRAVENOUS | Status: AC
  Filled 2021-01-20: qty 20

## 2021-01-20 MED ORDER — FENTANYL CITRATE (PF) 100 MCG/2ML IJ SOLN
INTRAMUSCULAR | Status: AC
  Filled 2021-01-20: qty 2

## 2021-01-20 MED ORDER — ACETAMINOPHEN 160 MG/5ML PO SOLN
650.0000 mg | ORAL | Status: DC | PRN
  Administered 2021-01-20 – 2021-02-03 (×11): 650 mg
  Filled 2021-01-20 (×13): qty 20.3

## 2021-01-20 MED ORDER — FENTANYL CITRATE (PF) 100 MCG/2ML IJ SOLN
25.0000 ug | INTRAMUSCULAR | Status: DC | PRN
  Administered 2021-01-20: 50 ug via INTRAVENOUS
  Filled 2021-01-20: qty 2

## 2021-01-20 MED ORDER — KETAMINE HCL 50 MG/5ML IJ SOSY
PREFILLED_SYRINGE | INTRAMUSCULAR | Status: AC
  Administered 2021-01-20: 50 mg via INTRAVENOUS
  Filled 2021-01-20: qty 10

## 2021-01-20 MED ORDER — FENTANYL CITRATE (PF) 100 MCG/2ML IJ SOLN
50.0000 ug | Freq: Once | INTRAMUSCULAR | Status: AC
  Administered 2021-01-20: 50 ug via INTRAVENOUS

## 2021-01-20 MED ORDER — DOCUSATE SODIUM 50 MG/5ML PO LIQD
100.0000 mg | Freq: Two times a day (BID) | ORAL | Status: DC
  Administered 2021-01-20 – 2021-01-21 (×2): 100 mg
  Filled 2021-01-20 (×4): qty 10

## 2021-01-20 MED ORDER — INSULIN ASPART 100 UNIT/ML IJ SOLN
0.0000 [IU] | INTRAMUSCULAR | Status: DC
  Administered 2021-01-21 – 2021-01-23 (×15): 1 [IU] via SUBCUTANEOUS
  Administered 2021-01-24 (×2): 2 [IU] via SUBCUTANEOUS
  Administered 2021-01-24: 1 [IU] via SUBCUTANEOUS
  Administered 2021-01-24: 2 [IU] via SUBCUTANEOUS
  Administered 2021-01-24: 1 [IU] via SUBCUTANEOUS
  Administered 2021-01-25: 2 [IU] via SUBCUTANEOUS
  Administered 2021-01-25: 1 [IU] via SUBCUTANEOUS
  Administered 2021-01-25: 2 [IU] via SUBCUTANEOUS
  Administered 2021-01-25 (×2): 1 [IU] via SUBCUTANEOUS
  Administered 2021-01-25: 2 [IU] via SUBCUTANEOUS
  Administered 2021-01-26 – 2021-01-29 (×9): 1 [IU] via SUBCUTANEOUS

## 2021-01-20 MED ORDER — ORAL CARE MOUTH RINSE
15.0000 mL | OROMUCOSAL | Status: DC
  Administered 2021-01-20 – 2021-01-23 (×39): 15 mL via OROMUCOSAL

## 2021-01-20 NOTE — Procedures (Addendum)
Bronchoscopy  Name: [name] MRN: [id number] DOB: 06/13/1951 Procedure: Bronchoscopy Indications: Diagnostic evaluation of the airways In conjunction with: Dr. Earlie Server  Procedure Details Consent: Unable to obtain consent because of emergent medical necessity. Time Out: Verified patient identification, verified procedure, site/side was marked, verified correct patient position, special equipment/implants available, medications/allergies/relevent history reviewed, required imaging and test results available.  Performed  In preparation for procedure, patient was given 100% FiO2, bronchoscope lubricated and lidocaine given via ETT (2 ml). Sedation: Propofol infusion  Airway entered and the following bronchi were examined: RUL, RML, RLL, LUL and LLL. Entirety of observable airways appear inflamed and friable, but no clear focus of blood identified. Source seems to be upper airway, either proximal to, or obscured by, the endotracheal tube.    No overt complications. Bronchoscope removed.    Evaluation Hemodynamic Status: BP stable throughout; O2 sats: stable throughout Patient's Current Condition: stable Specimens:  None Complications: No apparent complications Patient did tolerate procedure well.    Georgann Housekeeper, AGACNP-BC [facility]  See Amion for personal pager PCCM on call pager [phone] until 7pm. Please call Elink 7p-7a. [phone]  01/20/2021 1:14 AM

## 2021-01-20 NOTE — Progress Notes (Signed)
Patient ID: Pearce Littlefield, male   DOB: 30-Jul-1951, 70 y.o.   MRN: [id number]     Advanced Heart Failure Rounding Note  PCP-Cardiologist: No primary care provider on file.   Subjective:    4/20 Impella removed. MRI brain with numerous micro embolic infarctions. Off pressors.  4/21 lasix drip stopped.  4/22 started CVVH 4/24 CVVH stopped. Failed extubation. Re-intubated.  4/26 Extubated  5/1 Re-intubated  Reintubated overnight as unable to manage secretions.    Awake on vent. Will track voice and nod but not follow commands reliably. HCPOA met  With Palliatvie today and plan for full code with trach this week.  Off pressors   Objective:   Weight Range: 85 kg Body mass index is 24.72 kg/m.   Vital Signs:   Temp:  [98.1 F (36.7 C)-100.5 F (38.1 C)] 100.5 F (38.1 C) (05/01 1600) Pulse Rate:  [76-110] 82 (05/01 0700) Resp:  [10-39] 21 (05/01 1600) BP: (82-179)/(48-99) 131/60 (05/01 1600) SpO2:  [87 %-100 %] 98 % (05/01 1519) FiO2 (%):  [36 %-100 %] 40 % (05/01 1519) Last BM Date: 01/20/21  Weight change: Filed Weights   01/19/21 0346 01/19/21 0635 01/19/21 1014  Weight: 86.9 kg 85 kg 85 kg    Intake/Output:   Intake/Output Summary (Last 24 hours) at 01/20/2021 1701 Last data filed at 01/20/2021 1600 Gross per 24 hour  Intake 4013.04 ml  Output 1585 ml  Net 2428.04 ml      Physical Exam   General: Sitting up in bed nonverbal. Will track but not follow commands HEENT: normal +ETT Neck: supple. LIJ HD cath Carotids 2+ bilat; no bruits. No lymphadenopathy or thryomegaly appreciated. Cor: PMI nondisplaced. Regular rate & rhythm. No rubs, gallops or murmurs. Lungs: coarse Abdomen: soft, nontender, nondistended. No hepatosplenomegaly. No bruits or masses. Good bowel sounds. Extremities: no cyanosis, clubbing, rash, edema Neuro: as above  Telemetry   Sinus 80s Personally reviewed  Labs    CBC Recent Labs    01/19/21 0626 01/20/21 0122 01/20/21 0140   WBC 12.7* 24.2*  --   HGB 8.8* 8.2* 7.8*  HCT 27.5* 24.7* 23.0*  MCV 97.2 99.2  --   PLT 478* 451*  --    Basic Metabolic Panel Recent Labs    01/19/21 0500 01/20/21 0122 01/20/21 0140  NA 156* 141 140  K 4.0 4.7 4.5  CL 119* 103  --   CO2 25 26  --   GLUCOSE 154* 119*  --   BUN 112* 62*  --   CREATININE 3.21* 2.46*  --   CALCIUM 8.6* 8.1*  --   MG 2.6* 1.9  --   PHOS  --  6.2*  --    Liver Function Tests Recent Labs    01/20/21 0421  AST 56*  ALT 104*  ALKPHOS 109  BILITOT 0.7  PROT 6.1*  ALBUMIN 2.4*   No results for input(s): LIPASE, AMYLASE in the last 72 hours. Cardiac Enzymes No results for input(s): CKTOTAL, CKMB, CKMBINDEX, TROPONINI in the last 72 hours.  BNP: BNP (last 3 results) No results for input(s): BNP in the last 8760 hours.  ProBNP (last 3 results) No results for input(s): PROBNP in the last 8760 hours.   D-Dimer No results for input(s): DDIMER in the last 72 hours. Hemoglobin A1C No results for input(s): HGBA1C in the last 72 hours. Fasting Lipid Panel No results for input(s): CHOL, HDL, LDLCALC, TRIG, CHOLHDL, LDLDIRECT in the last 72 hours. Thyroid Function Tests No results  for input(s): TSH, T4TOTAL, T3FREE, THYROIDAB in the last 72 hours.  Invalid input(s): FREET3  Other results:   Imaging    Portable Chest x-ray  Result Date: 01/20/2021 CLINICAL DATA:  Initial evaluation for endotracheal tube placement. EXAM: PORTABLE CHEST 1 VIEW COMPARISON:  Prior radiograph from 01/19/2021. FINDINGS: There has been interval placement of an endotracheal tube, tip positioned 3.9 cm above the carina. Feeding tube courses into the abdomen. Left-sided central venous catheter stable. Cardiac and mediastinal silhouettes are stable, and remains within normal limits. Aortic atherosclerosis. Lungs normally inflated and remain clear. No focal infiltrates. No edema or effusion. No pneumothorax. Osseous structures are unchanged. IMPRESSION: 1. Interval  placement of endotracheal tube with tip well positioned 3.9 cm above the carina. 2. Otherwise stable appearance of the chest with no active cardiopulmonary disease identified. Electronically Signed   By: Jeannine Boga M.D.   On: 01/20/2021 02:25   DG Chest Port 1 View  Result Date: 01/19/2021 CLINICAL DATA:  Rhonchi EXAM: PORTABLE CHEST 1 VIEW COMPARISON:  01/13/2021 FINDINGS: Single frontal view of the chest demonstrates interval removal of the endotracheal tube and right internal jugular Cordis. Enteric catheter passes below diaphragm tip excluded by collimation. Left internal jugular catheter tip projects over superior vena cava. The cardiac silhouette is unremarkable. No acute airspace disease, effusion, or pneumothorax. No acute bony abnormalities. IMPRESSION: 1. Support devices as above. 2. Interval resolution of bibasilar consolidation. No acute intrathoracic process. Electronically Signed   By: Randa Ngo M.D.   On: 01/19/2021 20:45     Medications:     Scheduled Medications: . aspirin  81 mg Per Tube Daily  . chlorhexidine gluconate (MEDLINE KIT)  15 mL Mouth Rinse BID  . Chlorhexidine Gluconate Cloth  6 each Topical Daily  . Chlorhexidine Gluconate Cloth  6 each Topical Q0600  . docusate  100 mg Per Tube BID  . feeding supplement (PROSource TF)  90 mL Per Tube BID  . fiber  1 packet Per Tube BID  . free water  300 mL Per Tube Q6H  . mouth rinse  15 mL Mouth Rinse 10 times per day  . modafinil  100 mg Per Tube Daily  . pantoprazole sodium  40 mg Per Tube Daily  . polyethylene glycol  17 g Per Tube BID  . rosuvastatin  10 mg Per Tube Daily  . ticagrelor  90 mg Per Tube BID    Infusions: . feeding supplement (OSMOLITE 1.5 CAL) 1,000 mL (01/20/21 0236)    PRN Medications: sodium chloride, acetaminophen (TYLENOL) oral liquid 160 mg/5 mL, atropine, fentaNYL (SUBLIMAZE) injection, fentaNYL (SUBLIMAZE) injection, ipratropium-albuterol, ondansetron (ZOFRAN) IV, sodium  chloride flush    Assessment/Plan   1. CAD/ Acute Inferior STEMI - emergent cath w/ pRCA occlusion s/p PCI + DES - Initial angiography showed sluggish flow in LAD and LCX>>improved post impella placement  - Echo LVEF 20-25%, RV moderately reduced - Continue DAPT/statin - nno s/s angina   2. VF Arrest - in setting of acute MI, now s/p revascularization  - rhythm stable.  - appears to have significant anoxic brain injury.  3. Shock, Combination Cardiogenic + Hemorrhagic  - Acute MI + developed severe hemorrhagic shock due to bleeding at left groin Impella Transfused 4U PRBCs . - Impella out 4/20  - resolved  4. Acute Systolic Heart Failure>>Cardiogenic Shock  - ICM Echo LVEF 20-25%, RV moderately reduced - Impella out 4/20. Off all pressors. Co-ox 62% - CVVH stopped 4/24.  - No bb with  shock.  - Off hydralazine and imdur with low BP. Can restart as tolerated.   5. Acute hypoxic respiratory failure - 4/24 failed extubation trial.  -5/1 reintubated due to inability to handle secretions - plan trach this week   5. Nonoliguric AKI - due to ATN/shock - CVVH begun 4/22 and stopped 4/24. Had one session of iHD on 4/30 for clearance - Now watchful waiting. Not candidate for outpatient HD  6. Anoxic brain injury  - EEG w/ generalized periodic discharges with triphasic morphology + moderate to severe diffuse encephalopathy.  - MRI- numerous micro embolic infarctions . No hemorrhage or mass.  - CIR has turned down due to poor rehab potential. Recommending SNF - He appears to have significant anoxic injury. Will track voice and nod at time but does not seem to follow commands. Also aphasic - HCPOA has met with Palliative Care team plan for Full Code for now with trach later this week - I think potential for meaning full recovery is quite low. I will discuss with them tomorrow again   7. Acute blood loss anemia  -Received 1u on 4/25.   - Hgb stable - Follow daily CBC. - kepp Hgb  >= 7.5  8. Hypernatremia - FW per Nephrology  CRITICAL CARE Performed by: Glori Bickers  Total critical care time: 45 minutes  Critical care time was exclusive of separately billable procedures and treating other patients.  Critical care was necessary to treat or prevent imminent or life-threatening deterioration.  Critical care was time spent personally by me (independent of midlevel providers or residents) on the following activities: development of treatment plan with patient and/or surrogate as well as nursing, discussions with consultants, evaluation of patient's response to treatment, examination of patient, obtaining history from patient or surrogate, ordering and performing treatments and interventions, ordering and review of laboratory studies, ordering and review of radiographic studies, pulse oximetry and re-evaluation of patient's condition.     Length of Stay: 17  Glori Bickers, MD  5:01 PM

## 2021-01-20 NOTE — Progress Notes (Signed)
Palliative Medicine Inpatient Follow Up Note  Reason for consult: Discuss goals of care  HPI:  Per intake H&P --> [name] has been admitted to [facility] for the past 16 days.  He has had a very complicated hospitalization inclusive of an intensive care unit stay for cardiogenic shock he also went into V. fib arrest and required multiple CPR episodes.  He has been medically optimized and is now on the cardiac unit though there is concern regarding his his MRI scan for anoxic brain injury.  Palliative care has been asked to get involved in the setting of suspected anoxic brain injury to further discuss goals of care with patient's power of attorney, Merry Proud.  Today's Discussion (01/20/2021):  *Please note that this is a verbal dictation therefore any spelling or grammatical errors are due to the "[place] [facility]" system interpretation. Chart reviewed.  Patient had a RRT this morning in the setting of hemoptysis and inability to clear secretions. He was emergently intubated and transferred to the ICU where he remains presently.  Family Meeting 5/1 @ 1400:  Participants: [name] [facility])  Providers Present: Tacey Ruiz, [facility]  I met at bedside with patient's [facility] POA, [name].  We reviewed the events which led to [name] being reintubated and transferred to the intensive care unit.   Merry Proud shares with me that this is the first day he is noted that [name] has been "so alert".  He shares that when asked questions [name] does appear to be able to nod yes or no which is a dramatic improvement from the days prior when he had been seen.   Reviewed [name]'s complex hospitalization again, his cardiogenic shock, V. fib arrest, and multiple episodes of CPR.  Advocated strong consideration of completing MOST form in terms of goals moving forward.  Discussed the idea of tracheostomy should this become needed.  Merry Proud shares with me that few weeks ago this is  something that was also broached and identified as a possibility.  At this point in time it does appear that Merry Proud would wish to continue with a tracheostomy though this can be further discussed with the intensivist team.   Provided emotional support given the "roller coaster" that [name] is on and how this affects those to care for him inclusive of [name].  Offered time for him to expresses emotions through therapeutic listening.  I shared that the palliative care team will continue to follow throughout [facility] hospitalization.  At this time the goals remain to optimize [name]'s condition and further identify if additional improvements can be made moving forward  We reviewed that if [name] does not thrive and has further declines then we could reapproach the topic of wishes relying on his living will as an additional modality of support decision making.  Questions and concerns addressed   Objective Assessment: Vital Signs Vitals:   01/20/21 0700 01/20/21 0812  BP: (!) 112/59   Pulse: 82   Resp: (!) 24   Temp: 98.4 F (36.9 C)   SpO2: 97% 98%    Intake/Output Summary (Last 24 hours) at 01/20/2021 1014 Last data filed at 01/20/2021 0800 Gross per 24 hour  Intake 3583.04 ml  Output 1250 ml  Net 2333.04 ml   Last Weight  Most recent update: 01/19/2021 10:34 AM   Weight  85 kg (187 lb 6.3 oz)           Gen: Elderly ill-appearing male HEENT: ETT, Core-track,, dry mucous membranes CV: Regular rate  and rhythm PULM:  On ventilator ABD: soft/nontender/nondistended  EXT: No edema  Neuro: Opens eyes   SUMMARY OF RECOMMENDATIONS Full Code  Advance directives will be scanned into [employer]  Continue to provide full scope of treatment to identify if improvements can be made - Watchful waiting  HCPOA is open to a tracheostomy if this is needed  Ongoing palliative care support  Time Spent: 35 Greater than 50% of the time was spent in counseling and coordination of  care ______________________________________________________________________________________ [place] Team Team Cell Phone: [phone] Please utilize secure chat with additional questions, if there is no response within 30 minutes please call the above phone number  Palliative Medicine Team providers are available by phone from 7am to 7pm daily and can be reached through the team cell phone.  Should this patient require assistance outside of these hours, please call the patient's attending physician.

## 2021-01-20 NOTE — Procedures (Signed)
Intubation Procedure Note  [name]  [id number]  Oct 14, 1950  Date:01/20/21  Time:1:06 AM   Provider Performing:Emmanuelle Hibbitts W Heber Yoakum    Procedure: Intubation (32122)  Indication(s) Respiratory Failure  Consent Risks of the procedure as well as the alternatives and risks of each were explained to the patient and/or caregiver.  Consent for the procedure was obtained and is signed in the bedside chart   Anesthesia Fentanyl 78mcg Ketamine 100mg    Time Out Verified patient identification, verified procedure, site/side was marked, verified correct patient position, special equipment/implants available, medications/allergies/relevant history reviewed, required imaging and test results available.   Sterile Technique Usual hand hygeine, masks, and gloves were used   Procedure Description Patient positioned in bed supine.  Sedation given as noted above.  Patient was intubated with endotracheal tube using Glidescope.  View was Grade 2 only posterior commissure .  Number of attempts was 1.  Colorimetric CO2 detector was consistent with tracheal placement.   Complications/Tolerance None; patient tolerated the procedure well. Chest X-ray is ordered to verify placement.   EBL Minimal   Specimen(s) None   Georgann Housekeeper, AGACNP-BC [place] [facility]  See Amion for personal pager PCCM on call pager [phone] until 7pm. Please call Elink 7p-7a. [phone]  01/20/2021 1:07 AM

## 2021-01-20 NOTE — Progress Notes (Signed)
NAME:  Dayyan Krist, MRN:  [id number], DOB:  [birth year], LOS: 54 ADMISSION DATE:  01/03/2021, CONSULTATION DATE:  01/03/2021 REFERRING MD:  Dr. Haroldine Laws, CHIEF COMPLAINT:  STEMI/ cardiogenic shock  History of Present Illness:   70 year old male with PMHx HTN, HLD, CAD admitted as a STEMI who suffered cardiac arrest in hospital prior to emergent cardiac cath. PCI with stent to proximal RCA. EF 25-30%. Prolonged ICU stay for cardiogenic/hemorrhagic shock requiring vasopressors and transfusions. Mental status did not greatly improve as his hemodynamics did. MRI showed scattered punctate infarctions concerning for anoxic injury. AKI progressed to require CRRT, he has since been transitioned to [facility]. Extubated 4/26. Did not pass swallow due to aspiration risk. 4/30 he developed worsening hypoxia and developed hemoptysis with multiple large clots being expectorated.   Pertinent  Medical History  HTN, HLD, CAD  Significant Hospital Events: Including procedures, antibiotic start and stop dates in addition to other pertinent events   . 4/14 Admitted Cards with inferior STEMI-> bradycardia s/p TVP, cardiogenic shock and cardiac arrest s/p impella with PCI/stent to RCA, complicated by bleeding s/p 4 units PRBC thought 2/2 cangrelor, pending coags.  Has R IJ PA cath, TVP R groin, impella L groin . 4/15 Remains on impella, weaning pressors and inotropes. Started diuresis due to significantly increased oxygen requirements. . 4/18 Concern for seizure-like activity, Neuro consult, LTM/EEG, CT Head NAICA/no evidence of edema. Transitioned to ceftriaxone from cefepime. . 4/19 Remains on Impella, off pressors. Continuing diuresis with hopeful discontinuation of Impella, allowing for MRI 4/20-4/21. Continuing LTM EEG, given facial twitching/unclear neurologic status. . 4/20 >8L UOP/24H with Lasix gtt, Impella at P6, MRI Brain pending Impella removal . 4/24 CRRT stopped, reintubated . 4/25 - weaned all day. Good  response to diuretics . 5/1 transferred back to ICU for hemoptysis, hypoxic resp failure. Intubated emergently.   Interim History / Subjective:    Objective   Blood pressure (!) 128/56, pulse 89, temperature 98.1 F (36.7 C), temperature source Oral, resp. rate (!) 35, height 6\' 1"  (1.854 m), weight 85 kg, SpO2 93 %. CVP:  [5 mmHg] 5 mmHg  FiO2 (%):  [21 %-36 %] 36 %   Intake/Output Summary (Last 24 hours) at 01/20/2021 0116 Last data filed at 01/20/2021 0017 Gross per 24 hour  Intake 50 ml  Output 1597 ml  Net -1547 ml   Filed Weights   01/19/21 0346 01/19/21 0635 01/19/21 1014  Weight: 86.9 kg 85 kg 85 kg    Physical Examination: General:  Elderly appearing male in NAD Neuro: alert, not-oriented, non-verbal.   HEENT:  Superior/AT, No JVD noted, PERRL Cardiovascular:  RRR, no MRG Lungs:  Coarse throughout. Hemoptysis. Expectorating clots.  Abdomen:  Soft, non-distended Musculoskeletal:  No acute deformity Skin:  Intact, MMM   Labs/imaging that I have personally reviewed: (right click and "Reselect all SmartList Selections" daily)   Labs pending ABG 7.48, 37.5, 32.6, 28.1 (suspect this is venous)  Resolved Hospital Problem list   Hemorrhagic shock Lactic acidosis  Assessment & Plan:   Acute hypoxemic respiratory failure  Aspiration of blood into airway. Epistaxis vs upper airway bleed.  Poor airway protection - STAT intubation - Full vent support - CXR and ABG - VAP bundle - Zosyn stopped 4/25 - Propofol and PRN fentanyl for RASS goal -1 to -2.   Acute inferior STEMI post PCI and stenting RCA, present on admission Cardiogenic shock, present on admission now resolved. Ventricular fibrillation, cardiac arrest Acute systolic heart failure, present on admission now  resolved. - Holding isosorbide, hydralazine while hypotensive.  - Keep dual antiplatelet therapy aspirin plus Brilinta as unclear that this is true hemoptysis. Low threshold to DC if bleeding works.  -  Continue crestor per tube - Cardiology following  AKI - appears euvolemic at this time Hypernatremia - Nephrology following - Intermittent HD - Free water flushes increased  Anoxic brain injury- having some improvement on exam so giving more time Acute bilateral strokes, multiple microembolic infarctions on MRI Initial EEG negative, Twitching- no EEG correlate so thought less likely seizures - Supportive care - check ammonia - provigil  GOC - palliative following. Meeting later today.    Best practice (right click and "Reselect all SmartList Selections" daily)  Diet:  Tube Feed  Pain/Anxiety/Delirium protocol (if indicated): Yes (RASS goal -1,-2)  VAP protocol (if indicated): Yes DVT prophylaxis: Contraindicated GI prophylaxis: PPI will add fiber for diarrhea. Glucose control:  SSI No and Basal insulin No Control is adequate.  Central venous access:  Yes, and it is still needed  Arterial line:  N/A Foley:  N/A  Mobility:  bed rest PT consulted: N/A Last date of multidisciplinary goals of care discussion daily with POA 4/30 with palliative Code Status:  full code Disposition: ICU   Critical care time 45 minutes  Georgann Housekeeper, AGACNP-BC [place] for personal pager PCCM on call pager [phone] until 7pm Please call Elink 7p-7a. [phone]  01/20/2021 1:47 AM

## 2021-01-20 NOTE — Progress Notes (Signed)
Asked by PCCM to expedite pt tx to ICU d/t need for intubation. Pt txed to 67M10 with PCCM MD and NP, 3E RN, and RR RN. Pt intubated upon arrived to ICU.

## 2021-01-20 NOTE — Progress Notes (Signed)
   01/20/21 0812  Airway 8 mm  Placement Date/Time: 01/20/21 0036   Inserted prior to hospital arrival?: (c) Other (Comment)  Placed By: ICU physician  Airway Device: Endotracheal Tube  Laryngoscope Blade: 4  ETT Types: Endobronchial  Size (mm): 8 mm  Cuffed: Cuffed  Insertion atte...  Secured at (cm) 28 cm  Measured From [place] By [employer] Tube Holder  Tube Holder Repositioned Yes  Prone position No  Cuff Pressure (cm H2O) Green OR 18-26 CmH2O  Site Condition Dry  Adult Ventilator Settings  Vent Type Servo i  Humidity HME  Vent Mode CPAP;PSV  FiO2 (%) 40 %  Pressure Support 5 cmH20  PEEP 5 cmH20  Adult Ventilator Measurements  Peak Airway Pressure 11 L/min  Mean Airway Pressure 7 cmH20  Resp Rate Spontaneous 23 br/min  Resp Rate Total 23 br/min  Spont TV 522 mL  Measured Ve 12 mL  Auto PEEP 0 cmH20  Total PEEP 5 cmH20  SpO2 98 %  Adult Ventilator Alarms  Alarms On Y  Ve High Alarm 20 L/min  Ve Low Alarm 5 L/min  Resp Rate High Alarm 35 br/min  Resp Rate Low Alarm 8  PEEP Low Alarm 3 cmH2O  Press High Alarm 45 cmH2O  T Apnea 20 sec(s)  VAP Prevention  HME changed Yes  Daily Weaning Assessment  Daily Assessment of Readiness to Wean Wean protocol criteria met (SBT performed)  SBT Method CPAP 5 cm H20 and PS 5 cm H20  Weaning Start Time 0812  Patient response Passed (Tolerated well)  Breath Sounds  Bilateral Breath Sounds Rhonchi  Airway Suctioning/Secretions  Suction Type ETT  Suction Device  Catheter  Secretion Amount Small  Secretion Color Clear (bloody)  Secretion Consistency Thick  Suction Tolerance Tolerated well  Suctioning Adverse Effects None

## 2021-01-20 NOTE — Progress Notes (Signed)
[facility] Progress Note Patient Name: [name] DOB: [birth year] MRN: [id number]   Date of Service  01/20/2021  HPI/Events of Note  RN requesting order for a BMP and CBC for morning labs and tube feeding insulin coverage  eICU Interventions  Order placed      Intervention Category Major Interventions: Other:;Hyperglycemia - active titration of insulin therapy  Margaretmary Lombard 01/20/2021, 11:47 PM

## 2021-01-20 NOTE — Progress Notes (Signed)
Overnight patient began to decompensate and was noted to have hemoptysis.  PCCM consulted, patient underwent emergent intubation and currently in the ICU.  Bronchoscopy was also performed, and blood was suctioned and noted to coming from the upper airway not true hemoptysis.  Discussed with Dr. Tacy Learn, PCCM, he has graciously assumed care for this patient.  Will follow peripherally as patient does have a palliative care meeting planned for this afternoon.   Jayda White D.O. [facility]  If 7PM-7AM, please contact night-coverage [email or web address] 01/20/2021, 11:33 AM

## 2021-01-20 NOTE — Progress Notes (Signed)
Patient was admitted early morning with increasing shortness of breath and was found to have hemoptysis.  He required endotracheal intubation and bronchoscopy was done which showed no active bleeding and lower airway maybe he was having epistaxis.  Patient remained encephalopathic, not following commands on minimal vent setting Palliative care meeting is a scheduled with family at 2 PM Continue current plan of care    Jacky Kindle MD [place] for pager If no response to pager, please call [phone] until 7pm After 7pm, Please call E-link [phone]

## 2021-01-20 NOTE — Progress Notes (Signed)
[facility]ESS [facility]  Assessment/ Plan: Pt is a 70 y.o. yo male with history of HTN, HLD, CAD presented with chest pain found to have STEMI.  He underwent left heart cath, placement of Impella device, had V. fib arrest, mechanical ventilation for respiratory failure and AKI.  #Acute kidney injury due to cardiogenic/hemorrhagic shock.  UA bland and kidney ultrasound unremarkable.  Required CRRT from 4/22-4/24.  Patient is nonoliguric and volume status acceptable.  Received intermittent hemodialysis on 4/30 for clearance.  BUN is down.  Noted patient is now back to ICU and reintubated.  Continue to watch for renal recovery, hopefully patient will not need further dialysis.  I do not think he is a candidate for outpatient dialysis therefore recommend palliative consult follow-up.  Discussed with the primary team.   #Hypernatremia due to increase free water excretion and recovering AKI.  Serum sodium level improved therefore I am reducing free water.  #CAD/acute STEMI required emergent cath with stent placement.  EF 20 to 25%.  Cardiology following.  #V. fib arrest/cardiogenic shock, acute systolic CHF: Arrest in the setting of acute MI.  Impella was removed.  Now off of pressors or inotropes.  Volume status is acceptable.  #Ventilator dependent respiratory failure: Initially extubated and transferred to floor.  Now with hemoptysis and needed reintubation.  Management per pulmonary team.  #Anemia due to acute blood loss: Required multiple blood transfusion.  Transfuse as needed.  #Hypokalemia: Potassium level improved.  #Encephalopathy: Concerned about anoxic brain injury as he does not have much improvement.  Had dialysis yesterday for clearance however patient is now needing intubation and sedation.  Subjective: Seen and examined.  Event noted.  Patient is now back to ICU and reintubated.  Currently sedated.  Unable to obtain review of system.  Discussed with the  bedside nurse.  Objective Vital signs in last 24 hours: Vitals:   01/20/21 0630 01/20/21 0645 01/20/21 0700 01/20/21 0812  BP: 105/60 119/63 (!) 112/59   Pulse: 83 83 82   Resp: (!) 22 (!) 26 (!) 24   Temp:   98.4 F (36.9 C)   TempSrc:   Oral   SpO2: 98% 98% 97% 98%  Weight:      Height:       Weight change: -1.9 kg  Intake/Output Summary (Last 24 hours) at 01/20/2021 0830 Last data filed at 01/20/2021 0813 Gross per 24 hour  Intake 3463.04 ml  Output 1247 ml  Net 2216.04 ml       Labs: Basic Metabolic Panel: Recent Labs  Lab 01/14/21 1702 01/15/21 0333 01/15/21 0334 01/18/21 1700 01/19/21 0500 01/20/21 0122 01/20/21 0140  NA 140 139   < > 151* 156* 141 140  K 3.6 3.3*   < > 3.9 4.0 4.7 4.5  CL 99 101   < > 119* 119* 103  --   CO2 24 24   < > 23 25 26   --   GLUCOSE 169* 156*   < > 171* 154* 119*  --   BUN 90* 105*   < > 118* 112* 62*  --   CREATININE 3.26* 3.50*   < > 3.23* 3.21* 2.46*  --   CALCIUM 7.9* 8.1*   < > 8.4* 8.6* 8.1*  --   PHOS 5.1* 6.4*  --   --   --  6.2*  --    < > = values in this interval not displayed.   Liver Function Tests: Recent Labs  Lab 01/14/21 1702  01/15/21 0333 01/20/21 0421  AST  --   --  56*  ALT  --   --  104*  ALKPHOS  --   --  109  BILITOT  --   --  0.7  PROT  --   --  6.1*  ALBUMIN 2.4* 2.3* 2.4*   No results for input(s): LIPASE, AMYLASE in the last 168 hours. Recent Labs  Lab 01/20/21 0518  AMMONIA 27   CBC: Recent Labs  Lab 01/15/21 0333 01/15/21 0334 01/16/21 0448 01/17/21 0417 01/19/21 0626 01/20/21 0122 01/20/21 0140  WBC 16.5*  --  14.4* 11.7* 12.7* 24.2*  --   HGB 8.7*   < > 9.0* 8.3* 8.8* 8.2* 7.8*  HCT 26.5*   < > 28.2* 26.1* 27.5* 24.7* 23.0*  MCV 89.8  --  92.8 93.2 97.2 99.2  --   PLT 272  --  386 394 478* 451*  --    < > = values in this interval not displayed.   Cardiac Enzymes: No results for input(s): CKTOTAL, CKMB, CKMBINDEX, TROPONINI in the last 168 hours. CBG: Recent Labs   Lab 01/19/21 1654 01/19/21 2108 01/19/21 2348 01/20/21 0422 01/20/21 0725  GLUCAP 113* 146* 125* 146* 131*    Iron Studies:  No results for input(s): IRON, TIBC, TRANSFERRIN, FERRITIN in the last 72 hours. Studies/Results: Portable Chest x-ray  Result Date: 01/20/2021 CLINICAL DATA:  Initial evaluation for endotracheal tube placement. EXAM: PORTABLE CHEST 1 VIEW COMPARISON:  Prior radiograph from 01/19/2021. FINDINGS: There has been interval placement of an endotracheal tube, tip positioned 3.9 cm above the carina. Feeding tube courses into the abdomen. Left-sided central venous catheter stable. Cardiac and mediastinal silhouettes are stable, and remains within normal limits. Aortic atherosclerosis. Lungs normally inflated and remain clear. No focal infiltrates. No edema or effusion. No pneumothorax. Osseous structures are unchanged. IMPRESSION: 1. Interval placement of endotracheal tube with tip well positioned 3.9 cm above the carina. 2. Otherwise stable appearance of the chest with no active cardiopulmonary disease identified. Electronically Signed   By: Jeannine Boga M.D.   On: 01/20/2021 02:25   DG Chest Port 1 View  Result Date: 01/19/2021 CLINICAL DATA:  Rhonchi EXAM: PORTABLE CHEST 1 VIEW COMPARISON:  01/13/2021 FINDINGS: Single frontal view of the chest demonstrates interval removal of the endotracheal tube and right internal jugular Cordis. Enteric catheter passes below diaphragm tip excluded by collimation. Left internal jugular catheter tip projects over superior vena cava. The cardiac silhouette is unremarkable. No acute airspace disease, effusion, or pneumothorax. No acute bony abnormalities. IMPRESSION: 1. Support devices as above. 2. Interval resolution of bibasilar consolidation. No acute intrathoracic process. Electronically Signed   By: Randa Ngo M.D.   On: 01/19/2021 20:45    Medications: Infusions: . feeding supplement (OSMOLITE 1.5 CAL) 1,000 mL (01/20/21  0236)  . propofol (DIPRIVAN) infusion 25 mcg/kg/min (01/20/21 2505)    Scheduled Medications: . aspirin  81 mg Per Tube Daily  . chlorhexidine gluconate (MEDLINE KIT)  15 mL Mouth Rinse BID  . Chlorhexidine Gluconate Cloth  6 each Topical Daily  . Chlorhexidine Gluconate Cloth  6 each Topical Q0600  . docusate  100 mg Per Tube BID  . feeding supplement (PROSource TF)  90 mL Per Tube BID  . fiber  1 packet Per Tube BID  . free water  600 mL Per Tube Q6H  . mouth rinse  15 mL Mouth Rinse 10 times per day  . modafinil  100 mg Per Tube Daily  .  pantoprazole sodium  40 mg Per Tube Daily  . polyethylene glycol  17 g Per Tube BID  . rosuvastatin  10 mg Per Tube Daily  . ticagrelor  90 mg Per Tube BID    have reviewed scheduled and prn medications.  Physical Exam: General: Critically ill looking male sedated, intubated Heart:RRR, s1s2 nl, no rubs Lungs: Coarse breath sound bilateral Abdomen:soft,  non-distended Extremities:No edema Dialysis Access: L IJ temporary HD catheter  Suren Payne Prasad Mykaila Blunck 01/20/2021,8:30 AM  LOS: 17 days

## 2021-01-21 DIAGNOSIS — Z515 Encounter for palliative care: Secondary | ICD-10-CM | POA: Diagnosis not present

## 2021-01-21 DIAGNOSIS — N170 Acute kidney failure with tubular necrosis: Secondary | ICD-10-CM | POA: Diagnosis not present

## 2021-01-21 DIAGNOSIS — R042 Hemoptysis: Secondary | ICD-10-CM | POA: Diagnosis not present

## 2021-01-21 DIAGNOSIS — Z978 Presence of other specified devices: Secondary | ICD-10-CM | POA: Diagnosis not present

## 2021-01-21 DIAGNOSIS — R570 Cardiogenic shock: Secondary | ICD-10-CM | POA: Diagnosis not present

## 2021-01-21 DIAGNOSIS — J9601 Acute respiratory failure with hypoxia: Secondary | ICD-10-CM | POA: Diagnosis not present

## 2021-01-21 DIAGNOSIS — I10 Essential (primary) hypertension: Secondary | ICD-10-CM | POA: Insufficient documentation

## 2021-01-21 LAB — GLUCOSE, CAPILLARY
Glucose-Capillary: 125 mg/dL — ABNORMAL HIGH (ref 70–99)
Glucose-Capillary: 132 mg/dL — ABNORMAL HIGH (ref 70–99)
Glucose-Capillary: 122 mg/dL — ABNORMAL HIGH (ref 70–99)
Glucose-Capillary: 130 mg/dL — ABNORMAL HIGH (ref 70–99)
Glucose-Capillary: 112 mg/dL — ABNORMAL HIGH (ref 70–99)

## 2021-01-21 LAB — CBC WITH DIFFERENTIAL/PLATELET
Abs Immature Granulocytes: 0.19 K/uL — ABNORMAL HIGH (ref 0.00–0.07)
Basophils Absolute: 0 K/uL (ref 0.0–0.1)
Basophils Relative: 0 %
Eosinophils Absolute: 0.1 K/uL (ref 0.0–0.5)
Eosinophils Relative: 0 %
HCT: 24.8 % — ABNORMAL LOW (ref 39.0–52.0)
Hemoglobin: 8 g/dL — ABNORMAL LOW (ref 13.0–17.0)
Immature Granulocytes: 1 %
Lymphocytes Relative: 3 %
Lymphs Abs: 0.7 K/uL (ref 0.7–4.0)
MCH: 31.1 pg (ref 26.0–34.0)
MCHC: 32.3 g/dL (ref 30.0–36.0)
MCV: 96.5 fL (ref 80.0–100.0)
Monocytes Absolute: 1.1 K/uL — ABNORMAL HIGH (ref 0.1–1.0)
Monocytes Relative: 5 %
Neutro Abs: 19.9 K/uL — ABNORMAL HIGH (ref 1.7–7.7)
Neutrophils Relative %: 91 %
Platelets: 391 K/uL (ref 150–400)
RBC: 2.57 MIL/uL — ABNORMAL LOW (ref 4.22–5.81)
RDW: 17.2 % — ABNORMAL HIGH (ref 11.5–15.5)
WBC: 22 K/uL — ABNORMAL HIGH (ref 4.0–10.5)
nRBC: 0 % (ref 0.0–0.2)

## 2021-01-21 LAB — BASIC METABOLIC PANEL
Anion gap: 11 (ref 5–15)
BUN: 85 mg/dL — ABNORMAL HIGH (ref 8–23)
CO2: 25 mmol/L (ref 22–32)
Calcium: 8.2 mg/dL — ABNORMAL LOW (ref 8.9–10.3)
Chloride: 106 mmol/L (ref 98–111)
Creatinine, Ser: 2.96 mg/dL — ABNORMAL HIGH (ref 0.61–1.24)
GFR, Estimated: 22 mL/min — ABNORMAL LOW (ref >60)
Glucose, Bld: 159 mg/dL — ABNORMAL HIGH (ref 70–99)
Potassium: 3.8 mmol/L (ref 3.5–5.1)
Sodium: 142 mmol/L (ref 135–145)

## 2021-01-21 LAB — COOXEMETRY PANEL
Carboxyhemoglobin: 1.3 % (ref 0.5–1.5)
Methemoglobin: 1.1 % (ref 0.0–1.5)
O2 Saturation: 64 %
Total hemoglobin: 8.3 g/dL — ABNORMAL LOW (ref 12.0–16.0)

## 2021-01-21 LAB — MAGNESIUM: Magnesium: 2.1 mg/dL (ref 1.7–2.4)

## 2021-01-21 LAB — HEPATITIS B SURFACE ANTIGEN: Hepatitis B Surface Ag: NONREACTIVE

## 2021-01-21 LAB — HEPATITIS B E ANTIBODY: Hep B E Ab: POSITIVE — AB

## 2021-01-21 LAB — TRIGLYCERIDES: Triglycerides: 70 mg/dL

## 2021-01-21 NOTE — Progress Notes (Signed)
[facility] PROGRESS NOTE  Assessment/ Plan: Pt is a 70 y.o. yo male with history of HTN, HLD, CAD presented with chest pain found to have STEMI.  He underwent left heart cath, placement of Impella device, had V. fib arrest, mechanical ventilation for respiratory failure and AKI.  #Acute kidney injury due to cardiogenic/hemorrhagic shock.  UA bland and kidney ultrasound unremarkable.  Required CRRT from 4/22-4/24.  Patient is nonoliguric and volume status acceptable.  Received intermittent hemodialysis on 4/30 for clearance.  No indications for HD today, reassess tomorrow, possibly will require additional treatments depending on GOC.  Follow closely for GFR recovery.  L IJ Temp Trialaysis Cath present   #Hypernatremia due to increase free water excretion and recovering AKI.  Stable today.   #CAD/acute STEMI required emergent cath with stent placement.  EF 20 to 25%.  Cardiology following.  #V. fib arrest/cardiogenic shock, acute systolic CHF: Arrest in the setting of acute MI.  Impella was removed.  Now off of pressors or inotropes.  Volume status is acceptable.  #Ventilator dependent respiratory failure: Initially extubated and transferred to floor.  Now with hemoptysis and needed reintubation.  Management per pulmonary team.  #Anemia due to acute blood loss: Required multiple blood transfusion.  Transfuse as needed.  #Hypokalemia: Potassium level improved.  #Encephalopathy: Concerned about anoxic brain injury as he does not have much improvement.    Subjective:   NAEON, no pressors  Awake, head gestures to basic questions, nothing more intentional noted by RN  Great uOP 1.8L, SCR 2.3 to 2.96, K and HCO3 ok  30% FIO2  Palliative and AHF notes reviewed, FC at this time  Objective Vital signs in last 24 hours: Vitals:   01/21/21 0500 01/21/21 0600 01/21/21 0754 01/21/21 0815  BP: (!) 149/65 (!) 145/64    Pulse: 92 94  100  Resp: 20 20    Temp:   98  F (36.7 C)   TempSrc:   Oral   SpO2: 100% 100%  100%  Weight:      Height:       Weight change:   Intake/Output Summary (Last 24 hours) at 01/21/2021 0846 Last data filed at 01/21/2021 0835 Gross per 24 hour  Intake 1378.76 ml  Output 2390 ml  Net -1011.24 ml       Labs: Basic Metabolic Panel: Recent Labs  Lab 01/14/21 1702 01/15/21 0333 01/15/21 0334 01/19/21 0500 01/20/21 0122 01/20/21 0140 01/21/21 0346  NA 140 139   < > 156* 141 140 142  K 3.6 3.3*   < > 4.0 4.7 4.5 3.8  CL 99 101   < > 119* 103  --  106  CO2 24 24   < > 25 26  --  25  GLUCOSE 169* 156*   < > 154* 119*  --  159*  BUN 90* 105*   < > 112* 62*  --  85*  CREATININE 3.26* 3.50*   < > 3.21* 2.46*  --  2.96*  CALCIUM 7.9* 8.1*   < > 8.6* 8.1*  --  8.2*  PHOS 5.1* 6.4*  --   --  6.2*  --   --    < > = values in this interval not displayed.   Liver Function Tests: Recent Labs  Lab 01/14/21 1702 01/15/21 0333 01/20/21 0421  AST  --   --  56*  ALT  --   --  104*  ALKPHOS  --   --  109  BILITOT  --   --  0.7  PROT  --   --  6.1*  ALBUMIN 2.4* 2.3* 2.4*   No results for input(s): LIPASE, AMYLASE in the last 168 hours. Recent Labs  Lab 01/20/21 0518  AMMONIA 27   CBC: Recent Labs  Lab 01/16/21 0448 01/17/21 0417 01/19/21 0626 01/20/21 0122 01/20/21 0140 01/21/21 0346  WBC 14.4* 11.7* 12.7* 24.2*  --  22.0*  NEUTROABS  --   --   --   --   --  19.9*  HGB 9.0* 8.3* 8.8* 8.2* 7.8* 8.0*  HCT 28.2* 26.1* 27.5* 24.7* 23.0* 24.8*  MCV 92.8 93.2 97.2 99.2  --  96.5  PLT 386 394 478* 451*  --  391   Cardiac Enzymes: No results for input(s): CKTOTAL, CKMB, CKMBINDEX, TROPONINI in the last 168 hours. CBG: Recent Labs  Lab 01/20/21 1513 01/20/21 1921 01/20/21 2333 01/21/21 0314 01/21/21 0751  GLUCAP 123* 130* 127* 125* 132*    Iron Studies:  No results for input(s): IRON, TIBC, TRANSFERRIN, FERRITIN in the last 72 hours. Studies/Results: Portable Chest x-ray  Result Date:  01/20/2021 CLINICAL DATA:  Initial evaluation for endotracheal tube placement. EXAM: PORTABLE CHEST 1 VIEW COMPARISON:  Prior radiograph from 01/19/2021. FINDINGS: There has been interval placement of an endotracheal tube, tip positioned 3.9 cm above the carina. Feeding tube courses into the abdomen. Left-sided central venous catheter stable. Cardiac and mediastinal silhouettes are stable, and remains within normal limits. Aortic atherosclerosis. Lungs normally inflated and remain clear. No focal infiltrates. No edema or effusion. No pneumothorax. Osseous structures are unchanged. IMPRESSION: 1. Interval placement of endotracheal tube with tip well positioned 3.9 cm above the carina. 2. Otherwise stable appearance of the chest with no active cardiopulmonary disease identified. Electronically Signed   By: Jeannine Boga M.D.   On: 01/20/2021 02:25   DG Chest Port 1 View  Result Date: 01/19/2021 CLINICAL DATA:  Rhonchi EXAM: PORTABLE CHEST 1 VIEW COMPARISON:  01/13/2021 FINDINGS: Single frontal view of the chest demonstrates interval removal of the endotracheal tube and right internal jugular Cordis. Enteric catheter passes below diaphragm tip excluded by collimation. Left internal jugular catheter tip projects over superior vena cava. The cardiac silhouette is unremarkable. No acute airspace disease, effusion, or pneumothorax. No acute bony abnormalities. IMPRESSION: 1. Support devices as above. 2. Interval resolution of bibasilar consolidation. No acute intrathoracic process. Electronically Signed   By: Randa Ngo M.D.   On: 01/19/2021 20:45    Medications: Infusions: . feeding supplement (OSMOLITE 1.5 CAL) 1,000 mL (01/20/21 2201)    Scheduled Medications: . aspirin  81 mg Per Tube Daily  . chlorhexidine gluconate (MEDLINE KIT)  15 mL Mouth Rinse BID  . Chlorhexidine Gluconate Cloth  6 each Topical Daily  . Chlorhexidine Gluconate Cloth  6 each Topical Q0600  . docusate  100 mg Per Tube  BID  . feeding supplement (PROSource TF)  90 mL Per Tube BID  . fiber  1 packet Per Tube BID  . free water  300 mL Per Tube Q6H  . insulin aspart  0-9 Units Subcutaneous Q4H  . mouth rinse  15 mL Mouth Rinse 10 times per day  . modafinil  100 mg Per Tube Daily  . pantoprazole sodium  40 mg Per Tube Daily  . polyethylene glycol  17 g Per Tube BID  . rosuvastatin  10 mg Per Tube Daily  . ticagrelor  90 mg Per Tube BID    have reviewed scheduled  and prn medications.  Physical Exam: General: Critically ill looking male , intubated, eyes open, tracks but doesn't FC Heart:RRR, s1s2 nl, no rubs Lungs: Coarse breath sound bilateral Abdomen:soft,  non-distended Extremities:No edema Dialysis Access: L IJ temporary HD catheter  Ryan B Sanford 01/21/2021,8:46 AM  LOS: 18 days

## 2021-01-21 NOTE — Progress Notes (Addendum)
Palliative Medicine Inpatient Follow Up Note  Reason for consult: Discuss goals of care  HPI:  Per intake H&P --> [name] has been admitted to [facility] for the past 16 days.  He has had a very complicated hospitalization inclusive of an intensive care unit stay for cardiogenic shock he also went into V. fib arrest and required multiple CPR episodes.  He has been medically optimized and is now on the cardiac unit though there is concern regarding his his MRI scan for anoxic brain injury.  Palliative care has been asked to get involved in the setting of suspected anoxic brain injury to further discuss goals of care with patient's power of attorney, Merry Proud.  Today's Discussion (01/21/2021):  *Please note that this is a verbal dictation therefore any spelling or grammatical errors are due to the "[place] [employer]" system interpretation.  Chart reviewed this morning.  Met at bedside with [name] this afternoon.  He is alert though his level of orientation is questionable. I requested for him to do a couple basic tasks and he was not able to follow the simple commands.  Dr. Haroldine Laws had a detailed conversation with patient's HCPOA, Merry Proud this morning.   Spoke to [name] this afternoon and confirmed that perhaps [name] is not as alert and oriented as there was hope he would be.  I shared with Merry Proud that my understanding through speaking with Dr. Haroldine Laws was ideally to extubate [name] and see if he could participate in rehabilitation with the plan to not reintubate.  Merry Proud shares with me that again if [name] could not live a functional life it would not be consistent with what he would wish for which is why he completed a living will.  Merry Proud shares that there is a lot to think about in the oncoming days.  I offered the palliative care phone number if he would like to review any of his thoughts or feelings regarding [name] medical condition at this time.  Is a present  continue with medical efforts to improve [name] health condition.   Questions and concerns addressed   Objective Assessment: Vital Signs Vitals:   01/21/21 1200 01/21/21 1511  BP: (!) 153/75   Pulse: (!) 102   Resp: 19   Temp:  (!) 100.8 F (38.2 C)  SpO2: 98%     Intake/Output Summary (Last 24 hours) at 01/21/2021 1543 Last data filed at 01/21/2021 1524 Gross per 24 hour  Intake 1378.76 ml  Output 2450 ml  Net -1071.24 ml   Last Weight  Most recent update: 01/19/2021 10:34 AM   Weight  85 kg (187 lb 6.3 oz)           Gen: Elderly ill-appearing male HEENT: ETT, Core-track,, dry mucous membranes CV: Regular rate and rhythm PULM:  On ventilator ABD: soft/nontender/nondistended  EXT: No edema  Neuro: Opens eyes   SUMMARY OF RECOMMENDATIONS Full Code  Advance directives will be scanned into [employer]  Continue to provide full scope of treatment to identify if improvements can be made - Watchful waiting  Ongoing palliative care support  Time Spent: 25 Greater than 50% of the time was spent in counseling and coordination of care ______________________________________________________________________________________ [facility] Team Team Cell Phone: [phone] Please utilize secure chat with additional questions, if there is no response within 30 minutes please call the above phone number  Palliative Medicine Team providers are available by phone from 7am to 7pm daily and can be reached through  the team cell phone.  Should this patient require assistance outside of these hours, please call the patient's attending physician.

## 2021-01-21 NOTE — Progress Notes (Signed)
Nutrition Follow-up  DOCUMENTATION CODES:   Not applicable  INTERVENTION:   Continue TF via Cortrak: Osmolite 1.5 at 60 ml/h (1440 ml per day) Prosource 90 ml BID  Provides 2320 kcal, 134 gm protein, 1094 ml free water daily.  NUTRITION DIAGNOSIS:   Inadequate oral intake related to acute illness as evidenced by NPO status.  Ongoing   GOAL:   Patient will meet greater than or equal to 90% of their needs  Met with TF  MONITOR:   TF tolerance  REASON FOR ASSESSMENT:   Ventilator    ASSESSMENT:   70 yo male admitted with STEMI with cardiogenic shock, cardiac arrest and underwent PCI with stent to RCA, Impella placement  Discussed patient in ICU rounds and with RN today. Patient developed recurrent respiratory failure d/t secretions, had significant hemoptysis and required emergent reintubation 5/1.  Last HD 4/30. Nephrology following for HD needs. No indications for HD today.  Tolerating TF via Cortrak tube. Receiving Osmolite 1.5 at 60 ml/h with Prosource TF 90 ml BID to provide 2320 kcal, 134 gm protein, 1094 ml free water daily. Free water flushes 300 ml every 6 hours for a total of 2294 ml free water daily.  Patient is currently intubated on ventilator support MV: 15.8 L/min Temp (24hrs), Avg:99.2 F (37.3 C), Min:98 F (36.7 C), Max:100.6 F (38.1 C)   Labs reviewed.  CBG: [id number]  Medications reviewed and include Colace, Nutrisource Fiber, Novolog SSI, Miralax.   Diet Order:   Diet Order            Diet NPO time specified  Diet effective now                 EDUCATION NEEDS:   Not appropriate for education at this time  Skin:  Skin Assessment: Reviewed RN Assessment  Last BM:  225 ml via rectal tube  Height:   Ht Readings from Last 1 Encounters:  01/13/21 6' 1" (1.854 m)    Weight:   Wt Readings from Last 1 Encounters:  01/19/21 85 kg    BMI:  Body mass index is 24.72 kg/m.  Estimated Nutritional Needs:   Kcal:   2250  Protein:  125-140 grams  Fluid:  >2 L/day    Lucas Mallow, RD, LDN, CNSC Please refer to Amion for contact information.

## 2021-01-21 NOTE — Progress Notes (Signed)
Patient ID: [name], male   DOB: 1951-04-12, 70 y.o.   MRN: [id number]     Advanced Heart Failure Rounding Note  PCP-Cardiologist: No primary care provider on file.   Subjective:    4/20 Impella removed. MRI brain with numerous micro embolic infarctions. Off pressors.  4/21 lasix drip stopped.  4/22 started CVVH 4/24 CVVH stopped. Failed extubation. Re-intubated.  4/26 Extubated  5/1 Re-intubated  Remains on vent at 30%. Off pressors  Awake on vent. Will track voice and nod but not follow commands reliably.No change overnight. HCPOA met with Palliatvie yesterday and plan for full code with trach this week.  Scr 2.46 -> 2.96 Good urine output off lasix. Sodium 142 Weight stable. Co-ox 64%   Objective:   Weight Range: 85 kg Body mass index is 24.72 kg/m.   Vital Signs:   Temp:  [98 F (36.7 C)-100.6 F (38.1 C)] 98 F (36.7 C) (05/02 0754) Pulse Rate:  [84-99] 94 (05/02 0600) Resp:  [15-23] 20 (05/02 0600) BP: (116-149)/(50-70) 145/64 (05/02 0600) SpO2:  [94 %-100 %] 100 % (05/02 0600) FiO2 (%):  [30 %-40 %] 30 % (05/02 0400) Last BM Date: 01/21/21  Weight change: Filed Weights   01/19/21 0346 01/19/21 0635 01/19/21 1014  Weight: 86.9 kg 85 kg 85 kg    Intake/Output:   Intake/Output Summary (Last 24 hours) at 01/21/2021 0756 Last data filed at 01/21/2021 0600 Gross per 24 hour  Intake 1438.76 ml  Output 2115 ml  Net -676.24 ml      Physical Exam   General: Sitting up in bed nonverbal. Will track but not follow commands HEENT: normal + ETT + Cor track Neck: supple. no JVD. LIJ HD cath Carotids 2+ bilat; no bruits. No lymphadenopathy or thryomegaly appreciated. Cor: PMI nondisplaced. Regular rate & rhythm. No rubs, gallops or murmurs. Lungs: coarse Abdomen: soft, nontender, nondistended. No hepatosplenomegaly. No bruits or masses. Good bowel sounds. Extremities: no cyanosis, clubbing, rash, edema Neuro: as above   Telemetry   Sinus 80-90s  Personally reviewed  Labs    CBC Recent Labs    01/20/21 0122 01/20/21 0140 01/21/21 0346  WBC 24.2*  --  22.0*  NEUTROABS  --   --  19.9*  HGB 8.2* 7.8* 8.0*  HCT 24.7* 23.0* 24.8*  MCV 99.2  --  96.5  PLT 451*  --  758   Basic Metabolic Panel Recent Labs    01/20/21 0122 01/20/21 0140 01/21/21 0346  NA 141 140 142  K 4.7 4.5 3.8  CL 103  --  106  CO2 26  --  25  GLUCOSE 119*  --  159*  BUN 62*  --  85*  CREATININE 2.46*  --  2.96*  CALCIUM 8.1*  --  8.2*  MG 1.9  --  2.1  PHOS 6.2*  --   --    Liver Function Tests Recent Labs    01/20/21 0421  AST 56*  ALT 104*  ALKPHOS 109  BILITOT 0.7  PROT 6.1*  ALBUMIN 2.4*   No results for input(s): LIPASE, AMYLASE in the last 72 hours. Cardiac Enzymes No results for input(s): CKTOTAL, CKMB, CKMBINDEX, TROPONINI in the last 72 hours.  BNP: BNP (last 3 results) No results for input(s): BNP in the last 8760 hours.  ProBNP (last 3 results) No results for input(s): PROBNP in the last 8760 hours.   D-Dimer No results for input(s): DDIMER in the last 72 hours. Hemoglobin A1C No results for input(s): HGBA1C  in the last 72 hours. Fasting Lipid Panel Recent Labs    01/21/21 0346  TRIG 70   Thyroid Function Tests No results for input(s): TSH, T4TOTAL, T3FREE, THYROIDAB in the last 72 hours.  Invalid input(s): FREET3  Other results:   Imaging    No results found.   Medications:     Scheduled Medications: . aspirin  81 mg Per Tube Daily  . chlorhexidine gluconate (MEDLINE KIT)  15 mL Mouth Rinse BID  . Chlorhexidine Gluconate Cloth  6 each Topical Daily  . Chlorhexidine Gluconate Cloth  6 each Topical Q0600  . docusate  100 mg Per Tube BID  . feeding supplement (PROSource TF)  90 mL Per Tube BID  . fiber  1 packet Per Tube BID  . free water  300 mL Per Tube Q6H  . insulin aspart  0-9 Units Subcutaneous Q4H  . mouth rinse  15 mL Mouth Rinse 10 times per day  . modafinil  100 mg Per Tube Daily   . pantoprazole sodium  40 mg Per Tube Daily  . polyethylene glycol  17 g Per Tube BID  . rosuvastatin  10 mg Per Tube Daily  . ticagrelor  90 mg Per Tube BID    Infusions: . feeding supplement (OSMOLITE 1.5 CAL) 1,000 mL (01/20/21 2201)    PRN Medications: sodium chloride, acetaminophen (TYLENOL) oral liquid 160 mg/5 mL, atropine, fentaNYL (SUBLIMAZE) injection, fentaNYL (SUBLIMAZE) injection, ipratropium-albuterol, ondansetron (ZOFRAN) IV, sodium chloride flush    Assessment/Plan   1. CAD/ Acute Inferior STEMI - emergent cath w/ pRCA occlusion s/p PCI + DES - Initial angiography showed sluggish flow in LAD and LCX>>improved post impella placement  - Echo LVEF 20-25%, RV moderately reduced - Continue DAPT/statin - no s/s angina  2. VF Arrest - in setting of acute MI, now s/p revascularization  - rhythm stable.  - appears to have significant anoxic brain injury. See below  3. Shock, Combination Cardiogenic + Hemorrhagic  - Acute MI + developed severe hemorrhagic shock due to bleeding at left groin Impella Transfused 4U PRBCs . - Impella out 4/20  - resolveds  4. Acute Systolic Heart Failure>>Cardiogenic Shock  - ICM Echo LVEF 20-25%, RV moderately reduced - Impella out 4/20. Off all pressors. Co-ox 62% - CVVH stopped 4/24.  - No bb with shock.  - Off hydralazine and imdur with low BP. Will restart.   5. Acute hypoxic respiratory failure - 4/24 failed extubation trial.  -5/1 reintubated due to inability to handle secretions - plan trach this week   5. Nonoliguric AKI - due to ATN/shock - CVVH begun 4/22 and stopped 4/24. Had one session of iHD on 4/30 for clearance - SCr rising again. Making good urine. Off lasix - Now watchful waiting. Not candidate for outpatient HD  6. Anoxic brain injury  - EEG w/ generalized periodic discharges with triphasic morphology + moderate to severe diffuse encephalopathy.  - MRI- numerous micro embolic infarctions . No hemorrhage or  mass.  - CIR has turned down due to poor rehab potential. Recommending SNF - He appears to have significant anoxic injury. Will track voice and nod at time but does not seem to follow commands. Also aphasic - HCPOA has met with Palliative Care team plan for Full Code for now with trach later this week - I think potential for meaning full recovery is quite low. I will discuss with them again today.   7. Acute blood loss anemia  -Received 1u on 4/25.   -  Hgb stable - Follow daily CBC. Hgb 8.0 today - keep Hgb >= 7.5  8. Hypernatremia - FW per Nephrology  CRITICAL CARE Performed by: Glori Bickers  Total critical care time: 35 minutes  Critical care time was exclusive of separately billable procedures and treating other patients.  Critical care was necessary to treat or prevent imminent or life-threatening deterioration.  Critical care was time spent personally by me (independent of midlevel providers or residents) on the following activities: development of treatment plan with patient and/or surrogate as well as nursing, discussions with consultants, evaluation of patient's response to treatment, examination of patient, obtaining history from patient or surrogate, ordering and performing treatments and interventions, ordering and review of laboratory studies, ordering and review of radiographic studies, pulse oximetry and re-evaluation of patient's condition.     Length of Stay: 69  Glori Bickers, MD  7:56 AM

## 2021-01-21 NOTE — Progress Notes (Signed)
NAME:  [name], MRN:  [id number], DOB:  04-29-1951, LOS: 40 ADMISSION DATE:  01/03/2021, CONSULTATION DATE:  01/03/2021 REFERRING MD:  Dr. Haroldine Laws, CHIEF COMPLAINT:  STEMI/ cardiogenic shock  History of Present Illness:   70 year old male with PMHx HTN, HLD, CAD admitted as a STEMI who suffered cardiac arrest in hospital prior to emergent cardiac cath. PCI with stent to proximal RCA. EF 25-30%. Prolonged ICU stay for cardiogenic/hemorrhagic shock requiring vasopressors and transfusions. Mental status did not greatly improve as his hemodynamics did. MRI showed scattered punctate infarctions concerning for anoxic injury. AKI progressed to require CRRT, he has since been transitioned to [facility]. Extubated 4/26. Did not pass swallow due to aspiration risk. 4/30 he developed worsening hypoxia and developed hemoptysis with multiple large clots being expectorated.   Pertinent  Medical History  HTN, HLD, CAD  Significant Hospital Events: Including procedures, antibiotic start and stop dates in addition to other pertinent events   . 4/14 Admitted Cards with inferior STEMI-> bradycardia s/p TVP, cardiogenic shock and cardiac arrest s/p impella with PCI/stent to RCA, complicated by bleeding s/p 4 units PRBC thought 2/2 cangrelor, pending coags.  Has R IJ PA cath, TVP R groin, impella L groin . 4/15 Remains on impella, weaning pressors and inotropes. Started diuresis due to significantly increased oxygen requirements. . 4/18 Concern for seizure-like activity, Neuro consult, LTM/EEG, CT Head NAICA/no evidence of edema. Transitioned to ceftriaxone from cefepime. . 4/19 Remains on Impella, off pressors. Continuing diuresis with hopeful discontinuation of Impella, allowing for MRI 4/20-4/21. Continuing LTM EEG, given facial twitching/unclear neurologic status. . 4/20 >8L UOP/24H with Lasix gtt, Impella at P6, MRI Brain pending Impella removal . 4/24 CRRT stopped, reintubated . 4/25 - weaned all day. Good  response to diuretics . 5/1 transferred back to ICU for hemoptysis, hypoxic resp failure. Intubated emergently.   Interim History / Subjective:  5/2: tmax 100.6 overnight. 1749ml uop over 24h. coox 64 off inotropes. Cr/BUN worsening   Objective   Blood pressure (!) 145/64, pulse 94, temperature 98 F (36.7 C), temperature source Oral, resp. rate 20, height 6\' 1"  (1.854 m), weight 85 kg, SpO2 100 %. CVP:  [2 mmHg] 2 mmHg  Vent Mode: PRVC FiO2 (%):  [30 %-40 %] 30 % Set Rate:  [18 bmp] 18 bmp Vt Set:  [550 mL] 550 mL PEEP:  [5 cmH20] 5 cmH20 Pressure Support:  [8 cmH20] 8 cmH20   Intake/Output Summary (Last 24 hours) at 01/21/2021 0813 Last data filed at 01/21/2021 0600 Gross per 24 hour  Intake 1378.76 ml  Output 2115 ml  Net -736.24 ml   Filed Weights   01/19/21 0346 01/19/21 0635 01/19/21 1014  Weight: 86.9 kg 85 kg 85 kg    Physical Examination: General:  Elderly appearing male in NAD with ett in place Neuro: alert, not-oriented, nods but inappropriately and not following commands.  HEENT:  [place]/AT, No JVD noted, PERRL Cardiovascular:  RRR, no MRG Lungs:  Coarse throughout Abdomen:  Soft, non-distended Musculoskeletal:  No acute deformity Skin:  Intact, MMM   Labs/imaging that I have personally reviewed: (right click and "Reselect all SmartList Selections" daily)   BUN/Cr 85/2.9  Resolved Hospital Problem list   Hemorrhagic shock Lactic acidosis  Assessment & Plan:   Acute hypoxemic respiratory failure  Aspiration of blood into airway. Epistaxis vs upper airway bleed.  Poor airway protection - will need trach - on cpap - VAP bundle - Zosyn stopped 4/25 - Propofol and PRN fentanyl for RASS goal -  1 to -2.   Acute inferior STEMI post PCI and stenting RCA, present on admission Cardiogenic shock, present on admission now resolved. Ventricular fibrillation, cardiac arrest Acute systolic heart failure, present on admission now resolved. - Holding isosorbide,  hydralazine while hypotensive.  - Keep dual antiplatelet therapy aspirin plus Brilinta as unclear that this is true hemoptysis.  - Continue crestor per tube - Cardiology following  AKI - appears euvolemic at this time Hypernatremia - Nephrology following - Intermittent HD. Does not appear to be candidate for outpt dialysis so if indices cont to worsen family needs to further consider palliation.  - Free water flushes increased  Anoxic brain injury- having some improvement on exam so giving more time Acute bilateral strokes, multiple microembolic infarctions on MRI Initial EEG negative, Twitching- no EEG correlate so thought less likely seizures - Supportive care - provigil Debilitation:  -profound weakness.  -will need extensive PT/OT  GOC - palliative following. Meeting later today.    Best practice (right click and "Reselect all SmartList Selections" daily)  Diet:  Tube Feed  Pain/Anxiety/Delirium protocol (if indicated): Yes (RASS goal -1,-2)  VAP protocol (if indicated): Yes DVT prophylaxis: Contraindicated GI prophylaxis: PPI will add fiber for diarrhea. Glucose control:  SSI No and Basal insulin No Control is adequate.  Central venous access:  Yes, and it is still needed  Arterial line:  N/A Foley:  N/A  Mobility:  bed rest PT consulted: N/A Last date of multidisciplinary goals of care discussion daily with POA 4/30 with palliative Code Status:  full code Disposition: ICU   Critical care time: The patient is critically ill with multiple organ systems failure and requires high complexity decision making for assessment and support, frequent evaluation and titration of therapies, application of advanced monitoring technologies and extensive interpretation of multiple databases.  Critical care time 38 mins. This represents my time independent of the NPs time taking care of the pt. This is excluding procedures.    Audria Nine DO [facility] 01/21/2021, 8:13 AM See Amion for pager If no response to pager, please call [phone] until 1900 After 1900 please call [facility] [phone]

## 2021-01-21 NOTE — Progress Notes (Signed)
SLP Cancellation Note  Patient Details Name: [name] MRN: [id number] DOB: January 18, 1951   Cancelled treatment:       Reason Eval/Treat Not Completed: Patient not medically ready. Pt intubated. Will sign off at this time; please reorder when appropriate.    Nasya Vincent, Katherene Ponto 01/21/2021, 9:47 AM

## 2021-01-21 NOTE — Progress Notes (Signed)
Physical Therapy Treatment Patient Details Name: [name] MRN: [id number] DOB: 26-Mar-1951 Today's Date: 01/21/2021    History of Present Illness Pt is 70 yo admitted 4/14 with chest pain and STEMI with bradycardia and hypotension. Pt taken for urgent cath with temporary pacer placed but pt decompensated to cardiac arrest with intubation and CPR. Stent placed and pt with further complication of cardiogenic shock with Impella placed. 4/18 seizure like activity, Impella removed 4/20, 4/20 MRI with scattered micro embolic infarctions. Extubated 4/24 and reintubated 2 hrs later. Extubated 4/26. Reintubated 01/20/21. PMHx: HTN, HLD, CAD    PT Comments    Pt admitted with above diagnosis. Pt bed in Egress position for up to 15 min with PT attempting to have pt lean forward and activate trunk as well as perform neck rotation and lateral flexion. Also trying to have pt exercise UE and LEs.   Pt limited by suspected global aphasia and not following commands.  Pt allowed some passive stretching to neck, UE and LEs but not very active in therapy. Left pt in Egress position and was able to achieve midline position of neck and used pillow to maintain this position. Pt currently with functional limitations due to balance and endurance deficits. Pt will benefit from skilled PT to increase their independence and safety with mobility to allow discharge to the venue listed below.     Follow Up Recommendations  Supervision/Assistance - 24 hour;SNF     Equipment Recommendations  Wheelchair cushion (measurements PT);3in1 (PT);Wheelchair (measurements PT);Hospital bed    Recommendations for Other Services       Precautions / Restrictions Precautions Precautions: Fall Precaution Comments: globally aphasic, cortrak, flexiseal, vent Restrictions Weight Bearing Restrictions: No    Mobility  Bed Mobility Overal bed mobility: Needs Assistance             General bed mobility comments: +2 total assist  to pull up in bed prior to positioning bed in chair position, +2 total assist to pull trunk forward in chair position    Transfers                 General transfer comment: NT  Ambulation/Gait             General Gait Details: unable   Stairs             Wheelchair Mobility    Modified Rankin (Stroke Patients Only) Modified Rankin (Stroke Patients Only) Pre-Morbid Rankin Score: No symptoms Modified Rankin: Severe disability     Balance Overall balance assessment: Needs assistance Sitting-balance support: Feet supported;No upper extremity supported Sitting balance-Leahy Scale: Zero Sitting balance - Comments: physical assist max to total  for trunk control in sitting to lean forward in Egress position trying to get pt to use UEs but pt not actively gripping the rails of the bed.  Sat up for periods of 20 second intervals with assist as above x 4. Tried to get pt to perform UE and LE exercise but only moved UE and LEs if tickled or if startled.                                    Cognition Arousal/Alertness: Awake/alert Behavior During Therapy: Flat affect Overall Cognitive Status: Difficult to assess  General Comments: no following commands with multimodal cues      Exercises General Exercises - Lower Extremity Ankle Circles/Pumps: Both;5 reps;Supine;PROM Heel Slides: AAROM;Both;5 reps;Supine;PROM Other Exercises Other Exercises: gentle rotation and lateral flexion PROM of neck, also rotation bil directions of trunk.    General Comments General comments (skin integrity, edema, etc.): VSS with pt on Vent 30% FiO2 with PEEP 5      Pertinent Vitals/Pain Pain Assessment: Faces Faces Pain Scale: Hurts even more Pain Location: neck, R LE with knee extension at end range Pain Descriptors / Indicators: Grimacing Pain Intervention(s): Limited activity within patient's tolerance;Monitored during  session;Repositioned    Home Living                      Prior Function            PT Goals (current goals can now be found in the care plan section) Acute Rehab PT Goals Patient Stated Goal: unable Progress towards PT goals: Not progressing toward goals - comment (global aphasia, lack of following commands)    Frequency    Min 3X/week      PT Plan Current plan remains appropriate;Frequency needs to be updated    Co-evaluation PT/OT/SLP Co-Evaluation/Treatment: Yes Reason for Co-Treatment: Complexity of the patient's impairments (multi-system involvement);For patient/therapist safety PT goals addressed during session: Mobility/safety with mobility        AM-PAC PT "6 Clicks" Mobility   Outcome Measure  Help needed turning from your back to your side while in a flat bed without using bedrails?: Total Help needed moving from lying on your back to sitting on the side of a flat bed without using bedrails?: Total Help needed moving to and from a bed to a chair (including a wheelchair)?: Total Help needed standing up from a chair using your arms (e.g., wheelchair or bedside chair)?: Total Help needed to walk in hospital room?: Total Help needed climbing 3-5 steps with a railing? : Total 6 Click Score: 6    End of Session Equipment Utilized During Treatment:  (Vent) Activity Tolerance: Patient limited by fatigue Patient left: with call bell/phone within reach;in bed;with bed alarm set;with restraints reapplied;with SCD's reapplied (in chair position) Nurse Communication: Mobility status;Need for lift equipment PT Visit Diagnosis: Unsteadiness on feet (R26.81);Muscle weakness (generalized) (M62.81);Hemiplegia and hemiparesis Hemiplegia - Right/Left: Right Hemiplegia - dominant/non-dominant: Non-dominant Hemiplegia - caused by: Cerebral infarction     Time: 7408-1448 PT Time Calculation (min) (ACUTE ONLY): 29 min  Charges:  $Therapeutic Activity: 8-22  mins                     Pahoua Schreiner M,PT Acute Rehab Services [phone] (pager)   Alvira Philips 01/21/2021, 3:27 PM

## 2021-01-21 NOTE — Progress Notes (Signed)
Occupational Therapy Treatment Patient Details Name: [name] MRN: [id number] DOB: 10-23-1950 Today's Date: 01/21/2021    History of present illness Pt is 70 yo admitted 4/14 with chest pain and STEMI with bradycardia and hypotension. Pt taken for urgent cath with temporary pacer placed but pt decompensated to cardiac arrest with intubation and CPR. Stent placed and pt with further complication of cardiogenic shock with Impella placed. 4/18 seizure like activity, Impella removed 4/20, 4/20 MRI with scattered micro embolic infarctions. Extubated 4/24 and reintubated 2 hrs later. Extubated 4/26. Reintubated 01/20/21. PMHx: HTN, HLD, CAD   OT comments  Pt reintubated over the weekend. Positioned bed in chair position and focused on gentle neck ROM, pulling trunk forward and attempting to elicit active movement in UE and LEs. Pt nodding head inconsistently in response to yes/no questions. Not following commands. Updated recommendation to [facility].  Follow Up Recommendations  [facility] bed;Wheelchair cushion (measurements OT);Wheelchair (measurements OT) (lift equipment)    Recommendations for Other Services      Precautions / Restrictions Precautions Precautions: Fall Precaution Comments: globally aphasic, cortrak, flexiseal, vent       Mobility Bed Mobility Overal bed mobility: Needs Assistance             General bed mobility comments: +2 total assist to pull up in bed prior to positioning bed in chair position, +2 total assist to pull trunk forward in chair position    Transfers                      Balance Overall balance assessment: Needs assistance                                         ADL either performed or assessed with clinical judgement   ADL Overall ADL's : Needs assistance/impaired         Upper Body Bathing: Total assistance;Bed level (bed in chair position)                                    Vision       Perception     Praxis      Cognition Arousal/Alertness: Awake/alert Behavior During Therapy: Flat affect Overall Cognitive Status: Difficult to assess                                 General Comments: no following commands with multimodal cues        Exercises Other Exercises Other Exercises: gentle rotation and lateral flexion PROM toward L   Shoulder Instructions       General Comments      Pertinent Vitals/ Pain       Pain Assessment: Faces Faces Pain Scale: Hurts even more Pain Location: neck, R LE with knee extension at end range Pain Descriptors / Indicators: Grimacing Pain Intervention(s): Monitored during session;Repositioned  Home Living                                          Prior Functioning/Environment              Frequency  Min 2X/week  Progress Toward Goals  OT Goals(current goals can now be found in the care plan section)  Progress towards OT goals: Not progressing toward goals - comment  Acute Rehab OT Goals Patient Stated Goal: unable OT Goal Formulation: Patient unable to participate in goal setting Time For Goal Achievement: 01/30/21 Potential to Achieve Goals: Poor  Plan Discharge plan needs to be updated    Co-evaluation    PT/OT/SLP Co-Evaluation/Treatment: Yes Reason for Co-Treatment: Complexity of the patient's impairments (multi-system involvement)   OT goals addressed during session: Strengthening/ROM      AM-PAC OT "6 Clicks" Daily Activity     Outcome Measure   Help from another person eating meals?: Total Help from another person taking care of personal grooming?: Total Help from another person toileting, which includes using toliet, bedpan, or urinal?: Total Help from another person bathing (including washing, rinsing, drying)?: Total Help from another person to put on and taking off regular upper body clothing?: Total Help from another  person to put on and taking off regular lower body clothing?: Total 6 Click Score: 6    End of Session    OT Visit Diagnosis: Cognitive communication deficit (R41.841);Muscle weakness (generalized) (M62.81);Other abnormalities of gait and mobility (R26.89) Symptoms and signs involving cognitive functions: Cerebral infarction Hemiplegia - caused by: Cerebral infarction   Activity Tolerance Patient tolerated treatment well   Patient Left in bed;with call bell/phone within reach;with bed alarm set;with nursing/sitter in room;with restraints reapplied   Nurse Communication          Time: 9242-6834 OT Time Calculation (min): 29 min  Charges: OT General Charges $OT Visit: 1 Visit OT Treatments $Therapeutic Activity: 8-22 mins  [name], OTR/L Acute Rehabilitation Services Pager: [phone] Office: [phone]   [name] 01/21/2021, 11:45 AM

## 2021-01-22 DIAGNOSIS — Z978 Presence of other specified devices: Secondary | ICD-10-CM | POA: Diagnosis not present

## 2021-01-22 DIAGNOSIS — J9601 Acute respiratory failure with hypoxia: Secondary | ICD-10-CM | POA: Diagnosis not present

## 2021-01-22 DIAGNOSIS — R570 Cardiogenic shock: Secondary | ICD-10-CM | POA: Diagnosis not present

## 2021-01-22 DIAGNOSIS — R042 Hemoptysis: Secondary | ICD-10-CM | POA: Diagnosis not present

## 2021-01-22 DIAGNOSIS — N170 Acute kidney failure with tubular necrosis: Secondary | ICD-10-CM | POA: Diagnosis not present

## 2021-01-22 LAB — MAGNESIUM: Magnesium: 2.3 mg/dL (ref 1.7–2.4)

## 2021-01-22 LAB — COMPREHENSIVE METABOLIC PANEL
ALT: 70 U/L — ABNORMAL HIGH (ref 0–44)
AST: 36 U/L (ref 15–41)
Albumin: 2.3 g/dL — ABNORMAL LOW (ref 3.5–5.0)
Alkaline Phosphatase: 108 U/L (ref 38–126)
Anion gap: 11 (ref 5–15)
BUN: 94 mg/dL — ABNORMAL HIGH (ref 8–23)
CO2: 24 mmol/L (ref 22–32)
Calcium: 8.2 mg/dL — ABNORMAL LOW (ref 8.9–10.3)
Chloride: 107 mmol/L (ref 98–111)
Creatinine, Ser: 2.99 mg/dL — ABNORMAL HIGH (ref 0.61–1.24)
GFR, Estimated: 22 mL/min — ABNORMAL LOW (ref >60)
Glucose, Bld: 153 mg/dL — ABNORMAL HIGH (ref 70–99)
Potassium: 3.7 mmol/L (ref 3.5–5.1)
Sodium: 142 mmol/L (ref 135–145)
Total Bilirubin: 0.7 mg/dL (ref 0.3–1.2)
Total Protein: 6.3 g/dL — ABNORMAL LOW (ref 6.5–8.1)

## 2021-01-22 LAB — GLUCOSE, CAPILLARY
Glucose-Capillary: 115 mg/dL — ABNORMAL HIGH (ref 70–99)
Glucose-Capillary: 122 mg/dL — ABNORMAL HIGH (ref 70–99)
Glucose-Capillary: 133 mg/dL — ABNORMAL HIGH (ref 70–99)
Glucose-Capillary: 134 mg/dL — ABNORMAL HIGH (ref 70–99)
Glucose-Capillary: 134 mg/dL — ABNORMAL HIGH (ref 70–99)
Glucose-Capillary: 135 mg/dL — ABNORMAL HIGH (ref 70–99)
Glucose-Capillary: 147 mg/dL — ABNORMAL HIGH (ref 70–99)

## 2021-01-22 LAB — COOXEMETRY PANEL
Carboxyhemoglobin: 1.5 % (ref 0.5–1.5)
Methemoglobin: 0.7 % (ref 0.0–1.5)
O2 Saturation: 74.2 %
Total hemoglobin: 8 g/dL — ABNORMAL LOW (ref 12.0–16.0)

## 2021-01-22 MED ORDER — FREE WATER
200.0000 mL | Freq: Four times a day (QID) | Status: DC
  Administered 2021-01-22 – 2021-01-24 (×8): 200 mL

## 2021-01-22 MED ORDER — FUROSEMIDE 10 MG/ML IJ SOLN
80.0000 mg | Freq: Once | INTRAMUSCULAR | Status: AC
  Administered 2021-01-22: 80 mg via INTRAVENOUS

## 2021-01-22 NOTE — Progress Notes (Signed)
[name]  Assessment/ Plan: Pt is a 70 y.o. yo male with history of HTN, HLD, CAD presented with chest pain found to have STEMI.  He underwent left heart cath, placement of Impella device, had V. fib arrest, mechanical ventilation for respiratory failure and AKI.  #Acute kidney injury due to cardiogenic/hemorrhagic shock.  UA bland and kidney ultrasound unremarkable.  Required CRRT from 4/22-4/24.  Patient is nonoliguric and volume status acceptable.  Received intermittent hemodialysis on 4/30 for clearance.  No indications for HD today, reassess tomorrow, and if still ok will arrange tem L IJHD cath removal as early as tomorrow.     #Hypernatremia due to increase free water excretion and recovering AKI.  Stable today.   #CAD/acute STEMI required emergent cath with stent placement.  EF 20 to 25%.  AHF following.  #V. fib arrest/cardiogenic shock, acute systolic CHF: Arrest in the setting of acute MI.  Impella was removed.  Now off of pressors or inotropes.  Volume status is acceptable.  #Ventilator dependent respiratory failure: Initially extubated and transferred to floor.  Minimal FIO2, on PSV.  Management per pulmonary team.  #Anemia due to acute blood loss: Required multiple blood transfusion.  Transfuse as needed.  #Hypokalemia: Potassium level improved.  #Encephalopathy: Anoxic injury probable. Monitor  Subjective:   NAEON, no pressors  GOC discussions reviewed, plan to cont current plans at this time  SCr stable 2.9, K and HCO3 ok, >0.9L UOP past 24h  Awake, head gestures to basic questions,  Has L IJ Temp HD cath present  30% FIO2, on PSV  Objective Vital signs in last 24 hours: Vitals:   01/22/21 0400 01/22/21 0500 01/22/21 0600 01/22/21 0736  BP: (!) 158/71 (!) 150/78 127/60   Pulse: (!) 117 (!) 108 75   Resp: (!) 24 (!) 27 18   Temp:    98.8 F (37.1 C)  TempSrc:    Oral  SpO2: 100% 100% 96%   Weight:      Height:        Weight change:   Intake/Output Summary (Last 24 hours) at 01/22/2021 0942 Last data filed at 01/22/2021 0754 Gross per 24 hour  Intake 1260 ml  Output 1351 ml  Net -91 ml       Labs: Basic Metabolic Panel: Recent Labs  Lab 01/20/21 0122 01/20/21 0140 01/21/21 0346 01/22/21 0337  NA 141 140 142 142  K 4.7 4.5 3.8 3.7  CL 103  --  106 107  CO2 26  --  25 24  GLUCOSE 119*  --  159* 153*  BUN 62*  --  85* 94*  CREATININE 2.46*  --  2.96* 2.99*  CALCIUM 8.1*  --  8.2* 8.2*  PHOS 6.2*  --   --   --    Liver Function Tests: Recent Labs  Lab 01/20/21 0421 01/22/21 0337  AST 56* 36  ALT 104* 70*  ALKPHOS 109 108  BILITOT 0.7 0.7  PROT 6.1* 6.3*  ALBUMIN 2.4* 2.3*   No results for input(s): LIPASE, AMYLASE in the last 168 hours. Recent Labs  Lab 01/20/21 0518  AMMONIA 27   CBC: Recent Labs  Lab 01/16/21 0448 01/17/21 0417 01/19/21 0626 01/20/21 0122 01/20/21 0140 01/21/21 0346  WBC 14.4* 11.7* 12.7* 24.2*  --  22.0*  NEUTROABS  --   --   --   --   --  19.9*  HGB 9.0* 8.3* 8.8* 8.2* 7.8* 8.0*  HCT 28.2* 26.1* 27.5*  24.7* 23.0* 24.8*  MCV 92.8 93.2 97.2 99.2  --  96.5  PLT 386 394 478* 451*  --  391   Cardiac Enzymes: No results for input(s): CKTOTAL, CKMB, CKMBINDEX, TROPONINI in the last 168 hours. CBG: Recent Labs  Lab 01/21/21 1501 01/21/21 1954 01/22/21 0010 01/22/21 0308 01/22/21 0733  GLUCAP 130* 112* 122* 115* 133*    Iron Studies:  No results for input(s): IRON, TIBC, TRANSFERRIN, FERRITIN in the last 72 hours. Studies/Results: No results found.  Medications: Infusions: . feeding supplement (OSMOLITE 1.5 CAL) 1,000 mL (01/20/21 2201)    Scheduled Medications: . aspirin  81 mg Per Tube Daily  . chlorhexidine gluconate (MEDLINE KIT)  15 mL Mouth Rinse BID  . Chlorhexidine Gluconate Cloth  6 each Topical Daily  . Chlorhexidine Gluconate Cloth  6 each Topical Q0600  . docusate  100 mg Per Tube BID  . feeding supplement  (PROSource TF)  90 mL Per Tube BID  . fiber  1 packet Per Tube BID  . free water  200 mL Per Tube Q6H  . insulin aspart  0-9 Units Subcutaneous Q4H  . mouth rinse  15 mL Mouth Rinse 10 times per day  . modafinil  100 mg Per Tube Daily  . pantoprazole sodium  40 mg Per Tube Daily  . polyethylene glycol  17 g Per Tube BID  . rosuvastatin  10 mg Per Tube Daily  . ticagrelor  90 mg Per Tube BID    have reviewed scheduled and prn medications.  Physical Exam: General: Critically ill looking male , intubated, eyes open, nods to questions Heart:RRR, s1s2 nl, no rubs Lungs: Coarse breath sound bilateral Abdomen:soft,  non-distended Extremities:No edema Dialysis Access: L IJ temporary HD catheter  Tremell Reimers B Jessicah Croll 01/22/2021,9:42 AM  LOS: 19 days

## 2021-01-22 NOTE — Progress Notes (Addendum)
Patient ID: [name], male   DOB: 06-24-1951, 70 y.o.   MRN: [id number]     Advanced Heart Failure Rounding Note  PCP-Cardiologist: None   Subjective:    4/20 Impella removed. MRI brain with numerous micro embolic infarctions. Off pressors.  4/21 lasix drip stopped.  4/22 started CVVH 4/24 CVVH stopped. Failed extubation. Re-intubated.  4/26 Extubated  5/1 Re-intubated  Remains on vent at 30%. Off pressors  Awake on vent. Will track and now will follow commands  Weight up. SCr seems to be leveling off at 2.9. BUN 95. Co-ox 74%. CVP 5   Objective:   Weight Range: 86.4 kg Body mass index is 25.13 kg/m.   Vital Signs:   Temp:  [98.4 F (36.9 C)-100.8 F (38.2 C)] 98.8 F (37.1 C) (05/03 0736) Pulse Rate:  [75-117] 75 (05/03 0600) Resp:  [17-27] 18 (05/03 0600) BP: (122-158)/(57-78) 127/60 (05/03 0600) SpO2:  [92 %-100 %] 96 % (05/03 0600) FiO2 (%):  [30 %] 30 % (05/03 0400) Weight:  [86.4 kg] 86.4 kg (05/03 0313) Last BM Date: 01/22/21  Weight change: Filed Weights   01/19/21 0635 01/19/21 1014 01/22/21 0313  Weight: 85 kg 85 kg 86.4 kg    Intake/Output:   Intake/Output Summary (Last 24 hours) at 01/22/2021 0808 Last data filed at 01/22/2021 0754 Gross per 24 hour  Intake 1320 ml  Output 1626 ml  Net -306 ml      Physical Exam   General: Sitting up in bed Will track and follow comamnds HEENT: normal + cor-trak. + ETT Neck: supple. LIJ HD cath Carotids 2+ bilat; no bruits. No lymphadenopathy or thryomegaly appreciated. Cor: PMI nondisplaced. Regular rate & rhythm. No rubs, gallops or murmurs. Lungs: coarse Abdomen: soft, nontender, nondistended. No hepatosplenomegaly. No bruits or masses. Good bowel sounds. Extremities: no cyanosis, clubbing, rash, trace edema Neuro: Sitting up in bed Will track and follow commands. Cannot move RUE  Telemetry   Sinus 70-80s Personally reviewed  Labs    CBC Recent Labs    01/20/21 0122 01/20/21 0140  01/21/21 0346  WBC 24.2*  --  22.0*  NEUTROABS  --   --  19.9*  HGB 8.2* 7.8* 8.0*  HCT 24.7* 23.0* 24.8*  MCV 99.2  --  96.5  PLT 451*  --  532   Basic Metabolic Panel Recent Labs    01/20/21 0122 01/20/21 0140 01/21/21 0346 01/22/21 0337  NA 141   < > 142 142  K 4.7   < > 3.8 3.7  CL 103  --  106 107  CO2 26  --  25 24  GLUCOSE 119*  --  159* 153*  BUN 62*  --  85* 94*  CREATININE 2.46*  --  2.96* 2.99*  CALCIUM 8.1*  --  8.2* 8.2*  MG 1.9  --  2.1 2.3  PHOS 6.2*  --   --   --    < > = values in this interval not displayed.   Liver Function Tests Recent Labs    01/20/21 0421 01/22/21 0337  AST 56* 36  ALT 104* 70*  ALKPHOS 109 108  BILITOT 0.7 0.7  PROT 6.1* 6.3*  ALBUMIN 2.4* 2.3*   No results for input(s): LIPASE, AMYLASE in the last 72 hours. Cardiac Enzymes No results for input(s): CKTOTAL, CKMB, CKMBINDEX, TROPONINI in the last 72 hours.  BNP: BNP (last 3 results) No results for input(s): BNP in the last 8760 hours.  ProBNP (last 3 results) No results  for input(s): PROBNP in the last 8760 hours.   D-Dimer No results for input(s): DDIMER in the last 72 hours. Hemoglobin A1C No results for input(s): HGBA1C in the last 72 hours. Fasting Lipid Panel Recent Labs    01/21/21 0346  TRIG 70   Thyroid Function Tests No results for input(s): TSH, T4TOTAL, T3FREE, THYROIDAB in the last 72 hours.  Invalid input(s): FREET3  Other results:   Imaging    No results found.   Medications:     Scheduled Medications: . aspirin  81 mg Per Tube Daily  . chlorhexidine gluconate (MEDLINE KIT)  15 mL Mouth Rinse BID  . Chlorhexidine Gluconate Cloth  6 each Topical Daily  . Chlorhexidine Gluconate Cloth  6 each Topical Q0600  . docusate  100 mg Per Tube BID  . feeding supplement (PROSource TF)  90 mL Per Tube BID  . fiber  1 packet Per Tube BID  . free water  200 mL Per Tube Q6H  . insulin aspart  0-9 Units Subcutaneous Q4H  . mouth rinse  15 mL  Mouth Rinse 10 times per day  . modafinil  100 mg Per Tube Daily  . pantoprazole sodium  40 mg Per Tube Daily  . polyethylene glycol  17 g Per Tube BID  . rosuvastatin  10 mg Per Tube Daily  . ticagrelor  90 mg Per Tube BID    Infusions: . feeding supplement (OSMOLITE 1.5 CAL) 1,000 mL (01/20/21 2201)    PRN Medications: sodium chloride, acetaminophen (TYLENOL) oral liquid 160 mg/5 mL, atropine, fentaNYL (SUBLIMAZE) injection, fentaNYL (SUBLIMAZE) injection, ipratropium-albuterol, ondansetron (ZOFRAN) IV, sodium chloride flush    Assessment/Plan   1. CAD/ Acute Inferior STEMI - emergent cath w/ pRCA occlusion s/p PCI + DES - Continue DAPT/statin - no s/s angina  2. VF Arrest - in setting of acute MI, now s/p revascularization  - rhythm stable.  - appears to have significant anoxic brain injury but improved today  3. Acute Systolic Heart Failure>>Cardiogenic Shock  - ICM Echo LVEF 20-25%, RV moderately reduced - Impella out 4/20. Off all pressors. Co-ox 74% - CVVH stopped 4/24. Volume status climbing slowly. - Titrate hydral/nitrates - No b-blocker, ARNI, MRA with AKI   4. Acute hypoxic respiratory failure - 4/24 failed extubation trial.  - 5/1 reintubated due to inability to handle secretions/hemoptysis - on minimal vent support - Will give one dose IV lasix today to help facilitate possible extubation - see discussion re: trach below  5. Nonoliguric AKI - due to ATN/shock - CVVH begun 4/22 and stopped 4/24. Had one session of iHD on 4/30 for clearance - SCr seems to be leveling off at 2.9. BUN 95. - Now watchful waiting. Not candidate for outpatient HD  6. Anoxic brain injury  - EEG w/ generalized periodic discharges with triphasic morphology + moderate to severe diffuse encephalopathy.  - MRI- numerous micro embolic infarctions . No hemorrhage or mass.  - CIR has turned down due to poor rehab potential. Recommending SNF - He appears to have significant anoxic  injury but is clearly following commands better today - I had discussion with his HCPOA, [name] yesterday. Given MSOF and evidence of significant anoxic brain injury, I am concerned that he may not be able  achieve meaningful outcome that he would tolerate given his pre-MI condition - Given progress today, I would advocate for ongoing care/rehab but would still consider limiting scope of aggressive care (I.e no trach, no further HD and no  CPR). If tolerates extubation and kidneys recover would continue with rehab to get as much function back as possible.    CRITICAL CARE Performed by: Glori Bickers  Total critical care time: 40 minutes  Critical care time was exclusive of separately billable procedures and treating other patients.  Critical care was necessary to treat or prevent imminent or life-threatening deterioration.  Critical care was time spent personally by me (independent of midlevel providers or residents) on the following activities: development of treatment plan with patient and/or surrogate as well as nursing, discussions with consultants, evaluation of patient's response to treatment, examination of patient, obtaining history from patient or surrogate, ordering and performing treatments and interventions, ordering and review of laboratory studies, ordering and review of radiographic studies, pulse oximetry and re-evaluation of patient's condition.     Length of Stay: 88  Glori Bickers, MD  8:08 AM

## 2021-01-22 NOTE — Progress Notes (Signed)
NAME:  [name], MRN:  [id number], DOB:  1951/01/22, LOS: 58 ADMISSION DATE:  01/03/2021, CONSULTATION DATE:  01/03/2021 REFERRING MD:  Dr. Haroldine Laws, CHIEF COMPLAINT:  STEMI/ cardiogenic shock  History of Present Illness:   70 year old male with PMHx HTN, HLD, CAD admitted as a STEMI who suffered cardiac arrest in hospital prior to emergent cardiac cath. PCI with stent to proximal RCA. EF 25-30%. Prolonged ICU stay for cardiogenic/hemorrhagic shock requiring vasopressors and transfusions. Mental status did not greatly improve as his hemodynamics did. MRI showed scattered punctate infarctions concerning for anoxic injury. AKI progressed to require CRRT, he has since been transitioned to [facility]. Extubated 4/26. Did not pass swallow due to aspiration risk. 4/30 he developed worsening hypoxia and developed hemoptysis with multiple large clots being expectorated.   Pertinent  Medical History  HTN, HLD, CAD  Significant Hospital Events: Including procedures, antibiotic start and stop dates in addition to other pertinent events   . 4/14 Admitted Cards with inferior STEMI-> bradycardia s/p TVP, cardiogenic shock and cardiac arrest s/p impella with PCI/stent to RCA, complicated by bleeding s/p 4 units PRBC thought 2/2 cangrelor, pending coags.  Has R IJ PA cath, TVP R groin, impella L groin . 4/15 Remains on impella, weaning pressors and inotropes. Started diuresis due to significantly increased oxygen requirements. . 4/18 Concern for seizure-like activity, Neuro consult, LTM/EEG, CT Head NAICA/no evidence of edema. Transitioned to ceftriaxone from cefepime. . 4/19 Remains on Impella, off pressors. Continuing diuresis with hopeful discontinuation of Impella, allowing for MRI 4/20-4/21. Continuing LTM EEG, given facial twitching/unclear neurologic status. . 4/20 >8L UOP/24H with Lasix gtt, Impella at P6, MRI Brain pending Impella removal . 4/24 CRRT stopped, reintubated . 4/25 - weaned all day. Good  response to diuretics . 5/1 transferred back to ICU for hemoptysis, hypoxic resp failure. Intubated emergently. . 5/2:  On cpap and doing ok.  Interim History / Subjective:  5/3: pt is more alert this am in that with some great delay will intermittently follow commands. On cpap. Attempting to optimize for one way extubation. Agree with Dr Haroldine Laws recommendations in light of slight improvement today. To reiterate recommend optimizing for one way extubation in hopes of renal recovery and cont rehab but no aggressive measures limiting function ie trach/reintubation, further dialysis or cpr if req.  5/2: tmax 100.8 overnight. uop down to ~857ml/24hr and while cr/bun are rising at less rapid rate they are still marginally up from yesterday. . coox 74 off inotropes.  HCPOA, Merry Proud has opted for one way extubation when optimized but note that pt remains full code. Will address this with him today. Pt did tolerate cpap yesterday so probably close to optimized esp in light of coox and oxygen req. Still not following commands and profoundly weak so only matter of time before this reoccurs.   Objective   Blood pressure 127/60, pulse 75, temperature 98.8 F (37.1 C), temperature source Oral, resp. rate 18, height 6\' 1"  (1.854 m), weight 86.4 kg, SpO2 96 %. CVP:  [3 mmHg-5 mmHg] 4 mmHg  Vent Mode: PRVC FiO2 (%):  [30 %] 30 % Set Rate:  [18 bmp] 18 bmp Vt Set:  [550 mL] 550 mL PEEP:  [5 cmH20] 5 cmH20 Pressure Support:  [5 cmH20] 5 cmH20 Plateau Pressure:  [13 cmH20-14 cmH20] 13 cmH20   Intake/Output Summary (Last 24 hours) at 01/22/2021 0800 Last data filed at 01/22/2021 0754 Gross per 24 hour  Intake 1320 ml  Output 1626 ml  Net -306 ml  Filed Weights   01/19/21 0635 01/19/21 1014 01/22/21 0313  Weight: 85 kg 85 kg 86.4 kg    Physical Examination: General:  Elderly appearing male in NAD with ett in place, sitting up in bed in nad Neuro: alert, with some delay will intermittently follow commands  today. Often looks confused when asked 2 step questions. (furrowing brow and not complying)  HEENT:  [place]/AT, No JVD noted, PERRL Cardiovascular:  RRR, no MRG Lungs:  Coarse throughout Abdomen:  Soft, non-distended Musculoskeletal:  No acute deformity Skin:  Intact, MMM   Labs/imaging that I have personally reviewed: (right click and "Reselect all SmartList Selections" daily)   BUN/Cr 85->95/2.9->3  Resolved Hospital Problem list   Hemorrhagic shock Lactic acidosis  Assessment & Plan:   Acute hypoxemic respiratory failure  Aspiration of blood into airway. Epistaxis vs upper airway bleed.  Poor airway protection - optimizing for extubation - on cpap and doing ok. Also did well yesterday.  -d/w Merry Proud potential DNR after extubation, discussed that would seem to be more consistent with pt's wishes.  - VAP bundle - Zosyn stopped 4/25 - Propofol and PRN fentanyl for RASS goal -1 to -2.   Acute inferior STEMI post PCI and stenting RCA, present on admission Cardiogenic shock, present on admission now resolved. Ventricular fibrillation, cardiac arrest Acute systolic heart failure, present on admission now resolved. - Holding isosorbide, hydralazine while hypotensive.  - Keep dual antiplatelet therapy aspirin plus Brilint, no further hemoptysis.  - Continue crestor per tube - HF team following  AKI - appears euvolemic at this time Hypernatremia - Nephrology following - Intermittent HD. Does not appear to be candidate for outpt dialysis so if indices cont to worsen family needs to further consider palliation.  - Free water flushes decreased  Anoxic brain injury- having some improvement on exam so giving more time Acute bilateral strokes, multiple microembolic infarctions on MRI Initial EEG negative, Twitching- no EEG correlate so thought less likely seizures - Supportive care - provigil Debilitation:  -profound weakness.  -will need extensive PT/OT  GOC - palliative following  and appreciated.   Best practice (right click and "Reselect all SmartList Selections" daily)  Diet:  Tube Feed  Pain/Anxiety/Delirium protocol (if indicated): Yes (RASS goal -1,-2)  VAP protocol (if indicated): Yes DVT prophylaxis: Contraindicated GI prophylaxis: PPI will add fiber for diarrhea. Glucose control:  SSI No and Basal insulin No Control is adequate.  Central venous access:  Yes, and it is still needed  Arterial line:  N/A Foley:  N/A  Mobility:  bed rest PT consulted: N/A Last date of multidisciplinary goals of care discussion daily with POA 5/3  Code Status:  full code Disposition: ICU   Critical care time: The patient is critically ill with multiple organ systems failure and requires high complexity decision making for assessment and support, frequent evaluation and titration of therapies, application of advanced monitoring technologies and extensive interpretation of multiple databases.  Critical care time 39 mins. This represents my time independent of the NPs time taking care of the pt. This is excluding procedures.    Audria Nine DO [facility] 01/22/2021, 8:00 AM See Amion for pager If no response to pager, please call [phone] until 1900 After 1900 please call [facility] [phone]

## 2021-01-23 DIAGNOSIS — R570 Cardiogenic shock: Secondary | ICD-10-CM | POA: Diagnosis not present

## 2021-01-23 DIAGNOSIS — R0902 Hypoxemia: Secondary | ICD-10-CM | POA: Diagnosis not present

## 2021-01-23 DIAGNOSIS — Z978 Presence of other specified devices: Secondary | ICD-10-CM | POA: Diagnosis not present

## 2021-01-23 LAB — COOXEMETRY PANEL
Carboxyhemoglobin: 1.3 % (ref 0.5–1.5)
Methemoglobin: 0.6 % (ref 0.0–1.5)
O2 Saturation: 70.8 %
Total hemoglobin: 8.3 g/dL — ABNORMAL LOW (ref 12.0–16.0)

## 2021-01-23 LAB — COMPREHENSIVE METABOLIC PANEL
ALT: 104 U/L — ABNORMAL HIGH (ref 0–44)
AST: 65 U/L — ABNORMAL HIGH (ref 15–41)
Albumin: 2.5 g/dL — ABNORMAL LOW (ref 3.5–5.0)
Alkaline Phosphatase: 121 U/L (ref 38–126)
Anion gap: 9 (ref 5–15)
BUN: 102 mg/dL — ABNORMAL HIGH (ref 8–23)
CO2: 25 mmol/L (ref 22–32)
Calcium: 8.4 mg/dL — ABNORMAL LOW (ref 8.9–10.3)
Chloride: 110 mmol/L (ref 98–111)
Creatinine, Ser: 3 mg/dL — ABNORMAL HIGH (ref 0.61–1.24)
GFR, Estimated: 22 mL/min — ABNORMAL LOW (ref >60)
Glucose, Bld: 158 mg/dL — ABNORMAL HIGH (ref 70–99)
Potassium: 3.5 mmol/L (ref 3.5–5.1)
Sodium: 144 mmol/L (ref 135–145)
Total Bilirubin: 0.9 mg/dL (ref 0.3–1.2)
Total Protein: 6.5 g/dL (ref 6.5–8.1)

## 2021-01-23 LAB — GLUCOSE, CAPILLARY
Glucose-Capillary: 114 mg/dL — ABNORMAL HIGH (ref 70–99)
Glucose-Capillary: 114 mg/dL — ABNORMAL HIGH (ref 70–99)
Glucose-Capillary: 121 mg/dL — ABNORMAL HIGH (ref 70–99)
Glucose-Capillary: 124 mg/dL — ABNORMAL HIGH (ref 70–99)
Glucose-Capillary: 132 mg/dL — ABNORMAL HIGH (ref 70–99)
Glucose-Capillary: 137 mg/dL — ABNORMAL HIGH (ref 70–99)

## 2021-01-23 LAB — MAGNESIUM: Magnesium: 2.4 mg/dL (ref 1.7–2.4)

## 2021-01-23 MED ORDER — ORAL CARE MOUTH RINSE
15.0000 mL | Freq: Two times a day (BID) | OROMUCOSAL | Status: DC
  Administered 2021-01-24 – 2021-02-02 (×18): 15 mL via OROMUCOSAL

## 2021-01-23 MED ORDER — HYDRALAZINE HCL 25 MG PO TABS
12.5000 mg | ORAL_TABLET | Freq: Three times a day (TID) | ORAL | Status: DC
  Administered 2021-01-23 – 2021-01-24 (×4): 12.5 mg
  Filled 2021-01-23 (×5): qty 0.5

## 2021-01-23 MED ORDER — ISOSORBIDE MONONITRATE ER 30 MG PO TB24
15.0000 mg | ORAL_TABLET | Freq: Every day | ORAL | Status: DC
  Administered 2021-01-23: 15 mg via ORAL
  Filled 2021-01-23 (×2): qty 1

## 2021-01-23 MED ORDER — HYDRALAZINE HCL 25 MG PO TABS
12.5000 mg | ORAL_TABLET | Freq: Three times a day (TID) | ORAL | Status: DC
  Filled 2021-01-23 (×2): qty 0.5

## 2021-01-23 MED ORDER — FUROSEMIDE 10 MG/ML IJ SOLN
80.0000 mg | Freq: Once | INTRAMUSCULAR | Status: AC
  Administered 2021-01-23: 80 mg via INTRAVENOUS
  Filled 2021-01-23: qty 8

## 2021-01-23 MED ORDER — POTASSIUM CHLORIDE 20 MEQ PO PACK
40.0000 meq | PACK | Freq: Once | ORAL | Status: AC
  Administered 2021-01-23: 40 meq
  Filled 2021-01-23: qty 2

## 2021-01-23 NOTE — Progress Notes (Signed)
Patient ID: [name], male   DOB: April 29, 1951, 70 y.o.   MRN: [id number]     Advanced Heart Failure Rounding Note  PCP-Cardiologist: None   Subjective:    4/20 Impella removed. MRI brain with numerous micro embolic infarctions. Off pressors.  4/21 lasix drip stopped.  4/22 started CVVH 4/24 CVVH stopped. Failed extubation. Re-intubated.  4/26 Extubated  5/1 Re-intubated  Remains on vent at 30%. Off pressors  Awake on vent will follow commands. Seems to be trying to talk over ETT (was aphasic before) Excellent diuresis with IV lasix yesterday. Weight down 6 pounds. SCr stable at 3.0 but BUN continues to climb 94 -> 102  Co-ox 71%   Objective:   Weight Range: 83.5 kg Body mass index is 24.29 kg/m.   Vital Signs:   Temp:  [97.7 F (36.5 C)-99.9 F (37.7 C)] 98.7 F (37.1 C) (05/04 0722) Pulse Rate:  [76-99] 87 (05/04 0700) Resp:  [16-28] 20 (05/04 0700) BP: (126-162)/(58-95) 151/69 (05/04 0700) SpO2:  [98 %-100 %] 100 % (05/04 0700) FiO2 (%):  [30 %] 30 % (05/04 0400) Weight:  [83.5 kg] 83.5 kg (05/04 0348) Last BM Date: 01/23/21  Weight change: Filed Weights   01/19/21 1014 01/22/21 0313 01/23/21 0348  Weight: 85 kg 86.4 kg 83.5 kg    Intake/Output:   Intake/Output Summary (Last 24 hours) at 01/23/2021 0756 Last data filed at 01/23/2021 0500 Gross per 24 hour  Intake 1320 ml  Output 3375 ml  Net -2055 ml      Physical Exam   General: Sitting up in bed Will track and follow comamnds HEENT: normal + cor-trak and ETT Neck: supple. LIJ HD cath  . Carotids 2+ bilat; no bruits. No lymphadenopathy or thryomegaly appreciated. Cor: PMI nondisplaced. Regular rate & rhythm. No rubs, gallops or murmurs. Lungs: clear Abdomen: soft, nontender, nondistended. No hepatosplenomegaly. No bruits or masses. Good bowel sounds. Extremities: no cyanosis, clubbing, rash, tr  edema Neuro: as above  Telemetry   Sinus 80s Personally reviewed  Labs    CBC Recent Labs     01/21/21 0346  WBC 22.0*  NEUTROABS 19.9*  HGB 8.0*  HCT 24.8*  MCV 96.5  PLT 081   Basic Metabolic Panel Recent Labs    01/22/21 0337 01/23/21 0405  NA 142 144  K 3.7 3.5  CL 107 110  CO2 24 25  GLUCOSE 153* 158*  BUN 94* 102*  CREATININE 2.99* 3.00*  CALCIUM 8.2* 8.4*  MG 2.3 2.4   Liver Function Tests Recent Labs    01/22/21 0337 01/23/21 0405  AST 36 65*  ALT 70* 104*  ALKPHOS 108 121  BILITOT 0.7 0.9  PROT 6.3* 6.5  ALBUMIN 2.3* 2.5*   No results for input(s): LIPASE, AMYLASE in the last 72 hours. Cardiac Enzymes No results for input(s): CKTOTAL, CKMB, CKMBINDEX, TROPONINI in the last 72 hours.  BNP: BNP (last 3 results) No results for input(s): BNP in the last 8760 hours.  ProBNP (last 3 results) No results for input(s): PROBNP in the last 8760 hours.   D-Dimer No results for input(s): DDIMER in the last 72 hours. Hemoglobin A1C No results for input(s): HGBA1C in the last 72 hours. Fasting Lipid Panel Recent Labs    01/21/21 0346  TRIG 70   Thyroid Function Tests No results for input(s): TSH, T4TOTAL, T3FREE, THYROIDAB in the last 72 hours.  Invalid input(s): FREET3  Other results:   Imaging    No results found.   Medications:  Scheduled Medications: . aspirin  81 mg Per Tube Daily  . chlorhexidine gluconate (MEDLINE KIT)  15 mL Mouth Rinse BID  . Chlorhexidine Gluconate Cloth  6 each Topical Daily  . docusate  100 mg Per Tube BID  . feeding supplement (PROSource TF)  90 mL Per Tube BID  . fiber  1 packet Per Tube BID  . free water  200 mL Per Tube Q6H  . insulin aspart  0-9 Units Subcutaneous Q4H  . mouth rinse  15 mL Mouth Rinse 10 times per day  . modafinil  100 mg Per Tube Daily  . pantoprazole sodium  40 mg Per Tube Daily  . polyethylene glycol  17 g Per Tube BID  . rosuvastatin  10 mg Per Tube Daily  . ticagrelor  90 mg Per Tube BID    Infusions: . feeding supplement (OSMOLITE 1.5 CAL) 1,000 mL (01/22/21 1237)     PRN Medications: acetaminophen (TYLENOL) oral liquid 160 mg/5 mL, atropine, fentaNYL (SUBLIMAZE) injection, fentaNYL (SUBLIMAZE) injection, ipratropium-albuterol, ondansetron (ZOFRAN) IV, sodium chloride flush    Assessment/Plan   1. CAD/ Acute Inferior STEMI - emergent cath w/ pRCA occlusion s/p PCI + DES - Continue DAPT/statin - no s/s angina  2. VF Arrest - in setting of acute MI, now s/p revascularization  - rhythm stable.  - appears to have significant anoxic brain injury but improved somewhat over last 24-48 hours  3. Acute Systolic Heart Failure>>Cardiogenic Shock  - ICM Echo LVEF 20-25%, RV moderately reduced - Impella out 4/20. Off all pressors. Co-ox 71% - CVVH stopped 4/24. Volume status climbing slowly. - No b-blocker, ARNI, MRA with AKI  - Hydral and nitrates stopped with low BP. BP now climbing. Will re-initiate low-dose  4. Acute hypoxic respiratory failure - 4/24 failed extubation trial.  - 5/1 reintubated due to inability to handle secretions/hemoptysis - on minimal vent support. Did ok with CPAP trial but still very weak. Hopefully can extubate soon  - see discussion re: trach below  5. Nonoliguric AKI - due to ATN/shock - CVVH begun 4/22 and stopped 4/24. Had one session of iHD on 4/30 for clearance - SCr seems to be leveling off at 3.0. BUN 102 - Now watchful waiting. Not candidate for outpatient HD - may need another "touch-up" session for clearance  6. Anoxic brain injury  - EEG w/ generalized periodic discharges with triphasic morphology + moderate to severe diffuse encephalopathy.  - MRI- numerous micro embolic infarctions . No hemorrhage or mass.  - CIR has turned down due to poor rehab potential. Recommending SNF - He appears to have significant anoxic injury but is clearly following commands better today - I had discussion with his HCPOA, Jeff Lawson earlier this week Given MSOF and evidence of significant anoxic brain injury, I am concerned  that he may not be able  achieve meaningful outcome that he would tolerate given his pre-MI condition - Given progress over last 24-48 hours, I would advocate for ongoing care with the hopes of extubating him and discussing goals of care directly with him regarding trach (if needed)  and other possible aggressive therapies. Palliative following   CRITICAL CARE Performed by: Bensimhon, Daniel  Total critical care time: 35 minutes  Critical care time was exclusive of separately billable procedures and treating other patients.  Critical care was necessary to treat or prevent imminent or life-threatening deterioration.  Critical care was time spent personally by me (independent of midlevel providers or residents) on the following activities:   development of treatment plan with patient and/or surrogate as well as nursing, discussions with consultants, evaluation of patient's response to treatment, examination of patient, obtaining history from patient or surrogate, ordering and performing treatments and interventions, ordering and review of laboratory studies, ordering and review of radiographic studies, pulse oximetry and re-evaluation of patient's condition.     Length of Stay: 20  Daniel Bensimhon, MD  7:56 AM       

## 2021-01-23 NOTE — Progress Notes (Signed)
Physical Therapy Treatment Patient Details Name: [name] MRN: [id number] DOB: 02-26-1951 Today's Date: 01/23/2021    History of Present Illness Pt is 70 yo admitted 4/14 with chest pain and STEMI with bradycardia and hypotension. Pt taken for urgent cath with temporary pacer placed but pt decompensated to cardiac arrest with intubation and CPR. Stent placed and pt with further complication of cardiogenic shock with Impella placed. 4/18 seizure like activity, Impella removed 4/20, 4/20 MRI with scattered micro embolic infarctions. Extubated 4/24 and reintubated 2 hrs later. Extubated 4/26. Reintubated 01/20/21. Extubated 01/23/21.  PMHx: HTN, HLD, CAD    PT Comments    Pt admitted with above diagnosis. Pt was able to sit EOB for up to 10 minutes with min guard assist. Pt was able to stand to his feet with mod assist of 2 and cues and able to take a few side steps to [facility].  Pt was positioned well once back in bed and in chair position. Pt responding to commands today with 3 second delay. Pt with incr participation, conversing and much more appropriate overall.  Followed commands for exercise, bed mobility and transfer sit to stand. Pt did keep asking to go to bathroom at end of session and gave reminders to pt that he has "tubes" in buttocks and for urination and he can go ahead and go. Pt agreed that he understood but would then ask to use bathroom again.    Pt currently with functional limitations due to balance and endurance deficits. Pt will benefit from skilled PT to increase their independence and safety with mobility to allow discharge to the venue listed below.     Follow Up Recommendations  Supervision/Assistance - 24 hour;SNF     Equipment Recommendations  Wheelchair cushion (measurements PT);3in1 (PT);Wheelchair (measurements PT);Hospital bed    Recommendations for Other Services       Precautions / Restrictions Precautions Precautions: Fall Precaution Comments: globally  aphasic?, cortrak, flexiseal Restrictions Weight Bearing Restrictions: No    Mobility  Bed Mobility Overal bed mobility: Needs Assistance Bed Mobility: Rolling;Supine to Sit Rolling: Min assist;+2 for safety/equipment   Supine to sit: Min assist;+2 for safety/equipment     General bed mobility comments: Noted condom cath leaked prior to moving pt and bed soaked and gown wet.  PT replaced condom cath and cleaned pt.  Pt needed min assist for LEs off bed and elevation of trunk. Once at EOB, changed linens while pt sitting.  See below for stand to finish changing out linens so pt was left in clean bed.    Transfers Overall transfer level: Needs assistance Equipment used: 2 person hand held assist Transfers: Sit to/from Stand Sit to Stand: +2 physical assistance;From elevated surface;Mod assist         General transfer comment: Pt was able to stand with +2 mod assist with cues to stay upright with pt flexing trunk slightly.  Pt min assist once in upright standing with PT blocking knees for safety.  Pt did follow commands to take a few steps to the left to [facility] with min to mod assist of 2 with pt bil knee instability and sitting prior to PT command but assist to lower to sitting.  Pt then assisted back to bed as above.  Ambulation/Gait             General Gait Details: unable   Stairs             Wheelchair Mobility    Modified Rankin (Stroke Patients  Only) Modified Rankin (Stroke Patients Only) Pre-Morbid Rankin Score: No symptoms Modified Rankin: Severe disability     Balance Overall balance assessment: Needs assistance Sitting-balance support: Feet supported;No upper extremity supported Sitting balance-Leahy Scale: Fair Sitting balance - Comments: Pt was able to sit EOB without physical assist without UEs with PT providing min guard assist.  Cannot withstand challenges to balance however did well with sitting at EOB.   Standing balance support: Bilateral upper  extremity supported Standing balance-Leahy Scale: Poor Standing balance comment: relies on bil UE support and support blocking knees to stand statically. Also with +2 mod assist by PT                            Cognition Arousal/Alertness: Awake/alert Behavior During Therapy: Flat affect Overall Cognitive Status: Difficult to assess Area of Impairment: Orientation;Attention;Memory;Following commands;Awareness;Problem solving                 Orientation Level: Situation;Time;Place Current Attention Level: Focused Memory: Decreased recall of precautions;Decreased short-term memory Following Commands: Follows one step commands inconsistently;Follows one step commands with increased time   Awareness: Intellectual Problem Solving: Slow processing;Decreased initiation;Difficulty sequencing;Requires verbal cues;Requires tactile cues General Comments: following commands with delay, needed cues to orientation stating it was April and Tues.  Pt kept saying he had to use the bathroom and explained multiple times that pt has tube for BM and urine but pt not retaining information      Exercises General Exercises - Upper Extremity Shoulder Flexion: Both;10 reps;Supine;AROM Elbow Flexion: Both;10 reps;Supine;AROM Elbow Extension: 10 reps;Both;Supine;AROM General Exercises - Lower Extremity Ankle Circles/Pumps: Both;Supine;AROM;10 reps Quad Sets: AROM;Both;Supine;10 reps Long Arc Quad: Both;Seated;AROM;5 reps Heel Slides: AAROM;Both;Supine;10 reps    General Comments General comments (skin integrity, edema, etc.): VSS with pt on 5LO2 on arrival but it was not plugged into container and was 96%.  Talked with nurse as O2 on RA at end of session still 100%.  Nurse asked PT to replace O2 at 2L.      Pertinent Vitals/Pain Pain Assessment: Faces Faces Pain Scale: Hurts little more Pain Location: generalized Pain Descriptors / Indicators: Grimacing Pain Intervention(s): Limited  activity within patient's tolerance;Monitored during session;Repositioned    Home Living                      Prior Function            PT Goals (current goals can now be found in the care plan section) Acute Rehab PT Goals Patient Stated Goal: unable Progress towards PT goals: Progressing toward goals    Frequency    Min 3X/week      PT Plan Current plan remains appropriate;Frequency needs to be updated    Co-evaluation              AM-PAC PT "6 Clicks" Mobility   Outcome Measure  Help needed turning from your back to your side while in a flat bed without using bedrails?: A Little Help needed moving from lying on your back to sitting on the side of a flat bed without using bedrails?: A Lot Help needed moving to and from a bed to a chair (including a wheelchair)?: Total Help needed standing up from a chair using your arms (e.g., wheelchair or bedside chair)?: Total Help needed to walk in hospital room?: Total Help needed climbing 3-5 steps with a railing? : Total 6 Click Score: 9    End of  Session Equipment Utilized During Treatment: Gait belt;Oxygen Activity Tolerance: Patient limited by fatigue Patient left: with call bell/phone within reach;in bed;with bed alarm set;with restraints reapplied;with SCD's reapplied (in chair position) Nurse Communication: Mobility status PT Visit Diagnosis: Unsteadiness on feet (R26.81);Muscle weakness (generalized) (M62.81);Hemiplegia and hemiparesis Hemiplegia - Right/Left: Right Hemiplegia - dominant/non-dominant: Non-dominant Hemiplegia - caused by: Cerebral infarction     Time: 3546-5681 PT Time Calculation (min) (ACUTE ONLY): 27 min  Charges:  $Therapeutic Exercise: 8-22 mins $Therapeutic Activity: 8-22 mins                     Lyrical Sowle M,PT Acute Rehab Services [phone] (pager)   Alvira Philips 01/23/2021, 2:18 PM

## 2021-01-23 NOTE — Procedures (Signed)
Extubation Procedure Note  Patient Details:   Name: [name] DOB: 09-18-1951 MRN: [id number]   Airway Documentation:    Vent end date: 01/23/21 Vent end time: 1150   Evaluation  O2 sats: stable throughout Complications: No apparent complications Patient did tolerate procedure well. Bilateral Breath Sounds: Clear,Diminished   Yes   Pt extubated to 4L  per MD order. Positive cuff leak noted prior to extubation. Pt able to speak and has a weak non-productive cough post extubation. Pt encouraged to use [place] to clear secretions.   Jesse Sans 01/23/2021, 2:24 PM

## 2021-01-23 NOTE — Progress Notes (Addendum)
Lengthy d/w hcpoa jeff and RN kara at bedside. D/w current state and hopefully recovery for pt who is now extubated. Went over living wills and previous discussions that they have had together.   Ultimately decision was to cont with current care, hopes of rehab and return to close previous quality of life. However if pt deteriorates, would not pursue intubation/cpr/defib etc but would transition to comfort if necessary.   Will follow pt's previously expressed wishes of DNR.  Pt is unable to participate in conversation 2/2 confusion.   advanced care planning time: The patient is critically ill with multiple organ systems failure and requires high complexity decision making for assessment and support, frequent evaluation and titration of therapies, application of advanced monitoring technologies and extensive interpretation of multiple databases.  Critical care time 38 mins. This represents my time independent of the NPs time taking care of the pt. This is excluding procedures.    Audria Nine DO [facility] 01/23/2021, 5:48 PM See Amion for pager If no response to pager, please call [phone] until 1900 After 1900 please call [facility] [phone]

## 2021-01-23 NOTE — Progress Notes (Signed)
[facility] NEPHROLOGY PROGRESS NOTE  Assessment/ Plan: Pt is a 70 y.o. yo male with history of HTN, HLD, CAD presented with chest pain found to have STEMI.  He underwent left heart cath, placement of Impella device, had V. fib arrest, mechanical ventilation for respiratory failure and AKI.  #Acute kidney injury due to cardiogenic/hemorrhagic shock.  UA bland and kidney ultrasound unremarkable.  Required CRRT from 4/22-4/24.  Patient is nonoliguric and volume status acceptable.  Received intermittent hemodialysis on 4/30 for clearance.  No indications for HD today, daily reassessment, still too tenuous for HD cath removal.  Repeat bolus lasix dose today.    #Hypernatremia due to increase free water excretion and recovering AKI.  Stable today.   #CAD/acute STEMI required emergent cath with stent placement.  EF 20 to 25%.  AHF following.  #V. fib arrest/cardiogenic shock, acute systolic CHF: Arrest in the setting of acute MI.  Impella was removed.  Now off of pressors or inotropes.  Volume status is acceptable.  #Ventilator dependent respiratory failure: Initially extubated and transferred to floor.  Minimal FIO2,CCM hoping for extubation today.  Management per pulmonary team.  #Anemia due to acute blood loss: Required multiple blood transfusion.  Transfuse as needed.  #Hypokalemia: Potassium level improved.  #Encephalopathy: Anoxic injury probable. Monitor  Subjective:   NAEON, no pressors  Good UOP with single IV lasix dose 3.7L in 24h  BUN up. SCr stable, K 3.5On high protein enteral nutrition  Awake, head gestures to basic questions,  Has L IJ Temp HD cath present  Objective Vital signs in last 24 hours: Vitals:   01/23/21 0800 01/23/21 0900 01/23/21 1000 01/23/21 1129  BP: (!) 147/79 (!) 148/74 140/75   Pulse: 100 90    Resp: (!) 23 20    Temp:    99 F (37.2 C)  TempSrc:    Axillary  SpO2: 100% 100%    Weight:      Height:       Weight change: -2.9  kg  Intake/Output Summary (Last 24 hours) at 01/23/2021 1142 Last data filed at 01/23/2021 0800 Gross per 24 hour  Intake 4460 ml  Output 3265 ml  Net 1195 ml       Labs: Basic Metabolic Panel: Recent Labs  Lab 01/20/21 0122 01/20/21 0140 01/21/21 0346 01/22/21 0337 01/23/21 0405  NA 141   < > 142 142 144  K 4.7   < > 3.8 3.7 3.5  CL 103  --  106 107 110  CO2 26  --  _0 GLUCOSE 119*  --  159* 153* 158*  BUN 62*  --  85* 94* 102*  CREATININE 2.46*  --  2.96* 2.99* 3.00*  CALCIUM 8.1*  --  8.2* 8.2* 8.4*  PHOS 6.2*  --   --   --   --    < > = values in this interval not displayed.   Liver Function Tests: Recent Labs  Lab 01/20/21 0421 01/22/21 0337 01/23/21 0405  AST 56* 36 65*  ALT 104* 70* 104*  ALKPHOS 109 108 121  BILITOT 0.7 0.7 0.9  PROT 6.1* 6.3* 6.5  ALBUMIN 2.4* 2.3* 2.5*   No results for input(s): LIPASE, AMYLASE in the last 168 hours. Recent Labs  Lab 01/20/21 0518  AMMONIA 27   CBC: Recent Labs  Lab 01/17/21 0417 01/19/21 0626 01/20/21 0122 01/20/21 0140 01/21/21 0346  WBC 11.7* 12.7* 24.2*  --  22.0*  NEUTROABS  --   --   --   --  19.9*  HGB 8.3* 8.8* 8.2* 7.8* 8.0*  HCT 26.1* 27.5* 24.7* 23.0* 24.8*  MCV 93.2 97.2 99.2  --  96.5  PLT 394 478* 451*  --  391   Cardiac Enzymes: No results for input(s): CKTOTAL, CKMB, CKMBINDEX, TROPONINI in the last 168 hours. CBG: Recent Labs  Lab 01/22/21 1924 01/22/21 2316 01/23/21 0318 01/23/21 0721 01/23/21 1127  GLUCAP 147* 134* 124* 137* 132*    Iron Studies:  No results for input(s): IRON, TIBC, TRANSFERRIN, FERRITIN in the last 72 hours. Studies/Results: No results found.  Medications: Infusions: . feeding supplement (OSMOLITE 1.5 CAL) 1,000 mL (01/22/21 1237)    Scheduled Medications: . aspirin  81 mg Per Tube Daily  . chlorhexidine gluconate (MEDLINE KIT)  15 mL Mouth Rinse BID  . Chlorhexidine Gluconate Cloth  6 each Topical Daily  . docusate  100 mg Per Tube BID  .  feeding supplement (PROSource TF)  90 mL Per Tube BID  . fiber  1 packet Per Tube BID  . free water  200 mL Per Tube Q6H  . hydrALAZINE  12.5 mg Per Tube Q8H  . insulin aspart  0-9 Units Subcutaneous Q4H  . isosorbide mononitrate  15 mg Oral Daily  . mouth rinse  15 mL Mouth Rinse 10 times per day  . modafinil  100 mg Per Tube Daily  . pantoprazole sodium  40 mg Per Tube Daily  . polyethylene glycol  17 g Per Tube BID  . rosuvastatin  10 mg Per Tube Daily  . ticagrelor  90 mg Per Tube BID    have reviewed scheduled and prn medications.  Physical Exam: General: Critically ill looking male , intubated, eyes open, nods to questions Heart:RRR, s1s2 nl, no rubs Lungs: Coarse breath sound bilateral Abdomen:soft,  non-distended Extremities:No edema Dialysis Access: L IJ temporary HD catheter  Karysa Heft B Huey Scalia 01/23/2021,11:42 AM  LOS: 20 days

## 2021-01-23 NOTE — Progress Notes (Signed)
NAME:  [name], MRN:  [id number], DOB:  1950/11/13, LOS: 59 ADMISSION DATE:  01/03/2021, CONSULTATION DATE:  01/03/2021 REFERRING MD:  Dr. Haroldine Laws, CHIEF COMPLAINT:  STEMI/ cardiogenic shock  History of Present Illness:   70 year old male with PMHx HTN, HLD, CAD admitted as a STEMI who suffered cardiac arrest in hospital prior to emergent cardiac cath. PCI with stent to proximal RCA. EF 25-30%. Prolonged ICU stay for cardiogenic/hemorrhagic shock requiring vasopressors and transfusions. Mental status did not greatly improve as his hemodynamics did. MRI showed scattered punctate infarctions concerning for anoxic injury. AKI progressed to require CRRT, he has since been transitioned to [facility]. Extubated 4/26. Did not pass swallow due to aspiration risk. 4/30 he developed worsening hypoxia and developed hemoptysis with multiple large clots being expectorated.   Pertinent  Medical History  HTN, HLD, CAD  Significant Hospital Events: Including procedures, antibiotic start and stop dates in addition to other pertinent events   . 4/14 Admitted Cards with inferior STEMI-> bradycardia s/p TVP, cardiogenic shock and cardiac arrest s/p impella with PCI/stent to RCA, complicated by bleeding s/p 4 units PRBC thought 2/2 cangrelor, pending coags.  Has R IJ PA cath, TVP R groin, impella L groin . 4/15 Remains on impella, weaning pressors and inotropes. Started diuresis due to significantly increased oxygen requirements. . 4/18 Concern for seizure-like activity, Neuro consult, LTM/EEG, CT Head NAICA/no evidence of edema. Transitioned to ceftriaxone from cefepime. . 4/19 Remains on Impella, off pressors. Continuing diuresis with hopeful discontinuation of Impella, allowing for MRI 4/20-4/21. Continuing LTM EEG, given facial twitching/unclear neurologic status. . 4/20 >8L UOP/24H with Lasix gtt, Impella at P6, MRI Brain pending Impella removal . 4/24 CRRT stopped, reintubated . 4/25 - weaned all day. Good  response to diuretics . 5/1 transferred back to ICU for hemoptysis, hypoxic resp failure. Intubated emergently. . 5/2:  On cpap and doing ok. . 5/3: pt is more alert this am in that with some great delay will intermittently follow commands. On cpap. Attempting to optimize for one way extubation. Agree with Dr Haroldine Laws recommendations in light of slight improvement today . 5/4 tolerating SBT well with significantly improved mentation  Interim History / Subjective:  No acute events overnight   Objective   Blood pressure (!) 141/77, pulse 86, temperature 98.7 F (37.1 C), temperature source Oral, resp. rate (!) 22, height 6\' 1"  (1.854 m), weight 83.5 kg, SpO2 100 %. CVP:  [3 mmHg] 3 mmHg  Vent Mode: PRVC FiO2 (%):  [30 %] 30 % Set Rate:  [18 bmp] 18 bmp Vt Set:  [550 mL] 550 mL PEEP:  [5 cmH20] 5 cmH20 Pressure Support:  [5 cmH20] 5 cmH20 Plateau Pressure:  [14 cmH20-24 cmH20] 14 cmH20   Intake/Output Summary (Last 24 hours) at 01/23/2021 0806 Last data filed at 01/23/2021 0500 Gross per 24 hour  Intake 1260 ml  Output 3375 ml  Net -2115 ml   Filed Weights   01/19/21 1014 01/22/21 0313 01/23/21 0348  Weight: 85 kg 86.4 kg 83.5 kg    Physical Examination: General: Chronically ill appearing elderly male on mechanical ventilation, in NAD HEENT: ETT, MM pink/moist, PERRL,  Neuro: Alert and abl to follow simple commands  CV: s1s2 regular rate and rhythm, no murmur, rubs, or gallops,  PULM:  Clear to ascultation, no added breath sounds, no increased work of breathing  GI: soft, bowel sounds active in all 4 quadrants, non-tender, non-distended, tolerating TF Extremities: warm/dry, no edema  Skin: no rashes or lesions  Labs/imaging that I have personally reviewed:    Resolved Hospital Problem list   Hemorrhagic shock Lactic acidosis  Assessment & Plan:   Acute hypoxemic respiratory failure  Aspiration of blood into airway.  -Epistaxis vs upper airway bleed.  Poor airway  protection - optimizing for extubation - on cpap and doing ok. Also did well yesterday.  -d/w Merry Proud potential DNR after extubation, discussed that would seem to be more consistent with pt's wishes.  P: Currently tolerating SBT well hope to extubate today  Continue ventilator support with lung protective strategies  Wean PEEP and FiO2 for sats greater than 90%. Head of bed elevated 30 degrees. Plateau pressures less than 30 cm H20.  Follow intermittent chest x-ray and ABG.   SAT/SBT as tolerated, mentation preclude extubation  Ensure adequate pulmonary hygiene  ABT stopped 4/25 VAP bundle in place  PAD protocol  Acute inferior STEMI  -post PCI and stenting RCA, present on admission Cardiogenic shock  -present on admission now resolved. Ventricular fibrillation, cardiac arrest Acute systolic heart failure, present on admission now resolved P: Heart failure following Home Isosorbide, and hydralizine currently   Continue DAPT plus ASA and Brilinta  Continue statin   AKI - appears euvolemic at this time Hypernatremia -Does not appear to be candidate for outpt dialysis so if indices cont to worsen family needs to further consider palliation.  P: Diuresed well with IV Lasix 5/4 Nephrology following, appreciate assistance  Follow renal function / urine output Trend Bmet Avoid nephrotoxins Ensure adequate renal perfusion   Anoxic brain injury - having some improvement on exam so giving more time Acute bilateral strokes, multiple microembolic infarctions on MRI Twitching - Initial EEG negativeno repeat EEG correlate so thought less likely seizures P: Maintain neuro protective measures; goal for eurothermia, euglycemia, eunatermia, normoxia, and PCO2 goal of 35-40 Nutrition and bowel regiment  Seizure precautions  Aspirations precautions  Continue Provigil   Debilitation -profound weakness.  P: PT/OT efforts as able   GOC - palliative following and appreciated.   P: Hopefully for extubation today and allow patient to participate in further Nathalie discussions   Best practice   Diet:  Tube Feed  Pain/Anxiety/Delirium protocol (if indicated): Yes (RASS goal -1,-2)  VAP protocol (if indicated): Yes DVT prophylaxis: Contraindicated GI prophylaxis: PPI will add fiber for diarrhea. Glucose control:  SSI No and Basal insulin No Control is adequate.  Central venous access:  Yes, and it is still needed for iHD Arterial line:  N/A Foley:  N/A  Mobility:  bed rest PT consulted: N/A Last date of multidisciplinary goals of care discussion daily with POA 5/4 Code Status:  full code Disposition: ICU   Critical care time:    Performed by: Johnsie Cancel  Total critical care time: 34 minutes  Critical care time was exclusive of separately billable procedures and treating other patients.  Critical care was necessary to treat or prevent imminent or life-threatening deterioration.  Critical care was time spent personally by me on the following activities: development of treatment plan with patient and/or surrogate as well as nursing, discussions with consultants, evaluation of patient's response to treatment, examination of patient, obtaining history from patient or surrogate, ordering and performing treatments and interventions, ordering and review of laboratory studies, ordering and review of radiographic studies, pulse oximetry and re-evaluation of patient's condition.  Johnsie Cancel, NP-C [facility] Personal contact information can be found on Amion  01/23/2021, 8:18 AM

## 2021-01-23 NOTE — Progress Notes (Signed)
NAME:  [name], MRN:  [id number], DOB:  1950-12-21, LOS: 24 ADMISSION DATE:  01/03/2021, CONSULTATION DATE:  01/03/2021 REFERRING MD:  Dr. Haroldine Laws, CHIEF COMPLAINT:  STEMI/ cardiogenic shock  History of Present Illness:   70 year old male with PMHx HTN, HLD, CAD admitted as a STEMI who suffered cardiac arrest in hospital prior to emergent cardiac cath. PCI with stent to proximal RCA. EF 25-30%. Prolonged ICU stay for cardiogenic/hemorrhagic shock requiring vasopressors and transfusions. Mental status did not greatly improve as his hemodynamics did. MRI showed scattered punctate infarctions concerning for anoxic injury. AKI progressed to require CRRT, he has since been transitioned to [facility]. Extubated 4/26. Did not pass swallow due to aspiration risk. 4/30 he developed worsening hypoxia and developed hemoptysis with multiple large clots being expectorated.   Pertinent  Medical History  HTN, HLD, CAD  Significant Hospital Events: Including procedures, antibiotic start and stop dates in addition to other pertinent events   . 4/14 Admitted Cards with inferior STEMI-> bradycardia s/p TVP, cardiogenic shock and cardiac arrest s/p impella with PCI/stent to RCA, complicated by bleeding s/p 4 units PRBC thought 2/2 cangrelor, pending coags.  Has R IJ PA cath, TVP R groin, impella L groin . 4/15 Remains on impella, weaning pressors and inotropes. Started diuresis due to significantly increased oxygen requirements. . 4/18 Concern for seizure-like activity, Neuro consult, LTM/EEG, CT Head NAICA/no evidence of edema. Transitioned to ceftriaxone from cefepime. . 4/19 Remains on Impella, off pressors. Continuing diuresis with hopeful discontinuation of Impella, allowing for MRI 4/20-4/21. Continuing LTM EEG, given facial twitching/unclear neurologic status. . 4/20 >8L UOP/24H with Lasix gtt, Impella at P6, MRI Brain pending Impella removal . 4/24 CRRT stopped, reintubated . 4/25 - weaned all day. Good  response to diuretics . 5/1 transferred back to ICU for hemoptysis, hypoxic resp failure. Intubated emergently. . 5/2:  On cpap and doing ok. . 5/4: extubated  Interim History / Subjective:  5/5: extubated and doing ok. Can follow simple commands but not consistently oriented and struggles with some comprehension of condition and previously occurring events.  5/4: awaiting hcpoa decision on goals of care post extubation should pt decompensate prior to extubation however pt is stable and optimized for extubation today so will need answer today. Hopefully pt will be able to participate as well. coox 70.8 uop adequate, cr relatively stable but BUN climbing  5/3: pt is more alert this am in that with some great delay will intermittently follow commands. On cpap. Attempting to optimize for one way extubation. Agree with Dr Haroldine Laws recommendations in light of slight improvement today. To reiterate recommend optimizing for one way extubation in hopes of renal recovery and cont rehab but no aggressive measures limiting function ie trach/reintubation, further dialysis or cpr if req.  5/2: tmax 100.8 overnight. uop down to ~841ml/24hr and while cr/bun are rising at less rapid rate they are still marginally up from yesterday. . coox 74 off inotropes.  HCPOA, Merry Proud has opted for one way extubation when optimized but note that pt remains full code. Will address this with him today. Pt did tolerate cpap yesterday so probably close to optimized esp in light of coox and oxygen req. Still not following commands and profoundly weak so only matter of time before this reoccurs.   Objective   Blood pressure (!) 141/77, pulse 86, temperature 98.7 F (37.1 C), temperature source Oral, resp. rate (!) 22, height 6\' 1"  (1.854 m), weight 83.5 kg, SpO2 100 %. CVP:  [3 mmHg] 3  mmHg  Vent Mode: PRVC FiO2 (%):  [30 %] 30 % Set Rate:  [18 bmp] 18 bmp Vt Set:  [550 mL] 550 mL PEEP:  [5 cmH20] 5 cmH20 Pressure Support:  [5  cmH20] 5 cmH20 Plateau Pressure:  [14 cmH20-24 cmH20] 16 cmH20   Intake/Output Summary (Last 24 hours) at 01/23/2021 0809 Last data filed at 01/23/2021 0500 Gross per 24 hour  Intake 1260 ml  Output 3375 ml  Net -2115 ml   Filed Weights   01/19/21 1014 01/22/21 0313 01/23/21 0348  Weight: 85 kg 86.4 kg 83.5 kg    Physical Examination: General:  Elderly appearing male in NAD, reclining comfortably in bed Neuro: alert, will converse, moves all 4 extremities HEENT:  [place]/AT, No JVD noted, PERRL Cardiovascular:  RRR, no MRG Lungs: ctab Abdomen:  Soft, non-distended Musculoskeletal:  No acute deformity Skin:  Intact, MMMP   Labs/imaging that I have personally reviewed: (right click and "Reselect all SmartList Selections" daily)   BUN/Cr 85->95->111/2.9->3->2.9 coox 68  Na 146  Resolved Hospital Problem list   Hemorrhagic shock Lactic acidosis  Assessment & Plan:   Acute hypoxemic respiratory failure  Aspiration of blood into airway. Epistaxis vs upper airway bleed.  Poor airway protection - extubated 5/4 doing well at this time.   -wean supplemental oxygen as able - Zosyn stopped 4/25  Acute inferior STEMI post PCI and stenting RCA, present on admission Cardiogenic shock, present on admission now resolved. Ventricular fibrillation, cardiac arrest Acute systolic heart failure, present on admission now resolved. - Holding isosorbide, hydralazine while hypotensive.  - Keep dual antiplatelet therapy aspirin plus Brilint, no further hemoptysis.  - Continue crestor per tube - HF team following -coox 68  AKI - appears euvolemic at this time Hypernatremia: recurring  - Nephrology following -  Free water flushes to increase again.  -BUN cont to rise and will eventually play role in mentation and ability to protect airway if cont to rise esp in setting of brain injury already with cva's. (although is rising slower) -passed swallow eval so hopefully intake will be adequate  enough and we can stop tf -  Anoxic brain injury- having some improvement on exam so giving more time Acute bilateral strokes, multiple microembolic infarctions on MRI Initial EEG negative, Twitching- no EEG correlate so thought less likely seizures - Supportive care - provigil Debilitation:  -profound weakness.  -will need extensive PT/OT  GOC - palliative following and appreciated.   Best practice (right click and "Reselect all SmartList Selections" daily)  Diet:  Tube Feed and po diet Pain/Anxiety/Delirium protocol (if indicated): No  VAP protocol (if indicated): Not indicated DVT prophylaxis: LMWH GI prophylaxis: PPI will add fiber for diarrhea. Glucose control:  SSI No and Basal insulin No Control is adequate.  Central venous access:  Yes, and it is still needed  Arterial line:  N/A Foley:  N/A  Mobility:  OOB PT consulted: N/A Last date of multidisciplinary goals of care discussion daily with POA 5/4 Code Status:  DNR Disposition: transfer to progressive  Critical care time: The patient is critically ill with multiple organ systems failure and requires high complexity decision making for assessment and support, frequent evaluation and titration of therapies, application of advanced monitoring technologies and extensive interpretation of multiple databases.  Critical care time 35 mins. This represents my time independent of the NPs time taking care of the pt. This is excluding procedures.    [facility] 01/23/2021, 8:09 AM  See Amion for pager If no response to pager, please call [phone] until 1900 After 1900 please call [facility] [phone]

## 2021-01-24 DIAGNOSIS — Z978 Presence of other specified devices: Secondary | ICD-10-CM | POA: Diagnosis not present

## 2021-01-24 DIAGNOSIS — N170 Acute kidney failure with tubular necrosis: Secondary | ICD-10-CM

## 2021-01-24 DIAGNOSIS — R570 Cardiogenic shock: Secondary | ICD-10-CM | POA: Diagnosis not present

## 2021-01-24 LAB — MAGNESIUM: Magnesium: 2.3 mg/dL (ref 1.7–2.4)

## 2021-01-24 LAB — CBC
HCT: 26.1 % — ABNORMAL LOW (ref 39.0–52.0)
Hemoglobin: 8.4 g/dL — ABNORMAL LOW (ref 13.0–17.0)
MCH: 31.2 pg (ref 26.0–34.0)
MCHC: 32.2 g/dL (ref 30.0–36.0)
MCV: 97 fL (ref 80.0–100.0)
Platelets: 387 10*3/uL (ref 150–400)
RBC: 2.69 MIL/uL — ABNORMAL LOW (ref 4.22–5.81)
RDW: 17.8 % — ABNORMAL HIGH (ref 11.5–15.5)
WBC: 22.1 10*3/uL — ABNORMAL HIGH (ref 4.0–10.5)
nRBC: 0 % (ref 0.0–0.2)

## 2021-01-24 LAB — COMPREHENSIVE METABOLIC PANEL
ALT: 130 U/L — ABNORMAL HIGH (ref 0–44)
AST: 69 U/L — ABNORMAL HIGH (ref 15–41)
Albumin: 2.5 g/dL — ABNORMAL LOW (ref 3.5–5.0)
Alkaline Phosphatase: 140 U/L — ABNORMAL HIGH (ref 38–126)
Anion gap: 10 (ref 5–15)
BUN: 111 mg/dL — ABNORMAL HIGH (ref 8–23)
CO2: 25 mmol/L (ref 22–32)
Calcium: 8.6 mg/dL — ABNORMAL LOW (ref 8.9–10.3)
Chloride: 111 mmol/L (ref 98–111)
Creatinine, Ser: 2.9 mg/dL — ABNORMAL HIGH (ref 0.61–1.24)
GFR, Estimated: 23 mL/min — ABNORMAL LOW (ref >60)
Glucose, Bld: 177 mg/dL — ABNORMAL HIGH (ref 70–99)
Potassium: 3.8 mmol/L (ref 3.5–5.1)
Sodium: 146 mmol/L — ABNORMAL HIGH (ref 135–145)
Total Bilirubin: 0.9 mg/dL (ref 0.3–1.2)
Total Protein: 6.5 g/dL (ref 6.5–8.1)

## 2021-01-24 LAB — GLUCOSE, CAPILLARY
Glucose-Capillary: 158 mg/dL — ABNORMAL HIGH (ref 70–99)
Glucose-Capillary: 132 mg/dL — ABNORMAL HIGH (ref 70–99)
Glucose-Capillary: 154 mg/dL — ABNORMAL HIGH (ref 70–99)
Glucose-Capillary: 141 mg/dL — ABNORMAL HIGH (ref 70–99)
Glucose-Capillary: 155 mg/dL — ABNORMAL HIGH (ref 70–99)

## 2021-01-24 LAB — COOXEMETRY PANEL
Carboxyhemoglobin: 1.8 % — ABNORMAL HIGH (ref 0.5–1.5)
Methemoglobin: 1.1 % (ref 0.0–1.5)
O2 Saturation: 68 %
Total hemoglobin: 8.5 g/dL — ABNORMAL LOW (ref 12.0–16.0)

## 2021-01-24 MED ORDER — POTASSIUM CHLORIDE 20 MEQ PO PACK: 40.0000 meq | PACK | Freq: Once | ORAL | Status: DC

## 2021-01-24 MED ORDER — ISOSORBIDE DINITRATE 10 MG PO TABS
5.0000 mg | ORAL_TABLET | Freq: Three times a day (TID) | ORAL | Status: DC
  Administered 2021-01-24 – 2021-01-29 (×16): 5 mg
  Filled 2021-01-24 (×16): qty 1

## 2021-01-24 MED ORDER — ASPIRIN 81 MG PO CHEW
81.0000 mg | CHEWABLE_TABLET | Freq: Every day | ORAL | Status: DC
  Administered 2021-01-24 – 2021-02-01 (×9): 81 mg
  Filled 2021-01-24 (×9): qty 1

## 2021-01-24 MED ORDER — HYDRALAZINE HCL 25 MG PO TABS
12.5000 mg | ORAL_TABLET | Freq: Three times a day (TID) | ORAL | Status: DC
Start: 2021-01-24 — End: 2021-01-24
  Filled 2021-01-24: qty 0.5

## 2021-01-24 MED ORDER — NUTRISOURCE FIBER PO PACK
1.0000 | PACK | Freq: Two times a day (BID) | ORAL | Status: DC
  Administered 2021-01-24 – 2021-01-31 (×13): 1 via ORAL
  Filled 2021-01-24 (×17): qty 1

## 2021-01-24 MED ORDER — TICAGRELOR 90 MG PO TABS
90.0000 mg | ORAL_TABLET | Freq: Two times a day (BID) | ORAL | Status: DC
  Administered 2021-01-24 – 2021-02-07 (×29): 90 mg
  Filled 2021-01-24 (×29): qty 1

## 2021-01-24 MED ORDER — FREE WATER
200.0000 mL | Status: DC
  Administered 2021-01-24 – 2021-01-25 (×6): 200 mL

## 2021-01-24 MED ORDER — POTASSIUM CHLORIDE 20 MEQ PO PACK
40.0000 meq | PACK | Freq: Once | ORAL | Status: AC
  Administered 2021-01-24: 40 meq
  Filled 2021-01-24: qty 2

## 2021-01-24 MED ORDER — MODAFINIL 100 MG PO TABS: 100.0000 mg | ORAL_TABLET | Freq: Every day | ORAL | Status: DC

## 2021-01-24 MED ORDER — POLYETHYLENE GLYCOL 3350 17 G PO PACK
17.0000 g | PACK | Freq: Two times a day (BID) | ORAL | Status: DC
  Administered 2021-01-26 – 2021-01-28 (×2): 17 g
  Filled 2021-01-24 (×3): qty 1

## 2021-01-24 MED ORDER — ROSUVASTATIN CALCIUM 5 MG PO TABS
10.0000 mg | ORAL_TABLET | Freq: Every day | ORAL | Status: DC
  Administered 2021-01-24 – 2021-02-01 (×9): 10 mg
  Filled 2021-01-24 (×9): qty 2

## 2021-01-24 MED ORDER — FUROSEMIDE 10 MG/ML IJ SOLN
80.0000 mg | Freq: Once | INTRAMUSCULAR | Status: AC
  Administered 2021-01-24: 80 mg via INTRAVENOUS
  Filled 2021-01-24: qty 8

## 2021-01-24 MED ORDER — MODAFINIL 100 MG PO TABS
100.0000 mg | ORAL_TABLET | Freq: Every day | ORAL | Status: DC
  Administered 2021-01-24 – 2021-02-01 (×9): 100 mg
  Filled 2021-01-24 (×9): qty 1

## 2021-01-24 MED ORDER — ROSUVASTATIN CALCIUM 5 MG PO TABS: 10.0000 mg | ORAL_TABLET | Freq: Every day | ORAL | Status: DC

## 2021-01-24 MED ORDER — ASPIRIN 81 MG PO CHEW: 81.0000 mg | CHEWABLE_TABLET | Freq: Every day | ORAL | Status: DC

## 2021-01-24 MED ORDER — HYDRALAZINE HCL 25 MG PO TABS
12.5000 mg | ORAL_TABLET | Freq: Three times a day (TID) | ORAL | Status: DC
  Administered 2021-01-24 – 2021-01-29 (×16): 12.5 mg
  Filled 2021-01-24 (×2): qty 1
  Filled 2021-01-24: qty 0.5
  Filled 2021-01-24 (×16): qty 1

## 2021-01-24 MED ORDER — DOCUSATE SODIUM 50 MG/5ML PO LIQD
100.0000 mg | Freq: Two times a day (BID) | ORAL | Status: DC
  Administered 2021-01-24 – 2021-01-28 (×3): 100 mg via ORAL
  Filled 2021-01-24 (×12): qty 10

## 2021-01-24 MED ORDER — POLYETHYLENE GLYCOL 3350 17 G PO PACK: 17.0000 g | PACK | Freq: Two times a day (BID) | ORAL | Status: DC

## 2021-01-24 MED ORDER — PANTOPRAZOLE SODIUM 40 MG PO PACK
40.0000 mg | PACK | Freq: Every day | ORAL | Status: DC
  Administered 2021-01-24 – 2021-02-01 (×8): 40 mg
  Filled 2021-01-24 (×12): qty 20

## 2021-01-24 MED ORDER — TICAGRELOR 90 MG PO TABS
90.0000 mg | ORAL_TABLET | Freq: Two times a day (BID) | ORAL | Status: DC
  Filled 2021-01-24: qty 1

## 2021-01-24 MED ORDER — PANTOPRAZOLE SODIUM 40 MG PO TBEC: 40.0000 mg | DELAYED_RELEASE_TABLET | Freq: Every day | ORAL | Status: DC

## 2021-01-24 MED ORDER — ENOXAPARIN SODIUM 30 MG/0.3ML IJ SOSY
30.0000 mg | PREFILLED_SYRINGE | INTRAMUSCULAR | Status: DC
  Administered 2021-01-24 – 2021-01-28 (×5): 30 mg via SUBCUTANEOUS
  Filled 2021-01-24 (×5): qty 0.3

## 2021-01-24 NOTE — Progress Notes (Signed)
Call placed to primary MD re:  Patient's overall decline in neurological status.  Pt is able to open his eyes and track me, however he is not able to follow commands.  Cannot take po at this time as he is not able to mobilize his secretions or swallow H2O.  All meds to be given via Cortrak.  Pt is not able to move extremities to command, but does have very weak BUE hand grasps.  Only moved his legs reflexively.  Not able to sit on EOB.  MD to inform pt's POA re:  Change in status.

## 2021-01-24 NOTE — Progress Notes (Signed)
Patient ID: [name], male   DOB: Dec 13, 1950, 70 y.o.   MRN: [id number]     Advanced Heart Failure Rounding Note  PCP-Cardiologist: None   Subjective:    4/20 Impella removed. MRI brain with numerous micro embolic infarctions. Off pressors.  4/21 lasix drip stopped.  4/22 started CVVH 4/24 CVVH stopped. Failed extubation. Re-intubated.  4/26 Extubated  5/1 Re-intubated 5/4 Extubated   Extubated yesterday. Off pressors. Co-ox 68%   Good UOP w/ IV Lasix. -3.3L in UOP yesterday. Scr 2.9 today which is stable but BUN trending up 111 K 3.8  Na 146   Febrile this am, temp 101.2. WBC 22K   BP better w/ Hydral + Imdur   Awake. Nods to questions.   Objective:   Weight Range: 83.5 kg Body mass index is 24.29 kg/m.   Vital Signs:   Temp:  [98.3 F (36.8 C)-101.2 F (38.4 C)] 99.1 F (37.3 C) (05/05 1200) Pulse Rate:  [86-114] 101 (05/05 1200) Resp:  [13-29] 25 (05/05 1200) BP: (116-147)/(61-85) 124/65 (05/05 1200) SpO2:  [93 %-100 %] 94 % (05/05 1200) Last BM Date: 01/24/21  Weight change: Filed Weights   01/19/21 1014 01/22/21 0313 01/23/21 0348  Weight: 85 kg 86.4 kg 83.5 kg    Intake/Output:   Intake/Output Summary (Last 24 hours) at 01/24/2021 1328 Last data filed at 01/24/2021 1100 Gross per 24 hour  Intake 2220 ml  Output 3100 ml  Net -880 ml      Physical Exam   General: Fatigued appearing elderly WM now extubated,  no distress  HEENT: normal + cor-trak  Neck: supple. + LIJ HD cath  . Carotids 2+ bilat; no bruits. No lymphadenopathy or thryomegaly appreciated. Cor: PMI nondisplaced. Regular rhythm, mildly tachy rate. No rubs, gallops or murmurs. Lungs: clear Abdomen: soft, nontender, nondistended. No hepatosplenomegaly. No bruits or masses. Good bowel sounds. Extremities: no cyanosis, clubbing, rash, no edema, bilateral feet cool to touch  Neuro: awake and alert, intermittently following commands   Telemetry   NSR mild sinus tach 90s-low 100s  Personally reviewed  Labs    CBC Recent Labs    01/24/21 0359  WBC 22.1*  HGB 8.4*  HCT 26.1*  MCV 97.0  PLT 854   Basic Metabolic Panel Recent Labs    01/23/21 0405 01/24/21 0359  NA 144 146*  K 3.5 3.8  CL 110 111  CO2 25 25  GLUCOSE 158* 177*  BUN 102* 111*  CREATININE 3.00* 2.90*  CALCIUM 8.4* 8.6*  MG 2.4 2.3   Liver Function Tests Recent Labs    01/23/21 0405 01/24/21 0359  AST 65* 69*  ALT 104* 130*  ALKPHOS 121 140*  BILITOT 0.9 0.9  PROT 6.5 6.5  ALBUMIN 2.5* 2.5*   No results for input(s): LIPASE, AMYLASE in the last 72 hours. Cardiac Enzymes No results for input(s): CKTOTAL, CKMB, CKMBINDEX, TROPONINI in the last 72 hours.  BNP: BNP (last 3 results) No results for input(s): BNP in the last 8760 hours.  ProBNP (last 3 results) No results for input(s): PROBNP in the last 8760 hours.   D-Dimer No results for input(s): DDIMER in the last 72 hours. Hemoglobin A1C No results for input(s): HGBA1C in the last 72 hours. Fasting Lipid Panel No results for input(s): CHOL, HDL, LDLCALC, TRIG, CHOLHDL, LDLDIRECT in the last 72 hours. Thyroid Function Tests No results for input(s): TSH, T4TOTAL, T3FREE, THYROIDAB in the last 72 hours.  Invalid input(s): FREET3  Other results:   Imaging  No results found.   Medications:     Scheduled Medications: . aspirin  81 mg Per Tube Daily  . chlorhexidine gluconate (MEDLINE KIT)  15 mL Mouth Rinse BID  . Chlorhexidine Gluconate Cloth  6 each Topical Daily  . docusate  100 mg Oral BID  . enoxaparin (LOVENOX) injection  30 mg Subcutaneous Q24H  . feeding supplement (PROSource TF)  90 mL Per Tube BID  . fiber  1 packet Oral BID  . free water  200 mL Per Tube Q4H  . hydrALAZINE  12.5 mg Per Tube Q8H  . insulin aspart  0-9 Units Subcutaneous Q4H  . isosorbide dinitrate  5 mg Per Tube TID  . mouth rinse  15 mL Mouth Rinse q12n4p  . modafinil  100 mg Per Tube Daily  . pantoprazole sodium  40 mg  Per Tube Daily  . polyethylene glycol  17 g Per Tube BID  . rosuvastatin  10 mg Per Tube Daily  . ticagrelor  90 mg Per Tube BID    Infusions: . feeding supplement (OSMOLITE 1.5 CAL) 1,000 mL (01/24/21 0500)    PRN Medications: acetaminophen (TYLENOL) oral liquid 160 mg/5 mL, atropine, ipratropium-albuterol, ondansetron (ZOFRAN) IV, sodium chloride flush    Assessment/Plan   1. CAD/ Acute Inferior STEMI - emergent cath w/ pRCA occlusion s/p PCI + DES - Continue DAPT/statin - no s/s angina  2. VF Arrest - in setting of acute MI, now s/p revascularization  - rhythm stable.  - appears to have significant anoxic brain injury but improved somewhat over last 24-48 hours  3. Acute Systolic Heart Failure>>Cardiogenic Shock  - ICM Echo LVEF 20-25%, RV moderately reduced - Impella out 4/20. Off all pressors. Co-ox 68% - CVVH stopped 4/24. Volume status ok. Continue IV Lasix 80 mg x 1 today.  - No b-blocker, ARNI, MRA with AKI  - C/w Hydral and nitrates. BP stable   4. Acute hypoxic respiratory failure - 4/24 failed extubation trial.  - 5/1 reintubated due to inability to handle secretions/hemoptysis - 5/4 extubated   5. Nonoliguric AKI - due to ATN/shock - CVVH begun 4/22 and stopped 4/24. Had one session of iHD on 4/30 for clearance - SCr seems to be leveling off ~3. BUN 111 - Now watchful waiting. Not candidate for outpatient HD - d/w nephrology at bedside, will continue IV Lasix 80 mg x 1 today. No indication for HD today but keep HD cath in place.   6. Anoxic brain injury  - EEG w/ generalized periodic discharges with triphasic morphology + moderate to severe diffuse encephalopathy.  - MRI- numerous micro embolic infarctions . No hemorrhage or mass.  - CIR has turned down due to poor rehab potential. Recommending SNF - He appears to have significant anoxic injury but is clearly following commands better today - I had discussion with his HCPOA, [name] earlier this  week Given MSOF and evidence of significant anoxic brain injury, I am concerned that he may not be able  achieve meaningful outcome that he would tolerate given his pre-MI condition - Given progress over last 24-48 hours, I would advocate for ongoing care. C/w [place] discussions. Palliative following   7. ID:  - temp 101.2, WBC 22 K (stable) - will d/w PCCM re-initiation of abx    Length of Stay: 21  Glori Bickers, MD  1:28 PM   Patient seen and examined with the above-signed Advanced Practice Provider and/or Housestaff. I personally reviewed laboratory data, imaging studies and relevant  notes. I independently examined the patient and formulated the important aspects of the plan. I have edited the note to reflect any of my changes or salient points. I have personally discussed the plan with the patient and/or family.  Extubated yesterday. Sitting up in bed. Awake and will nod but nonverbal. Will follow occasional commands but very weak.  BUN continues to climb  General:  Weak appearing. No resp difficulty HEENT: normal + cortrak Neck: supple. no JVD. LIJ HD cath Carotids 2+ bilat; no bruits. No lymphadenopathy or thryomegaly appreciated. Cor: PMI nondisplaced. Regular rate & rhythm. No rubs, gallops or murmurs. Lungs: clear Abdomen: soft, nontender, nondistended. No hepatosplenomegaly. No bruits or masses. Good bowel sounds. Extremities: no cyanosis, clubbing, rash, edema Neuro: Awake and will nod but nonverbal. Will follow occasional commands but very weak. Unable to move RUE  Unfortunately his progress has stalled and now that he is extubated it seems clear that a meaningful recovery that he would be happy with is probably not possible. Agree with supportive care but no escalation (no further HD, intubation or CPR). Will continue to support for now.   Glori Bickers, MD  1:32 PM

## 2021-01-24 NOTE — Progress Notes (Signed)
Physical Therapy Treatment Patient Details Name: [name] MRN: [id number] DOB: 1950/11/07 Today's Date: 01/24/2021    History of Present Illness Pt is 70 yo admitted 4/14 with chest pain and STEMI with bradycardia and hypotension. Pt taken for urgent cath with temporary pacer placed but pt decompensated to cardiac arrest with intubation and CPR. Stent placed and pt with further complication of cardiogenic shock with Impella placed. 4/18 seizure like activity, Impella removed 4/20, 4/20 MRI with scattered micro embolic infarctions. Extubated 4/24 and reintubated 2 hrs later. Extubated 4/26. Reintubated 01/20/21. Extubated 01/23/21.  PMHx: HTN, HLD, CAD    PT Comments    Pt admitted with above diagnosis. Pt was not following commands todayand much less responsive than yesterday.  Was able to get pt to EOB and pt did sit with mod to min assist as well as stood with max assist of 2 however pt functioning at lower level overall today and not talking like he did yesterday.  Will continue to follow acutely.  Pt currently with functional limitations due to balance and endurance deficits. Pt will benefit from skilled PT to increase their independence and safety with mobility to allow discharge to the venue listed below.     Follow Up Recommendations  Supervision/Assistance - 24 hour;SNF     Equipment Recommendations  Wheelchair cushion (measurements PT);3in1 (PT);Wheelchair (measurements PT);Hospital bed    Recommendations for Other Services       Precautions / Restrictions Precautions Precautions: Fall Precaution Comments: globally aphasic, cortrak, flexiseal Restrictions Weight Bearing Restrictions: No    Mobility  Bed Mobility Overal bed mobility: Needs Assistance Bed Mobility: Rolling;Supine to Sit;Sit to Supine Rolling: Total assist;+2 for physical assistance   Supine to sit: Max assist;+2 for physical assistance;HOB elevated Sit to supine: Total assist;+2 for physical assistance    General bed mobility comments: Pt not assisting to commands today.  Total assist to bend LEs and move UEs withpt needing max assist of 2 to come to EOB.    Transfers Overall transfer level: Needs assistance Equipment used: 2 person hand held assist Transfers: Sit to/from Stand Sit to Stand: +2 physical assistance;From elevated surface;Max assist         General transfer comment: Pt was able to stand with +2 max assist with pt only lifting buttocks about 10 inches off of bed with 2 attempts. Scooted pt to [facility] withuse of pad to assist. PT/OT  blocking knees for safety.  Pt then assisted back to bed as above.  Ambulation/Gait             General Gait Details: unable   Stairs             Wheelchair Mobility    Modified Rankin (Stroke Patients Only) Modified Rankin (Stroke Patients Only) Pre-Morbid Rankin Score: No symptoms Modified Rankin: Severe disability     Balance Overall balance assessment: Needs assistance Sitting-balance support: Feet supported;No upper extremity supported Sitting balance-Leahy Scale: Fair Sitting balance - Comments: Pt was able to sit EOB with mod physical assist with UEs with pt pushing with left hand at time.  Pt progressed to sitting with min assist at times but mod assist with posterior lean as he fatigued.   Standing balance support: Bilateral upper extremity supported Standing balance-Leahy Scale: Poor Standing balance comment: relies on bil UE support and support blocking knees to stand statically. Also with +2 max assist by PT/OT  Cognition Arousal/Alertness: Awake/alert Behavior During Therapy: Flat affect Overall Cognitive Status: Difficult to assess Area of Impairment: Orientation;Attention;Memory;Following commands;Awareness;Problem solving                 Orientation Level: Situation;Time;Place Current Attention Level: Focused Memory: Decreased recall of precautions;Decreased  short-term memory Following Commands: Follows one step commands inconsistently;Follows one step commands with increased time   Awareness: Intellectual Problem Solving: Slow processing;Decreased initiation;Difficulty sequencing;Requires verbal cues;Requires tactile cues General Comments: Pt not following any commands today. Did shake head to yes no questions although questionable reliability.  No audible verbalizations today which differed from yesterday.      Exercises Other Exercises Other Exercises: gentle rotation and lateral flexion PROM of neck, also rotation bil directions of trunk in sitting and while in bed    General Comments General comments (skin integrity, edema, etc.): VSS with pt on 2LO2 on arrival at 96%, removed O2 and left on RA with nurse in agreement      Pertinent Vitals/Pain Pain Assessment: Faces Faces Pain Scale: Hurts whole lot Pain Location: generalized, neck Pain Descriptors / Indicators: Grimacing Pain Intervention(s): Limited activity within patient's tolerance;Monitored during session;Repositioned    Home Living                      Prior Function            PT Goals (current goals can now be found in the care plan section) Acute Rehab PT Goals Patient Stated Goal: unable Progress towards PT goals: Not progressing toward goals - comment (Pt less responsive today and needed more assist overall)    Frequency    Min 3X/week      PT Plan Current plan remains appropriate;Frequency needs to be updated    Co-evaluation PT/OT/SLP Co-Evaluation/Treatment: Yes Reason for Co-Treatment: Complexity of the patient's impairments (multi-system involvement);For patient/therapist safety PT goals addressed during session: Mobility/safety with mobility        AM-PAC PT "6 Clicks" Mobility   Outcome Measure  Help needed turning from your back to your side while in a flat bed without using bedrails?: Total Help needed moving from lying on your  back to sitting on the side of a flat bed without using bedrails?: Total Help needed moving to and from a bed to a chair (including a wheelchair)?: Total Help needed standing up from a chair using your arms (e.g., wheelchair or bedside chair)?: Total Help needed to walk in hospital room?: Total Help needed climbing 3-5 steps with a railing? : Total 6 Click Score: 6    End of Session Equipment Utilized During Treatment: Gait belt;Oxygen Activity Tolerance: Patient limited by fatigue Patient left: with call bell/phone within reach;in bed;with bed alarm set (in chair position) Nurse Communication: Mobility status PT Visit Diagnosis: Unsteadiness on feet (R26.81);Muscle weakness (generalized) (M62.81);Hemiplegia and hemiparesis Hemiplegia - Right/Left: Right Hemiplegia - dominant/non-dominant: Non-dominant Hemiplegia - caused by: Cerebral infarction     Time: 0911-0945 PT Time Calculation (min) (ACUTE ONLY): 34 min  Charges:  $Therapeutic Activity: 8-22 mins                     Joeseph Verville M,PT Acute Rehab Services [phone] (pager)   Alvira Philips 01/24/2021, 10:00 AM

## 2021-01-24 NOTE — Progress Notes (Signed)
Patient ID: [name], male   DOB: 12-18-1950, 70 y.o.   MRN: [id number]     Advanced Heart Failure Rounding Note  PCP-Cardiologist: None   Subjective:    4/20 Impella removed. MRI brain with numerous micro embolic infarctions. Off pressors.  4/21 lasix drip stopped.  4/22 started CVVH 4/24 CVVH stopped. Failed extubation. Re-intubated.  4/26 Extubated  5/1 Re-intubated 5/4 Extubated   Extubated yesterday. Off pressors. Co-ox 68%   Good UOP w/ IV Lasix. -3.3L in UOP yesterday. Scr 2.9 today which is stable but BUN trending up 111 K 3.8  Na 146   Febrile this am, temp 101.2. WBC 22K   BP better w/ Hydral + Imdur   Awake. Nods to questions.   Objective:   Weight Range: 83.5 kg Body mass index is 24.29 kg/m.   Vital Signs:   Temp:  [98.3 F (36.8 C)-101.2 F (38.4 C)] 101.2 F (38.4 C) (05/05 0743) Pulse Rate:  [86-114] 99 (05/05 0600) Resp:  [13-29] 26 (05/05 0600) BP: (128-148)/(61-85) 129/61 (05/05 0600) SpO2:  [94 %-100 %] 100 % (05/05 0600) Last BM Date: 01/24/21  Weight change: Filed Weights   01/19/21 1014 01/22/21 0313 01/23/21 0348  Weight: 85 kg 86.4 kg 83.5 kg    Intake/Output:   Intake/Output Summary (Last 24 hours) at 01/24/2021 0820 Last data filed at 01/24/2021 0600 Gross per 24 hour  Intake 2360 ml  Output 2925 ml  Net -565 ml      Physical Exam   General: Fatigued appearing elderly WM now extubated,  no distress  HEENT: normal + cor-trak  Neck: supple. + LIJ HD cath  . Carotids 2+ bilat; no bruits. No lymphadenopathy or thryomegaly appreciated. Cor: PMI nondisplaced. Regular rhythm, mildly tachy rate. No rubs, gallops or murmurs. Lungs: clear Abdomen: soft, nontender, nondistended. No hepatosplenomegaly. No bruits or masses. Good bowel sounds. Extremities: no cyanosis, clubbing, rash, no edema, bilateral feet cool to touch  Neuro: awake and alert, intermittently following commands   Telemetry   NSR mild sinus tach 90s-low 100s  Personally reviewed  Labs    CBC Recent Labs    01/24/21 0359  WBC 22.1*  HGB 8.4*  HCT 26.1*  MCV 97.0  PLT 616   Basic Metabolic Panel Recent Labs    01/23/21 0405 01/24/21 0359  NA 144 146*  K 3.5 3.8  CL 110 111  CO2 25 25  GLUCOSE 158* 177*  BUN 102* 111*  CREATININE 3.00* 2.90*  CALCIUM 8.4* 8.6*  MG 2.4 2.3   Liver Function Tests Recent Labs    01/23/21 0405 01/24/21 0359  AST 65* 69*  ALT 104* 130*  ALKPHOS 121 140*  BILITOT 0.9 0.9  PROT 6.5 6.5  ALBUMIN 2.5* 2.5*   No results for input(s): LIPASE, AMYLASE in the last 72 hours. Cardiac Enzymes No results for input(s): CKTOTAL, CKMB, CKMBINDEX, TROPONINI in the last 72 hours.  BNP: BNP (last 3 results) No results for input(s): BNP in the last 8760 hours.  ProBNP (last 3 results) No results for input(s): PROBNP in the last 8760 hours.   D-Dimer No results for input(s): DDIMER in the last 72 hours. Hemoglobin A1C No results for input(s): HGBA1C in the last 72 hours. Fasting Lipid Panel No results for input(s): CHOL, HDL, LDLCALC, TRIG, CHOLHDL, LDLDIRECT in the last 72 hours. Thyroid Function Tests No results for input(s): TSH, T4TOTAL, T3FREE, THYROIDAB in the last 72 hours.  Invalid input(s): FREET3  Other results:   Imaging  No results found.   Medications:     Scheduled Medications: . aspirin  81 mg Per Tube Daily  . chlorhexidine gluconate (MEDLINE KIT)  15 mL Mouth Rinse BID  . Chlorhexidine Gluconate Cloth  6 each Topical Daily  . docusate  100 mg Per Tube BID  . feeding supplement (PROSource TF)  90 mL Per Tube BID  . fiber  1 packet Per Tube BID  . free water  200 mL Per Tube Q6H  . hydrALAZINE  12.5 mg Per Tube Q8H  . insulin aspart  0-9 Units Subcutaneous Q4H  . isosorbide mononitrate  15 mg Oral Daily  . mouth rinse  15 mL Mouth Rinse q12n4p  . modafinil  100 mg Per Tube Daily  . pantoprazole sodium  40 mg Per Tube Daily  . polyethylene glycol  17 g Per Tube  BID  . rosuvastatin  10 mg Per Tube Daily  . ticagrelor  90 mg Per Tube BID    Infusions: . feeding supplement (OSMOLITE 1.5 CAL) 1,000 mL (01/24/21 0500)    PRN Medications: acetaminophen (TYLENOL) oral liquid 160 mg/5 mL, atropine, ipratropium-albuterol, ondansetron (ZOFRAN) IV, sodium chloride flush    Assessment/Plan   1. CAD/ Acute Inferior STEMI - emergent cath w/ pRCA occlusion s/p PCI + DES - Continue DAPT/statin - no s/s angina  2. VF Arrest - in setting of acute MI, now s/p revascularization  - rhythm stable.  - appears to have significant anoxic brain injury but improved somewhat over last 24-48 hours  3. Acute Systolic Heart Failure>>Cardiogenic Shock  - ICM Echo LVEF 20-25%, RV moderately reduced - Impella out 4/20. Off all pressors. Co-ox 68% - CVVH stopped 4/24. Volume status ok. Continue IV Lasix 80 mg x 1 today.  - No b-blocker, ARNI, MRA with AKI  - C/w Hydral and nitrates. BP stable   4. Acute hypoxic respiratory failure - 4/24 failed extubation trial.  - 5/1 reintubated due to inability to handle secretions/hemoptysis - 5/4 extubated   5. Nonoliguric AKI - due to ATN/shock - CVVH begun 4/22 and stopped 4/24. Had one session of iHD on 4/30 for clearance - SCr seems to be leveling off ~3. BUN 111 - Now watchful waiting. Not candidate for outpatient HD - d/w nephrology at bedside, will continue IV Lasix 80 mg x 1 today. No indication for HD today but keep HD cath in place.   6. Anoxic brain injury  - EEG w/ generalized periodic discharges with triphasic morphology + moderate to severe diffuse encephalopathy.  - MRI- numerous micro embolic infarctions . No hemorrhage or mass.  - CIR has turned down due to poor rehab potential. Recommending SNF - He appears to have significant anoxic injury but is clearly following commands better today - I had discussion with his HCPOA, [name] earlier this week Given MSOF and evidence of significant anoxic brain  injury, I am concerned that he may not be able  achieve meaningful outcome that he would tolerate given his pre-MI condition - Given progress over last 24-48 hours, I would advocate for ongoing care. C/w [place] discussions. Palliative following   7. ID:  - temp 101.2, WBC 22 K (stable) - will d/w PCCM re-initiation of abx    Length of Stay: [place], PA-C  8:20 AM

## 2021-01-24 NOTE — Progress Notes (Signed)
PCCM >> TRH service for 01/25/2021.  HPI: "70 year old male with PMHx HTN, HLD, CAD admitted as a STEMI who suffered cardiac arrest in hospital prior to emergent cardiac cath. PCI with stent to proximal RCA. EF 25-30%. Prolonged ICU stay for cardiogenic/hemorrhagic shock requiring vasopressors and transfusions. Mental status did not greatly improve as his hemodynamics did. MRI showed scattered punctate infarctions concerning for anoxic injury. AKI progressed to require CRRT, he has since been transitioned to [facility]. Extubated 4/26. Did not pass swallow due to aspiration risk. 4/30 he developed worsening hypoxia and developed hemoptysis with multiple large clots being expectorated."  Significant Events: 4/20 Impella removed. MRI brain with numerous micro embolic infarctions. Off pressors.  4/21 lasix drip stopped.  4/22 started CVVH 4/24 CVVH stopped. Failed extubation. Re-intubated.  4/26 Extubated  5/1 Re-intubated 5/4 Extubated, off pressors   Patient's mental status remains altered.  Can follow simple commands, disorientation waxes and wanes.  Stable for transfer to [facility] service tomorrow.     No charge

## 2021-01-24 NOTE — Progress Notes (Signed)
Pt nodding head yes/no, but not following commands or verbalizing today. Indicates pain/discomfort with grimacing with gentle neck ROM. Sat at EOB with facilitation of shoulder retraction, trunk and neck extension with mod assist. Attempted standing with +2 max assist, pt able to clear hips, but with significant flexion in hip, knees and trunk. Pt is now extubated and moving to progressive care unit. Updated d/c disposition to SNF.    01/24/21 1011  OT Visit Information  Last OT Received On 01/24/21  Assistance Needed +2  PT/OT/SLP Co-Evaluation/Treatment Yes  Reason for Co-Treatment Complexity of the patient's impairments (multi-system involvement);For patient/therapist safety  OT goals addressed during session Strengthening/ROM  History of Present Illness Pt is 70 yo admitted 4/14 with chest pain and STEMI with bradycardia and hypotension. Pt taken for urgent cath with temporary pacer placed but pt decompensated to cardiac arrest with intubation and CPR. Stent placed and pt with further complication of cardiogenic shock with Impella placed. 4/18 seizure like activity, Impella removed 4/20, 4/20 MRI with scattered micro embolic infarctions. Extubated 4/24 and reintubated 2 hrs later. Extubated 4/26. Reintubated 01/20/21. Extubated 01/23/21.  PMHx: HTN, HLD, CAD  Precautions  Precautions Fall  Precaution Comments globally aphasic, cortrak, flexiseal  Pain Assessment  Pain Assessment Faces  Faces Pain Scale 8  Pain Location generalized, neck  Cognition  Arousal/Alertness Awake/alert  Behavior During Therapy Flat affect  Overall Cognitive Status Difficult to assess  Problem Solving Slow processing;Decreased initiation  General Comments Pt not following any commands today. Did shake head to yes no questions although questionable reliability.  No audible verbalizations today which differed from yesterday.  ADL  Overall ADL's  Needs assistance/impaired  Grooming Total assistance;Sitting  Lower Body  Dressing Total assistance;Bed level  Bed Mobility  Overal bed mobility Needs Assistance  Bed Mobility Rolling;Supine to Sit;Sit to Supine  Rolling Total assist;+2 for physical assistance  Supine to sit Max assist;+2 for physical assistance;HOB elevated  Sit to supine Total assist;+2 for physical assistance  General bed mobility comments Pt not assisting to commands today.  Total assist to bend LEs and move UEs withpt needing max assist of 2 to come to EOB.  Balance  Overall balance assessment Needs assistance  Sitting balance-Leahy Scale Poor  Sitting balance - Comments Pt was able to sit EOB with mod physical assist with UEs with pt pushing with left hand at time.  Pt progressed to sitting with min assist at times but mod assist with posterior lean as he fatigued.  Standing balance support Bilateral upper extremity supported  Standing balance-Leahy Scale Zero  Transfers  Overall transfer level Needs assistance  Equipment used 2 person hand held assist  Transfers Sit to/from Stand  Sit to Stand +2 physical assistance;From elevated surface;Max assist  General transfer comment Pt was able to stand with +2 max assist with pt only lifting buttocks about 10 inches off of bed with 2 attempts. Scooted pt to [facility] withuse of pad to assist. PT/OT  blocking knees for safety.  Pt then assisted back to bed as above.  Other Exercises  Other Exercises gentle rotation and lateral flexion PROM of neck, also rotation bil directions of trunk in sitting and while in bed  OT - End of Session  Equipment Utilized During Treatment Gait belt  Activity Tolerance Patient tolerated treatment well  Patient left in bed;with call bell/phone within reach;with bed alarm set;with nursing/sitter in room  OT Assessment/Plan  OT Plan Discharge plan needs to be updated  OT Visit Diagnosis Cognitive  communication deficit (R41.841);Muscle weakness (generalized) (M62.81);Other abnormalities of gait and mobility (R26.89)   Symptoms and signs involving cognitive functions Cerebral infarction  Hemiplegia - Right/Left Right  Hemiplegia - dominant/non-dominant Dominant  Hemiplegia - caused by Cerebral infarction  OT Frequency (ACUTE ONLY) Min 2X/week  Follow Up Recommendations SNF;Supervision/Assistance - 24 hour  [facility] bed;Wheelchair cushion (measurements OT);Wheelchair (measurements OT);3 in 1 bedside commode (lift equipment)  AM-PAC OT "6 Clicks" Daily Activity Outcome Measure (Version 2)  Help from another person eating meals? 1  Help from another person taking care of personal grooming? 1  Help from another person toileting, which includes using toliet, bedpan, or urinal? 1  Help from another person bathing (including washing, rinsing, drying)? 1  Help from another person to put on and taking off regular upper body clothing? 1  Help from another person to put on and taking off regular lower body clothing? 1  6 Click Score 6  OT Goal Progression  Progress towards OT goals Progressing toward goals  Acute Rehab OT Goals  Patient Stated Goal unable  OT Goal Formulation Patient unable to participate in goal setting  Time For Goal Achievement 01/30/21  Potential to Achieve Goals Fair  OT Time Calculation  OT Start Time (ACUTE ONLY) 0910  OT Stop Time (ACUTE ONLY) 0944  OT Time Calculation (min) 34 min  OT General Charges  $OT Visit 1 Visit  OT Treatments  $Therapeutic Activity 8-22 mins  Nestor Lewandowsky, OTR/L Acute Rehabilitation Services Pager: [phone] Office: [phone]

## 2021-01-24 NOTE — Progress Notes (Signed)
[facility] NEPHROLOGY PROGRESS NOTE  Assessment/ Plan: Pt is a 70 y.o. yo male with history of HTN, HLD, CAD presented with chest pain found to have STEMI.  He underwent left heart cath, placement of Impella device, had V. fib arrest, mechanical ventilation for respiratory failure and AKI.  #Acute kidney injury due to cardiogenic/hemorrhagic shock.  UA bland and kidney ultrasound unremarkable.  Required CRRT from 4/22-4/24.  Patient is nonoliguric and volume status acceptable.  Received intermittent hemodialysis on 4/30 for clearance.  No indications for HD today, daily reassessment, still too tenuous for HD cath removal.  Azotemia is multifactorial: low GFR, diuresis, enteral protein intake; Repeat bolus lasix dose today.    #Hypernatremia due to increase free water excretion and recovering AKI.  Stable today.   #CAD/acute STEMI required emergent cath with stent placement.  EF 20 to 25%.  AHF following.  #V. fib arrest/cardiogenic shock, acute systolic CHF: Arrest in the setting of acute MI.  Impella was removed.  Now off of pressors or inotropes.  Volume status is acceptable.  #Ventilator dependent respiratory failure: Extubated 01/23/21  #Anemia due to acute blood loss: Required multiple blood transfusion.  Transfuse as needed.  #Hypokalemia: Potassium level improved.  #Encephalopathy: Monitor  Subjective:   Extubated yesterday  Good UOP on single dose bolus lasix  SCr stable 2.9, BUN 111, K 3.8  Awake, head gestures to basic questions,  Has L IJ Temp HD cath present  Objective Vital signs in last 24 hours: Vitals:   01/24/21 0743 01/24/21 0800 01/24/21 0900 01/24/21 1000  BP:  136/69 139/69 130/65  Pulse:  (!) 104 (!) 108 (!) 101  Resp:  (!) 28 (!) 27 (!) 28  Temp: (!) 101.2 F (38.4 C)     TempSrc: Axillary     SpO2:  95% 95% 93%  Weight:      Height:       Weight change:   Intake/Output Summary (Last 24 hours) at 01/24/2021 1034 Last data filed at  01/24/2021 1000 Gross per 24 hour  Intake 2600 ml  Output 3450 ml  Net -850 ml       Labs: Basic Metabolic Panel: Recent Labs  Lab 01/20/21 0122 01/20/21 0140 01/22/21 0337 01/23/21 0405 01/24/21 0359  NA 141   < > 142 144 146*  K 4.7   < > 3.7 3.5 3.8  CL 103   < > 107 110 111  CO2 26   < > 24 25 25   GLUCOSE 119*   < > 153* 158* 177*  BUN 62*   < > 94* 102* 111*  CREATININE 2.46*   < > 2.99* 3.00* 2.90*  CALCIUM 8.1*   < > 8.2* 8.4* 8.6*  PHOS 6.2*  --   --   --   --    < > = values in this interval not displayed.   Liver Function Tests: Recent Labs  Lab 01/22/21 0337 01/23/21 0405 01/24/21 0359  AST 36 65* 69*  ALT 70* 104* 130*  ALKPHOS 108 121 140*  BILITOT 0.7 0.9 0.9  PROT 6.3* 6.5 6.5  ALBUMIN 2.3* 2.5* 2.5*   No results for input(s): LIPASE, AMYLASE in the last 168 hours. Recent Labs  Lab 01/20/21 0518  AMMONIA 27   CBC: Recent Labs  Lab 01/19/21 0626 01/20/21 0122 01/20/21 0140 01/21/21 0346 01/24/21 0359  WBC 12.7* 24.2*  --  22.0* 22.1*  NEUTROABS  --   --   --  19.9*  --  HGB 8.8* 8.2* 7.8* 8.0* 8.4*  HCT 27.5* 24.7* 23.0* 24.8* 26.1*  MCV 97.2 99.2  --  96.5 97.0  PLT 478* 451*  --  391 387   Cardiac Enzymes: No results for input(s): CKTOTAL, CKMB, CKMBINDEX, TROPONINI in the last 168 hours. CBG: Recent Labs  Lab 01/23/21 1531 01/23/21 1920 01/23/21 2330 01/24/21 0337 01/24/21 0724  GLUCAP 114* 121* 114* 158* 132*    Iron Studies:  No results for input(s): IRON, TIBC, TRANSFERRIN, FERRITIN in the last 72 hours. Studies/Results: No results found.  Medications: Infusions: . feeding supplement (OSMOLITE 1.5 CAL) 1,000 mL (01/24/21 0500)    Scheduled Medications: . aspirin  81 mg Per Tube Daily  . chlorhexidine gluconate (MEDLINE KIT)  15 mL Mouth Rinse BID  . Chlorhexidine Gluconate Cloth  6 each Topical Daily  . docusate  100 mg Oral BID  . enoxaparin (LOVENOX) injection  30 mg Subcutaneous Q24H  . feeding  supplement (PROSource TF)  90 mL Per Tube BID  . fiber  1 packet Oral BID  . free water  200 mL Per Tube Q4H  . hydrALAZINE  12.5 mg Per Tube Q8H  . insulin aspart  0-9 Units Subcutaneous Q4H  . isosorbide dinitrate  5 mg Per Tube TID  . mouth rinse  15 mL Mouth Rinse q12n4p  . modafinil  100 mg Per Tube Daily  . pantoprazole sodium  40 mg Per Tube Daily  . polyethylene glycol  17 g Per Tube BID  . rosuvastatin  10 mg Per Tube Daily  . ticagrelor  90 mg Per Tube BID    have reviewed scheduled and prn medications.  Physical Exam: General: Critically ill looking male , on [place], eyes open, nods to questions Heart:RRR, s1s2 nl, no rubs Lungs: Coarse breath sound bilateral Abdomen:soft,  non-distended Extremities:No edema Dialysis Access: L IJ temporary HD catheter  Keath Matera B Kristjan Derner 01/24/2021,10:34 AM  LOS: 21 days

## 2021-01-24 NOTE — Progress Notes (Signed)
Nutrition Follow-up  DOCUMENTATION CODES:   Not applicable  INTERVENTION:   Continue TF via Cortrak: Osmolite 1.5 at 60 ml/h (1440 ml per day) Prosource TF decrease to 45 ml BID  Provides 2240 kcal, 112 gm protein, 1094 ml free water daily.  NUTRITION DIAGNOSIS:   Inadequate oral intake related to acute illness as evidenced by NPO status.  Ongoing   GOAL:   Patient will meet greater than or equal to 90% of their needs  Met with TF  MONITOR:   TF tolerance  REASON FOR ASSESSMENT:   Ventilator    ASSESSMENT:   70 yo male admitted with STEMI with cardiogenic shock, cardiac arrest and underwent PCI with stent to RCA, Impella placement  Discussed patient in ICU rounds and with RN today. Patient was extubated 5/4. Transferring to the floor today.  Cortrak remains in place. Receiving Osmolite 1.5 at 60 ml/h with Prosouce TF 90 ml BID. Tolerating TF well to meet 100% of nutrition needs.  Free water flushes 200 ml every 4 hours. Patient is on a clear liquid diet, but RN has not fed him because he is not alert enough to swallow safely. Nephrology following for HD needs. No indications for HD today.  Labs reviewed. Na 146, BUN 111, Creat 2.9 CBG: [phone]  Medications reviewed and include Colace, Nutrisource Fiber, Novolog SSI, Miralax.  UOP 3,275 ml x 24 hours Rectal tube 100 ml output x 24 hours (has since been removed)  Diet Order:   Diet Order            Diet clear liquid Room service appropriate? Yes; Fluid consistency: Thin  Diet effective now                 EDUCATION NEEDS:   Not appropriate for education at this time  Skin:  Skin Assessment: Reviewed RN Assessment  Last BM:  5/5  Height:   Ht Readings from Last 1 Encounters:  01/13/21 6' 1"  (1.854 m)    Weight:   Wt Readings from Last 1 Encounters:  01/23/21 83.5 kg    BMI:  Body mass index is 24.29 kg/m.  Estimated Nutritional Needs:   Kcal:  2100-2300  Protein:  110-125  gm  Fluid:  >2 L/day    Lucas Mallow, RD, LDN, CNSC Please refer to Amion for contact information.

## 2021-01-25 ENCOUNTER — Inpatient Hospital Stay (HOSPITAL_COMMUNITY): Payer: Medicare PPO

## 2021-01-25 DIAGNOSIS — Z66 Do not resuscitate: Secondary | ICD-10-CM | POA: Diagnosis not present

## 2021-01-25 DIAGNOSIS — R570 Cardiogenic shock: Secondary | ICD-10-CM | POA: Diagnosis not present

## 2021-01-25 DIAGNOSIS — Z7189 Other specified counseling: Secondary | ICD-10-CM | POA: Diagnosis not present

## 2021-01-25 DIAGNOSIS — Z515 Encounter for palliative care: Secondary | ICD-10-CM | POA: Diagnosis not present

## 2021-01-25 LAB — COMPREHENSIVE METABOLIC PANEL
ALT: 101 U/L — ABNORMAL HIGH (ref 0–44)
AST: 40 U/L (ref 15–41)
Albumin: 2.4 g/dL — ABNORMAL LOW (ref 3.5–5.0)
Alkaline Phosphatase: 127 U/L — ABNORMAL HIGH (ref 38–126)
Anion gap: 12 (ref 5–15)
BUN: 122 mg/dL — ABNORMAL HIGH (ref 8–23)
CO2: 26 mmol/L (ref 22–32)
Calcium: 8.6 mg/dL — ABNORMAL LOW (ref 8.9–10.3)
Chloride: 112 mmol/L — ABNORMAL HIGH (ref 98–111)
Creatinine, Ser: 3.13 mg/dL — ABNORMAL HIGH (ref 0.61–1.24)
GFR, Estimated: 21 mL/min — ABNORMAL LOW (ref >60)
Glucose, Bld: 179 mg/dL — ABNORMAL HIGH (ref 70–99)
Potassium: 3.6 mmol/L (ref 3.5–5.1)
Sodium: 150 mmol/L — ABNORMAL HIGH (ref 135–145)
Total Bilirubin: 0.8 mg/dL (ref 0.3–1.2)
Total Protein: 6.7 g/dL (ref 6.5–8.1)

## 2021-01-25 LAB — URINALYSIS, ROUTINE W REFLEX MICROSCOPIC
Bilirubin Urine: NEGATIVE
Glucose, UA: NEGATIVE mg/dL
Ketones, ur: NEGATIVE mg/dL
Nitrite: NEGATIVE
Protein, ur: NEGATIVE mg/dL
Specific Gravity, Urine: 1.015 (ref 1.005–1.030)
WBC, UA: >50 WBC/hpf — ABNORMAL HIGH (ref 0–5)
pH: 5 (ref 5.0–8.0)

## 2021-01-25 LAB — CBC
HCT: 25.9 % — ABNORMAL LOW (ref 39.0–52.0)
Hemoglobin: 8.3 g/dL — ABNORMAL LOW (ref 13.0–17.0)
MCH: 31.7 pg (ref 26.0–34.0)
MCHC: 32 g/dL (ref 30.0–36.0)
MCV: 98.9 fL (ref 80.0–100.0)
Platelets: 319 10*3/uL (ref 150–400)
RBC: 2.62 MIL/uL — ABNORMAL LOW (ref 4.22–5.81)
RDW: 18.3 % — ABNORMAL HIGH (ref 11.5–15.5)
WBC: 21.6 10*3/uL — ABNORMAL HIGH (ref 4.0–10.5)
nRBC: 0 % (ref 0.0–0.2)

## 2021-01-25 LAB — GLUCOSE, CAPILLARY
Glucose-Capillary: 129 mg/dL — ABNORMAL HIGH (ref 70–99)
Glucose-Capillary: 134 mg/dL — ABNORMAL HIGH (ref 70–99)
Glucose-Capillary: 140 mg/dL — ABNORMAL HIGH (ref 70–99)
Glucose-Capillary: 149 mg/dL — ABNORMAL HIGH (ref 70–99)
Glucose-Capillary: 156 mg/dL — ABNORMAL HIGH (ref 70–99)
Glucose-Capillary: 158 mg/dL — ABNORMAL HIGH (ref 70–99)

## 2021-01-25 LAB — COOXEMETRY PANEL
Carboxyhemoglobin: 1.6 % — ABNORMAL HIGH (ref 0.5–1.5)
Methemoglobin: 1.1 % (ref 0.0–1.5)
O2 Saturation: 62.4 %
Total hemoglobin: 8.4 g/dL — ABNORMAL LOW (ref 12.0–16.0)

## 2021-01-25 LAB — MAGNESIUM: Magnesium: 2.4 mg/dL (ref 1.7–2.4)

## 2021-01-25 MED ORDER — FREE WATER
200.0000 mL | Status: DC
  Administered 2021-01-25: 200 mL

## 2021-01-25 MED ORDER — SODIUM CHLORIDE 0.9 % IV SOLN
1.0000 g | INTRAVENOUS | Status: DC
  Administered 2021-01-25 – 2021-01-26 (×2): 1 g via INTRAVENOUS
  Filled 2021-01-25 (×2): qty 10

## 2021-01-25 MED ORDER — SODIUM CHLORIDE 0.9 % IV SOLN
1.0000 g | INTRAVENOUS | Status: DC
  Administered 2021-01-25: 1 g via INTRAVENOUS
  Filled 2021-01-25: qty 10

## 2021-01-25 MED ORDER — FREE WATER
300.0000 mL | Status: DC
  Administered 2021-01-25 – 2021-01-28 (×37): 300 mL

## 2021-01-25 NOTE — Progress Notes (Signed)
PROGRESS NOTE  ICU transfer  [name]  QIO:[id number] DOB: [birth year] DOA: 01/03/2021 PCP: [name], MD   Brief Narrative: Taken from prior notes. HPI: "70 year old male with PMHx HTN, HLD, CAD admitted as a STEMI who suffered cardiac arrest in hospital prior to emergent cardiac cath. PCI with stent to proximal RCA. EF 25-30%. Prolonged ICU stay for cardiogenic/hemorrhagic shock requiring vasopressors and transfusions. Mental status did not greatly improve as his hemodynamics did. MRI showed scattered punctate infarctions concerning for anoxic injury. AKI progressed to require CRRT, he has since been transitioned to [facility]. Extubated 4/26. Did not pass swallow due to aspiration risk. 4/30 he developed worsening hypoxia and developed hemoptysis with multiple large clots being expectorated."  Significant Events: 4/20 Impella removed. MRI brain with numerous micro embolic infarctions. Off pressors.  4/21 lasix drip stopped.  4/22 started CVVH 4/24 CVVH stopped. Failed extubation. Re-intubated.  4/26 Extubated  5/1 Re-intubated 5/4 Extubated, off pressors   Patient's mental status remains altered.  Significant anoxic brain injury, doubtful he will have any meaningful recovery.  Received CRRT followed by intermittent dialysis through temporary HD catheter during current hospitalization.  Became febrile with worsening leukocytosis.  Palliative met with [facility] POA and decided not to escalate care, they need some time to think about proceeding with full comfort measures only.  Nephrology and cardiology following.  Subjective: Patient was alert, awake and appears comfortable.  Unable to communicate.  He was smiling but unable to follow any commands with me.  Moving head from side to side.  Assessment & Plan:   Active Problems:   Cardiogenic shock (HCC)   Hemoptysis   Acute hypoxemic respiratory failure (HCC)   Acute renal failure (HCC)   Encephalopathy acute  Cardiogenic shock.   Resolved.  Blood pressure currently stable.  CAD/ Acute Inferior STEMI/acute HFrEF.  EF of 20 to 25%, Impella out now. - emergent cath w/ pRCA occlusion s/p PCI + DES. -Continue DAPT and statin for now. -No escalation of care. -Continue with IV Lasix -No beta-blocker, ARN I, MRA due to AKI. -Continue with hydralazine and nitrates as needed  S/p VF arrest.  Patient suffered significant anoxic brain injury and making little to no progress.  Very guarded prognosis.  Palliative care was also consulted, patient was made DNR with no escalation of care.  Family needs some more time to decide before proceeding for full comfort measures only.  Fever.  Patient became febrile with worsening leukocytosis.  Patient with serious life-threatening acute illnesses and prolonged hospitalization requiring intubation twice.  HD catheter in place for CRRT and HD.  Catheter site looks okay.  Foley has been removed. UA with pyuria and bacteriuria. -Infectious work-up which include blood and urine culture, chest x-ray. -Remove HD catheter as no more required and send catheter tip for culture. -Start him on ceftriaxone.  Nonoliguric AKI.  Most likely secondary to ATN/shock.  Patient received CRRT and 1 session of HD.  Creatinine seems improving with significant azotemia. -No more HD per nephrology. -Remove HD catheter as he had a fever over the past 24-hour with worsening leukocytosis.  Anoxic brain injury  - EEG w/ generalized periodic discharges with triphasic morphology + moderate to severe diffuse encephalopathy.  - MRI- numerous micro embolic infarctions . No hemorrhage or mass.  - CIR has turned down due to poor rehab potential. Recommending SNF - He appears to have significant anoxic injury, significant meaningful recovery seems unlikely. -Family to decide about comfort measures after the weekend-currently no escalation  of care  Hypernatremia. -Increase free water through core track.  Nutritional  status.  Currently being fed with core track. -Swallow evaluation.   Objective: Vitals:   01/25/21 0513 01/25/21 0700 01/25/21 1119 01/25/21 1602  BP: 126/70 137/76 114/68 137/76  Pulse:  98 (!) 105 (!) 102  Resp:  18 17 18   Temp:  98.4 F (36.9 C) 99.5 F (37.5 C) 99 F (37.2 C)  TempSrc:  Axillary Oral Oral  SpO2:  95% 93% 95%  Weight:      Height:        Intake/Output Summary (Last 24 hours) at 01/25/2021 1724 Last data filed at 01/25/2021 1459 Gross per 24 hour  Intake 1842 ml  Output 2500 ml  Net -658 ml   Filed Weights   01/22/21 0313 01/23/21 0348 01/25/21 0404  Weight: 86.4 kg 83.5 kg 82 kg    Examination:  General exam: Appears calm and comfortable  Respiratory system: Clear to auscultation. Respiratory effort normal. Cardiovascular system: S1 & S2 heard, RRR. No JVD, murmurs, rubs, gallops or clicks. Gastrointestinal system: Soft, nontender, nondistended, bowel sounds positive. Central nervous system: Alert but not following any commands. Extremities: No edema, no cyanosis, pulses intact and symmetrical. Psychiatry: Judgement and insight appear impaired.   DVT prophylaxis: Lovenox Code Status: DNR Family Communication:  Disposition Plan:  Status is: Inpatient  Remains inpatient appropriate because:Inpatient level of care appropriate due to severity of illness   Dispo: The patient is from: Home              Anticipated d/c is to: To be determined              Patient currently is not medically stable to d/c.   Difficult to place patient No              Level of care: Progressive  All the records are reviewed and case discussed with Care Management/Social Worker. Management plans discussed with the patient, nursing and they are in agreement.  Consultants:   PCCM  Cardiology  Nephrology  Procedures:  Intubation Cardiac catheterization Hemodialysis  Antimicrobials:  Ceftriaxone  Data Reviewed: I have personally reviewed following labs and  imaging studies  CBC: Recent Labs  Lab 01/19/21 0626 01/20/21 0122 01/20/21 0140 01/21/21 0346 01/24/21 0359 01/25/21 0524  WBC 12.7* 24.2*  --  22.0* 22.1* 21.6*  NEUTROABS  --   --   --  19.9*  --   --   HGB 8.8* 8.2* 7.8* 8.0* 8.4* 8.3*  HCT 27.5* 24.7* 23.0* 24.8* 26.1* 25.9*  MCV 97.2 99.2  --  96.5 97.0 98.9  PLT 478* 451*  --  391 387 440   Basic Metabolic Panel: Recent Labs  Lab 01/20/21 0122 01/20/21 0140 01/21/21 0346 01/22/21 0337 01/23/21 0405 01/24/21 0359 01/25/21 0524  NA 141   < > 142 142 144 146* 150*  K 4.7   < > 3.8 3.7 3.5 3.8 3.6  CL 103  --  106 107 110 111 112*  CO2 26  --  25 24 25 25 26   GLUCOSE 119*  --  159* 153* 158* 177* 179*  BUN 62*  --  85* 94* 102* 111* 122*  CREATININE 2.46*  --  2.96* 2.99* 3.00* 2.90* 3.13*  CALCIUM 8.1*  --  8.2* 8.2* 8.4* 8.6* 8.6*  MG 1.9  --  2.1 2.3 2.4 2.3 2.4  PHOS 6.2*  --   --   --   --   --   --    < > =  values in this interval not displayed.   GFR: Estimated Creatinine Clearance: 24.8 mL/min (A) (by C-G formula based on SCr of 3.13 mg/dL (H)). Liver Function Tests: Recent Labs  Lab 01/20/21 0421 01/22/21 0337 01/23/21 0405 01/24/21 0359 01/25/21 0524  AST 56* 36 65* 69* 40  ALT 104* 70* 104* 130* 101*  ALKPHOS 109 108 121 140* 127*  BILITOT 0.7 0.7 0.9 0.9 0.8  PROT 6.1* 6.3* 6.5 6.5 6.7  ALBUMIN 2.4* 2.3* 2.5* 2.5* 2.4*   No results for input(s): LIPASE, AMYLASE in the last 168 hours. Recent Labs  Lab 01/20/21 0518  AMMONIA 27   Coagulation Profile: No results for input(s): INR, PROTIME in the last 168 hours. Cardiac Enzymes: No results for input(s): CKTOTAL, CKMB, CKMBINDEX, TROPONINI in the last 168 hours. BNP (last 3 results) No results for input(s): PROBNP in the last 8760 hours. HbA1C: No results for input(s): HGBA1C in the last 72 hours. CBG: Recent Labs  Lab 01/24/21 1942 01/25/21 0004 01/25/21 0339 01/25/21 1122 01/25/21 1554  GLUCAP 155* 129* 156* 158* 149*    Lipid Profile: No results for input(s): CHOL, HDL, LDLCALC, TRIG, CHOLHDL, LDLDIRECT in the last 72 hours. Thyroid Function Tests: No results for input(s): TSH, T4TOTAL, FREET4, T3FREE, THYROIDAB in the last 72 hours. Anemia Panel: No results for input(s): VITAMINB12, FOLATE, FERRITIN, TIBC, IRON, RETICCTPCT in the last 72 hours. Sepsis Labs: No results for input(s): PROCALCITON, LATICACIDVEN in the last 168 hours.  No results found for this or any previous visit (from the past 240 hour(s)).   Radiology Studies: DG Chest 2 View  Result Date: 01/25/2021 CLINICAL DATA:  Fever. EXAM: CHEST - 2 VIEW COMPARISON:  Chest x-ray dated Jan 20, 2021. FINDINGS: Interval extubation. Unchanged feeding tube and tunneled left internal jugular dialysis catheter. The heart size and mediastinal contours are within normal limits. Normal pulmonary vascularity. Minimal bibasilar atelectasis. No focal consolidation, pleural effusion, or pneumothorax. No acute osseous abnormality. IMPRESSION: 1. Interval extubation.  No active cardiopulmonary disease. Electronically Signed   By: Titus Dubin M.D.   On: 01/25/2021 12:42    Scheduled Meds: . aspirin  81 mg Per Tube Daily  . chlorhexidine gluconate (MEDLINE KIT)  15 mL Mouth Rinse BID  . Chlorhexidine Gluconate Cloth  6 each Topical Daily  . docusate  100 mg Oral BID  . enoxaparin (LOVENOX) injection  30 mg Subcutaneous Q24H  . feeding supplement (PROSource TF)  90 mL Per Tube BID  . fiber  1 packet Oral BID  . free water  300 mL Per Tube Q2H  . hydrALAZINE  12.5 mg Per Tube Q8H  . insulin aspart  0-9 Units Subcutaneous Q4H  . isosorbide dinitrate  5 mg Per Tube TID  . mouth rinse  15 mL Mouth Rinse q12n4p  . modafinil  100 mg Per Tube Daily  . pantoprazole sodium  40 mg Per Tube Daily  . polyethylene glycol  17 g Per Tube BID  . rosuvastatin  10 mg Per Tube Daily  . ticagrelor  90 mg Per Tube BID   Continuous Infusions: . feeding supplement (OSMOLITE  1.5 CAL) 1,000 mL (01/24/21 0500)     LOS: 22 days   Time spent: 50 minutes. More than 50% of the time was spent in counseling/coordination of care  Lorella Nimrod, MD [facility]  If 7PM-7AM, please contact night-coverage [email or web address]  01/25/2021, 5:24 PM   This record has been created using Systems analyst. Errors have been sought and corrected,but  may not always be located. Such creation errors do not reflect on the standard of care.

## 2021-01-25 NOTE — Progress Notes (Signed)
Patient ID: [name], male   DOB: July 10, 1951, 70 y.o.   MRN: [id number]     Advanced Heart Failure Rounding Note  PCP-Cardiologist: None   Subjective:    4/20 Impella removed. MRI brain with numerous micro embolic infarctions. Off pressors.  4/21 lasix drip stopped.  4/22 started CVVH 4/24 CVVH stopped. Failed extubation. Re-intubated.  4/26 Extubated  5/1 Re-intubated 5/4 Extubated   Awake nods to questions. Follows occasional commands. Remains febrile to 100.7  Co-ox 62%  BUN/Cr increasing 122/3.1. WBC stable at 22k  Objective:   Weight Range: 82 kg Body mass index is 23.85 kg/m.   Vital Signs:   Temp:  [98.3 F (36.8 C)-100.7 F (38.2 C)] 99.5 F (37.5 C) (05/06 1119) Pulse Rate:  [87-105] 105 (05/06 1119) Resp:  [17-25] 17 (05/06 1119) BP: (114-137)/(58-77) 114/68 (05/06 1119) SpO2:  [91 %-98 %] 93 % (05/06 1119) Weight:  [82 kg] 82 kg (05/06 0404) Last BM Date: 01/24/21  Weight change: Filed Weights   01/22/21 0313 01/23/21 0348 01/25/21 0404  Weight: 86.4 kg 83.5 kg 82 kg    Intake/Output:   Intake/Output Summary (Last 24 hours) at 01/25/2021 1145 Last data filed at 01/25/2021 1034 Gross per 24 hour  Intake 2022 ml  Output 2000 ml  Net 22 ml      Physical Exam   General:  Sitting up in bed.  HEENT: normal Neck: supple. no JVD. Carotids 2+ bilat; no bruits. No lymphadenopathy or thryomegaly appreciated. Cor: PMI nondisplaced. Regular tachy No rubs, gallops or murmurs. Lungs: clear Abdomen: soft, nontender, nondistended. No hepatosplenomegaly. No bruits or masses. Good bowel sounds. Extremities: no cyanosis, clubbing, rash, edema Neuro: Awake nods to questions. Follows occasional commands. RUE appears plegic  Telemetry   Sinus 100-110 Personally reviewed  Labs    CBC Recent Labs    01/24/21 0359 01/25/21 0524  WBC 22.1* 21.6*  HGB 8.4* 8.3*  HCT 26.1* 25.9*  MCV 97.0 98.9  PLT 387 616   Basic Metabolic Panel Recent Labs     01/24/21 0359 01/25/21 0524  NA 146* 150*  K 3.8 3.6  CL 111 112*  CO2 25 26  GLUCOSE 177* 179*  BUN 111* 122*  CREATININE 2.90* 3.13*  CALCIUM 8.6* 8.6*  MG 2.3 2.4   Liver Function Tests Recent Labs    01/24/21 0359 01/25/21 0524  AST 69* 40  ALT 130* 101*  ALKPHOS 140* 127*  BILITOT 0.9 0.8  PROT 6.5 6.7  ALBUMIN 2.5* 2.4*   No results for input(s): LIPASE, AMYLASE in the last 72 hours. Cardiac Enzymes No results for input(s): CKTOTAL, CKMB, CKMBINDEX, TROPONINI in the last 72 hours.  BNP: BNP (last 3 results) No results for input(s): BNP in the last 8760 hours.  ProBNP (last 3 results) No results for input(s): PROBNP in the last 8760 hours.   D-Dimer No results for input(s): DDIMER in the last 72 hours. Hemoglobin A1C No results for input(s): HGBA1C in the last 72 hours. Fasting Lipid Panel No results for input(s): CHOL, HDL, LDLCALC, TRIG, CHOLHDL, LDLDIRECT in the last 72 hours. Thyroid Function Tests No results for input(s): TSH, T4TOTAL, T3FREE, THYROIDAB in the last 72 hours.  Invalid input(s): FREET3  Other results:   Imaging    No results found.   Medications:     Scheduled Medications: . aspirin  81 mg Per Tube Daily  . chlorhexidine gluconate (MEDLINE KIT)  15 mL Mouth Rinse BID  . Chlorhexidine Gluconate Cloth  6 each  Topical Daily  . docusate  100 mg Oral BID  . enoxaparin (LOVENOX) injection  30 mg Subcutaneous Q24H  . feeding supplement (PROSource TF)  90 mL Per Tube BID  . fiber  1 packet Oral BID  . free water  200 mL Per Tube Q2H  . hydrALAZINE  12.5 mg Per Tube Q8H  . insulin aspart  0-9 Units Subcutaneous Q4H  . isosorbide dinitrate  5 mg Per Tube TID  . mouth rinse  15 mL Mouth Rinse q12n4p  . modafinil  100 mg Per Tube Daily  . pantoprazole sodium  40 mg Per Tube Daily  . polyethylene glycol  17 g Per Tube BID  . rosuvastatin  10 mg Per Tube Daily  . ticagrelor  90 mg Per Tube BID    Infusions: . feeding  supplement (OSMOLITE 1.5 CAL) 1,000 mL (01/24/21 0500)    PRN Medications: acetaminophen (TYLENOL) oral liquid 160 mg/5 mL, atropine, ipratropium-albuterol, ondansetron (ZOFRAN) IV, sodium chloride flush    Assessment/Plan   1. CAD/ Acute Inferior STEMI - emergent cath w/ pRCA occlusion s/p PCI + DES - Continue DAPT/statin - no s/s angina  2. VF Arrest - in setting of acute MI, now s/p revascularization  - rhythm stable.  - appears to have significant anoxic brain injury and is making little to no further progress  3. Acute Systolic Heart Failure>>Cardiogenic Shock  - ICM Echo LVEF 20-25%, RV moderately reduced - Impella out 4/20. Off all pressors. Co-ox 68% - CVVH stopped 4/24. Volume status ok. Continue IV Lasix 80 mg x 1 today.  - No b-blocker, ARNI, MRA with AKI  - Continue  Hydral and nitrates. BP stable    4. Acute hypoxic respiratory failure - 4/24 failed extubation trial.  - 5/1 reintubated due to inability to handle secretions/hemoptysis - 5/4 extubated   5. Nonoliguric AKI - due to ATN/shock - CVVH begun 4/22 and stopped 4/24. Had one session of iHD on 4/30 for clearance - Creatinine and BUN continue to rise.  - Have decided on no further HD  6. Anoxic brain injury  - EEG w/ generalized periodic discharges with triphasic morphology + moderate to severe diffuse encephalopathy.  - MRI- numerous micro embolic infarctions . No hemorrhage or mass.  - CIR has turned down due to poor rehab potential. Recommending SNF - He appears to have significant anoxic injury but is clearly following commands better today - I had discussion with his HCPOA, [name] earlier this week Given MSOF and evidence of significant anoxic brain injury, I am concerned that he may not be able  achieve meaningful outcome that he would tolerate given his pre-MI condition - Given progress over last 24-48 hours, I would advocate for ongoing care. C/w [place] discussions. Palliative following   7.  ID:  - temp 100.7, WBC 22 K (stable)  8. Hypernatremia - increase FW flushes.   Unfortunately his progress has stalled and now that he is extubated it seems clear that a meaningful recovery that he would be happy with is probably not possible. Agree with supportive care but no escalation (no further HD, intubation or CPR). Palliative to meet with him and HCPOA again today.   Length of Stay: 22  Glori Bickers, MD  11:45 AM

## 2021-01-25 NOTE — Progress Notes (Signed)
Pt found to be unhooked from rocephin while IVF running. Large puddle of fluids found on the floor. Unsure if pt redeived dose of abx. MD notified. Orders to give Rocephin.

## 2021-01-25 NOTE — Progress Notes (Signed)
Palliative Medicine Inpatient Follow Up Note  Reason for consult: Discuss goals of care  HPI:  Per intake H&P --> [name] "[name]" [name] has been admitted to [facility] for the past 16 days.  He has had a very complicated hospitalization inclusive of an intensive care unit stay for cardiogenic shock he also went into V. fib arrest and required multiple CPR episodes.  He has been medically optimized and is now on the cardiac unit though there is concern regarding his his MRI scan for anoxic brain injury.  Palliative care has been asked to get involved in the setting of suspected anoxic brain injury to further discuss goals of care with patient's power of attorney, [name].  Today's Discussion (01/25/2021):  *Please note that this is a verbal dictation therefore any spelling or grammatical errors are due to the "[place] One" system interpretation.  Chart reviewed this morning. Received a secure chat from patient RN, Shawn Route regarding concerns over Lussier inability to mobilize secretions, follow commands. There is worry regarding his clinical condition that he is unlikely to recover to the point of functionality again. It has been requested that the PMT speak to patients HCPOA, [name] regarding this concerns.   [name] has now been hospitalized for 21 days. It appears his anoxic brain injury is likely more profound than originally thought. We will pursue additional Elias-Fela Solis meetings to further determine what Hageman wishes would be at this juncture.   I spoke with patient's healthcare power of attorney this afternoon, [name].  Reviewed [name]'s tumultuous last 48 hours inclusive of the reality that he remains to have a poor cough reflex, poor ability to clear secretions, altered cognitive state.  Reviewed with [name] kidney sated laboratory results.  [name] shares with me that he had a wonderful day with Rett this week whereby he was more responsive.  Another friend of theirs stop by  who he was able to in his own way it seemed joke with.  [name] shares with me his disbelief that Avir is now again doing poorly.  We reviewed that [name] may do well for reason and [name] does state he will rely on that moving forward.  [name] expresses that he would like the weekend to think about Cambren's clinical condition before proceeding with any additional decisions.  He is in favor speaking on Monday though if anything does change and if [name] does deteriorate we will reconvene sooner.  Plan for the time being is DO NOT RESUSCITATE DO NOT INTUBATE do not proceed with any additional dialysis treatments.  Discussed that Laden's body will be the teller as to its ability to recover.  If [name] further deteriorates reviewed that it is reasonable to change our focus.    Questions and concerns addressed   Objective Assessment: Vital Signs Vitals:   01/25/21 0357 01/25/21 0513  BP: 132/65 126/70  Pulse: 89   Resp: 20   Temp: 99.1 F (37.3 C)   SpO2: 94%     Intake/Output Summary (Last 24 hours) at 01/25/2021 0717 Last data filed at 01/25/2021 4627 Gross per 24 hour  Intake 2262 ml  Output 1825 ml  Net 437 ml   Last Weight  Most recent update: 01/25/2021  4:08 AM   Weight  82 kg (180 lb 12.4 oz)           Gen: Elderly ill-appearing male HEENT: ETT, Core-track,, dry mucous membranes CV: Irregular rate and rhythm PULM: 2LPM [place] ABD: soft/nontender/nondistended  EXT: No edema  Neuro:  Opens eyes, looks at me, not following commands  SUMMARY OF RECOMMENDATIONS DNAR/DNI, no more HD  Advance directives are in [place] present course of treatment  Patients HCPOA, [name] will communicate with the care team on Monday though if [name] declines prior [name] will be reachable and able to help make decisions at that time  Ongoing palliative care support  Time Spent: 35 Greater than 50% of the time was spent in counseling and coordination of  care ______________________________________________________________________________________ [place] Team Team Cell Phone: [phone] Please utilize secure chat with additional questions, if there is no response within 30 minutes please call the above phone number  Palliative Medicine Team providers are available by phone from 7am to 7pm daily and can be reached through the team cell phone.  Should this patient require assistance outside of these hours, please call the patient's attending physician.

## 2021-01-25 NOTE — Care Management Important Message (Signed)
Important Message  Patient Details  Name: [name] MRN: [id number] Date of Birth: [birth year]   Medicare Important Message Given:  Yes     [name] 01/25/2021, 9:33 AM

## 2021-01-25 NOTE — Progress Notes (Signed)
OT Cancellation Note  Patient Details Name: [name] MRN: [id number] DOB: [birth year]   Cancelled Treatment:    Reason Eval/Treat Not Completed: Medical issues which prohibited therapy;Other (comment). RN requested OT hold off and check back later today and palliative consult/meeting with family pending with pt possibly going comfort care/hospice  Britt Bottom 01/25/2021, 12:01 PM

## 2021-01-25 NOTE — Progress Notes (Signed)
[facility] NEPHROLOGY PROGRESS NOTE  Assessment/ Plan: Pt is a 70 y.o. yo male with history of HTN, HLD, CAD presented with chest pain found to have STEMI.  He underwent left heart cath, placement of Impella device, had V. fib arrest, mechanical ventilation for respiratory failure and AKI.  #Acute kidney injury due to cardiogenic/hemorrhagic shock.  UA bland and kidney ultrasound unremarkable.  Required CRRT from 4/22-4/24.  Patient is nonoliguric and volume status acceptable.  Received intermittent hemodialysis on 4/30 for clearance.  After conversations with palliative medicine decision is made for no further dialysis treatments which is very reasonable .  Azotemia is a combination of low GFR, diuresis, high protein intake.   #Hypernatremia due to increase free water excretion and recovering AKI.  Mild CTM   #CAD/acute STEMI required emergent cath with stent placement.  EF 20 to 25%.  AHF following.  #V. fib arrest/cardiogenic shock, acute systolic CHF: Arrest in the setting of acute MI.  Impella was removed.  Now off of pressors or inotropes.  Volume status is acceptable.  #Ventilator dependent respiratory failure: Extubated 01/23/21  #Anemia due to acute blood loss: Required multiple blood transfusion.  Transfuse as needed.  #Hypokalemia: Potassium level improved.  #Encephalopathy: Monitor  Subjective:   Palliative notes reviewed: Family asked for no further dialysis treatments  Adequate UOP  Azotemia continues to worsen, BUN 122, K3.6, sodium 150  He remains encephalopathic, minimally interactive  Has L IJ Temp HD cath present  Objective Vital signs in last 24 hours: Vitals:   01/25/21 0404 01/25/21 0513 01/25/21 0700 01/25/21 1119  BP:  126/70 137/76 114/68  Pulse:   98 (!) 105  Resp:   18 17  Temp:   98.4 F (36.9 C) 99.5 F (37.5 C)  TempSrc:   Axillary Oral  SpO2:   95% 93%  Weight: 82 kg     Height:       Weight change:   Intake/Output  Summary (Last 24 hours) at 01/25/2021 1431 Last data filed at 01/25/2021 1034 Gross per 24 hour  Intake 1842 ml  Output 2000 ml  Net -158 ml       Labs: Basic Metabolic Panel: Recent Labs  Lab 01/20/21 0122 01/20/21 0140 01/23/21 0405 01/24/21 0359 01/25/21 0524  NA 141   < > 144 146* 150*  K 4.7   < > 3.5 3.8 3.6  CL 103   < > 110 111 112*  CO2 26   < > 25 25 26   GLUCOSE 119*   < > 158* 177* 179*  BUN 62*   < > 102* 111* 122*  CREATININE 2.46*   < > 3.00* 2.90* 3.13*  CALCIUM 8.1*   < > 8.4* 8.6* 8.6*  PHOS 6.2*  --   --   --   --    < > = values in this interval not displayed.   Liver Function Tests: Recent Labs  Lab 01/23/21 0405 01/24/21 0359 01/25/21 0524  AST 65* 69* 40  ALT 104* 130* 101*  ALKPHOS 121 140* 127*  BILITOT 0.9 0.9 0.8  PROT 6.5 6.5 6.7  ALBUMIN 2.5* 2.5* 2.4*   No results for input(s): LIPASE, AMYLASE in the last 168 hours. Recent Labs  Lab 01/20/21 0518  AMMONIA 27   CBC: Recent Labs  Lab 01/19/21 0626 01/20/21 0122 01/20/21 0140 01/21/21 0346 01/24/21 0359 01/25/21 0524  WBC 12.7* 24.2*  --  22.0* 22.1* 21.6*  NEUTROABS  --   --   --  19.9*  --   --   HGB 8.8* 8.2*   < > 8.0* 8.4* 8.3*  HCT 27.5* 24.7*   < > 24.8* 26.1* 25.9*  MCV 97.2 99.2  --  96.5 97.0 98.9  PLT 478* 451*  --  391 387 319   < > = values in this interval not displayed.   Cardiac Enzymes: No results for input(s): CKTOTAL, CKMB, CKMBINDEX, TROPONINI in the last 168 hours. CBG: Recent Labs  Lab 01/24/21 1614 01/24/21 1942 01/25/21 0004 01/25/21 0339 01/25/21 1122  GLUCAP 141* 155* 129* 156* 158*    Iron Studies:  No results for input(s): IRON, TIBC, TRANSFERRIN, FERRITIN in the last 72 hours. Studies/Results: DG Chest 2 View  Result Date: 01/25/2021 CLINICAL DATA:  Fever. EXAM: CHEST - 2 VIEW COMPARISON:  Chest x-ray dated Jan 20, 2021. FINDINGS: Interval extubation. Unchanged feeding tube and tunneled left internal jugular dialysis catheter. The  heart size and mediastinal contours are within normal limits. Normal pulmonary vascularity. Minimal bibasilar atelectasis. No focal consolidation, pleural effusion, or pneumothorax. No acute osseous abnormality. IMPRESSION: 1. Interval extubation.  No active cardiopulmonary disease. Electronically Signed   By: Titus Dubin M.D.   On: 01/25/2021 12:42    Medications: Infusions: . feeding supplement (OSMOLITE 1.5 CAL) 1,000 mL (01/24/21 0500)    Scheduled Medications: . aspirin  81 mg Per Tube Daily  . chlorhexidine gluconate (MEDLINE KIT)  15 mL Mouth Rinse BID  . Chlorhexidine Gluconate Cloth  6 each Topical Daily  . docusate  100 mg Oral BID  . enoxaparin (LOVENOX) injection  30 mg Subcutaneous Q24H  . feeding supplement (PROSource TF)  90 mL Per Tube BID  . fiber  1 packet Oral BID  . free water  300 mL Per Tube Q2H  . hydrALAZINE  12.5 mg Per Tube Q8H  . insulin aspart  0-9 Units Subcutaneous Q4H  . isosorbide dinitrate  5 mg Per Tube TID  . mouth rinse  15 mL Mouth Rinse q12n4p  . modafinil  100 mg Per Tube Daily  . pantoprazole sodium  40 mg Per Tube Daily  . polyethylene glycol  17 g Per Tube BID  . rosuvastatin  10 mg Per Tube Daily  . ticagrelor  90 mg Per Tube BID    have reviewed scheduled and prn medications.  Physical Exam: General: Critically ill looking male , on [place], eyes open, nods to questions Heart:RRR, s1s2 nl, no rubs Lungs: Coarse breath sound bilateral Abdomen:soft,  non-distended Extremities:No edema Dialysis Access: L IJ temporary HD catheter  Kamirah Shugrue B Brinsley Wence 01/25/2021,2:31 PM  LOS: 22 days

## 2021-01-26 DIAGNOSIS — G9340 Encephalopathy, unspecified: Secondary | ICD-10-CM | POA: Diagnosis not present

## 2021-01-26 DIAGNOSIS — Z515 Encounter for palliative care: Secondary | ICD-10-CM | POA: Diagnosis not present

## 2021-01-26 DIAGNOSIS — N170 Acute kidney failure with tubular necrosis: Secondary | ICD-10-CM | POA: Diagnosis not present

## 2021-01-26 DIAGNOSIS — J9601 Acute respiratory failure with hypoxia: Secondary | ICD-10-CM | POA: Diagnosis not present

## 2021-01-26 DIAGNOSIS — Z7189 Other specified counseling: Secondary | ICD-10-CM | POA: Diagnosis not present

## 2021-01-26 DIAGNOSIS — R570 Cardiogenic shock: Secondary | ICD-10-CM | POA: Diagnosis not present

## 2021-01-26 LAB — GLUCOSE, CAPILLARY
Glucose-Capillary: 141 mg/dL — ABNORMAL HIGH (ref 70–99)
Glucose-Capillary: 126 mg/dL — ABNORMAL HIGH (ref 70–99)
Glucose-Capillary: 135 mg/dL — ABNORMAL HIGH (ref 70–99)
Glucose-Capillary: 135 mg/dL — ABNORMAL HIGH (ref 70–99)
Glucose-Capillary: 148 mg/dL — ABNORMAL HIGH (ref 70–99)
Glucose-Capillary: 114 mg/dL — ABNORMAL HIGH (ref 70–99)

## 2021-01-26 LAB — COMPREHENSIVE METABOLIC PANEL
ALT: 127 U/L — ABNORMAL HIGH (ref 0–44)
AST: 80 U/L — ABNORMAL HIGH (ref 15–41)
Albumin: 2.4 g/dL — ABNORMAL LOW (ref 3.5–5.0)
Alkaline Phosphatase: 135 U/L — ABNORMAL HIGH (ref 38–126)
Anion gap: 14 (ref 5–15)
BUN: 120 mg/dL — ABNORMAL HIGH (ref 8–23)
CO2: 24 mmol/L (ref 22–32)
Calcium: 8.5 mg/dL — ABNORMAL LOW (ref 8.9–10.3)
Chloride: 112 mmol/L — ABNORMAL HIGH (ref 98–111)
Creatinine, Ser: 3 mg/dL — ABNORMAL HIGH (ref 0.61–1.24)
GFR, Estimated: 22 mL/min — ABNORMAL LOW (ref >60)
Glucose, Bld: 153 mg/dL — ABNORMAL HIGH (ref 70–99)
Potassium: 3.6 mmol/L (ref 3.5–5.1)
Sodium: 150 mmol/L — ABNORMAL HIGH (ref 135–145)
Total Bilirubin: 0.5 mg/dL (ref 0.3–1.2)
Total Protein: 6.4 g/dL — ABNORMAL LOW (ref 6.5–8.1)

## 2021-01-26 LAB — MAGNESIUM: Magnesium: 2.5 mg/dL — ABNORMAL HIGH (ref 1.7–2.4)

## 2021-01-26 NOTE — Progress Notes (Signed)
Palliative Medicine Inpatient Follow Up Note  Reason for consult: Discuss goals of care  HPI:  Per intake H&P --> [name] has been admitted to [facility] for the past 16 days.  He has had a very complicated hospitalization inclusive of an intensive care unit stay for cardiogenic shock he also went into V. fib arrest and required multiple CPR episodes.  He has been medically optimized and is now on the cardiac unit though there is concern regarding his his MRI scan for anoxic brain injury.  Palliative care has been asked to get involved in the setting of suspected anoxic brain injury to further discuss goals of care with patient's power of attorney, [name].  Today's Discussion (01/25/2021):  *Please note that this is a verbal dictation therefore any spelling or grammatical errors are due to the "[place] One" system interpretation.  Chart reviewed this morning.  Spoke to patient's night RN who shares with me [name] was more alert overnight able to follow some commands per what she could observe.  Met with [name] this morning and this was the first day he was able to thoroughly follow my commands.  He was able to respond to me and able to lift his left arm up on command.  Given that he has shown some improvement I will request for PT/OT/speech therapy to evaluate him again today.  I will see if he would at this juncture be considered a good CIR candidate if goals remain to evaluate for improvement.  Patient's HCPOA [name] has requested that we only communicate this weekend if there is an acute change otherwise we plan to reconvene on Monday.  I was able to meet with the therapy team and physical therapy will be seeing [name] today.  Questions and concerns addressed   Objective Assessment: Vital Signs Vitals:   01/26/21 0547 01/26/21 0743  BP: (!) 143/64 131/70  Pulse:  94  Resp:  19  Temp:  98.5 F (36.9 C)  SpO2:      Intake/Output Summary (Last  24 hours) at 01/26/2021 2620 Last data filed at 01/26/2021 0800 Gross per 24 hour  Intake 3881 ml  Output 2125 ml  Net 1756 ml   Last Weight  Most recent update: 01/26/2021  1:17 AM   Weight  79.3 kg (174 lb 13.2 oz)           Gen: Elderly ill-appearing male HEENT: Core-track,, dry mucous membranes CV: Irregular rate and rhythm PULM: 2LPM [place] ABD: soft/nontender/nondistended  EXT: No edema  Neuro: Opens eyes, responds verbally this morning, able to follow commands to lift left arm and squeeze my hand  SUMMARY OF RECOMMENDATIONS DNAR/DNI, no more HD  Advance directives are in [place]  Continue present course of treatment  Have requested PT/OT/speech reevaluate [name]  Patients HCPOA, [name] will communicate with the care team on Monday though if [name] declines prior [name] will be reachable and able to help make decisions at that time  Ongoing palliative care support  Time Spent: 25 Greater than 50% of the time was spent in counseling and coordination of care ______________________________________________________________________________________ [place] Team Team Cell Phone: [phone] Please utilize secure chat with additional questions, if there is no response within 30 minutes please call the above phone number  Palliative Medicine Team providers are available by phone from 7am to 7pm daily and can be reached through the team cell phone.  Should this patient require assistance outside of these hours, please  call the patient's attending physician.

## 2021-01-26 NOTE — Progress Notes (Signed)
PROGRESS NOTE  ICU transfer  [name]  MRN:[id number] DOB: 10/24/1950 DOA: 01/03/2021 PCP: Fry, Stephen A, MD   Brief Narrative:   Patient is a 70-year-old male with past medical history of hypertension, hyperlipidemia, coronary artery disease was admitted as ST elevation MI and cardiac arrest prior to hospital.  Patient underwent emergent cardiac catheterization and PCI with stent to proximal RCA was placed in.  Ejection fraction was noted to be 25 to 30%.  Patient did have prolonged ICU stay for cardiogenic/hemorrhagic shock requiring vasopressors and transfusion and also required Impella..  He suffered from diminished mental status and MRI showed scattered punctate infarctions concerning for anoxic injury.  He also had acute kidney injury and required CRRT and was subsequently transitioned to intermittent hemodialysis.  Patient was extubated on 01/15/2021 and reintubated on 01/20/2021.  Subsequently, patient was extubated on 01/23/2021 and was transferred out of ICU.  Patient remains altered with significant anoxic brain injury.  He also had fever with worsening leukocytosis.  Palliative care consultation was made and healthcare power of attorney has decided not to escalate care but wanting to discuss with the family regarding further goals of care.  Nephrology and cardiology following the patient while in the hospital.  Significant Events: 4/20 Impella removed. MRI brain with numerous micro embolic infarctions. Off pressors.  4/21 lasix drip stopped.  4/22 started CVVH 4/24 CVVH stopped. Failed extubation. Re-intubated.  4/26 Extubated  5/1 Re-intubated 5/4 Extubated, off pressors   Assessment & Plan:   Active Problems:   Cardiogenic shock (HCC)   Hemoptysis   Acute hypoxemic respiratory failure (HCC)   Acute renal failure (HCC)   Encephalopathy acute  Cardiogenic shock.  Currently resolved.  Was on vasopressors and Impella device at some point.  CAD/ Acute Inferior STEMI/acute  HFrEF.   EF of 20 to 25%, patient underwent emergent cardiac catheterization with PCI and drug-eluting stent to the right coronary artery.  On DAPT and statins.  Continue with IV Lasix.  Not on beta-blockers or ARB or Arni due to AKI.  Continue with hydralazine and nitrates.  Status post ventricular fibrillation arrest.   Status post a significant anoxic brain injury.  Poor prognosis.  Palliative care on board.  Family discussing about goals of care.  Patient's current status is DNR.  Fever.  During hospitalization with worsening leukocytosis.  Hemodialysis catheter was removed including Foley catheter.  UA showed some pyuria.  Hemodialysis catheter tip culture has been sent pending report.  Have been repeated as well pending at this time.  Chest x-ray was done yesterday showed minimal bibasilar atelectasis.  Patient has been empirically started on ceftriaxone.  We will continue to monitor closely.  T-max of 99.1 F.  Patient does have significant leukocytosis.  We will continue to trend.  Nonoliguric AKI.   Most likely from acute tubular necrosis and cardiogenic shock.  Patient received CRRT and 1 session of hemodialysis during hospitalization.  No further hemodialysis planned as per nephrology.  Hemodialysis catheter has been removed.  We will continue to monitor BMP.    Anoxic brain injury  -MRI of the brain shows microembolic infarcts and EEG showed triphasic phasic morphology with diffuse encephalopathy.  Patient is not a good candidate for CIR due to poor rehab potential.  Recommending skilled nursing facility placement at this time.  Slightly more alert awake and communicative today.  Hypernatremia. Receiving free water through cortrak tube tube.  Monitor BMP.  Nutritional status.   On cortrak tube tube feeding.  On clears   as per as per speech therapy  Subjective: Today, patient was seen and examined at bedside.  Patient was alert awake and oriented to place only..  Stated no to pain.   Squeezed hands on command.  Objective: Vitals:   01/26/21 0117 01/26/21 0340 01/26/21 0547 01/26/21 0743  BP:  123/77 (!) 143/64 131/70  Pulse:  93  94  Resp:    19  Temp:  99.1 F (37.3 C)  98.5 F (36.9 C)  TempSrc:  Oral  Oral  SpO2:  94%    Weight: 79.3 kg     Height:        Intake/Output Summary (Last 24 hours) at 01/26/2021 0811 Last data filed at 01/26/2021 0300 Gross per 24 hour  Intake 3881 ml  Output 2025 ml  Net 1856 ml   Filed Weights   01/23/21 0348 01/25/21 0404 01/26/21 0117  Weight: 83.5 kg 82 kg 79.3 kg    Physical examination: General:  Average built, not in obvious distress cannula oxygen alert awake and oriented to place.  Followed few commands.  Cortrak tube tube in place. HENT:   No scleral pallor or icterus noted. Oral mucosa is moist.  Chest: Diminished breath sounds bilaterally, coarse breath sounds. CVS: S1 &S2 heard. No murmur.  Regular rate and rhythm. Abdomen: Soft, nontender, nondistended.  Bowel sounds are heard.   Extremities: No cyanosis, clubbing or edema.  Peripheral pulses are palpable. Psych: Alert, followed commands including squeezing hands.  Answering yes or no question  oriented to place. CNS: Moves extremities, alert awake oriented to place, right-sided weakness more than the left, lower extremity weakness more than the upper Skin: Warm and dry.  No rashes noted.   DVT prophylaxis: Lovenox subcu  Code Status: DNR  Family Communication:  None  Disposition Plan:  Status is: Inpatient  Remains inpatient appropriate because:Inpatient level of care appropriate due to severity of illness   Dispo: The patient is from: Home              Anticipated d/c is to: To be determined              Patient currently is not medically stable to d/c.   Difficult to place patient No    Consultants:   PCCM  Cardiology  Nephrology  Procedures:  Intubation and extubation Cardiac catheterization Hemodialysis, CRRT  Antimicrobials:   Ceftriaxone IV  Data Reviewed: I have personally reviewed the following labs and imaging studies   CBC: Recent Labs  Lab 01/20/21 0122 01/20/21 0140 01/21/21 0346 01/24/21 0359 01/25/21 0524  WBC 24.2*  --  22.0* 22.1* 21.6*  NEUTROABS  --   --  19.9*  --   --   HGB 8.2* 7.8* 8.0* 8.4* 8.3*  HCT 24.7* 23.0* 24.8* 26.1* 25.9*  MCV 99.2  --  96.5 97.0 98.9  PLT 451*  --  391 387 319   Basic Metabolic Panel: Recent Labs  Lab 01/20/21 0122 01/20/21 0140 01/22/21 0337 01/23/21 0405 01/24/21 0359 01/25/21 0524 01/26/21 0302  NA 141   < > 142 144 146* 150* 150*  K 4.7   < > 3.7 3.5 3.8 3.6 3.6  CL 103   < > 107 110 111 112* 112*  CO2 26   < > 24 25 25 26 24  GLUCOSE 119*   < > 153* 158* 177* 179* 153*  BUN 62*   < > 94* 102* 111* 122* 120*  CREATININE 2.46*   < > 2.99* 3.00*   2.90* 3.13* 3.00*  CALCIUM 8.1*   < > 8.2* 8.4* 8.6* 8.6* 8.5*  MG 1.9   < > 2.3 2.4 2.3 2.4 2.5*  PHOS 6.2*  --   --   --   --   --   --    < > = values in this interval not displayed.   GFR: Estimated Creatinine Clearance: 25.7 mL/min (A) (by C-G formula based on SCr of 3 mg/dL (H)). Liver Function Tests: Recent Labs  Lab 01/22/21 0337 01/23/21 0405 01/24/21 0359 01/25/21 0524 01/26/21 0302  AST 36 65* 69* 40 80*  ALT 70* 104* 130* 101* 127*  ALKPHOS 108 121 140* 127* 135*  BILITOT 0.7 0.9 0.9 0.8 0.5  PROT 6.3* 6.5 6.5 6.7 6.4*  ALBUMIN 2.3* 2.5* 2.5* 2.4* 2.4*   No results for input(s): LIPASE, AMYLASE in the last 168 hours. Recent Labs  Lab 01/20/21 0518  AMMONIA 27   Coagulation Profile: No results for input(s): INR, PROTIME in the last 168 hours. Cardiac Enzymes: No results for input(s): CKTOTAL, CKMB, CKMBINDEX, TROPONINI in the last 168 hours. BNP (last 3 results) No results for input(s): PROBNP in the last 8760 hours. HbA1C: No results for input(s): HGBA1C in the last 72 hours. CBG: Recent Labs  Lab 01/25/21 1554 01/25/21 2004 01/25/21 2352 01/26/21 0357  01/26/21 0711  GLUCAP 149* 140* 134* 141* 126*   Lipid Profile: No results for input(s): CHOL, HDL, LDLCALC, TRIG, CHOLHDL, LDLDIRECT in the last 72 hours. Thyroid Function Tests: No results for input(s): TSH, T4TOTAL, FREET4, T3FREE, THYROIDAB in the last 72 hours. Anemia Panel: No results for input(s): VITAMINB12, FOLATE, FERRITIN, TIBC, IRON, RETICCTPCT in the last 72 hours. Sepsis Labs: No results for input(s): PROCALCITON, LATICACIDVEN in the last 168 hours.  No results found for this or any previous visit (from the past 240 hour(s)).   Radiology Studies: DG Chest 2 View  Result Date: 01/25/2021 CLINICAL DATA:  Fever. EXAM: CHEST - 2 VIEW COMPARISON:  Chest x-ray dated Jan 20, 2021. FINDINGS: Interval extubation. Unchanged feeding tube and tunneled left internal jugular dialysis catheter. The heart size and mediastinal contours are within normal limits. Normal pulmonary vascularity. Minimal bibasilar atelectasis. No focal consolidation, pleural effusion, or pneumothorax. No acute osseous abnormality. IMPRESSION: 1. Interval extubation.  No active cardiopulmonary disease. Electronically Signed   By: Prosper T Derry M.D.   On: 01/25/2021 12:42    Scheduled Meds: . aspirin  81 mg Per Tube Daily  . chlorhexidine gluconate (MEDLINE KIT)  15 mL Mouth Rinse BID  . Chlorhexidine Gluconate Cloth  6 each Topical Daily  . docusate  100 mg Oral BID  . enoxaparin (LOVENOX) injection  30 mg Subcutaneous Q24H  . feeding supplement (PROSource TF)  90 mL Per Tube BID  . fiber  1 packet Oral BID  . free water  300 mL Per Tube Q2H  . hydrALAZINE  12.5 mg Per Tube Q8H  . insulin aspart  0-9 Units Subcutaneous Q4H  . isosorbide dinitrate  5 mg Per Tube TID  . mouth rinse  15 mL Mouth Rinse q12n4p  . modafinil  100 mg Per Tube Daily  . pantoprazole sodium  40 mg Per Tube Daily  . polyethylene glycol  17 g Per Tube BID  . rosuvastatin  10 mg Per Tube Daily  . ticagrelor  90 mg Per Tube BID    Continuous Infusions: . cefTRIAXone (ROCEPHIN)  IV 1 g (01/25/21 2203)  . feeding supplement (OSMOLITE 1.5 CAL) 1,000   mL (01/24/21 0500)     LOS: 23 days    Laxman Pokhrel, MD [facility] 01/26/2021, 8:11 AM   

## 2021-01-26 NOTE — Progress Notes (Signed)
Physical Therapy Treatment Patient Details Name: [name] MRN: [id number] DOB: 1951-06-28 Today's Date: 01/26/2021    History of Present Illness Pt is 70 yo admitted 4/14 with chest pain and STEMI with bradycardia and hypotension. Pt taken for urgent cath with temporary pacer placed but pt decompensated to cardiac arrest with intubation and CPR. Stent placed and pt with further complication of cardiogenic shock with Impella placed. 4/18 seizure like activity, Impella removed 4/20, 4/20 MRI with scattered micro embolic infarctions. Extubated 4/24 and reintubated 2 hrs later. Extubated 4/26. Reintubated 01/20/21. Extubated 01/23/21.  PMHx: HTN, HLD, CAD    PT Comments    Pt awake and speaking frequently during session but still following only ~50% of simple, one step commands due to aphasia. Needs increased time for processing and responding verbally and physically. Pt came to EOB with mod A , +2 for safety from behind. He was able to maintain sitting EOB with close supervision and maintains kyphotic posture with posterior pelvic tilt unless upright sitting actively facilitated. Pt stood 2x from EOB with max A +2. Fatigued very quickly with this. Pt expressed discomfort R shoulder with PROM. Continue to recommend SNF as most appropriate option for pt at d/c. PT will continue to follow.    Follow Up Recommendations  Supervision/Assistance - 24 hour;SNF     Equipment Recommendations  Wheelchair cushion (measurements PT);3in1 (PT);Wheelchair (measurements PT);Hospital bed    Recommendations for Other Services       Precautions / Restrictions Precautions Precautions: Fall Precaution Comments: globally aphasic, cortrak Restrictions Weight Bearing Restrictions: No    Mobility  Bed Mobility Overal bed mobility: Needs Assistance Bed Mobility: Rolling;Sit to Supine;Sidelying to Sit Rolling: Mod assist Sidelying to sit: Mod assist;+2 for safety/equipment   Sit to supine: +2 for physical  assistance;Max assist   General bed mobility comments: pt's L hand guided to R rail, pt able to grasp on command. Mod A for LE's off bed. Mod HHA for SL to sit, with pt pulling self up using therapist's hand. +2 behind for safety. Pt fatigued with return to supine from sitting and needed max A +2    Transfers Overall transfer level: Needs assistance Equipment used: None Transfers: Sit to/from Stand Sit to Stand: +2 physical assistance;From elevated surface;Max assist         General transfer comment: pt stood first time with HHA on each side, max A +2 for power up. Pt able to come to full standing but maintained <2 secs before quickly returning to sitting. Second time therapist stood in front of pt with +2 on L side for additional support. Pt again stood with max A +2 but able to maintain >5 secs with assisted R knee control and full support.  Ambulation/Gait             General Gait Details: unable   Stairs             Wheelchair Mobility    Modified Rankin (Stroke Patients Only) Modified Rankin (Stroke Patients Only) Pre-Morbid Rankin Score: No symptoms Modified Rankin: Severe disability     Balance Overall balance assessment: Needs assistance Sitting-balance support: Feet supported;No upper extremity supported Sitting balance-Leahy Scale: Fair Sitting balance - Comments: close supervision in sitting but no physical assist needed   Standing balance support: Bilateral upper extremity supported Standing balance-Leahy Scale: Zero Standing balance comment: max A +2 to maintain standing  Cognition Arousal/Alertness: Awake/alert Behavior During Therapy: Flat affect Overall Cognitive Status: Difficult to assess Area of Impairment: Orientation;Attention;Memory;Following commands;Awareness;Problem solving;Safety/judgement                 Orientation Level: Situation;Time;Place Current Attention Level: Focused Memory:  Decreased recall of precautions;Decreased short-term memory Following Commands: Follows one step commands inconsistently;Follows one step commands with increased time Safety/Judgement: Decreased awareness of deficits Awareness: Intellectual Problem Solving: Slow processing;Decreased initiation General Comments: pt followed smple, one step commands ~50% of time and verbalized occasionally, also laughed several times      Exercises General Exercises - Upper Extremity Shoulder Flexion: Both;10 reps;Supine;PROM General Exercises - Lower Extremity Short Arc Quad:  (with fatigued required AAROM on R) Long Arc Quad: Both;Seated;AROM;5 reps Other Exercises Other Exercises: chest stretch in supported sitting with cervical extension to neutral    General Comments General comments (skin integrity, edema, etc.): VSS on RA      Pertinent Vitals/Pain Pain Assessment: Faces Faces Pain Scale: Hurts little more Pain Location: R shoulder with PROM Pain Descriptors / Indicators: Grimacing Pain Intervention(s): Limited activity within patient's tolerance;Monitored during session    Home Living                      Prior Function            PT Goals (current goals can now be found in the care plan section) Acute Rehab PT Goals Patient Stated Goal: unable PT Goal Formulation: Patient unable to participate in goal setting Time For Goal Achievement: 01/29/21 Potential to Achieve Goals: Fair Progress towards PT goals: Progressing toward goals    Frequency    Min 3X/week      PT Plan Current plan remains appropriate    Co-evaluation              AM-PAC PT "6 Clicks" Mobility   Outcome Measure  Help needed turning from your back to your side while in a flat bed without using bedrails?: A Lot Help needed moving from lying on your back to sitting on the side of a flat bed without using bedrails?: A Lot Help needed moving to and from a bed to a chair (including a  wheelchair)?: Total Help needed standing up from a chair using your arms (e.g., wheelchair or bedside chair)?: Total Help needed to walk in hospital room?: Total Help needed climbing 3-5 steps with a railing? : Total 6 Click Score: 8    End of Session Equipment Utilized During Treatment: Gait belt Activity Tolerance: Patient limited by fatigue Patient left: with call bell/phone within reach;in bed;with bed alarm set Nurse Communication: Mobility status PT Visit Diagnosis: Unsteadiness on feet (R26.81);Muscle weakness (generalized) (M62.81);Hemiplegia and hemiparesis Hemiplegia - Right/Left: Right Hemiplegia - dominant/non-dominant: Non-dominant Hemiplegia - caused by: Cerebral infarction     Time: 1696-7893 PT Time Calculation (min) (ACUTE ONLY): 28 min  Charges:  $Therapeutic Activity: 23-37 mins                     Leighton Roach, PT  Acute Rehab Services  Pager [phone] Office [place] 01/26/2021, 9:55 AM

## 2021-01-26 NOTE — Progress Notes (Signed)
[facility] NEPHROLOGY PROGRESS NOTE  Assessment/ Plan: Pt is a 70 y.o. yo male with history of HTN, HLD, CAD presented with chest pain found to have STEMI.  He underwent left heart cath, placement of Impella device, had V. fib arrest, mechanical ventilation for respiratory failure and AKI.  #Acute kidney injury due to cardiogenic/hemorrhagic shock.  UA bland and kidney ultrasound unremarkable.  Required CRRT from 4/22-4/24.  Patient is nonoliguric and volume status acceptable.  Received intermittent hemodialysis on 4/30 for clearance.  After conversations with palliative medicine decision is made for no further dialysis treatments which is very reasonable .  Azotemia is a combination of low GFR, diuresis, high protein intake.  HD catheter has been removed   #Hypernatremia due to increase free water excretion and recovering AKI.  Mild CTM, can add free water as necessary  #CAD/acute STEMI required emergent cath with stent placement.  EF 20 to 25%.  AHF following.  #V. fib arrest/cardiogenic shock, acute systolic CHF: Arrest in the setting of acute MI.  Impella was removed.  Now off of pressors or inotropes.  Volume status is acceptable.  Diuretics per cardiology  #Ventilator dependent respiratory failure: Extubated 01/23/21  #Anemia due to acute blood loss: Required multiple blood transfusion.  Transfuse as needed.  #Hypokalemia: Potassium level improved.  #Encephalopathy: Monitor  Subjective:   Palliative notes reviewed: Family asked for no further dialysis treatments  HD catheter removed because of fevers  On room air 2 L urine output yesterday  Patient minimally interactive  BUN stable 120, creatinine 3.0, K3.6, sodium 150  Objective Vital signs in last 24 hours: Vitals:   01/26/21 0117 01/26/21 0340 01/26/21 0547 01/26/21 0743  BP:  123/77 (!) 143/64 131/70  Pulse:  93  94  Resp:    19  Temp:  99.1 F (37.3 C)  98.5 F (36.9 C)  TempSrc:  Oral  Oral   SpO2:  94%    Weight: 79.3 kg     Height:       Weight change: -2.7 kg  Intake/Output Summary (Last 24 hours) at 01/26/2021 0920 Last data filed at 01/26/2021 0800 Gross per 24 hour  Intake 3881 ml  Output 2125 ml  Net 1756 ml       Labs: Basic Metabolic Panel: Recent Labs  Lab 01/20/21 0122 01/20/21 0140 01/24/21 0359 01/25/21 0524 01/26/21 0302  NA 141   < > 146* 150* 150*  K 4.7   < > 3.8 3.6 3.6  CL 103   < > 111 112* 112*  CO2 26   < > _0 GLUCOSE 119*   < > 177* 179* 153*  BUN 62*   < > 111* 122* 120*  CREATININE 2.46*   < > 2.90* 3.13* 3.00*  CALCIUM 8.1*   < > 8.6* 8.6* 8.5*  PHOS 6.2*  --   --   --   --    < > = values in this interval not displayed.   Liver Function Tests: Recent Labs  Lab 01/24/21 0359 01/25/21 0524 01/26/21 0302  AST 69* 40 80*  ALT 130* 101* 127*  ALKPHOS 140* 127* 135*  BILITOT 0.9 0.8 0.5  PROT 6.5 6.7 6.4*  ALBUMIN 2.5* 2.4* 2.4*   No results for input(s): LIPASE, AMYLASE in the last 168 hours. Recent Labs  Lab 01/20/21 0518  AMMONIA 27   CBC: Recent Labs  Lab 01/20/21 0122 01/20/21 0140 01/21/21 0346 01/24/21 0359 01/25/21 0524  WBC 24.2*  --  22.0* 22.1* 21.6*  NEUTROABS  --   --  19.9*  --   --   HGB 8.2*   < > 8.0* 8.4* 8.3*  HCT 24.7*   < > 24.8* 26.1* 25.9*  MCV 99.2  --  96.5 97.0 98.9  PLT 451*  --  391 387 319   < > = values in this interval not displayed.   Cardiac Enzymes: No results for input(s): CKTOTAL, CKMB, CKMBINDEX, TROPONINI in the last 168 hours. CBG: Recent Labs  Lab 01/25/21 1554 01/25/21 2004 01/25/21 2352 01/26/21 0357 01/26/21 0711  GLUCAP 149* 140* 134* 141* 126*    Iron Studies:  No results for input(s): IRON, TIBC, TRANSFERRIN, FERRITIN in the last 72 hours. Studies/Results: DG Chest 2 View  Result Date: 01/25/2021 CLINICAL DATA:  Fever. EXAM: CHEST - 2 VIEW COMPARISON:  Chest x-ray dated Jan 20, 2021. FINDINGS: Interval extubation. Unchanged feeding tube and  tunneled left internal jugular dialysis catheter. The heart size and mediastinal contours are within normal limits. Normal pulmonary vascularity. Minimal bibasilar atelectasis. No focal consolidation, pleural effusion, or pneumothorax. No acute osseous abnormality. IMPRESSION: 1. Interval extubation.  No active cardiopulmonary disease. Electronically Signed   By: Titus Dubin M.D.   On: 01/25/2021 12:42    Medications: Infusions: . cefTRIAXone (ROCEPHIN)  IV 1 g (01/25/21 2203)  . feeding supplement (OSMOLITE 1.5 CAL) 1,000 mL (01/24/21 0500)    Scheduled Medications: . aspirin  81 mg Per Tube Daily  . chlorhexidine gluconate (MEDLINE KIT)  15 mL Mouth Rinse BID  . Chlorhexidine Gluconate Cloth  6 each Topical Daily  . docusate  100 mg Oral BID  . enoxaparin (LOVENOX) injection  30 mg Subcutaneous Q24H  . feeding supplement (PROSource TF)  90 mL Per Tube BID  . fiber  1 packet Oral BID  . free water  300 mL Per Tube Q2H  . hydrALAZINE  12.5 mg Per Tube Q8H  . insulin aspart  0-9 Units Subcutaneous Q4H  . isosorbide dinitrate  5 mg Per Tube TID  . mouth rinse  15 mL Mouth Rinse q12n4p  . modafinil  100 mg Per Tube Daily  . pantoprazole sodium  40 mg Per Tube Daily  . polyethylene glycol  17 g Per Tube BID  . rosuvastatin  10 mg Per Tube Daily  . ticagrelor  90 mg Per Tube BID    have reviewed scheduled and prn medications.  Physical Exam: General: Critically ill looking male , on [place], eyes open, nods to questions Heart:RRR, s1s2 nl, no rubs Lungs: Coarse breath sound bilateral Abdomen:soft,  non-distended Extremities:No edema  Georgette Helmer B Glendell Fouse 01/26/2021,9:19 AM  LOS: 23 days

## 2021-01-26 NOTE — Progress Notes (Signed)
Shift note: Pt awake and alert at start of shift. Able to follow simple commands. Throughout the shift pt able to answer simple questions and speak in sentences. C/o pain once during shift but unable to specify where. Relieved by PRN Tylenol. VSS on RA. Tube feeding and water flushes running as ordered. Encouraged pt to try PO intake as per clear liquid diet ordered. Pt was able to take some bites of Jello and popsicle and swallowed well. Will continue to monitor.

## 2021-01-27 ENCOUNTER — Inpatient Hospital Stay (HOSPITAL_COMMUNITY): Payer: Medicare PPO

## 2021-01-27 DIAGNOSIS — Z7189 Other specified counseling: Secondary | ICD-10-CM | POA: Diagnosis not present

## 2021-01-27 DIAGNOSIS — Z515 Encounter for palliative care: Secondary | ICD-10-CM | POA: Diagnosis not present

## 2021-01-27 DIAGNOSIS — N170 Acute kidney failure with tubular necrosis: Secondary | ICD-10-CM | POA: Diagnosis not present

## 2021-01-27 DIAGNOSIS — G9340 Encephalopathy, unspecified: Secondary | ICD-10-CM | POA: Diagnosis not present

## 2021-01-27 DIAGNOSIS — J9601 Acute respiratory failure with hypoxia: Secondary | ICD-10-CM | POA: Diagnosis not present

## 2021-01-27 DIAGNOSIS — R570 Cardiogenic shock: Secondary | ICD-10-CM | POA: Diagnosis not present

## 2021-01-27 LAB — COMPREHENSIVE METABOLIC PANEL
ALT: 203 U/L — ABNORMAL HIGH (ref 0–44)
AST: 122 U/L — ABNORMAL HIGH (ref 15–41)
Albumin: 2.4 g/dL — ABNORMAL LOW (ref 3.5–5.0)
Alkaline Phosphatase: 119 U/L (ref 38–126)
Anion gap: 9 (ref 5–15)
BUN: 100 mg/dL — ABNORMAL HIGH (ref 8–23)
CO2: 23 mmol/L (ref 22–32)
Calcium: 8.1 mg/dL — ABNORMAL LOW (ref 8.9–10.3)
Chloride: 113 mmol/L — ABNORMAL HIGH (ref 98–111)
Creatinine, Ser: 2.41 mg/dL — ABNORMAL HIGH (ref 0.61–1.24)
GFR, Estimated: 28 mL/min — ABNORMAL LOW (ref >60)
Glucose, Bld: 142 mg/dL — ABNORMAL HIGH (ref 70–99)
Potassium: 3.7 mmol/L (ref 3.5–5.1)
Sodium: 145 mmol/L (ref 135–145)
Total Bilirubin: 0.6 mg/dL (ref 0.3–1.2)
Total Protein: 6.3 g/dL — ABNORMAL LOW (ref 6.5–8.1)

## 2021-01-27 LAB — CBC
HCT: 26.6 % — ABNORMAL LOW (ref 39.0–52.0)
Hemoglobin: 8.6 g/dL — ABNORMAL LOW (ref 13.0–17.0)
MCH: 33.2 pg (ref 26.0–34.0)
MCHC: 32.3 g/dL (ref 30.0–36.0)
MCV: 102.7 fL — ABNORMAL HIGH (ref 80.0–100.0)
Platelets: 285 10*3/uL (ref 150–400)
RBC: 2.59 MIL/uL — ABNORMAL LOW (ref 4.22–5.81)
RDW: 17.6 % — ABNORMAL HIGH (ref 11.5–15.5)
WBC: 12.4 10*3/uL — ABNORMAL HIGH (ref 4.0–10.5)
nRBC: 0 % (ref 0.0–0.2)

## 2021-01-27 LAB — GLUCOSE, CAPILLARY
Glucose-Capillary: 126 mg/dL — ABNORMAL HIGH (ref 70–99)
Glucose-Capillary: 96 mg/dL (ref 70–99)
Glucose-Capillary: 119 mg/dL — ABNORMAL HIGH (ref 70–99)
Glucose-Capillary: 113 mg/dL — ABNORMAL HIGH (ref 70–99)
Glucose-Capillary: 98 mg/dL (ref 70–99)

## 2021-01-27 LAB — MAGNESIUM: Magnesium: 2.3 mg/dL (ref 1.7–2.4)

## 2021-01-27 LAB — URINE CULTURE: Culture: 80000 — AB

## 2021-01-27 MED ORDER — SODIUM CHLORIDE 0.9 % IV SOLN
2.0000 g | Freq: Two times a day (BID) | INTRAVENOUS | Status: AC
  Administered 2021-01-28 – 2021-02-03 (×15): 2 g via INTRAVENOUS
  Filled 2021-01-27 (×16): qty 2

## 2021-01-27 MED ORDER — SODIUM CHLORIDE 0.9 % IV SOLN
2.0000 g | Freq: Two times a day (BID) | INTRAVENOUS | Status: DC
  Filled 2021-01-27 (×2): qty 2

## 2021-01-27 NOTE — Progress Notes (Signed)
Modified Barium Swallow Progress Note  Patient Details  Name: [name] MRN: [id number] Date of Birth: June 07, 1951  Today's Date: 01/27/2021  Modified Barium Swallow completed.  Full report located under Chart Review in the Imaging Section.  Brief recommendations include the following:  Clinical Impression  MBS was completed using thin liquids via spoon and cup, nectar thick liquids via spoon and cup, pureed material and dual textured solids.  He presented with an oral and pharyngeal dysphagia that was likely at least partially impacted by his mentation.  Intermittent issues with airway protection were noted but they were not consistent and the pentration was trace.  Suggest patient begin a dysphagia 1 (puree) diet with thin liquids.  He should take small, single sips and he may require cues to swallow at times.  Oral cavity needs to be checked to ensure he has cleared material.  Oral care needs to be diligent with a toothbrush and toothpaste at least 2-3 times per day.  ST will follow during acute stay to initiate swallowing therapy, possible diet advancement and therapeutic diet tolerance.  He may require ST follow up at the next level of care.  See below for information regarding swallowing physiology.    ORAL PHASE   -Slow/decreased lingual motion leading to a delay in A/P transport of boluses.  At times he was noted to orally hold material requiring cues to swallow.  This was particularly evident during pureed trials.   -Good lingual control during directed bolus hold with little premature spillage noted.   -Slow but functional mastication of dual textured solids.    PHARYNGEAL PHASE   -Timely swallow trigger.  Material noted in the vallecula prior to the swallow trigger. -Intermittently reduced laryngeal elevation.   -Reduced base of tongue retraction and pharyngeal stripping led to decreased epiglottic inversion and pharyngeal residue in the vallecula.  Mild-moderate residue was seen  given thin and nectar thick liquids as well as pureed material.  Severe vallecula residue was seen given dual textured solids.  He was not sensate to this residue.  Cued dry swallow was not successful to clear this material.  Liquid wash was able to clear it using nectar thick liquids.   -Trace penetration after the swallow given thin liquids via spoon sips.  There may have been some trace penetration during the swallow given thin liquids via cup sip on 1/2 trials, however, it may have been residue left from the previously penetrated thin liquids via spoon sips.   -Trace penetration during the swallow given 2/3 trials of nectar thick liquids via cup sips.    ESOPHAGEAL PHASE   -Sweep revealed it slow to clear.   Swallow Evaluation Recommendations       SLP Diet Recommendations: Dysphagia 1 (Puree) solids;Thin liquid   Liquid Administration via: Cup   Medication Administration: Crushed with puree   Supervision: Full assist for feeding   Compensations: Slow rate;Small sips/bites;Follow solids with liquid   Postural Changes: Remain semi-upright after after feeds/meals (Comment);Seated upright at 90 degrees   Oral Care Recommendations: Oral care BID;Oral care before and after PO        Lamar Sprinkles 01/27/2021,1:26 PM

## 2021-01-27 NOTE — Progress Notes (Signed)
   Palliative Medicine Inpatient Follow Up Note  Reason for consult: Discuss goals of care  HPI:  Per intake H&P --> [name] "[name]" Haberer has been admitted to [facility] for the past 16 days.  He has had a very complicated hospitalization inclusive of an intensive care unit stay for cardiogenic shock he also went into V. fib arrest and required multiple CPR episodes.  He has been medically optimized and is now on the cardiac unit though there is concern regarding his his MRI scan for anoxic brain injury.  Palliative care has been asked to get involved in the setting of suspected anoxic brain injury to further discuss goals of care with patient's power of attorney, Merry Proud.  Today's Discussion (01/27/2021):  *Please note that this is a verbal dictation therefore any spelling or grammatical errors are due to the "[employer]" system interpretation.  Chart reviewed this morning.  Spoke to patients RN who shares with me that [facility]ius worked with PT yesterday and was following commands.  I met at bedside with [name] this morning. He was more responsive and able to participate in following all of my commands.    Speech saw the patient today and recommend an MBS in the oncoming days.   Questions and concerns addressed   Objective Assessment: Vital Signs Vitals:   01/27/21 0400 01/27/21 0756  BP: 132/73 131/75  Pulse: 79 75  Resp: 20 19  Temp: 97.8 F (36.6 C) 97.6 F (36.4 C)  SpO2: 99%     Intake/Output Summary (Last 24 hours) at 01/27/2021 1012 Last data filed at 01/27/2021 0715 Gross per 24 hour  Intake 1927 ml  Output 2528 ml  Net -601 ml   Last Weight  Most recent update: 01/27/2021  4:36 AM   Weight  81.2 kg (179 lb 0.2 oz)           Gen: Elderly ill-appearing male HEENT: Core-track, dry mucous membranes CV: regular rate and rhythm PULM: 2LPM [place] ABD: soft/nontender/nondistended  EXT: No edema  Neuro: Opens eyes, responds verbally slowly, follows most  commands   SUMMARY OF RECOMMENDATIONS DNAR/DNI, no more HD  Advance directives are in Vyn[place]  Continue present course of treatment  PT evaluated Cathy yesterday - Rec. SNR, Speech therapy saw Tim today and have written for an MBS depending on their evaluation further conversations will need to take place regarding [place] and potential PEG placement  Patients HCPOA, Merry Proud will communicate with the care team on Monday though if Epifanio declines prior Merry Proud will be reachable and able to help make decisions at that time  Ongoing palliative care support, I will not be present tomorrow though I will recommend one of my colleagues follows up  Time Spent: 25 Greater than 50% of the time was spent in counseling and coordination of care ______________________________________________________________________________________ [place] Team Team Cell Phone: [phone] Please utilize secure chat with additional questions, if there is no response within 30 minutes please call the above phone number  Palliative Medicine Team providers are available by phone from 7am to 7pm daily and can be reached through the team cell phone.  Should this patient require assistance outside of these hours, please call the patient's attending physician.

## 2021-01-27 NOTE — Progress Notes (Signed)
PROGRESS NOTE  ICU transfer  [name]  UVO:[id number] DOB: July 25, 1951 DOA: 01/03/2021 PCP: [name], MD   Brief Narrative:   Patient is a 70 year old male with past medical history of hypertension, hyperlipidemia, coronary artery disease was admitted as ST elevation MI and cardiac arrest prior to hospital.  Patient underwent emergent cardiac catheterization and PCI with stent to proximal RCA was placed in.  Ejection fraction was noted to be 25 to 30%.  Patient did have prolonged ICU stay for cardiogenic/hemorrhagic shock requiring vasopressors and transfusion and also required Impella..  He suffered from diminished mental status and MRI showed scattered punctate infarctions concerning for anoxic injury.  He also had acute kidney injury and required CRRT and was subsequently transitioned to intermittent hemodialysis.  Patient was extubated on 01/15/2021 and reintubated on 01/20/2021.  Subsequently, patient was extubated on 01/23/2021 and was transferred out of ICU.  Patient remains altered with significant anoxic brain injury.  He also had fever with worsening leukocytosis.  Palliative care consultation was made and healthcare power of attorney has decided not to escalate care but wanting to discuss with the family regarding further goals of care.  Nephrology and cardiology following the patient while in the hospital.   Significant Events: 4/20 Impella removed. MRI brain with numerous micro embolic infarctions. Off pressors.  4/21 lasix drip stopped.  4/22 started CVVH 4/24 CVVH stopped. Failed extubation. Re-intubated.  4/26 Extubated  5/1 Re-intubated 5/4 Extubated, off pressors    Assessment & Plan:   Active Problems:   Cardiogenic shock (HCC)   Hemoptysis   Acute hypoxemic respiratory failure (HCC)   Acute renal failure (HCC)   Encephalopathy acute  Cardiogenic shock.  Currently resolved.  Was on vasopressors and Impella device at some point.  CAD/ Acute Inferior STEMI/acute  HFrEF.  Left ventricular EF of 20 to 25%, patient underwent emergent cardiac catheterization with PCI and drug-eluting stent to the right coronary artery.  continue DAPT and statins.  Continue with IV Lasix.  Not on beta-blockers or ARB or ARNI due to AKI.  Continue with hydralazine and nitrates.  Status post ventricular fibrillation arrest.   Status post a significant anoxic brain injury.   Palliative care on board.  Family discussing about goals of care.  Patient's current status is DNR. Some alertness and response noted.   Fever with leukocytosis..  Hemodialysis catheter was removed including Foley catheter.  UA showed some pyuria.  Hemodialysis catheter tip culture has been sent and negative in last 24 hours.    Chest x-ray showed minimal bibasilar atelectasis.  Patient has been empirically started on ceftriaxone.  We will continue to monitor closely.  T-max of 99.1 F.  Will trend leukocytosis  Nonoliguric AKI.   Most likely from acute tubular necrosis and cardiogenic shock.  Patient received CRRT and 1 session of hemodialysis during hospitalization.  No further hemodialysis planned as per nephrology.  Hemodialysis catheter has been removed.  We will continue to monitor BMP.    Anoxic brain injury  -MRI of the brain showed microembolic infarcts and EEG showed triphasic  morphology with diffuse encephalopathy.  Slightly more alert awake and communicative and could be considered for rehab again.  Hypernatremia. Receiving free water through cortrak tube tube.  Monitor BMP.  Nutritional status.   On cortrak tube tube feeding.  On clears as per as per speech therapy. Advance as patient progresses.  Subjective: Today, patient was seen and examined at bedside.  More alert and awake. Communicative, denies pain  Objective: Vitals:   01/26/21 2001 01/26/21 2330 01/27/21 0400 01/27/21 0756  BP: 132/68 126/67 132/73 131/75  Pulse: 75 78 79 75  Resp: 20 18 20 19   Temp: 98.2 F (36.8 C) 97.7  F (36.5 C) 97.8 F (36.6 C) 97.6 F (36.4 C)  TempSrc: Oral Oral Oral Oral  SpO2: 97% 95% 99%   Weight:   81.2 kg   Height:        Intake/Output Summary (Last 24 hours) at 01/27/2021 0815 Last data filed at 01/27/2021 0715 Gross per 24 hour  Intake 1927 ml  Output 2528 ml  Net -601 ml   Filed Weights   01/25/21 0404 01/26/21 0117 01/27/21 0400  Weight: 82 kg 79.3 kg 81.2 kg    Physical examination:  General:  Average built, not in obvious distress cannula oxygen alert awake  Followed few commands.  Cortrak tube tube in place. HENT:   No scleral pallor or icterus noted. Oral mucosa is moist.  Chest: Diminished breath sounds bilaterally, coarse breath sounds CVS: S1 &S2 heard. No murmur.  Regular rate and rhythm. Abdomen: Soft, nontender, nondistended.  Bowel sounds are heard.   Extremities: No cyanosis, clubbing or edema.  Peripheral pulses are palpable. Psych: Alert follows few commands. CNS: Moves extremities, alert awake oriented to place, right-sided weakness more than the left, lower extremity weakness more than the upper Skin: Warm and dry.  No rashes noted.   DVT prophylaxis: Lovenox subcu  Code Status: DNR  Family Communication:  None, palliative care communicating with family  Disposition Plan:  Status is: Inpatient  Remains inpatient appropriate because:Inpatient level of care appropriate due to severity of illness   Dispo: The patient is from: Home              Anticipated d/c is to:  To be determined , will reconsider rehab potential              Patient currently is not medically stable to d/c.   Difficult to place patient No    Consultants:   PCCM  Cardiology  Nephrology  Procedures:  Intubation and extubation Cardiac catheterization Hemodialysis, CRRT  Antimicrobials:  Ceftriaxone IV  Data Reviewed: I have personally reviewed the following labs and imaging studies   CBC: Recent Labs  Lab 01/21/21 0346 01/24/21 0359 01/25/21 0524   WBC 22.0* 22.1* 21.6*  NEUTROABS 19.9*  --   --   HGB 8.0* 8.4* 8.3*  HCT 24.8* 26.1* 25.9*  MCV 96.5 97.0 98.9  PLT 391 387 480   Basic Metabolic Panel: Recent Labs  Lab 01/22/21 0337 01/23/21 0405 01/24/21 0359 01/25/21 0524 01/26/21 0302 01/27/21 0202  NA 142 144 146* 150* 150*  --   K 3.7 3.5 3.8 3.6 3.6  --   CL 107 110 111 112* 112*  --   CO2 24 25 25 26 24   --   GLUCOSE 153* 158* 177* 179* 153*  --   BUN 94* 102* 111* 122* 120*  --   CREATININE 2.99* 3.00* 2.90* 3.13* 3.00*  --   CALCIUM 8.2* 8.4* 8.6* 8.6* 8.5*  --   MG 2.3 2.4 2.3 2.4 2.5* 2.3   GFR: Estimated Creatinine Clearance: 25.9 mL/min (A) (by C-G formula based on SCr of 3 mg/dL (H)). Liver Function Tests: Recent Labs  Lab 01/22/21 0337 01/23/21 0405 01/24/21 0359 01/25/21 0524 01/26/21 0302  AST 36 65* 69* 40 80*  ALT 70* 104* 130* 101* 127*  ALKPHOS 108 121 140* 127* 135*  BILITOT 0.7 0.9 0.9 0.8 0.5  PROT 6.3* 6.5 6.5 6.7 6.4*  ALBUMIN 2.3* 2.5* 2.5* 2.4* 2.4*   No results for input(s): LIPASE, AMYLASE in the last 168 hours. No results for input(s): AMMONIA in the last 168 hours. Coagulation Profile: No results for input(s): INR, PROTIME in the last 168 hours. Cardiac Enzymes: No results for input(s): CKTOTAL, CKMB, CKMBINDEX, TROPONINI in the last 168 hours. BNP (last 3 results) No results for input(s): PROBNP in the last 8760 hours. HbA1C: No results for input(s): HGBA1C in the last 72 hours. CBG: Recent Labs  Lab 01/26/21 1642 01/26/21 1951 01/26/21 2324 01/27/21 0415 01/27/21 0759  GLUCAP 135* 148* 114* 126* 96   Lipid Profile: No results for input(s): CHOL, HDL, LDLCALC, TRIG, CHOLHDL, LDLDIRECT in the last 72 hours. Thyroid Function Tests: No results for input(s): TSH, T4TOTAL, FREET4, T3FREE, THYROIDAB in the last 72 hours. Anemia Panel: No results for input(s): VITAMINB12, FOLATE, FERRITIN, TIBC, IRON, RETICCTPCT in the last 72 hours. Sepsis Labs: No results for  input(s): PROCALCITON, LATICACIDVEN in the last 168 hours.  Recent Results (from the past 240 hour(s))  Culture, Urine     Status: Abnormal (Preliminary result)   Collection Time: 01/25/21  8:48 AM   Specimen: Urine, Catheterized  Result Value Ref Range Status   Specimen Description URINE, CATHETERIZED  Final   Special Requests NONE  Final   Culture (A)  Final    80,000 COLONIES/mL GRAM NEGATIVE RODS IDENTIFICATION AND SUSCEPTIBILITIES TO FOLLOW Performed at [facility], [address] [place], [place] [address]    Report Status PENDING  Incomplete  Culture, blood (routine x 2)     Status: None (Preliminary result)   Collection Time: 01/25/21 10:53 AM   Specimen: BLOOD LEFT FOREARM  Result Value Ref Range Status   Specimen Description BLOOD LEFT FOREARM  Final   Special Requests   Final    BOTTLES DRAWN AEROBIC ONLY Blood Culture results may not be optimal due to an excessive volume of blood received in culture bottles   Culture   Final    NO GROWTH 1 DAY Performed at [facility], [place] [address], [place], [place] [address]    Report Status PENDING  Incomplete  Culture, blood (routine x 2)     Status: None (Preliminary result)   Collection Time: 01/25/21 10:53 AM   Specimen: BLOOD RIGHT FOREARM  Result Value Ref Range Status   Specimen Description BLOOD RIGHT FOREARM  Final   Special Requests   Final    BOTTLES DRAWN AEROBIC ONLY Blood Culture adequate volume   Culture   Final    NO GROWTH 1 DAY Performed at [facility], [place] [address], [place], [place] [address]    Report Status PENDING  Incomplete  Cath Tip Culture     Status: None (Preliminary result)   Collection Time: 01/25/21  8:00 PM   Specimen: Catheter Tip; Other  Result Value Ref Range Status   Specimen Description CATH TIP  Final   Special Requests NONE  Final   Culture   Final    NO GROWTH < 24 HOURS Performed at [facility], [place] [address] [place], [place] [address]    Report  Status PENDING  Incomplete     Radiology Studies: DG Chest 2 View  Result Date: 01/25/2021 CLINICAL DATA:  Fever. EXAM: CHEST - 2 VIEW COMPARISON:  Chest x-ray dated Jan 20, 2021. FINDINGS: Interval extubation. Unchanged feeding tube  and tunneled left internal jugular dialysis catheter. The heart size and mediastinal contours are within normal limits. Normal pulmonary vascularity. Minimal bibasilar atelectasis. No focal consolidation, pleural effusion, or pneumothorax. No acute osseous abnormality. IMPRESSION: 1. Interval extubation.  No active cardiopulmonary disease. Electronically Signed   By: Titus Dubin M.D.   On: 01/25/2021 12:42    Scheduled Meds: . aspirin  81 mg Per Tube Daily  . chlorhexidine gluconate (MEDLINE KIT)  15 mL Mouth Rinse BID  . Chlorhexidine Gluconate Cloth  6 each Topical Daily  . docusate  100 mg Oral BID  . enoxaparin (LOVENOX) injection  30 mg Subcutaneous Q24H  . feeding supplement (PROSource TF)  90 mL Per Tube BID  . fiber  1 packet Oral BID  . free water  300 mL Per Tube Q2H  . hydrALAZINE  12.5 mg Per Tube Q8H  . insulin aspart  0-9 Units Subcutaneous Q4H  . isosorbide dinitrate  5 mg Per Tube TID  . mouth rinse  15 mL Mouth Rinse q12n4p  . modafinil  100 mg Per Tube Daily  . pantoprazole sodium  40 mg Per Tube Daily  . polyethylene glycol  17 g Per Tube BID  . rosuvastatin  10 mg Per Tube Daily  . ticagrelor  90 mg Per Tube BID   Continuous Infusions: . cefTRIAXone (ROCEPHIN)  IV Stopped (01/27/21 0021)  . feeding supplement (OSMOLITE 1.5 CAL) 60 mL/hr at 01/26/21 1800     LOS: 24 days    Flora Lipps, MD [facility] 01/27/2021, 8:15 AM

## 2021-01-27 NOTE — Evaluation (Signed)
Clinical/Bedside Swallow Evaluation Patient Details  Name: [name] MRN: [id number] Date of Birth: 12-30-1950  Today's Date: 01/27/2021 Time: SLP Start Time (ACUTE ONLY): 27 SLP Stop Time (ACUTE ONLY): 0935 SLP Time Calculation (min) (ACUTE ONLY): 15 min  Past Medical History:  Past Medical History:  Diagnosis Date  . Anxiety   . Arthritis   . Cancer ([place]) 2010   basal cell ca, head  . Cataract    bilateral  . Depression   . ED (erectile dysfunction)   . GERD (gastroesophageal reflux disease)   . Hyperlipidemia   . Hypertension   . Kidney stone 07-29-11   passed   . 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 Vitreous floater    left eye, sees Dr. Dawna Part at [facility]    Past Surgical History:  Past Surgical History:  Procedure Laterality Date  . BASAL CELL CARCINOMA EXCISION     removed from scalp  . COLONOSCOPY  07/03/2017   per Dr. Havery Moros, sessile serrated polyp, repeat in 5 yrs   . HERNIA REPAIR    . LEFT HEART CATH AND CORONARY ANGIOGRAPHY N/A 01/03/2021   Procedure: LEFT HEART CATH AND CORONARY ANGIOGRAPHY;  Surgeon: Troy Sine, MD;  Location: [facility];  Service: Cardiovascular;  Laterality: N/A;  . RIGHT HEART CATH N/A 01/03/2021   Procedure: RIGHT HEART CATH;  Surgeon: Jolaine Artist, MD;  Location: [facility];  Service: Cardiovascular;  Laterality: N/A;   HPI:  Pt is 70 y.o. with PMH of  HTN, HLD, CAD, admitted with chest pain and STEMI with bradycardia and hypotension. Pt taken for urgent cath with temporary pacer placed but pt decompensated to cardiac arrest with intubation and CPR. Stent placed and pt with further complication of cardiogenic shock with Impella placed. 4/18 seizure like activity, Impella removed 4/20, 4/20 MRI with scattered micro embolic infarctions. ETT 4/14-4/24 and reintubated 2 hrs later. Extubated 4/26. EEG (4/23) revealed moderate diffuse encephalopathy, nonspecific etiology. Chest xray  (4/24) revealed:"Bibasilar opacities are mildly worsened in the interval worrisome for pneumonia". Failed [facility] Swallow Screen 2/26. Patient with subsequent reintubation on 5/2 with interval extubation by at least 5/6.  Most recent chest xray dated 05/06 was showing no acdtive cardiopulmonary disease.   Assessment / Plan / Recommendation Clinical Impression  Repeat clinical swallowing evaluation completed given thin liquids via spoon and small straw sips and pureed material.  Patient is known to [facility] service from previous swallowing evaluation during this admission with recommendation of NPO following a 12 day intubation.  Patient has since been intubated two more times and ST had signed off.  ST reconsult as per MD patient is now following some directions. Limited cranial nerve exam was completed due to his issues following all presented directions.  Range of motion of the lips, tongue, face and jaw appeared adequate.  Obvious issues with strength were not seen.  Sensation was unable to be assessed.  Response time to directions was slow.  He presented wtih concern for possible aspiration with intake which is likely impacted by his lengthy and multiple intubations.  He was observed to orally manipulate all presented material with a pharyngeal swallow trigger noted.  Immediate and delayed throat cleating was seen given thin liquids via spoon and small straw sips.  Overt s/s of aspiration were not seen given pureed material.  Given multiple, lengthy intubations suggest patient utilize his DHT tube for oral intake pending results of MBS.  Suggest allowing  the patient to have ice chips for oral dryness pending results of MBS. SLP Visit Diagnosis: Dysphagia, unspecified (R13.10)    Aspiration Risk  Severe aspiration risk    Diet Recommendation   NPO pending MBS results  Medication Administration: Via alternative means    Other  Recommendations Oral Care Recommendations: Oral care QID;Oral care prior to ice  chip/H20   Follow up Recommendations Inpatient Rehab      Frequency and Duration min 2x/week  2 weeks       Prognosis Prognosis for Safe Diet Advancement: Fair Barriers to Reach Goals: Cognitive deficits;Severity of deficits      Swallow Study   General Date of Onset: 01/03/21 HPI: Pt is 70 y.o. with PMH of  HTN, HLD, CAD, admitted with chest pain and STEMI with bradycardia and hypotension. Pt taken for urgent cath with temporary pacer placed but pt decompensated to cardiac arrest with intubation and CPR. Stent placed and pt with further complication of cardiogenic shock with Impella placed. 4/18 seizure like activity, Impella removed 4/20, 4/20 MRI with scattered micro embolic infarctions. ETT 4/14-4/24 and reintubated 2 hrs later. Extubated 4/26. EEG (4/23) revealed moderate diffuse encephalopathy, nonspecific etiology. Chest xray (4/24) revealed:"Bibasilar opacities are mildly worsened in the interval worrisome for pneumonia". Failed [facility] Swallow Screen 2/26. Patient with subsequent reintubation on 5/2 with interval extubation by at least 5/6.  Most recent chest xray dated 05/06 was showing no acdtive cardiopulmonary disease. Type of Study: Bedside Swallow Evaluation Previous Swallow Assessment: During this admission, prior to second intubation. Diet Prior to this Study: NG Tube Temperature Spikes Noted: No Respiratory Status: Room air History of Recent Intubation: Yes Length of Intubations (days): 19 days (over course of 3 intubations) Date extubated:  (some time around 5/6) Behavior/Cognition: Alert;Cooperative;Pleasant mood;Requires cueing Oral Cavity Assessment: Within Functional Limits Oral Care Completed by SLP: No Oral Cavity - Dentition: Adequate natural dentition Vision: Functional for self-feeding Self-Feeding Abilities: Total assist Patient Positioning: Upright in bed Baseline Vocal Quality: Normal Volitional Swallow: Able to elicit    Oral/Motor/Sensory Function  Overall Oral Motor/Sensory Function: Other (comment) (unable to fully assess)   Ice Chips Ice chips: Not tested   Thin Liquid Thin Liquid: Impaired Presentation: Spoon;Straw Pharyngeal  Phase Impairments: Throat Clearing - Immediate;Throat Clearing - Delayed    Nectar Thick Nectar Thick Liquid: Not tested   Honey Thick Honey Thick Liquid: Not tested   Puree Puree: Within functional limits Presentation: Spoon   Solid     Solid: Not tested      Shelly Flatten, MA, [facility] SLP [phone]  Lamar Sprinkles 01/27/2021,9:40 AM

## 2021-01-27 NOTE — Plan of Care (Signed)
  Problem: Elimination: Goal: Will not experience complications related to urinary retention Outcome: Completed/Met   Problem: Pain Managment: Goal: General experience of comfort will improve Outcome: Completed/Met

## 2021-01-27 NOTE — Progress Notes (Signed)
[facility] NEPHROLOGY PROGRESS NOTE  Assessment/ Plan: Pt is a 70 y.o. yo male with history of HTN, HLD, CAD presented with chest pain found to have STEMI.  He underwent left heart cath, placement of Impella device, had V. fib arrest, mechanical ventilation for respiratory failure and AKI.  #Acute kidney injury due to cardiogenic/hemorrhagic shock.  UA bland and kidney ultrasound unremarkable.  Required CRRT from 4/22-4/24.  Patient is nonoliguric and volume status acceptable.  Received intermittent hemodialysis on 4/30 for clearance.  After conversations with palliative medicine decision is made for no further dialysis treatments which is very reasonable .  Now he is recovering GFR.  He does not need dialysis but I think that as his overall global and neurological status improve he would be a candidate again.  Discussed this with HCPOA   #Hypernatremia due to increase free water excretion and recovering AKI.  Stable to improved today  #CAD/acute STEMI required emergent cath with stent placement.  EF 20 to 25%.  AHF following.  #V. fib arrest/cardiogenic shock, acute systolic CHF: Arrest in the setting of acute MI.  Impella was removed.  Now off of pressors or inotropes.  Volume status is acceptable.  Diuretics per cardiology  #Ventilator dependent respiratory failure: Extubated 01/23/21  #Anemia due to acute blood loss: Required multiple blood transfusion.  Transfuse as needed.  #Hypokalemia: Potassium level improved.  #Encephalopathy: Monitor, much improved 5/8  Subjective:   Met with [facility], updated on status  Patient is much more awake and alert this morning, interactive, asking questions, remarkable improvement  Vital signs are stable, on room air  3 L of urine output yesterday,  BUN and creatinine markedly improved today, K3.7  Objective Vital signs in last 24 hours: Vitals:   01/26/21 2001 01/26/21 2330 01/27/21 0400 01/27/21 0756  BP: 132/68 126/67  132/73 131/75  Pulse: 75 78 79 75  Resp: 20 18 20 19   Temp: 98.2 F (36.8 C) 97.7 F (36.5 C) 97.8 F (36.6 C) 97.6 F (36.4 C)  TempSrc: Oral Oral Oral Oral  SpO2: 97% 95% 99%   Weight:   81.2 kg   Height:       Weight change: 1.9 kg  Intake/Output Summary (Last 24 hours) at 01/27/2021 1112 Last data filed at 01/27/2021 0715 Gross per 24 hour  Intake 1927 ml  Output 2528 ml  Net -601 ml       Labs: Basic Metabolic Panel: Recent Labs  Lab 01/25/21 0524 01/26/21 0302 01/27/21 0924  NA 150* 150* 145  K 3.6 3.6 3.7  CL 112* 112* 113*  CO2 26 24 23   GLUCOSE 179* 153* 142*  BUN 122* 120* 100*  CREATININE 3.13* 3.00* 2.41*  CALCIUM 8.6* 8.5* 8.1*   Liver Function Tests: Recent Labs  Lab 01/25/21 0524 01/26/21 0302 01/27/21 0924  AST 40 80* 122*  ALT 101* 127* 203*  ALKPHOS 127* 135* 119  BILITOT 0.8 0.5 0.6  PROT 6.7 6.4* 6.3*  ALBUMIN 2.4* 2.4* 2.4*   No results for input(s): LIPASE, AMYLASE in the last 168 hours. No results for input(s): AMMONIA in the last 168 hours. CBC: Recent Labs  Lab 01/21/21 0346 01/24/21 0359 01/25/21 0524  WBC 22.0* 22.1* 21.6*  NEUTROABS 19.9*  --   --   HGB 8.0* 8.4* 8.3*  HCT 24.8* 26.1* 25.9*  MCV 96.5 97.0 98.9  PLT 391 387 319   Cardiac Enzymes: No results for input(s): CKTOTAL, CKMB, CKMBINDEX, TROPONINI in the last 168 hours.  CBG: Recent Labs  Lab 01/26/21 1642 01/26/21 1951 01/26/21 2324 01/27/21 0415 01/27/21 0759  GLUCAP 135* 148* 114* 126* 96    Iron Studies:  No results for input(s): IRON, TIBC, TRANSFERRIN, FERRITIN in the last 72 hours. Studies/Results: No results found.  Medications: Infusions: . cefTRIAXone (ROCEPHIN)  IV Stopped (01/27/21 0021)  . feeding supplement (OSMOLITE 1.5 CAL) 60 mL/hr at 01/26/21 1800    Scheduled Medications: . aspirin  81 mg Per Tube Daily  . chlorhexidine gluconate (MEDLINE KIT)  15 mL Mouth Rinse BID  . Chlorhexidine Gluconate Cloth  6 each Topical Daily   . docusate  100 mg Oral BID  . enoxaparin (LOVENOX) injection  30 mg Subcutaneous Q24H  . feeding supplement (PROSource TF)  90 mL Per Tube BID  . fiber  1 packet Oral BID  . free water  300 mL Per Tube Q2H  . hydrALAZINE  12.5 mg Per Tube Q8H  . insulin aspart  0-9 Units Subcutaneous Q4H  . isosorbide dinitrate  5 mg Per Tube TID  . mouth rinse  15 mL Mouth Rinse q12n4p  . modafinil  100 mg Per Tube Daily  . pantoprazole sodium  40 mg Per Tube Daily  . polyethylene glycol  17 g Per Tube BID  . rosuvastatin  10 mg Per Tube Daily  . ticagrelor  90 mg Per Tube BID    have reviewed scheduled and prn medications.  Physical Exam: General: More awake, alert, interactive Heart:RRR, s1s2 nl, no rubs Lungs: Coarse breath sound bilateral Abdomen:soft,  non-distended Extremities:No edema  Rexene Agent 01/27/2021,11:12 AM  LOS: 24 days

## 2021-01-28 DIAGNOSIS — G9340 Encephalopathy, unspecified: Secondary | ICD-10-CM | POA: Diagnosis not present

## 2021-01-28 DIAGNOSIS — N390 Urinary tract infection, site not specified: Secondary | ICD-10-CM

## 2021-01-28 DIAGNOSIS — Z515 Encounter for palliative care: Secondary | ICD-10-CM | POA: Diagnosis not present

## 2021-01-28 DIAGNOSIS — R570 Cardiogenic shock: Secondary | ICD-10-CM | POA: Diagnosis not present

## 2021-01-28 DIAGNOSIS — N170 Acute kidney failure with tubular necrosis: Secondary | ICD-10-CM | POA: Diagnosis not present

## 2021-01-28 DIAGNOSIS — J9601 Acute respiratory failure with hypoxia: Secondary | ICD-10-CM | POA: Diagnosis not present

## 2021-01-28 DIAGNOSIS — Z7189 Other specified counseling: Secondary | ICD-10-CM | POA: Diagnosis not present

## 2021-01-28 LAB — GLUCOSE, CAPILLARY
Glucose-Capillary: 103 mg/dL — ABNORMAL HIGH (ref 70–99)
Glucose-Capillary: 105 mg/dL — ABNORMAL HIGH (ref 70–99)
Glucose-Capillary: 110 mg/dL — ABNORMAL HIGH (ref 70–99)
Glucose-Capillary: 118 mg/dL — ABNORMAL HIGH (ref 70–99)
Glucose-Capillary: 119 mg/dL — ABNORMAL HIGH (ref 70–99)
Glucose-Capillary: 98 mg/dL (ref 70–99)

## 2021-01-28 LAB — MAGNESIUM: Magnesium: 2 mg/dL (ref 1.7–2.4)

## 2021-01-28 MED ORDER — ENOXAPARIN SODIUM 40 MG/0.4ML IJ SOSY
40.0000 mg | PREFILLED_SYRINGE | INTRAMUSCULAR | Status: DC
  Administered 2021-01-29 – 2021-02-07 (×10): 40 mg via SUBCUTANEOUS
  Filled 2021-01-28 (×10): qty 0.4

## 2021-01-28 MED ORDER — OSMOLITE 1.5 CAL PO LIQD
720.0000 mL | ORAL | Status: DC
  Administered 2021-01-28: 720 mL
  Filled 2021-01-28 (×4): qty 1000

## 2021-01-28 MED ORDER — ENSURE ENLIVE PO LIQD
237.0000 mL | Freq: Two times a day (BID) | ORAL | Status: DC
  Administered 2021-01-29 – 2021-01-31 (×2): 237 mL via ORAL

## 2021-01-28 NOTE — Progress Notes (Signed)
Per MD, continue 300 mL free water flushes at night with tube feeding.

## 2021-01-28 NOTE — Consult Note (Signed)
   [facility] CM Inpatient Consult   01/28/2021  Gal Feldhaus May 26, 1951 [id number]   [facility] [ACO] Patient: [name] PPO  Primary Care Provider: [name], MD, [facility] [place]  Patient screened for hospitalization with noted extreme high risk score for unplanned readmission risk with a prolonged length of stay,  to assess for potential [facility] service needs for post hospital transition.  Review of patient's medical record reveals patient's disposition and post hospital care is with ongoing determination.  Electronic medical record was briefly reviewed for transition of care with MD notes, inpatient [facility] team notes.  Plan:  Continue to follow progress and disposition to assess for post hospital care management needs for appropriate disposition.  For questions contact:   Natividad Brood, RN BSN [facility] Liaison  [phone] business mobile phone Toll free office [phone]  Fax number: [phone] [email or web address]

## 2021-01-28 NOTE — Progress Notes (Addendum)
Palliative:  HPI: 70 yo male with PMH for HTN, CAD, arthritis, cataracts, GERD, leg neuropathy admitted 01/03/21 with cardiogenic shock. Hospitalization complicated by VF arrest requiring CPR on multiple occasions. Slow progress with concern for anoxic brain injury but he is now becoming more awake and responsive and interactive. He also required HD briefly and continues to work with SLP on diet.   I met today with Mr Balles. He is alert and awake and able to have conversation with me. Per my conversation with RN and in notes he is having improvement in mental status and tolerating dysphagia diet well with good intake. If he is able to meet adequate hydration and nutritional requirements there is no need to discuss PEG tube at this time as it is good to give him more time to see how he will tolerate diet and hopefully continue to improve. Regardless he is expected to have long recovery but he is trending in a good direction at this time. I will continue to follow and support. Mr. Glazier is hopeful for improvement.   All questions/concerns addressed. Emotional support provided.   Exam: Alert, mostly oriented although difficult to discern his recall of all he has been through. No distress. Breathing regular, unlabored. Cortrak in place. Abd soft.   Plan: - Continue efforts with SLP. So far he is tolerating diet and eating/drinking well per RN.  - Patient is hopeful for ongoing improvements.   [place], NP Palliative Medicine Team Pager [phone] (Please see [email or web address] for schedule) Team Phone [phone]    Greater than 50%  of this time was spent counseling and coordinating care related to the above assessment and plan

## 2021-01-28 NOTE — Progress Notes (Signed)
  Speech Language Pathology Treatment: Dysphagia  Patient Details Name: [name] MRN: [id number] DOB: 06-04-1951 Today's Date: 01/28/2021 Time: 0867-6195 SLP Time Calculation (min) (ACUTE ONLY): 16 min  Assessment / Plan / Recommendation Clinical Impression  Pt is more communicative compared to this SLP's last visit. He is oriented to self but only partially oriented to location; disoriented to time. He consumed bites of puree and sips of thin liquids, engaging in self-feeding and not demonstrating any overt s/s of aspiration. Multiple swallows with purees could be indicative of pharyngeal residuals noted on MBS on previous date. Would continue current diet and precautions. Also discussed with MD that orders had previous been d/c for cognitive-linguistic therapy. MD is in agreement with reordering so that we can reassess in light of progress made so far. Will f/u for ongoing dysphagia therapy as well as reassessment of cognition and communication.    HPI HPI: Pt is 70 y.o. with PMH of  HTN, HLD, CAD, admitted with chest pain and STEMI with bradycardia and hypotension. Pt taken for urgent cath with temporary pacer placed but pt decompensated to cardiac arrest with intubation and CPR. Stent placed and pt with further complication of cardiogenic shock with Impella placed. 4/18 seizure like activity, Impella removed 4/20, 4/20 MRI with scattered micro embolic infarctions. ETT 4/14-4/24 and reintubated 2 hrs later. Extubated 4/26. EEG (4/23) revealed moderate diffuse encephalopathy, nonspecific etiology. Chest xray (4/24) revealed:"Bibasilar opacities are mildly worsened in the interval worrisome for pneumonia". Failed Yale Swallow Screen 2/26. Patient with subsequent reintubation on 5/2 with interval extubation by at least 5/6.  Most recent chest xray dated 05/06 was showing no acdtive cardiopulmonary disease.      SLP Plan  Continue with current plan of care       Recommendations  Diet  recommendations: Dysphagia 1 (puree);Thin liquid Liquids provided via: Cup;No straw Medication Administration: Crushed with puree Supervision: Staff to assist with self feeding;Full supervision/cueing for compensatory strategies Compensations: Slow rate;Small sips/bites;Follow solids with liquid Postural Changes and/or Swallow Maneuvers: Seated upright 90 degrees                Oral Care Recommendations: Oral care BID Follow up Recommendations: Inpatient Rehab SLP Visit Diagnosis: Dysphagia, oropharyngeal phase (R13.12) Plan: Continue with current plan of care       GO                Osie Bond., M.A. [facility] Pager [phone] Office [phone]  01/28/2021, 10:16 AM

## 2021-01-28 NOTE — Progress Notes (Signed)
[name] NEPHROLOGY PROGRESS NOTE  Assessment/ Plan: Pt is a 70 y.o. yo male with history of HTN, HLD, CAD presented with chest pain found to have STEMI.  He underwent left heart cath, placement of Impella device, had V. fib arrest, mechanical ventilation for respiratory failure and AKI.  #Acute kidney injury due to cardiogenic/hemorrhagic shock.  UA bland and kidney ultrasound unremarkable.  Required CRRT from 4/22-4/24.  Patient is nonoliguric and volume status acceptable.  Received intermittent hemodialysis on 4/30 for clearance.  After conversations with palliative medicine decision is made for no further dialysis treatments which is very reasonable .  Now he is recovering GFR.  He does not need dialysis but I think that as his overall global and neurological status improve he would be a candidate again.   Will check labs again tomorrow to document improvement and check na and K   #Hypernatremia due to increase free water excretion and recovering AKI.  Stable to improved at last check-  Is still on free water-  Check labs tomorrow to eval  #CAD/acute STEMI required emergent cath with stent placement.  EF 20 to 25%.  AHF following.  #V. fib arrest/cardiogenic shock, acute systolic CHF: Arrest in the setting of acute MI.  Impella was removed.  Now off of pressors or inotropes.  Volume status is acceptable.  Diuretics per cardiology- none at present  #Ventilator dependent respiratory failure: Extubated 01/23/21  #Anemia due to acute blood loss: Required multiple blood transfusion.  Transfuse as needed. Slowly climbing  #Hypokalemia: Potassium level improved.  #Encephalopathy: Monitor, much improved 5/8  Subjective:    Patient has cont to be much more awake and alert, interactive, asking questions- no labs today  Vital signs are stable, on room air  2.4 L of urine output yesterday,  BUN and creatinine  Improved yest-  No new labs, K3.7  Objective Vital signs in last  24 hours: Vitals:   01/27/21 2319 01/28/21 0258 01/28/21 0400 01/28/21 0731  BP: (!) 142/77  (!) 142/77   Pulse: 80  80   Resp: 18  20   Temp: 98 F (36.7 C)  98.7 F (37.1 C) 99.8 F (37.7 C)  TempSrc: Oral  Oral Oral  SpO2: 100%  98%   Weight:  82.5 kg    Height:       Weight change: 1.3 kg  Intake/Output Summary (Last 24 hours) at 01/28/2021 1008 Last data filed at 01/28/2021 0838 Gross per 24 hour  Intake 2130.87 ml  Output 3200 ml  Net -1069.13 ml       Labs: Basic Metabolic Panel: Recent Labs  Lab 01/25/21 0524 01/26/21 0302 01/27/21 0924  NA 150* 150* 145  K 3.6 3.6 3.7  CL 112* 112* 113*  CO2 _0 GLUCOSE 179* 153* 142*  BUN 122* 120* 100*  CREATININE 3.13* 3.00* 2.41*  CALCIUM 8.6* 8.5* 8.1*   Liver Function Tests: Recent Labs  Lab 01/25/21 0524 01/26/21 0302 01/27/21 0924  AST 40 80* 122*  ALT 101* 127* 203*  ALKPHOS 127* 135* 119  BILITOT 0.8 0.5 0.6  PROT 6.7 6.4* 6.3*  ALBUMIN 2.4* 2.4* 2.4*   No results for input(s): LIPASE, AMYLASE in the last 168 hours. No results for input(s): AMMONIA in the last 168 hours. CBC: Recent Labs  Lab 01/24/21 0359 01/25/21 0524 01/27/21 0924  WBC 22.1* 21.6* 12.4*  HGB 8.4* 8.3* 8.6*  HCT 26.1* 25.9* 26.6*  MCV 97.0 98.9 102.7*  PLT 387 319  285   Cardiac Enzymes: No results for input(s): CKTOTAL, CKMB, CKMBINDEX, TROPONINI in the last 168 hours. CBG: Recent Labs  Lab 01/27/21 1548 01/27/21 2022 01/28/21 0016 01/28/21 0417 01/28/21 0733  GLUCAP 113* 98 98 105* 119*    Iron Studies:  No results for input(s): IRON, TIBC, TRANSFERRIN, FERRITIN in the last 72 hours. Studies/Results: DG Swallowing Func-Speech Pathology  Result Date: 01/27/2021 Objective Swallowing Evaluation: Type of Study: MBS-Modified Barium Swallow Study  Patient Details Name: [name] MRN: [id number] Date of Birth: 05/27/1951 Today's Date: 01/27/2021 Time: SLP Start Time (ACUTE ONLY): 1215 -SLP Stop Time (ACUTE  ONLY): 1240 SLP Time Calculation (min) (ACUTE ONLY): 25 min Past Medical History: Past Medical History: Diagnosis Date . Anxiety  . Arthritis  . Cancer ([place]) 2010  basal cell ca, head . Cataract   bilateral . Depression  . ED (erectile dysfunction)  . GERD (gastroesophageal reflux disease)  . Hyperlipidemia  . Hypertension  . Kidney stone 07-29-11  passed  . Neuromuscular disorder ([facility])   neuropathy legs . Vitreous floater   left eye, sees [facility]. Dawna [facility]  Past Surgical History: Past Surgical History: Procedure Laterality Date . BASAL CELL CARCINOMA EXCISION    removed from scalp . COLONOSCOPY  07/03/2017  per Dr. Havery Moros, sessile serrated polyp, repeat in 5 yrs  . HERNIA REPAIR   . LEFT HEART CATH AND CORONARY ANGIOGRAPHY N/A 01/03/2021  Procedure: LEFT HEART CATH AND CORONARY ANGIOGRAPHY;  Surgeon: Troy Sine, MD;  Location: [facility];  Service: Cardiovascular;  Laterality: N/A; . RIGHT HEART CATH N/A 01/03/2021  Procedure: RIGHT HEART CATH;  Surgeon: Jolaine Artist, MD;  Location: [facility];  Service: Cardiovascular;  Laterality: N/A; HPI: Pt is 70 y.o. with PMH of  HTN, HLD, CAD, admitted with chest pain and STEMI with bradycardia and hypotension. Pt taken for urgent cath with temporary pacer placed but pt decompensated to cardiac arrest with intubation and CPR. Stent placed and pt with further complication of cardiogenic shock with Impella placed. 4/18 seizure like activity, Impella removed 4/20, 4/20 MRI with scattered micro embolic infarctions. ETT 4/14-4/24 and reintubated 2 hrs later. Extubated 4/26. EEG (4/23) revealed moderate diffuse encephalopathy, nonspecific etiology. Chest xray (4/24) revealed:"Bibasilar opacities are mildly worsened in the interval worrisome for pneumonia". Failed [facility] Swallow Screen 2/26. Patient with subsequent reintubation on 5/2 with interval extubation by at least 5/6.  Most recent chest xray dated 05/06 was showing no acdtive  cardiopulmonary disease.  Subjective: The patient was seen sitting upright in the laterial view for MBS. Assessment / Plan / Recommendation CHL IP CLINICAL IMPRESSIONS 01/27/2021 Clinical Impression MBS was completed using thin liquids via spoon and cup, nectar thick liquids via spoon and cup, pureed material and dual textured solids.  He presented with an oral and pharyngeal dysphagia that was likely at least partially impacted by his mentation.  Intermittent issues with airway protection were noted but they were not consistent and the pentration was trace.  Suggest patient begin a dysphagia 1 (puree) diet with thin liquids.  He should take small, single sips and he may require cues to swallow at times.  Oral cavity needs to be checked to ensure he has cleared material.  Oral care needs to be diligent with a toothbrush and toothpaste at least 2-3 times per day.  ST will follow during acute stay to initiate swallowing therapy, possible diet advancement and therapeutic diet tolerance.  He may require ST follow up at  the next level of care.  See below for information regarding swallowing physiology.  ORAL PHASE  -Slow/decreased lingual motion leading to a delay in A/P transport of boluses.  At times he was noted to orally hold material requiring cues to swallow.  This was particularly evident during pureed trials.  -Good lingual control during directed bolus hold with little premature spillage noted.  -Slow but functional mastication of dual textured solids.  PHARYNGEAL PHASE  -Timely swallow trigger.  Material noted in the vallecula prior to the swallow trigger. -Intermittently reduced laryngeal elevation.  -Reduced base of tongue retraction and pharyngeal stripping led to decreased epiglottic inversion and pharyngeal residue in the vallecula.  Mild-moderate residue was seen given thin and nectar thick liquids as well as pureed material.  Severe vallecula residue was seen given dual textured solids.  He was not sensate  to this residue.  Cued dry swallow was not successful to clear this material.  Liquid wash was able to clear it using nectar thick liquids.  -Trace penetration after the swallow given thin liquids via spoon sips.  There may have been some trace penetration during the swallow given thin liquids via cup sip on 1/2 trials, however, it may have been residue left from the previously penetrated thin liquids via spoon sips.  -Trace penetration during the swallow given 2/3 trials of nectar thick liquids via cup sips.  ESOPHAGEAL PHASE  -Sweep revealed it slow to clear.  SLP Visit Diagnosis Dysphagia, oropharyngeal phase (R13.12) Attention and concentration deficit following -- Frontal lobe and executive function deficit following -- Impact on safety and function Mild aspiration risk   CHL IP TREATMENT RECOMMENDATION 01/27/2021 Treatment Recommendations Therapy as outlined in treatment plan below   Prognosis 01/27/2021 Prognosis for Safe Diet Advancement Good Barriers to Reach Goals Cognitive deficits Barriers/Prognosis Comment -- CHL IP DIET RECOMMENDATION 01/27/2021 SLP Diet Recommendations Dysphagia 1 (Puree) solids;Thin liquid Liquid Administration via Cup Medication Administration Crushed with puree Compensations Slow rate;Small sips/bites;Follow solids with liquid Postural Changes Remain semi-upright after after feeds/meals (Comment);Seated upright at 90 degrees   CHL IP OTHER RECOMMENDATIONS 01/27/2021 Recommended Consults -- Oral Care Recommendations Oral care BID;Oral care before and after PO Other Recommendations --   CHL IP FOLLOW UP RECOMMENDATIONS 01/27/2021 Follow up Recommendations Inpatient Rehab   CHL IP FREQUENCY AND DURATION 01/27/2021 Speech Therapy Frequency (ACUTE ONLY) min 2x/week Treatment Duration 2 weeks      CHL IP ORAL PHASE 01/27/2021 Oral Phase Impaired Oral - Pudding Teaspoon -- Oral - Pudding Cup -- Oral - Honey Teaspoon -- Oral - Honey Cup -- Oral - Nectar Teaspoon Holding of bolus Oral - Nectar Cup  Holding of bolus Oral - Nectar Straw -- Oral - Thin Teaspoon Holding of bolus;Premature spillage Oral - Thin Cup Holding of bolus;Premature spillage Oral - Thin Straw -- Oral - Puree Holding of bolus Oral - Mech Soft Delayed oral transit Oral - Regular -- Oral - Multi-Consistency -- Oral - Pill -- Oral Phase - Comment --  CHL IP PHARYNGEAL PHASE 01/27/2021 Pharyngeal Phase Impaired Pharyngeal- Pudding Teaspoon -- Pharyngeal -- Pharyngeal- Pudding Cup -- Pharyngeal -- Pharyngeal- Honey Teaspoon -- Pharyngeal -- Pharyngeal- Honey Cup -- Pharyngeal -- Pharyngeal- Nectar Teaspoon Reduced epiglottic inversion;Reduced laryngeal elevation;Reduced tongue base retraction;Pharyngeal residue - valleculae Pharyngeal -- Pharyngeal- Nectar Cup Reduced laryngeal elevation;Reduced tongue base retraction;Pharyngeal residue - valleculae Pharyngeal -- Pharyngeal- Nectar Straw -- Pharyngeal -- Pharyngeal- Thin Teaspoon Reduced epiglottic inversion;Reduced laryngeal elevation;Penetration/Apiration after swallow;Pharyngeal residue - valleculae Pharyngeal Material enters airway, remains  ABOVE vocal cords and not ejected out Pharyngeal- Thin Cup Reduced epiglottic inversion;Reduced laryngeal elevation;Reduced tongue base retraction;Pharyngeal residue - valleculae;Penetration/Aspiration during swallow Pharyngeal Material enters airway, remains ABOVE vocal cords and not ejected out Pharyngeal- Thin Straw -- Pharyngeal -- Pharyngeal- Puree Pharyngeal residue - valleculae Pharyngeal -- Pharyngeal- Mechanical Soft Reduced epiglottic inversion;Reduced laryngeal elevation;Reduced tongue base retraction;Pharyngeal residue - valleculae Pharyngeal -- Pharyngeal- Regular -- Pharyngeal -- Pharyngeal- Multi-consistency -- Pharyngeal -- Pharyngeal- Pill -- Pharyngeal -- Pharyngeal Comment --  CHL IP CERVICAL ESOPHAGEAL PHASE 01/27/2021 Cervical Esophageal Phase WFL Pudding Teaspoon -- Pudding Cup -- Honey Teaspoon -- Honey Cup -- Nectar Teaspoon -- Nectar  Cup -- Nectar Straw -- Thin Teaspoon -- Thin Cup -- Thin Straw -- Puree -- Mechanical Soft -- Regular -- Multi-consistency -- Pill -- Cervical Esophageal Comment -- Shelly Flatten, MA, CCC-SLP Acute Rehab SLP [phone] Lamar Sprinkles 01/27/2021, 1:28 PM               Medications: Infusions: . cefTAZidime (FORTAZ)  IV 2 g (01/28/21 0936)  . feeding supplement (OSMOLITE 1.5 CAL) 60 mL/hr at 01/27/21 1600    Scheduled Medications: . aspirin  81 mg Per Tube Daily  . chlorhexidine gluconate (MEDLINE KIT)  15 mL Mouth Rinse BID  . Chlorhexidine Gluconate Cloth  6 each Topical Daily  . docusate  100 mg Oral BID  . enoxaparin (LOVENOX) injection  30 mg Subcutaneous Q24H  . feeding supplement (PROSource TF)  90 mL Per Tube BID  . fiber  1 packet Oral BID  . free water  300 mL Per Tube Q2H  . hydrALAZINE  12.5 mg Per Tube Q8H  . insulin aspart  0-9 Units Subcutaneous Q4H  . isosorbide dinitrate  5 mg Per Tube TID  . mouth rinse  15 mL Mouth Rinse q12n4p  . modafinil  100 mg Per Tube Daily  . pantoprazole sodium  40 mg Per Tube Daily  . polyethylene glycol  17 g Per Tube BID  . rosuvastatin  10 mg Per Tube Daily  . ticagrelor  90 mg Per Tube BID    have reviewed scheduled and prn medications.  Physical Exam: General:  awake, alert, interactive Heart:RRR, s1s2 nl, no rubs Lungs: Coarse breath sound bilateral Abdomen:soft,  non-distended Extremities:No edema  [name] A Jamey Demchak 01/28/2021,10:08 AM  LOS: 25 days

## 2021-01-28 NOTE — TOC Progression Note (Signed)
Transition of Care (TOC) - Progression Note  Heart Failure   Patient Details  Name: [name] MRN: [id number] Date of Birth: April 02, 1951  Transition of Care [facility]) CM/SW [place], [name] Phone Number: 01/28/2021, 1:47 PM  Clinical Narrative:    DNR on patients chart and notified attending MD for signature of DNR needed.  TOC will continue to follow for discharge needs.  Expected Discharge Plan: [place] Barriers to Discharge: Continued Medical Work up  Expected Discharge Plan and Services Expected Discharge Plan: [place] In-house Referral: Clinical Social Work Discharge Planning Services: CM Consult Post Acute Care Choice: [place] arrangements for the past 2 months: Single Family Home                                       Social Determinants of Health (SDOH) Interventions    Readmission Risk Interventions No flowsheet data found.  Jordin Dambrosio, MSW, [place] Heart Failure Social Worker

## 2021-01-28 NOTE — Progress Notes (Signed)
PROGRESS NOTE  ICU transfer  [name]  MPN:[id number] DOB: 03/25/1951 DOA: 01/03/2021 PCP: [name], MD   Brief Narrative:   Patient is a 70 year old male with past medical history of hypertension, hyperlipidemia, coronary artery disease was admitted as ST elevation MI and cardiac arrest prior to hospital.  Patient underwent emergent cardiac catheterization and PCI with stent to proximal RCA was placed in.  Ejection fraction was noted to be 25 to 30%.  Patient did have prolonged ICU stay for cardiogenic/hemorrhagic shock requiring vasopressors and transfusion and also required Impella..  He suffered from diminished mental status and MRI showed scattered punctate infarctions concerning for anoxic injury.  He also had acute kidney injury and required CRRT and was subsequently transitioned to intermittent hemodialysis.  Patient was extubated on 01/15/2021 and reintubated on 01/20/2021.  Subsequently, patient was extubated on 01/23/2021 and was transferred out of ICU.  Patient remains altered with significant anoxic brain injury.  He also had fever with worsening leukocytosis.  Palliative care consultation was made and healthcare power of attorney has decided not to escalate care but wanting to discuss with the family regarding further goals of care.  Nephrology and cardiology following the patient while in the hospital.   Significant Events: 4/20 Impella removed. MRI brain with numerous micro embolic infarctions. Off pressors.  4/21 lasix drip stopped.  4/22 started CVVH 4/24 CVVH stopped. Failed extubation. Re-intubated.  4/26 Extubated  5/1 Re-intubated 5/4 Extubated, off pressors    Assessment & Plan:   Active Problems:   Cardiogenic shock (HCC)   Hemoptysis   Acute hypoxemic respiratory failure (HCC)   Acute renal failure (HCC)   Encephalopathy acute  Cardiogenic shock.  Currently resolved.  Was on vasopressors and Impella device at some point.  CAD/ Acute Inferior STEMI/acute  HFrEF.  Left ventricular EF of 20 to 25%, patient underwent emergent cardiac catheterization with PCI and drug-eluting stent to the right coronary artery.  continue DAPT and statins.  Continue with IV Lasix.  Not on beta-blockers or ARB or ARNI due to AKI.  Continue with hydralazine and nitrates.  Status post ventricular fibrillation arrest.   Status post  anoxic brain injury.   Palliative care on board.  .  Patient's current status is DNR.   Fever with leukocytosis.  Hemodialysis catheter was removed including Foley catheter.  UA showed some pyuria.  Urine culture showed Pseudomonas.  We will change Rocephin to ceftazidime. hemodialysis catheter tip culture has been sent and negative in last 24 hours.    Chest x-ray showed minimal bibasilar atelectasis.  We will continue to monitor closely.  T-max of 99.8 F.  Will trend leukocytosis  Nonoliguric AKI.   Improving. Most likely from acute tubular necrosis and cardiogenic shock.  Patient received CRRT and  hemodialysis during hospitalization.  No further hemodialysis planned as per nephrology.  Hemodialysis catheter has been removed.  We will continue to monitor BMP.  Creatinine levels have started to improve.  Good urinary output.  Lab Results  Component Value Date   CREATININE 2.41 (H) 01/27/2021   CREATININE 3.00 (H) 01/26/2021   CREATININE 3.13 (H) 01/25/2021    Anoxic brain injury  -MRI of the brain showed microembolic infarcts and EEG showed triphasic  morphology with diffuse encephalopathy.  More alert awake and communicative at this time.  Might benefit from continued physical therapy and rehab  Hypernatremia. Receiving free water through cortrak tube tube.  Sodium level has improved  Nutritional status.   On cortrak tube tube  feeding.  Currently on dysphagia 1 diet as per speech therapy  Subjective: Today, patient was seen and examined at bedside.  Patient denies any shortness of breath, or fever, pain.  Is more alert awake and  communicative.   Objective: Vitals:   01/28/21 0258 01/28/21 0400 01/28/21 0731 01/28/21 1109  BP:  (!) 142/77    Pulse:  80    Resp:  20    Temp:  98.7 F (37.1 C) 99.8 F (37.7 C) 97.9 F (36.6 C)  TempSrc:  Oral Oral Oral  SpO2:  98%    Weight: 82.5 kg     Height:        Intake/Output Summary (Last 24 hours) at 01/28/2021 1238 Last data filed at 01/28/2021 9935 Gross per 24 hour  Intake 2130.87 ml  Output 2750 ml  Net -619.13 ml   Filed Weights   01/26/21 0117 01/27/21 0400 01/28/21 0258  Weight: 79.3 kg 81.2 kg 82.5 kg    Physical examination:  General:  Average built, not in obvious distress, cortrak tube tube in place HENT:   No scleral pallor or icterus noted. Oral mucosa is moist.  Chest:   Diminished breath sounds bilaterally.  Coarse breath sounds noted. CVS: S1 &S2 heard. No murmur.  Regular rate and rhythm. Abdomen: Soft, nontender, nondistended.  Bowel sounds are heard.   Extremities: No cyanosis, clubbing or edema.  Peripheral pulses are palpable. Psych: Alert, awake and oriented to place, follows commands, communicative CNS:  No cranial nerve deficits.  Weakness of the lower extremities mostly on the right side Skin: Warm and dry.  No rashes noted.   DVT prophylaxis: Lovenox subcu  Code Status: DNR  Family Communication:  I tried to call the health power of attorney but was unable to reach him.  Palliative care team care communicating with the family  Disposition Plan:  Status is: Inpatient  Remains inpatient appropriate because:Inpatient level of care appropriate due to severity of illness   Dispo: The patient is from: Home              Anticipated d/c is to:  To be determined , will reconsider rehab potential              Patient currently is not medically stable to d/c.   Difficult to place patient No    Consultants:   PCCM  Cardiology  Nephrology  Procedures:  Intubation and extubation Cardiac catheterization Hemodialysis,  CRRT  Antimicrobials:  Ceftriaxone IV  Data Reviewed: I have personally reviewed the following labs and imaging studies   CBC: Recent Labs  Lab 01/24/21 0359 01/25/21 0524 01/27/21 0924  WBC 22.1* 21.6* 12.4*  HGB 8.4* 8.3* 8.6*  HCT 26.1* 25.9* 26.6*  MCV 97.0 98.9 102.7*  PLT 387 319 701   Basic Metabolic Panel: Recent Labs  Lab 01/23/21 0405 01/24/21 0359 01/25/21 0524 01/26/21 0302 01/27/21 0202 01/27/21 0924 01/28/21 0700  NA 144 146* 150* 150*  --  145  --   K 3.5 3.8 3.6 3.6  --  3.7  --   CL 110 111 112* 112*  --  113*  --   CO2 _0 --  23  --   GLUCOSE 158* 177* 179* 153*  --  142*  --   BUN 102* 111* 122* 120*  --  100*  --   CREATININE 3.00* 2.90* 3.13* 3.00*  --  2.41*  --   CALCIUM 8.4* 8.6* 8.6* 8.5*  --  8.1*  --   MG 2.4 2.3 2.4 2.5* 2.3  --  2.0   GFR: Estimated Creatinine Clearance: 32.2 mL/min (A) (by C-G formula based on SCr of 2.41 mg/dL (H)). Liver Function Tests: Recent Labs  Lab 01/23/21 0405 01/24/21 0359 01/25/21 0524 01/26/21 0302 01/27/21 0924  AST 65* 69* 40 80* 122*  ALT 104* 130* 101* 127* 203*  ALKPHOS 121 140* 127* 135* 119  BILITOT 0.9 0.9 0.8 0.5 0.6  PROT 6.5 6.5 6.7 6.4* 6.3*  ALBUMIN 2.5* 2.5* 2.4* 2.4* 2.4*   No results for input(s): LIPASE, AMYLASE in the last 168 hours. No results for input(s): AMMONIA in the last 168 hours. Coagulation Profile: No results for input(s): INR, PROTIME in the last 168 hours. Cardiac Enzymes: No results for input(s): CKTOTAL, CKMB, CKMBINDEX, TROPONINI in the last 168 hours. BNP (last 3 results) No results for input(s): PROBNP in the last 8760 hours. HbA1C: No results for input(s): HGBA1C in the last 72 hours. CBG: Recent Labs  Lab 01/27/21 2022 01/28/21 0016 01/28/21 0417 01/28/21 0733 01/28/21 1110  GLUCAP 98 98 105* 119* 118*   Lipid Profile: No results for input(s): CHOL, HDL, LDLCALC, TRIG, CHOLHDL, LDLDIRECT in the last 72 hours. Thyroid Function  Tests: No results for input(s): TSH, T4TOTAL, FREET4, T3FREE, THYROIDAB in the last 72 hours. Anemia Panel: No results for input(s): VITAMINB12, FOLATE, FERRITIN, TIBC, IRON, RETICCTPCT in the last 72 hours. Sepsis Labs: No results for input(s): PROCALCITON, LATICACIDVEN in the last 168 hours.  Recent Results (from the past 240 hour(s))  Culture, Urine     Status: Abnormal   Collection Time: 01/25/21  8:48 AM   Specimen: Urine, Catheterized  Result Value Ref Range Status   Specimen Description URINE, CATHETERIZED  Final   Special Requests   Final    NONE Performed at [facility], [address] [place], [place] [address]    Culture 80,000 COLONIES/mL PSEUDOMONAS AERUGINOSA (A)  Final   Report Status 01/27/2021 FINAL  Final   Organism ID, Bacteria PSEUDOMONAS AERUGINOSA (A)  Final      Susceptibility   Pseudomonas aeruginosa - MIC*    CEFTAZIDIME 4 SENSITIVE Sensitive     CIPROFLOXACIN <=0.25 SENSITIVE Sensitive     GENTAMICIN <=1 SENSITIVE Sensitive     IMIPENEM 2 SENSITIVE Sensitive     PIP/TAZO 16 SENSITIVE Sensitive     CEFEPIME 2 SENSITIVE Sensitive     * 80,000 COLONIES/mL PSEUDOMONAS AERUGINOSA  Culture, blood (routine x 2)     Status: None (Preliminary result)   Collection Time: 01/25/21 10:53 AM   Specimen: BLOOD LEFT FOREARM  Result Value Ref Range Status   Specimen Description BLOOD LEFT FOREARM  Final   Special Requests   Final    BOTTLES DRAWN AEROBIC ONLY Blood Culture results may not be optimal due to an excessive volume of blood received in culture bottles   Culture   Final    NO GROWTH 3 DAYS Performed at [facility], [place] [address], [place], [place] [address]    Report Status PENDING  Incomplete  Culture, blood (routine x 2)     Status: None (Preliminary result)   Collection Time: 01/25/21 10:53 AM   Specimen: BLOOD RIGHT FOREARM  Result Value Ref Range Status   Specimen Description BLOOD RIGHT FOREARM  Final   Special Requests   Final     BOTTLES DRAWN AEROBIC ONLY Blood Culture adequate volume   Culture   Final  NO GROWTH 3 DAYS Performed at [facility], [place] [address], [place], [place] [address]    Report Status PENDING  Incomplete  Cath Tip Culture     Status: Abnormal (Preliminary result)   Collection Time: 01/25/21  8:00 PM   Specimen: Catheter Tip; Other  Result Value Ref Range Status   Specimen Description CATH TIP  Final   Special Requests NONE  Final   Culture (A)  Final    3,000 COLONIES/mL STAPHYLOCOCCUS EPIDERMIDIS CULTURE REINCUBATED FOR BETTER GROWTH Performed at [facility], [place] [address] [place], [place] [address]    Report Status PENDING  Incomplete     Radiology Studies: DG Swallowing Func-Speech Pathology  Result Date: 01/27/2021 Objective Swallowing Evaluation: Type of Study: MBS-Modified Barium Swallow Study  Patient Details Name: Phuoc Huy MRN: [id number] Date of Birth: 28-Mar-1951 Today's Date: 01/27/2021 Time: SLP Start Time (ACUTE ONLY): 1215 -SLP Stop Time (ACUTE ONLY): 1240 SLP Time Calculation (min) (ACUTE ONLY): 25 min Past Medical History: Past Medical History: Diagnosis Date . Anxiety  . Arthritis  . Cancer ([place]) 2010  basal cell ca, head . Cataract   bilateral . Depression  . ED (erectile dysfunction)  . GERD (gastroesophageal reflux disease)  . Hyperlipidemia  . Hypertension  . Kidney stone 07-29-11  passed  . Neuromuscular disorder (HCC)   neuropathy legs . Vitreous floater   left eye, sees [facility]. Dawna [facility]  Past Surgical History: Past Surgical History: Procedure Laterality Date . BASAL CELL CARCINOMA EXCISION    removed from scalp . COLONOSCOPY  07/03/2017  per Dr. Havery Moros, sessile serrated polyp, repeat in 5 yrs  . HERNIA REPAIR   . LEFT HEART CATH AND CORONARY ANGIOGRAPHY N/A 01/03/2021  Procedure: LEFT HEART CATH AND CORONARY ANGIOGRAPHY;  Surgeon: Troy Sine, MD;  Location: [facility];  Service: Cardiovascular;  Laterality: N/A; . RIGHT  HEART CATH N/A 01/03/2021  Procedure: RIGHT HEART CATH;  Surgeon: Jolaine Artist, MD;  Location: [facility];  Service: Cardiovascular;  Laterality: N/A; HPI: Pt is 69 y.o. with PMH of  HTN, HLD, CAD, admitted with chest pain and STEMI with bradycardia and hypotension. Pt taken for urgent cath with temporary pacer placed but pt decompensated to cardiac arrest with intubation and CPR. Stent placed and pt with further complication of cardiogenic shock with Impella placed. 4/18 seizure like activity, Impella removed 4/20, 4/20 MRI with scattered micro embolic infarctions. ETT 4/14-4/24 and reintubated 2 hrs later. Extubated 4/26. EEG (4/23) revealed moderate diffuse encephalopathy, nonspecific etiology. Chest xray (4/24) revealed:"Bibasilar opacities are mildly worsened in the interval worrisome for pneumonia". Failed [facility] Swallow Screen 2/26. Patient with subsequent reintubation on 5/2 with interval extubation by at least 5/6.  Most recent chest xray dated 05/06 was showing no acdtive cardiopulmonary disease.  Subjective: The patient was seen sitting upright in the laterial view for MBS. Assessment / Plan / Recommendation CHL IP CLINICAL IMPRESSIONS 01/27/2021 Clinical Impression MBS was completed using thin liquids via spoon and cup, nectar thick liquids via spoon and cup, pureed material and dual textured solids.  He presented with an oral and pharyngeal dysphagia that was likely at least partially impacted by his mentation.  Intermittent issues with airway protection were noted but they were not consistent and the pentration was trace.  Suggest patient begin a dysphagia 1 (puree) diet with thin liquids.  He should take small, single sips and he may require cues to swallow at times.  Oral cavity needs to be checked to ensure he has cleared material.  Oral care needs to be diligent with a toothbrush and toothpaste at least 2-3 times per day.  ST will follow during acute stay to initiate swallowing therapy,  possible diet advancement and therapeutic diet tolerance.  He may require ST follow up at the next level of care.  See below for information regarding swallowing physiology.  ORAL PHASE  -Slow/decreased lingual motion leading to a delay in A/P transport of boluses.  At times he was noted to orally hold material requiring cues to swallow.  This was particularly evident during pureed trials.  -Good lingual control during directed bolus hold with little premature spillage noted.  -Slow but functional mastication of dual textured solids.  PHARYNGEAL PHASE  -Timely swallow trigger.  Material noted in the vallecula prior to the swallow trigger. -Intermittently reduced laryngeal elevation.  -Reduced base of tongue retraction and pharyngeal stripping led to decreased epiglottic inversion and pharyngeal residue in the vallecula.  Mild-moderate residue was seen given thin and nectar thick liquids as well as pureed material.  Severe vallecula residue was seen given dual textured solids.  He was not sensate to this residue.  Cued dry swallow was not successful to clear this material.  Liquid wash was able to clear it using nectar thick liquids.  -Trace penetration after the swallow given thin liquids via spoon sips.  There may have been some trace penetration during the swallow given thin liquids via cup sip on 1/2 trials, however, it may have been residue left from the previously penetrated thin liquids via spoon sips.  -Trace penetration during the swallow given 2/3 trials of nectar thick liquids via cup sips.  ESOPHAGEAL PHASE  -Sweep revealed it slow to clear.  SLP Visit Diagnosis Dysphagia, oropharyngeal phase (R13.12) Attention and concentration deficit following -- Frontal lobe and executive function deficit following -- Impact on safety and function Mild aspiration risk   CHL IP TREATMENT RECOMMENDATION 01/27/2021 Treatment Recommendations Therapy as outlined in treatment plan below   Prognosis 01/27/2021 Prognosis for Safe  Diet Advancement Good Barriers to Reach Goals Cognitive deficits Barriers/Prognosis Comment -- CHL IP DIET RECOMMENDATION 01/27/2021 SLP Diet Recommendations Dysphagia 1 (Puree) solids;Thin liquid Liquid Administration via Cup Medication Administration Crushed with puree Compensations Slow rate;Small sips/bites;Follow solids with liquid Postural Changes Remain semi-upright after after feeds/meals (Comment);Seated upright at 90 degrees   CHL IP OTHER RECOMMENDATIONS 01/27/2021 Recommended Consults -- Oral Care Recommendations Oral care BID;Oral care before and after PO Other Recommendations --   CHL IP FOLLOW UP RECOMMENDATIONS 01/27/2021 Follow up Recommendations Inpatient Rehab   CHL IP FREQUENCY AND DURATION 01/27/2021 Speech Therapy Frequency (ACUTE ONLY) min 2x/week Treatment Duration 2 weeks      CHL IP ORAL PHASE 01/27/2021 Oral Phase Impaired Oral - Pudding Teaspoon -- Oral - Pudding Cup -- Oral - Honey Teaspoon -- Oral - Honey Cup -- Oral - Nectar Teaspoon Holding of bolus Oral - Nectar Cup Holding of bolus Oral - Nectar Straw -- Oral - Thin Teaspoon Holding of bolus;Premature spillage Oral - Thin Cup Holding of bolus;Premature spillage Oral - Thin Straw -- Oral - Puree Holding of bolus Oral - Mech Soft Delayed oral transit Oral - Regular -- Oral - Multi-Consistency -- Oral - Pill -- Oral Phase - Comment --  CHL IP PHARYNGEAL PHASE 01/27/2021 Pharyngeal Phase Impaired Pharyngeal- Pudding Teaspoon -- Pharyngeal -- Pharyngeal- Pudding Cup -- Pharyngeal -- Pharyngeal- Honey Teaspoon -- Pharyngeal -- Pharyngeal- Honey Cup --  Pharyngeal -- Pharyngeal- Nectar Teaspoon Reduced epiglottic inversion;Reduced laryngeal elevation;Reduced tongue base retraction;Pharyngeal residue - valleculae Pharyngeal -- Pharyngeal- Nectar Cup Reduced laryngeal elevation;Reduced tongue base retraction;Pharyngeal residue - valleculae Pharyngeal -- Pharyngeal- Nectar Straw -- Pharyngeal -- Pharyngeal- Thin Teaspoon Reduced epiglottic  inversion;Reduced laryngeal elevation;Penetration/Apiration after swallow;Pharyngeal residue - valleculae Pharyngeal Material enters airway, remains ABOVE vocal cords and not ejected out Pharyngeal- Thin Cup Reduced epiglottic inversion;Reduced laryngeal elevation;Reduced tongue base retraction;Pharyngeal residue - valleculae;Penetration/Aspiration during swallow Pharyngeal Material enters airway, remains ABOVE vocal cords and not ejected out Pharyngeal- Thin Straw -- Pharyngeal -- Pharyngeal- Puree Pharyngeal residue - valleculae Pharyngeal -- Pharyngeal- Mechanical Soft Reduced epiglottic inversion;Reduced laryngeal elevation;Reduced tongue base retraction;Pharyngeal residue - valleculae Pharyngeal -- Pharyngeal- Regular -- Pharyngeal -- Pharyngeal- Multi-consistency -- Pharyngeal -- Pharyngeal- Pill -- Pharyngeal -- Pharyngeal Comment --  CHL IP CERVICAL ESOPHAGEAL PHASE 01/27/2021 Cervical Esophageal Phase WFL Pudding Teaspoon -- Pudding Cup -- Honey Teaspoon -- Honey Cup -- Nectar Teaspoon -- Nectar Cup -- Nectar Straw -- Thin Teaspoon -- Thin Cup -- Thin Straw -- Puree -- Mechanical Soft -- Regular -- Multi-consistency -- Pill -- Cervical Esophageal Comment -- Shelly Flatten, MA, CCC-SLP Acute Rehab SLP [phone] Lamar Sprinkles 01/27/2021, 1:28 PM               Scheduled Meds: . aspirin  81 mg Per Tube Daily  . chlorhexidine gluconate (MEDLINE KIT)  15 mL Mouth Rinse BID  . Chlorhexidine Gluconate Cloth  6 each Topical Daily  . docusate  100 mg Oral BID  . [START ON 01/29/2021] enoxaparin (LOVENOX) injection  40 mg Subcutaneous Q24H  . feeding supplement (PROSource TF)  90 mL Per Tube BID  . fiber  1 packet Oral BID  . free water  300 mL Per Tube Q2H  . hydrALAZINE  12.5 mg Per Tube Q8H  . insulin aspart  0-9 Units Subcutaneous Q4H  . isosorbide dinitrate  5 mg Per Tube TID  . mouth rinse  15 mL Mouth Rinse q12n4p  . modafinil  100 mg Per Tube Daily  . pantoprazole sodium  40 mg Per Tube Daily   . polyethylene glycol  17 g Per Tube BID  . rosuvastatin  10 mg Per Tube Daily  . ticagrelor  90 mg Per Tube BID   Continuous Infusions: . cefTAZidime (FORTAZ)  IV 2 g (01/28/21 0936)  . feeding supplement (OSMOLITE 1.5 CAL) 60 mL/hr at 01/27/21 1600     LOS: 25 days    Flora Lipps, MD [facility] 01/28/2021, 12:38 PM

## 2021-01-28 NOTE — Care Management Important Message (Signed)
Important Message  Patient Details  Name: [name] MRN: [id number] Date of Birth: [birth year]   Medicare Important Message Given:  Yes     [name] 01/28/2021, 10:16 AM

## 2021-01-28 NOTE — Progress Notes (Addendum)
Patient ID: [name], male   DOB: [birth year], 70 y.o.   MRN: [id number]     Advanced Heart Failure Rounding Note  PCP-Cardiologist: None   Subjective:    4/20 Impella removed. MRI brain with numerous micro embolic infarctions. Off pressors.  4/21 lasix drip stopped.  4/22 started CVVH 4/24 CVVH stopped. Failed extubation. Re-intubated.  4/26 Extubated  5/1 Re-intubated 5/4 Extubated   BMET pending.   Says he has good days and bad days. Denies pain.    Objective:   Weight Range: 82.5 kg Body mass index is 24 kg/m.   Vital Signs:   Temp:  [97.5 F (36.4 C)-99.8 F (37.7 C)] 97.9 F (36.6 C) (05/09 1109) Pulse Rate:  [76-80] 80 (05/09 0400) Resp:  [16-20] 20 (05/09 0400) BP: (113-142)/(64-77) 142/77 (05/09 0400) SpO2:  [97 %-100 %] 98 % (05/09 0400) Weight:  [82.5 kg] 82.5 kg (05/09 0258) Last BM Date: 01/27/21  Weight change: Filed Weights   01/26/21 0117 01/27/21 0400 01/28/21 0258  Weight: 79.3 kg 81.2 kg 82.5 kg    Intake/Output:   Intake/Output Summary (Last 24 hours) at 01/28/2021 1145 Last data filed at 01/28/2021 4098 Gross per 24 hour  Intake 2130.87 ml  Output 3200 ml  Net -1069.13 ml      Physical Exam  General:   No resp difficulty HEENT: normal. + Cortrak Neck: supple. JVP 5-6 . Carotids 2+ bilat; no bruits. No lymphadenopathy or thryomegaly appreciated. Cor: PMI nondisplaced. Regular rate & rhythm. No rubs, gallops or murmurs. Lungs: clear on room air.  Abdomen: soft, nontender, nondistended. No hepatosplenomegaly. No bruits or masses. Good bowel sounds. Extremities: no cyanosis, clubbing, rash, R and LLE SCDs  Neuro: alert & orientedx3, cranial nerves grossly intact. moves all 4 extremities . R hand weak. . Affect pleasant   Telemetry   SR 80s   Labs    CBC Recent Labs    01/27/21 0924  WBC 12.4*  HGB 8.6*  HCT 26.6*  MCV 102.7*  PLT 119   Basic Metabolic Panel Recent Labs    01/26/21 0302 01/27/21 0202  01/27/21 0924 01/28/21 0700  NA 150*  --  145  --   K 3.6  --  3.7  --   CL 112*  --  113*  --   CO2 24  --  23  --   GLUCOSE 153*  --  142*  --   BUN 120*  --  100*  --   CREATININE 3.00*  --  2.41*  --   CALCIUM 8.5*  --  8.1*  --   MG 2.5* 2.3  --  2.0   Liver Function Tests Recent Labs    01/26/21 0302 01/27/21 0924  AST 80* 122*  ALT 127* 203*  ALKPHOS 135* 119  BILITOT 0.5 0.6  PROT 6.4* 6.3*  ALBUMIN 2.4* 2.4*   No results for input(s): LIPASE, AMYLASE in the last 72 hours. Cardiac Enzymes No results for input(s): CKTOTAL, CKMB, CKMBINDEX, TROPONINI in the last 72 hours.  BNP: BNP (last 3 results) No results for input(s): BNP in the last 8760 hours.  ProBNP (last 3 results) No results for input(s): PROBNP in the last 8760 hours.   D-Dimer No results for input(s): DDIMER in the last 72 hours. Hemoglobin A1C No results for input(s): HGBA1C in the last 72 hours. Fasting Lipid Panel No results for input(s): CHOL, HDL, LDLCALC, TRIG, CHOLHDL, LDLDIRECT in the last 72 hours. Thyroid Function Tests No results for input(s):  TSH, T4TOTAL, T3FREE, THYROIDAB in the last 72 hours.  Invalid input(s): FREET3  Other results:   Imaging    DG Swallowing Func-Speech Pathology  Result Date: 01/27/2021 Objective Swallowing Evaluation: Type of Study: MBS-Modified Barium Swallow Study  Patient Details Name: [name] MRN: [id number] Date of Birth: 1951-05-02 Today's Date: 01/27/2021 Time: SLP Start Time (ACUTE ONLY): 7829 -SLP Stop Time (ACUTE ONLY): 1240 SLP Time Calculation (min) (ACUTE ONLY): 25 min Past Medical History: Past Medical History: Diagnosis Date . Anxiety  . Arthritis  . Cancer ([place]) 2010  basal cell ca, head . Cataract   bilateral . Depression  . ED (erectile dysfunction)  . GERD (gastroesophageal reflux disease)  . Hyperlipidemia  . Hypertension  . Kidney stone 07-29-11  passed  . Neuromuscular disorder ([facility])   neuropathy legs . Vitreous floater   left eye,  sees Dr. Dawna Part at [facility]  Past Surgical History: Past Surgical History: Procedure Laterality Date . BASAL CELL CARCINOMA EXCISION    removed from scalp . COLONOSCOPY  07/03/2017  per Dr. Havery Moros, sessile serrated polyp, repeat in 5 yrs  . HERNIA REPAIR   . LEFT HEART CATH AND CORONARY ANGIOGRAPHY N/A 01/03/2021  Procedure: LEFT HEART CATH AND CORONARY ANGIOGRAPHY;  Surgeon: Troy Sine, MD;  Location: [facility];  Service: Cardiovascular;  Laterality: N/A; . RIGHT HEART CATH N/A 01/03/2021  Procedure: RIGHT HEART CATH;  Surgeon: Jolaine Artist, MD;  Location: [facility];  Service: Cardiovascular;  Laterality: N/A; HPI: Pt is 70 y.o. with PMH of  HTN, HLD, CAD, admitted with chest pain and STEMI with bradycardia and hypotension. Pt taken for urgent cath with temporary pacer placed but pt decompensated to cardiac arrest with intubation and CPR. Stent placed and pt with further complication of cardiogenic shock with Impella placed. 4/18 seizure like activity, Impella removed 4/20, 4/20 MRI with scattered micro embolic infarctions. ETT 4/14-4/24 and reintubated 2 hrs later. Extubated 4/26. EEG (4/23) revealed moderate diffuse encephalopathy, nonspecific etiology. Chest xray (4/24) revealed:"Bibasilar opacities are mildly worsened in the interval worrisome for pneumonia". Failed [facility] Swallow Screen 2/26. Patient with subsequent reintubation on 5/2 with interval extubation by at least 5/6.  Most recent chest xray dated 05/06 was showing no acdtive cardiopulmonary disease.  Subjective: The patient was seen sitting upright in the laterial view for MBS. Assessment / Plan / Recommendation CHL IP CLINICAL IMPRESSIONS 01/27/2021 Clinical Impression MBS was completed using thin liquids via spoon and cup, nectar thick liquids via spoon and cup, pureed material and dual textured solids.  He presented with an oral and pharyngeal dysphagia that was likely at least partially impacted by his  mentation.  Intermittent issues with airway protection were noted but they were not consistent and the pentration was trace.  Suggest patient begin a dysphagia 1 (puree) diet with thin liquids.  He should take small, single sips and he may require cues to swallow at times.  Oral cavity needs to be checked to ensure he has cleared material.  Oral care needs to be diligent with a toothbrush and toothpaste at least 2-3 times per day.  ST will follow during acute stay to initiate swallowing therapy, possible diet advancement and therapeutic diet tolerance.  He may require ST follow up at the next level of care.  See below for information regarding swallowing physiology.  ORAL PHASE  -Slow/decreased lingual motion leading to a delay in A/P transport of boluses.  At times he was noted to orally hold  material requiring cues to swallow.  This was particularly evident during pureed trials.  -Good lingual control during directed bolus hold with little premature spillage noted.  -Slow but functional mastication of dual textured solids.  PHARYNGEAL PHASE  -Timely swallow trigger.  Material noted in the vallecula prior to the swallow trigger. -Intermittently reduced laryngeal elevation.  -Reduced base of tongue retraction and pharyngeal stripping led to decreased epiglottic inversion and pharyngeal residue in the vallecula.  Mild-moderate residue was seen given thin and nectar thick liquids as well as pureed material.  Severe vallecula residue was seen given dual textured solids.  He was not sensate to this residue.  Cued dry swallow was not successful to clear this material.  Liquid wash was able to clear it using nectar thick liquids.  -Trace penetration after the swallow given thin liquids via spoon sips.  There may have been some trace penetration during the swallow given thin liquids via cup sip on 1/2 trials, however, it may have been residue left from the previously penetrated thin liquids via spoon sips.  -Trace  penetration during the swallow given 2/3 trials of nectar thick liquids via cup sips.  ESOPHAGEAL PHASE  -Sweep revealed it slow to clear.  SLP Visit Diagnosis Dysphagia, oropharyngeal phase (R13.12) Attention and concentration deficit following -- Frontal lobe and executive function deficit following -- Impact on safety and function Mild aspiration risk   CHL IP TREATMENT RECOMMENDATION 01/27/2021 Treatment Recommendations Therapy as outlined in treatment plan below   Prognosis 01/27/2021 Prognosis for Safe Diet Advancement Good Barriers to Reach Goals Cognitive deficits Barriers/Prognosis Comment -- CHL IP DIET RECOMMENDATION 01/27/2021 SLP Diet Recommendations Dysphagia 1 (Puree) solids;Thin liquid Liquid Administration via Cup Medication Administration Crushed with puree Compensations Slow rate;Small sips/bites;Follow solids with liquid Postural Changes Remain semi-upright after after feeds/meals (Comment);Seated upright at 90 degrees   CHL IP OTHER RECOMMENDATIONS 01/27/2021 Recommended Consults -- Oral Care Recommendations Oral care BID;Oral care before and after PO Other Recommendations --   CHL IP FOLLOW UP RECOMMENDATIONS 01/27/2021 Follow up Recommendations Inpatient Rehab   CHL IP FREQUENCY AND DURATION 01/27/2021 Speech Therapy Frequency (ACUTE ONLY) min 2x/week Treatment Duration 2 weeks      CHL IP ORAL PHASE 01/27/2021 Oral Phase Impaired Oral - Pudding Teaspoon -- Oral - Pudding Cup -- Oral - Honey Teaspoon -- Oral - Honey Cup -- Oral - Nectar Teaspoon Holding of bolus Oral - Nectar Cup Holding of bolus Oral - Nectar Straw -- Oral - Thin Teaspoon Holding of bolus;Premature spillage Oral - Thin Cup Holding of bolus;Premature spillage Oral - Thin Straw -- Oral - Puree Holding of bolus Oral - Mech Soft Delayed oral transit Oral - Regular -- Oral - Multi-Consistency -- Oral - Pill -- Oral Phase - Comment --  CHL IP PHARYNGEAL PHASE 01/27/2021 Pharyngeal Phase Impaired Pharyngeal- Pudding Teaspoon -- Pharyngeal --  Pharyngeal- Pudding Cup -- Pharyngeal -- Pharyngeal- Honey Teaspoon -- Pharyngeal -- Pharyngeal- Honey Cup -- Pharyngeal -- Pharyngeal- Nectar Teaspoon Reduced epiglottic inversion;Reduced laryngeal elevation;Reduced tongue base retraction;Pharyngeal residue - valleculae Pharyngeal -- Pharyngeal- Nectar Cup Reduced laryngeal elevation;Reduced tongue base retraction;Pharyngeal residue - valleculae Pharyngeal -- Pharyngeal- Nectar Straw -- Pharyngeal -- Pharyngeal- Thin Teaspoon Reduced epiglottic inversion;Reduced laryngeal elevation;Penetration/Apiration after swallow;Pharyngeal residue - valleculae Pharyngeal Material enters airway, remains ABOVE vocal cords and not ejected out Pharyngeal- Thin Cup Reduced epiglottic inversion;Reduced laryngeal elevation;Reduced tongue base retraction;Pharyngeal residue - valleculae;Penetration/Aspiration during swallow Pharyngeal Material enters airway, remains ABOVE vocal cords and not ejected out Pharyngeal- Thin Straw --  Pharyngeal -- Pharyngeal- Puree Pharyngeal residue - valleculae Pharyngeal -- Pharyngeal- Mechanical Soft Reduced epiglottic inversion;Reduced laryngeal elevation;Reduced tongue base retraction;Pharyngeal residue - valleculae Pharyngeal -- Pharyngeal- Regular -- Pharyngeal -- Pharyngeal- Multi-consistency -- Pharyngeal -- Pharyngeal- Pill -- Pharyngeal -- Pharyngeal Comment --  CHL IP CERVICAL ESOPHAGEAL PHASE 01/27/2021 Cervical Esophageal Phase WFL Pudding Teaspoon -- Pudding Cup -- Honey Teaspoon -- Honey Cup -- Nectar Teaspoon -- Nectar Cup -- Nectar Straw -- Thin Teaspoon -- Thin Cup -- Thin Straw -- Puree -- Mechanical Soft -- Regular -- Multi-consistency -- Pill -- Cervical Esophageal Comment -- Shelly Flatten, MA, CCC-SLP Acute Rehab SLP [phone] Lamar Sprinkles 01/27/2021, 1:28 PM                Medications:     Scheduled Medications: . aspirin  81 mg Per Tube Daily  . chlorhexidine gluconate (MEDLINE KIT)  15 mL Mouth Rinse BID  .  Chlorhexidine Gluconate Cloth  6 each Topical Daily  . docusate  100 mg Oral BID  . [START ON 01/29/2021] enoxaparin (LOVENOX) injection  40 mg Subcutaneous Q24H  . feeding supplement (PROSource TF)  90 mL Per Tube BID  . fiber  1 packet Oral BID  . free water  300 mL Per Tube Q2H  . hydrALAZINE  12.5 mg Per Tube Q8H  . insulin aspart  0-9 Units Subcutaneous Q4H  . isosorbide dinitrate  5 mg Per Tube TID  . mouth rinse  15 mL Mouth Rinse q12n4p  . modafinil  100 mg Per Tube Daily  . pantoprazole sodium  40 mg Per Tube Daily  . polyethylene glycol  17 g Per Tube BID  . rosuvastatin  10 mg Per Tube Daily  . ticagrelor  90 mg Per Tube BID    Infusions: . cefTAZidime (FORTAZ)  IV 2 g (01/28/21 0936)  . feeding supplement (OSMOLITE 1.5 CAL) 60 mL/hr at 01/27/21 1600    PRN Medications: acetaminophen (TYLENOL) oral liquid 160 mg/5 mL, atropine, ipratropium-albuterol, ondansetron (ZOFRAN) IV, sodium chloride flush    Assessment/Plan   1. CAD/ Acute Inferior STEMI - emergent cath w/ pRCA occlusion s/p PCI + DES - Continue DAPT/statin - no chest pain.   2. VF Arrest - in setting of acute MI, now s/p revascularization  - No further VF  3. Acute Systolic Heart Failure>>Cardiogenic Shock  - ICM Echo LVEF 20-25%, RV moderately reduced - Impella out 4/20. - CVVH stopped 4/24.  - Volume status ok. - Consider low dose coreg.  - No b-blocker, ARNI, MRA with AKI  - Continue  Hydral and nitrates.  - BP ok.    4. Acute hypoxic respiratory failure - 4/24 failed extubation trial.  - 5/1 reintubated due to inability to handle secretions/hemoptysis - 5/4 extubated   5. Nonoliguric AKI - due to ATN/shock - CVVH begun 4/22 and stopped 4/24. Had one session of iHD on 4/30 for clearance - BMET pending.   - Have decided on no further HD but maybe able to reconsider given clinical improvement.   6. Anoxic brain injury  - EEG w/ generalized periodic discharges with triphasic morphology +  moderate to severe diffuse encephalopathy.  - MRI- numerous micro embolic infarctions . No hemorrhage or mass.  - CIR has turned down due to poor rehab potential. Recommending SNF - He appears to have significant anoxic injury but over the weekend has made significant improvement. A&O Moving all extremities.  -- Palliative following   7. ID:  - temp 99.8 ,  WBC 22 K (stable)  8. Hypernatremia - BMET pending.   9. DNR Palliative Care following.    Length of Stay: 25  Darrick Grinder, NP  11:45 AM   Patient seen and examined with the above-signed Advanced Practice Provider and/or Housestaff. I personally reviewed laboratory data, imaging studies and relevant notes. I independently examined the patient and formulated the important aspects of the plan. I have edited the note to reflect any of my changes or salient points. I have personally discussed the plan with the patient and/or family.  Much improved today. Following commands. Now talking and moving all extremities. Renal function recovering. No BMET today though  General:  More alert. No resp difficulty HEENT: normal Neck: supple. no JVD. Carotids 2+ bilat; no bruits. No lymphadenopathy or thryomegaly appreciated. Cor: PMI nondisplaced. Regular rate & rhythm. No rubs, gallops or murmurs. Lungs: clear Abdomen: soft, nontender, nondistended. No hepatosplenomegaly. No bruits or masses. Good bowel sounds. Extremities: no cyanosis, clubbing, rash, edema Neuro: talking. Moves all 4. Weak  He had made incredible progress over the weekend. Will continue aggressive PT/OT. Continue hydralazine/nitrates. Recheck labs.   Glori Bickers, MD  3:37 PM

## 2021-01-28 NOTE — Progress Notes (Addendum)
Nutrition Follow-up  DOCUMENTATION CODES:   Not applicable  INTERVENTION:   -Continue dysphagia 1 diet with thin liquids -Magic cup BID with meals, each supplement provides 290 kcal and 9 grams of protein -Ensure Enlive po BID, each supplement provides 350 kcal and 20 grams of protein -Initiate 48 hour calorie count  -Transition to nocturnal feedings -Osmolite 1.5 @ 60 ml/hr via cortrak over 12 hour period  90 ml Prosource TF BID.    300 ml free water flush every 2 hours  Tube feeding regimen provides 1120 kcal (53% of needs), 56 grams of protein, and 547 ml of H2O. Total free water: 4147 ml daily  -Continue 1 packet Nutrisource fiber BID via tube  NUTRITION DIAGNOSIS:   Inadequate oral intake related to acute illness as evidenced by NPO status.  Ongoing  GOAL:   Patient will meet greater than or equal to 90% of their needs  Progressing   MONITOR:   TF tolerance  REASON FOR ASSESSMENT:   Ventilator    ASSESSMENT:   70 yo male admitted with STEMI with cardiogenic shock, cardiac arrest and underwent PCI with stent to RCA, Impella placement  4/20 Impella removed. MRI brain with numerous micro embolic infarctions. Off pressors.  4/21 lasix drip stopped.  4/22 started CVVH 4/24 CVVH stopped. Failed extubation. Re-intubated.  4/26 Extubated  5/1 Re-intubated 5/4 Extubated, off pressors 5/9 s/p BSE- advanced to dysphagia 1 diet with thin liquids  Reviewed I/O's: -290 ml x 24 hours and -6 L since 01/14/21  UOP: 2.4 L x 24 hours  Per SLP notes, pt is able to self feed. Meal completion has been variable today; 20-75% po's documented today.   Per MD notes, pt has made improvements since last week. Per palliative care notes, POA ia hopeful improvement.   Pt remains with cortrak tube, infusing TF. Will transition to nocturnal feedings to help stimulate appetite and determine if pt is able to consume adequate nutrition via PO route.   Medications reviewed and  include miralax.   Labs reviewed: CBGS: 98-119 (inpatient orders for glycemic control are 0-9 units inuslin aspart every 4 hours).   Diet Order:   Diet Order            DIET - DYS 1 Room service appropriate? Yes; Fluid consistency: Thin  Diet effective now                 EDUCATION NEEDS:   Not appropriate for education at this time  Skin:  Skin Assessment: Reviewed RN Assessment  Last BM:  01/27/21  Height:   Ht Readings from Last 1 Encounters:  01/13/21 6\' 1"  (1.854 m)    Weight:   Wt Readings from Last 1 Encounters:  01/28/21 82.5 kg   BMI:  Body mass index is 24 kg/m.  Estimated Nutritional Needs:   Kcal:  2100-2300  Protein:  110-125 gm  Fluid:  >2 L/day    Loistine Chance, RD, LDN, [place] Registered Dietitian II Certified Diabetes Care and Education Specialist Please refer to [facility] for RD and/or RD on-call/weekend/after hours pager

## 2021-01-29 DIAGNOSIS — G9340 Encephalopathy, unspecified: Secondary | ICD-10-CM | POA: Diagnosis not present

## 2021-01-29 DIAGNOSIS — R5381 Other malaise: Secondary | ICD-10-CM | POA: Diagnosis not present

## 2021-01-29 DIAGNOSIS — Z7189 Other specified counseling: Secondary | ICD-10-CM | POA: Diagnosis not present

## 2021-01-29 DIAGNOSIS — J9601 Acute respiratory failure with hypoxia: Secondary | ICD-10-CM | POA: Diagnosis not present

## 2021-01-29 DIAGNOSIS — R570 Cardiogenic shock: Secondary | ICD-10-CM | POA: Diagnosis not present

## 2021-01-29 DIAGNOSIS — N170 Acute kidney failure with tubular necrosis: Secondary | ICD-10-CM | POA: Diagnosis not present

## 2021-01-29 DIAGNOSIS — Z515 Encounter for palliative care: Secondary | ICD-10-CM | POA: Diagnosis not present

## 2021-01-29 LAB — MAGNESIUM: Magnesium: 2 mg/dL (ref 1.7–2.4)

## 2021-01-29 LAB — CBC
HCT: 25.1 % — ABNORMAL LOW (ref 39.0–52.0)
Hemoglobin: 8.1 g/dL — ABNORMAL LOW (ref 13.0–17.0)
MCH: 31.4 pg (ref 26.0–34.0)
MCHC: 32.3 g/dL (ref 30.0–36.0)
MCV: 97.3 fL (ref 80.0–100.0)
Platelets: 271 10*3/uL (ref 150–400)
RBC: 2.58 MIL/uL — ABNORMAL LOW (ref 4.22–5.81)
RDW: 16.5 % — ABNORMAL HIGH (ref 11.5–15.5)
WBC: 15.2 10*3/uL — ABNORMAL HIGH (ref 4.0–10.5)
nRBC: 0 % (ref 0.0–0.2)

## 2021-01-29 LAB — GLUCOSE, CAPILLARY
Glucose-Capillary: 108 mg/dL — ABNORMAL HIGH (ref 70–99)
Glucose-Capillary: 111 mg/dL — ABNORMAL HIGH (ref 70–99)
Glucose-Capillary: 128 mg/dL — ABNORMAL HIGH (ref 70–99)
Glucose-Capillary: 132 mg/dL — ABNORMAL HIGH (ref 70–99)
Glucose-Capillary: 92 mg/dL (ref 70–99)
Glucose-Capillary: 98 mg/dL (ref 70–99)

## 2021-01-29 LAB — RENAL FUNCTION PANEL
Albumin: 2.3 g/dL — ABNORMAL LOW (ref 3.5–5.0)
Anion gap: 11 (ref 5–15)
BUN: 76 mg/dL — ABNORMAL HIGH (ref 8–23)
CO2: 21 mmol/L — ABNORMAL LOW (ref 22–32)
Calcium: 8.2 mg/dL — ABNORMAL LOW (ref 8.9–10.3)
Chloride: 108 mmol/L (ref 98–111)
Creatinine, Ser: 2.13 mg/dL — ABNORMAL HIGH (ref 0.61–1.24)
GFR, Estimated: 33 mL/min — ABNORMAL LOW (ref >60)
Glucose, Bld: 126 mg/dL — ABNORMAL HIGH (ref 70–99)
Phosphorus: 4.6 mg/dL (ref 2.5–4.6)
Potassium: 3.6 mmol/L (ref 3.5–5.1)
Sodium: 140 mmol/L (ref 135–145)

## 2021-01-29 MED ORDER — INSULIN ASPART 100 UNIT/ML IJ SOLN
0.0000 [IU] | Freq: Three times a day (TID) | INTRAMUSCULAR | Status: DC
Start: 2021-01-29 — End: 2021-02-07
  Administered 2021-01-30 – 2021-02-01 (×2): 1 [IU] via SUBCUTANEOUS
  Administered 2021-02-02: 2 [IU] via SUBCUTANEOUS
  Administered 2021-02-03 – 2021-02-05 (×3): 1 [IU] via SUBCUTANEOUS

## 2021-01-29 MED ORDER — HYDRALAZINE HCL 25 MG PO TABS
25.0000 mg | ORAL_TABLET | Freq: Three times a day (TID) | ORAL | Status: DC
  Administered 2021-01-29 – 2021-02-02 (×11): 25 mg
  Filled 2021-01-29 (×11): qty 1

## 2021-01-29 MED ORDER — POTASSIUM CHLORIDE CRYS ER 20 MEQ PO TBCR
40.0000 meq | EXTENDED_RELEASE_TABLET | Freq: Once | ORAL | Status: AC
  Administered 2021-01-29: 40 meq via ORAL
  Filled 2021-01-29: qty 2

## 2021-01-29 MED ORDER — ISOSORBIDE DINITRATE 10 MG PO TABS
10.0000 mg | ORAL_TABLET | Freq: Three times a day (TID) | ORAL | Status: DC
  Administered 2021-01-29 – 2021-02-01 (×11): 10 mg
  Filled 2021-01-29 (×12): qty 1

## 2021-01-29 NOTE — Progress Notes (Signed)
PROGRESS NOTE  ICU transfer  [name]  AOZ:[id number] DOB: Jul 23, 1951 DOA: 01/03/2021 PCP: [name], MD   Brief Narrative:   Patient is a 70 year old male with past medical history of hypertension, hyperlipidemia, coronary artery disease was admitted as ST elevation MI and cardiac arrest prior to hospital.  Patient underwent emergent cardiac catheterization and PCI with stent to proximal RCA was placed in.  Ejection fraction was noted to be 25 to 30%.  Patient did have prolonged ICU stay for cardiogenic/hemorrhagic shock requiring vasopressors and transfusion and also required Impella..  He suffered from diminished mental status and MRI showed scattered punctate infarctions concerning for anoxic injury.  He also had acute kidney injury and required CRRT and was subsequently transitioned to intermittent hemodialysis.  Patient was extubated on 01/15/2021 and reintubated on 01/20/2021.  Subsequently, patient was extubated on 01/23/2021 and was transferred out of ICU.  Patient remains altered with significant anoxic Dalton injury.  He also had fever with worsening leukocytosis.  Palliative care consultation was made and healthcare power of attorney has decided not to escalate care but wanting to discuss with the family regarding further goals of care.  Nephrology and cardiology following the patient while in the hospital.   Significant Events: 4/20 Impella removed. MRI Dalton with numerous micro embolic infarctions. Off pressors.  4/21 lasix drip stopped.  4/22 started CVVH 4/24 CVVH stopped. Failed extubation. Re-intubated.  4/26 Extubated  5/1 Re-intubated 5/4 Extubated, off pressors    Assessment & Plan:   Active Problems:   Cardiogenic shock (HCC)   Hemoptysis   Acute hypoxemic respiratory failure (HCC)   Acute renal failure (HCC)   Encephalopathy acute  Cardiogenic shock.  Currently resolved.  Was on vasopressors and Impella device at some point.  CAD/ Acute Inferior STEMI/acute  HFrEF.  Left ventricular EF of 20 to 25%, patient underwent emergent cardiac catheterization with PCI and drug-eluting stent to the right coronary artery.  continue DAPT and statins.   Not on beta-blockers or ARB or ARNI due to AKI.  Continue with hydralazine and nitrates.  Status post ventricular fibrillation arrest.    Status post  anoxic Dalton injury.  Improving cognition.  Palliative care on board.  .  Patient's current status is DNR.   Fever with leukocytosis likely secondary to pseudomonal UTI.  Hemodialysis catheter was removed including Foley catheter.  UA showed some pyuria.  Urine culture showed Pseudomonas.  Patient was initially on Rocephin which has been changed to ceftazidime.. hemodialysis catheter tip culture with 3000 colonies of staph epidermidis.  I don't l think that's quite significant..  Blood cultures have been negative in 4 days.  Chest x-ray showed minimal bibasilar atelectasis.  We will continue to monitor closely.  T-max of 98.6 F.  Will trend leukocytosis.  Nonoliguric AKI.   Improving. Most likely from acute tubular necrosis and cardiogenic shock.  Patient received CRRT and  hemodialysis during hospitalization.  No further hemodialysis planned as per nephrology.  Hemodialysis catheter has been removed.  We will continue to monitor BMP.  Creatinine levels have started to improve.    Lab Results  Component Value Date   CREATININE 2.13 (H) 01/29/2021   CREATININE 2.41 (H) 01/27/2021   CREATININE 3.00 (H) 01/26/2021    Anoxic Dalton injury  -MRI of the Dalton showed microembolic infarcts and EEG showed triphasic  morphology with diffuse encephalopathy.  More alert awake and communicative at this time.  Likely to improve.  Hypernatremia. Receiving free water through cortrak tube tube.  Sodium level has improved.  Nutritional status.   On cortrak tube tube feeding.  Currently on dysphagia 1 diet as per speech therapy.  Will advance diet as  tolerated.  Subjective: Today, patient was seen and examined at bedside.  Denies any headache, shortness of breath, nausea vomiting.  Communicative.   Objective: Vitals:   01/29/21 0000 01/29/21 0400 01/29/21 0500 01/29/21 0814  BP: 131/68 127/64  (!) 141/77  Pulse: 83 86  87  Resp: 16 16  18   Temp: 98.4 F (36.9 C) 98.6 F (37 C)  98 F (36.7 C)  TempSrc: Oral Oral  Oral  SpO2: 96% 97%  100%  Weight: 82.4 kg  82.4 kg   Height:        Intake/Output Summary (Last 24 hours) at 01/29/2021 1116 Last data filed at 01/29/2021 1000 Gross per 24 hour  Intake 3160 ml  Output 3100 ml  Net 60 ml   Filed Weights   01/28/21 0258 01/29/21 0000 01/29/21 0500  Weight: 82.5 kg 82.4 kg 82.4 kg    Physical examination:  General:  Average built, not in obvious distress, cortrak tube tube in place HENT:   No scleral pallor or icterus noted. Oral mucosa is moist.  Chest:    Diminished breath sounds bilaterally. No crackles or wheezes.  CVS: S1 &S2 heard. No murmur.  Regular rate and rhythm. Abdomen: Soft, nontender, nondistended.  Bowel sounds are heard.   Extremities: No cyanosis, clubbing or edema.  Peripheral pulses are palpable. Psych: Alert, awake and oriented to place,  CNS:  No cranial nerve deficits.  Moving extremities, weakness noted Skin: Warm and dry.  No rashes noted.   DVT prophylaxis: Lovenox subcu  Code Status: DNR  Family Communication:  Spoke with the patient's health power of attorney Mr. Merry Proud on the phone and updated him about the clinical condition of the patient.  Disposition Plan:  Status is: Inpatient  Remains inpatient appropriate because:Inpatient level of care appropriate due to severity of illness, need for rehabilitation   Dispo: The patient is from: Home              Anticipated d/c is to:  To be determined , Physical therapy has recommended a skilled nursing facility placement.               Patient currently is not medically stable to d/c.  Will  need to address nutrition, swallowing and rehabilitation needs.   Difficult to place patient No    Consultants:   PCCM  Cardiology  Nephrology  Procedures:  Intubation and extubation Cardiac catheterization Hemodialysis, CRRT  Antimicrobials:  Ceftazidime  Data Reviewed: I have personally reviewed the following labs and imaging studies   CBC: Recent Labs  Lab 01/24/21 0359 01/25/21 0524 01/27/21 0924 01/29/21 0334  WBC 22.1* 21.6* 12.4* 15.2*  HGB 8.4* 8.3* 8.6* 8.1*  HCT 26.1* 25.9* 26.6* 25.1*  MCV 97.0 98.9 102.7* 97.3  PLT 387 319 285 163   Basic Metabolic Panel: Recent Labs  Lab 01/24/21 0359 01/25/21 0524 01/26/21 0302 01/27/21 0202 01/27/21 0924 01/28/21 0700 01/29/21 0334  NA 146* 150* 150*  --  145  --  140  K 3.8 3.6 3.6  --  3.7  --  3.6  CL 111 112* 112*  --  113*  --  108  CO2 25 26 24   --  23  --  21*  GLUCOSE 177* 179* 153*  --  142*  --  126*  BUN 111* 122*  120*  --  100*  --  76*  CREATININE 2.90* 3.13* 3.00*  --  2.41*  --  2.13*  CALCIUM 8.6* 8.6* 8.5*  --  8.1*  --  8.2*  MG 2.3 2.4 2.5* 2.3  --  2.0 2.0  PHOS  --   --   --   --   --   --  4.6   GFR: Estimated Creatinine Clearance: 36.5 mL/min (A) (by C-G formula based on SCr of 2.13 mg/dL (H)). Liver Function Tests: Recent Labs  Lab 01/23/21 0405 01/24/21 0359 01/25/21 0524 01/26/21 0302 01/27/21 0924 01/29/21 0334  AST 65* 69* 40 80* 122*  --   ALT 104* 130* 101* 127* 203*  --   ALKPHOS 121 140* 127* 135* 119  --   BILITOT 0.9 0.9 0.8 0.5 0.6  --   PROT 6.5 6.5 6.7 6.4* 6.3*  --   ALBUMIN 2.5* 2.5* 2.4* 2.4* 2.4* 2.3*   No results for input(s): LIPASE, AMYLASE in the last 168 hours. No results for input(s): AMMONIA in the last 168 hours. Coagulation Profile: No results for input(s): INR, PROTIME in the last 168 hours. Cardiac Enzymes: No results for input(s): CKTOTAL, CKMB, CKMBINDEX, TROPONINI in the last 168 hours. BNP (last 3 results) No results for input(s):  PROBNP in the last 8760 hours. HbA1C: No results for input(s): HGBA1C in the last 72 hours. CBG: Recent Labs  Lab 01/28/21 1620 01/28/21 2002 01/29/21 0005 01/29/21 0401 01/29/21 0810  GLUCAP 110* 103* 108* 111* 132*   Lipid Profile: No results for input(s): CHOL, HDL, LDLCALC, TRIG, CHOLHDL, LDLDIRECT in the last 72 hours. Thyroid Function Tests: No results for input(s): TSH, T4TOTAL, FREET4, T3FREE, THYROIDAB in the last 72 hours. Anemia Panel: No results for input(s): VITAMINB12, FOLATE, FERRITIN, TIBC, IRON, RETICCTPCT in the last 72 hours. Sepsis Labs: No results for input(s): PROCALCITON, LATICACIDVEN in the last 168 hours.  Recent Results (from the past 240 hour(s))  Culture, Urine     Status: Abnormal   Collection Time: 01/25/21  8:48 AM   Specimen: Urine, Catheterized  Result Value Ref Range Status   Specimen Description URINE, CATHETERIZED  Final   Special Requests   Final    NONE Performed at [facility], [address], [place], [place] [address]    Culture 80,000 COLONIES/mL PSEUDOMONAS AERUGINOSA (A)  Final   Report Status 01/27/2021 FINAL  Final   Organism ID, Bacteria PSEUDOMONAS AERUGINOSA (A)  Final      Susceptibility   Pseudomonas aeruginosa - MIC*    CEFTAZIDIME 4 SENSITIVE Sensitive     CIPROFLOXACIN <=0.25 SENSITIVE Sensitive     GENTAMICIN <=1 SENSITIVE Sensitive     IMIPENEM 2 SENSITIVE Sensitive     PIP/TAZO 16 SENSITIVE Sensitive     CEFEPIME 2 SENSITIVE Sensitive     * 80,000 COLONIES/mL PSEUDOMONAS AERUGINOSA  Culture, blood (routine x 2)     Status: None (Preliminary result)   Collection Time: 01/25/21 10:53 AM   Specimen: BLOOD LEFT FOREARM  Result Value Ref Range Status   Specimen Description BLOOD LEFT FOREARM  Final   Special Requests   Final    BOTTLES DRAWN AEROBIC ONLY Blood Culture results may not be optimal due to an excessive volume of blood received in culture bottles   Culture   Final    NO GROWTH 4 DAYS Performed  at [facility], [place] [address] [place], [place] [address]    Report Status PENDING  Incomplete  Culture, blood (routine x 2)     Status: None (Preliminary result)   Collection Time: 01/25/21 10:53 AM   Specimen: BLOOD RIGHT FOREARM  Result Value Ref Range Status   Specimen Description BLOOD RIGHT FOREARM  Final   Special Requests   Final    BOTTLES DRAWN AEROBIC ONLY Blood Culture adequate volume   Culture   Final    NO GROWTH 4 DAYS Performed at [facility], [place] [address], [place], [place] [address]    Report Status PENDING  Incomplete  Cath Tip Culture     Status: Abnormal (Preliminary result)   Collection Time: 01/25/21  8:00 PM   Specimen: Catheter Tip; Other  Result Value Ref Range Status   Specimen Description CATH TIP  Final   Special Requests NONE  Final   Culture (A)  Final    3,000 COLONIES/mL STAPHYLOCOCCUS EPIDERMIDIS SUSCEPTIBILITIES TO FOLLOW Performed at [facility], [place] [address], [place], [place] [address]    Report Status PENDING  Incomplete     Radiology Studies: DG Swallowing Func-Speech Pathology  Result Date: 01/27/2021 Objective Swallowing Evaluation: Type of Study: MBS-Modified Barium Swallow Study  Patient Details Name: Orvile Corona MRN: [id number] Date of Birth: 1951/02/20 Today's Date: 01/27/2021 Time: SLP Start Time (ACUTE ONLY): 1215 -SLP Stop Time (ACUTE ONLY): 1240 SLP Time Calculation (min) (ACUTE ONLY): 25 min Past Medical History: Past Medical History: Diagnosis Date . Anxiety  . Arthritis  . Cancer ([place]) 2010  basal cell ca, head . Cataract   bilateral . Depression  . ED (erectile dysfunction)  . GERD (gastroesophageal reflux disease)  . Hyperlipidemia  . Hypertension  . Kidney stone 07-29-11  passed  . Neuromuscular disorder (HCC)   neuropathy legs . Vitreous floater   left eye, sees Dr. Dawna Part at [facility]  Past Surgical History: Past Surgical History: Procedure Laterality Date . BASAL CELL CARCINOMA EXCISION     removed from scalp . COLONOSCOPY  07/03/2017  per Dr. Havery Moros, sessile serrated polyp, repeat in 5 yrs  . HERNIA REPAIR   . LEFT HEART CATH AND CORONARY ANGIOGRAPHY N/A 01/03/2021  Procedure: LEFT HEART CATH AND CORONARY ANGIOGRAPHY;  Surgeon: Troy Sine, MD;  Location: [facility] [facility];  Service: Cardiovascular;  Laterality: N/A; . RIGHT HEART CATH N/A 01/03/2021  Procedure: RIGHT HEART CATH;  Surgeon: Jolaine Artist, MD;  Location: [facility] [facility];  Service: Cardiovascular;  Laterality: N/A; HPI: Pt is 70 y.o. with PMH of  HTN, HLD, CAD, admitted with chest pain and STEMI with bradycardia and hypotension. Pt taken for urgent cath with temporary pacer placed but pt decompensated to cardiac arrest with intubation and CPR. Stent placed and pt with further complication of cardiogenic shock with Impella placed. 4/18 seizure like activity, Impella removed 4/20, 4/20 MRI with scattered micro embolic infarctions. ETT 4/14-4/24 and reintubated 2 hrs later. Extubated 4/26. EEG (4/23) revealed moderate diffuse encephalopathy, nonspecific etiology. Chest xray (4/24) revealed:"Bibasilar opacities are mildly worsened in the interval worrisome for pneumonia". Failed [facility] Swallow Screen 2/26. Patient with subsequent reintubation on 5/2 with interval extubation by at least 5/6.  Most recent chest xray dated 05/06 was showing no acdtive cardiopulmonary disease.  Subjective: The patient was seen sitting upright in the laterial view for MBS. Assessment / Plan / Recommendation CHL IP CLINICAL IMPRESSIONS 01/27/2021 Clinical Impression MBS was completed using thin liquids via spoon and cup, nectar thick liquids via spoon and cup, pureed material and dual  textured solids.  He presented with an oral and pharyngeal dysphagia that was likely at least partially impacted by his mentation.  Intermittent issues with airway protection were noted but they were not consistent and the pentration was trace.  Suggest patient begin a  dysphagia 1 (puree) diet with thin liquids.  He should take small, single sips and he may require cues to swallow at times.  Oral cavity needs to be checked to ensure he has cleared material.  Oral care needs to be diligent with a toothbrush and toothpaste at least 2-3 times per day.  ST will follow during acute stay to initiate swallowing therapy, possible diet advancement and therapeutic diet tolerance.  He may require ST follow up at the next level of care.  See below for information regarding swallowing physiology.  ORAL PHASE  -Slow/decreased lingual motion leading to a delay in A/P transport of boluses.  At times he was noted to orally hold material requiring cues to swallow.  This was particularly evident during pureed trials.  -Good lingual control during directed bolus hold with little premature spillage noted.  -Slow but functional mastication of dual textured solids.  PHARYNGEAL PHASE  -Timely swallow trigger.  Material noted in the vallecula prior to the swallow trigger. -Intermittently reduced laryngeal elevation.  -Reduced base of tongue retraction and pharyngeal stripping led to decreased epiglottic inversion and pharyngeal residue in the vallecula.  Mild-moderate residue was seen given thin and nectar thick liquids as well as pureed material.  Severe vallecula residue was seen given dual textured solids.  He was not sensate to this residue.  Cued dry swallow was not successful to clear this material.  Liquid wash was able to clear it using nectar thick liquids.  -Trace penetration after the swallow given thin liquids via spoon sips.  There may have been some trace penetration during the swallow given thin liquids via cup sip on 1/2 trials, however, it may have been residue left from the previously penetrated thin liquids via spoon sips.  -Trace penetration during the swallow given 2/3 trials of nectar thick liquids via cup sips.  ESOPHAGEAL PHASE  -Sweep revealed it slow to clear.  SLP Visit  Diagnosis Dysphagia, oropharyngeal phase (R13.12) Attention and concentration deficit following -- Frontal lobe and executive function deficit following -- Impact on safety and function Mild aspiration risk   CHL IP TREATMENT RECOMMENDATION 01/27/2021 Treatment Recommendations Therapy as outlined in treatment plan below   Prognosis 01/27/2021 Prognosis for Safe Diet Advancement Good Barriers to Reach Goals Cognitive deficits Barriers/Prognosis Comment -- CHL IP DIET RECOMMENDATION 01/27/2021 SLP Diet Recommendations Dysphagia 1 (Puree) solids;Thin liquid Liquid Administration via Cup Medication Administration Crushed with puree Compensations Slow rate;Small sips/bites;Follow solids with liquid Postural Changes Remain semi-upright after after feeds/meals (Comment);Seated upright at 90 degrees   CHL IP OTHER RECOMMENDATIONS 01/27/2021 Recommended Consults -- Oral Care Recommendations Oral care BID;Oral care before and after PO Other Recommendations --   CHL IP FOLLOW UP RECOMMENDATIONS 01/27/2021 Follow up Recommendations Inpatient Rehab   CHL IP FREQUENCY AND DURATION 01/27/2021 Speech Therapy Frequency (ACUTE ONLY) min 2x/week Treatment Duration 2 weeks      CHL IP ORAL PHASE 01/27/2021 Oral Phase Impaired Oral - Pudding Teaspoon -- Oral - Pudding Cup -- Oral - Honey Teaspoon -- Oral - Honey Cup -- Oral - Nectar Teaspoon Holding of bolus Oral - Nectar Cup Holding of bolus Oral - Nectar Straw -- Oral - Thin Teaspoon Holding of bolus;Premature spillage Oral - Thin Cup Holding of  bolus;Premature spillage Oral - Thin Straw -- Oral - Puree Holding of bolus Oral - Mech Soft Delayed oral transit Oral - Regular -- Oral - Multi-Consistency -- Oral - Pill -- Oral Phase - Comment --  CHL IP PHARYNGEAL PHASE 01/27/2021 Pharyngeal Phase Impaired Pharyngeal- Pudding Teaspoon -- Pharyngeal -- Pharyngeal- Pudding Cup -- Pharyngeal -- Pharyngeal- Honey Teaspoon -- Pharyngeal -- Pharyngeal- Honey Cup -- Pharyngeal -- Pharyngeal- Nectar Teaspoon  Reduced epiglottic inversion;Reduced laryngeal elevation;Reduced tongue base retraction;Pharyngeal residue - valleculae Pharyngeal -- Pharyngeal- Nectar Cup Reduced laryngeal elevation;Reduced tongue base retraction;Pharyngeal residue - valleculae Pharyngeal -- Pharyngeal- Nectar Straw -- Pharyngeal -- Pharyngeal- Thin Teaspoon Reduced epiglottic inversion;Reduced laryngeal elevation;Penetration/Apiration after swallow;Pharyngeal residue - valleculae Pharyngeal Material enters airway, remains ABOVE vocal cords and not ejected out Pharyngeal- Thin Cup Reduced epiglottic inversion;Reduced laryngeal elevation;Reduced tongue base retraction;Pharyngeal residue - valleculae;Penetration/Aspiration during swallow Pharyngeal Material enters airway, remains ABOVE vocal cords and not ejected out Pharyngeal- Thin Straw -- Pharyngeal -- Pharyngeal- Puree Pharyngeal residue - valleculae Pharyngeal -- Pharyngeal- Mechanical Soft Reduced epiglottic inversion;Reduced laryngeal elevation;Reduced tongue base retraction;Pharyngeal residue - valleculae Pharyngeal -- Pharyngeal- Regular -- Pharyngeal -- Pharyngeal- Multi-consistency -- Pharyngeal -- Pharyngeal- Pill -- Pharyngeal -- Pharyngeal Comment --  CHL IP CERVICAL ESOPHAGEAL PHASE 01/27/2021 Cervical Esophageal Phase WFL Pudding Teaspoon -- Pudding Cup -- Honey Teaspoon -- Honey Cup -- Nectar Teaspoon -- Nectar Cup -- Nectar Straw -- Thin Teaspoon -- Thin Cup -- Thin Straw -- Puree -- Mechanical Soft -- Regular -- Multi-consistency -- Pill -- Cervical Esophageal Comment -- Shelly Flatten, MA, CCC-SLP Acute Rehab SLP [phone] Lamar Sprinkles 01/27/2021, 1:28 PM               Scheduled Meds: . aspirin  81 mg Per Tube Daily  . chlorhexidine gluconate (MEDLINE KIT)  15 mL Mouth Rinse BID  . Chlorhexidine Gluconate Cloth  6 each Topical Daily  . docusate  100 mg Oral BID  . enoxaparin (LOVENOX) injection  40 mg Subcutaneous Q24H  . feeding supplement  237 mL Oral BID BM  .  feeding supplement (PROSource TF)  90 mL Per Tube BID  . fiber  1 packet Oral BID  . hydrALAZINE  12.5 mg Per Tube Q8H  . insulin aspart  0-9 Units Subcutaneous Q4H  . isosorbide dinitrate  5 mg Per Tube TID  . mouth rinse  15 mL Mouth Rinse q12n4p  . modafinil  100 mg Per Tube Daily  . pantoprazole sodium  40 mg Per Tube Daily  . polyethylene glycol  17 g Per Tube BID  . rosuvastatin  10 mg Per Tube Daily  . ticagrelor  90 mg Per Tube BID   Continuous Infusions: . cefTAZidime (FORTAZ)  IV 2 g (01/28/21 2135)  . feeding supplement (OSMOLITE 1.5 CAL) 720 mL (01/28/21 2012)     LOS: 26 days    Flora Lipps, MD [facility] 01/29/2021, 11:16 AM

## 2021-01-29 NOTE — Progress Notes (Addendum)
Physical Therapy Treatment Patient Details Name: [name] MRN: [id number] DOB: 09-11-1951 Today's Date: 01/29/2021    History of Present Illness Pt is 70 yo admitted 4/14 with chest pain and STEMI with bradycardia and hypotension. Pt taken for urgent cath with temporary pacer placed but pt decompensated to cardiac arrest with intubation and CPR. Stent placed and pt with further complication of cardiogenic shock with Impella placed. 4/18 seizure like activity, Impella removed 4/20, 4/20 MRI with scattered micro embolic infarctions. Extubated 4/24 and reintubated 2 hrs later. Extubated 4/26. Reintubated 01/20/21. Extubated 01/23/21.  PMHx: HTN, HLD, CAD    PT Comments    Patient progressing slowly towards PT goals. Speech seems to be improving since last session however continues to require increased time to process and perform tasks/answer questions appropriately. Noted to have impaired initiation, difficulty sequencing and needs multimodal cues to follow simple commands. Requires max A for bed mobility and Min A for standing attempts using stedy. Worked on standing tolerance and WB through BLEs. Pt fatigues quickly during standing. Recommend use of stedy to transfer pt back to bed with nursing. Due to improvement in speech and mobility, discharge recommendation updated to CIR. Will continue to follow and progress. Will likely be able to initiate gait training next session.     Follow Up Recommendations  CIR     Equipment Recommendations  Wheelchair cushion (measurements PT);3in1 (PT);Wheelchair (measurements PT);Hospital bed    Recommendations for Other Services Rehab consult     Precautions / Restrictions Precautions Precautions: Fall;Other (comment) Precaution Comments: globally aphasic, cortrak Restrictions Weight Bearing Restrictions: No    Mobility  Bed Mobility Overal bed mobility: Needs Assistance Bed Mobility: Rolling;Sidelying to Sit Rolling: Max assist;Mod  assist Sidelying to sit: Max assist;HOB elevated       General bed mobility comments: Step by step cues to reach for rail on left with RUE, turn head and roll; assist with LEs and to elevate trunk.    Transfers Overall transfer level: Needs assistance Equipment used: Ambulation equipment used Transfers: Sit to/from Stand Sit to Stand: Min assist Stand pivot transfers: Total assist       General transfer comment: ASsist to power to standing from EOB using stedy, stood from EOB x1, from stedy seat x5, fatigues quickly. Transferred to chair via stedy.  Ambulation/Gait                 Stairs             Wheelchair Mobility    Modified Rankin (Stroke Patients Only) Modified Rankin (Stroke Patients Only) Pre-Morbid Rankin Score: No symptoms Modified Rankin: Moderately severe disability     Balance Overall balance assessment: Needs assistance Sitting-balance support: Feet supported;No upper extremity supported Sitting balance-Leahy Scale: Fair Sitting balance - Comments: close supervision in sitting but no physical assist needed   Standing balance support: During functional activity Standing balance-Leahy Scale: Poor Standing balance comment: Requires UE support to stand in stedy, worked on weight shifting, favors left side. Fatigues quickly limiting standing tolerance.                            Cognition Arousal/Alertness: Awake/alert Behavior During Therapy: Flat affect Overall Cognitive Status: Difficult to assess Area of Impairment: Problem solving;Following commands;Attention;Memory;Safety/judgement                   Current Attention Level: Sustained Memory: Decreased recall of precautions;Decreased short-term memory Following Commands: Follows one step commands  inconsistently;Follows one step commands with increased time (multi modal cues) Safety/Judgement: Decreased awareness of deficits   Problem Solving: Difficulty  sequencing;Decreased initiation;Slow processing;Requires verbal cues;Requires tactile cues General Comments: Distracted at times; impaired attention. Slow processing and delayed response time t o perform tasks. "it could be worse."  Able to state correct month and year with 2 options otherwise states it is December.      Exercises      General Comments General comments (skin integrity, edema, etc.): VSS on RA.      Pertinent Vitals/Pain Pain Assessment: No/denies pain    Home Living                      Prior Function            PT Goals (current goals can now be found in the care plan section) Acute Rehab PT Goals Patient Stated Goal: to get better PT Goal Formulation: With patient Time For Goal Achievement: 02/12/21 Potential to Achieve Goals: Fair Progress towards PT goals: Progressing toward goals    Frequency    Min 3X/week      PT Plan Discharge plan needs to be updated    Co-evaluation              AM-PAC PT "6 Clicks" Mobility   Outcome Measure  Help needed turning from your back to your side while in a flat bed without using bedrails?: A Lot Help needed moving from lying on your back to sitting on the side of a flat bed without using bedrails?: A Lot Help needed moving to and from a bed to a chair (including a wheelchair)?: Total Help needed standing up from a chair using your arms (e.g., wheelchair or bedside chair)?: A Little Help needed to walk in hospital room?: A Lot Help needed climbing 3-5 steps with a railing? : Total 6 Click Score: 11    End of Session Equipment Utilized During Treatment: Gait belt Activity Tolerance: Patient limited by fatigue Patient left: in chair;with call bell/phone within reach;with chair alarm set Nurse Communication: Mobility status;Other (comment) (stedy) PT Visit Diagnosis: Unsteadiness on feet (R26.81);Muscle weakness (generalized) (M62.81);Hemiplegia and hemiparesis Hemiplegia - Right/Left:  Right Hemiplegia - dominant/non-dominant: Non-dominant Hemiplegia - caused by: Cerebral infarction     Time: 0900-0929 PT Time Calculation (min) (ACUTE ONLY): 29 min  Charges:  $Therapeutic Activity: 23-37 mins                     Marisa Severin, PT, DPT Acute Rehabilitation Services Pager [phone] Office [place] 01/29/2021, 11:51 AM

## 2021-01-29 NOTE — Progress Notes (Signed)
Calorie Count Note  48 hour calorie count ordered.  Diet: dysphagia 1 diet with thin liquids Supplements: Magic cup BID with meals, each supplement provides 290 kcal and 9 grams of protein; Ensure Enlive po BID, each supplement provides 350 kcal and 20 grams of protein  Case discussed with RN and palliative care NP. Pt continues to make good progress. He is more alert, but will sometimes answer questions in appropriately. Eventual discharge plan to SNF. Per RN, plan for SLP to see in the next few days to assess potential for diet advancement.   Spoke with pt at bedside. Pt reports "I want you to take me to [place] so I can get some orangeade". RD re-directed pt. He shares he is trying to eat, but does not like the consistency of food. He shares that he consumed about 50% of breakfast today with assistance of nurse tech. Discussed rationale for continuing TF and cortrak at this time- pt understanding and agreeable. RD also provided gingerale mixed with orange juice per pt request.   5/9 Breakfast: 461 kcals, 18 grams protein Lunch: 581 kcals, 29 grams protein Dinner: 275 kcals, 14 grams protein  Total intake: 1317 kcal (63% of minimum estimated needs)  61 grams protein (55% of minimum estimated needs)  5/10 Breakfast: 421 kcals, 13 grams protein  Nutrition Dx: Inadequate oral intake related to acute illness as evidenced by NPO status; progressing  Goal: Patient will meet greater than or equal to 90% of their needs; progressing   Intervention:   -Continue calorie count -Continue Magic cup BID with meals, each supplement provides 290 kcal and 9 grams of protein -Continue Ensure Enlive po BID, each supplement provides 350 kcal and 20 grams of protein   -Continue nocturnal feedings -Osmolite 1.5 @ 60 ml/hr via cortrak over 12 hour period  90 ml Prosource TF BID.    300 ml free water flush every 2 hours  Tube feeding regimen provides 1120 kcal (53% of needs), 56 grams of  protein, and 547 ml of H2O. Total free water: 4147 ml daily  -Continue 1 packet Nutrisource fiber BID via tube  Loistine Chance, RD, LDN, [place] Registered Dietitian II Certified Diabetes Care and Education Specialist Please refer to [facility] for RD and/or RD on-call/weekend/after hours pager

## 2021-01-29 NOTE — Progress Notes (Signed)
Palliative:  HPI: 70 yo male with PMH for HTN, CAD, arthritis, cataracts, GERD, leg neuropathy admitted 01/03/21 with cardiogenic shock. Hospitalization complicated by VF arrest requiring CPR on multiple occasions. Slow progress with concern for anoxic brain injury but he is now becoming more awake and responsive and interactive. He also required HD briefly and continues to work with SLP on diet.   I met today at Mr. Meenach bedside. No visitors present. He is sitting up in recliner and reports that standing went well and it is good to get out of bed. He does complain of pressure on his bottom in chair and able to shift himself in chair to reposition. We discussed goals to continue to eat/drink well so that he has energy to improve and work with therapy to gain strength. Mr. Sadowsky continues with some confusion as he asks me some questions that did not make sense to me (he asked how school was and asked about [place]?). Otherwise he is stable and showing good signs of progress. Hoping we can avoid PEG placement and that he can continue to advance diet with good intake.   All questions/concerns addressed. Emotional support provided.   Exam: Alert, fluctuating orientation. No distress. Breathing regular, unlabored. Abd soft. Moves all extremities.   Plan: - He continues to show improvements. Will need much time and efforts to rehab. Continue current interventions with hopes of further improvement.   [place], NP Palliative Medicine Team Pager [phone] (Please see [email or web address] for schedule) Team Phone [phone]    Greater than 50%  of this time was spent counseling and coordinating care related to the above assessment and plan

## 2021-01-29 NOTE — Progress Notes (Signed)
  Experiencing loose stool and was observed trying to cut N/G tube with "Butter knife" pt was was easily redirected. 'Stated the its just a noodle and he wanted it out. Has had 2 loose bowel movement and is tolerating Meals without choking however does not like consistency of food and too sweet ensures. Patient has Visitor at the bedside named Elmyra Ricks patient recognized his neighbor right away.

## 2021-01-29 NOTE — Progress Notes (Signed)
Patient ID: [name], male   DOB: Jun 13, 1951, 70 y.o.   MRN: [id number]     Advanced Heart Failure Rounding Note  PCP-Cardiologist: None   Subjective:    Sitting up in chair. Able to have conversation. Weak but can move all 4. Renal function improving. Denies CP or SOB    Objective:   Weight Range: 82.4 kg Body mass index is 23.97 kg/m.   Vital Signs:   Temp:  [98 F (36.7 C)-98.6 F (37 C)] 98 F (36.7 C) (05/10 0814) Pulse Rate:  [83-87] 87 (05/10 0814) Resp:  [16-22] 18 (05/10 0814) BP: (127-141)/(64-77) 141/77 (05/10 0814) SpO2:  [96 %-100 %] 100 % (05/10 0814) Weight:  [82.4 kg] 82.4 kg (05/10 0500) Last BM Date: 01/28/21  Weight change: Filed Weights   01/28/21 0258 01/29/21 0000 01/29/21 0500  Weight: 82.5 kg 82.4 kg 82.4 kg    Intake/Output:   Intake/Output Summary (Last 24 hours) at 01/29/2021 1342 Last data filed at 01/29/2021 1000 Gross per 24 hour  Intake 3160 ml  Output 3100 ml  Net 60 ml      Physical Exam   General:  Sitting in chair No resp difficulty HEENT: normal + Cor -trak  Neck: supple. no JVD. Carotids 2+ bilat; no bruits. No lymphadenopathy or thryomegaly appreciated. Cor: PMI nondisplaced. Regular rate & rhythm. No rubs, gallops or murmurs. Lungs: clear Abdomen: soft, nontender, nondistended. No hepatosplenomegaly. No bruits or masses. Good bowel sounds. Extremities: no cyanosis, clubbing, rash, edema Neuro: alert & orientedx3, cranial nerves grossly intact. moves all 4 extremities w/o difficulty. Affect pleasant   Telemetry   SR 80s Personally reviewed  Labs    CBC Recent Labs    01/27/21 0924 01/29/21 0334  WBC 12.4* 15.2*  HGB 8.6* 8.1*  HCT 26.6* 25.1*  MCV 102.7* 97.3  PLT 285 004   Basic Metabolic Panel Recent Labs    01/27/21 0924 01/28/21 0700 01/29/21 0334  NA 145  --  140  K 3.7  --  3.6  CL 113*  --  108  CO2 23  --  21*  GLUCOSE 142*  --  126*  BUN 100*  --  76*  CREATININE 2.41*  --  2.13*   CALCIUM 8.1*  --  8.2*  MG  --  2.0 2.0  PHOS  --   --  4.6   Liver Function Tests Recent Labs    01/27/21 0924 01/29/21 0334  AST 122*  --   ALT 203*  --   ALKPHOS 119  --   BILITOT 0.6  --   PROT 6.3*  --   ALBUMIN 2.4* 2.3*   No results for input(s): LIPASE, AMYLASE in the last 72 hours. Cardiac Enzymes No results for input(s): CKTOTAL, CKMB, CKMBINDEX, TROPONINI in the last 72 hours.  BNP: BNP (last 3 results) No results for input(s): BNP in the last 8760 hours.  ProBNP (last 3 results) No results for input(s): PROBNP in the last 8760 hours.   D-Dimer No results for input(s): DDIMER in the last 72 hours. Hemoglobin A1C No results for input(s): HGBA1C in the last 72 hours. Fasting Lipid Panel No results for input(s): CHOL, HDL, LDLCALC, TRIG, CHOLHDL, LDLDIRECT in the last 72 hours. Thyroid Function Tests No results for input(s): TSH, T4TOTAL, T3FREE, THYROIDAB in the last 72 hours.  Invalid input(s): FREET3  Other results:   Imaging    No results found.   Medications:     Scheduled Medications: . aspirin  81 mg Per Tube Daily  . chlorhexidine gluconate (MEDLINE KIT)  15 mL Mouth Rinse BID  . Chlorhexidine Gluconate Cloth  6 each Topical Daily  . docusate  100 mg Oral BID  . enoxaparin (LOVENOX) injection  40 mg Subcutaneous Q24H  . feeding supplement  237 mL Oral BID BM  . feeding supplement (PROSource TF)  90 mL Per Tube BID  . fiber  1 packet Oral BID  . hydrALAZINE  12.5 mg Per Tube Q8H  . insulin aspart  0-9 Units Subcutaneous TID AC & HS  . isosorbide dinitrate  5 mg Per Tube TID  . mouth rinse  15 mL Mouth Rinse q12n4p  . modafinil  100 mg Per Tube Daily  . pantoprazole sodium  40 mg Per Tube Daily  . polyethylene glycol  17 g Per Tube BID  . rosuvastatin  10 mg Per Tube Daily  . ticagrelor  90 mg Per Tube BID    Infusions: . cefTAZidime (FORTAZ)  IV 2 g (01/29/21 1320)  . feeding supplement (OSMOLITE 1.5 CAL) 720 mL (01/28/21 2012)     PRN Medications: acetaminophen (TYLENOL) oral liquid 160 mg/5 mL, atropine, ipratropium-albuterol, ondansetron (ZOFRAN) IV, sodium chloride flush    Assessment/Plan   1. CAD/ Acute Inferior STEMI - emergent cath w/ pRCA occlusion s/p PCI + DES - Continue DAPT/statin - no s/s angina  2. VF Arrest - in setting of acute MI, now s/p revascularization  - No further VF. Rhythm stable   3. Acute Systolic Heart Failure>>Cardiogenic Shock  - ICM Echo LVEF 20-25%, RV moderately reduced - Impella out 4/20. - CVVH stopped 4/24.  - Volume status ok. - Consider low dose coreg.  - No b-blocker, ARNI, MRA with AKI  - Continue  Hydral and nitrates. Will increase dose.  - As renal function continues to improve can expand GDMT.    4. Acute hypoxic respiratory failure - 4/24 failed extubation trial.  - 5/1 reintubated due to inability to handle secretions/hemoptysis - 5/4 extubated   5. Nonoliguric AKI - due to ATN/shock - CVVH begun 4/22 and stopped 4/24. Had one session of iHD on 4/30 for clearance - Renal function now improving   6. Anoxic brain injury  - EEG w/ generalized periodic discharges with triphasic morphology + moderate to severe diffuse encephalopathy.  - MRI- numerous micro embolic infarctions . No hemorrhage or mass.  - CIR has turned down due to poor rehab potential. Recommending SNF - MUCH improved! I d/w with PT and we both feel he is appropriate for CIR now. Will place consult and ave them re-evaluate   Length of Stay: 26  Glori Bickers, MD  1:42 PM

## 2021-01-29 NOTE — Progress Notes (Signed)
[facility] NEPHROLOGY PROGRESS NOTE  Assessment/ Plan: Pt is a 70 y.o. yo male with history of HTN, HLD, CAD presented with chest pain found to have STEMI.  He underwent left heart cath, placement of Impella device, had V. fib arrest, mechanical ventilation for respiratory failure and AKI.  #Acute kidney injury due to cardiogenic/hemorrhagic shock.  UA bland and kidney ultrasound unremarkable.  Required CRRT from 4/22-4/24.  Patient is nonoliguric and volume status acceptable.  Received intermittent hemodialysis last on 4/30 for clearance.  After conversations with palliative medicine decision is made for no further dialysis treatments.  Now he is recovering GFR.  He does not need dialysis but I think that as his overall global and neurological status improve he would be a candidate again.      #Hypernatremia due to increase free water excretion and recovering AKI.   improved -  Free water has been stopped  #CAD/acute STEMI required emergent cath with stent placement.  EF 20 to 25%.  AHF following.  #V. fib arrest/cardiogenic shock, acute systolic CHF: Arrest in the setting of acute MI.  Impella was removed.  Now off of pressors or inotropes.  Volume status is acceptable.  Diuretics per cardiology- none at present  #Ventilator dependent respiratory failure: Extubated 01/23/21  #Anemia due to acute blood loss: Required multiple blood transfusion.  Transfuse as needed. Slowly climbing  #Hypokalemia: Potassium level improved.  #Encephalopathy: Monitor, much improved 5/8  Nothing more to add from a nephrology standpoint-  Renal function improving-  Likely still has a long road ahead of him.  Renal will sign off, call with questions  Subjective:     Vital signs are stable, on room air  2.6 L of urine output yesterday,  BUN and creatinine  Improved again-    Objective Vital signs in last 24 hours: Vitals:   01/29/21 0000 01/29/21 0400 01/29/21 0500 01/29/21 0814  BP:  131/68 127/64  (!) 141/77  Pulse: 83 86  87  Resp: 16 16  18   Temp: 98.4 F (36.9 C) 98.6 F (37 C)  98 F (36.7 C)  TempSrc: Oral Oral  Oral  SpO2: 96% 97%  100%  Weight: 82.4 kg  82.4 kg   Height:       Weight change: -0.1 kg  Intake/Output Summary (Last 24 hours) at 01/29/2021 0844 Last data filed at 01/29/2021 0600 Gross per 24 hour  Intake 2220 ml  Output 2100 ml  Net 120 ml       Labs: Basic Metabolic Panel: Recent Labs  Lab 01/26/21 0302 01/27/21 0924 01/29/21 0334  NA 150* 145 140  K 3.6 3.7 3.6  CL 112* 113* 108  CO2 24 23 21*  GLUCOSE 153* 142* 126*  BUN 120* 100* 76*  CREATININE 3.00* 2.41* 2.13*  CALCIUM 8.5* 8.1* 8.2*  PHOS  --   --  4.6   Liver Function Tests: Recent Labs  Lab 01/25/21 0524 01/26/21 0302 01/27/21 0924 01/29/21 0334  AST 40 80* 122*  --   ALT 101* 127* 203*  --   ALKPHOS 127* 135* 119  --   BILITOT 0.8 0.5 0.6  --   PROT 6.7 6.4* 6.3*  --   ALBUMIN 2.4* 2.4* 2.4* 2.3*   No results for input(s): LIPASE, AMYLASE in the last 168 hours. No results for input(s): AMMONIA in the last 168 hours. CBC: Recent Labs  Lab 01/24/21 0359 01/25/21 0524 01/27/21 0924 01/29/21 0334  WBC 22.1* 21.6* 12.4* 15.2*  HGB  8.4* 8.3* 8.6* 8.1*  HCT 26.1* 25.9* 26.6* 25.1*  MCV 97.0 98.9 102.7* 97.3  PLT 387 319 285 271   Cardiac Enzymes: No results for input(s): CKTOTAL, CKMB, CKMBINDEX, TROPONINI in the last 168 hours. CBG: Recent Labs  Lab 01/28/21 1620 01/28/21 2002 01/29/21 0005 01/29/21 0401 01/29/21 0810  GLUCAP 110* 103* 108* 111* 132*    Iron Studies:  No results for input(s): IRON, TIBC, TRANSFERRIN, FERRITIN in the last 72 hours. Studies/Results: DG Swallowing Func-Speech Pathology  Result Date: 01/27/2021 Objective Swallowing Evaluation: Type of Study: MBS-Modified Barium Swallow Study  Patient Details Name: [name] MRN: [id number] Date of Birth: Oct 20, 1950 Today's Date: 01/27/2021 Time: SLP Start Time (ACUTE  ONLY): 1215 -SLP Stop Time (ACUTE ONLY): 1240 SLP Time Calculation (min) (ACUTE ONLY): 25 min Past Medical History: Past Medical History: Diagnosis Date . Anxiety  . Arthritis  . Cancer ([place]) 2010  basal cell ca, head . Cataract   bilateral . Depression  . ED (erectile dysfunction)  . GERD (gastroesophageal reflux disease)  . Hyperlipidemia  . Hypertension  . Kidney stone 07-29-11  passed  . Neuromuscular disorder ([facility])   neuropathy legs . Vitreous floater   left eye, sees Dr. Dawna [facility] at [facility]  Past Surgical History: Past Surgical History: Procedure Laterality Date . BASAL CELL CARCINOMA EXCISION    removed from scalp . COLONOSCOPY  07/03/2017  per Dr. Havery Moros, sessile serrated polyp, repeat in 5 yrs  . HERNIA REPAIR   . LEFT HEART CATH AND CORONARY ANGIOGRAPHY N/A 01/03/2021  Procedure: LEFT HEART CATH AND CORONARY ANGIOGRAPHY;  Surgeon: Troy Sine, MD;  Location: [facility];  Service: Cardiovascular;  Laterality: N/A; . RIGHT HEART CATH N/A 01/03/2021  Procedure: RIGHT HEART CATH;  Surgeon: Jolaine Artist, MD;  Location: [facility];  Service: Cardiovascular;  Laterality: N/A; HPI: Pt is 70 y.o. with PMH of  HTN, HLD, CAD, admitted with chest pain and STEMI with bradycardia and hypotension. Pt taken for urgent cath with temporary pacer placed but pt decompensated to cardiac arrest with intubation and CPR. Stent placed and pt with further complication of cardiogenic shock with Impella placed. 4/18 seizure like activity, Impella removed 4/20, 4/20 MRI with scattered micro embolic infarctions. ETT 4/14-4/24 and reintubated 2 hrs later. Extubated 4/26. EEG (4/23) revealed moderate diffuse encephalopathy, nonspecific etiology. Chest xray (4/24) revealed:"Bibasilar opacities are mildly worsened in the interval worrisome for pneumonia". Failed [facility] Swallow Screen 2/26. Patient with subsequent reintubation on 5/2 with interval extubation by at least 5/6.  Most recent chest xray dated  05/06 was showing no acdtive cardiopulmonary disease.  Subjective: The patient was seen sitting upright in the laterial view for MBS. Assessment / Plan / Recommendation CHL IP CLINICAL IMPRESSIONS 01/27/2021 Clinical Impression MBS was completed using thin liquids via spoon and cup, nectar thick liquids via spoon and cup, pureed material and dual textured solids.  He presented with an oral and pharyngeal dysphagia that was likely at least partially impacted by his mentation.  Intermittent issues with airway protection were noted but they were not consistent and the pentration was trace.  Suggest patient begin a dysphagia 1 (puree) diet with thin liquids.  He should take small, single sips and he may require cues to swallow at times.  Oral cavity needs to be checked to ensure he has cleared material.  Oral care needs to be diligent with a toothbrush and toothpaste at least 2-3 times per day.  ST will follow during  acute stay to initiate swallowing therapy, possible diet advancement and therapeutic diet tolerance.  He may require ST follow up at the next level of care.  See below for information regarding swallowing physiology.  ORAL PHASE  -Slow/decreased lingual motion leading to a delay in A/P transport of boluses.  At times he was noted to orally hold material requiring cues to swallow.  This was particularly evident during pureed trials.  -Good lingual control during directed bolus hold with little premature spillage noted.  -Slow but functional mastication of dual textured solids.  PHARYNGEAL PHASE  -Timely swallow trigger.  Material noted in the vallecula prior to the swallow trigger. -Intermittently reduced laryngeal elevation.  -Reduced base of tongue retraction and pharyngeal stripping led to decreased epiglottic inversion and pharyngeal residue in the vallecula.  Mild-moderate residue was seen given thin and nectar thick liquids as well as pureed material.  Severe vallecula residue was seen given dual  textured solids.  He was not sensate to this residue.  Cued dry swallow was not successful to clear this material.  Liquid wash was able to clear it using nectar thick liquids.  -Trace penetration after the swallow given thin liquids via spoon sips.  There may have been some trace penetration during the swallow given thin liquids via cup sip on 1/2 trials, however, it may have been residue left from the previously penetrated thin liquids via spoon sips.  -Trace penetration during the swallow given 2/3 trials of nectar thick liquids via cup sips.  ESOPHAGEAL PHASE  -Sweep revealed it slow to clear.  SLP Visit Diagnosis Dysphagia, oropharyngeal phase (R13.12) Attention and concentration deficit following -- Frontal lobe and executive function deficit following -- Impact on safety and function Mild aspiration risk   CHL IP TREATMENT RECOMMENDATION 01/27/2021 Treatment Recommendations Therapy as outlined in treatment plan below   Prognosis 01/27/2021 Prognosis for Safe Diet Advancement Good Barriers to Reach Goals Cognitive deficits Barriers/Prognosis Comment -- [facility]L IP DIET RECOMMENDATION 01/27/2021 SLP Diet Recommendations Dysphagia 1 (Puree) solids;Thin liquid Liquid Administration via Cup Medication Administration Crushed with puree Compensations Slow rate;Small sips/bites;Follow solids with liquid Postural Changes Remain semi-upright after after feeds/meals (Comment);Seated upright at 90 degrees   CHL IP OTHER RECOMMENDATIONS 01/27/2021 Recommended Consults -- Oral Care Recommendations Oral care BID;Oral care before and after PO Other Recommendations --   CHL IP FOLLOW UP RECOMMENDATIONS 01/27/2021 Follow up Recommendations Inpatient Rehab   CHL IP FREQUENCY AND DURATION 01/27/2021 Speech Therapy Frequency (ACUTE ONLY) min 2x/week Treatment Duration 2 weeks      CHL IP ORAL PHASE 01/27/2021 Oral Phase Impaired Oral - Pudding Teaspoon -- Oral - Pudding Cup -- Oral - Honey Teaspoon -- Oral - Honey Cup -- Oral - Nectar Teaspoon  Holding of bolus Oral - Nectar Cup Holding of bolus Oral - Nectar Straw -- Oral - Thin Teaspoon Holding of bolus;Premature spillage Oral - Thin Cup Holding of bolus;Premature spillage Oral - Thin Straw -- Oral - Puree Holding of bolus Oral - Mech Soft Delayed oral transit Oral - Regular -- Oral - Multi-Consistency -- Oral - Pill -- Oral Phase - Comment --  CHL IP PHARYNGEAL PHASE 01/27/2021 Pharyngeal Phase Impaired Pharyngeal- Pudding Teaspoon -- Pharyngeal -- Pharyngeal- Pudding Cup -- Pharyngeal -- Pharyngeal- Honey Teaspoon -- Pharyngeal -- Pharyngeal- Honey Cup -- Pharyngeal -- Pharyngeal- Nectar Teaspoon Reduced epiglottic inversion;Reduced laryngeal elevation;Reduced tongue base retraction;Pharyngeal residue - valleculae Pharyngeal -- Pharyngeal- Nectar Cup Reduced laryngeal elevation;Reduced tongue base retraction;Pharyngeal residue - valleculae Pharyngeal -- Pharyngeal- Nectar Straw --  Pharyngeal -- Pharyngeal- Thin Teaspoon Reduced epiglottic inversion;Reduced laryngeal elevation;Penetration/Apiration after swallow;Pharyngeal residue - valleculae Pharyngeal Material enters airway, remains ABOVE vocal cords and not ejected out Pharyngeal- Thin Cup Reduced epiglottic inversion;Reduced laryngeal elevation;Reduced tongue base retraction;Pharyngeal residue - valleculae;Penetration/Aspiration during swallow Pharyngeal Material enters airway, remains ABOVE vocal cords and not ejected out Pharyngeal- Thin Straw -- Pharyngeal -- Pharyngeal- Puree Pharyngeal residue - valleculae Pharyngeal -- Pharyngeal- Mechanical Soft Reduced epiglottic inversion;Reduced laryngeal elevation;Reduced tongue base retraction;Pharyngeal residue - valleculae Pharyngeal -- Pharyngeal- Regular -- Pharyngeal -- Pharyngeal- Multi-consistency -- Pharyngeal -- Pharyngeal- Pill -- Pharyngeal -- Pharyngeal Comment --  CHL IP CERVICAL ESOPHAGEAL PHASE 01/27/2021 Cervical Esophageal Phase WFL Pudding Teaspoon -- Pudding Cup -- Honey Teaspoon -- Honey  Cup -- Nectar Teaspoon -- Nectar Cup -- Nectar Straw -- Thin Teaspoon -- Thin Cup -- Thin Straw -- Puree -- Mechanical Soft -- Regular -- Multi-consistency -- Pill -- Cervical Esophageal Comment -- Shelly Flatten, MA, CCC-SLP Acute Rehab SLP [phone] Lamar Sprinkles 01/27/2021, 1:28 PM               Medications: Infusions: . cefTAZidime (FORTAZ)  IV 2 g (01/28/21 2135)  . feeding supplement (OSMOLITE 1.5 CAL) 720 mL (01/28/21 2012)    Scheduled Medications: . aspirin  81 mg Per Tube Daily  . chlorhexidine gluconate (MEDLINE KIT)  15 mL Mouth Rinse BID  . Chlorhexidine Gluconate Cloth  6 each Topical Daily  . docusate  100 mg Oral BID  . enoxaparin (LOVENOX) injection  40 mg Subcutaneous Q24H  . feeding supplement  237 mL Oral BID BM  . feeding supplement (PROSource TF)  90 mL Per Tube BID  . fiber  1 packet Oral BID  . hydrALAZINE  12.5 mg Per Tube Q8H  . insulin aspart  0-9 Units Subcutaneous Q4H  . isosorbide dinitrate  5 mg Per Tube TID  . mouth rinse  15 mL Mouth Rinse q12n4p  . modafinil  100 mg Per Tube Daily  . pantoprazole sodium  40 mg Per Tube Daily  . polyethylene glycol  17 g Per Tube BID  . rosuvastatin  10 mg Per Tube Daily  . ticagrelor  90 mg Per Tube BID    have reviewed scheduled and prn medications.  Physical Exam: General:  awake, alert, interactive Heart:RRR, s1s2 nl, no rubs Lungs: Coarse breath sound bilateral Abdomen:soft,  non-distended Extremities:No edema  Lithzy Bernard A Amir Glaus 01/29/2021,8:44 AM  LOS: 26 days

## 2021-01-30 DIAGNOSIS — Z515 Encounter for palliative care: Secondary | ICD-10-CM | POA: Diagnosis not present

## 2021-01-30 DIAGNOSIS — G9340 Encephalopathy, unspecified: Secondary | ICD-10-CM | POA: Diagnosis not present

## 2021-01-30 DIAGNOSIS — R570 Cardiogenic shock: Secondary | ICD-10-CM | POA: Diagnosis not present

## 2021-01-30 DIAGNOSIS — J9601 Acute respiratory failure with hypoxia: Secondary | ICD-10-CM | POA: Diagnosis not present

## 2021-01-30 DIAGNOSIS — Z7189 Other specified counseling: Secondary | ICD-10-CM | POA: Diagnosis not present

## 2021-01-30 DIAGNOSIS — N170 Acute kidney failure with tubular necrosis: Secondary | ICD-10-CM | POA: Diagnosis not present

## 2021-01-30 LAB — CATH TIP CULTURE: Culture: 3000 — AB

## 2021-01-30 LAB — BASIC METABOLIC PANEL
Anion gap: 10 (ref 5–15)
BUN: 64 mg/dL — ABNORMAL HIGH (ref 8–23)
CO2: 20 mmol/L — ABNORMAL LOW (ref 22–32)
Calcium: 8.3 mg/dL — ABNORMAL LOW (ref 8.9–10.3)
Chloride: 109 mmol/L (ref 98–111)
Creatinine, Ser: 1.96 mg/dL — ABNORMAL HIGH (ref 0.61–1.24)
GFR, Estimated: 36 mL/min — ABNORMAL LOW (ref >60)
Glucose, Bld: 119 mg/dL — ABNORMAL HIGH (ref 70–99)
Potassium: 3.8 mmol/L (ref 3.5–5.1)
Sodium: 139 mmol/L (ref 135–145)

## 2021-01-30 LAB — CULTURE, BLOOD (ROUTINE X 2)
Culture: NO GROWTH
Culture: NO GROWTH
Special Requests: ADEQUATE

## 2021-01-30 LAB — GLUCOSE, CAPILLARY
Glucose-Capillary: 103 mg/dL — ABNORMAL HIGH (ref 70–99)
Glucose-Capillary: 106 mg/dL — ABNORMAL HIGH (ref 70–99)
Glucose-Capillary: 143 mg/dL — ABNORMAL HIGH (ref 70–99)
Glucose-Capillary: 114 mg/dL — ABNORMAL HIGH (ref 70–99)

## 2021-01-30 LAB — MAGNESIUM: Magnesium: 1.9 mg/dL (ref 1.7–2.4)

## 2021-01-30 LAB — CBC
HCT: 25.9 % — ABNORMAL LOW (ref 39.0–52.0)
Hemoglobin: 8.2 g/dL — ABNORMAL LOW (ref 13.0–17.0)
MCH: 30 pg (ref 26.0–34.0)
MCHC: 31.7 g/dL (ref 30.0–36.0)
MCV: 94.9 fL (ref 80.0–100.0)
Platelets: 231 K/uL (ref 150–400)
RBC: 2.73 MIL/uL — ABNORMAL LOW (ref 4.22–5.81)
RDW: 16.7 % — ABNORMAL HIGH (ref 11.5–15.5)
WBC: 16.4 K/uL — ABNORMAL HIGH (ref 4.0–10.5)
nRBC: 0 % (ref 0.0–0.2)

## 2021-01-30 LAB — PHOSPHORUS: Phosphorus: 3.8 mg/dL (ref 2.5–4.6)

## 2021-01-30 MED ORDER — POTASSIUM CHLORIDE CRYS ER 20 MEQ PO TBCR
20.0000 meq | EXTENDED_RELEASE_TABLET | Freq: Once | ORAL | Status: AC
  Administered 2021-01-30: 20 meq via ORAL
  Filled 2021-01-30: qty 1

## 2021-01-30 MED ORDER — OSMOLITE 1.5 CAL PO LIQD
720.0000 mL | ORAL | Status: DC
  Administered 2021-01-30 – 2021-01-31 (×2): 720 mL
  Filled 2021-01-30 (×3): qty 948

## 2021-01-30 MED ORDER — SPIRONOLACTONE 12.5 MG HALF TABLET
12.5000 mg | ORAL_TABLET | Freq: Every day | ORAL | Status: DC
  Administered 2021-01-30 – 2021-02-07 (×9): 12.5 mg via ORAL
  Filled 2021-01-30 (×8): qty 1

## 2021-01-30 MED ORDER — ZINC OXIDE 12.8 % EX OINT
TOPICAL_OINTMENT | CUTANEOUS | Status: DC | PRN
  Filled 2021-01-30: qty 56.7

## 2021-01-30 NOTE — Progress Notes (Addendum)
[facility]  PROGRESS NOTE  [name] WUJ:[id number] DOB: 1951-08-26 DOA: 01/03/2021 PCP: [name], MD   Brief HPI:   70 year old male with past medical history of hypertension, hyperlipidemia, CAD who was admitted with ST relation MI and cardiac arrest prior to coming to hospital.  He underwent emergent cardiac catheterization and PCI with stent to proximal RCA.  Ejection fraction was noted to be 25 to 30%.  Patient did have prolonged ICU stay for cardiogenic/hemorrhagic shock requiring vasopressors and transfusion.  He suffered from diminished mental status and MRI showed scattered punctate infarction concerning for anoxic injury.  Also had acute kidney injury and required CRRT and was subsequently transitioned to intermittent hemodialysis.  Patient was extubated on 01/15/2021 and reintubated on 01/20/2021.  Subsequently was extubated on 01/23/2021 and was transferred out of ICU.  Patient's mental status has significantly improved.  Palliative care was consulted and healthcare power of attorney decided not to escalate care and made him DNR.  Nephrology and cardiology have been following the patient.  Significant Events: 4/20 Impella(heart pump) removed. MRI brain with numerous micro embolic infarctions. Off pressors.  4/21 lasix drip stopped.  4/22 started CVVH 4/24 CVVH stopped. Failed extubation. Re-intubated.  4/26 Extubated  5/1 Re-intubated 5/4 Extubated, off pressors    Subjective   Patient seen and examined, answering questions appropriately.  Denies chest pain or shortness of breath.   Assessment/Plan:     1. CAD/acute inferior STEMI-patient underwent emergent cardiac catheterization with PCI and drug-eluting stent to right coronary artery.  Continue DAPT, statin.  Patient not on beta-blocker or ARB due to acute kidney injury.  Continue hydralazine and nitrates. 2. Acute systolic heart failure/cardiogenic shock-patient's EF was found to be 25 to 30%, he underwent  CRRT.  Currently hemodialysis has been stopped.  Patient slowly improving.  Continue hydralazine, isosorbide, spironolactone. 3. S/p ventricular fibrillation arrest-s/p anoxic brain injury.  Cognition has improved.  Patient current status is DNR. 4. Fever with leukocytosis-likely secondary to pseudomonal UTI.  Hemodialysis catheter was removed including Foley catheter.  UA showed some pyuria.  Urine culture grew Pseudomonas.  Patient was initially started on Rocephin which was changed to ceftazidime.  Hemodialysis catheter tip culture showed 3000 colonies of staph epidermidis.  Blood cultures have been negative in 4 days.  Chest x-ray showed minimal bibasilar atelectasis.  Fever has resolved.  WBC is 16,000 today.  We will check with ID regarding treating MRSE. 5. Nonoliguric AKI-likely from ATN and cardiogenic shock.  Creatinine has improved to 1.96.  No further hemodialysis plan per nephrology.  HD catheter has been removed. 6. Anoxic brain injury-MRI brain showed microembolic infarcts and EEG showed triphasic morphology with diffuse encephalopathy.  Patient has alert and communicating at this time.  Significantly improved.  Continue Provigil. 7. Hypernatremia-receiving free water through core track feeding tube.  Sodium level has improved to 139. 8. Nutrition-patient is on core track feeding tube.  Heart healthy diet.    Scheduled medications:   . aspirin  81 mg Per Tube Daily  . chlorhexidine gluconate (MEDLINE KIT)  15 mL Mouth Rinse BID  . Chlorhexidine Gluconate Cloth  6 each Topical Daily  . enoxaparin (LOVENOX) injection  40 mg Subcutaneous Q24H  . feeding supplement  237 mL Oral BID BM  . feeding supplement (PROSource TF)  90 mL Per Tube BID  . fiber  1 packet Oral BID  . hydrALAZINE  25 mg Per Tube Q8H  . insulin aspart  0-9 Units Subcutaneous TID AC &  HS  . isosorbide dinitrate  10 mg Per Tube TID  . mouth rinse  15 mL Mouth Rinse q12n4p  . modafinil  100 mg Per Tube Daily  .  pantoprazole sodium  40 mg Per Tube Daily  . rosuvastatin  10 mg Per Tube Daily  . spironolactone  12.5 mg Oral Daily  . ticagrelor  90 mg Per Tube BID         Data Reviewed:   CBG:  Recent Labs  Lab 01/29/21 1641 01/29/21 2039 01/30/21 0559 01/30/21 1140 01/30/21 1615  GLUCAP 92 98 103* 106* 143*    SpO2: 100 % O2 Flow Rate (L/min): 2 L/min FiO2 (%): 30 %    Vitals:   01/30/21 0332 01/30/21 0554 01/30/21 1300 01/30/21 1619  BP: 114/64 (!) 131/97 118/63 140/76  Pulse: 86  86 88  Resp: (!) 24  18 20   Temp: 98.4 F (36.9 C)  98 F (36.7 C) 98 F (36.7 C)  TempSrc: Oral  Oral Oral  SpO2: 98%  98% 100%  Weight: 83.6 kg     Height:         Intake/Output Summary (Last 24 hours) at 01/30/2021 1816 Last data filed at 01/30/2021 1629 Gross per 24 hour  Intake 1420.06 ml  Output 1977 ml  Net -556.94 ml    05/09 1901 - 05/11 0700 In: 1528 [P.O.:240] Out: 3400 [Urine:2800]  Filed Weights   01/29/21 0000 01/29/21 0500 01/30/21 0332  Weight: 82.4 kg 82.4 kg 83.6 kg    CBC:  Recent Labs  Lab 01/24/21 0359 01/25/21 0524 01/27/21 0924 01/29/21 0334 01/30/21 0303  WBC 22.1* 21.6* 12.4* 15.2* 16.4*  HGB 8.4* 8.3* 8.6* 8.1* 8.2*  HCT 26.1* 25.9* 26.6* 25.1* 25.9*  PLT 387 319 285 271 231  MCV 97.0 98.9 102.7* 97.3 94.9  MCH 31.2 31.7 33.2 31.4 30.0  MCHC 32.2 32.0 32.3 32.3 31.7  RDW 17.8* 18.3* 17.6* 16.5* 16.7*    Complete metabolic panel:  Recent Labs  Lab 01/24/21 0359 01/25/21 0524 01/26/21 0302 01/27/21 0202 01/27/21 0924 01/28/21 0700 01/29/21 0334 01/30/21 0303  NA 146* 150* 150*  --  145  --  140 139  K 3.8 3.6 3.6  --  3.7  --  3.6 3.8  CL 111 112* 112*  --  113*  --  108 109  CO2 25 26 24   --  23  --  21* 20*  GLUCOSE 177* 179* 153*  --  142*  --  126* 119*  BUN 111* 122* 120*  --  100*  --  76* 64*  CREATININE 2.90* 3.13* 3.00*  --  2.41*  --  2.13* 1.96*  CALCIUM 8.6* 8.6* 8.5*  --  8.1*  --  8.2* 8.3*  AST 69* 40 80*  --   122*  --   --   --   ALT 130* 101* 127*  --  203*  --   --   --   ALKPHOS 140* 127* 135*  --  119  --   --   --   BILITOT 0.9 0.8 0.5  --  0.6  --   --   --   ALBUMIN 2.5* 2.4* 2.4*  --  2.4*  --  2.3*  --   MG 2.3 2.4 2.5* 2.3  --  2.0 2.0 1.9    No results for input(s): LIPASE, AMYLASE in the last 168 hours.  No results for input(s): CRP, DDIMER, BNP, PROCALCITON, SARSCOV2NAA in the  last 168 hours.  Invalid input(s): LACTICACID  ------------------------------------------------------------------------------------------------------------------ No results for input(s): CHOL, HDL, LDLCALC, TRIG, CHOLHDL, LDLDIRECT in the last 72 hours.  Lab Results  Component Value Date   HGBA1C 5.7 (H) 01/06/2021   ------------------------------------------------------------------------------------------------------------------ No results for input(s): TSH, T4TOTAL, T3FREE, THYROIDAB in the last 72 hours.  Invalid input(s): FREET3 ------------------------------------------------------------------------------------------------------------------ No results for input(s): VITAMINB12, FOLATE, FERRITIN, TIBC, IRON, RETICCTPCT in the last 72 hours.  Coagulation profile No results for input(s): INR, PROTIME in the last 168 hours. No results for input(s): DDIMER in the last 72 hours.  Cardiac Enzymes No results for input(s): CKTOTAL, CKMB, CKMBINDEX, TROPONINI in the last 168 hours.  ------------------------------------------------------------------------------------------------------------------ No results found for: BNP   Antibiotics: Anti-infectives (From admission, onward)   Start     Dose/Rate Route Frequency Ordered Stop   01/27/21 2200  cefTAZidime (FORTAZ) 2 g in sodium chloride 0.9 % 100 mL IVPB        2 g 200 mL/hr over 30 Minutes Intravenous Every 12 hours 01/27/21 1403     01/27/21 1500  cefTAZidime (FORTAZ) 2 g in sodium chloride 0.9 % 100 mL IVPB  Status:  Discontinued        2 g 200  mL/hr over 30 Minutes Intravenous Every 12 hours 01/27/21 1402 01/27/21 1403   01/25/21 2200  cefTRIAXone (ROCEPHIN) 1 g in sodium chloride 0.9 % 100 mL IVPB  Status:  Discontinued       Note to Pharmacy: Dose was documented as given earlier this evening but IV became disconnected at some point and RN thinks he may not have received the dose   1 g 200 mL/hr over 30 Minutes Intravenous Every 24 hours 01/25/21 2107 01/27/21 1402   01/25/21 1830  cefTRIAXone (ROCEPHIN) 1 g in sodium chloride 0.9 % 100 mL IVPB  Status:  Discontinued        1 g 200 mL/hr over 30 Minutes Intravenous Every 24 hours 01/25/21 1740 01/25/21 2107   01/13/21 1400  piperacillin-tazobactam (ZOSYN) IVPB 3.375 g  Status:  Discontinued        3.375 g 12.5 mL/hr over 240 Minutes Intravenous Every 8 hours 01/13/21 1030 01/15/21 0813   01/13/21 1200  piperacillin-tazobactam (ZOSYN) IVPB 3.375 g  Status:  Discontinued        3.375 g 100 mL/hr over 30 Minutes Intravenous Every 6 hours 01/13/21 0955 01/13/21 1030   01/13/21 1045  ceFEPIme (MAXIPIME) 2 g in sodium chloride 0.9 % 100 mL IVPB  Status:  Discontinued        2 g 200 mL/hr over 30 Minutes Intravenous Every 12 hours 01/13/21 0949 01/13/21 0954   01/08/21 1100  cefTRIAXone (ROCEPHIN) 2 g in sodium chloride 0.9 % 100 mL IVPB  Status:  Discontinued        2 g 200 mL/hr over 30 Minutes Intravenous Every 24 hours 01/07/21 1244 01/10/21 1012   01/08/21 0600  vancomycin (VANCOREADY) IVPB 1250 mg/250 mL  Status:  Discontinued        1,250 mg 166.7 mL/hr over 90 Minutes Intravenous Every 48 hours 01/06/21 2202 01/08/21 1010   01/07/21 1108  ceFEPIme (MAXIPIME) 2 g in sodium chloride 0.9 % 100 mL IVPB  Status:  Discontinued        2 g 200 mL/hr over 30 Minutes Intravenous Every 24 hours 01/06/21 2202 01/07/21 1244   01/06/21 1200  vancomycin (VANCOREADY) IVPB 1000 mg/200 mL  Status:  Discontinued        1,000 mg 200  mL/hr over 60 Minutes Intravenous Every 24 hours 01/05/21 2209  01/06/21 2202   01/06/21 1200  ceFEPIme (MAXIPIME) 2 g in sodium chloride 0.9 % 100 mL IVPB  Status:  Discontinued        2 g 200 mL/hr over 30 Minutes Intravenous Every 12 hours 01/05/21 2209 01/06/21 2202   01/04/21 1400  ceFEPIme (MAXIPIME) 2 g in sodium chloride 0.9 % 100 mL IVPB        2 g 200 mL/hr over 30 Minutes Intravenous Every 8 hours 01/04/21 1335 01/05/21 2231   01/04/21 1000  ceFEPIme (MAXIPIME) 2 g in sodium chloride 0.9 % 100 mL IVPB  Status:  Discontinued        2 g 200 mL/hr over 30 Minutes Intravenous Every 12 hours 01/03/21 2131 01/04/21 1335   01/04/21 0600  vancomycin (VANCOREADY) IVPB 750 mg/150 mL  Status:  Discontinued        750 mg 150 mL/hr over 60 Minutes Intravenous Every 12 hours 01/03/21 1436 01/05/21 2209   01/03/21 2200  vancomycin (VANCOREADY) IVPB 750 mg/150 mL  Status:  Discontinued        750 mg 150 mL/hr over 60 Minutes Intravenous Every 12 hours 01/03/21 1241 01/03/21 1436   01/03/21 1400  ceFEPIme (MAXIPIME) 2 g in sodium chloride 0.9 % 100 mL IVPB  Status:  Discontinued        2 g 200 mL/hr over 30 Minutes Intravenous Every 8 hours 01/03/21 1238 01/03/21 2131   01/03/21 1330  vancomycin (VANCOREADY) IVPB 1250 mg/250 mL        1,250 mg 166.7 mL/hr over 90 Minutes Intravenous  Once 01/03/21 1241 01/03/21 1640       Radiology Reports  No results found.    DVT prophylaxis: Lovenox  Code Status: Full code  Family Communication: No family at bedside   Consultants:  PCCM  Cardiology  Nephrology  Procedures:  Intubation  Cardiac catheterization  CRRT    Objective    Physical Examination:    General-appears in no acute distress, core track feeding tube in place  Heart-S1-S2, regular, no murmur auscultated  Lungs-clear to auscultation bilaterally, no wheezing or crackles auscultated  Abdomen-soft, nontender, no organomegaly  Extremities-no edema in the lower extremities  Neuro-alert, oriented x3, no focal deficit  noted   Status is: Inpatient  Dispo: The patient is from: Home              Anticipated d/c is to: Skilled nursing facility versus inpatient rehab              Anticipated d/c date is: 02/01/2021              Patient currently not stable for discharge  Barrier to discharge-awaiting rehab evaluation  COVID-19 Labs  No results for input(s): DDIMER, FERRITIN, LDH, CRP in the last 72 hours.  Lab Results  Component Value Date   [facility] NEGATIVE 01/03/2021    Microbiology  Recent Results (from the past 240 hour(s))  Culture, Urine     Status: Abnormal   Collection Time: 01/25/21  8:48 AM   Specimen: Urine, Catheterized  Result Value Ref Range Status   Specimen Description URINE, CATHETERIZED  Final   Special Requests   Final    NONE Performed at [facility], [address], [place], [place] [address]    Culture 80,000 COLONIES/mL PSEUDOMONAS AERUGINOSA (A)  Final   Report Status 01/27/2021 FINAL  Final   Organism ID, Bacteria PSEUDOMONAS AERUGINOSA (  A)  Final      Susceptibility   Pseudomonas aeruginosa - MIC*    CEFTAZIDIME 4 SENSITIVE Sensitive     CIPROFLOXACIN <=0.25 SENSITIVE Sensitive     GENTAMICIN <=1 SENSITIVE Sensitive     IMIPENEM 2 SENSITIVE Sensitive     PIP/TAZO 16 SENSITIVE Sensitive     CEFEPIME 2 SENSITIVE Sensitive     * 80,000 COLONIES/mL PSEUDOMONAS AERUGINOSA  Culture, blood (routine x 2)     Status: None   Collection Time: 01/25/21 10:53 AM   Specimen: BLOOD LEFT FOREARM  Result Value Ref Range Status   Specimen Description BLOOD LEFT FOREARM  Final   Special Requests   Final    BOTTLES DRAWN AEROBIC ONLY Blood Culture results may not be optimal due to an excessive volume of blood received in culture bottles   Culture   Final    NO GROWTH 5 DAYS Performed at [facility], [place] [address], [place], [place] [address]    Report Status 01/30/2021 FINAL  Final  Culture, blood (routine x 2)     Status: None   Collection Time: 01/25/21  10:53 AM   Specimen: BLOOD RIGHT FOREARM  Result Value Ref Range Status   Specimen Description BLOOD RIGHT FOREARM  Final   Special Requests   Final    BOTTLES DRAWN AEROBIC ONLY Blood Culture adequate volume   Culture   Final    NO GROWTH 5 DAYS Performed at [facility], [place] [address] [place], [place] [address]    Report Status 01/30/2021 FINAL  Final  Cath Tip Culture     Status: Abnormal   Collection Time: 01/25/21  8:00 PM   Specimen: Catheter Tip; Other  Result Value Ref Range Status   Specimen Description CATH TIP  Final   Special Requests   Final    NONE Performed at [facility], [place] [address], [place], [place] [address]    Culture 3,000 COLONIES/mL STAPHYLOCOCCUS EPIDERMIDIS (A)  Final   Report Status 01/30/2021 FINAL  Final   Organism ID, Bacteria STAPHYLOCOCCUS EPIDERMIDIS (A)  Final      Susceptibility   Staphylococcus epidermidis - MIC*    CIPROFLOXACIN <=0.5 SENSITIVE Sensitive     ERYTHROMYCIN <=0.25 SENSITIVE Sensitive     GENTAMICIN <=0.5 SENSITIVE Sensitive     OXACILLIN >=4 RESISTANT Resistant     TETRACYCLINE 2 SENSITIVE Sensitive     VANCOMYCIN 1 SENSITIVE Sensitive     TRIMETH/SULFA <=10 SENSITIVE Sensitive     CLINDAMYCIN <=0.25 SENSITIVE Sensitive     RIFAMPIN <=0.5 SENSITIVE Sensitive     Inducible Clindamycin NEGATIVE Sensitive     * 3,000 COLONIES/mL STAPHYLOCOCCUS EPIDERMIDIS    Pressure Injury 01/20/21 Heel Left Deep Tissue Pressure Injury - Purple or maroon localized area of discolored intact skin or blood-filled blister due to damage of underlying soft tissue from pressure and/or shear. (Active)  01/20/21 0100  Location: Heel  Location Orientation: Left  Staging: Deep Tissue Pressure Injury - Purple or maroon localized area of discolored intact skin or blood-filled blister due to damage of underlying soft tissue from pressure and/or shear.  Wound Description (Comments):   Present on Admission: No          [facility] If 7PM-7AM, please contact night-coverage at [email or web address], Office  [phone]   01/30/2021, 6:16 PM  LOS: 27 days

## 2021-01-30 NOTE — Progress Notes (Signed)
Patient ID: [name], male   DOB: 12-24-1950, 70 y.o.   MRN: [id number]     Advanced Heart Failure Rounding Note  PCP-Cardiologist: None   Subjective:    Sitting up in chair. Communicative. Denies dyspnea or chest pain.  Having diarrhea from TFs, stool softeners held.  Renal function improving.  Weight up a bit.     Objective:   Weight Range: 83.6 kg Body mass index is 24.32 kg/m.   Vital Signs:   Temp:  [97.3 F (36.3 C)-98.4 F (36.9 C)] 98.4 F (36.9 C) (05/11 0332) Pulse Rate:  [79-86] 86 (05/11 0332) Resp:  [16-24] 24 (05/11 0332) BP: (100-131)/(64-97) 131/97 (05/11 0554) SpO2:  [97 %-99 %] 98 % (05/11 0332) Weight:  [83.6 kg] 83.6 kg (05/11 0332) Last BM Date: 01/29/21  Weight change: Filed Weights   01/29/21 0000 01/29/21 0500 01/30/21 0332  Weight: 82.4 kg 82.4 kg 83.6 kg    Intake/Output:   Intake/Output Summary (Last 24 hours) at 01/30/2021 0915 Last data filed at 01/30/2021 0753 Gross per 24 hour  Intake 1180 ml  Output 2377 ml  Net -1197 ml      Physical Exam   General:  Sitting in chair No resp difficulty HEENT: normal + Cor -trak  Neck: supple. JVD 8. Carotids 2+ bilat; no bruits. No lymphadenopathy or thryomegaly appreciated. Cor: PMI nondisplaced. Regular rate & rhythm. No rubs, gallops or murmurs. Lungs: faint bibasilar rales Abdomen: soft, nontender, nondistended. No hepatosplenomegaly. No bruits or masses. Good bowel sounds. Extremities: no cyanosis, clubbing, rash, trace edema Neuro: alert,  cranial nerves grossly intact. moves all 4 extremities w/o difficulty. Affect pleasant   Telemetry   SR 80s Personally reviewed  Labs    CBC Recent Labs    01/29/21 0334 01/30/21 0303  WBC 15.2* 16.4*  HGB 8.1* 8.2*  HCT 25.1* 25.9*  MCV 97.3 94.9  PLT 271 086   Basic Metabolic Panel Recent Labs    01/29/21 0334 01/30/21 0303  NA 140 139  K 3.6 3.8  CL 108 109  CO2 21* 20*  GLUCOSE 126* 119*  BUN 76* 64*  CREATININE  2.13* 1.96*  CALCIUM 8.2* 8.3*  MG 2.0 1.9  PHOS 4.6 3.8   Liver Function Tests Recent Labs    01/27/21 0924 01/29/21 0334  AST 122*  --   ALT 203*  --   ALKPHOS 119  --   BILITOT 0.6  --   PROT 6.3*  --   ALBUMIN 2.4* 2.3*   No results for input(s): LIPASE, AMYLASE in the last 72 hours. Cardiac Enzymes No results for input(s): CKTOTAL, CKMB, CKMBINDEX, TROPONINI in the last 72 hours.  BNP: BNP (last 3 results) No results for input(s): BNP in the last 8760 hours.  ProBNP (last 3 results) No results for input(s): PROBNP in the last 8760 hours.   D-Dimer No results for input(s): DDIMER in the last 72 hours. Hemoglobin A1C No results for input(s): HGBA1C in the last 72 hours. Fasting Lipid Panel No results for input(s): CHOL, HDL, LDLCALC, TRIG, CHOLHDL, LDLDIRECT in the last 72 hours. Thyroid Function Tests No results for input(s): TSH, T4TOTAL, T3FREE, THYROIDAB in the last 72 hours.  Invalid input(s): FREET3  Other results:   Imaging    No results found.   Medications:     Scheduled Medications: . aspirin  81 mg Per Tube Daily  . chlorhexidine gluconate (MEDLINE KIT)  15 mL Mouth Rinse BID  . Chlorhexidine Gluconate Cloth  6 each  Topical Daily  . docusate  100 mg Oral BID  . enoxaparin (LOVENOX) injection  40 mg Subcutaneous Q24H  . feeding supplement  237 mL Oral BID BM  . feeding supplement (PROSource TF)  90 mL Per Tube BID  . fiber  1 packet Oral BID  . hydrALAZINE  25 mg Per Tube Q8H  . insulin aspart  0-9 Units Subcutaneous TID AC & HS  . isosorbide dinitrate  10 mg Per Tube TID  . mouth rinse  15 mL Mouth Rinse q12n4p  . modafinil  100 mg Per Tube Daily  . pantoprazole sodium  40 mg Per Tube Daily  . polyethylene glycol  17 g Per Tube BID  . rosuvastatin  10 mg Per Tube Daily  . ticagrelor  90 mg Per Tube BID    Infusions: . cefTAZidime (FORTAZ)  IV 2 g (01/29/21 2258)  . feeding supplement (OSMOLITE 1.5 CAL) 720 mL (01/28/21 2012)     PRN Medications: acetaminophen (TYLENOL) oral liquid 160 mg/5 mL, atropine, ipratropium-albuterol, ondansetron (ZOFRAN) IV, sodium chloride flush    Assessment/Plan   1. CAD/ Acute Inferior STEMI - emergent cath w/ pRCA occlusion s/p PCI + DES - Continue DAPT/statin - no s/s angina  2. VF Arrest - in setting of acute MI, now s/p revascularization  - No further VF. Rhythm stable   3. Acute Systolic Heart Failure>>Cardiogenic Shock  - ICM Echo LVEF 20-25%, RV moderately reduced - Impella out 4/20. - CVVH stopped 4/24.  - Starting to retain a small amount of volume - As renal function continues to improve can expand GDMT.  - Continue  Hydral and nitrates - Add spiro 12.5   4. Acute hypoxic respiratory failure - 4/24 failed extubation trial.  - 5/1 reintubated due to inability to handle secretions/hemoptysis - 5/4 extubated   5. Nonoliguric AKI - due to ATN/shock - CVVH begun 4/22 and stopped 4/24. Had one session of iHD on 4/30 for clearance - Renal function now improving   6. Anoxic brain injury  - EEG w/ generalized periodic discharges with triphasic morphology + moderate to severe diffuse encephalopathy.  - MRI- numerous micro embolic infarctions . No hemorrhage or mass.  - CIR has turned down due to poor rehab potential. Recommending SNF - MUCH improved! d/w with PT and  feel he is appropriate for CIR now. Consult placed 5/10 awaiting evaluation   Length of Stay: 27  Katherine Roan, MD  9:15 AM   Patient seen and examined with the above-signed Advanced Practice Provider and/or Housestaff. I personally reviewed laboratory data, imaging studies and relevant notes. I independently examined the patient and formulated the important aspects of the plan. I have edited the note to reflect any of my changes or salient points. I have personally discussed the plan with the patient and/or family.  Stable from cardiac perspective. Weight increasing slightly. Denies CP or  SOB.   General: Sitting up. No resp difficulty HEENT: normal + cor-trak Neck: supple. JVP 7 Carotids 2+ bilat; no bruits. No lymphadenopathy or thryomegaly appreciated. Cor: PMI nondisplaced. Regular rate & rhythm. No rubs, gallops or murmurs. Lungs: clear Abdomen: soft, nontender, nondistended. No hepatosplenomegaly. No bruits or masses. Good bowel sounds. Extremities: no cyanosis, clubbing, rash, tr edema Neuro: alert & orientedx3, cranial nerves grossly intact. moves all 4 extremities w/o difficulty. Affect pleasant  Stable from cardiac perspective. Renal function improving. Functional status improving. Agree with careful addition of spiro. Hold of on SGLT2i for now with high-risk of  UTI. May be able to add ARB/ARNI. Await CIR eval.   Glori Bickers, MD  11:51 AM

## 2021-01-30 NOTE — Progress Notes (Addendum)
Inpatient Rehab Admissions Coordinator:   Consult received and chart reviewed.  Per Palliative documentation and therapy documentation, pt does appear to be improving with his mobility (per RN walked in his room today!), though still demonstrates some significant cognitive deficits.  I spoke to Mr. Kellie Simmering, pt's HCPOA, to determine if there was any possibility for supervision at home after a CIR stay.  He states that there really is no option for that level of support for the patient at home that he is aware of.  We discussed possibility of private duty caregivers and he said he does not feel like that would be an option, though it's something he said he could look into.  We discussed the challenges with insurance when it comes to SNF following CIR and that if Mr. Deupree were to admit to CIR, his only option would be to discharge home after 2-3 weeks.  Mr. Kellie Simmering agrees that, without 24/7 supervision available, it would be too risky to pursue CIR given that Mr. Vegh will likely need a prolonged rehabilitation, and potentially, long term care after his rehab.  I let Mr. Kellie Simmering know that if he did find out that 24/7 was a possibility to please let us know and we could investigate CIR further, but at this time I do not feel it's in the patient's best interest long term to pursue CIR.  I will sign off at this time.   Shann Medal, PT, DPT Admissions Coordinator [phone] 01/30/21  1:10 PM

## 2021-01-30 NOTE — Progress Notes (Addendum)
Occupational Therapy Treatment Patient Details Name: [name] MRN: [id number] DOB: 1950-12-17 Today's Date: 01/30/2021    History of present illness Pt is 70 yo admitted 4/14 with chest pain and STEMI with bradycardia and hypotension. Pt taken for urgent cath with temporary pacer placed but pt decompensated to cardiac arrest with intubation and CPR. Stent placed and pt with further complication of cardiogenic shock with Impella placed. 4/18 seizure like activity, Impella removed 4/20, 4/20 MRI with scattered micro embolic infarctions. Extubated 4/24 and reintubated 2 hrs later. Extubated 4/26. Reintubated 01/20/21. Extubated 01/23/21.  PMHx: HTN, HLD, CAD   OT comments  Pt is making significant gains. Completed seated grooming with min assist and drank from cup with supervision, cues needed to use R (dominant) hand as lead. Transferred to [facility] with min assist and ambulated with RW around bed with +2 min assist. Pt continues to demonstrate impaired cognition, stating he thought he would be able to pickup his dog Toby today. Pt also demonstrating increased processing speed and requires multimodal cues for following commands at times. Updated d/c recommendation to CIR.  Follow Up Recommendations  CIR    Equipment Recommendations  3 in 1 bedside commode    Recommendations for Other Services      Precautions / Restrictions Precautions Precautions: Fall;Other (comment) Precaution Comments: cortrak       Mobility Bed Mobility Overal bed mobility: Needs Assistance Bed Mobility: Sit to Supine       Sit to supine: Min assist   General bed mobility comments: min assist for LEs back into bed    Transfers Overall transfer level: Needs assistance Equipment used: Rolling walker (2 wheeled) Transfers: Sit to/from Stand Sit to Stand: Min assist;+2 safety/equipment         General transfer comment: multimodal cues for hand placement, assist to rise    Balance Overall balance  assessment: Needs assistance   Sitting balance-Leahy Scale: Good       Standing balance-Leahy Scale: Poor Standing balance comment: reliant on B UE support and min assist                           ADL either performed or assessed with clinical judgement   ADL Overall ADL's : Needs assistance/impaired Eating/Feeding: Supervision/ safety;Sitting Eating/Feeding Details (indicate cue type and reason): drinks with L hand Grooming: Oral care;Sitting;Supervision/safety                   Toilet Transfer: Minimal assistance;Stand-pivot;RW;BSC;+2 for safety/equipment   Toileting- Clothing Manipulation and Hygiene: Total assistance;Sit to/from stand       Functional mobility during ADLs: Minimal assistance;+2 for physical assistance;Rolling walker;Cueing for sequencing;Cueing for safety       Vision       Perception     Praxis      Cognition Arousal/Alertness: Awake/alert Behavior During Therapy: WFL for tasks assessed/performed Overall Cognitive Status: Impaired/Different from baseline Area of Impairment: Orientation;Safety/judgement;Memory;Following commands;Problem solving;Attention                 Orientation Level: Time Current Attention Level: Sustained Memory: Decreased short-term memory Following Commands: Follows one step commands with increased time;Follows one step commands inconsistently Safety/Judgement: Decreased awareness of safety;Decreased awareness of deficits Awareness: Intellectual Problem Solving: Difficulty sequencing;Decreased initiation;Slow processing;Requires verbal cues;Requires tactile cues General Comments: multimodal cues at times for direction following        Exercises     Shoulder Instructions       General  Comments      Pertinent Vitals/ Pain       Pain Assessment: Faces Faces Pain Scale: Hurts even more Pain Location: buttocks with pericare Pain Descriptors / Indicators: Grimacing;Guarding;Sore Pain  Intervention(s): Repositioned  Home Living                                          Prior Functioning/Environment              Frequency  Min 2X/week        Progress Toward Goals  OT Goals(current goals can now be found in the care plan section)  Progress towards OT goals: Progressing toward goals  Acute Rehab OT Goals Patient Stated Goal: to get better OT Goal Formulation: With patient Time For Goal Achievement: 02/13/21 Potential to Achieve Goals: Good  Plan Discharge plan needs to be updated    Co-evaluation                 AM-PAC OT "6 Clicks" Daily Activity     Outcome Measure   Help from another person eating meals?: A Little Help from another person taking care of personal grooming?: A Little Help from another person toileting, which includes using toliet, bedpan, or urinal?: Total Help from another person bathing (including washing, rinsing, drying)?: A Lot Help from another person to put on and taking off regular upper body clothing?: A Little Help from another person to put on and taking off regular lower body clothing?: Total 6 Click Score: 13    End of Session Equipment Utilized During Treatment: Gait belt;Rolling walker  OT Visit Diagnosis: Muscle weakness (generalized) (M62.81);Other abnormalities of gait and mobility (R26.89);Adult, failure to thrive (R62.7);Other symptoms and signs involving cognitive function Symptoms and signs involving cognitive functions: Cerebral infarction Hemiplegia - Right/Left: Right Hemiplegia - dominant/non-dominant: Dominant Hemiplegia - caused by: Cerebral infarction   Activity Tolerance Patient tolerated treatment well   Patient Left in bed;with call bell/phone within reach;with bed alarm set;with nursing/sitter in room   Nurse Communication Mobility status        Time: 5035-4656 OT Time Calculation (min): 32 min  Charges: OT General Charges $OT Visit: 1 Visit OT  Treatments $Self Care/Home Management : 23-37 mins  Nestor Lewandowsky, OTR/L Acute Rehabilitation Services Pager: [phone] Office: [phone]   [name] 01/30/2021, 11:47 AM

## 2021-01-30 NOTE — Progress Notes (Signed)
Patient is experiencing frequent loose stools incontinence with moisture MASD. Holding Stool softeners. Yesterday and over night. Patient is currently on [facility] with assiistance

## 2021-01-30 NOTE — Progress Notes (Signed)
Palliative:  HPI: 70yo male with PMH for HTN, CAD, arthritis, cataracts, GERD, leg neuropathy admitted 01/03/21 with cardiogenic shock. Hospitalization complicated by VF arrest requiring CPR on multiple occasions. Slow progress with concern for anoxic brain injury but he is now becoming more awake and responsive and interactive. He also required HD briefly and continues to work with SLP on diet.  I met today at Mr. Jiles bedside but he is very tired. He has already been up in chair. RN reports that he was able to walk around bed to get to recliner with one assist. Then he worked with OT and has just returned to bed. He continues with pureed diet and intake is not adequate but he does not like food consistency. I am very hopeful that his diet can be upgraded when SLP follows up and hopeful this will assist with improved intake. Also noted that he is having frequent loose stools but this is likely side effect from tube feeding and re-introduction of solid foods to his system. Hopefully we can stop tube feeding in the near future.   Mr. Dillenbeck is very fatigued today so I left him to get some rest. His goals remain unchanged with hopes for continued improvement.   Exam: Alert, sleepy. Improving cognition although still with some confusion intermittently.   Plan: - Continues to improve. Hopeful for further improvement and gains in functional status with rehab support.   Koppel, NP Palliative Medicine Team Pager [phone] (Please see [email or web address] for schedule) Team Phone [phone]    Greater than 50%  of this time was spent counseling and coordinating care related to the above assessment and plan

## 2021-01-30 NOTE — Progress Notes (Signed)
  Speech Language Pathology Treatment: Dysphagia  Patient Details Name: [name] MRN: [id number] DOB: 04-20-1951 Today's Date: 01/30/2021 Time: 6720-9470 SLP Time Calculation (min) (ACUTE ONLY): 12 min  Assessment / Plan / Recommendation Clinical Impression  Pt has improving mentation, and verbalizes his distaste for pureed foods. SLP also discussed its impact on nutrition with RN and dietitian. He self-fed regular solids and thin liquids via straw with no overt signs of aspiration or dysphagia, even when he was taking larger consecutive sips. Given his current cognitive status he would still benefit from assistance during meals, but will advance his diet to regular solids, keeping thin liquids. Will f/u briefly for tolerance, but anticipate that he will need more prolonged f/u for cognitive and communicative goals (see full eval in separate note).   HPI HPI: Pt is 70 y.o. with PMH of  HTN, HLD, CAD, admitted with chest pain and STEMI with bradycardia and hypotension. Pt taken for urgent cath with temporary pacer placed but pt decompensated to cardiac arrest with intubation and CPR. Stent placed and pt with further complication of cardiogenic shock with Impella placed. 4/18 seizure like activity, Impella removed 4/20, 4/20 MRI with scattered micro embolic infarctions. ETT 4/14-4/24 and reintubated 2 hrs later. Extubated 4/26. EEG (4/23) revealed moderate diffuse encephalopathy, nonspecific etiology. Chest xray (4/24) revealed:"Bibasilar opacities are mildly worsened in the interval worrisome for pneumonia". Failed Yale Swallow Screen 2/26. Patient with subsequent reintubation on 5/2 with interval extubation by at least 5/6.  Most recent chest xray dated 05/06 was showing no acdtive cardiopulmonary disease.      SLP Plan  Continue with current plan of care       Recommendations  Diet recommendations: Regular;Thin liquid Liquids provided via: Cup;Straw Medication Administration: Whole  meds with puree Supervision: Staff to assist with self feeding;Full supervision/cueing for compensatory strategies Compensations: Slow rate;Small sips/bites;Follow solids with liquid Postural Changes and/or Swallow Maneuvers: Seated upright 90 degrees                Oral Care Recommendations: Oral care BID Follow up Recommendations: Inpatient Rehab (if able to get 24/7 support; otherwise, SNF) SLP Visit Diagnosis: Dysphagia, oropharyngeal phase (R13.12) Plan: Continue with current plan of care       GO                Osie Bond., M.A. [place] Acute Rehabilitation Services Pager [phone] Office [phone]  01/30/2021, 4:26 PM

## 2021-01-30 NOTE — TOC Progression Note (Addendum)
Transition of Care (TOC) - Progression Note  Heart Failure   Patient Details  Name: [name] MRN: [id number] Date of Birth: October 14, 1950  Transition of Care [facility]) CM/SW [place], [place] Phone Number: 01/30/2021, 1:54 PM  Clinical Narrative:    CSW following patient for discharge needs and reached out to registered dietician to keep updated about patients cor-trak as this is a barrier for SNF placement. CIR denied due to lack of support system. SNF placement pending based on cor-trak.  CSW will continue to follow throughout discharge.    Expected Discharge Plan: [place] Barriers to Discharge: Continued Medical Work up  Expected Discharge Plan and Services Expected Discharge Plan: [place] In-house Referral: Clinical Social Work Discharge Planning Services: CM Consult Post Acute Care Choice: [place] arrangements for the past 2 months: Single Family Home                                       Social Determinants of Health (SDOH) Interventions    Readmission Risk Interventions No flowsheet data found.  Mariann Palo, MSW, [place] Heart Failure Social Worker

## 2021-01-30 NOTE — Progress Notes (Signed)
Calorie Count Note  48 hour calorie count ordered.  Diet: dysphagia 1 diet with thin liquids Supplements: Magic cup BID with meals, each supplement provides 290 kcal and 9 grams of protein; Ensure Enlive po BID, each supplement provides 350 kcal and 20 grams of protein  Pt resting quietly at time of visit. Minimal meal completion data to assess at this time.   Pt currently only meeting about 50% of needs via PO intake. Per RN, pt does not like texture of pureed food. Will continue calorie count for one more day to assess PO intake and ability to remove cortrak.   5/9 Breakfast: 461 kcals, 18 grams protein Lunch: 581 kcals, 29 grams protein Dinner: 275 kcals, 14 grams protein  Total intake: 1317 kcal (63% of minimum estimated needs)  61 grams protein (55% of minimum estimated needs)  5/10 Breakfast: 421 kcals, 13 grams protein Lunch: nothing documented Dinner: nothing documented Supplements: 1 Ensure Enlive (350 kcals, 20 grams protein)  Total intake: 771 kcal (37% of minimum estimated needs)  33 grams protein (30% of minimum estimated needs)  Average Total intake: 1044 kcal (50% of minimum estimated needs)  47 grams protein (43% of minimum estimated needs)  Nutrition Dx: Inadequate oral intakerelated to acute illnessas evidenced by NPO status; progressing  Goal: Patient will meet greater than or equal to 90% of their needs; progressing   Intervention:   -Continue calorie count -Continue Magic cupBID with meals, each supplement provides 290 kcal and 9 grams of protein -Continue Ensure Enlive po BID, each supplement provides 350 kcal and 20 grams of protein   -Continue nocturnal feedings -Osmolite 1.5@ 31ml/hr via cortrak over 12 hour period  8ml Prosource TF BID.  300 ml free water flush every 2 hours  Tube feeding regimen provides1120kcal (53% of needs),56grams of protein, and 575ml of H2O. Total free water: 4147 ml daily  -Continue 1  packet Nutrisource fiber BID via tube  Loistine Chance, RD, LDN, [place] Registered Dietitian II Certified Diabetes Care and Education Specialist Please refer to [facility] for RD and/or RD on-call/weekend/after hours pager

## 2021-01-30 NOTE — Progress Notes (Signed)
Patient has been making progress. Tolerated 1st solid meal with no problem.   calling out to use the bathroom with frequent loose stools. Condom cath is off to rest penial area. Recommend using at night until bowel and bladder goals are met?

## 2021-01-30 NOTE — Plan of Care (Signed)
  Problem: Education: Goal: Knowledge of General Education information will improve Description Including pain rating scale, medication(s)/side effects and non-pharmacologic comfort measures Outcome: Progressing   Problem: Health Behavior/Discharge Planning: Goal: Ability to manage health-related needs will improve Outcome: Progressing   

## 2021-01-30 NOTE — Evaluation (Signed)
Speech Language Pathology Evaluation Patient Details Name: [name] MRN: [id number] DOB: 30-Mar-1951 Today's Date: 01/30/2021 Time: 1550-1606 SLP Time Calculation (min) (ACUTE ONLY): 16 min  Problem List:  Patient Active Problem List   Diagnosis Date Noted  . Hypertension   . Hemoptysis   . Acute hypoxemic respiratory failure ([place])   . Acute renal failure ([place])   . Encephalopathy acute   . Cardiogenic shock ([place]) 01/03/2021  . Atypical chest pain 10/25/2019  . Hyperglycemia 10/12/2019  . Coronary artery calcification seen on CAT scan 06/15/2019  . Nodule of lower lobe of left lung 06/15/2019  . Tremor of right hand 04/24/2017  . Numbness and tingling of both legs 04/24/2017  . Bilateral leg edema 06/05/2015  . Chronic neck pain 06/05/2015  . Plantar fasciitis 02/01/2014  . Visual floaters 04/04/2013  . Subjective vision disturbance, left 04/04/2013  . 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 MELANOMA, HX OF 06/01/2009  . NECK PAIN 09/11/2008  . CELLULITIS AND ABSCESS OF LEG EXCEPT FOOT 04/26/2008  . ERECTILE DYSFUNCTION 10/12/2007  . Hyperlipemia, mixed 04/12/2007  . Anxiety state 04/12/2007  . Essential hypertension 04/12/2007  . GERD 04/12/2007   Past Medical History:  Past Medical History:  Diagnosis Date  . Anxiety   . Arthritis   . Cancer ([place]) 2010   basal cell ca, head  . Cataract    bilateral  . Depression   . ED (erectile dysfunction)   . GERD (gastroesophageal reflux disease)   . Hyperlipidemia   . Hypertension   . Kidney stone 07-29-11   passed   . 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 Vitreous floater    left eye, sees Dr. Dawna [facility]    Past Surgical History:  Past Surgical History:  Procedure Laterality Date  . BASAL CELL CARCINOMA EXCISION     removed from scalp  . COLONOSCOPY  07/03/2017   per Dr. Havery Moros, sessile serrated polyp, repeat in 5 yrs   . HERNIA REPAIR    . LEFT HEART CATH AND  CORONARY ANGIOGRAPHY N/A 01/03/2021   Procedure: LEFT HEART CATH AND CORONARY ANGIOGRAPHY;  Surgeon: Troy Sine, MD;  Location: [place] [facility];  Service: Cardiovascular;  Laterality: N/A;  . RIGHT HEART CATH N/A 01/03/2021   Procedure: RIGHT HEART CATH;  Surgeon: Jolaine Artist, MD;  Location: [place] [facility];  Service: Cardiovascular;  Laterality: N/A;   HPI:  Pt is 70 y.o. with PMH of  HTN, HLD, CAD, admitted with chest pain and STEMI with bradycardia and hypotension. Pt taken for urgent cath with temporary pacer placed but pt decompensated to cardiac arrest with intubation and CPR. Stent placed and pt with further complication of cardiogenic shock with Impella placed. 4/18 seizure like activity, Impella removed 4/20, 4/20 MRI with scattered micro embolic infarctions. ETT 4/14-4/24 and reintubated 2 hrs later. Extubated 4/26. EEG (4/23) revealed moderate diffuse encephalopathy, nonspecific etiology. Chest xray (4/24) revealed:"Bibasilar opacities are mildly worsened in the interval worrisome for pneumonia". Failed [facility] Swallow Screen 2/26. Patient with subsequent reintubation on 5/2 with interval extubation by at least 5/6.  Most recent chest xray dated 05/06 was showing no acdtive cardiopulmonary disease.   Assessment / Plan / Recommendation Clinical Impression  Pt shows great improvements compared to evaluation earlier this admission, although with persistent deficits noted in cognition and communication. His verbal expression is much more fluent, although at times confabulatory. When asked direct questions to which he does not  know the answer, he will give responses that are void of much content. He was 71% accurate with mildly complex yes/no questions and followed most commands well. His impaired sustained attention impacts other areas of cognition, including his problem solving and his storage of new information. He would benefit from ongoing SLP f/u acutely and at next level of  care.    SLP Assessment  SLP Recommendation/Assessment: Patient needs continued Speech Lanaguage Pathology Services SLP Visit Diagnosis: Cognitive communication deficit (R41.841)    Follow Up Recommendations  Inpatient Rehab (if able to get 24/7 support; otherwise, SNF)    Frequency and Duration min 2x/week  2 weeks      SLP Evaluation Cognition  Overall Cognitive Status: Impaired/Different from baseline Arousal/Alertness: Awake/alert Orientation Level: Oriented to person;Oriented to place;Oriented to time;Disoriented to situation (only partially oriented to situation) Attention: Sustained Sustained Attention: Impaired Sustained Attention Impairment: Verbal basic;Functional basic Memory: Impaired Memory Impairment: Storage deficit Awareness: Impaired Awareness Impairment: Intellectual impairment;Emergent impairment Problem Solving: Impaired Problem Solving Impairment: Verbal basic Behaviors: Confabulation Safety/Judgment: Impaired       Comprehension  Auditory Comprehension Overall Auditory Comprehension: Impaired Yes/No Questions: Impaired Complex Questions: 50-74% accurate Commands: Impaired One Step Basic Commands: 75-100% accurate Interfering Components: Attention    Expression Expression Primary Mode of Expression: Verbal Verbal Expression Overall Verbal Expression: Impaired Initiation: No impairment Automatic Speech: Name;Social Response Level of Generative/Spontaneous Verbalization: Sentence Naming: Impairment Divergent:  (only 4 words produced in one minute) Non-Verbal Means of Communication: Not applicable   Oral / Motor  Motor Speech Overall Motor Speech: Appears within functional limits for tasks assessed   GO                     Osie Bond., M.A. [place] Acute Rehabilitation Services Pager [phone] Office [phone]  01/30/2021, 4:40 PM

## 2021-01-30 NOTE — Plan of Care (Signed)
  Problem: Education: Goal: Knowledge of General Education information will improve Description: Including pain rating scale, medication(s)/side effects and non-pharmacologic comfort measures Outcome: Progressing   Problem: Health Behavior/Discharge Planning: Goal: Ability to manage health-related needs will improve Outcome: Progressing   Problem: Clinical Measurements: Goal: Ability to maintain clinical measurements within normal limits will improve Outcome: Progressing Goal: Will remain free from infection Outcome: Progressing Goal: Diagnostic test results will improve Outcome: Progressing Goal: Respiratory complications will improve Outcome: Progressing Goal: Cardiovascular complication will be avoided Outcome: Progressing   Problem: Activity: Goal: Risk for activity intolerance will decrease Outcome: Progressing   Problem: Nutrition: Goal: Adequate nutrition will be maintained Outcome: Progressing   Problem: Coping: Goal: Level of anxiety will decrease Outcome: Progressing   Problem: Elimination: Goal: Will not experience complications related to bowel motility Outcome: Progressing   Problem: Safety: Goal: Ability to remain free from injury will improve Outcome: Progressing   Problem: Skin Integrity: Goal: Risk for impaired skin integrity will decrease Outcome: Progressing   Problem: Education: Goal: Understanding of cardiac disease, CV risk reduction, and recovery process will improve Outcome: Progressing Goal: Understanding of medication regimen will improve Outcome: Progressing Goal: Individualized Educational Video(s) Outcome: Progressing   Problem: Activity: Goal: Ability to tolerate increased activity will improve Outcome: Progressing   Problem: Cardiac: Goal: Ability to achieve and maintain adequate cardiopulmonary perfusion will improve Outcome: Progressing   Problem: Activity: Goal: Ability to tolerate increased activity will  improve Outcome: Progressing   Problem: Respiratory: Goal: Ability to maintain a clear airway and adequate ventilation will improve Outcome: Progressing   Problem: Education: Goal: Ability to demonstrate management of disease process will improve Outcome: Progressing Goal: Ability to verbalize understanding of medication therapies will improve Outcome: Progressing   Problem: Activity: Goal: Capacity to carry out activities will improve Outcome: Progressing   Problem: Cardiac: Goal: Ability to achieve and maintain adequate cardiopulmonary perfusion will improve Outcome: Progressing

## 2021-01-31 DIAGNOSIS — R630 Anorexia: Secondary | ICD-10-CM

## 2021-01-31 DIAGNOSIS — G9340 Encephalopathy, unspecified: Secondary | ICD-10-CM | POA: Diagnosis not present

## 2021-01-31 DIAGNOSIS — R570 Cardiogenic shock: Secondary | ICD-10-CM | POA: Diagnosis not present

## 2021-01-31 DIAGNOSIS — Z7189 Other specified counseling: Secondary | ICD-10-CM | POA: Diagnosis not present

## 2021-01-31 DIAGNOSIS — J9601 Acute respiratory failure with hypoxia: Secondary | ICD-10-CM | POA: Diagnosis not present

## 2021-01-31 DIAGNOSIS — N170 Acute kidney failure with tubular necrosis: Secondary | ICD-10-CM | POA: Diagnosis not present

## 2021-01-31 DIAGNOSIS — Z515 Encounter for palliative care: Secondary | ICD-10-CM | POA: Diagnosis not present

## 2021-01-31 LAB — CBC
HCT: 23 % — ABNORMAL LOW (ref 39.0–52.0)
Hemoglobin: 7.4 g/dL — ABNORMAL LOW (ref 13.0–17.0)
MCH: 31 pg (ref 26.0–34.0)
MCHC: 32.2 g/dL (ref 30.0–36.0)
MCV: 96.2 fL (ref 80.0–100.0)
Platelets: 212 10*3/uL (ref 150–400)
RBC: 2.39 MIL/uL — ABNORMAL LOW (ref 4.22–5.81)
RDW: 17.1 % — ABNORMAL HIGH (ref 11.5–15.5)
WBC: 14.6 10*3/uL — ABNORMAL HIGH (ref 4.0–10.5)
nRBC: 0 % (ref 0.0–0.2)

## 2021-01-31 LAB — COMPREHENSIVE METABOLIC PANEL
ALT: 83 U/L — ABNORMAL HIGH (ref 0–44)
AST: 26 U/L (ref 15–41)
Albumin: 2.2 g/dL — ABNORMAL LOW (ref 3.5–5.0)
Alkaline Phosphatase: 81 U/L (ref 38–126)
Anion gap: 9 (ref 5–15)
BUN: 57 mg/dL — ABNORMAL HIGH (ref 8–23)
CO2: 18 mmol/L — ABNORMAL LOW (ref 22–32)
Calcium: 8.1 mg/dL — ABNORMAL LOW (ref 8.9–10.3)
Chloride: 109 mmol/L (ref 98–111)
Creatinine, Ser: 1.96 mg/dL — ABNORMAL HIGH (ref 0.61–1.24)
GFR, Estimated: 36 mL/min — ABNORMAL LOW (ref >60)
Glucose, Bld: 110 mg/dL — ABNORMAL HIGH (ref 70–99)
Potassium: 3.6 mmol/L (ref 3.5–5.1)
Sodium: 136 mmol/L (ref 135–145)
Total Bilirubin: 0.4 mg/dL (ref 0.3–1.2)
Total Protein: 5.7 g/dL — ABNORMAL LOW (ref 6.5–8.1)

## 2021-01-31 LAB — MAGNESIUM: Magnesium: 1.8 mg/dL (ref 1.7–2.4)

## 2021-01-31 LAB — GLUCOSE, CAPILLARY
Glucose-Capillary: 113 mg/dL — ABNORMAL HIGH (ref 70–99)
Glucose-Capillary: 94 mg/dL (ref 70–99)
Glucose-Capillary: 98 mg/dL (ref 70–99)
Glucose-Capillary: 95 mg/dL (ref 70–99)

## 2021-01-31 MED ORDER — BOOST / RESOURCE BREEZE PO LIQD CUSTOM
1.0000 | Freq: Three times a day (TID) | ORAL | Status: DC
  Administered 2021-01-31 – 2021-02-06 (×17): 1 via ORAL

## 2021-01-31 MED ORDER — POTASSIUM CHLORIDE CRYS ER 20 MEQ PO TBCR
40.0000 meq | EXTENDED_RELEASE_TABLET | Freq: Once | ORAL | Status: AC
  Administered 2021-01-31: 40 meq via ORAL
  Filled 2021-01-31: qty 2

## 2021-01-31 NOTE — Progress Notes (Signed)
Patient ID: [name], male   DOB: June 25, 1951, 70 y.o.   MRN: [id number]     Advanced Heart Failure Rounding Note  PCP-Cardiologist: None   Subjective:    Having breakfast, doesn't like the food.  Denies dyspnea or chest pain.  Weight down 1lb.  BUN improved cr exactly the same at 1.96.     Objective:   Weight Range: 82.6 kg Body mass index is 24.01 kg/m.   Vital Signs:   Temp:  [97.6 F (36.4 C)-98.1 F (36.7 C)] 97.6 F (36.4 C) (05/12 0752) Pulse Rate:  [79-88] 84 (05/12 0752) Resp:  [16-20] 20 (05/12 0341) BP: (117-140)/(62-76) 126/69 (05/12 0341) SpO2:  [98 %-100 %] 99 % (05/12 0752) Weight:  [82.6 kg] 82.6 kg (05/12 0341) Last BM Date: 01/30/21  Weight change: Filed Weights   01/29/21 0500 01/30/21 0332 01/31/21 0341  Weight: 82.4 kg 83.6 kg 82.6 kg    Intake/Output:   Intake/Output Summary (Last 24 hours) at 01/31/2021 0801 Last data filed at 01/31/2021 0540 Gross per 24 hour  Intake 2293.06 ml  Output 1050 ml  Net 1243.06 ml      Physical Exam   General:  Sitting in chair No resp difficulty Neck: supple. JVD 7. Carotids 2+ bilat; no bruits. No lymphadenopathy or thryomegaly appreciated. Cor: PMI nondisplaced. Regular rate & rhythm. No rubs, gallops or murmurs. Lungs: faint rales right base otherwise clear, no increased wob Abdomen: soft, nontender, nondistended. No hepatosplenomegaly. No bruits or masses. Good bowel sounds. Extremities: no cyanosis, clubbing, rash, no edema Neuro: alert, cranial nerves grossly intact. moves all 4 extremities w/o difficulty. Affect pleasant   Telemetry   SR 80s Personally reviewed  Labs    CBC Recent Labs    01/30/21 0303 01/31/21 0316  WBC 16.4* 14.6*  HGB 8.2* 7.4*  HCT 25.9* 23.0*  MCV 94.9 96.2  PLT 231 474   Basic Metabolic Panel Recent Labs    01/29/21 0334 01/30/21 0303 01/31/21 0316  NA 140 139 136  K 3.6 3.8 3.6  CL 108 109 109  CO2 21* 20* 18*  GLUCOSE 126* 119* 110*  BUN 76*  64* 57*  CREATININE 2.13* 1.96* 1.96*  CALCIUM 8.2* 8.3* 8.1*  MG 2.0 1.9 1.8  PHOS 4.6 3.8  --    Liver Function Tests Recent Labs    01/29/21 0334 01/31/21 0316  AST  --  26  ALT  --  83*  ALKPHOS  --  81  BILITOT  --  0.4  PROT  --  5.7*  ALBUMIN 2.3* 2.2*   No results for input(s): LIPASE, AMYLASE in the last 72 hours. Cardiac Enzymes No results for input(s): CKTOTAL, CKMB, CKMBINDEX, TROPONINI in the last 72 hours.  BNP: BNP (last 3 results) No results for input(s): BNP in the last 8760 hours.  ProBNP (last 3 results) No results for input(s): PROBNP in the last 8760 hours.   D-Dimer No results for input(s): DDIMER in the last 72 hours. Hemoglobin A1C No results for input(s): HGBA1C in the last 72 hours. Fasting Lipid Panel No results for input(s): CHOL, HDL, LDLCALC, TRIG, CHOLHDL, LDLDIRECT in the last 72 hours. Thyroid Function Tests No results for input(s): TSH, T4TOTAL, T3FREE, THYROIDAB in the last 72 hours.  Invalid input(s): FREET3  Other results:   Imaging    No results found.   Medications:     Scheduled Medications: . aspirin  81 mg Per Tube Daily  . chlorhexidine gluconate (MEDLINE KIT)  15 mL  Mouth Rinse BID  . Chlorhexidine Gluconate Cloth  6 each Topical Daily  . enoxaparin (LOVENOX) injection  40 mg Subcutaneous Q24H  . feeding supplement  237 mL Oral BID BM  . feeding supplement (PROSource TF)  90 mL Per Tube BID  . fiber  1 packet Oral BID  . hydrALAZINE  25 mg Per Tube Q8H  . insulin aspart  0-9 Units Subcutaneous TID AC & HS  . isosorbide dinitrate  10 mg Per Tube TID  . mouth rinse  15 mL Mouth Rinse q12n4p  . modafinil  100 mg Per Tube Daily  . pantoprazole sodium  40 mg Per Tube Daily  . rosuvastatin  10 mg Per Tube Daily  . spironolactone  12.5 mg Oral Daily  . ticagrelor  90 mg Per Tube BID    Infusions: . cefTAZidime (FORTAZ)  IV 2 g (01/30/21 2224)  . feeding supplement (OSMOLITE 1.5 CAL) 720 mL (01/30/21 2007)     PRN Medications: acetaminophen (TYLENOL) oral liquid 160 mg/5 mL, atropine, ipratropium-albuterol, ondansetron (ZOFRAN) IV, sodium chloride flush, Zinc Oxide    Assessment/Plan   1. CAD/ Acute Inferior STEMI - emergent cath w/ pRCA occlusion s/p PCI + DES - Continue DAPT/statin - no s/s angina  2. VF Arrest - in setting of acute MI, now s/p revascularization  - No further VF. Rhythm stable   3. Acute Systolic Heart Failure>>Cardiogenic Shock  - ICM Echo LVEF 20-25%, RV moderately reduced - Impella out 4/20. - CVVH stopped 4/24.  - Starting to retain a small amount of volume - As renal function continues to improve can expand GDMT.  - Continue  Hydral and nitrates - Continue spiro 12.5 - may be able to add low dose losartan today   4. Acute hypoxic respiratory failure - 4/24 failed extubation trial.  - 5/1 reintubated due to inability to handle secretions/hemoptysis - 5/4 extubated   5. Nonoliguric AKI - due to ATN/shock - CVVH begun 4/22 and stopped 4/24. Had one session of iHD on 4/30 for clearance - Renal function now improving   6. Anoxic brain injury  - EEG w/ generalized periodic discharges with triphasic morphology + moderate to severe diffuse encephalopathy.  - MRI- numerous micro embolic infarctions . No hemorrhage or mass.  - CIR has turned down due to poor rehab potential. Recommending SNF - Dramatic improvement and re-evaluated by CIR however turned down again due to his need for long term care which would be difficult to achieve after CIR.    Length of Stay: 28  Katherine Roan, MD  8:01 AM   Patient seen and examined with the above-signed Advanced Practice Provider and/or Housestaff. I personally reviewed laboratory data, imaging studies and relevant notes. I independently examined the patient and formulated the important aspects of the plan. I have edited the note to reflect any of my changes or salient points. I have personally discussed the plan  with the patient and/or family.  Overall stable. Volume status looks ok. HGb drifting down. Turned down for CIR  General:  Weak appearing. No resp difficulty HEENT: normal + cor-trak Neck: supple. no JVD. Carotids 2+ bilat; no bruits. No lymphadenopathy or thryomegaly appreciated. Cor: PMI nondisplaced. Regular rate & rhythm. No rubs, gallops or murmurs. Lungs: clear Abdomen: soft, nontender, nondistended. No hepatosplenomegaly. No bruits or masses. Good bowel sounds. Extremities: no cyanosis, clubbing, rash, tr edema Neuro: alert & orientedx3, cranial nerves grossly intact. moves all 4 extremities w/o difficulty. Affect pleasant  Would continue  current regimen for now. Can consider losartan soon as above. Needs SNF placement.   Glori Bickers, MD  9:09 AM

## 2021-01-31 NOTE — Progress Notes (Signed)
Physical Therapy Treatment Patient Details Name: [name] MRN: [id number] DOB: August 23, 1951 Today's Date: 01/31/2021    History of Present Illness Pt is 70 yo admitted 4/14 with chest pain and STEMI with bradycardia and hypotension. Pt taken for urgent cath with temporary pacer placed but pt decompensated to cardiac arrest with intubation and CPR. Stent placed and pt with further complication of cardiogenic shock with Impella placed. 4/18 seizure like activity, Impella removed 4/20, 4/20 MRI with scattered micro embolic infarctions. Extubated 4/24 and reintubated 2 hrs later. Extubated 4/26. Reintubated 01/20/21. Extubated 01/23/21.  PMHx: HTN, HLD, CAD    PT Comments    Pt demonstrates progress during today's session compared to last session in bed mobility, transfers, and gait, requiring no physical assistance. Pt is easily distracted during session, impacting mobility quality. Pt performs sit>stand transfers, requiring multiple verbal cues for hand placement. Pt tolerates ambulating household distances with RW and no physical assistance, limited by onset of fatigue and requests seated rest break. Pt will continue to benefit from acute PT to address deficits in strength, endurance, power, and activity tolerance for safe and independent mobility. SPT currently recommends SNF placement as CIR has signed off due to limited caregiver support.   Follow Up Recommendations  SNF (CIR signed off due to limited caregiver support.)     Equipment Recommendations  Rolling walker with 5" wheels    Recommendations for Other Services       Precautions / Restrictions Precautions Precautions: Fall;Other (comment) Precaution Comments: cortrak, pt continues to demonstrate expressive and receptive aphasia. Restrictions Weight Bearing Restrictions: No    Mobility  Bed Mobility Overal bed mobility: Needs Assistance Bed Mobility: Sit to Supine       Sit to supine: Min guard;HOB elevated         Transfers Overall transfer level: Needs assistance Equipment used: Rolling walker (2 wheeled) Transfers: Sit to/from Stand Sit to Stand: Min guard         General transfer comment: pt requires multiple VC for hand placement  Ambulation/Gait Ambulation/Gait assistance: Min guard Gait Distance (Feet): 175 Feet Assistive device: Rolling walker (2 wheeled) Gait Pattern/deviations: Step-through pattern;Decreased stride length;Drifts right/left Gait velocity: decreased Gait velocity interpretation: 1.31 - 2.62 ft/sec, indicative of limited community ambulator General Gait Details: Pt requires multiple VC to bring attention to obstacles he is immediately about to collide with, with RW. Pt requires one seated rest break during gait due to reports of fatigue.   Stairs             Wheelchair Mobility    Modified Rankin (Stroke Patients Only)       Balance Overall balance assessment: Needs assistance Sitting-balance support: Feet supported;No upper extremity supported Sitting balance-Leahy Scale: Good     Standing balance support: During functional activity;Single extremity supported;Bilateral upper extremity supported Standing balance-Leahy Scale: Poor Standing balance comment: Pt relies on at least single UE support, sits back down when asked to don face mask.                            Cognition Arousal/Alertness: Awake/alert Behavior During Therapy: WFL for tasks assessed/performed Overall Cognitive Status: Impaired/Different from baseline Area of Impairment: Safety/judgement;Memory;Problem solving;Following commands;Attention;Awareness                   Current Attention Level: Sustained Memory: Decreased short-term memory Following Commands: Follows one step commands consistently;Follows one step commands with increased time Safety/Judgement: Decreased awareness of safety;Decreased  awareness of deficits Awareness: Intellectual Problem  Solving: Requires verbal cues;Difficulty sequencing;Decreased initiation;Slow processing        Exercises      General Comments General comments (skin integrity, edema, etc.): VSS on RA.      Pertinent Vitals/Pain Pain Assessment: No/denies pain    Home Living                      Prior Function            PT Goals (current goals can now be found in the care plan section) Acute Rehab PT Goals Patient Stated Goal: To get better and go home. Progress towards PT goals: Progressing toward goals    Frequency    Min 3X/week      PT Plan Current plan remains appropriate    Co-evaluation              AM-PAC PT "6 Clicks" Mobility   Outcome Measure  Help needed turning from your back to your side while in a flat bed without using bedrails?: None Help needed moving from lying on your back to sitting on the side of a flat bed without using bedrails?: A Little Help needed moving to and from a bed to a chair (including a wheelchair)?: A Little Help needed standing up from a chair using your arms (e.g., wheelchair or bedside chair)?: A Little Help needed to walk in hospital room?: A Little Help needed climbing 3-5 steps with a railing? : A Lot 6 Click Score: 18    End of Session Equipment Utilized During Treatment: Gait belt Activity Tolerance: Patient limited by fatigue Patient left: in bed;with bed alarm set;with call bell/phone within reach Nurse Communication: Mobility status PT Visit Diagnosis: Other abnormalities of gait and mobility (R26.89);Muscle weakness (generalized) (M62.81);Difficulty in walking, not elsewhere classified (R26.2);Unsteadiness on feet (R26.81)     Time: 0321-2248 PT Time Calculation (min) (ACUTE ONLY): 35 min  Charges:  $Gait Training: 23-37 mins                     Acute Rehab  Pager: [phone]    Garwin Brothers, SPT  01/31/2021, 5:06 PM

## 2021-01-31 NOTE — Progress Notes (Addendum)
Calorie Count Note  48 hour calorie count ordered.  Diet: Heart Healthy Supplements: Magic cup BID with meals, each supplement provides 290 kcal and 9 grams of protein; Ensure Enlive po BID, each supplement provides 350 kcal and 20 grams of protein  Case discussed with RN. Pt only consumed about 10% of lunch and breakfast today. He is mostly picking at his food.   Case discussed with Dr. Darrick Meigs and Vinie Sill, NP with Palliative Care. Palliative to follow-up with pt and HCPOA regarding wishes for PEG placement.   Updated CSW regarding plan as well.   5/9 Breakfast:461 kcals, 18 grams protein Lunch:581 kcals, 29 grams protein Dinner:275 kcals, 14 grams protein  Total intake: 1317kcal (63% of minimum estimated needs) 61 gramsprotein (55% of minimum estimated needs)  5/10 Breakfast: 421 kcals, 13 grams protein Lunch: nothing documented Dinner: nothing documented Supplements: 1 Ensure Enlive (350 kcals, 20 grams protein)  Total intake: 771 kcal (37% of minimum estimated needs)  33 grams protein (30% of minimum estimated needs)  5/11 Breakfast: 410 kcals, 19 grams protein Lunch: 281 kcals, 23 grams protein Dinner: 480 kcals, 23 grams protein Supplements: refusing Ensure, does not like milky supplements  Total intake: 1171 kcal (56% of minimum estimated needs)  65 grams protein (60% of minimum estimated needs)  Average Total intake: 1086 kcal (52% of minimum estimated needs)  65 grams protein (53% of minimum estimated needs)  Nutrition Dx:Inadequate oral intakerelated to acute illnessas evidenced by NPO status; progressing  Goal:Patient will meet greater than or equal to 90% of their needs; unmet  Intervention:   -D/c calorie count -ContinueMagic cupBID with meals, each supplement provides 290 kcal and 9 grams of protein -ContinueEnsure Enlive po BID, each supplement provides 350 kcal and 20 grams of protein  -Continuenocturnal feedings -Osmolite  1.5@ 73ml/hr via cortrak over 12 hour period  27ml Prosource TF BID.  300 ml free water flush every 2 hours  Tube feeding regimen provides1120kcal (53% of needs),56grams of protein, and 519ml of H2O. Total free water: 4147 ml daily  -Continue 1 packet Nutrisource fiber BID via tube  -Due to consistent inadequate oral intake, recommend permanent feeding access (ex PEG) if this is within pt's goals of care  Loistine Chance, RD, LDN, [place] Registered Dietitian II Certified Diabetes Care and Education Specialist Please refer to [facility] for RD and/or RD on-call/weekend/after hours pager

## 2021-01-31 NOTE — Progress Notes (Signed)
[facility]  PROGRESS NOTE  [name] VEL:[id number] DOB: Mar 23, 1951 DOA: 01/03/2021 PCP: [name], MD   Brief HPI:   70 year old male with past medical history of hypertension, hyperlipidemia, CAD who was admitted with ST relation MI and cardiac arrest prior to coming to hospital.  He underwent emergent cardiac catheterization and PCI with stent to proximal RCA.  Ejection fraction was noted to be 25 to 30%.  Patient did have prolonged ICU stay for cardiogenic/hemorrhagic shock requiring vasopressors and transfusion.  He suffered from diminished mental status and MRI showed scattered punctate infarction concerning for anoxic injury.  Also had acute kidney injury and required CRRT and was subsequently transitioned to intermittent hemodialysis.  Patient was extubated on 01/15/2021 and reintubated on 01/20/2021.  Subsequently was extubated on 01/23/2021 and was transferred out of ICU.  Patient's mental status has significantly improved.  Palliative care was consulted and healthcare power of attorney decided not to escalate care and made him DNR.  Nephrology and cardiology have been following the patient.  Significant Events: 4/20 Impella(heart pump) removed. MRI brain with numerous micro embolic infarctions. Off pressors.  4/21 lasix drip stopped.  4/22 started CVVH 4/24 CVVH stopped. Failed extubation. Re-intubated.  4/26 Extubated  5/1 Re-intubated 5/4 Extubated, off pressors    Subjective   Patient seen and examined, asking when we can remove the feeding tube.  Has poor p.o. intake.   Assessment/Plan:     1. CAD/acute inferior STEMI-patient underwent emergent cardiac catheterization with PCI and drug-eluting stent to right coronary artery.  Continue DAPT, statin.  Patient not ARB due to acute kidney injury.  Continue hydralazine and nitrates. 2. Acute systolic heart failure/cardiogenic shock-patient's EF was found to be 25 to 30%, he underwent CRRT.  Currently hemodialysis  has been stopped.  Patient slowly improving.  Continue hydralazine, isosorbide, spironolactone. 3. S/p ventricular fibrillation arrest-s/p anoxic brain injury.  Cognition has improved.  Patient current status is DNR. 4. Fever with leukocytosis-likely secondary to pseudomonal UTI.  Hemodialysis catheter was removed including Foley catheter.  UA showed some pyuria.  Urine culture grew Pseudomonas.  Patient was initially started on Rocephin which was changed to ceftazidime.  Hemodialysis catheter tip culture showed 3000 colonies of staph epidermidis.  Likely contamination.  No need to treat as patient is improving.  Blood cultures have been negative in 4 days.  Chest x-ray showed minimal bibasilar atelectasis.  Fever has resolved.  WBC is down to 14,000.  5. Nonoliguric AKI-likely from ATN and cardiogenic shock.  Creatinine has improved to 1.96.  No further hemodialysis plan per nephrology.  HD catheter has been removed. 6. Anoxic brain injury-MRI brain showed microembolic infarcts and EEG showed triphasic morphology with diffuse encephalopathy.  Patient has alert and communicating at this time.  Significantly improved.  Continue Provigil. 7. Hypernatremia-receiving free water through core track feeding tube.  Sodium level has improved to 139. 8. Nutrition-patient is on core track feeding tube.  Palliative care to discuss whether patient and family agreeable for PEG tube, as he has inadequate p.o. intake.    Scheduled medications:   . aspirin  81 mg Per Tube Daily  . chlorhexidine gluconate (MEDLINE KIT)  15 mL Mouth Rinse BID  . Chlorhexidine Gluconate Cloth  6 each Topical Daily  . enoxaparin (LOVENOX) injection  40 mg Subcutaneous Q24H  . feeding supplement  1 Container Oral TID BM  . feeding supplement (PROSource TF)  90 mL Per Tube BID  . fiber  1 packet Oral BID  .  hydrALAZINE  25 mg Per Tube Q8H  . insulin aspart  0-9 Units Subcutaneous TID AC & HS  . isosorbide dinitrate  10 mg Per Tube TID   . mouth rinse  15 mL Mouth Rinse q12n4p  . modafinil  100 mg Per Tube Daily  . pantoprazole sodium  40 mg Per Tube Daily  . rosuvastatin  10 mg Per Tube Daily  . spironolactone  12.5 mg Oral Daily  . ticagrelor  90 mg Per Tube BID         Data Reviewed:   CBG:  Recent Labs  Lab 01/30/21 1140 01/30/21 1615 01/30/21 2125 01/31/21 0603 01/31/21 1109  GLUCAP 106* 143* 114* 113* 94    SpO2: 99 % O2 Flow Rate (L/min): 2 L/min FiO2 (%): 30 %    Vitals:   01/30/21 2007 01/31/21 0000 01/31/21 0341 01/31/21 0752  BP: 117/62 123/63 126/69   Pulse: 87  79 84  Resp: _0 Temp: 98.1 F (36.7 C) 98.1 F (36.7 C) 98.1 F (36.7 C) 97.6 F (36.4 C)  TempSrc: Oral Oral Oral Oral  SpO2: 98% 100% 100% 99%  Weight:   82.6 kg   Height:         Intake/Output Summary (Last 24 hours) at 01/31/2021 1444 Last data filed at 01/31/2021 1404 Gross per 24 hour  Intake 1713 ml  Output 750 ml  Net 963 ml    05/10 1901 - 05/12 0700 In: 2533.1 [P.O.:1320] Out: 2427 [Urine:2426]  Filed Weights   01/29/21 0500 01/30/21 0332 01/31/21 0341  Weight: 82.4 kg 83.6 kg 82.6 kg    CBC:  Recent Labs  Lab 01/25/21 0524 01/27/21 0924 01/29/21 0334 01/30/21 0303 01/31/21 0316  WBC 21.6* 12.4* 15.2* 16.4* 14.6*  HGB 8.3* 8.6* 8.1* 8.2* 7.4*  HCT 25.9* 26.6* 25.1* 25.9* 23.0*  PLT 319 285 271 231 212  MCV 98.9 102.7* 97.3 94.9 96.2  MCH 31.7 33.2 31.4 30.0 31.0  MCHC 32.0 32.3 32.3 31.7 32.2  RDW 18.3* 17.6* 16.5* 16.7* 17.1*    Complete metabolic panel:  Recent Labs  Lab 01/25/21 0524 01/26/21 0302 01/27/21 0202 01/27/21 0924 01/28/21 0700 01/29/21 0334 01/30/21 0303 01/31/21 0316  NA 150* 150*  --  145  --  140 139 136  K 3.6 3.6  --  3.7  --  3.6 3.8 3.6  CL 112* 112*  --  113*  --  108 109 109  CO2 26 24  --  23  --  21* 20* 18*  GLUCOSE 179* 153*  --  142*  --  126* 119* 110*  BUN 122* 120*  --  100*  --  76* 64* 57*  CREATININE 3.13* 3.00*  --  2.41*  --   2.13* 1.96* 1.96*  CALCIUM 8.6* 8.5*  --  8.1*  --  8.2* 8.3* 8.1*  AST 40 80*  --  122*  --   --   --  26  ALT 101* 127*  --  203*  --   --   --  83*  ALKPHOS 127* 135*  --  119  --   --   --  81  BILITOT 0.8 0.5  --  0.6  --   --   --  0.4  ALBUMIN 2.4* 2.4*  --  2.4*  --  2.3*  --  2.2*  MG 2.4 2.5* 2.3  --  2.0 2.0 1.9 1.8    No results for input(s):  LIPASE, AMYLASE in the last 168 hours.  No results for input(s): CRP, DDIMER, BNP, PROCALCITON, SARSCOV2NAA in the last 168 hours.  Invalid input(s): LACTICACID  ------------------------------------------------------------------------------------------------------------------ No results for input(s): CHOL, HDL, LDLCALC, TRIG, CHOLHDL, LDLDIRECT in the last 72 hours.  Lab Results  Component Value Date   HGBA1C 5.7 (H) 01/06/2021   ------------------------------------------------------------------------------------------------------------------ No results for input(s): TSH, T4TOTAL, T3FREE, THYROIDAB in the last 72 hours.  Invalid input(s): FREET3 ------------------------------------------------------------------------------------------------------------------ No results for input(s): VITAMINB12, FOLATE, FERRITIN, TIBC, IRON, RETICCTPCT in the last 72 hours.  Coagulation profile No results for input(s): INR, PROTIME in the last 168 hours. No results for input(s): DDIMER in the last 72 hours.  Cardiac Enzymes No results for input(s): CKTOTAL, CKMB, CKMBINDEX, TROPONINI in the last 168 hours.  ------------------------------------------------------------------------------------------------------------------ No results found for: BNP   Antibiotics: Anti-infectives (From admission, onward)   Start     Dose/Rate Route Frequency Ordered Stop   01/27/21 2200  cefTAZidime (FORTAZ) 2 g in sodium chloride 0.9 % 100 mL IVPB        2 g 200 mL/hr over 30 Minutes Intravenous Every 12 hours 01/27/21 1403     01/27/21 1500  cefTAZidime  (FORTAZ) 2 g in sodium chloride 0.9 % 100 mL IVPB  Status:  Discontinued        2 g 200 mL/hr over 30 Minutes Intravenous Every 12 hours 01/27/21 1402 01/27/21 1403   01/25/21 2200  cefTRIAXone (ROCEPHIN) 1 g in sodium chloride 0.9 % 100 mL IVPB  Status:  Discontinued       Note to Pharmacy: Dose was documented as given earlier this evening but IV became disconnected at some point and RN thinks he may not have received the dose   1 g 200 mL/hr over 30 Minutes Intravenous Every 24 hours 01/25/21 2107 01/27/21 1402   01/25/21 1830  cefTRIAXone (ROCEPHIN) 1 g in sodium chloride 0.9 % 100 mL IVPB  Status:  Discontinued        1 g 200 mL/hr over 30 Minutes Intravenous Every 24 hours 01/25/21 1740 01/25/21 2107   01/13/21 1400  piperacillin-tazobactam (ZOSYN) IVPB 3.375 g  Status:  Discontinued        3.375 g 12.5 mL/hr over 240 Minutes Intravenous Every 8 hours 01/13/21 1030 01/15/21 0813   01/13/21 1200  piperacillin-tazobactam (ZOSYN) IVPB 3.375 g  Status:  Discontinued        3.375 g 100 mL/hr over 30 Minutes Intravenous Every 6 hours 01/13/21 0955 01/13/21 1030   01/13/21 1045  ceFEPIme (MAXIPIME) 2 g in sodium chloride 0.9 % 100 mL IVPB  Status:  Discontinued        2 g 200 mL/hr over 30 Minutes Intravenous Every 12 hours 01/13/21 0949 01/13/21 0954   01/08/21 1100  cefTRIAXone (ROCEPHIN) 2 g in sodium chloride 0.9 % 100 mL IVPB  Status:  Discontinued        2 g 200 mL/hr over 30 Minutes Intravenous Every 24 hours 01/07/21 1244 01/10/21 1012   01/08/21 0600  vancomycin (VANCOREADY) IVPB 1250 mg/250 mL  Status:  Discontinued        1,250 mg 166.7 mL/hr over 90 Minutes Intravenous Every 48 hours 01/06/21 2202 01/08/21 1010   01/07/21 1108  ceFEPIme (MAXIPIME) 2 g in sodium chloride 0.9 % 100 mL IVPB  Status:  Discontinued        2 g 200 mL/hr over 30 Minutes Intravenous Every 24 hours 01/06/21 2202 01/07/21 1244   01/06/21 1200  vancomycin (  VANCOREADY) IVPB 1000 mg/200 mL  Status:   Discontinued        1,000 mg 200 mL/hr over 60 Minutes Intravenous Every 24 hours 01/05/21 2209 01/06/21 2202   01/06/21 1200  ceFEPIme (MAXIPIME) 2 g in sodium chloride 0.9 % 100 mL IVPB  Status:  Discontinued        2 g 200 mL/hr over 30 Minutes Intravenous Every 12 hours 01/05/21 2209 01/06/21 2202   01/04/21 1400  ceFEPIme (MAXIPIME) 2 g in sodium chloride 0.9 % 100 mL IVPB        2 g 200 mL/hr over 30 Minutes Intravenous Every 8 hours 01/04/21 1335 01/05/21 2231   01/04/21 1000  ceFEPIme (MAXIPIME) 2 g in sodium chloride 0.9 % 100 mL IVPB  Status:  Discontinued        2 g 200 mL/hr over 30 Minutes Intravenous Every 12 hours 01/03/21 2131 01/04/21 1335   01/04/21 0600  vancomycin (VANCOREADY) IVPB 750 mg/150 mL  Status:  Discontinued        750 mg 150 mL/hr over 60 Minutes Intravenous Every 12 hours 01/03/21 1436 01/05/21 2209   01/03/21 2200  vancomycin (VANCOREADY) IVPB 750 mg/150 mL  Status:  Discontinued        750 mg 150 mL/hr over 60 Minutes Intravenous Every 12 hours 01/03/21 1241 01/03/21 1436   01/03/21 1400  ceFEPIme (MAXIPIME) 2 g in sodium chloride 0.9 % 100 mL IVPB  Status:  Discontinued        2 g 200 mL/hr over 30 Minutes Intravenous Every 8 hours 01/03/21 1238 01/03/21 2131   01/03/21 1330  vancomycin (VANCOREADY) IVPB 1250 mg/250 mL        1,250 mg 166.7 mL/hr over 90 Minutes Intravenous  Once 01/03/21 1241 01/03/21 1640       Radiology Reports  No results found.    DVT prophylaxis: Lovenox  Code Status: Full code  Family Communication: No family at bedside   Consultants:  PCCM  Cardiology  Nephrology  Procedures:  Intubation  Cardiac catheterization  CRRT    Objective    Physical Examination:    General-appears in no acute distress, core track feeding tube in place  Heart-S1-S2, regular, no murmur auscultated  Lungs-clear to auscultation bilaterally, no wheezing or crackles auscultated  Abdomen-soft, nontender, no  organomegaly  Extremities-no edema in the lower extremities  Neuro-alert, oriented x3, no focal deficit noted   Status is: Inpatient  Dispo: The patient is from: Home              Anticipated d/c is to: Skilled nursing facility versus inpatient rehab              Anticipated d/c date is: 02/05/2021              Patient currently not stable for discharge  Barrier to discharge-awaiting rehab evaluation  COVID-19 Labs  No results for input(s): DDIMER, FERRITIN, LDH, CRP in the last 72 hours.  Lab Results  Component Value Date   [place] NEGATIVE 01/03/2021    Microbiology  Recent Results (from the past 240 hour(s))  Culture, Urine     Status: Abnormal   Collection Time: 01/25/21  8:48 AM   Specimen: Urine, Catheterized  Result Value Ref Range Status   Specimen Description URINE, CATHETERIZED  Final   Special Requests   Final    NONE Performed at [facility], [address], [place], [place] [address]    Culture 80,000 COLONIES/mL PSEUDOMONAS  AERUGINOSA (A)  Final   Report Status 01/27/2021 FINAL  Final   Organism ID, Bacteria PSEUDOMONAS AERUGINOSA (A)  Final      Susceptibility   Pseudomonas aeruginosa - MIC*    CEFTAZIDIME 4 SENSITIVE Sensitive     CIPROFLOXACIN <=0.25 SENSITIVE Sensitive     GENTAMICIN <=1 SENSITIVE Sensitive     IMIPENEM 2 SENSITIVE Sensitive     PIP/TAZO 16 SENSITIVE Sensitive     CEFEPIME 2 SENSITIVE Sensitive     * 80,000 COLONIES/mL PSEUDOMONAS AERUGINOSA  Culture, blood (routine x 2)     Status: None   Collection Time: 01/25/21 10:53 AM   Specimen: BLOOD LEFT FOREARM  Result Value Ref Range Status   Specimen Description BLOOD LEFT FOREARM  Final   Special Requests   Final    BOTTLES DRAWN AEROBIC ONLY Blood Culture results may not be optimal due to an excessive volume of blood received in culture bottles   Culture   Final    NO GROWTH 5 DAYS Performed at [facility], [address], [place], [place] [address]    Report  Status 01/30/2021 FINAL  Final  Culture, blood (routine x 2)     Status: None   Collection Time: 01/25/21 10:53 AM   Specimen: BLOOD RIGHT FOREARM  Result Value Ref Range Status   Specimen Description BLOOD RIGHT FOREARM  Final   Special Requests   Final    BOTTLES DRAWN AEROBIC ONLY Blood Culture adequate volume   Culture   Final    NO GROWTH 5 DAYS Performed at [facility], [place] [address], [place], [place] [address]    Report Status 01/30/2021 FINAL  Final  Cath Tip Culture     Status: Abnormal   Collection Time: 01/25/21  8:00 PM   Specimen: Catheter Tip; Other  Result Value Ref Range Status   Specimen Description CATH TIP  Final   Special Requests   Final    NONE Performed at [facility], [place] [address] [place], [place] [address]    Culture 3,000 COLONIES/mL STAPHYLOCOCCUS EPIDERMIDIS (A)  Final   Report Status 01/30/2021 FINAL  Final   Organism ID, Bacteria STAPHYLOCOCCUS EPIDERMIDIS (A)  Final      Susceptibility   Staphylococcus epidermidis - MIC*    CIPROFLOXACIN <=0.5 SENSITIVE Sensitive     ERYTHROMYCIN <=0.25 SENSITIVE Sensitive     GENTAMICIN <=0.5 SENSITIVE Sensitive     OXACILLIN >=4 RESISTANT Resistant     TETRACYCLINE 2 SENSITIVE Sensitive     VANCOMYCIN 1 SENSITIVE Sensitive     TRIMETH/SULFA <=10 SENSITIVE Sensitive     CLINDAMYCIN <=0.25 SENSITIVE Sensitive     RIFAMPIN <=0.5 SENSITIVE Sensitive     Inducible Clindamycin NEGATIVE Sensitive     * 3,000 COLONIES/mL STAPHYLOCOCCUS EPIDERMIDIS    Pressure Injury 01/20/21 Heel Left Deep Tissue Pressure Injury - Purple or maroon localized area of discolored intact skin or blood-filled blister due to damage of underlying soft tissue from pressure and/or shear. (Active)  01/20/21 0100  Location: Heel  Location Orientation: Left  Staging: Deep Tissue Pressure Injury - Purple or maroon localized area of discolored intact skin or blood-filled blister due to damage of underlying soft tissue from  pressure and/or shear.  Wound Description (Comments):   Present on Admission: No       [facility] If 7PM-7AM, please contact night-coverage at [email or web address], Office  [phone]   01/31/2021, 2:44 PM  LOS:  28 days

## 2021-01-31 NOTE — TOC Progression Note (Signed)
Transition of Care (TOC) - Progression Note  Heart Failure  Patient Details  Name: [name] MRN: [id number] Date of Birth: 1950/12/20  Transition of Care [facility]) CM/SW [place], [place] Phone Number: 01/31/2021, 5:09 PM  Clinical Narrative:    CSW following patient for discharge needs and reached out to registered dietician to keep updated about patients cor-trak as this is a barrier for SNF placement. Possible removal of cor-trak if patient agrees to PEG. SNF placement pending based on cor-trak. Palliative has been consulted.  CSW will continue to follow throughout discharge.    Expected Discharge Plan: [place] Barriers to Discharge: Continued Medical Work up  Expected Discharge Plan and Services Expected Discharge Plan: [place] In-house Referral: Clinical Social Work Discharge Planning Services: CM Consult Post Acute Care Choice: [place] arrangements for the past 2 months: Single Family Home                                       Social Determinants of Health (SDOH) Interventions    Readmission Risk Interventions No flowsheet data found.  Seaver Machia, MSW, [place] Heart Failure Social Worker

## 2021-01-31 NOTE — Progress Notes (Signed)
Pt started to be more confuse. Pt tried to get out of the bed 2x. However, pt able to reorient. Pt pulled his SCDs and pulling out his condom cath as well. Found his cor-trak was on the floor. Pt stated " I did not do anything it just came off".  Before feeding was started. Patient tolerated taking his crushed pill with pudding, able to finish his boost drink with no noted difficulty. -notified Hospitalist about the cort-trak.

## 2021-01-31 NOTE — Progress Notes (Addendum)
Palliative:  HPI: 70yo male with PMH for HTN, CAD, arthritis, cataracts, GERD, leg neuropathy admitted 01/03/21 with cardiogenic shock. Hospitalization complicated by VF arrest requiring CPR on multiple occasions. Slow progress with concern for anoxic brain injury but he is now becoming more awake and responsive and interactive. He also required HD briefly and continues to work with SLP on diet. Diet has been advanced but intake remains minimal.   I met today with [name]. He is awake and able to converse. Unfortunately he is still confused at times and struggles to follow topic. I attempted to discuss with him concern for poor intake and how this may be a barrier to how well he can improve and rehab. I discussed with him option of having surgical procedure to place PEG to continue tube feeding vs continuing to work with his diet and ultimately allowing him to eat/drink as well as he can with the understanding this may be limiting for him. Unfortunately he was unable to express his wishes and struggled to stay on subject.   I called and spoke with HCPOA, [name]. I expressed the above conversation with Merry Proud and concern for ongoing poor intake. I discussed with him the need for a plan for PEG placement vs allowing [name] to eat/drink what he is able. Merry Proud understands that [name] is still confused and unable to make this decision himself. Merry Proud shares that he feels best course of action is to follow Living Will and [name] indicated on Living Will that he would not want artificial nutrition.  We decided to continue Cortrak feeding as long as possible acknowledging this will be removed prior to transition to rehab and continue efforts to rehab and improve intake. We did discuss that intake may be a barrier to improvement and that if he declines further or fails to improve that we may need to reconsider goals of care and consider more comfort/hospice focused care in that scenario Merry Proud  understands.   All questions/concerns addressed. Emotional support provided.   Exam: Alert, confused. No distress. Generalized weakness and fatigue. Breathing regular, unlabored. Abd soft. Moves all extremities.   Plan: - NO desire to pursue PEG placement. This decision was made by [facility]. If [name] becomes more oriented and able to follow conversation to make an informed decision this could be readdressed. This decision is consistent with his documented wishes per his Living Will.  - Continue Cortrak as long as possible/indicated with understanding this is to be discontinued when otherwise medically stable for transition to rehab. Continue rehab efforts with hopes of improvement.  - Recommend considering regular diet (d/c heart healthy) to make food/drink more appealing in the interim.    [place], NP Palliative Medicine Team Pager [phone] (Please see [email or web address] for schedule) Team Phone [phone]    Greater than 50%  of this time was spent counseling and coordinating care related to the above assessment and plan

## 2021-02-01 DIAGNOSIS — G9340 Encephalopathy, unspecified: Secondary | ICD-10-CM | POA: Diagnosis not present

## 2021-02-01 DIAGNOSIS — N170 Acute kidney failure with tubular necrosis: Secondary | ICD-10-CM | POA: Diagnosis not present

## 2021-02-01 DIAGNOSIS — J9601 Acute respiratory failure with hypoxia: Secondary | ICD-10-CM | POA: Diagnosis not present

## 2021-02-01 DIAGNOSIS — R570 Cardiogenic shock: Secondary | ICD-10-CM | POA: Diagnosis not present

## 2021-02-01 LAB — CBC
HCT: 24.8 % — ABNORMAL LOW (ref 39.0–52.0)
Hemoglobin: 8.1 g/dL — ABNORMAL LOW (ref 13.0–17.0)
MCH: 32 pg (ref 26.0–34.0)
MCHC: 32.7 g/dL (ref 30.0–36.0)
MCV: 98 fL (ref 80.0–100.0)
Platelets: 196 10*3/uL (ref 150–400)
RBC: 2.53 MIL/uL — ABNORMAL LOW (ref 4.22–5.81)
RDW: 17.3 % — ABNORMAL HIGH (ref 11.5–15.5)
WBC: 10.6 10*3/uL — ABNORMAL HIGH (ref 4.0–10.5)
nRBC: 0 % (ref 0.0–0.2)

## 2021-02-01 LAB — BASIC METABOLIC PANEL
Anion gap: 8 (ref 5–15)
BUN: 50 mg/dL — ABNORMAL HIGH (ref 8–23)
CO2: 16 mmol/L — ABNORMAL LOW (ref 22–32)
Calcium: 8.2 mg/dL — ABNORMAL LOW (ref 8.9–10.3)
Chloride: 112 mmol/L — ABNORMAL HIGH (ref 98–111)
Creatinine, Ser: 1.82 mg/dL — ABNORMAL HIGH (ref 0.61–1.24)
GFR, Estimated: 39 mL/min — ABNORMAL LOW (ref >60)
Glucose, Bld: 97 mg/dL (ref 70–99)
Potassium: 4.2 mmol/L (ref 3.5–5.1)
Sodium: 136 mmol/L (ref 135–145)

## 2021-02-01 LAB — GLUCOSE, CAPILLARY
Glucose-Capillary: 86 mg/dL (ref 70–99)
Glucose-Capillary: 147 mg/dL — ABNORMAL HIGH (ref 70–99)
Glucose-Capillary: 75 mg/dL (ref 70–99)
Glucose-Capillary: 92 mg/dL (ref 70–99)

## 2021-02-01 LAB — IRON AND TIBC
Iron: 86 ug/dL (ref 45–182)
Saturation Ratios: 34 % (ref 17.9–39.5)
TIBC: 256 ug/dL (ref 250–450)
UIBC: 170 ug/dL

## 2021-02-01 LAB — FERRITIN: Ferritin: 749 ng/mL — ABNORMAL HIGH (ref 24–336)

## 2021-02-01 LAB — MAGNESIUM: Magnesium: 1.9 mg/dL (ref 1.7–2.4)

## 2021-02-01 MED ORDER — LOSARTAN POTASSIUM 25 MG PO TABS: 25.0000 mg | ORAL_TABLET | Freq: Every day | ORAL | Status: DC

## 2021-02-01 MED ORDER — LOSARTAN POTASSIUM 25 MG PO TABS
25.0000 mg | ORAL_TABLET | Freq: Every day | ORAL | Status: DC
  Administered 2021-02-01 – 2021-02-07 (×7): 25 mg via ORAL
  Filled 2021-02-01 (×7): qty 1

## 2021-02-01 NOTE — Progress Notes (Signed)
Physical Therapy Treatment Patient Details Name: Antonis Lor MRN: [id number] DOB: 1951/08/29 Today's Date: 02/01/2021    History of Present Illness Pt is 70 yo admitted 4/14 with chest pain and STEMI with bradycardia and hypotension. Pt taken for urgent cath with temporary pacer placed but pt decompensated to cardiac arrest with intubation and CPR. Stent placed and pt with further complication of cardiogenic shock with Impella placed. 4/18 seizure like activity, Impella removed 4/20, 4/20 MRI with scattered micro embolic infarctions. Extubated 4/24 and reintubated 2 hrs later. Extubated 4/26. Reintubated 01/20/21. Extubated 01/23/21.  PMHx: HTN, HLD, CAD    PT Comments    Pt tolerates treatment well with increased ambulation distances at this time. Pt continues to demonstrate memory deficits, vaguely remembering PT session from yesterday however continuing to require consistent cues for technique when transferring and mobilizing. Pt also demonstrating improvements in awareness, reporting his legs are weak and he is uncertain about his balance. Pt will continue to benefit from aggressive mobilization to improve functional mobility quality and to aide in a return to independent mobility. PT continues to recommend SNF placement.   Follow Up Recommendations  SNF (no reliable caregiver support)     Equipment Recommendations  Rolling walker with 5" wheels    Recommendations for Other Services       Precautions / Restrictions Precautions Precautions: Fall;Other (comment) Precaution Comments: expressive and receptive aphasia qualities Restrictions Weight Bearing Restrictions: No    Mobility  Bed Mobility Overal bed mobility: Needs Assistance Bed Mobility: Supine to Sit     Supine to sit: Supervision          Transfers Overall transfer level: Needs assistance Equipment used: Rolling walker (2 wheeled) Transfers: Sit to/from Stand Sit to Stand: Min guard         General  transfer comment: cues for hand placement with transfers  Ambulation/Gait Ambulation/Gait assistance: Min guard Gait Distance (Feet): 200 Feet (additional trial of 120') Assistive device: Rolling walker (2 wheeled) Gait Pattern/deviations: Step-through pattern Gait velocity: reduced Gait velocity interpretation: 1.31 - 2.62 ft/sec, indicative of limited community ambulator General Gait Details: pt with slowed step-through gait, bumping into 2 objects when walking however is able to correct after hitting object. Pt with less drift this session   Stairs             Wheelchair Mobility    Modified Rankin (Stroke Patients Only) Modified Rankin (Stroke Patients Only) Pre-Morbid Rankin Score: No symptoms Modified Rankin: Moderately severe disability     Balance Overall balance assessment: Needs assistance Sitting-balance support: No upper extremity supported;Feet supported Sitting balance-Leahy Scale: Good     Standing balance support: Single extremity supported Standing balance-Leahy Scale: Poor Standing balance comment: reliant on UE support of walker                            Cognition Arousal/Alertness: Awake/alert Behavior During Therapy: WFL for tasks assessed/performed Overall Cognitive Status: Impaired/Different from baseline Area of Impairment: Attention;Memory;Following commands;Safety/judgement;Awareness;Problem solving                   Current Attention Level: Sustained Memory: Decreased short-term memory Following Commands: Follows one step commands consistently Safety/Judgement: Decreased awareness of safety Awareness: Intellectual Problem Solving: Requires verbal cues;Difficulty sequencing        Exercises      General Comments General comments (skin integrity, edema, etc.): VSS on RA      Pertinent Vitals/Pain Pain Assessment: No/denies pain  Home Living                      Prior Function            PT  Goals (current goals can now be found in the care plan section) Acute Rehab PT Goals Patient Stated Goal: To get better and go home. Progress towards PT goals: Progressing toward goals    Frequency    Min 3X/week      PT Plan Current plan remains appropriate    Co-evaluation              AM-PAC PT "6 Clicks" Mobility   Outcome Measure  Help needed turning from your back to your side while in a flat bed without using bedrails?: None Help needed moving from lying on your back to sitting on the side of a flat bed without using bedrails?: A Little Help needed moving to and from a bed to a chair (including a wheelchair)?: A Little Help needed standing up from a chair using your arms (e.g., wheelchair or bedside chair)?: A Little Help needed to walk in hospital room?: A Little Help needed climbing 3-5 steps with a railing? 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

## 2021-02-01 NOTE — NC FL2 (Signed)
[place] LEVEL OF CARE SCREENING TOOL     IDENTIFICATION  Patient Name: [name] Birthdate: 01-Oct-1950 Sex: male Admission Date (Current Location): 01/03/2021  [facility] and [place] Number:  Herbalist and Address:  [facility] [place] [address] [place], [place] [address]      Provider Number: [id number]  Attending Physician Name and Address:  Oswald Hillock, MD  Relative Name and Phone Number:  Reyne Dumas [phone]    Current Level of Care: Hospital Recommended Level of Care: [place] Prior Approval Number:    Date Approved/Denied:   PASRR Number: [id number]  Discharge Plan: SNF    Current Diagnoses: Patient Active Problem List   Diagnosis Date Noted  . Hypertension   . Hemoptysis   . Acute hypoxemic respiratory failure ([place])   . Acute renal failure ([place])   . Encephalopathy acute   . Cardiogenic shock ([place]) 01/03/2021  . Atypical chest pain 10/25/2019  . Hyperglycemia 10/12/2019  . Coronary artery calcification seen on CAT scan 06/15/2019  . Nodule of lower lobe of left lung 06/15/2019  . Tremor of right hand 04/24/2017  . Numbness and tingling of both legs 04/24/2017  . Bilateral leg edema 06/05/2015  . Chronic neck pain 06/05/2015  . Plantar fasciitis 02/01/2014  . Visual floaters 04/04/2013  . Subjective vision disturbance, left 04/04/2013  . 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 MELANOMA, HX OF 06/01/2009  . NECK PAIN 09/11/2008  . CELLULITIS AND ABSCESS OF LEG EXCEPT FOOT 04/26/2008  . ERECTILE DYSFUNCTION 10/12/2007  . Hyperlipemia, mixed 04/12/2007  . Anxiety state 04/12/2007  . Essential hypertension 04/12/2007  . GERD 04/12/2007    Orientation RESPIRATION BLADDER Height & Weight      (Alert but has aphasia)  Normal Incontinent (Urethral catheter) Weight: 181 lb 8 oz (82.3 kg) Height:  _0  (185.4 cm)  BEHAVIORAL SYMPTOMS/MOOD NEUROLOGICAL BOWEL NUTRITION  STATUS      Incontinent (rectal tube)    AMBULATORY STATUS COMMUNICATION OF NEEDS Skin   Extensive Assist  (expressive reflective aphasia difficulty speaking) Normal                       Personal Care Assistance Level of Assistance  Bathing,Feeding,Dressing Bathing Assistance: Maximum assistance Feeding assistance: Maximum assistance Dressing Assistance: Maximum assistance     Functional Limitations Info  Sight,Hearing,Speech Sight Info: Impaired (left and right edema) Hearing Info: Adequate Speech Info: Impaired (expressive reflective aphasia difficulty speaking)    SPECIAL CARE FACTORS FREQUENCY  PT (By licensed PT),OT (By licensed OT)     PT Frequency: 5 X per week OT Frequency: 5 X per week            Contractures Contractures Info: Not present    Additional Factors Info  Code Status,Allergies Code Status Info: Full Allergies Info: Codeine, Lisinopril           Current Medications (02/01/2021):  This is the current hospital active medication list Current Facility-Administered Medications  Medication Dose Route Frequency Provider Last Rate Last Admin  . acetaminophen (TYLENOL) 160 MG/5ML solution 650 mg  650 mg Per Tube Q4H PRN Jacky Kindle, MD   650 mg at 01/31/21 2201  . aspirin chewable tablet 81 mg  81 mg Per Tube Daily Audria Nine, DO   81 mg at 02/01/21 1010  . atropine 1 % ophthalmic solution 4 drop  4 drop Sublingual QID PRN Cristal Ford, DO      .  cefTAZidime (FORTAZ) 2 g in sodium chloride 0.9 % 100 mL IVPB  2 g Intravenous Q12H Oswald Hillock, MD 200 mL/hr at 02/01/21 1020 2 g at 02/01/21 1020  . chlorhexidine gluconate (MEDLINE KIT) (PERIDEX) 0.12 % solution 15 mL  15 mL Mouth Rinse BID Cristal Ford, DO   15 mL at 02/01/21 1014  . enoxaparin (LOVENOX) injection 40 mg  40 mg Subcutaneous Q24H Pokhrel, Laxman, MD   40 mg at 02/01/21 1015  . feeding supplement (BOOST / RESOURCE BREEZE) liquid 1 Container  1 Container Oral TID BM Oswald Hillock, MD   1 Container at 02/01/21 1539  . hydrALAZINE (APRESOLINE) tablet 25 mg  25 mg Per Tube Q8H Bensimhon, Shaune Pascal, MD   25 mg at 02/01/21 1544  . insulin aspart (novoLOG) injection 0-9 Units  0-9 Units Subcutaneous TID AC & HS Pokhrel, Laxman, MD   1 Units at 02/01/21 1215  . ipratropium-albuterol (DUONEB) 0.5-2.5 (3) MG/3ML nebulizer solution 3 mL  3 mL Nebulization Q4H PRN Bensimhon, Shaune Pascal, MD   3 mL at 01/19/21 1752  . isosorbide dinitrate (ISORDIL) tablet 10 mg  10 mg Per Tube TID Bensimhon, Shaune Pascal, MD   10 mg at 02/01/21 1538  . losartan (COZAAR) tablet 25 mg  25 mg Oral Daily Einar Grad, RPH   25 mg at 02/01/21 1538  . MEDLINE mouth rinse  15 mL Mouth Rinse q12n4p Audria Nine, DO   15 mL at 02/01/21 1214  . modafinil (PROVIGIL) tablet 100 mg  100 mg Per Tube Daily Audria Nine, DO   100 mg at 02/01/21 1011  . ondansetron (ZOFRAN) injection 4 mg  4 mg Intravenous Q6H PRN Clegg, Amy D, NP      . pantoprazole sodium (PROTONIX) 40 mg/20 mL oral suspension 40 mg  40 mg Per Tube Daily Audria Nine, DO   40 mg at 02/01/21 1539  . rosuvastatin (CRESTOR) tablet 10 mg  10 mg Per Tube Daily Audria Nine, DO   10 mg at 02/01/21 1011  . sodium chloride flush (NS) 0.9 % injection 10-40 mL  10-40 mL Intracatheter PRN Clegg, Amy D, NP      . spironolactone (ALDACTONE) tablet 12.5 mg  12.5 mg Oral Daily Katherine Roan, MD   12.5 mg at 02/01/21 1011  . ticagrelor (BRILINTA) tablet 90 mg  90 mg Per Tube BID Audria Nine, DO   90 mg at 02/01/21 1011  . Zinc Oxide (TRIPLE PASTE) 12.8 % ointment   Topical PRN Pershing Proud, NP         Discharge Medications: Please see discharge summary for a list of discharge medications.  Relevant Imaging Results:  Relevant Lab Results:   Additional Information SS#: [id number] [place] COVID-19 Vaccine 07/07/2020 , 11/26/2019 , 10/31/2019  Tymber Stallings, LCSWA

## 2021-02-01 NOTE — TOC Progression Note (Signed)
Transition of Care [facility]) - Progression Note    Patient Details  Name: Romario Tith MRN: [id number] Date of Birth: 06-Apr-1951  [facility] CM/SW Contact  Zenon Mayo, RN Phone Number: 02/01/2021, 4:18 PM  Clinical Narrative:    CIR rec SNF for patient, [place] CSW following for SNF workup, patient pulled out cortrak tube today.  TOC will continue to follow.   Expected Discharge Plan: [place] Barriers to Discharge: Continued Medical Work up  Expected Discharge Plan and Services Expected Discharge Plan: [place] In-house Referral: Clinical Social Work Discharge Planning Services: CM Consult Post Acute Care Choice: [place] arrangements for the past 2 months: Single Family Home                                       Social Determinants of Health (SDOH) Interventions    Readmission Risk Interventions No flowsheet data found.

## 2021-02-01 NOTE — Progress Notes (Addendum)
  Speech Language Pathology Treatment: Dysphagia  Patient Details Name: [name] MRN: [id number] DOB: 1951-07-06 Today's Date: 02/01/2021 Time: 3154-0086 SLP Time Calculation (min) (ACUTE ONLY): 27 min  Assessment / Plan / Recommendation Clinical Impression  Pt seen for dysphagia tx with independent consumption of thin and regular consistencies (including mixed consistency) without overt s/s of aspiration noted throughout session.  SLP provided education re: aspiration/swallowing precautions (general) for consumption of diet d/t inattention during meals.  Pt using slow rate of consumption and small bites/sips independently during session with upright posture as well creating minimal aspiration risk.    Pt participated in simple problem solving with functional tasks (ie: everyday activities) with 80% accuracy given min verbal cues.  Sequencing tasks with personal care functional activities with 70% requiring verbal cues to complete all steps.  Categorization tasks with familiar categories with 50% accuracy as pt had a difficult time generating answers d/t inattention/tangential nature of speech.  Information given during conversation at times was nonsensical and off topic.  Pt stated awareness of this occurrence, but continued this pattern within conversation/various tasks administered.  ST will s/o for swallowing at this time as pt is tolerating current diet without difficulty.  ST will continue to f/u for cognitive/linguistic deficits in acute setting.  Recommend CIR.     HPI HPI: Pt is 70 y.o. with PMH of  HTN, HLD, CAD, admitted with chest pain and STEMI with bradycardia and hypotension. Pt taken for urgent cath with temporary pacer placed but pt decompensated to cardiac arrest with intubation and CPR. Stent placed and pt with further complication of cardiogenic shock with Impella placed. 4/18 seizure like activity, Impella removed 4/20, 4/20 MRI with scattered micro embolic infarctions. ETT  4/14-4/24 and reintubated 2 hrs later. Extubated 4/26. EEG (4/23) revealed moderate diffuse encephalopathy, nonspecific etiology. Chest xray (4/24) revealed:"Bibasilar opacities are mildly worsened in the interval worrisome for pneumonia". Failed [facility] Swallow Screen 2/26. Patient with subsequent reintubation on 5/2 with interval extubation by at least 5/6.  Most recent chest xray dated 05/06 was showing no acdtive cardiopulmonary disease.      SLP Plan  Discharge SLP treatment due to (comment) (achieving goals for swallowing; continue ST prn at next venue of care)       Recommendations  Diet recommendations: Regular;Thin liquid Liquids provided via: Cup;Straw Medication Administration: Whole meds with liquid Supervision: Intermittent supervision to cue for compensatory strategies;Patient able to self feed;Staff to assist with self feeding Compensations: Slow rate;Small sips/bites Postural Changes and/or Swallow Maneuvers: Seated upright 90 degrees                General recommendations: Other(comment) (TBD) Oral Care Recommendations: Oral care BID Follow up Recommendations: Inpatient Rehab SLP Visit Diagnosis: Cognitive communication deficit (R41.841);Dysphagia, unspecified (R13.10) Plan: Discharge SLP treatment due to (comment) (achieving goals for swallowing; continue ST prn at next venue of care)                      Elvina Sidle, M.S., [place] 02/01/2021, 12:51 PM

## 2021-02-01 NOTE — Progress Notes (Signed)
Nutrition Follow-up  DOCUMENTATION CODES:   Not applicable  INTERVENTION:   -Liberalize diet to regular -Continue Magic cup BID with meals, each supplement provides 290 kcal and 9 grams of protein -Boost Breeze po TID, each supplement provides 250 kcal and 9 grams of protein -D/c Nutrisource Fiber -D/c Prosource TF -D/c Osmolite 1.5   NUTRITION DIAGNOSIS:   Inadequate oral intake related to acute illness as evidenced by NPO status.  Progressing; advanced to PO diet on 01/28/21  GOAL:   Patient will meet greater than or equal to 90% of their needs  Progressing   MONITOR:   TF tolerance  REASON FOR ASSESSMENT:   Ventilator    ASSESSMENT:   70 yo male admitted with STEMI with cardiogenic shock, cardiac arrest and underwent PCI with stent to RCA, Impella placement  4/20 Impella removed. MRI brain with numerous micro embolic infarctions. Off pressors.  4/21 lasix drip stopped.  4/22 started CVVH 4/24 CVVH stopped. Failed extubation. Re-intubated.  4/26 Extubated  5/1 Re-intubated 5/4 Extubated, off pressors 5/9 s/p BSE- advanced to dysphagia 1 diet with thin liquids, calorie count initiated, transitioned to nocturnal feedings 5/12- calorie count completed- pt meeting ~50% of needs PO 5/13 pt pulled out cortrak  Reviewed I/O's: -1.1 L x 24 hours and -682 ml since 01/18/21  UOP: 2.5 L x 24 hours  Case discussed with MD and palliative care; pt and HCPOA do not desire PEG tube placement.   Case discussed with nurse tech and RN; pt pulled out his cortrak this morning and confirmed there are no plans to replace it. Per RN, pt ate about 50% of his breakfast. Nurse tech reports that pt is often selective about the foods that he will eat. Diet liberalized to regular to obtain widest variety of food selections.   Spoke with pt, who reports feeling better after cortrak has been removed. He thinks he weill be able to consume more food, as he shares it was uncomfortable to eat  with the tube in. Provided pt with an orange [employer], which he says he enjoyed. Discussed importance of good meal and supplement intake to promote healing.   Updated CSW on status of cortak tube; reports plan to discharge to SNF once medically stable.   Labs reviewed: CBGS: 86-94 (inpatient orders for glycemic control are 0-9 units insulin aspart TID before meals and at bedtime).  Diet Order:   Diet Order            Diet regular Room service appropriate? Yes with Assist; Fluid consistency: Thin  Diet effective now                 EDUCATION NEEDS:   Not appropriate for education at this time  Skin:  Skin Assessment: Reviewed RN Assessment  Last BM:  01/30/21  Height:   Ht Readings from Last 1 Encounters:  01/13/21 6\' 1"  (1.854 m)    Weight:   Wt Readings from Last 1 Encounters:  02/01/21 82.3 kg   BMI:  Body mass index is 23.95 kg/m.  Estimated Nutritional Needs:   Kcal:  2100-2300  Protein:  110-125 gm  Fluid:  >2 L/day    Loistine Chance, RD, LDN, [place] Registered Dietitian II Certified Diabetes Care and Education Specialist Please refer to [facility] for RD and/or RD on-call/weekend/after hours pager

## 2021-02-01 NOTE — Progress Notes (Signed)
Patient ID: [name], male   DOB: 18-Feb-1951, 70 y.o.   MRN: [id number]     Advanced Heart Failure Rounding Note  PCP-Cardiologist: None   Subjective:    Some confusion overnight, removed his cor-trak. Weight down 1/2 lb.  Cr better 1.82 from 1.96.  Overall feels good but does say he has been having a lot of trouble sleeping during the night.  Says he's not eating much due to not having much choice in his food and asks for a more control over his diet.  Says the loose stools have decreased.     Objective:   Weight Range: 82.3 kg Body mass index is 23.95 kg/m.   Vital Signs:   Temp:  [97.6 F (36.4 C)-99.1 F (37.3 C)] 98.7 F (37.1 C) (05/13 0425) Pulse Rate:  [82-84] 82 (05/12 1630) Resp:  [16-23] 20 (05/13 0425) BP: (123-133)/(65-78) 123/78 (05/13 0425) SpO2:  [99 %-100 %] 100 % (05/12 1942) Weight:  [82.3 kg] 82.3 kg (05/13 0109) Last BM Date: 01/30/21  Weight change: Filed Weights   01/30/21 0332 01/31/21 0341 02/01/21 0109  Weight: 83.6 kg 82.6 kg 82.3 kg    Intake/Output:   Intake/Output Summary (Last 24 hours) at 02/01/2021 0728 Last data filed at 02/01/2021 0658 Gross per 24 hour  Intake 1415 ml  Output 2500 ml  Net -1085 ml      Physical Exam   General:  Sitting in chair No resp difficulty Neck: supple. JVD 7. Carotids 2+ bilat; no bruits. No lymphadenopathy or thryomegaly appreciated. Cor: PMI nondisplaced. Regular rate & rhythm. No rubs, gallops or murmurs. Lungs: faint rales right base otherwise clear, no increased wob Abdomen: soft, nontender, nondistended. No hepatosplenomegaly. No bruits or masses. Good bowel sounds. Extremities: no cyanosis, clubbing, rash, no edema Neuro: alert, cranial nerves grossly intact. moves all 4 extremities w/o difficulty. Affect pleasant   Telemetry   SR 80s Personally reviewed  Labs    CBC Recent Labs    01/30/21 0303 01/31/21 0316  WBC 16.4* 14.6*  HGB 8.2* 7.4*  HCT 25.9* 23.0*  MCV 94.9 96.2   PLT 231 292   Basic Metabolic Panel Recent Labs    01/30/21 0303 01/31/21 0316 02/01/21 0325  NA 139 136 136  K 3.8 3.6 4.2  CL 109 109 112*  CO2 20* 18* 16*  GLUCOSE 119* 110* 97  BUN 64* 57* 50*  CREATININE 1.96* 1.96* 1.82*  CALCIUM 8.3* 8.1* 8.2*  MG 1.9 1.8 1.9  PHOS 3.8  --   --    Liver Function Tests Recent Labs    01/31/21 0316  AST 26  ALT 83*  ALKPHOS 81  BILITOT 0.4  PROT 5.7*  ALBUMIN 2.2*   No results for input(s): LIPASE, AMYLASE in the last 72 hours. Cardiac Enzymes No results for input(s): CKTOTAL, CKMB, CKMBINDEX, TROPONINI in the last 72 hours.  BNP: BNP (last 3 results) No results for input(s): BNP in the last 8760 hours.  ProBNP (last 3 results) No results for input(s): PROBNP in the last 8760 hours.   D-Dimer No results for input(s): DDIMER in the last 72 hours. Hemoglobin A1C No results for input(s): HGBA1C in the last 72 hours. Fasting Lipid Panel No results for input(s): CHOL, HDL, LDLCALC, TRIG, CHOLHDL, LDLDIRECT in the last 72 hours. Thyroid Function Tests No results for input(s): TSH, T4TOTAL, T3FREE, THYROIDAB in the last 72 hours.  Invalid input(s): FREET3  Other results:   Imaging    No results found.  Medications:     Scheduled Medications: . aspirin  81 mg Per Tube Daily  . chlorhexidine gluconate (MEDLINE KIT)  15 mL Mouth Rinse BID  . Chlorhexidine Gluconate Cloth  6 each Topical Daily  . enoxaparin (LOVENOX) injection  40 mg Subcutaneous Q24H  . feeding supplement  1 Container Oral TID BM  . feeding supplement (PROSource TF)  90 mL Per Tube BID  . fiber  1 packet Oral BID  . hydrALAZINE  25 mg Per Tube Q8H  . insulin aspart  0-9 Units Subcutaneous TID AC & HS  . isosorbide dinitrate  10 mg Per Tube TID  . mouth rinse  15 mL Mouth Rinse q12n4p  . modafinil  100 mg Per Tube Daily  . pantoprazole sodium  40 mg Per Tube Daily  . rosuvastatin  10 mg Per Tube Daily  . spironolactone  12.5 mg Oral Daily   . ticagrelor  90 mg Per Tube BID    Infusions: . cefTAZidime (FORTAZ)  IV 2 g (01/31/21 2155)  . feeding supplement (OSMOLITE 1.5 CAL) Stopped (01/31/21 2330)    PRN Medications: acetaminophen (TYLENOL) oral liquid 160 mg/5 mL, atropine, ipratropium-albuterol, ondansetron (ZOFRAN) IV, sodium chloride flush, Zinc Oxide    Assessment/Plan   1. CAD/ Acute Inferior STEMI - emergent cath w/ pRCA occlusion s/p PCI + DES - Continue DAPT/statin - no s/s angina  2. VF Arrest - in setting of acute MI, now s/p revascularization  - No further VF. Rhythm stable   3. Acute Systolic Heart Failure>>Cardiogenic Shock  - ICM Echo LVEF 20-25%, RV moderately reduced - Impella out 4/20. - CVVH stopped 4/24.  - Starting to retain a small amount of volume - As renal function continues to improve can expand GDMT.  - Continue  Hydral and nitrates - Continue spiro 12.5, will need to watch potassium closely as it is trending up - Cr better but bicarb lower with higher chloride non anion gap metabolic acidosis likely a combination of his diarrhea and renal injury.  Hesitant to add losartan today with acidosis and potassium trending up, hopefully can add soon. BP could certainly tolerate  -avoiding sglt2i with recent UTI   4. Acute hypoxic respiratory failure - 4/24 failed extubation trial.  - 5/1 reintubated due to inability to handle secretions/hemoptysis - 5/4 extubated   5. Nonoliguric AKI - due to ATN/shock - CVVH begun 4/22 and stopped 4/24. Had one session of iHD on 4/30 for clearance - Renal function now improving   6. Anoxic brain injury  - EEG w/ generalized periodic discharges with triphasic morphology + moderate to severe diffuse encephalopathy.  - MRI- numerous micro embolic infarctions . No hemorrhage or mass.  - CIR has turned down due to poor rehab potential. Recommending SNF - Dramatic improvement and re-evaluated by CIR however turned down again due to his need for long term  care which would be difficult to achieve after CIR.   - Awaiting SNF placement  7. Normocytic Anemia -likely combination of blood loss, acute illness and inflammation, renal dysfunction -order cbc, repeat iron studies  Length of Stay: 29  Dalton Roan, MD  7:28 AM   Patient seen and examined with the above-signed Advanced Practice Provider and/or Housestaff. I personally reviewed laboratory data, imaging studies and relevant notes. I independently examined the patient and formulated the important aspects of the plan. I have edited the note to reflect any of my changes or salient points. I have personally discussed the plan with  the patient and/or family.  He continues to make progress. Denies SOB, orthopnea or PND. Pulled Cor-trak out  General:  Sitting  No resp difficulty HEENT: normal Neck: supple. no JVD. Carotids 2+ bilat; no bruits. No lymphadenopathy or thryomegaly appreciated. Cor: PMI nondisplaced. Regular rate & rhythm. No rubs, gallops or murmurs. Lungs: clear Abdomen: soft, nontender, nondistended. No hepatosplenomegaly. No bruits or masses. Good bowel sounds. Extremities: no cyanosis, clubbing, rash, edema Neuro: alert & oriented, cranial nerves grossly intact. moves all 4 extremities w/o difficulty. Affect pleasant   Still struggling with intermittent confusion. Stable from HF perspective. Does have progressive non-gap acidosis. Need to follow closely. I am ok with starting low-dose losartan today.   HF team will see again on Monday. Will need SNF placement.   Dalton Bickers, MD  11:48 AM

## 2021-02-01 NOTE — TOC Progression Note (Addendum)
Transition of Care (TOC) - Progression Note  Heart Failure  Patient Details  Name: [name] MRN: [id number] Date of Birth: 08/17/1951  Transition of Care [facility]) CM/SW [place], [place] Phone Number: 02/01/2021, 5:35 PM  Clinical Narrative:    CSW finished SNF workup and faxed patient out in the hub for SNF placement since patient removed cortrak. Pending SNF bed offers to present to patient and Reyne Dumas, [phone].  TOC will continue to follow for discharge needs.   Expected Discharge Plan: [place] Barriers to Discharge: Continued Medical Work up  Expected Discharge Plan and Services Expected Discharge Plan: [place] In-house Referral: Clinical Social Work Discharge Planning Services: CM Consult Post Acute Care Choice: [place] arrangements for the past 2 months: Single Family Home                                       Social Determinants of Health (SDOH) Interventions    Readmission Risk Interventions No flowsheet data found.  Zsofia Prout, MSW, [place] Heart Failure Social Worker

## 2021-02-01 NOTE — Progress Notes (Signed)
Occupational Therapy Treatment Patient Details Name: [name] MRN: [id number] DOB: 1951/07/27 Today's Date: 02/01/2021    History of present illness Pt is 70 yo admitted 4/14 with chest pain and STEMI with bradycardia and hypotension. Pt taken for urgent cath with temporary pacer placed but pt decompensated to cardiac arrest with intubation and CPR. Stent placed and pt with further complication of cardiogenic shock with Impella placed. 4/18 seizure like activity, Impella removed 4/20, 4/20 MRI with scattered micro embolic infarctions. Extubated 4/24 and reintubated 2 hrs later. Extubated 4/26. Reintubated 01/20/21. Extubated 01/23/21.  PMHx: HTN, HLD, CAD   OT comments  Pt making progress with functional goals, however pt continues to demo deficits in strength, endurance, funcitonal activity tolerance for safe and independent ADLs/selfcare and ADL mobility. Session focused on functional mobility to bathroom using RW, toilet transfers, toileting tasks, grooming/hygiene standing at sink, ADLs seated EOB and standing. Pt required min verbal cues for safety during tasks. OT will continue to follow acutely to maximize level of function and safety  Follow Up Recommendations  SNF    Equipment Recommendations  3 in 1 bedside commode;Other (comment) (TBD at next venue of care)    Recommendations for Other Services      Precautions / Restrictions Precautions Precautions: Fall;Other (comment) Precaution Comments: expressive and receptive aphasia qualities Restrictions Weight Bearing Restrictions: No       Mobility Bed Mobility Overal bed mobility: Needs Assistance Bed Mobility: Sit to Supine     Supine to sit: Supervision Sit to supine: Supervision   General bed mobility comments: pt in recliner upon arrival, mi verbal cues to scoot to [facility]    Transfers Overall transfer level: Needs assistance Equipment used: Rolling walker (2 wheeled) Transfers: Sit to/from Stand Sit to Stand: Min  guard         General transfer comment: cues for hand placement with transfers    Balance Overall balance assessment: Needs assistance Sitting-balance support: No upper extremity supported;Feet supported Sitting balance-Leahy Scale: Good     Standing balance support: Single extremity supported;During functional activity;Bilateral upper extremity supported Standing balance-Leahy Scale: Poor Standing balance comment: reliant on UE support of walker                           ADL either performed or assessed with clinical judgement   ADL Overall ADL's : Needs assistance/impaired Eating/Feeding: Set up;Independent;Sitting   Grooming: Wash/dry hands;Wash/dry face;Min guard;Standing   Upper Body Bathing: Set up;Supervision/ safety;Sitting   Lower Body Bathing: Moderate assistance;Sitting/lateral leans Lower Body Bathing Details (indicate cue type and reason): simulated seated and standing at RW Upper Body Dressing : Set up;Supervision/safety;Sitting   Lower Body Dressing: Moderate assistance;Sitting/lateral leans;Sit to/from stand   Toilet Transfer: RW;Min guard;Ambulation;Comfort height toilet;Grab bars;Cueing for safety;Cueing for sequencing   Toileting- Clothing Manipulation and Hygiene: Minimal assistance;Sit to/from stand       Functional mobility during ADLs: Rolling walker;Cueing for sequencing;Cueing for safety;Min guard General ADL Comments: pt fatigues after ambulating to bathroom and sitting on toilet, required rest break before coming out of bathroom     Vision Baseline Vision/History: Wears glasses Wears Glasses: At all times Patient Visual Report: No change from baseline     Perception     Praxis      Cognition Arousal/Alertness: Awake/alert Behavior During Therapy: WFL for tasks assessed/performed Overall Cognitive Status: Impaired/Different from baseline Area of Impairment: Attention;Memory;Following  commands;Safety/judgement;Awareness;Problem solving  Current Attention Level: Sustained Memory: Decreased short-term memory Following Commands: Follows one step commands consistently Safety/Judgement: Decreased awareness of safety Awareness: Intellectual Problem Solving: Requires verbal cues;Difficulty sequencing          Exercises     Shoulder Instructions       General Comments VSS on RA    Pertinent Vitals/ Pain       Pain Assessment: No/denies pain Pain Score: 0-No pain Faces Pain Scale: No hurt Pain Intervention(s): Monitored during session;Repositioned  Home Living                                          Prior Functioning/Environment              Frequency  Min 2X/week        Progress Toward Goals  OT Goals(current goals can now be found in the care plan section)  Progress towards OT goals: Progressing toward goals  Acute Rehab OT Goals Patient Stated Goal: To get better and go home.  Plan Discharge plan remains appropriate    Co-evaluation                 AM-PAC OT "6 Clicks" Daily Activity     Outcome Measure   Help from another person eating meals?: None Help from another person taking care of personal grooming?: A Little Help from another person toileting, which includes using toliet, bedpan, or urinal?: A Lot Help from another person bathing (including washing, rinsing, drying)?: A Lot Help from another person to put on and taking off regular upper body clothing?: A Little Help from another person to put on and taking off regular lower body clothing?: A Lot 6 Click Score: 16    End of Session Equipment Utilized During Treatment: Gait belt;Rolling walker  OT Visit Diagnosis: Muscle weakness (generalized) (M62.81);Other abnormalities of gait and mobility (R26.89);Adult, failure to thrive (R62.7);Other symptoms and signs involving cognitive function Symptoms and signs involving cognitive  functions: Cerebral infarction Hemiplegia - Right/Left: Right Hemiplegia - dominant/non-dominant: Dominant Hemiplegia - caused by: Cerebral infarction   Activity Tolerance Patient tolerated treatment well   Patient Left in bed;with call bell/phone within reach;with bed alarm set   Nurse Communication          Time: 1856-3149 OT Time Calculation (min): 24 min  Charges: OT General Charges $OT Visit: 1 Visit OT Treatments $Self Care/Home Management : 8-22 mins $Therapeutic Activity: 8-22 mins     Britt Bottom 02/01/2021, 1:49 PM

## 2021-02-01 NOTE — Progress Notes (Signed)
[facility]  PROGRESS NOTE  [name] DUK:[id number] DOB: 05-28-1951 DOA: 01/03/2021 PCP: [name], MD   Brief HPI:   70 year old male with past medical history of hypertension, hyperlipidemia, CAD who was admitted with ST relation MI and cardiac arrest prior to coming to hospital.  He underwent emergent cardiac catheterization and PCI with stent to proximal RCA.  Ejection fraction was noted to be 25 to 30%.  Patient did have prolonged ICU stay for cardiogenic/hemorrhagic shock requiring vasopressors and transfusion.  He suffered from diminished mental status and MRI showed scattered punctate infarction concerning for anoxic injury.  Also had acute kidney injury and required CRRT and was subsequently transitioned to intermittent hemodialysis.  Patient was extubated on 01/15/2021 and reintubated on 01/20/2021.  Subsequently was extubated on 01/23/2021 and was transferred out of ICU.  Patient's mental status has significantly improved.  Palliative care was consulted and healthcare power of attorney decided not to escalate care and made him DNR.  Nephrology and cardiology have been following the patient.  Significant Events: 4/20 Impella(heart pump) removed. MRI brain with numerous micro embolic infarctions. Off pressors.  4/21 lasix drip stopped.  4/22 started CVVH 4/24 CVVH stopped. Failed extubation. Re-intubated.  4/26 Extubated  5/1 Re-intubated 5/4 Extubated, off pressors    Subjective   Patient seen and examined, pulled out core track tube last night.  Ate breakfast this morning, says it was better than yesterday.  Palliative care had discussion with [facility] POA yesterday, and they decided not to pursue with PEG tube at this time.   Assessment/Plan:     1. CAD/acute inferior STEMI-patient underwent emergent cardiac catheterization with PCI and drug-eluting stent to right coronary artery.  Continue DAPT, statin.  Patient not ARB due to acute kidney injury.  Continue hydralazine  and nitrates. 2. Acute systolic heart failure/cardiogenic shock-patient's EF was found to be 25 to 30%, he underwent CRRT.  Currently hemodialysis has been stopped.  Patient slowly improving.  Continue hydralazine, isosorbide, spironolactone. 3. S/p ventricular fibrillation arrest-s/p anoxic brain injury.  Cognition has improved.  Patient current status is DNR. 4. Fever with leukocytosis-likely secondary to pseudomonal UTI.  Hemodialysis catheter was removed including Foley catheter.  UA showed some pyuria.  Urine culture grew Pseudomonas.  Patient was initially started on Rocephin which was changed to ceftazidime.  Hemodialysis catheter tip culture showed 3000 colonies of staph epidermidis.  Likely contamination.  No need to treat as patient is improving.  Blood cultures have been negative in 4 days.  Chest x-ray showed minimal bibasilar atelectasis.  Fever has resolved.  WBC is down to 14,000.  5. Nonoliguric AKI-likely from ATN and cardiogenic shock.  Creatinine has improved to 1.82.  No further hemodialysis plan per nephrology.  HD catheter has been removed. 6. Anoxic brain injury-MRI brain showed microembolic infarcts and EEG showed triphasic morphology with diffuse encephalopathy.  Patient has alert and communicating at this time.  Significantly improved.  Continue Provigil. 7. Hypernatremia-receiving free water through core track feeding tube.  Sodium level has improved to 139. 8. Nutrition-core track tube was inserted, unfortunately patient pulled out core track tube last night.  Palliative care had discussion with patient's [facility] POA who did not want PEG tube .plan was to continue core track tube feeding as long as possible.  As patient has pulled out core track feeding tube, and is eating well.  Will monitor without feeding tube and see if his p.o. intake improves.  Diet has been changed to regular as patient has  been complaining of food not good in the hospital.      Scheduled medications:   .  aspirin  81 mg Per Tube Daily  . chlorhexidine gluconate (MEDLINE KIT)  15 mL Mouth Rinse BID  . Chlorhexidine Gluconate Cloth  6 each Topical Daily  . enoxaparin (LOVENOX) injection  40 mg Subcutaneous Q24H  . feeding supplement  1 Container Oral TID BM  . feeding supplement (PROSource TF)  90 mL Per Tube BID  . fiber  1 packet Oral BID  . hydrALAZINE  25 mg Per Tube Q8H  . insulin aspart  0-9 Units Subcutaneous TID AC & HS  . isosorbide dinitrate  10 mg Per Tube TID  . mouth rinse  15 mL Mouth Rinse q12n4p  . modafinil  100 mg Per Tube Daily  . pantoprazole sodium  40 mg Per Tube Daily  . rosuvastatin  10 mg Per Tube Daily  . spironolactone  12.5 mg Oral Daily  . ticagrelor  90 mg Per Tube BID         Data Reviewed:   CBG:  Recent Labs  Lab 01/31/21 0603 01/31/21 1109 01/31/21 1558 01/31/21 2109 02/01/21 0608  GLUCAP 113* 94 98 95 86    SpO2: 98 % O2 Flow Rate (L/min): 2 L/min FiO2 (%): 30 %    Vitals:   02/01/21 0600 02/01/21 0700 02/01/21 0800 02/01/21 0824  BP:   140/75   Pulse:    83  Resp: 14 (!) 24 (!) 25   Temp:    98.8 F (37.1 C)  TempSrc:    Oral  SpO2:    98%  Weight:      Height:         Intake/Output Summary (Last 24 hours) at 02/01/2021 1006 Last data filed at 02/01/2021 0825 Gross per 24 hour  Intake 1775 ml  Output 2500 ml  Net -725 ml    05/11 1901 - 05/13 0700 In: 2288 [P.O.:1400] Out: 2950 [Urine:2950]  Filed Weights   01/30/21 0332 01/31/21 0341 02/01/21 0109  Weight: 83.6 kg 82.6 kg 82.3 kg    CBC:  Recent Labs  Lab 01/27/21 0924 01/29/21 0334 01/30/21 0303 01/31/21 0316  WBC 12.4* 15.2* 16.4* 14.6*  HGB 8.6* 8.1* 8.2* 7.4*  HCT 26.6* 25.1* 25.9* 23.0*  PLT 285 271 231 212  MCV 102.7* 97.3 94.9 96.2  MCH 33.2 31.4 30.0 31.0  MCHC 32.3 32.3 31.7 32.2  RDW 17.6* 16.5* 16.7* 17.1*    Complete metabolic panel:  Recent Labs  Lab 01/26/21 0302 01/27/21 0202 01/27/21 0924 01/28/21 0700 01/29/21 0334  01/30/21 0303 01/31/21 0316 02/01/21 0325  NA 150*  --  145  --  140 139 136 136  K 3.6  --  3.7  --  3.6 3.8 3.6 4.2  CL 112*  --  113*  --  108 109 109 112*  CO2 24  --  23  --  21* 20* 18* 16*  GLUCOSE 153*  --  142*  --  126* 119* 110* 97  BUN 120*  --  100*  --  76* 64* 57* 50*  CREATININE 3.00*  --  2.41*  --  2.13* 1.96* 1.96* 1.82*  CALCIUM 8.5*  --  8.1*  --  8.2* 8.3* 8.1* 8.2*  AST 80*  --  122*  --   --   --  26  --   ALT 127*  --  203*  --   --   --  83*  --   ALKPHOS 135*  --  119  --   --   --  81  --   BILITOT 0.5  --  0.6  --   --   --  0.4  --   ALBUMIN 2.4*  --  2.4*  --  2.3*  --  2.2*  --   MG 2.5*   < >  --  2.0 2.0 1.9 1.8 1.9   < > = values in this interval not displayed.    No results for input(s): LIPASE, AMYLASE in the last 168 hours.  No results for input(s): CRP, DDIMER, BNP, PROCALCITON, SARSCOV2NAA in the last 168 hours.  Invalid input(s): LACTICACID  ------------------------------------------------------------------------------------------------------------------ No results for input(s): CHOL, HDL, LDLCALC, TRIG, CHOLHDL, LDLDIRECT in the last 72 hours.  Lab Results  Component Value Date   HGBA1C 5.7 (H) 01/06/2021   ------------------------------------------------------------------------------------------------------------------ No results for input(s): TSH, T4TOTAL, T3FREE, THYROIDAB in the last 72 hours.  Invalid input(s): FREET3 ------------------------------------------------------------------------------------------------------------------ No results for input(s): VITAMINB12, FOLATE, FERRITIN, TIBC, IRON, RETICCTPCT in the last 72 hours.  Coagulation profile No results for input(s): INR, PROTIME in the last 168 hours. No results for input(s): DDIMER in the last 72 hours.  Cardiac Enzymes No results for input(s): CKTOTAL, CKMB, CKMBINDEX, TROPONINI in the last 168  hours.  ------------------------------------------------------------------------------------------------------------------ No results found for: BNP   Antibiotics: Anti-infectives (From admission, onward)   Start     Dose/Rate Route Frequency Ordered Stop   01/27/21 2200  cefTAZidime (FORTAZ) 2 g in sodium chloride 0.9 % 100 mL IVPB        2 g 200 mL/hr over 30 Minutes Intravenous Every 12 hours 01/27/21 1403     01/27/21 1500  cefTAZidime (FORTAZ) 2 g in sodium chloride 0.9 % 100 mL IVPB  Status:  Discontinued        2 g 200 mL/hr over 30 Minutes Intravenous Every 12 hours 01/27/21 1402 01/27/21 1403   01/25/21 2200  cefTRIAXone (ROCEPHIN) 1 g in sodium chloride 0.9 % 100 mL IVPB  Status:  Discontinued       Note to Pharmacy: Dose was documented as given earlier this evening but IV became disconnected at some point and RN thinks he may not have received the dose   1 g 200 mL/hr over 30 Minutes Intravenous Every 24 hours 01/25/21 2107 01/27/21 1402   01/25/21 1830  cefTRIAXone (ROCEPHIN) 1 g in sodium chloride 0.9 % 100 mL IVPB  Status:  Discontinued        1 g 200 mL/hr over 30 Minutes Intravenous Every 24 hours 01/25/21 1740 01/25/21 2107   01/13/21 1400  piperacillin-tazobactam (ZOSYN) IVPB 3.375 g  Status:  Discontinued        3.375 g 12.5 mL/hr over 240 Minutes Intravenous Every 8 hours 01/13/21 1030 01/15/21 0813   01/13/21 1200  piperacillin-tazobactam (ZOSYN) IVPB 3.375 g  Status:  Discontinued        3.375 g 100 mL/hr over 30 Minutes Intravenous Every 6 hours 01/13/21 0955 01/13/21 1030   01/13/21 1045  ceFEPIme (MAXIPIME) 2 g in sodium chloride 0.9 % 100 mL IVPB  Status:  Discontinued        2 g 200 mL/hr over 30 Minutes Intravenous Every 12 hours 01/13/21 0949 01/13/21 0954   01/08/21 1100  cefTRIAXone (ROCEPHIN) 2 g in sodium chloride 0.9 % 100 mL IVPB  Status:  Discontinued        2 g 200 mL/hr over 30  Minutes Intravenous Every 24 hours 01/07/21 1244 01/10/21 1012    01/08/21 0600  vancomycin (VANCOREADY) IVPB 1250 mg/250 mL  Status:  Discontinued        1,250 mg 166.7 mL/hr over 90 Minutes Intravenous Every 48 hours 01/06/21 2202 01/08/21 1010   01/07/21 1108  ceFEPIme (MAXIPIME) 2 g in sodium chloride 0.9 % 100 mL IVPB  Status:  Discontinued        2 g 200 mL/hr over 30 Minutes Intravenous Every 24 hours 01/06/21 2202 01/07/21 1244   01/06/21 1200  vancomycin (VANCOREADY) IVPB 1000 mg/200 mL  Status:  Discontinued        1,000 mg 200 mL/hr over 60 Minutes Intravenous Every 24 hours 01/05/21 2209 01/06/21 2202   01/06/21 1200  ceFEPIme (MAXIPIME) 2 g in sodium chloride 0.9 % 100 mL IVPB  Status:  Discontinued        2 g 200 mL/hr over 30 Minutes Intravenous Every 12 hours 01/05/21 2209 01/06/21 2202   01/04/21 1400  ceFEPIme (MAXIPIME) 2 g in sodium chloride 0.9 % 100 mL IVPB        2 g 200 mL/hr over 30 Minutes Intravenous Every 8 hours 01/04/21 1335 01/05/21 2231   01/04/21 1000  ceFEPIme (MAXIPIME) 2 g in sodium chloride 0.9 % 100 mL IVPB  Status:  Discontinued        2 g 200 mL/hr over 30 Minutes Intravenous Every 12 hours 01/03/21 2131 01/04/21 1335   01/04/21 0600  vancomycin (VANCOREADY) IVPB 750 mg/150 mL  Status:  Discontinued        750 mg 150 mL/hr over 60 Minutes Intravenous Every 12 hours 01/03/21 1436 01/05/21 2209   01/03/21 2200  vancomycin (VANCOREADY) IVPB 750 mg/150 mL  Status:  Discontinued        750 mg 150 mL/hr over 60 Minutes Intravenous Every 12 hours 01/03/21 1241 01/03/21 1436   01/03/21 1400  ceFEPIme (MAXIPIME) 2 g in sodium chloride 0.9 % 100 mL IVPB  Status:  Discontinued        2 g 200 mL/hr over 30 Minutes Intravenous Every 8 hours 01/03/21 1238 01/03/21 2131   01/03/21 1330  vancomycin (VANCOREADY) IVPB 1250 mg/250 mL        1,250 mg 166.7 mL/hr over 90 Minutes Intravenous  Once 01/03/21 1241 01/03/21 1640       Radiology Reports  No results found.    DVT prophylaxis: Lovenox  Code Status: Full  code  Family Communication: No family at bedside   Consultants:  PCCM  Cardiology  Nephrology  Procedures:  Intubation  Cardiac catheterization  CRRT    Objective    Physical Examination:    General-appears in no acute distress  Heart-S1-S2, regular, no murmur auscultated  Lungs-clear to auscultation bilaterally, no wheezing or crackles auscultated  Abdomen-soft, nontender, no organomegaly  Extremities-no edema in the lower extremities  Neuro-alert, oriented x3, no focal deficit noted   Status is: Inpatient  Dispo: The patient is from: Home              Anticipated d/c is to: Skilled nursing facility versus inpatient rehab              Anticipated d/c date is: 02/05/2021              Patient currently  stable for discharge  Barrier to discharge-awaiting rbed at skilled nursing facility  COVID-19 Labs  No results for input(s): DDIMER, FERRITIN, LDH, CRP in the last 72  hours.  Lab Results  Component Value Date   [place] NEGATIVE 01/03/2021    Microbiology  Recent Results (from the past 240 hour(s))  Culture, Urine     Status: Abnormal   Collection Time: 01/25/21  8:48 AM   Specimen: Urine, Catheterized  Result Value Ref Range Status   Specimen Description URINE, CATHETERIZED  Final   Special Requests   Final    NONE Performed at [facility], [address] [place], [place] [address]    Culture 80,000 COLONIES/mL PSEUDOMONAS AERUGINOSA (A)  Final   Report Status 01/27/2021 FINAL  Final   Organism ID, Bacteria PSEUDOMONAS AERUGINOSA (A)  Final      Susceptibility   Pseudomonas aeruginosa - MIC*    CEFTAZIDIME 4 SENSITIVE Sensitive     CIPROFLOXACIN <=0.25 SENSITIVE Sensitive     GENTAMICIN <=1 SENSITIVE Sensitive     IMIPENEM 2 SENSITIVE Sensitive     PIP/TAZO 16 SENSITIVE Sensitive     CEFEPIME 2 SENSITIVE Sensitive     * 80,000 COLONIES/mL PSEUDOMONAS AERUGINOSA  Culture, blood (routine x 2)     Status: None   Collection  Time: 01/25/21 10:53 AM   Specimen: BLOOD LEFT FOREARM  Result Value Ref Range Status   Specimen Description BLOOD LEFT FOREARM  Final   Special Requests   Final    BOTTLES DRAWN AEROBIC ONLY Blood Culture results may not be optimal due to an excessive volume of blood received in culture bottles   Culture   Final    NO GROWTH 5 DAYS Performed at [facility], [place] [address], [place], [place] [address]    Report Status 01/30/2021 FINAL  Final  Culture, blood (routine x 2)     Status: None   Collection Time: 01/25/21 10:53 AM   Specimen: BLOOD RIGHT FOREARM  Result Value Ref Range Status   Specimen Description BLOOD RIGHT FOREARM  Final   Special Requests   Final    BOTTLES DRAWN AEROBIC ONLY Blood Culture adequate volume   Culture   Final    NO GROWTH 5 DAYS Performed at [name], [place] [address], [place], [place] [address]    Report Status 01/30/2021 FINAL  Final  Cath Tip Culture     Status: Abnormal   Collection Time: 01/25/21  8:00 PM   Specimen: Catheter Tip; Other  Result Value Ref Range Status   Specimen Description CATH TIP  Final   Special Requests   Final    NONE Performed at [facility], [place] [address], [place], [place] [address]    Culture 3,000 COLONIES/mL STAPHYLOCOCCUS EPIDERMIDIS (A)  Final   Report Status 01/30/2021 FINAL  Final   Organism ID, Bacteria STAPHYLOCOCCUS EPIDERMIDIS (A)  Final      Susceptibility   Staphylococcus epidermidis - MIC*    CIPROFLOXACIN <=0.5 SENSITIVE Sensitive     ERYTHROMYCIN <=0.25 SENSITIVE Sensitive     GENTAMICIN <=0.5 SENSITIVE Sensitive     OXACILLIN >=4 RESISTANT Resistant     TETRACYCLINE 2 SENSITIVE Sensitive     VANCOMYCIN 1 SENSITIVE Sensitive     TRIMETH/SULFA <=10 SENSITIVE Sensitive     CLINDAMYCIN <=0.25 SENSITIVE Sensitive     RIFAMPIN <=0.5 SENSITIVE Sensitive     Inducible Clindamycin NEGATIVE Sensitive     * 3,000 COLONIES/mL STAPHYLOCOCCUS EPIDERMIDIS    Pressure Injury 01/20/21  Heel Left Deep Tissue Pressure Injury - Purple or maroon localized area of discolored intact skin or blood-filled blister  due to damage of underlying soft tissue from pressure and/or shear. (Active)  01/20/21 0100  Location: Heel  Location Orientation: Left  Staging: Deep Tissue Pressure Injury - Purple or maroon localized area of discolored intact skin or blood-filled blister due to damage of underlying soft tissue from pressure and/or shear.  Wound Description (Comments):   Present on Admission: No       [facility] If 7PM-7AM, please contact night-coverage at [email or web address], Office  [phone]   02/01/2021, 10:06 AM  LOS: 29 days

## 2021-02-02 DIAGNOSIS — G9340 Encephalopathy, unspecified: Secondary | ICD-10-CM | POA: Diagnosis not present

## 2021-02-02 DIAGNOSIS — J9601 Acute respiratory failure with hypoxia: Secondary | ICD-10-CM | POA: Diagnosis not present

## 2021-02-02 DIAGNOSIS — R570 Cardiogenic shock: Secondary | ICD-10-CM | POA: Diagnosis not present

## 2021-02-02 DIAGNOSIS — N170 Acute kidney failure with tubular necrosis: Secondary | ICD-10-CM | POA: Diagnosis not present

## 2021-02-02 LAB — GLUCOSE, CAPILLARY
Glucose-Capillary: 103 mg/dL — ABNORMAL HIGH (ref 70–99)
Glucose-Capillary: 110 mg/dL — ABNORMAL HIGH (ref 70–99)
Glucose-Capillary: 130 mg/dL — ABNORMAL HIGH (ref 70–99)
Glucose-Capillary: 138 mg/dL — ABNORMAL HIGH (ref 70–99)

## 2021-02-02 LAB — BASIC METABOLIC PANEL
Anion gap: 10 (ref 5–15)
BUN: 41 mg/dL — ABNORMAL HIGH (ref 8–23)
CO2: 17 mmol/L — ABNORMAL LOW (ref 22–32)
Calcium: 8.5 mg/dL — ABNORMAL LOW (ref 8.9–10.3)
Chloride: 109 mmol/L (ref 98–111)
Creatinine, Ser: 1.9 mg/dL — ABNORMAL HIGH (ref 0.61–1.24)
GFR, Estimated: 37 mL/min — ABNORMAL LOW (ref >60)
Glucose, Bld: 103 mg/dL — ABNORMAL HIGH (ref 70–99)
Potassium: 3.9 mmol/L (ref 3.5–5.1)
Sodium: 136 mmol/L (ref 135–145)

## 2021-02-02 LAB — MAGNESIUM: Magnesium: 1.7 mg/dL (ref 1.7–2.4)

## 2021-02-02 MED ORDER — MODAFINIL 100 MG PO TABS
100.0000 mg | ORAL_TABLET | Freq: Every day | ORAL | Status: DC
  Administered 2021-02-02 – 2021-02-07 (×6): 100 mg via ORAL
  Filled 2021-02-02 (×5): qty 1

## 2021-02-02 MED ORDER — PANTOPRAZOLE SODIUM 40 MG PO PACK
40.0000 mg | PACK | Freq: Every day | ORAL | Status: DC
  Administered 2021-02-02 – 2021-02-07 (×6): 40 mg via ORAL
  Filled 2021-02-02 (×6): qty 20

## 2021-02-02 MED ORDER — ISOSORBIDE DINITRATE 10 MG PO TABS
10.0000 mg | ORAL_TABLET | Freq: Three times a day (TID) | ORAL | Status: DC
  Administered 2021-02-02 – 2021-02-06 (×13): 10 mg via ORAL
  Filled 2021-02-02 (×13): qty 1

## 2021-02-02 MED ORDER — SODIUM BICARBONATE 650 MG PO TABS
650.0000 mg | ORAL_TABLET | Freq: Two times a day (BID) | ORAL | Status: DC
  Administered 2021-02-02 – 2021-02-07 (×10): 650 mg via ORAL
  Filled 2021-02-02 (×10): qty 1

## 2021-02-02 MED ORDER — HYDRALAZINE HCL 25 MG PO TABS
25.0000 mg | ORAL_TABLET | Freq: Three times a day (TID) | ORAL | Status: DC
  Administered 2021-02-02 – 2021-02-07 (×15): 25 mg via ORAL
  Filled 2021-02-02 (×16): qty 1

## 2021-02-02 MED ORDER — ASPIRIN 81 MG PO CHEW
81.0000 mg | CHEWABLE_TABLET | Freq: Every day | ORAL | Status: DC
  Administered 2021-02-02 – 2021-02-07 (×6): 81 mg via ORAL
  Filled 2021-02-02 (×5): qty 1

## 2021-02-02 MED ORDER — ROSUVASTATIN CALCIUM 5 MG PO TABS
10.0000 mg | ORAL_TABLET | Freq: Every day | ORAL | Status: DC
  Administered 2021-02-02 – 2021-02-07 (×6): 10 mg via ORAL
  Filled 2021-02-02 (×5): qty 2

## 2021-02-02 MED ORDER — MAGNESIUM SULFATE IN D5W 1-5 GM/100ML-% IV SOLN
1.0000 g | Freq: Once | INTRAVENOUS | Status: AC
  Administered 2021-02-02: 1 g via INTRAVENOUS
  Filled 2021-02-02: qty 100

## 2021-02-02 NOTE — Progress Notes (Signed)
[facility]  PROGRESS NOTE  [name] RWE:[id number] DOB: [birth year] DOA: 01/03/2021 PCP: [name], MD   Brief HPI:   70 year old male with past medical history of hypertension, hyperlipidemia, CAD who was admitted with ST relation MI and cardiac arrest prior to coming to hospital.  He underwent emergent cardiac catheterization and PCI with stent to proximal RCA.  Ejection fraction was noted to be 25 to 30%.  Patient did have prolonged ICU stay for cardiogenic/hemorrhagic shock requiring vasopressors and transfusion.  He suffered from diminished mental status and MRI showed scattered punctate infarction concerning for anoxic injury.  Also had acute kidney injury and required CRRT and was subsequently transitioned to intermittent hemodialysis.  Patient was extubated on 01/15/2021 and reintubated on 01/20/2021.  Subsequently was extubated on 01/23/2021 and was transferred out of ICU.  Patient's mental status has significantly improved.  Palliative care was consulted and healthcare power of attorney decided not to escalate care and made him DNR.  Nephrology and cardiology have been following the patient.  Significant Events: 4/20 Impella(heart pump) removed. MRI brain with numerous micro embolic infarctions. Off pressors.  4/21 lasix drip stopped.  4/22 started CVVH 4/24 CVVH stopped. Failed extubation. Re-intubated.  4/26 Extubated  5/1 Re-intubated 5/4 Extubated, off pressors    Subjective   Patient seen and examined, denies chest pain or shortness of breath.  Coretrak feeding tube is  out.  Patient p.o. intake is improving.   Assessment/Plan:     1. CAD/acute inferior STEMI-patient underwent emergent cardiac catheterization with PCI and drug-eluting stent to right coronary artery.  Continue DAPT, statin.  Patient not ARB due to acute kidney injury.  Continue hydralazine and nitrates. 2. Acute systolic heart failure/cardiogenic shock-patient's EF was found to be 25 to 30%, he  underwent CRRT.  Currently hemodialysis has been stopped.  Patient slowly improving.  Continue hydralazine, isosorbide, spironolactone. 3. S/p ventricular fibrillation arrest-s/p anoxic brain injury.  Cognition has improved.  Patient current status is DNR. 4. Fever with leukocytosis-likely secondary to pseudomonal UTI.  Hemodialysis catheter was removed including Foley catheter.  UA showed some pyuria.  Urine culture grew Pseudomonas.  Patient was initially started on Rocephin which was changed to ceftazidime.  Hemodialysis catheter tip culture showed 3000 colonies of staph epidermidis.  Likely contamination.  No need to treat as patient is improving.  Blood cultures have been negative in 4 days.  Chest x-ray showed minimal bibasilar atelectasis.  Fever has resolved.  WBC is down to 10,000.  5. Nonoliguric AKI-likely from ATN and cardiogenic shock.  Creatinine is stable at 1.90.  No further hemodialysis plan per nephrology.  HD catheter has been removed. 6. Hypomagnesemia-magnesium 1.7.  Will give 1 g of magnesium sulfate x1. 7. Non-anion gap metabolic acidosis-we will start sodium bicarb tablets and follow BMP in am. 8. Anoxic brain injury-MRI brain showed microembolic infarcts and EEG showed triphasic morphology with diffuse encephalopathy.  Patient has alert and communicating at this time.  Significantly improved.  Continue Provigil. 9. Hypernatremia-resolved, sodium is 136 today. 10. Nutrition-core track tube was inserted, unfortunately patient pulled out core track tube last night.  Palliative care had discussion with patient's [facility] POA who did not want PEG tube .plan was to continue core track tube feeding as long as possible.  As patient has pulled out core track feeding tube, and is eating well.  Will monitor without feeding tube and see if his p.o. intake improves.  Diet has been changed to regular as patient has been complaining  of food not good in the hospital.      Scheduled medications:   .  aspirin  81 mg Oral Daily  . chlorhexidine gluconate (MEDLINE KIT)  15 mL Mouth Rinse BID  . enoxaparin (LOVENOX) injection  40 mg Subcutaneous Q24H  . feeding supplement  1 Container Oral TID BM  . hydrALAZINE  25 mg Oral Q8H  . insulin aspart  0-9 Units Subcutaneous TID AC & HS  . isosorbide dinitrate  10 mg Oral TID  . losartan  25 mg Oral Daily  . mouth rinse  15 mL Mouth Rinse q12n4p  . modafinil  100 mg Oral Daily  . pantoprazole sodium  40 mg Oral Daily  . rosuvastatin  10 mg Oral Daily  . spironolactone  12.5 mg Oral Daily  . ticagrelor  90 mg Per Tube BID         Data Reviewed:   CBG:  Recent Labs  Lab 02/01/21 1115 02/01/21 1550 02/01/21 2058 02/02/21 0547 02/02/21 1100  GLUCAP 147* 75 92 103* 110*    SpO2: 99 % O2 Flow Rate (L/min): 2 L/min FiO2 (%): 30 %    Vitals:   02/02/21 0325 02/02/21 0816 02/02/21 1340 02/02/21 1616  BP: 140/74  126/62 131/76  Pulse:  82  82  Resp: 20   19  Temp: 99 F (37.2 C) 98.6 F (37 C)  98.5 F (36.9 C)  TempSrc: Oral Oral  Oral  SpO2: 100% 100%  99%  Weight:      Height:         Intake/Output Summary (Last 24 hours) at 02/02/2021 1620 Last data filed at 02/02/2021 1500 Gross per 24 hour  Intake 600 ml  Output 2700 ml  Net -2100 ml    05/12 1901 - 05/14 0700 In: 6440 [P.O.:1400] Out: 3474 [Urine:3925]  Filed Weights   01/31/21 0341 02/01/21 0109 02/02/21 0255  Weight: 82.6 kg 82.3 kg 81.1 kg    CBC:  Recent Labs  Lab 01/27/21 0924 01/29/21 0334 01/30/21 0303 01/31/21 0316 02/01/21 0935  WBC 12.4* 15.2* 16.4* 14.6* 10.6*  HGB 8.6* 8.1* 8.2* 7.4* 8.1*  HCT 26.6* 25.1* 25.9* 23.0* 24.8*  PLT 285 271 231 212 196  MCV 102.7* 97.3 94.9 96.2 98.0  MCH 33.2 31.4 30.0 31.0 32.0  MCHC 32.3 32.3 31.7 32.2 32.7  RDW 17.6* 16.5* 16.7* 17.1* 17.3*    Complete metabolic panel:  Recent Labs  Lab 01/27/21 0924 01/28/21 0700 01/29/21 0334 01/30/21 0303 01/31/21 0316 02/01/21 0325 02/02/21 0259   NA 145  --  140 139 136 136 136  K 3.7  --  3.6 3.8 3.6 4.2 3.9  CL 113*  --  108 109 109 112* 109  CO2 23  --  21* 20* 18* 16* 17*  GLUCOSE 142*  --  126* 119* 110* 97 103*  BUN 100*  --  76* 64* 57* 50* 41*  CREATININE 2.41*  --  2.13* 1.96* 1.96* 1.82* 1.90*  CALCIUM 8.1*  --  8.2* 8.3* 8.1* 8.2* 8.5*  AST 122*  --   --   --  26  --   --   ALT 203*  --   --   --  83*  --   --   ALKPHOS 119  --   --   --  81  --   --   BILITOT 0.6  --   --   --  0.4  --   --  ALBUMIN 2.4*  --  2.3*  --  2.2*  --   --   MG  --    < > 2.0 1.9 1.8 1.9 1.7   < > = values in this interval not displayed.    No results for input(s): LIPASE, AMYLASE in the last 168 hours.  No results for input(s): CRP, DDIMER, BNP, PROCALCITON, SARSCOV2NAA in the last 168 hours.  Invalid input(s): LACTICACID  ------------------------------------------------------------------------------------------------------------------ No results for input(s): CHOL, HDL, LDLCALC, TRIG, CHOLHDL, LDLDIRECT in the last 72 hours.  Lab Results  Component Value Date   HGBA1C 5.7 (H) 01/06/2021   ------------------------------------------------------------------------------------------------------------------ No results for input(s): TSH, T4TOTAL, T3FREE, THYROIDAB in the last 72 hours.  Invalid input(s): FREET3 ------------------------------------------------------------------------------------------------------------------ Recent Labs    02/01/21 0935  FERRITIN 749*  TIBC 256  IRON 86    Coagulation profile No results for input(s): INR, PROTIME in the last 168 hours. No results for input(s): DDIMER in the last 72 hours.  Cardiac Enzymes No results for input(s): CKTOTAL, CKMB, CKMBINDEX, TROPONINI in the last 168 hours.  ------------------------------------------------------------------------------------------------------------------ No results found for: BNP   Antibiotics: Anti-infectives (From admission, onward)    Start     Dose/Rate Route Frequency Ordered Stop   01/27/21 2200  cefTAZidime (FORTAZ) 2 g in sodium chloride 0.9 % 100 mL IVPB        2 g 200 mL/hr over 30 Minutes Intravenous Every 12 hours 01/27/21 1403 02/04/21 0959   01/27/21 1500  cefTAZidime (FORTAZ) 2 g in sodium chloride 0.9 % 100 mL IVPB  Status:  Discontinued        2 g 200 mL/hr over 30 Minutes Intravenous Every 12 hours 01/27/21 1402 01/27/21 1403   01/25/21 2200  cefTRIAXone (ROCEPHIN) 1 g in sodium chloride 0.9 % 100 mL IVPB  Status:  Discontinued       Note to Pharmacy: Dose was documented as given earlier this evening but IV became disconnected at some point and RN thinks he may not have received the dose   1 g 200 mL/hr over 30 Minutes Intravenous Every 24 hours 01/25/21 2107 01/27/21 1402   01/25/21 1830  cefTRIAXone (ROCEPHIN) 1 g in sodium chloride 0.9 % 100 mL IVPB  Status:  Discontinued        1 g 200 mL/hr over 30 Minutes Intravenous Every 24 hours 01/25/21 1740 01/25/21 2107   01/13/21 1400  piperacillin-tazobactam (ZOSYN) IVPB 3.375 g  Status:  Discontinued        3.375 g 12.5 mL/hr over 240 Minutes Intravenous Every 8 hours 01/13/21 1030 01/15/21 0813   01/13/21 1200  piperacillin-tazobactam (ZOSYN) IVPB 3.375 g  Status:  Discontinued        3.375 g 100 mL/hr over 30 Minutes Intravenous Every 6 hours 01/13/21 0955 01/13/21 1030   01/13/21 1045  ceFEPIme (MAXIPIME) 2 g in sodium chloride 0.9 % 100 mL IVPB  Status:  Discontinued        2 g 200 mL/hr over 30 Minutes Intravenous Every 12 hours 01/13/21 0949 01/13/21 0954   01/08/21 1100  cefTRIAXone (ROCEPHIN) 2 g in sodium chloride 0.9 % 100 mL IVPB  Status:  Discontinued        2 g 200 mL/hr over 30 Minutes Intravenous Every 24 hours 01/07/21 1244 01/10/21 1012   01/08/21 0600  vancomycin (VANCOREADY) IVPB 1250 mg/250 mL  Status:  Discontinued        1,250 mg 166.7 mL/hr over 90 Minutes Intravenous Every 48 hours 01/06/21 2202  01/08/21 1010   01/07/21 1108   ceFEPIme (MAXIPIME) 2 g in sodium chloride 0.9 % 100 mL IVPB  Status:  Discontinued        2 g 200 mL/hr over 30 Minutes Intravenous Every 24 hours 01/06/21 2202 01/07/21 1244   01/06/21 1200  vancomycin (VANCOREADY) IVPB 1000 mg/200 mL  Status:  Discontinued        1,000 mg 200 mL/hr over 60 Minutes Intravenous Every 24 hours 01/05/21 2209 01/06/21 2202   01/06/21 1200  ceFEPIme (MAXIPIME) 2 g in sodium chloride 0.9 % 100 mL IVPB  Status:  Discontinued        2 g 200 mL/hr over 30 Minutes Intravenous Every 12 hours 01/05/21 2209 01/06/21 2202   01/04/21 1400  ceFEPIme (MAXIPIME) 2 g in sodium chloride 0.9 % 100 mL IVPB        2 g 200 mL/hr over 30 Minutes Intravenous Every 8 hours 01/04/21 1335 01/05/21 2231   01/04/21 1000  ceFEPIme (MAXIPIME) 2 g in sodium chloride 0.9 % 100 mL IVPB  Status:  Discontinued        2 g 200 mL/hr over 30 Minutes Intravenous Every 12 hours 01/03/21 2131 01/04/21 1335   01/04/21 0600  vancomycin (VANCOREADY) IVPB 750 mg/150 mL  Status:  Discontinued        750 mg 150 mL/hr over 60 Minutes Intravenous Every 12 hours 01/03/21 1436 01/05/21 2209   01/03/21 2200  vancomycin (VANCOREADY) IVPB 750 mg/150 mL  Status:  Discontinued        750 mg 150 mL/hr over 60 Minutes Intravenous Every 12 hours 01/03/21 1241 01/03/21 1436   01/03/21 1400  ceFEPIme (MAXIPIME) 2 g in sodium chloride 0.9 % 100 mL IVPB  Status:  Discontinued        2 g 200 mL/hr over 30 Minutes Intravenous Every 8 hours 01/03/21 1238 01/03/21 2131   01/03/21 1330  vancomycin (VANCOREADY) IVPB 1250 mg/250 mL        1,250 mg 166.7 mL/hr over 90 Minutes Intravenous  Once 01/03/21 1241 01/03/21 1640       Radiology Reports  No results found.    DVT prophylaxis: Lovenox  Code Status: Full code  Family Communication: No family at bedside   Consultants:  PCCM  Cardiology  Nephrology  Procedures:  Intubation  Cardiac catheterization  CRRT    Objective    Physical  Examination:    General-appears in no acute distress  Heart-S1-S2, regular, no murmur auscultated  Lungs-clear to auscultation bilaterally, no wheezing or crackles auscultated  Abdomen-soft, nontender, no organomegaly  Extremities-no edema in the lower extremities  Neuro-alert, oriented x3, no focal deficit noted   Status is: Inpatient  Dispo: The patient is from: Home              Anticipated d/c is to: Skilled nursing facility versus inpatient rehab              Anticipated d/c date is: 02/05/2021              Patient currently  stable for discharge  Barrier to discharge-awaiting bed at skilled nursing facility  [place]    02/01/21 0935  FERRITIN 749*    Lab Results  Component Value Date   [place] NEGATIVE 01/03/2021    Microbiology  Recent Results (from the past 240 hour(s))  Culture, Urine     Status: Abnormal   Collection Time: 01/25/21  8:48 AM   Specimen:  Urine, Catheterized  Result Value Ref Range Status   Specimen Description URINE, CATHETERIZED  Final   Special Requests   Final    NONE Performed at [facility], [address], [place], [place] [address]    Culture 80,000 COLONIES/mL PSEUDOMONAS AERUGINOSA (A)  Final   Report Status 01/27/2021 FINAL  Final   Organism ID, Bacteria PSEUDOMONAS AERUGINOSA (A)  Final      Susceptibility   Pseudomonas aeruginosa - MIC*    CEFTAZIDIME 4 SENSITIVE Sensitive     CIPROFLOXACIN <=0.25 SENSITIVE Sensitive     GENTAMICIN <=1 SENSITIVE Sensitive     IMIPENEM 2 SENSITIVE Sensitive     PIP/TAZO 16 SENSITIVE Sensitive     CEFEPIME 2 SENSITIVE Sensitive     * 80,000 COLONIES/mL PSEUDOMONAS AERUGINOSA  Culture, blood (routine x 2)     Status: None   Collection Time: 01/25/21 10:53 AM   Specimen: BLOOD LEFT FOREARM  Result Value Ref Range Status   Specimen Description BLOOD LEFT FOREARM  Final   Special Requests   Final    BOTTLES DRAWN AEROBIC ONLY Blood Culture results may not  be optimal due to an excessive volume of blood received in culture bottles   Culture   Final    NO GROWTH 5 DAYS Performed at [facility], [place] [address], [place], [place] [address]    Report Status 01/30/2021 FINAL  Final  Culture, blood (routine x 2)     Status: None   Collection Time: 01/25/21 10:53 AM   Specimen: BLOOD RIGHT FOREARM  Result Value Ref Range Status   Specimen Description BLOOD RIGHT FOREARM  Final   Special Requests   Final    BOTTLES DRAWN AEROBIC ONLY Blood Culture adequate volume   Culture   Final    NO GROWTH 5 DAYS Performed at [facility], [place] [address] [place], [place] [address]    Report Status 01/30/2021 FINAL  Final  Cath Tip Culture     Status: Abnormal   Collection Time: 01/25/21  8:00 PM   Specimen: Catheter Tip; Other  Result Value Ref Range Status   Specimen Description CATH TIP  Final   Special Requests   Final    NONE Performed at [facility], [place] [address], [place], [place] [address]    Culture 3,000 COLONIES/mL STAPHYLOCOCCUS EPIDERMIDIS (A)  Final   Report Status 01/30/2021 FINAL  Final   Organism ID, Bacteria STAPHYLOCOCCUS EPIDERMIDIS (A)  Final      Susceptibility   Staphylococcus epidermidis - MIC*    CIPROFLOXACIN <=0.5 SENSITIVE Sensitive     ERYTHROMYCIN <=0.25 SENSITIVE Sensitive     GENTAMICIN <=0.5 SENSITIVE Sensitive     OXACILLIN >=4 RESISTANT Resistant     TETRACYCLINE 2 SENSITIVE Sensitive     VANCOMYCIN 1 SENSITIVE Sensitive     TRIMETH/SULFA <=10 SENSITIVE Sensitive     CLINDAMYCIN <=0.25 SENSITIVE Sensitive     RIFAMPIN <=0.5 SENSITIVE Sensitive     Inducible Clindamycin NEGATIVE Sensitive     * 3,000 COLONIES/mL STAPHYLOCOCCUS EPIDERMIDIS    Pressure Injury 01/20/21 Heel Left Deep Tissue Pressure Injury - Purple or maroon localized area of discolored intact skin or blood-filled blister due to damage of underlying soft tissue from pressure and/or shear. (Active)  01/20/21 0100  Location:  Heel  Location Orientation: Left  Staging: Deep Tissue Pressure Injury - Purple or maroon localized area of discolored intact skin or blood-filled blister due to damage of  underlying soft tissue from pressure and/or shear.  Wound Description (Comments):   Present on Admission: No       [facility] If 7PM-7AM, please contact night-coverage at [email or web address], Office  [phone]   02/02/2021, 4:20 PM  LOS: 30 days

## 2021-02-02 NOTE — Plan of Care (Signed)
  Problem: Education: Goal: Knowledge of General Education information will improve Description: Including pain rating scale, medication(s)/side effects and non-pharmacologic comfort measures Outcome: Progressing   Problem: Health Behavior/Discharge Planning: Goal: Ability to manage health-related needs will improve Outcome: Progressing   Problem: Clinical Measurements: Goal: Ability to maintain clinical measurements within normal limits will improve Outcome: Progressing Goal: Will remain free from infection Outcome: Progressing Goal: Diagnostic test results will improve Outcome: Progressing Goal: Respiratory complications will improve Outcome: Progressing Goal: Cardiovascular complication will be avoided Outcome: Progressing   Problem: Activity: Goal: Risk for activity intolerance will decrease Outcome: Progressing   Problem: Nutrition: Goal: Adequate nutrition will be maintained Outcome: Progressing   Problem: Coping: Goal: Level of anxiety will decrease Outcome: Progressing   Problem: Elimination: Goal: Will not experience complications related to bowel motility Outcome: Progressing   Problem: Safety: Goal: Ability to remain free from injury will improve Outcome: Progressing   Problem: Skin Integrity: Goal: Risk for impaired skin integrity will decrease Outcome: Progressing   Problem: Education: Goal: Understanding of cardiac disease, CV risk reduction, and recovery process will improve Outcome: Progressing Goal: Understanding of medication regimen will improve Outcome: Progressing Goal: Individualized Educational Video(s) Outcome: Progressing   Problem: Activity: Goal: Ability to tolerate increased activity will improve Outcome: Progressing   Problem: Cardiac: Goal: Ability to achieve and maintain adequate cardiopulmonary perfusion will improve Outcome: Progressing   Problem: Health Behavior/Discharge Planning: Goal: Ability to safely manage  health-related needs after discharge will improve Outcome: Progressing   Problem: Activity: Goal: Ability to tolerate increased activity will improve Outcome: Progressing   Problem: Respiratory: Goal: Ability to maintain a clear airway and adequate ventilation will improve Outcome: Progressing   Problem: Education: Goal: Ability to demonstrate management of disease process will improve Outcome: Progressing Goal: Ability to verbalize understanding of medication therapies will improve Outcome: Progressing   Problem: Activity: Goal: Capacity to carry out activities will improve Outcome: Progressing   Problem: Cardiac: Goal: Ability to achieve and maintain adequate cardiopulmonary perfusion will improve Outcome: Progressing   

## 2021-02-02 NOTE — Plan of Care (Signed)
Problem: Education: Goal: Knowledge of General Education information will improve Description: Including pain rating scale, medication(s)/side effects and non-pharmacologic comfort measures 02/02/2021 1900 by Rolm Baptise, RN Outcome: Progressing 02/02/2021 1828 by Rolm Baptise, RN Outcome: Progressing   Problem: Health Behavior/Discharge Planning: Goal: Ability to manage health-related needs will improve 02/02/2021 1900 by Rolm Baptise, RN Outcome: Progressing 02/02/2021 1828 by Karyl Kinnier D, RN Outcome: Progressing   Problem: Clinical Measurements: Goal: Ability to maintain clinical measurements within normal limits will improve 02/02/2021 1900 by Rolm Baptise, RN Outcome: Progressing 02/02/2021 1828 by Karyl Kinnier D, RN Outcome: Progressing Goal: Will remain free from infection 02/02/2021 1900 by Karyl Kinnier D, RN Outcome: Progressing 02/02/2021 1828 by Karyl Kinnier D, RN Outcome: Progressing Goal: Diagnostic test results will improve 02/02/2021 1900 by Rolm Baptise, RN Outcome: Progressing 02/02/2021 1828 by Karyl Kinnier D, RN Outcome: Progressing Goal: Respiratory complications will improve 02/02/2021 1900 by Rolm Baptise, RN Outcome: Progressing 02/02/2021 1828 by Karyl Kinnier D, RN Outcome: Progressing Goal: Cardiovascular complication will be avoided 02/02/2021 1900 by Rolm Baptise, RN Outcome: Progressing 02/02/2021 1828 by Rolm Baptise, RN Outcome: Progressing   Problem: Activity: Goal: Risk for activity intolerance will decrease 02/02/2021 1900 by Rolm Baptise, RN Outcome: Progressing 02/02/2021 1828 by Rolm Baptise, RN Outcome: Progressing   Problem: Nutrition: Goal: Adequate nutrition will be maintained 02/02/2021 1900 by Rolm Baptise, RN Outcome: Progressing 02/02/2021 1828 by Karyl Kinnier D, RN Outcome: Progressing   Problem: Coping: Goal: Level of anxiety will decrease 02/02/2021 1900 by Rolm Baptise, RN Outcome:  Progressing 02/02/2021 1828 by Karyl Kinnier D, RN Outcome: Progressing   Problem: Elimination: Goal: Will not experience complications related to bowel motility 02/02/2021 1900 by Rolm Baptise, RN Outcome: Progressing 02/02/2021 1828 by Karyl Kinnier D, RN Outcome: Progressing   Problem: Safety: Goal: Ability to remain free from injury will improve 02/02/2021 1900 by Rolm Baptise, RN Outcome: Progressing 02/02/2021 1828 by Karyl Kinnier D, RN Outcome: Progressing   Problem: Skin Integrity: Goal: Risk for impaired skin integrity will decrease 02/02/2021 1900 by Rolm Baptise, RN Outcome: Progressing 02/02/2021 1828 by Karyl Kinnier D, RN Outcome: Progressing   Problem: Education: Goal: Understanding of cardiac disease, CV risk reduction, and recovery process will improve 02/02/2021 1900 by Rolm Baptise, RN Outcome: Progressing 02/02/2021 1828 by Karyl Kinnier D, RN Outcome: Progressing Goal: Understanding of medication regimen will improve 02/02/2021 1900 by Rolm Baptise, RN Outcome: Progressing 02/02/2021 1828 by Rolm Baptise, RN Outcome: Progressing Goal: Individualized Educational Video(s) 02/02/2021 1900 by Rolm Baptise, RN Outcome: Progressing 02/02/2021 1828 by Rolm Baptise, RN Outcome: Progressing   Problem: Activity: Goal: Ability to tolerate increased activity will improve 02/02/2021 1900 by Rolm Baptise, RN Outcome: Progressing 02/02/2021 1828 by Rolm Baptise, RN Outcome: Progressing   Problem: Cardiac: Goal: Ability to achieve and maintain adequate cardiopulmonary perfusion will improve 02/02/2021 1900 by Rolm Baptise, RN Outcome: Progressing 02/02/2021 1828 by Rolm Baptise, RN Outcome: Progressing   Problem: Health Behavior/Discharge Planning: Goal: Ability to safely manage health-related needs after discharge will improve 02/02/2021 1900 by Rolm Baptise, RN Outcome: Progressing 02/02/2021 1828 by Rolm Baptise, RN Outcome:  Progressing   Problem: Activity: Goal: Ability to tolerate increased activity will improve 02/02/2021 1900 by Rolm Baptise, RN Outcome: Progressing 02/02/2021 1828 by Rolm Baptise, RN Outcome: Progressing   Problem: Respiratory: Goal: Ability to maintain a clear  airway and adequate ventilation will improve 02/02/2021 1900 by Rolm Baptise, RN Outcome: Progressing 02/02/2021 1828 by Rolm Baptise, RN Outcome: Progressing   Problem: Education: Goal: Ability to demonstrate management of disease process will improve 02/02/2021 1900 by Rolm Baptise, RN Outcome: Progressing 02/02/2021 1828 by Rolm Baptise, RN Outcome: Progressing Goal: Ability to verbalize understanding of medication therapies will improve 02/02/2021 1900 by Rolm Baptise, RN Outcome: Progressing 02/02/2021 1828 by Rolm Baptise, RN Outcome: Progressing   Problem: Activity: Goal: Capacity to carry out activities will improve 02/02/2021 1900 by Rolm Baptise, RN Outcome: Progressing 02/02/2021 1828 by Rolm Baptise, RN Outcome: Progressing   Problem: Cardiac: Goal: Ability to achieve and maintain adequate cardiopulmonary perfusion will improve 02/02/2021 1900 by Rolm Baptise, RN Outcome: Progressing 02/02/2021 1828 by Rolm Baptise, RN Outcome: Progressing

## 2021-02-03 DIAGNOSIS — R570 Cardiogenic shock: Secondary | ICD-10-CM | POA: Diagnosis not present

## 2021-02-03 DIAGNOSIS — G9340 Encephalopathy, unspecified: Secondary | ICD-10-CM | POA: Diagnosis not present

## 2021-02-03 DIAGNOSIS — J9601 Acute respiratory failure with hypoxia: Secondary | ICD-10-CM | POA: Diagnosis not present

## 2021-02-03 DIAGNOSIS — N170 Acute kidney failure with tubular necrosis: Secondary | ICD-10-CM | POA: Diagnosis not present

## 2021-02-03 LAB — COMPREHENSIVE METABOLIC PANEL
ALT: 45 U/L — ABNORMAL HIGH (ref 0–44)
AST: 18 U/L (ref 15–41)
Albumin: 2.6 g/dL — ABNORMAL LOW (ref 3.5–5.0)
Alkaline Phosphatase: 75 U/L (ref 38–126)
Anion gap: 9 (ref 5–15)
BUN: 32 mg/dL — ABNORMAL HIGH (ref 8–23)
CO2: 17 mmol/L — ABNORMAL LOW (ref 22–32)
Calcium: 8.5 mg/dL — ABNORMAL LOW (ref 8.9–10.3)
Chloride: 110 mmol/L (ref 98–111)
Creatinine, Ser: 1.7 mg/dL — ABNORMAL HIGH (ref 0.61–1.24)
GFR, Estimated: 43 mL/min — ABNORMAL LOW (ref >60)
Glucose, Bld: 89 mg/dL (ref 70–99)
Potassium: 3.5 mmol/L (ref 3.5–5.1)
Sodium: 136 mmol/L (ref 135–145)
Total Bilirubin: 0.9 mg/dL (ref 0.3–1.2)
Total Protein: 6 g/dL — ABNORMAL LOW (ref 6.5–8.1)

## 2021-02-03 LAB — GLUCOSE, CAPILLARY
Glucose-Capillary: 106 mg/dL — ABNORMAL HIGH (ref 70–99)
Glucose-Capillary: 106 mg/dL — ABNORMAL HIGH (ref 70–99)
Glucose-Capillary: 114 mg/dL — ABNORMAL HIGH (ref 70–99)
Glucose-Capillary: 127 mg/dL — ABNORMAL HIGH (ref 70–99)
Glucose-Capillary: 111 mg/dL — ABNORMAL HIGH (ref 70–99)

## 2021-02-03 LAB — CBC
HCT: 25.8 % — ABNORMAL LOW (ref 39.0–52.0)
Hemoglobin: 8.2 g/dL — ABNORMAL LOW (ref 13.0–17.0)
MCH: 30.5 pg (ref 26.0–34.0)
MCHC: 31.8 g/dL (ref 30.0–36.0)
MCV: 95.9 fL (ref 80.0–100.0)
Platelets: 185 10*3/uL (ref 150–400)
RBC: 2.69 MIL/uL — ABNORMAL LOW (ref 4.22–5.81)
RDW: 17.7 % — ABNORMAL HIGH (ref 11.5–15.5)
WBC: 12.6 10*3/uL — ABNORMAL HIGH (ref 4.0–10.5)
nRBC: 0 % (ref 0.0–0.2)

## 2021-02-03 LAB — MAGNESIUM: Magnesium: 1.6 mg/dL — ABNORMAL LOW (ref 1.7–2.4)

## 2021-02-03 MED ORDER — MAGNESIUM SULFATE 2 GM/50ML IV SOLN
2.0000 g | Freq: Once | INTRAVENOUS | Status: AC
  Administered 2021-02-03: 2 g via INTRAVENOUS
  Filled 2021-02-03: qty 50

## 2021-02-03 NOTE — Progress Notes (Signed)
[facility]  PROGRESS NOTE  [name] OAC:[id number] DOB: 1951-03-06 DOA: 01/03/2021 PCP: [name], MD   Brief HPI:   70 year old male with past medical history of hypertension, hyperlipidemia, CAD who was admitted with ST relation MI and cardiac arrest prior to coming to hospital.  He underwent emergent cardiac catheterization and PCI with stent to proximal RCA.  Ejection fraction was noted to be 25 to 30%.  Patient did have prolonged ICU stay for cardiogenic/hemorrhagic shock requiring vasopressors and transfusion.  He suffered from diminished mental status and MRI showed scattered punctate infarction concerning for anoxic injury.  Also had acute kidney injury and required CRRT and was subsequently transitioned to intermittent hemodialysis.  Patient was extubated on 01/15/2021 and reintubated on 01/20/2021.  Subsequently was extubated on 01/23/2021 and was transferred out of ICU.  Patient's mental status has significantly improved.  Palliative care was consulted and healthcare power of attorney decided not to escalate care and made him DNR.  Nephrology and cardiology have been following the patient.  Significant Events: 4/20 Impella(heart pump) removed. MRI brain with numerous micro embolic infarctions. Off pressors.  4/21 lasix drip stopped.  4/22 started CVVH 4/24 CVVH stopped. Failed extubation. Re-intubated.  4/26 Extubated  5/1 Re-intubated 5/4 Extubated, off pressors    Subjective   Patient seen and examined, ate his breakfast this morning.  Denies nausea or vomiting.  No chest pain or shortness of breath.   Assessment/Plan:     1. CAD/acute inferior STEMI-patient underwent emergent cardiac catheterization with PCI and drug-eluting stent to right coronary artery.  Continue DAPT, statin.  Patient not ARB due to acute kidney injury.  Continue hydralazine and nitrates. 2. Acute systolic heart failure/cardiogenic shock-patient's EF was found to be 25 to 30%, he underwent  CRRT.  Currently hemodialysis has been stopped.  Patient slowly improving.  Continue hydralazine, isosorbide, spironolactone. 3. S/p ventricular fibrillation arrest-s/p anoxic brain injury.  Cognition has improved.  Patient current status is DNR. 4. Fever with leukocytosis-likely secondary to pseudomonal UTI.  Hemodialysis catheter was removed including Foley catheter.  UA showed some pyuria.  Urine culture grew Pseudomonas.  Patient was initially started on Rocephin which was changed to ceftazidime.  Hemodialysis catheter tip culture showed 3000 colonies of staph epidermidis.  Likely contamination.  No need to treat as patient is improving.  Blood cultures have been negative in 4 days.  Chest x-ray showed minimal bibasilar atelectasis.  Fever has resolved.  WBC is down to 10,000.  5. Nonoliguric AKI-likely from ATN and cardiogenic shock.  Creatinine is slowly improving, today creatinine is 1.70 improved from 1.90 yesterday.   No further hemodialysis plan per nephrology.  HD catheter has been removed. 6. Hypomagnesemia-magnesium 1.6, will give 2 g of magnesium sulfate IV x1.  Follow serum magnesium level in a.m. 7. Non-anion gap metabolic acidosis-continue sodium bicarb tablets 650 mg p.o. twice daily.  Follow BMP in am. 8. Anoxic brain injury-MRI brain showed microembolic infarcts and EEG showed triphasic morphology with diffuse encephalopathy.  Patient has alert and communicating at this time.  Significantly improved.  Continue Provigil. 9. Hypernatremia-resolved, sodium is 136 today. 10. Nutrition-core track tube was inserted, unfortunately patient pulled out core track tube last night.  Palliative care had discussion with patient's [facility] POA who did not want PEG tube .plan was to continue core track tube feeding as long as possible.  As patient has pulled out core track feeding tube, and is eating well.  Will monitor without feeding tube and see if  his p.o. intake improves.  Diet has been changed to regular  as patient has been complaining of food not good in the hospital.      Scheduled medications:   . aspirin  81 mg Oral Daily  . enoxaparin (LOVENOX) injection  40 mg Subcutaneous Q24H  . feeding supplement  1 Container Oral TID BM  . hydrALAZINE  25 mg Oral Q8H  . insulin aspart  0-9 Units Subcutaneous TID AC & HS  . isosorbide dinitrate  10 mg Oral TID  . losartan  25 mg Oral Daily  . modafinil  100 mg Oral Daily  . pantoprazole sodium  40 mg Oral Daily  . rosuvastatin  10 mg Oral Daily  . sodium bicarbonate  650 mg Oral BID  . spironolactone  12.5 mg Oral Daily  . ticagrelor  90 mg Per Tube BID         Data Reviewed:   CBG:  Recent Labs  Lab 02/02/21 1620 02/02/21 2133 02/03/21 0600 02/03/21 0754 02/03/21 1223  GLUCAP 130* 138* 106* 106* 114*    SpO2: 98 % O2 Flow Rate (L/min): 2 L/min FiO2 (%): 30 %    Vitals:   02/02/21 1951 02/02/21 1952 02/03/21 0500 02/03/21 1300  BP:  113/61  131/69  Pulse:  84  84  Resp: (!) 23 (!) 24  20  Temp:  98.5 F (36.9 C)  98.8 F (37.1 C)  TempSrc:  Oral  Oral  SpO2:  98%  98%  Weight:   79.7 kg   Height:         Intake/Output Summary (Last 24 hours) at 02/03/2021 1424 Last data filed at 02/03/2021 1306 Gross per 24 hour  Intake 1965 ml  Output 1625 ml  Net 340 ml    05/13 1901 - 05/15 0700 In: 1965 [P.O.:1440] Out: [id number] [Urine:4050]  Filed Weights   02/01/21 0109 02/02/21 0255 02/03/21 0500  Weight: 82.3 kg 81.1 kg 79.7 kg    CBC:  Recent Labs  Lab 01/29/21 0334 01/30/21 0303 01/31/21 0316 02/01/21 0935 02/03/21 0307  WBC 15.2* 16.4* 14.6* 10.6* 12.6*  HGB 8.1* 8.2* 7.4* 8.1* 8.2*  HCT 25.1* 25.9* 23.0* 24.8* 25.8*  PLT 271 231 212 196 185  MCV 97.3 94.9 96.2 98.0 95.9  MCH 31.4 30.0 31.0 32.0 30.5  MCHC 32.3 31.7 32.2 32.7 31.8  RDW 16.5* 16.7* 17.1* 17.3* 17.7*    Complete metabolic panel:  Recent Labs  Lab 01/29/21 0334 01/30/21 0303 01/31/21 0316 02/01/21 0325 02/02/21 0259  02/03/21 0307  NA 140 139 136 136 136 136  K 3.6 3.8 3.6 4.2 3.9 3.5  CL 108 109 109 112* 109 110  CO2 21* 20* 18* 16* 17* 17*  GLUCOSE 126* 119* 110* 97 103* 89  BUN 76* 64* 57* 50* 41* 32*  CREATININE 2.13* 1.96* 1.96* 1.82* 1.90* 1.70*  CALCIUM 8.2* 8.3* 8.1* 8.2* 8.5* 8.5*  AST  --   --  26  --   --  18  ALT  --   --  83*  --   --  45*  ALKPHOS  --   --  81  --   --  75  BILITOT  --   --  0.4  --   --  0.9  ALBUMIN 2.3*  --  2.2*  --   --  2.6*  MG 2.0 1.9 1.8 1.9 1.7 1.6*    No results for input(s): LIPASE, AMYLASE in the last 168 hours.  No results for input(s): CRP, DDIMER, BNP, PROCALCITON, SARSCOV2NAA in the last 168 hours.  Invalid input(s): LACTICACID  ------------------------------------------------------------------------------------------------------------------ No results for input(s): CHOL, HDL, LDLCALC, TRIG, CHOLHDL, LDLDIRECT in the last 72 hours.  Lab Results  Component Value Date   HGBA1C 5.7 (H) 01/06/2021   ------------------------------------------------------------------------------------------------------------------ No results for input(s): TSH, T4TOTAL, T3FREE, THYROIDAB in the last 72 hours.  Invalid input(s): FREET3 ------------------------------------------------------------------------------------------------------------------ Recent Labs    02/01/21 0935  FERRITIN 749*  TIBC 256  IRON 86    Coagulation profile No results for input(s): INR, PROTIME in the last 168 hours. No results for input(s): DDIMER in the last 72 hours.  Cardiac Enzymes No results for input(s): CKTOTAL, CKMB, CKMBINDEX, TROPONINI in the last 168 hours.  ------------------------------------------------------------------------------------------------------------------ No results found for: BNP   Antibiotics: Anti-infectives (From admission, onward)   Start     Dose/Rate Route Frequency Ordered Stop   01/27/21 2200  cefTAZidime (FORTAZ) 2 g in sodium chloride  0.9 % 100 mL IVPB        2 g 200 mL/hr over 30 Minutes Intravenous Every 12 hours 01/27/21 1403 02/04/21 0959   01/27/21 1500  cefTAZidime (FORTAZ) 2 g in sodium chloride 0.9 % 100 mL IVPB  Status:  Discontinued        2 g 200 mL/hr over 30 Minutes Intravenous Every 12 hours 01/27/21 1402 01/27/21 1403   01/25/21 2200  cefTRIAXone (ROCEPHIN) 1 g in sodium chloride 0.9 % 100 mL IVPB  Status:  Discontinued       Note to Pharmacy: Dose was documented as given earlier this evening but IV became disconnected at some point and RN thinks he may not have received the dose   1 g 200 mL/hr over 30 Minutes Intravenous Every 24 hours 01/25/21 2107 01/27/21 1402   01/25/21 1830  cefTRIAXone (ROCEPHIN) 1 g in sodium chloride 0.9 % 100 mL IVPB  Status:  Discontinued        1 g 200 mL/hr over 30 Minutes Intravenous Every 24 hours 01/25/21 1740 01/25/21 2107   01/13/21 1400  piperacillin-tazobactam (ZOSYN) IVPB 3.375 g  Status:  Discontinued        3.375 g 12.5 mL/hr over 240 Minutes Intravenous Every 8 hours 01/13/21 1030 01/15/21 0813   01/13/21 1200  piperacillin-tazobactam (ZOSYN) IVPB 3.375 g  Status:  Discontinued        3.375 g 100 mL/hr over 30 Minutes Intravenous Every 6 hours 01/13/21 0955 01/13/21 1030   01/13/21 1045  ceFEPIme (MAXIPIME) 2 g in sodium chloride 0.9 % 100 mL IVPB  Status:  Discontinued        2 g 200 mL/hr over 30 Minutes Intravenous Every 12 hours 01/13/21 0949 01/13/21 0954   01/08/21 1100  cefTRIAXone (ROCEPHIN) 2 g in sodium chloride 0.9 % 100 mL IVPB  Status:  Discontinued        2 g 200 mL/hr over 30 Minutes Intravenous Every 24 hours 01/07/21 1244 01/10/21 1012   01/08/21 0600  vancomycin (VANCOREADY) IVPB 1250 mg/250 mL  Status:  Discontinued        1,250 mg 166.7 mL/hr over 90 Minutes Intravenous Every 48 hours 01/06/21 2202 01/08/21 1010   01/07/21 1108  ceFEPIme (MAXIPIME) 2 g in sodium chloride 0.9 % 100 mL IVPB  Status:  Discontinued        2 g 200 mL/hr over 30  Minutes Intravenous Every 24 hours 01/06/21 2202 01/07/21 1244   01/06/21 1200  vancomycin (VANCOREADY) IVPB 1000 mg/200  mL  Status:  Discontinued        1,000 mg 200 mL/hr over 60 Minutes Intravenous Every 24 hours 01/05/21 2209 01/06/21 2202   01/06/21 1200  ceFEPIme (MAXIPIME) 2 g in sodium chloride 0.9 % 100 mL IVPB  Status:  Discontinued        2 g 200 mL/hr over 30 Minutes Intravenous Every 12 hours 01/05/21 2209 01/06/21 2202   01/04/21 1400  ceFEPIme (MAXIPIME) 2 g in sodium chloride 0.9 % 100 mL IVPB        2 g 200 mL/hr over 30 Minutes Intravenous Every 8 hours 01/04/21 1335 01/05/21 2231   01/04/21 1000  ceFEPIme (MAXIPIME) 2 g in sodium chloride 0.9 % 100 mL IVPB  Status:  Discontinued        2 g 200 mL/hr over 30 Minutes Intravenous Every 12 hours 01/03/21 2131 01/04/21 1335   01/04/21 0600  vancomycin (VANCOREADY) IVPB 750 mg/150 mL  Status:  Discontinued        750 mg 150 mL/hr over 60 Minutes Intravenous Every 12 hours 01/03/21 1436 01/05/21 2209   01/03/21 2200  vancomycin (VANCOREADY) IVPB 750 mg/150 mL  Status:  Discontinued        750 mg 150 mL/hr over 60 Minutes Intravenous Every 12 hours 01/03/21 1241 01/03/21 1436   01/03/21 1400  ceFEPIme (MAXIPIME) 2 g in sodium chloride 0.9 % 100 mL IVPB  Status:  Discontinued        2 g 200 mL/hr over 30 Minutes Intravenous Every 8 hours 01/03/21 1238 01/03/21 2131   01/03/21 1330  vancomycin (VANCOREADY) IVPB 1250 mg/250 mL        1,250 mg 166.7 mL/hr over 90 Minutes Intravenous  Once 01/03/21 1241 01/03/21 1640       Radiology Reports  No results found.    DVT prophylaxis: Lovenox  Code Status: Full code  Family Communication: No family at bedside   Consultants:  PCCM  Cardiology  Nephrology  Procedures:  Intubation  Cardiac catheterization  CRRT    Objective    Physical Examination:    General-appears in no acute distress  Heart-S1-S2, regular, no murmur auscultated  Lungs-clear to  auscultation bilaterally, no wheezing or crackles auscultated  Abdomen-soft, nontender, no organomegaly  Extremities-no edema in the lower extremities  Neuro-alert, oriented x3, no focal deficit noted   Status is: Inpatient  Dispo: The patient is from: Home              Anticipated d/c is to: Skilled nursing facility versus inpatient rehab              Anticipated d/c date is: 02/05/2021              Patient currently  stable for discharge  Barrier to discharge-awaiting bed at skilled nursing facility  [facility]    02/01/21 0935  FERRITIN 749*    Lab Results  Component Value Date   [place] NEGATIVE 01/03/2021    Microbiology  Recent Results (from the past 240 hour(s))  Culture, Urine     Status: Abnormal   Collection Time: 01/25/21  8:48 AM   Specimen: Urine, Catheterized  Result Value Ref Range Status   Specimen Description URINE, CATHETERIZED  Final   Special Requests   Final    NONE Performed at [facility], [address] [place], [place] [address]    Culture 80,000 COLONIES/mL PSEUDOMONAS AERUGINOSA (A)  Final   Report Status 01/27/2021  FINAL  Final   Organism ID, Bacteria PSEUDOMONAS AERUGINOSA (A)  Final      Susceptibility   Pseudomonas aeruginosa - MIC*    CEFTAZIDIME 4 SENSITIVE Sensitive     CIPROFLOXACIN <=0.25 SENSITIVE Sensitive     GENTAMICIN <=1 SENSITIVE Sensitive     IMIPENEM 2 SENSITIVE Sensitive     PIP/TAZO 16 SENSITIVE Sensitive     CEFEPIME 2 SENSITIVE Sensitive     * 80,000 COLONIES/mL PSEUDOMONAS AERUGINOSA  Culture, blood (routine x 2)     Status: None   Collection Time: 01/25/21 10:53 AM   Specimen: BLOOD LEFT FOREARM  Result Value Ref Range Status   Specimen Description BLOOD LEFT FOREARM  Final   Special Requests   Final    BOTTLES DRAWN AEROBIC ONLY Blood Culture results may not be optimal due to an excessive volume of blood received in culture bottles   Culture   Final    NO GROWTH 5  DAYS Performed at [facility] [address], [place], [place] [address]    Report Status 01/30/2021 FINAL  Final  Culture, blood (routine x 2)     Status: None   Collection Time: 01/25/21 10:53 AM   Specimen: BLOOD RIGHT FOREARM  Result Value Ref Range Status   Specimen Description BLOOD RIGHT FOREARM  Final   Special Requests   Final    BOTTLES DRAWN AEROBIC ONLY Blood Culture adequate volume   Culture   Final    NO GROWTH 5 DAYS Performed at [facility], [place] [address] [place], [place] [address]    Report Status 01/30/2021 FINAL  Final  Cath Tip Culture     Status: Abnormal   Collection Time: 01/25/21  8:00 PM   Specimen: Catheter Tip; Other  Result Value Ref Range Status   Specimen Description CATH TIP  Final   Special Requests   Final    NONE Performed at [facility], [place] [address], [place], [place] [address]    Culture 3,000 COLONIES/mL STAPHYLOCOCCUS EPIDERMIDIS (A)  Final   Report Status 01/30/2021 FINAL  Final   Organism ID, Bacteria STAPHYLOCOCCUS EPIDERMIDIS (A)  Final      Susceptibility   Staphylococcus epidermidis - MIC*    CIPROFLOXACIN <=0.5 SENSITIVE Sensitive     ERYTHROMYCIN <=0.25 SENSITIVE Sensitive     GENTAMICIN <=0.5 SENSITIVE Sensitive     OXACILLIN >=4 RESISTANT Resistant     TETRACYCLINE 2 SENSITIVE Sensitive     VANCOMYCIN 1 SENSITIVE Sensitive     TRIMETH/SULFA <=10 SENSITIVE Sensitive     CLINDAMYCIN <=0.25 SENSITIVE Sensitive     RIFAMPIN <=0.5 SENSITIVE Sensitive     Inducible Clindamycin NEGATIVE Sensitive     * 3,000 COLONIES/mL STAPHYLOCOCCUS EPIDERMIDIS    Pressure Injury 01/20/21 Heel Left Deep Tissue Pressure Injury - Purple or maroon localized area of discolored intact skin or blood-filled blister due to damage of underlying soft tissue from pressure and/or shear. (Active)  01/20/21 0100  Location: Heel  Location Orientation: Left  Staging: Deep Tissue Pressure Injury - Purple or maroon localized area of  discolored intact skin or blood-filled blister due to damage of underlying soft tissue from pressure and/or shear.  Wound Description (Comments):   Present on Admission: No       [facility] If 7PM-7AM, please contact night-coverage at [email or web address], Office  [phone]   02/03/2021, 2:24 PM  LOS: 31 days

## 2021-02-03 NOTE — Progress Notes (Signed)
Patient ID: [name], male   DOB: 11/17/1950, 70 y.o.   MRN: [id number]    Cardiology Rounding Note  PCP-Cardiologist: None   Subjective:    Stable renal function (creatinine 2.0 > 1.8 > 1.9 > 1.7).  Net -385 yesterday.  BP 113/61.  Denies any chest pain or dyspnea.    Objective:   Weight Range: 79.7 kg Body mass index is 23.17 kg/m.   Vital Signs:   Temp:  [98.5 F (36.9 C)-98.6 F (37 C)] 98.5 F (36.9 C) (05/14 1952) Pulse Rate:  [60-100] 84 (05/14 1952) Resp:  [10-35] 24 (05/14 1952) BP: (93-157)/(61-97) 113/61 (05/14 1952) SpO2:  [42 %-100 %] 98 % (05/14 1952) Weight:  [79.7 kg] 79.7 kg (05/15 0500) Last BM Date: 02/02/21  Weight change: Filed Weights   02/01/21 0109 02/02/21 0255 02/03/21 0500  Weight: 82.3 kg 81.1 kg 79.7 kg    Intake/Output:   Intake/Output Summary (Last 24 hours) at 02/03/2021 0738 Last data filed at 02/03/2021 0500 Gross per 24 hour  Intake 1965 ml  Output 2350 ml  Net -385 ml      Physical Exam   General:  Lying in bed No resp difficulty Neck: supple. No JVD.  Cor: Regular rate & rhythm. No rubs, gallops or murmurs. Lungs: CTAB, no increased wob Abdomen: soft, nontender, nondistended.  Extremities: no edema Neuro: alert, moves all 4 extremities w/o difficulty. Affect pleasant   Telemetry   NSR 80s Personally reviewed  Labs    CBC Recent Labs    02/01/21 0935 02/03/21 0307  WBC 10.6* 12.6*  HGB 8.1* 8.2*  HCT 24.8* 25.8*  MCV 98.0 95.9  PLT 196 768   Basic Metabolic Panel Recent Labs    02/02/21 0259 02/03/21 0307  NA 136 136  K 3.9 3.5  CL 109 110  CO2 17* 17*  GLUCOSE 103* 89  BUN 41* 32*  CREATININE 1.90* 1.70*  CALCIUM 8.5* 8.5*  MG 1.7 1.6*   Liver Function Tests Recent Labs    02/03/21 0307  AST 18  ALT 45*  ALKPHOS 75  BILITOT 0.9  PROT 6.0*  ALBUMIN 2.6*   No results for input(s): LIPASE, AMYLASE in the last 72 hours. Cardiac Enzymes No results for input(s): CKTOTAL, CKMB,  CKMBINDEX, TROPONINI in the last 72 hours.  BNP: BNP (last 3 results) No results for input(s): BNP in the last 8760 hours.  ProBNP (last 3 results) No results for input(s): PROBNP in the last 8760 hours.   D-Dimer No results for input(s): DDIMER in the last 72 hours. Hemoglobin A1C No results for input(s): HGBA1C in the last 72 hours. Fasting Lipid Panel No results for input(s): CHOL, HDL, LDLCALC, TRIG, CHOLHDL, LDLDIRECT in the last 72 hours. Thyroid Function Tests No results for input(s): TSH, T4TOTAL, T3FREE, THYROIDAB in the last 72 hours.  Invalid input(s): FREET3  Other results:   Imaging    No results found.   Medications:     Scheduled Medications: . aspirin  81 mg Oral Daily  . chlorhexidine gluconate (MEDLINE KIT)  15 mL Mouth Rinse BID  . enoxaparin (LOVENOX) injection  40 mg Subcutaneous Q24H  . feeding supplement  1 Container Oral TID BM  . hydrALAZINE  25 mg Oral Q8H  . insulin aspart  0-9 Units Subcutaneous TID AC & HS  . isosorbide dinitrate  10 mg Oral TID  . losartan  25 mg Oral Daily  . mouth rinse  15 mL Mouth Rinse q12n4p  . modafinil  100 mg Oral Daily  . pantoprazole sodium  40 mg Oral Daily  . rosuvastatin  10 mg Oral Daily  . sodium bicarbonate  650 mg Oral BID  . spironolactone  12.5 mg Oral Daily  . ticagrelor  90 mg Per Tube BID    Infusions: . cefTAZidime (FORTAZ)  IV 2 g (02/02/21 2157)    PRN Medications: acetaminophen (TYLENOL) oral liquid 160 mg/5 mL, atropine, ipratropium-albuterol, ondansetron (ZOFRAN) IV, sodium chloride flush, Zinc Oxide    Assessment/Plan   1. CAD/ Acute Inferior STEMI - emergent cath w/ pRCA occlusion s/p PCI + DES - Continue DAPT/statin - no s/s angina  2. VF Arrest - in setting of acute MI, now s/p revascularization  - No further VF. Rhythm stable   3. Acute Systolic Heart Failure>>Cardiogenic Shock  - ICM Echo LVEF 20-25%, RV moderately reduced - Impella out 4/20. - CVVH stopped 4/24.   - Appears euvolemic on exam, holding diuresis for now - As renal function continues to improve can expand GDMT.  - Continue Hydral and nitrates - Continue spiro 12.5 -Continue losartan 25 mg daily -avoiding sglt2i with recent UTI   4. Acute hypoxic respiratory failure - 4/24 failed extubation trial.  - 5/1 reintubated due to inability to handle secretions/hemoptysis - 5/4 extubated   5. Nonoliguric AKI - due to ATN/shock - CVVH begun 4/22 and stopped 4/24. Had one session of iHD on 4/30 for clearance - Renal function now improving   6. Anoxic brain injury  - EEG w/ generalized periodic discharges with triphasic morphology + moderate to severe diffuse encephalopathy.  - MRI- numerous micro embolic infarctions . No hemorrhage or mass.  - CIR has turned down due to poor rehab potential. Recommending SNF - Dramatic improvement and re-evaluated by CIR however turned down again due to his need for long term care which would be difficult to achieve after CIR.   - Awaiting SNF placement  7. Normocytic Anemia -likely combination of blood loss, acute illness and inflammation, renal dysfunction -Hemoglobin stable at 8.2  Length of Stay: 31  Donato Heinz, MD  7:38 AM

## 2021-02-03 NOTE — TOC Progression Note (Signed)
Transition of Care [facility]) - Progression Note    Patient Details  Name: [name] MRN: [id number] Date of Birth: Apr 21, 1951  [facility] CM/SW Pearson, Nikolai Phone Number: [phone] 02/03/2021, 4:41 PM  Clinical Narrative:     CSW attempted to follow up with [facility] with current bed offers and had to leave a message.  TOC team will continue to assist with discharge planning needs.  Expected Discharge Plan: [place] Barriers to Discharge: Continued Medical Work up  Expected Discharge Plan and Services Expected Discharge Plan: [place] In-house Referral: Clinical Social Work Discharge Planning Services: CM Consult Post Acute Care Choice: [facility] arrangements for the past 2 months: Single Family Home                                       Social Determinants of Health (SDOH) Interventions    Readmission Risk Interventions No flowsheet data found.

## 2021-02-04 DIAGNOSIS — J9601 Acute respiratory failure with hypoxia: Secondary | ICD-10-CM | POA: Diagnosis not present

## 2021-02-04 DIAGNOSIS — N170 Acute kidney failure with tubular necrosis: Secondary | ICD-10-CM | POA: Diagnosis not present

## 2021-02-04 DIAGNOSIS — R570 Cardiogenic shock: Secondary | ICD-10-CM | POA: Diagnosis not present

## 2021-02-04 LAB — BASIC METABOLIC PANEL
Anion gap: 9 (ref 5–15)
BUN: 26 mg/dL — ABNORMAL HIGH (ref 8–23)
CO2: 20 mmol/L — ABNORMAL LOW (ref 22–32)
Calcium: 8.5 mg/dL — ABNORMAL LOW (ref 8.9–10.3)
Chloride: 108 mmol/L (ref 98–111)
Creatinine, Ser: 1.76 mg/dL — ABNORMAL HIGH (ref 0.61–1.24)
GFR, Estimated: 41 mL/min — ABNORMAL LOW (ref >60)
Glucose, Bld: 111 mg/dL — ABNORMAL HIGH (ref 70–99)
Potassium: 3.5 mmol/L (ref 3.5–5.1)
Sodium: 137 mmol/L (ref 135–145)

## 2021-02-04 LAB — GLUCOSE, CAPILLARY
Glucose-Capillary: 111 mg/dL — ABNORMAL HIGH (ref 70–99)
Glucose-Capillary: 92 mg/dL (ref 70–99)
Glucose-Capillary: 107 mg/dL — ABNORMAL HIGH (ref 70–99)
Glucose-Capillary: 103 mg/dL — ABNORMAL HIGH (ref 70–99)

## 2021-02-04 LAB — MAGNESIUM: Magnesium: 2 mg/dL (ref 1.7–2.4)

## 2021-02-04 MED ORDER — ACETAMINOPHEN 325 MG PO TABS
650.0000 mg | ORAL_TABLET | Freq: Four times a day (QID) | ORAL | Status: DC | PRN
  Administered 2021-02-04 (×2): 650 mg via ORAL
  Filled 2021-02-04: qty 2

## 2021-02-04 NOTE — Care Management Important Message (Signed)
Important Message  Patient Details  Name: [name] MRN: [id number] Date of Birth: [birth year]   Medicare Important Message Given:  Yes     [name] 02/04/2021, 10:50 AM

## 2021-02-04 NOTE — Progress Notes (Signed)
  Speech Language Pathology Treatment: Cognitive-Linquistic  Patient Details Name: [name] MRN: [id number] DOB: 1951/08/14 Today's Date: 02/04/2021 Time: 8938-1017 SLP Time Calculation (min) (ACUTE ONLY): 15 min  Assessment / Plan / Recommendation Clinical Impression  Pt was seen for cognitive-linguistic therapy, completing divergent naming tasks with Mod cues needed for sustained attention. Throughout trials he had several instances where he would get off topic and/or start repeating what he had already said. He was able to produce 11 words in one minute when given a familiar category, but only 3 words in one minute with a more linguistically challenging task in which he was given a specific letter with which all words must start. Throughout session pt also demonstrated vague recall of recent events and information, telling me broad categories about articles he had read in the newspaper or telling me about the educational handouts he had received. He was not able to recall specific details from any of the above, but when pointed in the right direction, could read the information. Continue to recommend SNF.   HPI HPI: Pt is 70 y.o. with PMH of  HTN, HLD, CAD, admitted with chest pain and STEMI with bradycardia and hypotension. Pt taken for urgent cath with temporary pacer placed but pt decompensated to cardiac arrest with intubation and CPR. Stent placed and pt with further complication of cardiogenic shock with Impella placed. 4/18 seizure like activity, Impella removed 4/20, 4/20 MRI with scattered micro embolic infarctions. ETT 4/14-4/24 and reintubated 2 hrs later. Extubated 4/26. EEG (4/23) revealed moderate diffuse encephalopathy, nonspecific etiology. Chest xray (4/24) revealed:"Bibasilar opacities are mildly worsened in the interval worrisome for pneumonia". Failed [facility] Swallow Screen 2/26. Patient with subsequent reintubation on 5/2 with interval extubation by at least 5/6.  Most recent  chest xray dated 05/06 was showing no acdtive cardiopulmonary disease.      SLP Plan  Continue with current plan of care       Recommendations                   Follow up Recommendations: Skilled Nursing facility SLP Visit Diagnosis: Cognitive communication deficit (P10.258) Plan: Continue with current plan of care       GO                Osie Bond., M.A. [place] Acute Rehabilitation Services Pager [phone] Office [phone]  02/04/2021, 4:34 PM

## 2021-02-04 NOTE — TOC Progression Note (Signed)
Transition of Care (TOC) - Progression Note  Heart Failure   Patient Details  Name: [name] MRN: [id number] Date of Birth: 01/15/1951  Transition [facility]) CM/SW [place], [place] Phone Number: 02/04/2021, 10:27 AM  Clinical Narrative:    CSW spoke with [name] [phone] over the phone to discuss the bed offers for rehab at a skilled nursing facility when Mr. Peruski is medically stable for discharge. CSW presented the 11 bed offers and emailed Mr. Kellie Simmering the medicare rating list as well for him to better make a decision about which facility would be best. Mr. Kellie Simmering would like some time to decide and will be in touch and CSW will keep the HCPOA in the loop as the patient progresses.  TOC will continue to follow for discharge needs.    Expected Discharge Plan: [place] Barriers to Discharge: Continued Medical Work up  Expected Discharge Plan and Services Expected Discharge Plan: [place] In-house Referral: Clinical Social Work Discharge Planning Services: CM Consult Post Acute Care Choice: [place] arrangements for the past 2 months: Single Family Home                                       Social Determinants of Health (SDOH) Interventions    Readmission Risk Interventions No flowsheet data found.  Syrena Burges, MSW, [place] Heart Failure Social Worker

## 2021-02-04 NOTE — Progress Notes (Signed)
Pt just walked with PT. He is progressing well. Discussed education and he was able to appropriately engage in discussion and ask questions. Discussed MI, stent, Brilinta, HF management, low sodium, daily wts, importance of continued therapy, and eventual CRPII. Pt receptive. Gave him materials to read and watch videos. Will refer to [place]. Will f/u for ambulation and continued reiteration of education. [place] CES, ACSM 2:43 PM 02/04/2021

## 2021-02-04 NOTE — Progress Notes (Signed)
Physical Therapy Treatment Patient Details Name: [name] MRN: [id number] DOB: 09/25/1950 Today's Date: 02/04/2021    History of Present Illness Pt is 70 yo admitted 4/14 with chest pain and STEMI with bradycardia and hypotension. Pt taken for urgent cath with temporary pacer placed but pt decompensated to cardiac arrest with intubation and CPR. Stent placed and pt with further complication of cardiogenic shock with Impella placed. 4/18 seizure like activity, Impella removed 4/20, 4/20 MRI with scattered micro embolic infarctions. Extubated 4/24 and reintubated 2 hrs later. Extubated 4/26. Reintubated 01/20/21. Extubated 01/23/21.  PMHx: HTN, HLD, CAD    PT Comments    Patient progressing well towards PT goals. Tolerated transfers and gait training with Min guard-supervision for safety. Some difficulty noted with dual tasking esp when asked to participate in cognitive tasks during ambulation. Balance mildly impacted when dual tasking. Pt needed to stop and respond with increased time and at times needed contextual cues. Memory seems to be improving as pt recalls events from weekend and a male therapist working with him last week. Safety awareness is also improving as pt reports he feels safer using RW during ambulation and does not feel like his strength is back to baseline. Discussed techniques he can use to help with memory- making lists, calendars, writing things down etc which pt reports he was using PTA. Will attempt higher level balance activities next session and gait training without use of DME to progress pt to independence. Continues to be appropriate for SNF due to balance deficits and cognitive deficits putting pt at increased risk for falls. Will follow.   Follow Up Recommendations  SNF     Equipment Recommendations  Rolling walker with 5" wheels    Recommendations for Other Services       Precautions / Restrictions Precautions Precautions: Fall;Other (comment) Precaution  Comments: expressive and receptive aphasia qualities Restrictions Weight Bearing Restrictions: No    Mobility  Bed Mobility Overal bed mobility: Needs Assistance Bed Mobility: Supine to Sit;Sit to Supine     Supine to sit: Supervision Sit to supine: Supervision   General bed mobility comments: Supervision for safety. No assist needed, no dizziness.    Transfers Overall transfer level: Needs assistance Equipment used: Rolling walker (2 wheeled) Transfers: Sit to/from Stand Sit to Stand: Supervision         General transfer comment: Supervision for safety. Stood from [employer].  Ambulation/Gait Ambulation/Gait assistance: Min guard Gait Distance (Feet): 200 Feet (+ 200') Assistive device: Rolling walker (2 wheeled) Gait Pattern/deviations: Step-through pattern   Gait velocity interpretation: 1.31 - 2.62 ft/sec, indicative of limited community ambulator General Gait Details: Slow, mostly steady gait but needed to stop during cognitive tasks/dual tasking. Turning around impulsively at times needing close MIn guard for safety due to impaired balance. No bumping into objects today. Will attempt without RW next session.   Stairs             Wheelchair Mobility    Modified Rankin (Stroke Patients Only) Modified Rankin (Stroke Patients Only) Pre-Morbid Rankin Score: No symptoms Modified Rankin: Moderately severe disability     Balance Overall balance assessment: Needs assistance Sitting-balance support: No upper extremity supported;Feet supported Sitting balance-Leahy Scale: Good     Standing balance support: During functional activity Standing balance-Leahy Scale: Fair Standing balance comment: reliant on UE support of walker for longer distances; tolerated short distances without use of DME.  Cognition Arousal/Alertness: Awake/alert Behavior During Therapy: WFL for tasks assessed/performed Overall Cognitive Status:  Impaired/Different from baseline Area of Impairment: Memory;Following commands;Attention;Problem solving                   Current Attention Level: Selective Memory: Decreased short-term memory Following Commands: Follows one step commands consistently   Awareness: Emergent   General Comments: Oriented to date today; recalling events from therapy and the weekend. Seems to have better awareness of safety/deficits, feels better using RW. Difficulty dual tasking- worked on cognitive tasks while walking and needed to stop and answer questions. Quick with answering questions related to money/change (retired Pharmacist, hospital). Asking appropriate questions regarding d/c plans.      Exercises      General Comments General comments (skin integrity, edema, etc.): VSS on RA.      Pertinent Vitals/Pain Pain Assessment: 0-10 Pain Score: 4  Pain Location: head Pain Descriptors / Indicators: Headache Pain Intervention(s): Monitored during session;Premedicated before session    Home Living                      Prior Function            PT Goals (current goals can now be found in the care plan section) Progress towards PT goals: Progressing toward goals    Frequency    Min 3X/week      PT Plan Current plan remains appropriate    Co-evaluation              AM-PAC PT "6 Clicks" Mobility   Outcome Measure  Help needed turning from your back to your side while in a flat bed without using bedrails?: None Help needed moving from lying on your back to sitting on the side of a flat bed without using bedrails?: A Little Help needed moving to and from a bed to a chair (including a wheelchair)?: A Little Help needed standing up from a chair using your arms (e.g., wheelchair or bedside chair)?: A Little Help needed to walk in hospital room?: A Little Help needed climbing 3-5 steps with a railing? : A Little 6 Click Score: 19    End of Session Equipment Utilized During  Treatment: Gait belt Activity Tolerance: Patient tolerated treatment well Patient left: in bed;with call bell/phone within reach;with bed alarm set Nurse Communication: Mobility status PT Visit Diagnosis: Other abnormalities of gait and mobility (R26.89);Muscle weakness (generalized) (M62.81);Difficulty in walking, not elsewhere classified (R26.2);Unsteadiness on feet (R26.81)     Time: 0932-6712 PT Time Calculation (min) (ACUTE ONLY): 29 min  Charges:  $Gait Training: 8-22 mins $Therapeutic Activity: 8-22 mins                     Marisa Severin, PT, DPT Acute Rehabilitation Services Pager [phone] Office [place] 02/04/2021, 1:54 PM

## 2021-02-04 NOTE — Progress Notes (Signed)
[facility]  PROGRESS NOTE  [name] DUK:[id number] DOB: Jul 28, 1951 DOA: 01/03/2021 PCP: [name], MD   Brief HPI:   70 year old male with past medical history of hypertension, hyperlipidemia, CAD who was admitted with ST relation MI and cardiac arrest prior to coming to hospital.  He underwent emergent cardiac catheterization and PCI with stent to proximal RCA.  Ejection fraction was noted to be 25 to 30%.  Patient did have prolonged ICU stay for cardiogenic/hemorrhagic shock requiring vasopressors and transfusion.  He suffered from diminished mental status and MRI showed scattered punctate infarction concerning for anoxic injury.  Also had acute kidney injury and required CRRT and was subsequently transitioned to intermittent hemodialysis.  Patient was extubated on 01/15/2021 and reintubated on 01/20/2021.  Subsequently was extubated on 01/23/2021 and was transferred out of ICU.  Patient's mental status has significantly improved.  Palliative care was consulted and healthcare power of attorney decided not to escalate care and made him DNR.  Nephrology and cardiology have been following the patient.  Significant Events: 4/20 Impella(heart pump) removed. MRI brain with numerous micro embolic infarctions. Off pressors.  4/21 lasix drip stopped.  4/22 started CVVH 4/24 CVVH stopped. Failed extubation. Re-intubated.  4/26 Extubated  5/1 Re-intubated 5/4 Extubated, off pressors    Subjective   Patient seen and examined, denies chest pain or shortness of breath.  He ate his breakfast this morning.   Assessment/Plan:     1. CAD/acute inferior STEMI-patient underwent emergent cardiac catheterization with PCI and drug-eluting stent to right coronary artery.  Continue DAPT, statin.  Patient not ARB due to acute kidney injury.  Continue hydralazine and nitrates. 2. Acute systolic heart failure/cardiogenic shock-patient's EF was found to be 25 to 30%, he underwent CRRT.  Currently  hemodialysis has been stopped.  Patient slowly improving.  Continue hydralazine, isosorbide, spironolactone. 3. S/p ventricular fibrillation arrest-s/p anoxic brain injury.  Cognition has improved.  Patient current status is DNR. 4. Fever with leukocytosis-likely secondary to pseudomonal UTI.  Hemodialysis catheter was removed including Foley catheter.  UA showed some pyuria.  Urine culture grew Pseudomonas.  Patient was initially started on Rocephin which was changed to ceftazidime.  Hemodialysis catheter tip culture showed 3000 colonies of staph epidermidis.  Likely contamination.  No need to treat as patient is improving.  Blood cultures have been negative in 4 days.  Chest x-ray showed minimal bibasilar atelectasis.  Fever has resolved.  WBC is 12,600 today. 5. Nonoliguric AKI-likely from ATN and cardiogenic shock.  Creatinine is slowly improving, today creatinine is 1.76 improved from 1.90 yesterday.   No further hemodialysis plan per nephrology.  HD catheter has been removed. 6. Hypomagnesemia-magnesium 1.6, will give 2 g of magnesium sulfate IV x1.  Follow serum magnesium level in a.m. 7. Non-anion gap metabolic acidosis-continue sodium bicarb tablets 650 mg p.o. twice daily.  CO2 improved to 20. 8. Anoxic brain injury-MRI brain showed microembolic infarcts and EEG showed triphasic morphology with diffuse encephalopathy.  Patient has alert and communicating at this time.  Significantly improved.  Continue Provigil. 9. Hypernatremia-resolved, sodium is 137 today. 10. Nutrition-core track tube was inserted, unfortunately patient pulled out core track tube last night.  Palliative care had discussion with patient's [facility] POA who did not want PEG tube .plan was to continue core track tube feeding as long as possible.  As patient has pulled out core track feeding tube, and is eating well.  Will monitor without feeding tube and see if his p.o. intake improves.  Diet  has been changed to regular as patient has  been complaining of food not good in the hospital.      Scheduled medications:   . aspirin  81 mg Oral Daily  . enoxaparin (LOVENOX) injection  40 mg Subcutaneous Q24H  . feeding supplement  1 Container Oral TID BM  . hydrALAZINE  25 mg Oral Q8H  . insulin aspart  0-9 Units Subcutaneous TID AC & HS  . isosorbide dinitrate  10 mg Oral TID  . losartan  25 mg Oral Daily  . modafinil  100 mg Oral Daily  . pantoprazole sodium  40 mg Oral Daily  . rosuvastatin  10 mg Oral Daily  . sodium bicarbonate  650 mg Oral BID  . spironolactone  12.5 mg Oral Daily  . ticagrelor  90 mg Per Tube BID         Data Reviewed:   CBG:  Recent Labs  Lab 02/03/21 1223 02/03/21 1701 02/03/21 2110 02/04/21 0618 02/04/21 1112  GLUCAP 114* 127* 111* 111* 92    SpO2: 100 % O2 Flow Rate (L/min): 2 L/min FiO2 (%): 30 %    Vitals:   02/04/21 0002 02/04/21 0324 02/04/21 0801 02/04/21 1114  BP:  132/74 127/81 (!) 114/57  Pulse: 77 75 90 85  Resp:  16 17 17   Temp: 98.3 F (36.8 C) 98.6 F (37 C) 98.3 F (36.8 C) 98.6 F (37 C)  TempSrc: Oral Oral Oral Oral  SpO2: 98% 98% 100% 100%  Weight:  79.7 kg    Height:         Intake/Output Summary (Last 24 hours) at 02/04/2021 1459 Last data filed at 02/04/2021 1238 Gross per 24 hour  Intake 1504 ml  Output 1875 ml  Net -371 ml    05/14 1901 - 05/16 0700 In: [id number] [P.O.:1644] Out: [id number] [Urine:2475]  Filed Weights   02/02/21 0255 02/03/21 0500 02/04/21 0324  Weight: 81.1 kg 79.7 kg 79.7 kg    CBC:  Recent Labs  Lab 01/29/21 0334 01/30/21 0303 01/31/21 0316 02/01/21 0935 02/03/21 0307  WBC 15.2* 16.4* 14.6* 10.6* 12.6*  HGB 8.1* 8.2* 7.4* 8.1* 8.2*  HCT 25.1* 25.9* 23.0* 24.8* 25.8*  PLT 271 231 212 196 185  MCV 97.3 94.9 96.2 98.0 95.9  MCH 31.4 30.0 31.0 32.0 30.5  MCHC 32.3 31.7 32.2 32.7 31.8  RDW 16.5* 16.7* 17.1* 17.3* 17.7*    Complete metabolic panel:  Recent Labs  Lab 01/29/21 0334 01/30/21 0303  01/31/21 0316 02/01/21 0325 02/02/21 0259 02/03/21 0307 02/04/21 0639  NA 140   < > 136 136 136 136 137  K 3.6   < > 3.6 4.2 3.9 3.5 3.5  CL 108   < > 109 112* 109 110 108  CO2 21*   < > 18* 16* 17* 17* 20*  GLUCOSE 126*   < > 110* 97 103* 89 111*  BUN 76*   < > 57* 50* 41* 32* 26*  CREATININE 2.13*   < > 1.96* 1.82* 1.90* 1.70* 1.76*  CALCIUM 8.2*   < > 8.1* 8.2* 8.5* 8.5* 8.5*  AST  --   --  26  --   --  18  --   ALT  --   --  83*  --   --  45*  --   ALKPHOS  --   --  81  --   --  75  --   BILITOT  --   --  0.4  --   --  0.9  --   ALBUMIN 2.3*  --  2.2*  --   --  2.6*  --   MG 2.0   < > 1.8 1.9 1.7 1.6* 2.0   < > = values in this interval not displayed.    No results for input(s): LIPASE, AMYLASE in the last 168 hours.  No results for input(s): CRP, DDIMER, BNP, PROCALCITON, SARSCOV2NAA in the last 168 hours.  Invalid input(s): LACTICACID  ------------------------------------------------------------------------------------------------------------------ No results for input(s): CHOL, HDL, LDLCALC, TRIG, CHOLHDL, LDLDIRECT in the last 72 hours.  Lab Results  Component Value Date   HGBA1C 5.7 (H) 01/06/2021   ------------------------------------------------------------------------------------------------------------------ No results for input(s): TSH, T4TOTAL, T3FREE, THYROIDAB in the last 72 hours.  Invalid input(s): FREET3 ------------------------------------------------------------------------------------------------------------------ No results for input(s): VITAMINB12, FOLATE, FERRITIN, TIBC, IRON, RETICCTPCT in the last 72 hours.  Coagulation profile No results for input(s): INR, PROTIME in the last 168 hours. No results for input(s): DDIMER in the last 72 hours.  Cardiac Enzymes No results for input(s): CKTOTAL, CKMB, CKMBINDEX, TROPONINI in the last 168  hours.  ------------------------------------------------------------------------------------------------------------------ No results found for: BNP   Antibiotics: Anti-infectives (From admission, onward)   Start     Dose/Rate Route Frequency Ordered Stop   01/27/21 2200  cefTAZidime (FORTAZ) 2 g in sodium chloride 0.9 % 100 mL IVPB        2 g 200 mL/hr over 30 Minutes Intravenous Every 12 hours 01/27/21 1403 02/03/21 2247   01/27/21 1500  cefTAZidime (FORTAZ) 2 g in sodium chloride 0.9 % 100 mL IVPB  Status:  Discontinued        2 g 200 mL/hr over 30 Minutes Intravenous Every 12 hours 01/27/21 1402 01/27/21 1403   01/25/21 2200  cefTRIAXone (ROCEPHIN) 1 g in sodium chloride 0.9 % 100 mL IVPB  Status:  Discontinued       Note to Pharmacy: Dose was documented as given earlier this evening but IV became disconnected at some point and RN thinks he may not have received the dose   1 g 200 mL/hr over 30 Minutes Intravenous Every 24 hours 01/25/21 2107 01/27/21 1402   01/25/21 1830  cefTRIAXone (ROCEPHIN) 1 g in sodium chloride 0.9 % 100 mL IVPB  Status:  Discontinued        1 g 200 mL/hr over 30 Minutes Intravenous Every 24 hours 01/25/21 1740 01/25/21 2107   01/13/21 1400  piperacillin-tazobactam (ZOSYN) IVPB 3.375 g  Status:  Discontinued        3.375 g 12.5 mL/hr over 240 Minutes Intravenous Every 8 hours 01/13/21 1030 01/15/21 0813   01/13/21 1200  piperacillin-tazobactam (ZOSYN) IVPB 3.375 g  Status:  Discontinued        3.375 g 100 mL/hr over 30 Minutes Intravenous Every 6 hours 01/13/21 0955 01/13/21 1030   01/13/21 1045  ceFEPIme (MAXIPIME) 2 g in sodium chloride 0.9 % 100 mL IVPB  Status:  Discontinued        2 g 200 mL/hr over 30 Minutes Intravenous Every 12 hours 01/13/21 0949 01/13/21 0954   01/08/21 1100  cefTRIAXone (ROCEPHIN) 2 g in sodium chloride 0.9 % 100 mL IVPB  Status:  Discontinued        2 g 200 mL/hr over 30 Minutes Intravenous Every 24 hours 01/07/21 1244 01/10/21  1012   01/08/21 0600  vancomycin (VANCOREADY) IVPB 1250 mg/250 mL  Status:  Discontinued        1,250 mg 166.7 mL/hr over 90 Minutes Intravenous Every 48 hours 01/06/21  2202 01/08/21 1010   01/07/21 1108  ceFEPIme (MAXIPIME) 2 g in sodium chloride 0.9 % 100 mL IVPB  Status:  Discontinued        2 g 200 mL/hr over 30 Minutes Intravenous Every 24 hours 01/06/21 2202 01/07/21 1244   01/06/21 1200  vancomycin (VANCOREADY) IVPB 1000 mg/200 mL  Status:  Discontinued        1,000 mg 200 mL/hr over 60 Minutes Intravenous Every 24 hours 01/05/21 2209 01/06/21 2202   01/06/21 1200  ceFEPIme (MAXIPIME) 2 g in sodium chloride 0.9 % 100 mL IVPB  Status:  Discontinued        2 g 200 mL/hr over 30 Minutes Intravenous Every 12 hours 01/05/21 2209 01/06/21 2202   01/04/21 1400  ceFEPIme (MAXIPIME) 2 g in sodium chloride 0.9 % 100 mL IVPB        2 g 200 mL/hr over 30 Minutes Intravenous Every 8 hours 01/04/21 1335 01/05/21 2231   01/04/21 1000  ceFEPIme (MAXIPIME) 2 g in sodium chloride 0.9 % 100 mL IVPB  Status:  Discontinued        2 g 200 mL/hr over 30 Minutes Intravenous Every 12 hours 01/03/21 2131 01/04/21 1335   01/04/21 0600  vancomycin (VANCOREADY) IVPB 750 mg/150 mL  Status:  Discontinued        750 mg 150 mL/hr over 60 Minutes Intravenous Every 12 hours 01/03/21 1436 01/05/21 2209   01/03/21 2200  vancomycin (VANCOREADY) IVPB 750 mg/150 mL  Status:  Discontinued        750 mg 150 mL/hr over 60 Minutes Intravenous Every 12 hours 01/03/21 1241 01/03/21 1436   01/03/21 1400  ceFEPIme (MAXIPIME) 2 g in sodium chloride 0.9 % 100 mL IVPB  Status:  Discontinued        2 g 200 mL/hr over 30 Minutes Intravenous Every 8 hours 01/03/21 1238 01/03/21 2131   01/03/21 1330  vancomycin (VANCOREADY) IVPB 1250 mg/250 mL        1,250 mg 166.7 mL/hr over 90 Minutes Intravenous  Once 01/03/21 1241 01/03/21 1640       Radiology Reports  No results found.    DVT prophylaxis: Lovenox  Code Status: Full  code  Family Communication: No family at bedside   Consultants:  PCCM  Cardiology  Nephrology  Procedures:  Intubation  Cardiac catheterization  CRRT    Objective    Physical Examination:    General-appears in no acute distress  Heart-S1-S2, regular, no murmur auscultated  Lungs-clear to auscultation bilaterally, no wheezing or crackles auscultated  Abdomen-soft, nontender, no organomegaly  Extremities-no edema in the lower extremities  Neuro-alert, oriented x3, no focal deficit noted   Status is: Inpatient  Dispo: The patient is from: Home              Anticipated d/c is to: Skilled nursing facility versus inpatient rehab              Anticipated d/c date is: 02/05/2021              Patient currently  stable for discharge  Barrier to discharge-awaiting bed at skilled nursing facility  COVID-19 Labs  No results for input(s): DDIMER, FERRITIN, LDH, CRP in the last 72 hours.  Lab Results  Component Value Date   [place] NEGATIVE 01/03/2021    Microbiology  Recent Results (from the past 240 hour(s))  Cath Tip Culture     Status: Abnormal   Collection Time: 01/25/21  8:00 PM  Specimen: Catheter Tip; Other  Result Value Ref Range Status   Specimen Description CATH TIP  Final   Special Requests   Final    NONE Performed at [facility], [place] [address] [place], [place] [address]    Culture 3,000 COLONIES/mL STAPHYLOCOCCUS EPIDERMIDIS (A)  Final   Report Status 01/30/2021 FINAL  Final   Organism ID, Bacteria STAPHYLOCOCCUS EPIDERMIDIS (A)  Final      Susceptibility   Staphylococcus epidermidis - MIC*    CIPROFLOXACIN <=0.5 SENSITIVE Sensitive     ERYTHROMYCIN <=0.25 SENSITIVE Sensitive     GENTAMICIN <=0.5 SENSITIVE Sensitive     OXACILLIN >=4 RESISTANT Resistant     TETRACYCLINE 2 SENSITIVE Sensitive     VANCOMYCIN 1 SENSITIVE Sensitive     TRIMETH/SULFA <=10 SENSITIVE Sensitive     CLINDAMYCIN <=0.25 SENSITIVE Sensitive      RIFAMPIN <=0.5 SENSITIVE Sensitive     Inducible Clindamycin NEGATIVE Sensitive     * 3,000 COLONIES/mL STAPHYLOCOCCUS EPIDERMIDIS    Pressure Injury 01/20/21 Heel Left Deep Tissue Pressure Injury - Purple or maroon localized area of discolored intact skin or blood-filled blister due to damage of underlying soft tissue from pressure and/or shear. (Active)  01/20/21 0100  Location: Heel  Location Orientation: Left  Staging: Deep Tissue Pressure Injury - Purple or maroon localized area of discolored intact skin or blood-filled blister due to damage of underlying soft tissue from pressure and/or shear.  Wound Description (Comments):   Present on Admission: No       [facility] If 7PM-7AM, please contact night-coverage at [email or web address], Office  [phone]   02/04/2021, 2:59 PM  LOS: 32 days

## 2021-02-05 DIAGNOSIS — R570 Cardiogenic shock: Secondary | ICD-10-CM | POA: Diagnosis not present

## 2021-02-05 DIAGNOSIS — J9601 Acute respiratory failure with hypoxia: Secondary | ICD-10-CM | POA: Diagnosis not present

## 2021-02-05 DIAGNOSIS — N170 Acute kidney failure with tubular necrosis: Secondary | ICD-10-CM | POA: Diagnosis not present

## 2021-02-05 LAB — GLUCOSE, CAPILLARY
Glucose-Capillary: 112 mg/dL — ABNORMAL HIGH (ref 70–99)
Glucose-Capillary: 109 mg/dL — ABNORMAL HIGH (ref 70–99)
Glucose-Capillary: 125 mg/dL — ABNORMAL HIGH (ref 70–99)
Glucose-Capillary: 122 mg/dL — ABNORMAL HIGH (ref 70–99)

## 2021-02-05 LAB — BASIC METABOLIC PANEL
Anion gap: 8 (ref 5–15)
BUN: 25 mg/dL — ABNORMAL HIGH (ref 8–23)
CO2: 19 mmol/L — ABNORMAL LOW (ref 22–32)
Calcium: 8.2 mg/dL — ABNORMAL LOW (ref 8.9–10.3)
Chloride: 109 mmol/L (ref 98–111)
Creatinine, Ser: 1.82 mg/dL — ABNORMAL HIGH (ref 0.61–1.24)
GFR, Estimated: 39 mL/min — ABNORMAL LOW (ref >60)
Glucose, Bld: 95 mg/dL (ref 70–99)
Potassium: 3.6 mmol/L (ref 3.5–5.1)
Sodium: 136 mmol/L (ref 135–145)

## 2021-02-05 LAB — MAGNESIUM: Magnesium: 1.7 mg/dL (ref 1.7–2.4)

## 2021-02-05 MED ORDER — ALPRAZOLAM 0.5 MG PO TABS
0.5000 mg | ORAL_TABLET | Freq: Every evening | ORAL | Status: DC | PRN
  Administered 2021-02-05 – 2021-02-06 (×2): 0.5 mg via ORAL
  Filled 2021-02-05 (×2): qty 1

## 2021-02-05 NOTE — NC FL2 (Signed)
[place] LEVEL OF CARE SCREENING TOOL     IDENTIFICATION  Patient Name: [name] Birthdate: 1951-06-17 Sex: male Admission Date (Current Location): 01/03/2021  [facility] and [place] Number:  Herbalist and Address:  [facility] [place], [place] [address], [place], [place] [address]      Provider Number: [id number]  Attending Physician Name and Address:  Oswald Hillock, MD  Relative Name and Phone Number:  Reyne Dumas [phone]    Current Level of Care: Hospital Recommended Level of Care: [place] Prior Approval Number:    Date Approved/Denied:   PASRR Number: [id number]  Discharge Plan: SNF    Current Diagnoses: Patient Active Problem List   Diagnosis Date Noted  . Hypertension   . Hemoptysis   . Acute hypoxemic respiratory failure ([place])   . Acute renal failure ([place])   . Encephalopathy acute   . Cardiogenic shock ([place]) 01/03/2021  . Atypical chest pain 10/25/2019  . Hyperglycemia 10/12/2019  . Coronary artery calcification seen on CAT scan 06/15/2019  . Nodule of lower lobe of left lung 06/15/2019  . Tremor of right hand 04/24/2017  . Numbness and tingling of both legs 04/24/2017  . Bilateral leg edema 06/05/2015  . Chronic neck pain 06/05/2015  . Plantar fasciitis 02/01/2014  . Visual floaters 04/04/2013  . Subjective vision disturbance, left 04/04/2013  . 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 MELANOMA, HX OF 06/01/2009  . NECK PAIN 09/11/2008  . CELLULITIS AND ABSCESS OF LEG EXCEPT FOOT 04/26/2008  . ERECTILE DYSFUNCTION 10/12/2007  . Hyperlipemia, mixed 04/12/2007  . Anxiety state 04/12/2007  . Essential hypertension 04/12/2007  . GERD 04/12/2007    Orientation RESPIRATION BLADDER Height & Weight     Time,Self,Situation,Place  Normal Continent Weight: 177 lb 1.6 oz (80.3 kg) Height:  6\' 1"  (185.4 cm)  BEHAVIORAL SYMPTOMS/MOOD NEUROLOGICAL BOWEL NUTRITION STATUS      Continent  Diet (see d/c summary)  AMBULATORY STATUS COMMUNICATION OF NEEDS Skin   Limited Assist Verbally Normal                       Personal Care Assistance Level of Assistance  Feeding,Bathing,Dressing Bathing Assistance: Limited assistance Feeding assistance: Independent (able to feed self) Dressing Assistance: Limited assistance     Functional Limitations Info  Sight,Hearing,Speech Sight Info: Impaired (left and right edema) Hearing Info: Adequate Speech Info: Adequate    SPECIAL CARE FACTORS FREQUENCY  PT (By licensed PT),OT (By licensed OT)     PT Frequency: 5 X per week OT Frequency: 5 X per week            Contractures Contractures Info: Not present    Additional Factors Info  Code Status,Allergies Code Status Info: DNR Allergies Info: Codeine, Lisinopril           Current Medications (02/05/2021):  This is the current hospital active medication list Current Facility-Administered Medications  Medication Dose Route Frequency Provider Last Rate Last Admin  . acetaminophen (TYLENOL) tablet 650 mg  650 mg Oral Q6H PRN Oswald Hillock, MD   650 mg at 02/04/21 2122  . aspirin chewable tablet 81 mg  81 mg Oral Daily Cala Bradford, RPH   81 mg at 02/05/21 1006  . atropine 1 % ophthalmic solution 4 drop  4 drop Sublingual QID PRN Mikhail, Velta Addison, DO      . enoxaparin (LOVENOX) injection 40 mg  40 mg Subcutaneous Q24H Pokhrel,  Laxman, MD   40 mg at 02/05/21 1008  . feeding supplement (BOOST / RESOURCE BREEZE) liquid 1 Container  1 Container Oral TID BM Oswald Hillock, MD   1 Container at 02/05/21 1007  . hydrALAZINE (APRESOLINE) tablet 25 mg  25 mg Oral Q8H Cala Bradford, RPH   25 mg at 02/05/21 6754  . insulin aspart (novoLOG) injection 0-9 Units  0-9 Units Subcutaneous TID AC & HS Pokhrel, Laxman, MD   1 Units at 02/03/21 1737  . ipratropium-albuterol (DUONEB) 0.5-2.5 (3) MG/3ML nebulizer solution 3 mL  3 mL Nebulization Q4H PRN Bensimhon, Shaune Pascal, MD   3 mL at 01/19/21  1752  . isosorbide dinitrate (ISORDIL) tablet 10 mg  10 mg Oral TID Cala Bradford, RPH   10 mg at 02/05/21 1007  . losartan (COZAAR) tablet 25 mg  25 mg Oral Daily Einar Grad, RPH   25 mg at 02/05/21 1007  . modafinil (PROVIGIL) tablet 100 mg  100 mg Oral Daily Cala Bradford, RPH   100 mg at 02/05/21 1007  . ondansetron (ZOFRAN) injection 4 mg  4 mg Intravenous Q6H PRN Clegg, Amy D, NP      . pantoprazole sodium (PROTONIX) 40 mg/20 mL oral suspension 40 mg  40 mg Oral Daily Cala Bradford, RPH   40 mg at 02/05/21 1006  . rosuvastatin (CRESTOR) tablet 10 mg  10 mg Oral Daily Cala Bradford, RPH   10 mg at 02/05/21 1006  . sodium bicarbonate tablet 650 mg  650 mg Oral BID Oswald Hillock, MD   650 mg at 02/05/21 1006  . spironolactone (ALDACTONE) tablet 12.5 mg  12.5 mg Oral Daily Katherine Roan, MD   12.5 mg at 02/05/21 1006  . ticagrelor (BRILINTA) tablet 90 mg  90 mg Per Tube BID Audria Nine, DO   90 mg at 02/05/21 1006  . Zinc Oxide (TRIPLE PASTE) 12.8 % ointment   Topical PRN Pershing Proud, NP         Discharge Medications: Please see discharge summary for a list of discharge medications.  Relevant Imaging Results:  Relevant Lab Results:   Additional Information SS#: [id number] [place] COVID-19 Vaccine 07/07/2020 , 11/26/2019 , 10/31/2019  Sharra Cayabyab, LCSWA

## 2021-02-05 NOTE — Progress Notes (Signed)
Physical Therapy Treatment Patient Details Name: [name] MRN: [id number] DOB: 1951-01-05 Today's Date: 02/05/2021    History of Present Illness Pt is 70 yo admitted 4/14 with chest pain and STEMI with bradycardia and hypotension. Pt taken for urgent cath with temporary pacer placed but pt decompensated to cardiac arrest with intubation and CPR. Stent placed and pt with further complication of cardiogenic shock with Impella placed. 4/18 seizure like activity, Impella removed 4/20, 4/20 MRI with scattered micro embolic infarctions. Extubated 4/24 and reintubated 2 hrs later. Extubated 4/26. Reintubated 01/20/21. Extubated 01/23/21.  PMHx: HTN, HLD, CAD    PT Comments    Patient progressing well towards PT goals. Today's session focused on balance and gait training without use of DME for support. Pt tolerated head turns, changes in direction and stepping over objects with mild deviations in gait but no overt LOB. Continues to fatigue requiring seated and standing rest breaks. Noted to have worsening balance when challenged cognitively as well with more drifting/staggering present. VSS on RA. Continue to recommend use of RW when mobilizing with nursing/cardiac rehab for safety. Will plan to perform full DGI next session as tolerated while incorporating cognitive tasks to prepare pt for functional independence. Continues to be appropriate for post acute rehab.    Follow Up Recommendations  SNF     Equipment Recommendations  Rolling walker with 5" wheels    Recommendations for Other Services       Precautions / Restrictions Precautions Precautions: Fall;Other (comment) Precaution Comments: expressive and receptive aphasia qualities Restrictions Weight Bearing Restrictions: No    Mobility  Bed Mobility Overal bed mobility: Modified Independent Bed Mobility: Supine to Sit;Sit to Supine     Supine to sit: Modified independent (Device/Increase time) Sit to supine: Modified independent  (Device/Increase time)   General bed mobility comments: HOB flat, no assist needed.    Transfers Overall transfer level: Needs assistance Equipment used: None Transfers: Sit to/from Stand Sit to Stand: Supervision         General transfer comment: Supervision for safety  Ambulation/Gait Ambulation/Gait assistance: Min guard Gait Distance (Feet): 200 Feet (+ 200') Assistive device: None Gait Pattern/deviations: Step-through pattern;Drifts right/left Gait velocity: reduced   General Gait Details: Slow, mildly unsteady gait without use of DME; noted to have some drifting esp with balance challenges and head turns but no overt LOB. 1 standing rest break and 1 seated rest break.   Stairs             Wheelchair Mobility    Modified Rankin (Stroke Patients Only) Modified Rankin (Stroke Patients Only) Pre-Morbid Rankin Score: No symptoms Modified Rankin: Moderately severe disability     Balance Overall balance assessment: Needs assistance Sitting-balance support: Feet supported;No upper extremity supported Sitting balance-Leahy Scale: Good     Standing balance support: During functional activity Standing balance-Leahy Scale: Fair               High level balance activites: Head turns;Sudden stops;Direction changes High Level Balance Comments: Tolerated above with mild deviations in gait but no overt LOB. Able to step over objects with close min guard.            Cognition Arousal/Alertness: Awake/alert Behavior During Therapy: WFL for tasks assessed/performed Overall Cognitive Status: Impaired/Different from baseline Area of Impairment: Memory;Following commands;Attention;Problem solving                     Memory: Decreased short-term memory Following Commands: Follows one step commands consistently  Problem Solving: Requires verbal cues;Difficulty sequencing General Comments: Doing his best to recall events happening, feeling slightly  overwhelmed. Does not recall OT coming in today until told what they did together but can state other 2 people who visited.      Exercises      General Comments General comments (skin integrity, edema, etc.): VSS on RA.      Pertinent Vitals/Pain Pain Assessment: No/denies pain Pain Score: 0-No pain Faces Pain Scale: No hurt Pain Intervention(s): Monitored during session    Home Living                      Prior Function            PT Goals (current goals can now be found in the care plan section) Acute Rehab PT Goals Patient Stated Goal: To get better and go home. Progress towards PT goals: Progressing toward goals    Frequency    Min 3X/week      PT Plan Current plan remains appropriate    Co-evaluation              AM-PAC PT "6 Clicks" Mobility   Outcome Measure  Help needed turning from your back to your side while in a flat bed without using bedrails?: None Help needed moving from lying on your back to sitting on the side of a flat bed without using bedrails?: None Help needed moving to and from a bed to a chair (including a wheelchair)?: A Little Help needed standing up from a chair using your arms (e.g., wheelchair or bedside chair)?: A Little Help needed to walk in hospital room?: A Little Help needed climbing 3-5 steps with a railing? : A Little 6 Click Score: 20    End of Session Equipment Utilized During Treatment: Gait belt Activity Tolerance: Patient tolerated treatment well Patient left: in bed;with call bell/phone within reach;with bed alarm set Nurse Communication: Mobility status PT Visit Diagnosis: Other abnormalities of gait and mobility (R26.89);Muscle weakness (generalized) (M62.81);Difficulty in walking, not elsewhere classified (R26.2);Unsteadiness on feet (R26.81) Hemiplegia - Right/Left: Right Hemiplegia - dominant/non-dominant: Non-dominant Hemiplegia - caused by: Cerebral infarction     Time: 4174-0814 PT Time  Calculation (min) (ACUTE ONLY): 15 min  Charges:  $Gait Training: 8-22 mins                     Marisa Severin, PT, DPT Acute Rehabilitation Services Pager [phone] Office [place] 02/05/2021, 3:55 PM

## 2021-02-05 NOTE — TOC Progression Note (Addendum)
Transition of Care (TOC) - Progression Note  Heart Failure   Patient Details  Name: [name] MRN: [id number] Date of Birth: 1951/04/11  Transition of Care [facility]) CM/SW [place], [place] Phone Number: 02/05/2021, 10:13 AM  Clinical Narrative:    CSW spoke with the patients HCPOA Mr. [name] [phone] regarding SNF bed offers to see if he has decided on a facility for rehab. Mr. Kellie Simmering reported that he would like to talk with the patient first to see what he thinks before making a decision. CSW did bring the patient a medicare rating list for both [place] however do to patients anoxic brain injury he was unable to communicate with the CSW. Mr. Kellie Simmering reported he will get back to the CSW this afternoon or tomorrow regarding SNF placement. 10:50am - CSW spoke with the patient at bedside he was alert and oriented and discussed with him the 11 bed offers and again brought him the medicare rating list and discussed the importance of picking a facility today. Patient reported he would like the help of his HCPOA [name] in making the decision. The patient also asked about [facility] and [facility] both of which did not extend a bed offer. CSW called [facility] and unfortunately the patients health insurance is not in network with the facility. CSW called [facility] and spoke with Alessandra Bevels [phone] who reported that they are at full capacity and do not have any beds available at this time. CSW informed the patient of this information and patient asked if [facility] was a possibility as well and CSW reported that they will call [facility] and ask if they have and beds available and will let the patient know. Per attending MD and treatment team the patient and HCPOA need to pick a facility today and CSW informed the patient of this and he is aware. 2:05pm - CSW spoke with Perrin Smack at [place] [phone] who reported that she does have a bed available for Mr. Dyar and  that she just needs an updated FL2 with the changes in improvement for the patient. CSW sent [facility] the updated FL2 information and [facility] extended an offer in the hub. 2:45pm - CSW attempted to speak with the patient at bedside to inform him about [facility] extending a bed offer however Juliann Pulse from [employer] was in speaking with the patient.  2:55pm - CSW spoke with the patient's HCPOA [name] and informed him that [facility] extended a bed offer as well. HCPOA reported he will get back with the CSW as soon as possible about a SNF choice.  TOC will continue to follow for discharge needs.    Expected Discharge Plan: [place] Barriers to Discharge: Continued Medical Work up  Expected Discharge Plan and Services Expected Discharge Plan: [place] In-house Referral: Clinical Social Work Discharge Planning Services: CM Consult Post Acute Care Choice: [place] arrangements for the past 2 months: Single Family Home                                       Social Determinants of Health (SDOH) Interventions    Readmission Risk Interventions No flowsheet data found.  Jhostin Epps, MSW, [place] Heart Failure Social Worker

## 2021-02-05 NOTE — Progress Notes (Signed)
Occupational Therapy Treatment Patient Details Name: [name] MRN: [id number] DOB: [birth year] Today's Date: 02/05/2021    History of present illness Pt is 70 yo admitted 4/14 with chest pain and STEMI with bradycardia and hypotension. Pt taken for urgent cath with temporary pacer placed but pt decompensated to cardiac arrest with intubation and CPR. Stent placed and pt with further complication of cardiogenic shock with Impella placed. 4/18 seizure like activity, Impella removed 4/20, 4/20 MRI with scattered micro embolic infarctions. Extubated 4/24 and reintubated 2 hrs later. Extubated 4/26. Reintubated 01/20/21. Extubated 01/23/21.  PMHx: HTN, HLD, CAD   OT comments  Pt making good progress with functional goals with some cognitive improvement noted. Session focused on getting OOB, standing with RW for ADL functional mobility to bathroom for toilet transfers, toileting tasks, standing at sink with RW for hygiene and LB ADLs. OT will continue to follow acutely to maximize level of function and safety  Follow Up Recommendations  SNF    Equipment Recommendations  3 in 1 bedside commode;Other (comment) (TBD at SNF)    Recommendations for Other Services      Precautions / Restrictions Precautions Precautions: Fall;Other (comment) Precaution Comments: expressive and receptive aphasia qualities Restrictions Weight Bearing Restrictions: No       Mobility Bed Mobility Overal bed mobility: Needs Assistance Bed Mobility: Supine to Sit     Supine to sit: Supervision     General bed mobility comments: Supervision for safety. No assist needed, no dizziness.    Transfers Overall transfer level: Needs assistance Equipment used: Rolling walker (2 wheeled) Transfers: Sit to/from Stand Sit to Stand: Supervision         General transfer comment: Supervision for safety    Balance Overall balance assessment: Needs assistance Sitting-balance support: No upper extremity  supported;Feet supported Sitting balance-Leahy Scale: Good     Standing balance support: During functional activity Standing balance-Leahy Scale: Fair                             ADL either performed or assessed with clinical judgement   ADL Overall ADL's : Needs assistance/impaired     Grooming: Wash/dry hands;Wash/dry face;Min guard;Standing       Lower Body Bathing: Minimal assistance;Cueing for safety Lower Body Bathing Details (indicate cue type and reason): simulated seated and standing at RW     Lower Body Dressing: Sit to/from stand;Minimal assistance;Sitting/lateral leans   Toilet Transfer: RW;Ambulation;Comfort height toilet;Grab bars;Cueing for safety;Supervision/safety   Toileting- Clothing Manipulation and Hygiene: Min guard;Sit to/from stand       Functional mobility during ADLs: Rolling walker;Cueing for sequencing;Cueing for safety;Supervision/safety       Vision Baseline Vision/History: Wears glasses Wears Glasses: At all times Patient Visual Report: No change from baseline     Perception     Praxis      Cognition Arousal/Alertness: Awake/alert Behavior During Therapy: WFL for tasks assessed/performed Overall Cognitive Status: Impaired/Different from baseline Area of Impairment: Memory;Following commands;Attention;Problem solving                     Memory: Decreased short-term memory Following Commands: Follows one step commands consistently     Problem Solving: Requires verbal cues;Difficulty sequencing General Comments: Seems to have better awareness of safety/deficits, feels better using RW. Asking appropriate questions regarding d/c plans.        Exercises     Shoulder Instructions       General Comments  Pertinent Vitals/ Pain       Pain Assessment: No/denies pain Pain Score: 0-No pain Faces Pain Scale: No hurt Pain Intervention(s): Monitored during session  Home Living                                           Prior Functioning/Environment              Frequency  Min 2X/week        Progress Toward Goals  OT Goals(current goals can now be found in the care plan section)  Progress towards OT goals: Progressing toward goals  Acute Rehab OT Goals Patient Stated Goal: To get better and go home. OT Goal Formulation: With patient  Plan Discharge plan remains appropriate    Co-evaluation                 AM-PAC OT "6 Clicks" Daily Activity     Outcome Measure   Help from another person eating meals?: None Help from another person taking care of personal grooming?: A Little Help from another person toileting, which includes using toliet, bedpan, or urinal?: A Little Help from another person bathing (including washing, rinsing, drying)?: A Little Help from another person to put on and taking off regular upper body clothing?: A Little Help from another person to put on and taking off regular lower body clothing?: A Little 6 Click Score: 19    End of Session Equipment Utilized During Treatment: Gait belt;Rolling walker  OT Visit Diagnosis: Muscle weakness (generalized) (M62.81);Other abnormalities of gait and mobility (R26.89);Adult, failure to thrive (R62.7);Other symptoms and signs involving cognitive function Symptoms and signs involving cognitive functions: Cerebral infarction Hemiplegia - Right/Left: Right Hemiplegia - dominant/non-dominant: Dominant Hemiplegia - caused by: Cerebral infarction   Activity Tolerance Patient tolerated treatment well   Patient Left with call bell/phone within reach;in chair;with family/visitor present;with chair alarm set   Nurse Communication          Time: 6269-4854 OT Time Calculation (min): 28 min  Charges: OT General Charges $OT Visit: 1 Visit OT Treatments $Self Care/Home Management : 8-22 mins $Therapeutic Activity: 8-22 mins     Britt Bottom 02/05/2021, 2:59 PM

## 2021-02-05 NOTE — Progress Notes (Signed)
[facility]  PROGRESS NOTE  [name] QIW:[id number] DOB: 11/17/1950 DOA: 01/03/2021 PCP: [name], MD   Brief HPI:   70 year old male with past medical history of hypertension, hyperlipidemia, CAD who was admitted with ST relation MI and cardiac arrest prior to coming to hospital.  He underwent emergent cardiac catheterization and PCI with stent to proximal RCA.  Ejection fraction was noted to be 25 to 30%.  Patient did have prolonged ICU stay for cardiogenic/hemorrhagic shock requiring vasopressors and transfusion.  He suffered from diminished mental status and MRI showed scattered punctate infarction concerning for anoxic injury.  Also had acute kidney injury and required CRRT and was subsequently transitioned to intermittent hemodialysis.  Patient was extubated on 01/15/2021 and reintubated on 01/20/2021.  Subsequently was extubated on 01/23/2021 and was transferred out of ICU.  Patient's mental status has significantly improved.  Palliative care was consulted and healthcare power of attorney decided not to escalate care and made him DNR.  Nephrology and cardiology have been following the patient.  Significant Events: 4/20 Impella(heart pump) removed. MRI brain with numerous micro embolic infarctions. Off pressors.  4/21 lasix drip stopped.  4/22 started CVVH 4/24 CVVH stopped. Failed extubation. Re-intubated.  4/26 Extubated  5/1 Re-intubated 5/4 Extubated, off pressors    Subjective   Patient seen and examined, denies chest pain or shortness of breath.   Assessment/Plan:     1. CAD/acute inferior STEMI-patient underwent emergent cardiac catheterization with PCI and drug-eluting stent to right coronary artery.  Continue DAPT, statin.  Patient not ARB due to acute kidney injury.  Continue hydralazine and nitrate.  2. Acute systolic heart failure/cardiogenic shock-patient's EF was found to be 25 to 30%, he underwent CRRT.  Currently hemodialysis has been stopped.  Patient  slowly improving.  Continue hydralazine, isosorbide, spironolactone.  3. S/p ventricular fibrillation arrest-s/p anoxic brain injury.  Cognition has improved.  Patient current status is DNR.  4. Fever with leukocytosis-likely secondary to pseudomonal UTI.  Hemodialysis catheter was removed including Foley catheter.  UA showed some pyuria.  Urine culture grew Pseudomonas.  Patient was initially started on Rocephin which was changed to ceftazidime.  Hemodialysis catheter tip culture showed 3000 colonies of staph epidermidis, MRSE.  Likely contamination.  No need to treat as patient is improving.  Blood cultures x2 are negative to date.  Chest x-ray showed minimal bibasilar atelectasis.  Fever has resolved.  WBC is 12,600 today.  5. Nonoliguric AKI-likely from ATN and cardiogenic shock.  Creatinine is slowly improving, today creatinine is 1.82 today.     No further hemodialysis plan per nephrology.  HD catheter has been removed.  6. Hypomagnesemia-replete, repeat magnesium is 1.7  7. Non-anion gap metabolic acidosis-continue sodium bicarb tablets 650 mg p.o. twice daily.  CO2 improved to 19.  Follow BMP in am  8. Anoxic brain injury-MRI brain showed microembolic infarcts and EEG showed triphasic morphology with diffuse encephalopathy.  Patient has alert and communicating at this time.  Significantly improved.  Continue Provigil.  9. Hypernatremia-resolved, sodium is 136 today.  10. Nutrition-core track tube was inserted, unfortunately patient pulled out core track tube   Palliative care had discussion with patient's [facility] POA who did not want PEG tube .plan was to continue core track tube feeding as long as possible.  As patient has pulled out core track feeding tube, and is eating well.  Patient's p.o. intake has significantly improved.  Continue regular diet.       Scheduled medications:   . aspirin  81 mg Oral Daily  . enoxaparin (LOVENOX) injection  40 mg Subcutaneous Q24H  . feeding supplement   1 Container Oral TID BM  . hydrALAZINE  25 mg Oral Q8H  . insulin aspart  0-9 Units Subcutaneous TID AC & HS  . isosorbide dinitrate  10 mg Oral TID  . losartan  25 mg Oral Daily  . modafinil  100 mg Oral Daily  . pantoprazole sodium  40 mg Oral Daily  . rosuvastatin  10 mg Oral Daily  . sodium bicarbonate  650 mg Oral BID  . spironolactone  12.5 mg Oral Daily  . ticagrelor  90 mg Per Tube BID         Data Reviewed:   CBG:  Recent Labs  Lab 02/04/21 1112 02/04/21 1611 02/04/21 2103 02/05/21 0554 02/05/21 1113  GLUCAP 92 107* 103* 112* 109*    SpO2: 100 % O2 Flow Rate (L/min): 2 L/min FiO2 (%): 30 %    Vitals:   02/05/21 0410 02/05/21 0850 02/05/21 1117 02/05/21 1430  BP: 132/72 127/70 103/66 118/66  Pulse: 76 78 87   Resp: 16     Temp: (!) 97.4 F (36.3 C) 98 F (36.7 C) 98.1 F (36.7 C)   TempSrc: Oral Oral Oral   SpO2: 98% 98% 100%   Weight:      Height:         Intake/Output Summary (Last 24 hours) at 02/05/2021 1456 Last data filed at 02/05/2021 1401 Gross per 24 hour  Intake 1434 ml  Output 1800 ml  Net -366 ml    05/15 1901 - 05/17 0700 In: 1878 [P.O.:1878] Out: 2875 [Urine:2875]  Filed Weights   02/03/21 0500 02/04/21 0324 02/05/21 0300  Weight: 79.7 kg 79.7 kg 80.3 kg    CBC:  Recent Labs  Lab 01/30/21 0303 01/31/21 0316 02/01/21 0935 02/03/21 0307  WBC 16.4* 14.6* 10.6* 12.6*  HGB 8.2* 7.4* 8.1* 8.2*  HCT 25.9* 23.0* 24.8* 25.8*  PLT 231 212 196 185  MCV 94.9 96.2 98.0 95.9  MCH 30.0 31.0 32.0 30.5  MCHC 31.7 32.2 32.7 31.8  RDW 16.7* 17.1* 17.3* 17.7*    Complete metabolic panel:  Recent Labs  Lab 01/31/21 0316 02/01/21 0325 02/02/21 0259 02/03/21 0307 02/04/21 0639 02/05/21 0345  NA 136 136 136 136 137 136  K 3.6 4.2 3.9 3.5 3.5 3.6  CL 109 112* 109 110 108 109  CO2 18* 16* 17* 17* 20* 19*  GLUCOSE 110* 97 103* 89 111* 95  BUN 57* 50* 41* 32* 26* 25*  CREATININE 1.96* 1.82* 1.90* 1.70* 1.76* 1.82*   CALCIUM 8.1* 8.2* 8.5* 8.5* 8.5* 8.2*  AST 26  --   --  18  --   --   ALT 83*  --   --  45*  --   --   ALKPHOS 81  --   --  75  --   --   BILITOT 0.4  --   --  0.9  --   --   ALBUMIN 2.2*  --   --  2.6*  --   --   MG 1.8 1.9 1.7 1.6* 2.0 1.7    No results for input(s): LIPASE, AMYLASE in the last 168 hours.  No results for input(s): CRP, DDIMER, BNP, PROCALCITON, SARSCOV2NAA in the last 168 hours.  Invalid input(s): LACTICACID  ------------------------------------------------------------------------------------------------------------------ No results for input(s): CHOL, HDL, LDLCALC, TRIG, CHOLHDL, LDLDIRECT in the last 72 hours.  Lab Results  Component Value  Date   HGBA1C 5.7 (H) 01/06/2021   ------------------------------------------------------------------------------------------------------------------ No results for input(s): TSH, T4TOTAL, T3FREE, THYROIDAB in the last 72 hours.  Invalid input(s): FREET3 ------------------------------------------------------------------------------------------------------------------ No results for input(s): VITAMINB12, FOLATE, FERRITIN, TIBC, IRON, RETICCTPCT in the last 72 hours.  Coagulation profile No results for input(s): INR, PROTIME in the last 168 hours. No results for input(s): DDIMER in the last 72 hours.  Cardiac Enzymes No results for input(s): CKTOTAL, CKMB, CKMBINDEX, TROPONINI in the last 168 hours.  ------------------------------------------------------------------------------------------------------------------ No results found for: BNP   Antibiotics: Anti-infectives (From admission, onward)   Start     Dose/Rate Route Frequency Ordered Stop   01/27/21 2200  cefTAZidime (FORTAZ) 2 g in sodium chloride 0.9 % 100 mL IVPB        2 g 200 mL/hr over 30 Minutes Intravenous Every 12 hours 01/27/21 1403 02/03/21 2247   01/27/21 1500  cefTAZidime (FORTAZ) 2 g in sodium chloride 0.9 % 100 mL IVPB  Status:  Discontinued         2 g 200 mL/hr over 30 Minutes Intravenous Every 12 hours 01/27/21 1402 01/27/21 1403   01/25/21 2200  cefTRIAXone (ROCEPHIN) 1 g in sodium chloride 0.9 % 100 mL IVPB  Status:  Discontinued       Note to Pharmacy: Dose was documented as given earlier this evening but IV became disconnected at some point and RN thinks he may not have received the dose   1 g 200 mL/hr over 30 Minutes Intravenous Every 24 hours 01/25/21 2107 01/27/21 1402   01/25/21 1830  cefTRIAXone (ROCEPHIN) 1 g in sodium chloride 0.9 % 100 mL IVPB  Status:  Discontinued        1 g 200 mL/hr over 30 Minutes Intravenous Every 24 hours 01/25/21 1740 01/25/21 2107   01/13/21 1400  piperacillin-tazobactam (ZOSYN) IVPB 3.375 g  Status:  Discontinued        3.375 g 12.5 mL/hr over 240 Minutes Intravenous Every 8 hours 01/13/21 1030 01/15/21 0813   01/13/21 1200  piperacillin-tazobactam (ZOSYN) IVPB 3.375 g  Status:  Discontinued        3.375 g 100 mL/hr over 30 Minutes Intravenous Every 6 hours 01/13/21 0955 01/13/21 1030   01/13/21 1045  ceFEPIme (MAXIPIME) 2 g in sodium chloride 0.9 % 100 mL IVPB  Status:  Discontinued        2 g 200 mL/hr over 30 Minutes Intravenous Every 12 hours 01/13/21 0949 01/13/21 0954   01/08/21 1100  cefTRIAXone (ROCEPHIN) 2 g in sodium chloride 0.9 % 100 mL IVPB  Status:  Discontinued        2 g 200 mL/hr over 30 Minutes Intravenous Every 24 hours 01/07/21 1244 01/10/21 1012   01/08/21 0600  vancomycin (VANCOREADY) IVPB 1250 mg/250 mL  Status:  Discontinued        1,250 mg 166.7 mL/hr over 90 Minutes Intravenous Every 48 hours 01/06/21 2202 01/08/21 1010   01/07/21 1108  ceFEPIme (MAXIPIME) 2 g in sodium chloride 0.9 % 100 mL IVPB  Status:  Discontinued        2 g 200 mL/hr over 30 Minutes Intravenous Every 24 hours 01/06/21 2202 01/07/21 1244   01/06/21 1200  vancomycin (VANCOREADY) IVPB 1000 mg/200 mL  Status:  Discontinued        1,000 mg 200 mL/hr over 60 Minutes Intravenous Every 24 hours  01/05/21 2209 01/06/21 2202   01/06/21 1200  ceFEPIme (MAXIPIME) 2 g in sodium chloride 0.9 % 100 mL IVPB  Status:  Discontinued        2 g 200 mL/hr over 30 Minutes Intravenous Every 12 hours 01/05/21 2209 01/06/21 2202   01/04/21 1400  ceFEPIme (MAXIPIME) 2 g in sodium chloride 0.9 % 100 mL IVPB        2 g 200 mL/hr over 30 Minutes Intravenous Every 8 hours 01/04/21 1335 01/05/21 2231   01/04/21 1000  ceFEPIme (MAXIPIME) 2 g in sodium chloride 0.9 % 100 mL IVPB  Status:  Discontinued        2 g 200 mL/hr over 30 Minutes Intravenous Every 12 hours 01/03/21 2131 01/04/21 1335   01/04/21 0600  vancomycin (VANCOREADY) IVPB 750 mg/150 mL  Status:  Discontinued        750 mg 150 mL/hr over 60 Minutes Intravenous Every 12 hours 01/03/21 1436 01/05/21 2209   01/03/21 2200  vancomycin (VANCOREADY) IVPB 750 mg/150 mL  Status:  Discontinued        750 mg 150 mL/hr over 60 Minutes Intravenous Every 12 hours 01/03/21 1241 01/03/21 1436   01/03/21 1400  ceFEPIme (MAXIPIME) 2 g in sodium chloride 0.9 % 100 mL IVPB  Status:  Discontinued        2 g 200 mL/hr over 30 Minutes Intravenous Every 8 hours 01/03/21 1238 01/03/21 2131   01/03/21 1330  vancomycin (VANCOREADY) IVPB 1250 mg/250 mL        1,250 mg 166.7 mL/hr over 90 Minutes Intravenous  Once 01/03/21 1241 01/03/21 1640       Radiology Reports  No results found.    DVT prophylaxis: Lovenox  Code Status: Full code  Family Communication: No family at bedside   Consultants:  PCCM  Cardiology  Nephrology  Procedures:  Intubation  Cardiac catheterization  CRRT    Objective    Physical Examination:    General-appears in no acute distress  Heart-S1-S2, regular, no murmur auscultated  Lungs-clear to auscultation bilaterally, no wheezing or crackles auscultated  Abdomen-soft, nontender, no organomegaly  Extremities-no edema in the lower extremities  Neuro-alert, oriented x3, no focal deficit noted   Status  is: Inpatient  Dispo: The patient is from: Home              Anticipated d/c is to: Skilled nursing facility versus inpatient rehab              Anticipated d/c date is: 02/06/2021              Patient currently  stable for discharge  Barrier to discharge-awaiting bed at skilled nursing facility  COVID-19 Labs  No results for input(s): DDIMER, FERRITIN, LDH, CRP in the last 72 hours.  Lab Results  Component Value Date   [place] NEGATIVE 01/03/2021    Microbiology  No results found for this or any previous visit (from the past 240 hour(s)).  Pressure Injury 01/20/21 Heel Left Deep Tissue Pressure Injury - Purple or maroon localized area of discolored intact skin or blood-filled blister due to damage of underlying soft tissue from pressure and/or shear. (Active)  01/20/21 0100  Location: Heel  Location Orientation: Left  Staging: Deep Tissue Pressure Injury - Purple or maroon localized area of discolored intact skin or blood-filled blister due to damage of underlying soft tissue from pressure and/or shear.  Wound Description (Comments):   Present on Admission: No       [facility] If 7PM-7AM, please contact night-coverage at [email or web address], Office  [phone]   02/05/2021, 2:56 PM  LOS: 33 days

## 2021-02-06 ENCOUNTER — Telehealth: Payer: Self-pay | Admitting: Family Medicine

## 2021-02-06 DIAGNOSIS — R570 Cardiogenic shock: Secondary | ICD-10-CM | POA: Diagnosis not present

## 2021-02-06 LAB — MAGNESIUM: Magnesium: 1.6 mg/dL — ABNORMAL LOW (ref 1.7–2.4)

## 2021-02-06 LAB — BASIC METABOLIC PANEL
Anion gap: 7 (ref 5–15)
BUN: 25 mg/dL — ABNORMAL HIGH (ref 8–23)
CO2: 20 mmol/L — ABNORMAL LOW (ref 22–32)
Calcium: 8.5 mg/dL — ABNORMAL LOW (ref 8.9–10.3)
Chloride: 109 mmol/L (ref 98–111)
Creatinine, Ser: 1.71 mg/dL — ABNORMAL HIGH (ref 0.61–1.24)
GFR, Estimated: 43 mL/min — ABNORMAL LOW (ref >60)
Glucose, Bld: 93 mg/dL (ref 70–99)
Potassium: 3.7 mmol/L (ref 3.5–5.1)
Sodium: 136 mmol/L (ref 135–145)

## 2021-02-06 LAB — CBC
HCT: 25.5 % — ABNORMAL LOW (ref 39.0–52.0)
Hemoglobin: 8 g/dL — ABNORMAL LOW (ref 13.0–17.0)
MCH: 30.4 pg (ref 26.0–34.0)
MCHC: 31.4 g/dL (ref 30.0–36.0)
MCV: 97 fL (ref 80.0–100.0)
Platelets: 149 10*3/uL — ABNORMAL LOW (ref 150–400)
RBC: 2.63 MIL/uL — ABNORMAL LOW (ref 4.22–5.81)
RDW: 18 % — ABNORMAL HIGH (ref 11.5–15.5)
WBC: 7.8 10*3/uL (ref 4.0–10.5)
nRBC: 0 % (ref 0.0–0.2)

## 2021-02-06 LAB — GLUCOSE, CAPILLARY
Glucose-Capillary: 100 mg/dL — ABNORMAL HIGH (ref 70–99)
Glucose-Capillary: 94 mg/dL (ref 70–99)
Glucose-Capillary: 132 mg/dL — ABNORMAL HIGH (ref 70–99)
Glucose-Capillary: 96 mg/dL (ref 70–99)

## 2021-02-06 LAB — SARS CORONAVIRUS 2 (TAT 6-24 HRS): SARS Coronavirus 2: NEGATIVE

## 2021-02-06 MED ORDER — CARVEDILOL 3.125 MG PO TABS
3.1250 mg | ORAL_TABLET | Freq: Two times a day (BID) | ORAL | Status: DC
  Administered 2021-02-06 – 2021-02-07 (×2): 3.125 mg via ORAL
  Filled 2021-02-06 (×2): qty 1

## 2021-02-06 MED ORDER — ISOSORBIDE MONONITRATE ER 30 MG PO TB24
30.0000 mg | ORAL_TABLET | Freq: Every day | ORAL | Status: DC
  Administered 2021-02-06 – 2021-02-07 (×2): 30 mg via ORAL
  Filled 2021-02-06 (×2): qty 1

## 2021-02-06 NOTE — TOC Transition Note (Signed)
Transition of Care [facility]) - CM/SW Discharge Note   Patient Details  Name: [name] MRN: [id number] Date of Birth: [birth year]  Transition of Care [facility]) CM/SW Contact:  Zenon Mayo, RN Phone Number: 02/06/2021, 5:05 PM   Clinical Narrative:    NCM spoke with patient at bedside, offered choice for [facility]. He states Alvis Lemmings will be ok with him.  NCM made referral to [facility] with [facility] for [place], he states he can take referral.  Soc will begin 24 to 48 hrs post dc.  Patient states he needs rolling walker, NCM made referral to [facility] with [facility] for rolling walker, this will be brought up to room prior to dc.  He states his friend will transport him home tomorrow or he will use [facility] transport, he is not sure.     Final next level of care: [place] Barriers to Discharge: Continued Medical Work up   Patient Goals and CMS Choice Patient states their goals for this hospitalization and ongoing recovery are:: return home CMS Medicare.gov Compare Post Acute Care list provided to:: Patient Choice offered to / list presented to : Patient  Discharge Placement                       Discharge Plan and Services In-house Referral: Clinical Social Work Discharge Planning Services: CM Consult Post Acute Care Choice: [place]          DME Arranged: Gilford Rile rolling DME Agency: [employer] Date DME Agency Contacted: 02/06/21 Time DME Agency Contacted: [phone] Representative spoke with at DME Agency: [place]: PT,OT [place]: [place] Date [place]: 02/06/21 Time [place]: 1705 Representative spoke with at [place]: [place] ([place]) Interventions     Readmission Risk Interventions No flowsheet data found.

## 2021-02-06 NOTE — Progress Notes (Addendum)
Patient ID: [name], male   DOB: 11-12-1950, 70 y.o.   MRN: [id number]     Advanced Heart Failure Rounding Note  PCP-Cardiologist: None   Subjective:    Continues to improve with rehab. No CP or SOB. Going to SNF today   Objective:   Weight Range: 79.9 kg Body mass index is 23.23 kg/m.   Vital Signs:   Temp:  [98.1 F (36.7 C)-98.8 F (37.1 C)] 98.5 F (36.9 C) (05/18 0740) Pulse Rate:  [70-92] 85 (05/18 0740) Resp:  [16-28] 21 (05/18 0740) BP: (103-133)/(61-76) 123/65 (05/18 0740) SpO2:  [94 %-100 %] 98 % (05/18 0740) Weight:  [79.9 kg] 79.9 kg (05/18 0122) Last BM Date: 02/05/21  Weight change: Filed Weights   02/04/21 0324 02/05/21 0300 02/06/21 0122  Weight: 79.7 kg 80.3 kg 79.9 kg    Intake/Output:   Intake/Output Summary (Last 24 hours) at 02/06/2021 0912 Last data filed at 02/06/2021 0811 Gross per 24 hour  Intake 1300 ml  Output 1995 ml  Net -695 ml      Physical Exam   General:  Well appearing. No resp difficulty HEENT: normal Neck: supple. no JVD. Carotids 2+ bilat; no bruits. No lymphadenopathy or thryomegaly appreciated. Cor: PMI nondisplaced. Regular rate & rhythm. No rubs, gallops or murmurs. Lungs: clear Abdomen: soft, nontender, nondistended. No hepatosplenomegaly. No bruits or masses. Good bowel sounds. Extremities: no cyanosis, clubbing, rash, edema Neuro: alert & orientedx3, cranial nerves grossly intact. moves all 4 extremities w/o difficulty. Affect pleasant  Telemetry   SR 80s Personally reviewed  Labs    CBC Recent Labs    02/06/21 0453  WBC 7.8  HGB 8.0*  HCT 25.5*  MCV 97.0  PLT 099*   Basic Metabolic Panel Recent Labs    02/05/21 0345 02/06/21 0453  NA 136 136  K 3.6 3.7  CL 109 109  CO2 19* 20*  GLUCOSE 95 93  BUN 25* 25*  CREATININE 1.82* 1.71*  CALCIUM 8.2* 8.5*  MG 1.7 1.6*   Liver Function Tests No results for input(s): AST, ALT, ALKPHOS, BILITOT, PROT, ALBUMIN in the last 72 hours. No  results for input(s): LIPASE, AMYLASE in the last 72 hours. Cardiac Enzymes No results for input(s): CKTOTAL, CKMB, CKMBINDEX, TROPONINI in the last 72 hours.  BNP: BNP (last 3 results) No results for input(s): BNP in the last 8760 hours.  ProBNP (last 3 results) No results for input(s): PROBNP in the last 8760 hours.   D-Dimer No results for input(s): DDIMER in the last 72 hours. Hemoglobin A1C No results for input(s): HGBA1C in the last 72 hours. Fasting Lipid Panel No results for input(s): CHOL, HDL, LDLCALC, TRIG, CHOLHDL, LDLDIRECT in the last 72 hours. Thyroid Function Tests No results for input(s): TSH, T4TOTAL, T3FREE, THYROIDAB in the last 72 hours.  Invalid input(s): FREET3  Other results:   Imaging    No results found.   Medications:     Scheduled Medications: . aspirin  81 mg Oral Daily  . enoxaparin (LOVENOX) injection  40 mg Subcutaneous Q24H  . feeding supplement  1 Container Oral TID BM  . hydrALAZINE  25 mg Oral Q8H  . insulin aspart  0-9 Units Subcutaneous TID AC & HS  . isosorbide dinitrate  10 mg Oral TID  . losartan  25 mg Oral Daily  . modafinil  100 mg Oral Daily  . pantoprazole sodium  40 mg Oral Daily  . rosuvastatin  10 mg Oral Daily  . sodium bicarbonate  650 mg Oral BID  . spironolactone  12.5 mg Oral Daily  . ticagrelor  90 mg Per Tube BID    Infusions:   PRN Medications: acetaminophen, ALPRAZolam, atropine, ipratropium-albuterol, ondansetron (ZOFRAN) IV, Zinc Oxide    Assessment/Plan   1. CAD/ Acute Inferior STEMI - emergent cath w/ pRCA occlusion s/p PCI + DES - Continue DAPT/statin - no s/s angina  2. VF Arrest - in setting of acute MI, now s/p revascularization  - No further VF. Rhythm stable   3. Acute Systolic Heart Failure>>Cardiogenic Shock  - ICM Echo LVEF 20-25%, RV moderately reduced - Impella out 4/20. - CVVH stopped 4/24.  - Much improved - HF meds for d/c:  Asa 81 mg daily Brilinta 90 bid Crestor  10  Losartan 25 daily Spiro 12.5 daily  Hydralazine 25 q8 Imdur 30 mg daily carvedilol 3.125 bid Lasix 40 mg daily with Kcl 20 for weight greater than 180 only (PRN only)   4. Acute hypoxic respiratory failure - 4/24 failed extubation trial.  - 5/1 reintubated due to inability to handle secretions/hemoptysis - 5/4 extubated   5. Nonoliguric AKI - due to ATN/shock - CVVH begun 4/22 and stopped 4/24. Had one session of iHD on 4/30 for clearance - Renal function now improving   6. Anoxic brain injury  - EEG w/ generalized periodic discharges with triphasic morphology + moderate to severe diffuse encephalopathy.  - MRI- numerous micro embolic infarctions . No hemorrhage or mass.  - CIR has turned down due to poor rehab potential. Recommending SNF - Dramatic improvement and re-evaluated by CIR however turned down again due to his need for long term care which would be difficult to achieve after CIR.   - For SNF today   - HF meds for d/c:  Asa 81 mg daily Brilinta 90 bid Crestor 10  Losartan 25 daily Spiro 12.5 daily  Hydralazine 25 q8 Imdur 30 mg daily carvedilol 3.125 bid Lasix 40 mg daily with Kcl 20 for weight greater than 180 only (PRN only)  Will arrange f/u in HF Clinic.     Length of Stay: [place], [place]  9:12 AM

## 2021-02-06 NOTE — TOC Progression Note (Addendum)
Transition of Care [facility]) - Progression Note    Patient Details  Name: [name] MRN: [id number] Date of Birth: [birth year]  [facility]) CM/SW Contact  Zenon Mayo, RN Phone Number: 02/06/2021, 4:33 PM  Clinical Narrative:    NCM called [name] POA , left message for return call.  He will need HH setup and DME.  [phone].   Expected Discharge Plan: [place] Barriers to Discharge: Continued Medical Work up  Expected Discharge Plan and Services Expected Discharge Plan: [place] In-house Referral: Clinical Social Work Discharge Planning Services: CM Consult Post Acute Care Choice: [place] arrangements for the past 2 months: Single Family Home                                       Social Determinants of Health (SDOH) Interventions    Readmission Risk Interventions No flowsheet data found.

## 2021-02-06 NOTE — TOC Progression Note (Addendum)
Transition of Care (TOC) - Progression Note  Heart Failure   Patient Details  Name: [name] MRN: [id number] Date of Birth: [birth year]  Transition of Care [facility]) CM/SW [place], [place] Phone Number: 02/06/2021, 8:50 AM  Clinical Narrative:    CSW received a call this morning from the patients HCPOA [name] [phone] who reported that they have picked [facility] for rehab. CSW uploaded clinicals in the [employer] portal and reference number [id number] for pending insurance authorization. CSW sent a secure chat the to the attending MD regarding a COVID test for patients discharge.  11:40am - CSW received a call from an [place] at [place] [phone] who reported they are requesting peer to peer for [name] and for the doctor to call [phone] select option 5 and the deadline is today 02/06/21 at [place] sent a secure chat to the attending MD to notify them of this information.    1:11pm - CSW received a call from [facility] who denied peer to peer due to no change in function for the patient and the patient has the right to deny the appeal and call [phone]. CSW called the patients HCPOA [name] [phone] and informed him about this and spoke with the patient at the bedside regarding the insurance denial. The patient and HCPOA do not want to appeal the health insurance and decided that home health would be the next best option at this point. CSW made the NCM aware that home health and DME needs to be arranged for the patient.  CSW scheduled the patient for a hospital follow up with his primary care doctor [name] Penna for Thursday 02/14/2021 at 10:15am. CSW spoke with the patient at bedside and provided the patient with information for the PCP appointment and arranged for [facility] for all follow up appointments. CSW also gave the patient an appointment card for the [facility] outpatient clinic and encouraged them to follow up and to attend the  appointment and bring their medications and if anything changes to please reach out so that CSW/HV clinic team can provide support.  TOC will continue to follow through discharge.      Expected Discharge Plan: [place] Barriers to Discharge: Continued Medical Work up  Expected Discharge Plan and Services Expected Discharge Plan: [place] In-house Referral: Clinical Social Work Discharge Planning Services: CM Consult Post Acute Care Choice: [place] arrangements for the past 2 months: Single Family Home                                       Social Determinants of Health (SDOH) Interventions    Readmission Risk Interventions No flowsheet data found.  Doy Taaffe, MSW, [place] Heart Failure Social Worker

## 2021-02-06 NOTE — Progress Notes (Signed)
PROGRESS NOTE    [name]  [id number] DOB: Jan 19, 1951 DOA: 01/03/2021 PCP: [name], MD     Brief Narrative:  [name] is a 70 year old male with past medical history of hypertension, hyperlipidemia, CAD who was admitted with ST relation MI and cardiac arrest prior to coming to hospital.  He underwent emergent cardiac catheterization and PCI with stent to proximal RCA.  Ejection fraction was noted to be 25 to 30%.  Patient did have prolonged ICU stay for cardiogenic/hemorrhagic shock requiring vasopressors and transfusion.  He suffered from diminished mental status and MRI showed scattered punctate infarction concerning for anoxic injury.  Also had acute kidney injury and required CRRT and was subsequently transitioned to intermittent hemodialysis.  Patient was extubated on 01/15/2021 and reintubated on 01/20/2021.  Subsequently was extubated on 01/23/2021 and was transferred out of ICU.  Patient's mental status has significantly improved.  Palliative care was consulted and healthcare power of attorney decided not to escalate care and made him DNR.  Nephrology and cardiology have been following the patient.  Significant Events: 4/20 Impella(heart pump) removed. MRI brain with numerous micro embolic infarctions. Off pressors.  4/21 lasix drip stopped.  4/22 started CVVH 4/24 CVVH stopped. Failed extubation. Re-intubated.  4/26 Extubated  5/1 Re-intubated 5/4 Extubated, off pressors   New events last 24 hours / Subjective: Patient doing well this morning, he states that he has been able to ambulate with a walker, looking forward to being discharged to SNF.  Has been able to take p.o. meals adequately.  Notified by [facility] that insurance authorization denied SNF placement.  Peer-to-peer completed which was subsequently denied.  Reason being that patient was able to ambulate 200 feet without assistive device per most recent PT notes.  Assessment & Plan:   Active Problems:    Cardiogenic shock (HCC)   Hemoptysis   Acute hypoxemic respiratory failure (HCC)   Acute renal failure (HCC)   Encephalopathy acute   Acute inferior STEMI  -Underwent emergent heart cath with PCI and drug-eluting stent -Continue aspirin, Brilinta, Crestor, hydralazine, Imdur, coreg  -Follow-up in heart failure clinic  Acute systolic heart failure with cardiogenic shock -Patient underwent CRRT during hospitalization.  Hemodialysis has since been discontinued -Continue Lasix, spironolactone, losartan  Status post V. fib arrest, anoxic brain injury -MRI brain: microembolic infarcts and EEG showed triphasic morphology with diffuse encephalopathy -Cognition has been slowly improving -Continue provigil   Pseudomonas UTI -Completed treatment  AKI -Likely ATN and cardiogenic shock -No further hemodialysis plan per nephrology -Continues to improve  Nutrition -Cortrak removed.  Patient has been eating well and p.o. intake significantly improved.     In agreement with assessment of the pressure ulcer as below:  Pressure Injury 01/20/21 Heel Left Deep Tissue Pressure Injury - Purple or maroon localized area of discolored intact skin or blood-filled blister due to damage of underlying soft tissue from pressure and/or shear. (Active)  01/20/21 0100  Location: Heel  Location Orientation: Left  Staging: Deep Tissue Pressure Injury - Purple or maroon localized area of discolored intact skin or blood-filled blister due to damage of underlying soft tissue from pressure and/or shear.  Wound Description (Comments):   Present on Admission: No     Nutrition Problem: Inadequate oral intake Etiology: acute illness   DVT prophylaxis:  enoxaparin (LOVENOX) injection 40 mg Start: 01/29/21 0915 Place and maintain sequential compression device Start: 01/20/21 0023 Place TED hose Start: 01/05/21 0900  Code Status:     Code Status Orders  (  From admission, onward)         Start      Ordered   01/23/21 1746  Do not attempt resuscitation (DNR)  Continuous       Question Answer Comment  In the event of cardiac or respiratory ARREST Do not call a "code blue"   In the event of cardiac or respiratory ARREST Do not perform Intubation, CPR, defibrillation or ACLS   In the event of cardiac or respiratory ARREST Use medication by any route, position, wound care, and other measures to relive pain and suffering. May use oxygen, suction and manual treatment of airway obstruction as needed for comfort.      01/23/21 1745        Code Status History    Date Active Date Inactive Code Status Order ID Comments User Context   01/03/2021 1231 01/23/2021 1745 Full Code [id number]  Jolaine Artist, MD Inpatient   01/03/2021 1231 01/03/2021 1231 Full Code [id number]  Bensimhon, Shaune Pascal, MD Inpatient   Advance Care Planning Activity    Advance Directive Documentation   Flowsheet Row Most Recent Value  Type of Advance Directive Living will  Pre-existing out of facility DNR order (yellow form or pink MOST form) --  "MOST" Form in Place? --     Family Communication: None at bedside Disposition Plan:  Status is: Inpatient  Remains inpatient appropriate because:Unsafe d/c plan   Dispo: The patient is from: Home              Anticipated d/c is to: Home              Patient currently is medically stable to d/c. Denied by insurance for SNF placement. Looking into setting up home health   Difficult to place patient No     Antimicrobials:  Anti-infectives (From admission, onward)   Start     Dose/Rate Route Frequency Ordered Stop   01/27/21 2200  cefTAZidime (FORTAZ) 2 g in sodium chloride 0.9 % 100 mL IVPB        2 g 200 mL/hr over 30 Minutes Intravenous Every 12 hours 01/27/21 1403 02/03/21 2247   01/27/21 1500  cefTAZidime (FORTAZ) 2 g in sodium chloride 0.9 % 100 mL IVPB  Status:  Discontinued        2 g 200 mL/hr over 30 Minutes Intravenous Every 12 hours 01/27/21 1402 01/27/21  1403   01/25/21 2200  cefTRIAXone (ROCEPHIN) 1 g in sodium chloride 0.9 % 100 mL IVPB  Status:  Discontinued       Note to Pharmacy: Dose was documented as given earlier this evening but IV became disconnected at some point and RN thinks he may not have received the dose   1 g 200 mL/hr over 30 Minutes Intravenous Every 24 hours 01/25/21 2107 01/27/21 1402   01/25/21 1830  cefTRIAXone (ROCEPHIN) 1 g in sodium chloride 0.9 % 100 mL IVPB  Status:  Discontinued        1 g 200 mL/hr over 30 Minutes Intravenous Every 24 hours 01/25/21 1740 01/25/21 2107   01/13/21 1400  piperacillin-tazobactam (ZOSYN) IVPB 3.375 g  Status:  Discontinued        3.375 g 12.5 mL/hr over 240 Minutes Intravenous Every 8 hours 01/13/21 1030 01/15/21 0813   01/13/21 1200  piperacillin-tazobactam (ZOSYN) IVPB 3.375 g  Status:  Discontinued        3.375 g 100 mL/hr over 30 Minutes Intravenous Every 6 hours 01/13/21 0955 01/13/21 1030  01/13/21 1045  ceFEPIme (MAXIPIME) 2 g in sodium chloride 0.9 % 100 mL IVPB  Status:  Discontinued        2 g 200 mL/hr over 30 Minutes Intravenous Every 12 hours 01/13/21 0949 01/13/21 0954   01/08/21 1100  cefTRIAXone (ROCEPHIN) 2 g in sodium chloride 0.9 % 100 mL IVPB  Status:  Discontinued        2 g 200 mL/hr over 30 Minutes Intravenous Every 24 hours 01/07/21 1244 01/10/21 1012   01/08/21 0600  vancomycin (VANCOREADY) IVPB 1250 mg/250 mL  Status:  Discontinued        1,250 mg 166.7 mL/hr over 90 Minutes Intravenous Every 48 hours 01/06/21 2202 01/08/21 1010   01/07/21 1108  ceFEPIme (MAXIPIME) 2 g in sodium chloride 0.9 % 100 mL IVPB  Status:  Discontinued        2 g 200 mL/hr over 30 Minutes Intravenous Every 24 hours 01/06/21 2202 01/07/21 1244   01/06/21 1200  vancomycin (VANCOREADY) IVPB 1000 mg/200 mL  Status:  Discontinued        1,000 mg 200 mL/hr over 60 Minutes Intravenous Every 24 hours 01/05/21 2209 01/06/21 2202   01/06/21 1200  ceFEPIme (MAXIPIME) 2 g in sodium  chloride 0.9 % 100 mL IVPB  Status:  Discontinued        2 g 200 mL/hr over 30 Minutes Intravenous Every 12 hours 01/05/21 2209 01/06/21 2202   01/04/21 1400  ceFEPIme (MAXIPIME) 2 g in sodium chloride 0.9 % 100 mL IVPB        2 g 200 mL/hr over 30 Minutes Intravenous Every 8 hours 01/04/21 1335 01/05/21 2231   01/04/21 1000  ceFEPIme (MAXIPIME) 2 g in sodium chloride 0.9 % 100 mL IVPB  Status:  Discontinued        2 g 200 mL/hr over 30 Minutes Intravenous Every 12 hours 01/03/21 2131 01/04/21 1335   01/04/21 0600  vancomycin (VANCOREADY) IVPB 750 mg/150 mL  Status:  Discontinued        750 mg 150 mL/hr over 60 Minutes Intravenous Every 12 hours 01/03/21 1436 01/05/21 2209   01/03/21 2200  vancomycin (VANCOREADY) IVPB 750 mg/150 mL  Status:  Discontinued        750 mg 150 mL/hr over 60 Minutes Intravenous Every 12 hours 01/03/21 1241 01/03/21 1436   01/03/21 1400  ceFEPIme (MAXIPIME) 2 g in sodium chloride 0.9 % 100 mL IVPB  Status:  Discontinued        2 g 200 mL/hr over 30 Minutes Intravenous Every 8 hours 01/03/21 1238 01/03/21 2131   01/03/21 1330  vancomycin (VANCOREADY) IVPB 1250 mg/250 mL        1,250 mg 166.7 mL/hr over 90 Minutes Intravenous  Once 01/03/21 1241 01/03/21 1640        Objective: Vitals:   02/06/21 0122 02/06/21 0428 02/06/21 0615 02/06/21 0740  BP:  115/76 133/73 123/65  Pulse:  92  85  Resp:  19  (!) 21  Temp:  98.2 F (36.8 C)  98.5 F (36.9 C)  TempSrc:  Oral  Oral  SpO2:  99%  98%  Weight: 79.9 kg     Height:        Intake/Output Summary (Last 24 hours) at 02/06/2021 1424 Last data filed at 02/06/2021 1341 Gross per 24 hour  Intake 1060 ml  Output 2295 ml  Net -1235 ml   Filed Weights   02/04/21 0324 02/05/21 0300 02/06/21 0122  Weight: 79.7 kg  80.3 kg 79.9 kg    Examination: General exam: Appears calm and comfortable  Respiratory system: Clear to auscultation. Respiratory effort normal. No respiratory distress. No conversational  dyspnea.  Remains on room air Cardiovascular system: S1 & S2 heard, RRR. No murmurs. No pedal edema. Gastrointestinal system: Abdomen is nondistended, soft and nontender. Normal bowel sounds heard. Central nervous system: Alert and oriented. No focal neurological deficits. Speech clear.  Extremities: Symmetric in appearance  Skin: No rashes, lesions or ulcers on exposed skin  Psychiatry: Judgement and insight appear normal. Mood & affect appropriate.   Data Reviewed: I have personally reviewed following labs and imaging studies  CBC: Recent Labs  Lab 01/31/21 0316 02/01/21 0935 02/03/21 0307 02/06/21 0453  WBC 14.6* 10.6* 12.6* 7.8  HGB 7.4* 8.1* 8.2* 8.0*  HCT 23.0* 24.8* 25.8* 25.5*  MCV 96.2 98.0 95.9 97.0  PLT 212 196 185 269*   Basic Metabolic Panel: Recent Labs  Lab 02/02/21 0259 02/03/21 0307 02/04/21 0639 02/05/21 0345 02/06/21 0453  NA 136 136 137 136 136  K 3.9 3.5 3.5 3.6 3.7  CL 109 110 108 109 109  CO2 17* 17* 20* 19* 20*  GLUCOSE 103* 89 111* 95 93  BUN 41* 32* 26* 25* 25*  CREATININE 1.90* 1.70* 1.76* 1.82* 1.71*  CALCIUM 8.5* 8.5* 8.5* 8.2* 8.5*  MG 1.7 1.6* 2.0 1.7 1.6*   GFR: Estimated Creatinine Clearance: 45.4 mL/min (A) (by C-G formula based on SCr of 1.71 mg/dL (H)). Liver Function Tests: Recent Labs  Lab 01/31/21 0316 02/03/21 0307  AST 26 18  ALT 83* 45*  ALKPHOS 81 75  BILITOT 0.4 0.9  PROT 5.7* 6.0*  ALBUMIN 2.2* 2.6*   No results for input(s): LIPASE, AMYLASE in the last 168 hours. No results for input(s): AMMONIA in the last 168 hours. Coagulation Profile: No results for input(s): INR, PROTIME in the last 168 hours. Cardiac Enzymes: No results for input(s): CKTOTAL, CKMB, CKMBINDEX, TROPONINI in the last 168 hours. BNP (last 3 results) No results for input(s): PROBNP in the last 8760 hours. HbA1C: No results for input(s): HGBA1C in the last 72 hours. CBG: Recent Labs  Lab 02/05/21 1113 02/05/21 1631 02/05/21 2110  02/06/21 0607 02/06/21 1121  GLUCAP 109* 125* 122* 100* 94   Lipid Profile: No results for input(s): CHOL, HDL, LDLCALC, TRIG, CHOLHDL, LDLDIRECT in the last 72 hours. Thyroid Function Tests: No results for input(s): TSH, T4TOTAL, FREET4, T3FREE, THYROIDAB in the last 72 hours. Anemia Panel: No results for input(s): VITAMINB12, FOLATE, FERRITIN, TIBC, IRON, RETICCTPCT in the last 72 hours. Sepsis Labs: No results for input(s): PROCALCITON, LATICACIDVEN in the last 168 hours.  No results found for this or any previous visit (from the past 240 hour(s)).    Radiology Studies: No results found.    Scheduled Meds: . aspirin  81 mg Oral Daily  . carvedilol  3.125 mg Oral BID WC  . enoxaparin (LOVENOX) injection  40 mg Subcutaneous Q24H  . feeding supplement  1 Container Oral TID BM  . hydrALAZINE  25 mg Oral Q8H  . insulin aspart  0-9 Units Subcutaneous TID AC & HS  . isosorbide mononitrate  30 mg Oral Daily  . losartan  25 mg Oral Daily  . modafinil  100 mg Oral Daily  . pantoprazole sodium  40 mg Oral Daily  . rosuvastatin  10 mg Oral Daily  . sodium bicarbonate  650 mg Oral BID  . spironolactone  12.5 mg Oral Daily  .  ticagrelor  90 mg Per Tube BID   Continuous Infusions:   LOS: 34 days      Time spent: 45 minutes   Dessa Phi, DO [facility] 02/06/2021, 2:24 PM   Available via Epic secure chat 7am-7pm After these hours, please refer to coverage provider listed on [email or web address]

## 2021-02-07 ENCOUNTER — Other Ambulatory Visit (HOSPITAL_COMMUNITY): Payer: Self-pay

## 2021-02-07 DIAGNOSIS — R570 Cardiogenic shock: Secondary | ICD-10-CM | POA: Diagnosis not present

## 2021-02-07 LAB — BASIC METABOLIC PANEL
Anion gap: 9 (ref 5–15)
BUN: 26 mg/dL — ABNORMAL HIGH (ref 8–23)
CO2: 20 mmol/L — ABNORMAL LOW (ref 22–32)
Calcium: 8.5 mg/dL — ABNORMAL LOW (ref 8.9–10.3)
Chloride: 108 mmol/L (ref 98–111)
Creatinine, Ser: 1.75 mg/dL — ABNORMAL HIGH (ref 0.61–1.24)
GFR, Estimated: 41 mL/min — ABNORMAL LOW (ref >60)
Glucose, Bld: 91 mg/dL (ref 70–99)
Potassium: 3.4 mmol/L — ABNORMAL LOW (ref 3.5–5.1)
Sodium: 137 mmol/L (ref 135–145)

## 2021-02-07 LAB — GLUCOSE, CAPILLARY
Glucose-Capillary: 86 mg/dL (ref 70–99)
Glucose-Capillary: 92 mg/dL (ref 70–99)

## 2021-02-07 MED ORDER — HYDRALAZINE HCL 25 MG PO TABS
25.0000 mg | ORAL_TABLET | Freq: Three times a day (TID) | ORAL | 0 refills | Status: DC
  Filled 2021-02-07: qty 90, 30d supply, fill #0

## 2021-02-07 MED ORDER — LOSARTAN POTASSIUM 25 MG PO TABS
25.0000 mg | ORAL_TABLET | Freq: Every day | ORAL | 0 refills | Status: DC
  Filled 2021-02-07: qty 30, 30d supply, fill #0

## 2021-02-07 MED ORDER — SPIRONOLACTONE 25 MG PO TABS
12.5000 mg | ORAL_TABLET | Freq: Every day | ORAL | 0 refills | Status: DC
  Filled 2021-02-07: qty 15, 30d supply, fill #0

## 2021-02-07 MED ORDER — CARVEDILOL 3.125 MG PO TABS
3.1250 mg | ORAL_TABLET | Freq: Two times a day (BID) | ORAL | 0 refills | Status: DC
  Filled 2021-02-07: qty 60, 30d supply, fill #0

## 2021-02-07 MED ORDER — FUROSEMIDE 40 MG PO TABS
40.0000 mg | ORAL_TABLET | Freq: Every day | ORAL | 0 refills | Status: DC | PRN
  Filled 2021-02-07: qty 30, 30d supply, fill #0

## 2021-02-07 MED ORDER — ISOSORBIDE MONONITRATE ER 30 MG PO TB24
30.0000 mg | ORAL_TABLET | Freq: Every day | ORAL | 0 refills | Status: DC
  Filled 2021-02-07: qty 30, 30d supply, fill #0

## 2021-02-07 MED ORDER — MODAFINIL 100 MG PO TABS
100.0000 mg | ORAL_TABLET | Freq: Every day | ORAL | 0 refills | Status: DC
  Filled 2021-02-07: qty 30, 30d supply, fill #0

## 2021-02-07 MED ORDER — ROSUVASTATIN CALCIUM 10 MG PO TABS
10.0000 mg | ORAL_TABLET | Freq: Every day | ORAL | 0 refills | Status: DC
  Filled 2021-02-07: qty 30, 30d supply, fill #0

## 2021-02-07 MED ORDER — POTASSIUM CHLORIDE CRYS ER 20 MEQ PO TBCR
20.0000 meq | EXTENDED_RELEASE_TABLET | Freq: Every day | ORAL | 0 refills | Status: DC | PRN
  Filled 2021-02-07: qty 30, 30d supply, fill #0

## 2021-02-07 MED ORDER — TICAGRELOR 90 MG PO TABS
90.0000 mg | ORAL_TABLET | Freq: Two times a day (BID) | ORAL | 0 refills | Status: DC
  Filled 2021-02-07: qty 60, 30d supply, fill #0

## 2021-02-07 MED ORDER — ASPIRIN 81 MG PO CHEW
81.0000 mg | CHEWABLE_TABLET | Freq: Every day | ORAL | 0 refills | Status: DC
  Filled 2021-02-07: qty 30, 30d supply, fill #0

## 2021-02-07 MED ORDER — POTASSIUM CHLORIDE CRYS ER 20 MEQ PO TBCR
40.0000 meq | EXTENDED_RELEASE_TABLET | Freq: Once | ORAL | Status: AC
  Administered 2021-02-07: 40 meq via ORAL
  Filled 2021-02-07: qty 2

## 2021-02-07 NOTE — Progress Notes (Signed)
Nutrition Follow-up  DOCUMENTATION CODES:   Not applicable  INTERVENTION:   -Continue liberalized diet of regular -Continue Magic cup BID with meals, each supplement provides 290 kcal and 9 grams of protein -Continue Boost Breeze po TID, each supplement provides 250 kcal and 9 grams of protein  NUTRITION DIAGNOSIS:   Inadequate oral intake related to acute illness as evidenced by NPO status.  Progressing; advanced to PO diet on 01/28/21  GOAL:   Patient will meet greater than or equal to 90% of their needs  Progressing   MONITOR:   TF tolerance  REASON FOR ASSESSMENT:   Ventilator    ASSESSMENT:   70 yo male admitted with STEMI with cardiogenic shock, cardiac arrest and underwent PCI with stent to RCA, Impella placement  4/20 Impella removed. MRI brain with numerous micro embolic infarctions. Off pressors.  4/21 lasix drip stopped.  4/22 started CVVH 4/24 CVVH stopped. Failed extubation. Re-intubated.  4/26 Extubated  5/1 Re-intubated 5/4 Extubated, off pressors 5/9 s/p BSE- advanced to dysphagia 1 diet with thin liquids, calorie count initiated, transitioned to nocturnal feedings 5/12- calorie count completed- pt meeting ~50% of needs PO 5/13 pt pulled out cortrak, TF d/c, diet liberalized to regular  Reviewed I/O's: -1.3 L x 24 hours and -5.2 L since 01/24/21  UOP: 1.8 L x 24 hours  Pt receiving nursing care at time of visit.   Pt with improved oral intake. Noted meal completion 100%. Pt consuming Boost Breeze supplements when available.  Per TOC notes, plan to discharge home today.   Labs reviewed: K: 3.4, CBGS: 86-132 (inpatient orders for glycemic control are 0-9 units insulin aspart TID before meals and bedtime).   Diet Order:   Diet Order            Diet - low sodium heart healthy           Diet regular Room service appropriate? Yes with Assist; Fluid consistency: Thin  Diet effective now                 EDUCATION NEEDS:   Not appropriate  for education at this time  Skin:  Skin Assessment: Reviewed RN Assessment  Last BM:  02/06/21  Height:   Ht Readings from Last 1 Encounters:  01/13/21 6\' 1"  (1.854 m)    Weight:   Wt Readings from Last 1 Encounters:  02/07/21 79.7 kg   BMI:  Body mass index is 23.18 kg/m.  Estimated Nutritional Needs:   Kcal:  2100-2300  Protein:  110-125 gm  Fluid:  >2 L/day    Loistine Chance, RD, LDN, [place] Registered Dietitian II Certified Diabetes Care and Education Specialist Please refer to [facility] for RD and/or RD on-call/weekend/after hours pager

## 2021-02-07 NOTE — Discharge Summary (Signed)
Physician Discharge Summary  Barbara Keng WNU:[id number] DOB: 1950-12-09 DOA: 01/03/2021  PCP: [name], MD  Admit date: 01/03/2021 Discharge date: 02/07/2021  Admitted From: Home Disposition:  Home with home health, insurance denied SNF placement   Recommendations for Outpatient Follow-up:  1. Follow up with PCP 5/26  2. Follow up with advance heart failure clinic 6/14   Discharge Condition: Stable, improved CODE STATUS: DNR  Diet recommendation: Heart healthy  Brief/Interim Summary: Kaisei Gilbo is a 70 year old male with past medical history of hypertension, hyperlipidemia, CAD who was admitted with ST relation MI and cardiac arrest prior to coming to hospital. He underwent emergent cardiac catheterization and PCI with stent to proximal RCA. Ejection fraction was noted to be 25 to 30%. Patient did have prolonged ICU stay for cardiogenic/hemorrhagic shock requiring vasopressors and transfusion. He suffered from diminished mental status and MRI showed scattered punctate infarction concerning for anoxic injury. Also had acute kidney injury and required CRRT and was subsequently transitioned to intermittent hemodialysis. Patient was extubated on 01/15/2021 and reintubated on 01/20/2021. Subsequently was extubated on 01/23/2021 and was transferred out of ICU. Patient's mental status has significantly improved. Palliative care was consulted and healthcare power of attorney decided not to escalate care and made him DNR. Nephrology and cardiology have been following the patient.  Significant Events: 4/20 Impella(heart pump) removed. MRI brain with numerous micro embolic infarctions. Off pressors.  4/21 lasix drip stopped.  4/22 started CVVH 4/24 CVVH stopped. Failed extubation. Re-intubated.  4/26 Extubated  5/1 Re-intubated 5/4 Extubated, off pressors   Discharge Diagnoses:  Active Problems:   Cardiogenic shock (HCC)   Hemoptysis   Acute hypoxemic respiratory failure  (HCC)   Acute renal failure (HCC)   Encephalopathy acute   Acute inferior STEMI  -Underwent emergent heart cath with PCI and drug-eluting stent -Continue aspirin, Brilinta, Crestor, hydralazine, Imdur, coreg  -Follow-up in heart failure clinic  Acute systolic heart failure with cardiogenic shock -Patient underwent CRRT during hospitalization.  Hemodialysis has since been discontinued -Continue Lasix PRN, spironolactone, losartan, coreg   Status post V. fib arrest, anoxic brain injury -MRI brain: microembolic infarcts and EEG showed triphasic morphology with diffuse encephalopathy -Cognition has been slowly improving -Continue provigil   Pseudomonas UTI -Completed treatment  AKI -Likely ATN and cardiogenic shock -No further hemodialysis plan per nephrology -Continues to improve/stabilize  Nutrition -Cortrak removed.  Patient has been eating well and p.o. intake significantly improved.  Hypokalemia -Replaced     In agreement with assessment of the pressure ulcer as below:  Pressure Injury 01/20/21 Heel Left Deep Tissue Pressure Injury - Purple or maroon localized area of discolored intact skin or blood-filled blister due to damage of underlying soft tissue from pressure and/or shear. (Active)  01/20/21 0100  Location: Heel  Location Orientation: Left  Staging: Deep Tissue Pressure Injury - Purple or maroon localized area of discolored intact skin or blood-filled blister due to damage of underlying soft tissue from pressure and/or shear.  Wound Description (Comments):   Present on Admission: No    Nutrition Problem: Inadequate oral intake Etiology: acute illness  Discharge Instructions  Discharge Instructions    (HEART FAILURE PATIENTS) Call MD:  Anytime you have any of the following symptoms: 1) 3 pound weight gain in 24 hours or 5 pounds in 1 week 2) shortness of breath, with or without a dry hacking cough 3) swelling in the hands, feet or stomach 4) if you  have to sleep on extra pillows at night in order  to breathe.   Complete by: As directed    AMB Referral to Cardiac Rehabilitation - Phase II   Complete by: As directed    Diagnosis:  Coronary Stents STEMI     After initial evaluation and assessments completed: Virtual Based Care may be provided alone or in conjunction with Phase 2 Cardiac Rehab based on patient barriers.: Yes   Call MD for:  difficulty breathing, headache or visual disturbances   Complete by: As directed    Call MD for:  extreme fatigue   Complete by: As directed    Call MD for:  persistant dizziness or light-headedness   Complete by: As directed    Call MD for:  persistant nausea and vomiting   Complete by: As directed    Call MD for:  severe uncontrolled pain   Complete by: As directed    Call MD for:  temperature >100.4   Complete by: As directed    Diet - low sodium heart healthy   Complete by: As directed    Discharge instructions   Complete by: As directed    You were cared for by a hospitalist during your hospital stay. If you have any questions about your discharge medications or the care you received while you were in the hospital after you are discharged, you can call the unit and ask to speak with the hospitalist on call if the hospitalist that took care of you is not available. Once you are discharged, your primary care physician will handle any further medical issues. Please note that NO REFILLS for any discharge medications will be authorized once you are discharged, as it is imperative that you return to your primary care physician (or establish a relationship with a primary care physician if you do not have one) for your aftercare needs so that they can reassess your need for medications and monitor your lab values.   Increase activity slowly   Complete by: As directed    No wound care   Complete by: As directed      Allergies as of 02/07/2021      Reactions   Codeine Other (See Comments)    hallucinations   Lisinopril Cough      Medication List    STOP taking these medications   acetaminophen 325 MG tablet Commonly known as: TYLENOL   amLODipine 5 MG tablet Commonly known as: NORVASC   cyclobenzaprine 10 MG tablet Commonly known as: FLEXERIL   DULoxetine 20 MG capsule Commonly known as: CYMBALTA   finasteride 5 MG tablet Commonly known as: [name]   Fluad 0.5 ML Susy Generic drug: Influenza Vac A&B Surf Ant Adj   LORazepam 2 MG tablet Commonly known as: ATIVAN   magic mouthwash Soln   Melatonin 10 MG Tabs   omeprazole 20 MG capsule Commonly known as: PRILOSEC   tamsulosin 0.4 MG Caps capsule Commonly known as: FLOMAX   triamcinolone cream 0.1 % Commonly known as: KENALOG     TAKE these medications   aspirin 81 MG chewable tablet Chew 1 tablet (81 mg total) by mouth daily. Start taking on: Feb 08, 2021   carvedilol 3.125 MG tablet Commonly known as: COREG Take 1 tablet (3.125 mg total) by mouth 2 (two) times daily with a meal.   furosemide 40 MG tablet Commonly known as: Lasix Take 1 tablet (40 mg total) by mouth daily as needed for fluid or edema (weight > 180 lb).   hydrALAZINE 25 MG tablet Commonly known as: APRESOLINE  Take 1 tablet (25 mg total) by mouth every 8 (eight) hours.   isosorbide mononitrate 30 MG 24 hr tablet Commonly known as: IMDUR Take 1 tablet (30 mg total) by mouth daily. Start taking on: Feb 08, 2021   losartan 25 MG tablet Commonly known as: COZAAR Take 1 tablet (25 mg total) by mouth daily. Start taking on: Feb 08, 2021 What changed:   medication strength  how much to take   modafinil 100 MG tablet Commonly known as: PROVIGIL Take 1 tablet (100 mg total) by mouth daily. Start taking on: Feb 08, 2021   Potassium Chloride ER 20 MEQ Tbcr Take 20 mEq by mouth daily as needed. Only when taking lasix as needed.   rosuvastatin 10 MG tablet Commonly known as: CRESTOR Take 1 tablet (10 mg total) by mouth  daily. Start taking on: Feb 08, 2021 What changed:   medication strength  See the new instructions.   spironolactone 25 MG tablet Commonly known as: ALDACTONE Take 0.5 tablets (12.5 mg total) by mouth daily. Start taking on: Feb 08, 2021   ticagrelor 90 MG Tabs tablet Commonly known as: BRILINTA Place 1 tablet (90 mg total) into feeding tube 2 (two) times daily.            Durable Medical Equipment  (From admission, onward)         Start     Ordered   02/06/21 1551  For home use only DME Walker rolling  Once       Question Answer Comment  Walker: With [facility] [employer]   Patient needs a walker to treat with the following condition Physical deconditioning      02/06/21 1550   02/06/21 1551  For home use only DME 3 n 1  Once        02/06/21 1550          Contact information for follow-up providers    [facility] Follow up.   Specialty: Cardiology Why: Tuesday March 05, 2021 at 10:30 AM At the [facility], Entrance C Parking Garage Code 4233 Contact information: [id number] [place]       [name], MD Follow up.   Specialty: Family Medicine Why: Hospital follow up scheduled for Thursday 02/14/2021 at 10:15am please arrive at 10:00am Contact information: [place] [address] [phone]        [facility], [facility] Follow up.   Specialty: Home Health Services Why: HHPT, [place] Contact information: [place] [address] [phone]        [facility], [facility] Follow up.   Why: rolling walker Contact information: [place] [address] [phone]            Contact information for after-discharge care    Destination    [facility] Preferred SNF .   Service: Skilled Nursing Contact information: [id number]  [place] [address] [phone]                 Allergies  Allergen Reactions  . Codeine Other (See Comments)    hallucinations  . Lisinopril Cough      Procedures/Studies: DG Chest 1 View  Result Date: 01/13/2021 CLINICAL DATA:  History of ET tube EXAM: CHEST  1 VIEW COMPARISON:  January 11, 2021 FINDINGS: The ETT terminates in  good position. The left central line is stable terminating in the SVC. The right IJ sheath remains. The NG tube terminates below today's film. No pneumothorax. Opacity in the medial right lung base is stable to mildly worsened in the interval. Mild opacity in left base is more prominent the interval. No other acute abnormalities. IMPRESSION: 1. Bibasilar opacities are mildly worsened in the interval worrisome for pneumonia. Recommend attention on follow-up. 2. Support apparatus as above. 3. No other acute abnormalities. Electronically Signed   By: Dorise Bullion III M.D   On: 01/13/2021 14:15   DG Chest 2 View  Result Date: 01/25/2021 CLINICAL DATA:  Fever. EXAM: CHEST - 2 VIEW COMPARISON:  Chest x-ray dated Jan 20, 2021. FINDINGS: Interval extubation. Unchanged feeding tube and tunneled left internal jugular dialysis catheter. The heart size and mediastinal contours are within normal limits. Normal pulmonary vascularity. Minimal bibasilar atelectasis. No focal consolidation, pleural effusion, or pneumothorax. No acute osseous abnormality. IMPRESSION: 1. Interval extubation.  No active cardiopulmonary disease. Electronically Signed   By: Titus Dubin M.D.   On: 01/25/2021 12:42   DG Abd 1 View  Result Date: 01/13/2021 CLINICAL DATA:  Evaluate OG tube EXAM: ABDOMEN - 1 VIEW COMPARISON:  January 09, 2021 FINDINGS: The OG tube is been pulled back in the interval. The distal tip is in the body of the stomach. The side port is near the GE junction. IMPRESSION: The NG tube is been pulled back in the interval the side port is now near the GE  junction. Recommend advancing several cm. Electronically Signed   By: Dorise Bullion III M.D   On: 01/13/2021 14:15   DG Abd 1 View  Result Date: 01/09/2021 CLINICAL DATA:  Check gastric catheter placement EXAM: ABDOMEN - 1 VIEW COMPARISON:  None. FINDINGS: Gastric catheter is noted coiled within the stomach. Scattered bowel gas is noted. No obstructive changes are seen. Degenerative changes of lumbar spine are noted with scoliosis concave to the right. IMPRESSION: Gastric catheter coiled within the stomach. Electronically Signed   By: Inez Catalina M.D.   On: 01/09/2021 20:35   MR BRAIN WO CONTRAST  Result Date: 01/09/2021 CLINICAL DATA:  Previous cardiac arrest. Question anoxic brain injury. EXAM: MRI HEAD WITHOUT CONTRAST TECHNIQUE: Multiplanar, multiecho pulse sequences of the brain and surrounding structures were obtained without intravenous contrast. COMPARISON:  Head CT 2 days ago. FINDINGS: Brain: Diffusion imaging shows numerous scattered small foci infarction affecting the cortical brain consistent with micro embolic infarctions rather than hypoxic ischemic injury generally. Single small focus present in the right cerebellum. Right cerebral hemisphere shows for punctate foci scattered in the parietal region. Left hemisphere shows a small cortical infarction left parietooccipital junction region and 8-10 punctate foci scattered within the frontal and parietal lobes. Small slightly larger cortical infarctions present in the left frontal and parietal vertex regions. No evidence of mass effect or hemorrhage. No evidence of pre-existing brain abnormality. No hydrocephalus or extra-axial collection. Vascular: Major vessels at the base of the brain show flow. Skull and upper cervical spine: Negative Sinuses/Orbits: Widespread sinus opacification, often seen with mechanical ventilation. Orbits negative. Other: None IMPRESSION: 1. Numerous scattered punctate to small foci of infarction throughout the brain  consistent with micro embolic infarctions rather than hypoxic ischemic injury generally. No evidence of hemorrhage or mass effect. 2. Widespread sinus opacification, often seen with mechanical ventilation. Electronically Signed   By: Nelson Chimes M.D.   On: 01/09/2021 19:19   US RENAL  Result Date: 01/11/2021  CLINICAL DATA:  Acute kidney injury EXAM: RENAL / URINARY TRACT ULTRASOUND COMPLETE COMPARISON:  None. FINDINGS: Right Kidney: Renal measurements: 12.6 x 5.3 x 6.5 cm = volume: 226 mL. Echogenicity within normal limits. Two simple cysts at the lower pole of the right kidney measuring up to 3.3 cm and 2.9 cm, respectively. No shadowing stone or hydronephrosis visualized. Left Kidney: Renal measurements: 12.9 x 6.4 x 6.2 cm = volume: 265 mL. Echogenicity within normal limits. No mass, shadowing stone, or hydronephrosis visualized. Bladder: Decompressed by Foley catheter. Other: None. IMPRESSION: 1. Negative for obstructive uropathy. 2. Simple right renal cysts measuring up to 3.3 cm. Electronically Signed   By: Davina Poke D.O.   On: 01/11/2021 15:07   Portable Chest x-ray  Result Date: 01/20/2021 CLINICAL DATA:  Initial evaluation for endotracheal tube placement. EXAM: PORTABLE CHEST 1 VIEW COMPARISON:  Prior radiograph from 01/19/2021. FINDINGS: There has been interval placement of an endotracheal tube, tip positioned 3.9 cm above the carina. Feeding tube courses into the abdomen. Left-sided central venous catheter stable. Cardiac and mediastinal silhouettes are stable, and remains within normal limits. Aortic atherosclerosis. Lungs normally inflated and remain clear. No focal infiltrates. No edema or effusion. No pneumothorax. Osseous structures are unchanged. IMPRESSION: 1. Interval placement of endotracheal tube with tip well positioned 3.9 cm above the carina. 2. Otherwise stable appearance of the chest with no active cardiopulmonary disease identified. Electronically Signed   By: Jeannine Boga M.D.   On: 01/20/2021 02:25   DG Chest Port 1 View  Result Date: 01/19/2021 CLINICAL DATA:  Rhonchi EXAM: PORTABLE CHEST 1 VIEW COMPARISON:  01/13/2021 FINDINGS: Single frontal view of the chest demonstrates interval removal of the endotracheal tube and right internal jugular Cordis. Enteric catheter passes below diaphragm tip excluded by collimation. Left internal jugular catheter tip projects over superior vena cava. The cardiac silhouette is unremarkable. No acute airspace disease, effusion, or pneumothorax. No acute bony abnormalities. IMPRESSION: 1. Support devices as above. 2. Interval resolution of bibasilar consolidation. No acute intrathoracic process. Electronically Signed   By: Randa Ngo M.D.   On: 01/19/2021 20:45   DG CHEST PORT 1 VIEW  Result Date: 01/11/2021 CLINICAL DATA:  Central line placement EXAM: PORTABLE CHEST 1 VIEW COMPARISON:  01/08/2021 FINDINGS: Endotracheal tube in good position. Swan-Ganz catheter removed. Right jugular sheath remains in place. NG tube in the stomach. Impella device removed in the interval. Interval placement of dual lumen central venous catheter from left jugular approach. Tip in the lower SVC at the cavoatrial junction. No pneumothorax. Progression of bibasilar airspace disease. No significant effusion. Upper lobes clear. IMPRESSION: Left jugular central venous catheter tip at the cavoatrial junction. No pneumothorax Progression of bibasilar atelectasis/infiltrate. Electronically Signed   By: Franchot Gallo M.D.   On: 01/11/2021 11:34   DG Swallowing Func-Speech Pathology  Result Date: 01/27/2021 Objective Swallowing Evaluation: Type of Study: MBS-Modified Barium Swallow Study  Patient Details Name: Bemnet Caltrider MRN: [id number] Date of Birth: Jun 18, 1951 Today's Date: 01/27/2021 Time: SLP Start Time (ACUTE ONLY): [id number] -SLP Stop Time (ACUTE ONLY): 1240 SLP Time Calculation (min) (ACUTE ONLY): 25 min Past Medical History: Past Medical History:  Diagnosis Date . Anxiety  . Arthritis  . Cancer ([place]) 2010  basal cell ca, head . Cataract   bilateral . Depression  . ED (erectile dysfunction)  . GERD (gastroesophageal reflux disease)  . Hyperlipidemia  . Hypertension  . Kidney stone 07-29-11  passed  . Neuromuscular disorder (HCC)   neuropathy legs .  Vitreous floater   left eye, sees Dr. Dawna [facility] at [facility]  Past Surgical History: Past Surgical History: Procedure Laterality Date . BASAL CELL CARCINOMA EXCISION    removed from scalp . COLONOSCOPY  07/03/2017  per Dr. Havery Moros, sessile serrated polyp, repeat in 5 yrs  . HERNIA REPAIR   . LEFT HEART CATH AND CORONARY ANGIOGRAPHY N/A 01/03/2021  Procedure: LEFT HEART CATH AND CORONARY ANGIOGRAPHY;  Surgeon: Troy Sine, MD;  Location: [facility];  Service: Cardiovascular;  Laterality: N/A; . RIGHT HEART CATH N/A 01/03/2021  Procedure: RIGHT HEART CATH;  Surgeon: Jolaine Artist, MD;  Location: [facility];  Service: Cardiovascular;  Laterality: N/A; HPI: Pt is 70 y.o. with PMH of  HTN, HLD, CAD, admitted with chest pain and STEMI with bradycardia and hypotension. Pt taken for urgent cath with temporary pacer placed but pt decompensated to cardiac arrest with intubation and CPR. Stent placed and pt with further complication of cardiogenic shock with Impella placed. 4/18 seizure like activity, Impella removed 4/20, 4/20 MRI with scattered micro embolic infarctions. ETT 4/14-4/24 and reintubated 2 hrs later. Extubated 4/26. EEG (4/23) revealed moderate diffuse encephalopathy, nonspecific etiology. Chest xray (4/24) revealed:"Bibasilar opacities are mildly worsened in the interval worrisome for pneumonia". Failed [facility] Swallow Screen 2/26. Patient with subsequent reintubation on 5/2 with interval extubation by at least 5/6.  Most recent chest xray dated 05/06 was showing no acdtive cardiopulmonary disease.  Subjective: The patient was seen sitting upright in the laterial view for MBS.  Assessment / Plan / Recommendation CHL IP CLINICAL IMPRESSIONS 01/27/2021 Clinical Impression MBS was completed using thin liquids via spoon and cup, nectar thick liquids via spoon and cup, pureed material and dual textured solids.  He presented with an oral and pharyngeal dysphagia that was likely at least partially impacted by his mentation.  Intermittent issues with airway protection were noted but they were not consistent and the pentration was trace.  Suggest patient begin a dysphagia 1 (puree) diet with thin liquids.  He should take small, single sips and he may require cues to swallow at times.  Oral cavity needs to be checked to ensure he has cleared material.  Oral care needs to be diligent with a toothbrush and toothpaste at least 2-3 times per day.  ST will follow during acute stay to initiate swallowing therapy, possible diet advancement and therapeutic diet tolerance.  He may require ST follow up at the next level of care.  See below for information regarding swallowing physiology.  ORAL PHASE  -Slow/decreased lingual motion leading to a delay in A/P transport of boluses.  At times he was noted to orally hold material requiring cues to swallow.  This was particularly evident during pureed trials.  -Good lingual control during directed bolus hold with little premature spillage noted.  -Slow but functional mastication of dual textured solids.  PHARYNGEAL PHASE  -Timely swallow trigger.  Material noted in the vallecula prior to the swallow trigger. -Intermittently reduced laryngeal elevation.  -Reduced base of tongue retraction and pharyngeal stripping led to decreased epiglottic inversion and pharyngeal residue in the vallecula.  Mild-moderate residue was seen given thin and nectar thick liquids as well as pureed material.  Severe vallecula residue was seen given dual textured solids.  He was not sensate to this residue.  Cued dry swallow was not successful to clear this material.  Liquid wash was able to  clear it using nectar thick liquids.  -Trace penetration after the swallow  given thin liquids via spoon sips.  There may have been some trace penetration during the swallow given thin liquids via cup sip on 1/2 trials, however, it may have been residue left from the previously penetrated thin liquids via spoon sips.  -Trace penetration during the swallow given 2/3 trials of nectar thick liquids via cup sips.  ESOPHAGEAL PHASE  -Sweep revealed it slow to clear.  SLP Visit Diagnosis Dysphagia, oropharyngeal phase (R13.12) Attention and concentration deficit following -- Frontal lobe and executive function deficit following -- Impact on safety and function Mild aspiration risk   CHL IP TREATMENT RECOMMENDATION 01/27/2021 Treatment Recommendations Therapy as outlined in treatment plan below   Prognosis 01/27/2021 Prognosis for Safe Diet Advancement Good Barriers to Reach Goals Cognitive deficits Barriers/Prognosis Comment -- CHL IP DIET RECOMMENDATION 01/27/2021 SLP Diet Recommendations Dysphagia 1 (Puree) solids;Thin liquid Liquid Administration via Cup Medication Administration Crushed with puree Compensations Slow rate;Small sips/bites;Follow solids with liquid Postural Changes Remain semi-upright after after feeds/meals (Comment);Seated upright at 90 degrees   CHL IP OTHER RECOMMENDATIONS 01/27/2021 Recommended Consults -- Oral Care Recommendations Oral care BID;Oral care before and after PO Other Recommendations --   CHL IP FOLLOW UP RECOMMENDATIONS 01/27/2021 Follow up Recommendations Inpatient Rehab   CHL IP FREQUENCY AND DURATION 01/27/2021 Speech Therapy Frequency (ACUTE ONLY) min 2x/week Treatment Duration 2 weeks      CHL IP ORAL PHASE 01/27/2021 Oral Phase Impaired Oral - Pudding Teaspoon -- Oral - Pudding Cup -- Oral - Honey Teaspoon -- Oral - Honey Cup -- Oral - Nectar Teaspoon Holding of bolus Oral - Nectar Cup Holding of bolus Oral - Nectar Straw -- Oral - Thin Teaspoon Holding of bolus;Premature spillage Oral - Thin  Cup Holding of bolus;Premature spillage Oral - Thin Straw -- Oral - Puree Holding of bolus Oral - Mech Soft Delayed oral transit Oral - Regular -- Oral - Multi-Consistency -- Oral - Pill -- Oral Phase - Comment --  CHL IP PHARYNGEAL PHASE 01/27/2021 Pharyngeal Phase Impaired Pharyngeal- Pudding Teaspoon -- Pharyngeal -- Pharyngeal- Pudding Cup -- Pharyngeal -- Pharyngeal- Honey Teaspoon -- Pharyngeal -- Pharyngeal- Honey Cup -- Pharyngeal -- Pharyngeal- Nectar Teaspoon Reduced epiglottic inversion;Reduced laryngeal elevation;Reduced tongue base retraction;Pharyngeal residue - valleculae Pharyngeal -- Pharyngeal- Nectar Cup Reduced laryngeal elevation;Reduced tongue base retraction;Pharyngeal residue - valleculae Pharyngeal -- Pharyngeal- Nectar Straw -- Pharyngeal -- Pharyngeal- Thin Teaspoon Reduced epiglottic inversion;Reduced laryngeal elevation;Penetration/Apiration after swallow;Pharyngeal residue - valleculae Pharyngeal Material enters airway, remains ABOVE vocal cords and not ejected out Pharyngeal- Thin Cup Reduced epiglottic inversion;Reduced laryngeal elevation;Reduced tongue base retraction;Pharyngeal residue - valleculae;Penetration/Aspiration during swallow Pharyngeal Material enters airway, remains ABOVE vocal cords and not ejected out Pharyngeal- Thin Straw -- Pharyngeal -- Pharyngeal- Puree Pharyngeal residue - valleculae Pharyngeal -- Pharyngeal- Mechanical Soft Reduced epiglottic inversion;Reduced laryngeal elevation;Reduced tongue base retraction;Pharyngeal residue - valleculae Pharyngeal -- Pharyngeal- Regular -- Pharyngeal -- Pharyngeal- Multi-consistency -- Pharyngeal -- Pharyngeal- Pill -- Pharyngeal -- Pharyngeal Comment --  CHL IP CERVICAL ESOPHAGEAL PHASE 01/27/2021 Cervical Esophageal Phase WFL Pudding Teaspoon -- Pudding Cup -- Honey Teaspoon -- Honey Cup -- Nectar Teaspoon -- Nectar Cup -- Nectar Straw -- Thin Teaspoon -- Thin Cup -- Thin Straw -- Puree -- Mechanical Soft -- Regular --  Multi-consistency -- Pill -- Cervical Esophageal Comment -- Shelly Flatten, MA, CCC-SLP Acute Rehab SLP [phone] Lamar Sprinkles 01/27/2021, 1:28 PM              Overnight EEG with video  Result Date: 01/12/2021 Lora Havens, MD  01/12/2021  3:34 PM Patient Name:Markeis Guibord [id number] Epilepsy Attending:Priyanka Barbra Sarks Referring Physician/Provider:Dr. Zeb Comfort Duration:01/11/2021 1633 to 01/12/2021 0933  Patient history:70 year old male with seizure-like episode. EEG to evaluate forseizures.  Level of alertness:Lethargic  AEDs during EEG study:LEV  Technical aspects: This EEG study was done with scalp electrodes positioned according to the 10-20 International system of electrode placement. Electrical activity was acquired at a sampling rate of 500Hz  and reviewed with a high frequency filter of 70Hz  and a low frequency filter of 1Hz . EEG data were recorded continuously and digitally stored.  Description:EEG showed continuous generalized predominantly 5 to 7 Hz theta as well as intermittent 2-3hz  delta slowing. Event button was pressed on 01/11/2021 at [facility] for unclear reasons.Concomitant EEG before, during and after the event didn't show any eeg change to suggest seizure. Hyperventilation and photic stimulation were not performed.   ABNORMALITY - Continuous slow, generalized  IMPRESSION: This studyis suggestive of moderate diffuse encephalopathy, nonspecific etiology.  Event button was pressed on 01/11/2021 at [facility] for unclear reasons without concomitant EEG change and were most likely NOT epileptic.   Lora Havens      Discharge Exam: Vitals:   02/07/21 0446 02/07/21 0733  BP: 127/60 131/75  Pulse: 85 76  Resp: 18   Temp: 98.8 F (37.1 C) 98.6 F (37 C)  SpO2: 98% 97%    General: Pt is alert, awake, not in acute distress Cardiovascular: RRR, S1/S2 +, no edema Respiratory: CTA bilaterally, no wheezing, no rhonchi, no  respiratory distress, no conversational dyspnea, on room air  Abdominal: Soft, NT, ND, bowel sounds + Extremities: no edema, no cyanosis Psych: Normal mood and affect, stable judgement and insight     The results of significant diagnostics from this hospitalization (including imaging, microbiology, ancillary and laboratory) are listed below for reference.     Microbiology: Recent Results (from the past 240 hour(s))  SARS CORONAVIRUS 2 (TAT 6-24 HRS) Nasopharyngeal Nasopharyngeal Swab     Status: None   Collection Time: 02/06/21  9:25 AM   Specimen: Nasopharyngeal Swab  Result Value Ref Range Status   SARS Coronavirus 2 NEGATIVE NEGATIVE Final    Comment: (NOTE) SARS-CoV-2 target nucleic acids are NOT DETECTED.  The SARS-CoV-2 RNA is generally detectable in upper and lower respiratory specimens during the acute phase of infection. Negative results do not preclude SARS-CoV-2 infection, do not rule out co-infections with other pathogens, and should not be used as the sole basis for treatment or other patient management decisions. Negative results must be combined with clinical observations, patient history, and epidemiological information. The expected result is Negative.  Fact Sheet for Patients: SugarRoll.be  Fact Sheet for Healthcare Providers: https://www.woods-mathews.com/  This test is not yet approved or cleared by the [place] FDA and  has been authorized for detection and/or diagnosis of SARS-CoV-2 by FDA under an Emergency Use Authorization (EUA). This EUA will remain  in effect (meaning this test can be used) for the duration of the COVID-19 declaration under Se ction 564(b)(1) of the Act, 21 U.S.C. section 360bbb-3(b)(1), unless the authorization is terminated or revoked sooner.  Performed at [facility], [place] [address], [place], [place] [address]      Labs: BNP (last 3 results) No results for input(s): BNP  in the last 8760 hours. Basic Metabolic Panel: Recent Labs  Lab 02/02/21 0259 02/03/21 0307 02/04/21 0639 02/05/21 0345 02/06/21 0453 02/07/21 0303  NA 136 136 137 136 136 137  K  3.9 3.5 3.5 3.6 3.7 3.4*  CL 109 110 108 109 109 108  CO2 17* 17* 20* 19* 20* 20*  GLUCOSE 103* 89 111* 95 93 91  BUN 41* 32* 26* 25* 25* 26*  CREATININE 1.90* 1.70* 1.76* 1.82* 1.71* 1.75*  CALCIUM 8.5* 8.5* 8.5* 8.2* 8.5* 8.5*  MG 1.7 1.6* 2.0 1.7 1.6*  --    Liver Function Tests: Recent Labs  Lab 02/03/21 0307  AST 18  ALT 45*  ALKPHOS 75  BILITOT 0.9  PROT 6.0*  ALBUMIN 2.6*   No results for input(s): LIPASE, AMYLASE in the last 168 hours. No results for input(s): AMMONIA in the last 168 hours. CBC: Recent Labs  Lab 02/01/21 0935 02/03/21 0307 02/06/21 0453  WBC 10.6* 12.6* 7.8  HGB 8.1* 8.2* 8.0*  HCT 24.8* 25.8* 25.5*  MCV 98.0 95.9 97.0  PLT 196 185 149*   Cardiac Enzymes: No results for input(s): CKTOTAL, CKMB, CKMBINDEX, TROPONINI in the last 168 hours. BNP: Invalid input(s): POCBNP CBG: Recent Labs  Lab 02/06/21 0607 02/06/21 1121 02/06/21 1531 02/06/21 2044 02/07/21 0549  GLUCAP 100* 94 132* 96 86   D-Dimer No results for input(s): DDIMER in the last 72 hours. Hgb A1c No results for input(s): HGBA1C in the last 72 hours. Lipid Profile No results for input(s): CHOL, HDL, LDLCALC, TRIG, CHOLHDL, LDLDIRECT in the last 72 hours. Thyroid function studies No results for input(s): TSH, T4TOTAL, T3FREE, THYROIDAB in the last 72 hours.  Invalid input(s): FREET3 Anemia work up No results for input(s): VITAMINB12, FOLATE, FERRITIN, TIBC, IRON, RETICCTPCT in the last 72 hours. Urinalysis    Component Value Date/Time   COLORURINE YELLOW 01/25/2021 1158   APPEARANCEUR CLOUDY (A) 01/25/2021 1158   LABSPEC 1.015 01/25/2021 1158   PHURINE 5.0 01/25/2021 1158   GLUCOSEU NEGATIVE 01/25/2021 1158   HGBUR MODERATE (A) 01/25/2021 1158   HGBUR negative 06/13/2010 0816    BILIRUBINUR NEGATIVE 01/25/2021 1158   BILIRUBINUR n 05/17/2019 0927   KETONESUR NEGATIVE 01/25/2021 1158   PROTEINUR NEGATIVE 01/25/2021 1158   UROBILINOGEN 0.2 05/17/2019 0927   UROBILINOGEN 0.2 06/13/2010 0816   NITRITE NEGATIVE 01/25/2021 1158   LEUKOCYTESUR LARGE (A) 01/25/2021 1158   Sepsis Labs Invalid input(s): PROCALCITONIN,  WBC,  LACTICIDVEN Microbiology Recent Results (from the past 240 hour(s))  SARS CORONAVIRUS 2 (TAT 6-24 HRS) Nasopharyngeal Nasopharyngeal Swab     Status: None   Collection Time: 02/06/21  9:25 AM   Specimen: Nasopharyngeal Swab  Result Value Ref Range Status   SARS Coronavirus 2 NEGATIVE NEGATIVE Final    Comment: (NOTE) SARS-CoV-2 target nucleic acids are NOT DETECTED.  The SARS-CoV-2 RNA is generally detectable in upper and lower respiratory specimens during the acute phase of infection. Negative results do not preclude SARS-CoV-2 infection, do not rule out co-infections with other pathogens, and should not be used as the sole basis for treatment or other patient management decisions. Negative results must be combined with clinical observations, patient history, and epidemiological information. The expected result is Negative.  Fact Sheet for Patients: SugarRoll.be  Fact Sheet for Healthcare Providers: https://www.woods-mathews.com/  This test is not yet approved or cleared by the [place] FDA and  has been authorized for detection and/or diagnosis of SARS-CoV-2 by FDA under an Emergency Use Authorization (EUA). This EUA will remain  in effect (meaning this test can be used) for the duration of the COVID-19 declaration under Se ction 564(b)(1) of the Act, 21 U.S.C. section 360bbb-3(b)(1), unless the authorization is terminated  or revoked sooner.  Performed at [facility], [place] [address] [place], [place] [address]      Patient was seen and examined on the day of discharge and was  found to be in stable condition. Time coordinating discharge: 35 minutes including assessment and coordination of care, as well as examination of the patient.   SIGNED:  Dessa Phi, DO [facility] 02/07/2021, 10:57 AM

## 2021-02-07 NOTE — Plan of Care (Signed)

## 2021-02-07 NOTE — Progress Notes (Signed)
Occupational Therapy Treatment Patient Details Name: [name] MRN: [id number] DOB: 07/05/1951 Today's Date: 02/07/2021    History of present illness Pt is 70 yo admitted 4/14 with chest pain and STEMI with bradycardia and hypotension. Pt taken for urgent cath with temporary pacer placed but pt decompensated to cardiac arrest with intubation and CPR. Stent placed and pt with further complication of cardiogenic shock with Impella placed. 4/18 seizure like activity, Impella removed 4/20, 4/20 MRI with scattered micro embolic infarctions. Extubated 4/24 and reintubated 2 hrs later. Extubated 4/26. Reintubated 01/20/21. Extubated 01/23/21.  PMHx: HTN, HLD, CAD   OT comments  Pt sitting EOB upon arrival. Pt planning to d/c home today vs SNF. Session focused on UB/LB dressing, donning shoes, using RW for ADL functional mobility safety. Pt educated home safety and  on tub bench for home use with handout provided. RW has been delivered to pt's room, 3 in 1 to be delivered to his home  Follow Up Recommendations  Other (comment) (now going home with [facility])    Equipment Recommendations  3 in 1 bedside commode;Tub/shower bench    Recommendations for Other Services      Precautions / Restrictions Precautions Precautions: Fall;Other (comment) Restrictions Weight Bearing Restrictions: No       Mobility Bed Mobility Overal bed mobility: Needs Assistance             General bed mobility comments: pt sitting EOB upon arrival    Transfers Overall transfer level: Needs assistance Equipment used: None Transfers: Sit to/from Stand Sit to Stand: Supervision Stand pivot transfers: Supervision       General transfer comment: Supervision for safety    Balance Overall balance assessment: Needs assistance Sitting-balance support: Feet supported;No upper extremity supported Sitting balance-Leahy Scale: Good     Standing balance support: During functional activity;No upper extremity  supported;Single extremity supported Standing balance-Leahy Scale: Fair                             ADL either performed or assessed with clinical judgement   ADL Overall ADL's : Needs assistance/impaired     Grooming: Wash/dry hands;Wash/dry face;Standing;Set up;Supervision/safety       Lower Body Bathing: Min guard;Sit to/from stand       Lower Body Dressing: Sit to/from stand;Sitting/lateral leans;Min guard   Toilet Transfer: RW;Ambulation;Comfort height toilet;Grab bars;Cueing for safety;Supervision/safety   Toileting- Clothing Manipulation and Hygiene: Supervision/safety;Sit to/from stand       Functional mobility during ADLs: Rolling walker;Cueing for sequencing;Cueing for safety;Supervision/safety General ADL Comments: Pt dressed self with tshirt, pants and tennis shoes min guard sitting/sit - stand     Vision Baseline Vision/History: Wears glasses Patient Visual Report: No change from baseline     Perception     Praxis      Cognition Arousal/Alertness: Awake/alert Behavior During Therapy: WFL for tasks assessed/performed Overall Cognitive Status: Impaired/Different from baseline Area of Impairment: Memory;Following commands;Attention;Problem solving                     Memory: Decreased short-term memory Following Commands: Follows one step commands consistently Safety/Judgement: Decreased awareness of safety   Problem Solving: Requires verbal cues;Difficulty sequencing          Exercises     Shoulder Instructions       General Comments      Pertinent Vitals/ Pain       Pain Assessment: No/denies pain Pain Score: 0-No pain Pain  Intervention(s): Monitored during session  Home Living                                          Prior Functioning/Environment              Frequency  Min 2X/week        Progress Toward Goals  OT Goals(current goals can now be found in the care plan section)   Progress towards OT goals: Progressing toward goals  Acute Rehab OT Goals Patient Stated Goal: go home  Plan Discharge plan needs to be updated    Co-evaluation                 AM-PAC OT "6 Clicks" Daily Activity     Outcome Measure   Help from another person eating meals?: None Help from another person taking care of personal grooming?: A Little Help from another person toileting, which includes using toliet, bedpan, or urinal?: A Little Help from another person bathing (including washing, rinsing, drying)?: A Little Help from another person to put on and taking off regular upper body clothing?: None Help from another person to put on and taking off regular lower body clothing?: A Little 6 Click Score: 20    End of Session Equipment Utilized During Treatment: Gait belt;Rolling walker  OT Visit Diagnosis: Muscle weakness (generalized) (M62.81);Other abnormalities of gait and mobility (R26.89);Adult, failure to thrive (R62.7);Other symptoms and signs involving cognitive function Symptoms and signs involving cognitive functions: Cerebral infarction Hemiplegia - Right/Left: Right Hemiplegia - dominant/non-dominant: Dominant Hemiplegia - caused by: Cerebral infarction   Activity Tolerance Patient tolerated treatment well   Patient Left in bed;with call bell/phone within reach;Other (comment) (sitting EOB)   Nurse Communication          Time: 2841-3244 OT Time Calculation (min): 25 min  Charges: OT General Charges $OT Visit: 1 Visit OT Treatments $Self Care/Home Management : 8-22 mins $Therapeutic Activity: 8-22 mins     Britt Bottom 02/07/2021, 1:55 PM

## 2021-02-07 NOTE — Plan of Care (Signed)
Problem: Education: Goal: Knowledge of General Education information will improve Description: Including pain rating scale, medication(s)/side effects and non-pharmacologic comfort measures 02/07/2021 1058 by Camillia Herter, RN Outcome: Adequate for Discharge 02/07/2021 0724 by Camillia Herter, RN Outcome: Progressing   Problem: Health Behavior/Discharge Planning: Goal: Ability to manage health-related needs will improve 02/07/2021 1058 by Camillia Herter, RN Outcome: Adequate for Discharge 02/07/2021 0724 by Camillia Herter, RN Outcome: Progressing   Problem: Clinical Measurements: Goal: Ability to maintain clinical measurements within normal limits will improve 02/07/2021 1058 by Camillia Herter, RN Outcome: Adequate for Discharge 02/07/2021 0724 by Camillia Herter, RN Outcome: Progressing Goal: Will remain free from infection 02/07/2021 1058 by Camillia Herter, RN Outcome: Adequate for Discharge 02/07/2021 0724 by Camillia Herter, RN Outcome: Progressing Goal: Diagnostic test results will improve 02/07/2021 1058 by Camillia Herter, RN Outcome: Adequate for Discharge 02/07/2021 0724 by Camillia Herter, RN Outcome: Progressing Goal: Respiratory complications will improve 02/07/2021 1058 by Camillia Herter, RN Outcome: Adequate for Discharge 02/07/2021 0724 by Camillia Herter, RN Outcome: Progressing Goal: Cardiovascular complication will be avoided 02/07/2021 1058 by Camillia Herter, RN Outcome: Adequate for Discharge 02/07/2021 0724 by Camillia Herter, RN Outcome: Progressing   Problem: Activity: Goal: Risk for activity intolerance will decrease 02/07/2021 1058 by Camillia Herter, RN Outcome: Adequate for Discharge 02/07/2021 0724 by Camillia Herter, RN Outcome: Progressing   Problem: Nutrition: Goal: Adequate nutrition will be maintained 02/07/2021 1058 by Camillia Herter, RN Outcome: Adequate for Discharge 02/07/2021 0724 by Camillia Herter, RN Outcome: Progressing   Problem:  Coping: Goal: Level of anxiety will decrease 02/07/2021 1058 by Camillia Herter, RN Outcome: Adequate for Discharge 02/07/2021 0724 by Camillia Herter, RN Outcome: Progressing   Problem: Elimination: Goal: Will not experience complications related to bowel motility 02/07/2021 1058 by Camillia Herter, RN Outcome: Adequate for Discharge 02/07/2021 0724 by Camillia Herter, RN Outcome: Progressing   Problem: Safety: Goal: Ability to remain free from injury will improve 02/07/2021 1058 by Camillia Herter, RN Outcome: Adequate for Discharge 02/07/2021 0724 by Camillia Herter, RN Outcome: Progressing   Problem: Skin Integrity: Goal: Risk for impaired skin integrity will decrease 02/07/2021 1058 by Camillia Herter, RN Outcome: Adequate for Discharge 02/07/2021 0724 by Camillia Herter, RN Outcome: Progressing   Problem: Education: Goal: Understanding of cardiac disease, CV risk reduction, and recovery process will improve 02/07/2021 1058 by Camillia Herter, RN Outcome: Adequate for Discharge 02/07/2021 0724 by Camillia Herter, RN Outcome: Progressing Goal: Understanding of medication regimen will improve 02/07/2021 1058 by Camillia Herter, RN Outcome: Adequate for Discharge 02/07/2021 0724 by Camillia Herter, RN Outcome: Progressing Goal: Individualized Educational Video(s) 02/07/2021 1058 by Camillia Herter, RN Outcome: Adequate for Discharge 02/07/2021 0724 by Camillia Herter, RN Outcome: Progressing   Problem: Activity: Goal: Ability to tolerate increased activity will improve 02/07/2021 1058 by Camillia Herter, RN Outcome: Adequate for Discharge 02/07/2021 0724 by Camillia Herter, RN Outcome: Progressing   Problem: Cardiac: Goal: Ability to achieve and maintain adequate cardiopulmonary perfusion will improve 02/07/2021 1058 by Camillia Herter, RN Outcome: Adequate for Discharge 02/07/2021 0724 by Camillia Herter, RN Outcome: Progressing   Problem: Health Behavior/Discharge Planning: Goal: Ability  to safely manage health-related needs after discharge will improve 02/07/2021 1058 by Camillia Herter, RN Outcome: Adequate for Discharge 02/07/2021 0724 by Camillia Herter, RN Outcome: Progressing  Problem: Activity: Goal: Ability to tolerate increased activity will improve 02/07/2021 1058 by Camillia Herter, RN Outcome: Adequate for Discharge 02/07/2021 0724 by Camillia Herter, RN Outcome: Progressing   Problem: Respiratory: Goal: Ability to maintain a clear airway and adequate ventilation will improve 02/07/2021 1058 by Camillia Herter, RN Outcome: Adequate for Discharge 02/07/2021 0724 by Camillia Herter, RN Outcome: Progressing   Problem: Education: Goal: Ability to demonstrate management of disease process will improve 02/07/2021 1058 by Camillia Herter, RN Outcome: Adequate for Discharge 02/07/2021 0724 by Camillia Herter, RN Outcome: Progressing Goal: Ability to verbalize understanding of medication therapies will improve 02/07/2021 1058 by Camillia Herter, RN Outcome: Adequate for Discharge 02/07/2021 0724 by Camillia Herter, RN Outcome: Progressing   Problem: Activity: Goal: Capacity to carry out activities will improve 02/07/2021 1058 by Camillia Herter, RN Outcome: Adequate for Discharge 02/07/2021 0724 by Camillia Herter, RN Outcome: Progressing   Problem: Cardiac: Goal: Ability to achieve and maintain adequate cardiopulmonary perfusion will improve 02/07/2021 1058 by Camillia Herter, RN Outcome: Adequate for Discharge 02/07/2021 0724 by Camillia Herter, RN Outcome: Progressing

## 2021-02-07 NOTE — Progress Notes (Signed)
Called [facility] transport and requested door-to-door transport for arrival time of approx 1-1:30pm per pt's request.

## 2021-02-07 NOTE — Plan of Care (Signed)
  Problem: Education: Goal: Knowledge of General Education information will improve Description: Including pain rating scale, medication(s)/side effects and non-pharmacologic comfort measures Outcome: Progressing   Problem: Health Behavior/Discharge Planning: Goal: Ability to manage health-related needs will improve Outcome: Progressing   Problem: Clinical Measurements: Goal: Ability to maintain clinical measurements within normal limits will improve Outcome: Progressing Goal: Will remain free from infection Outcome: Progressing Goal: Diagnostic test results will improve Outcome: Progressing Goal: Respiratory complications will improve Outcome: Progressing Goal: Cardiovascular complication will be avoided Outcome: Progressing   Problem: Activity: Goal: Risk for activity intolerance will decrease Outcome: Progressing   Problem: Nutrition: Goal: Adequate nutrition will be maintained Outcome: Progressing   Problem: Coping: Goal: Level of anxiety will decrease Outcome: Progressing   Problem: Elimination: Goal: Will not experience complications related to bowel motility Outcome: Progressing   Problem: Safety: Goal: Ability to remain free from injury will improve Outcome: Progressing   Problem: Skin Integrity: Goal: Risk for impaired skin integrity will decrease Outcome: Progressing   Problem: Education: Goal: Understanding of cardiac disease, CV risk reduction, and recovery process will improve Outcome: Progressing Goal: Understanding of medication regimen will improve Outcome: Progressing Goal: Individualized Educational Video(s) Outcome: Progressing   Problem: Activity: Goal: Ability to tolerate increased activity will improve Outcome: Progressing   Problem: Cardiac: Goal: Ability to achieve and maintain adequate cardiopulmonary perfusion will improve Outcome: Progressing   Problem: Health Behavior/Discharge Planning: Goal: Ability to safely manage  health-related needs after discharge will improve Outcome: Progressing   Problem: Activity: Goal: Ability to tolerate increased activity will improve Outcome: Progressing   Problem: Respiratory: Goal: Ability to maintain a clear airway and adequate ventilation will improve Outcome: Progressing   Problem: Education: Goal: Ability to demonstrate management of disease process will improve Outcome: Progressing Goal: Ability to verbalize understanding of medication therapies will improve Outcome: Progressing   Problem: Activity: Goal: Capacity to carry out activities will improve Outcome: Progressing   Problem: Cardiac: Goal: Ability to achieve and maintain adequate cardiopulmonary perfusion will improve Outcome: Progressing

## 2021-02-07 NOTE — Consult Note (Addendum)
   [facility] Inpatient Consult   02/07/2021  [name] 05-10-1951 [id number]   [place] Organization [ACO] Patient: [facility] PPO  Patient has been followed for transition of care with unit progression with inpatient [facility] team.  Patient is in an Embedded practice with [employer] in which a TOC call is listed to follow up is conducted by the primary care provider's. This patient can be referred to the  Embedded practice for chronic disease management with a  Embedded Care Management team for heart failure management.  Plan: Spoke with [facility] T[facility] LCSW [facility] team regarding  any SDOH needs, she states transportation arranged for appointment, sister assisting with food.  Will refer patient to Embedded CCM team for follow up as patient is hospital day 35 with high risk score for unplanned readmission risk.  Please contact for further questions,  Natividad Brood, RN BSN [facility] Liaison  [phone] business mobile phone Toll free office [phone]  Fax number: [phone] [email or web address]

## 2021-02-07 NOTE — TOC Transition Note (Addendum)
Transition of Care [facility]) - CM/SW Discharge Note Heart Failure   Patient Details  Name: [name] MRN: [id number] Date of Birth: 08/18/1951  Transition of Care [facility]) CM/SW Contact:  [place], [place] Phone Number: 02/07/2021, 12:15 PM   Clinical Narrative:    CSW spoke with the patient at bedside to confirm patients discharge home with home health which the nurse case manager set up with [facility] for HHPT/OT. NCM had walker delivered to the room. NCM Kristi called [employer] to have the 3 in 1 delivered to the patients home. Discharge nurse Emily set up transportation home for the patient and they will help the patient get into the home with the walker and his belongings. CSW provided patient with a pill box. Patient awaiting medications to be delivered to his room from [place]. [name] aware to follow up with the [facility] outpatient clinic and encouraged them to follow up and to attend the appointment and bring their medications and if anything changes to please reach out so that CSW/HV clinic team can provide support.  CSW will sign off for now as social work intervention is no longer needed. Please consult [place] again if new needs arise.   Final next level of care: [place] Barriers to Discharge: No Barriers Identified   Patient Goals and CMS Choice Patient states their goals for this hospitalization and ongoing recovery are:: return home CMS Medicare.gov Compare Post Acute Care list provided to:: Patient Choice offered to / list presented to : Patient  Discharge Placement                Patient to be transferred to facility by: [facility] transport to patients home Name of family member notified: HCPOA [name] Patient and family notified of of transfer: 02/07/21  Discharge Plan and Services In-house Referral: Clinical Social Work Discharge Planning Services: CM Consult Post Acute Care Choice: Home Health          DME Arranged: 3-N-1,Walker rolling DME  Agency: [employer] Date DME Agency Contacted: 02/06/21 Time DME Agency Contacted: [id number] Representative spoke with at DME Agency: [place]: PT,OT [place] Agency: [place] Date [place]: 02/06/21 Time [place]: [place] Representative spoke with at [place]: [place] ([place]) Interventions     Readmission Risk Interventions No flowsheet data found.   [name], MSW, [place] Heart Failure Social Worker

## 2021-02-08 ENCOUNTER — Telehealth: Payer: Self-pay | Admitting: *Deleted

## 2021-02-08 NOTE — Chronic Care Management (AMB) (Signed)
  Chronic Care Management   Outreach Note  02/08/2021 Name: [name] MRN: [id number] DOB: [birth year]  [name] is a 70 y.o. year old male who is a primary care patient of [name], MD. I reached out to [name] by phone today in response to a referral sent by [name]'s PCP, [name], MD.     An unsuccessful telephone outreach was attempted today. The patient was referred to the case management team for assistance with care management and care coordination.   Follow Up Plan: A HIPAA compliant phone message was left for the patient providing contact information and requesting a return call. The care management team will reach out to the patient again over the next 7 days. If patient returns call to provider office, please advise to call [place] at [phone].  [place] Management

## 2021-02-11 ENCOUNTER — Telehealth: Payer: Self-pay

## 2021-02-11 ENCOUNTER — Other Ambulatory Visit (HOSPITAL_COMMUNITY): Payer: Self-pay

## 2021-02-11 ENCOUNTER — Telehealth (HOSPITAL_COMMUNITY): Payer: Self-pay | Admitting: Pharmacist

## 2021-02-11 NOTE — Telephone Encounter (Signed)
Transition Care Management Unsuccessful Follow-up Telephone Call  Date of discharge and from where:  02/07/2021   [name]   Attempts:  1st Attempt  Reason for unsuccessful TCM follow-up call:  No answer/busy

## 2021-02-12 ENCOUNTER — Telehealth (HOSPITAL_COMMUNITY): Payer: Self-pay | Admitting: Pharmacist

## 2021-02-12 DIAGNOSIS — I2510 Atherosclerotic heart disease of native coronary artery without angina pectoris: Secondary | ICD-10-CM | POA: Diagnosis not present

## 2021-02-12 DIAGNOSIS — J9601 Acute respiratory failure with hypoxia: Secondary | ICD-10-CM | POA: Diagnosis not present

## 2021-02-12 DIAGNOSIS — I110 Hypertensive heart disease with heart failure: Secondary | ICD-10-CM | POA: Diagnosis not present

## 2021-02-12 DIAGNOSIS — M419 Scoliosis, unspecified: Secondary | ICD-10-CM | POA: Diagnosis not present

## 2021-02-12 DIAGNOSIS — I5021 Acute systolic (congestive) heart failure: Secondary | ICD-10-CM | POA: Diagnosis not present

## 2021-02-12 DIAGNOSIS — Z48812 Encounter for surgical aftercare following surgery on the circulatory system: Secondary | ICD-10-CM | POA: Diagnosis not present

## 2021-02-12 DIAGNOSIS — M47816 Spondylosis without myelopathy or radiculopathy, lumbar region: Secondary | ICD-10-CM | POA: Diagnosis not present

## 2021-02-12 DIAGNOSIS — N179 Acute kidney failure, unspecified: Secondary | ICD-10-CM | POA: Diagnosis not present

## 2021-02-12 DIAGNOSIS — I2119 ST elevation (STEMI) myocardial infarction involving other coronary artery of inferior wall: Secondary | ICD-10-CM | POA: Diagnosis not present

## 2021-02-12 NOTE — Telephone Encounter (Signed)
Patient's VM was full

## 2021-02-13 ENCOUNTER — Telehealth (HOSPITAL_COMMUNITY): Payer: Self-pay

## 2021-02-13 ENCOUNTER — Other Ambulatory Visit (HOSPITAL_COMMUNITY): Payer: Self-pay

## 2021-02-13 NOTE — Telephone Encounter (Signed)
Transitions of Care Pharmacy   Call attempted for a pharmacy transitions of care follow-up. Voicemail is full so could not leave HIPPA appropriate message at this time. Call attempt #3, will no longer attempt follow calls for [place]

## 2021-02-13 NOTE — Telephone Encounter (Signed)
Pharmacy Transitions of Care Follow-up Telephone Call  Date of discharge: 02/07/21 Discharge Diagnosis: Stent Placement  How have you been since you were released from the hospital? Patient has been doing well. Has first follow up tomorrow.   Medication changes made at discharge: START taking:  Aspirin Low Dose  Brilinta  carvedilol(COREG)  furosemide(Lasix)  hydrALAZINE(APRESOLINE)  isosorbide mononitrate(IMDUR)  modafinil(PROVIGIL)  potassium chloride SA(KLOR-CON)  spironolactone(ALDACTONE)   CHANGE how you take:  losartan(COZAAR)  rosuvastatin(CRESTOR)   STOP taking:  acetaminophen325 MG tablet(TYLENOL)  amLODipine5 MG tablet(NORVASC)  cyclobenzaprine10 MG tablet(FLEXERIL)  DULoxetine20 MG capsule(CYMBALTA)  finasteride5 MG tablet([name])  Fluad0.5 ML Susy  LORazepam2 MG tablet(ATIVAN)  magic mouthwashSoln  Melatonin10 MG Tabs  omeprazole20 MG capsule(PRILOSEC)  tamsulosin0.4 MG Caps capsule(FLOMAX)  triamcinolone cream0.1 %(KENALOG)  Medication changes verified by the patient?  Yes    Medication Accessibility:  [facility]  Was the patient provided with refills on discharged medications? no  Have all prescriptions been transferred from [facility] to home pharmacy? n/a  . Is the patient able to afford medications? yes    Medication Review: TICAGRELOR (BRILINTA) Ticagrelor 90 mg BID initiated on 02/07/21.  - Educated patient on expected duration of therapy of aspirin with ticagrelor. - Discussed importance of taking medication around the same time every day, - Reviewed potential DDIs with patient - Advised patient of medications to avoid (NSAIDs, aspirin maintenance doses>100 mg daily) - Educated that Tylenol (acetaminophen) will be the preferred analgesic to prevent risk of bleeding  - Emphasized importance of monitoring for signs and symptoms of bleeding (abnormal bruising, prolonged bleeding, nose  bleeds, bleeding from gums, discolored urine, black tarry stools)  - Educated patient to notify doctor if shortness of breath or abnormal heartbeat occur - Advised patient to alert all providers of antiplatelet therapy prior to starting a new medication or having a procedure   Follow-up Appointments:  PCP Hospital f/u appt confirmed? Dr. Sarajane Jews in [facility] on 02/14/21  Specialist: Has Cardiology appointment on 03/05/21  If their condition worsens, is the pt aware to call PCP or go to the Emergency Dept.? yes  Final Patient Assessment: Knows to get refills at next follow up tomorrow. Patient doing well since discharge.

## 2021-02-14 ENCOUNTER — Other Ambulatory Visit: Payer: Self-pay

## 2021-02-14 ENCOUNTER — Encounter: Payer: Self-pay | Admitting: Family Medicine

## 2021-02-14 ENCOUNTER — Telehealth (HOSPITAL_COMMUNITY): Payer: Self-pay

## 2021-02-14 ENCOUNTER — Ambulatory Visit: Payer: Medicare PPO | Admitting: Family Medicine

## 2021-02-14 VITALS — BP 110/78 | HR 82 | Temp 97.8°F | Wt 176.0 lb

## 2021-02-14 DIAGNOSIS — F411 Generalized anxiety disorder: Secondary | ICD-10-CM

## 2021-02-14 DIAGNOSIS — I10 Essential (primary) hypertension: Secondary | ICD-10-CM | POA: Diagnosis not present

## 2021-02-14 DIAGNOSIS — R570 Cardiogenic shock: Secondary | ICD-10-CM | POA: Diagnosis not present

## 2021-02-14 DIAGNOSIS — G9340 Encephalopathy, unspecified: Secondary | ICD-10-CM | POA: Diagnosis not present

## 2021-02-14 DIAGNOSIS — J9601 Acute respiratory failure with hypoxia: Secondary | ICD-10-CM

## 2021-02-14 DIAGNOSIS — N170 Acute kidney failure with tubular necrosis: Secondary | ICD-10-CM | POA: Diagnosis not present

## 2021-02-14 MED ORDER — NITROGLYCERIN 0.4 MG SL SUBL: 0.4000 mg | SUBLINGUAL_TABLET | SUBLINGUAL | 3 refills | Status: DC | PRN

## 2021-02-14 MED ORDER — LORAZEPAM 1 MG PO TABS: 1.0000 mg | ORAL_TABLET | Freq: Four times a day (QID) | ORAL | 1 refills | Status: DC | PRN

## 2021-02-14 NOTE — Telephone Encounter (Signed)
Pt insurance is active and benefits verified through [facility]. Co-pay $20.00, DED $0.00/$0.00 met, out of pocket $4,000.00/$215.00 met, co-insurance 0%. No pre-authorization required. Passport, 02/14/21 @ 3:03PM, ALP#[id number]  Will contact patient to see if he is interested in the Cardiac Rehab Program. If interested, patient will need to complete follow up appt. Once completed, patient will be contacted for scheduling upon review by the RN Navigator.

## 2021-02-14 NOTE — Progress Notes (Signed)
Subjective:    Patient ID: [name], male    DOB: 1951/06/23, 70 y.o.   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 He ended up having a drug eluting stent placed in the RCA. ECHO revealed a LVEF of 25-30%. He developed acute renal failure due to ATN, and he had several weeks of hemodialysis. He developed respiratory failure and he was intubated several times. He was encephalopathic for awhile, and an MRI revealed numerous microinfarctions that were due either to microemboli or hypoxic injury. He was able to be extubated, and his cognitive status improved dramatically. At DC is creatinine was down to 1.75, and his GFR was up to 41. He was sent home on ASA 81 mg daily and Brilinta 90 mg BID. He was started on Provigil to improve cognition. He is slowly getting stronger, though he admits he is still weak. His appetite is almost back to normal. He denies any SOB or chest pain, though he is deconditioned. He has signed a DNR. He is back home with home health, and he will begin PT and OT later this week. He cannot drive for the time being. He was taken off Duloxetine and Lorazepam in the hospital, and since he has been home his anxiety levels have been coming back up. He wants to get on Lorazepam again.    Review of Systems  Constitutional: Positive for fatigue.  Respiratory: Negative.   Cardiovascular: Negative.   Gastrointestinal: Negative.   Genitourinary: Negative.   Neurological: Negative.   Psychiatric/Behavioral: Positive for decreased concentration and sleep disturbance. Negative for agitation, confusion and dysphoric mood. The patient is nervous/anxious.        Objective:   Physical Exam Constitutional:      Appearance: Normal appearance.     Comments: Walks with a walker   Cardiovascular:     Rate and Rhythm: Normal rate and regular rhythm.     Pulses: Normal  pulses.     Heart sounds: Normal heart sounds.  Pulmonary:     Effort: Pulmonary effort is normal.     Breath sounds: Normal breath sounds.  Musculoskeletal:     Right lower leg: No edema.     Left lower leg: No edema.  Neurological:     Mental Status: He is alert and oriented to person, place, and time. Mental status is at baseline.  Psychiatric:        Mood and Affect: Mood normal.        Behavior: Behavior normal.        Thought Content: Thought content normal.        Judgment: Judgment normal.           Assessment & Plan:  He is recovering from a STEMI that led to cardiogenic shock, renal and respiratory failure, and encephalopathy. He is recovering quite well from all these. His mental status is not quite back to baseline, but he is getting close. We agreed he should not drive for the time being. He will see Cardiology in the heart failure clinic on 03-05-21. We will start him back on Lorazepam as needed for anxiety. We agreed to stay off Duloxetine. He will start PT and OT as above. We will see him back here in 4 weeks, and we will plan to draw labs then for renal function, etc.  We spent 50 minutes today reviewing records  and discussing these issues.  [name] Penna, MD

## 2021-02-15 ENCOUNTER — Other Ambulatory Visit: Payer: Self-pay

## 2021-02-15 ENCOUNTER — Telehealth: Payer: Self-pay | Admitting: Family Medicine

## 2021-02-15 DIAGNOSIS — I2119 ST elevation (STEMI) myocardial infarction involving other coronary artery of inferior wall: Secondary | ICD-10-CM | POA: Diagnosis not present

## 2021-02-15 DIAGNOSIS — I5021 Acute systolic (congestive) heart failure: Secondary | ICD-10-CM | POA: Diagnosis not present

## 2021-02-15 DIAGNOSIS — Z48812 Encounter for surgical aftercare following surgery on the circulatory system: Secondary | ICD-10-CM | POA: Diagnosis not present

## 2021-02-15 DIAGNOSIS — J9601 Acute respiratory failure with hypoxia: Secondary | ICD-10-CM | POA: Diagnosis not present

## 2021-02-15 DIAGNOSIS — M47816 Spondylosis without myelopathy or radiculopathy, lumbar region: Secondary | ICD-10-CM | POA: Diagnosis not present

## 2021-02-15 DIAGNOSIS — N179 Acute kidney failure, unspecified: Secondary | ICD-10-CM | POA: Diagnosis not present

## 2021-02-15 DIAGNOSIS — M419 Scoliosis, unspecified: Secondary | ICD-10-CM | POA: Diagnosis not present

## 2021-02-15 DIAGNOSIS — I110 Hypertensive heart disease with heart failure: Secondary | ICD-10-CM | POA: Diagnosis not present

## 2021-02-15 DIAGNOSIS — I2510 Atherosclerotic heart disease of native coronary artery without angina pectoris: Secondary | ICD-10-CM | POA: Diagnosis not present

## 2021-02-15 MED ORDER — MODAFINIL 100 MG PO TABS: 100.0000 mg | ORAL_TABLET | Freq: Every day | ORAL | 0 refills | Status: DC

## 2021-02-15 MED ORDER — SPIRONOLACTONE 25 MG PO TABS: 12.5000 mg | ORAL_TABLET | Freq: Every day | ORAL | 1 refills | Status: DC

## 2021-02-15 NOTE — Telephone Encounter (Signed)
Stanton Kidney is calling and is requesting a refill for modafinil (PROVIGIL) 100 MG tablet and spironolactone (ALDACTONE) 25 MG tablet. Also caller stated that she needs clarification for hydrALAZINE (APRESOLINE) 25 MG tablet, please advise. CB is [phone]

## 2021-02-15 NOTE — Chronic Care Management (AMB) (Signed)
  Chronic Care Management   Outreach Note  02/15/2021 Name: Jayde Daffin MRN: [id number] DOB: [birth year]  Son Barkan is a 71 y.o. year old male who is a primary care patient of [name], MD. I reached out to [name] by phone today in response to a referral sent by Mr. Djon Shiroma's PCP, Dr. Sarajane Jews.     A second unsuccessful telephone outreach was attempted today. The patient was referred to the case management team for assistance with care management and care coordination.   Follow Up Plan: A HIPAA compliant phone message was left for the patient providing contact information and requesting a return call. The care management team will reach out to the patient again over the next 7 days. If patient returns call to provider office, please advise to call [place] at [phone].  [place] Management

## 2021-02-15 NOTE — Telephone Encounter (Signed)
Pt refills for Spironolactone and modafini was sent to pr pharmacy. Mary with [facility] state that pt is not sleeping well though the night reason being pt is getting up at night to take his Hydralazine which states to take every 8 hrs daily, Stanton Kidney state that pt is taking the medication BID, request clarification on if pt should continue taking medication twice daily. Please advise

## 2021-02-18 DIAGNOSIS — I5021 Acute systolic (congestive) heart failure: Secondary | ICD-10-CM | POA: Diagnosis not present

## 2021-02-18 DIAGNOSIS — J9601 Acute respiratory failure with hypoxia: Secondary | ICD-10-CM | POA: Diagnosis not present

## 2021-02-18 DIAGNOSIS — N179 Acute kidney failure, unspecified: Secondary | ICD-10-CM | POA: Diagnosis not present

## 2021-02-18 DIAGNOSIS — M47816 Spondylosis without myelopathy or radiculopathy, lumbar region: Secondary | ICD-10-CM | POA: Diagnosis not present

## 2021-02-18 DIAGNOSIS — I2119 ST elevation (STEMI) myocardial infarction involving other coronary artery of inferior wall: Secondary | ICD-10-CM | POA: Diagnosis not present

## 2021-02-18 DIAGNOSIS — Z48812 Encounter for surgical aftercare following surgery on the circulatory system: Secondary | ICD-10-CM | POA: Diagnosis not present

## 2021-02-18 DIAGNOSIS — M419 Scoliosis, unspecified: Secondary | ICD-10-CM | POA: Diagnosis not present

## 2021-02-18 DIAGNOSIS — I2510 Atherosclerotic heart disease of native coronary artery without angina pectoris: Secondary | ICD-10-CM | POA: Diagnosis not present

## 2021-02-18 DIAGNOSIS — I110 Hypertensive heart disease with heart failure: Secondary | ICD-10-CM | POA: Diagnosis not present

## 2021-02-19 DIAGNOSIS — N179 Acute kidney failure, unspecified: Secondary | ICD-10-CM | POA: Diagnosis not present

## 2021-02-19 DIAGNOSIS — I110 Hypertensive heart disease with heart failure: Secondary | ICD-10-CM | POA: Diagnosis not present

## 2021-02-19 DIAGNOSIS — J9601 Acute respiratory failure with hypoxia: Secondary | ICD-10-CM | POA: Diagnosis not present

## 2021-02-19 DIAGNOSIS — I2510 Atherosclerotic heart disease of native coronary artery without angina pectoris: Secondary | ICD-10-CM | POA: Diagnosis not present

## 2021-02-19 DIAGNOSIS — I2119 ST elevation (STEMI) myocardial infarction involving other coronary artery of inferior wall: Secondary | ICD-10-CM | POA: Diagnosis not present

## 2021-02-19 DIAGNOSIS — M419 Scoliosis, unspecified: Secondary | ICD-10-CM | POA: Diagnosis not present

## 2021-02-19 DIAGNOSIS — I5021 Acute systolic (congestive) heart failure: Secondary | ICD-10-CM | POA: Diagnosis not present

## 2021-02-19 DIAGNOSIS — M47816 Spondylosis without myelopathy or radiculopathy, lumbar region: Secondary | ICD-10-CM | POA: Diagnosis not present

## 2021-02-19 DIAGNOSIS — Z48812 Encounter for surgical aftercare following surgery on the circulatory system: Secondary | ICD-10-CM | POA: Diagnosis not present

## 2021-02-19 MED ORDER — MODAFINIL 100 MG PO TABS: 100.0000 mg | ORAL_TABLET | Freq: Every day | ORAL | 5 refills | Status: DC

## 2021-02-19 MED ORDER — SPIRONOLACTONE 25 MG PO TABS: 12.5000 mg | ORAL_TABLET | Freq: Every day | ORAL | 5 refills | Status: DC

## 2021-02-19 NOTE — Telephone Encounter (Signed)
I sent in refills for Modafanil and Spironolactone. Tell him to take the Hydralazine only once a day in the mornings

## 2021-02-20 DIAGNOSIS — I2119 ST elevation (STEMI) myocardial infarction involving other coronary artery of inferior wall: Secondary | ICD-10-CM | POA: Diagnosis not present

## 2021-02-20 DIAGNOSIS — M47816 Spondylosis without myelopathy or radiculopathy, lumbar region: Secondary | ICD-10-CM | POA: Diagnosis not present

## 2021-02-20 DIAGNOSIS — I5021 Acute systolic (congestive) heart failure: Secondary | ICD-10-CM | POA: Diagnosis not present

## 2021-02-20 DIAGNOSIS — M419 Scoliosis, unspecified: Secondary | ICD-10-CM | POA: Diagnosis not present

## 2021-02-20 DIAGNOSIS — Z48812 Encounter for surgical aftercare following surgery on the circulatory system: Secondary | ICD-10-CM | POA: Diagnosis not present

## 2021-02-20 DIAGNOSIS — I110 Hypertensive heart disease with heart failure: Secondary | ICD-10-CM | POA: Diagnosis not present

## 2021-02-20 DIAGNOSIS — N179 Acute kidney failure, unspecified: Secondary | ICD-10-CM | POA: Diagnosis not present

## 2021-02-20 DIAGNOSIS — I2510 Atherosclerotic heart disease of native coronary artery without angina pectoris: Secondary | ICD-10-CM | POA: Diagnosis not present

## 2021-02-20 DIAGNOSIS — J9601 Acute respiratory failure with hypoxia: Secondary | ICD-10-CM | POA: Diagnosis not present

## 2021-02-21 ENCOUNTER — Telehealth: Payer: Self-pay | Admitting: *Deleted

## 2021-02-21 DIAGNOSIS — I2510 Atherosclerotic heart disease of native coronary artery without angina pectoris: Secondary | ICD-10-CM | POA: Diagnosis not present

## 2021-02-21 DIAGNOSIS — Z9181 History of falling: Secondary | ICD-10-CM

## 2021-02-21 DIAGNOSIS — N529 Male erectile dysfunction, unspecified: Secondary | ICD-10-CM

## 2021-02-21 DIAGNOSIS — M170 Bilateral primary osteoarthritis of knee: Secondary | ICD-10-CM

## 2021-02-21 DIAGNOSIS — I5021 Acute systolic (congestive) heart failure: Secondary | ICD-10-CM | POA: Diagnosis not present

## 2021-02-21 DIAGNOSIS — K219 Gastro-esophageal reflux disease without esophagitis: Secondary | ICD-10-CM

## 2021-02-21 DIAGNOSIS — Z955 Presence of coronary angioplasty implant and graft: Secondary | ICD-10-CM

## 2021-02-21 DIAGNOSIS — M47816 Spondylosis without myelopathy or radiculopathy, lumbar region: Secondary | ICD-10-CM | POA: Diagnosis not present

## 2021-02-21 DIAGNOSIS — F419 Anxiety disorder, unspecified: Secondary | ICD-10-CM

## 2021-02-21 DIAGNOSIS — Z48812 Encounter for surgical aftercare following surgery on the circulatory system: Secondary | ICD-10-CM | POA: Diagnosis not present

## 2021-02-21 DIAGNOSIS — J9811 Atelectasis: Secondary | ICD-10-CM

## 2021-02-21 DIAGNOSIS — I110 Hypertensive heart disease with heart failure: Secondary | ICD-10-CM | POA: Diagnosis not present

## 2021-02-21 DIAGNOSIS — F32A Depression, unspecified: Secondary | ICD-10-CM

## 2021-02-21 DIAGNOSIS — I2119 ST elevation (STEMI) myocardial infarction involving other coronary artery of inferior wall: Secondary | ICD-10-CM | POA: Diagnosis not present

## 2021-02-21 DIAGNOSIS — E785 Hyperlipidemia, unspecified: Secondary | ICD-10-CM

## 2021-02-21 DIAGNOSIS — N281 Cyst of kidney, acquired: Secondary | ICD-10-CM

## 2021-02-21 DIAGNOSIS — J9601 Acute respiratory failure with hypoxia: Secondary | ICD-10-CM | POA: Diagnosis not present

## 2021-02-21 DIAGNOSIS — M419 Scoliosis, unspecified: Secondary | ICD-10-CM | POA: Diagnosis not present

## 2021-02-21 DIAGNOSIS — N179 Acute kidney failure, unspecified: Secondary | ICD-10-CM | POA: Diagnosis not present

## 2021-02-21 DIAGNOSIS — E876 Hypokalemia: Secondary | ICD-10-CM

## 2021-02-21 DIAGNOSIS — Z85528 Personal history of other malignant neoplasm of kidney: Secondary | ICD-10-CM

## 2021-02-21 DIAGNOSIS — Z7902 Long term (current) use of antithrombotics/antiplatelets: Secondary | ICD-10-CM

## 2021-02-21 DIAGNOSIS — I700 Atherosclerosis of aorta: Secondary | ICD-10-CM

## 2021-02-21 NOTE — Telephone Encounter (Signed)
Pt has been notified.

## 2021-02-21 NOTE — Telephone Encounter (Signed)
Spoke with pt aware that his Rx for Modafanil was submitted to his plan for coverage, pt is aware to pick Spironolactone from his pharmacy, pt was also advised that Dr Sarajane Jews recommends him to take Hydralazine one daily in the morning. Pt had a lot of questions that I helped answer. Advised that he will get a call form the office  when his approval for [name] is received from his insurance. Pt verbalized understanding

## 2021-02-21 NOTE — Telephone Encounter (Signed)
Noted  

## 2021-02-21 NOTE — Telephone Encounter (Signed)
Prior Auth started for [name] 100 mg Key: [id number]

## 2021-02-22 ENCOUNTER — Telehealth: Payer: Self-pay | Admitting: Family Medicine

## 2021-02-22 ENCOUNTER — Other Ambulatory Visit: Payer: Self-pay

## 2021-02-22 DIAGNOSIS — I110 Hypertensive heart disease with heart failure: Secondary | ICD-10-CM | POA: Diagnosis not present

## 2021-02-22 DIAGNOSIS — M47816 Spondylosis without myelopathy or radiculopathy, lumbar region: Secondary | ICD-10-CM | POA: Diagnosis not present

## 2021-02-22 DIAGNOSIS — M419 Scoliosis, unspecified: Secondary | ICD-10-CM | POA: Diagnosis not present

## 2021-02-22 DIAGNOSIS — R042 Hemoptysis: Secondary | ICD-10-CM | POA: Diagnosis not present

## 2021-02-22 DIAGNOSIS — N179 Acute kidney failure, unspecified: Secondary | ICD-10-CM | POA: Diagnosis not present

## 2021-02-22 DIAGNOSIS — J9601 Acute respiratory failure with hypoxia: Secondary | ICD-10-CM | POA: Diagnosis not present

## 2021-02-22 DIAGNOSIS — I2119 ST elevation (STEMI) myocardial infarction involving other coronary artery of inferior wall: Secondary | ICD-10-CM | POA: Diagnosis not present

## 2021-02-22 DIAGNOSIS — R570 Cardiogenic shock: Secondary | ICD-10-CM | POA: Diagnosis not present

## 2021-02-22 DIAGNOSIS — Z48812 Encounter for surgical aftercare following surgery on the circulatory system: Secondary | ICD-10-CM | POA: Diagnosis not present

## 2021-02-22 DIAGNOSIS — I2510 Atherosclerotic heart disease of native coronary artery without angina pectoris: Secondary | ICD-10-CM | POA: Diagnosis not present

## 2021-02-22 DIAGNOSIS — I5021 Acute systolic (congestive) heart failure: Secondary | ICD-10-CM | POA: Diagnosis not present

## 2021-02-22 MED ORDER — ISOSORBIDE MONONITRATE ER 30 MG PO TB24: 30.0000 mg | ORAL_TABLET | Freq: Every day | ORAL | 1 refills | Status: DC

## 2021-02-22 NOTE — Telephone Encounter (Signed)
Stanton Kidney w/[facility] is calling to see if she can get there frequency of the Rx hydralazine (APRESOLINE) 25 MG.  May leave a detail msg on her secured voice mail.

## 2021-02-22 NOTE — Chronic Care Management (AMB) (Signed)
  Chronic Care Management   Note  02/22/2021 Name: [name] MRN: [id number] DOB: 06/16/1951  [name] is a 70 y.o. year old male who is a primary care patient of [name], MD. I reached out to [name] by phone today in response to a referral sent by [name]'s PCP, Dr. Sarajane Jews.     [name] was given information about Chronic Care Management services today including:  1. CCM service includes personalized support from designated clinical staff supervised by his physician, including individualized plan of care and coordination with other care providers 2. 24/7 contact phone numbers for assistance for urgent and routine care needs. 3. Service will only be billed when office clinical staff spend 20 minutes or more in a month to coordinate care. 4. Only one practitioner may furnish and bill the service in a calendar month. 5. The patient may stop CCM services at any time (effective at the end of the month) by phone call to the office staff. 6. The patient will be responsible for cost sharing (co-pay) of up to 20% of the service fee (after annual deductible is met).  Patient agreed to services and verbal consent obtained.   Follow up plan: Telephone appointment with care management team member scheduled for:02/26/2021  [place] Management

## 2021-02-22 NOTE — Telephone Encounter (Signed)
Spoke with Stanton Kidney from [facility] home health.   Requested information given from 02/15/21, telephone message.   At this time patient hasn't picked up prescriptions from 5/19-5/20/22  refills that was sent to [facility], when patient was discharged.

## 2021-02-25 DIAGNOSIS — I5021 Acute systolic (congestive) heart failure: Secondary | ICD-10-CM | POA: Diagnosis not present

## 2021-02-25 DIAGNOSIS — I110 Hypertensive heart disease with heart failure: Secondary | ICD-10-CM | POA: Diagnosis not present

## 2021-02-25 DIAGNOSIS — I2510 Atherosclerotic heart disease of native coronary artery without angina pectoris: Secondary | ICD-10-CM | POA: Diagnosis not present

## 2021-02-25 DIAGNOSIS — Z48812 Encounter for surgical aftercare following surgery on the circulatory system: Secondary | ICD-10-CM | POA: Diagnosis not present

## 2021-02-25 DIAGNOSIS — M47816 Spondylosis without myelopathy or radiculopathy, lumbar region: Secondary | ICD-10-CM | POA: Diagnosis not present

## 2021-02-25 DIAGNOSIS — M419 Scoliosis, unspecified: Secondary | ICD-10-CM | POA: Diagnosis not present

## 2021-02-25 DIAGNOSIS — J9601 Acute respiratory failure with hypoxia: Secondary | ICD-10-CM | POA: Diagnosis not present

## 2021-02-25 DIAGNOSIS — I2119 ST elevation (STEMI) myocardial infarction involving other coronary artery of inferior wall: Secondary | ICD-10-CM | POA: Diagnosis not present

## 2021-02-25 DIAGNOSIS — N179 Acute kidney failure, unspecified: Secondary | ICD-10-CM | POA: Diagnosis not present

## 2021-02-26 ENCOUNTER — Ambulatory Visit (INDEPENDENT_AMBULATORY_CARE_PROVIDER_SITE_OTHER): Payer: Medicare PPO

## 2021-02-26 DIAGNOSIS — E782 Mixed hyperlipidemia: Secondary | ICD-10-CM | POA: Diagnosis not present

## 2021-02-26 DIAGNOSIS — I2510 Atherosclerotic heart disease of native coronary artery without angina pectoris: Secondary | ICD-10-CM | POA: Diagnosis not present

## 2021-02-26 DIAGNOSIS — I10 Essential (primary) hypertension: Secondary | ICD-10-CM | POA: Diagnosis not present

## 2021-02-26 DIAGNOSIS — R570 Cardiogenic shock: Secondary | ICD-10-CM

## 2021-02-26 NOTE — Chronic Care Management (AMB) (Signed)
Chronic Care Management   CCM RN Visit Note  02/26/2021 Name: [name] MRN: [id number] DOB: [birth year]  Subjective: [name] is a 70 y.o. year old male who is a primary care patient of [name], MD. The care management team was consulted for assistance with disease management and care coordination needs.    Engaged with patient by telephone for initial visit in response to provider referral for case management and/or care coordination services.   Consent to Services:  The patient was given the following information about Chronic Care Management services today, agreed to services, and gave verbal consent: 1. CCM service includes personalized support from designated clinical staff supervised by the primary care provider, including individualized plan of care and coordination with other care providers 2. 24/7 contact phone numbers for assistance for urgent and routine care needs. 3. Service will only be billed when office clinical staff spend 20 minutes or more in a month to coordinate care. 4. Only one practitioner may furnish and bill the service in a calendar month. 5.The patient may stop CCM services at any time (effective at the end of the month) by phone call to the office staff. 6. The patient will be responsible for cost sharing (co-pay) of up to 20% of the service fee (after annual deductible is met). Patient agreed to services and consent obtained.  Patient agreed to services and verbal consent obtained.   Assessment: Review of patient past medical history, allergies, medications, health status, including review of consultants reports, laboratory and other test data, was performed as part of comprehensive evaluation and provision of chronic care management services.   SDOH (Social Determinants of Health) assessments and interventions performed:  SDOH Interventions   Flowsheet Row Most Recent Value  SDOH Interventions   Food Insecurity Interventions Intervention Not  Indicated  Housing Interventions Intervention Not Indicated  Transportation Interventions Intervention Not Indicated  [friends driving to appts has number for [facility] transportation is needed]       CCM Care Plan  Allergies  Allergen Reactions  . Codeine Other (See Comments)    hallucinations  . Lisinopril Cough    Outpatient Encounter Medications as of 02/26/2021  Medication Sig  . aspirin 81 MG chewable tablet Chew 1 tablet (81 mg total) by mouth daily.  . carvedilol (COREG) 3.125 MG tablet Take 1 tablet (3.125 mg total) by mouth 2 (two) times daily with a meal.  . furosemide (LASIX) 40 MG tablet Take 1 tablet (40 mg total) by mouth daily as needed for fluid or edema (weight > 180 lb).  . hydrALAZINE (APRESOLINE) 25 MG tablet Take 1 tablet (25 mg total) by mouth every 8 (eight) hours.  . isosorbide mononitrate (IMDUR) 30 MG 24 hr tablet Take 1 tablet (30 mg total) by mouth daily.  Marland Kitchen LORazepam (ATIVAN) 1 MG tablet Take 1 tablet (1 mg total) by mouth every 6 (six) hours as needed for anxiety.  Marland Kitchen losartan (COZAAR) 25 MG tablet Take 1 tablet (25 mg total) by mouth daily.  . modafinil (PROVIGIL) 100 MG tablet Take 1 tablet (100 mg total) by mouth daily.  . nitroGLYCERIN (NITROSTAT) 0.4 MG SL tablet Place 1 tablet (0.4 mg total) under the tongue every 5 (five) minutes as needed for chest pain.  . potassium chloride SA (KLOR-CON) 20 MEQ tablet Take 1 tablet (20 mEq total) by mouth daily as needed. - Take only when taking Lasix  . rosuvastatin (CRESTOR) 10 MG tablet Take 1 tablet (10 mg total) by mouth daily.  Marland Kitchen  spironolactone (ALDACTONE) 25 MG tablet Take 0.5 tablets (12.5 mg total) by mouth daily.  . ticagrelor (BRILINTA) 90 MG TABS tablet Place 1 tablet (90 mg total) into feeding tube 2 (two) times daily.  . [DISCONTINUED] tadalafil (CIALIS) 20 MG tablet Take 20 mg by mouth daily as needed.     No facility-administered encounter medications on file as of 02/26/2021.    Patient Active Problem  List   Diagnosis Date Noted  . Hypertension   . Hemoptysis   . Acute hypoxemic respiratory failure ([place])   . Acute renal failure ([place])   . Encephalopathy acute   . Cardiogenic shock ([place]) 01/03/2021  . Atypical chest pain 10/25/2019  . Hyperglycemia 10/12/2019  . Coronary artery calcification seen on CAT scan 06/15/2019  . Nodule of lower lobe of left lung 06/15/2019  . Tremor of right hand 04/24/2017  . Numbness and tingling of both legs 04/24/2017  . Bilateral leg edema 06/05/2015  . Chronic neck pain 06/05/2015  . Plantar fasciitis 02/01/2014  . Visual floaters 04/04/2013  . Subjective vision disturbance, left 04/04/2013  . 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 MELANOMA, HX OF 06/01/2009  . NECK PAIN 09/11/2008  . CELLULITIS AND ABSCESS OF LEG EXCEPT FOOT 04/26/2008  . ERECTILE DYSFUNCTION 10/12/2007  . Hyperlipemia, mixed 04/12/2007  . Anxiety state 04/12/2007  . Essential hypertension 04/12/2007  . GERD 04/12/2007    Conditions to be addressed/monitored:CHF, CAD, HTN and HLD  Care Plan : RNCM:Cardiovascular Disease Management(HF, CAD, HTN, HLD)  Updates made by Dimitri Ped, RN since 02/26/2021 12:00 AM    Problem: Lack of long term management of Cardiovascular Disease Management(HF, CAD, HTN, HLD)   Priority: High    Long-Range Goal: Effective self managment of Cardiovascular Disease(HF, CAD, HTN, HLD)   Start Date: 02/26/2021  Expected End Date: 08/22/2021  This Visit's Progress: On track  Priority: High  Note:   Current Barriers:  Marland Kitchen Knowledge deficits related to basic heart failure pathophysiology and self care management, hx of CAD,HTN,HLD . Unable to independently self management of Cardiovascular Disease (HF, CAD, HTN, HLD) . Unable to perform IADLs independently . Pt hospitalized from 01/03/21-02/07/21 with cardiogenic shock and STEMI. Pt lives alone but has a very supportive network of friends and neighbors who have been assisting him with meals,  shopping and transportation as he is not allowed to drive yet.  States he is getting PT/OT and he has been getting stronger.  States he does his exercises daily.  States he does have some shortness of breath when he is doing his exercise.  Denies any chest pains States he is weighing, checking B/P and O2 sat daily.  States his weight has been stable at 176.  B/P was 132/78 and pulse 68 today.  States he is keeping a log of his readings.  States he has been following a low sodium diet and trying to eat more vegetables and fruits. Nurse Case Manager Clinical Goal(s):   patient will weigh self daily and record  patient will verbalize understanding of Heart Failure Action Plan and when to call doctor  patient will take all Heart Failure mediations as prescribed Interventions:  . Collaboration with [name], MD regarding development and update of comprehensive plan of care as evidenced by provider attestation and co-signature . Inter-disciplinary care team collaboration (see longitudinal plan of care) . Basic overview and discussion of pathophysiology of Heart Failure, HTN,HLD . Provided written and verbal education on low sodium diet . Reviewed Heart  Failure Action Plan in depth and provided written copy . Assessed for scales in home . Discussed importance of daily weight . Reviewed role of diuretics in prevention of fluid overload . Reviewed how to use NTG tablets as needed and when to call 911 . Referred to CCM pharmacist to review medications and is interested in changing pharmacies as his current pharmacy has a very confusing refill process . Mailed [place]  calendar with action plans for HF and HTN Self-Care Activities:  Takes Heart Failure Medications as prescribed Weighs daily and record (notifying MD of 3 lb weight gain over night or 5 lb in a week) Verbalizes understanding of and follows CHF Action Plan Adheres to low sodium diet  Patient Goals:  - Take Heart  Failure Medications as prescribed - Weigh daily and record (notify MD with 3 lb weight gain over night or 5 lb in a week) - Follow CHF Action Plan - Adhere to low sodium diet - begin a heart failure diary - bring diary to all appointments - eat more whole grains, fruits and vegetables, lean meats and healthy fats - follow rescue plan if symptoms flare-up - know when to call the doctor - track symptoms and what helps feel better or worse - dress right for the weather, hot or cold - follow activity or exercise plan - check blood pressure daily - choose a place to take my blood pressure (home, clinic or office, retail store) - write blood pressure results in a log or diary   -Take B/P medications as ordered -Plan to follow a low salt diet  -Increase activity as tolerated  Follow Up Plan: Telephone follow up appointment with care management team member scheduled for: 03/29/21 at 10 AM The patient has been provided with contact information for the care management team and has been advised to call with any health related questions or concerns.       Plan:Telephone follow up appointment with care management team member scheduled for:  03/29/21 and The patient has been provided with contact information for the care management team and has been advised to call with any health related questions or concerns.  Peter Garter RN, Jackquline [place], CDE Care Management Coordinator [facility] [phone], Mobile [phone]

## 2021-02-26 NOTE — Telephone Encounter (Signed)
Spoke with pt regarding his denial of his Provigil, pt state that he will discuss with Dr Sarajane Jews if he really needs to continue taking this medication or not  at his visit on 03/12/2021

## 2021-02-26 NOTE — Patient Instructions (Signed)
Visit Information   PATIENT GOALS:  Goals Addressed            This Visit's Progress   . Track and Manage My Blood Pressure-Hypertension   On track    Timeframe:  Long-Range Goal Priority:  Medium Start Date:  02/26/21                           Expected End Date:      08/22/21                 Follow Up Date 03/29/21    - check blood pressure daily - choose a place to take my blood pressure (home, clinic or office, retail store) - write blood pressure results in a log or diary   -Take B/P medications as ordered -Plan to follow a low salt diet  -Increase activity as tolerated Why is this important?    You won't feel high blood pressure, but it can still hurt your blood vessels.   High blood pressure can cause heart or kidney problems. It can also cause a stroke.   Making lifestyle changes like losing a little weight or eating less salt will help.   Checking your blood pressure at home and at different times of the day can help to control blood pressure.   If the doctor prescribes medicine remember to take it the way the doctor ordered.   Call the office if you cannot afford the medicine or if there are questions about it.     Notes:     . Track and Manage Symptoms-Heart Failure   On track    Timeframe:  Long-Range Goal Priority:  High Start Date:       02/26/21                      Expected End Date:       08/22/21                Follow Up Date 03/29/21    - begin a heart failure diary - develop a rescue plan - eat more whole grains, fruits and vegetables, lean meats and healthy fats - follow rescue plan if symptoms flare-up - know when to call the doctor - track symptoms and what helps feel better or worse - dress right for the weather, hot or cold    Why is this important?    You will be able to handle your symptoms better if you keep track of them.   Making some simple changes to your lifestyle will help.   Eating healthy is one thing you can do to take good care  of yourself.    Notes:       Heart Failure, Self-Care Heart failure is a serious condition. The following information explains things you need to do to take care of yourself at home. To help you stay as healthy as possible, you may be asked to change your diet, take certain medicines, and make other changes in your life. Your doctor may also give you more specific instructions. If you have problems or questions, call your doctor. What are the risks? Having heart failure makes it more likely for you to have some problems. These problems can get worse if you do not take good care of yourself. Problems may include:  Damage to the kidneys, liver, or lungs.  Malnutrition.  Abnormal heart rhythms.  Blood clotting problems that could  cause a stroke. Supplies needed:  Scale for weighing yourself.  Blood pressure monitor.  Notebook.  Medicines. How to care for yourself when you have heart failure Medicines Take over-the-counter and prescription medicines only as told by your doctor. Take your medicines every day.  Do not stop taking your medicine unless your doctor tells you to do so.  Do not skip any medicines.  Get your prescriptions refilled before you run out of medicine. This is important.  Talk with your doctor if you cannot afford your medicines. Eating and drinking  Eat heart-healthy foods. Talk with a diet specialist (dietitian) to create an eating plan.  Limit salt (sodium) if told by your doctor. Ask your diet specialist to tell you which seasonings are healthy for your heart.  Cook in healthy ways instead of frying. Healthy ways of cooking include roasting, grilling, broiling, baking, poaching, steaming, and stir-frying.  Choose foods that: ? Have no trans fat. ? Are low in saturated fat and cholesterol.  Choose healthy foods, such as: ? Fresh or frozen fruits and vegetables. ? Fish. ? Low-fat (lean) meats. ? Legumes, such as beans, peas, and  lentils. ? Fat-free or low-fat dairy products. ? Whole-grain foods. ? High-fiber foods.  Limit how much fluid you drink, if told by your doctor.   Alcohol use  Do not drink alcohol if: ? Your doctor tells you not to drink. ? Your heart was damaged by alcohol, or you have very bad heart failure. ? You are pregnant, may be pregnant, or are planning to become pregnant.  If you drink alcohol: ? Limit how much you have to:  0-1 drink a day for women.  0-2 drinks a day for men. ? Know how much alcohol is in your drink. In the [place] one drink equals one 12 oz bottle of beer (355 mL), one 5 oz glass of wine (148 mL), or one 1 oz glass of hard liquor (44 mL). Lifestyle  Do not smoke or use any products that contain nicotine or tobacco. If you need help quitting, ask your doctor. ? Do not use nicotine gum or patches before talking to your doctor.  Do not use illegal drugs.  Lose weight if told by your doctor.  Do physical activity if told by your doctor. Talk to your doctor before you begin an exercise if: ? You are an older adult. ? You have very bad heart failure.  Learn to manage stress. If you need help, ask your doctor.  Get physical rehab (rehabilitation) to help you stay independent and to help with your quality of life.  Participate in a cardiac rehab program. This program helps you improve your health through exercise, education, and counseling.  Plan time to rest when you get tired.   Check weight and blood pressure  Weigh yourself every day. This will help you to know if fluid is building up in your body. ? Weigh yourself every morning after you pee (urinate) and before you eat breakfast. ? Wear the same amount of clothing each time. ? Write down your daily weight. Give your record to your doctor.  Check and write down your blood pressure as told by your doctor.  Check your pulse as told by your doctor.   Dealing with very hot and very cold weather  If it is very  hot: ? Avoid activities that take a lot of energy. ? Use air conditioning or fans, or find a cooler place. ? Avoid caffeine and alcohol. ? Wear  clothing that is loose-fitting, lightweight, and light-colored.  If it is very cold: ? Avoid activities that take a lot of energy. ? Layer your clothes. ? Wear mittens or gloves, a hat, and a face covering when you go outside. ? Avoid alcohol. Follow these instructions at home:  Stay up to date with shots (vaccines). Get pneumococcal and flu (influenza) shots.  Keep all follow-up visits. Contact a doctor if:  You gain 2-3 lb (1-1.4 kg) in 24 hours or 5 lb (2.3 kg) in a week.  You have increasing shortness of breath.  You cannot do your normal activities.  You get tired easily.  You cough a lot.  You do not feel like eating or feel like you may vomit (nauseous).  You have swelling in your hands, feet, ankles, or belly (abdomen).  You cannot sleep well because it is hard to breathe.  You feel like your heart is beating fast (palpitations).  You get dizzy when you stand up.  You feel depressed or sad. Get help right away if:  You have trouble breathing.  You or someone else notices a change in your behavior, such as having trouble staying awake.  You have chest pain or discomfort.  You pass out (faint). These symptoms may be an emergency. Get help right away. Call your local emergency services (911 in the [place]  Do not wait to see if the symptoms will go away.  Do not drive yourself to the hospital. Summary  Heart failure is a serious condition. To care for yourself, you may have to change your diet, take medicines, and make other lifestyle changes.  Take your medicines every day. Do not stop taking them unless your doctor tells you to do so.  Limit salt and eat heart-healthy foods.  Ask your doctor if you can drink alcohol. You may have to stop alcohol use if you have very bad heart failure.  Contact your doctor  if you gain weight quickly or feel that your heart is beating too fast. Get help right away if you pass out or have chest pain or trouble breathing. This information is not intended to replace advice given to you by your health care provider. Make sure you discuss any questions you have with your health care provider. Document Revised: 03/31/2020 Document Reviewed: 03/31/2020 Elsevier Patient Education  2021 [place].   Consent to CCM Services: Mr. Simmer was given information about Chronic Care Management services today including:  1. CCM service includes personalized support from designated clinical staff supervised by his physician, including individualized plan of care and coordination with other care providers 2. 24/7 contact phone numbers for assistance for urgent and routine care needs. 3. Service will only be billed when office clinical staff spend 20 minutes or more in a month to coordinate care. 4. Only one practitioner may furnish and bill the service in a calendar month. 5. The patient may stop CCM services at any time (effective at the end of the month) by phone call to the office staff. 6. The patient will be responsible for cost sharing (co-pay) of up to 20% of the service fee (after annual deductible is met).  Patient agreed to services and verbal consent obtained.   Patient verbalizes understanding of instructions provided today and agrees to view in [place].   Telephone follow up appointment with care management team member scheduled for: 03/29/21 at 25 AM  Peter Garter RN, [facility], CDE Care Management Coordinator [facility] [phone], Mobile [phone]  CLINICAL CARE PLAN: Patient Care Plan: RNCM:Cardiovascular Disease Management(HF, CAD, HTN, HLD)    Problem Identified: Lack of long term management of Cardiovascular Disease Management(HF, CAD, HTN, HLD)   Priority: High    Long-Range Goal: Effective self managment of  Cardiovascular Disease(HF, CAD, HTN, HLD)   Start Date: 02/26/2021  Expected End Date: 08/22/2021  This Visit's Progress: On track  Priority: High  Note:   Current Barriers:  Marland Kitchen Knowledge deficits related to basic heart failure pathophysiology and self care management, hx of CAD,HTN,HLD . Unable to independently self management of Cardiovascular Disease (HF, CAD, HTN, HLD) . Unable to perform IADLs independently . Pt hospitalized from 01/03/21-02/07/21 with cardiogenic shock and STEMI. Pt lives alone but has a very supportive network of friends and neighbors who have been assisting him with meals, shopping and transportation as he is not allowed to drive yet.  States he is getting PT/OT and he has been getting stronger.  States he does his exercises daily.  States he does have some shortness of breath when he is doing his exercise.  Denies any chest pains States he is weighing, checking B/P and O2 sat daily.  States his weight has been stable at 176.  B/P was 132/78 and pulse 68 today.  States he is keeping a log of his readings.  States he has been following a low sodium diet and trying to eat more vegetables and fruits. Nurse Case Manager Clinical Goal(s):   patient will weigh self daily and record  patient will verbalize understanding of Heart Failure Action Plan and when to call doctor  patient will take all Heart Failure mediations as prescribed Interventions:  . Collaboration with [name], MD regarding development and update of comprehensive plan of care as evidenced by provider attestation and co-signature . Inter-disciplinary care team collaboration (see longitudinal plan of care) . Basic overview and discussion of pathophysiology of Heart Failure, HTN,HLD . Provided written and verbal education on low sodium diet . Reviewed Heart Failure Action Plan in depth and provided written copy . Assessed for scales in home . Discussed importance of daily weight . Reviewed role of diuretics  in prevention of fluid overload . Reviewed how to use NTG tablets as needed and when to call 911 . Referred to CCM pharmacist to review medications and is interested in changing pharmacies as his current pharmacy has a very confusing refill process . Mailed [place]  calendar with action plans for HF and HTN Self-Care Activities:  Takes Heart Failure Medications as prescribed Weighs daily and record (notifying MD of 3 lb weight gain over night or 5 lb in a week) Verbalizes understanding of and follows CHF Action Plan Adheres to low sodium diet  Patient Goals:  - Take Heart Failure Medications as prescribed - Weigh daily and record (notify MD with 3 lb weight gain over night or 5 lb in a week) - Follow CHF Action Plan - Adhere to low sodium diet - begin a heart failure diary - bring diary to all appointments - eat more whole grains, fruits and vegetables, lean meats and healthy fats - follow rescue plan if symptoms flare-up - know when to call the doctor - track symptoms and what helps feel better or worse - dress right for the weather, hot or cold - follow activity or exercise plan - check blood pressure daily - choose a place to take my blood pressure (home, clinic or office, retail store) - write blood pressure results in a  log or diary   -Take B/P medications as ordered -Plan to follow a low salt diet  -Increase activity as tolerated  Follow Up Plan: Telephone follow up appointment with care management team member scheduled for: 03/29/21 at 10 AM The patient has been provided with contact information for the care management team and has been advised to call with any health related questions or concerns.

## 2021-02-27 NOTE — Telephone Encounter (Signed)
Pt stated that he will discuss further if he needs to take this medication further with Dr Sarajane Jews at his visit on 03/12/2021

## 2021-02-28 DIAGNOSIS — I2119 ST elevation (STEMI) myocardial infarction involving other coronary artery of inferior wall: Secondary | ICD-10-CM | POA: Diagnosis not present

## 2021-02-28 DIAGNOSIS — J9601 Acute respiratory failure with hypoxia: Secondary | ICD-10-CM | POA: Diagnosis not present

## 2021-02-28 DIAGNOSIS — I2510 Atherosclerotic heart disease of native coronary artery without angina pectoris: Secondary | ICD-10-CM | POA: Diagnosis not present

## 2021-02-28 DIAGNOSIS — N179 Acute kidney failure, unspecified: Secondary | ICD-10-CM | POA: Diagnosis not present

## 2021-02-28 DIAGNOSIS — M419 Scoliosis, unspecified: Secondary | ICD-10-CM | POA: Diagnosis not present

## 2021-02-28 DIAGNOSIS — I110 Hypertensive heart disease with heart failure: Secondary | ICD-10-CM | POA: Diagnosis not present

## 2021-02-28 DIAGNOSIS — M47816 Spondylosis without myelopathy or radiculopathy, lumbar region: Secondary | ICD-10-CM | POA: Diagnosis not present

## 2021-02-28 DIAGNOSIS — I5021 Acute systolic (congestive) heart failure: Secondary | ICD-10-CM | POA: Diagnosis not present

## 2021-02-28 DIAGNOSIS — Z48812 Encounter for surgical aftercare following surgery on the circulatory system: Secondary | ICD-10-CM | POA: Diagnosis not present

## 2021-03-01 DIAGNOSIS — M47816 Spondylosis without myelopathy or radiculopathy, lumbar region: Secondary | ICD-10-CM | POA: Diagnosis not present

## 2021-03-01 DIAGNOSIS — I2119 ST elevation (STEMI) myocardial infarction involving other coronary artery of inferior wall: Secondary | ICD-10-CM | POA: Diagnosis not present

## 2021-03-01 DIAGNOSIS — J9601 Acute respiratory failure with hypoxia: Secondary | ICD-10-CM | POA: Diagnosis not present

## 2021-03-01 DIAGNOSIS — I5021 Acute systolic (congestive) heart failure: Secondary | ICD-10-CM | POA: Diagnosis not present

## 2021-03-01 DIAGNOSIS — M419 Scoliosis, unspecified: Secondary | ICD-10-CM | POA: Diagnosis not present

## 2021-03-01 DIAGNOSIS — I110 Hypertensive heart disease with heart failure: Secondary | ICD-10-CM | POA: Diagnosis not present

## 2021-03-01 DIAGNOSIS — Z48812 Encounter for surgical aftercare following surgery on the circulatory system: Secondary | ICD-10-CM | POA: Diagnosis not present

## 2021-03-01 DIAGNOSIS — N179 Acute kidney failure, unspecified: Secondary | ICD-10-CM | POA: Diagnosis not present

## 2021-03-01 DIAGNOSIS — I2510 Atherosclerotic heart disease of native coronary artery without angina pectoris: Secondary | ICD-10-CM | POA: Diagnosis not present

## 2021-03-04 ENCOUNTER — Telehealth: Payer: Self-pay | Admitting: Family Medicine

## 2021-03-04 ENCOUNTER — Telehealth: Payer: Self-pay | Admitting: Pharmacist

## 2021-03-04 DIAGNOSIS — M419 Scoliosis, unspecified: Secondary | ICD-10-CM | POA: Diagnosis not present

## 2021-03-04 DIAGNOSIS — I110 Hypertensive heart disease with heart failure: Secondary | ICD-10-CM | POA: Diagnosis not present

## 2021-03-04 DIAGNOSIS — I2119 ST elevation (STEMI) myocardial infarction involving other coronary artery of inferior wall: Secondary | ICD-10-CM | POA: Diagnosis not present

## 2021-03-04 DIAGNOSIS — Z48812 Encounter for surgical aftercare following surgery on the circulatory system: Secondary | ICD-10-CM | POA: Diagnosis not present

## 2021-03-04 DIAGNOSIS — N179 Acute kidney failure, unspecified: Secondary | ICD-10-CM | POA: Diagnosis not present

## 2021-03-04 DIAGNOSIS — M47816 Spondylosis without myelopathy or radiculopathy, lumbar region: Secondary | ICD-10-CM | POA: Diagnosis not present

## 2021-03-04 DIAGNOSIS — I5021 Acute systolic (congestive) heart failure: Secondary | ICD-10-CM | POA: Diagnosis not present

## 2021-03-04 DIAGNOSIS — I2510 Atherosclerotic heart disease of native coronary artery without angina pectoris: Secondary | ICD-10-CM | POA: Diagnosis not present

## 2021-03-04 DIAGNOSIS — J9601 Acute respiratory failure with hypoxia: Secondary | ICD-10-CM | POA: Diagnosis not present

## 2021-03-04 NOTE — Chronic Care Management (AMB) (Addendum)
Chronic Care Management Pharmacy Assistant   Name: [name]  MRN: [id number] DOB: 1951/09/15  Reason for Encounter: Chart Prep for CPP Visit on 03/07/2021   Conditions to be addressed/monitored: HTN, HLD, and GERD  Primary concerns for visit include: Medication Management  Recent office visits:  05.26.2022 Dalton Morale, MD (PCP) patient was seen for hospital follow up. Patient restarted Lorazepam 2 MG and nitroglycerin 0.4 MG was prescribed 04.08.2022 Dalton Pigg, LPN Medicare Wellness Exam 01.11.20211 Dalton Morale, MD (PCP) patient seen for follow-up on medication and left should pain.  Recent consult visits:  03.16.2022 Dalton Harp, MD Cardiology initial visit patient referred for cardiovascular evaluation.  Hospital visits:  Medication Reconciliation was completed by comparing discharge summary, patient's EMR and Pharmacy list, and upon discussion with patient.  Admitted to the hospital on 04//14/2022 due to Acute Hypoxemic respiratory failure. Discharge date was 02/07/2021. Discharged from [facility]?Medications Started at [facility] Discharge:?? -started the following mediation Carvedilol (COREG) 3.125 mg one tablet by mouth two times daily meals Aspirin 81 mg: take one table daily Furosemide (Lasix) 40 mg: take one tablet as needed for fluid or edema Hydralazine (APRESOLINE)25 mg: take one tablet every 8 hours Isosorbide mononitrate (IMDUR) 30 mg 24 hr: one tablet by mouth daily [name] (PROVIGIL) 100 mg: one tablet daily Potassium Chloride ER 20 MEQ: take one tablet daily as needed. Only when taking laxis Spironolactone (ALDACTONE) 25 mg: take 0.5 tablets daily Ticagrelor (BRILINTA): 90 tablets: place one tablet into feeding tube twice daily  Medication Changes at Hospital Discharge: -Changed  Losartan (COZAAR) 25 mg: one tablet daily Rosuvastatin (CRESTOR) 10 mg: take one table daily  Medications Discontinued at  Hospital Discharge: -Stopped the following medication Acetaminophen 325 MG tablet (TYLENOL) Amlodipine 5 MG tablet (NORVASC) Cyclobenzaprine 10 MG tablet (FLEXERIL) Duloxetine 20 MG capsule (CYMBALTA) Finasteride 5 MG tablet ([name]) Fluad 0.5 ML Susy Lorazepam 2 MG tablet (ATIVAN) Magic mouthwash solution Melatonin 10 MG Tabs Omeprazole 20 MG capsule (PRILOSEC) Tamsulosin 0.4 MG Caps capsule (FLOMAX) Triamcinolone cream 0.1 % (KENALOG)  Medications that remain the same after Hospital Discharge:??  -All other medications will remain the same.    Medications: Outpatient Encounter Medications as of 03/04/2021  Medication Sig   aspirin 81 MG chewable tablet Chew 1 tablet (81 mg total) by mouth daily.   carvedilol (COREG) 3.125 MG tablet Take 1 tablet (3.125 mg total) by mouth 2 (two) times daily with a meal.   furosemide (LASIX) 40 MG tablet Take 1 tablet (40 mg total) by mouth daily as needed for fluid or edema (weight > 180 lb).   hydrALAZINE (APRESOLINE) 25 MG tablet Take 1 tablet (25 mg total) by mouth every 8 (eight) hours.   isosorbide mononitrate (IMDUR) 30 MG 24 hr tablet Take 1 tablet (30 mg total) by mouth daily.   LORazepam (ATIVAN) 1 MG tablet Take 1 tablet (1 mg total) by mouth every 6 (six) hours as needed for anxiety.   losartan (COZAAR) 25 MG tablet Take 1 tablet (25 mg total) by mouth daily.   modafinil (PROVIGIL) 100 MG tablet Take 1 tablet (100 mg total) by mouth daily.   nitroGLYCERIN (NITROSTAT) 0.4 MG SL tablet Place 1 tablet (0.4 mg total) under the tongue every 5 (five) minutes as needed for chest pain.   potassium chloride SA (KLOR-CON) 20 MEQ tablet Take 1 tablet (20 mEq total) by mouth daily as needed. - Take only when taking Lasix  rosuvastatin (CRESTOR) 10 MG tablet Take 1 tablet (10 mg total) by mouth daily.   spironolactone (ALDACTONE) 25 MG tablet Take 0.5 tablets (12.5 mg total) by mouth daily.   ticagrelor (BRILINTA) 90 MG TABS tablet Place 1 tablet  (90 mg total) into feeding tube 2 (two) times daily.   [DISCONTINUED] tadalafil (CIALIS) 20 MG tablet Take 20 mg by mouth daily as needed.     No facility-administered encounter medications on file as of 03/04/2021.  Patient Questions:   Have you seen any other providers since your last visit?  Cardiology Urology  Any changes in your medications or health?  Please see last hospital visit 04.14.2022 [name] ([name]  tablet) Finasteride 5 MG tablet ([name])-start taking about two weeks ago Lorazepam 2 MG tablet (ATIVAN) started back on 05/26 Ketoconazole 2% PRN Triamcinolone Acetonide 0.1% PRN Any side effects from any medications?  Sleep issues (patient is not taking [name] 100 mg) Do you have an symptoms or problems not managed by your medications? No Any concerns about your health right now? No Has your provider asked that you check blood pressure, blood sugar, or follow special diet at home?  Blood pressure twice day Low sodium weight  Takes weight daily Do you get any type of exercise on a regular basis?  He gets some walking about a week Can you think of a goal you would like to reach for your health?  Maintain health Exercise and strength training Do local driving Social goal- going back church and continue talking with neighbor Do you have any problems getting your medications? No Is there anything that you would like to discuss during the appointment? No  He stated that he may be switching pharmacies. He has two options and he wanted to see which one would be the best for him.  Date- Patient called to remind of appointment with [name] on 03/07/2021 at 3:30 pm.  Patient aware of appointment date, time, and type of appointment (in person). Patient aware to have/bring all medications, supplements, blood pressure and/or blood sugar logs to visit.  Star Rating Drugs: Medication Dispensed  Quantity Pharmacy  Losartan 25 mg  05.19.2022 30    Rosuvastatin 10 mg  05.19.2022 [place] [facility] [place], [place] Pharmacist Assistant [phone]

## 2021-03-04 NOTE — Progress Notes (Signed)
  Chronic Care Management   Note  03/04/2021 Name: [name] MRN: [id number] DOB: 04-19-1951  [name] is a 70 y.o. year old male who is a primary care patient of Dalton Morale, MD. I reached out to [name] by phone today in response to a referral sent by [name]'s PCP, Dalton Morale, MD.   [name] was given information about Chronic Care Management services today including:  CCM service includes personalized support from designated clinical staff supervised by his physician, including individualized plan of care and coordination with other care providers 24/7 contact phone numbers for assistance for urgent and routine care needs. Service will only be billed when office clinical staff spend 20 minutes or more in a month to coordinate care. Only one practitioner may furnish and bill the service in a calendar month. The patient may stop CCM services at any time (effective at the end of the month) by phone call to the office staff.   Patient agreed to services and verbal consent obtained.   Follow up plan:   [name] Upstream Scheduler   Pt aware he has a $20 copay with visit

## 2021-03-04 NOTE — Progress Notes (Signed)
Advanced Heart Failure Clinic Note   PCP: Dr. [name] Penna Primary Cardiologist:  HF Cardiologist: Dr. Haroldine Laws  HPI:  [name] is a 70 year old male with past medical history of hypertension, hyperlipidemia, CAD, and systolic heart failure.  He was admitted 4/22 with STEMI and cardiac arrest prior to coming to hospital.  He underwent emergent cardiac catheterization and PCI with stent to proximal RCA. EF 25 to 30%.  Patient did have prolonged ICU stay for cardiogenic/hemorrhagic shock requiring vasopressors , Impella and transfusion.  He suffered from diminished mental status and MRI showed scattered punctate infarction concerning for anoxic injury.  He had AKI requiring CRRT, then transitioned to [facility].  Palliative care was consulted and made DNR.  He was discharged to home on oxygen with Home Health, discharge weight 175.5 lbs.  Today he returns for post hospital HF follow up. Overall feeling fine. Has HH PT/OT/SLP. Denies increasing SOB, CP, dizziness, edema, or PND/Orthopnea. Appetite ok. No fever or chills. Weight at home 173-175 pounds. Taking all medications, has not needed prn lasix. Has Rx for sildenafil and uses once a week, has not used any since MI.  Cardiac Studies: - Echo (4/22): EF 25-30%, severe LV dysfunction, RV moderately reduced  - LHC (4/22): Ost LAD to Prox LAD lesion is 50% stenosed. Mid LAD lesion is 20% stenosed. Prox RCA-1 lesion is 100% stenosed, s/p PCI+DES Prox RCA-2 lesion is 50% stenosed.  - RHC (4/22): On NE and Impella RA = 4 RV = 19/3 PA = 20/10 (11) PCW = 7 Fick cardiac output/index = 2.6/1.7 FA sat = 100% PA sat = 54%  ROS: All systems negative except as listed in HPI, PMH and Problem List.  SH:  Social History   Socioeconomic History   Marital status: Single    Spouse name: Not on file   Number of children: Not on file   Years of education: Not on file   Highest education level: Not on file  Occupational History    Occupation: retired    Comment: Pharmacist, hospital  Tobacco Use   Smoking status: Never   Smokeless tobacco: Never  Vaping Use   Vaping Use: Never used  Substance and Sexual Activity   Alcohol use: No    Alcohol/week: 0.0 standard drinks   Drug use: No   Sexual activity: Not on file  Other Topics Concern   Not on file  Social History Narrative   Not on file   Social Determinants of Health   Financial Resource Strain: Low Risk    Difficulty of Paying Living Expenses: Not hard at all  Food Insecurity: No Food Insecurity   Worried About Charity fundraiser in the Last Year: Never true   [place] in the Last Year: Never true  Transportation Needs: No Transportation Needs   Lack of Transportation (Medical): No   Lack of Transportation (Non-Medical): No  Physical Activity: Sufficiently Active   Days of Exercise per Week: 7 days   Minutes of Exercise per Session: 30 min  Stress: No Stress Concern Present   Feeling of Stress : Not at all  Social Connections: Socially Isolated   Frequency of Communication with Friends and Family: More than three times a week   Frequency of Social Gatherings with Friends and Family: More than three times a week   Attends Religious Services: Never   Marine scientist or Organizations: No   Attends Archivist Meetings: Never   Marital Status: Never  married  Intimate Partner Violence: Not At Risk   Fear of Current or Ex-Partner: No   Emotionally Abused: No   Physically Abused: No   Sexually Abused: No    FH:  Family History  Problem Relation Age of Onset   Hypertension Mother    Hyperlipidemia Mother    Depression Other    Diabetes Other    Hyperlipidemia Other    Hypertension Other    Alzheimer's disease Other    Colon cancer Neg Hx     Past Medical History:  Diagnosis Date   Anxiety    Arthritis    Cancer ([place]) 2010   basal cell ca, head   Cataract    bilateral   Depression    ED (erectile dysfunction)    GERD  (gastroesophageal reflux disease)    Hyperlipidemia    Hypertension    Kidney stone 07-29-11   passed    Neuromuscular disorder (HCC)    neuropathy legs   Vitreous floater    left eye, sees Dr. Dawna Part at [facility]     Current Outpatient Medications  Medication Sig Dispense Refill   aspirin 81 MG chewable tablet Chew 1 tablet (81 mg total) by mouth daily. 30 tablet 0   carvedilol (COREG) 3.125 MG tablet Take 1 tablet (3.125 mg total) by mouth 2 (two) times daily with a meal. 60 tablet 0   dapagliflozin propanediol (FARXIGA) 10 MG TABS tablet Take 1 tablet (10 mg total) by mouth daily before breakfast. 30 tablet 11   finasteride ([name]) 5 MG tablet Take 5 mg by mouth daily.     hydrALAZINE (APRESOLINE) 25 MG tablet Take 1 tablet (25 mg total) by mouth every 8 (eight) hours. 90 tablet 0   isosorbide mononitrate (IMDUR) 30 MG 24 hr tablet Take 1 tablet (30 mg total) by mouth daily. 30 tablet 1   LORazepam (ATIVAN) 1 MG tablet Take 1 tablet (1 mg total) by mouth every 6 (six) hours as needed for anxiety. 90 tablet 1   losartan (COZAAR) 25 MG tablet Take 1 tablet (25 mg total) by mouth daily. 30 tablet 0   modafinil (PROVIGIL) 100 MG tablet Take 1 tablet (100 mg total) by mouth daily. 30 tablet 5   omeprazole (PRILOSEC) 20 MG capsule Take 20 mg by mouth daily.     rosuvastatin (CRESTOR) 10 MG tablet Take 1 tablet (10 mg total) by mouth daily. 30 tablet 0   spironolactone (ALDACTONE) 25 MG tablet Take 0.5 tablets (12.5 mg total) by mouth daily. 15 tablet 5   ticagrelor (BRILINTA) 90 MG TABS tablet Place 1 tablet (90 mg total) into feeding tube 2 (two) times daily. 60 tablet 0   furosemide (LASIX) 40 MG tablet Take 1 tablet (40 mg total) by mouth daily as needed for fluid or edema (weight > 180 lb). (Patient not taking: Reported on 03/05/2021) 30 tablet 0   nitroGLYCERIN (NITROSTAT) 0.4 MG SL tablet Place 1 tablet (0.4 mg total) under the tongue every 5 (five) minutes as needed for chest  pain. (Patient not taking: Reported on 03/05/2021) 50 tablet 3   potassium chloride SA (KLOR-CON) 20 MEQ tablet Take 1 tablet (20 mEq total) by mouth daily as needed. - Take only when taking Lasix (Patient not taking: Reported on 03/05/2021) 30 tablet 0   No current facility-administered medications for this encounter.    Vitals:   03/05/21 1011  BP: 128/64  Pulse: 71  SpO2: 100%  Weight: 81 kg (178 lb  9.6 oz)   Wt Readings from Last 3 Encounters:  03/05/21 81 kg (178 lb 9.6 oz)  02/14/21 79.8 kg (176 lb)  02/07/21 79.7 kg (175 lb 11.3 oz)   PHYSICAL EXAM: General:  NAD. No resp difficulty. HEENT: Normal Neck: Supple. No JVD. Carotids 2+ bilat; no bruits. No lymphadenopathy or thryomegaly appreciated. Cor: PMI nondisplaced. Regular rate & rhythm. No rubs, gallops or murmurs. Lungs: Clear Abdomen: Soft, nontender, nondistended. No hepatosplenomegaly. No bruits or masses. Good bowel sounds. Extremities: No cyanosis, clubbing, rash, edema Neuro: alert & oriented x 3, cranial nerves grossly intact. Moves all 4 extremities w/o difficulty. Affect pleasant. Slight head tremor.  ECG: SR 73 bpm (personally reviewed).  ASSESSMENT & PLAN:   1. CAD - Emergent cath (4/22) w/ pRCA occlusion s/p PCI + DES. - No CP. - Continue ASA 81 mg daily. - Continue Brilinta 90 mg bid. - Continue Crestor 10 mg daily. - CBC today.   2. History of VF Arrest - In setting of acute MI, now s/p revascularization. - No further VF. Rhythm stable.   3. Systolic Heart Failure  - ICM. Echo (4/22) LVEF 20-25%, RV moderately reduced. - NYHA II, euvolemic on exam, weight stable. - Start [place]. - Continue losartan 25 mg daily. Consider switching to [facility] next visit. - Continue spiro 12.5 mg daily. - Continue hydralazine 25 mg tid. - Continue Imdur 30 mg daily (no sildenafil with Imdur). - Continue carvedilol 3.125 mg bid. - Continue Lasix 40 mg daily + KCL 20 mg daily for weight > 180 lbs only (PRN  only) - BMET today.   4. CKD IIIb - Due to previous ATN/shock requiring CVVHD & iHD. - Baseline SCr ~1.75 - BMET today.   5. Anoxic brain injury - Much improved. - HH PT/OT/SLP. - No driving, per PCP.  Follow up in 3-4 weeks with APP for further medication titration.  Will consider timing of repeat Echo when medications optimized.  Allena Katz, FNP-BC 03/05/21

## 2021-03-05 ENCOUNTER — Encounter (HOSPITAL_COMMUNITY): Payer: Self-pay

## 2021-03-05 ENCOUNTER — Other Ambulatory Visit (HOSPITAL_COMMUNITY): Payer: Self-pay

## 2021-03-05 ENCOUNTER — Other Ambulatory Visit: Payer: Self-pay

## 2021-03-05 ENCOUNTER — Ambulatory Visit (HOSPITAL_COMMUNITY)
Admission: RE | Admit: 2021-03-05 | Discharge: 2021-03-05 | Disposition: A | Discharge status: Home / Self Care | Payer: Medicare PPO | Source: Ambulatory Visit | Attending: Family Medicine | Admitting: Family Medicine

## 2021-03-05 VITALS — BP 128/64 | HR 71 | Wt 178.6 lb

## 2021-03-05 DIAGNOSIS — Z8249 Family history of ischemic heart disease and other diseases of the circulatory system: Secondary | ICD-10-CM | POA: Insufficient documentation

## 2021-03-05 DIAGNOSIS — I5020 Unspecified systolic (congestive) heart failure: Secondary | ICD-10-CM | POA: Insufficient documentation

## 2021-03-05 DIAGNOSIS — Z79899 Other long term (current) drug therapy: Secondary | ICD-10-CM | POA: Insufficient documentation

## 2021-03-05 DIAGNOSIS — E785 Hyperlipidemia, unspecified: Secondary | ICD-10-CM | POA: Insufficient documentation

## 2021-03-05 DIAGNOSIS — I2583 Coronary atherosclerosis due to lipid rich plaque: Secondary | ICD-10-CM | POA: Diagnosis not present

## 2021-03-05 DIAGNOSIS — Z8674 Personal history of sudden cardiac arrest: Secondary | ICD-10-CM | POA: Insufficient documentation

## 2021-03-05 DIAGNOSIS — I5022 Chronic systolic (congestive) heart failure: Secondary | ICD-10-CM

## 2021-03-05 DIAGNOSIS — I252 Old myocardial infarction: Secondary | ICD-10-CM | POA: Insufficient documentation

## 2021-03-05 DIAGNOSIS — Z8679 Personal history of other diseases of the circulatory system: Secondary | ICD-10-CM

## 2021-03-05 DIAGNOSIS — I2510 Atherosclerotic heart disease of native coronary artery without angina pectoris: Secondary | ICD-10-CM | POA: Insufficient documentation

## 2021-03-05 DIAGNOSIS — Z7982 Long term (current) use of aspirin: Secondary | ICD-10-CM | POA: Diagnosis not present

## 2021-03-05 DIAGNOSIS — I130 Hypertensive heart and chronic kidney disease with heart failure and stage 1 through stage 4 chronic kidney disease, or unspecified chronic kidney disease: Secondary | ICD-10-CM | POA: Diagnosis not present

## 2021-03-05 DIAGNOSIS — G931 Anoxic brain damage, not elsewhere classified: Secondary | ICD-10-CM

## 2021-03-05 DIAGNOSIS — N1832 Chronic kidney disease, stage 3b: Secondary | ICD-10-CM | POA: Diagnosis not present

## 2021-03-05 DIAGNOSIS — Z87820 Personal history of traumatic brain injury: Secondary | ICD-10-CM | POA: Diagnosis not present

## 2021-03-05 LAB — CBC
HCT: 31.1 % — ABNORMAL LOW (ref 39.0–52.0)
Hemoglobin: 10.2 g/dL — ABNORMAL LOW (ref 13.0–17.0)
MCH: 32.6 pg (ref 26.0–34.0)
MCHC: 32.8 g/dL (ref 30.0–36.0)
MCV: 99.4 fL (ref 80.0–100.0)
Platelets: 223 10*3/uL (ref 150–400)
RBC: 3.13 MIL/uL — ABNORMAL LOW (ref 4.22–5.81)
RDW: 15.1 % (ref 11.5–15.5)
WBC: 6.2 10*3/uL (ref 4.0–10.5)
nRBC: 0 % (ref 0.0–0.2)

## 2021-03-05 LAB — BASIC METABOLIC PANEL
Anion gap: 9 (ref 5–15)
BUN: 31 mg/dL — ABNORMAL HIGH (ref 8–23)
CO2: 22 mmol/L (ref 22–32)
Calcium: 9.3 mg/dL (ref 8.9–10.3)
Chloride: 104 mmol/L (ref 98–111)
Creatinine, Ser: 1.61 mg/dL — ABNORMAL HIGH (ref 0.61–1.24)
GFR, Estimated: 46 mL/min — ABNORMAL LOW (ref >60)
Glucose, Bld: 99 mg/dL (ref 70–99)
Potassium: 4.3 mmol/L (ref 3.5–5.1)
Sodium: 135 mmol/L (ref 135–145)

## 2021-03-05 MED ORDER — ROSUVASTATIN CALCIUM 10 MG PO TABS: 10.0000 mg | ORAL_TABLET | Freq: Every day | ORAL | 0 refills | Status: DC

## 2021-03-05 MED ORDER — DAPAGLIFLOZIN PROPANEDIOL 10 MG PO TABS: 10.0000 mg | ORAL_TABLET | Freq: Every day | ORAL | 11 refills | Status: DC

## 2021-03-05 MED ORDER — LOSARTAN POTASSIUM 25 MG PO TABS: 25.0000 mg | ORAL_TABLET | Freq: Every day | ORAL | 0 refills | Status: DC

## 2021-03-05 MED ORDER — TICAGRELOR 90 MG PO TABS: 90.0000 mg | ORAL_TABLET | Freq: Two times a day (BID) | ORAL | 0 refills | Status: DC

## 2021-03-05 MED ORDER — HYDRALAZINE HCL 25 MG PO TABS: 25.0000 mg | ORAL_TABLET | Freq: Three times a day (TID) | ORAL | 0 refills | Status: DC

## 2021-03-05 MED ORDER — ASPIRIN 81 MG PO CHEW: 81.0000 mg | CHEWABLE_TABLET | Freq: Every day | ORAL | 0 refills | Status: DC

## 2021-03-05 MED ORDER — CARVEDILOL 3.125 MG PO TABS: 3.1250 mg | ORAL_TABLET | Freq: Two times a day (BID) | ORAL | 0 refills | Status: DC

## 2021-03-05 NOTE — Patient Instructions (Signed)
START Farxiga 10 mg, one tab daily  Be sure to HOLD IMDUR on the days you plan to use SILDENAFIL   Labs today We will only contact you if something comes back abnormal or we need to make some changes. Otherwise no news is good news!  Labs needed in 7-10 days  Your physician recommends that you schedule a follow-up appointment in: 4-6 weeks  in the Advanced Practitioners (PA/NP) Clinic   Do the following things EVERYDAY: Weigh yourself in the morning before breakfast. Write it down and keep it in a log. Take your medicines as prescribed Eat low salt foods--Limit salt (sodium) to 2000 mg per day.  Stay as active as you can everyday Limit all fluids for the day to less than 2 liters  At the [facility], you and your health needs are our priority. As part of our continuing mission to provide you with exceptional heart care, we have created designated Provider Care Teams. These Care Teams include your primary Cardiologist (physician) and Advanced Practice Providers (APPs- Physician Assistants and Nurse Practitioners) who all work together to provide you with the care you need, when you need it.   You may see any of the following providers on your designated Care Team at your next follow up: Dr Glori Bickers Dr Loralie Champagne Dr Patrice Paradise, NP Lyda Jester, [place] Ginnie Smart Audry Riles, PharmD   Please be sure to bring in all your medications bottles to every appointment.   If you have any questions or concerns before your next appointment please send [place] a message through [place] or call our office at [phone].    TO LEAVE A MESSAGE FOR THE NURSE SELECT OPTION 2, PLEASE LEAVE A MESSAGE INCLUDING: YOUR NAME DATE OF BIRTH CALL BACK NUMBER REASON FOR CALL**this is important as we prioritize the call backs  YOU WILL RECEIVE A CALL BACK THE SAME DAY AS LONG AS YOU CALL BEFORE 4:00 PM

## 2021-03-07 ENCOUNTER — Other Ambulatory Visit: Payer: Self-pay

## 2021-03-07 ENCOUNTER — Ambulatory Visit: Payer: Medicare PPO | Admitting: Pharmacist

## 2021-03-07 DIAGNOSIS — I10 Essential (primary) hypertension: Secondary | ICD-10-CM

## 2021-03-07 DIAGNOSIS — E782 Mixed hyperlipidemia: Secondary | ICD-10-CM | POA: Diagnosis not present

## 2021-03-07 DIAGNOSIS — I2510 Atherosclerotic heart disease of native coronary artery without angina pectoris: Secondary | ICD-10-CM | POA: Diagnosis not present

## 2021-03-07 NOTE — Progress Notes (Signed)
Chronic Care Management Pharmacy Note  03/19/2021 Name:  [name] MRN:  [id number] DOB:  May 17, 1951  Summary: BP is at goal < 130/80 per home and office BP readings LDL is not at goal < 70  Recommendations/Changes made from today's visit: -Recommended rechecking lipid panel due to medication change since last lab work -Switching to [employer] for adherence packaging  Plan: Follow up in 3 months  Subjective: [name] is an 70 y.o. year old male who is a primary patient of [name], MD.  The CCM team was consulted for assistance with disease management and care coordination needs.    Engaged with patient face to face for initial visit in response to provider referral for pharmacy case management and/or care coordination services.   Consent to Services:  The patient was given the following information about Chronic Care Management services today, agreed to services, and gave verbal consent: 1. CCM service includes personalized support from designated clinical staff supervised by the primary care provider, including individualized plan of care and coordination with other care providers 2. 24/7 contact phone numbers for assistance for urgent and routine care needs. 3. Service will only be billed when office clinical staff spend 20 minutes or more in a month to coordinate care. 4. Only one practitioner may furnish and bill the service in a calendar month. 5.The patient may stop CCM services at any time (effective at the end of the month) by phone call to the office staff. 6. The patient will be responsible for cost sharing (co-pay) of up to 20% of the service fee (after annual deductible is met). Patient agreed to services and consent obtained.  Patient Care Team: [name], MD as PCP - General Tat, Eustace Quail, DO as Consulting Physician (Neurology) Dimitri Ped, RN as Case Manager Viona Gilmore, [facility] as Pharmacist (Pharmacist)  Recent office  visits: 02/14/21 [name], MD (PCP) patient was seen for hospital follow up. Patient restarted Lorazepam 2 MG and nitroglycerin 0.4 MG was prescribed.  12/28/20 [name], LPN: presented for Medicare Wellness Exam.  10/02/20 [name], MD (PCP): patient presented for follow-up on medication and left shoulder pain.  Recent consult visits: 12/05/20 [name], MD Cardiology initial visit patient referred for cardiovascular evaluation.  Hospital visits: Medication Reconciliation was completed by comparing discharge summary, patient's EMR and Pharmacy list, and upon discussion with patient.   Admitted to the hospital on 04//14/2022 due to Acute Hypoxemic respiratory failure. Discharge date was 02/07/2021. Discharged from [facility]?Medications Started at [facility] Discharge:?? -started the following mediation Carvedilol (COREG) 3.125 mg one tablet by mouth two times daily meals Aspirin 81 mg: take one table daily Furosemide (Lasix) 40 mg: take one tablet as needed for fluid or edema Hydralazine (APRESOLINE)25 mg: take one tablet every 8 hours Isosorbide mononitrate (IMDUR) 30 mg 24 hr: one tablet by mouth daily [name] (PROVIGIL) 100 mg: one tablet daily Potassium Chloride ER 20 MEQ: take one tablet daily as needed. Only when taking laxis Spironolactone (ALDACTONE) 25 mg: take 0.5 tablets daily Ticagrelor (BRILINTA): 90 tablets: place one tablet into feeding tube twice daily   Medication Changes at Hospital Discharge: -Changed  Losartan (COZAAR) 25 mg: one tablet daily Rosuvastatin (CRESTOR) 10 mg: take one tablet daily   Medications Discontinued at Hospital Discharge: -Stopped the following medication Acetaminophen 325 MG tablet (TYLENOL) Amlodipine 5 MG tablet (NORVASC) Cyclobenzaprine 10 MG tablet (FLEXERIL) Duloxetine 20 MG capsule (  CYMBALTA) Finasteride 5 MG tablet ([name]) Fluad 0.5 ML Susy Lorazepam 2 MG tablet  (ATIVAN) Magic mouthwash solution Melatonin 10 MG Tabs Omeprazole 20 MG capsule (PRILOSEC) Tamsulosin 0.4 MG Caps capsule (FLOMAX) Triamcinolone cream 0.1 % (KENALOG)   Medications that remain the same after Hospital Discharge:?? -All other medications will remain the same.       Objective:  Lab Results  Component Value Date   CREATININE 1.61 (H) 03/05/2021   BUN 31 (H) 03/05/2021   GFR 64.40 05/17/2019   GFRNONAA 46 (L) 03/05/2021   GFRAA 81 10/25/2019   NA 135 03/05/2021   K 4.3 03/05/2021   CALCIUM 9.3 03/05/2021   CO2 22 03/05/2021   GLUCOSE 99 03/05/2021    Lab Results  Component Value Date/Time   HGBA1C 5.7 (H) 01/06/2021 04:08 AM   HGBA1C 5.5 05/24/2020 08:29 AM   GFR 64.40 05/17/2019 09:05 AM   GFR 68.66 05/07/2018 09:39 AM    Last diabetic Eye exam: No results found for: HMDIABEYEEXA  Last diabetic Foot exam: No results found for: HMDIABFOOTEX   Lab Results  Component Value Date   CHOL 145 05/24/2020   HDL 59 05/24/2020   LDLCALC 71 05/24/2020   LDLDIRECT 141.9 04/06/2013   TRIG 70 01/21/2021   CHOLHDL 2.5 05/24/2020    Hepatic Function Latest Ref Rng & Units 02/03/2021 01/31/2021 01/29/2021  Total Protein 6.5 - 8.1 g/dL 6.0(L) 5.7(L) -  Albumin 3.5 - 5.0 g/dL 2.6(L) 2.2(L) 2.3(L)  AST 15 - 41 U/L 18 26 -  ALT 0 - 44 U/L 45(H) 83(H) -  Alk Phosphatase 38 - 126 U/L 75 81 -  Total Bilirubin 0.3 - 1.2 mg/dL 0.9 0.4 -  Bilirubin, Direct 0.0 - 0.2 mg/dL - - -    Lab Results  Component Value Date/Time   TSH 1.63 05/24/2020 08:29 AM   TSH 1.62 05/17/2019 09:05 AM   FREET4 0.89 12/16/2016 02:44 PM    CBC Latest Ref Rng & Units 03/05/2021 02/06/2021 02/03/2021  WBC 4.0 - 10.5 K/uL 6.2 7.8 12.6(H)  Hemoglobin 13.0 - 17.0 g/dL 10.2(L) 8.0(L) 8.2(L)  Hematocrit 39.0 - 52.0 % 31.1(L) 25.5(L) 25.8(L)  Platelets 150 - 400 K/uL 223 149(L) 185    No results found for: VD25OH  Clinical ASCVD: Yes  The ASCVD Risk score Mikey Bussing DC Jr., et al., 2013) failed to  calculate for the following reasons:   The patient has a prior MI or stroke diagnosis    Depression screen [facility] 2/9 02/26/2021 12/28/2020 12/28/2020  Decreased Interest 0 0 0  Down, Depressed, Hopeless 0 0 0  PHQ - 2 Score 0 0 0  Some recent data might be hidden     Social History   Tobacco Use  Smoking Status Never  Smokeless Tobacco Never   BP Readings from Last 3 Encounters:  03/12/21 108/68  03/05/21 128/64  02/14/21 110/78   Pulse Readings from Last 3 Encounters:  03/12/21 74  03/05/21 71  02/14/21 82   Wt Readings from Last 3 Encounters:  03/12/21 177 lb (80.3 kg)  03/05/21 178 lb 9.6 oz (81 kg)  02/14/21 176 lb (79.8 kg)   BMI Readings from Last 3 Encounters:  03/12/21 23.35 kg/m  03/05/21 23.56 kg/m  02/14/21 23.22 kg/m    Assessment/Interventions: Review of patient past medical history, allergies, medications, health status, including review of consultants reports, laboratory and other test data, was performed as part of comprehensive evaluation and provision of chronic care management services.   SDOH:  (  Social Determinants of Health) assessments and interventions performed: Yes SDOH Interventions    Flowsheet Row Most Recent Value  SDOH Interventions   Financial Strain Interventions Intervention Not Indicated  Transportation Interventions Intervention Not Indicated      SDOH Screenings   Alcohol Screen: Low Risk    Last Alcohol Screening Score (AUDIT): 0  Depression (PHQ2-9): Low Risk    PHQ-2 Score: 0  Financial Resource Strain: Low Risk    Difficulty of Paying Living Expenses: Not hard at all  Food Insecurity: No Food Insecurity   Worried About Charity fundraiser in the Last Year: Never true   Ran Out of Food in the Last Year: Never true  Housing: Low Risk    Last Housing Risk Score: 0  Physical Activity: Sufficiently Active   Days of Exercise per Week: 7 days   Minutes of Exercise per Session: 30 min  Social Connections: Socially Isolated    Frequency of Communication with Friends and Family: More than three times a week   Frequency of Social Gatherings with Friends and Family: More than three times a week   Attends Religious Services: Never   Marine scientist or Organizations: No   Attends Music therapist: Never   Marital Status: Never married  Stress: No Stress Concern Present   Feeling of Stress : Not at all  Tobacco Use: Low Risk    Smoking Tobacco Use: Never   Smokeless Tobacco Use: Never  Transportation Needs: No Transportation Needs   Lack of Transportation (Medical): No   Lack of Transportation (Non-Medical): No   Patient was recently discharged from the hospital and has a lot more medications to manage. The CCM nurse told him about [facility] and he is already considering as he wants some help with managing his medications.   CCM Care Plan  Allergies  Allergen Reactions   Codeine Other (See Comments)    hallucinations   Lisinopril Cough    Medications Reviewed Today     Reviewed by [name], MD (Physician) on 03/12/21 at 1243  Med List Status: <None>   Medication Order Taking? Sig Documenting Provider Last Dose Status Informant  aspirin 81 MG chewable tablet [id number] Yes Chew 1 tablet (81 mg total) by mouth daily. [name], MD Taking Active   carvedilol (COREG) 3.125 MG tablet [id number] Yes Take 1 tablet (3.125 mg total) by mouth 2 (two) times daily with a meal. [name], MD Taking Active   dapagliflozin propanediol (FARXIGA) 10 MG TABS tablet [id number] Yes Take 1 tablet (10 mg total) by mouth daily before breakfast. Rafael Bihari, FNP Taking Active   finasteride ([name]) 5 MG tablet [id number] Yes Take 5 mg by mouth daily. [provider] Taking Active   furosemide (LASIX) 40 MG tablet [id number]  Take 1 tablet (40 mg total) by mouth daily as needed for fluid or edema (weight > 180 lb).  Patient not taking: No sig reported   Dessa Phi, DO   Expired 03/09/21 2359   hydrALAZINE (APRESOLINE) 25 MG tablet [id number] Yes Take 1 tablet (25 mg total) by mouth every 8 (eight) hours.  Patient taking differently: Take 25 mg by mouth daily.   [name], MD Taking Active   isosorbide mononitrate (IMDUR) 30 MG 24 hr tablet [id number] Yes Take 1 tablet (30 mg total) by mouth daily. [name], MD Taking Active   ketoconazole (NIZORAL) 2 % cream [id number] Yes Apply 1 application topically daily  as needed for irritation. [provider] Taking Active   LORazepam (ATIVAN) 1 MG tablet [id number] Yes Take 1 tablet (1 mg total) by mouth every 6 (six) hours as needed for anxiety.  Patient taking differently: Take 1 mg by mouth every 6 (six) hours as needed for anxiety.   [name], MD Taking Active   losartan (COZAAR) 25 MG tablet [id number] Yes Take 1 tablet (25 mg total) by mouth daily. [name], MD Taking Active   modafinil (PROVIGIL) 100 MG tablet [id number] No Take 1 tablet (100 mg total) by mouth daily.  Patient not taking: Reported on 03/12/2021   [name], MD Not Taking Active   nitroGLYCERIN (NITROSTAT) 0.4 MG SL tablet [id number] No Place 1 tablet (0.4 mg total) under the tongue every 5 (five) minutes as needed for chest pain.  Patient not taking: No sig reported   [name], MD Not Taking Active   omeprazole (PRILOSEC) 20 MG capsule [id number] Yes Take 20 mg by mouth as needed. [provider] Taking Active   potassium chloride SA (KLOR-CON) 20 MEQ tablet [id number] No Take 1 tablet (20 mEq total) by mouth daily as needed. - Take only when taking Lasix  Patient not taking: No sig reported   Dessa Phi, DO Not Taking Active   rosuvastatin (CRESTOR) 10 MG tablet [id number] Yes Take 1 tablet (10 mg total) by mouth daily. [name], MD Taking Active   spironolactone (ALDACTONE) 25 MG tablet [id number] Yes Take 0.5 tablets (12.5 mg total) by mouth daily. [name], MD Taking Active      Discontinued 12/17/11 1519 (Error)   tamsulosin (FLOMAX) 0.4 MG CAPS capsule [id number] Yes Take 0.4 mg by mouth. [provider] Taking Active   ticagrelor (BRILINTA) 90 MG TABS tablet [id number] Yes Place 1 tablet (90 mg total) into feeding tube 2 (two) times daily. [name], MD Taking Active   triamcinolone cream (KENALOG) 0.1 % [id number] Yes Apply 1 application topically 2 (two) times daily as needed. [provider] Taking Active             Patient Active Problem List   Diagnosis Date Noted   Hypertension    Hemoptysis    Acute hypoxemic respiratory failure ([place])    Acute renal failure ([place])    Encephalopathy acute    Cardiogenic shock ([place]) 01/03/2021   Atypical chest pain 10/25/2019   Hyperglycemia 10/12/2019   Coronary artery calcification seen on CAT scan 06/15/2019   Nodule of lower lobe of left lung 06/15/2019   Tremor of right hand 04/24/2017   Numbness and tingling of both legs 04/24/2017   Bilateral leg edema 06/05/2015   Chronic neck pain 06/05/2015   Plantar fasciitis 02/01/2014   Visual floaters 04/04/2013   Subjective vision disturbance, left 04/04/2013   BPH with urinary obstruction 01/01/2010   MELANOMA, HX OF 06/01/2009   NECK PAIN 09/11/2008   CELLULITIS AND ABSCESS OF LEG EXCEPT FOOT 04/26/2008   ERECTILE DYSFUNCTION 10/12/2007   Hyperlipemia, mixed 04/12/2007   Anxiety state 04/12/2007   Essential hypertension 04/12/2007   GERD 04/12/2007    Immunization History  Administered Date(s) Administered   Fluad Quad(high Dose 65+) 06/15/2019   Influenza Split 08/13/2011, 06/08/2012, 06/22/2013   Influenza Whole 06/22/2005, 06/19/2010   Influenza, High Dose Seasonal PF 07/05/2017, 05/28/2020   Influenza-Unspecified 07/14/2015, 07/12/2016, 07/05/2017   PFIZER(Purple Top)SARS-COV-2 Vaccination 10/31/2019, 11/26/2019, 07/07/2020   Pneumococcal Conjugate-13 08/11/2013   Pneumococcal Polysaccharide-23 04/23/2016  Td 06/19/2010    Tdap 09/12/2013   Zoster, Live 08/11/2013    Conditions to be addressed/monitored:  Hypertension, Hyperlipidemia, Heart Failure, Coronary Artery Disease, GERD, Depression, Anxiety, and BPH  Care Plan : [place]  Updates made by Viona Gilmore, [place] since 03/19/2021 12:00 AM     Problem: Problem: Hypertension, Hyperlipidemia, Heart Failure, Coronary Artery Disease, GERD, Depression, Anxiety, and BPH      Long-Range Goal: Patient-Specific Goal   Start Date: 03/07/2021  Expected End Date: 03/07/2022  This Visit's Progress: On track  Priority: High  Note:   Current Barriers:  Unable to independently monitor therapeutic efficacy  Pharmacist Clinical Goal(s):  Patient will achieve adherence to monitoring guidelines and medication adherence to achieve therapeutic efficacy through collaboration with PharmD and provider.   Interventions: 1:1 collaboration with [name], MD regarding development and update of comprehensive plan of care as evidenced by provider attestation and co-signature Inter-disciplinary care team collaboration (see longitudinal plan of care) Comprehensive medication review performed; medication list updated in electronic medical record  Hypertension (BP goal <130/80) -Controlled -Current treatment: Carvedilol 3.125 mg 1 tablet twice daily Losartan 25 mg 1 tablet daily Spironolactone 25 mg 1/2 tablet daily Hydralazine 25 mg 1 tablet every 8 hours as needed - taking one daily -Medications previously tried: n/a  -Current home readings: 120-130s/70s -Current dietary habits: strictly limiting salt intake  -Current exercise habits: did not discuss -Denies hypotensive/hypertensive symptoms -Educated on BP goals and benefits of medications for prevention of heart attack, stroke and kidney damage; Exercise goal of 150 minutes per week; Importance of home blood pressure monitoring; Proper BP monitoring technique; -Counseled to monitor BP at home  weekly, document, and provide log at future appointments -Counseled on diet and exercise extensively Recommended to continue current medication  Hyperlipidemia: (LDL goal < 70) -Not ideally controlled -Current treatment: Rosuvastatin 10 mg 1 tablet daily -Medications previously tried: none  -Current dietary patterns: not eating fried foods -Current exercise habits: PT exercises -Educated on Cholesterol goals;  Benefits of statin for ASCVD risk reduction; Exercise goal of 150 minutes per week; -Counseled on diet and exercise extensively Recommended to continue current medication Recommended repeat lipid panel  Heart Failure (Goal: manage symptoms and prevent exacerbations) -Controlled -Last ejection fraction: 25-30% (Date: 4/22) -HF type: Systolic -NYHA Class: II (slight limitation of activity) -AHA HF Stage: B (Heart disease present - no symptoms present) -Current treatment: Farxiga 10 mg 1 tablet before breakfast Furosemide 40 mg 1 tablet as needed Carvedilol 3.125 mg 1 tablet twice daily Losartan 25 mg 1 tablet daily Spironolactone 25 mg 1/2 tablet daily -Medications previously tried: none  -Current home BP/HR readings: 120-130s/70s -Current dietary habits: started a salt diary and not going over 1000 mg per day since starting -Current exercise habits: PT exercises every day; walking dog every morning -Educated on Benefits of medications for managing symptoms and prolonging life Importance of weighing daily; if you gain more than 3 pounds in one day or 5 pounds in one week, call cardiologist Proper diuretic administration and potassium supplementation -Counseled on diet and exercise extensively Recommended to continue current medication  CAD/STEMI (Goal: prevent future cardiac events) -Controlled -Current treatment  Aspirin 81 mg 1 tablet daily Brilinta 90 mg 1 tablet twice daily Isosorbide mononitrate 30 mg 1 tablet daily -Medications previously tried: n/a   -Recommended to continue current medication Counseled on difference between isosorbide and nitroglycerin. Patient is aware to avoid NSAIDs and to monitor for bleeding  Depression/Anxiety (Goal:  minimize symptoms) -Not ideally controlled -Current treatment: Lorazepam 1 mg as needed -Medications previously tried/failed: Cymbalta  -PHQ9: 0 -GAD7: n/a -Educated on Benefits of medication for symptom control Benefits of cognitive-behavioral therapy with or without medication -Recommended to continue current medication Counseled on risks of long term use of benzodiazepines  GERD (Goal: minimize symptoms) -Controlled -Current treatment  Omeprazole 20 mg 1 capsule daily as needed -Medications previously tried: none  -Recommended to continue current medication  BPH (Goal: minimize symptoms) -Controlled -Current treatment  Finasteride 5 mg 1 tablet daily -Medications previously tried: none  -Recommended to continue current medication   Health Maintenance -Vaccine gaps: shingrix, COVID booster -Current therapy:  Acetaminophen 500 mg as needed -Educated on Cost vs benefit of each product must be carefully weighed by individual consumer -Patient is satisfied with current therapy and denies issues -Recommended to continue current medication Educated on if he wanted to restart melatonin he could with a low dose  Patient Goals/Self-Care Activities Patient will:  - take medications as prescribed check blood pressure at least weekly, document, and provide at future appointments target a minimum of 150 minutes of moderate intensity exercise weekly  Follow Up Plan: Telephone follow up appointment with care management team member scheduled for: 3 months      Medication Assistance: None required.  Patient affirms current coverage meets needs.  Compliance/Adherence/Medication fill history: Care Gaps: Shingrix, COVID booster  Star-Rating Drugs: Losartan 25 mg - last filled 02/07/21  for 90 ds at [facility] Rosuvastatin 10 mg - last filled 02/07/21  Patient's preferred pharmacy is:  [id number] [place] [address]Phone: [phone]: [phone] [address] [place] [address]: [phone]: [address]Phone: [phone]: [phone] [facility] [place] [place]Phone: [phone]: [phone] Uses pill box? Yes Pt endorses 99% compliance - exception of hydralazine which he is taking once daily  We discussed: Benefits of medication synchronization, packaging and delivery as well as enhanced pharmacist oversight with [facility]. Patient decided to: Utilize [facility] pharmacy for medication synchronization, packaging and delivery  Care Plan and Follow Up Patient Decision:  Patient agrees to Care Plan and Follow-up.  Plan: Telephone follow up appointment with care management team member scheduled for:  3 months  MJeni Salles PharmD, BSiasconsetPharmacist [phone]

## 2021-03-11 ENCOUNTER — Other Ambulatory Visit: Payer: Self-pay

## 2021-03-11 DIAGNOSIS — I2119 ST elevation (STEMI) myocardial infarction involving other coronary artery of inferior wall: Secondary | ICD-10-CM | POA: Diagnosis not present

## 2021-03-11 DIAGNOSIS — Z48812 Encounter for surgical aftercare following surgery on the circulatory system: Secondary | ICD-10-CM | POA: Diagnosis not present

## 2021-03-11 DIAGNOSIS — I5021 Acute systolic (congestive) heart failure: Secondary | ICD-10-CM | POA: Diagnosis not present

## 2021-03-11 DIAGNOSIS — J9601 Acute respiratory failure with hypoxia: Secondary | ICD-10-CM | POA: Diagnosis not present

## 2021-03-11 DIAGNOSIS — I110 Hypertensive heart disease with heart failure: Secondary | ICD-10-CM | POA: Diagnosis not present

## 2021-03-11 DIAGNOSIS — I2510 Atherosclerotic heart disease of native coronary artery without angina pectoris: Secondary | ICD-10-CM | POA: Diagnosis not present

## 2021-03-11 DIAGNOSIS — M419 Scoliosis, unspecified: Secondary | ICD-10-CM | POA: Diagnosis not present

## 2021-03-11 DIAGNOSIS — N179 Acute kidney failure, unspecified: Secondary | ICD-10-CM | POA: Diagnosis not present

## 2021-03-11 DIAGNOSIS — M47816 Spondylosis without myelopathy or radiculopathy, lumbar region: Secondary | ICD-10-CM | POA: Diagnosis not present

## 2021-03-12 ENCOUNTER — Encounter: Payer: Self-pay | Admitting: Family Medicine

## 2021-03-12 ENCOUNTER — Ambulatory Visit: Payer: Medicare PPO | Admitting: Family Medicine

## 2021-03-12 VITALS — BP 108/68 | HR 74 | Temp 98.1°F | Wt 177.0 lb

## 2021-03-12 DIAGNOSIS — I10 Essential (primary) hypertension: Secondary | ICD-10-CM | POA: Diagnosis not present

## 2021-03-12 DIAGNOSIS — R570 Cardiogenic shock: Secondary | ICD-10-CM

## 2021-03-12 DIAGNOSIS — N170 Acute kidney failure with tubular necrosis: Secondary | ICD-10-CM | POA: Diagnosis not present

## 2021-03-12 DIAGNOSIS — G9340 Encephalopathy, unspecified: Secondary | ICD-10-CM

## 2021-03-12 DIAGNOSIS — F411 Generalized anxiety disorder: Secondary | ICD-10-CM

## 2021-03-12 DIAGNOSIS — J9601 Acute respiratory failure with hypoxia: Secondary | ICD-10-CM

## 2021-03-12 NOTE — Progress Notes (Signed)
   Subjective:    Patient ID: [name], male    DOB: 28-Jun-1951, 70 y.o.   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 He has continued to improve steadily, and he is pleased with his progress. He is getting PT and ST at home, and he feels he is getting stronger and more stable on his feet. No SOB or chest pain. He saw Cardiology on 03-05-21 and they were pleased with his progress. They obtained labs that day, and his Hgb has increased from 8.0 to 10.2. His creatinine has improved from 1.75 to 1.61, and the GFR has risen from 41 to 46. He also feels that his cognition has improved since getting home. He has lots of social interactions every day with friends and family, he is reading and doing crossword puzzles, etc. We had tried to prescribe him Modafanil, but this was declined by his insurance company. He is not yet driving.    Review of Systems  Constitutional: Negative.   Respiratory: Negative.    Cardiovascular: Negative.   Gastrointestinal: Negative.   Genitourinary: Negative.   Neurological: Negative.   Psychiatric/Behavioral: Negative.        Objective:   Physical Exam Constitutional:      Appearance: Normal appearance.  Cardiovascular:     Rate and Rhythm: Normal rate and regular rhythm.     Pulses: Normal pulses.     Heart sounds: Normal heart sounds.  Pulmonary:     Effort: Pulmonary effort is normal.     Breath sounds: Normal breath sounds.  Musculoskeletal:     Right lower leg: No edema.     Left lower leg: No edema.  Neurological:     General: No focal deficit present.     Mental Status: He is alert and oriented to person, place, and time.  Psychiatric:        Mood and Affect: Mood normal.        Behavior: Behavior normal.        Thought Content: Thought content normal.          Assessment & Plan:  He is recovering from a STEMI which led to CHF, renal failure, and encephalopathy nicely. We will continue to  monitor his renal function and anemia. He will follow up with Cardiology, and I believe they will get another ECHO at the next visit. We agreed today that he does not require Modafanil or any other mental stimulant at this point. His moods are stable off Duloxetine. His cognition is improving, so at this point we will allow him to drive short distances around his neighborhood (to the grocery store, pharmacy, etc) with a companion in the car with him. We will try this for one week. It this goes well, he can then drive short distances by himself. Eventually we hope he can drive longer distances but we will see how it goes. Follow up with me in 4-6 weeks. We spent 35 minutes reviewing his records and discussing these issues today. [name] Penna, MD

## 2021-03-13 DIAGNOSIS — I5021 Acute systolic (congestive) heart failure: Secondary | ICD-10-CM | POA: Diagnosis not present

## 2021-03-13 DIAGNOSIS — M47816 Spondylosis without myelopathy or radiculopathy, lumbar region: Secondary | ICD-10-CM | POA: Diagnosis not present

## 2021-03-13 DIAGNOSIS — N179 Acute kidney failure, unspecified: Secondary | ICD-10-CM | POA: Diagnosis not present

## 2021-03-13 DIAGNOSIS — I2510 Atherosclerotic heart disease of native coronary artery without angina pectoris: Secondary | ICD-10-CM | POA: Diagnosis not present

## 2021-03-13 DIAGNOSIS — M419 Scoliosis, unspecified: Secondary | ICD-10-CM | POA: Diagnosis not present

## 2021-03-13 DIAGNOSIS — I110 Hypertensive heart disease with heart failure: Secondary | ICD-10-CM | POA: Diagnosis not present

## 2021-03-13 DIAGNOSIS — I2119 ST elevation (STEMI) myocardial infarction involving other coronary artery of inferior wall: Secondary | ICD-10-CM | POA: Diagnosis not present

## 2021-03-13 DIAGNOSIS — Z48812 Encounter for surgical aftercare following surgery on the circulatory system: Secondary | ICD-10-CM | POA: Diagnosis not present

## 2021-03-13 DIAGNOSIS — J9601 Acute respiratory failure with hypoxia: Secondary | ICD-10-CM | POA: Diagnosis not present

## 2021-03-15 ENCOUNTER — Other Ambulatory Visit (HOSPITAL_COMMUNITY): Payer: Medicare PPO

## 2021-03-18 ENCOUNTER — Telehealth: Payer: Self-pay

## 2021-03-18 ENCOUNTER — Other Ambulatory Visit: Payer: Self-pay

## 2021-03-18 NOTE — Telephone Encounter (Signed)
Message sent to Dr Sarajane Jews for advise on pt Rx for Hydralizine

## 2021-03-19 NOTE — Patient Instructions (Signed)
Hi [name],  It was great to get to meet you in person! Below is a summary of some of the topics we discussed.   Please reach out to me if you have any questions or need anything before our follow up!  Best, Maddie  Jeni Salles, PharmD, [place]   Visit Information  Patient Care Plan: CCM Pharmacy Care Plan     Problem Identified: Problem: Hypertension, Hyperlipidemia, Heart Failure, Coronary Artery Disease, GERD, Depression, Anxiety, and BPH      Long-Range Goal: Patient-Specific Goal   Start Date: 03/07/2021  Expected End Date: 03/07/2022  This Visit's Progress: On track  Priority: High  Note:   Current Barriers:  Unable to independently monitor therapeutic efficacy  Pharmacist Clinical Goal(s):  Patient will achieve adherence to monitoring guidelines and medication adherence to achieve therapeutic efficacy through collaboration with PharmD and provider.   Interventions: 1:1 collaboration with [name], MD regarding development and update of comprehensive plan of care as evidenced by provider attestation and co-signature Inter-disciplinary care team collaboration (see longitudinal plan of care) Comprehensive medication review performed; medication list updated in electronic medical record  Hypertension (BP goal <130/80) -Controlled -Current treatment: Carvedilol 3.125 mg 1 tablet twice daily Losartan 25 mg 1 tablet daily Spironolactone 25 mg 1/2 tablet daily Hydralazine 25 mg 1 tablet every 8 hours as needed - taking one daily -Medications previously tried: n/a  -Current home readings: 120-130s/70s -Current dietary habits: strictly limiting salt intake  -Current exercise habits: did not discuss -Denies hypotensive/hypertensive symptoms -Educated on BP goals and benefits of medications for prevention of heart attack, stroke and kidney damage; Exercise goal of 150 minutes per week; Importance of  home blood pressure monitoring; Proper BP monitoring technique; -Counseled to monitor BP at home weekly, document, and provide log at future appointments -Counseled on diet and exercise extensively Recommended to continue current medication  Hyperlipidemia: (LDL goal < 70) -Not ideally controlled -Current treatment: Rosuvastatin 10 mg 1 tablet daily -Medications previously tried: none  -Current dietary patterns: not eating fried foods -Current exercise habits: PT exercises -Educated on Cholesterol goals;  Benefits of statin for ASCVD risk reduction; Exercise goal of 150 minutes per week; -Counseled on diet and exercise extensively Recommended to continue current medication Recommended repeat lipid panel  Heart Failure (Goal: manage symptoms and prevent exacerbations) -Controlled -Last ejection fraction: 25-30% (Date: 4/22) -HF type: Systolic -NYHA Class: II (slight limitation of activity) -AHA HF Stage: B (Heart disease present - no symptoms present) -Current treatment: Farxiga 10 mg 1 tablet before breakfast Furosemide 40 mg 1 tablet as needed Carvedilol 3.125 mg 1 tablet twice daily Losartan 25 mg 1 tablet daily Spironolactone 25 mg 1/2 tablet daily -Medications previously tried: none  -Current home BP/HR readings: 120-130s/70s -Current dietary habits: started a salt diary and not going over 1000 mg per day since starting -Current exercise habits: PT exercises every day; walking dog every morning -Educated on Benefits of medications for managing symptoms and prolonging life Importance of weighing daily; if you gain more than 3 pounds in one day or 5 pounds in one week, call cardiologist Proper diuretic administration and potassium supplementation -Counseled on diet and exercise extensively Recommended to continue current medication  CAD/STEMI (Goal: prevent future cardiac events) -Controlled -Current treatment  Aspirin 81 mg 1 tablet daily Brilinta 90 mg 1 tablet twice  daily Isosorbide mononitrate 30 mg 1 tablet daily -Medications previously tried: n/a  -Recommended to continue current medication Counseled on  difference between isosorbide and nitroglycerin. Patient is aware to avoid NSAIDs and to monitor for bleeding  Depression/Anxiety (Goal: minimize symptoms) -Not ideally controlled -Current treatment: Lorazepam 1 mg as needed -Medications previously tried/failed: Cymbalta  -PHQ9: 0 -GAD7: n/a -Educated on Benefits of medication for symptom control Benefits of cognitive-behavioral therapy with or without medication -Recommended to continue current medication Counseled on risks of long term use of benzodiazepines  GERD (Goal: minimize symptoms) -Controlled -Current treatment  Omeprazole 20 mg 1 capsule daily as needed -Medications previously tried: none  -Recommended to continue current medication  BPH (Goal: minimize symptoms) -Controlled -Current treatment  Finasteride 5 mg 1 tablet daily -Medications previously tried: none  -Recommended to continue current medication   Health Maintenance -Vaccine gaps: shingrix, COVID booster -Current therapy:  Acetaminophen 500 mg as needed -Educated on Cost vs benefit of each product must be carefully weighed by individual consumer -Patient is satisfied with current therapy and denies issues -Recommended to continue current medication Educated on if he wanted to restart melatonin he could with a low dose  Patient Goals/Self-Care Activities Patient will:  - take medications as prescribed check blood pressure at least weekly, document, and provide at future appointments target a minimum of 150 minutes of moderate intensity exercise weekly  Follow Up Plan: Telephone follow up appointment with care management team member scheduled for: 3 months       The patient verbalized understanding of instructions, educational materials, and care plan provided today and agreed to receive a mailed copy  of patient instructions, educational materials, and care plan.  Telephone follow up appointment with pharmacy team member scheduled for: 3 months  [name], [facility]

## 2021-03-19 NOTE — Telephone Encounter (Signed)
Please forward these to [facility]. The Hydralazine is supposed to be dosed TID so keep it this way

## 2021-03-20 ENCOUNTER — Other Ambulatory Visit (HOSPITAL_COMMUNITY): Payer: Medicare PPO

## 2021-03-20 DIAGNOSIS — M419 Scoliosis, unspecified: Secondary | ICD-10-CM | POA: Diagnosis not present

## 2021-03-20 DIAGNOSIS — M47816 Spondylosis without myelopathy or radiculopathy, lumbar region: Secondary | ICD-10-CM | POA: Diagnosis not present

## 2021-03-20 DIAGNOSIS — Z48812 Encounter for surgical aftercare following surgery on the circulatory system: Secondary | ICD-10-CM | POA: Diagnosis not present

## 2021-03-20 DIAGNOSIS — I110 Hypertensive heart disease with heart failure: Secondary | ICD-10-CM | POA: Diagnosis not present

## 2021-03-20 DIAGNOSIS — N179 Acute kidney failure, unspecified: Secondary | ICD-10-CM | POA: Diagnosis not present

## 2021-03-20 DIAGNOSIS — I2510 Atherosclerotic heart disease of native coronary artery without angina pectoris: Secondary | ICD-10-CM | POA: Diagnosis not present

## 2021-03-20 DIAGNOSIS — J9601 Acute respiratory failure with hypoxia: Secondary | ICD-10-CM | POA: Diagnosis not present

## 2021-03-20 DIAGNOSIS — I2119 ST elevation (STEMI) myocardial infarction involving other coronary artery of inferior wall: Secondary | ICD-10-CM | POA: Diagnosis not present

## 2021-03-20 DIAGNOSIS — I5021 Acute systolic (congestive) heart failure: Secondary | ICD-10-CM | POA: Diagnosis not present

## 2021-03-21 ENCOUNTER — Other Ambulatory Visit: Payer: Self-pay

## 2021-03-21 ENCOUNTER — Ambulatory Visit (HOSPITAL_COMMUNITY)
Admission: RE | Admit: 2021-03-21 | Discharge: 2021-03-21 | Disposition: A | Discharge status: Home / Self Care | Payer: Medicare PPO | Source: Ambulatory Visit | Attending: Internal Medicine | Admitting: Internal Medicine

## 2021-03-21 DIAGNOSIS — I5022 Chronic systolic (congestive) heart failure: Secondary | ICD-10-CM | POA: Diagnosis not present

## 2021-03-21 LAB — BASIC METABOLIC PANEL
Anion gap: 7 (ref 5–15)
BUN: 37 mg/dL — ABNORMAL HIGH (ref 8–23)
CO2: 22 mmol/L (ref 22–32)
Calcium: 9.1 mg/dL (ref 8.9–10.3)
Chloride: 106 mmol/L (ref 98–111)
Creatinine, Ser: 2.01 mg/dL — ABNORMAL HIGH (ref 0.61–1.24)
GFR, Estimated: 35 mL/min — ABNORMAL LOW (ref >60)
Glucose, Bld: 85 mg/dL (ref 70–99)
Potassium: 4.7 mmol/L (ref 3.5–5.1)
Sodium: 135 mmol/L (ref 135–145)

## 2021-03-21 MED ORDER — HYDRALAZINE HCL 25 MG PO TABS: 25.0000 mg | ORAL_TABLET | Freq: Every day | ORAL | 0 refills | Status: DC

## 2021-03-21 MED ORDER — ASPIRIN 81 MG PO CHEW: 81.0000 mg | CHEWABLE_TABLET | Freq: Every day | ORAL | 0 refills | Status: DC

## 2021-03-21 MED ORDER — FINASTERIDE 5 MG PO TABS: 5.0000 mg | ORAL_TABLET | Freq: Every day | ORAL | 0 refills | Status: DC

## 2021-03-21 MED ORDER — ROSUVASTATIN CALCIUM 10 MG PO TABS: 10.0000 mg | ORAL_TABLET | Freq: Every day | ORAL | 0 refills | Status: DC

## 2021-03-21 MED ORDER — OMEPRAZOLE 20 MG PO CPDR: 20.0000 mg | DELAYED_RELEASE_CAPSULE | ORAL | 0 refills | Status: DC | PRN

## 2021-03-21 MED ORDER — SPIRONOLACTONE 25 MG PO TABS: 12.5000 mg | ORAL_TABLET | Freq: Every day | ORAL | 5 refills | Status: DC

## 2021-03-21 MED ORDER — ISOSORBIDE MONONITRATE ER 30 MG PO TB24: 30.0000 mg | ORAL_TABLET | Freq: Every day | ORAL | 0 refills | Status: DC

## 2021-03-21 MED ORDER — TICAGRELOR 90 MG PO TABS: ORAL_TABLET | ORAL | 0 refills | Status: DC

## 2021-03-21 MED ORDER — LOSARTAN POTASSIUM 25 MG PO TABS: 25.0000 mg | ORAL_TABLET | Freq: Every day | ORAL | 0 refills | Status: DC

## 2021-03-21 MED ORDER — CARVEDILOL 3.125 MG PO TABS
3.1250 mg | ORAL_TABLET | Freq: Two times a day (BID) | ORAL | 1 refills | Status: DC
Start: 2021-03-21 — End: 2021-09-11

## 2021-03-21 NOTE — Telephone Encounter (Signed)
Pt Rx have been sent to [facility] per the pharmacist, Hydralazine Rx was changed to once daily per Dr Sarajane Jews

## 2021-03-27 DIAGNOSIS — I2119 ST elevation (STEMI) myocardial infarction involving other coronary artery of inferior wall: Secondary | ICD-10-CM | POA: Diagnosis not present

## 2021-03-27 DIAGNOSIS — M419 Scoliosis, unspecified: Secondary | ICD-10-CM | POA: Diagnosis not present

## 2021-03-27 DIAGNOSIS — I110 Hypertensive heart disease with heart failure: Secondary | ICD-10-CM | POA: Diagnosis not present

## 2021-03-27 DIAGNOSIS — Z48812 Encounter for surgical aftercare following surgery on the circulatory system: Secondary | ICD-10-CM | POA: Diagnosis not present

## 2021-03-27 DIAGNOSIS — I5021 Acute systolic (congestive) heart failure: Secondary | ICD-10-CM | POA: Diagnosis not present

## 2021-03-27 DIAGNOSIS — N179 Acute kidney failure, unspecified: Secondary | ICD-10-CM | POA: Diagnosis not present

## 2021-03-27 DIAGNOSIS — J9601 Acute respiratory failure with hypoxia: Secondary | ICD-10-CM | POA: Diagnosis not present

## 2021-03-27 DIAGNOSIS — M47816 Spondylosis without myelopathy or radiculopathy, lumbar region: Secondary | ICD-10-CM | POA: Diagnosis not present

## 2021-03-27 DIAGNOSIS — I2510 Atherosclerotic heart disease of native coronary artery without angina pectoris: Secondary | ICD-10-CM | POA: Diagnosis not present

## 2021-03-29 ENCOUNTER — Ambulatory Visit (INDEPENDENT_AMBULATORY_CARE_PROVIDER_SITE_OTHER): Payer: Medicare PPO

## 2021-03-29 DIAGNOSIS — I10 Essential (primary) hypertension: Secondary | ICD-10-CM | POA: Diagnosis not present

## 2021-03-29 DIAGNOSIS — E782 Mixed hyperlipidemia: Secondary | ICD-10-CM

## 2021-03-29 DIAGNOSIS — R570 Cardiogenic shock: Secondary | ICD-10-CM

## 2021-03-29 NOTE — Chronic Care Management (AMB) (Signed)
Chronic Care Management   CCM RN Visit Note  03/29/2021 Name: [name] MRN: [id number] DOB: [birth year]  Subjective: [name] is a 70 y.o. year old male who is a primary care patient of [name], MD. The care management team was consulted for assistance with disease management and care coordination needs.    Engaged with patient by telephone for follow up visit in response to provider referral for case management and/or care coordination services.   Consent to Services:  The patient was given information about Chronic Care Management services, agreed to services, and gave verbal consent prior to initiation of services.  Please see initial visit note for detailed documentation.   Patient agreed to services and verbal consent obtained.   Assessment: Review of patient past medical history, allergies, medications, health status, including review of consultants reports, laboratory and other test data, was performed as part of comprehensive evaluation and provision of chronic care management services.   SDOH (Social Determinants of Health) assessments and interventions performed:    CCM Care Plan  Allergies  Allergen Reactions   Codeine Other (See Comments)    hallucinations   Lisinopril Cough    Outpatient Encounter Medications as of 03/29/2021  Medication Sig   aspirin 81 MG chewable tablet Chew 1 tablet (81 mg total) by mouth daily.   carvedilol (COREG) 3.125 MG tablet Take 1 tablet (3.125 mg total) by mouth 2 (two) times daily with a meal.   dapagliflozin propanediol (FARXIGA) 10 MG TABS tablet Take 1 tablet (10 mg total) by mouth daily before breakfast.   finasteride ([name]) 5 MG tablet Take 1 tablet (5 mg total) by mouth daily.   furosemide (LASIX) 40 MG tablet Take 1 tablet (40 mg total) by mouth daily as needed for fluid or edema (weight > 180 lb). (Patient not taking: No sig reported)   hydrALAZINE (APRESOLINE) 25 MG tablet Take 1 tablet (25 mg total) by  mouth daily.   isosorbide mononitrate (IMDUR) 30 MG 24 hr tablet Take 1 tablet (30 mg total) by mouth daily.   ketoconazole (NIZORAL) 2 % cream Apply 1 application topically daily as needed for irritation.   LORazepam (ATIVAN) 1 MG tablet Take 1 tablet (1 mg total) by mouth every 6 (six) hours as needed for anxiety. (Patient taking differently: Take 1 mg by mouth every 6 (six) hours as needed for anxiety.)   losartan (COZAAR) 25 MG tablet Take 1 tablet (25 mg total) by mouth daily.   nitroGLYCERIN (NITROSTAT) 0.4 MG SL tablet Place 1 tablet (0.4 mg total) under the tongue every 5 (five) minutes as needed for chest pain. (Patient not taking: No sig reported)   omeprazole (PRILOSEC) 20 MG capsule Take 1 capsule (20 mg total) by mouth as needed.   potassium chloride SA (KLOR-CON) 20 MEQ tablet Take 1 tablet (20 mEq total) by mouth daily as needed. - Take only when taking Lasix (Patient not taking: No sig reported)   rosuvastatin (CRESTOR) 10 MG tablet Take 1 tablet (10 mg total) by mouth daily.   spironolactone (ALDACTONE) 25 MG tablet Take 0.5 tablets (12.5 mg total) by mouth daily.   tamsulosin (FLOMAX) 0.4 MG CAPS capsule Take 0.4 mg by mouth.   ticagrelor (BRILINTA) 90 MG TABS tablet Place 1 tablet (90 mg total) by mouth twice daily.   triamcinolone cream (KENALOG) 0.1 % Apply 1 application topically 2 (two) times daily as needed.   [DISCONTINUED] tadalafil (CIALIS) 20 MG tablet Take 20 mg by mouth daily as  needed.     No facility-administered encounter medications on file as of 03/29/2021.    Patient Active Problem List   Diagnosis Date Noted   Hypertension    Hemoptysis    Acute hypoxemic respiratory failure (HCC)    Acute renal failure (HCC)    Encephalopathy acute    Cardiogenic shock ([place]) 01/03/2021   Atypical chest pain 10/25/2019   Hyperglycemia 10/12/2019   Coronary artery calcification seen on CAT scan 06/15/2019   Nodule of lower lobe of left lung 06/15/2019   Tremor of right  hand 04/24/2017   Numbness and tingling of both legs 04/24/2017   Bilateral leg edema 06/05/2015   Chronic neck pain 06/05/2015   Plantar fasciitis 02/01/2014   Visual floaters 04/04/2013   Subjective vision disturbance, left 04/04/2013   BPH with urinary obstruction 01/01/2010   MELANOMA, HX OF 06/01/2009   NECK PAIN 09/11/2008   CELLULITIS AND ABSCESS OF LEG EXCEPT FOOT 04/26/2008   ERECTILE DYSFUNCTION 10/12/2007   Hyperlipemia, mixed 04/12/2007   Anxiety state 04/12/2007   Essential hypertension 04/12/2007   GERD 04/12/2007    Conditions to be addressed/monitored:CHF, CAD, HTN, and HLD  Care Plan : RNCM:Cardiovascular Disease Management(HF, CAD, HTN, HLD)  Updates made by Dimitri Ped, RN since 03/29/2021 12:00 AM     Problem: Lack of long term management of Cardiovascular Disease Management(HF, CAD, HTN, HLD)   Priority: High     Long-Range Goal: Effective self managment of Cardiovascular Disease(HF, CAD, HTN, HLD)   Start Date: 02/26/2021  Expected End Date: 08/22/2021  This Visit's Progress: On track  Recent Progress: On track  Priority: High  Note:   Current Barriers:  Knowledge deficits related to basic heart failure pathophysiology and self care management, hx of CAD,HTN,HLD Unable to independently self management of Cardiovascular Disease (HF, CAD, HTN, HLD) Unable to perform IADLs independently Pt hospitalized from 01/03/21-02/07/21 with cardiogenic shock and STEMI. Pt lives alone but has a very supportive network of friends and neighbors who have been assisting him with meals, shopping and transportation.  States he has completed PT/OT and he has been getting stronger every day.  States he does his exercises daily.  States he does have some shortness of breath when he is doing his exercise or household chores.  Denies any chest pains but states he did take his NTG one day last week when he felt a mild pain in his lt chest which it did revieve the discomfort States  he is weighing, checking B/P and O2 sat daily.  States his weight has been stable between 172-175.  B/P was 128/74 today.  States he is keeping a log of his readings and he did get the calendar book RNCM sent him.  States he has been following a low sodium diet and trying to eat more vegetables and fruits. States he got his medications delivered from [facility] yesterday Nurse Case Manager Clinical Goal(s):  patient will weigh self daily and record patient will verbalize understanding of Heart Failure Action Plan and when to call doctor patient will take all Heart Failure mediations as prescribed Interventions:  Collaboration with [name], MD regarding development and update of comprehensive plan of care as evidenced by provider attestation and co-signature Inter-disciplinary care team collaboration (see longitudinal plan of care) Reviewed upcoming provider visits-Heart and vascular 04/16/21, Dr, Sarajane Jews 04/18/21, CCM-pharmacist 06/06/21 Reviewed basic overview and discussion of pathophysiology of Heart Failure, HTN,HLD Reinforced to follow a low sodium diet Reviewed Heart Failure Action Plan in  [place]  calendar Reinforced importance of daily weight Reinforced role of diuretics in prevention of fluid overload Reinforced how to use NTG tablets as needed and when to call 911 Referred to CCM pharmacist to review medications and is interested in changing pharmacies as his current pharmacy has a very confusing refill process-see by pharmacist and has started pre-packaged medications from [employer]  calendar with action plans for HF and HTN-received calender and voices he is using to record readings Self-Care Activities:  Takes Heart Failure Medications as prescribed Weighs daily and record (notifying MD of 3 lb weight gain over night or 5 lb in a week) Verbalizes understanding of and follows CHF Action Plan Adheres to low sodium diet  Patient Goals:  -  Take Heart Failure Medications as prescribed - Weigh daily and record (notify MD with 3 lb weight gain over night or 5 lb in a week) - Follow CHF Action Plan - Adhere to low sodium diet - begin a heart failure diary - bring diary to all appointments - eat more whole grains, fruits and vegetables, lean meats and healthy fats - follow rescue plan if symptoms flare-up - know when to call the doctor - track symptoms and what helps feel better or worse - dress right for the weather, hot or cold - follow activity or exercise plan - check blood pressure daily - choose a place to take my blood pressure (home, clinic or office, retail store) - write blood pressure results in a log or diary   -Take B/P medications as ordered -Plan to follow a low salt diet  -Increase activity as tolerated  Follow Up Plan: Telephone follow up appointment with care management team member scheduled for: 05/03/21 at 10 AM The patient has been provided with contact information for the care management team and has been advised to call with any health related questions or concerns.       Plan:Telephone follow up appointment with care management team member scheduled for:  05/03/21 and The patient has been provided with contact information for the care management team and has been advised to call with any health related questions or concerns.  Peter Garter RN, [facility] [place], CDE Care Management Coordinator [facility] [phone], Mobile [phone]

## 2021-03-29 NOTE — Patient Instructions (Signed)
Visit Information  PATIENT GOALS:  Goals Addressed             This Visit's Progress    RNCM:Track and Manage My Blood Pressure-Hypertension   On track    Timeframe:  Long-Range Goal Priority:  Medium Start Date:  02/26/21                           Expected End Date:      08/22/21                 Follow Up Date 05/03/21    - check blood pressure daily - choose a place to take my blood pressure (home, clinic or office, retail store) - write blood pressure results in a log or diary   -Take B/P medications as ordered -Plan to follow a low salt diet  -Increase activity as tolerated Why is this important?   You won't feel high blood pressure, but it can still hurt your blood vessels.  High blood pressure can cause heart or kidney problems. It can also cause a stroke.  Making lifestyle changes like losing a little weight or eating less salt will help.  Checking your blood pressure at home and at different times of the day can help to control blood pressure.  If the doctor prescribes medicine remember to take it the way the doctor ordered.  Call the office if you cannot afford the medicine or if there are questions about it.     Notes:       RNCM:Track and Manage Symptoms-Heart Failure   On track    Timeframe:  Long-Range Goal Priority:  High Start Date:       02/26/21                      Expected End Date:       08/22/21                Follow Up Date 05/03/21    - begin a heart failure diary - develop a rescue plan - eat more whole grains, fruits and vegetables, lean meats and healthy fats - follow rescue plan if symptoms flare-up - know when to call the doctor - track symptoms and what helps feel better or worse - dress right for the weather, hot or cold    Why is this important?   You will be able to handle your symptoms better if you keep track of them.  Making some simple changes to your lifestyle will help.  Eating healthy is one thing you can do to take good care of  yourself.    Notes:        Cooking With Less Salt Cooking with less salt is one way to reduce the amount of sodium you get from food. Sodium is one of the elements that make up salt. It is found naturally in foods and is also added to certain foods. Depending on your condition and overall health, your health care provider or dietitian may recommend that you reduce your sodium intake. Most people should have less than 2,300 milligrams (mg) of sodium each day. If you have high blood pressure (hypertension), you may need to limit your sodium to 1,500 mg each day. Follow the tipsbelow to help reduce your sodium intake. What are tips for eating less sodium? Reading food labels  Check the food label before buying or using packaged ingredients. Always check the  label for the serving size and sodium content. Look for products with no more than 140 mg of sodium in one serving. Check the % Daily Value column to see what percent of the daily recommended amount of sodium is provided in one serving of the product. Foods with 5% or less in this column are considered low in sodium. Foods with 20% or higher are considered high in sodium. Do not choose foods with salt as one of the first three ingredients on the ingredients list. If salt is one of the first three ingredients, it usually means the item is high in sodium.  Shopping Buy sodium-free or low-sodium products. Look for the following words on food labels: Low-sodium. Sodium-free. Reduced-sodium. No salt added. Unsalted. Always check the sodium content even if foods are labeled as low-sodium or no salt added. Buy fresh foods. Cooking Use herbs, seasonings without salt, and spices as substitutes for salt. Use sodium-free baking soda when baking. Grill, braise, or roast foods to add flavor with less salt. Avoid adding salt to pasta, rice, or hot cereals. Drain and rinse canned vegetables, beans, and meat before use. Avoid adding salt when cooking  sweets and desserts. Cook with low-sodium ingredients. What foods are high in sodium? Vegetables Regular canned vegetables (not low-sodium or reduced-sodium). Sauerkraut, pickled vegetables, and relishes. Olives. [place] fries. Onion rings. Regular canned tomato sauce and paste. Regular tomato and vegetable juice. Frozenvegetables in sauces. Grains Instant hot cereals. Bread stuffing, pancake, and biscuit mixes. Croutons. Seasoned rice or pasta mixes. Noodle soup cups. Boxed or frozen macaroni and cheese. Regular salted crackers. Self-rising flour. Rolls. Bagels. Flourtortillas and wraps. Meats and other proteins Meat or fish that is salted, canned, smoked, cured, spiced, or pickled. This includes bacon, ham, sausages, hot dogs, corned beef, chipped beef, meat loaves, salt pork, jerky, pickled herring, anchovies, regular canned tuna, andsardines. Salted nuts. Dairy Processed cheese and cheese spreads. Cheese curds. Blue cheese. Feta cheese.String cheese. Regular cottage cheese. Buttermilk. Canned milk. The items listed above may not be a complete list of foods high in sodium. Actual amounts of sodium may be different depending on processing. Contact a dietitian for more information. What foods are low in sodium? Fruits Fresh, frozen, or canned fruit with no sauce added. Fruit juice. Vegetables Fresh or frozen vegetables with no sauce added. "No salt added" canned vegetables. "No salt added" tomato sauce and paste. Low-sodium orreduced-sodium tomato and vegetable juice. Grains Noodles, pasta, quinoa, rice. Shredded or puffed wheat or puffed rice. Regular or quick oats (not instant). Low-sodium crackers. Low-sodium bread. Whole-grainbread and whole-grain pasta. Unsalted popcorn. Meats and other proteins Fresh or frozen whole meats, poultry (not injected with sodium), and fish with no sauce added. Unsalted nuts. Dried peas, beans, and lentils without added salt. Unsalted canned beans. Eggs. Unsalted  nut butters. Low-sodium canned tunaor chicken. Dairy Milk. Soy milk. Yogurt. Low-sodium cheeses, such as Swiss, Monterey Jack, [place], and [employer]. Sherbet or ice cream (keep to  cup per serving).Cream cheese. Fats and oils Unsalted butter or margarine. Other foods Homemade pudding. Sodium-free baking soda and baking powder. Herbs and spices.Low-sodium seasoning mixes. Beverages Coffee and tea. Carbonated beverages. The items listed above may not be a complete list of foods low in sodium. Actual amounts of sodium may be different depending on processing. Contact a dietitian for more information. What are some salt alternatives when cooking? The following are herbs, seasonings, and spices that can be used instead of salt to flavor your food. Herbs should be fresh  or dried. Do not choose packaged mixes. Next to the name of the herb, spice, or seasoning aresome examples of foods you can pair it with. Herbs [place] leaves - Soups, meat and vegetable dishes, and spaghetti sauce. Basil - [employer], soups, pasta, and fish dishes. Cilantro - Meat, poultry, and vegetable dishes. Chili powder - Marinades and Mexican dishes. Chives - Salad dressings and potato dishes. Cumin - Mexican dishes, couscous, and meat dishes. Dill - Fish dishes, sauces, and salads. Fennel - Meat and vegetable dishes, breads, and cookies. Garlic (do not use garlic salt) - [place] dishes, meat dishes, salad dressings, and sauces. Marjoram - Soups, potato dishes, and meat dishes. Oregano - Pizza and spaghetti sauce. Parsley - Salads, soups, pasta, and meat dishes. Rosemary - [place] dishes, salad dressings, soups, and red meats. Saffron - Fish dishes, pasta, and some poultry dishes. Sage - Stuffings and sauces. Tarragon - Fish and [employer]. Thyme - Stuffing, meat, and fish dishes. Seasonings Lemon juice - Fish dishes, poultry dishes, vegetables, and salads. Vinegar - Salad dressings, vegetables, and fish  dishes. Spices Cinnamon - Sweet dishes, such as cakes, cookies, and puddings. Cloves - Gingerbread, puddings, and marinades for meats. Curry - Vegetable dishes, fish and poultry dishes, and stir-fry dishes. Ginger - Vegetable dishes, fish dishes, and stir-fry dishes. Nutmeg - Pasta, vegetables, poultry, fish dishes, and custard. Summary Cooking with less salt is one way to reduce the amount of sodium that you get from food. Buy sodium-free or low-sodium products. Check the food label before using or buying packaged ingredients. Use herbs, seasonings without salt, and spices as substitutes for salt in foods. This information is not intended to replace advice given to you by your health care provider. Make sure you discuss any questions you have with your healthcare provider. Document Revised: 08/31/2019 Document Reviewed: 08/31/2019 Elsevier Patient Education  2022 [place].   Patient verbalizes understanding of instructions provided today and agrees to view in [place].   Telephone follow up appointment with care management team member scheduled for:  Peter Garter RN, [facility], CDE Care Management Coordinator [place] [phone], Mobile [phone]

## 2021-04-02 ENCOUNTER — Other Ambulatory Visit: Payer: Self-pay | Admitting: Family Medicine

## 2021-04-05 ENCOUNTER — Other Ambulatory Visit: Payer: Self-pay | Admitting: Family Medicine

## 2021-04-15 ENCOUNTER — Ambulatory Visit: Payer: Medicare PPO | Admitting: Family Medicine

## 2021-04-16 ENCOUNTER — Other Ambulatory Visit (HOSPITAL_COMMUNITY): Payer: Self-pay

## 2021-04-16 ENCOUNTER — Other Ambulatory Visit: Payer: Self-pay

## 2021-04-16 ENCOUNTER — Ambulatory Visit (HOSPITAL_COMMUNITY)
Admission: RE | Admit: 2021-04-16 | Discharge: 2021-04-16 | Disposition: A | Discharge status: Home / Self Care | Payer: Medicare PPO | Source: Ambulatory Visit | Attending: Family Medicine | Admitting: Family Medicine

## 2021-04-16 ENCOUNTER — Encounter (HOSPITAL_COMMUNITY): Payer: Self-pay

## 2021-04-16 ENCOUNTER — Telehealth: Payer: Self-pay | Admitting: Pharmacist

## 2021-04-16 VITALS — BP 108/70 | HR 64 | Wt 180.2 lb

## 2021-04-16 DIAGNOSIS — I2583 Coronary atherosclerosis due to lipid rich plaque: Secondary | ICD-10-CM | POA: Diagnosis not present

## 2021-04-16 DIAGNOSIS — R0902 Hypoxemia: Secondary | ICD-10-CM | POA: Insufficient documentation

## 2021-04-16 DIAGNOSIS — Z7902 Long term (current) use of antithrombotics/antiplatelets: Secondary | ICD-10-CM | POA: Insufficient documentation

## 2021-04-16 DIAGNOSIS — I5022 Chronic systolic (congestive) heart failure: Secondary | ICD-10-CM

## 2021-04-16 DIAGNOSIS — Z79899 Other long term (current) drug therapy: Secondary | ICD-10-CM | POA: Diagnosis not present

## 2021-04-16 DIAGNOSIS — N1832 Chronic kidney disease, stage 3b: Secondary | ICD-10-CM | POA: Diagnosis not present

## 2021-04-16 DIAGNOSIS — I2510 Atherosclerotic heart disease of native coronary artery without angina pectoris: Secondary | ICD-10-CM | POA: Diagnosis not present

## 2021-04-16 DIAGNOSIS — Z7982 Long term (current) use of aspirin: Secondary | ICD-10-CM | POA: Insufficient documentation

## 2021-04-16 DIAGNOSIS — G931 Anoxic brain damage, not elsewhere classified: Secondary | ICD-10-CM | POA: Diagnosis not present

## 2021-04-16 DIAGNOSIS — Z8249 Family history of ischemic heart disease and other diseases of the circulatory system: Secondary | ICD-10-CM | POA: Diagnosis not present

## 2021-04-16 LAB — BASIC METABOLIC PANEL
Anion gap: 6 (ref 5–15)
BUN: 35 mg/dL — ABNORMAL HIGH (ref 8–23)
CO2: 24 mmol/L (ref 22–32)
Calcium: 9 mg/dL (ref 8.9–10.3)
Chloride: 105 mmol/L (ref 98–111)
Creatinine, Ser: 1.88 mg/dL — ABNORMAL HIGH (ref 0.61–1.24)
GFR, Estimated: 38 mL/min — ABNORMAL LOW (ref >60)
Glucose, Bld: 103 mg/dL — ABNORMAL HIGH (ref 70–99)
Potassium: 4.5 mmol/L (ref 3.5–5.1)
Sodium: 135 mmol/L (ref 135–145)

## 2021-04-16 MED ORDER — ENTRESTO 24-26 MG PO TABS: 1.0000 | ORAL_TABLET | Freq: Two times a day (BID) | ORAL | 11 refills | Status: DC

## 2021-04-16 NOTE — Chronic Care Management (AMB) (Addendum)
Chronic Care Management Pharmacy Assistant   Name: [name]  MRN: [id number] DOB: 09-02-1951  Reason for Encounter: Medication Review-Medication Coordination Call   Recent office visits:  None  Recent consult visits:  None  Hospital visits:  None in previous 6 months  Medications: Outpatient Encounter Medications as of 04/16/2021  Medication Sig   aspirin 81 MG chewable tablet Chew 1 tablet (81 mg total) by mouth daily.   carvedilol (COREG) 3.125 MG tablet Take 1 tablet (3.125 mg total) by mouth 2 (two) times daily with a meal.   dapagliflozin propanediol (FARXIGA) 10 MG TABS tablet Take 1 tablet (10 mg total) by mouth daily before breakfast.   finasteride ([name]) 5 MG tablet Take 1 tablet (5 mg total) by mouth daily.   furosemide (LASIX) 40 MG tablet Take 1 tablet (40 mg total) by mouth daily as needed for fluid or edema (weight > 180 lb). (Patient not taking: No sig reported)   hydrALAZINE (APRESOLINE) 25 MG tablet Take 1 tablet (25 mg total) by mouth daily.   isosorbide mononitrate (IMDUR) 30 MG 24 hr tablet Take 1 tablet (30 mg total) by mouth daily.   ketoconazole (NIZORAL) 2 % cream Apply 1 application topically daily as needed for irritation.   LORazepam (ATIVAN) 1 MG tablet Take 1 tablet (1 mg total) by mouth every 6 (six) hours as needed for anxiety. (Patient taking differently: Take 1 mg by mouth every 6 (six) hours as needed for anxiety.)   LORazepam (ATIVAN) 2 MG tablet TAKE 1 TABLET 6 HOURS AS NEEDED FOR ANXIETY. (DOSE CHANGE, FILL 10/14/20 BASED ON PREV PICKUP)   losartan (COZAAR) 25 MG tablet TAKE 1 TABLET (25 MG TOTAL) BY MOUTH DAILY.   nitroGLYCERIN (NITROSTAT) 0.4 MG SL tablet Place 1 tablet (0.4 mg total) under the tongue every 5 (five) minutes as needed for chest pain. (Patient not taking: No sig reported)   omeprazole (PRILOSEC) 20 MG capsule Take 1 capsule (20 mg total) by mouth as needed.   potassium chloride SA (KLOR-CON) 20 MEQ tablet Take 1 tablet  (20 mEq total) by mouth daily as needed. - Take only when taking Lasix (Patient not taking: No sig reported)   rosuvastatin (CRESTOR) 10 MG tablet Take 1 tablet (10 mg total) by mouth daily.   spironolactone (ALDACTONE) 25 MG tablet Take 0.5 tablets (12.5 mg total) by mouth daily.   tamsulosin (FLOMAX) 0.4 MG CAPS capsule Take 0.4 mg by mouth.   ticagrelor (BRILINTA) 90 MG TABS tablet Place 1 tablet (90 mg total) by mouth twice daily.   triamcinolone cream (KENALOG) 0.1 % Apply 1 application topically 2 (two) times daily as needed.   [DISCONTINUED] tadalafil (CIALIS) 20 MG tablet Take 20 mg by mouth daily as needed.     No facility-administered encounter medications on file as of 04/16/2021.  Reviewed chart for medication changes ahead of medication coordination call.  BP Readings from Last 3 Encounters:  03/12/21 108/68  03/05/21 128/64  02/14/21 110/78    Lab Results  Component Value Date   HGBA1C 5.7 (H) 01/06/2021    Patient obtains medications through Adherence Packaging  30 Days  Last adherence delivery included: Hydralazine (APRESOLINE) 25 mg: one tablet breakfast Isosorbide mononitrate (IMDUR) 30 mg 24 hour: one tablet breakfast Carvedilol (COREG) 3.125 mg: one tablet breakfast Aspirin 81 MG chewable tablet. Rosuvastatin (CRESTOR) 10 mg: one tablet breakfast Spironolactone (ALDACTONE) 25 mg: take (0.5) half tablet breakfast Ticagrelor (BRILINTA) 90 mg: one tablet breakfast Omeprazole (PRILOSEC) 20 mg:  one capsule breakfast Finasteride ([name]) 5 mg: one tablet before breakfast Dapagliflozin propanediol (FARXIGA) 10 mg: one tablet breakfast Losartan 25 mg:one tablet at breakfast  Patient is due for next adherence delivery on: 08.04.2022. Called patient and reviewed medications and coordinated delivery. This delivery to include: Hydralazine (APRESOLINE) 25 mg: one tablet at breakfast Isosorbide mononitrate (IMDUR) 30 mg 24 hour: one tablet at breakfast Carvedilol (COREG)  3.125 mg: one tablet at breakfast and one tablet at dinner Rosuvastatin (CRESTOR) 10 mg: one tablet at breakfast Spironolactone (ALDACTONE) 25 mg: take (0.5) half tablet at breakfast Ticagrelor (BRILINTA) 90 mg: one tablet at breakfast and one tablet at dinner Finasteride ([name]) 5 mg: one tablet before breakfast Dapagliflozin propanediol (FARXIGA) 10 mg: one tablet before breakfast  Coordinated acute fill for the following medication to be delivered 07.27.2022 for 90 day supply Sacubitril-valsartan (ENTRESTO) 24-26 mg: one tablet at breakfast and one a dinner (Please keep this medication in 90 day supply vials- It is more cost effective for 90 days.)  Patient declined the following medications due to cost Aspirin 81 MG chewable tablet.( Patient will but OTC) Omeprazole (PRILOSEC) 20 mg: one capsule breakfast ( patient takes PRN)  He currently does not have need refills. Confirmed delivery date of 08.04.2022, advised patient that pharmacy will contact them the morning of delivery.   Care Gaps: Zoster Vaccines COVID-19 4th booster  Star Rating Drugs: Medication Dispensed Quantity Pharmacy  Sacubitril-valsartan (ENTRESTO) 24-26 mg 07.27.2022 [facility]  Rosuvastatin (CRESTOR) 10 mg 07.08.2022 30 [facility]   Amilia Revonda Standard, [place] Pharmacist Assistant [phone]

## 2021-04-16 NOTE — Patient Instructions (Signed)
STOP Losartan START Entresto 24/26 mg one tab twice daily  Labs today We will only contact you if something comes back abnormal or we need to make some changes. Otherwise no news is good news!  Labs needed in 10 days  Your physician recommends that you schedule a follow-up appointment in: 6-8 weeks with Dr Haroldine Laws with echo  Your physician has requested that you have an echocardiogram. Echocardiography is a painless test that uses sound waves to create images of your heart. It provides your doctor with information about the size and shape of your heart and how well your heart's chambers and valves are working. This procedure takes approximately one hour. There are no restrictions for this procedure.  Do the following things EVERYDAY: Weigh yourself in the morning before breakfast. Write it down and keep it in a log. Take your medicines as prescribed Eat low salt foods--Limit salt (sodium) to 2000 mg per day.  Stay as active as you can everyday Limit all fluids for the day to less than 2 liters  milAt the [facility], you and your health needs are our priority. As part of our continuing mission to provide you with exceptional heart care, we have created designated Provider Care Teams. These Care Teams include your primary Cardiologist (physician) and Advanced Practice Providers (APPs- Physician Assistants and Nurse Practitioners) who all work together to provide you with the care you need, when you need it.   You may see any of the following providers on your designated Care Team at your next follow up: Dr Glori Bickers Dr Loralie Champagne Dr Patrice Paradise, NP Lyda Jester, [place] Ginnie Smart Audry Riles, PharmD   Please be sure to bring in all your medications bottles to every appointment.   If you have any questions or concerns before your next appointment please send [place] a message through [place] or call our office at [phone].    TO  LEAVE A MESSAGE FOR THE NURSE SELECT OPTION 2, PLEASE LEAVE A MESSAGE INCLUDING: YOUR NAME DATE OF BIRTH CALL BACK NUMBER REASON FOR CALL**this is important as we prioritize the call backs  YOU WILL RECEIVE A CALL BACK THE SAME DAY AS LONG AS YOU CALL BEFORE 4:00 PM

## 2021-04-16 NOTE — Progress Notes (Signed)
Advanced Heart Failure Clinic Note   PCP: Dr. [name] Penna HF Cardiologist: Dr. Haroldine Laws  HPI:  [name] is a 70 year old male with past medical history of hypertension, hyperlipidemia, VF arrest, CAD, and systolic heart failure.  He was admitted 4/22 with STEMI and cardiac arrest prior to coming to hospital.  He underwent emergent cardiac catheterization and PCI with stent to proximal RCA. EF 25 to 30%.  Patient did have prolonged ICU stay for cardiogenic/hemorrhagic shock requiring vasopressors, Impella and transfusion.  He suffered from diminished mental status and MRI showed scattered punctate infarction concerning for anoxic injury.  He had AKI requiring CRRT, then transitioned to [facility].  Palliative care was consulted and made DNR.  He was discharged to home on oxygen with Home Health, discharge weight 175.5 lbs.  Today he returns for HF follow up. Last visit he had stable NYHA II symptoms and Wilder Glade was started. Overall feeling fine. He had a couple episodes of chest "twinges" relieved by SL Nitro. Thinks he may be over-reacting. Denies increasing SOB, CP, dizziness, edema, or PND/Orthopnea. He has finished PT/OT/SLP therapy. Appetite ok. No fever or chills. Weight at home 176 pounds. Taking all medications. Has Rx for sildenafil and uses once a week, has not used any since MI. Has not needed any lasix.  Cardiac Studies: - Echo (4/22): EF 25-30%, severe LV dysfunction, RV moderately reduced  - LHC (4/22): Ost LAD to Prox LAD lesion is 50% stenosed. Mid LAD lesion is 20% stenosed. Prox RCA-1 lesion is 100% stenosed, s/p PCI+DES Prox RCA-2 lesion is 50% stenosed.  - RHC (4/22): On NE and Impella RA = 4 RV = 19/3 PA = 20/10 (11) PCW = 7 Fick cardiac output/index = 2.6/1.7 FA sat = 100% PA sat = 54%  ROS: All systems negative except as listed in HPI, PMH and Problem List.  SH:  Social History   Socioeconomic History   Marital status: Single    Spouse name: Not on  file   Number of children: Not on file   Years of education: Not on file   Highest education level: Not on file  Occupational History   Occupation: retired    Comment: Pharmacist, hospital  Tobacco Use   Smoking status: Never   Smokeless tobacco: Never  Vaping Use   Vaping Use: Never used  Substance and Sexual Activity   Alcohol use: No    Alcohol/week: 0.0 standard drinks   Drug use: No   Sexual activity: Not on file  Other Topics Concern   Not on file  Social History Narrative   Not on file   Social Determinants of Health   Financial Resource Strain: Low Risk    Difficulty of Paying Living Expenses: Not hard at all  Food Insecurity: No Food Insecurity   Worried About Charity fundraiser in the Last Year: Never true   [place] in the Last Year: Never true  Transportation Needs: No Transportation Needs   Lack of Transportation (Medical): No   Lack of Transportation (Non-Medical): No  Physical Activity: Sufficiently Active   Days of Exercise per Week: 7 days   Minutes of Exercise per Session: 30 min  Stress: No Stress Concern Present   Feeling of Stress : Not at all  Social Connections: Socially Isolated   Frequency of Communication with Friends and Family: More than three times a week   Frequency of Social Gatherings with Friends and Family: More than three times a week  Attends Religious Services: Never   Active Member of Clubs or Organizations: No   Attends Archivist Meetings: Never   Marital Status: Never married  Human resources officer Violence: Not At Risk   Fear of Current or Ex-Partner: No   Emotionally Abused: No   Physically Abused: No   Sexually Abused: No   FH:  Family History  Problem Relation Age of Onset   Hypertension Mother    Hyperlipidemia Mother    Depression Other    Diabetes Other    Hyperlipidemia Other    Hypertension Other    Alzheimer's disease Other    Colon cancer Neg Hx    Past Medical History:  Diagnosis Date   Anxiety     Arthritis    Cancer ([place]) 2010   basal cell ca, head   Cataract    bilateral   Depression    ED (erectile dysfunction)    GERD (gastroesophageal reflux disease)    Hyperlipidemia    Hypertension    Kidney stone 07-29-11   passed    Neuromuscular disorder ([place])    neuropathy legs   Vitreous floater    left eye, sees Dr. Dawna Part at [facility]    Current Outpatient Medications  Medication Sig Dispense Refill   aspirin 81 MG chewable tablet Chew 1 tablet (81 mg total) by mouth daily. 90 tablet 0   carvedilol (COREG) 3.125 MG tablet Take 1 tablet (3.125 mg total) by mouth 2 (two) times daily with a meal. 180 tablet 1   dapagliflozin propanediol (FARXIGA) 10 MG TABS tablet Take 1 tablet (10 mg total) by mouth daily before breakfast. 30 tablet 11   finasteride ([name]) 5 MG tablet Take 1 tablet (5 mg total) by mouth daily. 90 tablet 0   furosemide (LASIX) 40 MG tablet Take 1 tablet (40 mg total) by mouth daily as needed for fluid or edema (weight > 180 lb). 30 tablet 0   hydrALAZINE (APRESOLINE) 25 MG tablet Take 1 tablet (25 mg total) by mouth daily. 90 tablet 0   isosorbide mononitrate (IMDUR) 30 MG 24 hr tablet Take 1 tablet (30 mg total) by mouth daily. 90 tablet 0   ketoconazole (NIZORAL) 2 % cream Apply 1 application topically daily as needed for irritation.     LORazepam (ATIVAN) 1 MG tablet Take 1 tablet (1 mg total) by mouth every 6 (six) hours as needed for anxiety. 90 tablet 1   LORazepam (ATIVAN) 2 MG tablet TAKE 1 TABLET 6 HOURS AS NEEDED FOR ANXIETY. (DOSE CHANGE, FILL 10/14/20 BASED ON PREV PICKUP) 90 tablet 5   losartan (COZAAR) 25 MG tablet TAKE 1 TABLET (25 MG TOTAL) BY MOUTH DAILY. 90 tablet 0   nitroGLYCERIN (NITROSTAT) 0.4 MG SL tablet Place 1 tablet (0.4 mg total) under the tongue every 5 (five) minutes as needed for chest pain. 50 tablet 3   omeprazole (PRILOSEC) 20 MG capsule Take 1 capsule (20 mg total) by mouth as needed. 30 capsule 0   potassium chloride SA  (KLOR-CON) 20 MEQ tablet Take 1 tablet (20 mEq total) by mouth daily as needed. - Take only when taking Lasix 30 tablet 0   rosuvastatin (CRESTOR) 10 MG tablet Take 1 tablet (10 mg total) by mouth daily. 90 tablet 0   spironolactone (ALDACTONE) 25 MG tablet Take 0.5 tablets (12.5 mg total) by mouth daily. 15 tablet 5   ticagrelor (BRILINTA) 90 MG TABS tablet Place 1 tablet (90 mg total) by mouth twice daily. [place]  tablet 0   triamcinolone cream (KENALOG) 0.1 % Apply 1 application topically 2 (two) times daily as needed.     No current facility-administered medications for this encounter.   BP 108/70   Pulse 64   Wt 81.7 kg (180 lb 3.2 oz)   SpO2 98%   BMI 23.77 kg/m   Wt Readings from Last 3 Encounters:  04/16/21 81.7 kg (180 lb 3.2 oz)  03/12/21 80.3 kg (177 lb)  03/05/21 81 kg (178 lb 9.6 oz)   PHYSICAL EXAM: General:  NAD. No resp difficulty HEENT: Normal Neck: Supple. No JVD. Carotids 2+ bilat; no bruits. No lymphadenopathy or thryomegaly appreciated. Cor: PMI nondisplaced. Regular rate & rhythm. No rubs, gallops or murmurs. Lungs: Clear Abdomen: Soft, nontender, nondistended. No hepatosplenomegaly. No bruits or masses. Good bowel sounds. Extremities: No cyanosis, clubbing, rash, edema Neuro: Alert & oriented x 3, cranial nerves grossly intact. Moves all 4 extremities w/o difficulty. Affect pleasant. He has a slight head tremor.  ECG (personally reviewed): SR no acute changes from previous tracing.  ASSESSMENT & PLAN:   1. CAD - VF arrest in setting of acute MI. Emergent cath (4/22) w/ pRCA occlusion s/p PCI + DES. - Recent nitro use for CP. ECG re-assuring. Patient feels it's anxiety-related. Counseled him on warning signs of when to seek emergency care. - Continue ASA 81 mg daily. - Continue Brilinta 90 mg bid. - Continue Crestor 10 mg daily.   2. Systolic Heart Failure  - ICM. Echo (4/22) LVEF 20-25%, RV moderately reduced. - NYHA II, euvolemic on exam, weight  stable. - Stop losartan. - Start Entresto 24/26 mg bid. - Continue Farxiga 10 mg daily. - Continue spiro 12.5 mg daily. - Continue hydralazine 25 mg tid. - Continue Imdur 30 mg daily (no sildenafil with Imdur or Nitro use). - Continue carvedilol 3.125 mg bid. - Continue Lasix 40 mg daily + KCL 20 mg daily for weight > 180 lbs only (PRN only). - BMET today, repeat in 10 days.   3. CKD IIIb - Baseline SCr ~1.75 - BMET today.   4. Anoxic brain injury - Improved. - He has completed HH PT/OT/SLP. - PCP has cleared him to drive.  Follow up in 6-8 weeks with Dr. Haroldine Laws w/ echo.  Allena Katz, FNP-BC 04/16/21

## 2021-04-18 ENCOUNTER — Ambulatory Visit: Payer: Medicare PPO | Admitting: Family Medicine

## 2021-04-22 ENCOUNTER — Other Ambulatory Visit: Payer: Self-pay

## 2021-04-23 ENCOUNTER — Ambulatory Visit: Payer: Medicare PPO | Admitting: Family Medicine

## 2021-04-23 ENCOUNTER — Encounter: Payer: Self-pay | Admitting: Family Medicine

## 2021-04-23 VITALS — BP 108/68 | HR 71 | Temp 98.1°F | Wt 178.4 lb

## 2021-04-23 DIAGNOSIS — N170 Acute kidney failure with tubular necrosis: Secondary | ICD-10-CM | POA: Diagnosis not present

## 2021-04-23 DIAGNOSIS — G9340 Encephalopathy, unspecified: Secondary | ICD-10-CM | POA: Diagnosis not present

## 2021-04-23 DIAGNOSIS — I10 Essential (primary) hypertension: Secondary | ICD-10-CM

## 2021-04-23 DIAGNOSIS — F411 Generalized anxiety disorder: Secondary | ICD-10-CM | POA: Diagnosis not present

## 2021-04-23 DIAGNOSIS — J9601 Acute respiratory failure with hypoxia: Secondary | ICD-10-CM

## 2021-04-23 NOTE — Progress Notes (Signed)
   Subjective:    Patient ID: [name], male    DOB: November 08, 1950, 70 y.o.   MRN: [id number]  HPI Here to follow up on a recent STEMI, respiratory and renal and heart failure, along with encephalopathy. He continues to steadily improve, and his energy level is coming back . He now drives short distances, and he drove himself here today. No chest pain or SOB. He saw Cardiology on 04-16-21, and he was switched from Losartan to [facility]. His BMET that day showed a slight improvement in renal function (creatinine was up to 1.88 and GFR was up to 38). We had stopped Duloxetine and switched him to Lorazepam for his anxiety, and this has worked well. He averages taking this TID. Sleep and appetite are good. He asks about one episode of seeing brown in his semen recently. No pain in the genitals or when urinating.    Review of Systems  Constitutional: Negative.   Respiratory: Negative.    Cardiovascular: Negative.   Gastrointestinal: Negative.   Genitourinary:  Negative for difficulty urinating, dysuria, flank pain, frequency and testicular pain.  Neurological: Negative.   Psychiatric/Behavioral:  Negative for agitation, dysphoric mood and hallucinations. The patient is nervous/anxious.       Objective:   Physical Exam Constitutional:      Appearance: Normal appearance. He is not ill-appearing.  Cardiovascular:     Rate and Rhythm: Normal rate and regular rhythm.     Pulses: Normal pulses.     Heart sounds: Normal heart sounds.  Pulmonary:     Effort: Pulmonary effort is normal.     Breath sounds: Normal breath sounds.  Neurological:     General: No focal deficit present.     Mental Status: He is alert and oriented to person, place, and time. Mental status is at baseline.  Psychiatric:        Mood and Affect: Mood normal.        Behavior: Behavior normal.        Thought Content: Thought content normal.          Assessment & Plan:  He is recovering well from a STEMI with  resultant cardiac and renal and respiratory failure. His mental status seems to be back to baseline. His anxiety is well controlled. He had an episode of hematospermia, and I reassured him this was the result of taking ASA and Brilinta. He will let us know if he sees any more signs of bleeding. We will see him back next month for a well exam. We spent 35 minutes reviewing records and discussing these issues.  [name] Penna, MD

## 2021-04-26 ENCOUNTER — Other Ambulatory Visit: Payer: Self-pay

## 2021-04-26 ENCOUNTER — Ambulatory Visit (HOSPITAL_COMMUNITY)
Admission: RE | Admit: 2021-04-26 | Discharge: 2021-04-26 | Disposition: A | Discharge status: Home / Self Care | Payer: Medicare PPO | Source: Ambulatory Visit | Attending: Cardiology | Admitting: Cardiology

## 2021-04-26 DIAGNOSIS — I5022 Chronic systolic (congestive) heart failure: Secondary | ICD-10-CM | POA: Insufficient documentation

## 2021-04-26 LAB — BASIC METABOLIC PANEL
Anion gap: 10 (ref 5–15)
BUN: 44 mg/dL — ABNORMAL HIGH (ref 8–23)
CO2: 22 mmol/L (ref 22–32)
Calcium: 9.3 mg/dL (ref 8.9–10.3)
Chloride: 105 mmol/L (ref 98–111)
Creatinine, Ser: 2.05 mg/dL — ABNORMAL HIGH (ref 0.61–1.24)
GFR, Estimated: 34 mL/min — ABNORMAL LOW (ref >60)
Glucose, Bld: 86 mg/dL (ref 70–99)
Potassium: 4.8 mmol/L (ref 3.5–5.1)
Sodium: 137 mmol/L (ref 135–145)

## 2021-05-03 ENCOUNTER — Ambulatory Visit (INDEPENDENT_AMBULATORY_CARE_PROVIDER_SITE_OTHER): Payer: Medicare PPO

## 2021-05-03 DIAGNOSIS — I10 Essential (primary) hypertension: Secondary | ICD-10-CM | POA: Diagnosis not present

## 2021-05-03 DIAGNOSIS — I2510 Atherosclerotic heart disease of native coronary artery without angina pectoris: Secondary | ICD-10-CM

## 2021-05-03 DIAGNOSIS — E782 Mixed hyperlipidemia: Secondary | ICD-10-CM | POA: Diagnosis not present

## 2021-05-03 DIAGNOSIS — R570 Cardiogenic shock: Secondary | ICD-10-CM

## 2021-05-03 NOTE — Patient Instructions (Signed)
Visit Information  PATIENT GOALS:  Goals Addressed             This Visit's Progress    RNCM:Track and Manage My Blood Pressure-Hypertension   On track    Timeframe:  Long-Range Goal Priority:  Medium Start Date:  02/26/21                           Expected End Date:      08/22/21                 Follow Up Date 06/21/21/22    - check blood pressure daily - choose a place to take my blood pressure (home, clinic or office, retail store) - write blood pressure results in a log or diary   -Take B/P medications as ordered -Plan to follow a low salt diet  -Increase activity as tolerated Why is this important?   You won't feel high blood pressure, but it can still hurt your blood vessels.  High blood pressure can cause heart or kidney problems. It can also cause a stroke.  Making lifestyle changes like losing a little weight or eating less salt will help.  Checking your blood pressure at home and at different times of the day can help to control blood pressure.  If the doctor prescribes medicine remember to take it the way the doctor ordered.  Call the office if you cannot afford the medicine or if there are questions about it.     Notes:      RNCM:Track and Manage Symptoms-Heart Failure   On track    Timeframe:  Long-Range Goal Priority:  High Start Date:       02/26/21                      Expected End Date:       08/22/21                Follow Up Date 06/21/21    - begin a heart failure diary - develop a rescue plan - eat more whole grains, fruits and vegetables, lean meats and healthy fats - follow rescue plan if symptoms flare-up - know when to call the doctor - track symptoms and what helps feel better or worse - dress right for the weather, hot or cold    Why is this important?   You will be able to handle your symptoms better if you keep track of them.  Making some simple changes to your lifestyle will help.  Eating healthy is one thing you can do to take good care of  yourself.    Notes:         Patient verbalizes understanding of instructions provided today and agrees to view in [place].   Telephone follow up appointment with care management team member scheduled for: 06/21/21  Peter Garter RN, [facility] CDE Care Management Coordinator [place] [phone], Mobile [phone]

## 2021-05-03 NOTE — Chronic Care Management (AMB) (Signed)
Chronic Care Management   CCM RN Visit Note  05/03/2021 Name: [name] MRN: [id number] DOB: May 14, 1951  Subjective: [name] is a 70 y.o. year old male who is a primary care patient of Dalton Morale, MD. The care management team was consulted for assistance with disease management and care coordination needs.    Engaged with patient by telephone for follow up visit in response to provider referral for case management and/or care coordination services.   Consent to Services:  The patient was given information about Chronic Care Management services, agreed to services, and gave verbal consent prior to initiation of services.  Please see initial visit note for detailed documentation.   Patient agreed to services and verbal consent obtained.   Assessment: Review of patient past medical history, allergies, medications, health status, including review of consultants reports, laboratory and other test data, was performed as part of comprehensive evaluation and provision of chronic care management services.   SDOH (Social Determinants of Health) assessments and interventions performed:    CCM Care Plan  Allergies  Allergen Reactions   Codeine Other (See Comments)    hallucinations   Lisinopril Cough    Outpatient Encounter Medications as of 05/03/2021  Medication Sig   aspirin 81 MG chewable tablet Chew 1 tablet (81 mg total) by mouth daily.   carvedilol (COREG) 3.125 MG tablet Take 1 tablet (3.125 mg total) by mouth 2 (two) times daily with a meal.   dapagliflozin propanediol (FARXIGA) 10 MG TABS tablet Take 1 tablet (10 mg total) by mouth daily before breakfast.   finasteride ([name]) 5 MG tablet Take 1 tablet (5 mg total) by mouth daily.   furosemide (LASIX) 40 MG tablet Take 1 tablet (40 mg total) by mouth daily as needed for fluid or edema (weight > 180 lb).   hydrALAZINE (APRESOLINE) 25 MG tablet Take 1 tablet (25 mg total) by mouth daily.   isosorbide mononitrate  (IMDUR) 30 MG 24 hr tablet Take 1 tablet (30 mg total) by mouth daily.   ketoconazole (NIZORAL) 2 % cream Apply 1 application topically daily as needed for irritation.   LORazepam (ATIVAN) 2 MG tablet TAKE 1 TABLET 6 HOURS AS NEEDED FOR ANXIETY. (DOSE CHANGE, FILL 10/14/20 BASED ON PREV PICKUP)   nitroGLYCERIN (NITROSTAT) 0.4 MG SL tablet Place 1 tablet (0.4 mg total) under the tongue every 5 (five) minutes as needed for chest pain.   omeprazole (PRILOSEC) 20 MG capsule Take 1 capsule (20 mg total) by mouth as needed.   potassium chloride SA (KLOR-CON) 20 MEQ tablet Take 1 tablet (20 mEq total) by mouth daily as needed. - Take only when taking Lasix   rosuvastatin (CRESTOR) 10 MG tablet Take 1 tablet (10 mg total) by mouth daily.   sacubitril-valsartan (ENTRESTO) 24-26 MG Take 1 tablet by mouth 2 (two) times daily.   spironolactone (ALDACTONE) 25 MG tablet Take 0.5 tablets (12.5 mg total) by mouth daily.   ticagrelor (BRILINTA) 90 MG TABS tablet Place 1 tablet (90 mg total) by mouth twice daily.   triamcinolone cream (KENALOG) 0.1 % Apply 1 application topically 2 (two) times daily as needed.   [DISCONTINUED] tadalafil (CIALIS) 20 MG tablet Take 20 mg by mouth daily as needed.     No facility-administered encounter medications on file as of 05/03/2021.    Patient Active Problem List   Diagnosis Date Noted   Hypertension    Hemoptysis    Acute hypoxemic respiratory failure (HCC)    Acute renal failure (  [place])    Encephalopathy acute    Cardiogenic shock ([place]) 01/03/2021   Atypical chest pain 10/25/2019   Hyperglycemia 10/12/2019   Coronary artery calcification seen on CAT scan 06/15/2019   Nodule of lower lobe of left lung 06/15/2019   Tremor of right hand 04/24/2017   Numbness and tingling of both legs 04/24/2017   Bilateral leg edema 06/05/2015   Chronic neck pain 06/05/2015   Plantar fasciitis 02/01/2014   Visual floaters 04/04/2013   Subjective vision disturbance, left 04/04/2013    BPH with urinary obstruction 01/01/2010   MELANOMA, HX OF 06/01/2009   NECK PAIN 09/11/2008   CELLULITIS AND ABSCESS OF LEG EXCEPT FOOT 04/26/2008   ERECTILE DYSFUNCTION 10/12/2007   Hyperlipemia, mixed 04/12/2007   Anxiety state 04/12/2007   Essential hypertension 04/12/2007   GERD 04/12/2007    Conditions to be addressed/monitored:CHF, CAD, HTN, and HLD  Care Plan : RNCM:Cardiovascular Disease Management(HF, CAD, HTN, HLD)  Updates made by Dalton Ped, RN since 05/03/2021 12:00 AM     Problem: Lack of long term management of Cardiovascular Disease Management(HF, CAD, HTN, HLD)   Priority: High     Long-Range Goal: Effective self managment of Cardiovascular Disease(HF, CAD, HTN, HLD)   Start Date: 02/26/2021  Expected End Date: 08/22/2021  This Visit's Progress: On track  Recent Progress: On track  Priority: High  Note:   Current Barriers:  Knowledge deficits related to basic heart failure pathophysiology and self care management, hx of CAD,HTN,HLD Unable to independently self management of Cardiovascular Disease (HF, CAD, HTN, HLD) Unable to perform IADLs independently Pt hospitalized from 01/03/21-02/07/21 with cardiogenic shock and STEMI. Pt lives alone but has a very supportive network of friends and neighbors who have been assisting him with meals, shopping and transportation.-  States he went to the heart failure clinic and they started him on [place].  States his lab check after starting the [name] showed it was effecting his kidney function.  States he had to hold it and his spirolactone for 2 days.  States he does not like how the Gross makes him urinate more especially at night.   States he does his exercises daily.  States he does have some shortness of breath when he is doing his exercise or household chores.  Denies any chest pains.States he is weighing, checking B/P and O2 sat daily.  States his weight has been stable between 174-176.  B/P was 120/67 today.   States he is keeping a log of his readings and he did get the calendar book RNCM sent him.  States he has been following a low sodium diet and trying to eat more vegetables and fruits. States he getting his medications delivered from [facility] without issue Nurse Case Manager Clinical Goal(s):  patient will weigh self daily and record patient will verbalize understanding of Heart Failure Action Plan and when to call doctor patient will take all Heart Failure mediations as prescribed Interventions:  Collaboration with Dalton Morale, MD regarding development and update of comprehensive plan of care as evidenced by provider attestation and co-signature Inter-disciplinary care team collaboration (see longitudinal plan of care) Reviewed upcoming provider visits-Heart and vascular 06/10/21, Dr, Sarajane Jews 05/30/21, CCM-pharmacist 06/06/21 Reviewed basic overview and discussion of pathophysiology of Heart Failure, HTN,HLD Reinforced to follow a low sodium diet Reinforced Heart Failure Action Plan Reinforced importance of daily weight Reinforced role of diuretics in prevention of fluid overload Reinforced how to use NTG tablets as needed and when to call 911 Encouraged  to discuss with heart failure clinic about use of Entresto Reviewed to progress his activity as tolerated and to avoid extremes in temperature   Self-Care Activities:  Takes Heart Failure Medications as prescribed Weighs daily and record (notifying MD of 3 lb weight gain over night or 5 lb in a week) Verbalizes understanding of and follows CHF Action Plan Adheres to low sodium diet  Patient Goals:  - Take Heart Failure Medications as prescribed - Weigh daily and record (notify MD with 3 lb weight gain over night or 5 lb in a week) - Follow CHF Action Plan - Adhere to low sodium diet - begin a heart failure diary - bring diary to all appointments - eat more whole grains, fruits and vegetables, lean meats and healthy fats - follow rescue plan  if symptoms flare-up - know when to call the doctor - track symptoms and what helps feel better or worse - dress right for the weather, hot or cold - follow activity or exercise plan - check blood pressure daily - choose a place to take my blood pressure (home, clinic or office, retail store) - write blood pressure results in a log or diary   -Take B/P medications as ordered -Plan to follow a low salt diet  -Increase activity as tolerated  Follow Up Plan: Telephone follow up appointment with care management team member scheduled for: 06/21/21 at 10 AM The patient has been provided with contact information for the care management team and has been advised to call with any health related questions or concerns.       Plan:Telephone follow up appointment with care management team member scheduled for:  06/21/21 and The patient has been provided with contact information for the care management team and has been advised to call with any health related questions or concerns.  Dalton Garter RN, [facility], CDE Care Management Coordinator [facility] [phone], Mobile [phone]

## 2021-05-08 ENCOUNTER — Telehealth (HOSPITAL_COMMUNITY): Payer: Self-pay

## 2021-05-08 NOTE — Telephone Encounter (Signed)
Called patient to see if he was interested in participating in the Cardiac Rehab Program. Patient stated yes. Patient will come in for orientation on 06/13/2021'@10'$ :00am and will attend the 1:15pm exercise class.   Tourist information centre manager.

## 2021-05-09 ENCOUNTER — Other Ambulatory Visit (HOSPITAL_COMMUNITY): Payer: Medicare PPO

## 2021-05-09 ENCOUNTER — Telehealth (HOSPITAL_COMMUNITY): Payer: Self-pay | Admitting: *Deleted

## 2021-05-09 NOTE — Telephone Encounter (Addendum)
Pts dentist office called to see if there were any precautions pt needed to take before having a cleaning or extraction. Any antibiotics pt needs to take? Hold any meds? Any sedation limitations?  Routed to [place] for advice  Call back # [phone]

## 2021-05-10 ENCOUNTER — Other Ambulatory Visit (HOSPITAL_BASED_OUTPATIENT_CLINIC_OR_DEPARTMENT_OTHER): Payer: Self-pay

## 2021-05-10 ENCOUNTER — Ambulatory Visit (HOSPITAL_COMMUNITY)
Admission: RE | Admit: 2021-05-10 | Discharge: 2021-05-10 | Disposition: A | Discharge status: Home / Self Care | Payer: Medicare PPO | Source: Ambulatory Visit | Attending: Internal Medicine | Admitting: Internal Medicine

## 2021-05-10 ENCOUNTER — Other Ambulatory Visit: Payer: Self-pay

## 2021-05-10 ENCOUNTER — Other Ambulatory Visit (HOSPITAL_COMMUNITY): Payer: Self-pay | Admitting: Internal Medicine

## 2021-05-10 ENCOUNTER — Ambulatory Visit: Payer: Medicare PPO | Attending: Internal Medicine

## 2021-05-10 DIAGNOSIS — I10 Essential (primary) hypertension: Secondary | ICD-10-CM

## 2021-05-10 DIAGNOSIS — Z23 Encounter for immunization: Secondary | ICD-10-CM

## 2021-05-10 LAB — BASIC METABOLIC PANEL
Anion gap: 7 (ref 5–15)
BUN: 33 mg/dL — ABNORMAL HIGH (ref 8–23)
CO2: 22 mmol/L (ref 22–32)
Calcium: 9.1 mg/dL (ref 8.9–10.3)
Chloride: 106 mmol/L (ref 98–111)
Creatinine, Ser: 1.92 mg/dL — ABNORMAL HIGH (ref 0.61–1.24)
GFR, Estimated: 37 mL/min — ABNORMAL LOW (ref >60)
Glucose, Bld: 101 mg/dL — ABNORMAL HIGH (ref 70–99)
Potassium: 4.8 mmol/L (ref 3.5–5.1)
Sodium: 135 mmol/L (ref 135–145)

## 2021-05-10 MED ORDER — PFIZER-BIONT COVID-19 VAC-TRIS 30 MCG/0.3ML IM SUSP
INTRAMUSCULAR | 0 refills | Status: DC
  Filled 2021-05-10: qty 0.3, 1d supply, fill #0

## 2021-05-10 NOTE — Telephone Encounter (Signed)
Left detailed vm for [name].

## 2021-05-10 NOTE — Progress Notes (Signed)
   Covid-19 Vaccination Clinic  Name:  [name]    MRN: [id number] DOB: 06-20-1951  05/10/2021  Mr. Siegert was observed post Covid-19 immunization for 15 minutes without incident. He was provided with Vaccine Information Sheet and instruction to access the V-Safe system.   Mr. Mcaden was instructed to call 911 with any severe reactions post vaccine: Difficulty breathing  Swelling of face and throat  A fast heartbeat  A bad rash all over body  Dizziness and weakness   Immunizations Administered     Name Date Dose VIS Date Route   PFIZER Comrnaty(Gray TOP) Covid-19 Vaccine 05/10/2021  2:24 PM 0.3 mL 08/30/2020 Intramuscular   Manufacturer: [place]   Lot: [id number]   NDC: [phone]

## 2021-05-13 ENCOUNTER — Ambulatory Visit: Payer: Medicare PPO

## 2021-05-14 ENCOUNTER — Other Ambulatory Visit: Payer: Self-pay | Admitting: Family Medicine

## 2021-05-15 ENCOUNTER — Telehealth: Payer: Self-pay | Admitting: Pharmacist

## 2021-05-15 NOTE — Chronic Care Management (AMB) (Signed)
Chronic Care Management Pharmacy Assistant   Name: [name]  MRN: [id number] DOB: April 07, 1951   Reason for Encounter: Medication Review Medication Coordination Call   Recent office visits:  04-23-2021 [name], MD - Patient presented for Encephalopathy acute and other concerns. Increased Lorazepam to 2 mg one tab every 6 Hours PRN.  Recent consult visits:  04-16-2021 Rafael Bihari, FNP ([facility]) - Patient presented for CHF follow-up. Prescribed Sacubitril-Valsartan 24-26 mg.   Hospital visits:  Admitted to the hospital on 04//14/2022 due to Acute Hypoxemic respiratory failure. Discharge date was 02/07/2021. Discharged from [facility]?Medications Started at [facility] Discharge:?? -started the following mediation Carvedilol (COREG) 3.125 mg one tablet by mouth two times daily meals Aspirin 81 mg: take one table daily Furosemide (Lasix) 40 mg: take one tablet as needed for fluid or edema Hydralazine (APRESOLINE)25 mg: take one tablet every 8 hours Isosorbide mononitrate (IMDUR) 30 mg 24 hr: one tablet by mouth daily [name] (PROVIGIL) 100 mg: one tablet daily Potassium Chloride ER 20 MEQ: take one tablet daily as needed. Only when taking laxis Spironolactone (ALDACTONE) 25 mg: take 0.5 tablets daily Ticagrelor (BRILINTA): 90 tablets: place one tablet into feeding tube twice daily   Medication Changes at Hospital Discharge: -Changed  Losartan (COZAAR) 25 mg: one tablet daily Rosuvastatin (CRESTOR) 10 mg: take one table daily   Medications Discontinued at Hospital Discharge: -Stopped the following medication Acetaminophen 325 MG tablet (TYLENOL) Amlodipine 5 MG tablet (NORVASC) Cyclobenzaprine 10 MG tablet (FLEXERIL) Duloxetine 20 MG capsule (CYMBALTA) Finasteride 5 MG tablet ([name]) Fluad 0.5 ML Susy Lorazepam 2 MG tablet (ATIVAN) Magic mouthwash solution Melatonin 10 MG Tabs Omeprazole 20 MG capsule  (PRILOSEC) Tamsulosin 0.4 MG Caps capsule (FLOMAX) Triamcinolone cream 0.1 % (KENALOG)   Medications that remain the same after Hospital Discharge:??  -All other medications will remain the same.      Medications: Outpatient Encounter Medications as of 05/15/2021  Medication Sig   aspirin 81 MG chewable tablet Chew 1 tablet (81 mg total) by mouth daily.   carvedilol (COREG) 3.125 MG tablet Take 1 tablet (3.125 mg total) by mouth 2 (two) times daily with a meal.   COVID-19 mRNA Vac-TriS, Pfizer, (PFIZER-BIONT COVID-19 VAC-TRIS) SUSP injection Inject into the muscle.   dapagliflozin propanediol (FARXIGA) 10 MG TABS tablet Take 1 tablet (10 mg total) by mouth daily before breakfast.   finasteride ([name]) 5 MG tablet Take 1 tablet (5 mg total) by mouth daily.   furosemide (LASIX) 40 MG tablet Take 1 tablet (40 mg total) by mouth daily as needed for fluid or edema (weight > 180 lb).   hydrALAZINE (APRESOLINE) 25 MG tablet Take 1 tablet (25 mg total) by mouth daily.   isosorbide mononitrate (IMDUR) 30 MG 24 hr tablet Take 1 tablet (30 mg total) by mouth daily.   ketoconazole (NIZORAL) 2 % cream Apply 1 application topically daily as needed for irritation.   LORazepam (ATIVAN) 2 MG tablet TAKE 1 TABLET 6 HOURS AS NEEDED FOR ANXIETY. (DOSE CHANGE, FILL 10/14/20 BASED ON PREV PICKUP)   nitroGLYCERIN (NITROSTAT) 0.4 MG SL tablet Place 1 tablet (0.4 mg total) under the tongue every 5 (five) minutes as needed for chest pain.   omeprazole (PRILOSEC) 20 MG capsule Take 1 capsule (20 mg total) by mouth as needed.   potassium chloride SA (KLOR-CON) 20 MEQ tablet Take 1 tablet (20 mEq total) by mouth daily as needed. - Take only  when taking Lasix   rosuvastatin (CRESTOR) 10 MG tablet Take 1 tablet (10 mg total) by mouth daily.   sacubitril-valsartan (ENTRESTO) 24-26 MG Take 1 tablet by mouth 2 (two) times daily.   spironolactone (ALDACTONE) 25 MG tablet Take 0.5 tablets (12.5 mg total) by mouth daily.    ticagrelor (BRILINTA) 90 MG TABS tablet Place 1 tablet (90 mg total) by mouth twice daily.   triamcinolone cream (KENALOG) 0.1 % Apply 1 application topically 2 (two) times daily as needed.   [DISCONTINUED] tadalafil (CIALIS) 20 MG tablet Take 20 mg by mouth daily as needed.     No facility-administered encounter medications on file as of 05/15/2021.  Reviewed chart for medication changes ahead of medication coordination call.  No OVs, Consults, or hospital visits since last care coordination call/Pharmacist visit.   Medication changes indicated : Prescribed Sacubitril-Valsartan 24-26 mg.  Increased Lorazepam to 2 mg one tab every 6 Hours PRN BP Readings from Last 3 Encounters:  04/23/21 108/68  04/16/21 108/70  03/12/21 108/68    Lab Results  Component Value Date   HGBA1C 5.7 (H) 01/06/2021     Patient obtains medications through Adherence Packaging  30 Days   Last adherence delivery included:  Hydralazine (APRESOLINE) 25 mg: one tablet at breakfast Isosorbide mononitrate (IMDUR) 30 mg 24 hour: one tablet at breakfast Carvedilol (COREG) 3.125 mg: one tablet at breakfast and one tablet at dinner Rosuvastatin (CRESTOR) 10 mg: one tablet at breakfast Spironolactone (ALDACTONE) 25 mg: take (0.5) half tablet at breakfast Ticagrelor (BRILINTA) 90 mg: one tablet at breakfast and one tablet at dinner Finasteride ([name]) 5 mg: one tablet before breakfast Dapagliflozin propanediol (FARXIGA) 10 mg: one tablet before breakfast    Coordinated acute fill for the following medication to be delivered 07.27.2022 for 90 day supply Sacubitril-valsartan (ENTRESTO) 24-26 mg: one tablet at breakfast and one a dinner (Please keep this medication in 90 day supply vials- It is more cost effective for 90 days.)    Patient declined the following medications due to cost Aspirin 81 MG chewable tablet.( Patient will but OTC) Omeprazole (PRILOSEC) 20 mg: one capsule breakfast ( patient takes  PRN)  Patient is due for next adherence delivery on: 05-24-21. Called patient and reviewed medications and coordinated delivery.  This delivery to include: Isosorbide mononitrate (IMDUR) 30 mg 24 hour: one tablet at breakfast Dapagliflozin propanediol (FARXIGA) 10 mg: one tablet before breakfast Spironolactone (ALDACTONE) 25 mg: take (0.5) half tablet at breakfast Carvedilol (COREG) 3.125 mg: one tablet at breakfast and one tablet at dinner Rosuvastatin (CRESTOR) 10 mg: one tablet at breakfast Hydralazine (APRESOLINE) 25 mg: one tablet at breakfast Finasteride ([name]) 5 mg: one tablet before breakfast Ticagrelor (BRILINTA) 90 mg: one tablet at breakfast and one tablet at dinner     Patient noted that he called the pharmacy this morning and coordinated a fill of his Omeprazole  To be delivered to him today 05-15-21 Between 3-6p  Patient declined the following medications (meds) due to (reason) Aspirin 81 MG chewable tablet.( Patient will buy OTC) Sacubitril-valsartan (ENTRESTO) 24-26 mg: one tablet at breakfast and one a dinner       90 day supply vials- It is more cost effective for 90 days.) still has many tablets started taking on 04-16-21 and got a 90 DS but he is not taking it twice daily or everyday as prescribed will be following up with Dr next month about this medication.   Confirmed delivery date of 05-23-21, advised patient that pharmacy will  contact him the morning of delivery.   Notes: Patient requested we move it to the day before to be safe and requested a delivery btw 3-6 will notify pharmacy. He Confirmed packaging for 30 DS and Vial for 90 DS for Entresto but does not need at this time.  Care Gaps: Zoster Vaccine- Overdue Flu Vaccine - Overdue   Star Rating Drugs: Rosuvastatin 10 mg - Last filled 04-17-21 30 DS at [facility] Sacubitril-Valsartan 24-26 mg- Last filled 04-17-21 90 DS at [facility] Dapagliflozin-Propanediol 10 mg Last filled 04-19-21 30 DS at  [facility] Pharmacist Assistant [phone]

## 2021-05-17 ENCOUNTER — Other Ambulatory Visit: Payer: Self-pay | Admitting: Family Medicine

## 2021-05-29 ENCOUNTER — Other Ambulatory Visit: Payer: Self-pay

## 2021-05-30 ENCOUNTER — Encounter: Payer: Self-pay | Admitting: Family Medicine

## 2021-05-30 ENCOUNTER — Ambulatory Visit (INDEPENDENT_AMBULATORY_CARE_PROVIDER_SITE_OTHER): Payer: Medicare PPO | Admitting: Family Medicine

## 2021-05-30 VITALS — BP 110/74 | HR 59 | Temp 97.7°F | Ht 71.75 in | Wt 182.0 lb

## 2021-05-30 DIAGNOSIS — N183 Chronic kidney disease, stage 3 unspecified: Secondary | ICD-10-CM | POA: Insufficient documentation

## 2021-05-30 DIAGNOSIS — N1832 Chronic kidney disease, stage 3b: Secondary | ICD-10-CM | POA: Insufficient documentation

## 2021-05-30 DIAGNOSIS — Z0000 Encounter for general adult medical examination without abnormal findings: Secondary | ICD-10-CM | POA: Diagnosis not present

## 2021-05-30 DIAGNOSIS — Z23 Encounter for immunization: Secondary | ICD-10-CM

## 2021-05-30 DIAGNOSIS — E1122 Type 2 diabetes mellitus with diabetic chronic kidney disease: Secondary | ICD-10-CM

## 2021-05-30 LAB — CBC WITH DIFFERENTIAL/PLATELET
Basophils Absolute: 0 10*3/uL (ref 0.0–0.1)
Basophils Relative: 0.5 % (ref 0.0–3.0)
Eosinophils Absolute: 0.2 10*3/uL (ref 0.0–0.7)
Eosinophils Relative: 2.9 % (ref 0.0–5.0)
HCT: 37.7 % — ABNORMAL LOW (ref 39.0–52.0)
Hemoglobin: 12.7 g/dL — ABNORMAL LOW (ref 13.0–17.0)
Lymphocytes Relative: 20.8 % (ref 12.0–46.0)
Lymphs Abs: 1.4 10*3/uL (ref 0.7–4.0)
MCHC: 33.8 g/dL (ref 30.0–36.0)
MCV: 93.6 fl (ref 78.0–100.0)
Monocytes Absolute: 0.7 10*3/uL (ref 0.1–1.0)
Monocytes Relative: 10.3 % (ref 3.0–12.0)
Neutro Abs: 4.4 10*3/uL (ref 1.4–7.7)
Neutrophils Relative %: 65.5 % (ref 43.0–77.0)
Platelets: 150 10*3/uL (ref 150.0–400.0)
RBC: 4.02 Mil/uL — ABNORMAL LOW (ref 4.22–5.81)
RDW: 12.9 % (ref 11.5–15.5)
WBC: 6.7 10*3/uL (ref 4.0–10.5)

## 2021-05-30 LAB — LIPID PANEL
Cholesterol: 119 mg/dL (ref 0–200)
HDL: 45.1 mg/dL (ref >39.00)
LDL Cholesterol: 56 mg/dL (ref 0–99)
NonHDL: 73.81
Total CHOL/HDL Ratio: 3
Triglycerides: 90 mg/dL (ref 0.0–149.0)
VLDL: 18 mg/dL (ref 0.0–40.0)

## 2021-05-30 LAB — HEPATIC FUNCTION PANEL
ALT: 19 U/L (ref 0–53)
AST: 18 U/L (ref 0–37)
Albumin: 4.2 g/dL (ref 3.5–5.2)
Alkaline Phosphatase: 38 U/L — ABNORMAL LOW (ref 39–117)
Bilirubin, Direct: 0.1 mg/dL (ref 0.0–0.3)
Total Bilirubin: 0.5 mg/dL (ref 0.2–1.2)
Total Protein: 6.8 g/dL (ref 6.0–8.3)

## 2021-05-30 LAB — BASIC METABOLIC PANEL
BUN: 39 mg/dL — ABNORMAL HIGH (ref 6–23)
CO2: 25 mEq/L (ref 19–32)
Calcium: 9.2 mg/dL (ref 8.4–10.5)
Chloride: 103 mEq/L (ref 96–112)
Creatinine, Ser: 2.19 mg/dL — ABNORMAL HIGH (ref 0.40–1.50)
GFR: 29.72 mL/min — ABNORMAL LOW (ref >60.00)
Glucose, Bld: 99 mg/dL (ref 70–99)
Potassium: 5.1 mEq/L (ref 3.5–5.1)
Sodium: 136 mEq/L (ref 135–145)

## 2021-05-30 LAB — TSH: TSH: 1.99 u[IU]/mL (ref 0.35–5.50)

## 2021-05-30 LAB — PSA: PSA: 0.65 ng/mL (ref 0.10–4.00)

## 2021-05-30 LAB — HEMOGLOBIN A1C: Hgb A1c MFr Bld: 6 % (ref 4.6–6.5)

## 2021-05-30 MED ORDER — ISOSORBIDE MONONITRATE ER 30 MG PO TB24: 30.0000 mg | ORAL_TABLET | Freq: Every day | ORAL | 3 refills | Status: DC

## 2021-05-30 MED ORDER — ROSUVASTATIN CALCIUM 10 MG PO TABS: 10.0000 mg | ORAL_TABLET | Freq: Every day | ORAL | 3 refills | Status: DC

## 2021-05-30 MED ORDER — OMEPRAZOLE 20 MG PO CPDR: 20.0000 mg | DELAYED_RELEASE_CAPSULE | ORAL | 11 refills | Status: DC | PRN

## 2021-05-30 NOTE — Progress Notes (Signed)
Subjective:    Patient ID: [name], male    DOB: December 22, 1950, 70 y.o.   MRN: [id number]  HPI Here for a well exam. 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 He says his energy level has returned to normal. He takes walks with his dog. He will see Cardiology and will have an other ECHO in the next few weeks. He will see Dr. Junious Silk for a Urology exam in a few weeks. His BP has been stable at home. No SOB or chest pain. He was found to have type 2 diabetes in the hospital and he was started on Farxiga.    Review of Systems  Constitutional: Negative.   HENT: Negative.    Eyes: Negative.   Respiratory: Negative.    Cardiovascular: Negative.   Gastrointestinal: Negative.   Genitourinary: Negative.   Musculoskeletal: Negative.   Skin: Negative.   Neurological: Negative.   Psychiatric/Behavioral: Negative.        Objective:   Physical Exam Constitutional:      General: He is not in acute distress.    Appearance: Normal appearance. He is well-developed. He is not diaphoretic.  HENT:     Head: Normocephalic and atraumatic.     Right Ear: External ear normal.     Left Ear: External ear normal.     Nose: Nose normal.     Mouth/Throat:     Pharynx: No oropharyngeal exudate.  Eyes:     General: No scleral icterus.       Right eye: No discharge.        Left eye: No discharge.     Conjunctiva/sclera: Conjunctivae normal.     Pupils: Pupils are equal, round, and reactive to light.  Neck:     Thyroid: No thyromegaly.     Vascular: No JVD.     Trachea: No tracheal deviation.  Cardiovascular:     Rate and Rhythm: Normal rate and regular rhythm.     Heart sounds: Normal heart sounds. No murmur heard.   No friction rub. No gallop.  Pulmonary:     Effort: Pulmonary effort is normal. No respiratory distress.     Breath sounds: Normal breath sounds. No wheezing or rales.  Chest:     Chest wall: No tenderness.  Abdominal:     General: Bowel  sounds are normal. There is no distension.     Palpations: Abdomen is soft. There is no mass.     Tenderness: There is no abdominal tenderness. There is no guarding or rebound.  Genitourinary:    Penis: No tenderness.   Musculoskeletal:        General: No tenderness. Normal range of motion.     Cervical back: Neck supple.  Lymphadenopathy:     Cervical: No cervical adenopathy.  Skin:    General: Skin is warm and dry.     Coloration: Skin is not pale.     Findings: No erythema or rash.  Neurological:     Mental Status: He is alert and oriented to person, place, and time.     Cranial Nerves: No cranial nerve deficit.     Motor: No abnormal muscle tone.     Coordination: Coordination normal.     Deep Tendon Reflexes: Reflexes are normal and symmetric. Reflexes normal.  Psychiatric:        Behavior: Behavior normal.        Thought Content: Thought content normal.  Judgment: Judgment normal.          Assessment & Plan:  Well exam. We discussed diet and exercise. Get fasting labs to include an A1c.  [name] Penna, MD

## 2021-05-30 NOTE — Addendum Note (Signed)
Addended by: Wyvonne Lenz on: 05/30/2021 09:00 AM   Modules accepted: Orders

## 2021-06-03 ENCOUNTER — Telehealth: Payer: Self-pay

## 2021-06-03 NOTE — Telephone Encounter (Signed)
I think him talking to a therapist is a great idea. No referral is needed. He can call [facility] at [phone] to make an appt

## 2021-06-03 NOTE — Telephone Encounter (Signed)
Pt message to Dr Sarajane Jews for advise

## 2021-06-03 NOTE — Addendum Note (Signed)
Addended by: [name] Penna A on: 06/03/2021 07:43 AM   Modules accepted: Orders

## 2021-06-04 NOTE — Telephone Encounter (Signed)
Spoke with pt advised of Dr Fry recommendation, pt verbalized understanding 

## 2021-06-05 ENCOUNTER — Telehealth: Payer: Self-pay | Admitting: Pharmacist

## 2021-06-05 DIAGNOSIS — R361 Hematospermia: Secondary | ICD-10-CM | POA: Diagnosis not present

## 2021-06-05 DIAGNOSIS — N401 Enlarged prostate with lower urinary tract symptoms: Secondary | ICD-10-CM | POA: Diagnosis not present

## 2021-06-05 DIAGNOSIS — R3914 Feeling of incomplete bladder emptying: Secondary | ICD-10-CM | POA: Diagnosis not present

## 2021-06-05 DIAGNOSIS — N5201 Erectile dysfunction due to arterial insufficiency: Secondary | ICD-10-CM | POA: Diagnosis not present

## 2021-06-05 NOTE — Chronic Care Management (AMB) (Signed)
    Chronic Care Management Pharmacy Assistant   Name: [name]  MRN: [id number] DOB: 07-30-1951  06-05-2021 APPOINTMENT REMINDER  [name] was reminded to have all medications, supplements and any blood glucose and blood pressure readings available for review with Jeni Salles, Pharm. D, at his telephone visit on 06-06-2021 at 1.   Care Gaps: Zoster Vaccine- Overdue Flu Vaccine - Overdue AWV Done 12-28-2020  Star Rating Drug: Dapagliflozin (Farxiga) 10 mg - Last filled 05-24-21 30 DS at [facility] Rosuvastatin (Crestor) 10 mg - Last filled 02-07-2021 30 DS at [facility] [name])  24-26-mg - Last filled 04-17-2021 90 DS at [facility]  Any gaps in medications fill history?  None   Medications: Outpatient Encounter Medications as of 06/05/2021  Medication Sig   aspirin 81 MG chewable tablet Chew 1 tablet (81 mg total) by mouth daily.   BRILINTA 90 MG TABS tablet TAKE ONE TABLET BY MOUTH EVERY MORNING and TAKE ONE TABLET BY MOUTH EVERY EVENING   carvedilol (COREG) 3.125 MG tablet Take 1 tablet (3.125 mg total) by mouth 2 (two) times daily with a meal.   dapagliflozin propanediol (FARXIGA) 10 MG TABS tablet Take 1 tablet (10 mg total) by mouth daily before breakfast.   finasteride ([name]) 5 MG tablet Take 1 tablet (5 mg total) by mouth daily.   hydrALAZINE (APRESOLINE) 25 MG tablet Take 1 tablet (25 mg total) by mouth daily.   isosorbide mononitrate (IMDUR) 30 MG 24 hr tablet Take 1 tablet (30 mg total) by mouth daily.   ketoconazole (NIZORAL) 2 % cream Apply 1 application topically daily as needed for irritation.   LORazepam (ATIVAN) 2 MG tablet TAKE 1 TABLET 6 HOURS AS NEEDED FOR ANXIETY. (DOSE CHANGE, FILL 10/14/20 BASED ON PREV PICKUP)   nitroGLYCERIN (NITROSTAT) 0.4 MG SL tablet Place 1 tablet (0.4 mg total) under the tongue every 5 (five) minutes as needed for chest pain.   omeprazole (PRILOSEC) 20 MG capsule Take 1 capsule (20 mg total) by mouth as  needed.   rosuvastatin (CRESTOR) 10 MG tablet Take 1 tablet (10 mg total) by mouth daily.   sacubitril-valsartan (ENTRESTO) 24-26 MG Take 1 tablet by mouth 2 (two) times daily.   spironolactone (ALDACTONE) 25 MG tablet Take 0.5 tablets (12.5 mg total) by mouth daily.   triamcinolone cream (KENALOG) 0.1 % Apply 1 application topically 2 (two) times daily as needed.   [DISCONTINUED] tadalafil (CIALIS) 20 MG tablet Take 20 mg by mouth daily as needed.     No facility-administered encounter medications on file as of 06/05/2021.    [facility] Clinical Pharmacist Assistant [phone]

## 2021-06-06 ENCOUNTER — Ambulatory Visit (INDEPENDENT_AMBULATORY_CARE_PROVIDER_SITE_OTHER): Payer: Medicare PPO | Admitting: Pharmacist

## 2021-06-06 DIAGNOSIS — N1832 Chronic kidney disease, stage 3b: Secondary | ICD-10-CM

## 2021-06-06 DIAGNOSIS — I10 Essential (primary) hypertension: Secondary | ICD-10-CM

## 2021-06-06 DIAGNOSIS — E1122 Type 2 diabetes mellitus with diabetic chronic kidney disease: Secondary | ICD-10-CM

## 2021-06-06 NOTE — Progress Notes (Signed)
Chronic Care Management Pharmacy Note  06/13/2021 Name:  [name] MRN:  [id number] DOB:  [birth year]  Summary: BP is at goal < 130/80 per home and office BP readings  Recommendations/Changes made from today's visit: -Recommended follow up with cardiology in regards to [facility] therapy with only taking once daily  Plan: Follow up in 6 months  Subjective: [name] is an 70 y.o. year old male who is a primary patient of [name], MD.  The CCM team was consulted for assistance with disease management and care coordination needs.    Engaged with patient by telephone for follow up visit in response to provider referral for pharmacy case management and/or care coordination services.   Consent to Services:  The patient was given information about Chronic Care Management services, agreed to services, and gave verbal consent prior to initiation of services.  Please see initial visit note for detailed documentation.   Patient Care Team: [name], MD as PCP - General Tat, Eustace Quail, DO as Consulting Physician (Neurology) Dimitri Ped, RN as Case Manager Viona Gilmore, [facility] as Pharmacist (Pharmacist)  Recent office visits: 05/30/21 [name] Penna, MD: Patient presented for annual exam. Referral placed to nephrology.  04-23-2021 [name], MD - Patient presented for Encephalopathy acute and other concerns. Increased Lorazepam to 2 mg one tab every 6 Hours PRN.  02/14/21 [name], MD (PCP) patient was seen for hospital follow up. Patient restarted Lorazepam 2 MG and nitroglycerin 0.4 MG was prescribed.  12/28/20 [name], LPN: presented for [facility] Wellness Exam.  Recent consult visits: 04-16-2021 Rafael Bihari, FNP [facility]) - Patient presented for CHF follow-up. Prescribed Sacubitril-Valsartan 24-26 mg.   12/05/20 [name], MD Cardiology initial visit patient referred for cardiovascular evaluation.  Hospital  visits: Medication Reconciliation was completed by comparing discharge summary, patient's EMR and Pharmacy list, and upon discussion with patient.   Admitted to the hospital on 04//14/2022 due to Acute Hypoxemic respiratory failure. Discharge date was 02/07/2021. Discharged from [facility]?Medications Started at [facility] Discharge:?? -started the following mediation Carvedilol (COREG) 3.125 mg one tablet by mouth two times daily meals Aspirin 81 mg: take one table daily Furosemide (Lasix) 40 mg: take one tablet as needed for fluid or edema Hydralazine (APRESOLINE)25 mg: take one tablet every 8 hours Isosorbide mononitrate (IMDUR) 30 mg 24 hr: one tablet by mouth daily [name] (PROVIGIL) 100 mg: one tablet daily Potassium Chloride ER 20 MEQ: take one tablet daily as needed. Only when taking laxis Spironolactone (ALDACTONE) 25 mg: take 0.5 tablets daily Ticagrelor (BRILINTA): 90 tablets: place one tablet into feeding tube twice daily   Medication Changes at Hospital Discharge: -Changed  Losartan (COZAAR) 25 mg: one tablet daily Rosuvastatin (CRESTOR) 10 mg: take one tablet daily   Medications Discontinued at Hospital Discharge: -Stopped the following medication Acetaminophen 325 MG tablet (TYLENOL) Amlodipine 5 MG tablet (NORVASC) Cyclobenzaprine 10 MG tablet (FLEXERIL) Duloxetine 20 MG capsule (CYMBALTA) Finasteride 5 MG tablet ([name]) Fluad 0.5 ML Susy Lorazepam 2 MG tablet (ATIVAN) Magic mouthwash solution Melatonin 10 MG Tabs Omeprazole 20 MG capsule (PRILOSEC) Tamsulosin 0.4 MG Caps capsule (FLOMAX) Triamcinolone cream 0.1 % (KENALOG)   Medications that remain the same after Hospital Discharge:?? -All other medications will remain the same.       Objective:  Lab Results  Component Value Date   CREATININE 2.19 (H) 06/09/2021   BUN 37 (H) 06/09/2021  GFR 29.72 (L) 05/30/2021   GFRNONAA 32 (L) 06/09/2021   GFRAA 81 10/25/2019   NA 136  06/09/2021   K 4.2 06/09/2021   CALCIUM 9.0 06/09/2021   CO2 22 06/09/2021   GLUCOSE 130 (H) 06/09/2021    Lab Results  Component Value Date/Time   HGBA1C 6.0 05/30/2021 08:42 AM   HGBA1C 5.7 (H) 01/06/2021 04:08 AM   GFR 29.72 (L) 05/30/2021 08:42 AM   GFR 64.40 05/17/2019 09:05 AM    Last diabetic Eye exam: No results found for: HMDIABEYEEXA  Last diabetic Foot exam: No results found for: HMDIABFOOTEX   Lab Results  Component Value Date   CHOL 119 05/30/2021   HDL 45.10 05/30/2021   LDLCALC 56 05/30/2021   LDLDIRECT 141.9 04/06/2013   TRIG 90.0 05/30/2021   CHOLHDL 3 05/30/2021    Hepatic Function Latest Ref Rng & Units 05/30/2021 02/03/2021 01/31/2021  Total Protein 6.0 - 8.3 g/dL 6.8 6.0(L) 5.7(L)  Albumin 3.5 - 5.2 g/dL 4.2 2.6(L) 2.2(L)  AST 0 - 37 U/L 18 18 26   ALT 0 - 53 U/L 19 45(H) 83(H)  Alk Phosphatase 39 - 117 U/L 38(L) 75 81  Total Bilirubin 0.2 - 1.2 mg/dL 0.5 0.9 0.4  Bilirubin, Direct 0.0 - 0.3 mg/dL 0.1 - -    Lab Results  Component Value Date/Time   TSH 1.99 05/30/2021 08:42 AM   TSH 1.63 05/24/2020 08:29 AM   FREET4 0.89 12/16/2016 02:44 PM    CBC Latest Ref Rng & Units 06/09/2021 05/30/2021 03/05/2021  WBC 4.0 - 10.5 K/uL 20.5(H) 6.7 6.2  Hemoglobin 13.0 - 17.0 g/dL 13.3 12.7(L) 10.2(L)  Hematocrit 39.0 - 52.0 % 38.1(L) 37.7(L) 31.1(L)  Platelets 150 - 400 K/uL 160 150.0 223    No results found for: VD25OH  Clinical ASCVD: Yes  The ASCVD Risk score (Arnett DK, et al., 2019) failed to calculate for the following reasons:   The patient has a prior MI or stroke diagnosis    Depression screen [facility] 2/9 05/30/2021 02/26/2021 12/28/2020  Decreased Interest 0 0 0  Down, Depressed, Hopeless 0 0 0  PHQ - 2 Score 0 0 0  Altered sleeping 1 - -  Tired, decreased energy 0 - -  Change in appetite 0 - -  Feeling bad or failure about yourself  0 - -  Trouble concentrating 0 - -  Moving slowly or fidgety/restless 0 - -  Suicidal thoughts 0 - -  PHQ-9 Score 1 -  -  Difficult doing work/chores Somewhat difficult - -  Some recent data might be hidden     Social History   Tobacco Use  Smoking Status Never  Smokeless Tobacco Never   BP Readings from Last 3 Encounters:  06/10/21 96/69  06/09/21 125/60  05/30/21 110/74   Pulse Readings from Last 3 Encounters:  06/10/21 72  06/09/21 81  05/30/21 (!) 59   Wt Readings from Last 3 Encounters:  06/10/21 181 lb 9.6 oz (82.4 kg)  06/09/21 177 lb (80.3 kg)  05/30/21 182 lb (82.6 kg)   BMI Readings from Last 3 Encounters:  06/10/21 23.96 kg/m  06/09/21 23.35 kg/m  05/30/21 24.86 kg/m    Assessment/Interventions: Review of patient past medical history, allergies, medications, health status, including review of consultants reports, laboratory and other test data, was performed as part of comprehensive evaluation and provision of chronic care management services.   SDOH:  (Social Determinants of Health) assessments and interventions performed: Yes   SDOH Screenings  Alcohol Screen: Low Risk    Last Alcohol Screening Score (AUDIT): 0  Depression (PHQ2-9): Low Risk    PHQ-2 Score: 1  Financial Resource Strain: Low Risk    Difficulty of Paying Living Expenses: Not hard at all  Food Insecurity: No Food Insecurity   Worried About Charity fundraiser in the Last Year: Never true   Ran Out of Food in the Last Year: Never true  Housing: Low Risk    Last Housing Risk Score: 0  Physical Activity: Sufficiently Active   Days of Exercise per Week: 7 days   Minutes of Exercise per Session: 30 min  Social Connections: Socially Isolated   Frequency of Communication with Friends and Family: More than three times a week   Frequency of Social Gatherings with Friends and Family: More than three times a week   Attends Religious Services: Never   Marine scientist or Organizations: No   Attends Music therapist: Never   Marital Status: Never married  Stress: No Stress Concern  Present   Feeling of Stress : Not at all  Tobacco Use: Low Risk    Smoking Tobacco Use: Never   Smokeless Tobacco Use: Never  Transportation Needs: No Transportation Needs   Lack of Transportation (Medical): No   Lack of Transportation (Non-Medical): No   Patient was recently discharged from the hospital and has a lot more medications to manage. The CCM nurse told him about [facility] and he is already considering as he wants some help with managing his medications.   CCM Care Plan  Allergies  Allergen Reactions   Codeine Other (See Comments)    hallucinations   Lisinopril Cough    Medications Reviewed Today     Reviewed by Rockwell Alexandria, CMA (Certified Medical Assistant) on 06/10/21 at [place] List Status: <None>   Medication Order Taking? Sig Documenting Provider Last Dose Status Informant  acetaminophen (TYLENOL) 500 MG tablet [id number] Yes Take 1,000 mg by mouth every 6 (six) hours as needed for mild pain. [provider] Taking Active Self  aspirin 81 MG chewable tablet [id number] Yes Chew 1 tablet (81 mg total) by mouth daily. [name], MD Taking Active Self  BRILINTA 90 MG TABS tablet [id number] Yes TAKE ONE TABLET BY MOUTH EVERY MORNING and TAKE ONE TABLET BY MOUTH EVERY EVENING [name], MD Taking Active Self  carvedilol (COREG) 3.125 MG tablet [id number] Yes Take 1 tablet (3.125 mg total) by mouth 2 (two) times daily with a meal. [name], MD Taking Active Self  dapagliflozin propanediol (FARXIGA) 10 MG TABS tablet [id number] Yes Take 1 tablet (10 mg total) by mouth daily before breakfast. Rafael Bihari, FNP Taking Active Self  diclofenac Sodium (VOLTAREN) 1 % GEL [id number] Yes Apply 2 g topically 2 (two) times daily as needed (pain). [provider] Taking Active Self  finasteride ([name]) 5 MG tablet [id number] Yes Take 1 tablet (5 mg total) by mouth daily. [name], MD Taking Active Self  hydrALAZINE (APRESOLINE) 25  MG tablet [id number] Yes Take 1 tablet (25 mg total) by mouth daily. [name], MD Taking Active Self  HYDROcodone-acetaminophen [facility]) 5-325 MG tablet [id number] Yes Take 1 tablet by mouth every 4 (four) hours as needed for moderate pain. Merlyn Lot, MD Taking Active   isosorbide mononitrate (IMDUR) 30 MG 24 hr tablet [id number] Yes Take 1 tablet (30 mg total) by mouth daily. [name], MD Taking Active  Self  ketoconazole (NIZORAL) 2 % cream [id number] Yes Apply 1 application topically daily as needed for irritation. [provider] Taking Active Self  levofloxacin (LEVAQUIN) 500 MG tablet [id number] Yes Take 1 tablet (500 mg total) by mouth daily for 10 days. Merlyn Lot, MD Taking Active   LORazepam (ATIVAN) 2 MG tablet [id number] Yes TAKE 1 TABLET 6 HOURS AS NEEDED FOR ANXIETY. (DOSE CHANGE, FILL 10/14/20 BASED ON PREV PICKUP) [name], MD Taking Active Self  nitroGLYCERIN (NITROSTAT) 0.4 MG SL tablet [id number] Yes Place 1 tablet (0.4 mg total) under the tongue every 5 (five) minutes as needed for chest pain. [name], MD Taking Active Self  omeprazole (PRILOSEC) 20 MG capsule [id number] Yes Take 1 capsule (20 mg total) by mouth as needed.  Patient taking differently: Take 20 mg by mouth daily as needed (acid reflux).   [name], MD Taking Active   rosuvastatin (CRESTOR) 10 MG tablet [id number] Yes Take 1 tablet (10 mg total) by mouth daily. [name], MD Taking Active Self  sacubitril-valsartan [facility]) 24-26 [place] [id number] Yes Take 1 tablet by mouth 2 (two) times daily. Liscomb, Maricela Bo, FNP Taking Active Self           Med Note [name] Jun 06, 2021 12:34 PM) Often takes only 1 tablet a day.  spironolactone (ALDACTONE) 25 MG tablet [id number] Yes Take 0.5 tablets (12.5 mg total) by mouth daily. [name], MD Taking Active Self  triamcinolone cream (KENALOG) 0.1 % [id number] Yes Apply 1 application topically 2 (two) times  daily as needed (psoriasis). [provider] Taking Active Self            Patient Active Problem List   Diagnosis Date Noted   Type 2 diabetes mellitus with diabetic chronic kidney disease ([place]) 05/30/2021   CKD (chronic kidney disease), stage III ([place]) 05/30/2021   Hypertension    Hemoptysis    Acute hypoxemic respiratory failure (HCC)    Acute renal failure (HCC)    Encephalopathy acute    Cardiogenic shock ([place]) 01/03/2021   Atypical chest pain 10/25/2019   Coronary artery calcification seen on CAT scan 06/15/2019   Nodule of lower lobe of left lung 06/15/2019   Tremor of right hand 04/24/2017   Numbness and tingling of both legs 04/24/2017   Bilateral leg edema 06/05/2015   Chronic neck pain 06/05/2015   Plantar fasciitis 02/01/2014   Visual floaters 04/04/2013   Subjective vision disturbance, left 04/04/2013   BPH with urinary obstruction 01/01/2010   MELANOMA, HX OF 06/01/2009   NECK PAIN 09/11/2008   CELLULITIS AND ABSCESS OF LEG EXCEPT FOOT 04/26/2008   ERECTILE DYSFUNCTION 10/12/2007   Hyperlipemia, mixed 04/12/2007   Anxiety state 04/12/2007   Essential hypertension 04/12/2007   GERD 04/12/2007    Immunization History  Administered Date(s) Administered   Fluad Quad(high Dose 65+) 06/15/2019, 05/30/2021   Influenza Split 08/13/2011, 06/08/2012, 06/22/2013   Influenza Whole 06/22/2005, 06/19/2010   Influenza, High Dose Seasonal PF 07/05/2017, 05/28/2020   Influenza-Unspecified 07/14/2015, 07/12/2016, 07/05/2017   PFIZER Comirnaty(Gray Top)Covid-19 Tri-Sucrose Vaccine 05/10/2021   PFIZER(Purple Top)SARS-COV-2 Vaccination 10/31/2019, 11/26/2019, 07/07/2020   Pneumococcal Conjugate-13 08/11/2013   Pneumococcal Polysaccharide-23 04/23/2016   Td 06/19/2010   Tdap 09/12/2013   Zoster, Live 08/11/2013   Patient has been weighing himself every day and checking his blood pressures as well.  Patient is taking the Entresto once a day sometimes.  Recommended discussing this with cardiology  as this likely isn't benefiting  Patient starts cardiac rehab in 2 weeks. Patient has been keeping up with exercises at home from PT.   Conditions to be addressed/monitored:  Hypertension, Hyperlipidemia, Heart Failure, Coronary Artery Disease, GERD, Depression, Anxiety, and BPH  Conditions addressed this visit: Hypertension, CKD  Care Plan : CCM Pharmacy Care Plan  Updates made by Viona Gilmore, [place] since 06/13/2021 12:00 AM     Problem: Problem: Hypertension, Hyperlipidemia, Heart Failure, Coronary Artery Disease, GERD, Depression, Anxiety, and BPH      Long-Range Goal: Patient-Specific Goal   Start Date: 03/07/2021  Expected End Date: 03/07/2022  Recent Progress: On track  Priority: High  Note:   Current Barriers:  Unable to independently monitor therapeutic efficacy  Pharmacist Clinical Goal(s):  Patient will achieve adherence to monitoring guidelines and medication adherence to achieve therapeutic efficacy through collaboration with PharmD and provider.   Interventions: 1:1 collaboration with [name], MD regarding development and update of comprehensive plan of care as evidenced by provider attestation and co-signature Inter-disciplinary care team collaboration (see longitudinal plan of care) Comprehensive medication review performed; medication list updated in electronic medical record  Hypertension (BP goal <130/80) -Controlled -Current treatment: Carvedilol 3.125 mg 1 tablet twice daily Entresto 24-26 mg 1 tablet twice daily - taking once daily Spironolactone 25 mg 1/2 tablet daily Hydralazine 25 mg 1 tablet once daily -Medications previously tried: losartan (replaced by [name]) -Current home readings: 120-130s/70s -Current dietary habits: strictly limiting salt intake  -Current exercise habits: did not discuss -Denies hypotensive/hypertensive symptoms -Educated on BP goals and benefits of medications for  prevention of heart attack, stroke and kidney damage; Exercise goal of 150 minutes per week; Importance of home blood pressure monitoring; Proper BP monitoring technique; -Counseled to monitor BP at home weekly, document, and provide log at future appointments -Counseled on diet and exercise extensively Recommended to continue current medication  Hyperlipidemia: (LDL goal < 70) -Not ideally controlled -Current treatment: Rosuvastatin 10 mg 1 tablet daily -Medications previously tried: none  -Current dietary patterns: not eating fried foods -Current exercise habits: PT exercises -Educated on Cholesterol goals;  Benefits of statin for ASCVD risk reduction; Exercise goal of 150 minutes per week; -Counseled on diet and exercise extensively Recommended to continue current medication Recommended repeat lipid panel  Heart Failure (Goal: manage symptoms and prevent exacerbations) -Controlled -Last ejection fraction: 25-30% (Date: 4/22) -HF type: Systolic -NYHA Class: II (slight limitation of activity) -AHA HF Stage: B (Heart disease present - no symptoms present) -Current treatment: Farxiga 10 mg 1 tablet before breakfast Furosemide 40 mg 1 tablet as needed Carvedilol 3.125 mg 1 tablet twice daily Entresto 24-26 mg 1 tablet twice daily - taking once daily Spironolactone 25 mg 1/2 tablet daily Hydralazine 25 mg 1 tablet once daily -Medications previously tried: none  -Current home BP/HR readings: 120-130s/70s -Current dietary habits: started a salt diary and not going over 1000 mg per day since starting -Current exercise habits: PT exercises every day; walking dog every morning -Educated on Benefits of medications for managing symptoms and prolonging life Importance of weighing daily; if you gain more than 3 pounds in one day or 5 pounds in one week, call cardiologist Proper diuretic administration and potassium supplementation -Counseled on diet and exercise extensively Recommended  to continue current medication  CAD/STEMI (Goal: prevent future cardiac events) -Controlled -Current treatment  Aspirin 81 mg 1 tablet daily Brilinta 90 mg 1 tablet twice daily Isosorbide mononitrate 30 mg 1 tablet daily -Medications previously  tried: n/a  -Recommended to continue current medication Counseled on difference between isosorbide and nitroglycerin. Patient is aware to avoid NSAIDs and to monitor for bleeding  Depression/Anxiety (Goal: minimize symptoms) -Not ideally controlled -Current treatment: Lorazepam 1 mg as needed -Medications previously tried/failed: Cymbalta  -PHQ9: 0 -GAD7: n/a -Educated on Benefits of medication for symptom control Benefits of cognitive-behavioral therapy with or without medication -Recommended to continue current medication Counseled on risks of long term use of benzodiazepines  GERD (Goal: minimize symptoms) -Controlled -Current treatment  Omeprazole 20 mg 1 capsule daily as needed -Medications previously tried: none  -Recommended to continue current medication  BPH (Goal: minimize symptoms) -Controlled -Current treatment  Finasteride 5 mg 1 tablet daily -Medications previously tried: none  -Recommended to continue current medication   Health Maintenance -Vaccine gaps: shingrix, COVID booster -Current therapy:  Acetaminophen 500 mg as needed -Educated on Cost vs benefit of each product must be carefully weighed by individual consumer -Patient is satisfied with current therapy and denies issues -Recommended to continue current medication Educated on if he wanted to restart melatonin he could with a low dose  Patient Goals/Self-Care Activities Patient will:  - take medications as prescribed check blood pressure at least weekly, document, and provide at future appointments target a minimum of 150 minutes of moderate intensity exercise weekly  Follow Up Plan: Telephone follow up appointment with care management team member  scheduled for: 6 months       Medication Assistance: None required.  Patient affirms current coverage meets needs.  Compliance/Adherence/Medication fill history: Care Gaps: Shingrix, COVID booster  Star-Rating Drugs: Dapagliflozin (Farxiga) 10 mg - Last filled 05-24-21 30 DS at [facility] Rosuvastatin (Crestor) 10 mg - Last filled 02-07-2021 30 DS at [facility] [facility])  24-26-mg - Last filled 04-17-2021 90 DS at [facility]  Patient's preferred pharmacy is:  [id number] [place] [id number]: [phone]: [phone] [facility] [place] [place]Phone: [phone]: [phone] Uses pill box? Yes Pt endorses 99% compliance - exception of hydralazine which he is taking once daily  We discussed: Benefits of medication synchronization, packaging and delivery as well as enhanced pharmacist oversight with [facility]. Patient decided to: Utilize [facility] pharmacy for medication synchronization, packaging and delivery  Care Plan and Follow Up Patient Decision:  Patient agrees to Care Plan and Follow-up.  Plan: Telephone follow up appointment with care management team member scheduled for:  6 months  MJeni Salles PharmD, BAscensionPharmacist [phone]

## 2021-06-09 ENCOUNTER — Emergency Department: Payer: Medicare PPO

## 2021-06-09 ENCOUNTER — Other Ambulatory Visit: Payer: Self-pay

## 2021-06-09 ENCOUNTER — Emergency Department
Admission: EM | Admit: 2021-06-09 | Discharge: 2021-06-09 | Disposition: A | Discharge status: Home / Self Care | Payer: Medicare PPO | Attending: Student in an Organized Health Care Education/Training Program | Admitting: Student in an Organized Health Care Education/Training Program

## 2021-06-09 DIAGNOSIS — E1122 Type 2 diabetes mellitus with diabetic chronic kidney disease: Secondary | ICD-10-CM | POA: Insufficient documentation

## 2021-06-09 DIAGNOSIS — D72829 Elevated white blood cell count, unspecified: Secondary | ICD-10-CM | POA: Insufficient documentation

## 2021-06-09 DIAGNOSIS — N50811 Right testicular pain: Secondary | ICD-10-CM | POA: Diagnosis present

## 2021-06-09 DIAGNOSIS — Z79899 Other long term (current) drug therapy: Secondary | ICD-10-CM | POA: Insufficient documentation

## 2021-06-09 DIAGNOSIS — N433 Hydrocele, unspecified: Secondary | ICD-10-CM | POA: Insufficient documentation

## 2021-06-09 DIAGNOSIS — N5082 Scrotal pain: Secondary | ICD-10-CM | POA: Diagnosis not present

## 2021-06-09 DIAGNOSIS — N183 Chronic kidney disease, stage 3 unspecified: Secondary | ICD-10-CM | POA: Diagnosis not present

## 2021-06-09 DIAGNOSIS — N453 Epididymo-orchitis: Secondary | ICD-10-CM | POA: Insufficient documentation

## 2021-06-09 DIAGNOSIS — N5089 Other specified disorders of the male genital organs: Secondary | ICD-10-CM | POA: Diagnosis not present

## 2021-06-09 DIAGNOSIS — I129 Hypertensive chronic kidney disease with stage 1 through stage 4 chronic kidney disease, or unspecified chronic kidney disease: Secondary | ICD-10-CM | POA: Insufficient documentation

## 2021-06-09 DIAGNOSIS — Z7982 Long term (current) use of aspirin: Secondary | ICD-10-CM | POA: Diagnosis not present

## 2021-06-09 DIAGNOSIS — N451 Epididymitis: Secondary | ICD-10-CM | POA: Diagnosis not present

## 2021-06-09 DIAGNOSIS — Z85828 Personal history of other malignant neoplasm of skin: Secondary | ICD-10-CM | POA: Insufficient documentation

## 2021-06-09 DIAGNOSIS — N452 Orchitis: Secondary | ICD-10-CM | POA: Diagnosis not present

## 2021-06-09 LAB — CBC
HCT: 38.1 % — ABNORMAL LOW (ref 39.0–52.0)
Hemoglobin: 13.3 g/dL (ref 13.0–17.0)
MCH: 33 pg (ref 26.0–34.0)
MCHC: 34.9 g/dL (ref 30.0–36.0)
MCV: 94.5 fL (ref 80.0–100.0)
Platelets: 160 10*3/uL (ref 150–400)
RBC: 4.03 MIL/uL — ABNORMAL LOW (ref 4.22–5.81)
RDW: 12.7 % (ref 11.5–15.5)
WBC: 20.5 10*3/uL — ABNORMAL HIGH (ref 4.0–10.5)
nRBC: 0 % (ref 0.0–0.2)

## 2021-06-09 LAB — BASIC METABOLIC PANEL
Anion gap: 11 (ref 5–15)
BUN: 37 mg/dL — ABNORMAL HIGH (ref 8–23)
CO2: 22 mmol/L (ref 22–32)
Calcium: 9 mg/dL (ref 8.9–10.3)
Chloride: 103 mmol/L (ref 98–111)
Creatinine, Ser: 2.19 mg/dL — ABNORMAL HIGH (ref 0.61–1.24)
GFR, Estimated: 32 mL/min — ABNORMAL LOW (ref >60)
Glucose, Bld: 130 mg/dL — ABNORMAL HIGH (ref 70–99)
Potassium: 4.2 mmol/L (ref 3.5–5.1)
Sodium: 136 mmol/L (ref 135–145)

## 2021-06-09 MED ORDER — LEVOFLOXACIN 500 MG PO TABS: 500.0000 mg | ORAL_TABLET | Freq: Every day | ORAL | 0 refills | Status: AC

## 2021-06-09 MED ORDER — HYDROCODONE-ACETAMINOPHEN 5-325 MG PO TABS
1.0000 | ORAL_TABLET | Freq: Once | ORAL | Status: AC
  Administered 2021-06-09: 1 via ORAL
  Filled 2021-06-09: qty 1

## 2021-06-09 MED ORDER — SODIUM CHLORIDE 0.9 % IV BOLUS
500.0000 mL | Freq: Once | INTRAVENOUS | Status: AC
  Administered 2021-06-09: 500 mL via INTRAVENOUS

## 2021-06-09 MED ORDER — SODIUM CHLORIDE 0.9 % IV SOLN
3.0000 g | Freq: Once | INTRAVENOUS | Status: AC
  Administered 2021-06-09: 3 g via INTRAVENOUS
  Filled 2021-06-09: qty 8

## 2021-06-09 MED ORDER — HYDROCODONE-ACETAMINOPHEN 5-325 MG PO TABS: 1.0000 | ORAL_TABLET | ORAL | 0 refills | Status: DC | PRN

## 2021-06-09 NOTE — ED Notes (Signed)
Signature pad broken. Pt given verbal consent to DC.

## 2021-06-09 NOTE — ED Triage Notes (Signed)
Pt comes pov with right sided testicle pain, redness, swelling for 2 days.

## 2021-06-09 NOTE — ED Provider Notes (Signed)
[facility] Emergency Department Provider Note    Event Date/Time   First MD Initiated Contact with Patient 06/09/21 1524     (approximate)  I have reviewed the triage vital signs and the nursing notes.   HISTORY  Chief Complaint Testicle Pain    HPI [name] is a 70 y.o. male with the below listed past medical history presents to the ER for evaluation of right testicular pain over the past 2 to 3 days.  Denies any fevers no nausea or vomiting.  Started noticing swelling.  Has not been on any recent antibiotics.  Has not had any dysuria.  Denies any trauma.  Is not sexually active.  Has never had any history of tumor or STI no history of epididymitis or orchitis in the past.  States the pain is moderate to severe.  Past Medical History:  Diagnosis Date   Anxiety    Arthritis    Cancer ([place]) 2010   basal cell ca, head   Cataract    bilateral   Depression    ED (erectile dysfunction)    GERD (gastroesophageal reflux disease)    Hyperlipidemia    Hypertension    Kidney stone 07-29-11   passed    Neuromuscular disorder ([place])    neuropathy legs   Vitreous floater    left eye, sees Dr. Dawna [facility] at [facility]    Family History  Problem Relation Age of Onset   Hypertension Mother    Hyperlipidemia Mother    Depression Other    Diabetes Other    Hyperlipidemia Other    Hypertension Other    Alzheimer's disease Other    Colon cancer Neg Hx    Past Surgical History:  Procedure Laterality Date   BASAL CELL CARCINOMA EXCISION     removed from scalp   COLONOSCOPY  07/03/2017   per Dr. Havery Moros, sessile serrated polyp, repeat in 5 yrs    [place] CATH AND CORONARY ANGIOGRAPHY N/A 01/03/2021   Procedure: LEFT HEART CATH AND CORONARY ANGIOGRAPHY;  Surgeon: [place] Sine, MD;  Location: [place] CV [facility];  Service: Cardiovascular;  Laterality: N/A;   RIGHT HEART CATH N/A 01/03/2021   Procedure: RIGHT HEART CATH;   Surgeon: Jolaine Artist, MD;  Location: [place] [facility];  Service: Cardiovascular;  Laterality: N/A;   Patient Active Problem List   Diagnosis Date Noted   Type 2 diabetes mellitus with diabetic chronic kidney disease ([place]) 05/30/2021   CKD (chronic kidney disease), stage III ([place]) 05/30/2021   Hypertension    Hemoptysis    Acute hypoxemic respiratory failure (HCC)    Acute renal failure (HCC)    Encephalopathy acute    Cardiogenic shock ([place]) 01/03/2021   Atypical chest pain 10/25/2019   Coronary artery calcification seen on CAT scan 06/15/2019   Nodule of lower lobe of left lung 06/15/2019   Tremor of right hand 04/24/2017   Numbness and tingling of both legs 04/24/2017   Bilateral leg edema 06/05/2015   Chronic neck pain 06/05/2015   Plantar fasciitis 02/01/2014   Visual floaters 04/04/2013   Subjective vision disturbance, left 04/04/2013   BPH with urinary obstruction 01/01/2010   MELANOMA, HX OF 06/01/2009   NECK PAIN 09/11/2008   CELLULITIS AND ABSCESS OF LEG EXCEPT FOOT 04/26/2008   ERECTILE DYSFUNCTION 10/12/2007   Hyperlipemia, mixed 04/12/2007   Anxiety state 04/12/2007   Essential hypertension 04/12/2007   GERD 04/12/2007  Prior to Admission medications   Medication Sig Start Date End Date Taking? Authorizing Provider  HYDROcodone-acetaminophen (NORCO) 5-325 MG tablet Take 1 tablet by mouth every 4 (four) hours as needed for moderate pain. 06/09/21  Yes Merlyn Lot, MD  levofloxacin (LEVAQUIN) 500 MG tablet Take 1 tablet (500 mg total) by mouth daily for 10 days. 06/09/21 06/19/21 Yes Merlyn Lot, MD  acetaminophen (TYLENOL) 500 MG tablet Take 1,000 mg by mouth every 6 (six) hours as needed for mild pain.    [provider]  aspirin 81 MG chewable tablet Chew 1 tablet (81 mg total) by mouth daily. 03/21/21   [name], MD  BRILINTA 90 MG TABS tablet TAKE ONE TABLET BY MOUTH EVERY MORNING and TAKE ONE TABLET BY MOUTH EVERY EVENING  05/17/21   [name], MD  carvedilol (COREG) 3.125 MG tablet Take 1 tablet (3.125 mg total) by mouth 2 (two) times daily with a meal. 03/21/21   [name], MD  dapagliflozin propanediol (FARXIGA) 10 MG TABS tablet Take 1 tablet (10 mg total) by mouth daily before breakfast. 03/05/21   [place], Maricela Bo, FNP  diclofenac Sodium (VOLTAREN) 1 % GEL Apply 2 g topically 2 (two) times daily as needed (pain).    [provider]  finasteride ([name]) 5 MG tablet Take 1 tablet (5 mg total) by mouth daily. 03/21/21   [name], MD  hydrALAZINE (APRESOLINE) 25 MG tablet Take 1 tablet (25 mg total) by mouth daily. 03/21/21 06/06/21  [name], MD  isosorbide mononitrate (IMDUR) 30 MG 24 hr tablet Take 1 tablet (30 mg total) by mouth daily. 05/30/21   [name], MD  ketoconazole (NIZORAL) 2 % cream Apply 1 application topically daily as needed for irritation.    [provider]  LORazepam (ATIVAN) 2 MG tablet TAKE 1 TABLET 6 HOURS AS NEEDED FOR ANXIETY. (DOSE CHANGE, FILL 10/14/20 BASED ON PREV PICKUP) 04/02/21   [name], MD  nitroGLYCERIN (NITROSTAT) 0.4 MG SL tablet Place 1 tablet (0.4 mg total) under the tongue every 5 (five) minutes as needed for chest pain. 02/14/21   [name], MD  omeprazole (PRILOSEC) 20 MG capsule Take 1 capsule (20 mg total) by mouth as needed. Patient taking differently: Take 20 mg by mouth daily as needed (acid reflux). 05/30/21   [name], MD  rosuvastatin (CRESTOR) 10 MG tablet Take 1 tablet (10 mg total) by mouth daily. 05/30/21   [name], MD  sacubitril-valsartan (ENTRESTO) 24-26 MG Take 1 tablet by mouth 2 (two) times daily. 04/16/21   Rafael Bihari, FNP  spironolactone (ALDACTONE) 25 MG tablet Take 0.5 tablets (12.5 mg total) by mouth daily. 03/21/21   [name], MD  triamcinolone cream (KENALOG) 0.1 % Apply 1 application topically 2 (two) times daily as needed (psoriasis).    [provider]  tadalafil  (CIALIS) 20 MG tablet Take 20 mg by mouth daily as needed.    12/17/11  [provider]    Allergies Codeine and Lisinopril    Social History Social History   Tobacco Use   Smoking status: Never   Smokeless tobacco: Never  Vaping Use   Vaping Use: Never used  Substance Use Topics   Alcohol use: No    Alcohol/week: 0.0 standard drinks   Drug use: No    Review of Systems Patient denies headaches, rhinorrhea, blurry vision, numbness, shortness of breath, chest pain, edema, cough, abdominal pain, nausea,  vomiting, diarrhea, dysuria, fevers, rashes or hallucinations unless otherwise stated above in HPI. ____________________________________________   PHYSICAL EXAM:  VITAL SIGNS: Vitals:   06/09/21 1224 06/09/21 1526  BP: (!) 106/58 129/62  Pulse: 76 70  Resp: 18 16  Temp: 98.9 F (37.2 C)   SpO2: 97% 98%    Constitutional: Alert and oriented.  Eyes: Conjunctivae are normal.  Head: Atraumatic. Nose: No congestion/rhinnorhea. Mouth/Throat: Mucous membranes are moist.   Neck: No stridor. Painless ROM.  Cardiovascular: Normal rate, regular rhythm. Grossly normal heart sounds.  Good peripheral circulation. Respiratory: Normal respiratory effort.  No retractions. Lungs CTAB. Gastrointestinal: Soft and nontender. No distention. No abdominal bruits. No CVA tenderness. Genitourinary: Scrotum is swollen with some erythema no crepitus no discoloration no perennial erythema or discoloration pain is improved with elevating the right testicle. Musculoskeletal: No lower extremity tenderness nor edema.  No joint effusions. Neurologic:  Normal speech and language. No gross focal neurologic deficits are appreciated. No facial droop Skin:  Skin is warm, dry and intact. No rash noted. Psychiatric: Mood and affect are normal. Speech and behavior are normal.  ____________________________________________   LABS (all labs ordered are listed, but only abnormal results are  displayed)  Results for orders placed or performed during the hospital encounter of 06/09/21 (from the past 24 hour(s))  CBC     Status: Abnormal   Collection Time: 06/09/21 12:27 PM  Result Value Ref Range   WBC 20.5 (H) 4.0 - 10.5 K/uL   RBC 4.03 (L) 4.22 - 5.81 MIL/uL   Hemoglobin 13.3 13.0 - 17.0 g/dL   HCT 38.1 (L) 39.0 - 52.0 %   MCV 94.5 80.0 - 100.0 fL   MCH 33.0 26.0 - 34.0 pg   MCHC 34.9 30.0 - 36.0 g/dL   RDW 12.7 11.5 - 15.5 %   Platelets 160 150 - 400 K/uL   nRBC 0.0 0.0 - 0.2 %  Basic metabolic panel     Status: Abnormal   Collection Time: 06/09/21 12:27 PM  Result Value Ref Range   Sodium 136 135 - 145 mmol/L   Potassium 4.2 3.5 - 5.1 mmol/L   Chloride 103 98 - 111 mmol/L   CO2 22 22 - 32 mmol/L   Glucose, Bld 130 (H) 70 - 99 mg/dL   BUN 37 (H) 8 - 23 mg/dL   Creatinine, Ser 2.19 (H) 0.61 - 1.24 mg/dL   Calcium 9.0 8.9 - 10.3 mg/dL   GFR, Estimated 32 (L) >60 mL/min   Anion gap 11 5 - 15   ____________________________________________ ____________________________________________  RADIOLOGY  I personally reviewed all radiographic images ordered to evaluate for the above acute complaints and reviewed radiology reports and findings.  These findings were personally discussed with the patient.  Please see medical record for radiology report.  ____________________________________________   PROCEDURES  Procedure(s) performed:  Procedures    Critical Care performed: no ____________________________________________   INITIAL IMPRESSION / ASSESSMENT AND PLAN / ED COURSE  Pertinent labs & imaging results that were available during my care of the patient were reviewed by me and considered in my medical decision making (see chart for details).   DDX: orchitis, epididymitis, abscess, cellulitis, fourniers  [name] is a 70 y.o. who presents to the ED with presentation as described above.  Presentation consistent with epididymitis and orchitis.  White  count elevated at 20,000 but he is nontoxic-appearing afebrile hemodynamically stable.  Ultrasound shows no evidence of torsion.  I discussed results of imaging with Dr. Junious Silk  of urology who recommends dose of IV antibiotics oral antibiotics and follow-up in clinic.  Patient has listed allergy to narcotic pain medication but states has been able to tolerate some in the past and states that he just would get confused and have hallucinations if taking them for a long period of time.  Would like to try something here in the ER.     The patient was evaluated in Emergency Department today for the symptoms described in the history of present illness. He/she was evaluated in the context of the global COVID-19 pandemic, which necessitated consideration that the patient might be at risk for infection with the SARS-CoV-2 virus that causes COVID-19. Institutional protocols and algorithms that pertain to the evaluation of patients at risk for COVID-19 are in a state of rapid change based on information released by regulatory bodies including the CDC and federal and state organizations. These policies and algorithms were followed during the patient's care in the ED.  As part of my medical decision making, I reviewed the following data within the [facility] notes reviewed and incorporated, Labs reviewed, notes from prior ED visits and [place] Controlled Substance Database   ____________________________________________   FINAL CLINICAL IMPRESSION(S) / ED DIAGNOSES  Final diagnoses:  Scrotal pain  Orchitis and epididymitis      NEW MEDICATIONS STARTED DURING THIS VISIT:  New Prescriptions   HYDROCODONE-ACETAMINOPHEN (NORCO) 5-325 MG TABLET    Take 1 tablet by mouth every 4 (four) hours as needed for moderate pain.   LEVOFLOXACIN (LEVAQUIN) 500 MG TABLET    Take 1 tablet (500 mg total) by mouth daily for 10 days.     Note:  This document was prepared using Dragon voice recognition  software and may include unintentional dictation errors.    Merlyn Lot, MD 06/09/21 [id number]

## 2021-06-09 NOTE — ED Notes (Signed)
RN to bedside to introduce self to pt.   MD robinson at bedside.   Pt advised his pain started yesterday and gradually got worse.  Pt denies any burning upon urination, He does have BPH problems.   MD to examine testicle.

## 2021-06-10 ENCOUNTER — Encounter (HOSPITAL_COMMUNITY): Payer: Self-pay | Admitting: Internal Medicine

## 2021-06-10 ENCOUNTER — Ambulatory Visit (HOSPITAL_BASED_OUTPATIENT_CLINIC_OR_DEPARTMENT_OTHER)
Admission: RE | Admit: 2021-06-10 | Discharge: 2021-06-10 | Disposition: A | Discharge status: Home / Self Care | Payer: Medicare PPO | Source: Ambulatory Visit | Attending: Internal Medicine | Admitting: Internal Medicine

## 2021-06-10 ENCOUNTER — Ambulatory Visit (HOSPITAL_COMMUNITY)
Admission: RE | Admit: 2021-06-10 | Discharge: 2021-06-10 | Disposition: A | Discharge status: Home / Self Care | Payer: Medicare PPO | Source: Ambulatory Visit | Attending: Internal Medicine | Admitting: Internal Medicine

## 2021-06-10 VITALS — BP 96/69 | HR 72 | Wt 181.6 lb

## 2021-06-10 DIAGNOSIS — I2510 Atherosclerotic heart disease of native coronary artery without angina pectoris: Secondary | ICD-10-CM | POA: Insufficient documentation

## 2021-06-10 DIAGNOSIS — I2583 Coronary atherosclerosis due to lipid rich plaque: Secondary | ICD-10-CM | POA: Diagnosis not present

## 2021-06-10 DIAGNOSIS — E785 Hyperlipidemia, unspecified: Secondary | ICD-10-CM | POA: Diagnosis not present

## 2021-06-10 DIAGNOSIS — Z7901 Long term (current) use of anticoagulants: Secondary | ICD-10-CM | POA: Insufficient documentation

## 2021-06-10 DIAGNOSIS — Z09 Encounter for follow-up examination after completed treatment for conditions other than malignant neoplasm: Secondary | ICD-10-CM | POA: Insufficient documentation

## 2021-06-10 DIAGNOSIS — Z8249 Family history of ischemic heart disease and other diseases of the circulatory system: Secondary | ICD-10-CM | POA: Insufficient documentation

## 2021-06-10 DIAGNOSIS — N1832 Chronic kidney disease, stage 3b: Secondary | ICD-10-CM | POA: Diagnosis not present

## 2021-06-10 DIAGNOSIS — Z87820 Personal history of traumatic brain injury: Secondary | ICD-10-CM | POA: Insufficient documentation

## 2021-06-10 DIAGNOSIS — I130 Hypertensive heart and chronic kidney disease with heart failure and stage 1 through stage 4 chronic kidney disease, or unspecified chronic kidney disease: Secondary | ICD-10-CM | POA: Insufficient documentation

## 2021-06-10 DIAGNOSIS — I5022 Chronic systolic (congestive) heart failure: Secondary | ICD-10-CM | POA: Diagnosis not present

## 2021-06-10 DIAGNOSIS — Z8674 Personal history of sudden cardiac arrest: Secondary | ICD-10-CM | POA: Insufficient documentation

## 2021-06-10 DIAGNOSIS — I252 Old myocardial infarction: Secondary | ICD-10-CM | POA: Insufficient documentation

## 2021-06-10 DIAGNOSIS — Z79899 Other long term (current) drug therapy: Secondary | ICD-10-CM | POA: Insufficient documentation

## 2021-06-10 DIAGNOSIS — Z7982 Long term (current) use of aspirin: Secondary | ICD-10-CM | POA: Diagnosis not present

## 2021-06-10 LAB — ECHOCARDIOGRAM COMPLETE
AR max vel: 2.3 cm2
AV Area VTI: 2.4 cm2
AV Area mean vel: 2.29 cm2
AV Mean grad: 5 mmHg
AV Peak grad: 8.1 mmHg
Ao pk vel: 1.42 m/s
Area-P 1/2: 3.85 cm2
Calc EF: 58.3 %
S' Lateral: 2.8 cm
Single Plane A2C EF: 69 %
Single Plane A4C EF: 45.6 %

## 2021-06-10 NOTE — Patient Instructions (Addendum)
1, Stop hydralazine completely 2. Stop isosorbide (Imdur) completely 3. Do take [place] for 3 days. Resume on Friday 4. We will check labs next week  5. Stop  spirolactone Your physician recommends that you schedule a follow-up appointment in: 2 months  Follow up lab work in 1 week  If you have any questions or concerns before your next appointment please send [place] a message through [place] or call our office at [phone].    TO LEAVE A MESSAGE FOR THE NURSE SELECT OPTION 2, PLEASE LEAVE A MESSAGE INCLUDING: YOUR NAME DATE OF BIRTH CALL BACK NUMBER REASON FOR CALL**this is important as we prioritize the call backs  YOU WILL RECEIVE A CALL BACK THE SAME DAY AS LONG AS YOU CALL BEFORE 4:00 PM  At the [facility], you and your health needs are our priority. As part of our continuing mission to provide you with exceptional heart care, we have created designated Provider Care Teams. These Care Teams include your primary Cardiologist (physician) and Advanced Practice Providers (APPs- Physician Assistants and Nurse Practitioners) who all work together to provide you with the care you need, when you need it.   You may see any of the following providers on your designated Care Team at your next follow up: Dr Glori Bickers Dr Loralie Champagne Dr Patrice Paradise, NP Lyda Jester, [place] Ginnie Smart Audry Riles, PharmD   Please be sure to bring in all your medications bottles to every appointment.

## 2021-06-10 NOTE — Addendum Note (Signed)
Encounter addended by: Stanford Scotland, RN on: 06/10/2021 4:10 PM  Actions taken: Medication long-term status modified, Order list changed, Diagnosis association updated, Clinical Note Signed

## 2021-06-10 NOTE — Progress Notes (Signed)
  Echocardiogram 2D Echocardiogram has been performed.  Merrie Roof F 06/10/2021, 3:04 PM

## 2021-06-10 NOTE — Progress Notes (Signed)
Advanced Heart Failure Clinic Note   PCP: Dr. [name] Penna HF Cardiologist: Dr. Haroldine Laws  HPI:  [name] is a 70 year old male with past medical history of hypertension, hyperlipidemia, VF arrest, CAD, and systolic heart failure.  He was admitted 4/22 with STEMI and cardiac arrest prior to coming to hospital.  He underwent emergent cardiac catheterization and PCI with stent to proximal RCA. EF 25 to 30%.  Patient did have prolonged ICU stay for cardiogenic/hemorrhagic shock requiring vasopressors, Impella and transfusion.  He suffered from diminished mental status and MRI showed scattered punctate infarction concerning for anoxic injury.  He had AKI requiring CRRT, then transitioned to [facility].  Palliative care was consulted and made DNR.  He was discharged to home on oxygen with Home Health, discharge weight 175.5 lbs.  Today he returns for HF follow up. Has been feeling well but over past few days. Went to [facility] yesterday and found to have epididmyitis/orchitis with WBC 21k. SBP at home 110-115. Fairly active. Occasional chest fullness. But no frank angina. Feels like it has gotten better. No edema, orthopnea or PND. Has been having some orthostasis on occasion for past 1-2 months. Now poor po intake with recent  infection.  Echo today EF 55-60% RV mildly HK Personally reviewed   Cardiac Studies: - Echo (4/22): EF 25-30%, severe LV dysfunction, RV moderately reduced  - LHC (4/22): Ost LAD to Prox LAD lesion is 50% stenosed. Mid LAD lesion is 20% stenosed. Prox RCA-1 lesion is 100% stenosed, s/p PCI+DES Prox RCA-2 lesion is 50% stenosed.  - RHC (4/22): On NE and Impella RA = 4 RV = 19/3 PA = 20/10 (11) PCW = 7 Fick cardiac output/index = 2.6/1.7 FA sat = 100% PA sat = 54%  ROS: All systems negative except as listed in HPI, PMH and Problem List.  SH:  Social History   Socioeconomic History   Marital status: Single    Spouse name: Not on file   Number of children: Not  on file   Years of education: Not on file   Highest education level: Not on file  Occupational History   Occupation: retired    Comment: Pharmacist, hospital  Tobacco Use   Smoking status: Never   Smokeless tobacco: Never  Vaping Use   Vaping Use: Never used  Substance and Sexual Activity   Alcohol use: No    Alcohol/week: 0.0 standard drinks   Drug use: No   Sexual activity: Not on file  Other Topics Concern   Not on file  Social History Narrative   Not on file   Social Determinants of Health   Financial Resource Strain: Low Risk    Difficulty of Paying Living Expenses: Not hard at all  Food Insecurity: No Food Insecurity   Worried About Charity fundraiser in the Last Year: Never true   [place] in the Last Year: Never true  Transportation Needs: No Transportation Needs   Lack of Transportation (Medical): No   Lack of Transportation (Non-Medical): No  Physical Activity: Sufficiently Active   Days of Exercise per Week: 7 days   Minutes of Exercise per Session: 30 min  Stress: No Stress Concern Present   Feeling of Stress : Not at all  Social Connections: Socially Isolated   Frequency of Communication with Friends and Family: More than three times a week   Frequency of Social Gatherings with Friends and Family: More than three times a week   Attends Religious Services:  Never   Active Member of Clubs or Organizations: No   Attends Archivist Meetings: Never   Marital Status: Never married  Human resources officer Violence: Not At Risk   Fear of Current or Ex-Partner: No   Emotionally Abused: No   Physically Abused: No   Sexually Abused: No   FH:  Family History  Problem Relation Age of Onset   Hypertension Mother    Hyperlipidemia Mother    Depression Other    Diabetes Other    Hyperlipidemia Other    Hypertension Other    Alzheimer's disease Other    Colon cancer Neg Hx    Past Medical History:  Diagnosis Date   Anxiety    Arthritis    Cancer ([place]) 2010    basal cell ca, head   Cataract    bilateral   Depression    ED (erectile dysfunction)    GERD (gastroesophageal reflux disease)    Hyperlipidemia    Hypertension    Kidney stone 07-29-11   passed    Neuromuscular disorder ([place])    neuropathy legs   Vitreous floater    left eye, sees Dr. Dawna Part at [facility]    Current Outpatient Medications  Medication Sig Dispense Refill   acetaminophen (TYLENOL) 500 MG tablet Take 1,000 mg by mouth every 6 (six) hours as needed for mild pain.     aspirin 81 MG chewable tablet Chew 1 tablet (81 mg total) by mouth daily. 90 tablet 0   BRILINTA 90 MG TABS tablet TAKE ONE TABLET BY MOUTH EVERY MORNING and TAKE ONE TABLET BY MOUTH EVERY EVENING 120 tablet 1   carvedilol (COREG) 3.125 MG tablet Take 1 tablet (3.125 mg total) by mouth 2 (two) times daily with a meal. 180 tablet 1   dapagliflozin propanediol (FARXIGA) 10 MG TABS tablet Take 1 tablet (10 mg total) by mouth daily before breakfast. 30 tablet 11   diclofenac Sodium (VOLTAREN) 1 % GEL Apply 2 g topically 2 (two) times daily as needed (pain).     finasteride ([name]) 5 MG tablet Take 1 tablet (5 mg total) by mouth daily. 90 tablet 0   hydrALAZINE (APRESOLINE) 25 MG tablet Take 1 tablet (25 mg total) by mouth daily. 90 tablet 0   HYDROcodone-acetaminophen (NORCO) 5-325 MG tablet Take 1 tablet by mouth every 4 (four) hours as needed for moderate pain. 6 tablet 0   isosorbide mononitrate (IMDUR) 30 MG 24 hr tablet Take 1 tablet (30 mg total) by mouth daily. 90 tablet 3   ketoconazole (NIZORAL) 2 % cream Apply 1 application topically daily as needed for irritation.     levofloxacin (LEVAQUIN) 500 MG tablet Take 1 tablet (500 mg total) by mouth daily for 10 days. 10 tablet 0   LORazepam (ATIVAN) 2 MG tablet TAKE 1 TABLET 6 HOURS AS NEEDED FOR ANXIETY. (DOSE CHANGE, FILL 10/14/20 BASED ON PREV PICKUP) 90 tablet 5   nitroGLYCERIN (NITROSTAT) 0.4 MG SL tablet Place 1 tablet (0.4 mg total) under  the tongue every 5 (five) minutes as needed for chest pain. 50 tablet 3   omeprazole (PRILOSEC) 20 MG capsule Take 1 capsule (20 mg total) by mouth as needed. (Patient taking differently: Take 20 mg by mouth daily as needed (acid reflux).) 30 capsule 11   rosuvastatin (CRESTOR) 10 MG tablet Take 1 tablet (10 mg total) by mouth daily. 90 tablet 3   sacubitril-valsartan (ENTRESTO) 24-26 MG Take 1 tablet by mouth 2 (two) times daily. [place]  tablet 11   spironolactone (ALDACTONE) 25 MG tablet Take 0.5 tablets (12.5 mg total) by mouth daily. 15 tablet 5   triamcinolone cream (KENALOG) 0.1 % Apply 1 application topically 2 (two) times daily as needed (psoriasis).     No current facility-administered medications for this encounter.   BP 96/69   Pulse 72   Wt 82.4 kg (181 lb 9.6 oz)   SpO2 97%   BMI 23.96 kg/m   Wt Readings from Last 3 Encounters:  06/10/21 82.4 kg (181 lb 9.6 oz)  06/09/21 80.3 kg (177 lb)  05/30/21 82.6 kg (182 lb)   PHYSICAL EXAM: General:  Well appearing. No resp difficulty HEENT: normal Neck: supple. no JVD. Carotids 2+ bilat; no bruits. No lymphadenopathy or thryomegaly appreciated. Cor: PMI nondisplaced. Regular rate & rhythm. No rubs, gallops or murmurs. Lungs: clear Abdomen: soft, nontender, nondistended. No hepatosplenomegaly. No bruits or masses. Good bowel sounds. Extremities: no cyanosis, clubbing, rash, edema Neuro: alert & orientedx3, cranial nerves grossly intact. moves all 4 extremities w/o difficulty. Affect pleasant  ASSESSMENT & PLAN:   1. CAD - VF arrest in setting of acute MI. Emergent cath (4/22) w/ pRCA occlusion s/p PCI + DES. - No s/s angina - Continue ASA 81 mg daily. - Continue Brilinta 90 mg bid. - Continue Crestor 10 mg daily. - Has referral to CR. Encouraged him to go   2. Systolic Heart Failure  - ICM. Echo (4/22) LVEF 20-25%, RV moderately reduced. - Echo today EF 55-60% RV mildly reduced. Personally reviewed - NYHA II.  Volume status  low - Continue Entresto 24/26 mg bid. - Continue Farxiga 10 mg daily. - Has been having orthostasis and AKI. Suspect fluid low at baseline and now worse with recent infections. K has been high  - Stop spiro, imdur, hydralazine completely - Hold Entresto and Farxiga for 3 days - Continue carvedilol 3.125 mg bid. - Labs next week  3. CKD IIIb-IV - Baseline SCr ~1.8-2.2 - Has been referred to [facility] - Changes as above   4. Anoxic brain injury - Improved. - He has completed HH PT/OT/SLP. - PCP has cleared him to drive.  Total time spent 35 minutes. Over half that time spent discussing above.    Glori Bickers, MD  3:36 PM

## 2021-06-11 ENCOUNTER — Telehealth (HOSPITAL_COMMUNITY): Payer: Self-pay

## 2021-06-11 NOTE — Telephone Encounter (Signed)
Pt called wanting to reschedule cardiac rehab. Rescheduled pt cardiac rehab orientation for 07/02/21@9 :30am with exercise session @ 1:15pm.

## 2021-06-13 ENCOUNTER — Other Ambulatory Visit: Payer: Self-pay | Admitting: Family Medicine

## 2021-06-13 ENCOUNTER — Inpatient Hospital Stay (HOSPITAL_COMMUNITY): Admission: RE | Admit: 2021-06-13 | Payer: Medicare PPO | Source: Ambulatory Visit

## 2021-06-13 DIAGNOSIS — N453 Epididymo-orchitis: Secondary | ICD-10-CM | POA: Diagnosis not present

## 2021-06-13 NOTE — Patient Instructions (Addendum)
Hi Magnum,  It was great catching up with you! Don't forget to check in with cardiology about the [facility].   Please reach out to me if you have any questions or need anything before our follow up!   Best, Maddie  Jeni Salles, PharmD, [facility]   Visit Information   Goals Addressed   None    Patient Care Plan: RNCM:Cardiovascular Disease Management(HF, CAD, HTN, HLD)     Problem Identified: Lack of long term management of Cardiovascular Disease Management(HF, CAD, HTN, HLD)   Priority: High     Long-Range Goal: Effective self managment of Cardiovascular Disease(HF, CAD, HTN, HLD)   Start Date: 02/26/2021  Expected End Date: 08/22/2021  This Visit's Progress: On track  Recent Progress: On track  Priority: High  Note:   Current Barriers:  Knowledge deficits related to basic heart failure pathophysiology and self care management, hx of CAD,HTN,HLD Unable to independently self management of Cardiovascular Disease (HF, CAD, HTN, HLD) Unable to perform IADLs independently Pt hospitalized from 01/03/21-02/07/21 with cardiogenic shock and STEMI. Pt lives alone but has a very supportive network of friends and neighbors who have been assisting him with meals, shopping and transportation.-  States he went to the heart failure clinic and they started him on [place].  States his lab check after starting the [name] showed it was effecting his kidney function.  States he had to hold it and his spirolactone for 2 days.  States he does not like how the [place] makes him urinate more especially at night.   States he does his exercises daily.  States he does have some shortness of breath when he is doing his exercise or household chores.  Denies any chest pains.States he is weighing, checking B/P and O2 sat daily.  States his weight has been stable between 174-176.  B/P was 120/67 today.  States he is keeping a log of his readings  and he did get the calendar book RNCM sent him.  States he has been following a low sodium diet and trying to eat more vegetables and fruits. States he getting his medications delivered from [facility] without issue Nurse Case Manager Clinical Goal(s):  patient will weigh self daily and record patient will verbalize understanding of Heart Failure Action Plan and when to call doctor patient will take all Heart Failure mediations as prescribed Interventions:  Collaboration with [name], MD regarding development and update of comprehensive plan of care as evidenced by provider attestation and co-signature Inter-disciplinary care team collaboration (see longitudinal plan of care) Reviewed upcoming provider visits-Heart and vascular 06/10/21, Dr, Sarajane Jews 05/30/21, CCM-pharmacist 06/06/21 Reviewed basic overview and discussion of pathophysiology of Heart Failure, HTN,HLD Reinforced to follow a low sodium diet Reinforced Heart Failure Action Plan Reinforced importance of daily weight Reinforced role of diuretics in prevention of fluid overload Reinforced how to use NTG tablets as needed and when to call 911 Encouraged to discuss with heart failure clinic about use of Entresto Reviewed to progress his activity as tolerated and to avoid extremes in temperature   Self-Care Activities:  Takes Heart Failure Medications as prescribed Weighs daily and record (notifying MD of 3 lb weight gain over night or 5 lb in a week) Verbalizes understanding of and follows CHF Action Plan Adheres to low sodium diet  Patient Goals:  - Take Heart Failure Medications as prescribed - Weigh daily and record (notify MD with 3 lb weight gain over night or 5 lb in  a week) - Follow CHF Action Plan - Adhere to low sodium diet - begin a heart failure diary - bring diary to all appointments - eat more whole grains, fruits and vegetables, lean meats and healthy fats - follow rescue plan if symptoms flare-up - know when to call  the doctor - track symptoms and what helps feel better or worse - dress right for the weather, hot or cold - follow activity or exercise plan - check blood pressure daily - choose a place to take my blood pressure (home, clinic or office, retail store) - write blood pressure results in a log or diary   -Take B/P medications as ordered -Plan to follow a low salt diet  -Increase activity as tolerated  Follow Up Plan: Telephone follow up appointment with care management team member scheduled for: 06/21/21 at 10 AM The patient has been provided with contact information for the care management team and has been advised to call with any health related questions or concerns.      Patient Care Plan: CCM Pharmacy Care Plan     Problem Identified: Problem: Hypertension, Hyperlipidemia, Heart Failure, Coronary Artery Disease, GERD, Depression, Anxiety, and BPH      Long-Range Goal: Patient-Specific Goal   Start Date: 03/07/2021  Expected End Date: 03/07/2022  Recent Progress: On track  Priority: High  Note:   Current Barriers:  Unable to independently monitor therapeutic efficacy  Pharmacist Clinical Goal(s):  Patient will achieve adherence to monitoring guidelines and medication adherence to achieve therapeutic efficacy through collaboration with PharmD and provider.   Interventions: 1:1 collaboration with [name], MD regarding development and update of comprehensive plan of care as evidenced by provider attestation and co-signature Inter-disciplinary care team collaboration (see longitudinal plan of care) Comprehensive medication review performed; medication list updated in electronic medical record  Hypertension (BP goal <130/80) -Controlled -Current treatment: Carvedilol 3.125 mg 1 tablet twice daily Entresto 24-26 mg 1 tablet twice daily - taking once daily Spironolactone 25 mg 1/2 tablet daily Hydralazine 25 mg 1 tablet once daily -Medications previously tried: losartan  (replaced by [name]) -Current home readings: 120-130s/70s -Current dietary habits: strictly limiting salt intake  -Current exercise habits: did not discuss -Denies hypotensive/hypertensive symptoms -Educated on BP goals and benefits of medications for prevention of heart attack, stroke and kidney damage; Exercise goal of 150 minutes per week; Importance of home blood pressure monitoring; Proper BP monitoring technique; -Counseled to monitor BP at home weekly, document, and provide log at future appointments -Counseled on diet and exercise extensively Recommended to continue current medication  Hyperlipidemia: (LDL goal < 70) -Not ideally controlled -Current treatment: Rosuvastatin 10 mg 1 tablet daily -Medications previously tried: none  -Current dietary patterns: not eating fried foods -Current exercise habits: PT exercises -Educated on Cholesterol goals;  Benefits of statin for ASCVD risk reduction; Exercise goal of 150 minutes per week; -Counseled on diet and exercise extensively Recommended to continue current medication Recommended repeat lipid panel  Heart Failure (Goal: manage symptoms and prevent exacerbations) -Controlled -Last ejection fraction: 25-30% (Date: 4/22) -HF type: Systolic -NYHA Class: II (slight limitation of activity) -AHA HF Stage: B (Heart disease present - no symptoms present) -Current treatment: Farxiga 10 mg 1 tablet before breakfast Furosemide 40 mg 1 tablet as needed Carvedilol 3.125 mg 1 tablet twice daily Entresto 24-26 mg 1 tablet twice daily - taking once daily Spironolactone 25 mg 1/2 tablet daily Hydralazine 25 mg 1 tablet once daily -Medications previously tried: none  -Current home  BP/HR readings: 120-130s/70s -Current dietary habits: started a salt diary and not going over 1000 mg per day since starting -Current exercise habits: PT exercises every day; walking dog every morning -Educated on Benefits of medications for managing  symptoms and prolonging life Importance of weighing daily; if you gain more than 3 pounds in one day or 5 pounds in one week, call cardiologist Proper diuretic administration and potassium supplementation -Counseled on diet and exercise extensively Recommended to continue current medication  CAD/STEMI (Goal: prevent future cardiac events) -Controlled -Current treatment  Aspirin 81 mg 1 tablet daily Brilinta 90 mg 1 tablet twice daily Isosorbide mononitrate 30 mg 1 tablet daily -Medications previously tried: n/a  -Recommended to continue current medication Counseled on difference between isosorbide and nitroglycerin. Patient is aware to avoid NSAIDs and to monitor for bleeding  Depression/Anxiety (Goal: minimize symptoms) -Not ideally controlled -Current treatment: Lorazepam 1 mg as needed -Medications previously tried/failed: Cymbalta  -PHQ9: 0 -GAD7: n/a -Educated on Benefits of medication for symptom control Benefits of cognitive-behavioral therapy with or without medication -Recommended to continue current medication Counseled on risks of long term use of benzodiazepines  GERD (Goal: minimize symptoms) -Controlled -Current treatment  Omeprazole 20 mg 1 capsule daily as needed -Medications previously tried: none  -Recommended to continue current medication  BPH (Goal: minimize symptoms) -Controlled -Current treatment  Finasteride 5 mg 1 tablet daily -Medications previously tried: none  -Recommended to continue current medication   Health Maintenance -Vaccine gaps: shingrix, COVID booster -Current therapy:  Acetaminophen 500 mg as needed -Educated on Cost vs benefit of each product must be carefully weighed by individual consumer -Patient is satisfied with current therapy and denies issues -Recommended to continue current medication Educated on if he wanted to restart melatonin he could with a low dose  Patient Goals/Self-Care Activities Patient will:  - take  medications as prescribed check blood pressure at least weekly, document, and provide at future appointments target a minimum of 150 minutes of moderate intensity exercise weekly  Follow Up Plan: Telephone follow up appointment with care management team member scheduled for: 6 months       Patient verbalizes understanding of instructions provided today and agrees to view in [place].  Telephone follow up appointment with pharmacy team member scheduled for: 6 months  Viona Gilmore, [facility]

## 2021-06-14 ENCOUNTER — Telehealth: Payer: Self-pay | Admitting: Pharmacist

## 2021-06-14 NOTE — Chronic Care Management (AMB) (Signed)
Chronic Care Management Pharmacy Assistant   Name: [name]  MRN: [id number] DOB: 1951/06/25   Reason for Encounter: Medication Review / Monthly Pharmacy Call   Conditions to be addressed/monitored: HTN, DMII, and CKD Stage 3   Recent office visits:  None  Recent consult visits:  06/10/2021 Bensimhon, Shaune Pascal, MD (Cardiology) - Patient was seen for Stage 3 chronic kidney disease and additional issues. No medication changes. Follow up in 2 months.  Hospital visits:  Patient was seen at [facility] on 06/09/2021 for  1 hour. Patient was seen for Orchitis and epididymitis. Started on Hydrocodone-Acetaminophen 5/325 mg and Levofloxacin 500 mg. No follow up noted.None  Medications: Outpatient Encounter Medications as of 06/14/2021  Medication Sig Note   acetaminophen (TYLENOL) 500 MG tablet Take 1,000 mg by mouth every 6 (six) hours as needed for mild pain.    aspirin 81 MG chewable tablet Chew 1 tablet (81 mg total) by mouth daily.    BRILINTA 90 MG TABS tablet TAKE ONE TABLET BY MOUTH EVERY MORNING and TAKE ONE TABLET BY MOUTH EVERY EVENING    carvedilol (COREG) 3.125 MG tablet Take 1 tablet (3.125 mg total) by mouth 2 (two) times daily with a meal.    dapagliflozin propanediol (FARXIGA) 10 MG TABS tablet Take 1 tablet (10 mg total) by mouth daily before breakfast.    diclofenac Sodium (VOLTAREN) 1 % GEL Apply 2 g topically 2 (two) times daily as needed (pain).    finasteride ([name]) 5 MG tablet Take 1 tablet (5 mg total) by mouth daily.    HYDROcodone-acetaminophen (NORCO) 5-325 MG tablet Take 1 tablet by mouth every 4 (four) hours as needed for moderate pain.    ketoconazole (NIZORAL) 2 % cream Apply 1 application topically daily as needed for irritation.    levofloxacin (LEVAQUIN) 500 MG tablet Take 1 tablet (500 mg total) by mouth daily for 10 days.    LORazepam (ATIVAN) 2 MG tablet TAKE 1 TABLET 6 HOURS AS NEEDED FOR ANXIETY.  (DOSE CHANGE, FILL 10/14/20 BASED ON PREV PICKUP)    nitroGLYCERIN (NITROSTAT) 0.4 MG SL tablet Place 1 tablet (0.4 mg total) under the tongue every 5 (five) minutes as needed for chest pain.    omeprazole (PRILOSEC) 20 MG capsule Take 1 capsule (20 mg total) by mouth as needed. (Patient taking differently: Take 20 mg by mouth daily as needed (acid reflux).)    rosuvastatin (CRESTOR) 10 MG tablet Take 1 tablet (10 mg total) by mouth daily.    sacubitril-valsartan (ENTRESTO) 24-26 MG Take 1 tablet by mouth 2 (two) times daily. 06/06/2021: Often takes only 1 tablet a day.   triamcinolone cream (KENALOG) 0.1 % Apply 1 application topically 2 (two) times daily as needed (psoriasis).    [DISCONTINUED] tadalafil (CIALIS) 20 MG tablet Take 20 mg by mouth daily as needed.      No facility-administered encounter medications on file as of 06/14/2021.   Fill History:  aspirin 81 mg chewable tablet 03/22/2021 30   carvedilol 3.125 mg tablet 05/17/2021 30   Farxiga 10 mg tablet 05/17/2021 30   finasteride 5 mg tablet 05/17/2021 30   isosorbide mononitrate ER 30 mg tablet,extended release 24 hr 05/17/2021 30   LORAZEPAM 2 MG TABLET 06/01/2021 23   NITROGLYCERIN 0.4 MG TABLET SL 02/14/2021 10   omeprazole 20 mg capsule,delayed release 05/15/2021 30   rosuvastatin 10 mg tablet 05/17/2021 30   Entresto 24 mg-26 mg tablet 04/16/2021 90  spironolactone 25 mg tablet 05/17/2021 30   Brilinta 90 mg tablet 05/17/2021 30   hydralazine 25 mg tablet 05/17/2021 30  Reviewed chart for medication changes ahead of medication coordination call.  BP Readings from Last 3 Encounters:  06/10/21 96/69  06/09/21 125/60  05/30/21 110/74    Lab Results  Component Value Date   HGBA1C 6.0 05/30/2021     Patient obtains medications through Adherence Packaging  30 Days   Last adherence delivery included:   Isosorbide mononitrate (IMDUR) 30 mg 24 hour: one tablet at breakfast Finasteride ([name]) 5 mg: one  tablet before breakfast Spironolactone (ALDACTONE) 25 mg: take (0.5) half tablet at breakfast Carvedilol (COREG) 3.125 mg: one tablet at breakfast and one tablet at dinner Rosuvastatin (CRESTOR) 10 mg: one tablet at breakfast Hydralazine (APRESOLINE) 25 mg: one tablet at breakfast Finasteride ([name]) 5 mg: one tablet before breakfast Ticagrelor (BRILINTA) 90 mg: one tablet at breakfast and one tablet at dinner  Patient declined (meds) last month due to PRN use/additional supply on hand. Explanation of abundance on hand (ie #30 due to overlapping fills or previous adherence issues etc)  Patient is due for next adherence delivery on: 06/25/2021, patient is needing this date changed to 06/26/2021 to be delivered in the 11-3 window, change indicated on pharmacy form. Called patient and reviewed medications and coordinated delivery.  This delivery to include:  Ticagrelor (BRILINTA) 90 mg: one tablet at breakfast and one tablet at dinner Finasteride ([name]) 5 mg: one tablet before breakfast Farxiga 10 mg: one tablet before breakfast Carvedilol (COREG) 3.125 mg: one tablet at breakfast and one tablet at dinner Rosuvastatin (CRESTOR) 10 mg: one tablet at breakfast  Patient declined the following medications Omeprazole as he now takes this PRN and has plenty on hand.  Confirmed delivery date of 06/26/2021, advised patient that pharmacy will contact them the morning of delivery.  Care Gaps:  Zoster Vaccine- Overdue Flu Vaccine - Overdue AWV Done 12-28-2020  Star Rating Drugs:  [facility] 10mg  last filled 05/17/2021 30DS at [facility] Rosuvastatin 10 mg last filled 05/17/2021 at [facility] Entresto 24mg -26mg  last filled 04/16/2021 at [facility] [phone]

## 2021-06-17 ENCOUNTER — Ambulatory Visit (HOSPITAL_COMMUNITY): Payer: Medicare PPO

## 2021-06-18 ENCOUNTER — Ambulatory Visit (HOSPITAL_COMMUNITY)
Admission: RE | Admit: 2021-06-18 | Discharge: 2021-06-18 | Disposition: A | Discharge status: Home / Self Care | Payer: Medicare PPO | Source: Ambulatory Visit | Attending: Internal Medicine | Admitting: Internal Medicine

## 2021-06-18 ENCOUNTER — Other Ambulatory Visit: Payer: Self-pay

## 2021-06-18 DIAGNOSIS — I5022 Chronic systolic (congestive) heart failure: Secondary | ICD-10-CM | POA: Insufficient documentation

## 2021-06-18 LAB — BASIC METABOLIC PANEL
Anion gap: 7 (ref 5–15)
BUN: 31 mg/dL — ABNORMAL HIGH (ref 8–23)
CO2: 25 mmol/L (ref 22–32)
Calcium: 9 mg/dL (ref 8.9–10.3)
Chloride: 105 mmol/L (ref 98–111)
Creatinine, Ser: 2.11 mg/dL — ABNORMAL HIGH (ref 0.61–1.24)
GFR, Estimated: 33 mL/min — ABNORMAL LOW (ref >60)
Glucose, Bld: 112 mg/dL — ABNORMAL HIGH (ref 70–99)
Potassium: 5.2 mmol/L — ABNORMAL HIGH (ref 3.5–5.1)
Sodium: 137 mmol/L (ref 135–145)

## 2021-06-18 LAB — CBC
HCT: 37.6 % — ABNORMAL LOW (ref 39.0–52.0)
Hemoglobin: 12.1 g/dL — ABNORMAL LOW (ref 13.0–17.0)
MCH: 31.3 pg (ref 26.0–34.0)
MCHC: 32.2 g/dL (ref 30.0–36.0)
MCV: 97.2 fL (ref 80.0–100.0)
Platelets: 238 10*3/uL (ref 150–400)
RBC: 3.87 MIL/uL — ABNORMAL LOW (ref 4.22–5.81)
RDW: 12.9 % (ref 11.5–15.5)
WBC: 6.4 10*3/uL (ref 4.0–10.5)
nRBC: 0 % (ref 0.0–0.2)

## 2021-06-18 LAB — BRAIN NATRIURETIC PEPTIDE: B Natriuretic Peptide: 150.7 pg/mL — ABNORMAL HIGH (ref 0.0–100.0)

## 2021-06-19 ENCOUNTER — Ambulatory Visit (HOSPITAL_COMMUNITY): Payer: Medicare PPO

## 2021-06-19 DIAGNOSIS — N453 Epididymo-orchitis: Secondary | ICD-10-CM | POA: Diagnosis not present

## 2021-06-21 ENCOUNTER — Ambulatory Visit: Payer: Medicare PPO

## 2021-06-21 ENCOUNTER — Ambulatory Visit (HOSPITAL_COMMUNITY): Payer: Medicare PPO

## 2021-06-21 DIAGNOSIS — E782 Mixed hyperlipidemia: Secondary | ICD-10-CM

## 2021-06-21 DIAGNOSIS — N1832 Chronic kidney disease, stage 3b: Secondary | ICD-10-CM | POA: Diagnosis not present

## 2021-06-21 DIAGNOSIS — E1122 Type 2 diabetes mellitus with diabetic chronic kidney disease: Secondary | ICD-10-CM | POA: Diagnosis not present

## 2021-06-21 DIAGNOSIS — I10 Essential (primary) hypertension: Secondary | ICD-10-CM | POA: Diagnosis not present

## 2021-06-21 DIAGNOSIS — R570 Cardiogenic shock: Secondary | ICD-10-CM

## 2021-06-21 NOTE — Chronic Care Management (AMB) (Signed)
Chronic Care Management   CCM RN Visit Note  06/21/2021 Name: [name] MRN: [id number] DOB: 1951-08-06  Subjective: [name] is a 70 y.o. year old male who is a primary care patient of [name], MD. The care management team was consulted for assistance with disease management and care coordination needs.    Engaged with patient by telephone for follow up visit in response to provider referral for case management and/or care coordination services.   Consent to Services:  The patient was given information about Chronic Care Management services, agreed to services, and gave verbal consent prior to initiation of services.  Please see initial visit note for detailed documentation.   Patient agreed to services and verbal consent obtained.   Assessment: Review of patient past medical history, allergies, medications, health status, including review of consultants reports, laboratory and other test data, was performed as part of comprehensive evaluation and provision of chronic care management services.   SDOH (Social Determinants of Health) assessments and interventions performed:    CCM Care Plan  Allergies  Allergen Reactions   Codeine Other (See Comments)    hallucinations   Lisinopril Cough    Outpatient Encounter Medications as of 06/21/2021  Medication Sig Note   acetaminophen (TYLENOL) 500 MG tablet Take 1,000 mg by mouth every 6 (six) hours as needed for mild pain.    aspirin 81 MG chewable tablet Chew 1 tablet (81 mg total) by mouth daily.    BRILINTA 90 MG TABS tablet TAKE ONE TABLET BY MOUTH EVERY MORNING and TAKE ONE TABLET BY MOUTH EVERY EVENING    carvedilol (COREG) 3.125 MG tablet Take 1 tablet (3.125 mg total) by mouth 2 (two) times daily with a meal.    dapagliflozin propanediol (FARXIGA) 10 MG TABS tablet Take 1 tablet (10 mg total) by mouth daily before breakfast.    diclofenac Sodium (VOLTAREN) 1 % GEL Apply 2 g topically 2 (two) times daily as  needed (pain).    finasteride ([name]) 5 MG tablet Take 1 tablet (5 mg total) by mouth daily.    HYDROcodone-acetaminophen (NORCO) 5-325 MG tablet Take 1 tablet by mouth every 4 (four) hours as needed for moderate pain.    ketoconazole (NIZORAL) 2 % cream Apply 1 application topically daily as needed for irritation.    LORazepam (ATIVAN) 2 MG tablet TAKE 1 TABLET 6 HOURS AS NEEDED FOR ANXIETY. (DOSE CHANGE, FILL 10/14/20 BASED ON PREV PICKUP)    nitroGLYCERIN (NITROSTAT) 0.4 MG SL tablet Place 1 tablet (0.4 mg total) under the tongue every 5 (five) minutes as needed for chest pain.    omeprazole (PRILOSEC) 20 MG capsule Take 1 capsule (20 mg total) by mouth as needed. (Patient taking differently: Take 20 mg by mouth daily as needed (acid reflux).)    rosuvastatin (CRESTOR) 10 MG tablet Take 1 tablet (10 mg total) by mouth daily.    sacubitril-valsartan (ENTRESTO) 24-26 MG Take 1 tablet by mouth 2 (two) times daily. 06/06/2021: Often takes only 1 tablet a day.   triamcinolone cream (KENALOG) 0.1 % Apply 1 application topically 2 (two) times daily as needed (psoriasis).    [DISCONTINUED] tadalafil (CIALIS) 20 MG tablet Take 20 mg by mouth daily as needed.      No facility-administered encounter medications on file as of 06/21/2021.    Patient Active Problem List   Diagnosis Date Noted   Type 2 diabetes mellitus with diabetic chronic kidney disease ([place]) 05/30/2021   CKD (chronic kidney disease), stage III (  [place]) 05/30/2021   Hypertension    Hemoptysis    Acute hypoxemic respiratory failure (HCC)    Acute renal failure (HCC)    Encephalopathy acute    Cardiogenic shock ([place]) 01/03/2021   Atypical chest pain 10/25/2019   Coronary artery calcification seen on CAT scan 06/15/2019   Nodule of lower lobe of left lung 06/15/2019   Tremor of right hand 04/24/2017   Numbness and tingling of both legs 04/24/2017   Bilateral leg edema 06/05/2015   Chronic neck pain 06/05/2015   Plantar fasciitis  02/01/2014   Visual floaters 04/04/2013   Subjective vision disturbance, left 04/04/2013   BPH with urinary obstruction 01/01/2010   MELANOMA, HX OF 06/01/2009   NECK PAIN 09/11/2008   CELLULITIS AND ABSCESS OF LEG EXCEPT FOOT 04/26/2008   ERECTILE DYSFUNCTION 10/12/2007   Hyperlipemia, mixed 04/12/2007   Anxiety state 04/12/2007   Essential hypertension 04/12/2007   GERD 04/12/2007    Conditions to be addressed/monitored:CHF, CAD, HTN, HLD, and CKD Stage 3  Care Plan : RNCM:Cardiovascular Disease Management(HF, CAD, HTN, HLD)  Updates made by Dimitri Ped, RN since 06/21/2021 12:00 AM     Problem: Lack of long term management of Cardiovascular Disease Management(HF, CAD, HTN, HLD)   Priority: High     Long-Range Goal: Effective self managment of Cardiovascular Disease(HF, CAD, HTN, HLD)   Start Date: 02/26/2021  Expected End Date: 11/20/2021  This Visit's Progress: On track  Recent Progress: On track  Priority: High  Note:   Current Barriers:  Knowledge deficits related to basic heart failure pathophysiology and self care management, hx of CAD,HTN,HLD Unable to independently self management of Cardiovascular Disease (HF, CAD, HTN, HLD) Unable to perform IADLs independently Pt hospitalized from 01/03/21-02/07/21 with cardiogenic shock and STEMI. Pt lives alone but has a very supportive network of friends and neighbors who have been assisting him with meals, shopping and transportation.   States he had a follow up appointment at the heart failure clinic on 06/10/21. States Dr. Haroldine Laws stopped his spirolactone, hydralazine and isosorbide. States he is taking his Entresto twice a day as ordered.  States he saw Dr. Sarajane Jews for his physical and he is sending him to a nephrologist to check on his kidney function .  States he is walking daily and he has not been doing exercises while he was recovering from epididymitis.  States he does have some shortness of breath when he is doing his  exercise or household chores.  Denies any chest pains.States he is weighing, checking B/P and O2 sat daily.  States his weight has been stable between 175-177.  B/P was 121/63 today.  States he is keeping a log of his readings  States he has been following a low sodium diet and trying to eat more vegetables and fruits. States he is following a 2 L/day fluid restriction.  States he is starting cardiac rehab on 07/02/21 Nurse Case Manager Clinical Goal(s):  patient will weigh self daily and record patient will verbalize understanding of Heart Failure Action Plan and when to call doctor patient will take all Heart Failure mediations as prescribed Interventions:  Collaboration with [name], MD regarding development and update of comprehensive plan of care as evidenced by provider attestation and co-signature Inter-disciplinary care team collaboration (see longitudinal plan of care) Reviewed upcoming provider visits-Heart and vascular 08/13/21, CCM-pharmacist 12/03/21 Reviewed basic overview and discussion of pathophysiology of Heart Failure, HTN,HLD Reinforced to follow a low sodium diet Reinforced Heart Failure Action Plan  Reinforced importance of daily weight Reinforced role of diuretics in prevention of fluid overload Reinforced how to use NTG tablets as needed and when to call 911 Reviewed to progress his activity as tolerated and to avoid extremes in temperature   Self-Care Activities:  Takes Heart Failure Medications as prescribed Weighs daily and record (notifying MD of 3 lb weight gain over night or 5 lb in a week) Verbalizes understanding of and follows CHF Action Plan Adheres to low sodium diet  Patient Goals:  - Take Heart Failure Medications as prescribed - Weigh daily and record (notify MD with 3 lb weight gain over night or 5 lb in a week) - Follow CHF Action Plan - Adhere to low sodium diet - begin a heart failure diary - bring diary to all appointments - eat more whole  grains, fruits and vegetables, lean meats and healthy fats - follow rescue plan if symptoms flare-up - know when to call the doctor - track symptoms and what helps feel better or worse - dress right for the weather, hot or cold - follow activity or exercise plan - check blood pressure daily - choose a place to take my blood pressure (home, clinic or office, retail store) - write blood pressure results in a log or diary   -Take B/P medications as ordered -Plan to follow a low salt diet  -Increase activity as tolerated Follow Up Plan: Telephone follow up appointment with care management team member scheduled for: 08/23/21 at 10 AM The patient has been provided with contact information for the care management team and has been advised to call with any health related questions or concerns.       Plan:Telephone follow up appointment with care management team member scheduled for:  08/23/21 and The patient has been provided with contact information for the care management team and has been advised to call with any health related questions or concerns.  Peter Garter RN, Jackquline [place], CDE Care Management Coordinator [facility] [phone], Mobile [phone]

## 2021-06-21 NOTE — Patient Instructions (Signed)
Visit Information  PATIENT GOALS:  Goals Addressed             This Visit's Progress    RNCM:Track and Manage My Blood Pressure-Hypertension   On track    Timeframe:  Long-Range Goal Priority:  Medium Start Date:  02/26/21                           Expected End Date:      11/20/21                 Follow Up Date 08/23/21    - check blood pressure daily - choose a place to take my blood pressure (home, clinic or office, retail store) - write blood pressure results in a log or diary   -Take B/P medications as ordered -Plan to follow a low salt diet  -Increase activity as tolerated Why is this important?   You won't feel high blood pressure, but it can still hurt your blood vessels.  High blood pressure can cause heart or kidney problems. It can also cause a stroke.  Making lifestyle changes like losing a little weight or eating less salt will help.  Checking your blood pressure at home and at different times of the day can help to control blood pressure.  If the doctor prescribes medicine remember to take it the way the doctor ordered.  Call the office if you cannot afford the medicine or if there are questions about it.     Notes:      RNCM:Track and Manage Symptoms-Heart Failure   On track    Timeframe:  Long-Range Goal Priority:  High Start Date:       02/26/21                      Expected End Date:       11/20/21                Follow Up Date 08/23/21    - begin a heart failure diary - develop a rescue plan - eat more whole grains, fruits and vegetables, lean meats and healthy fats - follow rescue plan if symptoms flare-up - know when to call the doctor - track symptoms and what helps feel better or worse - dress right for the weather, hot or cold    Why is this important?   You will be able to handle your symptoms better if you keep track of them.  Making some simple changes to your lifestyle will help.  Eating healthy is one thing you can do to take good care of  yourself.    Notes:       Chronic Kidney Disease, Adult Chronic kidney disease is when lasting damage happens to the kidneys slowly over a long time. The kidneys help to: Make pee (urine). Make hormones. Keep the right amount of fluids and chemicals in the body. Most often, this disease does not go away. You must take steps to help keep the kidney damage from getting worse. If steps are not taken, the kidneys might stop working forever. What are the causes? Diabetes. High blood pressure. Diseases that affect the heart and blood vessels. Other kidney diseases. Diseases of the body's disease-fighting system. A problem with the flow of pee. Infections of the organs that make pee, store it, and take it out of the body. Swelling or irritation of your blood vessels. What increases the risk? Getting  older. Having someone in your family who has kidney disease or kidney failure. Having a disease caused by genes. Taking medicines often that harm the kidneys. Being near or having contact with harmful substances. Being very overweight. Using tobacco now or in the past. What are the signs or symptoms? Feeling very tired. Having a swollen face, legs, ankles, or feet. Feeling like you may vomit or vomiting. Not feeling hungry. Being confused or not able to focus. Twitches and cramps in the leg muscles or other muscles. Dry, itchy skin. A taste of metal in your mouth. Making less pee, or making more pee. Shortness of breath. Trouble sleeping. You may also become anemic or get weak bones. Anemic means there is not enough red blood cells or hemoglobin in your blood. You may get symptoms slowly. You may not notice them until the kidney damage gets very bad. How is this treated? Often, there is no cure for this disease. Treatment can help with symptoms and help keep the disease from getting worse. You may need to: Avoid alcohol. Avoid foods that are high in salt, potassium, phosphorous, and  protein. Take medicines for symptoms and to help control other conditions. Have dialysis. This treatment gets harmful waste out of your body. Treat other problems that cause your kidney disease or make it worse. Follow these instructions at home: Medicines Take over-the-counter and prescription medicines only as told by your doctor. Do not take any new medicines, vitamins, or supplements unless your doctor says it is okay. Lifestyle  Do not smoke or use any products that contain nicotine or tobacco. If you need help quitting, ask your doctor. If you drink alcohol: Limit how much you use to: 0-1 drink a day for women who are not pregnant. 0-2 drinks a day for men. Know how much alcohol is in your drink. In the [place] one drink equals one 12 oz bottle of beer (355 mL), one 5 oz glass of wine (148 mL), or one 1 oz glass of hard liquor (44 mL). Stay at a healthy weight. If you need help losing weight, ask your doctor. General instructions  Follow instructions from your doctor about what you cannot eat or drink. Track your blood pressure at home. Tell your doctor about any changes. If you have diabetes, track your blood sugar. Exercise at least 30 minutes a day, 5 days a week. Keep your shots (vaccinations) up to date. Keep all follow-up visits. Where to find more information American Association of Kidney Patients: BombTimer.gl National Kidney Foundation: www.kidney.[place]: https://mathis.com/ Life Options: www.lifeoptions.org Kidney School: www.kidneyschool.org Contact a doctor if: Your symptoms get worse. You get new symptoms. Get help right away if: You get symptoms of end-stage kidney disease. These include: Headaches. Losing feeling in your hands or feet. Easy bruising. Having hiccups often. Chest pain. Shortness of breath. Lack of menstrual periods, in women. You have a fever. You make less pee than normal. You have pain or you bleed when you pee or  poop. These symptoms may be an emergency. Get help right away. Call your local emergency services (911 in the [place] Do not wait to see if the symptoms will go away. Do not drive yourself to the hospital. Summary Chronic kidney disease is when lasting damage happens to the kidneys slowly over a long time. Causes of this disease include diabetes and high blood pressure. Often, there is no cure for this disease. Treatment can help symptoms and help keep the disease from getting worse. Treatment  may involve lifestyle changes, medicines, and dialysis. This information is not intended to replace advice given to you by your health care provider. Make sure you discuss any questions you have with your health care provider. Document Revised: 12/14/2019 Document Reviewed: 12/14/2019 Elsevier Patient Education  2022 [place].   Patient verbalizes understanding of instructions provided today and agrees to view in [place].   Telephone follow up appointment with care management team member scheduled for: 08/23/21 at 10 AM Peter Garter RN, [facility], CDE Care Management Coordinator [facility] [phone], Mobile [phone]

## 2021-06-24 ENCOUNTER — Ambulatory Visit (HOSPITAL_COMMUNITY): Payer: Medicare PPO

## 2021-06-26 ENCOUNTER — Ambulatory Visit (HOSPITAL_COMMUNITY): Payer: Medicare PPO

## 2021-06-27 ENCOUNTER — Telehealth (HOSPITAL_COMMUNITY): Payer: Self-pay | Admitting: *Deleted

## 2021-06-28 ENCOUNTER — Ambulatory Visit (HOSPITAL_COMMUNITY): Payer: Medicare PPO

## 2021-07-01 ENCOUNTER — Ambulatory Visit (HOSPITAL_COMMUNITY): Payer: Medicare PPO

## 2021-07-02 ENCOUNTER — Other Ambulatory Visit: Payer: Self-pay

## 2021-07-02 ENCOUNTER — Encounter (HOSPITAL_COMMUNITY)
Admission: RE | Admit: 2021-07-02 | Discharge: 2021-07-02 | Disposition: A | Discharge status: Home / Self Care | Payer: Medicare PPO | Source: Ambulatory Visit | Attending: Internal Medicine | Admitting: Internal Medicine

## 2021-07-02 VITALS — BP 110/70 | HR 65 | Ht 72.0 in | Wt 176.6 lb

## 2021-07-02 DIAGNOSIS — I2111 ST elevation (STEMI) myocardial infarction involving right coronary artery: Secondary | ICD-10-CM

## 2021-07-02 DIAGNOSIS — Z955 Presence of coronary angioplasty implant and graft: Secondary | ICD-10-CM | POA: Insufficient documentation

## 2021-07-02 DIAGNOSIS — I213 ST elevation (STEMI) myocardial infarction of unspecified site: Secondary | ICD-10-CM | POA: Insufficient documentation

## 2021-07-02 NOTE — Progress Notes (Signed)
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, Disp: 180 tablet, Rfl: 1   dapagliflozin propanediol (FARXIGA) 10 MG TABS tablet, Take 1 tablet (10 mg total) by mouth daily before breakfast., Disp: 30 tablet, Rfl: 11   diclofenac Sodium (VOLTAREN) 1 % GEL, Apply 2 g topically 2 (two) times daily as needed (pain)., Disp: , Rfl:    finasteride ([name]) 5 MG tablet, Take 1 tablet (5 mg total) by mouth daily., Disp: 90 tablet, Rfl: 0   ketoconazole (NIZORAL) 2 % cream, Apply 1 application topically daily as needed for irritation., Disp: , Rfl:    LORazepam (ATIVAN) 2 MG tablet, TAKE 1 TABLET 6 HOURS AS NEEDED FOR ANXIETY. (DOSE CHANGE, FILL 10/14/20 BASED ON PREV PICKUP), Disp: 90 tablet, Rfl: 5   nitroGLYCERIN (NITROSTAT) 0.4 MG SL tablet, Place 1 tablet (0.4 mg total) under the tongue every 5 (five) minutes as  needed for chest pain., Disp: 50 tablet, Rfl: 3   omeprazole (PRILOSEC) 20 MG capsule, Take 1 capsule (20 mg total) by mouth as needed. (Patient taking differently: Take 20 mg by mouth daily as needed (acid reflux).), Disp: 30 capsule, Rfl: 11   rosuvastatin (CRESTOR) 10 MG tablet, Take 1 tablet (10 mg total) by mouth daily., Disp: 90 tablet, Rfl: 3   sacubitril-valsartan (ENTRESTO) 24-26 MG, Take 1 tablet by mouth 2 (two) times daily., Disp: 60 tablet, Rfl: 11   triamcinolone cream (KENALOG) 0.1 %, Apply 1 application topically 2 (two) times daily as needed (psoriasis)., Disp: , Rfl:    HYDROcodone-acetaminophen (NORCO) 5-325 MG tablet, Take 1 tablet by mouth every 4 (four) hours as needed for moderate pain. (Patient not taking: Reported on 06/26/2021), Disp: 6 tablet, Rfl: 0  Past Medical History: Past Medical History:  Diagnosis Date   Anxiety    Arthritis    Cancer ([place]) 2010   basal cell ca, head   Cataract    bilateral   Depression    ED (erectile dysfunction)    GERD (gastroesophageal reflux disease)    Hyperlipidemia    Hypertension    Kidney stone 07-29-11   passed    Neuromuscular disorder ([place])    neuropathy legs   Vitreous floater    left eye, sees Dr. Dawna Part at [facility] Use: Social History   Tobacco Use  Smoking Status Never  Smokeless Tobacco Never    Labs: Recent Review Flowsheet Data     Labs for ITP Cardiac and Pulmonary Rehab Latest Ref Rng & Units 01/22/2021 01/23/2021 01/24/2021 01/25/2021 05/30/2021   Cholestrol 0 - 200 mg/dL - - - - 119   LDLCALC 0 - 99 mg/dL - - - - 56   LDLDIRECT mg/dL - - - - -   HDL >39.00 mg/dL - - - - 45.10   Trlycerides 0.0 - 149.0 mg/dL - - - - 90.0   Hemoglobin A1c 4.6 - 6.5 % - - - - 6.0   PHART 7.350 - 7.450 - - - - -   PCO2ART 32.0 - 48.0 mmHg - - - - -   HCO3 20.0 - 28.0 mmol/L - - - - -   TCO2 22 - 32 mmol/L - - - - -   ACIDBASEDEF 0.0 - 2.0 mmol/L - - - - -   O2SAT % 74.2 70.8 68.0 62.4 -        Capillary Blood Glucose: Lab Results  Component Value Date   GLUCAP 92 02/07/2021   GLUCAP 86 02/07/2021   GLUCAP 96 02/06/2021   GLUCAP 132 (H) 02/06/2021   GLUCAP 94 02/06/2021     Exercise Target Goals: Exercise Program Goal: Individual exercise prescription set using results from initial 6 min walk test and THRR while considering  patient's activity barriers and safety.   Exercise Prescription Goal: Initial exercise prescription builds to 30-45 minutes a day of aerobic activity, 2-3 days per week.  Home exercise guidelines will be given to patient during program as part of exercise prescription that the participant will acknowledge.  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 HR 89 bpm     Max Ex. BP 136/68     2 Minute Post BP 120/70              Oxygen Initial Assessment:   Oxygen Re-Evaluation:   Oxygen Discharge (Final Oxygen Re-Evaluation):   Initial Exercise Prescription:  Initial Exercise Prescription - 07/02/21 1500       Date of Initial Exercise RX and Referring Provider   Date 07/02/21    Referring Provider Glori Bickers, MD    Expected Discharge Date 09/06/21      NuStep   Level 2    SPM 75    Minutes 15  METs 2      Arm Ergometer   Level 1.5    RPM 60    Minutes 15    METs 2      Prescription Details   Frequency (times per week) 3    Duration Progress to 30 minutes of continuous aerobic without signs/symptoms of physical distress      Intensity   THRR 40-80% of Max Heartrate 60-120    Ratings of Perceived Exertion 11-13    Perceived Dyspnea 0-4      Progression   Progression Continue progressive overload as per policy without signs/symptoms or physical distress.      Resistance Training   Training Prescription Yes    Weight 3 lbs    Reps 10-15             Perform Capillary Blood Glucose checks as needed.  Exercise Prescription Changes:   Exercise Comments:   Exercise Goals and Review:   Exercise Goals     Row Name 07/02/21 1523             Exercise Goals   Increase Physical Activity Yes       Intervention Provide advice, education, support and counseling about physical activity/exercise needs.;Develop an individualized exercise prescription for aerobic and resistive training based on initial evaluation findings, risk stratification, comorbidities and participant's personal goals.       Expected Outcomes Short Term: Attend rehab on a regular basis to increase amount of physical activity.;Long Term: Add in home exercise to make exercise part of routine and to increase amount of physical activity.;Long Term: Exercising regularly at least 3-5 days a week.       Increase Strength and Stamina Yes       Intervention Provide advice, education, support and counseling about physical activity/exercise needs.;Develop an individualized exercise prescription for aerobic and resistive training based on initial evaluation findings, risk stratification, comorbidities and participant's personal goals.       Expected Outcomes Short Term: Increase workloads from initial exercise prescription for resistance, speed, and METs.;Short Term: Perform resistance training exercises  routinely during rehab and add in resistance training at home;Long Term: Improve cardiorespiratory fitness, muscular endurance and strength as measured by increased METs and functional capacity (6MWT)       Able to understand and use rate of perceived exertion (RPE) scale Yes       Intervention Provide education and explanation on how to use RPE scale       Expected Outcomes Long Term:  Able to use RPE to guide intensity level when exercising independently;Short Term: Able to use RPE daily in rehab to express subjective intensity level       Knowledge and understanding of Target Heart Rate Range (THRR) Yes       Intervention Provide education and explanation of THRR including how the numbers were predicted and where they are located for reference       Expected Outcomes Short Term: Able to state/look up THRR;Long Term: Able to use THRR to govern intensity when exercising independently;Short Term: Able to use daily as guideline for intensity in rehab       Understanding of Exercise Prescription Yes       Intervention Provide education, explanation, and written materials on patient's individual exercise prescription       Expected Outcomes Short Term: Able to explain program exercise prescription;Long Term: Able to explain home exercise prescription to exercise independently  Exercise Goals Re-Evaluation :   Discharge Exercise Prescription (Final Exercise Prescription Changes):   Nutrition:  Target Goals: Understanding of nutrition guidelines, daily intake of sodium 1500mg , cholesterol 200mg , calories 30% from fat and 7% or less from saturated fats, daily to have 5 or more servings of fruits and vegetables.  Biometrics:  Pre Biometrics - 07/02/21 1315       Pre Biometrics   Waist Circumference 38.75 inches    Hip Circumference 39 inches    Waist to Hip Ratio 0.99 %    Triceps Skinfold 11 mm    % Body Fat 22.5 %    Grip Strength 40 kg    Flexibility 0 in   unable to  reach   Single Leg Stand 20.93 seconds              Nutrition Therapy Plan and Nutrition Goals:   Nutrition Assessments:  MEDIFICTS Score Key: ?70 Need to make dietary changes  40-70 Heart Healthy Diet ? 40 Therapeutic Level Cholesterol Diet    Picture Your Plate Scores: <34 Unhealthy dietary pattern with much room for improvement. 41-50 Dietary pattern unlikely to meet recommendations for good health and room for improvement. 51-60 More healthful dietary pattern, with some room for improvement.  >60 Healthy dietary pattern, although there may be some specific behaviors that could be improved.    Nutrition Goals Re-Evaluation:   Nutrition Goals Re-Evaluation:   Nutrition Goals Discharge (Final Nutrition Goals Re-Evaluation):   Psychosocial: Target Goals: Acknowledge presence or absence of significant depression and/or stress, maximize coping skills, provide positive support system. Participant is able to verbalize types and ability to use techniques and skills needed for reducing stress and depression.  Initial Review & Psychosocial Screening:  Initial Psych Review & Screening - 07/02/21 1613       Initial Review   Current issues with History of Depression;Current Anxiety/Panic;Current Sleep Concerns      Family Dynamics   Comments [name] is able to deal with his anxiety with the use of the medication - Ativan,  Toby, [name]'s dog, provides emotional support.  Uses the technique of pacing and wringing his hands.      Barriers   Psychosocial barriers to participate in program The patient should benefit from training in stress management and relaxation.;Psychosocial barriers identified (see note)      Screening Interventions   Interventions Encouraged to exercise;Provide feedback about the scores to participant    Expected Outcomes Short Term goal: Identification and review with participant of any Quality of Life or Depression concerns found by scoring the  questionnaire.;Long Term goal: The participant improves quality of Life and PHQ9 Scores as seen by post scores and/or verbalization of changes             Quality of Life Scores:  Quality of Life - 07/02/21 1517       Quality of Life   Select Quality of Life      Quality of Life Scores   Health/Function Pre 26.13 %    Socioeconomic Pre 24.79 %    Psych/Spiritual Pre 23.79 %    Family Pre 21.33 %    GLOBAL Pre 24.88 %            Scores of 19 and below usually indicate a poorer quality of life in these areas.  A difference of  2-3 points is a clinically meaningful difference.  A difference of 2-3 points in the total score of the Quality of Life Index has been  associated with significant improvement in overall quality of life, self-image, physical symptoms, and general health in studies assessing change in quality of life.  PHQ-9: Recent Review Flowsheet Data     Depression screen [facility] 2/9 07/02/2021 05/30/2021 02/26/2021 12/28/2020 12/28/2020   Decreased Interest 0 0 0 0 0   Down, Depressed, Hopeless 0 0 0 0 0   PHQ - 2 Score 0 0 0 0 0   Altered sleeping 2 1 - - -   Tired, decreased energy 1 0 - - -   Change in appetite 1 0 - - -   Feeling bad or failure about yourself  0 0 - - -   Trouble concentrating 0 0 - - -   Moving slowly or fidgety/restless 1 0 - - -   Suicidal thoughts 0 0 - - -   PHQ-9 Score 5 1 - - -   Difficult doing work/chores Somewhat difficult Somewhat difficult - - -      Interpretation of Total Score  Total Score Depression Severity:  1-4 = Minimal depression, 5-9 = Mild depression, 10-14 = Moderate depression, 15-19 = Moderately severe depression, 20-27 = Severe depression   Psychosocial Evaluation and Intervention:   Psychosocial Re-Evaluation:   Psychosocial Discharge (Final Psychosocial Re-Evaluation):   Vocational Rehabilitation: Provide vocational rehab assistance to qualifying candidates.   Vocational Rehab Evaluation & Intervention:   Vocational Rehab - 07/02/21 1616       Initial Vocational Rehab Evaluation & Intervention   Assessment shows need for Vocational Rehabilitation No             Education: Education Goals: Education classes will be provided on a weekly basis, covering required topics. Participant will state understanding/return demonstration of topics presented.  Learning Barriers/Preferences:  Learning Barriers/Preferences - 07/02/21 1518       Learning Barriers/Preferences   Learning Barriers Sight   wears glasses   Learning Preferences Audio;Computer/Internet;Group Instruction;Individual Instruction;Pictoral;Skilled Demonstration;Verbal Instruction;Video;Written Material             Education Topics: Count Your Pulse:  -Group instruction provided by verbal instruction, demonstration, patient participation and written materials to support subject.  Instructors address importance of being able to find your pulse and how to count your pulse when at home without a heart monitor.  Patients get hands on experience counting their pulse with staff help and individually.   Heart Attack, Angina, and Risk Factor Modification:  -Group instruction provided by verbal instruction, video, and written materials to support subject.  Instructors address signs and symptoms of angina and heart attacks.    Also discuss risk factors for heart disease and how to make changes to improve heart health risk factors.   Functional Fitness:  -Group instruction provided by verbal instruction, demonstration, patient participation, and written materials to support subject.  Instructors address safety measures for doing things around the house.  Discuss how to get up and down off the floor, how to pick things up properly, how to safely get out of a chair without assistance, and balance training.   Meditation and Mindfulness:  -Group instruction provided by verbal instruction, patient participation, and written materials to  support subject.  Instructor addresses importance of mindfulness and meditation practice to help reduce stress and improve awareness.  Instructor also leads participants through a meditation exercise.    Stretching for Flexibility and Mobility:  -Group instruction provided by verbal instruction, patient participation, and written materials to support subject.  Instructors lead participants through series of stretches that  are designed to increase flexibility thus improving mobility.  These stretches are additional exercise for major muscle groups that are typically performed during regular warm up and cool down.   Hands Only CPR:  -Group verbal, video, and participation provides a basic overview of AHA guidelines for community CPR. Role-play of emergencies allow participants the opportunity to practice calling for help and chest compression technique with discussion of AED use.   Hypertension: -Group verbal and written instruction that provides a basic overview of hypertension including the most recent diagnostic guidelines, risk factor reduction with self-care instructions and medication management.    Nutrition I class: Heart Healthy Eating:  -Group instruction provided by PowerPoint slides, verbal discussion, and written materials to support subject matter. The instructor gives an explanation and review of the Therapeutic Lifestyle Changes diet recommendations, which includes a discussion on lipid goals, dietary fat, sodium, fiber, plant stanol/sterol esters, sugar, and the components of a well-balanced, healthy diet.   Nutrition II class: Lifestyle Skills:  -Group instruction provided by PowerPoint slides, verbal discussion, and written materials to support subject matter. The instructor gives an explanation and review of label reading, grocery shopping for heart health, heart healthy recipe modifications, and ways to make healthier choices when eating out.   Diabetes Question & Answer:   -Group instruction provided by PowerPoint slides, verbal discussion, and written materials to support subject matter. The instructor gives an explanation and review of diabetes co-morbidities, pre- and post-prandial blood glucose goals, pre-exercise blood glucose goals, signs, symptoms, and treatment of hypoglycemia and hyperglycemia, and foot care basics.   Diabetes Blitz:  -Group instruction provided by PowerPoint slides, verbal discussion, and written materials to support subject matter. The instructor gives an explanation and review of the physiology behind type 1 and type 2 diabetes, diabetes medications and rational behind using different medications, pre- and post-prandial blood glucose recommendations and Hemoglobin A1c goals, diabetes diet, and exercise including blood glucose guidelines for exercising safely.    Portion Distortion:  -Group instruction provided by PowerPoint slides, verbal discussion, written materials, and food models to support subject matter. The instructor gives an explanation of serving size versus portion size, changes in portions sizes over the last 20 years, and what consists of a serving from each food group.   Stress Management:  -Group instruction provided by verbal instruction, video, and written materials to support subject matter.  Instructors review role of stress in heart disease and how to cope with stress positively.     Exercising on Your Own:  -Group instruction provided by verbal instruction, power point, and written materials to support subject.  Instructors discuss benefits of exercise, components of exercise, frequency and intensity of exercise, and end points for exercise.  Also discuss use of nitroglycerin and activating EMS.  Review options of places to exercise outside of rehab.  Review guidelines for sex with heart disease.   Cardiac Drugs I:  -Group instruction provided by verbal instruction and written materials to support subject.   Instructor reviews cardiac drug classes: antiplatelets, anticoagulants, beta blockers, and statins.  Instructor discusses reasons, side effects, and lifestyle considerations for each drug class.   Cardiac Drugs II:  -Group instruction provided by verbal instruction and written materials to support subject.  Instructor reviews cardiac drug classes: angiotensin converting enzyme inhibitors (ACE-I), angiotensin II receptor blockers (ARBs), nitrates, and calcium channel blockers.  Instructor discusses reasons, side effects, and lifestyle considerations for each drug class.   Anatomy and Physiology of the Circulatory System:  Group verbal  and written instruction and models provide basic cardiac anatomy and physiology, with the coronary electrical and arterial systems. Review of: AMI, Angina, Valve disease, Heart Failure, Peripheral Artery Disease, Cardiac Arrhythmia, Pacemakers, and the ICD.   Other Education:  -Group or individual verbal, written, or video instructions that support the educational goals of the cardiac rehab program.   Holiday Eating Survival Tips:  -Group instruction provided by PowerPoint slides, verbal discussion, and written materials to support subject matter. The instructor gives patients tips, tricks, and techniques to help them not only survive but enjoy the holidays despite the onslaught of food that accompanies the holidays.   Knowledge Questionnaire Score:  Knowledge Questionnaire Score - 07/02/21 1518       Knowledge Questionnaire Score   Pre Score 20/24             Core Components/Risk Factors/Patient Goals at Admission:  Personal Goals and Risk Factors at Admission - 07/02/21 1519       Core Components/Risk Factors/Patient Goals on Admission    Weight Management Weight Maintenance    Heart Failure Yes    Intervention Provide a combined exercise and nutrition program that is supplemented with education, support and counseling about heart failure.  Directed toward relieving symptoms such as shortness of breath, decreased exercise tolerance, and extremity edema.    Expected Outcomes Long term: Adoption of self-care skills and reduction of barriers for early signs and symptoms recognition and intervention leading to self-care maintenance.;Short term: Daily weights obtained and reported for increase. Utilizing diuretic protocols set by physician.;Short term: Attendance in program 2-3 days a week with increased exercise capacity. Reported lower sodium intake. Reported increased fruit and vegetable intake. Reports medication compliance.;Improve functional capacity of life    Hypertension Yes    Intervention Provide education on lifestyle modifcations including regular physical activity/exercise, weight management, moderate sodium restriction and increased consumption of fresh fruit, vegetables, and low fat dairy, alcohol moderation, and smoking cessation.;Monitor prescription use compliance.    Expected Outcomes Short Term: Continued assessment and intervention until BP is < 140/74mm HG in hypertensive participants. < 130/50mm HG in hypertensive participants with diabetes, heart failure or chronic kidney disease.;Long Term: Maintenance of blood pressure at goal levels.    Lipids Yes    Intervention Provide education and support for participant on nutrition & aerobic/resistive exercise along with prescribed medications to achieve LDL 70mg , HDL >40mg .    Expected Outcomes Short Term: Participant states understanding of desired cholesterol values and is compliant with medications prescribed. Participant is following exercise prescription and nutrition guidelines.;Long Term: Cholesterol controlled with medications as prescribed, with individualized exercise RX and with personalized nutrition plan. Value goals: LDL < 70mg , HDL > 40 mg.             Core Components/Risk Factors/Patient Goals Review:    Core Components/Risk Factors/Patient Goals at  Discharge (Final Review):    ITP Comments:   Comments: Patient attended orientation on 07/02/2021 to review rules and guidelines for program.  Completed 6 minute walk test, Intitial ITP, and exercise prescription.  VSS. Telemetry-SR.  Asymptomatic. Safety measures and social distancing in place per CDC guidelines. Cherre Huger, BSN Cardiac and Training and development officer

## 2021-07-03 ENCOUNTER — Ambulatory Visit (HOSPITAL_COMMUNITY): Payer: Medicare PPO

## 2021-07-05 ENCOUNTER — Ambulatory Visit (HOSPITAL_COMMUNITY): Payer: Medicare PPO

## 2021-07-08 ENCOUNTER — Ambulatory Visit (HOSPITAL_COMMUNITY): Payer: Medicare PPO

## 2021-07-08 ENCOUNTER — Encounter (HOSPITAL_COMMUNITY)
Admission: RE | Admit: 2021-07-08 | Discharge: 2021-07-08 | Disposition: A | Discharge status: Home / Self Care | Payer: Medicare PPO | Source: Ambulatory Visit | Attending: Internal Medicine | Admitting: Internal Medicine

## 2021-07-08 ENCOUNTER — Other Ambulatory Visit: Payer: Self-pay

## 2021-07-08 DIAGNOSIS — I213 ST elevation (STEMI) myocardial infarction of unspecified site: Secondary | ICD-10-CM | POA: Diagnosis not present

## 2021-07-08 DIAGNOSIS — Z955 Presence of coronary angioplasty implant and graft: Secondary | ICD-10-CM | POA: Diagnosis not present

## 2021-07-08 DIAGNOSIS — I2111 ST elevation (STEMI) myocardial infarction involving right coronary artery: Secondary | ICD-10-CM

## 2021-07-08 NOTE — Progress Notes (Signed)
Daily Session Note  Patient Details  Name: [name] MRN: [id number] Date of Birth: 06/21/1951 Referring Provider:   Flowsheet Row CARDIAC REHAB PHASE II ORIENTATION from 07/02/2021 in [place]  Referring Provider Dalton Bickers, MD       Encounter Date: 07/08/2021  Check In:  Session Check In - 07/08/21 1317       Check-In   Supervising physician immediately available to respond to emergencies [facility] immediately available    Physician(s) Dr. Verlon Au    Location [facility]    Staff Present Maurice Small, RN, Milus Glazier, MS, ACSM-CEP, CCRP, Exercise Physiologist;Jetta Gilford Rile BS, ACSM EP-C, Exercise Physiologist;Olinty Allegan, MS, ACSM CEP, Exercise Physiologist;Annedrea Rosezella Florida, RN, MHA;Johnston Maddocks, RN, BSN    Virtual Visit No    Medication changes reported     No    Fall or balance concerns reported    No    Tobacco Cessation No Change    Warm-up and Cool-down Performed on first and last piece of equipment    Resistance Training Performed Yes    VAD Patient? No    PAD/SET Patient? No      Pain Assessment   Currently in Pain? No/denies    Pain Score 0-No pain    Multiple Pain Sites No             Capillary Blood Glucose: No results found for this or any previous visit (from the past 24 hour(s)).   Exercise Prescription Changes - 07/08/21 1400       Response to Exercise   Blood Pressure (Admit) 130/80    Blood Pressure (Exercise) 124/72    Blood Pressure (Exit) 118/70    Heart Rate (Admit) 65 bpm    Heart Rate (Exercise) 75 bpm    Heart Rate (Exit) 57 bpm    Rating of Perceived Exertion (Exercise) 12    Symptoms None    Comments Pt's first day in the CRP2 program    Duration Continue with 30 min of aerobic exercise without signs/symptoms of physical distress.    Intensity THRR unchanged      Progression   Progression Continue to progress workloads to maintain  intensity without signs/symptoms of physical distress.    Average METs 2.1      Resistance Training   Training Prescription Yes    Weight 3 lbs    Reps 10-15    Time 10 Minutes      Interval Training   Interval Training No      NuStep   Level 2    SPM 75    Minutes 15    METs 2.1      Arm Ergometer   Level 2    RPM 60    Minutes 15             Social History   Tobacco Use  Smoking Status Never  Smokeless Tobacco Never    Goals Met:  Exercise tolerated well No report of concerns or symptoms today Strength training completed today  Goals Unmet:  Not Applicable  Comments: [name] started cardiac rehab today.  Pt tolerated light exercise without difficulty. VSS, telemetry-Sinus Rhythm, asymptomatic.  Medication list reconciled. Pt denies barriers to medicaiton compliance.  PSYCHOSOCIAL ASSESSMENT:  PHQ-5. Pt exhibits positive coping skills, hopeful outlook with supportive family. No psychosocial needs identified at this time, no psychosocial interventions necessary.    Pt enjoys reading, puzzles, movies, watching classic TV and canasta..   Pt oriented  to exercise equipment and routine.    Understanding verbalized. 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

## 2021-07-10 ENCOUNTER — Encounter (HOSPITAL_COMMUNITY)
Admission: RE | Admit: 2021-07-10 | Discharge: 2021-07-10 | Disposition: A | Discharge status: Home / Self Care | Payer: Medicare PPO | Source: Ambulatory Visit | Attending: Internal Medicine | Admitting: Internal Medicine

## 2021-07-10 ENCOUNTER — Ambulatory Visit (HOSPITAL_COMMUNITY): Payer: Medicare PPO

## 2021-07-10 ENCOUNTER — Other Ambulatory Visit: Payer: Self-pay

## 2021-07-10 DIAGNOSIS — Z955 Presence of coronary angioplasty implant and graft: Secondary | ICD-10-CM | POA: Diagnosis not present

## 2021-07-10 DIAGNOSIS — I2111 ST elevation (STEMI) myocardial infarction involving right coronary artery: Secondary | ICD-10-CM

## 2021-07-10 DIAGNOSIS — I213 ST elevation (STEMI) myocardial infarction of unspecified site: Secondary | ICD-10-CM | POA: Diagnosis not present

## 2021-07-11 ENCOUNTER — Telehealth: Payer: Self-pay | Admitting: Pharmacist

## 2021-07-11 NOTE — Chronic Care Management (AMB) (Signed)
Chronic Care Management Pharmacy Assistant   Name: [name]  MRN: [id number] DOB: 1951/07/22  Reason for Encounter: Medication Review / Medication Coordination Call   Conditions to be addressed/monitored: HTN, DMII, and GERD  Recent office visits:  None  Recent consult visits:  None  Hospital visits:  None in previous 6 months  Medications: Outpatient Encounter Medications as of 07/11/2021  Medication Sig   acetaminophen (TYLENOL) 500 MG tablet Take 1,000 mg by mouth every 6 (six) hours as needed for mild pain.   aspirin 81 MG chewable tablet Chew 1 tablet (81 mg total) by mouth daily.   BRILINTA 90 MG TABS tablet TAKE ONE TABLET BY MOUTH EVERY MORNING and TAKE ONE TABLET BY MOUTH EVERY EVENING   carvedilol (COREG) 3.125 MG tablet Take 1 tablet (3.125 mg total) by mouth 2 (two) times daily with a meal.   dapagliflozin propanediol (FARXIGA) 10 MG TABS tablet Take 1 tablet (10 mg total) by mouth daily before breakfast.   diclofenac Sodium (VOLTAREN) 1 % GEL Apply 2 g topically 2 (two) times daily as needed (pain).   finasteride ([name]) 5 MG tablet Take 1 tablet (5 mg total) by mouth daily.   HYDROcodone-acetaminophen (NORCO) 5-325 MG tablet Take 1 tablet by mouth every 4 (four) hours as needed for moderate pain. (Patient not taking: Reported on 06/26/2021)   ketoconazole (NIZORAL) 2 % cream Apply 1 application topically daily as needed for irritation.   LORazepam (ATIVAN) 2 MG tablet TAKE 1 TABLET 6 HOURS AS NEEDED FOR ANXIETY. (DOSE CHANGE, FILL 10/14/20 BASED ON PREV PICKUP)   nitroGLYCERIN (NITROSTAT) 0.4 MG SL tablet Place 1 tablet (0.4 mg total) under the tongue every 5 (five) minutes as needed for chest pain.   omeprazole (PRILOSEC) 20 MG capsule Take 1 capsule (20 mg total) by mouth as needed. (Patient taking differently: Take 20 mg by mouth daily as needed (acid reflux).)   rosuvastatin (CRESTOR) 10 MG tablet Take 1 tablet (10 mg total) by mouth daily.    sacubitril-valsartan (ENTRESTO) 24-26 MG Take 1 tablet by mouth 2 (two) times daily.   triamcinolone cream (KENALOG) 0.1 % Apply 1 application topically 2 (two) times daily as needed (psoriasis).   [DISCONTINUED] tadalafil (CIALIS) 20 MG tablet Take 20 mg by mouth daily as needed.     No facility-administered encounter medications on file as of 07/11/2021.  Reviewed chart for medication changes ahead of medication coordination call.  No OVs, Consults, or hospital visits since last care coordination call/Pharmacist visit.   BP Readings from Last 3 Encounters:  07/02/21 110/70  06/10/21 96/69  06/09/21 125/60    Lab Results  Component Value Date   HGBA1C 6.0 05/30/2021     Patient obtains medications through Adherence Packaging  30 Days   Last adherence delivery included:  Ticagrelor (BRILINTA) 90 mg: one tablet at breakfast and one tablet at dinner Finasteride ([name]) 5 mg: one tablet before breakfast Farxiga 10 mg: one tablet before breakfast Carvedilol (COREG) 3.125 mg: one tablet at breakfast and one tablet at dinner Rosuvastatin (CRESTOR) 10 mg: one tablet at breakfast  Patient declined Omeproazole last month due to PRN use/additional supply on hand.  Called patient and reviewed medications and patient no longer wants delivery service, he will pick up his medications going forward.  This pick up to include: Ticagrelor (BRILINTA) 90 mg: one tablet at breakfast and one tablet at dinner Farxiga 10 mg: one tablet before breakfast Rosuvastatin (CRESTOR) 10 mg: one tablet at breakfast Finasteride (  [name]) 5 mg: one tablet before breakfast Carvedilol (COREG) 3.125 mg: one tablet at breakfast and one tablet at dinner Omeprazole 20 mg : take one tablet by mouth as needed. Faith Rogue)  Patient declined the following medications Entresto due to plenty on hand, patient was taking it once daily, he recently started taking this BID.  Patient does want Omeprazole 20 mg one tablet as  needed in a vial with this order.  Confirmed pickup date of 07/24/2021.  Care Gaps: Zoster Vaccine- Overdue Flu Vaccine - Overdue AWV - completed 12/28/2020  Star Rating Drugs: Wilder Glade 10mg  last filled 06/19/2021 30DS at [facility] Rosuvastatin 10 mg last filled 06/19/2021 at [facility] Entresto 24mg -26mg  last filled 04/16/2021 at [facility] Pharmacist Assistant [phone]

## 2021-07-12 ENCOUNTER — Ambulatory Visit (HOSPITAL_COMMUNITY): Payer: Medicare PPO

## 2021-07-12 ENCOUNTER — Other Ambulatory Visit: Payer: Self-pay

## 2021-07-12 ENCOUNTER — Encounter (HOSPITAL_COMMUNITY)
Admission: RE | Admit: 2021-07-12 | Discharge: 2021-07-12 | Disposition: A | Discharge status: Home / Self Care | Payer: Medicare PPO | Source: Ambulatory Visit | Attending: Internal Medicine | Admitting: Internal Medicine

## 2021-07-12 DIAGNOSIS — I213 ST elevation (STEMI) myocardial infarction of unspecified site: Secondary | ICD-10-CM | POA: Diagnosis not present

## 2021-07-12 DIAGNOSIS — Z955 Presence of coronary angioplasty implant and graft: Secondary | ICD-10-CM | POA: Diagnosis not present

## 2021-07-12 DIAGNOSIS — I2111 ST elevation (STEMI) myocardial infarction involving right coronary artery: Secondary | ICD-10-CM

## 2021-07-15 ENCOUNTER — Other Ambulatory Visit: Payer: Self-pay

## 2021-07-15 ENCOUNTER — Telehealth: Payer: Self-pay | Admitting: Family Medicine

## 2021-07-15 ENCOUNTER — Ambulatory Visit (HOSPITAL_COMMUNITY): Payer: Medicare PPO

## 2021-07-15 ENCOUNTER — Encounter (HOSPITAL_COMMUNITY)
Admission: RE | Admit: 2021-07-15 | Discharge: 2021-07-15 | Disposition: A | Discharge status: Home / Self Care | Payer: Medicare PPO | Source: Ambulatory Visit | Attending: Internal Medicine | Admitting: Internal Medicine

## 2021-07-15 ENCOUNTER — Other Ambulatory Visit (HOSPITAL_COMMUNITY): Payer: Self-pay

## 2021-07-15 DIAGNOSIS — Z955 Presence of coronary angioplasty implant and graft: Secondary | ICD-10-CM

## 2021-07-15 DIAGNOSIS — I2111 ST elevation (STEMI) myocardial infarction involving right coronary artery: Secondary | ICD-10-CM

## 2021-07-15 DIAGNOSIS — I213 ST elevation (STEMI) myocardial infarction of unspecified site: Secondary | ICD-10-CM | POA: Diagnosis not present

## 2021-07-15 NOTE — Telephone Encounter (Signed)
Pt is calling and would like dr fry ok to get 3 booster. Pt has had 1st and 2nd dose of pfizer vaccine and 2 additional booster pt  is sch for next booster of pfizer on 08-08-2021 at [facility]

## 2021-07-15 NOTE — Progress Notes (Signed)
Intermittent PVC's noted today during exercise at cardiac rehab. Patient asymptomatic. VSS. "[name] has been doing very well with exercise and told me he went horse back riding this past Saturday. Will fax exercise flow sheets to Dr. Clayborne Dana office for review with today's ECG tracings.Barnet Pall, RN,BSN 07/15/2021 2:58 PM

## 2021-07-16 NOTE — Telephone Encounter (Signed)
Please advise 

## 2021-07-16 NOTE — Telephone Encounter (Signed)
Spoke with patient to inform about message. Voiced understanding.   Last vaccine 05/10/21.   Next vaccine booster scheduled on 08/08/21.

## 2021-07-16 NOTE — Telephone Encounter (Signed)
Yes, I do recommend he get the new bivalent Covid vaccine as long as it has been 2 months since the last one

## 2021-07-17 ENCOUNTER — Encounter (HOSPITAL_COMMUNITY): Payer: Medicare PPO

## 2021-07-17 ENCOUNTER — Ambulatory Visit (HOSPITAL_COMMUNITY): Payer: Medicare PPO

## 2021-07-19 ENCOUNTER — Other Ambulatory Visit: Payer: Self-pay

## 2021-07-19 ENCOUNTER — Ambulatory Visit (HOSPITAL_COMMUNITY): Payer: Medicare PPO

## 2021-07-19 ENCOUNTER — Encounter (HOSPITAL_COMMUNITY)
Admission: RE | Admit: 2021-07-19 | Discharge: 2021-07-19 | Disposition: A | Discharge status: Home / Self Care | Payer: Medicare PPO | Source: Ambulatory Visit | Attending: Internal Medicine | Admitting: Internal Medicine

## 2021-07-19 DIAGNOSIS — Z955 Presence of coronary angioplasty implant and graft: Secondary | ICD-10-CM | POA: Diagnosis not present

## 2021-07-19 DIAGNOSIS — I213 ST elevation (STEMI) myocardial infarction of unspecified site: Secondary | ICD-10-CM | POA: Diagnosis not present

## 2021-07-19 DIAGNOSIS — I2111 ST elevation (STEMI) myocardial infarction involving right coronary artery: Secondary | ICD-10-CM

## 2021-07-22 ENCOUNTER — Other Ambulatory Visit: Payer: Self-pay

## 2021-07-22 ENCOUNTER — Ambulatory Visit (HOSPITAL_COMMUNITY): Payer: Medicare PPO

## 2021-07-22 ENCOUNTER — Encounter (HOSPITAL_COMMUNITY)
Admission: RE | Admit: 2021-07-22 | Discharge: 2021-07-22 | Disposition: A | Discharge status: Home / Self Care | Payer: Medicare PPO | Source: Ambulatory Visit | Attending: Internal Medicine | Admitting: Internal Medicine

## 2021-07-22 DIAGNOSIS — I2111 ST elevation (STEMI) myocardial infarction involving right coronary artery: Secondary | ICD-10-CM

## 2021-07-22 DIAGNOSIS — I213 ST elevation (STEMI) myocardial infarction of unspecified site: Secondary | ICD-10-CM | POA: Diagnosis not present

## 2021-07-22 DIAGNOSIS — Z955 Presence of coronary angioplasty implant and graft: Secondary | ICD-10-CM

## 2021-07-23 NOTE — Progress Notes (Signed)
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, Disp: 180 tablet, Rfl: 1   dapagliflozin propanediol (FARXIGA) 10 MG TABS tablet, Take 1 tablet (10 mg total) by mouth daily before breakfast., Disp: 30 tablet, Rfl: 11   diclofenac Sodium (VOLTAREN) 1 % GEL, Apply 2 g topically 2 (two) times daily as needed (pain)., Disp: , Rfl:    finasteride ([name]) 5 MG tablet, Take 1 tablet (5 mg total) by mouth daily., Disp: 90 tablet, Rfl: 0   HYDROcodone-acetaminophen (NORCO) 5-325 MG tablet, Take 1 tablet by mouth every 4 (four) hours as needed for moderate pain. (Patient not taking: Reported on 06/26/2021), Disp: 6 tablet, Rfl: 0   ketoconazole (NIZORAL) 2 % cream, Apply 1 application topically daily as needed for irritation., Disp: , Rfl:    LORazepam (ATIVAN) 2 MG tablet, TAKE 1 TABLET 6 HOURS AS NEEDED FOR ANXIETY.  (DOSE CHANGE, FILL 10/14/20 BASED ON PREV PICKUP), Disp: 90 tablet, Rfl: 5   nitroGLYCERIN (NITROSTAT) 0.4 MG SL tablet, Place 1 tablet (0.4 mg total) under the tongue every 5 (five) minutes as needed for chest pain., Disp: 50 tablet, Rfl: 3   omeprazole (PRILOSEC) 20 MG capsule, Take 1 capsule (20 mg total) by mouth as needed. (Patient taking differently: Take 20 mg by mouth daily as needed (acid reflux).), Disp: 30 capsule, Rfl: 11   rosuvastatin (CRESTOR) 10 MG tablet, Take 1 tablet (10 mg total) by mouth daily., Disp: 90 tablet, Rfl: 3   sacubitril-valsartan (ENTRESTO) 24-26 MG, Take 1 tablet by mouth 2 (two) times daily., Disp: 60 tablet, Rfl: 11   triamcinolone cream (KENALOG) 0.1 %, Apply 1 application topically 2 (two) times daily as needed (psoriasis)., Disp: , Rfl:   Past Medical History: Past Medical History:  Diagnosis Date   Anxiety    Arthritis    Cancer ([place]) 2010   basal cell ca, head   Cataract    bilateral   Depression    ED (erectile dysfunction)    GERD (gastroesophageal reflux disease)    Hyperlipidemia    Hypertension    Kidney stone 07-29-11   passed    Neuromuscular disorder ([place])    neuropathy legs   Vitreous floater    left eye, sees Dr. Dawna Part at [place] Use: Social History   Tobacco Use  Smoking Status Never  Smokeless Tobacco Never    Labs: Recent Review Flowsheet Data     Labs for ITP Cardiac and Pulmonary Rehab Latest Ref Rng & Units 01/22/2021 01/23/2021 01/24/2021 01/25/2021 05/30/2021   Cholestrol 0 - 200 mg/dL - - - - 119   LDLCALC 0 - 99 mg/dL - - - - 56   LDLDIRECT mg/dL - - - - -   HDL >39.00 mg/dL - - - - 45.10   Trlycerides 0.0 - 149.0 mg/dL - - - - 90.0   Hemoglobin A1c 4.6 - 6.5 % - - - - 6.0   PHART 7.350 - 7.450 - - - - -   PCO2ART 32.0 - 48.0 mmHg - - - - -   HCO3 20.0 - 28.0 mmol/L - - - - -   TCO2 22 - 32 mmol/L - - - - -   ACIDBASEDEF 0.0 - 2.0 mmol/L - - - - -   O2SAT % 74.2 70.8 68.0 62.4 -        Capillary Blood Glucose: Lab Results  Component Value Date   GLUCAP 92 02/07/2021   GLUCAP 86 02/07/2021   GLUCAP 96 02/06/2021   GLUCAP 132 (H) 02/06/2021   GLUCAP 94 02/06/2021     Exercise Target Goals: Exercise Program Goal: Individual exercise prescription set using results from initial 6 min walk test and THRR while considering  patient's activity barriers and safety.   Exercise Prescription Goal: Starting with aerobic activity 30 plus minutes a day, 3 days per week for initial exercise prescription. Provide home exercise prescription and guidelines that participant acknowledges understanding prior to discharge.  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 HR 89 bpm     Max Ex. BP 136/68     2 Minute Post BP 120/70              Oxygen Initial Assessment:   Oxygen Re-Evaluation:   Oxygen Discharge (Final Oxygen Re-Evaluation):   Initial Exercise Prescription:  Initial Exercise Prescription - 07/02/21 1500       Date of Initial Exercise RX and Referring Provider   Date 07/02/21    Referring Provider Glori Bickers, MD    Expected Discharge Date 09/06/21      NuStep   Level 2    SPM 75    Minutes 15    METs 2  Arm  Ergometer   Level 1.5    RPM 60    Minutes 15    METs 2      Prescription Details   Frequency (times per week) 3    Duration Progress to 30 minutes of continuous aerobic without signs/symptoms of physical distress      Intensity   THRR 40-80% of Max Heartrate 60-120    Ratings of Perceived Exertion 11-13    Perceived Dyspnea 0-4      Progression   Progression Continue progressive overload as per policy without signs/symptoms or physical distress.      Resistance Training   Training Prescription Yes    Weight 3 lbs    Reps 10-15             Perform Capillary Blood Glucose checks as needed.  Exercise Prescription Changes:   Exercise Prescription Changes     Row Name 07/08/21 1400 07/22/21 1500           Response to Exercise   Blood Pressure (Admit) 130/80 104/56      Blood Pressure (Exercise) 124/72 154/62      Blood Pressure (Exit) 118/70 112/74      Heart Rate (Admit) 65 bpm 75 bpm      Heart Rate (Exercise) 75 bpm 88 bpm      Heart Rate (Exit) 57 bpm 65 bpm      Rating of Perceived Exertion (Exercise) 12 12      Symptoms None None      Comments Pt's first day in the CRP2 program Reviewed METs      Duration Continue with 30 min of aerobic exercise without signs/symptoms of physical distress. Continue with 30 min of aerobic exercise without signs/symptoms of physical distress.      Intensity THRR unchanged THRR unchanged        Progression   Progression Continue to progress workloads to maintain intensity without signs/symptoms of physical distress. Continue to progress workloads to maintain intensity without signs/symptoms of physical distress.      Average METs 2.1 2.8        Resistance Training   Training Prescription Yes Yes      Weight 3 lbs 3 lbs      Reps 10-15 10-15      Time 10 Minutes 10 Minutes        Interval Training   Interval Training No No        NuStep   Level 2 2      SPM 75 85      Minutes 15 15      METs 2.1 2.6        Arm  Ergometer   Level 2 --      RPM 60 --      Minutes 15 --        Track   Laps -- 18      Minutes -- 15      METs -- 3.09               Exercise Comments:   Exercise Comments     Row Name 07/08/21 1448 07/22/21 1500         Exercise Comments Pt's first day in the CRP2 program. Pt tolerated the session well with no complaints. Reviewed METs. Pt progressing well. Will progress on nustep next session. Pt voiced desire to discontinue arm ergometer so track was added to routine. Pt tolerated track well.  Exercise Goals and Review:   Exercise Goals     Row Name 07/02/21 1523             Exercise Goals   Increase Physical Activity Yes       Intervention Provide advice, education, support and counseling about physical activity/exercise needs.;Develop an individualized exercise prescription for aerobic and resistive training based on initial evaluation findings, risk stratification, comorbidities and participant's personal goals.       Expected Outcomes Short Term: Attend rehab on a regular basis to increase amount of physical activity.;Long Term: Add in home exercise to make exercise part of routine and to increase amount of physical activity.;Long Term: Exercising regularly at least 3-5 days a week.       Increase Strength and Stamina Yes       Intervention Provide advice, education, support and counseling about physical activity/exercise needs.;Develop an individualized exercise prescription for aerobic and resistive training based on initial evaluation findings, risk stratification, comorbidities and participant's personal goals.       Expected Outcomes Short Term: Increase workloads from initial exercise prescription for resistance, speed, and METs.;Short Term: Perform resistance training exercises routinely during rehab and add in resistance training at home;Long Term: Improve cardiorespiratory fitness, muscular endurance and strength as measured by increased  METs and functional capacity (6MWT)       Able to understand and use rate of perceived exertion (RPE) scale Yes       Intervention Provide education and explanation on how to use RPE scale       Expected Outcomes Long Term:  Able to use RPE to guide intensity level when exercising independently;Short Term: Able to use RPE daily in rehab to express subjective intensity level       Knowledge and understanding of Target Heart Rate Range (THRR) Yes       Intervention Provide education and explanation of THRR including how the numbers were predicted and where they are located for reference       Expected Outcomes Short Term: Able to state/look up THRR;Long Term: Able to use THRR to govern intensity when exercising independently;Short Term: Able to use daily as guideline for intensity in rehab       Understanding of Exercise Prescription Yes       Intervention Provide education, explanation, and written materials on patient's individual exercise prescription       Expected Outcomes Short Term: Able to explain program exercise prescription;Long Term: Able to explain home exercise prescription to exercise independently                Exercise Goals Re-Evaluation :  Exercise Goals Re-Evaluation     [place] Name 07/08/21 1446             Exercise Goal Re-Evaluation   Exercise Goals Review Increase Physical Activity;Increase Strength and Stamina;Able to understand and use rate of perceived exertion (RPE) scale;Knowledge and understanding of Target Heart Rate Range (THRR);Understanding of Exercise Prescription       Comments Pt's first day in the CRP2 program. Pt understnads the exercise Rx, THRR, and RPE scale.       Expected Outcomes Will continue to monitor patient and progress exercise workloads as tolerated.                 Discharge Exercise Prescription (Final Exercise Prescription Changes):  Exercise Prescription Changes - 07/22/21 1500       Response to Exercise   Blood Pressure  (Admit) 104/56    Blood  Pressure (Exercise) 154/62    Blood Pressure (Exit) 112/74    Heart Rate (Admit) 75 bpm    Heart Rate (Exercise) 88 bpm    Heart Rate (Exit) 65 bpm    Rating of Perceived Exertion (Exercise) 12    Symptoms None    Comments Reviewed METs    Duration Continue with 30 min of aerobic exercise without signs/symptoms of physical distress.    Intensity THRR unchanged      Progression   Progression Continue to progress workloads to maintain intensity without signs/symptoms of physical distress.    Average METs 2.8      Resistance Training   Training Prescription Yes    Weight 3 lbs    Reps 10-15    Time 10 Minutes      Interval Training   Interval Training No      NuStep   Level 2    SPM 85    Minutes 15    METs 2.6      Track   Laps 18    Minutes 15    METs 3.09             Nutrition:  Target Goals: Understanding of nutrition guidelines, daily intake of sodium 1500mg , cholesterol 200mg , calories 30% from fat and 7% or less from saturated fats, daily to have 5 or more servings of fruits and vegetables.  Biometrics:  Pre Biometrics - 07/02/21 1315       Pre Biometrics   Waist Circumference 38.75 inches    Hip Circumference 39 inches    Waist to Hip Ratio 0.99 %    Triceps Skinfold 11 mm    % Body Fat 22.5 %    Grip Strength 40 kg    Flexibility 0 in   unable to reach   Single Leg Stand 20.93 seconds              Nutrition Therapy Plan and Nutrition Goals:   Nutrition Assessments:  MEDIFICTS Score Key: ?70 Need to make dietary changes  40-70 Heart Healthy Diet ? 40 Therapeutic Level Cholesterol Diet   Picture Your Plate Scores: <35 Unhealthy dietary pattern with much room for improvement. 41-50 Dietary pattern unlikely to meet recommendations for good health and room for improvement. 51-60 More healthful dietary pattern, with some room for improvement.  >60 Healthy dietary pattern, although there may be some specific  behaviors that could be improved.    Nutrition Goals Re-Evaluation:   Nutrition Goals Discharge (Final Nutrition Goals Re-Evaluation):   Psychosocial: Target Goals: Acknowledge presence or absence of significant depression and/or stress, maximize coping skills, provide positive support system. Participant is able to verbalize types and ability to use techniques and skills needed for reducing stress and depression.  Initial Review & Psychosocial Screening:  Initial Psych Review & Screening - 07/02/21 1613       Initial Review   Current issues with History of Depression;Current Anxiety/Panic;Current Sleep Concerns      Family Dynamics   Comments [name] is able to deal with his anxiety with the use of the medication - Ativan,  Toby, [name]'s dog, provides emotional support.  Uses the technique of pacing and wringing his hands.      Barriers   Psychosocial barriers to participate in program The patient should benefit from training in stress management and relaxation.;Psychosocial barriers identified (see note)      Screening Interventions   Interventions Encouraged to exercise;Provide feedback about the scores to participant    Expected  Outcomes Short Term goal: Identification and review with participant of any Quality of Life or Depression concerns found by scoring the questionnaire.;Long Term goal: The participant improves quality of Life and PHQ9 Scores as seen by post scores and/or verbalization of changes             Quality of Life Scores:  Quality of Life - 07/02/21 1517       Quality of Life   Select Quality of Life      Quality of Life Scores   Health/Function Pre 26.13 %    Socioeconomic Pre 24.79 %    Psych/Spiritual Pre 23.79 %    Family Pre 21.33 %    GLOBAL Pre 24.88 %            Scores of 19 and below usually indicate a poorer quality of life in these areas.  A difference of  2-3 points is a clinically meaningful difference.  A difference of 2-3 points  in the total score of the Quality of Life Index has been associated with significant improvement in overall quality of life, self-image, physical symptoms, and general health in studies assessing change in quality of life.  PHQ-9: Recent Review Flowsheet Data     Depression screen [facility] 2/9 07/02/2021 05/30/2021 02/26/2021 12/28/2020 12/28/2020   Decreased Interest 0 0 0 0 0   Down, Depressed, Hopeless 0 0 0 0 0   PHQ - 2 Score 0 0 0 0 0   Altered sleeping 2 1 - - -   Tired, decreased energy 1 0 - - -   Change in appetite 1 0 - - -   Feeling bad or failure about yourself  0 0 - - -   Trouble concentrating 0 0 - - -   Moving slowly or fidgety/restless 1 0 - - -   Suicidal thoughts 0 0 - - -   PHQ-9 Score 5 1 - - -   Difficult doing work/chores Somewhat difficult Somewhat difficult - - -      Interpretation of Total Score  Total Score Depression Severity:  1-4 = Minimal depression, 5-9 = Mild depression, 10-14 = Moderate depression, 15-19 = Moderately severe depression, 20-27 = Severe depression   Psychosocial Evaluation and Intervention:   Psychosocial Re-Evaluation:  Psychosocial Re-Evaluation     Row Name 07/23/21 1514             Psychosocial Re-Evaluation   Current issues with Current Sleep Concerns;History of Depression;Current Stress Concerns       Comments [name] reports that continues to have problems with insomnia at times. [name] has not voiced any increased stressors since participating in phase 2 cardiac rehab.       Expected Outcomes [name] will have decreased stressors upon completion of phase 2 cardiac rehab.       Interventions Stress management education;Encouraged to attend Cardiac Rehabilitation for the exercise;Relaxation education       Continue Psychosocial Services  No Follow up required         Initial Review   Source of Stress Concerns Chronic Illness;Unable to participate in former interests or hobbies;Unable to perform yard/household activities        Comments Will continue to monitor and offer support as needed.                Psychosocial Discharge (Final Psychosocial Re-Evaluation):  Psychosocial Re-Evaluation - 07/23/21 1514       Psychosocial Re-Evaluation   Current issues with Current Sleep Concerns;History  of Depression;Current Stress Concerns    Comments [name] reports that continues to have problems with insomnia at times. [name] has not voiced any increased stressors since participating in phase 2 cardiac rehab.    Expected Outcomes [name] will have decreased stressors upon completion of phase 2 cardiac rehab.    Interventions Stress management education;Encouraged to attend Cardiac Rehabilitation for the exercise;Relaxation education    Continue Psychosocial Services  No Follow up required      Initial Review   Source of Stress Concerns Chronic Illness;Unable to participate in former interests or hobbies;Unable to perform yard/household activities    Comments Will continue to monitor and offer support as needed.             Vocational Rehabilitation: Provide vocational rehab assistance to qualifying candidates.   Vocational Rehab Evaluation & Intervention:  Vocational Rehab - 07/02/21 1616       Initial Vocational Rehab Evaluation & Intervention   Assessment shows need for Vocational Rehabilitation No             Education: Education Goals: Education classes will be provided on a weekly basis, covering required topics. Participant will state understanding/return demonstration of topics presented.  Learning Barriers/Preferences:  Learning Barriers/Preferences - 07/02/21 1518       Learning Barriers/Preferences   Learning Barriers Sight   wears glasses   Learning Preferences Audio;Computer/Internet;Group Instruction;Individual Instruction;Pictoral;Skilled Demonstration;Verbal Instruction;Video;Written Material             Education Topics: Hypertension, Hypertension Reduction -Define heart  disease and high blood pressure. Discus how high blood pressure affects the body and ways to reduce high blood pressure.   Exercise and Your Heart -Discuss why it is important to exercise, the FITT principles of exercise, normal and abnormal responses to exercise, and how to exercise safely.   Angina -Discuss definition of angina, causes of angina, treatment of angina, and how to decrease risk of having angina.   Cardiac Medications -Review what the following cardiac medications are used for, how they affect the body, and side effects that may occur when taking the medications.  Medications include Aspirin, Beta blockers, calcium channel blockers, ACE Inhibitors, angiotensin receptor blockers, diuretics, digoxin, and antihyperlipidemics.   Congestive Heart Failure -Discuss the definition of CHF, how to live with CHF, the signs and symptoms of CHF, and how keep track of weight and sodium intake.   Heart Disease and Intimacy -Discus the effect sexual activity has on the heart, how changes occur during intimacy as we age, and safety during sexual activity.   Smoking Cessation / COPD -Discuss different methods to quit smoking, the health benefits of quitting smoking, and the definition of COPD.   Nutrition I: Fats -Discuss the types of cholesterol, what cholesterol does to the heart, and how cholesterol levels can be controlled.   Nutrition II: Labels -Discuss the different components of food labels and how to read food label   Heart Parts/Heart Disease and PAD -Discuss the anatomy of the heart, the pathway of blood circulation through the heart, and these are affected by heart disease.   Stress I: Signs and Symptoms -Discuss the causes of stress, how stress may lead to anxiety and depression, and ways to limit stress.   Stress II: Relaxation -Discuss different types of relaxation techniques to limit stress.   Warning Signs of Stroke / TIA -Discuss definition of a stroke,  what the signs and symptoms are of a stroke, and how to identify when someone is having stroke.  Knowledge Questionnaire Score:  Knowledge Questionnaire Score - 07/02/21 1518       Knowledge Questionnaire Score   Pre Score 20/24             Core Components/Risk Factors/Patient Goals at Admission:  Personal Goals and Risk Factors at Admission - 07/02/21 1519       Core Components/Risk Factors/Patient Goals on Admission    Weight Management Weight Maintenance    Heart Failure Yes    Intervention Provide a combined exercise and nutrition program that is supplemented with education, support and counseling about heart failure. Directed toward relieving symptoms such as shortness of breath, decreased exercise tolerance, and extremity edema.    Expected Outcomes Long term: Adoption of self-care skills and reduction of barriers for early signs and symptoms recognition and intervention leading to self-care maintenance.;Short term: Daily weights obtained and reported for increase. Utilizing diuretic protocols set by physician.;Short term: Attendance in program 2-3 days a week with increased exercise capacity. Reported lower sodium intake. Reported increased fruit and vegetable intake. Reports medication compliance.;Improve functional capacity of life    Hypertension Yes    Intervention Provide education on lifestyle modifcations including regular physical activity/exercise, weight management, moderate sodium restriction and increased consumption of fresh fruit, vegetables, and low fat dairy, alcohol moderation, and smoking cessation.;Monitor prescription use compliance.    Expected Outcomes Short Term: Continued assessment and intervention until BP is < 140/28mm HG in hypertensive participants. < 130/16mm HG in hypertensive participants with diabetes, heart failure or chronic kidney disease.;Long Term: Maintenance of blood pressure at goal levels.    Lipids Yes    Intervention Provide education  and support for participant on nutrition & aerobic/resistive exercise along with prescribed medications to achieve LDL 70mg , HDL >40mg .    Expected Outcomes Short Term: Participant states understanding of desired cholesterol values and is compliant with medications prescribed. Participant is following exercise prescription and nutrition guidelines.;Long Term: Cholesterol controlled with medications as prescribed, with individualized exercise RX and with personalized nutrition plan. Value goals: LDL < 70mg , HDL > 40 mg.             Core Components/Risk Factors/Patient Goals Review:   Goals and Risk Factor Review     Row Name 07/23/21 1517             Core Components/Risk Factors/Patient Goals Review   Personal Goals Review Weight Management/Obesity;Heart Failure;Stress;Hypertension;Lipids       Review [name] has been doing well with exercise at cardiac rehab. Vital signs and weight have been stable.       Expected Outcomes [name] will continue to participate in phase 2 cardiac rehab for exercise, nutrition and lifestyle modifications                Core Components/Risk Factors/Patient Goals at Discharge (Final Review):   Goals and Risk Factor Review - 07/23/21 1517       Core Components/Risk Factors/Patient Goals Review   Personal Goals Review Weight Management/Obesity;Heart Failure;Stress;Hypertension;Lipids    Review [name] has been doing well with exercise at cardiac rehab. Vital signs and weight have been stable.    Expected Outcomes [name] will continue to participate in phase 2 cardiac rehab for exercise, nutrition and lifestyle modifications             ITP Comments:  ITP Comments     Row Name 07/23/21 1512           ITP Comments 30 Day ITP Review. [name] has good attendance and participaiton. [name]  is off to a good start to exercise.                Comments: See ITP comments.Barnet Pall, RN,BSN 07/23/2021 3:20 PM

## 2021-07-24 ENCOUNTER — Other Ambulatory Visit: Payer: Self-pay

## 2021-07-24 ENCOUNTER — Encounter (HOSPITAL_COMMUNITY)
Admission: RE | Admit: 2021-07-24 | Discharge: 2021-07-24 | Disposition: A | Discharge status: Home / Self Care | Payer: Medicare PPO | Source: Ambulatory Visit | Attending: Internal Medicine | Admitting: Internal Medicine

## 2021-07-24 ENCOUNTER — Ambulatory Visit (HOSPITAL_COMMUNITY): Payer: Medicare PPO

## 2021-07-24 DIAGNOSIS — I2111 ST elevation (STEMI) myocardial infarction involving right coronary artery: Secondary | ICD-10-CM | POA: Insufficient documentation

## 2021-07-24 DIAGNOSIS — Z955 Presence of coronary angioplasty implant and graft: Secondary | ICD-10-CM | POA: Insufficient documentation

## 2021-07-26 ENCOUNTER — Other Ambulatory Visit: Payer: Self-pay

## 2021-07-26 ENCOUNTER — Encounter (HOSPITAL_COMMUNITY)
Admission: RE | Admit: 2021-07-26 | Discharge: 2021-07-26 | Disposition: A | Discharge status: Home / Self Care | Payer: Medicare PPO | Source: Ambulatory Visit | Attending: Internal Medicine | Admitting: Internal Medicine

## 2021-07-26 ENCOUNTER — Ambulatory Visit (HOSPITAL_COMMUNITY): Payer: Medicare PPO

## 2021-07-26 DIAGNOSIS — I2111 ST elevation (STEMI) myocardial infarction involving right coronary artery: Secondary | ICD-10-CM | POA: Diagnosis not present

## 2021-07-26 DIAGNOSIS — Z955 Presence of coronary angioplasty implant and graft: Secondary | ICD-10-CM

## 2021-07-29 ENCOUNTER — Other Ambulatory Visit: Payer: Self-pay

## 2021-07-29 ENCOUNTER — Encounter (HOSPITAL_COMMUNITY)
Admission: RE | Admit: 2021-07-29 | Discharge: 2021-07-29 | Disposition: A | Discharge status: Home / Self Care | Payer: Medicare PPO | Source: Ambulatory Visit | Attending: Internal Medicine | Admitting: Internal Medicine

## 2021-07-29 ENCOUNTER — Ambulatory Visit (HOSPITAL_COMMUNITY): Payer: Medicare PPO

## 2021-07-29 DIAGNOSIS — Z955 Presence of coronary angioplasty implant and graft: Secondary | ICD-10-CM | POA: Diagnosis not present

## 2021-07-29 DIAGNOSIS — I2111 ST elevation (STEMI) myocardial infarction involving right coronary artery: Secondary | ICD-10-CM | POA: Diagnosis not present

## 2021-07-30 DIAGNOSIS — N5201 Erectile dysfunction due to arterial insufficiency: Secondary | ICD-10-CM | POA: Diagnosis not present

## 2021-07-30 DIAGNOSIS — N453 Epididymo-orchitis: Secondary | ICD-10-CM | POA: Diagnosis not present

## 2021-07-31 ENCOUNTER — Encounter (HOSPITAL_COMMUNITY)
Admission: RE | Admit: 2021-07-31 | Discharge: 2021-07-31 | Disposition: A | Discharge status: Home / Self Care | Payer: Medicare PPO | Source: Ambulatory Visit | Attending: Internal Medicine | Admitting: Internal Medicine

## 2021-07-31 ENCOUNTER — Other Ambulatory Visit: Payer: Self-pay

## 2021-07-31 ENCOUNTER — Ambulatory Visit (HOSPITAL_COMMUNITY): Payer: Medicare PPO

## 2021-07-31 DIAGNOSIS — Z955 Presence of coronary angioplasty implant and graft: Secondary | ICD-10-CM

## 2021-07-31 DIAGNOSIS — I2111 ST elevation (STEMI) myocardial infarction involving right coronary artery: Secondary | ICD-10-CM

## 2021-08-02 ENCOUNTER — Ambulatory Visit (HOSPITAL_COMMUNITY): Payer: Medicare PPO

## 2021-08-02 ENCOUNTER — Encounter (HOSPITAL_COMMUNITY)
Admission: RE | Admit: 2021-08-02 | Discharge: 2021-08-02 | Disposition: A | Discharge status: Home / Self Care | Payer: Medicare PPO | Source: Ambulatory Visit | Attending: Internal Medicine | Admitting: Internal Medicine

## 2021-08-02 ENCOUNTER — Other Ambulatory Visit: Payer: Self-pay

## 2021-08-02 DIAGNOSIS — I2111 ST elevation (STEMI) myocardial infarction involving right coronary artery: Secondary | ICD-10-CM

## 2021-08-02 DIAGNOSIS — Z955 Presence of coronary angioplasty implant and graft: Secondary | ICD-10-CM

## 2021-08-05 ENCOUNTER — Encounter (HOSPITAL_COMMUNITY): Payer: Medicare PPO

## 2021-08-05 ENCOUNTER — Ambulatory Visit (HOSPITAL_COMMUNITY): Payer: Medicare PPO

## 2021-08-05 ENCOUNTER — Encounter (HOSPITAL_COMMUNITY)
Admission: RE | Admit: 2021-08-05 | Discharge: 2021-08-05 | Disposition: A | Discharge status: Home / Self Care | Payer: Medicare PPO | Source: Ambulatory Visit | Attending: Internal Medicine | Admitting: Internal Medicine

## 2021-08-05 ENCOUNTER — Other Ambulatory Visit: Payer: Self-pay

## 2021-08-05 DIAGNOSIS — I2111 ST elevation (STEMI) myocardial infarction involving right coronary artery: Secondary | ICD-10-CM | POA: Diagnosis not present

## 2021-08-05 DIAGNOSIS — Z955 Presence of coronary angioplasty implant and graft: Secondary | ICD-10-CM

## 2021-08-07 ENCOUNTER — Other Ambulatory Visit: Payer: Self-pay

## 2021-08-07 ENCOUNTER — Encounter (HOSPITAL_COMMUNITY): Payer: Medicare PPO

## 2021-08-07 ENCOUNTER — Ambulatory Visit (HOSPITAL_COMMUNITY): Payer: Medicare PPO

## 2021-08-07 ENCOUNTER — Encounter (HOSPITAL_COMMUNITY)
Admission: RE | Admit: 2021-08-07 | Discharge: 2021-08-07 | Disposition: A | Discharge status: Home / Self Care | Payer: Medicare PPO | Source: Ambulatory Visit | Attending: Internal Medicine | Admitting: Internal Medicine

## 2021-08-07 DIAGNOSIS — I2111 ST elevation (STEMI) myocardial infarction involving right coronary artery: Secondary | ICD-10-CM

## 2021-08-07 DIAGNOSIS — Z955 Presence of coronary angioplasty implant and graft: Secondary | ICD-10-CM | POA: Diagnosis not present

## 2021-08-08 ENCOUNTER — Ambulatory Visit: Payer: Medicare PPO | Attending: Internal Medicine

## 2021-08-08 ENCOUNTER — Other Ambulatory Visit (HOSPITAL_BASED_OUTPATIENT_CLINIC_OR_DEPARTMENT_OTHER): Payer: Self-pay

## 2021-08-08 DIAGNOSIS — Z23 Encounter for immunization: Secondary | ICD-10-CM

## 2021-08-08 MED ORDER — PFIZER COVID-19 VAC BIVALENT 30 MCG/0.3ML IM SUSP
INTRAMUSCULAR | 0 refills | Status: DC
  Filled 2021-08-08: qty 0.3, 1d supply, fill #0

## 2021-08-08 NOTE — Progress Notes (Signed)
   Covid-19 Vaccination Clinic  Name:  [name]    MRN: [id number] DOB: 08-28-1951  08/08/2021  [name] was observed post Covid-19 immunization for 15 minutes without incident. He was provided with Vaccine Information Sheet and instruction to access the V-Safe system.   [name] was instructed to call 911 with any severe reactions post vaccine: Difficulty breathing  Swelling of face and throat  A fast heartbeat  A bad rash all over body  Dizziness and weakness   Immunizations Administered     Name Date Dose VIS Date Route   Pfizer Covid-19 Vaccine Bivalent Booster 08/08/2021 11:37 AM 0.3 mL 05/22/2021 Intramuscular   Manufacturer: [place]   Lot: [id number]   [place]: [phone]

## 2021-08-09 ENCOUNTER — Encounter (HOSPITAL_COMMUNITY)
Admission: RE | Admit: 2021-08-09 | Discharge: 2021-08-09 | Disposition: A | Discharge status: Home / Self Care | Payer: Medicare PPO | Source: Ambulatory Visit | Attending: Internal Medicine | Admitting: Internal Medicine

## 2021-08-09 ENCOUNTER — Encounter (HOSPITAL_COMMUNITY): Payer: Medicare PPO

## 2021-08-09 ENCOUNTER — Other Ambulatory Visit: Payer: Self-pay

## 2021-08-09 ENCOUNTER — Ambulatory Visit (HOSPITAL_COMMUNITY): Payer: Medicare PPO

## 2021-08-09 DIAGNOSIS — I2111 ST elevation (STEMI) myocardial infarction involving right coronary artery: Secondary | ICD-10-CM | POA: Diagnosis not present

## 2021-08-09 DIAGNOSIS — Z955 Presence of coronary angioplasty implant and graft: Secondary | ICD-10-CM | POA: Diagnosis not present

## 2021-08-09 NOTE — Progress Notes (Signed)
Advanced Heart Failure Clinic Note   PCP: Dr. [name] Penna HF Cardiologist: Dr. Haroldine Laws  HPI:  [name] is a 70 year old male with past medical history of hypertension, hyperlipidemia, VF arrest, CAD, and systolic heart failure.  He was admitted 4/22 with STEMI and cardiac arrest prior to coming to hospital.  He underwent emergent cardiac catheterization and PCI with stent to proximal RCA. EF 25 to 30%.  Patient did have prolonged ICU stay for cardiogenic/hemorrhagic shock requiring vasopressors, Impella and transfusion.  He suffered from diminished mental status and MRI showed scattered punctate infarction concerning for anoxic injury.  He had AKI requiring CRRT, then transitioned to [facility].  Palliative care was consulted and made DNR.  He was discharged to home on oxygen with Home Health, discharge weight 175.5 lbs.  Arlyce Harman, Imdur and hydralazine stopped 9/22 with issues with orthostasis and hyperkalemia. Had acute epididmyitis/orchitis with WBC 21k, followed up with Urology and resolved.  Echo 9/22 EF 55-60% RV mildly HK.  Today he returns for HF follow up. Doing CR 3x/week and this is going well. He does not have significant SOB with activity. Overall feeling fine. Denies CP, dizziness, edema, or PND/Orthopnea. Appetite ok. No fever or chills. Weight at home 176 pounds. Taking all medications.   Cardiac Studies: - Echo (9/22): EF 55-60%, RV mildly HK - Echo (4/22): EF 25-30%, severe LV dysfunction, RV moderately reduced  - LHC (4/22): Ost LAD to Prox LAD lesion is 50% stenosed. Mid LAD lesion is 20% stenosed. Prox RCA-1 lesion is 100% stenosed, s/p PCI+DES Prox RCA-2 lesion is 50% stenosed.  - RHC (4/22): On NE and Impella RA = 4 RV = 19/3 PA = 20/10 (11) PCW = 7 Fick cardiac output/index = 2.6/1.7 FA sat = 100% PA sat = 54%  ROS: All systems negative except as listed in HPI, PMH and Problem List.  SH:  Social History   Socioeconomic History   Marital status:  Single    Spouse name: Not on file   Number of children: Not on file   Years of education: Not on file   Highest education level: Not on file  Occupational History   Occupation: retired    Comment: Pharmacist, hospital  Tobacco Use   Smoking status: Never   Smokeless tobacco: Never  Vaping Use   Vaping Use: Never used  Substance and Sexual Activity   Alcohol use: No    Alcohol/week: 0.0 standard drinks   Drug use: No   Sexual activity: Not on file  Other Topics Concern   Not on file  Social History Narrative   Not on file   Social Determinants of Health   Financial Resource Strain: Low Risk    Difficulty of Paying Living Expenses: Not hard at all  Food Insecurity: No Food Insecurity   Worried About Charity fundraiser in the Last Year: Never true   [place] in the Last Year: Never true  Transportation Needs: No Transportation Needs   Lack of Transportation (Medical): No   Lack of Transportation (Non-Medical): No  Physical Activity: Sufficiently Active   Days of Exercise per Week: 7 days   Minutes of Exercise per Session: 30 min  Stress: No Stress Concern Present   Feeling of Stress : Not at all  Social Connections: Socially Isolated   Frequency of Communication with Friends and Family: More than three times a week   Frequency of Social Gatherings with Friends and Family: More than three times a  week   Attends Religious Services: Never   Active Member of Clubs or Organizations: No   Attends Music therapist: Never   Marital Status: Never married  Human resources officer Violence: Not At Risk   Fear of Current or Ex-Partner: No   Emotionally Abused: No   Physically Abused: No   Sexually Abused: No   FH:  Family History  Problem Relation Age of Onset   Hypertension Mother    Hyperlipidemia Mother    Depression Other    Diabetes Other    Hyperlipidemia Other    Hypertension Other    Alzheimer's disease Other    Colon cancer Neg Hx    Past Medical History:   Diagnosis Date   Anxiety    Arthritis    Cancer ([place]) 2010   basal cell ca, head   Cataract    bilateral   Depression    ED (erectile dysfunction)    GERD (gastroesophageal reflux disease)    Hyperlipidemia    Hypertension    Kidney stone 07-29-11   passed    Neuromuscular disorder ([place])    neuropathy legs   Vitreous floater    left eye, sees Dr. Dawna Part at [facility]    Current Outpatient Medications  Medication Sig Dispense Refill   acetaminophen (TYLENOL) 500 MG tablet Take 1,000 mg by mouth every 6 (six) hours as needed for mild pain.     aspirin 81 MG chewable tablet Chew 1 tablet (81 mg total) by mouth daily. 90 tablet 0   BRILINTA 90 MG TABS tablet TAKE ONE TABLET BY MOUTH EVERY MORNING and TAKE ONE TABLET BY MOUTH EVERY EVENING 120 tablet 1   carvedilol (COREG) 3.125 MG tablet Take 1 tablet (3.125 mg total) by mouth 2 (two) times daily with a meal. 180 tablet 1   dapagliflozin propanediol (FARXIGA) 10 MG TABS tablet Take 1 tablet (10 mg total) by mouth daily before breakfast. 30 tablet 11   diclofenac Sodium (VOLTAREN) 1 % GEL Apply 2 g topically 2 (two) times daily as needed (pain).     finasteride ([name]) 5 MG tablet Take 1 tablet (5 mg total) by mouth daily. 90 tablet 0   ketoconazole (NIZORAL) 2 % cream Apply 1 application topically daily as needed for irritation.     LORazepam (ATIVAN) 2 MG tablet TAKE 1 TABLET 6 HOURS AS NEEDED FOR ANXIETY. (DOSE CHANGE, FILL 10/14/20 BASED ON PREV PICKUP) 90 tablet 5   nitroGLYCERIN (NITROSTAT) 0.4 MG SL tablet Place 1 tablet (0.4 mg total) under the tongue every 5 (five) minutes as needed for chest pain. 50 tablet 3   omeprazole (PRILOSEC) 20 MG capsule Take 1 capsule (20 mg total) by mouth as needed. 30 capsule 11   rosuvastatin (CRESTOR) 10 MG tablet Take 1 tablet (10 mg total) by mouth daily. 90 tablet 3   sacubitril-valsartan (ENTRESTO) 24-26 MG Take 1 tablet by mouth 2 (two) times daily. 60 tablet 11   triamcinolone  cream (KENALOG) 0.1 % Apply 1 application topically 2 (two) times daily as needed (psoriasis).     No current facility-administered medications for this encounter.   BP 118/70   Pulse (!) 55   Wt 82.8 kg   SpO2 97%   BMI 24.77 kg/m   Wt Readings from Last 3 Encounters:  08/13/21 82.8 kg  07/02/21 80.1 kg  06/10/21 82.4 kg   PHYSICAL EXAM: General:  NAD. No resp difficulty HEENT: Normal Neck: Supple. No JVD. Carotids 2+ bilat; no  bruits. No lymphadenopathy or thryomegaly appreciated. Cor: PMI nondisplaced. Regular rate & rhythm. No rubs, gallops or murmurs. Lungs: Clear Abdomen: Soft, nontender, nondistended. No hepatosplenomegaly. No bruits or masses. Good bowel sounds. Extremities: No cyanosis, clubbing, rash, edema Neuro: Alert & oriented x 3, cranial nerves grossly intact. Moves all 4 extremities w/o difficulty. Affect pleasant.  ASSESSMENT & PLAN:  1. CAD - VF arrest in setting of acute MI. Emergent cath (4/22) w/ pRCA occlusion s/p PCI + DES. - No s/s angina. - Continue ASA 81 mg daily. - Continue Brilinta 90 mg bid. - Continue Crestor 10 mg daily. - Continue CR.  2. Chronic Systolic Heart Failure  - ICM. Echo (4/22) LVEF 20-25%, RV moderately reduced. - Echo (9/22): EF 55-60% RV mildly reduced.  - NYHA II. Volume status looks good today. - Continue Entresto 24/26 mg bid. - Continue Farxiga 10 mg daily. No current GU symptoms. - Continue carvedilol 3.125 mg bid. - Off spiro, imdur, hydralazine w/ orthostasis + hyperkalemia. - Labs today.  3. CKD IIIb-IV - Baseline SCr ~1.8-2.2. - Has been referred to [place], has appt 09/04/21. - BMET today.   4. Anoxic brain injury - Improved. - He has completed HH PT/OT/SLP. - PCP has cleared him to drive.  Follow up in 4-5 months with Dr. Haroldine Laws.  Rafael Bihari, FNP  2:33 PM

## 2021-08-12 ENCOUNTER — Other Ambulatory Visit: Payer: Self-pay

## 2021-08-12 ENCOUNTER — Telehealth: Payer: Self-pay | Admitting: Pharmacist

## 2021-08-12 ENCOUNTER — Encounter (HOSPITAL_COMMUNITY): Payer: Medicare PPO

## 2021-08-12 ENCOUNTER — Encounter (HOSPITAL_COMMUNITY)
Admission: RE | Admit: 2021-08-12 | Discharge: 2021-08-12 | Disposition: A | Discharge status: Home / Self Care | Payer: Medicare PPO | Source: Ambulatory Visit | Attending: Internal Medicine | Admitting: Internal Medicine

## 2021-08-12 DIAGNOSIS — I2111 ST elevation (STEMI) myocardial infarction involving right coronary artery: Secondary | ICD-10-CM

## 2021-08-12 DIAGNOSIS — Z955 Presence of coronary angioplasty implant and graft: Secondary | ICD-10-CM

## 2021-08-12 NOTE — Chronic Care Management (AMB) (Signed)
Chronic Care Management Pharmacy Assistant   Name: [name]  MRN: [id number] DOB: Feb 21, 1951  Reason for Encounter: Medication Review / Medication Coordination Call   Conditions to be addressed/monitored: HTN  Recent office visits:  None  Recent consult visits:  None  Hospital visits:  None  Medications: Outpatient Encounter Medications as of 08/12/2021  Medication Sig   acetaminophen (TYLENOL) 500 MG tablet Take 1,000 mg by mouth every 6 (six) hours as needed for mild pain.   aspirin 81 MG chewable tablet Chew 1 tablet (81 mg total) by mouth daily.   BRILINTA 90 MG TABS tablet TAKE ONE TABLET BY MOUTH EVERY MORNING and TAKE ONE TABLET BY MOUTH EVERY EVENING   carvedilol (COREG) 3.125 MG tablet Take 1 tablet (3.125 mg total) by mouth 2 (two) times daily with a meal.   COVID-19 mRNA bivalent vaccine, Pfizer, (PFIZER COVID-19 VAC BIVALENT) injection Inject into the muscle.   dapagliflozin propanediol (FARXIGA) 10 MG TABS tablet Take 1 tablet (10 mg total) by mouth daily before breakfast.   diclofenac Sodium (VOLTAREN) 1 % GEL Apply 2 g topically 2 (two) times daily as needed (pain).   finasteride ([name]) 5 MG tablet Take 1 tablet (5 mg total) by mouth daily.   HYDROcodone-acetaminophen (NORCO) 5-325 MG tablet Take 1 tablet by mouth every 4 (four) hours as needed for moderate pain. (Patient not taking: Reported on 06/26/2021)   ketoconazole (NIZORAL) 2 % cream Apply 1 application topically daily as needed for irritation.   LORazepam (ATIVAN) 2 MG tablet TAKE 1 TABLET 6 HOURS AS NEEDED FOR ANXIETY. (DOSE CHANGE, FILL 10/14/20 BASED ON PREV PICKUP)   nitroGLYCERIN (NITROSTAT) 0.4 MG SL tablet Place 1 tablet (0.4 mg total) under the tongue every 5 (five) minutes as needed for chest pain.   omeprazole (PRILOSEC) 20 MG capsule Take 1 capsule (20 mg total) by mouth as needed. (Patient taking differently: Take 20 mg by mouth daily as needed (acid reflux).)   rosuvastatin  (CRESTOR) 10 MG tablet Take 1 tablet (10 mg total) by mouth daily.   sacubitril-valsartan (ENTRESTO) 24-26 MG Take 1 tablet by mouth 2 (two) times daily.   triamcinolone cream (KENALOG) 0.1 % Apply 1 application topically 2 (two) times daily as needed (psoriasis).   [DISCONTINUED] tadalafil (CIALIS) 20 MG tablet Take 20 mg by mouth daily as needed.     No facility-administered encounter medications on file as of 08/12/2021.   Reviewed chart for medication changes ahead of medication coordination call.  No OVs, Consults, or hospital visits since last care coordination call/Pharmacist visit. (If appropriate, list visit date, provider name)  No medication changes indicated OR if recent visit, treatment plan here.  BP Readings from Last 3 Encounters:  07/02/21 110/70  06/10/21 96/69  06/09/21 125/60    Lab Results  Component Value Date   HGBA1C 6.0 05/30/2021     Patient obtains medications through Adherence Packaging  30 Days   Last adherence pickup included:  Ticagrelor (BRILINTA) 90 mg: one tablet at breakfast and one tablet at dinner Farxiga 10 mg: one tablet before breakfast Rosuvastatin (CRESTOR) 10 mg: one tablet at breakfast Finasteride ([name]) 5 mg: one tablet before breakfast Carvedilol (COREG) 3.125 mg: one tablet at breakfast and one tablet at dinner Omeprazole 20 mg : take one tablet by mouth as needed. Faith Rogue)  Patient declined (meds) last month:   Entresto due to plenty on hand, patient was taking it once daily, he recently started taking this BID  Patient is  due for next adherence pick up on: 08/23/2021.  Called patient and reviewed medications and coordinated pickup.  This pick up to include: Ticagrelor (BRILINTA) 90 mg: one tablet at breakfast and one tablet at dinner Farxiga 10 mg: one tablet before breakfast Rosuvastatin (CRESTOR) 10 mg: one tablet at breakfast Finasteride ([name]) 5 mg: one tablet before breakfast Carvedilol (COREG) 3.125 mg: one tablet  at breakfast and one tablet at dinner Omeprazole 20 mg : take one tablet by mouth as needed. Faith Rogue)  Patient will need a short fill: None  Coordinated acute fill: None  Patient declined the following medications:None  Confirmed pick up date of 08/23/2021. Care Gaps: Zoster Vaccine- Overdue Flu Vaccine - Overdue AWV - completed 12/28/2020  Star Rating Drugs: Wilder Glade 10mg  last filled 06/19/2021 30DS at [facility] Rosuvastatin 10 mg last filled 06/19/2021 at [facility] Entresto 24mg -26mg  last filled 04/16/2021 at [facility] Pharmacist Assistant [phone]

## 2021-08-13 ENCOUNTER — Encounter (HOSPITAL_COMMUNITY): Payer: Self-pay

## 2021-08-13 ENCOUNTER — Ambulatory Visit (HOSPITAL_COMMUNITY)
Admission: RE | Admit: 2021-08-13 | Discharge: 2021-08-13 | Disposition: A | Discharge status: Home / Self Care | Payer: Medicare PPO | Source: Ambulatory Visit | Attending: Family Medicine | Admitting: Family Medicine

## 2021-08-13 VITALS — BP 118/70 | HR 55 | Wt 182.6 lb

## 2021-08-13 DIAGNOSIS — I130 Hypertensive heart and chronic kidney disease with heart failure and stage 1 through stage 4 chronic kidney disease, or unspecified chronic kidney disease: Secondary | ICD-10-CM | POA: Insufficient documentation

## 2021-08-13 DIAGNOSIS — I5032 Chronic diastolic (congestive) heart failure: Secondary | ICD-10-CM | POA: Insufficient documentation

## 2021-08-13 DIAGNOSIS — G931 Anoxic brain damage, not elsewhere classified: Secondary | ICD-10-CM | POA: Diagnosis not present

## 2021-08-13 DIAGNOSIS — Z8674 Personal history of sudden cardiac arrest: Secondary | ICD-10-CM | POA: Insufficient documentation

## 2021-08-13 DIAGNOSIS — Z7982 Long term (current) use of aspirin: Secondary | ICD-10-CM | POA: Diagnosis not present

## 2021-08-13 DIAGNOSIS — N1832 Chronic kidney disease, stage 3b: Secondary | ICD-10-CM | POA: Insufficient documentation

## 2021-08-13 DIAGNOSIS — I2510 Atherosclerotic heart disease of native coronary artery without angina pectoris: Secondary | ICD-10-CM | POA: Diagnosis not present

## 2021-08-13 DIAGNOSIS — Z79899 Other long term (current) drug therapy: Secondary | ICD-10-CM | POA: Diagnosis not present

## 2021-08-13 DIAGNOSIS — Z955 Presence of coronary angioplasty implant and graft: Secondary | ICD-10-CM | POA: Diagnosis not present

## 2021-08-13 DIAGNOSIS — E785 Hyperlipidemia, unspecified: Secondary | ICD-10-CM | POA: Diagnosis not present

## 2021-08-13 LAB — BASIC METABOLIC PANEL
Anion gap: 7 (ref 5–15)
BUN: 28 mg/dL — ABNORMAL HIGH (ref 8–23)
CO2: 26 mmol/L (ref 22–32)
Calcium: 8.8 mg/dL — ABNORMAL LOW (ref 8.9–10.3)
Chloride: 104 mmol/L (ref 98–111)
Creatinine, Ser: 1.82 mg/dL — ABNORMAL HIGH (ref 0.61–1.24)
GFR, Estimated: 39 mL/min — ABNORMAL LOW (ref >60)
Glucose, Bld: 101 mg/dL — ABNORMAL HIGH (ref 70–99)
Potassium: 4.4 mmol/L (ref 3.5–5.1)
Sodium: 137 mmol/L (ref 135–145)

## 2021-08-13 NOTE — Patient Instructions (Signed)
No medication changes!  Labs today We will only contact you if something comes back abnormal or we need to make some changes. Otherwise no news is good news!  Do the following things EVERYDAY: Weigh yourself in the morning before breakfast. Write it down and keep it in a log. Take your medicines as prescribed Eat low salt foods--Limit salt (sodium) to 2000 mg per day.  Stay as active as you can everyday Limit all fluids for the day to less than 2 liters  Your physician recommends that you schedule a follow-up appointment in: 5 months with Dr Haroldine Laws.  Please call in february to schedule your appointment.    Please call office at [phone] option [phone] if you have any questions or concerns.    At the [facility], you and your health needs are our priority. As part of our continuing mission to provide you with exceptional heart care, we have created designated Provider Care Teams. These Care Teams include your primary Cardiologist (physician) and Advanced Practice Providers (APPs- Physician Assistants and Nurse Practitioners) who all work together to provide you with the care you need, when you need it.   You may see any of the following providers on your designated Care Team at your next follow up: Dr Glori Bickers Dr Haynes Kerns, NP Lyda Jester, [place] [facility] [place], [place] Audry Riles, PharmD   Please be sure to bring in all your medications bottles to every appointment.

## 2021-08-14 ENCOUNTER — Encounter (HOSPITAL_COMMUNITY): Payer: Medicare PPO

## 2021-08-16 ENCOUNTER — Encounter (HOSPITAL_COMMUNITY): Payer: Medicare PPO

## 2021-08-19 ENCOUNTER — Encounter (HOSPITAL_COMMUNITY)
Admission: RE | Admit: 2021-08-19 | Discharge: 2021-08-19 | Disposition: A | Discharge status: Home / Self Care | Payer: Medicare PPO | Source: Ambulatory Visit | Attending: Internal Medicine | Admitting: Internal Medicine

## 2021-08-19 ENCOUNTER — Encounter (HOSPITAL_COMMUNITY): Payer: Medicare PPO

## 2021-08-19 ENCOUNTER — Other Ambulatory Visit: Payer: Self-pay

## 2021-08-19 DIAGNOSIS — I2111 ST elevation (STEMI) myocardial infarction involving right coronary artery: Secondary | ICD-10-CM | POA: Diagnosis not present

## 2021-08-19 DIAGNOSIS — Z955 Presence of coronary angioplasty implant and graft: Secondary | ICD-10-CM

## 2021-08-20 NOTE — Progress Notes (Signed)
Cardiac Individual Treatment Plan  Patient Details  Name: [name] MRN: [id number] Date of Birth: [birth year] Referring Provider:   Flowsheet Row CARDIAC REHAB PHASE II ORIENTATION from 07/02/2021 in [facility]  Referring Provider Glori Bickers, MD       Initial Encounter Date:  [facility] PHASE II ORIENTATION from 07/02/2021 in [facility]  Date 07/02/21       Visit Diagnosis: ST elevation myocardial infarction involving right coronary artery (HCC)  S/P drug eluting coronary stent placement  Patient's Home Medications on Admission:  Current Outpatient Medications:    acetaminophen (TYLENOL) 500 MG tablet, Take 1,000 mg by mouth every 6 (six) hours as needed for mild pain., Disp: , Rfl:    aspirin 81 MG chewable tablet, Chew 1 tablet (81 mg total) by mouth daily., Disp: 90 tablet, Rfl: 0   BRILINTA 90 MG TABS tablet, TAKE ONE TABLET BY MOUTH EVERY MORNING and TAKE ONE TABLET BY MOUTH EVERY EVENING, Disp: 120 tablet, Rfl: 1   carvedilol (COREG) 3.125 MG tablet, Take 1 tablet (3.125 mg total) by mouth 2 (two) times daily with a meal., Disp: 180 tablet, Rfl: 1   dapagliflozin propanediol (FARXIGA) 10 MG TABS tablet, Take 1 tablet (10 mg total) by mouth daily before breakfast., Disp: 30 tablet, Rfl: 11   diclofenac Sodium (VOLTAREN) 1 % GEL, Apply 2 g topically 2 (two) times daily as needed (pain)., Disp: , Rfl:    finasteride ([name]) 5 MG tablet, Take 1 tablet (5 mg total) by mouth daily., Disp: 90 tablet, Rfl: 0   ketoconazole (NIZORAL) 2 % cream, Apply 1 application topically daily as needed for irritation., Disp: , Rfl:    LORazepam (ATIVAN) 2 MG tablet, TAKE 1 TABLET 6 HOURS AS NEEDED FOR ANXIETY. (DOSE CHANGE, FILL 10/14/20 BASED ON PREV PICKUP), Disp: 90 tablet, Rfl: 5   nitroGLYCERIN (NITROSTAT) 0.4 MG SL tablet, Place 1 tablet (0.4 mg total) under the tongue every 5 (five) minutes as  needed for chest pain., Disp: 50 tablet, Rfl: 3   omeprazole (PRILOSEC) 20 MG capsule, Take 1 capsule (20 mg total) by mouth as needed., Disp: 30 capsule, Rfl: 11   rosuvastatin (CRESTOR) 10 MG tablet, Take 1 tablet (10 mg total) by mouth daily., Disp: 90 tablet, Rfl: 3   sacubitril-valsartan (ENTRESTO) 24-26 MG, Take 1 tablet by mouth 2 (two) times daily., Disp: 60 tablet, Rfl: 11   triamcinolone cream (KENALOG) 0.1 %, Apply 1 application topically 2 (two) times daily as needed (psoriasis)., Disp: , Rfl:   Past Medical History: Past Medical History:  Diagnosis Date   Anxiety    Arthritis    Cancer ([place]) 2010   basal cell ca, head   Cataract    bilateral   Depression    ED (erectile dysfunction)    GERD (gastroesophageal reflux disease)    Hyperlipidemia    Hypertension    Kidney stone 07-29-11   passed    Neuromuscular disorder ([place])    neuropathy legs   Vitreous floater    left eye, sees Dr. Dawna Part at [place] Use: Social History   Tobacco Use  Smoking Status Never  Smokeless Tobacco Never    Labs: Recent Review Flowsheet Data     Labs for ITP Cardiac and Pulmonary Rehab Latest Ref Rng & Units 01/22/2021 01/23/2021 01/24/2021 01/25/2021 05/30/2021   Cholestrol 0 - 200 mg/dL - - - - 119  LDLCALC 0 - 99 mg/dL - - - - 56   LDLDIRECT mg/dL - - - - -   HDL >39.00 mg/dL - - - - 45.10   Trlycerides 0.0 - 149.0 mg/dL - - - - 90.0   Hemoglobin A1c 4.6 - 6.5 % - - - - 6.0   PHART 7.350 - 7.450 - - - - -   PCO2ART 32.0 - 48.0 mmHg - - - - -   HCO3 20.0 - 28.0 mmol/L - - - - -   TCO2 22 - 32 mmol/L - - - - -   ACIDBASEDEF 0.0 - 2.0 mmol/L - - - - -   O2SAT % 74.2 70.8 68.0 62.4 -       Capillary Blood Glucose: Lab Results  Component Value Date   GLUCAP 92 02/07/2021   GLUCAP 86 02/07/2021   GLUCAP 96 02/06/2021   GLUCAP 132 (H) 02/06/2021   GLUCAP 94 02/06/2021     Exercise Target Goals: Exercise Program Goal: Individual exercise prescription  set using results from initial 6 min walk test and THRR while considering  patient's activity barriers and safety.   Exercise Prescription Goal: Initial exercise prescription builds to 30-45 minutes a day of aerobic activity, 2-3 days per week.  Home exercise guidelines will be given to patient during program as part of exercise prescription that the participant will acknowledge.  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 HR 89 bpm     Max Ex. BP 136/68     2 Minute Post BP 120/70              Oxygen Initial Assessment:   Oxygen Re-Evaluation:   Oxygen Discharge (Final Oxygen Re-Evaluation):   Initial Exercise Prescription:  Initial Exercise Prescription - 07/02/21 1500       Date of Initial Exercise RX and Referring Provider   Date 07/02/21    Referring Provider Glori Bickers, MD    Expected Discharge Date 09/06/21      NuStep   Level 2    SPM 75    Minutes 15    METs 2      Arm Ergometer   Level 1.5    RPM 60    Minutes 15    METs 2      Prescription Details   Frequency (times per week) 3  Duration Progress to 30 minutes of continuous aerobic without signs/symptoms of physical distress      Intensity   THRR 40-80% of Max  Heartrate 60-120    Ratings of Perceived Exertion 11-13    Perceived Dyspnea 0-4      Progression   Progression Continue progressive overload as per policy without signs/symptoms or physical distress.      Resistance Training   Training Prescription Yes    Weight 3 lbs    Reps 10-15             Perform Capillary Blood Glucose checks as needed.  Exercise Prescription Changes:   Exercise Prescription Changes     Row Name 07/08/21 1400 07/22/21 1500 07/24/21 1458 07/31/21 1450 08/12/21 1500     Response to Exercise   Blood Pressure (Admit) 130/80 104/56 102/54 108/58 120/60   Blood Pressure (Exercise) 124/72 154/62 114/60 124/66 122/72   Blood Pressure (Exit) 118/70 112/74 116/60 96/56  Asymptomatic: H2O, gthen Rechk 106/60 104/70   Heart Rate (Admit) 65 bpm 75 bpm 69 bpm 71 bpm 67 bpm   Heart Rate (Exercise) 75 bpm 88 bpm 85 bpm 89 bpm 101 bpm   Heart Rate (Exit) 57 bpm 65 bpm 57 bpm 59 bpm 67 bpm   Rating of Perceived Exertion (Exercise) 12 12 12 12 12    Symptoms None None None None None   Comments Pt's first day in the CRP2 program Reviewed METs Reviewed Home exercise Rx Reviewed METs and Goals Reviewed METs   Duration Continue with 30 min of aerobic exercise without signs/symptoms of physical distress. Continue with 30 min of aerobic exercise without signs/symptoms of physical distress. Continue with 30 min of aerobic exercise without signs/symptoms of physical distress. Continue with 30 min of aerobic exercise without signs/symptoms of physical distress. Continue with 30 min of aerobic exercise without signs/symptoms of physical distress.   Intensity THRR unchanged THRR unchanged THRR New THRR unchanged THRR unchanged     Progression   Progression Continue to progress workloads to maintain intensity without signs/symptoms of physical distress. Continue to progress workloads to maintain intensity without signs/symptoms of physical distress. Continue to progress workloads to  maintain intensity without signs/symptoms of physical distress. Continue to progress workloads to maintain intensity without signs/symptoms of physical distress. Continue to progress workloads to maintain intensity without signs/symptoms of physical distress.   Average METs 2.1 2.8 3 3.3 3.6     Resistance Training   Training Prescription Yes Yes Yes No Yes   Weight 3 lbs 3 lbs 3 lbs No weights on Wednesdays 4 lbs   Reps 10-15 10-15 10-15 -- 10-15   Time 10 Minutes 10 Minutes 10 Minutes -- 10 Minutes     Interval Training   Interval Training No No No No No     NuStep   Level 2 2 3 3 4    SPM 75 85 85 100 100   Minutes 15 15 15 15 15    METs 2.1 2.6 2.8 3.4 3.8     Arm Ergometer   Level 2 -- -- -- --   RPM 60 -- -- -- --   Minutes 15 -- -- -- --     Track   Laps -- 18 19 19 20    Minutes -- 15 15 15 15    METs -- 3.09 3.2 3.2 3.32     Home Exercise Plan   Plans to continue exercise at -- -- Home (comment) Home (comment) Home (comment)   Frequency -- --  Add 2 additional days to program exercise sessions. Add 2 additional days to program exercise sessions. Add 2 additional days to program exercise sessions.   Initial Home Exercises Provided -- -- 07/24/21 07/24/21 07/24/21            Exercise Comments:   Exercise Comments     Row Name 07/08/21 1448 07/22/21 1500 07/24/21 1502 08/02/21 1131 08/12/21 1514   Exercise Comments Pt's first day in the CRP2 program. Pt tolerated the session well with no complaints. Reviewed METs. Pt progressing well. Will progress on nustep next session. Pt voiced desire to discontinue arm ergometer so track was added to routine. Pt tolerated track well. Reviewed home exercise Rx with patient. Pt verbalized understanding of the Rx and was provided a copy. Reviewed METs and Goals. Pt is making good progress in the CRP2 program. Peak METs are 3.4. Reviewed METs. Pt is making good progress.            Exercise Goals and Review:   Exercise Goals      Row Name 07/02/21 1523             Exercise Goals   Increase Physical Activity Yes       Intervention Provide advice, education, support and counseling about physical activity/exercise needs.;Develop an individualized exercise prescription for aerobic and resistive training based on initial evaluation findings, risk stratification, comorbidities and participant's personal goals.       Expected Outcomes Short Term: Attend rehab on a regular basis to increase amount of physical activity.;Long Term: Add in home exercise to make exercise part of routine and to increase amount of physical activity.;Long Term: Exercising regularly at least 3-5 days a week.       Increase Strength and Stamina Yes       Intervention Provide advice, education, support and counseling about physical activity/exercise needs.;Develop an individualized exercise prescription for aerobic and resistive training based on initial evaluation findings, risk stratification, comorbidities and participant's personal goals.       Expected Outcomes Short Term: Increase workloads from initial exercise prescription for resistance, speed, and METs.;Short Term: Perform resistance training exercises routinely during rehab and add in resistance training at home;Long Term: Improve cardiorespiratory fitness, muscular endurance and strength as measured by increased METs and functional capacity (6MWT)       Able to understand and use rate of perceived exertion (RPE) scale Yes       Intervention Provide education and explanation on how to use RPE scale       Expected Outcomes Long Term:  Able to use RPE to guide intensity level when exercising independently;Short Term: Able to use RPE daily in rehab to express subjective intensity level       Knowledge and understanding of Target Heart Rate Range (THRR) Yes       Intervention Provide education and explanation of THRR including how the numbers were predicted and where they are located for reference        Expected Outcomes Short Term: Able to state/look up THRR;Long Term: Able to use THRR to govern intensity when exercising independently;Short Term: Able to use daily as guideline for intensity in rehab       Understanding of Exercise Prescription Yes       Intervention Provide education, explanation, and written materials on patient's individual exercise prescription       Expected Outcomes Short Term: Able to explain program exercise prescription;Long Term: Able to explain home exercise prescription to exercise independently  Exercise Goals Re-Evaluation :  Exercise Goals Re-Evaluation     Row Name 07/08/21 1446 07/24/21 1500 07/31/21 1500         Exercise Goal Re-Evaluation   Exercise Goals Review Increase Physical Activity;Increase Strength and Stamina;Able to understand and use rate of perceived exertion (RPE) scale;Knowledge and understanding of Target Heart Rate Range (THRR);Understanding of Exercise Prescription Increase Physical Activity;Increase Strength and Stamina;Able to understand and use rate of perceived exertion (RPE) scale;Knowledge and understanding of Target Heart Rate Range (THRR);Able to check pulse independently;Understanding of Exercise Prescription Increase Physical Activity;Increase Strength and Stamina;Able to understand and use rate of perceived exertion (RPE) scale;Knowledge and understanding of Target Heart Rate Range (THRR);Understanding of Exercise Prescription     Comments Pt's first day in the CRP2 program. Pt understnads the exercise Rx, THRR, and RPE scale. Reviewed Home exercise Rx. Pt walking the dog at home. Encouraged to just walk himself for 30 minutes 2 x/week. Reviewed METs and goals. Pt does feel that his strength and stamina are improving. Pt is able to walk longer distances.     Expected Outcomes Will continue to monitor patient and progress exercise workloads as tolerated. Pt will walk at home on off days form the CRP2 program.  Will continue to monitor patient and progress exercise workloads as tolerated.              Discharge Exercise Prescription (Final Exercise Prescription Changes):  Exercise Prescription Changes - 08/12/21 1500       Response to Exercise   Blood Pressure (Admit) 120/60    Blood Pressure (Exercise) 122/72    Blood Pressure (Exit) 104/70    Heart Rate (Admit) 67 bpm    Heart Rate (Exercise) 101 bpm    Heart Rate (Exit) 67 bpm    Rating of Perceived Exertion (Exercise) 12    Symptoms None    Comments Reviewed METs    Duration Continue with 30 min of aerobic exercise without signs/symptoms of physical distress.    Intensity THRR unchanged      Progression   Progression Continue to progress workloads to maintain intensity without signs/symptoms of physical distress.    Average METs 3.6      Resistance Training   Training Prescription Yes    Weight 4 lbs    Reps 10-15    Time 10 Minutes      Interval Training   Interval Training No      NuStep   Level 4    SPM 100    Minutes 15    METs 3.8      Track   Laps 20    Minutes 15    METs 3.32      Home Exercise Plan   Plans to continue exercise at Home (comment)    Frequency Add 2 additional days to program exercise sessions.    Initial Home Exercises Provided 07/24/21             Nutrition:  Target Goals: Understanding of nutrition guidelines, daily intake of sodium 1500mg , cholesterol 200mg , calories 30% from fat and 7% or less from saturated fats, daily to have 5 or more servings of fruits and vegetables.  Biometrics:  Pre Biometrics - 07/02/21 1315       Pre Biometrics   Waist Circumference 38.75 inches    Hip Circumference 39 inches    Waist to Hip Ratio 0.99 %    Triceps Skinfold 11 mm    % Body Fat 22.5 %  Grip Strength 40 kg    Flexibility 0 in   unable to reach   Single Leg Stand 20.93 seconds              Nutrition Therapy Plan and Nutrition Goals:   Nutrition  Assessments:  MEDIFICTS Score Key: ?70 Need to make dietary changes  40-70 Heart Healthy Diet ? 40 Therapeutic Level Cholesterol Diet    Picture Your Plate Scores: <51 Unhealthy dietary pattern with much room for improvement. 41-50 Dietary pattern unlikely to meet recommendations for good health and room for improvement. 51-60 More healthful dietary pattern, with some room for improvement.  >60 Healthy dietary pattern, although there may be some specific behaviors that could be improved.    Nutrition Goals Re-Evaluation:   Nutrition Goals Re-Evaluation:   Nutrition Goals Discharge (Final Nutrition Goals Re-Evaluation):   Psychosocial: Target Goals: Acknowledge presence or absence of significant depression and/or stress, maximize coping skills, provide positive support system. Participant is able to verbalize types and ability to use techniques and skills needed for reducing stress and depression.  Initial Review & Psychosocial Screening:  Initial Psych Review & Screening - 08/20/21 1513       Initial Review   Source of Stress Concerns Chronic Illness;Unable to participate in former interests or hobbies;Unable to perform yard/household activities    Comments Will continue to monitor and offer support as needed.             Quality of Life Scores:  Quality of Life - 07/02/21 1517       Quality of Life   Select Quality of Life      Quality of Life Scores   Health/Function Pre 26.13 %    Socioeconomic Pre 24.79 %    Psych/Spiritual Pre 23.79 %    Family Pre 21.33 %    GLOBAL Pre 24.88 %            Scores of 19 and below usually indicate a poorer quality of life in these areas.  A difference of  2-3 points is a clinically meaningful difference.  A difference of 2-3 points in the total score of the Quality of Life Index has been associated with significant improvement in overall quality of life, self-image, physical symptoms, and general health in studies  assessing change in quality of life.  PHQ-9: Recent Review Flowsheet Data     Depression screen [facility] 2/9 07/02/2021 05/30/2021 02/26/2021 12/28/2020 12/28/2020   Decreased Interest 0 0 0 0 0   Down, Depressed, Hopeless 0 0 0 0 0   PHQ - 2 Score 0 0 0 0 0   Altered sleeping 2 1 - - -   Tired, decreased energy 1 0 - - -   Change in appetite 1 0 - - -   Feeling bad or failure about yourself  0 0 - - -   Trouble concentrating 0 0 - - -   Moving slowly or fidgety/restless 1 0 - - -   Suicidal thoughts 0 0 - - -   PHQ-9 Score 5 1 - - -   Difficult doing work/chores Somewhat difficult Somewhat difficult - - -      Interpretation of Total Score  Total Score Depression Severity:  1-4 = Minimal depression, 5-9 = Mild depression, 10-14 = Moderate depression, 15-19 = Moderately severe depression, 20-27 = Severe depression   Psychosocial Evaluation and Intervention:   Psychosocial Re-Evaluation:  Psychosocial Re-Evaluation     Row Name 07/23/21 1514 08/20/21 1513  Psychosocial Re-Evaluation   Current issues with Current Sleep Concerns;History of Depression;Current Stress Concerns Current Stress Concerns;Current Anxiety/Panic;Current Sleep Concerns      Comments [name] reports that continues to have problems with insomnia at times. [name] has not voiced any increased stressors since participating in phase 2 cardiac rehab. [name] has not voiced any increased concerns or stresors since participating in phase 2 cardiac rehab      Expected [place] will have decreased stressors upon completion of phase 2 cardiac rehab. [name] will have decreased stress concerns upon completion of phase 2 cardiac rehab      Interventions Stress management education;Encouraged to attend Cardiac Rehabilitation for the exercise;Relaxation education Stress management education;Relaxation education;Encouraged to attend Cardiac Rehabilitation for the exercise      Continue Psychosocial Services  No Follow up  required No Follow up required        Initial Review   Source of Stress Concerns Chronic Illness;Unable to participate in former interests or hobbies;Unable to perform yard/household activities --      Comments Will continue to monitor and offer support as needed. --               Psychosocial Discharge (Final Psychosocial Re-Evaluation):  Psychosocial Re-Evaluation - 08/20/21 1513       Psychosocial Re-Evaluation   Current issues with Current Stress Concerns;Current Anxiety/Panic;Current Sleep Concerns    Comments [name] has not voiced any increased concerns or stresors since participating in phase 2 cardiac rehab    Expected [place] will have decreased stress concerns upon completion of phase 2 cardiac rehab    Interventions Stress management education;Relaxation education;Encouraged to attend Cardiac Rehabilitation for the exercise    Continue Psychosocial Services  No Follow up required             Vocational Rehabilitation: Provide vocational rehab assistance to qualifying candidates.   Vocational Rehab Evaluation & Intervention:  Vocational Rehab - 07/02/21 1616       Initial Vocational Rehab Evaluation & Intervention   Assessment shows need for Vocational Rehabilitation No             Education: Education Goals: Education classes will be provided on a weekly basis, covering required topics. Participant will state understanding/return demonstration of topics presented.  Learning Barriers/Preferences:  Learning Barriers/Preferences - 07/02/21 1518       Learning Barriers/Preferences   Learning Barriers Sight   wears glasses   Learning Preferences Audio;Computer/Internet;Group Instruction;Individual Instruction;Pictoral;Skilled Demonstration;Verbal Instruction;Video;Written Material             Education Topics: Count Your Pulse:  -Group instruction provided by verbal instruction, demonstration, patient participation and written materials to  support subject.  Instructors address importance of being able to find your pulse and how to count your pulse when at home without a heart monitor.  Patients get hands on experience counting their pulse with staff help and individually.   Heart Attack, Angina, and Risk Factor Modification:  -Group instruction provided by verbal instruction, video, and written materials to support subject.  Instructors address signs and symptoms of angina and heart attacks.    Also discuss risk factors for heart disease and how to make changes to improve heart health risk factors.   Functional Fitness:  -Group instruction provided by verbal instruction, demonstration, patient participation, and written materials to support subject.  Instructors address safety measures for doing things around the house.  Discuss how to get up and down off the floor, how to pick things up properly, how  to safely get out of a chair without assistance, and balance training.   Meditation and Mindfulness:  -Group instruction provided by verbal instruction, patient participation, and written materials to support subject.  Instructor addresses importance of mindfulness and meditation practice to help reduce stress and improve awareness.  Instructor also leads participants through a meditation exercise.    Stretching for Flexibility and Mobility:  -Group instruction provided by verbal instruction, patient participation, and written materials to support subject.  Instructors lead participants through series of stretches that are designed to increase flexibility thus improving mobility.  These stretches are additional exercise for major muscle groups that are typically performed during regular warm up and cool down.   Hands Only CPR:  -Group verbal, video, and participation provides a basic overview of AHA guidelines for community CPR. Role-play of emergencies allow participants the opportunity to practice calling for help and chest  compression technique with discussion of AED use.   Hypertension: -Group verbal and written instruction that provides a basic overview of hypertension including the most recent diagnostic guidelines, risk factor reduction with self-care instructions and medication management.    Nutrition I class: Heart Healthy Eating:  -Group instruction provided by PowerPoint slides, verbal discussion, and written materials to support subject matter. The instructor gives an explanation and review of the Therapeutic Lifestyle Changes diet recommendations, which includes a discussion on lipid goals, dietary fat, sodium, fiber, plant stanol/sterol esters, sugar, and the components of a well-balanced, healthy diet.   Nutrition II class: Lifestyle Skills:  -Group instruction provided by PowerPoint slides, verbal discussion, and written materials to support subject matter. The instructor gives an explanation and review of label reading, grocery shopping for heart health, heart healthy recipe modifications, and ways to make healthier choices when eating out.   Diabetes Question & Answer:  -Group instruction provided by PowerPoint slides, verbal discussion, and written materials to support subject matter. The instructor gives an explanation and review of diabetes co-morbidities, pre- and post-prandial blood glucose goals, pre-exercise blood glucose goals, signs, symptoms, and treatment of hypoglycemia and hyperglycemia, and foot care basics.   Diabetes Blitz:  -Group instruction provided by PowerPoint slides, verbal discussion, and written materials to support subject matter. The instructor gives an explanation and review of the physiology behind type 1 and type 2 diabetes, diabetes medications and rational behind using different medications, pre- and post-prandial blood glucose recommendations and Hemoglobin A1c goals, diabetes diet, and exercise including blood glucose guidelines for exercising safely.    Portion  Distortion:  -Group instruction provided by PowerPoint slides, verbal discussion, written materials, and food models to support subject matter. The instructor gives an explanation of serving size versus portion size, changes in portions sizes over the last 20 years, and what consists of a serving from each food group.   Stress Management:  -Group instruction provided by verbal instruction, video, and written materials to support subject matter.  Instructors review role of stress in heart disease and how to cope with stress positively.     Exercising on Your Own:  -Group instruction provided by verbal instruction, power point, and written materials to support subject.  Instructors discuss benefits of exercise, components of exercise, frequency and intensity of exercise, and end points for exercise.  Also discuss use of nitroglycerin and activating EMS.  Review options of places to exercise outside of rehab.  Review guidelines for sex with heart disease.   Cardiac Drugs I:  -Group instruction provided by verbal instruction and written materials to support subject.  Instructor reviews cardiac drug classes: antiplatelets, anticoagulants, beta blockers, and statins.  Instructor discusses reasons, side effects, and lifestyle considerations for each drug class.   Cardiac Drugs II:  -Group instruction provided by verbal instruction and written materials to support subject.  Instructor reviews cardiac drug classes: angiotensin converting enzyme inhibitors (ACE-I), angiotensin II receptor blockers (ARBs), nitrates, and calcium channel blockers.  Instructor discusses reasons, side effects, and lifestyle considerations for each drug class.   Anatomy and Physiology of the Circulatory System:  Group verbal and written instruction and models provide basic cardiac anatomy and physiology, with the coronary electrical and arterial systems. Review of: AMI, Angina, Valve disease, Heart Failure, Peripheral Artery  Disease, Cardiac Arrhythmia, Pacemakers, and the ICD.   Other Education:  -Group or individual verbal, written, or video instructions that support the educational goals of the cardiac rehab program.   Holiday Eating Survival Tips:  -Group instruction provided by PowerPoint slides, verbal discussion, and written materials to support subject matter. The instructor gives patients tips, tricks, and techniques to help them not only survive but enjoy the holidays despite the onslaught of food that accompanies the holidays.   Knowledge Questionnaire Score:  Knowledge Questionnaire Score - 07/02/21 1518       Knowledge Questionnaire Score   Pre Score 20/24             Core Components/Risk Factors/Patient Goals at Admission:  Personal Goals and Risk Factors at Admission - 07/02/21 1519       Core Components/Risk Factors/Patient Goals on Admission    Weight Management Weight Maintenance    Heart Failure Yes    Intervention Provide a combined exercise and nutrition program that is supplemented with education, support and counseling about heart failure. Directed toward relieving symptoms such as shortness of breath, decreased exercise tolerance, and extremity edema.    Expected Outcomes Long term: Adoption of self-care skills and reduction of barriers for early signs and symptoms recognition and intervention leading to self-care maintenance.;Short term: Daily weights obtained and reported for increase. Utilizing diuretic protocols set by physician.;Short term: Attendance in program 2-3 days a week with increased exercise capacity. Reported lower sodium intake. Reported increased fruit and vegetable intake. Reports medication compliance.;Improve functional capacity of life    Hypertension Yes    Intervention Provide education on lifestyle modifcations including regular physical activity/exercise, weight management, moderate sodium restriction and increased consumption of fresh fruit, vegetables,  and low fat dairy, alcohol moderation, and smoking cessation.;Monitor prescription use compliance.    Expected Outcomes Short Term: Continued assessment and intervention until BP is < 140/31mm HG in hypertensive participants. < 130/21mm HG in hypertensive participants with diabetes, heart failure or chronic kidney disease.;Long Term: Maintenance of blood pressure at goal levels.    Lipids Yes    Intervention Provide education and support for participant on nutrition & aerobic/resistive exercise along with prescribed medications to achieve LDL 70mg , HDL >40mg .    Expected Outcomes Short Term: Participant states understanding of desired cholesterol values and is compliant with medications prescribed. Participant is following exercise prescription and nutrition guidelines.;Long Term: Cholesterol controlled with medications as prescribed, with individualized exercise RX and with personalized nutrition plan. Value goals: LDL < 70mg , HDL > 40 mg.             Core Components/Risk Factors/Patient Goals Review:   Goals and Risk Factor Review     Row Name 07/23/21 1517 08/20/21 1517           Core Components/Risk Factors/Patient Goals Review  Personal Goals Review Weight Management/Obesity;Heart Failure;Stress;Hypertension;Lipids Weight Management/Obesity;Heart Failure;Stress;Hypertension;Lipids      Review [name] has been doing well with exercise at cardiac rehab. Vital signs and weight have been stable. [name] continues to  do well with exercise at cardiac rehab. Vital signs  have been stable. [name] is enjoying participating in phase 2 cardiac rehab and feels stronger.      Expected Outcomes [name] will continue to participate in phase 2 cardiac rehab for exercise, nutrition and lifestyle modifications [name] will continue to participate in phase 2 cardiac rehab for exercise, nutrition and lifestyle modifications               Core Components/Risk Factors/Patient Goals at Discharge (Final  Review):   Goals and Risk Factor Review - 08/20/21 1517       Core Components/Risk Factors/Patient Goals Review   Personal Goals Review Weight Management/Obesity;Heart Failure;Stress;Hypertension;Lipids    Review [name] continues to  do well with exercise at cardiac rehab. Vital signs  have been stable. [name] is enjoying participating in phase 2 cardiac rehab and feels stronger.    Expected Outcomes [name] will continue to participate in phase 2 cardiac rehab for exercise, nutrition and lifestyle modifications             ITP Comments:  ITP Comments     Row Name 07/23/21 1512 08/20/21 1511         ITP Comments 30 Day ITP Review. [name] has good attendance and participaiton. [name] is off to a good start to exercise. 30 Day ITP Review. [name] has good attendance and participaiton in phase 2 cardiac rehab. [name] is scheduled to complete cardiac rehab on 08/30/21.               Comments: See ITP Comments

## 2021-08-21 ENCOUNTER — Encounter (HOSPITAL_COMMUNITY): Payer: Medicare PPO

## 2021-08-21 ENCOUNTER — Encounter (HOSPITAL_COMMUNITY)
Admission: RE | Admit: 2021-08-21 | Discharge: 2021-08-21 | Disposition: A | Discharge status: Home / Self Care | Payer: Medicare PPO | Source: Ambulatory Visit | Attending: Internal Medicine | Admitting: Internal Medicine

## 2021-08-21 ENCOUNTER — Other Ambulatory Visit: Payer: Self-pay

## 2021-08-21 DIAGNOSIS — I2111 ST elevation (STEMI) myocardial infarction involving right coronary artery: Secondary | ICD-10-CM | POA: Diagnosis not present

## 2021-08-21 DIAGNOSIS — Z955 Presence of coronary angioplasty implant and graft: Secondary | ICD-10-CM | POA: Diagnosis not present

## 2021-08-22 ENCOUNTER — Telehealth: Payer: Medicare PPO

## 2021-08-23 ENCOUNTER — Other Ambulatory Visit: Payer: Self-pay

## 2021-08-23 ENCOUNTER — Encounter (HOSPITAL_COMMUNITY)
Admission: RE | Admit: 2021-08-23 | Discharge: 2021-08-23 | Disposition: A | Discharge status: Home / Self Care | Payer: Medicare PPO | Source: Ambulatory Visit | Attending: Internal Medicine | Admitting: Internal Medicine

## 2021-08-23 ENCOUNTER — Encounter (HOSPITAL_COMMUNITY): Payer: Medicare PPO

## 2021-08-23 ENCOUNTER — Telehealth: Payer: Medicare PPO

## 2021-08-23 DIAGNOSIS — I2111 ST elevation (STEMI) myocardial infarction involving right coronary artery: Secondary | ICD-10-CM | POA: Insufficient documentation

## 2021-08-23 DIAGNOSIS — Z955 Presence of coronary angioplasty implant and graft: Secondary | ICD-10-CM | POA: Diagnosis not present

## 2021-08-26 ENCOUNTER — Encounter (HOSPITAL_COMMUNITY): Payer: Medicare PPO

## 2021-08-26 ENCOUNTER — Encounter (HOSPITAL_COMMUNITY)
Admission: RE | Admit: 2021-08-26 | Discharge: 2021-08-26 | Disposition: A | Discharge status: Home / Self Care | Payer: Medicare PPO | Source: Ambulatory Visit | Attending: Internal Medicine | Admitting: Internal Medicine

## 2021-08-26 ENCOUNTER — Other Ambulatory Visit: Payer: Self-pay

## 2021-08-26 VITALS — Ht 72.0 in | Wt 184.5 lb

## 2021-08-26 DIAGNOSIS — Z955 Presence of coronary angioplasty implant and graft: Secondary | ICD-10-CM | POA: Diagnosis not present

## 2021-08-26 DIAGNOSIS — I2111 ST elevation (STEMI) myocardial infarction involving right coronary artery: Secondary | ICD-10-CM | POA: Diagnosis not present

## 2021-08-28 ENCOUNTER — Encounter (HOSPITAL_COMMUNITY): Payer: Medicare PPO

## 2021-08-28 ENCOUNTER — Other Ambulatory Visit: Payer: Self-pay

## 2021-08-28 ENCOUNTER — Encounter (HOSPITAL_COMMUNITY)
Admission: RE | Admit: 2021-08-28 | Discharge: 2021-08-28 | Disposition: A | Discharge status: Home / Self Care | Payer: Medicare PPO | Source: Ambulatory Visit | Attending: Internal Medicine | Admitting: Internal Medicine

## 2021-08-28 ENCOUNTER — Telehealth: Payer: Self-pay | Admitting: *Deleted

## 2021-08-28 DIAGNOSIS — I2111 ST elevation (STEMI) myocardial infarction involving right coronary artery: Secondary | ICD-10-CM

## 2021-08-28 DIAGNOSIS — Z955 Presence of coronary angioplasty implant and graft: Secondary | ICD-10-CM

## 2021-08-28 NOTE — Chronic Care Management (AMB) (Signed)
  Care Management   Note  08/28/2021 Name: [name] MRN: [id number] DOB: January 01, 1951  [name] is a 70 y.o. year old male who is a primary care patient of [name], MD and is actively engaged with the care management team. I reached out to [name] by phone today to assist with re-scheduling a follow up visit with the RN Case Manager  Follow up plan: Unsuccessful telephone outreach attempt made. A HIPAA compliant phone message was left for the patient providing contact information and requesting a return call.  The care management team will reach out to the patient again over the next 1 days.  If patient returns call to provider office, please advise to call [place] at [phone].  [facility] Management  Direct Dial: [phone]

## 2021-08-30 ENCOUNTER — Encounter (HOSPITAL_COMMUNITY): Payer: Medicare PPO

## 2021-08-30 ENCOUNTER — Ambulatory Visit: Payer: Medicare PPO

## 2021-08-30 ENCOUNTER — Encounter (HOSPITAL_COMMUNITY)
Admission: RE | Admit: 2021-08-30 | Discharge: 2021-08-30 | Disposition: A | Discharge status: Home / Self Care | Payer: Medicare PPO | Source: Ambulatory Visit | Attending: Internal Medicine | Admitting: Internal Medicine

## 2021-08-30 ENCOUNTER — Other Ambulatory Visit: Payer: Self-pay

## 2021-08-30 DIAGNOSIS — I2510 Atherosclerotic heart disease of native coronary artery without angina pectoris: Secondary | ICD-10-CM

## 2021-08-30 DIAGNOSIS — I2111 ST elevation (STEMI) myocardial infarction involving right coronary artery: Secondary | ICD-10-CM

## 2021-08-30 DIAGNOSIS — E782 Mixed hyperlipidemia: Secondary | ICD-10-CM

## 2021-08-30 DIAGNOSIS — R570 Cardiogenic shock: Secondary | ICD-10-CM

## 2021-08-30 DIAGNOSIS — Z955 Presence of coronary angioplasty implant and graft: Secondary | ICD-10-CM | POA: Diagnosis not present

## 2021-08-30 DIAGNOSIS — I10 Essential (primary) hypertension: Secondary | ICD-10-CM

## 2021-08-30 NOTE — Chronic Care Management (AMB) (Signed)
Chronic Care Management   CCM RN Visit Note  08/30/2021 Name: [name] MRN: [id number] DOB: 1951/07/18  Subjective: [name] is a 70 y.o. year old male who is a primary care patient of [name], MD. The care management team was consulted for assistance with disease management and care coordination needs.    Engaged with patient by telephone for follow up visit in response to provider referral for case management and/or care coordination services.   Consent to Services:  The patient was given information about Chronic Care Management services, agreed to services, and gave verbal consent prior to initiation of services.  Please see initial visit note for detailed documentation.   Patient agreed to services and verbal consent obtained.   Assessment: Review of patient past medical history, allergies, medications, health status, including review of consultants reports, laboratory and other test data, was performed as part of comprehensive evaluation and provision of chronic care management services.   SDOH (Social Determinants of Health) assessments and interventions performed:    CCM Care Plan  Allergies  Allergen Reactions   Codeine Other (See Comments)    hallucinations   Lisinopril Cough    Outpatient Encounter Medications as of 08/30/2021  Medication Sig   acetaminophen (TYLENOL) 500 MG tablet Take 1,000 mg by mouth every 6 (six) hours as needed for mild pain.   aspirin 81 MG chewable tablet Chew 1 tablet (81 mg total) by mouth daily.   BRILINTA 90 MG TABS tablet TAKE ONE TABLET BY MOUTH EVERY MORNING and TAKE ONE TABLET BY MOUTH EVERY EVENING   carvedilol (COREG) 3.125 MG tablet Take 1 tablet (3.125 mg total) by mouth 2 (two) times daily with a meal.   dapagliflozin propanediol (FARXIGA) 10 MG TABS tablet Take 1 tablet (10 mg total) by mouth daily before breakfast.   diclofenac Sodium (VOLTAREN) 1 % GEL Apply 2 g topically 2 (two) times daily as needed (pain).    finasteride ([name]) 5 MG tablet Take 1 tablet (5 mg total) by mouth daily.   ketoconazole (NIZORAL) 2 % cream Apply 1 application topically daily as needed for irritation.   LORazepam (ATIVAN) 2 MG tablet TAKE 1 TABLET 6 HOURS AS NEEDED FOR ANXIETY. (DOSE CHANGE, FILL 10/14/20 BASED ON PREV PICKUP)   nitroGLYCERIN (NITROSTAT) 0.4 MG SL tablet Place 1 tablet (0.4 mg total) under the tongue every 5 (five) minutes as needed for chest pain.   omeprazole (PRILOSEC) 20 MG capsule Take 1 capsule (20 mg total) by mouth as needed.   rosuvastatin (CRESTOR) 10 MG tablet Take 1 tablet (10 mg total) by mouth daily.   sacubitril-valsartan (ENTRESTO) 24-26 MG Take 1 tablet by mouth 2 (two) times daily.   triamcinolone cream (KENALOG) 0.1 % Apply 1 application topically 2 (two) times daily as needed (psoriasis).   [DISCONTINUED] tadalafil (CIALIS) 20 MG tablet Take 20 mg by mouth daily as needed.     No facility-administered encounter medications on file as of 08/30/2021.    Patient Active Problem List   Diagnosis Date Noted   Type 2 diabetes mellitus with diabetic chronic kidney disease ([place]) 05/30/2021   CKD (chronic kidney disease), stage III ([place]) 05/30/2021   Hypertension    Hemoptysis    Acute hypoxemic respiratory failure ([facility])    Acute renal failure ([facility])    Encephalopathy acute    Cardiogenic shock ([place]) 01/03/2021   Atypical chest pain 10/25/2019   Coronary artery calcification seen on CAT scan 06/15/2019   Nodule of lower lobe  of left lung 06/15/2019   Tremor of right hand 04/24/2017   Numbness and tingling of both legs 04/24/2017   Bilateral leg edema 06/05/2015   Chronic neck pain 06/05/2015   Plantar fasciitis 02/01/2014   Visual floaters 04/04/2013   Subjective vision disturbance, left 04/04/2013   BPH with urinary obstruction 01/01/2010   MELANOMA, HX OF 06/01/2009   NECK PAIN 09/11/2008   CELLULITIS AND ABSCESS OF LEG EXCEPT FOOT 04/26/2008   ERECTILE DYSFUNCTION 10/12/2007    Hyperlipemia, mixed 04/12/2007   Anxiety state 04/12/2007   Essential hypertension 04/12/2007   GERD 04/12/2007    Conditions to be addressed/monitored:CHF, CAD, HTN, and HLD  Care Plan : RNCM:Cardiovascular Disease Management(HF, CAD, HTN, HLD)  Updates made by Dimitri Ped, RN since 08/30/2021 12:00 AM  Completed 08/30/2021   Problem: Lack of long term management of Cardiovascular Disease Management(HF, CAD, HTN, HLD) Resolved 08/30/2021  Priority: High     Long-Range Goal: Effective self managment of Cardiovascular Disease(HF, CAD, HTN, HLD) Completed 08/30/2021  Start Date: 02/26/2021  Expected End Date: 11/20/2021  Recent Progress: On track  Priority: High  Note:   Resolving due to duplicate goal  Current Barriers:  Knowledge deficits related to basic heart failure pathophysiology and self care management, hx of CAD,HTN,HLD Unable to independently self management of Cardiovascular Disease (HF, CAD, HTN, HLD) Unable to perform IADLs independently Pt hospitalized from 01/03/21-02/07/21 with cardiogenic shock and STEMI. Pt lives alone but has a very supportive network of friends and neighbors who have been assisting him with meals, shopping and transportation.   States he had a follow up appointment at the heart failure clinic on 06/10/21. States Dr. Haroldine Laws stopped his spirolactone, hydralazine and isosorbide. States he is taking his Entresto twice a day as ordered.  States he saw Dr. Sarajane Jews for his physical and he is sending him to a nephrologist to check on his kidney function .  States he is walking daily and he has not been doing exercises while he was recovering from epididymitis.  States he does have some shortness of breath when he is doing his exercise or household chores.  Denies any chest pains.States he is weighing, checking B/P and O2 sat daily.  States his weight has been stable between 175-177.  B/P was 121/63 today.  States he is keeping a log of his readings  States he has  been following a low sodium diet and trying to eat more vegetables and fruits. States he is following a 2 L/day fluid restriction.  States he is starting cardiac rehab on 07/02/21 Nurse Case Manager Clinical Goal(s):  patient will weigh self daily and record patient will verbalize understanding of Heart Failure Action Plan and when to call doctor patient will take all Heart Failure mediations as prescribed Interventions:  Collaboration with [name], MD regarding development and update of comprehensive plan of care as evidenced by provider attestation and co-signature Inter-disciplinary care team collaboration (see longitudinal plan of care) Reviewed upcoming provider visits-Heart and vascular 08/13/21, CCM-pharmacist 12/03/21 Reviewed basic overview and discussion of pathophysiology of Heart Failure, HTN,HLD Reinforced to follow a low sodium diet Reinforced Heart Failure Action Plan Reinforced importance of daily weight Reinforced role of diuretics in prevention of fluid overload Reinforced how to use NTG tablets as needed and when to call 911 Reviewed to progress his activity as tolerated and to avoid extremes in temperature   Self-Care Activities:  Takes Heart Failure Medications as prescribed Weighs daily and record (notifying MD of  3 lb weight gain over night or 5 lb in a week) Verbalizes understanding of and follows CHF Action Plan Adheres to low sodium diet  Patient Goals:  - Take Heart Failure Medications as prescribed - Weigh daily and record (notify MD with 3 lb weight gain over night or 5 lb in a week) - Follow CHF Action Plan - Adhere to low sodium diet - begin a heart failure diary - bring diary to all appointments - eat more whole grains, fruits and vegetables, lean meats and healthy fats - follow rescue plan if symptoms flare-up - know when to call the doctor - track symptoms and what helps feel better or worse - dress right for the weather, hot or cold - follow  activity or exercise plan - check blood pressure daily - choose a place to take my blood pressure (home, clinic or office, retail store) - write blood pressure results in a log or diary   -Take B/P medications as ordered -Plan to follow a low salt diet  -Increase activity as tolerated Follow Up Plan: Telephone follow up appointment with care management team member scheduled for: 08/23/21 at 10 AM The patient has been provided with contact information for the care management team and has been advised to call with any health related questions or concerns.      Care Plan : RN Care Manager Plan of Care  Updates made by Dimitri Ped, RN since 08/30/2021 12:00 AM     Problem: Chronic Disease Management and Care Coordination Needs(HF, CAD, HTN, HLD)   Priority: High     Long-Range Goal: Establish Plan of Care for Chronic Disease Management Needs (HF, CAD, HTN, HLD)   Start Date: 08/30/2021  Expected End Date: 02/26/2022  Priority: High  Note:   Current Barriers:  Chronic Disease Management support and education needs related to CHF, CAD, HTN, and HLD    States he has his last cardiac rehab session today.  States he is feeling much stronger now.  States he plans to keep up exercising and is going to use his Silver Sneakers to go to a gym.  Denies any chest pains, swelling or shortness of breath. .States he is weighing, checking B/P and O2 sat daily.  States his weight has been stable between 176-178.  B/P was 120/66 today.  States he is keeping a log of his readings  States he has been following a low sodium diet and trying to eat more vegetables and fruits. States that the heart failure clinic has loosened his fluid restriction and he can now have more fluids.  States he is going to see the kidney doctor on 09/04/21.  RNCM Clinical Goal(s):  Patient will verbalize understanding of plan for management of CHF, CAD, HTN, and HLD as evidenced by voiced adherence to plan of care verbalize basic  understanding of  CHF, CAD, HTN, and HLD disease process and self health management plan as evidenced by voiced understanding and teach back take all medications exactly as prescribed and will call provider for medication related questions as evidenced by dispense report and pt verbalization attend all scheduled medical appointments: Dr. Sarajane Jews 09/03/21, nephrology 09/04/21  as evidenced by medical records demonstrate Ongoing adherence to prescribed treatment plan for CHF, CAD, HTN, and HLD as evidenced by readings within limits, voiced adherence to plan of care continue to work with RN Care Manager to address care management and care coordination needs related to  CHF, CAD, HTN, and HLD as evidenced by adherence  to CM Team Scheduled appointments through collaboration with RN Care manager, provider, and care team.   Interventions: 1:1 collaboration with primary care provider regarding development and update of comprehensive plan of care as evidenced by provider attestation and co-signature Inter-disciplinary care team collaboration (see longitudinal plan of care) Evaluation of current treatment plan related to  self management and patient's adherence to plan as established by provider   CAD Interventions: (Status:  Goal on track:  Yes.) Long Term Goal Assessed understanding of CAD diagnosis Medications reviewed including medications utilized in CAD treatment plan Provided education on importance of blood pressure control in management of CAD Counseled on importance of regular laboratory monitoring as prescribed Counseled on the importance of exercise goals with target of 150 minutes per week Reviewed Importance of taking all medications as prescribed Reviewed Importance of attending all scheduled provider appointments Advised to report any changes in symptoms or exercise tolerance   Heart Failure Interventions:  (Status:  Goal on track:  Yes.) Long Term Goal Provided education on low sodium  diet Reviewed Heart Failure Action Plan in depth and provided written copy Discussed importance of daily weight and advised patient to weigh and record daily  Hyperlipidemia Interventions:  (Status:  Goal on track:  Yes.) Long Term Goal Medication review performed; medication list updated in electronic medical record.  Provider established cholesterol goals reviewed Counseled on importance of regular laboratory monitoring as prescribed Reviewed role and benefits of statin for ASCVD risk reduction  Hypertension Interventions:  (Status:  Goal on track:  Yes.) Long Term Goal Last practice recorded BP readings:  BP Readings from Last 3 Encounters:  08/13/21 118/70  07/02/21 110/70  06/10/21 96/69  Most recent eGFR/CrCl: No results found for: EGFR  No components found for: CRCL  Provided education to patient re: stroke prevention, s/s of heart attack and stroke Discussed plans with patient for ongoing care management follow up and provided patient with direct contact information for care management team Advised patient, providing education and rationale, to monitor blood pressure daily and record, calling PCP for findings outside established parameters  Patient Goals/Self-Care Activities: Take all medications as prescribed Attend all scheduled provider appointments Call provider office for new concerns or questions  call office if I gain more than 2 pounds in one day or 5 pounds in one week keep legs up while sitting track weight in diary watch for swelling in feet, ankles and legs every day weigh myself daily follow rescue plan if symptoms flare-up eat more whole grains, fruits and vegetables, lean meats and healthy fats track symptoms and what helps feel better or worse dress right for the weather, hot or cold check blood pressure weekly choose a place to take my blood pressure (home, clinic or office, retail store) take blood pressure log to all doctor appointments begin an  exercise program eat more whole grains, fruits and vegetables, lean meats and healthy fats limit salt intake to 1523m/day call for medicine refill 2 or 3 days before it runs out take all medications exactly as prescribed call doctor with any symptoms you believe are related to your medicine  Follow Up Plan:  Telephone follow up appointment with care management team member scheduled for:  11/29/21 The patient has been provided with contact information for the care management team and has been advised to call with any health related questions or concerns.       Plan:Telephone follow up appointment with care management team member scheduled for:  11/29/21 The patient has  been provided with contact information for the care management team and has been advised to call with any health related questions or concerns.  Peter Garter RN, Jackquline Denmark, CDE Care Management Coordinator [name] [phone], Mobile [phone]

## 2021-08-30 NOTE — Progress Notes (Addendum)
Discharge Progress Report  Patient Details  Name: [name] MRN: [id number] Date of Birth: Aug 23, 1951 Referring Provider:   Flowsheet Row CARDIAC REHAB PHASE II ORIENTATION from 07/02/2021 in [place]  Referring Provider Glori Bickers, MD        Number of Visits: 21  Reason for Discharge:  Patient reached a stable level of exercise. Patient independent in their exercise. Patient has met program and personal goals.  Smoking History:  Social History   Tobacco Use  Smoking Status Never  Smokeless Tobacco Never    Diagnosis:  ST elevation myocardial infarction involving right coronary artery (HCC)  S/P drug eluting coronary stent placement  ADL UCSD:   Initial Exercise Prescription:  Initial Exercise Prescription - 07/02/21 1500       Date of Initial Exercise RX and Referring Provider   Date 07/02/21    Referring Provider Glori Bickers, MD    Expected Discharge Date 09/06/21      NuStep   Level 2    SPM 75    Minutes 15    METs 2      Arm Ergometer   Level 1.5    RPM 60    Minutes 15    METs 2      Prescription Details   Frequency (times per week) 3    Duration Progress to 30 minutes of continuous aerobic without signs/symptoms of physical distress      Intensity   THRR 40-80% of Max Heartrate 60-120    Ratings of Perceived Exertion 11-13    Perceived Dyspnea 0-4      Progression   Progression Continue progressive overload as per policy without signs/symptoms or physical distress.      Resistance Training   Training Prescription Yes    Weight 3 lbs    Reps 10-15             Discharge Exercise Prescription (Final Exercise Prescription Changes):  Exercise Prescription Changes - 08/30/21 1500       Response to Exercise   Blood Pressure (Admit) 102/72    Blood Pressure (Exercise) 132/60    Blood Pressure (Exit) 104/60    Heart Rate (Admit) 63 bpm    Heart Rate (Exercise) 94 bpm    Heart  Rate (Exit) 65 bpm    Rating of Perceived Exertion (Exercise) 12    Symptoms None    Comments Reviewed METs    Duration Continue with 30 min of aerobic exercise without signs/symptoms of physical distress.    Intensity THRR unchanged      Progression   Progression Continue to progress workloads to maintain intensity without signs/symptoms of physical distress.    Average METs 3.7      Resistance Training   Training Prescription Yes    Weight 4 lbs    Reps 10-15    Time 10 Minutes      Interval Training   Interval Training No      NuStep   Level 4    SPM 110    Minutes 15    METs 4.3      Track   Laps 17    Minutes 12    METs 2.97      Home Exercise Plan   Plans to continue exercise at Home (comment)    Frequency Add 2 additional days to program exercise sessions.    Initial Home Exercises Provided 07/24/21  Functional Capacity:  [place] Name 07/02/21 1433 08/26/21 1310       6 Minute Walk   Phase Initial Discharge    Distance 1600 feet 1835 feet    Distance % Change -- 14.69 %    Distance Feet Change -- 235 ft    Walk Time 6 minutes 6 minutes    # of Rest Breaks 0 0    MPH 3.03 3.48    METS 3.7 4.1    RPE 11 12    Perceived Dyspnea  0 0    VO2 Peak 12.83 14.37    Symptoms No No    Resting HR 65 bpm 66 bpm    Resting BP 110/70 100/60    Resting Oxygen Saturation  98 % --    Exercise Oxygen Saturation  during 6 min walk 97 % --    Max Ex. HR 89 bpm 91 bpm    Max Ex. BP 136/68 148/80    2 Minute Post BP 120/70 --             Psychological, QOL, Others - Outcomes: PHQ 2/9: Depression screen [facility] 2/9 09/03/2021 09/03/2021 08/30/2021 07/02/2021 05/30/2021  Decreased Interest 0 0 0 0 0  Down, Depressed, Hopeless 0 0 0 0 0  PHQ - 2 Score 0 0 0 0 0  Altered sleeping - 1 - 2 1  Tired, decreased energy - 0 - 1 0  Change in appetite - 0 - 1 0  Feeling bad or failure about yourself  - 0 - 0 0  Trouble concentrating - 0 - 0 0   Moving slowly or fidgety/restless - 1 - 1 0  Suicidal thoughts - 0 - 0 0  PHQ-9 Score - 2 - 5 1  Difficult doing work/chores - Not difficult at all - Somewhat difficult Somewhat difficult  Some recent data might be hidden    Quality of Life:  Quality of Life - 08/27/21 0756       Quality of Life Scores   Health/Function Pre 26.13 %    Health/Function Post 25.79 %    Health/Function % Change -1.3 %    Socioeconomic Pre 24.79 %    Socioeconomic Post 26.57 %    Socioeconomic % Change  7.18 %    Psych/Spiritual Pre 23.79 %    Psych/Spiritual Post 23.79 %    Psych/Spiritual % Change 0 %    Family Pre 21.33 %    Family Post 23.5 %    Family % Change 10.17 %    GLOBAL Pre 24.88 %    GLOBAL Post 25.29 %    GLOBAL % Change 1.65 %             Personal Goals: Goals established at orientation with interventions provided to work toward goal.  Personal Goals and Risk Factors at Admission - 07/02/21 1519       Core Components/Risk Factors/Patient Goals on Admission    Weight Management Weight Maintenance    Heart Failure Yes    Intervention Provide a combined exercise and nutrition program that is supplemented with education, support and counseling about heart failure. Directed toward relieving symptoms such as shortness of breath, decreased exercise tolerance, and extremity edema.    Expected Outcomes Long term: Adoption of self-care skills and reduction of barriers for early signs and symptoms recognition and intervention leading to self-care maintenance.;Short term: Daily weights obtained and reported for increase. Utilizing diuretic protocols set  by physician.;Short term: Attendance in program 2-3 days a week with increased exercise capacity. Reported lower sodium intake. Reported increased fruit and vegetable intake. Reports medication compliance.;Improve functional capacity of life    Hypertension Yes    Intervention Provide education on lifestyle modifcations including regular  physical activity/exercise, weight management, moderate sodium restriction and increased consumption of fresh fruit, vegetables, and low fat dairy, alcohol moderation, and smoking cessation.;Monitor prescription use compliance.    Expected Outcomes Short Term: Continued assessment and intervention until BP is < 140/5m HG in hypertensive participants. < 130/878mHG in hypertensive participants with diabetes, heart failure or chronic kidney disease.;Long Term: Maintenance of blood pressure at goal levels.    Lipids Yes    Intervention Provide education and support for participant on nutrition & aerobic/resistive exercise along with prescribed medications to achieve LDL <7082mHDL >102m34m  Expected Outcomes Short Term: Participant states understanding of desired cholesterol values and is compliant with medications prescribed. Participant is following exercise prescription and nutrition guidelines.;Long Term: Cholesterol controlled with medications as prescribed, with individualized exercise RX and with personalized nutrition plan. Value goals: LDL < 70mg19mL > 40 mg.              Personal Goals Discharge:  Goals and Risk Factor Review     Row Name 07/23/21 1517 08/20/21 1517 08/30/21 1354         Core Components/Risk Factors/Patient Goals Review   Personal Goals Review Weight Management/Obesity;Heart Failure;Stress;Hypertension;Lipids Weight Management/Obesity;Heart Failure;Stress;Hypertension;Lipids Weight Management/Obesity;Heart Failure;Stress;Hypertension;Lipids     Review Dalton Pickerelbeen doing well with exercise at cardiac rehab. Vital signs and weight have been stable. Dalton Pickerelinues to  do well with exercise at cardiac rehab. Vital signs  have been stable. Dalton Pickerelnjoying participating in phase 2 cardiac rehab and feels stronger. Dalton Pickerelinues to  do well with exercise at cardiac rehab. Vital signs  have been stable. Dalton Pickerelnjoying participating in phase 2 cardiac rehab and feels  stronger.Dalton Pickerelletes phase 2 cardiac rehab on 08/30/21.     Expected Outcomes [name] continue to participate in phase 2 cardiac rehab for exercise, nutrition and lifestyle modifications [name] continue to participate in phase 2 cardiac rehab for exercise, nutrition and lifestyle modifications [name] continue to  exercise, follow  nutrition and lifestyle modifications upon completion of phase 2 cardiac rehab.              Exercise Goals and Review:  Exercise Goals     Row Name 07/02/21 1523             Exercise Goals   Increase Physical Activity Yes       Intervention Provide advice, education, support and counseling about physical activity/exercise needs.;Develop an individualized exercise prescription for aerobic and resistive training based on initial evaluation findings, risk stratification, comorbidities and participant's personal goals.       Expected Outcomes Short Term: Attend rehab on a regular basis to increase amount of physical activity.;Long Term: Add in home exercise to make exercise part of routine and to increase amount of physical activity.;Long Term: Exercising regularly at least 3-5 days a week.       Increase Strength and Stamina Yes       Intervention Provide advice, education, support and counseling about physical activity/exercise needs.;Develop an individualized exercise prescription for aerobic and resistive training based on initial evaluation findings, risk stratification, comorbidities and participant's personal goals.       Expected Outcomes Short Term: Increase  workloads from initial exercise prescription for resistance, speed, and METs.;Short Term: Perform resistance training exercises routinely during rehab and add in resistance training at home;Long Term: Improve cardiorespiratory fitness, muscular endurance and strength as measured by increased METs and functional capacity (6MWT)       Able to understand and use rate of perceived exertion  (RPE) scale Yes       Intervention Provide education and explanation on how to use RPE scale       Expected Outcomes Long Term:  Able to use RPE to guide intensity level when exercising independently;Short Term: Able to use RPE daily in rehab to express subjective intensity level       Knowledge and understanding of Target Heart Rate Range (THRR) Yes       Intervention Provide education and explanation of THRR including how the numbers were predicted and where they are located for reference       Expected Outcomes Short Term: Able to state/look up THRR;Long Term: Able to use THRR to govern intensity when exercising independently;Short Term: Able to use daily as guideline for intensity in rehab       Understanding of Exercise Prescription Yes       Intervention Provide education, explanation, and written materials on patient's individual exercise prescription       Expected Outcomes Short Term: Able to explain program exercise prescription;Long Term: Able to explain home exercise prescription to exercise independently                Exercise Goals Re-Evaluation:  Exercise Goals Re-Evaluation     Row Name 07/08/21 1446 07/24/21 1500 07/31/21 1500 08/30/21 1540       Exercise Goal Re-Evaluation   Exercise Goals Review Increase Physical Activity;Increase Strength and Stamina;Able to understand and use rate of perceived exertion (RPE) scale;Knowledge and understanding of Target Heart Rate Range (THRR);Understanding of Exercise Prescription Increase Physical Activity;Increase Strength and Stamina;Able to understand and use rate of perceived exertion (RPE) scale;Knowledge and understanding of Target Heart Rate Range (THRR);Able to check pulse independently;Understanding of Exercise Prescription Increase Physical Activity;Increase Strength and Stamina;Able to understand and use rate of perceived exertion (RPE) scale;Knowledge and understanding of Target Heart Rate Range (THRR);Understanding of  Exercise Prescription Increase Physical Activity;Increase Strength and Stamina;Able to understand and use rate of perceived exertion (RPE) scale;Knowledge and understanding of Target Heart Rate Range (THRR);Able to check pulse independently    Comments Pt's first day in the CRP2 program. Pt understnads the exercise Rx, THRR, and RPE scale. Reviewed Home exercise Rx. Pt walking the dog at home. Encouraged to just walk himself for 30 minutes 2 x/week. Reviewed METs and goals. Pt does feel that his strength and stamina are improving. Pt is able to walk longer distances. Pt graduated from the [facility] program today. Pt made good progress in the program and had peak METs of 4.3. Pt has signed up for silver sneakers and is considering the [employer] or [place] [facility] to continue his exercise, Pt has progresed to his goal of increased strength and stamina and has gained more confidence in his physical abilities.    Expected Outcomes Will continue to monitor patient and progress exercise workloads as tolerated. Pt will walk at home on off days form the CRP2 program. Will continue to monitor patient and progress exercise workloads as tolerated. Pt will continue to exercise at home/gym on his own             Nutrition & Weight - Outcomes:  Pre  Biometrics - 07/02/21 1315       Pre Biometrics   Waist Circumference 38.75 inches    Hip Circumference 39 inches    Waist to Hip Ratio 0.99 %    Triceps Skinfold 11 mm    % Body Fat 22.5 %    Grip Strength 40 kg    Flexibility 0 in   unable to reach   Single Leg Stand 20.93 seconds             Post Biometrics - 08/26/21 1345        Post  Biometrics   Height 6' (1.829 m)    Weight 83.7 kg    Waist Circumference 38.5 inches    Hip Circumference 39 inches    Waist to Hip Ratio 0.99 %    BMI (Calculated) 25.02    Triceps Skinfold 11 mm    % Body Fat 24.3 %   could not reach   Grip Strength 41 kg    Flexibility 0 in    Single Leg Stand 30 seconds              Nutrition:   Nutrition Discharge:   Education Questionnaire Score:  Knowledge Questionnaire Score - 08/27/21 0757       Knowledge Questionnaire Score   Post Score 23/24             Goals reviewed with patient; copy given to patient.Pt graduated from cardiac rehab program on 08/30/21  with completion of  21 exercise sessions in Phase II. Pt maintained good attendance and progressed nicely during his participation in rehab as evidenced by increased MET level.   Medication list reconciled. Repeat  PHQ score-0  .  Pt has made significant lifestyle changes and should be commended for his success. Pt feels he has achieved his goals during cardiac rehab.   Pt plans to continue exercise by walking and stretching at home. [name] is thinking about participating in [employer]. [name] is going to look at two places. [facility] [employer] [facility] [place] [facility] at [facility]. [name] increased his distance on his post exercise walk test by 235 feet we are proud of [name]'s progress!Harrell Gave RN BSN

## 2021-08-30 NOTE — Patient Instructions (Signed)
Visit Information  Thank you for taking time to visit with me today. Please don't hesitate to contact me if I can be of assistance to you before our next scheduled telephone appointment.  Following are the goals we discussed today:  Take all medications as prescribed Attend all scheduled provider appointments Call provider office for new concerns or questions  call office if I gain more than 2 pounds in one day or 5 pounds in one week keep legs up while sitting track weight in diary watch for swelling in feet, ankles and legs every day weigh myself daily follow rescue plan if symptoms flare-up eat more whole grains, fruits and vegetables, lean meats and healthy fats track symptoms and what helps feel better or worse dress right for the weather, hot or cold check blood pressure weekly choose a place to take my blood pressure (home, clinic or office, retail store) take blood pressure log to all doctor appointments begin an exercise program eat more whole grains, fruits and vegetables, lean meats and healthy fats limit salt intake to 1500mg /day call for medicine refill 2 or 3 days before it runs out take all medications exactly as prescribed call doctor with any symptoms you believe are related to your medicine  Our next appointment is by telephone on 11/29/21 at 9:45 AM  Please call the care guide team at [phone] if you need to cancel or reschedule your appointment.   If you are experiencing a Mental Health or [place] or need someone to talk to, please call the Suicide and Crisis Lifeline: 988 call 1-800-273-TALK (toll free, 24 hour hotline) go to [facility] [address], [place] [phone]) call 911   Patient verbalizes understanding of instructions provided today and agrees to view in [place].  Peter Garter RN, Jackquline Denmark, CDE Care Management Coordinator [facility] [phone], Mobile [phone]

## 2021-09-03 ENCOUNTER — Encounter: Payer: Self-pay | Admitting: Family Medicine

## 2021-09-03 ENCOUNTER — Ambulatory Visit: Payer: Medicare PPO | Admitting: Family Medicine

## 2021-09-03 VITALS — BP 118/62 | HR 69 | Temp 98.5°F | Wt 182.0 lb

## 2021-09-03 DIAGNOSIS — I2510 Atherosclerotic heart disease of native coronary artery without angina pectoris: Secondary | ICD-10-CM | POA: Diagnosis not present

## 2021-09-03 DIAGNOSIS — N1832 Chronic kidney disease, stage 3b: Secondary | ICD-10-CM

## 2021-09-03 DIAGNOSIS — F411 Generalized anxiety disorder: Secondary | ICD-10-CM | POA: Diagnosis not present

## 2021-09-03 DIAGNOSIS — I213 ST elevation (STEMI) myocardial infarction of unspecified site: Secondary | ICD-10-CM | POA: Insufficient documentation

## 2021-09-03 DIAGNOSIS — G8929 Other chronic pain: Secondary | ICD-10-CM

## 2021-09-03 DIAGNOSIS — I10 Essential (primary) hypertension: Secondary | ICD-10-CM | POA: Diagnosis not present

## 2021-09-03 DIAGNOSIS — M542 Cervicalgia: Secondary | ICD-10-CM | POA: Diagnosis not present

## 2021-09-03 DIAGNOSIS — I25119 Atherosclerotic heart disease of native coronary artery with unspecified angina pectoris: Secondary | ICD-10-CM | POA: Insufficient documentation

## 2021-09-03 MED ORDER — METHOCARBAMOL 500 MG PO TABS: 500.0000 mg | ORAL_TABLET | Freq: Four times a day (QID) | ORAL | 5 refills | Status: DC | PRN

## 2021-09-03 MED ORDER — LORAZEPAM 2 MG PO TABS
2.0000 mg | ORAL_TABLET | Freq: Four times a day (QID) | ORAL | 5 refills | Status: DC | PRN
Start: 2021-09-03 — End: 2022-02-21

## 2021-09-03 NOTE — Progress Notes (Signed)
° °  Subjective:    Patient ID: [name], male    DOB: Mar 30, 1951, 70 y.o.   MRN: [id number]  HPI Here to follow up on several issues. He feels well in general, and his stamina improves every day. His ECHO on 06-10-21 showed the EF has increased from 25-30% to 55-60%.  He finished up cardiac rehab last week. His Cardiologist is even thinking about stopping the Brilinta sometime soon. His anxiety has been been more of an issue lately, however, and he finds that he needs to take Lorazepam 4 times a day for it to work the way it used to. This helps his anxiety and his sleep. We had referred him to Nephrology in September but he has not gotten an appt with them yet. He asks if this was still necessary. His creatinine on 08-13-21 had improved from 2.19 to 1.82. His GFR had improved a bit from 32 to 39. Finally he has had some intermittent stiffness and pain in the right neck and shoulder area for the past few weeks. No recent trauma. 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 He uses heat and Tylenol and Voltaren gel when this flares up, but these only help a little.   Review of Systems  Constitutional: Negative.   Respiratory: Negative.    Cardiovascular: Negative.   Musculoskeletal:  Positive for neck pain and neck stiffness.  Psychiatric/Behavioral:  Positive for sleep disturbance. Negative for dysphoric mood. The patient is nervous/anxious.       Objective:   Physical Exam Constitutional:      Appearance: Normal appearance. He is not ill-appearing.  Cardiovascular:     Rate and Rhythm: Normal rate and regular rhythm.     Pulses: Normal pulses.     Heart sounds: Normal heart sounds.  Pulmonary:     Effort: Pulmonary effort is normal.     Breath sounds: Normal breath sounds.  Musculoskeletal:     Right lower leg: No edema.     Left lower leg: No edema.     Comments: He is tender along the right posterior neck and over the right superior trapezius  muscle. ROM of the neck is full. The right shoulder is normal   Neurological:     Mental Status: He is alert.  Psychiatric:        Mood and Affect: Mood normal.        Behavior: Behavior normal.        Thought Content: Thought content normal.          Assessment & Plan:  He is recovering quite well from his STEMI and acute heart failure. He will follow up with Cardiology as scheduled. He has CKD stage 3, and I encouraged him to make the appt with Nephrology asap. For the anxiety, we will increase the Lorazepam 2 mg to 4 times daily as needed. He also has OA in the cervical spine that causes some muscle spasms in the neck. He will try Methocarbamol 500 mg as needed. We spent a total of ( 36  ) minutes reviewing records and discussing these issues.  [name] Penna, MD

## 2021-09-04 NOTE — Chronic Care Management (AMB) (Signed)
°  Care Management   Note  09/04/2021 Name: Raye Slyter MRN: [id number] DOB: 11-Jan-1951  Orvell Careaga is a 70 y.o. year old male who is a primary care patient of [name], MD and is actively engaged with the care management team. I reached out to [name] by phone today to assist with re-scheduling a follow up visit with the RN Case Manager  Follow up plan: Telephone appointment with care management team member scheduled for:08/30/21   [place] Management  Direct Dial: [phone]

## 2021-09-11 ENCOUNTER — Other Ambulatory Visit: Payer: Self-pay | Admitting: Family Medicine

## 2021-09-11 ENCOUNTER — Telehealth: Payer: Self-pay | Admitting: Pharmacist

## 2021-09-11 LAB — HM DIABETES EYE EXAM

## 2021-09-11 NOTE — Progress Notes (Signed)
Chronic Care Management Pharmacy Assistant   Name: [name]  MRN: [id number] DOB: 08-09-1951  Reason for Encounter: Medication Review / Medication Coordination Call   Conditions to be addressed/monitored: HTN, DMII, and GERD  Recent office visits:  09/03/2021 [name] Penna MD-  Patient was seen for anxiety and additional issues. Started Methocarbamol 500 mg every 6 hours as needed.   Recent consult visits:  None  Hospital visits:  None  Medications: Outpatient Encounter Medications as of 09/11/2021  Medication Sig   acetaminophen (TYLENOL) 500 MG tablet Take 1,000 mg by mouth every 6 (six) hours as needed for mild pain.   aspirin 81 MG chewable tablet Chew 1 tablet (81 mg total) by mouth daily.   BRILINTA 90 MG TABS tablet TAKE ONE TABLET BY MOUTH EVERY MORNING and TAKE ONE TABLET BY MOUTH EVERY EVENING   carvedilol (COREG) 3.125 MG tablet TAKE ONE TABLET BY MOUTH EVERY MORNING and TAKE ONE TABLET BY MOUTH EVERY EVENING WITH A MEAL   dapagliflozin propanediol (FARXIGA) 10 MG TABS tablet Take 1 tablet (10 mg total) by mouth daily before breakfast.   diclofenac Sodium (VOLTAREN) 1 % GEL Apply 2 g topically 2 (two) times daily as needed (pain).   finasteride ([name]) 5 MG tablet Take 1 tablet (5 mg total) by mouth daily.   ketoconazole (NIZORAL) 2 % cream Apply 1 application topically daily as needed for irritation.   LORazepam (ATIVAN) 2 MG tablet Take 1 tablet (2 mg total) by mouth every 6 (six) hours as needed for anxiety.   methocarbamol (ROBAXIN) 500 MG tablet Take 1 tablet (500 mg total) by mouth every 6 (six) hours as needed for muscle spasms.   nitroGLYCERIN (NITROSTAT) 0.4 MG SL tablet Place 1 tablet (0.4 mg total) under the tongue every 5 (five) minutes as needed for chest pain.   omeprazole (PRILOSEC) 20 MG capsule Take 1 capsule (20 mg total) by mouth as needed.   rosuvastatin (CRESTOR) 10 MG tablet Take 1 tablet (10 mg total) by mouth daily.    sacubitril-valsartan (ENTRESTO) 24-26 MG Take 1 tablet by mouth 2 (two) times daily.   triamcinolone cream (KENALOG) 0.1 % Apply 1 application topically 2 (two) times daily as needed (psoriasis).   [DISCONTINUED] tadalafil (CIALIS) 20 MG tablet Take 20 mg by mouth daily as needed.     No facility-administered encounter medications on file as of 09/11/2021.   Patient obtains medications through Adherence Packaging  30 Days    Last adherence pickup included:  Ticagrelor (BRILINTA) 90 mg: one tablet at breakfast and one tablet at dinner Farxiga 10 mg: one tablet before breakfast Rosuvastatin (CRESTOR) 10 mg: one tablet at breakfast Finasteride ([name]) 5 mg: one tablet before breakfast Carvedilol (COREG) 3.125 mg: one tablet at breakfast and one tablet at dinner Omeprazole 20 mg : take one tablet by mouth as needed. (Vial)l)   Patient declined (meds) last month:   Entresto due to plenty on hand, patient was taking it once daily, he recently started taking this BID   Patient is due for next adherence pick up on: 09/23/21 he is requesting pick up on 09/19/2021.   Called patient and reviewed medications and coordinated pickup.   This pick up to include: Ticagrelor (BRILINTA) 90 mg: one tablet at breakfast and one tablet at dinner Farxiga 10 mg: one tablet before breakfast Rosuvastatin (CRESTOR) 10 mg: one tablet at breakfast Finasteride ([name]) 5 mg: one tablet before breakfast Carvedilol (COREG) 3.125 mg: one tablet at breakfast and one  tablet at dinner   Patient will need a short fill: None   Coordinated acute fill: None   Patient declined the following medications: Omeprazole 20 mg and Entresto (this was recently filled for 90 DS), he has plenty on hand.    Confirmed pick up date of 09/19/2021  Care Gaps: Zoster Vaccine- Overdue Flu Vaccine - Overdue AWV - completed 12/28/2020 Last BP - 118/62 on 09/03/2021  Star Rating Drugs: Farxiga 10mg  last filled 08/19/2021 30DS at  [facility] Rosuvastatin 10 mg last filled 08/19/2021 at [facility] Entresto 24mg -26mg  last filled 08/05/2021 at [facility] Pharmacist Assistant [phone]

## 2021-09-25 DIAGNOSIS — N179 Acute kidney failure, unspecified: Secondary | ICD-10-CM | POA: Diagnosis not present

## 2021-09-25 DIAGNOSIS — D631 Anemia in chronic kidney disease: Secondary | ICD-10-CM | POA: Diagnosis not present

## 2021-09-25 DIAGNOSIS — N1832 Chronic kidney disease, stage 3b: Secondary | ICD-10-CM | POA: Diagnosis not present

## 2021-09-25 DIAGNOSIS — I5020 Unspecified systolic (congestive) heart failure: Secondary | ICD-10-CM | POA: Diagnosis not present

## 2021-09-25 DIAGNOSIS — Z9289 Personal history of other medical treatment: Secondary | ICD-10-CM | POA: Diagnosis not present

## 2021-09-25 DIAGNOSIS — N189 Chronic kidney disease, unspecified: Secondary | ICD-10-CM | POA: Diagnosis not present

## 2021-09-25 DIAGNOSIS — I213 ST elevation (STEMI) myocardial infarction of unspecified site: Secondary | ICD-10-CM | POA: Diagnosis not present

## 2021-09-25 DIAGNOSIS — I129 Hypertensive chronic kidney disease with stage 1 through stage 4 chronic kidney disease, or unspecified chronic kidney disease: Secondary | ICD-10-CM | POA: Diagnosis not present

## 2021-09-26 DIAGNOSIS — N453 Epididymo-orchitis: Secondary | ICD-10-CM | POA: Diagnosis not present

## 2021-09-26 DIAGNOSIS — R310 Gross hematuria: Secondary | ICD-10-CM | POA: Diagnosis not present

## 2021-09-26 DIAGNOSIS — R3914 Feeling of incomplete bladder emptying: Secondary | ICD-10-CM | POA: Diagnosis not present

## 2021-09-26 DIAGNOSIS — N401 Enlarged prostate with lower urinary tract symptoms: Secondary | ICD-10-CM | POA: Diagnosis not present

## 2021-10-01 ENCOUNTER — Other Ambulatory Visit (HOSPITAL_BASED_OUTPATIENT_CLINIC_OR_DEPARTMENT_OTHER): Payer: Self-pay

## 2021-10-10 ENCOUNTER — Telehealth: Payer: Self-pay | Admitting: Pharmacist

## 2021-10-10 NOTE — Chronic Care Management (AMB) (Signed)
Chronic Care Management Pharmacy Assistant   Name: [name]  MRN: [id number] DOB: Jan 24, 1951  Reason for Encounter: Disease State and Medication Review Hypertension Assessment and Medication Coordination Call    Conditions to be addressed/monitored: HTN  Recent office visits:  None  Recent consult visits:  None  Hospital visits:  None  Medications: Outpatient Encounter Medications as of 10/10/2021  Medication Sig   acetaminophen (TYLENOL) 500 MG tablet Take 1,000 mg by mouth every 6 (six) hours as needed for mild pain.   aspirin 81 MG chewable tablet Chew 1 tablet (81 mg total) by mouth daily.   BRILINTA 90 MG TABS tablet TAKE ONE TABLET BY MOUTH EVERY MORNING and TAKE ONE TABLET BY MOUTH EVERY EVENING   carvedilol (COREG) 3.125 MG tablet TAKE ONE TABLET BY MOUTH EVERY MORNING and TAKE ONE TABLET BY MOUTH EVERY EVENING WITH A MEAL   dapagliflozin propanediol (FARXIGA) 10 MG TABS tablet Take 1 tablet (10 mg total) by mouth daily before breakfast.   diclofenac Sodium (VOLTAREN) 1 % GEL Apply 2 g topically 2 (two) times daily as needed (pain).   finasteride ([name]) 5 MG tablet Take 1 tablet (5 mg total) by mouth daily.   ketoconazole (NIZORAL) 2 % cream Apply 1 application topically daily as needed for irritation.   LORazepam (ATIVAN) 2 MG tablet Take 1 tablet (2 mg total) by mouth every 6 (six) hours as needed for anxiety.   methocarbamol (ROBAXIN) 500 MG tablet Take 1 tablet (500 mg total) by mouth every 6 (six) hours as needed for muscle spasms.   nitroGLYCERIN (NITROSTAT) 0.4 MG SL tablet Place 1 tablet (0.4 mg total) under the tongue every 5 (five) minutes as needed for chest pain.   omeprazole (PRILOSEC) 20 MG capsule Take 1 capsule (20 mg total) by mouth as needed.   rosuvastatin (CRESTOR) 10 MG tablet Take 1 tablet (10 mg total) by mouth daily.   sacubitril-valsartan (ENTRESTO) 24-26 MG Take 1 tablet by mouth 2 (two) times daily.   triamcinolone cream (KENALOG)  0.1 % Apply 1 application topically 2 (two) times daily as needed (psoriasis).   [DISCONTINUED] tadalafil (CIALIS) 20 MG tablet Take 20 mg by mouth daily as needed.     No facility-administered encounter medications on file as of 10/10/2021.  Reviewed chart prior to disease state call. Spoke with patient regarding BP  Recent Office Vitals: BP Readings from Last 3 Encounters:  09/03/21 118/62  08/13/21 118/70  07/02/21 110/70   Pulse Readings from Last 3 Encounters:  09/03/21 69  08/13/21 (!) 55  07/02/21 65    Wt Readings from Last 3 Encounters:  09/03/21 182 lb (82.6 kg)  08/26/21 184 lb 8.4 oz (83.7 kg)  08/13/21 182 lb 9.6 oz (82.8 kg)     Kidney Function Lab Results  Component Value Date/Time   CREATININE 1.82 (H) 08/13/2021 02:54 PM   CREATININE 2.11 (H) 06/18/2021 02:47 PM   CREATININE 1.12 05/24/2020 08:29 AM   GFR 29.72 (L) 05/30/2021 08:42 AM   GFRNONAA 39 (L) 08/13/2021 02:54 PM   GFRAA 81 10/25/2019 02:55 PM    BMP Latest Ref Rng & Units 08/13/2021 06/18/2021 06/09/2021  Glucose 70 - 99 mg/dL 101(H) 112(H) 130(H)  BUN 8 - 23 mg/dL 28(H) 31(H) 37(H)  Creatinine 0.61 - 1.24 mg/dL 1.82(H) 2.11(H) 2.19(H)  BUN/Creat Ratio 6 - 22 (calc) - - -  Sodium 135 - 145 mmol/L 137 137 136  Potassium 3.5 - 5.1 mmol/L 4.4 5.2(H) 4.2  Chloride  98 - 111 mmol/L 104 105 103  CO2 22 - 32 mmol/L 26 25 22   Calcium 8.9 - 10.3 mg/dL 8.8(L) 9.0 9.0    Current antihypertensive regimen:  Carvedilol 3.125 mg - take 1 tablet twice daily Entresto 24-26 mg - take 1 tablet twice daily  How often are you checking your Blood Pressures?  Patient is checking blood pressures daily.  Current home BP readings: Patients recent readings were 123/64, 118/58 and 115/65.   What recent interventions/DTPs have been made by any provider to improve Blood Pressure control since last CPP Visit: No recent interventions.   Any recent hospitalizations or ED visits since last visit with CPP? No recent  hospital visits.  What diet changes have been made to improve Blood Pressure Control?  Patient is following a low sodium and low fat diet, he is not eating fast food.  When he goes out to eat he will have a meat vegetable and fruit and his home diet is healthy choices generally a meat, vegetable and fruit.   What exercise is being done to improve your Blood Pressure Control?  Patient is walking daily and doing weights, stretches and balance exercises daily.   Adherence Review: Is the patient currently on ACE/ARB medication? Yes Does the patient have >5 day gap between last estimated fill dates? No  Reviewed chart for medication changes ahead of medication coordination call.  No OVs, Consults, or hospital visits since last care coordination call/Pharmacist visit. (If appropriate, list visit date, provider name)  No medication changes indicated OR if recent visit, treatment plan here.  BP Readings from Last 3 Encounters:  09/03/21 118/62  08/13/21 118/70  07/02/21 110/70    Lab Results  Component Value Date   HGBA1C 6.0 05/30/2021     Patient obtains medications through Adherence Packaging  30 Days    Last adherence pickup included:  Ticagrelor (BRILINTA) 90 mg: one tablet at breakfast and one tablet at dinner Farxiga 10 mg: one tablet before breakfast Rosuvastatin (CRESTOR) 10 mg: one tablet at breakfast Finasteride ([name]) 5 mg: one tablet before breakfast Carvedilol (COREG) 3.125 mg: one tablet at breakfast and one tablet at dinner   Patient declined (meds) last month:    Omeprazole 20 mg and Entresto , he has plenty on hand.    Patient has requested next adherence delivery on: 10/21/2021   Called patient and reviewed medications and coordinated delivery.   This pick up to include: Ticagrelor (BRILINTA) 90 mg: one tablet at breakfast and one tablet at dinner Farxiga 10 mg: one tablet before breakfast Rosuvastatin (CRESTOR) 10 mg: one tablet at breakfast Finasteride  ([name]) 5 mg: one tablet before breakfast Carvedilol (COREG) 3.125 mg: one tablet at breakfast and one tablet at dinner   Patient will need a short fill: Patient declines short fills   Coordinated acute fill: Patient declines acute fills   Patient declined the following medications: Omeprazole 20 mg and Entresto, he has plenty on hand.    Confirmed delivery date change to 10/21/2021 advised patient that pharmacy will contact them the morning of delivery.  Care Gaps: AWV - completed 12/28/2020 Last BP - 118/62 on 09/03/2021 Zoster Vaccine- Overdue Flu Vaccine - Overdue  Star Rating Drugs: Farxiga 10mg  last filled 09/16/2021 30DS at [facility] Rosuvastatin 10 mg last filled 09/16/2021 at [facility] Entresto 24mg -26mg  last filled 08/05/2021 at [place] Pharmacist Assistant [phone]

## 2021-11-06 DIAGNOSIS — K5730 Diverticulosis of large intestine without perforation or abscess without bleeding: Secondary | ICD-10-CM | POA: Diagnosis not present

## 2021-11-06 DIAGNOSIS — R310 Gross hematuria: Secondary | ICD-10-CM | POA: Diagnosis not present

## 2021-11-12 ENCOUNTER — Telehealth: Payer: Self-pay | Admitting: Pharmacist

## 2021-11-12 NOTE — Chronic Care Management (AMB) (Signed)
Chronic Care Management Pharmacy Assistant   Name: [name]  MRN: [id number] DOB: August 11, 1951  Reason for Encounter: Medication Review / Medication Coordination Call   Conditions to be addressed/monitored: HTN  Recent office visits:  None  Recent consult visits:  None  Hospital visits:  None  Medications: Outpatient Encounter Medications as of 11/12/2021  Medication Sig   acetaminophen (TYLENOL) 500 MG tablet Take 1,000 mg by mouth every 6 (six) hours as needed for mild pain.   aspirin 81 MG chewable tablet Chew 1 tablet (81 mg total) by mouth daily.   BRILINTA 90 MG TABS tablet TAKE ONE TABLET BY MOUTH EVERY MORNING and TAKE ONE TABLET BY MOUTH EVERY EVENING   carvedilol (COREG) 3.125 MG tablet TAKE ONE TABLET BY MOUTH EVERY MORNING and TAKE ONE TABLET BY MOUTH EVERY EVENING WITH A MEAL   dapagliflozin propanediol (FARXIGA) 10 MG TABS tablet Take 1 tablet (10 mg total) by mouth daily before breakfast.   diclofenac Sodium (VOLTAREN) 1 % GEL Apply 2 g topically 2 (two) times daily as needed (pain).   finasteride ([name]) 5 MG tablet Take 1 tablet (5 mg total) by mouth daily.   ketoconazole (NIZORAL) 2 % cream Apply 1 application topically daily as needed for irritation.   LORazepam (ATIVAN) 2 MG tablet Take 1 tablet (2 mg total) by mouth every 6 (six) hours as needed for anxiety.   methocarbamol (ROBAXIN) 500 MG tablet Take 1 tablet (500 mg total) by mouth every 6 (six) hours as needed for muscle spasms.   nitroGLYCERIN (NITROSTAT) 0.4 MG SL tablet Place 1 tablet (0.4 mg total) under the tongue every 5 (five) minutes as needed for chest pain.   omeprazole (PRILOSEC) 20 MG capsule Take 1 capsule (20 mg total) by mouth as needed.   rosuvastatin (CRESTOR) 10 MG tablet Take 1 tablet (10 mg total) by mouth daily.   sacubitril-valsartan (ENTRESTO) 24-26 MG Take 1 tablet by mouth 2 (two) times daily.   triamcinolone cream (KENALOG) 0.1 % Apply 1 application topically 2 (two)  times daily as needed (psoriasis).   [DISCONTINUED] tadalafil (CIALIS) 20 MG tablet Take 20 mg by mouth daily as needed.     No facility-administered encounter medications on file as of 11/12/2021.   Reviewed chart for medication changes ahead of medication coordination call.  No OVs, Consults, or hospital visits since last care coordination call/Pharmacist visit. (If appropriate, list visit date, provider name)  No medication changes indicated OR if recent visit, treatment plan here.  BP Readings from Last 3 Encounters:  09/03/21 118/62  08/13/21 118/70  07/02/21 110/70    Lab Results  Component Value Date   HGBA1C 6.0 05/30/2021     Patient obtains medications through Adherence Packaging  30 Days    Last adherence pickup included:  Ticagrelor (BRILINTA) 90 mg: one tablet at breakfast and one tablet at dinner Farxiga 10 mg: one tablet before breakfast Rosuvastatin (CRESTOR) 10 mg: one tablet at breakfast Finasteride ([name]) 5 mg: one tablet before breakfast Carvedilol (COREG) 3.125 mg: one tablet at breakfast and one tablet at dinner   Patient declined (meds) last month:    Omeprazole 20 mg and Entresto , he has plenty on hand.     Patient has requested next adherence delivery on: 11/22/2021   Called patient and reviewed medications and coordinated delivery.   This delivery to include: Ticagrelor (BRILINTA) 90 mg: one tablet at breakfast and one tablet at dinner Farxiga 10 mg: one tablet before breakfast Rosuvastatin (  CRESTOR) 10 mg: one tablet at breakfast Finasteride ([name]) 5 mg: one tablet before breakfast Carvedilol (COREG) 3.125 mg: one tablet at breakfast and one tablet at dinner Omeprazole 20 mg - take one tablet as needed.    Patient will need a short fill: Patient declines short fills   Coordinated acute fill: Patient declines acute fills   Patient declined the following medications:  Entresto, he has plenty on hand, he just had this refilled a couple  days ago.     Confirmed delivery date change to 11/22/2021 advised patient that pharmacy will contact them the morning of delivery.    Care Gaps: AWV - completed 12/28/2020 Last BP - 118/62 on 09/03/2021 Last A1C - 6.0 on 05/30/2021 Zoster Vaccine - never done Foot exam - never done   Star Rating Drugs: Farxiga 10mg  last filled 10/21/2021 30DS at [facility] Rosuvastatin 10 mg last filled 10/21/2021 at [place] Pharmacist Assistant [phone]

## 2021-11-29 ENCOUNTER — Ambulatory Visit (INDEPENDENT_AMBULATORY_CARE_PROVIDER_SITE_OTHER): Payer: Medicare PPO

## 2021-11-29 DIAGNOSIS — I10 Essential (primary) hypertension: Secondary | ICD-10-CM

## 2021-11-29 DIAGNOSIS — E782 Mixed hyperlipidemia: Secondary | ICD-10-CM

## 2021-11-29 DIAGNOSIS — N1832 Chronic kidney disease, stage 3b: Secondary | ICD-10-CM

## 2021-11-29 DIAGNOSIS — I2510 Atherosclerotic heart disease of native coronary artery without angina pectoris: Secondary | ICD-10-CM

## 2021-11-29 NOTE — Chronic Care Management (AMB) (Signed)
Chronic Care Management   CCM RN Visit Note  11/29/2021 Name: [name] MRN: [id number] DOB: 12/08/1950  Subjective: [name] is a 71 y.o. year old male who is a primary care patient of [name], MD. The care management team was consulted for assistance with disease management and care coordination needs.    Engaged with patient by telephone for follow up visit in response to provider referral for case management and/or care coordination services.   Consent to Services:  The patient was given information about Chronic Care Management services, agreed to services, and gave verbal consent prior to initiation of services.  Please see initial visit note for detailed documentation.   Patient agreed to services and verbal consent obtained.   Assessment: Review of patient past medical history, allergies, medications, health status, including review of consultants reports, laboratory and other test data, was performed as part of comprehensive evaluation and provision of chronic care management services.   SDOH (Social Determinants of Health) assessments and interventions performed:    CCM Care Plan  Allergies  Allergen Reactions   Codeine Other (See Comments)    hallucinations   Lisinopril Cough    Outpatient Encounter Medications as of 11/29/2021  Medication Sig   acetaminophen (TYLENOL) 500 MG tablet Take 1,000 mg by mouth every 6 (six) hours as needed for mild pain.   aspirin 81 MG chewable tablet Chew 1 tablet (81 mg total) by mouth daily.   BRILINTA 90 MG TABS tablet TAKE ONE TABLET BY MOUTH EVERY MORNING and TAKE ONE TABLET BY MOUTH EVERY EVENING   carvedilol (COREG) 3.125 MG tablet TAKE ONE TABLET BY MOUTH EVERY MORNING and TAKE ONE TABLET BY MOUTH EVERY EVENING WITH A MEAL   dapagliflozin propanediol (FARXIGA) 10 MG TABS tablet Take 1 tablet (10 mg total) by mouth daily before breakfast.   diclofenac Sodium (VOLTAREN) 1 % GEL Apply 2 g topically 2 (two) times  daily as needed (pain).   finasteride ([name]) 5 MG tablet Take 1 tablet (5 mg total) by mouth daily.   ketoconazole (NIZORAL) 2 % cream Apply 1 application topically daily as needed for irritation.   LORazepam (ATIVAN) 2 MG tablet Take 1 tablet (2 mg total) by mouth every 6 (six) hours as needed for anxiety.   methocarbamol (ROBAXIN) 500 MG tablet Take 1 tablet (500 mg total) by mouth every 6 (six) hours as needed for muscle spasms.   nitroGLYCERIN (NITROSTAT) 0.4 MG SL tablet Place 1 tablet (0.4 mg total) under the tongue every 5 (five) minutes as needed for chest pain.   omeprazole (PRILOSEC) 20 MG capsule Take 1 capsule (20 mg total) by mouth as needed.   rosuvastatin (CRESTOR) 10 MG tablet Take 1 tablet (10 mg total) by mouth daily.   sacubitril-valsartan (ENTRESTO) 24-26 MG Take 1 tablet by mouth 2 (two) times daily.   triamcinolone cream (KENALOG) 0.1 % Apply 1 application topically 2 (two) times daily as needed (psoriasis).   [DISCONTINUED] tadalafil (CIALIS) 20 MG tablet Take 20 mg by mouth daily as needed.     No facility-administered encounter medications on file as of 11/29/2021.    Patient Active Problem List   Diagnosis Date Noted   CAD, multiple vessel 09/03/2021   ST elevation myocardial infarction (STEMI) ([place]) 09/03/2021   Type 2 diabetes mellitus with diabetic chronic kidney disease ([place]) 05/30/2021   CKD (chronic kidney disease), stage III ([place]) 05/30/2021   Hypertension    Hemoptysis    Acute hypoxemic respiratory failure ([place])  Acute renal failure ([facility])    Encephalopathy acute    Cardiogenic shock ([place]) 01/03/2021   Atypical chest pain 10/25/2019   Coronary artery calcification seen on CAT scan 06/15/2019   Nodule of lower lobe of left lung 06/15/2019   Tremor of right hand 04/24/2017   Numbness and tingling of both legs 04/24/2017   Bilateral leg edema 06/05/2015   Chronic neck pain 06/05/2015   Plantar fasciitis 02/01/2014   Visual floaters 04/04/2013    Subjective vision disturbance, left 04/04/2013   BPH with urinary obstruction 01/01/2010   MELANOMA, HX OF 06/01/2009   NECK PAIN 09/11/2008   CELLULITIS AND ABSCESS OF LEG EXCEPT FOOT 04/26/2008   ERECTILE DYSFUNCTION 10/12/2007   Hyperlipemia, mixed 04/12/2007   Anxiety state 04/12/2007   Essential hypertension 04/12/2007   GERD 04/12/2007    Conditions to be addressed/monitored:CHF, CAD, HTN, and HLD  Care Plan : RN Care Manager Plan of Care  Updates made by Dimitri Ped, RN since 11/29/2021 12:00 AM     Problem: Chronic Disease Management and Care Coordination Needs(HF, CAD, HTN, HLD)   Priority: High     Long-Range Goal: Establish Plan of Care for Chronic Disease Management Needs (HF, CAD, HTN, HLD)   Start Date: 08/30/2021  Expected End Date: 02/26/2022  Priority: High  Note:   Current Barriers:  Chronic Disease Management support and education needs related to CHF, CAD, HTN, and HLD    States he has been feeling good  States he  exercising and walking daily.  States he has not gone to any [employer] classes or gym jet but plans to do when the weather gets warmer  Denies any chest pains, swelling or shortness of breath. States he is weighing, checking B/P and O2 sat daily.  States his weight has been stable between 173-175.  B/P was 121/63 today.  States he is keeping a log of his readings  States he has been following a low sodium diet and trying to eat more vegetables and fruits. States that the heart failure clinic has loosened his fluid restriction and he can now have more fluids.  States he  saw the kidney doctor on 09/26/21 and will see him again in May.  States he is thinking of changing his primary care to a practice closer to his home but he has not decided if he is changing yet  RNCM Clinical Goal(s):  Patient will verbalize understanding of plan for management of CHF, CAD, HTN, and HLD as evidenced by voiced adherence to plan of care verbalize basic  understanding of  CHF, CAD, HTN, and HLD disease process and self health management plan as evidenced by voiced understanding and teach back take all medications exactly as prescribed and will call provider for medication related questions as evidenced by dispense report and pt verbalization attend all scheduled medical appointments: CCM Pharm D 12/03/21, Annual Wellness Visit 12/11/21, Dr. Haroldine Laws 12/31/21,  nephrology May 2023  as evidenced by medical records demonstrate Ongoing adherence to prescribed treatment plan for CHF, CAD, HTN, and HLD as evidenced by readings within limits, voiced adherence to plan of care continue to work with RN Care Manager to address care management and care coordination needs related to  CHF, CAD, HTN, and HLD as evidenced by adherence to CM Team Scheduled appointments through collaboration with RN Care manager, provider, and care team.   Interventions: 1:1 collaboration with primary care provider regarding development and update of comprehensive plan of care as evidenced by provider  attestation and co-signature Inter-disciplinary care team collaboration (see longitudinal plan of care) Evaluation of current treatment plan related to  self management and patient's adherence to plan as established by provider Discussed with pt to let RNCM know if he decided to change his provider to [facility] and that he will be transferred to the [place] at [facility]   CAD Interventions: (Status:  Goal on track:  Yes.) Long Term Goal Assessed understanding of CAD diagnosis Medications reviewed including medications utilized in CAD treatment plan Provided education on importance of blood pressure control in management of CAD Counseled on importance of regular laboratory monitoring as prescribed Counseled on the importance of exercise goals with target of 150 minutes per week Reviewed Importance of taking all medications as prescribed Reviewed Importance of attending all  scheduled provider appointments Advised to report any changes in symptoms or exercise tolerance Reviewed to continue his exercise as tolerated and encouraged to go to [employer] in the future   Heart Failure Interventions:  (Status:  Goal on track:  Yes.) Long Term Goal Provided education on low sodium diet Reviewed Heart Failure Action Plan in depth and provided written copy Discussed importance of daily weight and advised patient to weigh and record daily Discussed the importance of keeping all appointments with provider Provided patient with education about the role of exercise in the management of heart failure  Hyperlipidemia Interventions:  (Status:  Goal on track:  Yes.) Long Term Goal Medication review performed; medication list updated in electronic medical record.  Provider established cholesterol goals reviewed Counseled on importance of regular laboratory monitoring as prescribed Reviewed role and benefits of statin for ASCVD risk reduction Reviewed importance of limiting foods high in cholesterol Reviewed exercise goals and target of 150 minutes per week  Hypertension Interventions:  (Status:  Goal on track:  Yes.) Long Term Goal Last practice recorded BP readings:  BP Readings from Last 3 Encounters:  09/03/21 118/62  08/13/21 118/70  07/02/21 110/70  Most recent eGFR/CrCl: No results found for: EGFR  No components found for: CRCL  Evaluation of current treatment plan related to hypertension self management and patient's adherence to plan as established by provider Provided education to patient re: stroke prevention, s/s of heart attack and stroke Counseled on the importance of exercise goals with target of 150 minutes per week Discussed plans with patient for ongoing care management follow up and provided patient with direct contact information for care management team Advised patient, providing education and rationale, to monitor blood pressure daily and record,  calling PCP for findings outside established parameters Provided education on prescribed diet low sodium heart healthy  Patient Goals/Self-Care Activities: Take all medications as prescribed Attend all scheduled provider appointments Call provider office for new concerns or questions  call office if I gain more than 2 pounds in one day or 5 pounds in one week keep legs up while sitting track weight in diary watch for swelling in feet, ankles and legs every day weigh myself daily follow rescue plan if symptoms flare-up eat more whole grains, fruits and vegetables, lean meats and healthy fats track symptoms and what helps feel better or worse dress right for the weather, hot or cold check blood pressure daily choose a place to take my blood pressure (home, clinic or office, retail store) take blood pressure log to all doctor appointments begin an exercise program eat more whole grains, fruits and vegetables, lean meats and healthy fats limit salt intake to 1543m/day call for  medicine refill 2 or 3 days before it runs out take all medications exactly as prescribed call doctor with any symptoms you believe are related to your medicine  Follow Up Plan:  Telephone follow up appointment with care management team member scheduled for:  02/21/22 The patient has been provided with contact information for the care management team and has been advised to call with any health related questions or concerns.       Plan:Telephone follow up appointment with care management team member scheduled for:  02/21/22 The patient has been provided with contact information for the care management team and has been advised to call with any health related questions or concerns.  Peter Garter RN, Jackquline Denmark, CDE Care Management Coordinator [facility] [phone]

## 2021-11-29 NOTE — Patient Instructions (Signed)
Visit Information ? ?Thank you for taking time to visit with me today. Please don't hesitate to contact me if I can be of assistance to you before our next scheduled telephone appointment. ? ?Following are the goals we discussed today:  ?Take all medications as prescribed ?Attend all scheduled provider appointments ?Call provider office for new concerns or questions  ?call office if I gain more than 2 pounds in one day or 5 pounds in one week ?keep legs up while sitting ?track weight in diary ?watch for swelling in feet, ankles and legs every day ?weigh myself daily ?follow rescue plan if symptoms flare-up ?eat more whole grains, fruits and vegetables, lean meats and healthy fats ?track symptoms and what helps feel better or worse ?dress right for the weather, hot or cold ?check blood pressure daily ?choose a place to take my blood pressure (home, clinic or office, retail store) ?take blood pressure log to all doctor appointments ?begin an exercise program ?eat more whole grains, fruits and vegetables, lean meats and healthy fats ?limit salt intake to '1500mg'$ /day ?call for medicine refill 2 or 3 days before it runs out ?take all medications exactly as prescribed ?call doctor with any symptoms you believe are related to your medicine ? ?Our next appointment is by telephone on 02/21/22 at 10 AM ? ?Please call the care guide team at [phone] if you need to cancel or reschedule your appointment.  ? ?If you are experiencing a Mental Health or [place] or need someone to talk to, please call the Suicide and Crisis Lifeline: 988 ?call the [place] National Suicide Prevention Lifeline: 760 226 7144 or TTY: (947)282-8886 TTY (385)844-3316) to talk to a trained counselor ?call 1-800-273-TALK (toll free, 24 hour hotline) ?go to [facility] [address], [place] [phone]) ?call 911  ? ?Patient verbalizes understanding of instructions and care plan provided today and  agrees to view in [place]. Active MyChart status confirmed with patient.   ? ?Peter Garter RN, BSN,CCM, CDE ?Care Management Coordinator ?[facility] ?[phone]   ?

## 2021-12-02 ENCOUNTER — Telehealth: Payer: Self-pay | Admitting: Pharmacist

## 2021-12-02 NOTE — Chronic Care Management (AMB) (Signed)
? ? ?  Chronic Care Management ?Pharmacy Assistant  ? ?Name: [name]  MRN: [id number] DOB: [birth year] ? ?12/03/2021 APPOINTMENT REMINDER ? ?[name] was reminded to have all medications, supplements and any blood glucose and blood pressure readings available for review with Jeni Salles, Pharm. D, at his telephone visit on 12/03/2021 at 1:00. ? ? ?Questions: ?Have you had any recent office visit or specialist visit outside of [place]? [facility] in January and Urology in January. Renal CT in February. ? ?Are there any concerns you would like to discuss during your office visit? Patient spoke with Lenna Sciara at [facility] (Case Manager) this past Friday, she is going to discontinue with [facility] and changing back to [employer]. Patient states he doesn't like being stuck at home waiting and there was an issue with him getting his medications on time.  ?He mentions he has made up his and doesn't want to be persuaded to go back to [facility]. ? ?Are you having any problems obtaining your medications? (Whether it pharmacy issues or cost) See message above. ? ?If patient has any PAP medications ask if they are having any problems getting their PAP medication or refill? Patient is not taking any pap medications.  ? ?Care Gaps: ?AWV - scheduled 12/09/2021 ?Last BP - 118/62 on 09/03/2021 ?Last A1C - 6.0 on 05/30/2021 ?Zoster Vaccine - never done ?Foot exam - never done ?  ?Star Rating Drugs: ?Farxiga '10mg'$  last filled 11/18/2021 30DS at [facility] ?Rosuvastatin 10 mg last filled 11/18/2021 at [facility] ? ? ?Any gaps in medications fill history? NO ? ?Gennie Alma CMA  ?Clinical Pharmacist Assistant ?[phone] ? ?

## 2021-12-03 ENCOUNTER — Ambulatory Visit: Payer: Medicare PPO | Admitting: Pharmacist

## 2021-12-03 DIAGNOSIS — E782 Mixed hyperlipidemia: Secondary | ICD-10-CM

## 2021-12-03 DIAGNOSIS — I10 Essential (primary) hypertension: Secondary | ICD-10-CM

## 2021-12-03 NOTE — Progress Notes (Signed)
? ?Chronic Care Management ?Pharmacy Note ? ?12/03/2021 ?Name:  [name] MRN:  [id number] DOB:  Mar 21, 1951 ? ?Summary: ?BP is at goal < 130/80 per home and office BP readings ?Pt reports sleep is improved but anxiety has worsened ? ?Recommendations/Changes made from today's visit: ?-Recommended decreasing melatonin timed release to 5 mg ?-Recommended continued routine BP and weight monitoring at home ?-Recommended determining coverage for psychiatrists with insurance plan and to discuss with new PCP ? ?Plan: ?-Follow up with Virgel Manifold CPP ? ? ?Subjective: ?[name] is an 71 y.o. year old male who is a primary patient of [name], MD.  The CCM team was consulted for assistance with disease management and care coordination needs.   ? ?Engaged with patient by telephone for follow up visit in response to provider referral for pharmacy case management and/or care coordination services.  ? ?Consent to Services:  ?The patient was given information about Chronic Care Management services, agreed to services, and gave verbal consent prior to initiation of services.  Please see initial visit note for detailed documentation.  ? ?Patient Care Team: ?[name], MD as PCP - General ?Tat, Eustace Quail, DO as Consulting Physician (Neurology) ?Dimitri Ped, RN as Case Manager ?Viona Gilmore, [facility] as Pharmacist (Pharmacist) ? ?Recent office visits: ?09/03/2021 [name] Penna MD-  Patient was seen for anxiety and additional issues. Started Methocarbamol 500 mg every 6 hours as needed.  ? ?Recent consult visits: ?08/13/21 Allena Katz, [place] (cardiology): Patient presented for CHF follow up. Follow up in 4-5 months. ? ?06/10/2021 Bensimhon, Shaune Pascal, MD (Cardiology) - Patient was seen for Stage 3 chronic kidney disease and additional issues. No medication changes. Follow up in 2 months. ? ?Hospital visits: ?Patient was seen at [facility] on 06/09/2021 for   1 hour. Patient was seen for Orchitis and epididymitis. Started on Hydrocodone-Acetaminophen 5/325 mg and Levofloxacin 500 mg. No follow up noted.None ? ?Objective: ? ?Lab Results  ?Component Value Date  ? CREATININE 1.82 (H) 08/13/2021  ? BUN 28 (H) 08/13/2021  ? GFR 29.72 (L) 05/30/2021  ? GFRNONAA 39 (L) 08/13/2021  ? GFRAA 81 10/25/2019  ? NA 137 08/13/2021  ? K 4.4 08/13/2021  ? CALCIUM 8.8 (L) 08/13/2021  ? CO2 26 08/13/2021  ? GLUCOSE 101 (H) 08/13/2021  ? ? ?Lab Results  ?Component Value Date/Time  ? HGBA1C 6.0 05/30/2021 08:42 AM  ? HGBA1C 5.7 (H) 01/06/2021 04:08 AM  ? GFR 29.72 (L) 05/30/2021 08:42 AM  ? GFR 64.40 05/17/2019 09:05 AM  ?  ?Last diabetic Eye exam: No results found for: HMDIABEYEEXA  ?Last diabetic Foot exam: No results found for: HMDIABFOOTEX  ? ?Lab Results  ?Component Value Date  ? CHOL 119 05/30/2021  ? HDL 45.10 05/30/2021  ? [place] 56 05/30/2021  ? LDLDIRECT 141.9 04/06/2013  ? TRIG 90.0 05/30/2021  ? CHOLHDL 3 05/30/2021  ? ? ?Hepatic Function Latest Ref Rng & Units 05/30/2021 02/03/2021 01/31/2021  ?Total Protein 6.0 - 8.3 g/dL 6.8 6.0(L) 5.7(L)  ?Albumin 3.5 - 5.2 g/dL 4.2 2.6(L) 2.2(L)  ?AST 0 - 37 U/L _0 ?ALT 0 - 53 U/L 19 45(H) 83(H)  ?Alk Phosphatase 39 - 117 U/L 38(L) 75 81  ?Total Bilirubin 0.2 - 1.2 mg/dL 0.5 0.9 0.4  ?Bilirubin, Direct 0.0 - 0.3 mg/dL 0.1 - -  ? ? ?Lab Results  ?Component Value Date/Time  ? TSH 1.99 05/30/2021 08:42 AM  ? TSH 1.63  05/24/2020 08:29 AM  ? FREET4 0.89 12/16/2016 02:44 PM  ? ? ?CBC Latest Ref Rng & Units 06/18/2021 06/09/2021 05/30/2021  ?WBC 4.0 - 10.5 K/uL 6.4 20.5(H) 6.7  ?Hemoglobin 13.0 - 17.0 g/dL 12.1(L) 13.3 12.7(L)  ?Hematocrit 39.0 - 52.0 % 37.6(L) 38.1(L) 37.7(L)  ?Platelets 150 - 400 K/uL 238 160 150.0  ? ? ?No results found for: VD25OH ? ?Clinical ASCVD: Yes  ?The ASCVD Risk score (Arnett DK, et al., 2019) failed to calculate for the following reasons: ?  The patient has a prior MI or stroke diagnosis   ? ?Depression screen [facility] 2/9  09/03/2021 09/03/2021 08/30/2021  ?Decreased Interest 0 0 0  ?Down, Depressed, Hopeless 0 0 0  ?PHQ - 2 Score 0 0 0  ?Altered sleeping - 1 -  ?Tired, decreased energy - 0 -  ?Change in appetite - 0 -  ?Feeling bad or failure about yourself  - 0 -  ?Trouble concentrating - 0 -  ?Moving slowly or fidgety/restless - 1 -  ?Suicidal thoughts - 0 -  ?PHQ-9 Score - 2 -  ?Difficult doing work/chores - Not difficult at all -  ?Some recent data might be hidden  ?  ? ?Social History  ? ?Tobacco Use  ?Smoking Status Never  ?Smokeless Tobacco Never  ? ?BP Readings from Last 3 Encounters:  ?09/03/21 118/62  ?08/13/21 118/70  ?07/02/21 110/70  ? ?Pulse Readings from Last 3 Encounters:  ?09/03/21 69  ?08/13/21 (!) 55  ?07/02/21 65  ? ?Wt Readings from Last 3 Encounters:  ?09/03/21 182 lb (82.6 kg)  ?08/26/21 184 lb 8.4 oz (83.7 kg)  ?08/13/21 182 lb 9.6 oz (82.8 kg)  ? ?BMI Readings from Last 3 Encounters:  ?09/03/21 24.68 kg/m?  ?08/26/21 25.03 kg/m?  ?08/13/21 24.77 kg/m?  ? ? ?Assessment/Interventions: Review of patient past medical history, allergies, medications, health status, including review of consultants reports, laboratory and other test data, was performed as part of comprehensive evaluation and provision of chronic care management services.  ? ?SDOH:  (Social Determinants of Health) assessments and interventions performed: Yes ? ? ?SDOH Screenings  ? ?Alcohol Screen: Low Risk   ? Last Alcohol Screening Score (AUDIT): 0  ?Depression (PHQ2-9): Low Risk   ? PHQ-2 Score: 0  ?Financial Resource Strain: Low Risk   ? Difficulty of Paying Living Expenses: Not hard at all  ?Food Insecurity: No Food Insecurity  ? Worried About Charity fundraiser in the Last Year: Never true  ? Ran Out of Food in the Last Year: Never true  ?Housing: Low Risk   ? Last Housing Risk Score: 0  ?Physical Activity: Sufficiently Active  ? Days of Exercise per Week: 7 days  ? Minutes of Exercise per Session: 30 min  ?Social Connections: Socially Isolated   ? Frequency of Communication with Friends and Family: More than three times a week  ? Frequency of Social Gatherings with Friends and Family: More than three times a week  ? Attends Religious Services: Never  ? Active Member of Clubs or Organizations: No  ? Attends Archivist Meetings: Never  ? Marital Status: Never married  ?Stress: No Stress Concern Present  ? Feeling of Stress : Not at all  ?Tobacco Use: Low Risk   ? Smoking Tobacco Use: Never  ? Smokeless Tobacco Use: Never  ? Passive Exposure: Not on file  ?Transportation Needs: No Transportation Needs  ? Lack of Transportation (Medical): No  ? Lack of Transportation (Non-Medical): No  ? ? ?  CCM Care Plan ? ?Allergies  ?Allergen Reactions  ? Codeine Other (See Comments)  ?  hallucinations  ? Lisinopril Cough  ? ? ?Medications Reviewed Today   ? ? Reviewed by Viona Gilmore, [place] (Pharmacist) on 12/03/21 at 1330  Med List Status: <None>  ? ?Medication Order Taking? Sig Documenting Provider Last Dose Status Informant  ?acetaminophen (TYLENOL) 500 MG tablet [id number]  Take 1,000 mg by mouth every 6 (six) hours as needed for mild pain. [provider]  Active Self  ?aspirin 81 MG chewable tablet [id number]  Chew 1 tablet (81 mg total) by mouth daily. [name], MD  Active Self  ?BRILINTA 90 MG TABS tablet [id number]  TAKE ONE TABLET BY MOUTH EVERY MORNING and TAKE ONE TABLET BY MOUTH EVERY EVENING [name], MD  Active   ?carvedilol (COREG) 3.125 MG tablet [id number]  TAKE ONE TABLET BY MOUTH EVERY MORNING and TAKE ONE TABLET BY MOUTH EVERY EVENING WITH A MEAL [name], MD  Active   ?Cholecalciferol (VITAMIN D) 50 MCG (2000 UT) CAPS [id number] Yes Take 1 capsule by mouth daily. [provider] Taking Active   ?dapagliflozin propanediol (FARXIGA) 10 MG TABS tablet [id number]  Take 1 tablet (10 mg total) by mouth daily before breakfast. Rafael Bihari, FNP  Active Self  ?diclofenac Sodium (VOLTAREN) 1 % GEL [id number]   Apply 2 g topically 2 (two) times daily as needed (pain). [provider]  Active Self  ?finasteride ([name]) 5 MG tablet [id number]  Take 1 tablet (5 mg total) by mouth daily. [name],

## 2021-12-03 NOTE — Patient Instructions (Addendum)
Mertha Baars, ? ?It was great to catch up again! It was great getting to know you - you will be in good hands with Mendel Ryder but let me know if you need anything! ? ?Best, ?Maddie ? ?Jeni Salles, PharmD, BCACP ?Clinical Pharmacist ?Therapist, music at [facility] ?[phone] ? ? Visit Information ? ? Goals Addressed   ?None ?  ? ?Patient Care Plan: RNCM:Cardiovascular Disease Management(HF, CAD, HTN, HLD)  ?Completed 08/30/2021  ? ?Problem Identified: Lack of long term management of Cardiovascular Disease Management(HF, CAD, HTN, HLD) Resolved 08/30/2021  ?Priority: High  ?  ? ?Long-Range Goal: Effective self managment of Cardiovascular Disease(HF, CAD, HTN, HLD) Completed 08/30/2021  ?Start Date: 02/26/2021  ?Expected End Date: 11/20/2021  ?Recent Progress: On track  ?Priority: High  ?Note:   ?Resolving due to duplicate goal  ?Current Barriers:  ?Knowledge deficits related to basic heart failure pathophysiology and self care management, hx of CAD,HTN,HLD ?Unable to independently self management of Cardiovascular Disease (HF, CAD, HTN, HLD) ?Unable to perform IADLs independently ?Pt hospitalized from 01/03/21-02/07/21 with cardiogenic shock and STEMI. Pt lives alone but has a very supportive network of friends and neighbors who have been assisting him with meals, shopping and transportation. ?  States he had a follow up appointment at the heart failure clinic on 06/10/21. States Dr. Haroldine Laws stopped his spirolactone, hydralazine and isosorbide. States he is taking his Entresto twice a day as ordered.  States he saw Dr. Sarajane Jews for his physical and he is sending him to a nephrologist to check on his kidney function .  States he is walking daily and he has not been doing exercises while he was recovering from epididymitis.  States he does have some shortness of breath when he is doing his exercise or household chores.  Denies any chest pains.States he is weighing, checking B/P and O2 sat daily.  States his weight has been stable  between 175-177.  B/P was 121/63 today.  States he is keeping a log of his readings  States he has been following a low sodium diet and trying to eat more vegetables and fruits. States he is following a 2 L/day fluid restriction.  States he is starting cardiac rehab on 07/02/21 ?Nurse Case Manager Clinical Goal(s):  ?patient will weigh self daily and record ?patient will verbalize understanding of Heart Failure Action Plan and when to call doctor ?patient will take all Heart Failure mediations as prescribed ?Interventions:  ?Collaboration with [name], MD regarding development and update of comprehensive plan of care as evidenced by provider attestation and co-signature ?Inter-disciplinary care team collaboration (see longitudinal plan of care) ?Reviewed upcoming provider visits-Heart and vascular 08/13/21, CCM-pharmacist 12/03/21 ?Reviewed basic overview and discussion of pathophysiology of Heart Failure, HTN,HLD ?Reinforced to follow a low sodium diet ?Reinforced Heart Failure Action Plan ?Reinforced importance of daily weight ?Reinforced role of diuretics in prevention of fluid overload ?Reinforced how to use NTG tablets as needed and when to call 911 ?Reviewed to progress his activity as tolerated and to avoid extremes in temperature   ?Self-Care Activities:  ?Takes Heart Failure Medications as prescribed ?Weighs daily and record (notifying MD of 3 lb weight gain over night or 5 lb in a week) ?Verbalizes understanding of and follows CHF Action Plan ?Adheres to low sodium diet  ?Patient Goals:  ?- Take Heart Failure Medications as prescribed ?- Weigh daily and record (notify MD with 3 lb weight gain over night or 5 lb in a week) ?- Follow CHF Action Plan ?-  Adhere to low sodium diet ?- begin a heart failure diary ?- bring diary to all appointments ?- eat more whole grains, fruits and vegetables, lean meats and healthy fats ?- follow rescue plan if symptoms flare-up ?- know when to call the doctor ?- track  symptoms and what helps feel better or worse ?- dress right for the weather, hot or cold ?- follow activity or exercise plan ?- check blood pressure daily ?- choose a place to take my blood pressure (home, clinic or office, retail store) ?- write blood pressure results in a log or diary  ? -Take B/P medications as ordered ?-Plan to follow a low salt diet  ?-Increase activity as tolerated ?Follow Up Plan: Telephone follow up appointment with care management team member scheduled for: 08/23/21 at 10 AM ?The patient has been provided with contact information for the care management team and has been advised to call with any health related questions or concerns.   ?  ? ?Patient Care Plan: [place]  ?  ? ?Problem Identified: Problem: Hypertension, Hyperlipidemia, Heart Failure, Coronary Artery Disease, GERD, Depression, Anxiety, and BPH   ?  ? ?Long-Range Goal: Patient-Specific Goal   ?Start Date: 03/07/2021  ?Expected End Date: 03/07/2022  ?Recent Progress: On track  ?Priority: High  ?Note:   ?Current Barriers:  ?Unable to independently monitor therapeutic efficacy ? ?Pharmacist Clinical Goal(s):  ?Patient will achieve adherence to monitoring guidelines and medication adherence to achieve therapeutic efficacy through collaboration with PharmD and provider.  ? ?Interventions: ?1:1 collaboration with [name], MD regarding development and update of comprehensive plan of care as evidenced by provider attestation and co-signature ?Inter-disciplinary care team collaboration (see longitudinal plan of care) ?Comprehensive medication review performed; medication list updated in electronic medical record ? ?Hypertension (BP goal <130/80) ?-Controlled ?-Current treatment: ?Carvedilol 3.125 mg 1 tablet twice daily - Appropriate, Effective, Safe, Accessible ?Entresto 24-26 mg 1 tablet twice daily - Appropriate, Effective, Safe, Accessible ?-Medications previously tried: hydralazine (hypotension), losartan (replaced  by [name]), spironolactone (hypotension) ?-Current home readings: 117/60, 124/61 (highest since Feb), 103/57 (this happened some in cardiac rehab) ?-Current dietary habits: strictly limiting salt intake  ?-Current exercise habits: did not discuss ?-Denies hypotensive/hypertensive symptoms ?-Educated on BP goals and benefits of medications for prevention of heart attack, stroke and kidney damage; ?Exercise goal of 150 minutes per week; ?Importance of home blood pressure monitoring; ?Proper BP monitoring technique; ?-Counseled to monitor BP at home weekly, document, and provide log at future appointments ?-Counseled on diet and exercise extensively ?Recommended to continue current medication ? ?Hyperlipidemia: (LDL goal < 70) ?-Controlled ?-Current treatment: ?Rosuvastatin 10 mg 1 tablet daily - Appropriate, Effective, Safe, Accessible ?-Medications previously tried: none  ?-Current dietary patterns: not eating fried foods ?-Current exercise habits: PT exercises ?-Educated on Cholesterol goals;  ?Benefits of statin for ASCVD risk reduction; ?Exercise goal of 150 minutes per week; ?-Counseled on diet and exercise extensively ?Recommended to continue current medication ? ?Heart Failure (Goal: manage symptoms and prevent exacerbations) ?-Controlled ?-Last ejection fraction: 25-30% (Date: 4/22) ?-HF type: Systolic ?-NYHA Class: II (slight limitation of activity) ?-AHA HF Stage: B (Heart disease present - no symptoms present) ?-Current treatment: ?Farxiga 10 mg 1 tablet before breakfast - Appropriate, Effective, Safe, Accessible ?Furosemide 40 mg 1 tablet as needed - Appropriate, Effective, Safe, Accessible ?Carvedilol 3.125 mg 1 tablet twice daily - Appropriate, Effective, Safe, Accessible ?Entresto 24-26 mg 1 tablet twice daily - Appropriate, Effective, Safe, Accessible ?-Medications previously tried: spironolactone, hydralazine ?-Current  home BP/HR readings: refer to above ?-Current dietary habits: previously used a  salt diary and not going over 1500 mg per day since starting ?-Current exercise habits: PT exercises every day; walking dog every morning ?-Educated on Benefits of medications for managing symptoms an

## 2021-12-09 ENCOUNTER — Ambulatory Visit (INDEPENDENT_AMBULATORY_CARE_PROVIDER_SITE_OTHER): Payer: Medicare PPO

## 2021-12-09 VITALS — BP 122/65 | HR 57 | Ht 73.0 in | Wt 172.8 lb

## 2021-12-09 DIAGNOSIS — Z0000 Encounter for general adult medical examination without abnormal findings: Secondary | ICD-10-CM

## 2021-12-09 NOTE — Progress Notes (Signed)
?I connected with  [name] today via telehealth video enabled device and verified that I am speaking with the correct person using two identifiers. ?  ?Location: ?Patient: home ?Provider: work ? ?Persons participating in virtual visit: Kinsler, Soeder LPN ? ?I discussed the limitations, risks, security and privacy concerns of performing an evaluation and management service by video and the availability of in person appointments. The patient expressed understanding and agreed to proceed. ?  ?Some vital signs may be absent or patient reported.  ?  ? ?Subjective:  ? [name] is a 71 y.o. male who presents for Medicare Annual/Subsequent preventive examination. ? ?Review of Systems    ? ?Cardiac Risk Factors include: advanced age (>65mn, >>67women);diabetes mellitus;dyslipidemia;hypertension;male gender ? ?   ?Objective:  ?  ?Today's Vitals  ? 12/09/21 1114 12/09/21 1116  ?BP: 122/65   ?Pulse: (!) 57   ?SpO2: 99%   ?Weight: 172 lb 12.8 oz (78.4 kg)   ?Height: '6\' 1"'$  (1.854 m)   ?PainSc:  2   ? ?Body mass index is 22.8 kg/m?. ? ?Advanced Directives 12/09/2021 06/09/2021 02/26/2021 01/05/2021 12/28/2020 02/07/2019 05/03/2018  ?Does Patient Have a Medical Advance Directive? Yes No Yes Yes Yes Yes Yes  ?Type of Advance Directive Out of facility DNR (pink MOST or yellow form);[place] - [place] Living will - Living will;Healthcare Power of Attorney -  ?Does patient want to make changes to medical advance directive? - - No - Patient declined No - Patient declined No - Patient declined - -  ?Copy of [place] Chart? Yes - validated most recent copy scanned in chart (See row information) - Yes - validated most recent copy scanned in chart (See row information) - - - -  ? ? ?Current Medications (verified) ?Outpatient Encounter Medications as of 12/09/2021  ?Medication Sig  ? acetaminophen (TYLENOL) 500 MG tablet Take 1,000  mg by mouth every 6 (six) hours as needed for mild pain.  ? aspirin 81 MG chewable tablet Chew 1 tablet (81 mg total) by mouth daily.  ? BRILINTA 90 MG TABS tablet TAKE ONE TABLET BY MOUTH EVERY MORNING and TAKE ONE TABLET BY MOUTH EVERY EVENING  ? carvedilol (COREG) 3.125 MG tablet TAKE ONE TABLET BY MOUTH EVERY MORNING and TAKE ONE TABLET BY MOUTH EVERY EVENING WITH A MEAL  ? Cholecalciferol (VITAMIN D) 50 MCG (2000 UT) CAPS Take 1 capsule by mouth daily.  ? dapagliflozin propanediol (FARXIGA) 10 MG TABS tablet Take 1 tablet (10 mg total) by mouth daily before breakfast.  ? diclofenac Sodium (VOLTAREN) 1 % GEL Apply 2 g topically 2 (two) times daily as needed (pain).  ? finasteride ([name]) 5 MG tablet Take 1 tablet (5 mg total) by mouth daily.  ? ketoconazole (NIZORAL) 2 % cream Apply 1 application topically daily as needed for irritation.  ? LORazepam (ATIVAN) 2 MG tablet Take 1 tablet (2 mg total) by mouth every 6 (six) hours as needed for anxiety.  ? Melatonin 10 MG TABS Take 1 tablet by mouth daily.  ? methocarbamol (ROBAXIN) 500 MG tablet Take 1 tablet (500 mg total) by mouth every 6 (six) hours as needed for muscle spasms.  ? nitroGLYCERIN (NITROSTAT) 0.4 MG SL tablet Place 1 tablet (0.4 mg total) under the tongue every 5 (five) minutes as needed for chest pain.  ? omeprazole (PRILOSEC) 20 MG capsule Take 1 capsule (20 mg total) by mouth as needed.  ?  rosuvastatin (CRESTOR) 10 MG tablet Take 1 tablet (10 mg total) by mouth daily.  ? sacubitril-valsartan (ENTRESTO) 24-26 MG Take 1 tablet by mouth 2 (two) times daily.  ? sildenafil (REVATIO) 20 MG tablet Take 20 mg by mouth as needed.  ? triamcinolone cream (KENALOG) 0.1 % Apply 1 application topically 2 (two) times daily as needed (psoriasis).  ? [DISCONTINUED] tadalafil (CIALIS) 20 MG tablet Take 20 mg by mouth daily as needed.    ? ?No facility-administered encounter medications on file as of 12/09/2021.  ? ? ?Allergies (verified) ?Codeine and Lisinopril   ? ?History: ?Past Medical History:  ?Diagnosis Date  ? Anxiety   ? Arthritis   ? Cancer [facility]) 2010  ? basal cell ca, head  ? Cataract   ? bilateral  ? Depression   ? ED (erectile dysfunction)   ? GERD (gastroesophageal reflux disease)   ? Hyperlipidemia   ? Hypertension   ? Kidney stone 07-29-11  ? passed   ? Neuromuscular disorder ([place])   ? neuropathy legs  ? Vitreous floater   ? left eye, sees Dr. Dawna [facility]   ? ?Past Surgical History:  ?Procedure Laterality Date  ? BASAL CELL CARCINOMA EXCISION    ? removed from scalp  ? COLONOSCOPY  07/03/2017  ? per Dr. Havery Moros, sessile serrated polyp, repeat in 5 yrs   ? HERNIA REPAIR    ? LEFT HEART CATH AND CORONARY ANGIOGRAPHY N/A 01/03/2021  ? Procedure: LEFT HEART CATH AND CORONARY ANGIOGRAPHY;  Surgeon: Troy Sine, MD;  Location: [place] [facility];  Service: Cardiovascular;  Laterality: N/A;  ? RIGHT HEART CATH N/A 01/03/2021  ? Procedure: RIGHT HEART CATH;  Surgeon: Jolaine Artist, MD;  Location: [place] [facility];  Service: Cardiovascular;  Laterality: N/A;  ? ?Family History  ?Problem Relation Age of Onset  ? Hypertension Mother   ? Hyperlipidemia Mother   ? Depression Other   ? Diabetes Other   ? Hyperlipidemia Other   ? Hypertension Other   ? Alzheimer's disease Other   ? Colon cancer Neg Hx   ? ?Social History  ? ?Socioeconomic History  ? Marital status: Single  ?  Spouse name: Not on file  ? Number of children: Not on file  ? Years of education: Not on file  ? Highest education level: Not on file  ?Occupational History  ? Occupation: retired  ?  Comment: teacher  ?Tobacco Use  ? Smoking status: Never  ? Smokeless tobacco: Never  ?Vaping Use  ? Vaping Use: Never used  ?Substance and Sexual Activity  ? Alcohol use: No  ?  Alcohol/week: 0.0 standard drinks  ? Drug use: No  ? Sexual activity: Not on file  ?Other Topics Concern  ? Not on file  ?Social History Narrative  ? Not on file  ? ?Social Determinants of Health  ? ?Financial  Resource Strain: Low Risk   ? Difficulty of Paying Living Expenses: Not hard at all  ?Food Insecurity: No Food Insecurity  ? Worried About Charity fundraiser in the Last Year: Never true  ? Ran Out of Food in the Last Year: Never true  ?Transportation Needs: No Transportation Needs  ? Lack of Transportation (Medical): No  ? Lack of Transportation (Non-Medical): No  ?Physical Activity: Sufficiently Active  ? Days of Exercise per Week: 7 days  ? Minutes of Exercise per Session: 90 min  ?Stress: Stress Concern Present  ? Feeling of Stress :  To some extent  ?Social Connections: Socially Isolated  ? Frequency of Communication with Friends and Family: More than three times a week  ? Frequency of Social Gatherings with Friends and Family: More than three times a week  ? Attends Religious Services: Never  ? Active Member of Clubs or Organizations: No  ? Attends Archivist Meetings: Never  ? Marital Status: Never married  ? ? ?Tobacco Counseling ?Counseling given: Not Answered ? ? ?Clinical Intake: ? ?Pre-visit preparation completed: Yes ? ?Pain : 0-10 ?Pain Score: 2  ?Pain Type: Chronic pain ?Pain Location: Neck ?Pain Orientation: Right ?Pain Radiating Towards: shoulder ?Pain Descriptors / Indicators: Aching ?Pain Onset: More than a month ago ?Pain Frequency: Constant ?Pain Relieving Factors: muscle relaxer and voltaren gel ? ?Pain Relieving Factors: muscle relaxer and voltaren gel ? ?Nutritional Status: BMI of 19-24  Normal ?Nutritional Risks: None ?Diabetes: Yes ? ?How often do you need to have someone help you when you read instructions, pamphlets, or other written materials from your doctor or pharmacy?: 1 - Never ?What is the last grade level you completed in school?: beyond masters ? ?Diabetic? Yes ?Nutrition Risk Assessment: ? ?Has the patient had any N/V/D within the last 2 months?  No  ?Does the patient have any non-healing wounds?  No  ?Has the patient had any unintentional weight loss or weight gain?   No  ? ?Diabetes: ? ?Is the patient diabetic?  Yes  ?If diabetic, was a CBG obtained today?  No  ?Did the patient bring in their glucometer from home?  No  ?How often do you monitor your CBG's? Does not.  ? ?

## 2021-12-09 NOTE — Patient Instructions (Signed)
[name] , ?Thank you for taking time to come for your Medicare Wellness Visit. I appreciate your ongoing commitment to your health goals. Please review the following plan we discussed and let me know if I can assist you in the future.  ? ?Screening recommendations/referrals: ?Colonoscopy: completed 07/03/2017, due 07/03/2022 ?Recommended yearly ophthalmology/optometry visit for glaucoma screening and checkup ?Recommended yearly dental visit for hygiene and checkup ? ?Vaccinations: ?Influenza vaccine: completed 05/30/2021, due next flu season ?Pneumococcal vaccine: completed 04/23/2016 ?Tdap vaccine: completed 09/12/2013, due 09/13/2023 ?Shingles vaccine: discussed   ?Covid-19:  08/08/2021, 05/10/2021, 07/07/2020, 11/26/2019, 10/31/2019 ? ?Advanced directives: copy in chart ? ?Conditions/risks identified: none ? ?Next appointment: Follow up in one year for your annual wellness visit.  ? ?Preventive Care 4 Years and Older, Male ?Preventive care refers to lifestyle choices and visits with your health care provider that can promote health and wellness. ?What does preventive care include? ?A yearly physical exam. This is also called an annual well check. ?Dental exams once or twice a year. ?Routine eye exams. Ask your health care provider how often you should have your eyes checked. ?Personal lifestyle choices, including: ?Daily care of your teeth and gums. ?Regular physical activity. ?Eating a healthy diet. ?Avoiding tobacco and drug use. ?Limiting alcohol use. ?Practicing safe sex. ?Taking low doses of aspirin every day. ?Taking vitamin and mineral supplements as recommended by your health care provider. ?What happens during an annual well check? ?The services and screenings done by your health care provider during your annual well check will depend on your age, overall health, lifestyle risk factors, and family history of disease. ?Counseling  ?Your health care provider may ask you questions about your: ?Alcohol  use. ?Tobacco use. ?Drug use. ?Emotional well-being. ?Home and relationship well-being. ?Sexual activity. ?Eating habits. ?History of falls. ?Memory and ability to understand (cognition). ?Work and work Statistician. ?Screening  ?You may have the following tests or measurements: ?Height, weight, and BMI. ?Blood pressure. ?Lipid and cholesterol levels. These may be checked every 5 years, or more frequently if you are over 67 years old. ?Skin check. ?Lung cancer screening. You may have this screening every year starting at age 42 if you have a 30-pack-year history of smoking and currently smoke or have quit within the past 15 years. ?Fecal occult blood test (FOBT) of the stool. You may have this test every year starting at age 71. ?Flexible sigmoidoscopy or colonoscopy. You may have a sigmoidoscopy every 5 years or a colonoscopy every 10 years starting at age 82. ?Prostate cancer screening. Recommendations will vary depending on your family history and other risks. ?Hepatitis C blood test. ?Hepatitis B blood test. ?Sexually transmitted disease (STD) testing. ?Diabetes screening. This is done by checking your blood sugar (glucose) after you have not eaten for a while (fasting). You may have this done every 1-3 years. ?Abdominal aortic aneurysm (AAA) screening. You may need this if you are a current or former smoker. ?Osteoporosis. You may be screened starting at age 49 if you are at high risk. ?Talk with your health care provider about your test results, treatment options, and if necessary, the need for more tests. ?Vaccines  ?Your health care provider may recommend certain vaccines, such as: ?Influenza vaccine. This is recommended every year. ?Tetanus, diphtheria, and acellular pertussis (Tdap, Td) vaccine. You may need a Td booster every 10 years. ?Zoster vaccine. You may need this after age 74. ?Pneumococcal 13-valent conjugate (PCV13) vaccine. One dose is recommended after age 11. ?Pneumococcal polysaccharide  (PPSV23)  vaccine. One dose is recommended after age 76. ?Talk to your health care provider about which screenings and vaccines you need and how often you need them. ?This information is not intended to replace advice given to you by your health care provider. Make sure you discuss any questions you have with your health care provider. ?Document Released: 10/05/2015 Document Revised: 05/28/2016 Document Reviewed: 07/10/2015 ?Elsevier Interactive Patient Education ? 2017 [employer]. ? ?Fall Prevention in the Home ?Falls can cause injuries. They can happen to people of all ages. There are many things you can do to make your home safe and to help prevent falls. ?What can I do on the outside of my home? ?Regularly fix the edges of walkways and driveways and fix any cracks. ?Remove anything that might make you trip as you walk through a door, such as a raised step or threshold. ?Trim any bushes or trees on the path to your home. ?Use bright outdoor lighting. ?Clear any walking paths of anything that might make someone trip, such as rocks or tools. ?Regularly check to see if handrails are loose or broken. Make sure that both sides of any steps have handrails. ?Any raised decks and porches should have guardrails on the edges. ?Have any leaves, snow, or ice cleared regularly. ?Use sand or salt on walking paths during winter. ?Clean up any spills in your garage right away. This includes oil or grease spills. ?What can I do in the bathroom? ?Use night lights. ?Install grab bars by the toilet and in the tub and shower. Do not use towel bars as grab bars. ?Use non-skid mats or decals in the tub or shower. ?If you need to sit down in the shower, use a plastic, non-slip stool. ?Keep the floor dry. Clean up any water that spills on the floor as soon as it happens. ?Remove soap buildup in the tub or shower regularly. ?Attach bath mats securely with double-sided non-slip rug tape. ?Do not have throw rugs and other things on the  floor that can make you trip. ?What can I do in the bedroom? ?Use night lights. ?Make sure that you have a light by your bed that is easy to reach. ?Do not use any sheets or blankets that are too big for your bed. They should not hang down onto the floor. ?Have a firm chair that has side arms. You can use this for support while you get dressed. ?Do not have throw rugs and other things on the floor that can make you trip. ?What can I do in the kitchen? ?Clean up any spills right away. ?Avoid walking on wet floors. ?Keep items that you use a lot in easy-to-reach places. ?If you need to reach something above you, use a strong step stool that has a grab bar. ?Keep electrical cords out of the way. ?Do not use floor polish or wax that makes floors slippery. If you must use wax, use non-skid floor wax. ?Do not have throw rugs and other things on the floor that can make you trip. ?What can I do with my stairs? ?Do not leave any items on the stairs. ?Make sure that there are handrails on both sides of the stairs and use them. Fix handrails that are broken or loose. Make sure that handrails are as long as the stairways. ?Check any carpeting to make sure that it is firmly attached to the stairs. Fix any carpet that is loose or worn. ?Avoid having throw rugs at the top or bottom of  the stairs. If you do have throw rugs, attach them to the floor with carpet tape. ?Make sure that you have a light switch at the top of the stairs and the bottom of the stairs. If you do not have them, ask someone to add them for you. ?What else can I do to help prevent falls? ?Wear shoes that: ?Do not have high heels. ?Have rubber bottoms. ?Are comfortable and fit you well. ?Are closed at the toe. Do not wear sandals. ?If you use a stepladder: ?Make sure that it is fully opened. Do not climb a closed stepladder. ?Make sure that both sides of the stepladder are locked into place. ?Ask someone to hold it for you, if possible. ?Clearly mark and make  sure that you can see: ?Any grab bars or handrails. ?First and last steps. ?Where the edge of each step is. ?Use tools that help you move around (mobility aids) if they are needed. These include: ?Canes. ?Walkers. ?Scoot

## 2021-12-10 ENCOUNTER — Telehealth: Payer: Self-pay | Admitting: Pharmacist

## 2021-12-10 NOTE — Chronic Care Management (AMB) (Signed)
? ? ?Chronic Care Management ?Pharmacy Assistant  ? ?Name: [name]  MRN: [id number] DOB: 12-24-1950 ? ?Reason for Encounter: Medication Review / Medication Coordination Call ?  ?Conditions to be addressed/monitored: ?HTN ? ?Recent office visits:  ?12/09/2021 Glenna Durand LPN - Medicare annual wellness exam ? ?Recent consult visits:  ?None ? ?Hospital visits:  ?None ? ?Medications: ?Outpatient Encounter Medications as of 12/10/2021  ?Medication Sig  ? acetaminophen (TYLENOL) 500 MG tablet Take 1,000 mg by mouth every 6 (six) hours as needed for mild pain.  ? aspirin 81 MG chewable tablet Chew 1 tablet (81 mg total) by mouth daily.  ? BRILINTA 90 MG TABS tablet TAKE ONE TABLET BY MOUTH EVERY MORNING and TAKE ONE TABLET BY MOUTH EVERY EVENING  ? carvedilol (COREG) 3.125 MG tablet TAKE ONE TABLET BY MOUTH EVERY MORNING and TAKE ONE TABLET BY MOUTH EVERY EVENING WITH A MEAL  ? Cholecalciferol (VITAMIN D) 50 MCG (2000 UT) CAPS Take 1 capsule by mouth daily.  ? dapagliflozin propanediol (FARXIGA) 10 MG TABS tablet Take 1 tablet (10 mg total) by mouth daily before breakfast.  ? diclofenac Sodium (VOLTAREN) 1 % GEL Apply 2 g topically 2 (two) times daily as needed (pain).  ? finasteride ([name]) 5 MG tablet Take 1 tablet (5 mg total) by mouth daily.  ? ketoconazole (NIZORAL) 2 % cream Apply 1 application topically daily as needed for irritation.  ? LORazepam (ATIVAN) 2 MG tablet Take 1 tablet (2 mg total) by mouth every 6 (six) hours as needed for anxiety.  ? Melatonin 10 MG TABS Take 1 tablet by mouth daily.  ? methocarbamol (ROBAXIN) 500 MG tablet Take 1 tablet (500 mg total) by mouth every 6 (six) hours as needed for muscle spasms.  ? nitroGLYCERIN (NITROSTAT) 0.4 MG SL tablet Place 1 tablet (0.4 mg total) under the tongue every 5 (five) minutes as needed for chest pain.  ? omeprazole (PRILOSEC) 20 MG capsule Take 1 capsule (20 mg total) by mouth as needed.  ? rosuvastatin (CRESTOR) 10 MG tablet Take 1 tablet (10  mg total) by mouth daily.  ? sacubitril-valsartan (ENTRESTO) 24-26 MG Take 1 tablet by mouth 2 (two) times daily.  ? sildenafil (REVATIO) 20 MG tablet Take 20 mg by mouth as needed.  ? triamcinolone cream (KENALOG) 0.1 % Apply 1 application topically 2 (two) times daily as needed (psoriasis).  ? [DISCONTINUED] tadalafil (CIALIS) 20 MG tablet Take 20 mg by mouth daily as needed.    ? ?No facility-administered encounter medications on file as of 12/10/2021.  ?Reviewed chart for medication changes ahead of medication coordination call. ? ?No OVs, Consults, or hospital visits since last care coordination call/Pharmacist visit. (If appropriate, list visit date, provider name) ? ?No medication changes indicated OR if recent visit, treatment plan here. ? ?BP Readings from Last 3 Encounters:  ?12/09/21 122/65  ?09/03/21 118/62  ?08/13/21 118/70  ?  ?Lab Results  ?Component Value Date  ? HGBA1C 6.0 05/30/2021  ?  ? ?Patient obtains medications through Adherence Packaging  30 Days  ?  ?Last adherence pickup included:  ?Ticagrelor (BRILINTA) 90 mg: one tablet at breakfast and one tablet at dinner ?Farxiga 10 mg: one tablet before breakfast ?Rosuvastatin (CRESTOR) 10 mg: one tablet at breakfast ?Finasteride ([name]) 5 mg: one tablet before breakfast ?Carvedilol (COREG) 3.125 mg: one tablet at breakfast and one tablet at dinner ?Omeprazole 20 mg - take one tablet as needed.  ?  ?Patient declined (meds) last month:  ?  Entresto,  he has plenty on hand, he just had this refilled a couple days ago.    ?  ?  ?Patient has requested next adherence delivery on: 12/20/2021 ?  ?Called patient and reviewed medications and coordinated delivery. ?  ?This delivery to include: ?Ticagrelor (BRILINTA) 90 mg: one tablet at breakfast and one tablet at dinner ?Farxiga 10 mg: one tablet before breakfast ?Rosuvastatin (CRESTOR) 10 mg: one tablet at breakfast ?Finasteride ([name]) 5 mg: one tablet before breakfast ?Carvedilol (COREG) 3.125 mg: one  tablet at breakfast and one tablet at dinner ?  ?Patient will need a short fill: Patient declines short fills ?  ?Coordinated acute fill: Patient declines acute fills ?  ?Patient declined the following medications:   ?Omeprazole patient has plenty on hand  ?Entresto patient has plenty on hand ?  ?Confirmed delivery date of 12/20/2021 advised patient that pharmacy will contact them the morning of delivery. ?  ?  ?Care Gaps: ?AWV - completed 12/09/2021 ?Last BP - 122/65 on 12/09/2021 ?Last A1C - 6.0 on 05/30/2021 ?Zoster Vaccine - never done ?Foot exam - never done ?  ?Star Rating Drugs: ?Farxiga '10mg'$  last filled 11/18/2021 30DS at [facility] ?Rosuvastatin 10 mg last filled 11/18/2021 at [facility] ? ?Gennie Alma CMA  ?Clinical Pharmacist Assistant ?[phone] ? ?

## 2021-12-20 ENCOUNTER — Encounter: Payer: Medicare PPO | Admitting: Nurse Practitioner

## 2021-12-20 ENCOUNTER — Ambulatory Visit: Payer: Medicare PPO | Admitting: Family Medicine

## 2021-12-20 ENCOUNTER — Other Ambulatory Visit (HOSPITAL_BASED_OUTPATIENT_CLINIC_OR_DEPARTMENT_OTHER): Payer: Self-pay

## 2021-12-20 ENCOUNTER — Encounter: Payer: Self-pay | Admitting: Family Medicine

## 2021-12-20 VITALS — BP 110/62 | HR 56 | Temp 98.7°F | Wt 172.5 lb

## 2021-12-20 DIAGNOSIS — F411 Generalized anxiety disorder: Secondary | ICD-10-CM

## 2021-12-20 DIAGNOSIS — I2510 Atherosclerotic heart disease of native coronary artery without angina pectoris: Secondary | ICD-10-CM | POA: Diagnosis not present

## 2021-12-20 DIAGNOSIS — I5020 Unspecified systolic (congestive) heart failure: Secondary | ICD-10-CM

## 2021-12-20 DIAGNOSIS — I110 Hypertensive heart disease with heart failure: Secondary | ICD-10-CM | POA: Diagnosis not present

## 2021-12-20 DIAGNOSIS — F32A Depression, unspecified: Secondary | ICD-10-CM

## 2021-12-20 DIAGNOSIS — N400 Enlarged prostate without lower urinary tract symptoms: Secondary | ICD-10-CM | POA: Diagnosis not present

## 2021-12-20 DIAGNOSIS — E785 Hyperlipidemia, unspecified: Secondary | ICD-10-CM | POA: Diagnosis not present

## 2021-12-20 MED ORDER — DULOXETINE HCL 20 MG PO CPEP: 20.0000 mg | ORAL_CAPSULE | Freq: Every day | ORAL | 3 refills | Status: DC

## 2021-12-20 MED ORDER — ZOSTER VAC RECOMB ADJUVANTED 50 MCG/0.5ML IM SUSR
INTRAMUSCULAR | 1 refills | Status: DC
  Filled 2021-12-20: qty 0.5, 1d supply, fill #0
  Filled 2022-04-11 (×3): qty 0.5, 1d supply, fill #1

## 2021-12-20 NOTE — Progress Notes (Signed)
? ?  Subjective:  ? ? Patient ID: [name], male    DOB: 02-06-1951, 71 y.o.   MRN: [id number] ? ?HPI ?Here to discuss his anxiety. He has been taking Lorazepam as needed and this still works well, but he thinks he should get back on a maintenance medication like Cymbalta. He had taken 30 mg of this a day for a few years, and then we agreed to stop it. Now he wants to try a lower dose of this. He sleeps well. He walks and does balance exercises daily. ? ? ?Review of Systems  ?Constitutional: Negative.   ?Respiratory: Negative.    ?Cardiovascular: Negative.   ?Neurological: Negative.   ?Psychiatric/Behavioral:  Negative for dysphoric mood. The patient is nervous/anxious.   ? ?   ?Objective:  ? Physical Exam ?Constitutional:   ?   Appearance: Normal appearance.  ?Cardiovascular:  ?   Rate and Rhythm: Normal rate and regular rhythm.  ?   Pulses: Normal pulses.  ?   Heart sounds: Normal heart sounds.  ?Pulmonary:  ?   Effort: Pulmonary effort is normal.  ?   Breath sounds: Normal breath sounds.  ?Neurological:  ?   General: No focal deficit present.  ?   Mental Status: He is alert. Mental status is at baseline.  ?Psychiatric:     ?   Mood and Affect: Mood normal.     ?   Behavior: Behavior normal.     ?   Thought Content: Thought content normal.  ? ? ? ? ? ?   ?Assessment & Plan:  ?For the anxiety, he will try Cymbalta 20 mg daily. Report back in 4 weeks.  ?[name] Penna, MD ? ? ?

## 2021-12-26 ENCOUNTER — Other Ambulatory Visit (HOSPITAL_BASED_OUTPATIENT_CLINIC_OR_DEPARTMENT_OTHER): Payer: Self-pay

## 2021-12-31 ENCOUNTER — Ambulatory Visit (HOSPITAL_COMMUNITY)
Admission: RE | Admit: 2021-12-31 | Discharge: 2021-12-31 | Disposition: A | Discharge status: Home / Self Care | Payer: Medicare PPO | Source: Ambulatory Visit | Attending: Internal Medicine | Admitting: Internal Medicine

## 2021-12-31 ENCOUNTER — Encounter (HOSPITAL_COMMUNITY): Payer: Self-pay | Admitting: Internal Medicine

## 2021-12-31 ENCOUNTER — Other Ambulatory Visit (HOSPITAL_BASED_OUTPATIENT_CLINIC_OR_DEPARTMENT_OTHER): Payer: Self-pay

## 2021-12-31 VITALS — BP 112/68 | HR 67 | Wt 181.2 lb

## 2021-12-31 DIAGNOSIS — I5022 Chronic systolic (congestive) heart failure: Secondary | ICD-10-CM | POA: Diagnosis not present

## 2021-12-31 DIAGNOSIS — I130 Hypertensive heart and chronic kidney disease with heart failure and stage 1 through stage 4 chronic kidney disease, or unspecified chronic kidney disease: Secondary | ICD-10-CM | POA: Insufficient documentation

## 2021-12-31 DIAGNOSIS — E875 Hyperkalemia: Secondary | ICD-10-CM | POA: Diagnosis not present

## 2021-12-31 DIAGNOSIS — Z955 Presence of coronary angioplasty implant and graft: Secondary | ICD-10-CM | POA: Insufficient documentation

## 2021-12-31 DIAGNOSIS — N1832 Chronic kidney disease, stage 3b: Secondary | ICD-10-CM | POA: Insufficient documentation

## 2021-12-31 DIAGNOSIS — E785 Hyperlipidemia, unspecified: Secondary | ICD-10-CM | POA: Insufficient documentation

## 2021-12-31 DIAGNOSIS — Z66 Do not resuscitate: Secondary | ICD-10-CM | POA: Diagnosis not present

## 2021-12-31 DIAGNOSIS — Z7984 Long term (current) use of oral hypoglycemic drugs: Secondary | ICD-10-CM | POA: Insufficient documentation

## 2021-12-31 DIAGNOSIS — Z79899 Other long term (current) drug therapy: Secondary | ICD-10-CM | POA: Insufficient documentation

## 2021-12-31 DIAGNOSIS — G931 Anoxic brain damage, not elsewhere classified: Secondary | ICD-10-CM | POA: Insufficient documentation

## 2021-12-31 DIAGNOSIS — I252 Old myocardial infarction: Secondary | ICD-10-CM | POA: Diagnosis not present

## 2021-12-31 DIAGNOSIS — Z7982 Long term (current) use of aspirin: Secondary | ICD-10-CM | POA: Diagnosis not present

## 2021-12-31 DIAGNOSIS — I2510 Atherosclerotic heart disease of native coronary artery without angina pectoris: Secondary | ICD-10-CM | POA: Insufficient documentation

## 2021-12-31 DIAGNOSIS — Z8674 Personal history of sudden cardiac arrest: Secondary | ICD-10-CM | POA: Diagnosis not present

## 2021-12-31 LAB — BASIC METABOLIC PANEL
Anion gap: 7 (ref 5–15)
BUN: 31 mg/dL — ABNORMAL HIGH (ref 8–23)
CO2: 23 mmol/L (ref 22–32)
Calcium: 9 mg/dL (ref 8.9–10.3)
Chloride: 105 mmol/L (ref 98–111)
Creatinine, Ser: 1.57 mg/dL — ABNORMAL HIGH (ref 0.61–1.24)
GFR, Estimated: 47 mL/min — ABNORMAL LOW (ref >60)
Glucose, Bld: 97 mg/dL (ref 70–99)
Potassium: 4.2 mmol/L (ref 3.5–5.1)
Sodium: 135 mmol/L (ref 135–145)

## 2021-12-31 NOTE — Addendum Note (Signed)
Encounter addended by: Scarlette Calico, RN on: 12/31/2021 12:13 PM ? Actions taken: Order list changed, Diagnosis association updated, Clinical Note Signed, Charge Capture section accepted

## 2021-12-31 NOTE — Patient Instructions (Signed)
Medication Changes: ? ?None ? ?Lab Work: ? ?Labs done today, your results will be available in MyChart, we will contact you for abnormal readings. ? ?Testing/Procedures: ?ech ?Your physician has requested that you have an echocardiogram. Echocardiography is a painless test that uses sound waves to create images of your heart. It provides your doctor with information about the size and shape of your heart and how well your heart?s chambers and valves are working. This procedure takes approximately one hour. There are no restrictions for this procedure. IN 6 MONTHS ? ?Referrals: ? ?None ? ?Special Instructions // Education: ? ?Do the following things EVERYDAY: ?Weigh yourself in the morning before breakfast. Write it down and keep it in a log. ?Take your medicines as prescribed ?Eat low salt foods--Limit salt (sodium) to 2000 mg per day.  ?Stay as active as you can everyday ?Limit all fluids for the day to less than 2 liters ? ? ?Follow-Up in: 6 months with an echocardiogram, **PLEASE CALL OUR OFFICE IN AUGUST TO SCHEDULE THESE APPOINTMENT ? ?At the [facility], you and your health needs are our priority. We have a designated team specialized in the treatment of Heart Failure. This Care Team includes your primary Heart Failure Specialized Cardiologist (physician), Advanced Practice Providers (APPs- Physician Assistants and Nurse Practitioners), and Pharmacist who all work together to provide you with the care you need, when you need it.  ? ?You may see any of the following providers on your designated Care Team at your next follow up: ? ?Dr Glori Bickers ?Dr Loralie Champagne ?Darrick Grinder, NP ?Lyda Jester, PA ?Jessica Milford,NP ?Marlyce Huge, PA ?Audry Riles, PharmD ? ? ?Please be sure to bring in all your medications bottles to every appointment.  ? ?Need to Contact us: ? ?If you have any questions or concerns before your next appointment please send [place] a message through [place] or call our  office at [phone].   ? ?TO LEAVE A MESSAGE FOR THE NURSE SELECT OPTION 2, PLEASE LEAVE A MESSAGE INCLUDING: ?YOUR NAME ?DATE OF BIRTH ?CALL BACK NUMBER ?REASON FOR CALL**this is important as we prioritize the call backs ? ?YOU WILL RECEIVE A CALL BACK THE SAME DAY AS LONG AS YOU CALL BEFORE 4:00 PM ? ? ?

## 2021-12-31 NOTE — Progress Notes (Addendum)
?  ? Advanced Heart Failure Clinic Note ? ? ?PCP: Dr. [name] Penna ?HF Cardiologist: Dr. Haroldine Laws ? ?HPI: ? ?[name] is a 71 year old male with past medical history of hypertension, hyperlipidemia, VF arrest, CAD, and systolic heart failure. ? ?He was admitted 4/22 with STEMI and cardiac arrest prior to coming to hospital.  He underwent emergent cardiac catheterization and PCI with stent to proximal RCA. EF 25 to 30%.  Patient did have prolonged ICU stay for cardiogenic/hemorrhagic shock requiring vasopressors, Impella and transfusion.  He suffered from diminished mental status and MRI showed scattered punctate infarction concerning for anoxic injury.  He had AKI requiring CRRT, then transitioned to [facility].  Palliative care was consulted and made DNR.  He was discharged to home on oxygen with Home Health, discharge weight 175.5 lbs. ? ?Arlyce Harman, Imdur and hydralazine stopped 9/22 with issues with orthostasis and hyperkalemia. Had acute epididmyitis/orchitis with WBC 21k, followed up with Urology and resolved. ? ?Echo 9/22 EF 55-60% RV mildly HK. ? ?Follow up 11/22, doing well with CR. Stable NYHA II, euvolemic. ? ?Today he returns for HF follow up. Overall feeling fine. No SOB with activity. Restarted anti-anxiety medication. Noticed HR in 40's last month after taking Robaxin, has since decreased his dose and no further issues. Denies palpitations, CP, dizziness, edema, or PND/Orthopnea. Appetite ok. No fever or chills. Weight at home 175 pounds. Taking all medications.  ? ? ?Cardiac Studies: ?- Echo (9/22): EF 55-60%, RV mildly HK ?- Echo (4/22): EF 25-30%, severe LV dysfunction, RV moderately reduced  ?- LHC (4/22): Ost LAD to Prox LAD lesion is 50% stenosed. ?Mid LAD lesion is 20% stenosed. ?Prox RCA-1 lesion is 100% stenosed, s/p PCI+DES ?Prox RCA-2 lesion is 50% stenosed. ? ?- RHC (4/22): On NE and Impella ?RA = 4 ?RV = 19/3 ?PA = 20/10 (11) ?PCW = 7 ?Fick cardiac output/index = 2.6/1.7 ?FA sat = 100% ?PA  sat = 54% ? ?ROS: All systems negative except as listed in HPI, PMH and Problem List. ? ?SH:  ?Social History  ? ?Socioeconomic History  ? Marital status: Single  ?  Spouse name: Not on file  ? Number of children: Not on file  ? Years of education: Not on file  ? Highest education level: Not on file  ?Occupational History  ? Occupation: retired  ?  Comment: teacher  ?Tobacco Use  ? Smoking status: Never  ? Smokeless tobacco: Never  ?Vaping Use  ? Vaping Use: Never used  ?Substance and Sexual Activity  ? Alcohol use: No  ?  Alcohol/week: 0.0 standard drinks  ? Drug use: No  ? Sexual activity: Not on file  ?Other Topics Concern  ? Not on file  ?Social History Narrative  ? Not on file  ? ?Social Determinants of Health  ? ?Financial Resource Strain: Low Risk   ? Difficulty of Paying Living Expenses: Not hard at all  ?Food Insecurity: No Food Insecurity  ? Worried About Charity fundraiser in the Last Year: Never true  ? Ran Out of Food in the Last Year: Never true  ?Transportation Needs: No Transportation Needs  ? Lack of Transportation (Medical): No  ? Lack of Transportation (Non-Medical): No  ?Physical Activity: Sufficiently Active  ? Days of Exercise per Week: 7 days  ? Minutes of Exercise per Session: 90 min  ?Stress: Stress Concern Present  ? Feeling of Stress : To some extent  ?Social Connections: Not on file  ?Intimate Partner Violence: Not on file  ? ?  FH:  ?Family History  ?Problem Relation Age of Onset  ? Hypertension Mother   ? Hyperlipidemia Mother   ? Depression Other   ? Diabetes Other   ? Hyperlipidemia Other   ? Hypertension Other   ? Alzheimer's disease Other   ? Colon cancer Neg Hx   ? ?Past Medical History:  ?Diagnosis Date  ? Anxiety   ? Arthritis   ? Cancer [facility]) 2010  ? basal cell ca, head  ? Cataract   ? bilateral  ? Depression   ? ED (erectile dysfunction)   ? GERD (gastroesophageal reflux disease)   ? Hyperlipidemia   ? Hypertension   ? Kidney stone 07-29-11  ? passed   ? Neuromuscular disorder  ([place])   ? neuropathy legs  ? Vitreous floater   ? left eye, sees Dr. Dawna [facility] at [facility]   ? ?Current Outpatient Medications  ?Medication Sig Dispense Refill  ? acetaminophen (TYLENOL) 500 MG tablet Take 1,000 mg by mouth every 6 (six) hours as needed for mild pain.    ? aspirin 81 MG chewable tablet Chew 1 tablet (81 mg total) by mouth daily. 90 tablet 0  ? BRILINTA 90 MG TABS tablet TAKE ONE TABLET BY MOUTH EVERY MORNING and TAKE ONE TABLET BY MOUTH EVERY EVENING 120 tablet 1  ? carvedilol (COREG) 3.125 MG tablet TAKE ONE TABLET BY MOUTH EVERY MORNING and TAKE ONE TABLET BY MOUTH EVERY EVENING WITH A MEAL 180 tablet 1  ? Cholecalciferol (VITAMIN D) 50 MCG (2000 UT) CAPS Take 1 capsule by mouth daily.    ? dapagliflozin propanediol (FARXIGA) 10 MG TABS tablet Take 1 tablet (10 mg total) by mouth daily before breakfast. 30 tablet 11  ? diclofenac Sodium (VOLTAREN) 1 % GEL Apply 2 g topically 2 (two) times daily as needed (pain).    ? DULoxetine (CYMBALTA) 20 MG capsule Take 1 capsule (20 mg total) by mouth daily. 90 capsule 3  ? finasteride ([name]) 5 MG tablet Take 1 tablet (5 mg total) by mouth daily. 90 tablet 0  ? ketoconazole (NIZORAL) 2 % cream Apply 1 application topically daily as needed for irritation.    ? LORazepam (ATIVAN) 2 MG tablet Take 1 tablet (2 mg total) by mouth every 6 (six) hours as needed for anxiety. 120 tablet 5  ? melatonin 3 MG TABS tablet Take 1 tablet by mouth daily.    ? methocarbamol (ROBAXIN) 500 MG tablet Take 500 mg by mouth daily.    ? nitroGLYCERIN (NITROSTAT) 0.4 MG SL tablet Place 1 tablet (0.4 mg total) under the tongue every 5 (five) minutes as needed for chest pain. 50 tablet 3  ? omeprazole (PRILOSEC) 20 MG capsule Take 1 capsule (20 mg total) by mouth as needed. 30 capsule 11  ? rosuvastatin (CRESTOR) 10 MG tablet Take 1 tablet (10 mg total) by mouth daily. 90 tablet 3  ? sacubitril-valsartan (ENTRESTO) 24-26 MG Take 1 tablet by mouth 2 (two) times daily. 60  tablet 11  ? sildenafil (REVATIO) 20 MG tablet Take 20 mg by mouth as needed.    ? triamcinolone cream (KENALOG) 0.1 % Apply 1 application topically 2 (two) times daily as needed (psoriasis).    ? ?No current facility-administered medications for this encounter.  ? ?BP 112/68   Pulse 67   Wt 82.2 kg (181 lb 3.2 oz)   SpO2 97%   BMI 23.91 kg/m?  ? ?Wt Readings from Last 3 Encounters:  ?12/31/21 82.2 kg (181 lb  3.2 oz)  ?12/20/21 78.2 kg (172 lb 8 oz)  ?12/09/21 78.4 kg (172 lb 12.8 oz)  ? ?PHYSICAL EXAM: ?General:  NAD. No resp difficulty ?HEENT: Normal ?Neck: Supple. No JVD. Carotids 2+ bilat; no bruits. No lymphadenopathy or thryomegaly appreciated. ?Cor: PMI nondisplaced. Regular rate & rhythm. No rubs, gallops or murmurs. ?Lungs: Clear ?Abdomen: Soft, nontender, nondistended. No hepatosplenomegaly. No bruits or masses. Good bowel sounds. ?Extremities: No cyanosis, clubbing, rash, edema ?Neuro: Alert & oriented x 3, cranial nerves grossly intact. Moves all 4 extremities w/o difficulty. Affect pleasant. ? ?ASSESSMENT & PLAN:  ?1. CAD ?- VF arrest in setting of acute MI. Emergent cath (4/22) w/ pRCA occlusion s/p PCI + DES. ?- No s/s angina. ?- Continue ASA 81 mg daily. ?- Continue Brilinta 90 mg bid. ?- Continue Crestor 10 mg daily. ? ?2. Chronic Systolic Heart Failure  ?- ICM. Echo (4/22) LVEF 20-25%, RV moderately reduced. ?- Echo (9/22): EF 55-60% RV mildly reduced.  ?- NYHA I. Volume status looks good today. ?- Continue Entresto 24/26 mg bid. ?- Continue Farxiga 10 mg daily. No current GU symptoms. ?- Continue carvedilol 3.125 mg bid. Watch HR, if continues to drop, consider Zio +/- sleep study. Agree with reducing methocrbamol dose. ?- Off spiro, imdur, hydralazine w/ orthostasis + hyperkalemia. ?- Labs today. ? ?3. CKD IIIb-IV ?- Baseline SCr ~1.8-2.2. ?- Follows with Dr. Carolin Sicks. ?- BMET today. ?  ?4. Anoxic brain injury ?- Improved. ?- He has completed HH PT/OT/SLP. ?- PCP has cleared him to  drive. ? ?Follow up in 6 months. ? ?Rafael Bihari, FNP  ?11:28 AM ? ?Patient seen and examined with the above-signed Advanced Practice Provider and/or Housestaff. I personally reviewed laboratory data, imaging studies

## 2022-01-08 ENCOUNTER — Other Ambulatory Visit: Payer: Self-pay | Admitting: Family Medicine

## 2022-01-09 ENCOUNTER — Telehealth: Payer: Self-pay | Admitting: Pharmacist

## 2022-01-09 NOTE — Chronic Care Management (AMB) (Signed)
? ? ?Chronic Care Management ?Pharmacy Assistant  ? ?Name: [name]  MRN: [id number] DOB: 02-06-1951 ? ?Reason for Encounter: Medication Review / Medication Coordination Call ?  ?Conditions to be addressed/monitored: ?HTN ? ?Recent office visits:  ?12/20/2021 [name] Penna MD - Patient was seen for anxiety state. Started Duloxetine 20 mg daily. No follow up noted. ? ?Recent consult visits:  ?None ? ?Hospital visits:  ?None ? ?Medications: ?Outpatient Encounter Medications as of 01/09/2022  ?Medication Sig  ? acetaminophen (TYLENOL) 500 MG tablet Take 1,000 mg by mouth every 6 (six) hours as needed for mild pain.  ? aspirin 81 MG chewable tablet Chew 1 tablet (81 mg total) by mouth daily.  ? carvedilol (COREG) 3.125 MG tablet TAKE ONE TABLET BY MOUTH EVERY MORNING and TAKE ONE TABLET BY MOUTH EVERY EVENING WITH A MEAL  ? Cholecalciferol (VITAMIN D) 50 MCG (2000 UT) CAPS Take 1 capsule by mouth daily.  ? dapagliflozin propanediol (FARXIGA) 10 MG TABS tablet Take 1 tablet (10 mg total) by mouth daily before breakfast.  ? diclofenac Sodium (VOLTAREN) 1 % GEL Apply 2 g topically 2 (two) times daily as needed (pain).  ? DULoxetine (CYMBALTA) 20 MG capsule Take 1 capsule (20 mg total) by mouth daily.  ? finasteride ([name]) 5 MG tablet Take 1 tablet (5 mg total) by mouth daily.  ? ketoconazole (NIZORAL) 2 % cream Apply 1 application topically daily as needed for irritation.  ? LORazepam (ATIVAN) 2 MG tablet Take 1 tablet (2 mg total) by mouth every 6 (six) hours as needed for anxiety.  ? melatonin 3 MG TABS tablet Take 1 tablet by mouth daily.  ? methocarbamol (ROBAXIN) 500 MG tablet Take 500 mg by mouth daily.  ? nitroGLYCERIN (NITROSTAT) 0.4 MG SL tablet Place 1 tablet (0.4 mg total) under the tongue every 5 (five) minutes as needed for chest pain.  ? omeprazole (PRILOSEC) 20 MG capsule Take 1 capsule (20 mg total) by mouth as needed.  ? rosuvastatin (CRESTOR) 10 MG tablet Take 1 tablet (10 mg total) by mouth daily.   ? sacubitril-valsartan (ENTRESTO) 24-26 MG Take 1 tablet by mouth 2 (two) times daily.  ? sildenafil (REVATIO) 20 MG tablet Take 20 mg by mouth as needed.  ? ticagrelor (BRILINTA) 90 MG TABS tablet TAKE ONE TABLET BY MOUTH EVERY MORNING and TAKE ONE TABLET BY MOUTH EVERY EVENING  ? triamcinolone cream (KENALOG) 0.1 % Apply 1 application topically 2 (two) times daily as needed (psoriasis).  ? [DISCONTINUED] tadalafil (CIALIS) 20 MG tablet Take 20 mg by mouth daily as needed.    ? ?No facility-administered encounter medications on file as of 01/09/2022.  ? ?Reviewed chart for medication changes ahead of medication coordination call. ? ?No OVs, Consults, or hospital visits since last care coordination call/Pharmacist visit. (If appropriate, list visit date, provider name) ? ?No medication changes indicated OR if recent visit, treatment plan here. ? ?BP Readings from Last 3 Encounters:  ?12/31/21 112/68  ?12/20/21 110/62  ?12/09/21 122/65  ?  ?Lab Results  ?Component Value Date  ? HGBA1C 6.0 05/30/2021  ?  ? ?Patient obtains medications through Adherence Packaging  30 Days  ?  ?Last adherence pickup included:  ?Ticagrelor (BRILINTA) 90 mg: one tablet at breakfast and one tablet at dinner ?Farxiga 10 mg: one tablet before breakfast ?Rosuvastatin (CRESTOR) 10 mg: one tablet at breakfast ?Finasteride ([name]) 5 mg: one tablet before breakfast ?Carvedilol (COREG) 3.125 mg: one tablet at breakfast and one tablet at dinner ?  ?  Patient declined (meds) last month:  ?Omeprazole patient has plenty on hand  ?Entresto patient has plenty on hand.    ?  ?  ?Patient has requested next adherence pickup on: 01/20/2022 ?  ?Called patient and reviewed medications and coordinated delivery. ?  ?This delivery to include: ?Ticagrelor (BRILINTA) 90 mg: one tablet at breakfast and one tablet at dinner ?Farxiga 10 mg: one tablet before breakfast ?Rosuvastatin (CRESTOR) 10 mg: one tablet at breakfast ?Finasteride ([name]) 5 mg: one tablet  before breakfast ?Carvedilol (COREG) 3.125 mg: one tablet at breakfast and one tablet at dinner ?  ?Patient will need a short fill: Patient declines short fills ?  ?Coordinated acute fill: Patient declines acute fills ?  ?Patient declined the following medications:   ?Omeprazole patient has plenty on hand  ?Entresto patient has plenty on hand, he will call when refill needed. ?  ?Confirmed pick up date of 01/20/2022 between 11-12.   ? ?Care Gaps: ?AWV - completed 12/09/2021 ?Last BP - 112/68 on 12/31/2021 ?Last A1C - 6.0 on 05/30/2021 ?Zoster Vaccine - never done ?Foot exam - never done ?HGA1C - overdue ?  ?Star Rating Drugs: ?Farxiga '10mg'$  last filled 12/16/2021 30DS at [facility] ?Rosuvastatin 10 mg last filled 12/16/2021 at [facility] ? ?Gennie Alma CMA  ?Clinical Pharmacist Assistant ?[phone] ? ?

## 2022-01-20 DIAGNOSIS — N1832 Chronic kidney disease, stage 3b: Secondary | ICD-10-CM | POA: Diagnosis not present

## 2022-01-20 DIAGNOSIS — L570 Actinic keratosis: Secondary | ICD-10-CM | POA: Diagnosis not present

## 2022-01-20 DIAGNOSIS — B078 Other viral warts: Secondary | ICD-10-CM | POA: Diagnosis not present

## 2022-01-22 ENCOUNTER — Telehealth: Payer: Medicare PPO

## 2022-01-27 DIAGNOSIS — D631 Anemia in chronic kidney disease: Secondary | ICD-10-CM | POA: Diagnosis not present

## 2022-01-27 DIAGNOSIS — N1832 Chronic kidney disease, stage 3b: Secondary | ICD-10-CM | POA: Diagnosis not present

## 2022-01-27 DIAGNOSIS — N2581 Secondary hyperparathyroidism of renal origin: Secondary | ICD-10-CM | POA: Diagnosis not present

## 2022-01-27 DIAGNOSIS — N179 Acute kidney failure, unspecified: Secondary | ICD-10-CM | POA: Diagnosis not present

## 2022-01-27 DIAGNOSIS — I5020 Unspecified systolic (congestive) heart failure: Secondary | ICD-10-CM | POA: Diagnosis not present

## 2022-01-27 DIAGNOSIS — I129 Hypertensive chronic kidney disease with stage 1 through stage 4 chronic kidney disease, or unspecified chronic kidney disease: Secondary | ICD-10-CM | POA: Diagnosis not present

## 2022-01-27 DIAGNOSIS — I213 ST elevation (STEMI) myocardial infarction of unspecified site: Secondary | ICD-10-CM | POA: Diagnosis not present

## 2022-02-06 ENCOUNTER — Other Ambulatory Visit (HOSPITAL_COMMUNITY): Payer: Self-pay | Admitting: Family Medicine

## 2022-02-07 ENCOUNTER — Telehealth: Payer: Self-pay | Admitting: Pharmacist

## 2022-02-07 NOTE — Chronic Care Management (AMB) (Signed)
Chronic Care Management Pharmacy Assistant   Name: [name]  MRN: [id number] DOB: [birth year]  Reason for Encounter: Medication Review   Conditions to be addressed/monitored: HTN  Recent office visits:  None  Recent consult visits:  None  Hospital visits:  None  Medications: Outpatient Encounter Medications as of 02/07/2022  Medication Sig   acetaminophen (TYLENOL) 500 MG tablet Take 1,000 mg by mouth every 6 (six) hours as needed for mild pain.   aspirin 81 MG chewable tablet Chew 1 tablet (81 mg total) by mouth daily.   carvedilol (COREG) 3.125 MG tablet TAKE ONE TABLET BY MOUTH EVERY MORNING and TAKE ONE TABLET BY MOUTH EVERY EVENING WITH A MEAL   Cholecalciferol (VITAMIN D) 50 MCG (2000 UT) CAPS Take 1 capsule by mouth daily.   diclofenac Sodium (VOLTAREN) 1 % GEL Apply 2 g topically 2 (two) times daily as needed (pain).   DULoxetine (CYMBALTA) 20 MG capsule Take 1 capsule (20 mg total) by mouth daily.   FARXIGA 10 MG TABS tablet TAKE ONE TABLET BY MOUTH BEFORE BREAKFAST   finasteride ([name]) 5 MG tablet Take 1 tablet (5 mg total) by mouth daily.   ketoconazole (NIZORAL) 2 % cream Apply 1 application topically daily as needed for irritation.   LORazepam (ATIVAN) 2 MG tablet Take 1 tablet (2 mg total) by mouth every 6 (six) hours as needed for anxiety.   melatonin 3 MG TABS tablet Take 1 tablet by mouth daily.   methocarbamol (ROBAXIN) 500 MG tablet Take 500 mg by mouth daily.   nitroGLYCERIN (NITROSTAT) 0.4 MG SL tablet Place 1 tablet (0.4 mg total) under the tongue every 5 (five) minutes as needed for chest pain.   omeprazole (PRILOSEC) 20 MG capsule Take 1 capsule (20 mg total) by mouth as needed.   rosuvastatin (CRESTOR) 10 MG tablet Take 1 tablet (10 mg total) by mouth daily.   sacubitril-valsartan (ENTRESTO) 24-26 MG Take 1 tablet by mouth 2 (two) times daily.   sildenafil (REVATIO) 20 MG tablet Take 20 mg by mouth as needed.   ticagrelor (BRILINTA) 90 MG  TABS tablet TAKE ONE TABLET BY MOUTH EVERY MORNING and TAKE ONE TABLET BY MOUTH EVERY EVENING   triamcinolone cream (KENALOG) 0.1 % Apply 1 application topically 2 (two) times daily as needed (psoriasis).   [DISCONTINUED] tadalafil (CIALIS) 20 MG tablet Take 20 mg by mouth daily as needed.     No facility-administered encounter medications on file as of 02/07/2022.  Reviewed chart for medication changes ahead of medication coordination call.  No OVs, Consults, or hospital visits since last care coordination call/Pharmacist visit. (If appropriate, list visit date, provider name)  No medication changes indicated OR if recent visit, treatment plan here.  BP Readings from Last 3 Encounters:  12/31/21 112/68  12/20/21 110/62  12/09/21 122/65    Lab Results  Component Value Date   HGBA1C 6.0 05/30/2021     Patient obtains medications through Adherence Packaging  30 Days    Last adherence pickup included:  Ticagrelor (BRILINTA) 90 mg: one tablet at breakfast and one tablet at dinner Farxiga 10 mg: one tablet before breakfast Rosuvastatin (CRESTOR) 10 mg: one tablet at breakfast Finasteride ([name]) 5 mg: one tablet before breakfast Carvedilol (COREG) 3.125 mg: one tablet at breakfast and one tablet at dinner   Patient declined (meds) last month:  Omeprazole patient has plenty on hand  Entresto patient has plenty on hand.        Patient has requested next  adherence pickup on: 02/19/2022   Called patient and reviewed medications and coordinated delivery.   This delivery to include: Ticagrelor (BRILINTA) 90 mg: 1 tablet at breakfast and 1 tablet at dinner Farxiga 10 mg: one tablet before breakfast Rosuvastatin  10 mg: one tablet at breakfast Finasteride  5 mg: one tablet before breakfast Carvedilol  3.125 mg: 1 tablet at breakfast and 1 tablet at dinner  Patient will need a short fill: Patient declines short fills   Coordinated acute fill: Patient declines acute fills    Patient declined the following medications:   Entresto patient has plenty on hand - filled 02/03/22 90 DS Omeprazole 20 mg  - one tablet as needed (pt has plenty on hand)  Confirmed delivery date of 02/19/2022, advised patient that pharmacy will contact them the morning of delivery.   Care Gaps: AWV - completed 12/09/2021 Last BP - 112/68 on 12/31/2021 Last A1C - 6.0 on 05/30/2021 Foot exam - never done Shingrix - never done [facility] - overdue  Star Rating Drugs: Farxiga 10 mg - last filled 01/14/2022 30 DS at [facility] Rosuvastatin 10 mg  - last filled 01/14/2022 30 DS at [facility] [phone]

## 2022-02-17 ENCOUNTER — Ambulatory Visit (HOSPITAL_COMMUNITY)
Admission: RE | Admit: 2022-02-17 | Discharge: 2022-02-17 | Disposition: A | Discharge status: Home / Self Care | Payer: Medicare PPO | Source: Ambulatory Visit | Attending: Family Medicine | Admitting: Family Medicine

## 2022-02-17 ENCOUNTER — Encounter (HOSPITAL_COMMUNITY): Payer: Self-pay

## 2022-02-17 VITALS — BP 145/70 | HR 64 | Temp 97.9°F | Resp 16

## 2022-02-17 DIAGNOSIS — S81831A Puncture wound without foreign body, right lower leg, initial encounter: Secondary | ICD-10-CM

## 2022-02-17 DIAGNOSIS — W57XXXA Bitten or stung by nonvenomous insect and other nonvenomous arthropods, initial encounter: Secondary | ICD-10-CM

## 2022-02-17 MED ORDER — DOXYCYCLINE HYCLATE 100 MG PO CAPS: 100.0000 mg | ORAL_CAPSULE | Freq: Two times a day (BID) | ORAL | 0 refills | Status: AC

## 2022-02-17 NOTE — ED Provider Notes (Signed)
[place]    CSN: [id number] Arrival date & time: 02/17/22  1543      History   Chief Complaint Chief Complaint  Patient presents with   Tick Removal    HPI [name] is a 71 y.o. male.   HPI Patient with history diabetes, CAD, presents today requesting removal of a tick from his right lower posterior leg.  He noticed the tick present earlier today.  He also noticed some mild redness at the site in which tick was attached.  He was afraid to remove it given the positioning of the tick on the back of the leg.   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 Dawna [facility] at [facility]     Patient Active Problem List   Diagnosis Date Noted   CAD, multiple vessel 09/03/2021   ST elevation myocardial infarction (STEMI) ([place]) 09/03/2021   Type 2 diabetes mellitus with diabetic chronic kidney disease ([place]) 05/30/2021   CKD (chronic kidney disease), stage III ([place]) 05/30/2021   Hypertension    Hemoptysis    Acute hypoxemic respiratory failure (HCC)    Acute renal failure (HCC)    Encephalopathy acute    Cardiogenic shock ([place]) 01/03/2021   Atypical chest pain 10/25/2019   Coronary artery calcification seen on CAT scan 06/15/2019   Nodule of lower lobe of left lung 06/15/2019   Tremor of right hand 04/24/2017   Numbness and tingling of both legs 04/24/2017   Bilateral leg edema 06/05/2015   Chronic neck pain 06/05/2015   Plantar fasciitis 02/01/2014   Visual floaters 04/04/2013   Subjective vision disturbance, left 04/04/2013   BPH with urinary obstruction 01/01/2010   MELANOMA, HX OF 06/01/2009   NECK PAIN 09/11/2008    CELLULITIS AND ABSCESS OF LEG EXCEPT FOOT 04/26/2008   ERECTILE DYSFUNCTION 10/12/2007   Hyperlipemia, mixed 04/12/2007   Anxiety state 04/12/2007   Essential hypertension 04/12/2007   GERD 04/12/2007    Past Surgical History:  Procedure Laterality Date   BASAL CELL CARCINOMA EXCISION     removed from scalp   COLONOSCOPY  07/03/2017   per Dr. Havery Moros, sessile serrated polyp, repeat in 5 yrs    [place] CATH AND CORONARY ANGIOGRAPHY N/A 01/03/2021   Procedure: LEFT HEART CATH AND CORONARY ANGIOGRAPHY;  Surgeon: Troy Sine, MD;  Location: [place] [facility];  Service: Cardiovascular;  Laterality: N/A;   RIGHT HEART CATH N/A 01/03/2021   Procedure: RIGHT HEART CATH;  Surgeon: Jolaine Artist, MD;  Location: [place] [facility];  Service: Cardiovascular;  Laterality: N/A;       Home Medications    Prior to Admission medications   Medication Sig Start Date End Date Taking? Authorizing Provider  doxycycline (VIBRAMYCIN) 100 MG capsule Take 1 capsule (100 mg total) by mouth 2 (two) times daily for 7 days. 02/17/22 02/24/22 Yes Scot Jun, FNP  acetaminophen (TYLENOL) 500 MG tablet Take 1,000 mg by mouth every 6 (six) hours as needed for mild  pain.    [provider]  aspirin 81 MG chewable tablet Chew 1 tablet (81 mg total) by mouth daily. 03/21/21   [name], MD  carvedilol (COREG) 3.125 MG tablet TAKE ONE TABLET BY MOUTH EVERY MORNING and TAKE ONE TABLET BY MOUTH EVERY EVENING WITH A MEAL 09/11/21   [name], MD  Cholecalciferol (VITAMIN D) 50 MCG (2000 UT) CAPS Take 1 capsule by mouth daily.    [provider]  diclofenac Sodium (VOLTAREN) 1 % GEL Apply 2 g topically 2 (two) times daily as needed (pain).    [provider]  DULoxetine (CYMBALTA) 20 MG capsule Take 1 capsule (20 mg total) by mouth daily. 12/20/21   [name], MD  FARXIGA 10 MG TABS tablet TAKE ONE TABLET BY MOUTH BEFORE BREAKFAST 02/06/22    [place], Maricela Bo, FNP  finasteride ([name]) 5 MG tablet Take 1 tablet (5 mg total) by mouth daily. 03/21/21   [name], MD  ketoconazole (NIZORAL) 2 % cream Apply 1 application topically daily as needed for irritation.    [provider]  LORazepam (ATIVAN) 2 MG tablet Take 1 tablet (2 mg total) by mouth every 6 (six) hours as needed for anxiety. 09/03/21   [name], MD  melatonin 3 MG TABS tablet Take 1 tablet by mouth daily.    [provider]  methocarbamol (ROBAXIN) 500 MG tablet Take 500 mg by mouth daily.    [provider]  nitroGLYCERIN (NITROSTAT) 0.4 MG SL tablet Place 1 tablet (0.4 mg total) under the tongue every 5 (five) minutes as needed for chest pain. 02/14/21   [name], MD  omeprazole (PRILOSEC) 20 MG capsule Take 1 capsule (20 mg total) by mouth as needed. 05/30/21   [name], MD  rosuvastatin (CRESTOR) 10 MG tablet Take 1 tablet (10 mg total) by mouth daily. 05/30/21   [name], MD  sacubitril-valsartan (ENTRESTO) 24-26 MG Take 1 tablet by mouth 2 (two) times daily. 04/16/21   Rafael Bihari, FNP  sildenafil (REVATIO) 20 MG tablet Take 20 mg by mouth as needed.    [provider]  ticagrelor (BRILINTA) 90 MG TABS tablet TAKE ONE TABLET BY MOUTH EVERY MORNING and TAKE ONE TABLET BY MOUTH EVERY EVENING 01/08/22   [name], MD  triamcinolone cream (KENALOG) 0.1 % Apply 1 application topically 2 (two) times daily as needed (psoriasis).    [provider]  tadalafil (CIALIS) 20 MG tablet Take 20 mg by mouth daily as needed.    12/17/11  [provider]    Family History Family History  Problem Relation Age of Onset   Hypertension Mother    Hyperlipidemia Mother    Depression Other    Diabetes Other    Hyperlipidemia Other    Hypertension Other    Alzheimer's disease Other    Colon cancer Neg Hx     Social History Social History   Tobacco Use   Smoking status: Never   Smokeless  tobacco: Never  Vaping Use   Vaping Use: Never used  Substance Use Topics   Alcohol use: No    Alcohol/week: 0.0 standard drinks   Drug use: No     Allergies   Codeine and Lisinopril   Review of Systems Review of Systems Pertinent negatives listed in HPI  Physical Exam Triage Vital Signs ED Triage Vitals [02/17/22 1616]  Enc Vitals Group     BP (!) 145/70  Pulse Rate 64     Resp 16     Temp 97.9 F (36.6 C)     Temp Source Oral     SpO2 100 %     Weight      Height      Head Circumference      Peak Flow      Pain Score      Pain Loc      Pain Edu?      Excl. in [place]?    No data found.  Updated Vital Signs BP (!) 145/70 (BP Location: Left Arm)   Pulse 64   Temp 97.9 F (36.6 C) (Oral)   Resp 16   SpO2 100%   Visual Acuity Right Eye Distance:   Left Eye Distance:   Bilateral Distance:    Right Eye Near:   Left Eye Near:    Bilateral Near:     Physical Exam Constitutional:      Appearance: Normal appearance.  Cardiovascular:     Rate and Rhythm: Normal rate and regular rhythm.  Pulmonary:     Effort: Pulmonary effort is normal.     Breath sounds: Normal breath sounds.  Musculoskeletal:       Legs:  Neurological:     Mental Status: He is alert.   Procedure Note  Betadine used to clean surrounding skin Sterile forceps, utilized to removed intact, live, tick. 4 mm puncture wound present.  Scant bleeding present.  Area cleansed and dressed with Band-Aid Patient tolerated procedure UC Treatments / Results  Labs (all labs ordered are listed, but only abnormal results are displayed) Labs Reviewed - No data to display  EKG   Radiology No results found.  Procedures Procedures (including critical care time)  Medications Ordered in UC Medications - No data to display  Initial Impression / Assessment and Plan / UC Course  I have reviewed the triage vital signs and the nursing notes.  Pertinent labs & imaging results that were  available during my care of the patient were reviewed by me and considered in my medical decision making (see chart for details).    Tick bite with removal of tick with puncture wound Will initiate doxycycline for tickborne illness prophylaxis. Follow-up as needed. Final Clinical Impressions(s) / UC Diagnoses   Final diagnoses:  Tick bite with subsequent removal of tick  Puncture wound of right lower leg, initial encounter   Discharge Instructions   None    ED Prescriptions     Medication Sig Dispense Auth. Provider   doxycycline (VIBRAMYCIN) 100 MG capsule Take 1 capsule (100 mg total) by mouth 2 (two) times daily for 7 days. 14 capsule Scot Jun, FNP      PDMP not reviewed this encounter.   Scot Jun, FNP 02/17/22 2021

## 2022-02-17 NOTE — ED Triage Notes (Signed)
Pt thinks he was bit by a tick on the back of his right leg. He woke up this morning with pain and swelling.

## 2022-02-21 ENCOUNTER — Encounter: Payer: Self-pay | Admitting: Family Medicine

## 2022-02-21 ENCOUNTER — Ambulatory Visit (INDEPENDENT_AMBULATORY_CARE_PROVIDER_SITE_OTHER): Payer: Medicare PPO

## 2022-02-21 ENCOUNTER — Ambulatory Visit: Payer: Medicare PPO | Admitting: Family Medicine

## 2022-02-21 VITALS — BP 126/70 | HR 59 | Temp 98.5°F | Ht 73.0 in | Wt 177.6 lb

## 2022-02-21 DIAGNOSIS — F411 Generalized anxiety disorder: Secondary | ICD-10-CM | POA: Diagnosis not present

## 2022-02-21 DIAGNOSIS — I10 Essential (primary) hypertension: Secondary | ICD-10-CM

## 2022-02-21 DIAGNOSIS — I2510 Atherosclerotic heart disease of native coronary artery without angina pectoris: Secondary | ICD-10-CM | POA: Diagnosis not present

## 2022-02-21 DIAGNOSIS — E782 Mixed hyperlipidemia: Secondary | ICD-10-CM

## 2022-02-21 DIAGNOSIS — N1832 Chronic kidney disease, stage 3b: Secondary | ICD-10-CM | POA: Diagnosis not present

## 2022-02-21 DIAGNOSIS — E1122 Type 2 diabetes mellitus with diabetic chronic kidney disease: Secondary | ICD-10-CM

## 2022-02-21 LAB — POCT GLYCOSYLATED HEMOGLOBIN (HGB A1C): Hemoglobin A1C: 5.3 % (ref 4.0–5.6)

## 2022-02-21 MED ORDER — LORAZEPAM 2 MG PO TABS: 2.0000 mg | ORAL_TABLET | Freq: Four times a day (QID) | ORAL | 5 refills | Status: DC | PRN

## 2022-02-21 NOTE — Patient Instructions (Signed)
Visit Information  Thank you for taking time to visit with me today. Please don't hesitate to contact me if I can be of assistance to you before our next scheduled telephone appointment.  Following are the goals we discussed today:  Take all medications as prescribed Attend all scheduled provider appointments Call provider office for new concerns or questions  call office if I gain more than 2 pounds in one day or 5 pounds in one week keep legs up while sitting track weight in diary watch for swelling in feet, ankles and legs every day weigh myself daily follow rescue plan if symptoms flare-up eat more whole grains, fruits and vegetables, lean meats and healthy fats track symptoms and what helps feel better or worse dress right for the weather, hot or cold check blood pressure daily choose a place to take my blood pressure (home, clinic or office, retail store) take blood pressure log to all doctor appointments begin an exercise program eat more whole grains, fruits and vegetables, lean meats and healthy fats limit salt intake to '1500mg'$ /day call for medicine refill 2 or 3 days before it runs out take all medications exactly as prescribed call doctor with any symptoms you believe are related to your medicine  Our next appointment is by telephone on 06/13/22 at 10 AM  Please call the care guide team at [phone] if you need to cancel or reschedule your appointment.   If you are experiencing a Mental Health or [place] or need someone to talk to, please call the Suicide and Crisis Lifeline: 988 call the [place] National Suicide Prevention Lifeline: 204-366-5054 or TTY: (315) 195-1602 TTY 805 592 8954) to talk to a trained counselor call 1-800-273-TALK (toll free, 24 hour hotline) go to [facility] [address], [place] [phone]) call 911   Patient verbalizes understanding of instructions and care plan provided today and  agrees to view in [place]. Active MyChart status and patient understanding of how to access instructions and care plan via MyChart confirmed with patient.     Peter Garter RN, Jackquline Denmark, CDE Care Management Coordinator [facility] [phone]

## 2022-02-21 NOTE — Chronic Care Management (AMB) (Signed)
Chronic Care Management   CCM RN Visit Note  02/21/2022 Name: [name] MRN: [id number] DOB: [birth year]  Subjective: [name] is a 71 y.o. year old male who is a primary care patient of [name], MD. The care management team was consulted for assistance with disease management and care coordination needs.    Engaged with patient by telephone for follow up visit in response to provider referral for case management and/or care coordination services.   Consent to Services:  The patient was given information about Chronic Care Management services, agreed to services, and gave verbal consent prior to initiation of services.  Please see initial visit note for detailed documentation.   Patient agreed to services and verbal consent obtained.   Assessment: Review of patient past medical history, allergies, medications, health status, including review of consultants reports, laboratory and other test data, was performed as part of comprehensive evaluation and provision of chronic care management services.   SDOH (Social Determinants of Health) assessments and interventions performed:    CCM Care Plan  Allergies  Allergen Reactions   Codeine Other (See Comments)    hallucinations   Lisinopril Cough    Outpatient Encounter Medications as of 02/21/2022  Medication Sig   acetaminophen (TYLENOL) 500 MG tablet Take 1,000 mg by mouth every 6 (six) hours as needed for mild pain.   aspirin 81 MG chewable tablet Chew 1 tablet (81 mg total) by mouth daily.   carvedilol (COREG) 3.125 MG tablet TAKE ONE TABLET BY MOUTH EVERY MORNING and TAKE ONE TABLET BY MOUTH EVERY EVENING WITH A MEAL   Cholecalciferol (VITAMIN D) 50 MCG (2000 UT) CAPS Take 1 capsule by mouth daily.   diclofenac Sodium (VOLTAREN) 1 % GEL Apply 2 g topically 2 (two) times daily as needed (pain).   doxycycline (VIBRAMYCIN) 100 MG capsule Take 1 capsule (100 mg total) by mouth 2 (two) times daily for 7 days.    DULoxetine (CYMBALTA) 20 MG capsule Take 1 capsule (20 mg total) by mouth daily.   FARXIGA 10 MG TABS tablet TAKE ONE TABLET BY MOUTH BEFORE BREAKFAST   finasteride ([name]) 5 MG tablet Take 1 tablet (5 mg total) by mouth daily.   ketoconazole (NIZORAL) 2 % cream Apply 1 application topically daily as needed for irritation.   LORazepam (ATIVAN) 2 MG tablet Take 1 tablet (2 mg total) by mouth every 6 (six) hours as needed for anxiety.   melatonin 3 MG TABS tablet Take 1 tablet by mouth daily.   methocarbamol (ROBAXIN) 500 MG tablet Take 500 mg by mouth daily.   nitroGLYCERIN (NITROSTAT) 0.4 MG SL tablet Place 1 tablet (0.4 mg total) under the tongue every 5 (five) minutes as needed for chest pain.   omeprazole (PRILOSEC) 20 MG capsule Take 1 capsule (20 mg total) by mouth as needed.   rosuvastatin (CRESTOR) 10 MG tablet Take 1 tablet (10 mg total) by mouth daily.   sacubitril-valsartan (ENTRESTO) 24-26 MG Take 1 tablet by mouth 2 (two) times daily.   sildenafil (REVATIO) 20 MG tablet Take 20 mg by mouth as needed.   ticagrelor (BRILINTA) 90 MG TABS tablet TAKE ONE TABLET BY MOUTH EVERY MORNING and TAKE ONE TABLET BY MOUTH EVERY EVENING   triamcinolone cream (KENALOG) 0.1 % Apply 1 application topically 2 (two) times daily as needed (psoriasis).   [DISCONTINUED] tadalafil (CIALIS) 20 MG tablet Take 20 mg by mouth daily as needed.     No facility-administered encounter medications on file as of 02/21/2022.  Patient Active Problem List   Diagnosis Date Noted   CAD, multiple vessel 09/03/2021   ST elevation myocardial infarction (STEMI) ([place]) 09/03/2021   Type 2 diabetes mellitus with diabetic chronic kidney disease ([place]) 05/30/2021   CKD (chronic kidney disease), stage III ([place]) 05/30/2021   Hypertension    Hemoptysis    Acute hypoxemic respiratory failure (HCC)    Acute renal failure (HCC)    Encephalopathy acute    Cardiogenic shock ([place]) 01/03/2021   Atypical chest pain 10/25/2019    Coronary artery calcification seen on CAT scan 06/15/2019   Nodule of lower lobe of left lung 06/15/2019   Tremor of right hand 04/24/2017   Numbness and tingling of both legs 04/24/2017   Bilateral leg edema 06/05/2015   Chronic neck pain 06/05/2015   Plantar fasciitis 02/01/2014   Visual floaters 04/04/2013   Subjective vision disturbance, left 04/04/2013   BPH with urinary obstruction 01/01/2010   MELANOMA, HX OF 06/01/2009   NECK PAIN 09/11/2008   CELLULITIS AND ABSCESS OF LEG EXCEPT FOOT 04/26/2008   ERECTILE DYSFUNCTION 10/12/2007   Hyperlipemia, mixed 04/12/2007   Anxiety state 04/12/2007   Essential hypertension 04/12/2007   GERD 04/12/2007    Conditions to be addressed/monitored:CHF, CAD, HTN, and HLD  Care Plan : RN Care Manager Plan of Care  Updates made by Dimitri Ped, RN since 02/21/2022 12:00 AM     Problem: Chronic Disease Management and Care Coordination Needs(HF, CAD, HTN, HLD)   Priority: High     Long-Range Goal: Establish Plan of Care for Chronic Disease Management Needs (HF, CAD, HTN, HLD)   Start Date: 08/30/2021  Expected End Date: 02/21/2023  Priority: High  Note:   Current Barriers:  Chronic Disease Management support and education needs related to CHF, CAD, HTN, and HLD    States he has been feeling good  States he  exercising and walking daily.  States he now going [facility] [employer] to exercise 3 times a week. Denies any chest pains, swelling or shortness of breath. States he is weighing, checking B/P and O2 sat daily.  States his weight has been stable between 173-175.  B/P was 122/65 today.  States he is keeping a log of his readings  States he has been following a low sodium diet and trying to eat more vegetables and fruits. States he follows his  fluid restriction.  States he  saw the kidney doctor  in May.  States hs anxiety and sleep is much better since he started taking the Cymbalta.  RNCM Clinical Goal(s):  Patient will verbalize  understanding of plan for management of CHF, CAD, HTN, and HLD as evidenced by voiced adherence to plan of care verbalize basic understanding of  CHF, CAD, HTN, and HLD disease process and self health management plan as evidenced by voiced understanding and teach back take all medications exactly as prescribed and will call provider for medication related questions as evidenced by dispense report and pt verbalization attend all scheduled medical appointments: CCM Pharm D 06/04/22, Dr. Sarajane Jews 02/21/22 Dr. Haroldine Laws October,  nephrology August 2023  as evidenced by medical records demonstrate Ongoing adherence to prescribed treatment plan for CHF, CAD, HTN, and HLD as evidenced by readings within limits, voiced adherence to plan of care continue to work with RN Care Manager to address care management and care coordination needs related to  CHF, CAD, HTN, and HLD as evidenced by adherence to CM Team Scheduled appointments through collaboration with RN Care manager,  provider, and care team.   Interventions: 1:1 collaboration with primary care provider regarding development and update of comprehensive plan of care as evidenced by provider attestation and co-signature Inter-disciplinary care team collaboration (see longitudinal plan of care) Evaluation of current treatment plan related to  self management and patient's adherence to plan as established by provider    CAD Interventions: (Status:  Goal on track:  Yes.) Long Term Goal Assessed understanding of CAD diagnosis Medications reviewed including medications utilized in CAD treatment plan Provided education on importance of blood pressure control in management of CAD Counseled on importance of regular laboratory monitoring as prescribed Counseled on the importance of exercise goals with target of 150 minutes per week Reviewed Importance of taking all medications as prescribed Reviewed Importance of attending all scheduled provider appointments Advised  to report any changes in symptoms or exercise tolerance Reviewed to continue his exercise and continue to go to the gym to exercise   Heart Failure Interventions:  (Status:  Goal on track:  Yes.) Long Term Goal Provided education on low sodium diet Reviewed Heart Failure Action Plan in depth and provided written copy Discussed importance of daily weight and advised patient to weigh and record daily Reviewed role of diuretics in prevention of fluid overload and management of heart failure; Discussed the importance of keeping all appointments with provider Provided patient with education about the role of exercise in the management of heart failure Reviewed s/sx of HF to call MD  Hyperlipidemia Interventions:  (Status:  Condition stable.  Not addressed this visit.) Long Term Goal Medication review performed; medication list updated in electronic medical record.  Provider established cholesterol goals reviewed Counseled on importance of regular laboratory monitoring as prescribed Reviewed role and benefits of statin for ASCVD risk reduction Reviewed importance of limiting foods high in cholesterol Reviewed exercise goals and target of 150 minutes per week  Hypertension Interventions:  (Status:  Goal on track:  Yes.) Long Term Goal Last practice recorded BP readings:  BP Readings from Last 3 Encounters:  02/17/22 (!) 145/70  12/31/21 112/68  12/20/21 110/62  Most recent eGFR/CrCl: No results found for: EGFR  No components found for: CRCL  Evaluation of current treatment plan related to hypertension self management and patient's adherence to plan as established by provider Provided education to patient re: stroke prevention, s/s of heart attack and stroke Counseled on the importance of exercise goals with target of 150 minutes per week Discussed plans with patient for ongoing care management follow up and provided patient with direct contact information for care management team Advised  patient, providing education and rationale, to monitor blood pressure daily and record, calling PCP for findings outside established parameters Provided education on prescribed diet low sodium heart healthy Reviewed to notify doctor if his B/P increases and stays elevated  Patient Goals/Self-Care Activities: Take all medications as prescribed Attend all scheduled provider appointments Call provider office for new concerns or questions  call office if I gain more than 2 pounds in one day or 5 pounds in one week keep legs up while sitting track weight in diary watch for swelling in feet, ankles and legs every day weigh myself daily follow rescue plan if symptoms flare-up eat more whole grains, fruits and vegetables, lean meats and healthy fats track symptoms and what helps feel better or worse dress right for the weather, hot or cold check blood pressure daily choose a place to take my blood pressure (home, clinic or office, retail store) take blood  pressure log to all doctor appointments begin an exercise program eat more whole grains, fruits and vegetables, lean meats and healthy fats limit salt intake to 1521m/day call for medicine refill 2 or 3 days before it runs out take all medications exactly as prescribed call doctor with any symptoms you believe are related to your medicine  Follow Up Plan:  Telephone follow up appointment with care management team member scheduled for:  06/13/22 The patient has been provided with contact information for the care management team and has been advised to call with any health related questions or concerns.       Plan:Telephone follow up appointment with care management team member scheduled for:  06/13/22 The patient has been provided with contact information for the care management team and has been advised to call with any health related questions or concerns.  MPeter GarterRN, BJackquline Denmark CDE Care Management Coordinator   [facility] [phone]

## 2022-02-21 NOTE — Progress Notes (Signed)
   Subjective:    Patient ID: [name], male    DOB: 1951-04-20, 71 y.o.   MRN: [id number]  HPI Here to follow up. He is doing extremely well all in all. His BP is stable. He is working out at his gym 3 days a week, and he has no concerns. His A1c today is 5.3. After we started him back on Duloxetine his anxiety has been very well controlled. He sleeps well.    Review of Systems  Constitutional: Negative.   Respiratory: Negative.    Cardiovascular: Negative.   Gastrointestinal: Negative.   Neurological: Negative.   Psychiatric/Behavioral: Negative.        Objective:   Physical Exam Constitutional:      Appearance: Normal appearance. He is not ill-appearing.  Cardiovascular:     Rate and Rhythm: Normal rate and regular rhythm.     Pulses: Normal pulses.     Heart sounds: Normal heart sounds.  Pulmonary:     Effort: Pulmonary effort is normal.     Breath sounds: Normal breath sounds.  Neurological:     General: No focal deficit present.     Mental Status: He is alert and oriented to person, place, and time.  Psychiatric:        Mood and Affect: Mood normal.        Behavior: Behavior normal.        Thought Content: Thought content normal.          Assessment & Plan:  He is doing great now. His HTN and CAD and CKD are stable. His GERD and diabetes are stable. His anxiety is well controlled. He will return for his well exam in September.  [name] Penna, MD

## 2022-03-07 ENCOUNTER — Other Ambulatory Visit: Payer: Self-pay | Admitting: Family Medicine

## 2022-03-10 ENCOUNTER — Telehealth: Payer: Self-pay | Admitting: Pharmacist

## 2022-03-10 NOTE — Chronic Care Management (AMB) (Signed)
Chronic Care Management Pharmacy Assistant   Name: [name]  MRN: [id number] DOB: 24-Dec-1950  Reason for Encounter: Medication Review / Medication Coordination Call   Conditions to be addressed/monitored: HTN  Recent office visits:  02/21/2021 [name] Penna MD - Patient was seen for anxiety and additional issues. Discontinued Methocarbamol. No follow up noted.   Recent consult visits:  None  Hospital visits:  Patient was seen at [facility] on 02/17/2022 (1 hour) due to Tick bite with subsequent removal of tick.  New?Medications Started at [facility] Discharge:?? doxycycline 100 MG capsule Take 1 capsule (100 mg total) by mouth 2 (two) times daily for 7 days. Medication Changes at Hospital Discharge: No medication changes Medications Discontinued at Hospital Discharge: No medications discontinued Medications that remain the same after Hospital Discharge:??  -All other medications will remain the same.    Medications: Outpatient Encounter Medications as of 03/10/2022  Medication Sig   acetaminophen (TYLENOL) 500 MG tablet Take 1,000 mg by mouth every 6 (six) hours as needed for mild pain.   aspirin 81 MG chewable tablet Chew 1 tablet (81 mg total) by mouth daily.   BRILINTA 90 MG TABS tablet TAKE ONE TABLET BY MOUTH EVERY MORNING and TAKE ONE TABLET BY MOUTH EVERY EVENING   carvedilol (COREG) 3.125 MG tablet TAKE ONE TABLET BY MOUTH EVERY MORNING and TAKE ONE TABLET BY MOUTH EVERY EVENING   Cholecalciferol (VITAMIN D) 50 MCG (2000 UT) CAPS Take 1 capsule by mouth daily.   diclofenac Sodium (VOLTAREN) 1 % GEL Apply 2 g topically 2 (two) times daily as needed (pain).   DULoxetine (CYMBALTA) 20 MG capsule Take 1 capsule (20 mg total) by mouth daily.   FARXIGA 10 MG TABS tablet TAKE ONE TABLET BY MOUTH BEFORE BREAKFAST   finasteride ([name]) 5 MG tablet Take 1 tablet (5 mg total) by mouth daily.   ketoconazole (NIZORAL) 2 % cream Apply 1 application topically  daily as needed for irritation.   LORazepam (ATIVAN) 2 MG tablet Take 1 tablet (2 mg total) by mouth every 6 (six) hours as needed for anxiety.   melatonin 3 MG TABS tablet Take 1 tablet by mouth daily.   nitroGLYCERIN (NITROSTAT) 0.4 MG SL tablet Place 1 tablet (0.4 mg total) under the tongue every 5 (five) minutes as needed for chest pain.   omeprazole (PRILOSEC) 20 MG capsule Take 1 capsule (20 mg total) by mouth as needed.   rosuvastatin (CRESTOR) 10 MG tablet Take 1 tablet (10 mg total) by mouth daily.   sacubitril-valsartan (ENTRESTO) 24-26 MG Take 1 tablet by mouth 2 (two) times daily.   sildenafil (REVATIO) 20 MG tablet Take 20 mg by mouth as needed.   triamcinolone cream (KENALOG) 0.1 % Apply 1 application topically 2 (two) times daily as needed (psoriasis).   [DISCONTINUED] tadalafil (CIALIS) 20 MG tablet Take 20 mg by mouth daily as needed.     No facility-administered encounter medications on file as of 03/10/2022.   Reviewed chart for medication changes ahead of medication coordination call.  No OVs, Consults, or hospital visits since last care coordination call/Pharmacist visit. (If appropriate, list visit date, provider name)  No medication changes indicated OR if recent visit, treatment plan here.  BP Readings from Last 3 Encounters:  02/21/22 126/70  02/17/22 (!) 145/70  12/31/21 112/68    Lab Results  Component Value Date   HGBA1C 5.3 02/21/2022     Patient obtains medications through Adherence Packaging  30 Days  Last adherence pickup included:  Ticagrelor (BRILINTA) 90 mg: 1 tablet at breakfast and 1 tablet at dinner Farxiga 10 mg: one tablet before breakfast Rosuvastatin  10 mg: one tablet at breakfast Finasteride  5 mg: one tablet before breakfast Carvedilol  3.125 mg: 1 tablet at breakfast and 1 tablet at dinner   Patient declined (meds) last month:  Omeprazole patient has plenty on hand  Entresto patient has plenty on hand.        Next adherence  delivery scheduled on: 03/20/2022   Called patient and reviewed medications and coordinated delivery.   This delivery to include: Ticagrelor (BRILINTA) 90 mg: 1 tablet at breakfast and 1 tablet at dinner Farxiga 10 mg: one tablet before breakfast Rosuvastatin  10 mg: one tablet at breakfast Finasteride  5 mg: one tablet before breakfast Carvedilol  3.125 mg: 1 tablet at breakfast and 1 tablet at dinner Omeprazole 20 mg  - one tablet as needed (Vial)  Patient will need a short fill: Patient declines short fills   Coordinated acute fill: Patient declines acute fills   Patient declined the following medications:   Entresto patient has plenty on hand (not due til 05/06/22)   Patient is requesting delivery date to be on 03/19/2022, advised patient that pharmacy will contact them the morning of delivery.   Care Gaps: AWV - completed 12/09/2021 Last BP - 126/70 on 02/21/2022 Last A1C - 5.3 on 02/21/2022 Foot exam - never done Shingrix - never done Covid booster - overdue  Star Rating Drugs: Farxiga 10 mg - last filled 02/12/2022 30 DS at [facility] Rosuvastatin 10 mg  - last filled 02/12/2022 30 DS at [facility] [phone]

## 2022-03-20 ENCOUNTER — Ambulatory Visit (HOSPITAL_COMMUNITY)
Admission: EM | Admit: 2022-03-20 | Discharge: 2022-03-20 | Disposition: A | Discharge status: Home / Self Care | Payer: Medicare PPO | Attending: Internal Medicine | Admitting: Internal Medicine

## 2022-03-20 ENCOUNTER — Ambulatory Visit: Payer: Self-pay

## 2022-03-20 ENCOUNTER — Encounter (HOSPITAL_COMMUNITY): Payer: Self-pay | Admitting: *Deleted

## 2022-03-20 DIAGNOSIS — M533 Sacrococcygeal disorders, not elsewhere classified: Secondary | ICD-10-CM

## 2022-03-20 DIAGNOSIS — I10 Essential (primary) hypertension: Secondary | ICD-10-CM

## 2022-03-20 DIAGNOSIS — I2510 Atherosclerotic heart disease of native coronary artery without angina pectoris: Secondary | ICD-10-CM

## 2022-03-20 HISTORY — DX: Anoxic brain damage, not elsewhere classified: G93.1

## 2022-03-20 HISTORY — DX: Psoriasis, unspecified: L40.9

## 2022-03-20 HISTORY — DX: Heart failure, unspecified: I50.9

## 2022-03-20 LAB — POCT URINALYSIS DIPSTICK, ED / UC
Bilirubin Urine: NEGATIVE
Glucose, UA: >=1000 mg/dL — AB
Hgb urine dipstick: NEGATIVE
Ketones, ur: NEGATIVE mg/dL
Leukocytes,Ua: NEGATIVE
Nitrite: NEGATIVE
Protein, ur: NEGATIVE mg/dL
Specific Gravity, Urine: 1.015 (ref 1.005–1.030)
Urobilinogen, UA: 0.2 mg/dL (ref 0.0–1.0)
pH: 5.5 (ref 5.0–8.0)

## 2022-03-20 LAB — CBG MONITORING, ED: Glucose-Capillary: 111 mg/dL — ABNORMAL HIGH (ref 70–99)

## 2022-03-20 MED ORDER — ONDANSETRON 4 MG PO TBDP
4.0000 mg | ORAL_TABLET | Freq: Once | ORAL | Status: AC
  Administered 2022-03-20: 4 mg via ORAL

## 2022-03-20 MED ORDER — ONDANSETRON 4 MG PO TBDP
ORAL_TABLET | ORAL | Status: AC
  Filled 2022-03-20: qty 1

## 2022-03-20 MED ORDER — HYDROCODONE-ACETAMINOPHEN 5-325 MG PO TABS: 1.0000 | ORAL_TABLET | Freq: Four times a day (QID) | ORAL | 0 refills | Status: DC | PRN

## 2022-03-20 NOTE — Discharge Instructions (Addendum)
Heating pad use on a 20-minute on-20 minutes off cycle As pain recedes, begin normal activities slowly as tolerated. Gentle stretching exercises Return to urgent care if symptoms worsen

## 2022-03-20 NOTE — Chronic Care Management (AMB) (Signed)
Chronic Care Management   CCM RN Visit Note  03/20/2022 Name: [name] MRN: [id number] DOB: [birth year]  Subjective: [name] is a 71 y.o. year old male who is a primary care patient of Dalton Morale, MD. The care management team was consulted for assistance with disease management and care coordination needs.    Engaged with patient by telephone for  RNCM case closed goals met  in response to provider referral for case management and/or care coordination services.   Consent to Services:  The patient was given information about Chronic Care Management services, agreed to services, and gave verbal consent prior to initiation of services.  Please see initial visit note for detailed documentation.   Patient agreed to services and verbal consent obtained.   Assessment: Review of patient past medical history, allergies, medications, health status, including review of consultants reports, laboratory and other test data, was performed as part of comprehensive evaluation and provision of chronic care management services.   SDOH (Social Determinants of Health) assessments and interventions performed:    CCM Care Plan  Allergies  Allergen Reactions   Codeine Other (See Comments)    hallucinations   Lisinopril Cough    Outpatient Encounter Medications as of 03/20/2022  Medication Sig   acetaminophen (TYLENOL) 500 MG tablet Take 1,000 mg by mouth every 6 (six) hours as needed for mild pain.   aspirin 81 MG chewable tablet Chew 1 tablet (81 mg total) by mouth daily.   BRILINTA 90 MG TABS tablet TAKE ONE TABLET BY MOUTH EVERY MORNING and TAKE ONE TABLET BY MOUTH EVERY EVENING   carvedilol (COREG) 3.125 MG tablet TAKE ONE TABLET BY MOUTH EVERY MORNING and TAKE ONE TABLET BY MOUTH EVERY EVENING   Cholecalciferol (VITAMIN D) 50 MCG (2000 UT) CAPS Take 1 capsule by mouth daily.   diclofenac Sodium (VOLTAREN) 1 % GEL Apply 2 g topically 2 (two) times daily as needed (pain).    DULoxetine (CYMBALTA) 20 MG capsule Take 1 capsule (20 mg total) by mouth daily.   FARXIGA 10 MG TABS tablet TAKE ONE TABLET BY MOUTH BEFORE BREAKFAST   finasteride ([name]) 5 MG tablet Take 1 tablet (5 mg total) by mouth daily.   ketoconazole (NIZORAL) 2 % cream Apply 1 application topically daily as needed for irritation.   LORazepam (ATIVAN) 2 MG tablet Take 1 tablet (2 mg total) by mouth every 6 (six) hours as needed for anxiety.   melatonin 3 MG TABS tablet Take 1 tablet by mouth daily.   nitroGLYCERIN (NITROSTAT) 0.4 MG SL tablet Place 1 tablet (0.4 mg total) under the tongue every 5 (five) minutes as needed for chest pain.   omeprazole (PRILOSEC) 20 MG capsule Take 1 capsule (20 mg total) by mouth as needed.   rosuvastatin (CRESTOR) 10 MG tablet Take 1 tablet (10 mg total) by mouth daily.   sacubitril-valsartan (ENTRESTO) 24-26 MG Take 1 tablet by mouth 2 (two) times daily.   sildenafil (REVATIO) 20 MG tablet Take 20 mg by mouth as needed.   triamcinolone cream (KENALOG) 0.1 % Apply 1 application topically 2 (two) times daily as needed (psoriasis).   [DISCONTINUED] tadalafil (CIALIS) 20 MG tablet Take 20 mg by mouth daily as needed.     No facility-administered encounter medications on file as of 03/20/2022.    Patient Active Problem List   Diagnosis Date Noted   CAD, multiple vessel 09/03/2021   ST elevation myocardial infarction (STEMI) ([place]) 09/03/2021   Type 2 diabetes mellitus with diabetic  chronic kidney disease ([place]) 05/30/2021   CKD (chronic kidney disease), stage III ([place]) 05/30/2021   Hypertension    Hemoptysis    Acute hypoxemic respiratory failure (HCC)    Acute renal failure (HCC)    Encephalopathy acute    Cardiogenic shock ([place]) 01/03/2021   Atypical chest pain 10/25/2019   Coronary artery calcification seen on CAT scan 06/15/2019   Nodule of lower lobe of left lung 06/15/2019   Tremor of right hand 04/24/2017   Numbness and tingling of both legs 04/24/2017    Bilateral leg edema 06/05/2015   Chronic neck pain 06/05/2015   Plantar fasciitis 02/01/2014   Visual floaters 04/04/2013   Subjective vision disturbance, left 04/04/2013   BPH with urinary obstruction 01/01/2010   MELANOMA, HX OF 06/01/2009   NECK PAIN 09/11/2008   CELLULITIS AND ABSCESS OF LEG EXCEPT FOOT 04/26/2008   ERECTILE DYSFUNCTION 10/12/2007   Hyperlipemia, mixed 04/12/2007   Anxiety state 04/12/2007   Essential hypertension 04/12/2007   GERD 04/12/2007    Conditions to be addressed/monitored:CHF and CAD  Care Plan : RN Care Manager Plan of Care  Updates made by Dimitri Ped, RN since 03/20/2022 12:00 AM  Completed 03/20/2022   Problem: Chronic Disease Management and Care Coordination Needs(HF, CAD, HTN, HLD) Resolved 03/20/2022  Priority: High     Long-Range Goal: Establish Plan of Care for Chronic Disease Management Needs (HF, CAD, HTN, HLD) Completed 03/20/2022  Start Date: 08/30/2021  Expected End Date: 02/21/2023  Priority: High  Note:   RNCM case closed goals met Current Barriers:  Chronic Disease Management support and education needs related to CHF, CAD, HTN, and HLD    States he has been feeling good  States he  exercising and walking daily.  States he now [facility] [employer] to [facility] 3 times a week. Denies any chest pains, swelling or shortness of breath. States he is weighing, checking B/P and O2 sat daily.  States his weight has been stable between 173-175.  B/P was 122/65 today.  States he is keeping a log of his readings  States he has been following a low sodium diet and trying to eat more vegetables and fruits. States he follows his  fluid restriction.  States he  saw the kidney doctor  in May.  States hs anxiety and sleep is much better since he started taking the Cymbalta.  RNCM Clinical Goal(s):  Patient will verbalize understanding of plan for management of CHF, CAD, HTN, and HLD as evidenced by voiced adherence to plan of care verbalize basic  understanding of  CHF, CAD, HTN, and HLD disease process and self health management plan as evidenced by voiced understanding and teach back take all medications exactly as prescribed and will call provider for medication related questions as evidenced by dispense report and pt verbalization attend all scheduled medical appointments: CCM Pharm D 06/04/22, Dr. Sarajane Jews 02/21/22 Dr. Haroldine Laws October,  nephrology August 2023  as evidenced by medical records demonstrate Ongoing adherence to prescribed treatment plan for CHF, CAD, HTN, and HLD as evidenced by readings within limits, voiced adherence to plan of care continue to work with RN Care Manager to address care management and care coordination needs related to  CHF, CAD, HTN, and HLD as evidenced by adherence to CM Team Scheduled appointments through collaboration with RN Care manager, provider, and care team.   Interventions: 1:1 collaboration with primary care provider regarding development and update of comprehensive plan of care as evidenced by provider  attestation and co-signature Inter-disciplinary care team collaboration (see longitudinal plan of care) Evaluation of current treatment plan related to  self management and patient's adherence to plan as established by provider    CAD Interventions: (Status:  Goal Met.) Long Term Goal Assessed understanding of CAD diagnosis Medications reviewed including medications utilized in CAD treatment plan Provided education on importance of blood pressure control in management of CAD Counseled on importance of regular laboratory monitoring as prescribed Counseled on the importance of exercise goals with target of 150 minutes per week Reviewed Importance of taking all medications as prescribed Reviewed Importance of attending all scheduled provider appointments Advised to report any changes in symptoms or exercise tolerance Reviewed to continue his exercise and continue to go to the gym to  exercise   Heart Failure Interventions:  (Status:  Goal Met.) Long Term Goal Provided education on low sodium diet Reviewed Heart Failure Action Plan in depth and provided written copy Discussed importance of daily weight and advised patient to weigh and record daily Reviewed role of diuretics in prevention of fluid overload and management of heart failure; Discussed the importance of keeping all appointments with provider Provided patient with education about the role of exercise in the management of heart failure Reviewed s/sx of HF to call MD  Hyperlipidemia Interventions:  (Status:  Goal Met.) Long Term Goal Medication review performed; medication list updated in electronic medical record.  Provider established cholesterol goals reviewed Counseled on importance of regular laboratory monitoring as prescribed Reviewed role and benefits of statin for ASCVD risk reduction Reviewed importance of limiting foods high in cholesterol Reviewed exercise goals and target of 150 minutes per week  Hypertension Interventions:  (Status:  Goal Met.) Long Term Goal Last practice recorded BP readings:  BP Readings from Last 3 Encounters:  02/17/22 (!) 145/70  12/31/21 112/68  12/20/21 110/62  Most recent eGFR/CrCl: No results found for: EGFR  No components found for: CRCL  Evaluation of current treatment plan related to hypertension self management and patient's adherence to plan as established by provider Provided education to patient re: stroke prevention, s/s of heart attack and stroke Counseled on the importance of exercise goals with target of 150 minutes per week Discussed plans with patient for ongoing care management follow up and provided patient with direct contact information for care management team Advised patient, providing education and rationale, to monitor blood pressure daily and record, calling PCP for findings outside established parameters Provided education on prescribed diet  low sodium heart healthy Reviewed to notify doctor if his B/P increases and stays elevated  Patient Goals/Self-Care Activities: Take all medications as prescribed Attend all scheduled provider appointments Call provider office for new concerns or questions  call office if I gain more than 2 pounds in one day or 5 pounds in one week keep legs up while sitting track weight in diary watch for swelling in feet, ankles and legs every day weigh myself daily follow rescue plan if symptoms flare-up eat more whole grains, fruits and vegetables, lean meats and healthy fats track symptoms and what helps feel better or worse dress right for the weather, hot or cold check blood pressure daily choose a place to take my blood pressure (home, clinic or office, retail store) take blood pressure log to all doctor appointments begin an exercise program eat more whole grains, fruits and vegetables, lean meats and healthy fats limit salt intake to 1523m/day call for medicine refill 2 or 3 days before it runs out take  all medications exactly as prescribed call doctor with any symptoms you believe are related to your medicine  Follow Up Plan:  The patient has been provided with contact information for the care management team and has been advised to call with any health related questions or concerns.  No further follow up required: RNCM case closed goals met       Plan:The patient has been provided with contact information for the care management team and has been advised to call with any health related questions or concerns.  No further follow up required: RNCM case closed goals met Peter Garter RN, [facility], CDE Care Management Coordinator [facility] [phone]

## 2022-03-20 NOTE — ED Provider Notes (Signed)
[place]    CSN: [id number] Arrival date & time: 03/20/22  1719      History   Chief Complaint Chief Complaint  Patient presents with   Back Pain    HPI [name] is a 71 y.o. male comes to the urgent care with 1 day history of left-sided lower back pain.  Patient was tending to his plants when he started experiencing lower back pain.  Pain is sharp, constant and aggravated by movement and palpation.  Patient took Robaxin with no relief.  He denies radiation of pain into the lower extremities.  Patient denies a fall or heavy lifting.  No numbness tingling or weakness in the lower extremities.  No urinary or bowel difficulties.   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   CKD (chronic kidney disease), stage III ([place]) 05/30/2021   Hypertension    Hemoptysis    Acute hypoxemic respiratory failure (HCC)    Acute renal failure (HCC)    Encephalopathy acute    Cardiogenic shock ([place]) 01/03/2021   Atypical chest pain 10/25/2019   Coronary artery calcification seen on CAT scan 06/15/2019   Nodule of lower lobe of left lung 06/15/2019   Tremor of right hand 04/24/2017   Numbness and tingling of both legs 04/24/2017   Bilateral leg edema 06/05/2015   Chronic neck pain 06/05/2015   Plantar  fasciitis 02/01/2014   Visual floaters 04/04/2013   Subjective vision disturbance, left 04/04/2013   BPH with urinary obstruction 01/01/2010   MELANOMA, HX OF 06/01/2009   NECK PAIN 09/11/2008   CELLULITIS AND ABSCESS OF LEG EXCEPT FOOT 04/26/2008   ERECTILE DYSFUNCTION 10/12/2007   Hyperlipemia, mixed 04/12/2007   Anxiety state 04/12/2007   Essential hypertension 04/12/2007   GERD 04/12/2007    Past Surgical History:  Procedure Laterality Date   BASAL CELL CARCINOMA EXCISION     removed from scalp   COLONOSCOPY  07/03/2017   per Dr. Havery Moros, sessile serrated polyp, repeat in 5 yrs    [place] CATH AND CORONARY ANGIOGRAPHY N/A 01/03/2021   Procedure: LEFT HEART CATH AND CORONARY ANGIOGRAPHY;  Surgeon: Troy Sine, MD;  Location: [place] [facility];  Service: Cardiovascular;  Laterality: N/A;   RIGHT HEART CATH N/A 01/03/2021   Procedure: RIGHT HEART CATH;  Surgeon: Jolaine Artist, MD;  Location: [facility];  Service: Cardiovascular;  Laterality: N/A;       Home Medications    Prior to Admission medications   Medication Sig Start Date End Date Taking? Authorizing Provider  aspirin 81 MG chewable tablet Chew 1 tablet (81 mg  total) by mouth daily. 03/21/21  Yes [name], MD  BRILINTA 90 MG TABS tablet TAKE ONE TABLET BY MOUTH EVERY MORNING and TAKE ONE TABLET BY MOUTH EVERY EVENING 03/07/22  Yes [name], MD  carvedilol (COREG) 3.125 MG tablet TAKE ONE TABLET BY MOUTH EVERY MORNING and TAKE ONE TABLET BY MOUTH EVERY EVENING 03/07/22  Yes [name], MD  Cholecalciferol (VITAMIN D) 50 MCG (2000 UT) CAPS Take 1 capsule by mouth daily.   Yes [provider]  diclofenac Sodium (VOLTAREN) 1 % GEL Apply 2 g topically 2 (two) times daily as needed (pain).   Yes [provider]  DULoxetine (CYMBALTA) 20 MG capsule Take 1 capsule (20 mg total) by mouth daily. 12/20/21  Yes [name], MD  FARXIGA 10 MG TABS tablet  TAKE ONE TABLET BY MOUTH BEFORE BREAKFAST 02/06/22  Yes [place], Maricela Bo, FNP  finasteride ([name]) 5 MG tablet Take 1 tablet (5 mg total) by mouth daily. 03/21/21  Yes [name], MD  HYDROcodone-acetaminophen (NORCO/VICODIN) 5-325 MG tablet Take 1 tablet by mouth every 6 (six) hours as needed for severe pain or moderate pain. 03/20/22  Yes Raymel Cull, Myrene Galas, MD  LORazepam (ATIVAN) 2 MG tablet Take 1 tablet (2 mg total) by mouth every 6 (six) hours as needed for anxiety. 02/21/22  Yes [name], MD  melatonin 3 MG TABS tablet Take 1 tablet by mouth daily.   Yes [provider]  omeprazole (PRILOSEC) 20 MG capsule Take 1 capsule (20 mg total) by mouth as needed. 05/30/21  Yes [name], MD  rosuvastatin (CRESTOR) 10 MG tablet Take 1 tablet (10 mg total) by mouth daily. 05/30/21  Yes [name], MD  sacubitril-valsartan (ENTRESTO) 24-26 MG Take 1 tablet by mouth 2 (two) times daily. 04/16/21  Yes [place], Maricela Bo, FNP  triamcinolone cream (KENALOG) 0.1 % Apply 1 application topically 2 (two) times daily as needed (psoriasis).   Yes [provider]  ketoconazole (NIZORAL) 2 % cream Apply 1 application topically daily as needed for irritation.    [provider]  nitroGLYCERIN (NITROSTAT) 0.4 MG SL tablet Place 1 tablet (0.4 mg total) under the tongue every 5 (five) minutes as needed for chest pain. 02/14/21   [name], MD  sildenafil (REVATIO) 20 MG tablet Take 20 mg by mouth as needed.    [provider]  tadalafil (CIALIS) 20 MG tablet Take 20 mg by mouth daily as needed.    12/17/11  [provider]    Family History Family History  Problem Relation Age of Onset   Hypertension Mother    Hyperlipidemia Mother    Depression Other    Diabetes Other    Hyperlipidemia Other    Hypertension Other    Alzheimer's disease Other    Colon cancer Neg Hx     Social History Social History   Tobacco Use   Smoking status: Never    Smokeless tobacco: Never  Vaping Use   Vaping Use: Never used  Substance Use Topics   Alcohol use: No    Alcohol/week: 0.0 standard drinks of alcohol   Drug use: No     Allergies   Codeine and Lisinopril   Review of Systems Review of Systems  Musculoskeletal:  Positive for back pain. Negative for gait problem, joint swelling, neck pain and neck stiffness.  Skin: Negative.   Neurological:  Negative for weakness.     Physical Exam Triage  Vital Signs ED Triage Vitals  Enc Vitals Group     BP 03/20/22 1823 (!) 172/90     Pulse Rate 03/20/22 1823 61     Resp 03/20/22 1823 20     Temp 03/20/22 1823 97.8 F (36.6 C)     Temp src --      SpO2 03/20/22 1823 98 %     Weight --      Height --      Head Circumference --      Peak Flow --      Pain Score 03/20/22 1824 7     Pain Loc --      Pain Edu? --      Excl. in [place]? --    No data found.  Updated Vital Signs BP (!) 172/90   Pulse 61   Temp 97.8 F (36.6 C)   Resp 20   SpO2 98%   Visual Acuity Right Eye Distance:   Left Eye Distance:   Bilateral Distance:    Right Eye Near:   Left Eye Near:    Bilateral Near:     Physical Exam Vitals and nursing note reviewed.  Constitutional:      General: He is not in acute distress.    Appearance: He is not ill-appearing.  Cardiovascular:     Rate and Rhythm: Normal rate and regular rhythm.  Musculoskeletal:     Comments: Tenderness on palpation over the left sacroiliac joint.  Full range of motion of the thoracolumbar spine.  No flexion/extension limitations.  Power is 5/5 in lower extremities.  Neurological:     Mental Status: He is alert.      UC Treatments / Results  Labs (all labs ordered are listed, but only abnormal results are displayed) Labs Reviewed  POCT URINALYSIS DIPSTICK, ED / UC - Abnormal; Notable for the following components:      Result Value   Glucose, UA >=1000 (*)    All other components within normal limits  CBG MONITORING, ED -  Abnormal; Notable for the following components:   Glucose-Capillary 111 (*)    All other components within normal limits    EKG   Radiology No results found.  Procedures Procedures (including critical care time)  Medications Ordered in UC Medications  ondansetron (ZOFRAN-ODT) disintegrating tablet 4 mg (4 mg Oral Given 03/20/22 1831)    Initial Impression / Assessment and Plan / UC Course  I have reviewed the triage vital signs and the nursing notes.  Pertinent labs & imaging results that were available during my care of the patient were reviewed by me and considered in my medical decision making (see chart for details).     1.  Left sacroiliac joint sprain: Heating pad use only 20 minutes on-20 minutes off cycle Take medications as prescribed Please use muscle relaxants as needed-do not drive or operate heavy machinery after taking muscle relaxant. Gentle stretching exercises Back strengthening exercises Return precautions given Final Clinical Impressions(s) / UC Diagnoses   Final diagnoses:  Sacroiliac pain     Discharge Instructions      Heating pad use on a 20-minute on-20 minutes off cycle As pain recedes, begin normal activities slowly as tolerated. Gentle stretching exercises Return to urgent care if symptoms worsen   ED Prescriptions     Medication Sig Dispense Auth. Provider   HYDROcodone-acetaminophen (NORCO/VICODIN) 5-325 MG tablet Take 1 tablet by mouth every 6 (six) hours as needed for severe pain or moderate pain. 10 tablet  Miaisabella Bacorn, Myrene Galas, MD      I have reviewed the PDMP during this encounter.   Chase Picket, MD 03/20/22 [phone]

## 2022-03-20 NOTE — ED Triage Notes (Signed)
Pt reports sudden onset constant severe left low back pain while standing outside this AM. States had improved some, but now progressing some again. Has been feeling hot, having nausea, and had elevated BP at home. Pain unchanged with movement. Tried muscle relaxer @ approx 1200 without relief. Denies any urinary sxs.

## 2022-03-20 NOTE — Patient Instructions (Signed)
Visit Information RNCM case closed goals met Thank you for allowing me to share the care management and care coordination services that are available to you as part of your health plan and services through your primary care provider and medical home. Please reach out to me at [phone] if the care management/care coordination team may be of assistance to you in the future.   Peter Garter RN, Jackquline Denmark, CDE Care Management Coordinator [facility] [phone]

## 2022-03-21 DIAGNOSIS — I110 Hypertensive heart disease with heart failure: Secondary | ICD-10-CM | POA: Diagnosis not present

## 2022-03-21 DIAGNOSIS — E785 Hyperlipidemia, unspecified: Secondary | ICD-10-CM

## 2022-03-21 DIAGNOSIS — I2510 Atherosclerotic heart disease of native coronary artery without angina pectoris: Secondary | ICD-10-CM

## 2022-03-21 DIAGNOSIS — I509 Heart failure, unspecified: Secondary | ICD-10-CM | POA: Diagnosis not present

## 2022-04-08 ENCOUNTER — Telehealth: Payer: Self-pay | Admitting: Pharmacist

## 2022-04-08 NOTE — Chronic Care Management (AMB) (Signed)
Chronic Care Management Pharmacy Assistant   Name: [name]  MRN: [id number] DOB: 12/01/1950  Reason for Encounter: Disease State and Medication Review / Hypertension and Medication Coordination Call  Recent office visits:  None  Recent consult visits:  None  Hospital visits:  Patient was seen at [facility] on 03/20/2022 due to Sacroiliac pain.    New?Medications Started at [facility] Discharge:?? HYDROcodone-acetaminophen 5-325 MG Take 1 tablet by mouth every 6 (six) hours as needed for severe pain or moderate pain. Medication Changes at Hospital Discharge: No medication changes Medications Discontinued at Hospital Discharge: No medications discontinued Medications that remain the same after Hospital Discharge:??  -All other medications will remain the same.    Medications: Outpatient Encounter Medications as of 04/08/2022  Medication Sig Note   aspirin 81 MG chewable tablet Chew 1 tablet (81 mg total) by mouth daily.    BRILINTA 90 MG TABS tablet TAKE ONE TABLET BY MOUTH EVERY MORNING and TAKE ONE TABLET BY MOUTH EVERY EVENING    carvedilol (COREG) 3.125 MG tablet TAKE ONE TABLET BY MOUTH EVERY MORNING and TAKE ONE TABLET BY MOUTH EVERY EVENING    Cholecalciferol (VITAMIN D) 50 MCG (2000 UT) CAPS Take 1 capsule by mouth daily.    diclofenac Sodium (VOLTAREN) 1 % GEL Apply 2 g topically 2 (two) times daily as needed (pain).    DULoxetine (CYMBALTA) 20 MG capsule Take 1 capsule (20 mg total) by mouth daily.    FARXIGA 10 MG TABS tablet TAKE ONE TABLET BY MOUTH BEFORE BREAKFAST    finasteride ([name]) 5 MG tablet Take 1 tablet (5 mg total) by mouth daily.    HYDROcodone-acetaminophen (NORCO/VICODIN) 5-325 MG tablet Take 1 tablet by mouth every 6 (six) hours as needed for severe pain or moderate pain.    ketoconazole (NIZORAL) 2 % cream Apply 1 application topically daily as needed for irritation. 03/20/2022: prn   LORazepam (ATIVAN) 2 MG tablet Take 1 tablet  (2 mg total) by mouth every 6 (six) hours as needed for anxiety.    melatonin 3 MG TABS tablet Take 1 tablet by mouth daily.    nitroGLYCERIN (NITROSTAT) 0.4 MG SL tablet Place 1 tablet (0.4 mg total) under the tongue every 5 (five) minutes as needed for chest pain.    omeprazole (PRILOSEC) 20 MG capsule Take 1 capsule (20 mg total) by mouth as needed. 03/20/2022: prn   rosuvastatin (CRESTOR) 10 MG tablet Take 1 tablet (10 mg total) by mouth daily.    sacubitril-valsartan (ENTRESTO) 24-26 MG Take 1 tablet by mouth 2 (two) times daily.    sildenafil (REVATIO) 20 MG tablet Take 20 mg by mouth as needed.    triamcinolone cream (KENALOG) 0.1 % Apply 1 application topically 2 (two) times daily as needed (psoriasis).    [DISCONTINUED] tadalafil (CIALIS) 20 MG tablet Take 20 mg by mouth daily as needed.      No facility-administered encounter medications on file as of 04/08/2022.  Reviewed chart for medication changes ahead of medication coordination call.  No OVs, Consults, or hospital visits since last care coordination call/Pharmacist visit. (If appropriate, list visit date, provider name)  No medication changes indicated OR if recent visit, treatment plan here.  BP Readings from Last 3 Encounters:  03/20/22 (!) 172/90  02/21/22 126/70  02/17/22 (!) 145/70    Lab Results  Component Value Date   HGBA1C 5.3 02/21/2022   Reviewed chart prior to disease state call. Spoke with patient regarding BP  Recent Office Vitals: BP Readings from Last 3 Encounters:  03/20/22 (!) 172/90  02/21/22 126/70  02/17/22 (!) 145/70   Pulse Readings from Last 3 Encounters:  03/20/22 61  02/21/22 (!) 59  02/17/22 64    Wt Readings from Last 3 Encounters:  02/21/22 177 lb 9.6 oz (80.6 kg)  12/31/21 181 lb 3.2 oz (82.2 kg)  12/20/21 172 lb 8 oz (78.2 kg)     Kidney Function Lab Results  Component Value Date/Time   CREATININE 1.57 (H) 12/31/2021 12:32 PM   CREATININE 1.82 (H) 08/13/2021 02:54 PM    CREATININE 1.12 05/24/2020 08:29 AM   GFR 29.72 (L) 05/30/2021 08:42 AM   GFRNONAA 47 (L) 12/31/2021 12:32 PM   GFRAA 81 10/25/2019 02:55 PM       Latest Ref Rng & Units 12/31/2021   12:32 PM 08/13/2021    2:54 PM 06/18/2021    2:47 PM  BMP  Glucose 70 - 99 mg/dL 97  101  112   BUN 8 - 23 mg/dL '31  28  31   '$ Creatinine 0.61 - 1.24 mg/dL 1.57  1.82  2.11   Sodium 135 - 145 mmol/L 135  137  137   Potassium 3.5 - 5.1 mmol/L 4.2  4.4  5.2   Chloride 98 - 111 mmol/L 105  104  105   CO2 22 - 32 mmol/L '23  26  25   '$ Calcium 8.9 - 10.3 mg/dL 9.0  8.8  9.0     Current antihypertensive regimen:  Carvedilol 3.125 mg 1 tablet twice daily Entresto 24-26 mg 1 tablet twice daily  How often are you checking your Blood Pressure? Patient checks his blood pressures daily in the morning.   Current home BP readings: 04/09/22 - 124/72                                                    04/08/22 - 118/65                                                    04/07/22 - 121/62                                                    04/06/22 - 108/75                                                     04/05/22 - 110/61  What recent interventions/DTPs have been made by any provider to improve Blood Pressure control since last CPP Visit: No recent interventions  Any recent hospitalizations or ED visits since last visit with CPP? Patient was seen at [facility] on 03/20/2022 due to Sacroiliac pain.  What diet changes have been made to improve Blood Pressure Control?  Patient follows a low fat, low sodium diet and he doesn't eat fast food Breakfast - patient will  have cereal Lunch - patient will have salads with fruits and nuts Dinner - patient will have a meat, vegetable and fruit  What exercise is being done to improve your Blood Pressure Control?  Patient is walking daily and doing weights, stretches and balance exercises daily.   Adherence Review: Is the patient currently on ACE/ARB medication?  No Does the patient have >5 day gap between last estimated fill dates? No    Patient obtains medications through Adherence Packaging  30 Days    Last adherence pickup included:  Ticagrelor (BRILINTA) 90 mg: 1 tablet at breakfast and 1 tablet at dinner Farxiga 10 mg: one tablet before breakfast Rosuvastatin  10 mg: one tablet at breakfast Finasteride  5 mg: one tablet before breakfast Carvedilol  3.125 mg: 1 tablet at breakfast and 1 tablet at dinner Omeprazole 20 mg  - one tablet as needed (Vial)   Patient declined (meds) last month:  Entresto patient has plenty on hand (not due til 05/06/22)        Next adherence delivery scheduled on: 04/21/2022   Called patient and reviewed medications and coordinated delivery.   This delivery to include: Farxiga 10 mg: one tablet before breakfast Rosuvastatin 10 mg: one tablet at breakfast Finasteride  5 mg: one tablet before breakfast Carvedilol  3.125 mg: 1 tablet at breakfast and 1 tablet at dinner Brilinta 90 mg: 1 tablet at breakfast and 1 tablet at dinner  Patient will need a short fill: Patient declines short fills   Coordinated acute fill: Patient declines acute fills   Patient declined the following medications:   Entresto patient has plenty on hand (not due til 05/06/22) Omeprazole 20 mg  - one tablet as needed (Vial)  Patient is requesting delivery date to be on 04/21/2022, advised patient that pharmacy will contact them the morning of delivery.   Care Gaps: AWV - completed 12/09/2021 Last BP - 172/90 on 03/20/2022 Last A1C - 5.3 on 02/21/2022 Foot exam - never done Shingrix - never done Covid booster - overdue  Star Rating Drugs: Farxiga 10 mg - last filled 03/13/2022 30 DS at [facility] Rosuvastatin 10 mg - last filled 03/13/2022 30 DS at [place] [facility] [phone]

## 2022-04-11 ENCOUNTER — Other Ambulatory Visit (HOSPITAL_BASED_OUTPATIENT_CLINIC_OR_DEPARTMENT_OTHER): Payer: Self-pay

## 2022-04-24 ENCOUNTER — Other Ambulatory Visit (HOSPITAL_COMMUNITY): Payer: Self-pay | Admitting: Family Medicine

## 2022-05-07 ENCOUNTER — Telehealth: Payer: Self-pay | Admitting: Pharmacist

## 2022-05-07 NOTE — Chronic Care Management (AMB) (Signed)
Chronic Care Management Pharmacy Assistant   Name: [name]  MRN: [id number] DOB: 1951/05/07  Reason for Encounter: Medication Review / Medication Coordination Call   Recent office visits:  None  Recent consult visits:  none  Hospital visits:  None  Medications: Outpatient Encounter Medications as of 05/07/2022  Medication Sig Note   aspirin 81 MG chewable tablet Chew 1 tablet (81 mg total) by mouth daily.    BRILINTA 90 MG TABS tablet TAKE ONE TABLET BY MOUTH EVERY MORNING and TAKE ONE TABLET BY MOUTH EVERY EVENING    carvedilol (COREG) 3.125 MG tablet TAKE ONE TABLET BY MOUTH EVERY MORNING and TAKE ONE TABLET BY MOUTH EVERY EVENING    Cholecalciferol (VITAMIN D) 50 MCG (2000 UT) CAPS Take 1 capsule by mouth daily.    diclofenac Sodium (VOLTAREN) 1 % GEL Apply 2 g topically 2 (two) times daily as needed (pain).    DULoxetine (CYMBALTA) 20 MG capsule Take 1 capsule (20 mg total) by mouth daily.    ENTRESTO 24-26 MG TAKE ONE TABLET BY MOUTH twice daily    FARXIGA 10 MG TABS tablet TAKE ONE TABLET BY MOUTH BEFORE BREAKFAST    finasteride ([name]) 5 MG tablet Take 1 tablet (5 mg total) by mouth daily.    HYDROcodone-acetaminophen (NORCO/VICODIN) 5-325 MG tablet Take 1 tablet by mouth every 6 (six) hours as needed for severe pain or moderate pain.    ketoconazole (NIZORAL) 2 % cream Apply 1 application topically daily as needed for irritation. 03/20/2022: prn   LORazepam (ATIVAN) 2 MG tablet Take 1 tablet (2 mg total) by mouth every 6 (six) hours as needed for anxiety.    melatonin 3 MG TABS tablet Take 1 tablet by mouth daily.    nitroGLYCERIN (NITROSTAT) 0.4 MG SL tablet Place 1 tablet (0.4 mg total) under the tongue every 5 (five) minutes as needed for chest pain.    omeprazole (PRILOSEC) 20 MG capsule Take 1 capsule (20 mg total) by mouth as needed. 03/20/2022: prn   rosuvastatin (CRESTOR) 10 MG tablet Take 1 tablet (10 mg total) by mouth daily.    sildenafil (REVATIO) 20  MG tablet Take 20 mg by mouth as needed.    triamcinolone cream (KENALOG) 0.1 % Apply 1 application topically 2 (two) times daily as needed (psoriasis).    Zoster Vaccine Adjuvanted [facility]) injection Inject into the muscle.    [DISCONTINUED] tadalafil (CIALIS) 20 MG tablet Take 20 mg by mouth daily as needed.      No facility-administered encounter medications on file as of 05/07/2022.   Reviewed chart for medication changes ahead of medication coordination call.  No OVs, Consults, or hospital visits since last care coordination call/Pharmacist visit. (If appropriate, list visit date, provider name)  No medication changes indicated OR if recent visit, treatment plan here.  BP Readings from Last 3 Encounters:  03/20/22 (!) 172/90  02/21/22 126/70  02/17/22 (!) 145/70    Lab Results  Component Value Date   HGBA1C 5.3 02/21/2022     Patient obtains medications through Adherence Packaging  30 Days    Last adherence pickup included:  Farxiga 10 mg: one tablet before breakfast Rosuvastatin 10 mg: one tablet at breakfast Finasteride  5 mg: one tablet before breakfast Carvedilol  3.125 mg: 1 tablet at breakfast and 1 tablet at dinner Brilinta 90 mg: 1 tablet at breakfast and 1 tablet at dinner   Patient declined (meds) last month:  Entresto patient has plenty on hand (not due  til 05/06/22) Omeprazole 20 mg  - one tablet as needed (Vial)        Next adherence delivery scheduled on: 05/20/2022   Called patient and reviewed medications and coordinated delivery.   This delivery to include: Farxiga 10 mg: one tablet before breakfast Rosuvastatin 10 mg: one tablet at breakfast Finasteride  5 mg: one tablet before breakfast Carvedilol  3.125 mg: 1 tablet at breakfast and 1 tablet at dinner Brilinta 90 mg: 1 tablet at breakfast and 1 tablet at dinner Omeprazole 20 mg  - 1 tablet daily as needed   Entresto 24/26 mg - 1 tablet twice daily (90 DS) Deliver on 05/14/2022   Patient  will need a short fill: Patient declines short fills   Coordinated acute fill: Patient declines acute fills   Patient declined the following medications:     Confirmed delivery date of 05/21/2022 for all medications except [name] he will need this delivered on 05/14/2022, advised patient that pharmacy will contact them the morning of delivery.  Care Gaps: AWV - completed 12/09/2021 Last BP - 172/90 on 03/20/2022 Last A1C - 5.3 on 02/21/2022 Foot exam - never done Covid booster - overdue Flu - due  Star Rating Drugs: Farxiga 10 mg - last filled 04/14/2022 30 DS at [facility] Rosuvastatin 10 mg - last filled 04/14/2022 30 DS at [facility] [phone]

## 2022-05-15 ENCOUNTER — Ambulatory Visit: Payer: Medicare PPO | Admitting: Family Medicine

## 2022-05-15 ENCOUNTER — Encounter: Payer: Self-pay | Admitting: Family Medicine

## 2022-05-15 ENCOUNTER — Ambulatory Visit (INDEPENDENT_AMBULATORY_CARE_PROVIDER_SITE_OTHER): Payer: Medicare PPO

## 2022-05-15 VITALS — BP 108/68 | HR 87 | Temp 98.5°F | Wt 176.0 lb

## 2022-05-15 DIAGNOSIS — G8929 Other chronic pain: Secondary | ICD-10-CM | POA: Diagnosis not present

## 2022-05-15 DIAGNOSIS — M5442 Lumbago with sciatica, left side: Secondary | ICD-10-CM | POA: Diagnosis not present

## 2022-05-15 DIAGNOSIS — M545 Low back pain, unspecified: Secondary | ICD-10-CM | POA: Diagnosis not present

## 2022-05-15 NOTE — Progress Notes (Signed)
   Subjective:    Patient ID: Machai Desmith, male    DOB: Oct 23, 1950, 71 y.o.   MRN: [id number]  HPI Here for a low back pain that comes and goes. This is sharp in nature but not severe. No hx of trauma. The pain starts in the left lower back and it sometimes radiates down the back of the left thigh. He also has a numbness that extends from the left lateral hip area to the left groin. He sometimes takes Tylenol for this.    Review of Systems  Constitutional: Negative.   Respiratory: Negative.    Cardiovascular: Negative.   Genitourinary: Negative.   Musculoskeletal:  Positive for back pain.       Objective:   Physical Exam Constitutional:      General: He is not in acute distress.    Appearance: Normal appearance.  Cardiovascular:     Rate and Rhythm: Normal rate and regular rhythm.     Pulses: Normal pulses.     Heart sounds: Normal heart sounds.  Pulmonary:     Effort: Pulmonary effort is normal.     Breath sounds: Normal breath sounds.  Musculoskeletal:     Comments: He is mildly tender in the left lower back. ROM is full. Negative SLR on both sides   Neurological:     Mental Status: He is alert.           Assessment & Plan:  Left sided low back pain. We will get Xrays of the lumbar spine today.  [name] Penna, MD

## 2022-05-28 ENCOUNTER — Ambulatory Visit: Payer: Medicare PPO | Admitting: Family Medicine

## 2022-05-29 DIAGNOSIS — N401 Enlarged prostate with lower urinary tract symptoms: Secondary | ICD-10-CM | POA: Diagnosis not present

## 2022-05-30 NOTE — Addendum Note (Signed)
Addended by: [name] Penna A on: 05/30/2022 12:53 PM   Modules accepted: Orders

## 2022-06-03 ENCOUNTER — Telehealth: Payer: Self-pay | Admitting: Pharmacist

## 2022-06-03 NOTE — Chronic Care Management (AMB) (Signed)
    Chronic Care Management Pharmacy Assistant   Name: [name]  MRN: [id number] DOB: 11/08/1950  06/03/22 APPOINTMENT REMINDER   Patient was reminded to have all medications, supplements and any blood glucose and blood pressure readings available for review with Jeni Salles, Pharm. D, for telephone visit on 06/04/22 at 1.    Care Gaps: Foot Exam - Overdue COVID Booster - Overdue Flu Vaccine - Overdue BP- 108/68 05/15/22 AWV- 3/23 Lab Results  Component Value Date   HGBA1C 5.3 02/21/2022    Star Rating Drug: Farxiga 10 mg - last filled 05/16/2022 [facility] Rosuvastatin 10 mg - last filled 05/16/2022 30 [facility]    Medications: Outpatient Encounter Medications as of 06/03/2022  Medication Sig Note   aspirin 81 MG chewable tablet Chew 1 tablet (81 mg total) by mouth daily.    BRILINTA 90 MG TABS tablet TAKE ONE TABLET BY MOUTH EVERY MORNING and TAKE ONE TABLET BY MOUTH EVERY EVENING    carvedilol (COREG) 3.125 MG tablet TAKE ONE TABLET BY MOUTH EVERY MORNING and TAKE ONE TABLET BY MOUTH EVERY EVENING    Cholecalciferol (VITAMIN D) 50 MCG (2000 UT) CAPS Take 1 capsule by mouth daily.    diclofenac Sodium (VOLTAREN) 1 % GEL Apply 2 g topically 2 (two) times daily as needed (pain).    DULoxetine (CYMBALTA) 20 MG capsule Take 1 capsule (20 mg total) by mouth daily.    ENTRESTO 24-26 MG TAKE ONE TABLET BY MOUTH twice daily    FARXIGA 10 MG TABS tablet TAKE ONE TABLET BY MOUTH BEFORE BREAKFAST    finasteride ([name]) 5 MG tablet Take 1 tablet (5 mg total) by mouth daily.    ketoconazole (NIZORAL) 2 % cream Apply 1 application topically daily as needed for irritation. 03/20/2022: prn   LORazepam (ATIVAN) 2 MG tablet Take 1 tablet (2 mg total) by mouth every 6 (six) hours as needed for anxiety.    melatonin 3 MG TABS tablet Take 1 tablet by mouth daily.    nitroGLYCERIN (NITROSTAT) 0.4 MG SL tablet Place 1 tablet (0.4 mg total) under the tongue every 5 (five)  minutes as needed for chest pain.    omeprazole (PRILOSEC) 20 MG capsule Take 1 capsule (20 mg total) by mouth as needed. 03/20/2022: prn   rosuvastatin (CRESTOR) 10 MG tablet Take 1 tablet (10 mg total) by mouth daily.    sildenafil (REVATIO) 20 MG tablet Take 20 mg by mouth as needed.    triamcinolone cream (KENALOG) 0.1 % Apply 1 application topically 2 (two) times daily as needed (psoriasis).    Zoster Vaccine Adjuvanted [facility]) injection Inject into the muscle.    [DISCONTINUED] tadalafil (CIALIS) 20 MG tablet Take 20 mg by mouth daily as needed.      No facility-administered encounter medications on file as of 06/03/2022.      [place] Clinical Pharmacist Assistant [phone]

## 2022-06-04 ENCOUNTER — Ambulatory Visit (INDEPENDENT_AMBULATORY_CARE_PROVIDER_SITE_OTHER): Payer: Medicare PPO | Admitting: Pharmacist

## 2022-06-04 DIAGNOSIS — I10 Essential (primary) hypertension: Secondary | ICD-10-CM

## 2022-06-04 DIAGNOSIS — E782 Mixed hyperlipidemia: Secondary | ICD-10-CM

## 2022-06-04 NOTE — Progress Notes (Signed)
Chronic Care Management Pharmacy Note  06/04/2022 Name:  [name] MRN:  [id number] DOB:  [birth year]  Summary: BP is at goal < 130/80 per home and office BP readings Pt reports sleep is improved but anxiety has worsened  Recommendations/Changes made from today's visit: -Consider tapering slowly with lorazepam depending on patient preference -Recommended determining coverage for psychiatrists or therapist with insurance plan -Provided [place] contact information [phone])  Plan: BP assessment in 3 months Follow up in 6 months  Subjective: [name] is an 71 y.o. year old male who is a primary patient of [name], MD.  The CCM team was consulted for assistance with disease management and care coordination needs.    Engaged with patient by telephone for follow up visit in response to provider referral for pharmacy case management and/or care coordination services.   Consent to Services:  The patient was given information about Chronic Care Management services, agreed to services, and gave verbal consent prior to initiation of services.  Please see initial visit note for detailed documentation.   Patient Care Team: [name], MD as PCP - General Tat, Eustace Quail, DO as Consulting Physician (Neurology) Viona Gilmore, [facility] as Pharmacist (Pharmacist)  Recent office visits: 05/15/22 [name] Penna MD: Patient presented for low back pain.  Plan for x-rays of spine and referred to PT.  02/21/2021 [name] Penna MD - Patient was seen for anxiety and additional concerns. Discontinued Methocarbamol. No follow up noted.   12/20/2021 [name] Penna MD - Patient was seen for anxiety state. Started Duloxetine 20 mg daily. No follow up noted.  12/09/2021 Glenna Durand LPN - Medicare annual wellness exam  Recent consult visits: 08/13/21 Allena Katz, [place] (cardiology): Patient presented for CHF follow up. Follow up in 4-5 months.  12/31/21 Bensimhon,  Shaune Pascal, MD (Cardiology): Patient presented for CHF follow up. Follow up in 6 months for repeat Echo.   Hospital visits: Patient was seen at [facility] on 03/20/2022 due to Sacroiliac pain.    New?Medications Started at [facility] Discharge:?? HYDROcodone-acetaminophen 5-325 MG Take 1 tablet by mouth every 6 (six) hours as needed for severe pain or moderate pain. Medication Changes at Hospital Discharge: No medication changes Medications Discontinued at Hospital Discharge: No medications discontinued Medications that remain the same after Hospital Discharge:??  -All other medications will remain the same.  Patient was seen at [facility] on 02/17/2022 (1 hour) due to Tick bite with subsequent removal of tick.  New?Medications Started at [facility] Discharge:?? doxycycline 100 MG capsule Take 1 capsule (100 mg total) by mouth 2 (two) times daily for 7 days. Medication Changes at Hospital Discharge: No medication changes Medications Discontinued at Hospital Discharge: No medications discontinued Medications that remain the same after Hospital Discharge:??  -All other medications will remain the same.    Objective:  Lab Results  Component Value Date   CREATININE 1.57 (H) 12/31/2021   BUN 31 (H) 12/31/2021   GFR 29.72 (L) 05/30/2021   GFRNONAA 47 (L) 12/31/2021   GFRAA 81 10/25/2019   NA 135 12/31/2021   K 4.2 12/31/2021   CALCIUM 9.0 12/31/2021   CO2 23 12/31/2021   GLUCOSE 97 12/31/2021    Lab Results  Component Value Date/Time   HGBA1C 5.3 02/21/2022 01:49 PM   HGBA1C 6.0 05/30/2021 08:42 AM   HGBA1C 5.7 (H) 01/06/2021 04:08 AM   GFR 29.72 (L) 05/30/2021 08:42 AM   GFR 64.40 05/17/2019 09:05 AM  Last diabetic Eye exam: No results found for: "HMDIABEYEEXA"  Last diabetic Foot exam: No results found for: "HMDIABFOOTEX"   Lab Results  Component Value Date   CHOL 119 05/30/2021   HDL 45.10 05/30/2021   LDLCALC 56 05/30/2021   LDLDIRECT 141.9  04/06/2013   TRIG 90.0 05/30/2021   CHOLHDL 3 05/30/2021       Latest Ref Rng & Units 05/30/2021    8:42 AM 02/03/2021    3:07 AM 01/31/2021    3:16 AM  Hepatic Function  Total Protein 6.0 - 8.3 g/dL 6.8  6.0  5.7   Albumin 3.5 - 5.2 g/dL 4.2  2.6  2.2   AST 0 - 37 U/L 18  18  26    ALT 0 - 53 U/L 19  45  83   Alk Phosphatase 39 - 117 U/L 38  75  81   Total Bilirubin 0.2 - 1.2 mg/dL 0.5  0.9  0.4   Bilirubin, Direct 0.0 - 0.3 mg/dL 0.1       Lab Results  Component Value Date/Time   TSH 1.99 05/30/2021 08:42 AM   TSH 1.63 05/24/2020 08:29 AM   FREET4 0.89 12/16/2016 02:44 PM       Latest Ref Rng & Units 06/18/2021    2:47 PM 06/09/2021   12:27 PM 05/30/2021    8:42 AM  CBC  WBC 4.0 - 10.5 K/uL 6.4  20.5  6.7   Hemoglobin 13.0 - 17.0 g/dL 12.1  13.3  12.7   Hematocrit 39.0 - 52.0 % 37.6  38.1  37.7   Platelets 150 - 400 K/uL 238  160  150.0     No results found for: "VD25OH"  Clinical ASCVD: Yes  The ASCVD Risk score (Arnett DK, et al., 2019) failed to calculate for the following reasons:   The patient has a prior MI or stroke diagnosis       05/15/2022   11:40 AM 02/21/2022    1:50 PM 12/20/2021    1:14 PM  Depression screen PHQ 2/9  Decreased Interest 0 0 0  Down, Depressed, Hopeless 0 0 0  PHQ - 2 Score 0 0 0  Altered sleeping 0 0 1  Tired, decreased energy 0 0 1  Change in appetite 0 0 0  Feeling bad or failure about yourself  0 0 0  Trouble concentrating 0 0 1  Moving slowly or fidgety/restless 1 0 1  Suicidal thoughts 0 0 0  PHQ-9 Score 1 0 4  Difficult doing work/chores Not difficult at all Not difficult at all Somewhat difficult     Social History   Tobacco Use  Smoking Status Never  Smokeless Tobacco Never   BP Readings from Last 3 Encounters:  05/15/22 108/68  03/20/22 (!) 172/90  02/21/22 126/70   Pulse Readings from Last 3 Encounters:  05/15/22 87  03/20/22 61  02/21/22 (!) 59   Wt Readings from Last 3 Encounters:  05/15/22 176 lb (79.8  kg)  02/21/22 177 lb 9.6 oz (80.6 kg)  12/31/21 181 lb 3.2 oz (82.2 kg)   BMI Readings from Last 3 Encounters:  05/15/22 23.22 kg/m  02/21/22 23.43 kg/m  12/31/21 23.91 kg/m    Assessment/Interventions: Review of patient past medical history, allergies, medications, health status, including review of consultants reports, laboratory and other test data, was performed as part of comprehensive evaluation and provision of chronic care management services.   SDOH:  (Social Determinants of Health) assessments and interventions performed: Yes  SDOH Interventions    Flowsheet Row Chronic Care Management from 06/04/2022 in [place] at [place] from 07/02/2021 in [place] Management from 03/07/2021 in [place] at [place] Management from 02/26/2021 in [place] at [place] from 12/28/2020 in [place] at [place] Interventions -- -- -- Intervention Not Indicated Intervention Not Indicated  Housing Interventions -- -- -- Intervention Not Indicated Intervention Not Indicated  Transportation Interventions -- -- Intervention Not Indicated Intervention Not Indicated  [friends driving to appts has number for [facility] transportation is needed] Intervention Not Indicated  Depression Interventions/Treatment  -- Medication -- -- --  Financial Strain Interventions Intervention Not Indicated -- Intervention Not Indicated -- Intervention Not Indicated  Physical Activity Interventions -- -- -- -- Intervention Not Indicated  Stress Interventions -- -- -- -- Intervention Not Indicated  Social Connections Interventions -- -- -- -- Intervention Not Indicated       SDOH Screenings   Food Insecurity: No Food Insecurity (12/09/2021)  Housing: Low Risk  (02/26/2021)  Transportation Needs: No Transportation Needs (12/09/2021)   Alcohol Screen: Low Risk  (12/28/2020)  Depression (PHQ2-9): Low Risk  (05/15/2022)  Financial Resource Strain: Low Risk  (06/04/2022)  Physical Activity: Sufficiently Active (12/09/2021)  Social Connections: Socially Isolated (12/28/2020)  Stress: Stress Concern Present (12/09/2021)  Tobacco Use: Low Risk  (05/15/2022)    [place]  Allergies  Allergen Reactions   Codeine Other (See Comments)    hallucinations   Lisinopril Cough    Medications Reviewed Today     Reviewed by Viona Gilmore, [facility] (Pharmacist) on 06/04/22 at 1328  Med List Status: <None>   Medication Order Taking? Sig Documenting Provider Last Dose Status Informant  aspirin 81 MG chewable tablet [id number] No Chew 1 tablet (81 mg total) by mouth daily. [name], MD Taking Active Self  BRILINTA 90 MG TABS tablet [id number] No TAKE ONE TABLET BY MOUTH EVERY MORNING and TAKE ONE TABLET BY MOUTH EVERY EVENING [name], MD Taking Active   carvedilol (COREG) 3.125 MG tablet [id number] No TAKE ONE TABLET BY MOUTH EVERY MORNING and TAKE ONE TABLET BY MOUTH EVERY EVENING [name], MD Taking Active   Cholecalciferol (VITAMIN D) 50 MCG (2000 UT) CAPS [id number] No Take 1 capsule by mouth daily. [provider] Taking Active   diclofenac Sodium (VOLTAREN) 1 % GEL [id number] No Apply 2 g topically 2 (two) times daily as needed (pain). [provider] Taking Active Self  DULoxetine (CYMBALTA) 20 MG capsule [id number] No Take 1 capsule (20 mg total) by mouth daily. [name], MD Taking Active   ENTRESTO 24-26 MG [id number] No TAKE ONE TABLET BY MOUTH twice daily Bensimhon, Shaune Pascal, MD Taking Active   FARXIGA 10 MG TABS tablet [id number] No TAKE ONE TABLET BY MOUTH BEFORE [place], Maricela Bo, [place] Taking Active   finasteride ([name]) 5 MG tablet [id number] No Take 1 tablet (5 mg total) by mouth daily. [name], MD Taking Active Self  ketoconazole (NIZORAL) 2 % cream [id number] No Apply 1  application topically daily as needed for irritation. [provider] Taking Active Self           Med Note Silver Huguenin Mar 20, 2022  6:26 PM) prn  LORazepam (ATIVAN) 2 MG tablet [id number] No Take 1 tablet (2 mg  total) by mouth every 6 (six) hours as needed for anxiety. [name], MD Taking Active   melatonin 3 MG TABS tablet [id number] No Take 1 tablet by mouth daily. [provider] Taking Active   nitroGLYCERIN (NITROSTAT) 0.4 MG SL tablet [id number] No Place 1 tablet (0.4 mg total) under the tongue every 5 (five) minutes as needed for chest pain. [name], MD Taking Active Self  omeprazole (PRILOSEC) 20 MG capsule [id number] No Take 1 capsule (20 mg total) by mouth as needed. [name], MD Taking Active            Med Note Marilu Favre   Thu Mar 20, 2022  6:26 PM) prn  rosuvastatin (CRESTOR) 10 MG tablet [id number] No Take 1 tablet (10 mg total) by mouth daily. [name], MD Taking Active Self  sildenafil (REVATIO) 20 MG tablet [id number] No Take 20 mg by mouth as needed. [provider] Taking Active   Discontinued 12/17/11 1519 (Error)   triamcinolone cream (KENALOG) 0.1 % [id number] No Apply 1 application topically 2 (two) times daily as needed (psoriasis). 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   CKD (chronic kidney disease), stage III ([place]) 05/30/2021   Hypertension    Hemoptysis    Acute hypoxemic respiratory failure (HCC)    Acute renal failure (HCC)    Encephalopathy acute    Cardiogenic shock ([place]) 01/03/2021   Atypical chest pain 10/25/2019   Coronary artery calcification seen on CAT scan 06/15/2019   Nodule of lower lobe of left lung 06/15/2019   Tremor of right hand  04/24/2017   Numbness and tingling of both legs 04/24/2017   Bilateral leg edema 06/05/2015   Chronic neck pain 06/05/2015   Plantar fasciitis 02/01/2014   Visual floaters 04/04/2013   Subjective vision disturbance, left 04/04/2013   BPH with urinary obstruction 01/01/2010   MELANOMA, HX OF 06/01/2009   NECK PAIN 09/11/2008   CELLULITIS AND ABSCESS OF LEG EXCEPT FOOT 04/26/2008   ERECTILE DYSFUNCTION 10/12/2007   Hyperlipemia, mixed 04/12/2007   Anxiety state 04/12/2007   Essential hypertension 04/12/2007   GERD 04/12/2007    Immunization History  Administered Date(s) Administered   Fluad Quad(high Dose 65+) 06/15/2019, 05/30/2021   Influenza Split 08/13/2011, 06/08/2012, 06/22/2013   Influenza Whole 06/22/2005, 06/19/2010   Influenza, High Dose Seasonal PF 07/05/2017, 05/28/2020   Influenza-Unspecified 07/14/2015, 07/12/2016, 07/05/2017   PFIZER Comirnaty(Gray Top)Covid-19 Tri-Sucrose Vaccine 05/10/2021   PFIZER(Purple Top)SARS-COV-2 Vaccination 10/31/2019, 11/26/2019, 07/07/2020   Pfizer Covid-19 Vaccine Bivalent Booster 53yr & up 08/08/2021   Pneumococcal Conjugate-13 08/11/2013   Pneumococcal Polysaccharide-23 04/23/2016   Td 06/19/2010   Tdap 09/12/2013   Zoster Recombinat (Shingrix) 12/20/2021, 04/11/2022   Zoster, Live 08/11/2013   Patient did activate his silver sneakers [facility] He does this when its hot in the summer. It is only 2 miles away.  Patient reports his sleep is 100% better with the duloxetine. He sometimes wakes up at 5:30-6 but that doesn't happen often. Once the duloxetine was restarted, he improved and he feels better now too.   Patient has cut out sweets and he thinks this is why his A1c came down. Patient did a salt  diary a few days within the last month and usually falls < 1500 mcg sodium per day.  Patient reports he was not feeling great in July because it was a lot hotter. Patient reports he has always lived in [place] and reports the  heat is getting worse during the summer for him. Patient doesn't have extra swelling in the summer.   Conditions to be addressed/monitored:  Hypertension, Hyperlipidemia, Heart Failure, Coronary Artery Disease, GERD, Depression, Anxiety, and BPH  Conditions addressed this visit: Hypertension, heart failure  Care Plan : [place]  Updates made by Viona Gilmore, [place] since 06/04/2022 12:00 AM     Problem: Problem: Hypertension, Hyperlipidemia, Heart Failure, Coronary Artery Disease, GERD, Depression, Anxiety, and BPH      Long-Range Goal: Patient-Specific Goal   Start Date: 03/07/2021  Expected End Date: 03/07/2022  Recent Progress: On track  Priority: High  Note:   Current Barriers:  Unable to independently monitor therapeutic efficacy  Pharmacist Clinical Goal(s):  Patient will achieve adherence to monitoring guidelines and medication adherence to achieve therapeutic efficacy through collaboration with PharmD and provider.   Interventions: 1:1 collaboration with [name], MD regarding development and update of comprehensive plan of care as evidenced by provider attestation and co-signature Inter-disciplinary care team collaboration (see longitudinal plan of care) Comprehensive medication review performed; medication list updated in electronic medical record  Hypertension (BP goal <130/80) -Controlled -Current treatment: Carvedilol 3.125 mg 1 tablet twice daily - Appropriate, Effective, Safe, Accessible Entresto 24-26 mg 1 tablet twice daily - Appropriate, Effective, Safe, Accessible -Medications previously tried: hydralazine (hypotension), losartan (replaced by [name]), spironolactone (hypotension) -Current home readings: 115/71, 120/65 (this happened some in cardiac rehab) -Current dietary habits: strictly limiting salt intake  -Current exercise habits: did not discuss -Denies hypotensive/hypertensive symptoms -Educated on BP goals and benefits of  medications for prevention of heart attack, stroke and kidney damage; Exercise goal of 150 minutes per week; Importance of home blood pressure monitoring; Proper BP monitoring technique; -Counseled to monitor BP at home weekly, document, and provide log at future appointments -Counseled on diet and exercise extensively Recommended to continue current medication  Hyperlipidemia: (LDL goal < 70) -Controlled -Current treatment: Rosuvastatin 10 mg 1 tablet daily - Appropriate, Effective, Safe, Accessible -Medications previously tried: none  -Current dietary patterns: not eating fried foods -Current exercise habits: PT exercises -Educated on Cholesterol goals;  Benefits of statin for ASCVD risk reduction; Exercise goal of 150 minutes per week; -Counseled on diet and exercise extensively Recommended to continue current medication  Heart Failure (Goal: manage symptoms and prevent exacerbations) -Controlled -Last ejection fraction: 25-30% (Date: 4/22) -HF type: Systolic -NYHA Class: II (slight limitation of activity) -AHA HF Stage: B (Heart disease present - no symptoms present) -Current treatment: Farxiga 10 mg 1 tablet before breakfast - Appropriate, Effective, Safe, Accessible Furosemide 40 mg 1 tablet as needed - Appropriate, Effective, Safe, Accessible Carvedilol 3.125 mg 1 tablet twice daily - Appropriate, Effective, Safe, Accessible Entresto 24-26 mg 1 tablet twice daily - Appropriate, Effective, Safe, Accessible -Medications previously tried: spironolactone, hydralazine -Current home BP/HR readings: refer to above -Current dietary habits: previously used a salt diary and not going over 1500 mg per day since starting -Current exercise habits: PT exercises every day; walking dog every morning -Educated on Benefits of medications for managing symptoms and prolonging life Importance of weighing daily; if you gain more than 3 pounds in one day or 5 pounds in one week, call  cardiologist Proper diuretic  administration and potassium supplementation -Counseled on diet and exercise extensively Recommended to continue current medication  CAD/STEMI (Goal: prevent future cardiac events) -Controlled -Current treatment  Aspirin 81 mg 1 tablet daily - Appropriate, Effective, Safe, Accessible Brilinta 90 mg 1 tablet twice daily - Appropriate, Effective, Safe, Accessible Nitroglycerin 0.4 mg 1 tablet as needed - Appropriate, Effective, Safe, Accessible -Medications previously tried: isosorbide (hypotension) -Recommended to continue current medication Counseled on difference between isosorbide and nitroglycerin. Patient is aware to avoid NSAIDs and to monitor for bleeding  Depression/Anxiety (Goal: minimize symptoms) -Controlled -Current treatment: Lorazepam 2 mg 1 tablet every 6 hours as needed - Appropriate, Effective, Query Safe, Accessible Duloxetine 20 mg 1 capsule daily - Appropriate, Effective, Safe, Accessible -Medications previously tried/failed: n/a -PHQ9: 0 -GAD7: n/a -Educated on Benefits of medication for symptom control Benefits of cognitive-behavioral therapy with or without medication -Recommended to continue current medication Counseled on risks of long term use of benzodiazepines  GERD (Goal: minimize symptoms) -Controlled -Current treatment  Omeprazole 20 mg 1 capsule daily as needed - Appropriate, Effective, Safe, Accessible -Medications previously tried: none  -Recommended to continue current medication  BPH (Goal: minimize symptoms) -Controlled -Current treatment  Finasteride 5 mg 1 tablet daily - Appropriate, Effective, Safe, Accessible -Medications previously tried: tamsulosin -Recommended to continue current medication   Health Maintenance -Vaccine gaps: COVID booster -Current therapy:  Acetaminophen 500 mg as needed Diclofenac gel 1% as needed Methocarbamol 500 mg 1 tablet as needed -Educated on Cost vs benefit of each  product must be carefully weighed by individual consumer -Patient is satisfied with current therapy and denies issues -Recommended to continue current medication Educated on if he wanted to restart melatonin he could with a low dose  Patient Goals/Self-Care Activities Patient will:  - take medications as prescribed check blood pressure at least weekly, document, and provide at future appointments target a minimum of 150 minutes of moderate intensity exercise weekly  Follow Up Plan: Telephone follow up appointment with care management team member scheduled for:6 months        Medication Assistance: None required.  Patient affirms current coverage meets needs.  Compliance/Adherence/Medication fill history: Care Gaps: Influenza, COVID booster, foot exam Last BP - 108/68 05/15/22 Last A1C - 5.3 on 02/21/22  Star-Rating Drugs: Farxiga 10 mg - last filled 05/16/2022 30 [facility] at [facility] Rosuvastatin 10 mg - last filled 05/16/2022 30 DS at [facility]  Patient's preferred pharmacy is:  [facility] - [place], [place] - [address] [place] [address] Phone: [phone] Fax: [phone]  [facility] [id number], [place] [address] Phone: [phone] Fax: [phone]  [id number] [place]Phone: [phone]: [phone]  Uses pill box? Yes Pt endorses 99% compliance - exception of hydralazine which he is taking once daily  We discussed: Benefits of medication synchronization, packaging and delivery as well as enhanced pharmacist oversight with [facility]. Patient decided to: Utilize [facility] pharmacy for medication synchronization, packaging and delivery  Care Plan and Follow Up Patient Decision:  Patient agrees to Care Plan and Follow-up.  Plan: Telephone follow up appointment  with care management team member scheduled for:  6 months  MJeni Salles PharmD, BVenice GardensPharmacist LTharptownat [phone]

## 2022-06-04 NOTE — Patient Instructions (Addendum)
Hi Lebert,  It was great to catch up! Glad things are going so well with you lately!   Please reach out to me if you have any questions or need anything!  Best, Maddie  Jeni Salles, PharmD, [facility]   Visit Information   Goals Addressed   None    Patient Care Plan: RNCM:Cardiovascular Disease Management(HF, CAD, HTN, HLD)  Completed 08/30/2021   Problem Identified: Lack of long term management of Cardiovascular Disease Management(HF, CAD, HTN, HLD) Resolved 08/30/2021  Priority: High     Long-Range Goal: Effective self managment of Cardiovascular Disease(HF, CAD, HTN, HLD) Completed 08/30/2021  Start Date: 02/26/2021  Expected End Date: 11/20/2021  Recent Progress: On track  Priority: High  Note:   Resolving due to duplicate goal  Current Barriers:  Knowledge deficits related to basic heart failure pathophysiology and self care management, hx of CAD,HTN,HLD Unable to independently self management of Cardiovascular Disease (HF, CAD, HTN, HLD) Unable to perform IADLs independently Pt hospitalized from 01/03/21-02/07/21 with cardiogenic shock and STEMI. Pt lives alone but has a very supportive network of friends and neighbors who have been assisting him with meals, shopping and transportation.   States he had a follow up appointment at the heart failure clinic on 06/10/21. States Dr. Haroldine Laws stopped his spirolactone, hydralazine and isosorbide. States he is taking his Entresto twice a day as ordered.  States he saw Dr. Sarajane Jews for his physical and he is sending him to a nephrologist to check on his kidney function .  States he is walking daily and he has not been doing exercises while he was recovering from epididymitis.  States he does have some shortness of breath when he is doing his exercise or household chores.  Denies any chest pains.States he is weighing, checking B/P and O2 sat daily.  States his weight has been stable  between 175-177.  B/P was 121/63 today.  States he is keeping a log of his readings  States he has been following a low sodium diet and trying to eat more vegetables and fruits. States he is following a 2 L/day fluid restriction.  States he is starting cardiac rehab on 07/02/21 Nurse Case Manager Clinical Goal(s):  patient will weigh self daily and record patient will verbalize understanding of Heart Failure Action Plan and when to call doctor patient will take all Heart Failure mediations as prescribed Interventions:  Collaboration with [name], MD regarding development and update of comprehensive plan of care as evidenced by provider attestation and co-signature Inter-disciplinary care team collaboration (see longitudinal plan of care) Reviewed upcoming provider visits-Heart and vascular 08/13/21, CCM-pharmacist 12/03/21 Reviewed basic overview and discussion of pathophysiology of Heart Failure, HTN,HLD Reinforced to follow a low sodium diet Reinforced Heart Failure Action Plan Reinforced importance of daily weight Reinforced role of diuretics in prevention of fluid overload Reinforced how to use NTG tablets as needed and when to call 911 Reviewed to progress his activity as tolerated and to avoid extremes in temperature   Self-Care Activities:  Takes Heart Failure Medications as prescribed Weighs daily and record (notifying MD of 3 lb weight gain over night or 5 lb in a week) Verbalizes understanding of and follows CHF Action Plan Adheres to low sodium diet  Patient Goals:  - Take Heart Failure Medications as prescribed - Weigh daily and record (notify MD with 3 lb weight gain over night or 5 lb in a week) - Follow CHF Action Plan - Adhere  to low sodium diet - begin a heart failure diary - bring diary to all appointments - eat more whole grains, fruits and vegetables, lean meats and healthy fats - follow rescue plan if symptoms flare-up - know when to call the doctor - track  symptoms and what helps feel better or worse - dress right for the weather, hot or cold - follow activity or exercise plan - check blood pressure daily - choose a place to take my blood pressure (home, clinic or office, retail store) - write blood pressure results in a log or diary   -Take B/P medications as ordered -Plan to follow a low salt diet  -Increase activity as tolerated Follow Up Plan: Telephone follow up appointment with care management team member scheduled for: 08/23/21 at 10 AM The patient has been provided with contact information for the care management team and has been advised to call with any health related questions or concerns.      Patient Care Plan: CCM Pharmacy Care Plan     Problem Identified: Problem: Hypertension, Hyperlipidemia, Heart Failure, Coronary Artery Disease, GERD, Depression, Anxiety, and BPH      Long-Range Goal: Patient-Specific Goal   Start Date: 03/07/2021  Expected End Date: 03/07/2022  Recent Progress: On track  Priority: High  Note:   Current Barriers:  Unable to independently monitor therapeutic efficacy  Pharmacist Clinical Goal(s):  Patient will achieve adherence to monitoring guidelines and medication adherence to achieve therapeutic efficacy through collaboration with PharmD and provider.   Interventions: 1:1 collaboration with [name], MD regarding development and update of comprehensive plan of care as evidenced by provider attestation and co-signature Inter-disciplinary care team collaboration (see longitudinal plan of care) Comprehensive medication review performed; medication list updated in electronic medical record  Hypertension (BP goal <130/80) -Controlled -Current treatment: Carvedilol 3.125 mg 1 tablet twice daily - Appropriate, Effective, Safe, Accessible Entresto 24-26 mg 1 tablet twice daily - Appropriate, Effective, Safe, Accessible -Medications previously tried: hydralazine (hypotension), losartan (replaced  by [name]), spironolactone (hypotension) -Current home readings: 117/60, 124/61 (highest since Feb), 103/57 (this happened some in cardiac rehab) -Current dietary habits: strictly limiting salt intake  -Current exercise habits: did not discuss -Denies hypotensive/hypertensive symptoms -Educated on BP goals and benefits of medications for prevention of heart attack, stroke and kidney damage; Exercise goal of 150 minutes per week; Importance of home blood pressure monitoring; Proper BP monitoring technique; -Counseled to monitor BP at home weekly, document, and provide log at future appointments -Counseled on diet and exercise extensively Recommended to continue current medication  Hyperlipidemia: (LDL goal < 70) -Controlled -Current treatment: Rosuvastatin 10 mg 1 tablet daily - Appropriate, Effective, Safe, Accessible -Medications previously tried: none  -Current dietary patterns: not eating fried foods -Current exercise habits: PT exercises -Educated on Cholesterol goals;  Benefits of statin for ASCVD risk reduction; Exercise goal of 150 minutes per week; -Counseled on diet and exercise extensively Recommended to continue current medication  Heart Failure (Goal: manage symptoms and prevent exacerbations) -Controlled -Last ejection fraction: 25-30% (Date: 4/22) -HF type: Systolic -NYHA Class: II (slight limitation of activity) -AHA HF Stage: B (Heart disease present - no symptoms present) -Current treatment: Farxiga 10 mg 1 tablet before breakfast - Appropriate, Effective, Safe, Accessible Furosemide 40 mg 1 tablet as needed - Appropriate, Effective, Safe, Accessible Carvedilol 3.125 mg 1 tablet twice daily - Appropriate, Effective, Safe, Accessible Entresto 24-26 mg 1 tablet twice daily - Appropriate, Effective, Safe, Accessible -Medications previously tried: spironolactone, hydralazine -Current home  BP/HR readings: refer to above -Current dietary habits: previously used a  salt diary and not going over 1500 mg per day since starting -Current exercise habits: PT exercises every day; walking dog every morning -Educated on Benefits of medications for managing symptoms and prolonging life Importance of weighing daily; if you gain more than 3 pounds in one day or 5 pounds in one week, call cardiologist Proper diuretic administration and potassium supplementation -Counseled on diet and exercise extensively Recommended to continue current medication  CAD/STEMI (Goal: prevent future cardiac events) -Controlled -Current treatment  Aspirin 81 mg 1 tablet daily - Appropriate, Effective, Safe, Accessible Brilinta 90 mg 1 tablet twice daily - Appropriate, Effective, Safe, Accessible Nitroglycerin 0.4 mg 1 tablet as needed - Appropriate, Effective, Safe, Accessible -Medications previously tried: isosorbide (hypotension) -Recommended to continue current medication Counseled on difference between isosorbide and nitroglycerin. Patient is aware to avoid NSAIDs and to monitor for bleeding  Depression/Anxiety (Goal: minimize symptoms) -Controlled -Current treatment: Lorazepam 2 mg 1 tablet every 6 hours as needed - Appropriate, Effective, Query Safe, Accessible Duloxetine 20 mg 1 capsule daily - Appropriate, Effective, Safe, Accessible -Medications previously tried/failed: Cymbalta  -PHQ9: 0 -GAD7: n/a -Educated on Benefits of medication for symptom control Benefits of cognitive-behavioral therapy with or without medication -Recommended to continue current medication Counseled on risks of long term use of benzodiazepines  GERD (Goal: minimize symptoms) -Controlled -Current treatment  Omeprazole 20 mg 1 capsule daily as needed - Appropriate, Effective, Safe, Accessible -Medications previously tried: none  -Recommended to continue current medication  BPH (Goal: minimize symptoms) -Controlled -Current treatment  Finasteride 5 mg 1 tablet daily - Appropriate,  Effective, Safe, Accessible -Medications previously tried: tamsulosin -Recommended to continue current medication   Health Maintenance -Vaccine gaps: shingrix, COVID booster -Current therapy:  Acetaminophen 500 mg as needed Diclofenac gel 1% as needed Methocarbamol 500 mg 1 tablet as needed -Educated on Cost vs benefit of each product must be carefully weighed by individual consumer -Patient is satisfied with current therapy and denies issues -Recommended to continue current medication Educated on if he wanted to restart melatonin he could with a low dose  Patient Goals/Self-Care Activities Patient will:  - take medications as prescribed check blood pressure at least weekly, document, and provide at future appointments target a minimum of 150 minutes of moderate intensity exercise weekly  Follow Up Plan: The patient has been provided with contact information for the care management team and has been advised to call with any health related questions or concerns.       Patient Care Plan: RN Care Manager Plan of Care  Completed 03/20/2022   Problem Identified: Chronic Disease Management and Care Coordination Needs(HF, CAD, HTN, HLD) Resolved 03/20/2022  Priority: High     Long-Range Goal: Establish Plan of Care for Chronic Disease Management Needs (HF, CAD, HTN, HLD) Completed 03/20/2022  Start Date: 08/30/2021  Expected End Date: 02/21/2023  Priority: High  Note:   RNCM case closed goals met Current Barriers:  Chronic Disease Management support and education needs related to CHF, CAD, HTN, and HLD    States he has been feeling good  States he  exercising and walking daily.  States he now [facility] [employer] to exercise 3 times a week. Denies any chest pains, swelling or shortness of breath. States he is weighing, checking B/P and O2 sat daily.  States his weight has been stable between 173-175.  B/P was 122/65 today.  States he is keeping a log  of his readings  States he has been  following a low sodium diet and trying to eat more vegetables and fruits. States he follows his  fluid restriction.  States he  saw the kidney doctor  in May.  States hs anxiety and sleep is much better since he started taking the Cymbalta.  RNCM Clinical Goal(s):  Patient will verbalize understanding of plan for management of CHF, CAD, HTN, and HLD as evidenced by voiced adherence to plan of care verbalize basic understanding of  CHF, CAD, HTN, and HLD disease process and self health management plan as evidenced by voiced understanding and teach back take all medications exactly as prescribed and will call provider for medication related questions as evidenced by dispense report and pt verbalization attend all scheduled medical appointments: CCM Pharm D 06/04/22, Dr. Sarajane Jews 02/21/22 Dr. Haroldine Laws October,  nephrology August 2023  as evidenced by medical records demonstrate Ongoing adherence to prescribed treatment plan for CHF, CAD, HTN, and HLD as evidenced by readings within limits, voiced adherence to plan of care continue to work with RN Care Manager to address care management and care coordination needs related to  CHF, CAD, HTN, and HLD as evidenced by adherence to CM Team Scheduled appointments through collaboration with RN Care manager, provider, and care team.   Interventions: 1:1 collaboration with primary care provider regarding development and update of comprehensive plan of care as evidenced by provider attestation and co-signature Inter-disciplinary care team collaboration (see longitudinal plan of care) Evaluation of current treatment plan related to  self management and patient's adherence to plan as established by provider    CAD Interventions: (Status:  Goal Met.) Long Term Goal Assessed understanding of CAD diagnosis Medications reviewed including medications utilized in CAD treatment plan Provided education on importance of blood pressure control in management of CAD Counseled on  importance of regular laboratory monitoring as prescribed Counseled on the importance of exercise goals with target of 150 minutes per week Reviewed Importance of taking all medications as prescribed Reviewed Importance of attending all scheduled provider appointments Advised to report any changes in symptoms or exercise tolerance Reviewed to continue his exercise and continue to go to the gym to exercise   Heart Failure Interventions:  (Status:  Goal Met.) Long Term Goal Provided education on low sodium diet Reviewed Heart Failure Action Plan in depth and provided written copy Discussed importance of daily weight and advised patient to weigh and record daily Reviewed role of diuretics in prevention of fluid overload and management of heart failure; Discussed the importance of keeping all appointments with provider Provided patient with education about the role of exercise in the management of heart failure Reviewed s/sx of HF to call MD  Hyperlipidemia Interventions:  (Status:  Goal Met.) Long Term Goal Medication review performed; medication list updated in electronic medical record.  Provider established cholesterol goals reviewed Counseled on importance of regular laboratory monitoring as prescribed Reviewed role and benefits of statin for ASCVD risk reduction Reviewed importance of limiting foods high in cholesterol Reviewed exercise goals and target of 150 minutes per week  Hypertension Interventions:  (Status:  Goal Met.) Long Term Goal Last practice recorded BP readings:  BP Readings from Last 3 Encounters:  02/17/22 (!) 145/70  12/31/21 112/68  12/20/21 110/62  Most recent eGFR/CrCl: No results found for: EGFR  No components found for: CRCL  Evaluation of current treatment plan related to hypertension self management and patient's adherence to plan as established by provider Provided education to  patient re: stroke prevention, s/s of heart attack and stroke Counseled on  the importance of exercise goals with target of 150 minutes per week Discussed plans with patient for ongoing care management follow up and provided patient with direct contact information for care management team Advised patient, providing education and rationale, to monitor blood pressure daily and record, calling PCP for findings outside established parameters Provided education on prescribed diet low sodium heart healthy Reviewed to notify doctor if his B/P increases and stays elevated  Patient Goals/Self-Care Activities: Take all medications as prescribed Attend all scheduled provider appointments Call provider office for new concerns or questions  call office if I gain more than 2 pounds in one day or 5 pounds in one week keep legs up while sitting track weight in diary watch for swelling in feet, ankles and legs every day weigh myself daily follow rescue plan if symptoms flare-up eat more whole grains, fruits and vegetables, lean meats and healthy fats track symptoms and what helps feel better or worse dress right for the weather, hot or cold check blood pressure daily choose a place to take my blood pressure (home, clinic or office, retail store) take blood pressure log to all doctor appointments begin an exercise program eat more whole grains, fruits and vegetables, lean meats and healthy fats limit salt intake to 1542m/day call for medicine refill 2 or 3 days before it runs out take all medications exactly as prescribed call doctor with any symptoms you believe are related to your medicine  Follow Up Plan:  The patient has been provided with contact information for the care management team and has been advised to call with any health related questions or concerns.  No further follow up required: RNCM case closed goals met        Patient verbalizes understanding of instructions and care plan provided today and agrees to view in [facility] status and patient  understanding of how to access instructions and care plan via MyChart confirmed with patient.    Telephone follow up appointment with pharmacy team member scheduled for: 6 months  [facility]

## 2022-06-05 ENCOUNTER — Other Ambulatory Visit: Payer: Self-pay | Admitting: Family Medicine

## 2022-06-05 DIAGNOSIS — R3912 Poor urinary stream: Secondary | ICD-10-CM | POA: Diagnosis not present

## 2022-06-05 DIAGNOSIS — D49511 Neoplasm of unspecified behavior of right kidney: Secondary | ICD-10-CM | POA: Diagnosis not present

## 2022-06-05 DIAGNOSIS — N401 Enlarged prostate with lower urinary tract symptoms: Secondary | ICD-10-CM | POA: Diagnosis not present

## 2022-06-06 ENCOUNTER — Telehealth: Payer: Self-pay | Admitting: Pharmacist

## 2022-06-06 NOTE — Chronic Care Management (AMB) (Signed)
Chronic Care Management Pharmacy Assistant   Name: [name]  MRN: [id number] DOB: 12/06/1950  Reason for Encounter: Medication Review / Medication Coordination Call   Recent office visits:  None  Recent consult visits:  None  Hospital visits:  None  Medications: Outpatient Encounter Medications as of 06/06/2022  Medication Sig Note   aspirin 81 MG chewable tablet Chew 1 tablet (81 mg total) by mouth daily.    BRILINTA 90 MG TABS tablet TAKE ONE TABLET BY MOUTH EVERY MORNING and TAKE ONE TABLET BY MOUTH EVERY EVENING    carvedilol (COREG) 3.125 MG tablet TAKE ONE TABLET BY MOUTH EVERY MORNING and TAKE ONE TABLET BY MOUTH EVERY EVENING    Cholecalciferol (VITAMIN D) 50 MCG (2000 UT) CAPS Take 1 capsule by mouth daily.    diclofenac Sodium (VOLTAREN) 1 % GEL Apply 2 g topically 2 (two) times daily as needed (pain).    DULoxetine (CYMBALTA) 20 MG capsule Take 1 capsule (20 mg total) by mouth daily.    ENTRESTO 24-26 MG TAKE ONE TABLET BY MOUTH twice daily    FARXIGA 10 MG TABS tablet TAKE ONE TABLET BY MOUTH BEFORE BREAKFAST    finasteride ([name]) 5 MG tablet Take 1 tablet (5 mg total) by mouth daily.    ketoconazole (NIZORAL) 2 % cream Apply 1 application topically daily as needed for irritation. 03/20/2022: prn   LORazepam (ATIVAN) 2 MG tablet Take 1 tablet (2 mg total) by mouth every 6 (six) hours as needed for anxiety.    melatonin 3 MG TABS tablet Take 1 tablet by mouth daily.    nitroGLYCERIN (NITROSTAT) 0.4 MG SL tablet Place 1 tablet (0.4 mg total) under the tongue every 5 (five) minutes as needed for chest pain.    omeprazole (PRILOSEC) 20 MG capsule TAKE ONE CAPSULE BY MOUTH daily as needed    rosuvastatin (CRESTOR) 10 MG tablet TAKE ONE TABLET BY MOUTH ONCE DAILY    sildenafil (REVATIO) 20 MG tablet Take 20 mg by mouth as needed.    triamcinolone cream (KENALOG) 0.1 % Apply 1 application topically 2 (two) times daily as needed (psoriasis).    [DISCONTINUED]  tadalafil (CIALIS) 20 MG tablet Take 20 mg by mouth daily as needed.      No facility-administered encounter medications on file as of 06/06/2022.   Reviewed chart for medication changes ahead of medication coordination call.  BP Readings from Last 3 Encounters:  05/15/22 108/68  03/20/22 (!) 172/90  02/21/22 126/70    Lab Results  Component Value Date   HGBA1C 5.3 02/21/2022     Patient obtains medications through Adherence Packaging  30 Days    Last adherence pickup included:  Farxiga 10 mg: one tablet before breakfast Rosuvastatin 10 mg: one tablet at breakfast Finasteride  5 mg: one tablet before breakfast Carvedilol  3.125 mg: 1 tablet at breakfast and 1 tablet at dinner Brilinta 90 mg: 1 tablet at breakfast and 1 tablet at dinner Omeprazole 20 mg  - 1 tablet daily as needed    Patient declined (meds) last month: No declined medications      Next adherence delivery scheduled on: 06/18/2022   Called patient and reviewed medications and coordinated delivery.   This delivery to include: Farxiga 10 mg: one tablet before breakfast Rosuvastatin 10 mg: one tablet at breakfast Finasteride  5 mg: one tablet before breakfast Carvedilol  3.125 mg: 1 tablet at breakfast and 1 tablet at dinner Brilinta 90 mg: 1 tablet at breakfast and  1 tablet at dinner   Patient will need a short fill: Patient declines short fills   Coordinated acute fill: Patient declines acute fills   Patient declined the following medications: Entresto 24/26 mg - 1 tablet twice daily (90 DS) not due until 08/07/2022 Omeprazole 20 mg  - 1 tablet daily as needed (vial)   Confirmed delivery date of 06/18/2022, advised patient that pharmacy will contact them the morning of delivery.  Care Gaps: AWV - completed 12/09/2021 Last BP - 108/68 on 05/15/2022 Last A1C - 5.3 on 02/21/2022 Foot exam - never done Urine ACR - never done Covid booster - overdue Flu - due  Star Rating Drugs: Farxiga 10 mg - last filled  05/06/2022 30 DS at [facility] Rosuvastatin 10 mg - last filled 05/16/2022 30 DS at [facility] [phone]

## 2022-06-11 ENCOUNTER — Ambulatory Visit (INDEPENDENT_AMBULATORY_CARE_PROVIDER_SITE_OTHER): Payer: Medicare PPO | Admitting: Family Medicine

## 2022-06-11 ENCOUNTER — Encounter: Payer: Self-pay | Admitting: Family Medicine

## 2022-06-11 VITALS — BP 112/70 | HR 59 | Temp 97.5°F | Ht 71.75 in | Wt 180.0 lb

## 2022-06-11 DIAGNOSIS — F411 Generalized anxiety disorder: Secondary | ICD-10-CM

## 2022-06-11 DIAGNOSIS — I10 Essential (primary) hypertension: Secondary | ICD-10-CM | POA: Diagnosis not present

## 2022-06-11 DIAGNOSIS — N1832 Chronic kidney disease, stage 3b: Secondary | ICD-10-CM

## 2022-06-11 DIAGNOSIS — M542 Cervicalgia: Secondary | ICD-10-CM | POA: Diagnosis not present

## 2022-06-11 DIAGNOSIS — R911 Solitary pulmonary nodule: Secondary | ICD-10-CM

## 2022-06-11 DIAGNOSIS — E1122 Type 2 diabetes mellitus with diabetic chronic kidney disease: Secondary | ICD-10-CM | POA: Diagnosis not present

## 2022-06-11 DIAGNOSIS — G8929 Other chronic pain: Secondary | ICD-10-CM

## 2022-06-11 DIAGNOSIS — R600 Localized edema: Secondary | ICD-10-CM | POA: Diagnosis not present

## 2022-06-11 DIAGNOSIS — N401 Enlarged prostate with lower urinary tract symptoms: Secondary | ICD-10-CM

## 2022-06-11 DIAGNOSIS — Z860100 Personal history of colon polyps, unspecified: Secondary | ICD-10-CM

## 2022-06-11 DIAGNOSIS — K219 Gastro-esophageal reflux disease without esophagitis: Secondary | ICD-10-CM

## 2022-06-11 DIAGNOSIS — E782 Mixed hyperlipidemia: Secondary | ICD-10-CM

## 2022-06-11 DIAGNOSIS — N138 Other obstructive and reflux uropathy: Secondary | ICD-10-CM

## 2022-06-11 DIAGNOSIS — Z23 Encounter for immunization: Secondary | ICD-10-CM

## 2022-06-11 DIAGNOSIS — I2510 Atherosclerotic heart disease of native coronary artery without angina pectoris: Secondary | ICD-10-CM

## 2022-06-11 LAB — BASIC METABOLIC PANEL
BUN: 43 mg/dL — ABNORMAL HIGH (ref 6–23)
CO2: 26 mEq/L (ref 19–32)
Calcium: 9.4 mg/dL (ref 8.4–10.5)
Chloride: 102 mEq/L (ref 96–112)
Creatinine, Ser: 1.69 mg/dL — ABNORMAL HIGH (ref 0.40–1.50)
GFR: 40.27 mL/min — ABNORMAL LOW (ref >60.00)
Glucose, Bld: 96 mg/dL (ref 70–99)
Potassium: 4 mEq/L (ref 3.5–5.1)
Sodium: 136 mEq/L (ref 135–145)

## 2022-06-11 LAB — HEPATIC FUNCTION PANEL
ALT: 29 U/L (ref 0–53)
AST: 23 U/L (ref 0–37)
Albumin: 4.3 g/dL (ref 3.5–5.2)
Alkaline Phosphatase: 34 U/L — ABNORMAL LOW (ref 39–117)
Bilirubin, Direct: 0.1 mg/dL (ref 0.0–0.3)
Total Bilirubin: 0.6 mg/dL (ref 0.2–1.2)
Total Protein: 7.2 g/dL (ref 6.0–8.3)

## 2022-06-11 LAB — CBC WITH DIFFERENTIAL/PLATELET
Basophils Absolute: 0 10*3/uL (ref 0.0–0.1)
Basophils Relative: 0.9 % (ref 0.0–3.0)
Eosinophils Absolute: 0.2 10*3/uL (ref 0.0–0.7)
Eosinophils Relative: 4.5 % (ref 0.0–5.0)
HCT: 42.1 % (ref 39.0–52.0)
Hemoglobin: 14.3 g/dL (ref 13.0–17.0)
Lymphocytes Relative: 21.1 % (ref 12.0–46.0)
Lymphs Abs: 1.2 10*3/uL (ref 0.7–4.0)
MCHC: 34 g/dL (ref 30.0–36.0)
MCV: 93.4 fl (ref 78.0–100.0)
Monocytes Absolute: 0.6 10*3/uL (ref 0.1–1.0)
Monocytes Relative: 11.5 % (ref 3.0–12.0)
Neutro Abs: 3.4 10*3/uL (ref 1.4–7.7)
Neutrophils Relative %: 62 % (ref 43.0–77.0)
Platelets: 148 10*3/uL — ABNORMAL LOW (ref 150.0–400.0)
RBC: 4.5 Mil/uL (ref 4.22–5.81)
RDW: 13.4 % (ref 11.5–15.5)
WBC: 5.5 10*3/uL (ref 4.0–10.5)

## 2022-06-11 LAB — HEMOGLOBIN A1C: Hgb A1c MFr Bld: 5.8 % (ref 4.6–6.5)

## 2022-06-11 LAB — LIPID PANEL
Cholesterol: 121 mg/dL (ref 0–200)
HDL: 47.7 mg/dL (ref >39.00)
LDL Cholesterol: 59 mg/dL (ref 0–99)
NonHDL: 72.8
Total CHOL/HDL Ratio: 3
Triglycerides: 70 mg/dL (ref 0.0–149.0)
VLDL: 14 mg/dL (ref 0.0–40.0)

## 2022-06-11 LAB — TSH: TSH: 2.3 u[IU]/mL (ref 0.35–5.50)

## 2022-06-11 MED ORDER — FINASTERIDE 5 MG PO TABS: 5.0000 mg | ORAL_TABLET | Freq: Every day | ORAL | 3 refills | Status: AC

## 2022-06-11 NOTE — Progress Notes (Signed)
Subjective:    Patient ID: [name], male    DOB: May 20, 1951, 71 y.o.   MRN: [id number]  HPI Here to follow up on issues. He feels well in general except for some low back pain. He is waiting to start PT at [facility]  for this. He recently saw Dr. Junious Silk for a Urology follow up and everything went well. His PSA was 0.74. He will have a Cardiology follow up with Dr. Haroldine Laws next month and they will do another ECHO. He walks on his treadmill every day with no SOB or chest pain. He sees Dr. Justin Mend for Nephrology follow up.    Review of Systems  Constitutional: Negative.   HENT: Negative.    Eyes: Negative.   Respiratory: Negative.    Cardiovascular: Negative.   Gastrointestinal: Negative.   Genitourinary: Negative.   Musculoskeletal:  Positive for back pain.  Skin: Negative.   Neurological: Negative.   Psychiatric/Behavioral: Negative.         Objective:   Physical Exam Constitutional:      General: He is not in acute distress.    Appearance: Normal appearance. He is well-developed. He is not diaphoretic.  HENT:     Head: Normocephalic and atraumatic.     Right Ear: External ear normal.     Left Ear: External ear normal.     Nose: Nose normal.     Mouth/Throat:     Pharynx: No oropharyngeal exudate.  Eyes:     General: No scleral icterus.       Right eye: No discharge.        Left eye: No discharge.     Conjunctiva/sclera: Conjunctivae normal.     Pupils: Pupils are equal, round, and reactive to light.  Neck:     Thyroid: No thyromegaly.     Vascular: No JVD.     Trachea: No tracheal deviation.  Cardiovascular:     Rate and Rhythm: Normal rate and regular rhythm.     Heart sounds: Normal heart sounds. No murmur heard.    No friction rub. No gallop.  Pulmonary:     Effort: Pulmonary effort is normal. No respiratory distress.     Breath sounds: Normal breath sounds. No wheezing or rales.  Chest:     Chest wall: No tenderness.  Abdominal:      General: Bowel sounds are normal. There is no distension.     Palpations: Abdomen is soft. There is no mass.     Tenderness: There is no abdominal tenderness. There is no guarding or rebound.  Genitourinary:    Penis: No tenderness.   Musculoskeletal:        General: No tenderness. Normal range of motion.     Cervical back: Neck supple.  Lymphadenopathy:     Cervical: No cervical adenopathy.  Skin:    General: Skin is warm and dry.     Coloration: Skin is not pale.     Findings: No erythema or rash.  Neurological:     Mental Status: He is alert and oriented to person, place, and time.     Cranial Nerves: No cranial nerve deficit.     Motor: No abnormal muscle tone.     Coordination: Coordination normal.     Deep Tendon Reflexes: Reflexes are normal and symmetric. Reflexes normal.  Psychiatric:        Behavior: Behavior normal.        Thought Content: Thought content normal.  Judgment: Judgment normal.           Assessment & Plan:  He is doing well as far as CAD and HTN go. His CKD and BPH are stable. He will begin PT soon for the low back pain. We will set up another low dose chest CT to follow up the lung nodule (the last scan was in Sept. 2021. We will get fasting labs today to check lipids, A1c, etc. He will be due for another colonoscopy next month so we will contact the GI about this. We spent a total of (34   ) minutes reviewing records and discussing these issues.  [name] Penna, MD

## 2022-06-11 NOTE — Addendum Note (Signed)
Addended by: Wyvonne Lenz on: 06/11/2022 08:59 AM   Modules accepted: Orders

## 2022-06-13 ENCOUNTER — Telehealth: Payer: Medicare PPO

## 2022-06-17 ENCOUNTER — Encounter: Payer: Self-pay | Admitting: Gastroenterology

## 2022-06-18 ENCOUNTER — Encounter: Payer: Self-pay | Admitting: Physician Assistant

## 2022-06-18 ENCOUNTER — Ambulatory Visit (INDEPENDENT_AMBULATORY_CARE_PROVIDER_SITE_OTHER): Payer: Medicare PPO | Admitting: Physician Assistant

## 2022-06-18 ENCOUNTER — Telehealth: Payer: Self-pay

## 2022-06-18 VITALS — BP 132/76 | HR 63 | Ht 72.0 in | Wt 183.4 lb

## 2022-06-18 DIAGNOSIS — Z86010 Personal history of colonic polyps: Secondary | ICD-10-CM

## 2022-06-18 DIAGNOSIS — I2510 Atherosclerotic heart disease of native coronary artery without angina pectoris: Secondary | ICD-10-CM

## 2022-06-18 DIAGNOSIS — Z7901 Long term (current) use of anticoagulants: Secondary | ICD-10-CM

## 2022-06-18 DIAGNOSIS — Z860101 Personal history of adenomatous and serrated colon polyps: Secondary | ICD-10-CM

## 2022-06-18 MED ORDER — NA SULFATE-K SULFATE-MG SULF 17.5-3.13-1.6 GM/177ML PO SOLN: 1.0000 | Freq: Once | ORAL | 0 refills | Status: AC

## 2022-06-18 NOTE — Patient Instructions (Signed)
_______________________________________________________  If you are age 72 or older, your body mass index should be between 23-30. Your Body mass index is 24.87 kg/m. If this is out of the aforementioned range listed, please consider follow up with your Primary Care Provider.  If you are age 20 or younger, your body mass index should be between 19-25. Your Body mass index is 24.87 kg/m. If this is out of the aformentioned range listed, please consider follow up with your Primary Care Provider.   ________________________________________________________  The [place] [facility] providers would like to encourage you to use [facility] to communicate with providers for non-urgent requests or questions.  Due to long hold times on the telephone, sending your provider a message by [facility] may be a faster and more efficient way to get a response.  Please allow 48 business hours for a response.  Please remember that this is for non-urgent requests.  _______________________________________________________  Dennis Bast have been scheduled for a colonoscopy. Please follow written instructions given to you at your visit today.  Please pick up your prep supplies at the pharmacy within the next 1-3 days. If you use inhalers (even only as needed), please bring them with you on the day of your procedure.  Due to recent changes in healthcare laws, you may see the results of your imaging and laboratory studies on MyChart before your provider has had a chance to review them.  We understand that in some cases there may be results that are confusing or concerning to you. Not all laboratory results come back in the same time frame and the provider may be waiting for multiple results in order to interpret others.  Please give [place] 48 hours in order for your provider to thoroughly review all the results before contacting the office for clarification of your results.   You will be contacted by our office prior to your procedure for directions on  holding your Brilinta  If you do not hear from our office 1 week prior to your scheduled procedure, please call [phone] to discuss.    It was a pleasure to see you today!  Thank you for trusting me with your gastrointestinal care!

## 2022-06-18 NOTE — Telephone Encounter (Signed)
[facility] Pre-operative Risk Assessment     Request for surgical clearance:     Endoscopy Procedure  What type of surgery is being performed?     Colonoscopy  When is this surgery scheduled?     07-22-2022  What type of clearance is required ?   Pharmacy  Are there any medications that need to be held prior to surgery and how long? Yes, Brilinta, 5 day hold  Practice name and name of physician performing surgery?      [facility]  What is your office phone and fax number?      Phone- [phone]  [phone]  Anesthesia type (None, local, MAC, general) ?       MAC

## 2022-06-18 NOTE — Telephone Encounter (Signed)
   Name: [name]  DOB: 12-09-1950  MRN: [id number]  Primary Cardiologist: None  Chart reviewed as part of pre-operative protocol coverage. The patient has an upcoming visit scheduled with Dr. Haroldine Laws on 07/07/2022 at which time clearance can be addressed in case there are any issues that would impact surgical recommendations (request to hold Brilinta prior to procedure).  Colonoscopy is not scheduled until 07/22/2022 as below. I added preop FYI to appointment note so that provider is aware to address at time of outpatient visit.  Per office protocol the cardiology provider should forward their finalized clearance decision and recommendations regarding antiplatelet therapy to the requesting party below.    I will route this message as FYI to requesting party and remove this message from the preop box as separate preop APP input not needed at this time.   Please call with any questions.  Lenna Sciara, NP  06/18/2022, 12:38 PM

## 2022-06-18 NOTE — Progress Notes (Signed)
Chief Complaint: Discuss colonoscopy on chronic anticoagulation  HPI:    [name] is a 71 year old male with a past medical history as listed below including GERD, STEMI and cardiac arrest 4/22 and underwent emergent cardiac cath and PCI with stent to the proximal RCA, heart failure (06/10/2021 echo with LVEF 55-60% and no aortic stenosis), psoriasis and multiple others listed below, known to Dr. Havery Moros, who was referred to me by [name], MD for consideration of repeat colonoscopy on chronic anticoagulation.    07/03/2017 colonoscopy for screening with one 5 mm polyp in the cecum removed, diverticulosis in the sigmoid colon and internal hemorrhoids.  Pathology showed sessile serrated polyp.  Repeat colonoscopy recommended in 5 years.   12/31/21 office visit with Dr. Haroldine Laws for establish CHF.  That time told to follow-up in 6 months with repeat echocardiogram.     06/11/2022 CBC with platelets minimally decreased at 148 and otherwise normal.  BMP with a creatinine of 1.69 and otherwise normal.    Today, patient presents to clinic and tells me he knows he is due for a colonoscopy.  Does discuss that he had a heart attack last April and was started on a blood thinner at that time.  He has been doing well though and his heart function has improved even at time of his last echo in September.  He denies any acute GI complaints or concerns.    Denies fever, chills, weight loss, change in bowel habits, abdominal pain, heartburn or reflux.  Past Medical History:  Diagnosis Date   Anoxic brain injury ([place])    Anxiety    Arthritis    Cancer ([place]) 2010   basal cell ca, head   Cataract    bilateral   Depression    ED (erectile dysfunction)    GERD (gastroesophageal reflux disease)    Heart failure ([place])    Hyperlipidemia    Hypertension    Kidney stone 07/29/2011   passed    Neuromuscular disorder ([place])    neuropathy legs   Psoriasis    Vitreous floater    left eye, sees Dr.  Dawna Part at [facility]     Past Surgical History:  Procedure Laterality Date   BASAL CELL CARCINOMA EXCISION     removed from scalp   COLONOSCOPY  07/03/2017   per Dr. Havery Moros, sessile serrated polyp, repeat in 5 yrs    [place] CATH AND CORONARY ANGIOGRAPHY N/A 01/03/2021   Procedure: LEFT HEART CATH AND CORONARY ANGIOGRAPHY;  Surgeon: Troy Sine, MD;  Location: [facility];  Service: Cardiovascular;  Laterality: N/A;   RIGHT HEART CATH N/A 01/03/2021   Procedure: RIGHT HEART CATH;  Surgeon: Jolaine Artist, MD;  Location: [facility];  Service: Cardiovascular;  Laterality: N/A;    Current Outpatient Medications  Medication Sig Dispense Refill   aspirin 81 MG chewable tablet Chew 1 tablet (81 mg total) by mouth daily. 90 tablet 0   BRILINTA 90 MG TABS tablet TAKE ONE TABLET BY MOUTH EVERY MORNING and TAKE ONE TABLET BY MOUTH EVERY EVENING 120 tablet 1   carvedilol (COREG) 3.125 MG tablet TAKE ONE TABLET BY MOUTH EVERY MORNING and TAKE ONE TABLET BY MOUTH EVERY EVENING 180 tablet 1   Cholecalciferol (VITAMIN D) 50 MCG (2000 UT) CAPS Take 1 capsule by mouth daily.     diclofenac Sodium (VOLTAREN) 1 % GEL Apply 2 g topically 2 (two) times daily  as needed (pain).     DULoxetine (CYMBALTA) 20 MG capsule Take 1 capsule (20 mg total) by mouth daily. 90 capsule 3   ENTRESTO 24-26 MG TAKE ONE TABLET BY MOUTH twice daily 60 tablet 11   FARXIGA 10 MG TABS tablet TAKE ONE TABLET BY MOUTH BEFORE BREAKFAST 90 tablet 3   finasteride ([name]) 5 MG tablet Take 1 tablet (5 mg total) by mouth daily. 90 tablet 3   ketoconazole (NIZORAL) 2 % cream Apply 1 application topically daily as needed for irritation.     LORazepam (ATIVAN) 2 MG tablet Take 1 tablet (2 mg total) by mouth every 6 (six) hours as needed for anxiety. 120 tablet 5   melatonin 3 MG TABS tablet Take 1 tablet by mouth daily.     nitroGLYCERIN (NITROSTAT) 0.4 MG SL tablet Place 1 tablet  (0.4 mg total) under the tongue every 5 (five) minutes as needed for chest pain. 50 tablet 3   omeprazole (PRILOSEC) 20 MG capsule TAKE ONE CAPSULE BY MOUTH daily as needed 30 capsule 11   rosuvastatin (CRESTOR) 10 MG tablet TAKE ONE TABLET BY MOUTH ONCE DAILY 90 tablet 11   sildenafil (REVATIO) 20 MG tablet Take 20 mg by mouth as needed.     triamcinolone cream (KENALOG) 0.1 % Apply 1 application topically 2 (two) times daily as needed (psoriasis).     No current facility-administered medications for this visit.    Allergies as of 06/18/2022 - Review Complete 06/18/2022  Allergen Reaction Noted   Codeine Other (See Comments) 12/23/2010   Lisinopril Cough 08/15/2019    Family History  Problem Relation Age of Onset   Hypertension Mother    Hyperlipidemia Mother    Depression Other    Diabetes Other    Hyperlipidemia Other    Hypertension Other    Alzheimer's disease Other    Colon cancer Neg Hx     Social History   Socioeconomic History   Marital status: Single    Spouse name: Not on file   Number of children: Not on file   Years of education: Not on file   Highest education level: Not on file  Occupational History   Occupation: retired    Comment: Pharmacist, hospital  Tobacco Use   Smoking status: Never   Smokeless tobacco: Never  Vaping Use   Vaping Use: Never used  Substance and Sexual Activity   Alcohol use: No    Alcohol/week: 0.0 standard drinks of alcohol   Drug use: No   Sexual activity: Not on file  Other Topics Concern   Not on file  Social History Narrative   Not on file   Social Determinants of Health   Financial Resource Strain: Low Risk  (06/04/2022)   Overall Financial Resource Strain (CARDIA)    Difficulty of Paying Living Expenses: Not hard at all  Food Insecurity: No Food Insecurity (12/09/2021)   Hunger Vital Sign    Worried About Running Out of Food in the Last Year: Never true    [place] in the Last Year: Never true  Transportation Needs: No  Transportation Needs (12/09/2021)   PRAPARE - Hydrologist (Medical): No    Lack of Transportation (Non-Medical): No  Physical Activity: Sufficiently Active (12/09/2021)   Exercise Vital Sign    Days of Exercise per Week: 7 days    Minutes of Exercise per Session: 90 min  Stress: Stress Concern Present (12/09/2021)   [employer] - Occupational Stress Questionnaire    Feeling of Stress : To some extent  Social Connections: Socially Isolated (12/28/2020)   Social Connection and Isolation Panel [NHANES]    Frequency of Communication with Friends and Family: More than three times a week    Frequency of Social Gatherings with Friends and Family: More than three times a week    Attends Religious Services: Never    Marine scientist or Organizations: No    Attends Archivist Meetings: Never    Marital Status: Never married  Intimate Partner Violence: Not At Risk (12/28/2020)   Humiliation, Afraid, Rape, and Kick questionnaire    Fear of Current or Ex-Partner: No    Emotionally Abused: No    Physically Abused: No    Sexually Abused: No    Review of Systems:    Constitutional: No weight loss, fever or chills Cardiovascular: No chest pain Respiratory: No SOB  Gastrointestinal: See HPI and otherwise negative Genitourinary: No dysuria  Neurological: No headache, dizziness or syncope Musculoskeletal: No new muscle or joint pain Hematologic: No bleeding  Psychiatric: No history of depression or anxiety   Physical Exam:  Vital signs: BP 132/76   Pulse 63   Ht 6' (1.829 m)   Wt 183 lb 6 oz (83.2 kg)   BMI 24.87 kg/m    Constitutional:   Pleasant Caucasian male appears to be in NAD, Well developed, Well nourished, alert and cooperative Head:  Normocephalic and atraumatic. Eyes:   PEERL, EOMI. No icterus. Conjunctiva pink. Ears:  Normal auditory acuity. Neck:  Supple Throat: Oral cavity and pharynx without  inflammation, swelling or lesion.  Respiratory: Respirations even and unlabored. Lungs clear to auscultation bilaterally.   No wheezes, crackles, or rhonchi.  Cardiovascular: Normal S1, S2. No MRG. Regular rate and rhythm. No peripheral edema, cyanosis or pallor.  Gastrointestinal:  Soft, nondistended, nontender. No rebound or guarding. Normal bowel sounds. No appreciable masses or hepatomegaly. Rectal:  Not performed.  Msk:  Symmetrical without gross deformities. Without edema, no deformity or joint abnormality.  Neurologic:  Alert and  oriented x4;  grossly normal neurologically.  Skin:   Dry and intact without significant lesions or rashes. Psychiatric: Demonstrates good judgement and reason without abnormal affect or behaviors.  RELEVANT LABS AND IMAGING: CBC    Component Value Date/Time   WBC 5.5 06/11/2022 0848   RBC 4.50 06/11/2022 0848   HGB 14.3 06/11/2022 0848   HCT 42.1 06/11/2022 0848   PLT 148.0 (L) 06/11/2022 0848   MCV 93.4 06/11/2022 0848   MCH 31.3 06/18/2021 1447   MCHC 34.0 06/11/2022 0848   RDW 13.4 06/11/2022 0848   LYMPHSABS 1.2 06/11/2022 0848   MONOABS 0.6 06/11/2022 0848   EOSABS 0.2 06/11/2022 0848   BASOSABS 0.0 06/11/2022 0848    CMP     Component Value Date/Time   NA 136 06/11/2022 0848   NA 139 10/25/2019 1455   K 4.0 06/11/2022 0848   CL 102 06/11/2022 0848   CO2 26 06/11/2022 0848   GLUCOSE 96 06/11/2022 0848   BUN 43 (H) 06/11/2022 0848   BUN 14 10/25/2019 1455   CREATININE 1.69 (H) 06/11/2022 0848   CREATININE 1.12 05/24/2020 0829   CALCIUM 9.4 06/11/2022 0848   PROT 7.2 06/11/2022 0848   PROT 6.7 11/16/2019 0859   ALBUMIN 4.3 06/11/2022 0848   ALBUMIN 4.2 11/16/2019 0859   AST 23 06/11/2022 0848   ALT 29 06/11/2022 0848   ALKPHOS 34 (L)  06/11/2022 0848   BILITOT 0.6 06/11/2022 0848   BILITOT 0.4 11/16/2019 0859   GFRNONAA 47 (L) 12/31/2021 1232   GFRAA 81 10/25/2019 1455    Assessment: 1.  History of sessile serrated polyp:  On colonoscopy in 2018 with repeat recommended 5 years, patient due now 2.  History of heart attack April 2022 status post stent: On Brilinta, normal EF  Plan: 1.  Scheduled patient for a surveillance colonoscopy in the [place] with Dr. Havery Moros.  Did provide the patient a detailed list of risks for the procedure and he agrees to proceed. Patient is appropriate for endoscopic procedure(s) in the ambulatory ([place]) setting.  2.  Patient was advised to hold his Brilinta for 5 days prior to time of procedure.  We will communicate this with his prescribing physician to ensure this is acceptable for him. 3.  Patient to follow in clinic per recommendations from Dr. Havery Moros after time of procedure.  Ellouise Newer, PA-C [facility] 06/18/2022, 10:31 AM  Cc: [name], MD

## 2022-06-19 NOTE — Telephone Encounter (Signed)
Will await communication from the office visit.

## 2022-06-21 DIAGNOSIS — N400 Enlarged prostate without lower urinary tract symptoms: Secondary | ICD-10-CM | POA: Diagnosis not present

## 2022-06-21 DIAGNOSIS — I5020 Unspecified systolic (congestive) heart failure: Secondary | ICD-10-CM | POA: Diagnosis not present

## 2022-06-21 DIAGNOSIS — F32A Depression, unspecified: Secondary | ICD-10-CM

## 2022-06-21 DIAGNOSIS — I2510 Atherosclerotic heart disease of native coronary artery without angina pectoris: Secondary | ICD-10-CM | POA: Diagnosis not present

## 2022-06-21 DIAGNOSIS — I10 Essential (primary) hypertension: Secondary | ICD-10-CM

## 2022-06-21 DIAGNOSIS — E785 Hyperlipidemia, unspecified: Secondary | ICD-10-CM | POA: Diagnosis not present

## 2022-06-21 NOTE — Progress Notes (Signed)
Agree with assessment and plan as outlined.  

## 2022-07-02 ENCOUNTER — Ambulatory Visit (HOSPITAL_COMMUNITY)
Admission: RE | Admit: 2022-07-02 | Discharge: 2022-07-02 | Disposition: A | Discharge status: Home / Self Care | Payer: Medicare PPO | Source: Ambulatory Visit | Attending: Family Medicine | Admitting: Family Medicine

## 2022-07-02 DIAGNOSIS — R911 Solitary pulmonary nodule: Secondary | ICD-10-CM | POA: Diagnosis not present

## 2022-07-02 DIAGNOSIS — R918 Other nonspecific abnormal finding of lung field: Secondary | ICD-10-CM | POA: Diagnosis not present

## 2022-07-02 DIAGNOSIS — J432 Centrilobular emphysema: Secondary | ICD-10-CM | POA: Diagnosis not present

## 2022-07-03 ENCOUNTER — Telehealth (HOSPITAL_COMMUNITY): Payer: Self-pay | Admitting: *Deleted

## 2022-07-03 NOTE — Telephone Encounter (Signed)
Left detailed vm informing pt that auth for echo was approved .

## 2022-07-07 ENCOUNTER — Telehealth: Payer: Self-pay | Admitting: Pharmacist

## 2022-07-07 ENCOUNTER — Other Ambulatory Visit: Payer: Self-pay | Admitting: Family Medicine

## 2022-07-07 ENCOUNTER — Ambulatory Visit (HOSPITAL_COMMUNITY)
Admission: RE | Admit: 2022-07-07 | Discharge: 2022-07-07 | Disposition: A | Discharge status: Home / Self Care | Payer: Medicare PPO | Source: Ambulatory Visit | Attending: Internal Medicine | Admitting: Internal Medicine

## 2022-07-07 ENCOUNTER — Ambulatory Visit (HOSPITAL_BASED_OUTPATIENT_CLINIC_OR_DEPARTMENT_OTHER)
Admission: RE | Admit: 2022-07-07 | Discharge: 2022-07-07 | Disposition: A | Discharge status: Home / Self Care | Payer: Medicare PPO | Source: Ambulatory Visit | Attending: Internal Medicine | Admitting: Internal Medicine

## 2022-07-07 VITALS — BP 150/90 | HR 63 | Wt 183.0 lb

## 2022-07-07 DIAGNOSIS — I5022 Chronic systolic (congestive) heart failure: Secondary | ICD-10-CM

## 2022-07-07 DIAGNOSIS — N1832 Chronic kidney disease, stage 3b: Secondary | ICD-10-CM | POA: Insufficient documentation

## 2022-07-07 DIAGNOSIS — E785 Hyperlipidemia, unspecified: Secondary | ICD-10-CM | POA: Diagnosis not present

## 2022-07-07 DIAGNOSIS — Z7982 Long term (current) use of aspirin: Secondary | ICD-10-CM | POA: Diagnosis not present

## 2022-07-07 DIAGNOSIS — I2510 Atherosclerotic heart disease of native coronary artery without angina pectoris: Secondary | ICD-10-CM | POA: Insufficient documentation

## 2022-07-07 DIAGNOSIS — I130 Hypertensive heart and chronic kidney disease with heart failure and stage 1 through stage 4 chronic kidney disease, or unspecified chronic kidney disease: Secondary | ICD-10-CM | POA: Insufficient documentation

## 2022-07-07 DIAGNOSIS — I252 Old myocardial infarction: Secondary | ICD-10-CM | POA: Insufficient documentation

## 2022-07-07 DIAGNOSIS — I10 Essential (primary) hypertension: Secondary | ICD-10-CM | POA: Diagnosis not present

## 2022-07-07 LAB — ECHOCARDIOGRAM COMPLETE
Area-P 1/2: 2.72 cm2
S' Lateral: 2.5 cm

## 2022-07-07 NOTE — Patient Instructions (Signed)
CONGRATULATIONS YOU HAVE GRADUATED FROM THE HEART FAILURE CLINIC.  Stop Brilinta  You have been referred to Dr. Audie Box at [facility]. His office will reach out to arrange your appointment.  If you have any questions or concerns before your next appointment please send [place] a message through [place] or call our office at [phone].    TO LEAVE A MESSAGE FOR THE NURSE SELECT OPTION 2, PLEASE LEAVE A MESSAGE INCLUDING: YOUR NAME DATE OF BIRTH CALL BACK NUMBER REASON FOR CALL**this is important as we prioritize the call backs  YOU WILL RECEIVE A CALL BACK THE SAME DAY AS LONG AS YOU CALL BEFORE 4:00 PM  At the [facility], you and your health needs are our priority. As part of our continuing mission to provide you with exceptional heart care, we have created designated Provider Care Teams. These Care Teams include your primary Cardiologist (physician) and Advanced Practice Providers (APPs- Physician Assistants and Nurse Practitioners) who all work together to provide you with the care you need, when you need it.   You may see any of the following providers on your designated Care Team at your next follow up: Dr Glori Bickers Dr Loralie Champagne Dr. Roxana Hires, NP Lyda Jester, [place] [facility] [place], [place] Forestine Na, NP Audry Riles, PharmD   Please be sure to bring in all your medications bottles to every appointment.

## 2022-07-07 NOTE — Progress Notes (Signed)
Advanced Heart Failure Clinic Note   PCP: Dr. [name] Penna HF Cardiologist: Dr. Haroldine Laws  HPI:  [name] is a 71 year old male with past medical history of hypertension, hyperlipidemia, VF arrest, CAD, and systolic heart failure.  He was admitted 4/22 with STEMI and cardiac arrest prior to coming to hospital.  He underwent emergent cardiac catheterization and PCI with stent to proximal RCA. EF 25 to 30%.  Patient did have prolonged ICU stay for cardiogenic/hemorrhagic shock requiring vasopressors, Impella and transfusion.  He suffered from diminished mental status and MRI showed scattered punctate infarction concerning for anoxic injury.  He had AKI requiring CRRT, then transitioned to [facility].  Palliative care was consulted and made DNR.  He was discharged to home on oxygen with Home Health, discharge weight 175.5 lbs.  Arlyce Harman, Imdur and hydralazine stopped 9/22 with issues with orthostasis and hyperkalemia. Had acute epididmyitis/orchitis with WBC 21k, followed up with Urology and resolved.  Echo 9/22 EF 55-60% RV mildly HK.  Follow up 11/22, doing well with CR. Stable NYHA II, euvolemic.  Today he returns for HF follow up. Says over the past 3 weeks energy levels and appetite not as good. No CP, edema, orthopnea or PND. Complaint with meds. SBP 120-130 at home (checks regularly)  Echo today 07/07/22: EF 55-60% RV ok  Cardiac Studies: - Echo (9/22): EF 55-60%, RV mildly HK - Echo (4/22): EF 25-30%, severe LV dysfunction, RV moderately reduced  - LHC (4/22): Ost LAD to Prox LAD lesion is 50% stenosed. Mid LAD lesion is 20% stenosed. Prox RCA-1 lesion is 100% stenosed, s/p PCI+DES Prox RCA-2 lesion is 50% stenosed.  - RHC (4/22): On NE and Impella RA = 4 RV = 19/3 PA = 20/10 (11) PCW = 7 Fick cardiac output/index = 2.6/1.7 FA sat = 100% PA sat = 54%  ROS: All systems negative except as listed in HPI, PMH and Problem List.  SH:  Social History   Socioeconomic  History   Marital status: Single    Spouse name: Not on file   Number of children: Not on file   Years of education: Not on file   Highest education level: Not on file  Occupational History   Occupation: retired    Comment: Pharmacist, hospital  Tobacco Use   Smoking status: Never   Smokeless tobacco: Never  Vaping Use   Vaping Use: Never used  Substance and Sexual Activity   Alcohol use: No    Alcohol/week: 0.0 standard drinks of alcohol   Drug use: No   Sexual activity: Not on file  Other Topics Concern   Not on file  Social History Narrative   Not on file   Social Determinants of Health   Financial Resource Strain: Low Risk  (06/04/2022)   Overall Financial Resource Strain (CARDIA)    Difficulty of Paying Living Expenses: Not hard at all  Food Insecurity: No Food Insecurity (12/09/2021)   Hunger Vital Sign    Worried About Running Out of Food in the Last Year: Never true    Ran Out of Food in the Last Year: Never true  Transportation Needs: No Transportation Needs (12/09/2021)   PRAPARE - Hydrologist (Medical): No    Lack of Transportation (Non-Medical): No  Physical Activity: Sufficiently Active (12/09/2021)   Exercise Vital Sign    Days of Exercise per Week: 7 days    Minutes of Exercise per Session: 90 min  Stress: Stress Concern Present (12/09/2021)  [place] Questionnaire    Feeling of Stress : To some extent  Social Connections: Socially Isolated (12/28/2020)   Social Connection and Isolation Panel [NHANES]    Frequency of Communication with Friends and Family: More than three times a week    Frequency of Social Gatherings with Friends and Family: More than three times a week    Attends Religious Services: Never    Marine scientist or Organizations: No    Attends Archivist Meetings: Never    Marital Status: Never married  Intimate Partner Violence: Not At Risk (12/28/2020)    Humiliation, Afraid, Rape, and Kick questionnaire    Fear of Current or Ex-Partner: No    Emotionally Abused: No    Physically Abused: No    Sexually Abused: No   FH:  Family History  Problem Relation Age of Onset   Hypertension Mother    Hyperlipidemia Mother    Depression Other    Diabetes Other    Hyperlipidemia Other    Hypertension Other    Alzheimer's disease Other    Colon cancer Neg Hx    Past Medical History:  Diagnosis Date   Anoxic brain injury ([place])    Anxiety    Arthritis    Cancer ([place]) 2010   basal cell ca, head   Cataract    bilateral   Depression    ED (erectile dysfunction)    GERD (gastroesophageal reflux disease)    Heart failure ([place])    Hyperlipidemia    Hypertension    Kidney stone 07/29/2011   passed    Neuromuscular disorder (HCC)    neuropathy legs   Psoriasis    Vitreous floater    left eye, sees Dr. Dawna Part at [facility]    Current Outpatient Medications  Medication Sig Dispense Refill   aspirin 81 MG chewable tablet Chew 1 tablet (81 mg total) by mouth daily. 90 tablet 0   BRILINTA 90 MG TABS tablet TAKE ONE TABLET BY MOUTH EVERY MORNING and TAKE ONE TABLET BY MOUTH EVERY EVENING 120 tablet 1   carvedilol (COREG) 3.125 MG tablet TAKE ONE TABLET BY MOUTH EVERY MORNING and TAKE ONE TABLET BY MOUTH EVERY EVENING 180 tablet 1   Cholecalciferol (VITAMIN D) 50 MCG (2000 UT) CAPS Take 1 capsule by mouth daily.     diclofenac Sodium (VOLTAREN) 1 % GEL Apply 2 g topically 2 (two) times daily as needed (pain).     DULoxetine (CYMBALTA) 20 MG capsule Take 1 capsule (20 mg total) by mouth daily. 90 capsule 3   ENTRESTO 24-26 MG TAKE ONE TABLET BY MOUTH twice daily 60 tablet 11   FARXIGA 10 MG TABS tablet TAKE ONE TABLET BY MOUTH BEFORE BREAKFAST 90 tablet 3   finasteride ([name]) 5 MG tablet Take 1 tablet (5 mg total) by mouth daily. 90 tablet 3   ketoconazole (NIZORAL) 2 % cream Apply 1 application topically daily as needed for  irritation.     LORazepam (ATIVAN) 2 MG tablet Take 1 tablet (2 mg total) by mouth every 6 (six) hours as needed for anxiety. 120 tablet 5   melatonin 3 MG TABS tablet Take 1 tablet by mouth daily.     nitroGLYCERIN (NITROSTAT) 0.4 MG SL tablet Place 1 tablet (0.4 mg total) under the tongue every 5 (five) minutes as needed for chest pain. 50 tablet 3   omeprazole (PRILOSEC) 20 MG capsule TAKE ONE CAPSULE BY MOUTH daily  as needed 30 capsule 11   rosuvastatin (CRESTOR) 10 MG tablet TAKE ONE TABLET BY MOUTH ONCE DAILY 90 tablet 11   sildenafil (REVATIO) 20 MG tablet Take 20 mg by mouth as needed.     triamcinolone cream (KENALOG) 0.1 % Apply 1 application topically 2 (two) times daily as needed (psoriasis).     No current facility-administered medications for this encounter.   BP (!) 150/90   Pulse 63   Wt 83 kg (183 lb)   SpO2 97%   BMI 24.82 kg/m   Wt Readings from Last 3 Encounters:  07/07/22 83 kg (183 lb)  06/18/22 83.2 kg (183 lb 6 oz)  06/11/22 81.6 kg (180 lb)   PHYSICAL EXAM: General:  Well appearing. No resp difficulty HEENT: normal Neck: supple. no JVD. Carotids 2+ bilat; no bruits. No lymphadenopathy or thryomegaly appreciated. Cor: PMI nondisplaced. Regular rate & rhythm. No rubs, gallops or murmurs. Lungs: clear Abdomen: soft, nontender, nondistended. No hepatosplenomegaly. No bruits or masses. Good bowel sounds. Extremities: no cyanosis, clubbing, rash, edema Neuro: alert & orientedx3, cranial nerves grossly intact. moves all 4 extremities w/o difficulty. Affect pleasant  ASSESSMENT & PLAN:   1. CAD - VF arrest in setting of acute MI. Emergent cath (4/22) w/ pRCA occlusion s/p PCI + DES. - No s/s angina - Continue ASA 81 mg daily. - Continue Crestor 10 mg daily. - Now > 1 year out from PCI can stop Brilinta  2. Chronic Systolic Heart Failure  - ICM. Echo (4/22) LVEF 20-25%, RV moderately reduced. - Echo (9/22): EF 55-60% RV mildly reduced.  - Echo today  07/07/22: EF 55-60% RV ok  - NYHA I. Volume status looks good today. - Continue Entresto 24/26 mg bid. - Continue Farxiga 10 mg daily. No current GU symptoms. - Continue carvedilol 3.125 mg bid.  - Off spiro, imdur, hydralazine w/ orthostasis + hyperkalemia.  3. CKD IIIb - Baseline SCr ~1.8-2.2. - Follows with Dr. Carolin Sicks. - Last SCr 1.69 in 06/11/22   4. Anoxic brain injury - Resolved  5. HTN - BP high here but well controlled at home - Continue current regimen  EF now normalized. Can graduate HF program and f/u General Cardiology.  Glori Bickers, MD  2:12 PM

## 2022-07-07 NOTE — Addendum Note (Signed)
Encounter addended by: Jerl Mina, RN on: 07/07/2022 2:30 PM  Actions taken: Clinical Note Signed, Visit diagnoses modified, Order list changed, Diagnosis association updated

## 2022-07-07 NOTE — Chronic Care Management (AMB) (Unsigned)
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 No medication changes. No follow up noted.  Recent consult visits:  06/18/2022 Ellouise Newer PA (GI) - Patient was seen for History of adenomatous polyp of colon and additional concerns. No medication changes. No follow up noted.   Hospital visits:  None  Medications: Outpatient Encounter Medications as of 07/07/2022  Medication Sig Note   aspirin 81 MG chewable tablet Chew 1 tablet (81 mg total) by mouth daily.    carvedilol (COREG) 3.125 MG tablet TAKE ONE TABLET BY MOUTH EVERY MORNING and TAKE ONE TABLET BY MOUTH EVERY EVENING    Cholecalciferol (VITAMIN D) 50 MCG (2000 UT) CAPS Take 1 capsule by mouth daily.    diclofenac Sodium (VOLTAREN) 1 % GEL Apply 2 g topically 2 (two) times daily as needed (pain).    DULoxetine (CYMBALTA) 20 MG capsule Take 1 capsule (20 mg total) by mouth daily.    ENTRESTO 24-26 MG TAKE ONE TABLET BY MOUTH twice daily    FARXIGA 10 MG TABS tablet TAKE ONE TABLET BY MOUTH BEFORE BREAKFAST    finasteride ([name]) 5 MG tablet Take 1 tablet (5 mg total) by mouth daily.    ketoconazole (NIZORAL) 2 % cream Apply 1 application topically daily as needed for irritation. 03/20/2022: prn   LORazepam (ATIVAN) 2 MG tablet Take 1 tablet (2 mg total) by mouth every 6 (six) hours as needed for anxiety.    melatonin 3 MG TABS tablet Take 1 tablet by mouth daily.    nitroGLYCERIN (NITROSTAT) 0.4 MG SL tablet Place 1 tablet (0.4 mg total) under the tongue every 5 (five) minutes as needed for chest pain.    omeprazole (PRILOSEC) 20 MG capsule TAKE ONE CAPSULE BY MOUTH daily as needed    rosuvastatin  (CRESTOR) 10 MG tablet TAKE ONE TABLET BY MOUTH ONCE DAILY    sildenafil (REVATIO) 20 MG tablet Take 20 mg by mouth as needed.    triamcinolone cream (KENALOG) 0.1 % Apply 1 application topically 2 (two) times daily as needed (psoriasis).    [DISCONTINUED] tadalafil (CIALIS) 20 MG tablet Take 20 mg by mouth daily as needed.      No facility-administered encounter medications on file as of 07/07/2022.   Reviewed chart for medication changes ahead of medication coordination call.  BP Readings from Last 3 Encounters:  07/07/22 (!) 150/90  06/18/22 132/76  06/11/22 112/70    Lab Results  Component Value Date   HGBA1C 5.8 06/11/2022     Patient obtains medications through Adherence Packaging  30 Days    Last adherence pickup included:  Farxiga 10 mg: one tablet before breakfast Rosuvastatin 10 mg: one tablet at breakfast Finasteride  5 mg: one tablet before breakfast Carvedilol  3.125 mg: 1 tablet at breakfast and 1 tablet at dinner Brilinta 90 mg: 1 tablet at breakfast and 1 tablet at dinner   Patient declined (meds) last month:   Entresto 24/26 mg - 1 tablet twice daily (90 DS) not due until 08/07/2022 Omeprazole 20 mg  - 1 tablet daily as needed (vial)   Next adherence delivery scheduled on: 07/18/2022   Called patient and reviewed medications and coordinated delivery.  This delivery to include: Farxiga 10 mg: one tablet before breakfast Rosuvastatin 10 mg: one tablet at breakfast Finasteride  5 mg: one tablet before breakfast Carvedilol  3.125 mg: 1 tablet at breakfast and 1 tablet at dinner Brilinta 90 mg: 1 tablet at breakfast and 1 tablet at dinner   Patient will need a short fill: Patient declines short fills   Coordinated acute fill: Patient declines acute fills   Patient declined the following medications: Entresto 24/26 mg - 1 tablet twice daily (90 DS) not due until 08/07/2022 Omeprazole 20 mg  - 1 tablet daily as needed (vial)   Confirmed delivery date of  07/18/2022, advised patient that pharmacy will contact them the morning of delivery.   Care Gaps: AWV - completed 12/09/2021 Last BP - 132/76 on 06/18/2022 Last A1C - 5.8 on 06/11/2022 Foot exam - never done  Urine ACR - never done Colonoscopy - overdue Covid - postponed   Star Rating Drugs: Farxiga 10 mg - last filled 06/12/2022 30 DS at [facility] Rosuvastatin 10 mg - last filled 06/12/2022 30 DS at [facility] [phone]

## 2022-07-12 IMAGING — DX DG CHEST 1V PORT
1 series · 1 of 1 positions shown · non-contrast
Comparison: Chest x-rays dated 01/04/2021 and 01/03/2021.

CLINICAL DATA: Hypoxia.  Hypertension.

EXAM:
PORTABLE CHEST 1 VIEW

[chest]
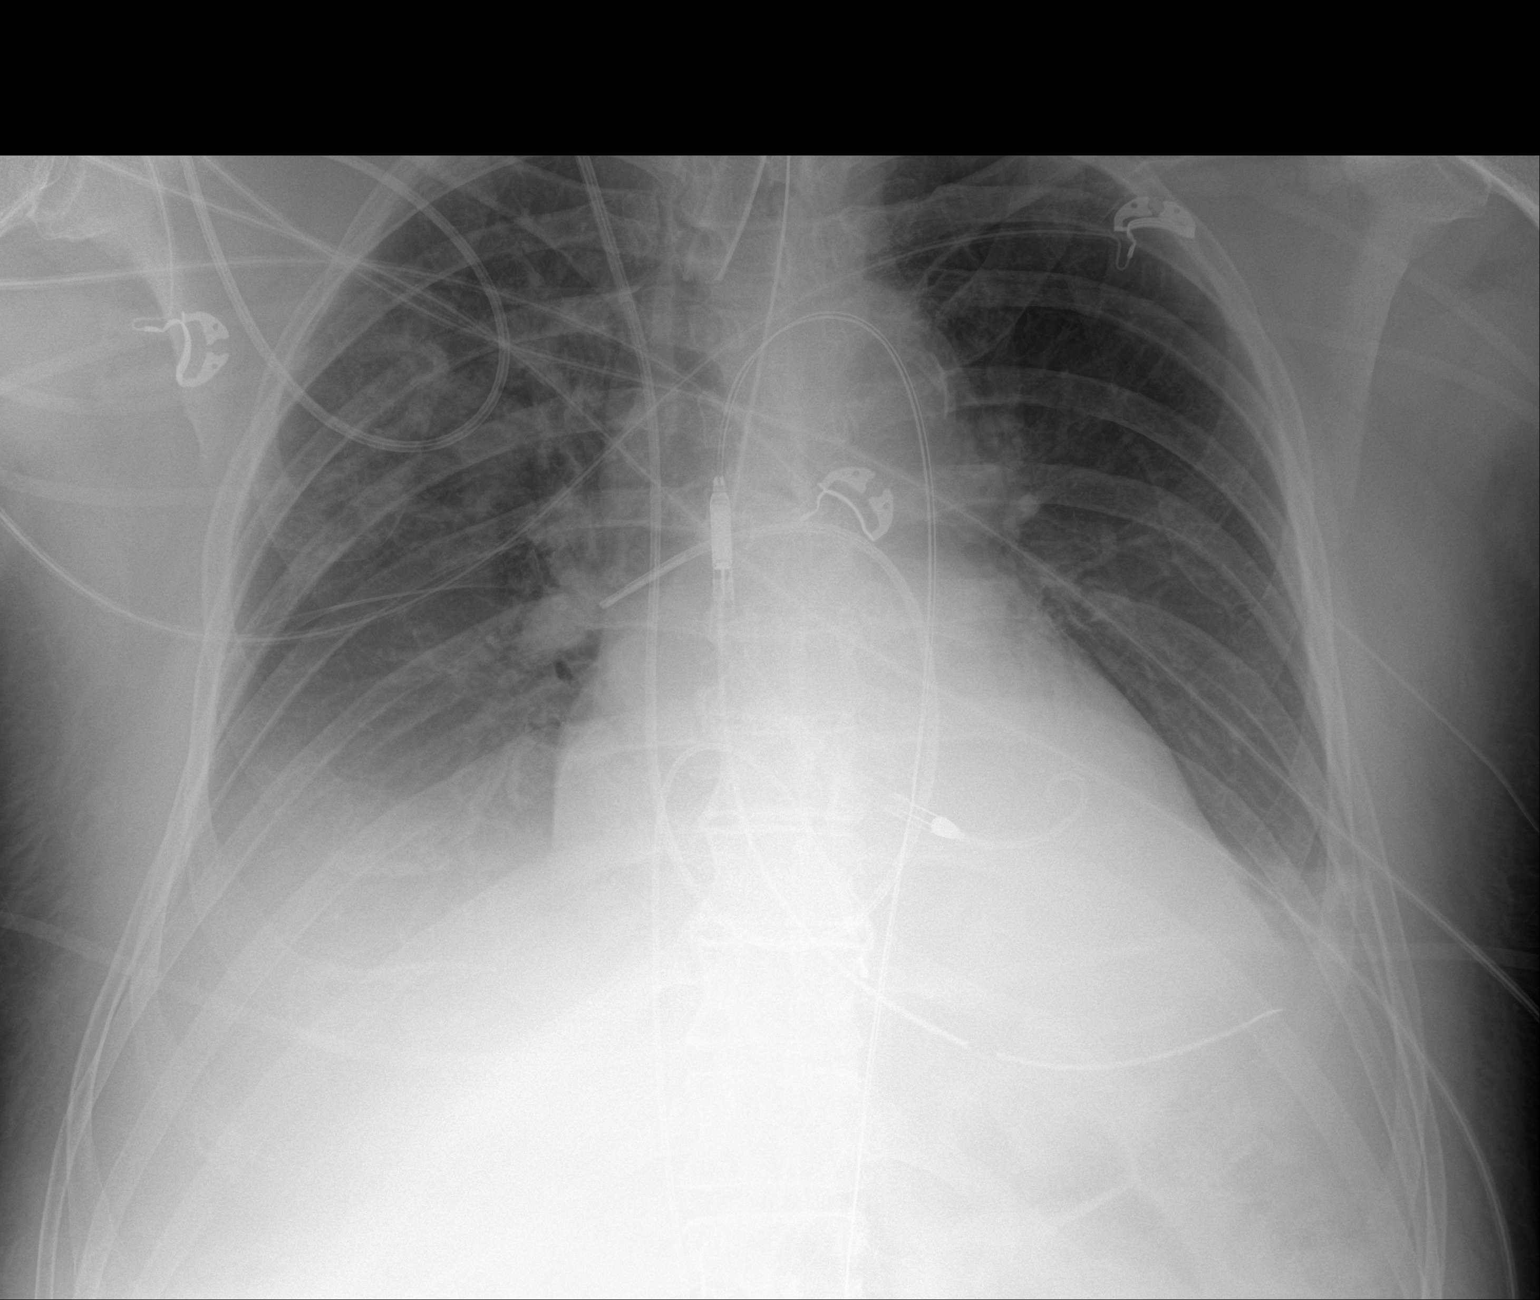

[1 of 1 positions shown; findings below may reference images not displayed]

FINDINGS: Endotracheal tube appears adequately positioned with tip
approximately 5 cm above the carina. Swan-Ganz catheter in place
with tip just to the RIGHT of midline. Enteric tube passes below the
diaphragm.

Hazy opacities of bilateral lung bases, likely mild atelectasis
and/or small pleural effusions. Lungs otherwise clear. No
pneumothorax is seen. Heart size and mediastinal contours are
stable.
IMPRESSION: 1. Support apparatus appears stable in position.
2. Hazy opacities of bilateral lung bases, likely mild atelectasis
and/or small pleural effusions.

## 2022-07-14 ENCOUNTER — Telehealth: Payer: Self-pay | Admitting: Cardiovascular Disease

## 2022-07-14 NOTE — Telephone Encounter (Signed)
Pt called and would like to do a Provider Switch  From : Gwenlyn Found  To: Dr Marisue Ivan

## 2022-07-15 ENCOUNTER — Encounter: Payer: Self-pay | Admitting: Gastroenterology

## 2022-07-16 IMAGING — MR MR HEAD W/O CM
10 of 11 series · 43 of 48 positions shown · non-contrast
Comparison: Head CT 2 days ago.

CLINICAL DATA: Previous cardiac arrest. Question anoxic brain
injury.

EXAM:
MRI HEAD WITHOUT CONTRAST
TECHNIQUE: Multiplanar, multiecho pulse sequences of the brain and surrounding
structures were obtained without intravenous contrast.

[Series 5: DWI · axial · 3.0mm · 0.88mm/px · z∈[-80,+63]mm · 10 of 102 slices shown (1 of 4)]
[im 1/102]
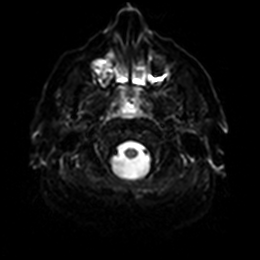
[im 12/102]
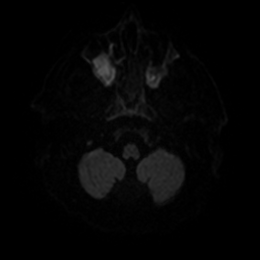
[im 23/102]
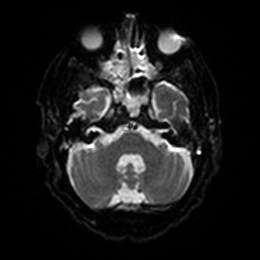
[im 34/102]
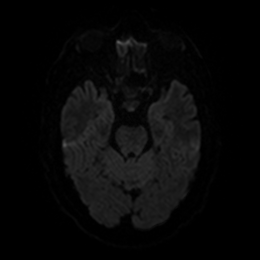
[im 45/102]
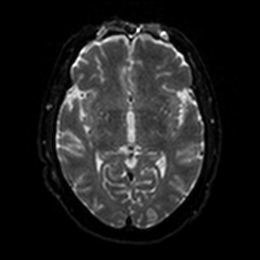
[im 57/102]
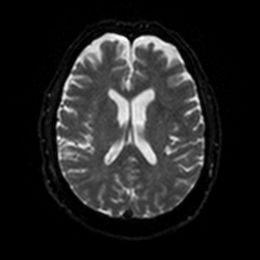
[im 68/102]
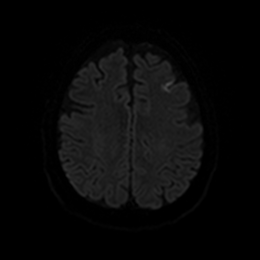
[im 79/102]
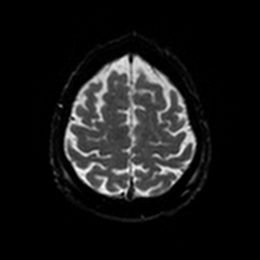
[im 90/102]
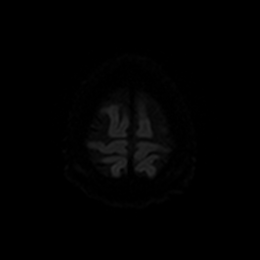
[im 102/102]
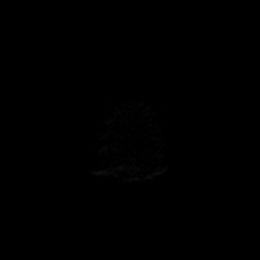

[Series 6: DWI · axial · 3.0mm · 0.88mm/px · z∈[-80,+63]mm · 5 of 51 slices shown (2 of 4)]
[im 1/51]
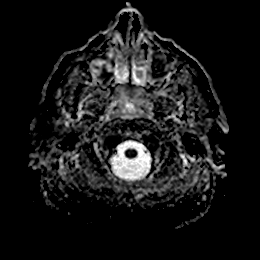
[im 13/51]
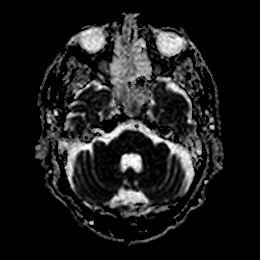
[im 26/51]
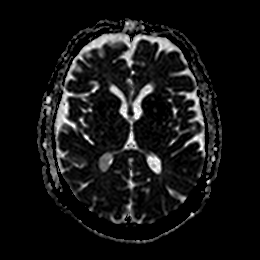
[im 38/51]
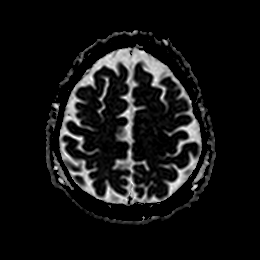
[im 51/51]
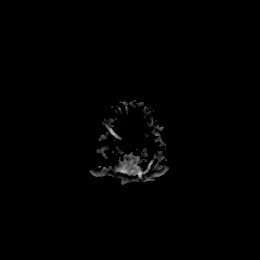

[Series 7: DWI · coronal · 4.0mm · 0.88mm/px · 6 of 64 slices shown (3 of 4)]
[im 1/64]
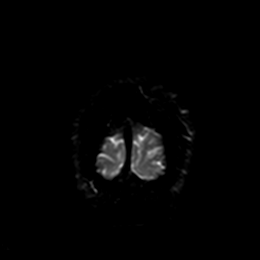
[im 13/64]
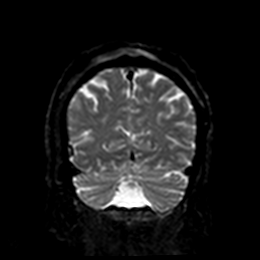
[im 26/64]
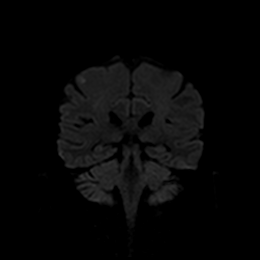
[im 38/64]
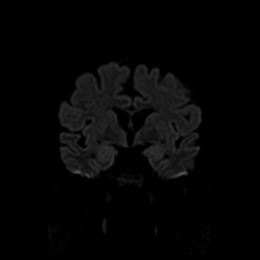
[im 51/64]
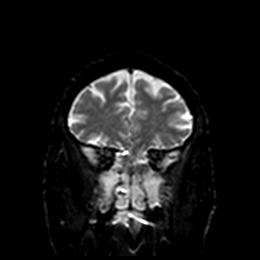
[im 64/64]
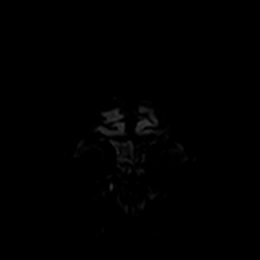

[Series 8: DWI · coronal · 4.0mm · 0.88mm/px · 3 of 32 slices shown (4 of 4)]
[im 1/32]
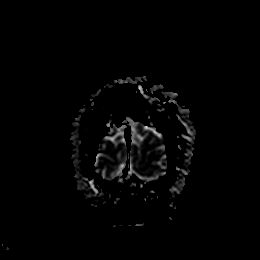
[im 16/32]
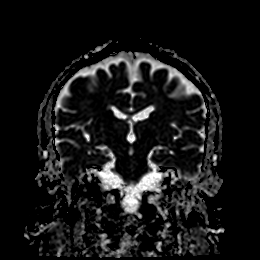
[im 32/32]
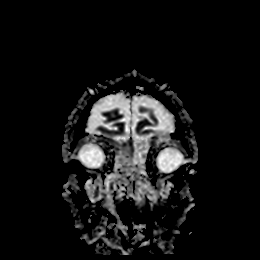

[Series 9: T1 · sagittal · 5.0mm · 0.75mm/px · 2 of 23 slices shown]
[im 1/23]
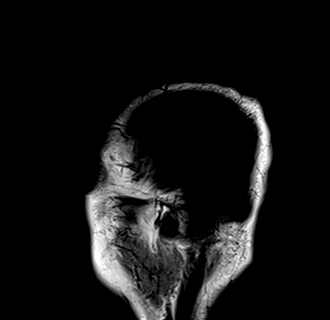
[im 23/23]
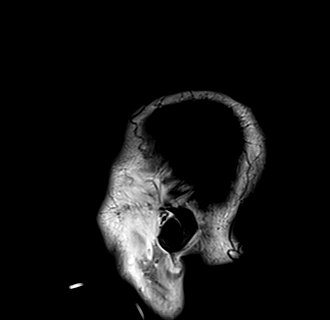

[Series 10: T2 · axial · 5.0mm · 0.72mm/px · z∈[-79,+70]mm · 2 of 27 slices shown (1 of 2)]
[im 1/27]
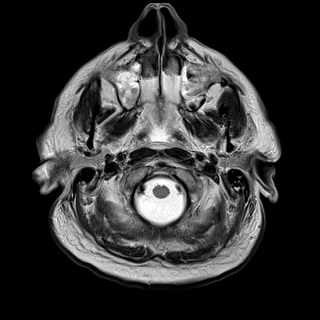
[im 27/27]
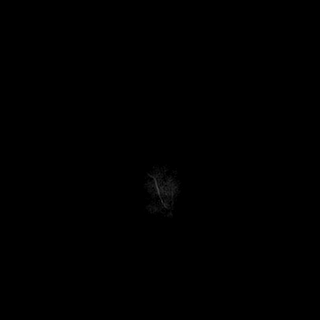

[Series 11: FLAIR · axial · 5.0mm · 0.45mm/px · z∈[-78,+72]mm · 2 of 27 slices shown]
[im 1/27]
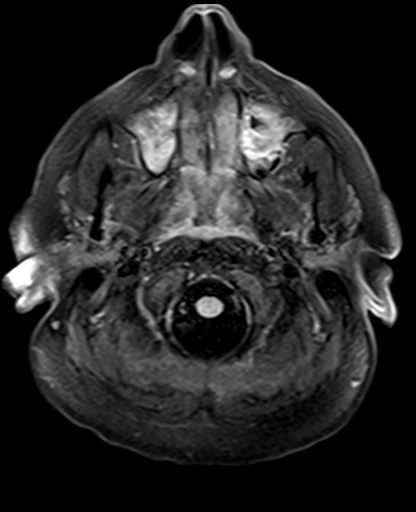
[im 27/27]
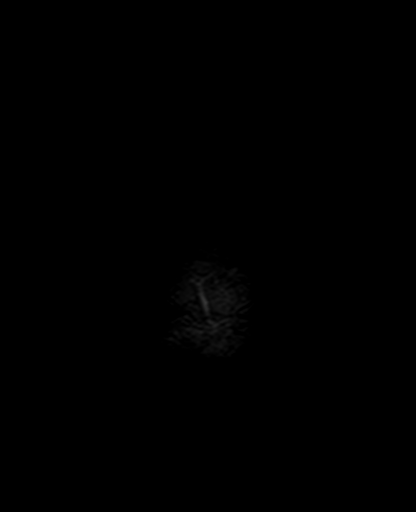

[Series 13: pha_images · axial · 3.0mm · 0.90mm/px · z∈[-92,+78]mm · 5 of 60 slices shown]
[im 1/60]
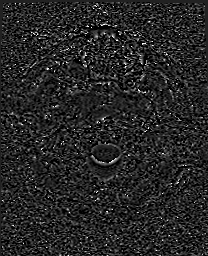
[im 15/60]
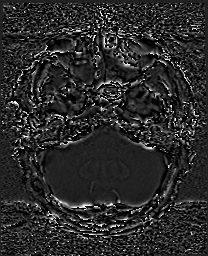
[im 30/60]
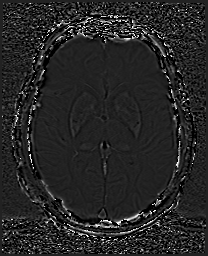
[im 45/60]
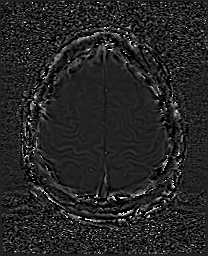
[im 60/60]
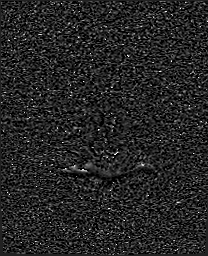

[Series 14: swi_images · axial · 3.0mm · 0.90mm/px · z∈[-92,+78]mm · 5 of 60 slices shown]
[im 1/60]
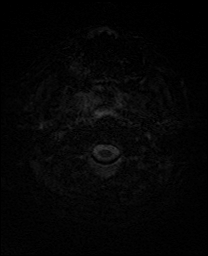
[im 15/60]
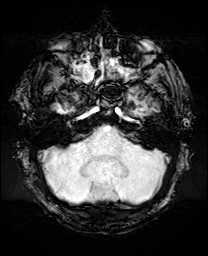
[im 30/60]
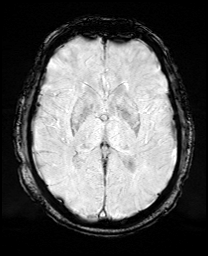
[im 45/60]
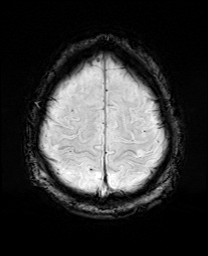
[im 60/60]
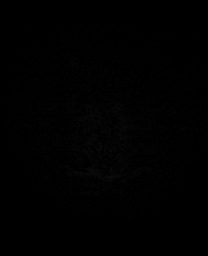

[Series 17: T2 · coronal · 5.0mm · 0.34mm/px · 3 of 29 slices shown (2 of 2)]
[im 1/29]
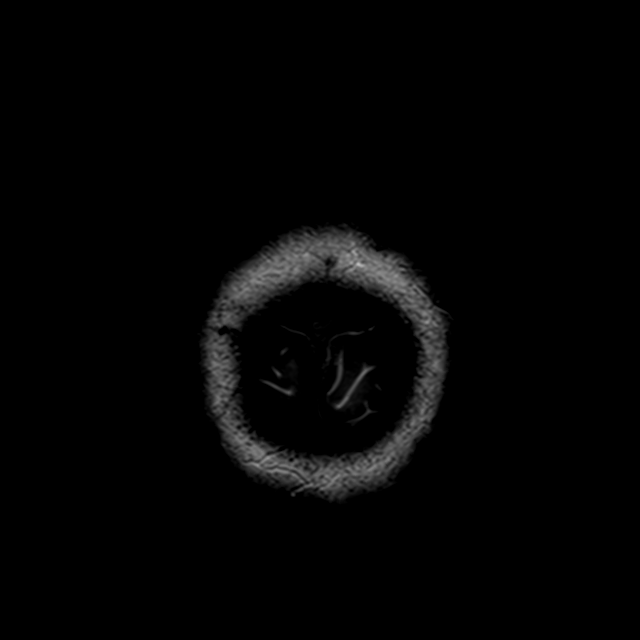
[im 15/29]
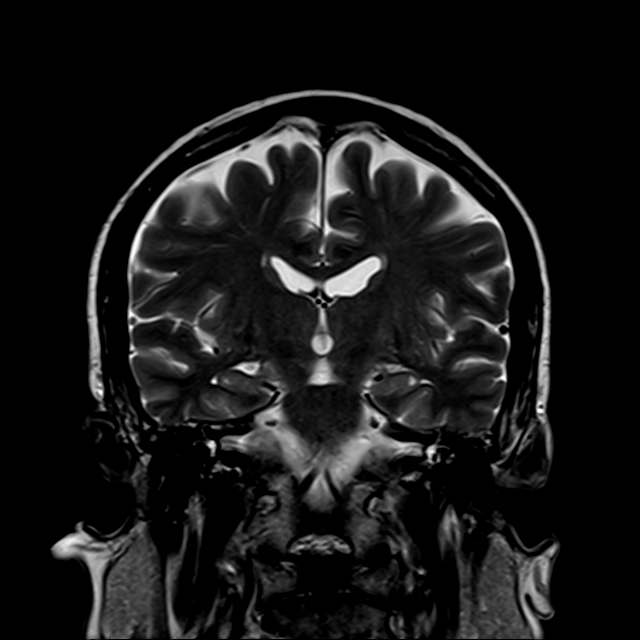
[im 29/29]
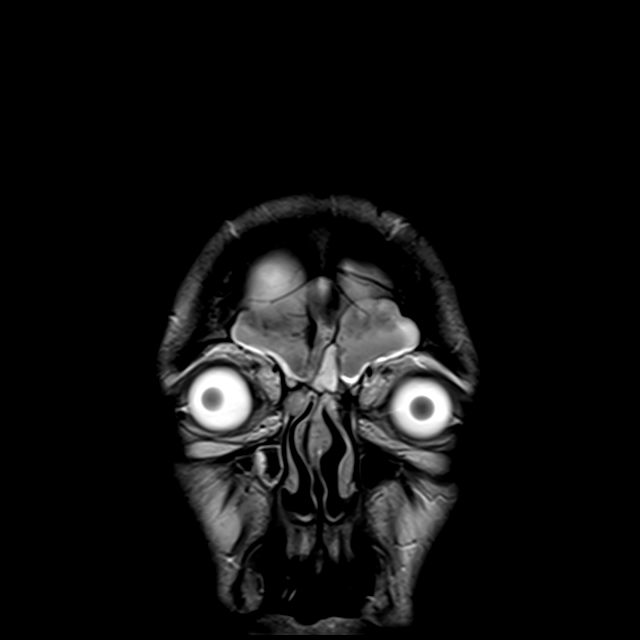

[43 of 48 positions shown; findings below may reference images not displayed]

FINDINGS: Brain: Diffusion imaging shows numerous scattered small foci
infarction affecting the cortical brain consistent with micro
embolic infarctions rather than hypoxic ischemic injury generally.
Single small focus present in the right cerebellum. Right cerebral
hemisphere shows for punctate foci scattered in the parietal region.
Left hemisphere shows a small cortical infarction left
parietooccipital junction region and 8-10 punctate foci scattered
within the frontal and parietal lobes. Small slightly larger
cortical infarctions present in the left frontal and parietal vertex
regions. No evidence of mass effect or hemorrhage. No evidence of
pre-existing brain abnormality. No hydrocephalus or extra-axial
collection.

Vascular: Major vessels at the base of the brain show flow.

Skull and upper cervical spine: Negative

Sinuses/Orbits: Widespread sinus opacification, often seen with
mechanical ventilation. Orbits negative.

Other: None
IMPRESSION: 1. Numerous scattered punctate to small foci of infarction
throughout the brain consistent with micro embolic infarctions
rather than hypoxic ischemic injury generally. No evidence of
hemorrhage or mass effect.
2. Widespread sinus opacification, often seen with mechanical
ventilation.

## 2022-07-22 ENCOUNTER — Encounter: Payer: Medicare PPO | Admitting: Gastroenterology

## 2022-07-22 ENCOUNTER — Ambulatory Visit (AMBULATORY_SURGERY_CENTER): Payer: Medicare PPO | Admitting: Gastroenterology

## 2022-07-22 ENCOUNTER — Encounter: Payer: Self-pay | Admitting: Gastroenterology

## 2022-07-22 VITALS — BP 120/68 | HR 54 | Temp 97.8°F | Resp 14 | Ht 72.0 in | Wt 183.0 lb

## 2022-07-22 DIAGNOSIS — D127 Benign neoplasm of rectosigmoid junction: Secondary | ICD-10-CM

## 2022-07-22 DIAGNOSIS — Z09 Encounter for follow-up examination after completed treatment for conditions other than malignant neoplasm: Secondary | ICD-10-CM

## 2022-07-22 DIAGNOSIS — Z86010 Personal history of colonic polyps: Secondary | ICD-10-CM

## 2022-07-22 DIAGNOSIS — D123 Benign neoplasm of transverse colon: Secondary | ICD-10-CM | POA: Diagnosis not present

## 2022-07-22 DIAGNOSIS — K635 Polyp of colon: Secondary | ICD-10-CM | POA: Diagnosis not present

## 2022-07-22 MED ORDER — SODIUM CHLORIDE 0.9 % IV SOLN: 500.0000 mL | Freq: Once | INTRAVENOUS | Status: DC

## 2022-07-22 NOTE — Progress Notes (Signed)
[facility] History and Physical   Primary Care Physician:  [name], MD   Reason for Procedure:   History of colon polyps  Plan:    colonoscopy     HPI: [name] is a 71 y.o. male  here for colonoscopy surveillance - history of sessile serrated polyp removed 06/2017.   Patient denies any bowel symptoms at this time. No family history of colon cancer known. Otherwise feels well without any cardiopulmonary symptoms.   I have discussed risks / benefits of anesthesia and endoscopic procedure with [name] and they wish to proceed with the exams as outlined today.    Past Medical History:  Diagnosis Date   Anoxic brain injury ([place])    Anxiety    Arthritis    Cancer ([place]) 2010   basal cell ca, head   Cataract    bilateral   Depression    ED (erectile dysfunction)    GERD (gastroesophageal reflux disease)    Heart failure ([place])    Hyperlipidemia    Hypertension    Kidney stone 07/29/2011   passed    Myocardial infarction [facility])    Neuromuscular disorder ([place])    neuropathy legs   Psoriasis    Vitreous floater    left eye, sees Dr. Dawna Part at [facility]     Past Surgical History:  Procedure Laterality Date   BASAL CELL CARCINOMA EXCISION     removed from scalp   COLONOSCOPY  07/03/2017   per Dr. Havery Moros, sessile serrated polyp, repeat in 5 yrs    [place] CATH AND CORONARY ANGIOGRAPHY N/A 01/03/2021   Procedure: LEFT HEART CATH AND CORONARY ANGIOGRAPHY;  Surgeon: Troy Sine, MD;  Location: [place] [facility] [place] [facility];  Service: Cardiovascular;  Laterality: N/A;   RIGHT HEART CATH N/A 01/03/2021   Procedure: RIGHT HEART CATH;  Surgeon: Jolaine Artist, MD;  Location: [facility];  Service: Cardiovascular;  Laterality: N/A;    Prior to Admission medications   Medication Sig Start Date End Date Taking? Authorizing Provider  aspirin 81 MG chewable tablet Chew 1 tablet (81 mg total) by mouth daily.  03/21/21  Yes [name], MD  carvedilol (COREG) 3.125 MG tablet TAKE ONE TABLET BY MOUTH EVERY MORNING and TAKE ONE TABLET BY MOUTH EVERY EVENING 03/07/22  Yes [name], MD  Cholecalciferol (VITAMIN D) 50 MCG (2000 UT) CAPS Take 1 capsule by mouth daily.   Yes [provider]  diclofenac Sodium (VOLTAREN) 1 % GEL Apply 2 g topically 2 (two) times daily as needed (pain).   Yes [provider]  DULoxetine (CYMBALTA) 20 MG capsule Take 1 capsule (20 mg total) by mouth daily. 12/20/21  Yes [name], MD  ENTRESTO 24-26 MG TAKE ONE TABLET BY MOUTH twice daily 04/24/22  Yes Bensimhon, Shaune Pascal, MD  FARXIGA 10 MG TABS tablet TAKE ONE TABLET BY MOUTH BEFORE BREAKFAST 02/06/22  Yes [place], Maricela Bo, FNP  finasteride ([name]) 5 MG tablet Take 1 tablet (5 mg total) by mouth daily. 06/11/22  Yes [name], MD  ketoconazole (NIZORAL) 2 % cream Apply 1 application topically daily as needed for irritation.   Yes [provider]  LORazepam (ATIVAN) 2 MG tablet Take 1 tablet (2 mg total) by mouth every 6 (six) hours as needed for anxiety. 02/21/22  Yes [name], MD  melatonin 3 MG TABS tablet Take 1 tablet by mouth daily.  Yes [provider]  omeprazole (PRILOSEC) 20 MG capsule TAKE ONE CAPSULE BY MOUTH daily as needed 06/05/22  Yes [name], MD  rosuvastatin (CRESTOR) 10 MG tablet TAKE ONE TABLET BY MOUTH ONCE DAILY 06/05/22  Yes [name], MD  triamcinolone cream (KENALOG) 0.1 % Apply 1 application topically 2 (two) times daily as needed (psoriasis).   Yes [provider]  nitroGLYCERIN (NITROSTAT) 0.4 MG SL tablet Place 1 tablet (0.4 mg total) under the tongue every 5 (five) minutes as needed for chest pain. 02/14/21   [name], MD  sildenafil (REVATIO) 20 MG tablet Take 20 mg by mouth as needed.    [provider]  tadalafil (CIALIS) 20 MG tablet Take 20 mg by mouth daily as needed.    12/17/11  [provider]     Current Outpatient Medications  Medication Sig Dispense Refill   aspirin 81 MG chewable tablet Chew 1 tablet (81 mg total) by mouth daily. 90 tablet 0   carvedilol (COREG) 3.125 MG tablet TAKE ONE TABLET BY MOUTH EVERY MORNING and TAKE ONE TABLET BY MOUTH EVERY EVENING 180 tablet 1   Cholecalciferol (VITAMIN D) 50 MCG (2000 UT) CAPS Take 1 capsule by mouth daily.     diclofenac Sodium (VOLTAREN) 1 % GEL Apply 2 g topically 2 (two) times daily as needed (pain).     DULoxetine (CYMBALTA) 20 MG capsule Take 1 capsule (20 mg total) by mouth daily. 90 capsule 3   ENTRESTO 24-26 MG TAKE ONE TABLET BY MOUTH twice daily 60 tablet 11   FARXIGA 10 MG TABS tablet TAKE ONE TABLET BY MOUTH BEFORE BREAKFAST 90 tablet 3   finasteride ([name]) 5 MG tablet Take 1 tablet (5 mg total) by mouth daily. 90 tablet 3   ketoconazole (NIZORAL) 2 % cream Apply 1 application topically daily as needed for irritation.     LORazepam (ATIVAN) 2 MG tablet Take 1 tablet (2 mg total) by mouth every 6 (six) hours as needed for anxiety. 120 tablet 5   melatonin 3 MG TABS tablet Take 1 tablet by mouth daily.     omeprazole (PRILOSEC) 20 MG capsule TAKE ONE CAPSULE BY MOUTH daily as needed 30 capsule 11   rosuvastatin (CRESTOR) 10 MG tablet TAKE ONE TABLET BY MOUTH ONCE DAILY 90 tablet 11   triamcinolone cream (KENALOG) 0.1 % Apply 1 application topically 2 (two) times daily as needed (psoriasis).     nitroGLYCERIN (NITROSTAT) 0.4 MG SL tablet Place 1 tablet (0.4 mg total) under the tongue every 5 (five) minutes as needed for chest pain. 50 tablet 3   sildenafil (REVATIO) 20 MG tablet Take 20 mg by mouth as needed.     Current Facility-Administered Medications  Medication Dose Route Frequency Provider Last Rate Last Admin   0.9 %  sodium chloride infusion  500 mL Intravenous Once Abdulah Iqbal, Carlota Raspberry, MD        Allergies as of 07/22/2022 - Review Complete 07/22/2022  Allergen Reaction Noted   Codeine Other (See Comments)  12/23/2010   Lisinopril Cough 08/15/2019   Nsaids  07/22/2022    Family History  Problem Relation Age of Onset   Hypertension Mother    Hyperlipidemia Mother    Depression Other    Diabetes Other    Hyperlipidemia Other    Hypertension Other    Alzheimer's disease Other    Colon cancer Neg Hx    Stomach cancer Neg Hx    Rectal cancer Neg  Hx    Esophageal cancer Neg Hx     Social History   Socioeconomic History   Marital status: Single    Spouse name: Not on file   Number of children: Not on file   Years of education: Not on file   Highest education level: Not on file  Occupational History   Occupation: retired    Comment: Pharmacist, hospital  Tobacco Use   Smoking status: Never   Smokeless tobacco: Never  Vaping Use   Vaping Use: Never used  Substance and Sexual Activity   Alcohol use: No    Alcohol/week: 0.0 standard drinks of alcohol   Drug use: No   Sexual activity: Not on file  Other Topics Concern   Not on file  Social History Narrative   Not on file   Social Determinants of Health   Financial Resource Strain: Low Risk  (06/04/2022)   Overall Financial Resource Strain (CARDIA)    Difficulty of Paying Living Expenses: Not hard at all  Food Insecurity: No Food Insecurity (12/09/2021)   Hunger Vital Sign    Worried About Running Out of Food in the Last Year: Never true    [place] in the Last Year: Never true  Transportation Needs: No Transportation Needs (12/09/2021)   PRAPARE - Hydrologist (Medical): No    Lack of Transportation (Non-Medical): No  Physical Activity: Sufficiently Active (12/09/2021)   Exercise Vital Sign    Days of Exercise per Week: 7 days    Minutes of Exercise per Session: 90 min  Stress: Stress Concern Present (12/09/2021)   [place]    Feeling of Stress : To some extent  Social Connections: Socially Isolated (12/28/2020)   Social Connection  and Isolation Panel [NHANES]    Frequency of Communication with Friends and Family: More than three times a week    Frequency of Social Gatherings with Friends and Family: More than three times a week    Attends Religious Services: Never    Marine scientist or Organizations: No    Attends Archivist Meetings: Never    Marital Status: Never married  Intimate Partner Violence: Not At Risk (12/28/2020)   Humiliation, Afraid, Rape, and Kick questionnaire    Fear of Current or Ex-Partner: No    Emotionally Abused: No    Physically Abused: No    Sexually Abused: No    Review of Systems: All other review of systems negative except as mentioned in the HPI.  Physical Exam: Vital signs BP (!) 165/77   Pulse 66   Temp 97.8 F (36.6 C)   Ht 6' (1.829 m)   Wt 183 lb (83 kg)   SpO2 98%   BMI 24.82 kg/m   General:   Alert,  Well-developed, pleasant and cooperative in NAD Lungs:  Clear throughout to auscultation.   Heart:  Regular rate and rhythm Abdomen:  Soft, nontender and nondistended.   Neuro/Psych:  Alert and cooperative. Normal mood and affect. A and O x 3  Jolly Mango, MD [facility]

## 2022-07-22 NOTE — Patient Instructions (Signed)
Information on polyps and diverticulosis given to you today.  Await pathology results.  Resume previous diet and medications.   YOU HAD AN ENDOSCOPIC PROCEDURE TODAY AT THE [facility]:   Refer to the procedure report that was given to you for any specific questions about what was found during the examination.  If the procedure report does not answer your questions, please call your gastroenterologist to clarify.  If you requested that your care partner not be given the details of your procedure findings, then the procedure report has been included in a sealed envelope for you to review at your convenience later.  YOU SHOULD EXPECT: Some feelings of bloating in the abdomen. Passage of more gas than usual.  Walking can help get rid of the air that was put into your GI tract during the procedure and reduce the bloating. If you had a lower endoscopy (such as a colonoscopy or flexible sigmoidoscopy) you may notice spotting of blood in your stool or on the toilet paper. If you underwent a bowel prep for your procedure, you may not have a normal bowel movement for a few days.  Please Note:  You might notice some irritation and congestion in your nose or some drainage.  This is from the oxygen used during your procedure.  There is no need for concern and it should clear up in a day or so.  SYMPTOMS TO REPORT IMMEDIATELY:  Following lower endoscopy (colonoscopy or flexible sigmoidoscopy):  Excessive amounts of blood in the stool  Significant tenderness or worsening of abdominal pains  Swelling of the abdomen that is new, acute  Fever of 100F or higher  For urgent or emergent issues, a gastroenterologist can be reached at any hour by calling [phone]. Do not use MyChart messaging for urgent concerns.    DIET:  We do recommend a small meal at first, but then you may proceed to your regular diet.  Drink plenty of fluids but you should avoid alcoholic beverages for 24  hours.  ACTIVITY:  You should plan to take it easy for the rest of today and you should NOT DRIVE or use heavy machinery until tomorrow (because of the sedation medicines used during the test).    FOLLOW UP: Our staff will call the number listed on your records the next business day following your procedure.  We will call around 7:15- 8:00 am to check on you and address any questions or concerns that you may have regarding the information given to you following your procedure. If we do not reach you, we will leave a message.     If any biopsies were taken you will be contacted by phone or by letter within the next 1-3 weeks.  Please call us at [phone] if you have not heard about the biopsies in 3 weeks.    SIGNATURES/CONFIDENTIALITY: You and/or your care partner have signed paperwork which will be entered into your electronic medical record.  These signatures attest to the fact that that the information above on your After Visit Summary has been reviewed and is understood.  Full responsibility of the confidentiality of this discharge information lies with you and/or your care-partner. 

## 2022-07-22 NOTE — Progress Notes (Signed)
Called to room to assist during endoscopic procedure.  Patient ID and intended procedure confirmed with present staff. Received instructions for my participation in the procedure from the performing physician.  

## 2022-07-22 NOTE — Op Note (Signed)
[place] Patient Name: [name] Procedure Date: 07/22/2022 8:36 AM MRN: [id number] Endoscopist: Remo Lipps P. Havery Moros , MD, [id number] Age: 71 Referring MD:  Date of Birth: [birth year] Gender: Male Account #: [id number] Procedure:                Colonoscopy Indications:              High risk colon cancer surveillance: Personal                            history of colonic polyps - sessile serrated polyp                            removed 06/2017 Medicines:                Monitored Anesthesia Care Procedure:                Pre-Anesthesia Assessment:                           - Prior to the procedure, a History and Physical                            was performed, and patient medications and                            allergies were reviewed. The patient's tolerance of                            previous anesthesia was also reviewed. The risks                            and benefits of the procedure and the sedation                            options and risks were discussed with the patient.                            All questions were answered, and informed consent                            was obtained. Prior Anticoagulants: The patient has                            taken no anticoagulant or antiplatelet agents. ASA                            Grade Assessment: III - A patient with severe                            systemic disease. After reviewing the risks and                            benefits, the patient was deemed in satisfactory  condition to undergo the procedure.                           After obtaining informed consent, the colonoscope                            was passed under direct vision. Throughout the                            procedure, the patient's blood pressure, pulse, and                            oxygen saturations were monitored continuously. The                            CF [id number] #[id number] was introduced  through the anus                            and advanced to the the cecum, identified by                            appendiceal orifice and ileocecal valve. The                            colonoscopy was performed without difficulty. The                            patient tolerated the procedure well. The quality                            of the bowel preparation was adequate. The                            ileocecal valve, appendiceal orifice, and rectum                            were photographed. Scope In: 8:41:11 AM Scope Out: 8:59:59 AM Scope Withdrawal Time: 0 hours 14 minutes 19 seconds  Total Procedure Duration: 0 hours 18 minutes 48 seconds  Findings:                 The perianal and digital rectal examinations were                            normal.                           A 4 mm polyp was found in the splenic flexure. The                            polyp was sessile. The polyp was removed with a                            cold snare. Resection and retrieval were complete.  A 3 mm polyp was found in the recto-sigmoid colon.                            The polyp was sessile. The polyp was removed with a                            cold snare. Resection and retrieval were complete.                           A few small-mouthed diverticula were found in the                            sigmoid colon.                           Internal hemorrhoids were found during                            retroflexion. The hemorrhoids were small.                           The exam was otherwise without abnormality. Of                            note, prep was adequate but significant residual                            seeds / nuts in the colon which took time to lavage                            / clear. Complications:            No immediate complications. Estimated blood loss:                            Minimal. Estimated Blood Loss:     Estimated blood loss was  minimal. Impression:               - One 4 mm polyp at the splenic flexure, removed                            with a cold snare. Resected and retrieved.                           - One 3 mm polyp at the recto-sigmoid colon,                            removed with a cold snare. Resected and retrieved.                           - Diverticulosis in the sigmoid colon.                           - Internal hemorrhoids.                           -  The examination was otherwise normal. Recommendation:           - Patient has a contact number available for                            emergencies. The signs and symptoms of potential                            delayed complications were discussed with the                            patient. Return to normal activities tomorrow.                            Written discharge instructions were provided to the                            patient.                           - Resume previous diet.                           - Continue present medications.                           - Await pathology results. Remo Lipps P. Ayame Rena, MD 07/22/2022 9:05:56 AM This report has been signed electronically.

## 2022-07-22 NOTE — Progress Notes (Signed)
Sedate, gd SR, tolerated procedure well, VSS, report to RN 

## 2022-07-23 ENCOUNTER — Ambulatory Visit: Payer: Medicare PPO | Attending: Family Medicine | Admitting: Physical Therapy

## 2022-07-23 ENCOUNTER — Ambulatory Visit: Payer: Medicare PPO | Admitting: Physical Therapy

## 2022-07-23 ENCOUNTER — Encounter: Payer: Self-pay | Admitting: Physical Therapy

## 2022-07-23 ENCOUNTER — Telehealth: Payer: Self-pay

## 2022-07-23 DIAGNOSIS — G8929 Other chronic pain: Secondary | ICD-10-CM | POA: Diagnosis not present

## 2022-07-23 DIAGNOSIS — M5459 Other low back pain: Secondary | ICD-10-CM | POA: Diagnosis not present

## 2022-07-23 DIAGNOSIS — M6281 Muscle weakness (generalized): Secondary | ICD-10-CM | POA: Insufficient documentation

## 2022-07-23 DIAGNOSIS — M5442 Lumbago with sciatica, left side: Secondary | ICD-10-CM | POA: Diagnosis not present

## 2022-07-23 NOTE — Telephone Encounter (Signed)
Left message on answering machine. 

## 2022-07-23 NOTE — Therapy (Signed)
OUTPATIENT PHYSICAL THERAPY EVALUATION   Patient Name: [name] MRN: [id number] DOB:1951/03/05, 71 y.o., male Today's Date: 07/23/2022   PT End of Session - 07/23/22 2120     Visit Number 1    Number of Visits 24    Date for PT Re-Evaluation 10/15/22    Authorization Type [employer] MEDIC[employer] reporting period from 07/23/2022    Authorization Time Period auth required    Authorization - Visit Number 1    Authorization - Number of Visits 1    PT Start Time 1035    PT Stop Time 1115    PT Time Calculation (min) 40 min    Activity Tolerance Patient tolerated treatment well    Behavior During Therapy [facility] for tasks assessed/performed             Past Medical History:  Diagnosis Date   Anoxic brain injury (HCC)    Anxiety    Arthritis    Cancer (HCC) 2010   basal cell ca, head   Cataract    bilateral   Depression    ED (erectile dysfunction)    GERD (gastroesophageal reflux disease)    Heart failure (HCC)    Hyperlipidemia    Hypertension    Kidney stone 07/29/2011   passed    Myocardial infarction (HCC)    Neuromuscular disorder (HCC)    neuropathy legs   Psoriasis    Vitreous floater    left eye, sees Dr. Oren Bracket at [facility]    Past Surgical History:  Procedure Laterality Date   BASAL CELL CARCINOMA EXCISION     removed from scalp   COLONOSCOPY  07/03/2017   per Dr. Adela Lank, sessile serrated polyp, repeat in 5 yrs    HERNIA REPAIR     LEFT HEART CATH AND CORONARY ANGIOGRAPHY N/A 01/03/2021   Procedure: LEFT HEART CATH AND CORONARY ANGIOGRAPHY;  Surgeon: Lennette Bihari, MD;  Location: [facility] INVASIVE CV LAB;  Service: Cardiovascular;  Laterality: N/A;   RIGHT HEART CATH N/A 01/03/2021   Procedure: RIGHT HEART CATH;  Surgeon: Dolores Patty, MD;  Location: [facility] INVASIVE CV LAB;  Service: Cardiovascular;  Laterality: N/A;   Patient Active Problem List   Diagnosis Date Noted   CAD, multiple vessel 09/03/2021   ST elevation myocardial infarction  (STEMI) (HCC) 09/03/2021   Type 2 diabetes mellitus with diabetic chronic kidney disease (HCC) 05/30/2021   CKD (chronic kidney disease), stage III (HCC) 05/30/2021   Hemoptysis    Acute hypoxemic respiratory failure (HCC)    Acute renal failure (HCC)    Encephalopathy acute    Cardiogenic shock (HCC) 01/03/2021   Atypical chest pain 10/25/2019   Coronary artery calcification seen on CAT scan 06/15/2019   Nodule of lower lobe of left lung 06/15/2019   Tremor of right hand 04/24/2017   Numbness and tingling of both legs 04/24/2017   Bilateral leg edema 06/05/2015   Chronic neck pain 06/05/2015   Plantar fasciitis 02/01/2014   Visual floaters 04/04/2013   Subjective vision disturbance, left 04/04/2013   BPH with urinary obstruction 01/01/2010   MELANOMA, HX OF 06/01/2009   NECK PAIN 09/11/2008   CELLULITIS AND ABSCESS OF LEG EXCEPT FOOT 04/26/2008   ERECTILE DYSFUNCTION 10/12/2007   Hyperlipemia, mixed 04/12/2007   Anxiety state 04/12/2007   Essential hypertension 04/12/2007   GERD 04/12/2007    PCP: Nelwyn Salisbury, MD  REFERRING PROVIDER: Nelwyn Salisbury, MD  REFERRING DIAG: chronic left-sided low back pain with left-sided sciatica  Rationale for Evaluation and Treatment: Rehabilitation  THERAPY DIAG:  Other low back pain  Muscle weakness (generalized)  ONSET DATE: first episode in early 2000s, most recent episode June 29/2023  SUBJECTIVE:                                                                                                                                                                                           SUBJECTIVE STATEMENT: Patient states on March 20, 2022 he was picking bugs off of flowers and it suddenly felt like someone was stabbing him in the left lower back. He thought he was going to fall, but he did not. He was able to go on with his day but it gradually started aching, he started sweating. He called a friend who took him to urgent care where  they gave him hydrocodone which helped and he was feeling better in a few days. He states he also had pain in the left groin. That seems to be better now. When he went to see his PCP he brought it up and he ordered an xray. He states he tries not to let the back trouble stop him. It is now intermittent and not bothering him this week but was bothering him last week. He had his first episode of low back pain in the early 2000s and was given Lodine that resolved it. He took it for 3-5 years and it resolved itself.There was no apparent injury when he first had back pain.  He now cannot take anything like that. He can take tylenol but no NSAIDS. He has a few methocarbamol left and he took one last week to see if that helps. His pain strikes and then eases off. He is also using heat and trying to follow information he was given on how to move, bend, and lift that the doctor gave him. When he went to urgent care with this episode the pain went down the lateral left thigh about half way down. He feels like the pain is getting better overall, but he has had a couple of flair ups over the last couple of months. It is hard for him to straighten up when he first gets up and he feels like he is a hunchback. It is also hard to stand in the same position for vary long. [facility] [place] favorite thing is the treadmill. He also does the stairs sometimes or the bicycle, but that is a little uncomfortable for his back. He has a history of two cardiac resuscitations that has affected his balance and cognition. He is careful and methodical to  prevent falls. He does a lot of walking with his dog. When his pian is flared up it slows his movements down. He says he has neuropathy from his feet to just proximal to knees.   He had an MI in April 2023 per pt (chart shows 2022). He has a stent placed after 2x resuscitation. He was in the hospital on May 19th. He underwent dialysis for a while due to kidney injury from  lack of blood flow and has an anoxic brain injury due to the cardiac arrests. He graduated form the heart failure clinic. He will have a regular cardiologist.   PERTINENT HISTORY:  Patient is a 71 y.o. male who presents to outpatient physical therapy with a referral for medical diagnosis chronic left-sided low back pain with left-sided sciatica. This patient's chief complaints consist of intermittent low back pain with occasional pain/paresthesia to left groin and glute leading to the following functional deficits: difficulty with usual activities such as going sit to stand after sitting, prolonged standing, prolonged sitting, difficulty with ADLs and IADLs when it is flared up, getting in and out of truick, cleaning, vacuuming, laying flat. Relevant past medical history and comorbidities include anoxic brain injury, heart failure and acute kidney injury (associated with two cardiac resuscitations after hospitalization for MI in April 2022), anxiety, arthritis, basal cell cancer (removed), cataracts, depression, erectile dysfunction, GERD, hyperlipidemia, hypertension, Kidney stone, neuropathy of legs, psoriasis, vitreous floater, right  and left heart catheterization, (12/2020), nodule of left lung (being monitored), type II DM, plantar fasciitis, CKD, BPH with urinary obstruction, neck pain, history of melanoma, tremor of right hand, bilateral leg edema, HTN.  Patient denies hx of stroke, seizures, unexplained weight loss, unexplained changes in bowel or bladder problems, unexplained stumbling or dropping things, osteoporosis, and spinal surgery  PAIN:  Are you having pain? Yes: NPRS scale: Current: 0-1/10,  Best: 0/10, Worst: 10/10. Pain location: left and right low back, left groin paresthesia to glute. Pain description: felt like being stabbed with a knife Aggravating factors: going from sit to stand after prolonged sitting, prolonged standing, prolonged sitting[facility].  Relieving factors:  walking, moving more regularly  FUNCTIONAL LIMITATIONS: difficulty with usual activities such as going sit to stand after sitting, prolonged standing, prolonged sitting, difficulty with ADLs and IADLs when it is flared up, getting in and out of truck, cleaning, vacuuming, laying flat.    PRECAUTIONS: watch what he does and know the signs he needs to slow down or rest, carry nitroglycerine, jogging.   WEIGHT BEARING RESTRICTIONS: No  FALLS:  Has patient fallen in last 6 months? No  LIVING ENVIRONMENT: Lives with: lives alone Lives in: a two story town-home but everything he needs is downstairs. He does go up stairs. Stairs: 14 inside home with right sided hand rails. 1 step to get in home with no handrails.  Has following equipment at home: Dan Humphreys - 2 wheeled but does not need to use. Has a shower chair and a grab bar in the tub.   OCCUPATION: retired Runner, broadcasting/film/video  PLOF: Independent  LEISURE: play cards (belongs to card group), going out to dinner with friends, spending time with friends, has a dog, watching movies at home.   PATIENT GOALS: "I don't want to fall as a result of my back and I don't want to be walking one day and then all of a sudden not be able to walk the next day" which happens.    OBJECTIVE  DIAGNOSTIC FINDINGS:  Lumbar  xray report from 05/15/2022:  CLINICAL DATA:  Left-sided low back pain   EXAM: LUMBAR SPINE - COMPLETE 4+ VIEW   COMPARISON:  None Available.   FINDINGS: There is levoscoliosis in the lumbar spine. No recent fracture is seen. Alignment of posterior margins of vertebral bodies is unremarkable. Degenerative changes are noted with disc space narrowing, bony spurs and facet hypertrophy throughout the lumbar spine. There are scattered arterial calcifications. There is evidence of previous hernia repair in the inguinal regions.   IMPRESSION: No recent fracture is seen in lumbar spine. Levoscoliosis. Moderate to severe degenerative changes are noted  throughout lumbar spine with disc space narrowing, bony spurs and facet hypertrophy.     Electronically Signed   By: Ernie Avena M.D.   On: 05/16/2022 20:19    SELF- REPORTED FUNCTION FOTO score: 72/100 (lumbar spine questionnaire)  OBSERVATION/INSPECTION Posture Posture (standing): decreased lumbar lordosis, increased thoracic kyphosis.  Anthropometrics Tremor: mild in head Body composition: BMI 24.0 Muscle bulk: WNL Skin: appears WNL where visualized Edema: none Functional Mobility Bed mobility: supine <> sit and rolling I Transfers: sit <> stand I Gait: grossly WFL for household and short community ambulation. More detailed gait analysis deferred to later date as needed.    SPINE MOTION  LUMBAR SPINE AROM *Indicates pain Flexion: fingers to proximal third of tibias, pain in bilateral posterior thighs Extension: 25% pressure in lower back Side Flexion:   R 25% contralateral stretch  L 25% contralateral pain Rotation:  R 25% L 25%  NEUROLOGICAL Dermatomes L2-S2 appears equal and intact to light touch.  PERIPHERAL JOINT MOTION (in degrees) PASSIVE RANGE OF MOTION (PROM) 07/24/2022: BLE grossly WNL for basic mobility except lacks bilateral hip extension past neutral.   MUSCLE PERFORMANCE (MMT):  *Indicates pain 07/23/22 Date Date  Joint/Motion R/L R/L R/L  Hip     Flexion (L1, L2) 4+/5 / /  Extension (knee ext) 4+/4+ / /  Abduction 4+/4+ / /  Knee     Extension (L3) 5/5 / /  Flexion (S2) 5/5 / /  Ankle/Foot     Dorsiflexion (L4) 4+/4 / /  Great toe extension (L5) 5/5 / /  Eversion (S1) 5/5 / /  Plantarflexion (S1) 4+/4+ / /  Comments:  07/23/2022: able to toe walk and heel walk bilaterally but less able to lift left toes compared to right.   SPECIAL TESTS:  LOWER LIMB NEURODYNAMIC TESTS Straight Leg Raise (Sciatic nerve) R  = positive for non concordant burning at posterior thigh L  = positive for non concordant burning at posterior  thigh  ACCESSORY MOTION: Very tender to CPA from lower thoracic spine to sacrum, worst at lowest lumbar segments.   PALPATION: TTP in left QL, mildly in right posterior hip/glute.   REPEATED MOTIONS TESTING: Prone press up 1x5, no change in leg pain. Very stiff and difficult. Pain/stiffness across low back, no worse.     TODAY'S TREATMENT:  education    PATIENT EDUCATION:  Education details: Education on diagnosis, prognosis, POC, anatomy and physiology of current condition Person educated: Patient Education method: Programmer, multimedia, Demonstration, Tactile cues, and Verbal cues Education comprehension: verbalized understanding, returned demonstration, and needs further education  HOME EXERCISE PROGRAM: TBD  ASSESSMENT:  CLINICAL IMPRESSION: Patient is a 71 y.o. male referred to outpatient physical therapy with a medical diagnosis of chronic left-sided low back pain with left-sided sciatica who presents with signs and symptoms consistent with chronic low back pain with intermittent referral to left groin and thigh above knee. Patient is very stiff in all lumbar movements and hip extension which likely contribute to his pain.  Patient presents with significant pain, joint stiffness, ROM, posture, balance, muscle performance (strength/power/endurance), and activity tolerance impairments that are limiting ability to complete usual activities such as going sit to stand after sitting, prolonged standing, prolonged sitting, difficulty with ADLs and IADLs when it is flared up, getting in and out of truck, cleaning, vacuuming, laying flat without difficulty. Patient will benefit from skilled physical therapy intervention to address current body structure impairments and activity limitations to improve function and work towards goals set in current POC in order to return to prior  level of function or maximal functional improvement.    OBJECTIVE IMPAIRMENTS: decreased activity tolerance, decreased balance, decreased endurance, decreased knowledge of condition, decreased mobility, difficulty walking, decreased ROM, decreased strength, hypomobility, impaired perceived functional ability, increased muscle spasms, impaired flexibility, postural dysfunction, and pain.   ACTIVITY LIMITATIONS: carrying, lifting, bending, sitting, standing, squatting, transfers, bed mobility, and locomotion level  PARTICIPATION LIMITATIONS: cleaning, laundry, community activity, yard work, and   usual activities such as going sit to stand after sitting, prolonged standing, prolonged sitting, difficulty with ADLs and IADLs when it is flared up, getting in and out of truick, cleaning, vacuuming, laying flat  PERSONAL FACTORS: Age, Past/current experiences, Time since onset of injury/illness/exacerbation, and 3+ comorbidities:   anoxic brain injury, heart failure and acute kidney injury (associated with two cardiac resuscitations after hospitalization for MI in April 2022), anxiety, arthritis, basal cell cancer (removed), cataracts, depression, erectile dysfunction, GERD, hyperlipidemia, hypertension, Kidney stone, neuropathy of legs, psoriasis, vitreous floater, right  and left heart catheterization, (12/2020), nodule of left lung (being monitored), type II DM, plantar fasciitis, CKD, BPH with urinary obstruction, neck pain, history of melanoma, tremor of right hand, bilateral leg edema, HTN are also affecting patient's functional outcome.   REHAB POTENTIAL: Good  CLINICAL DECISION MAKING: Stable/uncomplicated  EVALUATION COMPLEXITY: Low   GOALS: Goals reviewed with patient? No  SHORT TERM GOALS: Target date: 08/06/2022  Patient will be independent with initial home exercise program for self-management of symptoms. Baseline: Initial HEP to be provided at visit 2 as appropriate (07/23/22); Goal  status: INITIAL   LONG TERM GOALS: Target date: 10/15/2022  Patient will be independent with a long-term home exercise program for self-management of symptoms.  Baseline: Initial HEP to be provided at visit 2 as appropriate (07/23/22); Goal status: INITIAL  2.  Patient will demonstrate improved FOTO to equal or greater than 82 by visit #8 to demonstrate improvement in overall condition and self-reported functional ability.  Baseline: 71 (07/23/22); Goal status: INITIAL  3.  Patient will ambulate equal or greater than 1729 feet with LRAD to reach aged matched norm to improve community mobility.  Baseline: to be tested visit 2 as appropriate (07/23/22); Goal status: INITIAL  4.  Patient will report pain equal or less than 2/10 during functional activities  to improve his ability to complete activities such as vacuuming, shopping, and getting in and out of the truck.  Baseline: up to 10/10 (07/23/22); Goal status: INITIAL  5.  Patient will complete community, work and/or recreational activities without limitation due to current condition.  Baseline: difficulty with usual activities such as going sit to stand after sitting, prolonged standing, prolonged sitting, difficulty with ADLs and IADLs when it is flared up, getting in and out of truick, cleaning, vacuuming, laying flat (07/23/22); Goal status: INITIAL  PLAN:  PT FREQUENCY: 1-2x/week  PT DURATION: 12 weeks  PLANNED INTERVENTIONS: Therapeutic exercises, Therapeutic activity, Neuromuscular re-education, Balance training, Gait training, Patient/Family education, Self Care, Joint mobilization, DME instructions, Dry Needling, Electrical stimulation, Spinal mobilization, Cryotherapy, Moist heat, Manual therapy, and Re-evaluation.  PLAN FOR NEXT SESSION: update HEP as appropriate, manual therapy as needed, stretching, LE, core, and functional strengthening and balance as appropriate.    Luretha Murphy. Ilsa Iha, PT, DPT 07/24/22, 7:22 PM  [facility]  [facility] [address] [place], [place] [address] P: [phone] I F: [phone]

## 2022-07-28 ENCOUNTER — Ambulatory Visit: Payer: Medicare PPO | Admitting: Physical Therapy

## 2022-07-28 ENCOUNTER — Encounter: Payer: Self-pay | Admitting: Physical Therapy

## 2022-07-28 VITALS — BP 119/66 | HR 65

## 2022-07-28 DIAGNOSIS — M5459 Other low back pain: Secondary | ICD-10-CM

## 2022-07-28 DIAGNOSIS — M5442 Lumbago with sciatica, left side: Secondary | ICD-10-CM | POA: Diagnosis not present

## 2022-07-28 DIAGNOSIS — M6281 Muscle weakness (generalized): Secondary | ICD-10-CM | POA: Diagnosis not present

## 2022-07-28 DIAGNOSIS — G8929 Other chronic pain: Secondary | ICD-10-CM | POA: Diagnosis not present

## 2022-07-28 NOTE — Therapy (Signed)
OUTPATIENT PHYSICAL THERAPY TREATMENT NOTE   Patient Name: [name] MRN: [id number] DOB:[birth year], 71 y.o., male Today's Date: 07/28/2022  PCP: [name], MD REFERRING PROVIDER: [name], MD   END OF SESSION:   PT End of Session - 07/28/22 2004     Visit Number 2    Number of Visits 24    Date for PT Re-Evaluation 10/15/22    Authorization Type [employer]IC[employer] reporting period from 07/23/2022    Authorization Time Period auth 24 visits  11/6 - 1/26  Authorization #[id number]    Authorization - Visit Number 1    Authorization - Number of Visits 24    Progress Note Due on Visit 10    PT Start Time 0950    PT Stop Time 1030    PT Time Calculation (min) 40 min    Activity Tolerance Patient tolerated treatment well    Behavior During Therapy [facility] for tasks assessed/performed             Past Medical History:  Diagnosis Date   Anoxic brain injury ([place])    Anxiety    Arthritis    Cancer ([place]) 2010   basal cell ca, head   Cataract    bilateral   Depression    ED (erectile dysfunction)    GERD (gastroesophageal reflux disease)    Heart failure ([place])    Hyperlipidemia    Hypertension    Kidney stone 07/29/2011   passed    Myocardial infarction ([place])    Neuromuscular disorder ([place])    neuropathy legs   Psoriasis    Vitreous floater    left eye, sees Dr. Dawna Part at [facility]    Past Surgical History:  Procedure Laterality Date   BASAL CELL CARCINOMA EXCISION     removed from scalp   COLONOSCOPY  07/03/2017   per Dr. Havery Moros, sessile serrated polyp, repeat in 5 yrs    [place] CATH AND CORONARY ANGIOGRAPHY N/A 01/03/2021   Procedure: LEFT HEART CATH AND CORONARY ANGIOGRAPHY;  Surgeon: Troy Sine, MD;  Location: [facility] [facility];  Service: Cardiovascular;  Laterality: N/A;   RIGHT HEART CATH N/A 01/03/2021   Procedure: RIGHT HEART CATH;  Surgeon: Jolaine Artist, MD;  Location: [place] [facility];   Service: Cardiovascular;  Laterality: N/A;   Patient Active Problem List   Diagnosis Date Noted   CAD, multiple vessel 09/03/2021   ST elevation myocardial infarction (STEMI) ([place]) 09/03/2021   Type 2 diabetes mellitus with diabetic chronic kidney disease ([place]) 05/30/2021   CKD (chronic kidney disease), stage III ([place]) 05/30/2021   Hemoptysis    Acute hypoxemic respiratory failure ([facility])    Acute renal failure ([facility])    Encephalopathy acute    Cardiogenic shock ([place]) 01/03/2021   Atypical chest pain 10/25/2019   Coronary artery calcification seen on CAT scan 06/15/2019   Nodule of lower lobe of left lung 06/15/2019   Tremor of right hand 04/24/2017   Numbness and tingling of both legs 04/24/2017   Bilateral leg edema 06/05/2015   Chronic neck pain 06/05/2015   Plantar fasciitis 02/01/2014   Visual floaters 04/04/2013   Subjective vision disturbance, left 04/04/2013   BPH with urinary obstruction 01/01/2010   MELANOMA, HX OF 06/01/2009   NECK PAIN 09/11/2008   CELLULITIS AND ABSCESS OF LEG EXCEPT FOOT 04/26/2008   ERECTILE DYSFUNCTION 10/12/2007   Hyperlipemia, mixed 04/12/2007   Anxiety state 04/12/2007  Essential hypertension 04/12/2007   GERD 04/12/2007    REFERRING DIAG: chronic left-sided low back pain with left-sided sciatica   THERAPY DIAG:  Other low back pain  Muscle weakness (generalized)  Rationale for Evaluation and Treatment: Rehabilitation  PERTINENT HISTORY: Patient is a 71 y.o. male who presents to outpatient physical therapy with a referral for medical diagnosis chronic left-sided low back pain with left-sided sciatica. This patient's chief complaints consist of intermittent low back pain with occasional pain/paresthesia to left groin and glute leading to the following functional deficits: difficulty with usual activities such as going sit to stand after sitting, prolonged standing, prolonged sitting, difficulty with ADLs and IADLs when it is flared up,  getting in and out of truick, cleaning, vacuuming, laying flat. Relevant past medical history and comorbidities include anoxic brain injury, heart failure and acute kidney injury (associated with two cardiac resuscitations after hospitalization for MI in April 2022), anxiety, arthritis, basal cell cancer (removed), cataracts, depression, erectile dysfunction, GERD, hyperlipidemia, hypertension, Kidney stone, neuropathy of legs, psoriasis, vitreous floater, right  and left heart catheterization, (12/2020), nodule of left lung (being monitored), type II DM, plantar fasciitis, CKD, BPH with urinary obstruction, neck pain, history of melanoma, tremor of right hand, bilateral leg edema, HTN.  Patient denies hx of stroke, seizures, unexplained weight loss, unexplained changes in bowel or bladder problems, unexplained stumbling or dropping things, osteoporosis, and spinal surgery   PRECAUTIONS: watch what he does and know the signs he needs to slow down or rest, carry nitroglycerine, jogging.    SUBJECTIVE:                                                                                                                                                                                      SUBJECTIVE STATEMENT:  Patient arrives with print outs of exercises programs he has been provided from previous PT and cardiac rehab.    PAIN:  Are you having pain? NPRS 0/10   OBJECTIVE:  Vitals:   07/28/22 0955  BP: 119/66  Pulse: 65  SpO2: 99%   FUNCTIONAL/BALANCE TESTS 6 Minute Walk Test: 1610 feet with no AD, building "pressure"  in low back, relieved by sitting after.   TODAY'S TREATMENT:  Therapeutic exercise: to centralize symptoms and improve ROM, strength, muscular endurance, and activity tolerance required for successful completion of functional activities.  - vitals check for safety (see above) - ambulation around  clinic for distance in 6 minutes to assess baseline (see above).  - seated lumbar flexion roll out with theraball, 1x2 min forwards, 1x2 min alternating diagonals.  - hooklying low trunk rotation, 1x20 each side.  - quadruped cat-cow, 1x20 plus additional reps to learn exercise. Difficulty with motor control at first. Improved with practice.   Pt required multimodal cuing for proper technique and to facilitate improved neuromuscular control, strength, range of motion, and functional ability resulting in improved performance and form.   PATIENT EDUCATION:  Education details: Exercise purpose/form. Self management techniques. Education on diagnosis, prognosis, POC, anatomy and physiology of current condition Education on HEP including handout  Reviewed cancelation/no-show policy with patient and confirmed patient has correct phone number for clinic; patient verbalized understanding (07/28/22). Person educated: Patient Education method: Explanation, Demonstration, Tactile cues, and Verbal cues Education comprehension: verbalized understanding, returned demonstration, and needs further education   HOME EXERCISE PROGRAM: Access Code: [email or web address] URL: [email or web address] Date: 07/28/2022 Prepared by: Rosita Kea  Exercises - Supine Lower Trunk Rotation  - 2 x daily - 1 sets - 20 reps - Cat Cow  - 2 x daily - 1-2 sets - 20 reps - Child's Pose Stretch  - 2 x daily - 1 sets - 20 reps   ASSESSMENT:   CLINICAL IMPRESSION: Patient arrives with usual low back discomfort. He was able to meet his 6 Minute Walk goal today with increased pain in low back. Initiated exercises for improving lumbar mobility and motor control with good tolerance. Patient had difficulty at first with motor control for cat cow exercise but improved with practice. Would benefit from follow up review of this exercise next session. Plan to continue with exercises to improve lumbar motion and core/LE/and functional  strength as appropriate next session. Also consider manual or dry needling as appropriate. Patient would benefit from continued management of limiting condition by skilled physical therapist to address remaining impairments and functional limitations to work towards stated goals and return to PLOF or maximal functional independence.   From initial eval 07/23/2022:  Patient is a 71 y.o. male referred to outpatient physical therapy with a medical diagnosis of chronic left-sided low back pain with left-sided sciatica who presents with signs and symptoms consistent with chronic low back pain with intermittent referral to left groin and thigh above knee. Patient is very stiff in all lumbar movements and hip extension which likely contribute to his pain.  Patient presents with significant pain, joint stiffness, ROM, posture, balance, muscle performance (strength/power/endurance), and activity tolerance impairments that are limiting ability to complete usual activities such as going sit to stand after sitting, prolonged standing, prolonged sitting, difficulty with ADLs and IADLs when it is flared up, getting in and out of truck, cleaning, vacuuming, laying flat without difficulty. Patient will benefit from skilled physical therapy intervention to address current body structure impairments and activity limitations to improve function and work towards goals set in current POC in order to return to prior level of function or maximal functional improvement.      OBJECTIVE IMPAIRMENTS: decreased activity tolerance, decreased balance, decreased endurance, decreased knowledge of condition, decreased mobility, difficulty walking, decreased ROM, decreased strength, hypomobility, impaired perceived functional ability, increased muscle spasms, impaired flexibility, postural dysfunction, and pain.    ACTIVITY LIMITATIONS: carrying, lifting, bending, sitting, standing, squatting,  transfers, bed mobility, and locomotion level    PARTICIPATION LIMITATIONS: cleaning, laundry, community activity, yard work, and   usual activities such as going sit to stand after sitting, prolonged standing, prolonged sitting, difficulty with ADLs and IADLs when it is flared up, getting in and out of truick, cleaning, vacuuming, laying flat   PERSONAL FACTORS: Age, Past/current experiences, Time since onset of injury/illness/exacerbation, and 3+ comorbidities:   anoxic brain injury, heart failure and acute kidney injury (associated with two cardiac resuscitations after hospitalization for MI in April 2022), anxiety, arthritis, basal cell cancer (removed), cataracts, depression, erectile dysfunction, GERD, hyperlipidemia, hypertension, Kidney stone, neuropathy of legs, psoriasis, vitreous floater, right  and left heart catheterization, (12/2020), nodule of left lung (being monitored), type II DM, plantar fasciitis, CKD, BPH with urinary obstruction, neck pain, history of melanoma, tremor of right hand, bilateral leg edema, HTN are also affecting patient's functional outcome.    REHAB POTENTIAL: Good   CLINICAL DECISION MAKING: Stable/uncomplicated   EVALUATION COMPLEXITY: Low     GOALS: Goals reviewed with patient? No   SHORT TERM GOALS: Target date: 08/06/2022   Patient will be independent with initial home exercise program for self-management of symptoms. Baseline: Initial HEP to be provided at visit 2 as appropriate (07/23/22); Goal status: In-progress     LONG TERM GOALS: Target date: 10/15/2022   Patient will be independent with a long-term home exercise program for self-management of symptoms.  Baseline: Initial HEP to be provided at visit 2 as appropriate (07/23/22); Goal status: In-progress   2.  Patient will demonstrate improved FOTO to equal or greater than 82 by visit #8 to demonstrate improvement in overall condition and self-reported functional ability.  Baseline: 71 (07/23/22); Goal status: In-progress   3.  Patient  will ambulate equal or greater than 1729 feet with LRAD to reach aged matched norm to improve community mobility.  Baseline: to be tested visit 2 as appropriate (07/23/22); 1769 feet with no AD, building "pressure"  in low back, relieved by sitting after (07/28/2022);  Goal status: MET   4.  Patient will report pain equal or less than 2/10 during functional activities to improve his ability to complete activities such as vacuuming, shopping, and getting in and out of the truck.  Baseline: up to 10/10 (07/23/22); Goal status: In-progress   5.  Patient will complete community, work and/or recreational activities without limitation due to current condition.  Baseline: difficulty with usual activities such as going sit to stand after sitting, prolonged standing, prolonged sitting, difficulty with ADLs and IADLs when it is flared up, getting in and out of truick, cleaning, vacuuming, laying flat (07/23/22); Goal status: In-progress   PLAN:   PT FREQUENCY: 1-2x/week   PT DURATION: 12 weeks   PLANNED INTERVENTIONS: Therapeutic exercises, Therapeutic activity, Neuromuscular re-education, Balance training, Gait training, Patient/Family education, Self Care, Joint mobilization, DME instructions, Dry Needling, Electrical stimulation, Spinal mobilization, Cryotherapy, Moist heat, Manual therapy, and Re-evaluation.   PLAN FOR NEXT SESSION: update HEP as appropriate, manual therapy as needed, stretching, LE, core, and functional strengthening and balance as appropriate.      Nancy Nordmann, PT, DPT 07/28/2022, 8:10 PM  [facility] [address] [place], [place] [address] P: [phone] I F: [phone]

## 2022-07-30 ENCOUNTER — Telehealth: Payer: Self-pay | Admitting: Family Medicine

## 2022-07-30 ENCOUNTER — Ambulatory Visit: Payer: Medicare PPO | Admitting: Physical Therapy

## 2022-07-30 ENCOUNTER — Encounter: Payer: Self-pay | Admitting: Physical Therapy

## 2022-07-30 DIAGNOSIS — M5459 Other low back pain: Secondary | ICD-10-CM

## 2022-07-30 DIAGNOSIS — M6281 Muscle weakness (generalized): Secondary | ICD-10-CM | POA: Diagnosis not present

## 2022-07-30 DIAGNOSIS — G8929 Other chronic pain: Secondary | ICD-10-CM | POA: Diagnosis not present

## 2022-07-30 DIAGNOSIS — M5442 Lumbago with sciatica, left side: Secondary | ICD-10-CM | POA: Diagnosis not present

## 2022-07-30 NOTE — Telephone Encounter (Signed)
Pt called to inform MD that he received the following vaccinations and would like them recorded in his chart:  1)  RSV - 07/24/22 - [name] 2)  Covid - 06/21/22 - [name]

## 2022-07-30 NOTE — Therapy (Signed)
OUTPATIENT PHYSICAL THERAPY TREATMENT NOTE   Patient Name: [name] MRN: [id number] DOB:02-24-1951, 71 y.o., male Today's Date: 07/30/2022  PCP: [name], MD REFERRING PROVIDER: [name], MD   END OF SESSION:   PT End of Session - 07/30/22 1146     Visit Number 3    Number of Visits 24    Date for PT Re-Evaluation 10/15/22    Authorization Type [employer]IC[employer] reporting period from 07/23/2022    Authorization Time Period auth 24 visits  11/6 - 1/26  Authorization #[id number]    Authorization - Visit Number 2    Authorization - Number of Visits 24    Progress Note Due on Visit 10    PT Start Time 1125    PT Stop Time 9381    PT Time Calculation (min) 40 min    Activity Tolerance Patient tolerated treatment well    Behavior During Therapy [facility] for tasks assessed/performed              Past Medical History:  Diagnosis Date   Anoxic brain injury ([place])    Anxiety    Arthritis    Cancer ([place]) 2010   basal cell ca, head   Cataract    bilateral   Depression    ED (erectile dysfunction)    GERD (gastroesophageal reflux disease)    Heart failure ([place])    Hyperlipidemia    Hypertension    Kidney stone 07/29/2011   passed    Myocardial infarction ([place])    Neuromuscular disorder ([place])    neuropathy legs   Psoriasis    Vitreous floater    left eye, sees Dr. Dawna [facility] at [facility]    Past Surgical History:  Procedure Laterality Date   BASAL CELL CARCINOMA EXCISION     removed from scalp   COLONOSCOPY  07/03/2017   per Dr. Havery Moros, sessile serrated polyp, repeat in 5 yrs    [place] CATH AND CORONARY ANGIOGRAPHY N/A 01/03/2021   Procedure: LEFT HEART CATH AND CORONARY ANGIOGRAPHY;  Surgeon: Troy Sine, MD;  Location: [place] [facility];  Service: Cardiovascular;  Laterality: N/A;   RIGHT HEART CATH N/A 01/03/2021   Procedure: RIGHT HEART CATH;  Surgeon: Jolaine Artist, MD;  Location: [place] [facility];   Service: Cardiovascular;  Laterality: N/A;   Patient Active Problem List   Diagnosis Date Noted   CAD, multiple vessel 09/03/2021   ST elevation myocardial infarction (STEMI) ([place]) 09/03/2021   Type 2 diabetes mellitus with diabetic chronic kidney disease ([place]) 05/30/2021   CKD (chronic kidney disease), stage III ([place]) 05/30/2021   Hemoptysis    Acute hypoxemic respiratory failure (HCC)    Acute renal failure (HCC)    Encephalopathy acute    Cardiogenic shock ([place]) 01/03/2021   Atypical chest pain 10/25/2019   Coronary artery calcification seen on CAT scan 06/15/2019   Nodule of lower lobe of left lung 06/15/2019   Tremor of right hand 04/24/2017   Numbness and tingling of both legs 04/24/2017   Bilateral leg edema 06/05/2015   Chronic neck pain 06/05/2015   Plantar fasciitis 02/01/2014   Visual floaters 04/04/2013   Subjective vision disturbance, left 04/04/2013   BPH with urinary obstruction 01/01/2010   MELANOMA, HX OF 06/01/2009   NECK PAIN 09/11/2008   CELLULITIS AND ABSCESS OF LEG EXCEPT FOOT 04/26/2008   ERECTILE DYSFUNCTION 10/12/2007   Hyperlipemia, mixed 04/12/2007   Anxiety state 04/12/2007  Essential hypertension 04/12/2007   GERD 04/12/2007    REFERRING DIAG: chronic left-sided low back pain with left-sided sciatica   THERAPY DIAG:  Other low back pain  Muscle weakness (generalized)  Rationale for Evaluation and Treatment: Rehabilitation  PERTINENT HISTORY: Patient is a 71 y.o. male who presents to outpatient physical therapy with a referral for medical diagnosis chronic left-sided low back pain with left-sided sciatica. This patient's chief complaints consist of intermittent low back pain with occasional pain/paresthesia to left groin and glute leading to the following functional deficits: difficulty with usual activities such as going sit to stand after sitting, prolonged standing, prolonged sitting, difficulty with ADLs and IADLs when it is flared up,  getting in and out of truick, cleaning, vacuuming, laying flat. Relevant past medical history and comorbidities include anoxic brain injury, heart failure and acute kidney injury (associated with two cardiac resuscitations after hospitalization for MI in April 2022), anxiety, arthritis, basal cell cancer (removed), cataracts, depression, erectile dysfunction, GERD, hyperlipidemia, hypertension, Kidney stone, neuropathy of legs, psoriasis, vitreous floater, right  and left heart catheterization, (12/2020), nodule of left lung (being monitored), type II DM, plantar fasciitis, CKD, BPH with urinary obstruction, neck pain, history of melanoma, tremor of right hand, bilateral leg edema, HTN.  Patient denies hx of stroke, seizures, unexplained weight loss, unexplained changes in bowel or bladder problems, unexplained stumbling or dropping things, osteoporosis, and spinal surgery   PRECAUTIONS: watch what he does and know the signs he needs to slow down or rest, carry nitroglycerine, jogging.    SUBJECTIVE:                                                                                                                                                                                      SUBJECTIVE STATEMENT:  Patient reports he would like to review the cat-cow exercise. He states he feels like his other HEP is going well. He states he felt okay after last PT session. He is not currently having any pain. He did use the heating pad this morning. He does have a little pressure in his back right now.    PAIN:  Are you having pain? NPRS 0/10   OBJECTIVE:  TODAY'S TREATMENT:  Therapeutic exercise: to centralize symptoms and improve ROM, strength, muscular endurance, and activity tolerance required for successful completion of functional activities.  - quadruped cat-cow, 2x10 plus additional reps to learn exercise. Difficulty  with motor control at first. Improved with practice. - hooklying low trunk rotation, 1x5 each side. With discussion and cuing about form.  - standing leg swings, 2x10 each side with B UE support.  - seated lumbar flexion roll out with theraball, 1x2 min forwards, 1x2 min alternating diagonals.  - seated hip flexor stretch 3x30 seconds each side.  - standing right hip flexor stretch at stair, unable to get good stretch sensation so discontinued - hooklying hip flexor stretch off edge of table with contralateral knee held at chest, 1x30 seconds each side. - sit <> stand 1x10 with 4#DB in each hand crossed over chest.  - air squat 1x5 with wider stance while holding 4# DB at shoulders.  - squat with buttocks tap/STS at 18 inch chair with 4#DB at each shoulder, 1x10 - seated overhead press 1x10 with 4# DB in each hand - squat with buttocks tap/STS at 18 inch chair with over head press with 4#DB in each hand. 1x10.  - Education on HEP including handout   Pt required multimodal cuing for proper technique and to facilitate improved neuromuscular control, strength, range of motion, and functional ability resulting in improved performance and form.   PATIENT EDUCATION:  Education details: Exercise purpose/form. Self management techniques.  Education on HEP including handout  Reviewed cancelation/no-show policy with patient and confirmed patient has correct phone number for clinic; patient verbalized understanding (07/28/22). Person educated: Patient Education method: Explanation, Demonstration, Tactile cues, and Verbal cues Education comprehension: verbalized understanding, returned demonstration, and needs further education   HOME EXERCISE PROGRAM: Access Code: [email or web address] URL: [email or web address] Date: 07/30/2022 Prepared by: Rosita Kea  Exercises - Supine Lower Trunk Rotation  - 2 x daily - 1 sets - 20 reps - Cat Cow  - 2 x daily - 1-2 sets - 20 reps - Child's Pose Stretch  -  2 x daily - 1 sets - 20 reps - Seated Hip Flexor Stretch  - 1 x daily - 3 sets - 30 seconds hold   ASSESSMENT:   CLINICAL IMPRESSION: Patient arrives with usual low back discomfort. Tolerated treatment well overall, reporting he got a more vigorous work out by end of session. Patient reports increased low back pain with position of more lumbar extension. Patient appeared to have good understanding of HEP but would benefit from review next session. Plan to continue with interventions for improved spinal mobility and core, LE, and functional strength. Consider manual therapy next session. Patient would benefit from continued management of limiting condition by skilled physical therapist to address remaining impairments and functional limitations to work towards stated goals and return to PLOF or maximal functional independence.    From initial eval 07/23/2022:  Patient is a 71 y.o. male referred to outpatient physical therapy with a medical diagnosis of chronic left-sided low back pain with left-sided sciatica who presents with signs and symptoms consistent with chronic low back pain with intermittent referral to left groin and thigh above knee. Patient is very stiff in all lumbar movements and hip extension which likely contribute to his pain.  Patient presents with significant pain, joint stiffness, ROM, posture, balance, muscle performance (strength/power/endurance), and activity tolerance impairments that are limiting ability to complete usual activities such as going sit to stand after sitting, prolonged standing, prolonged sitting, difficulty with ADLs  and IADLs when it is flared up, getting in and out of truck, cleaning, vacuuming, laying flat without difficulty. Patient will benefit from skilled physical therapy intervention to address current body structure impairments and activity limitations to improve function and work towards goals set in current POC in order to return to prior level of function  or maximal functional improvement.      OBJECTIVE IMPAIRMENTS: decreased activity tolerance, decreased balance, decreased endurance, decreased knowledge of condition, decreased mobility, difficulty walking, decreased ROM, decreased strength, hypomobility, impaired perceived functional ability, increased muscle spasms, impaired flexibility, postural dysfunction, and pain.    ACTIVITY LIMITATIONS: carrying, lifting, bending, sitting, standing, squatting, transfers, bed mobility, and locomotion level   PARTICIPATION LIMITATIONS: cleaning, laundry, community activity, yard work, and   usual activities such as going sit to stand after sitting, prolonged standing, prolonged sitting, difficulty with ADLs and IADLs when it is flared up, getting in and out of truick, cleaning, vacuuming, laying flat   PERSONAL FACTORS: Age, Past/current experiences, Time since onset of injury/illness/exacerbation, and 3+ comorbidities:   anoxic brain injury, heart failure and acute kidney injury (associated with two cardiac resuscitations after hospitalization for MI in April 2022), anxiety, arthritis, basal cell cancer (removed), cataracts, depression, erectile dysfunction, GERD, hyperlipidemia, hypertension, Kidney stone, neuropathy of legs, psoriasis, vitreous floater, right  and left heart catheterization, (12/2020), nodule of left lung (being monitored), type II DM, plantar fasciitis, CKD, BPH with urinary obstruction, neck pain, history of melanoma, tremor of right hand, bilateral leg edema, HTN are also affecting patient's functional outcome.    REHAB POTENTIAL: Good   CLINICAL DECISION MAKING: Stable/uncomplicated   EVALUATION COMPLEXITY: Low     GOALS: Goals reviewed with patient? No   SHORT TERM GOALS: Target date: 08/06/2022   Patient will be independent with initial home exercise program for self-management of symptoms. Baseline: Initial HEP to be provided at visit 2 as appropriate (07/23/22); Goal status:  In-progress     LONG TERM GOALS: Target date: 10/15/2022   Patient will be independent with a long-term home exercise program for self-management of symptoms.  Baseline: Initial HEP to be provided at visit 2 as appropriate (07/23/22); Goal status: In-progress   2.  Patient will demonstrate improved FOTO to equal or greater than 82 by visit #8 to demonstrate improvement in overall condition and self-reported functional ability.  Baseline: 71 (07/23/22); Goal status: In-progress   3.  Patient will ambulate equal or greater than 1729 feet with LRAD to reach aged matched norm to improve community mobility.  Baseline: to be tested visit 2 as appropriate (07/23/22); 1769 feet with no AD, building "pressure"  in low back, relieved by sitting after (07/28/2022);  Goal status: MET   4.  Patient will report pain equal or less than 2/10 during functional activities to improve his ability to complete activities such as vacuuming, shopping, and getting in and out of the truck.  Baseline: up to 10/10 (07/23/22); Goal status: In-progress   5.  Patient will complete community, work and/or recreational activities without limitation due to current condition.  Baseline: difficulty with usual activities such as going sit to stand after sitting, prolonged standing, prolonged sitting, difficulty with ADLs and IADLs when it is flared up, getting in and out of truick, cleaning, vacuuming, laying flat (07/23/22); Goal status: In-progress   PLAN:   PT FREQUENCY: 1-2x/week   PT DURATION: 12 weeks   PLANNED INTERVENTIONS: Therapeutic exercises, Therapeutic activity, Neuromuscular re-education, Balance training, Gait training, Patient/Family education, Self Care, Joint  mobilization, DME instructions, Dry Needling, Electrical stimulation, Spinal mobilization, Cryotherapy, Moist heat, Manual therapy, and Re-evaluation.   PLAN FOR NEXT SESSION: update HEP as appropriate, manual therapy as needed, stretching, LE,  core, and functional strengthening and balance as appropriate.      Nancy Nordmann, PT, DPT 07/30/2022, 12:22 PM  [facility] [address] [place], [place] [address] P: [phone] I F: [phone]

## 2022-07-31 NOTE — Telephone Encounter (Signed)
Pt immunizations have been update in his chart, notified pt via MyChart

## 2022-08-04 ENCOUNTER — Encounter: Payer: Self-pay | Admitting: Physical Therapy

## 2022-08-04 ENCOUNTER — Ambulatory Visit: Payer: Medicare PPO | Admitting: Physical Therapy

## 2022-08-04 DIAGNOSIS — M5459 Other low back pain: Secondary | ICD-10-CM | POA: Diagnosis not present

## 2022-08-04 DIAGNOSIS — M6281 Muscle weakness (generalized): Secondary | ICD-10-CM

## 2022-08-04 DIAGNOSIS — M5442 Lumbago with sciatica, left side: Secondary | ICD-10-CM | POA: Diagnosis not present

## 2022-08-04 DIAGNOSIS — G8929 Other chronic pain: Secondary | ICD-10-CM | POA: Diagnosis not present

## 2022-08-04 NOTE — Therapy (Signed)
OUTPATIENT PHYSICAL THERAPY TREATMENT NOTE   Patient Name: [name] MRN: [id number] DOB:07/02/1951, 71 y.o., male Today's Date: 08/04/2022  PCP: [name], MD REFERRING PROVIDER: [name], MD   END OF SESSION:   PT End of Session - 08/04/22 1210     Visit Number 4    Number of Visits 24    Date for PT Re-Evaluation 10/15/22    Authorization Type [employer]IC[employer] reporting period from 07/23/2022    Authorization Time Period auth 24 visits  11/6 - 1/26  Authorization #[id number]    Authorization - Visit Number 3    Authorization - Number of Visits 24    Progress Note Due on Visit 10    PT Start Time 0950    PT Stop Time 1030    PT Time Calculation (min) 40 min    Activity Tolerance Patient tolerated treatment well    Behavior During Therapy [facility] for tasks assessed/performed               Past Medical History:  Diagnosis Date   Anoxic brain injury ([place])    Anxiety    Arthritis    Cancer ([place]) 2010   basal cell ca, head   Cataract    bilateral   Depression    ED (erectile dysfunction)    GERD (gastroesophageal reflux disease)    Heart failure ([place])    Hyperlipidemia    Hypertension    Kidney stone 07/29/2011   passed    Myocardial infarction ([place])    Neuromuscular disorder ([place])    neuropathy legs   Psoriasis    Vitreous floater    left eye, sees Dr. Dawna Part at [facility]    Past Surgical History:  Procedure Laterality Date   BASAL CELL CARCINOMA EXCISION     removed from scalp   COLONOSCOPY  07/03/2017   per Dr. Havery Moros, sessile serrated polyp, repeat in 5 yrs    [place] CATH AND CORONARY ANGIOGRAPHY N/A 01/03/2021   Procedure: LEFT HEART CATH AND CORONARY ANGIOGRAPHY;  Surgeon: Troy Sine, MD;  Location: [place] [facility];  Service: Cardiovascular;  Laterality: N/A;   RIGHT HEART CATH N/A 01/03/2021   Procedure: RIGHT HEART CATH;  Surgeon: Jolaine Artist, MD;  Location: [place] [facility];   Service: Cardiovascular;  Laterality: N/A;   Patient Active Problem List   Diagnosis Date Noted   CAD, multiple vessel 09/03/2021   ST elevation myocardial infarction (STEMI) ([place]) 09/03/2021   Type 2 diabetes mellitus with diabetic chronic kidney disease ([place]) 05/30/2021   CKD (chronic kidney disease), stage III ([place]) 05/30/2021   Hemoptysis    Acute hypoxemic respiratory failure (H[facility])    Acute renal failure (HCC)    Encephalopathy acute    Cardiogenic shock ([place]) 01/03/2021   Atypical chest pain 10/25/2019   Coronary artery calcification seen on CAT scan 06/15/2019   Nodule of lower lobe of left lung 06/15/2019   Tremor of right hand 04/24/2017   Numbness and tingling of both legs 04/24/2017   Bilateral leg edema 06/05/2015   Chronic neck pain 06/05/2015   Plantar fasciitis 02/01/2014   Visual floaters 04/04/2013   Subjective vision disturbance, left 04/04/2013   BPH with urinary obstruction 01/01/2010   MELANOMA, HX OF 06/01/2009   NECK PAIN 09/11/2008   CELLULITIS AND ABSCESS OF LEG EXCEPT FOOT 04/26/2008   ERECTILE DYSFUNCTION 10/12/2007   Hyperlipemia, mixed 04/12/2007   Anxiety state  04/12/2007   Essential hypertension 04/12/2007   GERD 04/12/2007    REFERRING DIAG: chronic left-sided low back pain with left-sided sciatica   THERAPY DIAG:  Other low back pain  Muscle weakness (generalized)  Rationale for Evaluation and Treatment: Rehabilitation  PERTINENT HISTORY: Patient is a 71 y.o. male who presents to outpatient physical therapy with a referral for medical diagnosis chronic left-sided low back pain with left-sided sciatica. This patient's chief complaints consist of intermittent low back pain with occasional pain/paresthesia to left groin and glute leading to the following functional deficits: difficulty with usual activities such as going sit to stand after sitting, prolonged standing, prolonged sitting, difficulty with ADLs and IADLs when it is flared up,  getting in and out of truick, cleaning, vacuuming, laying flat. Relevant past medical history and comorbidities include anoxic brain injury, heart failure and acute kidney injury (associated with two cardiac resuscitations after hospitalization for MI in April 2022), anxiety, arthritis, basal cell cancer (removed), cataracts, depression, erectile dysfunction, GERD, hyperlipidemia, hypertension, Kidney stone, neuropathy of legs, psoriasis, vitreous floater, right  and left heart catheterization, (12/2020), nodule of left lung (being monitored), type II DM, plantar fasciitis, CKD, BPH with urinary obstruction, neck pain, history of melanoma, tremor of right hand, bilateral leg edema, HTN.  Patient denies hx of stroke, seizures, unexplained weight loss, unexplained changes in bowel or bladder problems, unexplained stumbling or dropping things, osteoporosis, and spinal surgery   PRECAUTIONS: watch what he does and know the signs he needs to slow down or rest, carry nitroglycerine, jogging.    SUBJECTIVE:                                                                                                                                                                                      SUBJECTIVE STATEMENT:  Patient reports he was sore for several days after last PT session. He feels like it was muscle soreness from new exercises. He states he had this soreness in his quads and in his low back and he used a hot pack to help this. He already did his sit <> stand exercise this morning as well as some of his back stretches. He states it depends on how long he sits for how stiff he feels when he stands up - the longer he sits the stiffer he feels. He tried the hip flexor stretch on the bed, but did not feel comfortable doing it because he felt he was going to fall off.    PAIN:  Are you having pain? NPRS 0/10   OBJECTIVE:  TODAY'S TREATMENT:  Therapeutic exercise: to centralize symptoms and improve ROM, strength, muscular endurance, and activity tolerance required for successful completion of functional activities.  - seated lumbar flexion roll out with theraball, 1x2 min forwards, 1x2 min alternating diagonals.  - standing leg swings, 2x30 seconds each side with B UE support.  - standing lumbar extension over TM bar, 2x10 (pain in back each time but decreases over time, no worse).  - squat with buttocks tap/STS at 18 inch chair with 4#DB at each shoulder, 2x10. - standing pec stretch in doorway, 3x30 seconds. Cuing for form, hold time, purpose.  - quadruped cat-cow, 2x10 plus additional reps to learn exercise. Difficulty with motor control at first. Improved with practice.  Pt required multimodal cuing for proper technique and to facilitate improved neuromuscular control, strength, range of motion, and functional ability resulting in improved performance and form.   PATIENT EDUCATION:  Education details: Exercise purpose/form. Self management techniques.  Education on HEP including handout  Reviewed cancelation/no-show policy with patient and confirmed patient has correct phone number for clinic; patient verbalized understanding (07/28/22). Person educated: Patient Education method: Explanation, Demonstration, Tactile cues, and Verbal cues Education comprehension: verbalized understanding, returned demonstration, and needs further education   HOME EXERCISE PROGRAM: Access Code: [email or web address] URL: [email or web address] Date: 07/30/2022 Prepared by: Rosita Kea  Exercises - Supine Lower Trunk Rotation  - 2 x daily - 1 sets - 20 reps - Cat Cow  - 2 x daily - 1-2 sets - 20 reps - Child's Pose Stretch  - 2 x daily - 1 sets - 20 reps - Seated Hip Flexor Stretch  - 1 x daily - 3 sets - 30 seconds hold   ASSESSMENT:   CLINICAL IMPRESSION: Patient arrives with report of no low back pain but reports  discomfort since last PT session. Visit focused on improving lumbar and postural ROM and postural/LE/functional strength. Patient tolerated well including standing lumbar extension exercises that felt no worse by end of session. He continues to struggle with motor control during cat-cow exercise and would benefit from continued cuing and review at next session. Patient would benefit from continued management of limiting condition by skilled physical therapist to address remaining impairments and functional limitations to work towards stated goals and return to PLOF or maximal functional independence.    From initial eval 07/23/2022:  Patient is a 71 y.o. male referred to outpatient physical therapy with a medical diagnosis of chronic left-sided low back pain with left-sided sciatica who presents with signs and symptoms consistent with chronic low back pain with intermittent referral to left groin and thigh above knee. Patient is very stiff in all lumbar movements and hip extension which likely contribute to his pain.  Patient presents with significant pain, joint stiffness, ROM, posture, balance, muscle performance (strength/power/endurance), and activity tolerance impairments that are limiting ability to complete usual activities such as going sit to stand after sitting, prolonged standing, prolonged sitting, difficulty with ADLs and IADLs when it is flared up, getting in and out of truck, cleaning, vacuuming, laying flat without difficulty. Patient will benefit from skilled physical therapy intervention to address current body structure impairments and activity limitations to improve function and work towards goals set in current POC in order to return to prior level of function or maximal functional improvement.      OBJECTIVE IMPAIRMENTS: decreased activity tolerance, decreased balance, decreased endurance, decreased knowledge of condition, decreased mobility, difficulty walking, decreased ROM, decreased  strength, hypomobility, impaired perceived functional ability, increased muscle spasms, impaired flexibility,  postural dysfunction, and pain.    ACTIVITY LIMITATIONS: carrying, lifting, bending, sitting, standing, squatting, transfers, bed mobility, and locomotion level   PARTICIPATION LIMITATIONS: cleaning, laundry, community activity, yard work, and   usual activities such as going sit to stand after sitting, prolonged standing, prolonged sitting, difficulty with ADLs and IADLs when it is flared up, getting in and out of truick, cleaning, vacuuming, laying flat   PERSONAL FACTORS: Age, Past/current experiences, Time since onset of injury/illness/exacerbation, and 3+ comorbidities:   anoxic brain injury, heart failure and acute kidney injury (associated with two cardiac resuscitations after hospitalization for MI in April 2022), anxiety, arthritis, basal cell cancer (removed), cataracts, depression, erectile dysfunction, GERD, hyperlipidemia, hypertension, Kidney stone, neuropathy of legs, psoriasis, vitreous floater, right  and left heart catheterization, (12/2020), nodule of left lung (being monitored), type II DM, plantar fasciitis, CKD, BPH with urinary obstruction, neck pain, history of melanoma, tremor of right hand, bilateral leg edema, HTN are also affecting patient's functional outcome.    REHAB POTENTIAL: Good   CLINICAL DECISION MAKING: Stable/uncomplicated   EVALUATION COMPLEXITY: Low     GOALS: Goals reviewed with patient? No   SHORT TERM GOALS: Target date: 08/06/2022   Patient will be independent with initial home exercise program for self-management of symptoms. Baseline: Initial HEP to be provided at visit 2 as appropriate (07/23/22); Goal status: In-progress     LONG TERM GOALS: Target date: 10/15/2022   Patient will be independent with a long-term home exercise program for self-management of symptoms.  Baseline: Initial HEP to be provided at visit 2 as appropriate  (07/23/22); Goal status: In-progress   2.  Patient will demonstrate improved FOTO to equal or greater than 82 by visit #8 to demonstrate improvement in overall condition and self-reported functional ability.  Baseline: 71 (07/23/22); Goal status: In-progress   3.  Patient will ambulate equal or greater than 1729 feet with LRAD to reach aged matched norm to improve community mobility.  Baseline: to be tested visit 2 as appropriate (07/23/22); 1769 feet with no AD, building "pressure"  in low back, relieved by sitting after (07/28/2022);  Goal status: MET   4.  Patient will report pain equal or less than 2/10 during functional activities to improve his ability to complete activities such as vacuuming, shopping, and getting in and out of the truck.  Baseline: up to 10/10 (07/23/22); Goal status: In-progress   5.  Patient will complete community, work and/or recreational activities without limitation due to current condition.  Baseline: difficulty with usual activities such as going sit to stand after sitting, prolonged standing, prolonged sitting, difficulty with ADLs and IADLs when it is flared up, getting in and out of truick, cleaning, vacuuming, laying flat (07/23/22); Goal status: In-progress   PLAN:   PT FREQUENCY: 1-2x/week   PT DURATION: 12 weeks   PLANNED INTERVENTIONS: Therapeutic exercises, Therapeutic activity, Neuromuscular re-education, Balance training, Gait training, Patient/Family education, Self Care, Joint mobilization, DME instructions, Dry Needling, Electrical stimulation, Spinal mobilization, Cryotherapy, Moist heat, Manual therapy, and Re-evaluation.   PLAN FOR NEXT SESSION: update HEP as appropriate, manual therapy as needed, stretching, LE, core, and functional strengthening and balance as appropriate.      Nancy Nordmann, PT, DPT 08/04/2022, 12:13 PM  [facility] [address] [place], [place] [address] P: [phone] I F:  [phone]

## 2022-08-06 ENCOUNTER — Encounter: Payer: Self-pay | Admitting: Physical Therapy

## 2022-08-06 ENCOUNTER — Ambulatory Visit: Payer: Medicare PPO | Admitting: Physical Therapy

## 2022-08-06 DIAGNOSIS — M6281 Muscle weakness (generalized): Secondary | ICD-10-CM | POA: Diagnosis not present

## 2022-08-06 DIAGNOSIS — M5459 Other low back pain: Secondary | ICD-10-CM | POA: Diagnosis not present

## 2022-08-06 DIAGNOSIS — G8929 Other chronic pain: Secondary | ICD-10-CM | POA: Diagnosis not present

## 2022-08-06 DIAGNOSIS — M5442 Lumbago with sciatica, left side: Secondary | ICD-10-CM | POA: Diagnosis not present

## 2022-08-06 NOTE — Therapy (Signed)
OUTPATIENT PHYSICAL THERAPY TREATMENT NOTE   Patient Name: [name] MRN: [id number] DOB:09-Mar-1951, 71 y.o., male Today's Date: 08/06/2022  PCP: [name], MD REFERRING PROVIDER: [name], MD   END OF SESSION:   PT End of Session - 08/06/22 1116     Visit Number 5    Number of Visits 24    Date for PT Re-Evaluation 10/15/22    Authorization Type [employer]IC[employer] reporting period from 07/23/2022    Authorization Time Period auth 24 visits  11/6 - 1/26  Authorization #[id number]    Authorization - Visit Number 4    Authorization - Number of Visits 24    Progress Note Due on Visit 10    PT Start Time 1115    PT Stop Time 1155    PT Time Calculation (min) 40 min    Activity Tolerance Patient tolerated treatment well    Behavior During Therapy [facility] for tasks assessed/performed                Past Medical History:  Diagnosis Date   Anoxic brain injury ([place])    Anxiety    Arthritis    Cancer ([place]) 2010   basal cell ca, head   Cataract    bilateral   Depression    ED (erectile dysfunction)    GERD (gastroesophageal reflux disease)    Heart failure ([place])    Hyperlipidemia    Hypertension    Kidney stone 07/29/2011   passed    Myocardial infarction ([place])    Neuromuscular disorder ([place])    neuropathy legs   Psoriasis    Vitreous floater    left eye, sees Dr. Dawna Part at [facility]    Past Surgical History:  Procedure Laterality Date   BASAL CELL CARCINOMA EXCISION     removed from scalp   COLONOSCOPY  07/03/2017   per Dr. Havery Moros, sessile serrated polyp, repeat in 5 yrs    [place] CATH AND CORONARY ANGIOGRAPHY N/A 01/03/2021   Procedure: LEFT HEART CATH AND CORONARY ANGIOGRAPHY;  Surgeon: Troy Sine, MD;  Location: [facility];  Service: Cardiovascular;  Laterality: N/A;   RIGHT HEART CATH N/A 01/03/2021   Procedure: RIGHT HEART CATH;  Surgeon: Jolaine Artist, MD;  Location: [facility];   Service: Cardiovascular;  Laterality: N/A;   Patient Active Problem List   Diagnosis Date Noted   CAD, multiple vessel 09/03/2021   ST elevation myocardial infarction (STEMI) ([place]) 09/03/2021   Type 2 diabetes mellitus with diabetic chronic kidney disease ([place]) 05/30/2021   CKD (chronic kidney disease), stage III ([place]) 05/30/2021   Hemoptysis    Acute hypoxemic respiratory failure (HCC)    Acute renal failure (HCC)    Encephalopathy acute    Cardiogenic shock ([place]) 01/03/2021   Atypical chest pain 10/25/2019   Coronary artery calcification seen on CAT scan 06/15/2019   Nodule of lower lobe of left lung 06/15/2019   Tremor of right hand 04/24/2017   Numbness and tingling of both legs 04/24/2017   Bilateral leg edema 06/05/2015   Chronic neck pain 06/05/2015   Plantar fasciitis 02/01/2014   Visual floaters 04/04/2013   Subjective vision disturbance, left 04/04/2013   BPH with urinary obstruction 01/01/2010   MELANOMA, HX OF 06/01/2009   NECK PAIN 09/11/2008   CELLULITIS AND ABSCESS OF LEG EXCEPT FOOT 04/26/2008   ERECTILE DYSFUNCTION 10/12/2007   Hyperlipemia, mixed 04/12/2007   Anxiety  state 04/12/2007   Essential hypertension 04/12/2007   GERD 04/12/2007    REFERRING DIAG: chronic left-sided low back pain with left-sided sciatica   THERAPY DIAG:  Other low back pain  Muscle weakness (generalized)  Rationale for Evaluation and Treatment: Rehabilitation  PERTINENT HISTORY: Patient is a 71 y.o. male who presents to outpatient physical therapy with a referral for medical diagnosis chronic left-sided low back pain with left-sided sciatica. This patient's chief complaints consist of intermittent low back pain with occasional pain/paresthesia to left groin and glute leading to the following functional deficits: difficulty with usual activities such as going sit to stand after sitting, prolonged standing, prolonged sitting, difficulty with ADLs and IADLs when it is flared up,  getting in and out of truick, cleaning, vacuuming, laying flat. Relevant past medical history and comorbidities include anoxic brain injury, heart failure and acute kidney injury (associated with two cardiac resuscitations after hospitalization for MI in April 2022), anxiety, arthritis, basal cell cancer (removed), cataracts, depression, erectile dysfunction, GERD, hyperlipidemia, hypertension, Kidney stone, neuropathy of legs, psoriasis, vitreous floater, right  and left heart catheterization, (12/2020), nodule of left lung (being monitored), type II DM, plantar fasciitis, CKD, BPH with urinary obstruction, neck pain, history of melanoma, tremor of right hand, bilateral leg edema, HTN.  Patient denies hx of stroke, seizures, unexplained weight loss, unexplained changes in bowel or bladder problems, unexplained stumbling or dropping things, osteoporosis, and spinal surgery   PRECAUTIONS: watch what he does and know the signs he needs to slow down or rest, carry nitroglycerine, jogging.    SUBJECTIVE:                                                                                                                                                                                      SUBJECTIVE STATEMENT:  Patient reports he is feeling well upon arrival. He feels a little pain in his low back that is more like a pressure, especially when he is standing. He was a little sore in his quads yesterday. He was too busy to practice exercises yesterday.    PAIN:  Are you having pain? NPRS 1/10 low back.    OBJECTIVE:  TODAY'S TREATMENT:  Therapeutic exercise: to centralize symptoms and improve ROM, strength, muscular endurance, and activity tolerance required for successful completion of functional activities.  - seated lumbar flexion roll out with theraball, 1x2 min forwards, 1x2 min alternating diagonals.  - standing leg  swings, 2x30 seconds each side with B UE support.  Cuing to move pelvis more on left to get better swing.  - standing lumbar extension over TM bar, 2x10. Cuing for improved ROM/stretch.  - squat with buttocks tap/STS at 18 inch chair with 4#DB at each shoulder, 3x10. Cuing for sets/reps/rest and to try to tap chair instead of sit.  - standing pec stretch in doorway, 3x30 seconds. Cuing for form, hold time, purpose.  - prone scapular retraction in anatomical position, fingers stay on plinth, 1 breath hold, 1x10. Cuing for technique, breathing, and full ROM.   Manual therapy: to reduce pain and tissue tension, improve range of motion, neuromodulation, in order to promote improved ability to complete functional activities. PRONE - CPA throughout mid to lower thoracic spine and low back, Grade III-IV, 10-20 reps per segment + more reps near L4 and L5.   Pt required multimodal cuing for proper technique and to facilitate improved neuromuscular control, strength, range of motion, and functional ability resulting in improved performance and form.   PATIENT EDUCATION:  Education details: Exercise purpose/form. Self management techniques.  Education on HEP including handout  Reviewed cancelation/no-show policy with patient and confirmed patient has correct phone number for clinic; patient verbalized understanding (07/28/22). Person educated: Patient Education method: Explanation, Demonstration, Tactile cues, and Verbal cues Education comprehension: verbalized understanding, returned demonstration, and needs further education   HOME EXERCISE PROGRAM: Access Code: [email or web address] URL: [email or web address] Date: 07/30/2022 Prepared by: Rosita Kea  Exercises - Supine Lower Trunk Rotation  - 2 x daily - 1 sets - 20 reps - Cat Cow  - 2 x daily - 1-2 sets - 20 reps - Child's Pose Stretch  - 2 x daily - 1 sets - 20 reps - Seated Hip Flexor Stretch  - 1 x daily - 3 sets - 30 seconds hold    ASSESSMENT:   CLINICAL IMPRESSION: Patient arrives with report of very low back pain and good tolerance to last PT session. Continued with strengthening and lumbar mobility exercises as well as manual therapy to improve lumbar motion and decrease muscle tension.  Patient reported his back feels better at end of session. Patient continues to require cuing for Patient would benefit from continued management of limiting condition by skilled physical therapist to address remaining impairments and functional limitations to work towards stated goals and return to PLOF or maximal functional independence.   From initial eval 07/23/2022:  Patient is a 71 y.o. male referred to outpatient physical therapy with a medical diagnosis of chronic left-sided low back pain with left-sided sciatica who presents with signs and symptoms consistent with chronic low back pain with intermittent referral to left groin and thigh above knee. Patient is very stiff in all lumbar movements and hip extension which likely contribute to his pain.  Patient presents with significant pain, joint stiffness, ROM, posture, balance, muscle performance (strength/power/endurance), and activity tolerance impairments that are limiting ability to complete usual activities such as going sit to stand after sitting, prolonged standing, prolonged sitting, difficulty with ADLs and IADLs when it is flared up, getting in and out of truck, cleaning, vacuuming, laying flat without difficulty. Patient will benefit from skilled physical therapy intervention to address current body structure impairments and activity limitations  to improve function and work towards goals set in current POC in order to return to prior level of function or maximal functional improvement.      OBJECTIVE IMPAIRMENTS: decreased activity tolerance, decreased balance, decreased endurance, decreased knowledge of condition, decreased mobility, difficulty walking, decreased ROM, decreased  strength, hypomobility, impaired perceived functional ability, increased muscle spasms, impaired flexibility, postural dysfunction, and pain.    ACTIVITY LIMITATIONS: carrying, lifting, bending, sitting, standing, squatting, transfers, bed mobility, and locomotion level   PARTICIPATION LIMITATIONS: cleaning, laundry, community activity, yard work, and   usual activities such as going sit to stand after sitting, prolonged standing, prolonged sitting, difficulty with ADLs and IADLs when it is flared up, getting in and out of truick, cleaning, vacuuming, laying flat   PERSONAL FACTORS: Age, Past/current experiences, Time since onset of injury/illness/exacerbation, and 3+ comorbidities:   anoxic brain injury, heart failure and acute kidney injury (associated with two cardiac resuscitations after hospitalization for MI in April 2022), anxiety, arthritis, basal cell cancer (removed), cataracts, depression, erectile dysfunction, GERD, hyperlipidemia, hypertension, Kidney stone, neuropathy of legs, psoriasis, vitreous floater, right  and left heart catheterization, (12/2020), nodule of left lung (being monitored), type II DM, plantar fasciitis, CKD, BPH with urinary obstruction, neck pain, history of melanoma, tremor of right hand, bilateral leg edema, HTN are also affecting patient's functional outcome.    REHAB POTENTIAL: Good   CLINICAL DECISION MAKING: Stable/uncomplicated   EVALUATION COMPLEXITY: Low     GOALS: Goals reviewed with patient? No   SHORT TERM GOALS: Target date: 08/06/2022   Patient will be independent with initial home exercise program for self-management of symptoms. Baseline: Initial HEP to be provided at visit 2 as appropriate (07/23/22); Goal status: In-progress     LONG TERM GOALS: Target date: 10/15/2022   Patient will be independent with a long-term home exercise program for self-management of symptoms.  Baseline: Initial HEP to be provided at visit 2 as appropriate  (07/23/22); Goal status: In-progress   2.  Patient will demonstrate improved FOTO to equal or greater than 82 by visit #8 to demonstrate improvement in overall condition and self-reported functional ability.  Baseline: 71 (07/23/22); Goal status: In-progress   3.  Patient will ambulate equal or greater than 1729 feet with LRAD to reach aged matched norm to improve community mobility.  Baseline: to be tested visit 2 as appropriate (07/23/22); 1769 feet with no AD, building "pressure"  in low back, relieved by sitting after (07/28/2022);  Goal status: MET   4.  Patient will report pain equal or less than 2/10 during functional activities to improve his ability to complete activities such as vacuuming, shopping, and getting in and out of the truck.  Baseline: up to 10/10 (07/23/22); Goal status: In-progress   5.  Patient will complete community, work and/or recreational activities without limitation due to current condition.  Baseline: difficulty with usual activities such as going sit to stand after sitting, prolonged standing, prolonged sitting, difficulty with ADLs and IADLs when it is flared up, getting in and out of truick, cleaning, vacuuming, laying flat (07/23/22); Goal status: In-progress   PLAN:   PT FREQUENCY: 1-2x/week   PT DURATION: 12 weeks   PLANNED INTERVENTIONS: Therapeutic exercises, Therapeutic activity, Neuromuscular re-education, Balance training, Gait training, Patient/Family education, Self Care, Joint mobilization, DME instructions, Dry Needling, Electrical stimulation, Spinal mobilization, Cryotherapy, Moist heat, Manual therapy, and Re-evaluation.   PLAN FOR NEXT SESSION: update HEP as appropriate, manual therapy as needed, stretching, LE, core, and functional strengthening  and balance as appropriate.      Nancy Nordmann, PT, DPT 08/06/2022, 12:04 PM  [facility] [address] [place], [place] [address] P: [phone] I F:  [phone]

## 2022-08-11 ENCOUNTER — Ambulatory Visit: Payer: Medicare PPO | Admitting: Physical Therapy

## 2022-08-11 ENCOUNTER — Encounter: Payer: Medicare PPO | Admitting: Physical Therapy

## 2022-08-11 ENCOUNTER — Encounter: Payer: Self-pay | Admitting: Physical Therapy

## 2022-08-11 VITALS — BP 124/69 | HR 58

## 2022-08-11 DIAGNOSIS — M5459 Other low back pain: Secondary | ICD-10-CM | POA: Diagnosis not present

## 2022-08-11 DIAGNOSIS — N1832 Chronic kidney disease, stage 3b: Secondary | ICD-10-CM | POA: Diagnosis not present

## 2022-08-11 DIAGNOSIS — M6281 Muscle weakness (generalized): Secondary | ICD-10-CM

## 2022-08-11 DIAGNOSIS — G8929 Other chronic pain: Secondary | ICD-10-CM | POA: Diagnosis not present

## 2022-08-11 DIAGNOSIS — M5442 Lumbago with sciatica, left side: Secondary | ICD-10-CM | POA: Diagnosis not present

## 2022-08-11 NOTE — Therapy (Signed)
OUTPATIENT PHYSICAL THERAPY TREATMENT NOTE   Patient Name: [name] MRN: [id number] DOB:30-Dec-1950, 71 y.o., male Today's Date: 08/11/2022  PCP: [name], MD REFERRING PROVIDER: [name], MD   END OF SESSION:   PT End of Session - 08/11/22 1521     Visit Number 6    Number of Visits 24    Date for PT Re-Evaluation 10/15/22    Authorization Type [employer]IC[employer] reporting period from 07/23/2022    Authorization Time Period auth 24 visits  11/6 - 1/26  Authorization #[id number]    Authorization - Number of Visits 24    Progress Note Due on Visit 10    PT Start Time 1519    PT Stop Time 1194    PT Time Calculation (min) 38 min    Activity Tolerance Patient tolerated treatment well    Behavior During Therapy [facility] for tasks assessed/performed                 Past Medical History:  Diagnosis Date   Anoxic brain injury ([place])    Anxiety    Arthritis    Cancer ([place]) 2010   basal cell ca, head   Cataract    bilateral   Depression    ED (erectile dysfunction)    GERD (gastroesophageal reflux disease)    Heart failure ([place])    Hyperlipidemia    Hypertension    Kidney stone 07/29/2011   passed    Myocardial infarction ([place])    Neuromuscular disorder ([place])    neuropathy legs   Psoriasis    Vitreous floater    left eye, sees Dr. Dawna Part at [facility]    Past Surgical History:  Procedure Laterality Date   BASAL CELL CARCINOMA EXCISION     removed from scalp   COLONOSCOPY  07/03/2017   per Dr. Havery Moros, sessile serrated polyp, repeat in 5 yrs    [place] CATH AND CORONARY ANGIOGRAPHY N/A 01/03/2021   Procedure: LEFT HEART CATH AND CORONARY ANGIOGRAPHY;  Surgeon: Troy Sine, MD;  Location: [place] [facility];  Service: Cardiovascular;  Laterality: N/A;   RIGHT HEART CATH N/A 01/03/2021   Procedure: RIGHT HEART CATH;  Surgeon: Jolaine Artist, MD;  Location: [place] [facility];  Service: Cardiovascular;   Laterality: N/A;   Patient Active Problem List   Diagnosis Date Noted   CAD, multiple vessel 09/03/2021   ST elevation myocardial infarction (STEMI) ([place]) 09/03/2021   Type 2 diabetes mellitus with diabetic chronic kidney disease ([place]) 05/30/2021   CKD (chronic kidney disease), stage III ([place]) 05/30/2021   Hemoptysis    Acute hypoxemic respiratory failure ([facility])    Acute renal failure ([facility])    Encephalopathy acute    Cardiogenic shock ([place]) 01/03/2021   Atypical chest pain 10/25/2019   Coronary artery calcification seen on CAT scan 06/15/2019   Nodule of lower lobe of left lung 06/15/2019   Tremor of right hand 04/24/2017   Numbness and tingling of both legs 04/24/2017   Bilateral leg edema 06/05/2015   Chronic neck pain 06/05/2015   Plantar fasciitis 02/01/2014   Visual floaters 04/04/2013   Subjective vision disturbance, left 04/04/2013   BPH with urinary obstruction 01/01/2010   MELANOMA, HX OF 06/01/2009   NECK PAIN 09/11/2008   CELLULITIS AND ABSCESS OF LEG EXCEPT FOOT 04/26/2008   ERECTILE DYSFUNCTION 10/12/2007   Hyperlipemia, mixed 04/12/2007   Anxiety state 04/12/2007   Essential hypertension 04/12/2007  GERD 04/12/2007    REFERRING DIAG: chronic left-sided low back pain with left-sided sciatica   THERAPY DIAG:  Other low back pain  Muscle weakness (generalized)  Rationale for Evaluation and Treatment: Rehabilitation  PERTINENT HISTORY: Patient is a 71 y.o. male who presents to outpatient physical therapy with a referral for medical diagnosis chronic left-sided low back pain with left-sided sciatica. This patient's chief complaints consist of intermittent low back pain with occasional pain/paresthesia to left groin and glute leading to the following functional deficits: difficulty with usual activities such as going sit to stand after sitting, prolonged standing, prolonged sitting, difficulty with ADLs and IADLs when it is flared up, getting in and out of truick,  cleaning, vacuuming, laying flat. Relevant past medical history and comorbidities include anoxic brain injury, heart failure and acute kidney injury (associated with two cardiac resuscitations after hospitalization for MI in April 2022), anxiety, arthritis, basal cell cancer (removed), cataracts, depression, erectile dysfunction, GERD, hyperlipidemia, hypertension, Kidney stone, neuropathy of legs, psoriasis, vitreous floater, right  and left heart catheterization, (12/2020), nodule of left lung (being monitored), type II DM, plantar fasciitis, CKD, BPH with urinary obstruction, neck pain, history of melanoma, tremor of right hand, bilateral leg edema, HTN.  Patient denies hx of stroke, seizures, unexplained weight loss, unexplained changes in bowel or bladder problems, unexplained stumbling or dropping things, osteoporosis, and spinal surgery   PRECAUTIONS: watch what he does and know the signs he needs to slow down or rest, carry nitroglycerine, jogging.    SUBJECTIVE:                                                                                                                                                                                      SUBJECTIVE STATEMENT:  Patient reports his pain was okay until he had a shooting pain in his low back that went away quickly when he bent over to spit in the sink after gargling. He would like PT to take his blood pressure today because he has noticed a trend of it being a little higher than he likes. He states the shooting pain he had recently was the first he has had since starting PT.    PAIN:  Are you having pain? No pain    OBJECTIVE  SELF-REPORTED FUNCTION FOTO score: 71/100 (lumbar questionnaire)   Vitals:   08/11/22 1526  BP: 124/69  Pulse: (!) 58  SpO2: 100%    TODAY'S TREATMENT:  Therapeutic exercise: to centralize symptoms and improve ROM,  strength, muscular endurance, and activity tolerance required for successful completion of functional activities.  - vitals measurement per patient request (see above).  - seated lumbar flexion roll out with theraball, 1x2 min forwards, 1x2 min alternating diagonals.  - standing leg swings, 3x30 seconds each side with B UE support.  Better carry over from last session . - standing lumbar extension over TM bar, 2x10. Cuing for improved ROM/stretch and hold.  - standing pec stretch in doorway, 3x30 seconds. Cuing for form, hold time, region he should feel stretch.  - squat with buttocks tap/STS at 18 inch chair with 4#DB at each shoulder, 3x10. Cuing for sets/reps/rest and to try to tap chair instead of sit.  - prone scapular retraction in anatomical position, fingers lift a few inches from plinth, 1 breath hold, 2x10. Cuing for technique, breathing, and full ROM.   Manual therapy: to reduce pain and tissue tension, improve range of motion, neuromodulation, in order to promote improved ability to complete functional activities. PRONE - CPA throughout mid to lower thoracic spine and low back, Grade III-IV, ~10 reps per segment + more reps near L4 and L5.   Pt required multimodal cuing for proper technique and to facilitate improved neuromuscular control, strength, range of motion, and functional ability resulting in improved performance and form.   PATIENT EDUCATION:  Education details: Exercise purpose/form. Self management techniques.  Education on HEP including handout  Reviewed cancelation/no-show policy with patient and confirmed patient has correct phone number for clinic; patient verbalized understanding (07/28/22). Person educated: Patient Education method: Explanation, Demonstration, Tactile cues, and Verbal cues Education comprehension: verbalized understanding, returned demonstration, and needs further education   HOME EXERCISE PROGRAM: Access Code: [email or web address] URL:  [email or web address] Date: 07/30/2022 Prepared by: Rosita Kea  Exercises - Supine Lower Trunk Rotation  - 2 x daily - 1 sets - 20 reps - Cat Cow  - 2 x daily - 1-2 sets - 20 reps - Child's Pose Stretch  - 2 x daily - 1 sets - 20 reps - Seated Hip Flexor Stretch  - 1 x daily - 3 sets - 30 seconds hold   ASSESSMENT:   CLINICAL IMPRESSION: Patient arrives with report of no pain except one instance of shooting pain when bending forwards today. Continued with exercise for increasing lumbar and thoracic mobility.  Patient demonstrated improved carry over for previously learned exercises but continued to require cuing for optimal form, breathing, ROM and exercise selection. Plan to continue with exercises for lumbar and thoracic mobilty and strength with goal of improving independence in HEP to progress to independent management. Patient would benefit from continued management of limiting condition by skilled physical therapist to address remaining impairments and functional limitations to work towards stated goals and return to PLOF or maximal functional independence.   From initial eval 07/23/2022:  Patient is a 71 y.o. male referred to outpatient physical therapy with a medical diagnosis of chronic left-sided low back pain with left-sided sciatica who presents with signs and symptoms consistent with chronic low back pain with intermittent referral to left groin and thigh above knee. Patient is very stiff in all lumbar movements and hip extension which likely contribute to his pain.  Patient presents with significant pain, joint stiffness, ROM, posture, balance, muscle performance (strength/power/endurance), and activity tolerance impairments that are limiting ability to complete usual activities such as going sit to stand after sitting, prolonged standing, prolonged sitting, difficulty with ADLs and IADLs when  it is flared up, getting in and out of truck, cleaning, vacuuming, laying flat  without difficulty. Patient will benefit from skilled physical therapy intervention to address current body structure impairments and activity limitations to improve function and work towards goals set in current POC in order to return to prior level of function or maximal functional improvement.      OBJECTIVE IMPAIRMENTS: decreased activity tolerance, decreased balance, decreased endurance, decreased knowledge of condition, decreased mobility, difficulty walking, decreased ROM, decreased strength, hypomobility, impaired perceived functional ability, increased muscle spasms, impaired flexibility, postural dysfunction, and pain.    ACTIVITY LIMITATIONS: carrying, lifting, bending, sitting, standing, squatting, transfers, bed mobility, and locomotion level   PARTICIPATION LIMITATIONS: cleaning, laundry, community activity, yard work, and   usual activities such as going sit to stand after sitting, prolonged standing, prolonged sitting, difficulty with ADLs and IADLs when it is flared up, getting in and out of truick, cleaning, vacuuming, laying flat   PERSONAL FACTORS: Age, Past/current experiences, Time since onset of injury/illness/exacerbation, and 3+ comorbidities:   anoxic brain injury, heart failure and acute kidney injury (associated with two cardiac resuscitations after hospitalization for MI in April 2022), anxiety, arthritis, basal cell cancer (removed), cataracts, depression, erectile dysfunction, GERD, hyperlipidemia, hypertension, Kidney stone, neuropathy of legs, psoriasis, vitreous floater, right  and left heart catheterization, (12/2020), nodule of left lung (being monitored), type II DM, plantar fasciitis, CKD, BPH with urinary obstruction, neck pain, history of melanoma, tremor of right hand, bilateral leg edema, HTN are also affecting patient's functional outcome.    REHAB POTENTIAL: Good   CLINICAL DECISION MAKING: Stable/uncomplicated   EVALUATION COMPLEXITY: Low      GOALS: Goals reviewed with patient? No   SHORT TERM GOALS: Target date: 08/06/2022   Patient will be independent with initial home exercise program for self-management of symptoms. Baseline: Initial HEP to be provided at visit 2 as appropriate (07/23/22); Goal status: In-progress     LONG TERM GOALS: Target date: 10/15/2022   Patient will be independent with a long-term home exercise program for self-management of symptoms.  Baseline: Initial HEP to be provided at visit 2 as appropriate (07/23/22); Goal status: In-progress   2.  Patient will demonstrate improved FOTO to equal or greater than 82 by visit #8 to demonstrate improvement in overall condition and self-reported functional ability.  Baseline: 71 (07/23/22); Goal status: In-progress   3.  Patient will ambulate equal or greater than 1729 feet with LRAD to reach aged matched norm to improve community mobility.  Baseline: to be tested visit 2 as appropriate (07/23/22); 1769 feet with no AD, building "pressure"  in low back, relieved by sitting after (07/28/2022);  Goal status: MET   4.  Patient will report pain equal or less than 2/10 during functional activities to improve his ability to complete activities such as vacuuming, shopping, and getting in and out of the truck.  Baseline: up to 10/10 (07/23/22); Goal status: In-progress   5.  Patient will complete community, work and/or recreational activities without limitation due to current condition.  Baseline: difficulty with usual activities such as going sit to stand after sitting, prolonged standing, prolonged sitting, difficulty with ADLs and IADLs when it is flared up, getting in and out of truick, cleaning, vacuuming, laying flat (07/23/22); Goal status: In-progress   PLAN:   PT FREQUENCY: 1-2x/week   PT DURATION: 12 weeks   PLANNED INTERVENTIONS: Therapeutic exercises, Therapeutic activity, Neuromuscular re-education, Balance training, Gait training, Patient/Family  education, Self Care, Joint mobilization, DME  instructions, Dry Needling, Electrical stimulation, Spinal mobilization, Cryotherapy, Moist heat, Manual therapy, and Re-evaluation.   PLAN FOR NEXT SESSION: update HEP as appropriate, manual therapy as needed, stretching, LE, core, and functional strengthening and balance as appropriate.      Nancy Nordmann, PT, DPT 08/11/2022, 4:13 PM  [facility] [address] [place], [place] [address] P: [phone] I F: [phone]

## 2022-08-18 ENCOUNTER — Ambulatory Visit: Payer: Medicare PPO | Admitting: Family Medicine

## 2022-08-18 ENCOUNTER — Encounter: Payer: Self-pay | Admitting: Physical Therapy

## 2022-08-18 ENCOUNTER — Ambulatory Visit: Payer: Medicare PPO | Admitting: Physical Therapy

## 2022-08-18 ENCOUNTER — Encounter: Payer: Self-pay | Admitting: Family Medicine

## 2022-08-18 VITALS — BP 118/74 | HR 72 | Temp 97.9°F | Wt 185.0 lb

## 2022-08-18 DIAGNOSIS — F411 Generalized anxiety disorder: Secondary | ICD-10-CM

## 2022-08-18 DIAGNOSIS — M6281 Muscle weakness (generalized): Secondary | ICD-10-CM

## 2022-08-18 DIAGNOSIS — I10 Essential (primary) hypertension: Secondary | ICD-10-CM

## 2022-08-18 DIAGNOSIS — M5459 Other low back pain: Secondary | ICD-10-CM | POA: Diagnosis not present

## 2022-08-18 DIAGNOSIS — N1832 Chronic kidney disease, stage 3b: Secondary | ICD-10-CM

## 2022-08-18 DIAGNOSIS — E1122 Type 2 diabetes mellitus with diabetic chronic kidney disease: Secondary | ICD-10-CM

## 2022-08-18 DIAGNOSIS — G8929 Other chronic pain: Secondary | ICD-10-CM | POA: Diagnosis not present

## 2022-08-18 DIAGNOSIS — I2510 Atherosclerotic heart disease of native coronary artery without angina pectoris: Secondary | ICD-10-CM

## 2022-08-18 DIAGNOSIS — M5442 Lumbago with sciatica, left side: Secondary | ICD-10-CM | POA: Diagnosis not present

## 2022-08-18 MED ORDER — LORAZEPAM 2 MG PO TABS: 2.0000 mg | ORAL_TABLET | Freq: Four times a day (QID) | ORAL | 5 refills | Status: DC | PRN

## 2022-08-18 NOTE — Progress Notes (Signed)
   Subjective:    Patient ID: [name], male    DOB: [birth year], 71 y.o.   MRN: [id number]  HPI Here to follow up. He feels great and has no concerns. He has had 7 sessions of PT for his back pain, and this has improved quite a bit. He walks daily for exercise. His BP at home is stable. He recently saw his nephrologist, and his GFR is back up to 50.    Review of Systems  Constitutional: Negative.   Respiratory: Negative.    Cardiovascular: Negative.   Musculoskeletal: Negative.   Psychiatric/Behavioral: Negative.         Objective:   Physical Exam Constitutional:      Appearance: Normal appearance.  Cardiovascular:     Rate and Rhythm: Normal rate and regular rhythm.     Pulses: Normal pulses.     Heart sounds: Normal heart sounds.  Pulmonary:     Effort: Pulmonary effort is normal.     Breath sounds: Normal breath sounds.  Neurological:     Mental Status: He is alert.           Assessment & Plan:  He is doing well with back pain, anxiety, CKD, and diabetes. He will return for his well exam in March. [name] Penna, MD

## 2022-08-18 NOTE — Therapy (Signed)
OUTPATIENT PHYSICAL THERAPY TREATMENT NOTE   Patient Name: [name] MRN: [id number] DOB:1951-01-22, 71 y.o., male Today's Date: 08/18/2022  PCP: [name], MD REFERRING PROVIDER: [name], MD   END OF SESSION:   PT End of Session - 08/18/22 0948     Visit Number 7    Number of Visits 24    Date for PT Re-Evaluation 10/15/22    Authorization Type [employer]IC[employer] reporting period from 07/23/2022    Authorization Time Period auth 24 visits  11/6 - 1/26  Authorization #[id number]    Authorization - Visit Number 6    Authorization - Number of Visits 24    Progress Note Due on Visit 10    PT Start Time 0947    PT Stop Time 1040    PT Time Calculation (min) 53 min    Activity Tolerance Patient tolerated treatment well    Behavior During Therapy [facility] for tasks assessed/performed                  Past Medical History:  Diagnosis Date   Anoxic brain injury ([place])    Anxiety    Arthritis    Cancer ([place]) 2010   basal cell ca, head   Cataract    bilateral   Depression    ED (erectile dysfunction)    GERD (gastroesophageal reflux disease)    Heart failure ([place])    Hyperlipidemia    Hypertension    Kidney stone 07/29/2011   passed    Myocardial infarction ([place])    Neuromuscular disorder ([place])    neuropathy legs   Psoriasis    Vitreous floater    left eye, sees Dr. Dawna Part at [facility]    Past Surgical History:  Procedure Laterality Date   BASAL CELL CARCINOMA EXCISION     removed from scalp   COLONOSCOPY  07/03/2017   per Dr. Havery Moros, sessile serrated polyp, repeat in 5 yrs    [place] CATH AND CORONARY ANGIOGRAPHY N/A 01/03/2021   Procedure: LEFT HEART CATH AND CORONARY ANGIOGRAPHY;  Surgeon: Troy Sine, MD;  Location: [facility];  Service: Cardiovascular;  Laterality: N/A;   RIGHT HEART CATH N/A 01/03/2021   Procedure: RIGHT HEART CATH;  Surgeon: Jolaine Artist, MD;  Location: [facility];  Service: Cardiovascular;  Laterality: N/A;   Patient Active Problem List   Diagnosis Date Noted   CAD, multiple vessel 09/03/2021   ST elevation myocardial infarction (STEMI) ([place]) 09/03/2021   Type 2 diabetes mellitus with diabetic chronic kidney disease ([place]) 05/30/2021   CKD (chronic kidney disease), stage III ([place]) 05/30/2021   Hemoptysis    Acute hypoxemic respiratory failure ([facility])    Acute renal failure ([facility])    Encephalopathy acute    Cardiogenic shock ([place]) 01/03/2021   Atypical chest pain 10/25/2019   Coronary artery calcification seen on CAT scan 06/15/2019   Nodule of lower lobe of left lung 06/15/2019   Tremor of right hand 04/24/2017   Numbness and tingling of both legs 04/24/2017   Bilateral leg edema 06/05/2015   Chronic neck pain 06/05/2015   Plantar fasciitis 02/01/2014   Visual floaters 04/04/2013   Subjective vision disturbance, left 04/04/2013   BPH with urinary obstruction 01/01/2010   MELANOMA, HX OF 06/01/2009   NECK PAIN 09/11/2008   CELLULITIS AND ABSCESS OF LEG EXCEPT FOOT 04/26/2008   ERECTILE DYSFUNCTION 10/12/2007   Hyperlipemia, mixed 04/12/2007  Anxiety state 04/12/2007   Essential hypertension 04/12/2007   GERD 04/12/2007    REFERRING DIAG: chronic left-sided low back pain with left-sided sciatica   THERAPY DIAG:  Other low back pain  Muscle weakness (generalized)  Rationale for Evaluation and Treatment: Rehabilitation  PERTINENT HISTORY: Patient is a 71 y.o. male who presents to outpatient physical therapy with a referral for medical diagnosis chronic left-sided low back pain with left-sided sciatica. This patient's chief complaints consist of intermittent low back pain with occasional pain/paresthesia to left groin and glute leading to the following functional deficits: difficulty with usual activities such as going sit to stand after sitting, prolonged standing, prolonged sitting, difficulty with ADLs and IADLs when it is flared up,  getting in and out of truick, cleaning, vacuuming, laying flat. Relevant past medical history and comorbidities include anoxic brain injury, heart failure and acute kidney injury (associated with two cardiac resuscitations after hospitalization for MI in April 2022), anxiety, arthritis, basal cell cancer (removed), cataracts, depression, erectile dysfunction, GERD, hyperlipidemia, hypertension, Kidney stone, neuropathy of legs, psoriasis, vitreous floater, right  and left heart catheterization, (12/2020), nodule of left lung (being monitored), type II DM, plantar fasciitis, CKD, BPH with urinary obstruction, neck pain, history of melanoma, tremor of right hand, bilateral leg edema, HTN.  Patient denies hx of stroke, seizures, unexplained weight loss, unexplained changes in bowel or bladder problems, unexplained stumbling or dropping things, osteoporosis, and spinal surgery   PRECAUTIONS: watch what he does and know the signs he needs to slow down or rest, carry nitroglycerine, jogging.    SUBJECTIVE:                                                                                                                                                                                      SUBJECTIVE STATEMENT:  Patient reports he is feeling well but he had some increased pain in the left groin and right lateral upper thigh. He thinks this is because he did his hip flexor stretch in his captain's chair instead of the chair he usually uses. He felt this on Saturday morning when he got up. He also felt that a bit yesterday mornings. On Saturday he went on 2 long walks instead of leg exercises due to the pain. He also used a heating pad on those areas and took an extra acetaminophen. He states he still has some limitations in prolonged walking and standing. He was able to walk around the park yesterday without increased back pain.    PAIN:  Are you having pain? No pain    OBJECTIVE  TODAY'S TREATMENT:  Therapeutic exercise: to centralize symptoms and improve ROM, strength, muscular endurance, and activity tolerance required for successful completion of functional activities.  - seated hip flexor stretch at chair, 3x30-60 seconds. Cuing for hold time, form to improve hip flexion.  - review of how patient gets in and out of truck: patient shown how to enter with less lumbar flexion with sit and pivot technique. None of the ways he does this cause pain currently. Educated patient on recommendation to not modify transfer technique if it feels okay but keep options in mid for if he does have recurrence of back pain.  - seated lumbar flexion roll out with theraball, 1x4 min forwards, and alternating diagonals.  - standing leg swings, 3x30 seconds each side with B UE support.  Required cuing for time; reassurance for pulling in back.  - reverse lunge with airex pad under knee and ipsilateral UE support on TM bar, 2x10 each side. Cuing for form, chest up, light tap of knee, free hand on hip, sets, and reps.  - standing lumbar extension over TM bar, 2x10. Cuing for improved open chest.  - standing lumbar extension with hands at pelvis, 1x5 (reports less significant stretch than with TM bar behind).  - wall sags, 1x10, 5 second hold. Cuing to learn exercise.  - standing pec stretch in doorway, 3x30 seconds. Cuing for form, hold time, region he should feel stretch.  - standing scapular retraction in anatomical position at wall, 1x10 with 5 second hold.. Cuing for technique, full ROM.  - Education on HEP including handout   Pt required multimodal cuing for proper technique and to facilitate improved neuromuscular control, strength, range of motion, and functional ability resulting in improved performance and form.   PATIENT EDUCATION:  Education details: Exercise purpose/form. Self management techniques.  Education on HEP including  handout  Reviewed cancelation/no-show policy with patient and confirmed patient has correct phone number for clinic; patient verbalized understanding (07/28/22). Person educated: Patient Education method: Explanation, Demonstration, Tactile cues, and Verbal cues Education comprehension: verbalized understanding, returned demonstration, and needs further education   HOME EXERCISE PROGRAM: Access Code: [email or web address] URL: [email or web address] Date: 08/18/2022 Prepared by: Rosita Kea  Exercises - Supine Lower Trunk Rotation  - 2 x daily - 1 sets - 20 reps - Cat Cow  - 2 x daily - 1-2 sets - 20 reps - Child's Pose Stretch  - 2 x daily - 1 sets - 20 reps - Seated Hip Flexor Stretch  - 1 x daily - 3 sets - 30 seconds hold - Leg Swings Side to Side  - 3 x weekly - 3 sets - 20 reps - Standing Lumbar Extension with Counter  - 3 x weekly - 2 sets - 10 reps - Standing Lumbar Extension at Wall - Forearms  - 3 x weekly - 2 sets - 10 reps - 1 second hold - Doorway Pec Stretch at 90 Degrees Abduction  - 3 x weekly - 3 reps - 30 seconds hold - Standing Anatomical Position with Scapular Retraction and Depression at Wall  - 3 x weekly - 2-3 sets - 10 reps - 5 seconds hold - Reverse Lunge  - 3 x weekly - 2-3 sets - 10 reps   ASSESSMENT:   CLINICAL IMPRESSION: Patient arrives with report of good pain control and improvements with tolerance of household ADLs. Continues to report pain with prolonged standing especially. Session focused on continued improvement and independence for lumbar mobility and LE and functional strength. Patient continues to  need cuing for correct form, confidence in movement, and reassurance he can continue to complete functional activities that are not currently painful. Patient appears to be approaching readiness for discharge and he was provided with an updated HEP that includes exercises performed in clinic as requested by patient to incorporate into his personal exercise  routine. Plan to continue working on confidence, lumbar mobility, and functional strength while continuing to encourage improved carry over for cuing as tolerated. Patient would benefit from continued management of limiting condition by skilled physical therapist to address remaining impairments and functional limitations to work towards stated goals and return to PLOF or maximal functional independence.   From initial eval 07/23/2022:  Patient is a 71 y.o. male referred to outpatient physical therapy with a medical diagnosis of chronic left-sided low back pain with left-sided sciatica who presents with signs and symptoms consistent with chronic low back pain with intermittent referral to left groin and thigh above knee. Patient is very stiff in all lumbar movements and hip extension which likely contribute to his pain.  Patient presents with significant pain, joint stiffness, ROM, posture, balance, muscle performance (strength/power/endurance), and activity tolerance impairments that are limiting ability to complete usual activities such as going sit to stand after sitting, prolonged standing, prolonged sitting, difficulty with ADLs and IADLs when it is flared up, getting in and out of truck, cleaning, vacuuming, laying flat without difficulty. Patient will benefit from skilled physical therapy intervention to address current body structure impairments and activity limitations to improve function and work towards goals set in current POC in order to return to prior level of function or maximal functional improvement.      OBJECTIVE IMPAIRMENTS: decreased activity tolerance, decreased balance, decreased endurance, decreased knowledge of condition, decreased mobility, difficulty walking, decreased ROM, decreased strength, hypomobility, impaired perceived functional ability, increased muscle spasms, impaired flexibility, postural dysfunction, and pain.    ACTIVITY LIMITATIONS: carrying, lifting, bending,  sitting, standing, squatting, transfers, bed mobility, and locomotion level   PARTICIPATION LIMITATIONS: cleaning, laundry, community activity, yard work, and   usual activities such as going sit to stand after sitting, prolonged standing, prolonged sitting, difficulty with ADLs and IADLs when it is flared up, getting in and out of truick, cleaning, vacuuming, laying flat   PERSONAL FACTORS: Age, Past/current experiences, Time since onset of injury/illness/exacerbation, and 3+ comorbidities:   anoxic brain injury, heart failure and acute kidney injury (associated with two cardiac resuscitations after hospitalization for MI in April 2022), anxiety, arthritis, basal cell cancer (removed), cataracts, depression, erectile dysfunction, GERD, hyperlipidemia, hypertension, Kidney stone, neuropathy of legs, psoriasis, vitreous floater, right  and left heart catheterization, (12/2020), nodule of left lung (being monitored), type II DM, plantar fasciitis, CKD, BPH with urinary obstruction, neck pain, history of melanoma, tremor of right hand, bilateral leg edema, HTN are also affecting patient's functional outcome.    REHAB POTENTIAL: Good   CLINICAL DECISION MAKING: Stable/uncomplicated   EVALUATION COMPLEXITY: Low     GOALS: Goals reviewed with patient? No   SHORT TERM GOALS: Target date: 08/06/2022   Patient will be independent with initial home exercise program for self-management of symptoms. Baseline: Initial HEP to be provided at visit 2 as appropriate (07/23/22); Goal status: In-progress     LONG TERM GOALS: Target date: 10/15/2022   Patient will be independent with a long-term home exercise program for self-management of symptoms.  Baseline: Initial HEP to be provided at visit 2 as appropriate (07/23/22); Goal status: In-progress   2.  Patient  will demonstrate improved FOTO to equal or greater than 82 by visit #8 to demonstrate improvement in overall condition and self-reported functional  ability.  Baseline: 72 (07/23/22); Goal status: In-progress   3.  Patient will ambulate equal or greater than 1729 feet with LRAD to reach aged matched norm to improve community mobility.  Baseline: to be tested visit 2 as appropriate (07/23/22); 1769 feet with no AD, building "pressure"  in low back, relieved by sitting after (07/28/2022);  Goal status: MET   4.  Patient will report pain equal or less than 2/10 during functional activities to improve his ability to complete activities such as vacuuming, shopping, and getting in and out of the truck.  Baseline: up to 10/10 (07/23/22); Goal status: In-progress   5.  Patient will complete community, work and/or recreational activities without limitation due to current condition.  Baseline: difficulty with usual activities such as going sit to stand after sitting, prolonged standing, prolonged sitting, difficulty with ADLs and IADLs when it is flared up, getting in and out of truck, cleaning, vacuuming, laying flat (07/23/22); Goal status: In-progress   PLAN:   PT FREQUENCY: 1-2x/week   PT DURATION: 12 weeks   PLANNED INTERVENTIONS: Therapeutic exercises, Therapeutic activity, Neuromuscular re-education, Balance training, Gait training, Patient/Family education, Self Care, Joint mobilization, DME instructions, Dry Needling, Electrical stimulation, Spinal mobilization, Cryotherapy, Moist heat, Manual therapy, and Re-evaluation.   PLAN FOR NEXT SESSION: update HEP as appropriate, manual therapy as needed, stretching, LE, core, and functional strengthening and balance as appropriate.      Nancy Nordmann, PT, DPT 08/18/2022, 10:44 AM  [facility] [address] [place], [place] [address] P: [phone] I F: [phone]

## 2022-08-19 ENCOUNTER — Encounter: Payer: Medicare PPO | Admitting: Physical Therapy

## 2022-08-20 ENCOUNTER — Encounter: Payer: Medicare PPO | Admitting: Physical Therapy

## 2022-08-20 DIAGNOSIS — N179 Acute kidney failure, unspecified: Secondary | ICD-10-CM | POA: Diagnosis not present

## 2022-08-20 DIAGNOSIS — D631 Anemia in chronic kidney disease: Secondary | ICD-10-CM | POA: Diagnosis not present

## 2022-08-20 DIAGNOSIS — N1832 Chronic kidney disease, stage 3b: Secondary | ICD-10-CM | POA: Diagnosis not present

## 2022-08-20 DIAGNOSIS — N2581 Secondary hyperparathyroidism of renal origin: Secondary | ICD-10-CM | POA: Diagnosis not present

## 2022-08-20 DIAGNOSIS — I130 Hypertensive heart and chronic kidney disease with heart failure and stage 1 through stage 4 chronic kidney disease, or unspecified chronic kidney disease: Secondary | ICD-10-CM | POA: Diagnosis not present

## 2022-08-20 DIAGNOSIS — I5020 Unspecified systolic (congestive) heart failure: Secondary | ICD-10-CM | POA: Diagnosis not present

## 2022-08-21 ENCOUNTER — Encounter: Payer: Medicare PPO | Admitting: Physical Therapy

## 2022-08-25 ENCOUNTER — Ambulatory Visit: Payer: Medicare PPO | Admitting: Physical Therapy

## 2022-08-26 ENCOUNTER — Encounter: Payer: Medicare PPO | Admitting: Physical Therapy

## 2022-08-27 ENCOUNTER — Ambulatory Visit: Payer: Medicare PPO | Attending: Family Medicine | Admitting: Physical Therapy

## 2022-08-27 ENCOUNTER — Encounter: Payer: Self-pay | Admitting: Physical Therapy

## 2022-08-27 DIAGNOSIS — M5459 Other low back pain: Secondary | ICD-10-CM | POA: Diagnosis not present

## 2022-08-27 DIAGNOSIS — M6281 Muscle weakness (generalized): Secondary | ICD-10-CM | POA: Insufficient documentation

## 2022-08-27 NOTE — Therapy (Signed)
OUTPATIENT PHYSICAL THERAPY TREATMENT NOTE   Patient Name: [name] MRN: [id number] DOB:Jan 10, 1951, 71 y.o., male Today's Date: 08/27/2022  PCP: [name], MD REFERRING PROVIDER: [name], MD   END OF SESSION:   PT End of Session - 08/27/22 0944     Visit Number 8    Number of Visits 24    Date for PT Re-Evaluation 10/15/22    Authorization Type [employer]IC[employer] reporting period from 07/23/2022    Authorization Time Period auth 24 visits  11/6 - 1/26  Authorization #[id number]    Authorization - Visit Number 7    Authorization - Number of Visits 24    Progress Note Due on Visit 10    PT Start Time 0945    PT Stop Time 1033    PT Time Calculation (min) 48 min    Activity Tolerance Patient tolerated treatment well    Behavior During Therapy [facility] for tasks assessed/performed                   Past Medical History:  Diagnosis Date   Anoxic brain injury ([place])    Anxiety    Arthritis    Cancer ([place]) 2010   basal cell ca, head   Cataract    bilateral   Depression    ED (erectile dysfunction)    GERD (gastroesophageal reflux disease)    Heart failure ([place])    Hyperlipidemia    Hypertension    Kidney stone 07/29/2011   passed    Myocardial infarction ([place])    Neuromuscular disorder ([place])    neuropathy legs   Psoriasis    Vitreous floater    left eye, sees Dr. Dawna Part at [facility]    Past Surgical History:  Procedure Laterality Date   BASAL CELL CARCINOMA EXCISION     removed from scalp   COLONOSCOPY  07/03/2017   per Dr. Havery Moros, sessile serrated polyp, repeat in 5 yrs    [place] CATH AND CORONARY ANGIOGRAPHY N/A 01/03/2021   Procedure: LEFT HEART CATH AND CORONARY ANGIOGRAPHY;  Surgeon: Troy Sine, MD;  Location: [place] [facility];  Service: Cardiovascular;  Laterality: N/A;   RIGHT HEART CATH N/A 01/03/2021   Procedure: RIGHT HEART CATH;  Surgeon: Jolaine Artist, MD;  Location: [facility] [facility];  Service: Cardiovascular;  Laterality: N/A;   Patient Active Problem List   Diagnosis Date Noted   CAD, multiple vessel 09/03/2021   ST elevation myocardial infarction (STEMI) ([place]) 09/03/2021   Type 2 diabetes mellitus with diabetic chronic kidney disease ([place]) 05/30/2021   CKD (chronic kidney disease), stage III ([place]) 05/30/2021   Hemoptysis    Acute hypoxemic respiratory failure (HCC)    Acute renal failure (HCC)    Encephalopathy acute    Cardiogenic shock ([place]) 01/03/2021   Atypical chest pain 10/25/2019   Coronary artery calcification seen on CAT scan 06/15/2019   Nodule of lower lobe of left lung 06/15/2019   Tremor of right hand 04/24/2017   Numbness and tingling of both legs 04/24/2017   Bilateral leg edema 06/05/2015   Chronic neck pain 06/05/2015   Plantar fasciitis 02/01/2014   Visual floaters 04/04/2013   Subjective vision disturbance, left 04/04/2013   BPH with urinary obstruction 01/01/2010   MELANOMA, HX OF 06/01/2009   NECK PAIN 09/11/2008   CELLULITIS AND ABSCESS OF LEG EXCEPT FOOT 04/26/2008   ERECTILE DYSFUNCTION 10/12/2007   Hyperlipemia, mixed 04/12/2007  Anxiety state 04/12/2007   Essential hypertension 04/12/2007   GERD 04/12/2007    REFERRING DIAG: chronic left-sided low back pain with left-sided sciatica   THERAPY DIAG:  Other low back pain  Muscle weakness (generalized)  Rationale for Evaluation and Treatment: Rehabilitation  PERTINENT HISTORY: Patient is a 71 y.o. male who presents to outpatient physical therapy with a referral for medical diagnosis chronic left-sided low back pain with left-sided sciatica. This patient's chief complaints consist of intermittent low back pain with occasional pain/paresthesia to left groin and glute leading to the following functional deficits: difficulty with usual activities such as going sit to stand after sitting, prolonged standing, prolonged sitting, difficulty with ADLs and IADLs when it is flared up,  getting in and out of truick, cleaning, vacuuming, laying flat. Relevant past medical history and comorbidities include anoxic brain injury, heart failure and acute kidney injury (associated with two cardiac resuscitations after hospitalization for MI in April 2022), anxiety, arthritis, basal cell cancer (removed), cataracts, depression, erectile dysfunction, GERD, hyperlipidemia, hypertension, Kidney stone, neuropathy of legs, psoriasis, vitreous floater, right  and left heart catheterization, (12/2020), nodule of left lung (being monitored), type II DM, plantar fasciitis, CKD, BPH with urinary obstruction, neck pain, history of melanoma, tremor of right hand, bilateral leg edema, HTN.  Patient denies hx of stroke, seizures, unexplained weight loss, unexplained changes in bowel or bladder problems, unexplained stumbling or dropping things, osteoporosis, and spinal surgery   PRECAUTIONS: watch what he does and know the signs he needs to slow down or rest, carry nitroglycerine, jogging.    SUBJECTIVE:                                                                                                                                                                                      SUBJECTIVE STATEMENT:  Patient reports his back has been feeling pretty good. For some reason after last PT session his back bothered him for a couple of days, which is the first time this happened. He has been using voltaren gel over his back after his shower, but he is not sure if it helps better. He was tempted to take a muscle relaxer one of the days his back hurt  more, but he did not take it because the next day he feels out of sorts when he takes it.    PAIN:  Are you having pain? NPRS 1/10 in the low back "what is normal"   OBJECTIVE  TODAY'S TREATMENT:  Therapeutic exercise: to centralize symptoms and improve ROM, strength,  muscular endurance, and activity tolerance required for successful completion of functional activities.  - seated lumbar flexion roll out with theraball, 1x4 min forwards, and alternating diagonals.  - seated hip flexor stretch at chair, 3x30 seconds. Cuing for hold time, form to improve hip flexion. Used Designer, multimedia on floor for back foot.  - standing leg swings, 3x30 seconds each side with B UE support.  Required cuing for time. Improved carry over.  - standing lumbar extension over TM bar, 3x10. Cuing for improved open chest.  - standing overhead sholder mobility AROM max B scaption while holding strap and back to wall, 2x10. Cuing for form.  - reverse lunge with airex pad under knee and ipsilateral UE support on TM bar, 3x10 each side. Cuing for form, chest up, light tap of knee, free hand on hip, sets, and reps.  - standing pec stretch in doorway, 3x30 seconds. Cuing for form, hold time, region he should feel stretch.  - standing scapular retraction in anatomical position at wall, 1x10 with 5 second hold.. Cuing for technique, full ROM.  - Education on HEP including handout   Pt required multimodal cuing for proper technique and to facilitate improved neuromuscular control, strength, range of motion, and functional ability resulting in improved performance and form.   PATIENT EDUCATION:  Education details: Exercise purpose/form. Self management techniques.  Education on HEP including handout  Reviewed cancelation/no-show policy with patient and confirmed patient has correct phone number for clinic; patient verbalized understanding (07/28/22). Person educated: Patient Education method: Explanation, Demonstration, Tactile cues, and Verbal cues Education comprehension: verbalized understanding, returned demonstration, and needs further education   HOME EXERCISE PROGRAM: Access Code: [email or web address] URL: [email or web address] Date: 08/18/2022 Prepared by: Rosita Kea  Exercises - Supine Lower Trunk Rotation  - 2 x daily - 1 sets - 20 reps - Cat Cow  - 2 x daily - 1-2 sets - 20 reps - Child's Pose Stretch  - 2 x daily - 1 sets - 20 reps - Seated Hip Flexor Stretch  - 1 x daily - 3 sets - 30 seconds hold - Leg Swings Side to Side  - 3 x weekly - 3 sets - 20 reps - Standing Lumbar Extension with Counter  - 3 x weekly - 2 sets - 10 reps - Standing Lumbar Extension at Wall - Forearms  - 3 x weekly - 2 sets - 10 reps - 1 second hold - Doorway Pec Stretch at 90 Degrees Abduction  - 3 x weekly - 3 reps - 30 seconds hold - Standing Anatomical Position with Scapular Retraction and Depression at Wall  - 3 x weekly - 2-3 sets - 10 reps - 5 seconds hold - Reverse Lunge  - 3 x weekly - 2-3 sets - 10 reps  HOME EXERCISE PROGRAM [[email or web address]] view at [email or web address]" using code: [email or web address]  Shoulder dowel flexion - Dustin -  Repeat 10 Times, Complete 2 Sets, Perform 1 Times a Day   ASSESSMENT:   CLINICAL IMPRESSION: Patient arrives with report of increased pain after last PT session but wanted to continue with exercises introduced last PT session. He continues to demonstrate improving carry over and independence with HEP. Visit frequency reduced to  1x a week in preparation for discharge. Patient continues to require cuing for improved form, exercise selection, hold time. Patient would benefit from continued management of limiting condition by skilled physical therapist to address remaining impairments and functional limitations to  work towards stated goals and return to PLOF or maximal functional independence.   From initial eval 07/23/2022:  Patient is a 71 y.o. male referred to outpatient physical therapy with a medical diagnosis of chronic left-sided low back pain with left-sided sciatica who presents with signs and symptoms consistent with chronic low back pain with intermittent referral to left groin and thigh above knee. Patient is very stiff in all lumbar  movements and hip extension which likely contribute to his pain.  Patient presents with significant pain, joint stiffness, ROM, posture, balance, muscle performance (strength/power/endurance), and activity tolerance impairments that are limiting ability to complete usual activities such as going sit to stand after sitting, prolonged standing, prolonged sitting, difficulty with ADLs and IADLs when it is flared up, getting in and out of truck, cleaning, vacuuming, laying flat without difficulty. Patient will benefit from skilled physical therapy intervention to address current body structure impairments and activity limitations to improve function and work towards goals set in current POC in order to return to prior level of function or maximal functional improvement.      OBJECTIVE IMPAIRMENTS: decreased activity tolerance, decreased balance, decreased endurance, decreased knowledge of condition, decreased mobility, difficulty walking, decreased ROM, decreased strength, hypomobility, impaired perceived functional ability, increased muscle spasms, impaired flexibility, postural dysfunction, and pain.    ACTIVITY LIMITATIONS: carrying, lifting, bending, sitting, standing, squatting, transfers, bed mobility, and locomotion level   PARTICIPATION LIMITATIONS: cleaning, laundry, community activity, yard work, and   usual activities such as going sit to stand after sitting, prolonged standing, prolonged sitting, difficulty with ADLs and IADLs when it is flared up, getting in and out of truick, cleaning, vacuuming, laying flat   PERSONAL FACTORS: Age, Past/current experiences, Time since onset of injury/illness/exacerbation, and 3+ comorbidities:   anoxic brain injury, heart failure and acute kidney injury (associated with two cardiac resuscitations after hospitalization for MI in April 2022), anxiety, arthritis, basal cell cancer (removed), cataracts, depression, erectile dysfunction, GERD, hyperlipidemia,  hypertension, Kidney stone, neuropathy of legs, psoriasis, vitreous floater, right  and left heart catheterization, (12/2020), nodule of left lung (being monitored), type II DM, plantar fasciitis, CKD, BPH with urinary obstruction, neck pain, history of melanoma, tremor of right hand, bilateral leg edema, HTN are also affecting patient's functional outcome.    REHAB POTENTIAL: Good   CLINICAL DECISION MAKING: Stable/uncomplicated   EVALUATION COMPLEXITY: Low     GOALS: Goals reviewed with patient? No   SHORT TERM GOALS: Target date: 08/06/2022   Patient will be independent with initial home exercise program for self-management of symptoms. Baseline: Initial HEP to be provided at visit 2 as appropriate (07/23/22); Goal status: In-progress     LONG TERM GOALS: Target date: 10/15/2022   Patient will be independent with a long-term home exercise program for self-management of symptoms.  Baseline: Initial HEP to be provided at visit 2 as appropriate (07/23/22); Goal status: In-progress   2.  Patient will demonstrate improved FOTO to equal or greater than 82 by visit #8 to demonstrate improvement in overall condition and self-reported functional ability.  Baseline: 71 (07/23/22); Goal status: In-progress   3.  Patient will ambulate equal or greater than 1729 feet with LRAD to reach aged matched norm to improve community mobility.  Baseline: to be tested visit 2 as appropriate (07/23/22); 1769 feet with no AD, building "pressure"  in low back, relieved by sitting after (07/28/2022);  Goal status: MET   4.  Patient will report pain equal or less than 2/10 during  functional activities to improve his ability to complete activities such as vacuuming, shopping, and getting in and out of the truck.  Baseline: up to 10/10 (07/23/22); Goal status: In-progress   5.  Patient will complete community, work and/or recreational activities without limitation due to current condition.  Baseline:  difficulty with usual activities such as going sit to stand after sitting, prolonged standing, prolonged sitting, difficulty with ADLs and IADLs when it is flared up, getting in and out of truck, cleaning, vacuuming, laying flat (07/23/22); Goal status: In-progress   PLAN:   PT FREQUENCY: 1-2x/week   PT DURATION: 12 weeks   PLANNED INTERVENTIONS: Therapeutic exercises, Therapeutic activity, Neuromuscular re-education, Balance training, Gait training, Patient/Family education, Self Care, Joint mobilization, DME instructions, Dry Needling, Electrical stimulation, Spinal mobilization, Cryotherapy, Moist heat, Manual therapy, and Re-evaluation.   PLAN FOR NEXT SESSION: update HEP as appropriate, manual therapy as needed, stretching, LE, core, and functional strengthening and balance as appropriate.      Nancy Nordmann, PT, DPT 08/27/2022, 10:32 AM  [facility] [address] [place], [place] [address] P: [phone] I F: [phone]

## 2022-08-28 ENCOUNTER — Encounter: Payer: Medicare PPO | Admitting: Physical Therapy

## 2022-08-29 DIAGNOSIS — D49511 Neoplasm of unspecified behavior of right kidney: Secondary | ICD-10-CM | POA: Diagnosis not present

## 2022-08-29 DIAGNOSIS — R3914 Feeling of incomplete bladder emptying: Secondary | ICD-10-CM | POA: Diagnosis not present

## 2022-08-29 DIAGNOSIS — N401 Enlarged prostate with lower urinary tract symptoms: Secondary | ICD-10-CM | POA: Diagnosis not present

## 2022-09-01 ENCOUNTER — Ambulatory Visit: Payer: Medicare PPO | Admitting: Physical Therapy

## 2022-09-02 ENCOUNTER — Encounter: Payer: Medicare PPO | Admitting: Physical Therapy

## 2022-09-03 ENCOUNTER — Ambulatory Visit: Payer: Medicare PPO | Admitting: Physical Therapy

## 2022-09-04 ENCOUNTER — Encounter: Payer: Medicare PPO | Admitting: Physical Therapy

## 2022-09-04 ENCOUNTER — Telehealth: Payer: Self-pay | Admitting: Pharmacist

## 2022-09-04 NOTE — Chronic Care Management (AMB) (Signed)
Chronic Care Management Pharmacy Assistant   Name: [name]  MRN: [id number] DOB: 07/16/1951  Reason for Encounter: Disease State and Medication Review / Hypertension Assessment and Medication Coordination Call.    Recent office visits:  08/18/2022 Dalton Penna MD - Patient was seen for anxiety state and additional concerns. No medication changes. No follow up noted.   Recent consult visits:  None  Hospital visits:  None  Medications: Outpatient Encounter Medications as of 09/04/2022  Medication Sig Note   aspirin 81 MG chewable tablet Chew 1 tablet (81 mg total) by mouth daily.    carvedilol (COREG) 3.125 MG tablet TAKE ONE TABLET BY MOUTH EVERY MORNING and TAKE ONE TABLET BY MOUTH EVERY EVENING    Cholecalciferol (VITAMIN D) 50 MCG (2000 UT) CAPS Take 1 capsule by mouth daily.    diclofenac Sodium (VOLTAREN) 1 % GEL Apply 2 g topically 2 (two) times daily as needed (pain).    DULoxetine (CYMBALTA) 20 MG capsule Take 1 capsule (20 mg total) by mouth daily.    ENTRESTO 24-26 MG TAKE ONE TABLET BY MOUTH twice daily    FARXIGA 10 MG TABS tablet TAKE ONE TABLET BY MOUTH BEFORE BREAKFAST    finasteride ([name]) 5 MG tablet Take 1 tablet (5 mg total) by mouth daily.    ketoconazole (NIZORAL) 2 % cream Apply 1 application topically daily as needed for irritation. 03/20/2022: prn   LORazepam (ATIVAN) 2 MG tablet Take 1 tablet (2 mg total) by mouth every 6 (six) hours as needed for anxiety.    melatonin 3 MG TABS tablet Take 1 tablet by mouth daily.    nitroGLYCERIN (NITROSTAT) 0.4 MG SL tablet Place 1 tablet (0.4 mg total) under the tongue every 5 (five) minutes as needed for chest pain.    omeprazole (PRILOSEC) 20 MG capsule TAKE ONE CAPSULE BY MOUTH daily as needed    rosuvastatin (CRESTOR) 10 MG tablet TAKE ONE TABLET BY MOUTH ONCE DAILY    sildenafil (REVATIO) 20 MG tablet Take 20 mg by mouth as needed.    triamcinolone cream (KENALOG) 0.1 % Apply 1 application topically 2  (two) times daily as needed (psoriasis).    [DISCONTINUED] tadalafil (CIALIS) 20 MG tablet Take 20 mg by mouth daily as needed.      No facility-administered encounter medications on file as of 09/04/2022.   Reviewed chart for medication changes ahead of medication coordination call.  BP Readings from Last 3 Encounters:  08/18/22 118/74  08/11/22 124/69  07/28/22 119/66    Lab Results  Component Value Date   HGBA1C 5.8 06/11/2022   Reviewed chart prior to disease state call. Spoke with patient regarding BP  Recent Office Vitals: BP Readings from Last 3 Encounters:  08/18/22 118/74  08/11/22 124/69  07/28/22 119/66   Pulse Readings from Last 3 Encounters:  08/18/22 72  08/11/22 (!) 58  07/28/22 65    Wt Readings from Last 3 Encounters:  08/18/22 185 lb (83.9 kg)  07/22/22 183 lb (83 kg)  07/07/22 183 lb (83 kg)     Kidney Function Lab Results  Component Value Date/Time   CREATININE 1.69 (H) 06/11/2022 08:48 AM   CREATININE 1.57 (H) 12/31/2021 12:32 PM   CREATININE 1.12 05/24/2020 08:29 AM   GFR 40.27 (L) 06/11/2022 08:48 AM   GFRNONAA 47 (L) 12/31/2021 12:32 PM   GFRAA 81 10/25/2019 02:55 PM       Latest Ref Rng & Units 06/11/2022    8:48 AM 12/31/2021  12:32 PM 08/13/2021    2:54 PM  BMP  Glucose 70 - 99 mg/dL 96  97  101   BUN 6 - 23 mg/dL 43  31  28   Creatinine 0.40 - 1.50 mg/dL 1.69  1.57  1.82   Sodium 135 - 145 mEq/L 136  135  137   Potassium 3.5 - 5.1 mEq/L 4.0  4.2  4.4   Chloride 96 - 112 mEq/L 102  105  104   CO2 19 - 32 mEq/L '26  23  26   '$ Calcium 8.4 - 10.5 mg/dL 9.4  9.0  8.8     Current antihypertensive regimen:  Carvedilol 3.125 mg twice daily Entresto 24/26 mg twice daily  How often are you checking your Blood Pressure? Patient states he is checking blood pressures daily.   Current home BP readings:   09/05/22 - 130/70     09/04/22 - 130/74  09/03/22 - 136/73  09/02/22 - 131/87  09/01/22 - 129/73   What recent interventions/DTPs  have been made by any provider to improve Blood Pressure control since last CPP Visit: No recent interventions.   Any recent hospitalizations or ED visits since last visit with CPP? No recent hospital visits.   What diet changes have been made to improve Blood Pressure Control?  Patient follows a low fat, low sodium diet and he doesn't eat fast food Breakfast - patient will have cereal Lunch - patient will have salads with fruits and nuts Dinner - patient will have a meat, vegetable and fruit  What exercise is being done to improve your Blood Pressure Control?  Patient is walking daily and doing weights, stretches and balance exercises daily.   Adherence Review: Is the patient currently on ACE/ARB medication? No Does the patient have >5 day gap between last estimated fill dates? No  Patient obtains medications through Adherence Packaging  30 Days    Last adherence pickup included:  Farxiga 10 mg: one tablet before breakfast Rosuvastatin 10 mg: one tablet at breakfast Finasteride  5 mg: one tablet before breakfast Carvedilol  3.125 mg: 1 tablet at breakfast and 1 tablet at dinner    Patient declined (meds) last month:  Entresto 24/26 mg - 1 tablet twice daily (90 DS)  Omeprazole 20 mg-1 tablet daily as needed (vial) Brilinta 90 mg: discontinued by cardiologist    Next adherence delivery scheduled on: 09/17/2022   Called patient and reviewed medications and coordinated delivery.   This delivery to include: Farxiga 10 mg: one tablet before breakfast Rosuvastatin 10 mg: one tablet at breakfast Finasteride  5 mg: one tablet before breakfast Carvedilol  3.125 mg: 1 tablet at breakfast and dinner   Patient will need a short fill: Patient declines short fills   Coordinated acute fill: Patient declines acute fills   Patient declined the following medications: plenty on hand Entresto 24/26 mg - 1 tablet twice daily  Omeprazole 20 mg-1 tablet daily as needed (vial)  Patient  changed and confirmed delivery date of 09/18/2022, advised patient that pharmacy will contact them the morning of delivery.  Care Gaps: AWV - completed 12/09/2021 Last BP - 118/74 on 08/18/2022 Last A1C - 5.8 on 06/11/2022 Foot exam - never done Urine ACR - never done Covid - overdue  Star Rating Drugs: Farxiga 10 mg - last filled 08/11/2022 30 DS at [facility] Rosuvastatin 10 mg - last filled 08/11/2022 30 DS at [facility] [phone]

## 2022-09-08 ENCOUNTER — Encounter: Payer: Medicare PPO | Admitting: Physical Therapy

## 2022-09-09 ENCOUNTER — Encounter: Payer: Medicare PPO | Admitting: Physical Therapy

## 2022-09-10 ENCOUNTER — Encounter: Payer: Self-pay | Admitting: Physical Therapy

## 2022-09-10 ENCOUNTER — Other Ambulatory Visit: Payer: Self-pay | Admitting: Family Medicine

## 2022-09-10 ENCOUNTER — Ambulatory Visit: Payer: Medicare PPO | Admitting: Physical Therapy

## 2022-09-10 DIAGNOSIS — I10 Essential (primary) hypertension: Secondary | ICD-10-CM

## 2022-09-10 DIAGNOSIS — M5459 Other low back pain: Secondary | ICD-10-CM

## 2022-09-10 DIAGNOSIS — M6281 Muscle weakness (generalized): Secondary | ICD-10-CM | POA: Diagnosis not present

## 2022-09-10 NOTE — Therapy (Signed)
OUTPATIENT PHYSICAL THERAPY TREATMENT NOTE   Patient Name: [name] MRN: [id number] DOB:December 30, 1950, 71 y.o., male Today's Date: 09/10/2022  PCP: [name], MD REFERRING PROVIDER: [name], MD   END OF SESSION:   PT End of Session - 09/10/22 1522     Visit Number 9    Number of Visits 24    Date for PT Re-Evaluation 10/15/22    Authorization Type [employer]IC[employer] reporting period from 07/23/2022    Authorization Time Period auth 24 visits  11/6 - 1/26  Authorization #[id number]    Authorization - Visit Number 8    Authorization - Number of Visits 24    Progress Note Due on Visit 10    PT Start Time 1520    PT Stop Time 1600    PT Time Calculation (min) 40 min    Activity Tolerance Patient tolerated treatment well    Behavior During Therapy [facility] for tasks assessed/performed             Past Medical History:  Diagnosis Date   Anoxic brain injury ([place])    Anxiety    Arthritis    Cancer ([place]) 2010   basal cell ca, head   Cataract    bilateral   Depression    ED (erectile dysfunction)    GERD (gastroesophageal reflux disease)    Heart failure ([place])    Hyperlipidemia    Hypertension    Kidney stone 07/29/2011   passed    Myocardial infarction ([place])    Neuromuscular disorder ([place])    neuropathy legs   Psoriasis    Vitreous floater    left eye, sees Dr. Dawna Part at [facility]    Past Surgical History:  Procedure Laterality Date   BASAL CELL CARCINOMA EXCISION     removed from scalp   COLONOSCOPY  07/03/2017   per Dr. Havery Moros, sessile serrated polyp, repeat in 5 yrs    [place] CATH AND CORONARY ANGIOGRAPHY N/A 01/03/2021   Procedure: LEFT HEART CATH AND CORONARY ANGIOGRAPHY;  Surgeon: Troy Sine, MD;  Location: [facility];  Service: Cardiovascular;  Laterality: N/A;   RIGHT HEART CATH N/A 01/03/2021   Procedure: RIGHT HEART CATH;  Surgeon: Jolaine Artist, MD;  Location: [facility];   Service: Cardiovascular;  Laterality: N/A;   Patient Active Problem List   Diagnosis Date Noted   CAD, multiple vessel 09/03/2021   ST elevation myocardial infarction (STEMI) ([place]) 09/03/2021   Type 2 diabetes mellitus with diabetic chronic kidney disease ([place]) 05/30/2021   CKD (chronic kidney disease), stage III ([place]) 05/30/2021   Hemoptysis    Acute hypoxemic respiratory failure (HCC)    Acute renal failure (HCC)    Encephalopathy acute    Cardiogenic shock ([place]) 01/03/2021   Atypical chest pain 10/25/2019   Coronary artery calcification seen on CAT scan 06/15/2019   Nodule of lower lobe of left lung 06/15/2019   Tremor of right hand 04/24/2017   Numbness and tingling of both legs 04/24/2017   Bilateral leg edema 06/05/2015   Chronic neck pain 06/05/2015   Plantar fasciitis 02/01/2014   Visual floaters 04/04/2013   Subjective vision disturbance, left 04/04/2013   BPH with urinary obstruction 01/01/2010   MELANOMA, HX OF 06/01/2009   NECK PAIN 09/11/2008   CELLULITIS AND ABSCESS OF LEG EXCEPT FOOT 04/26/2008   ERECTILE DYSFUNCTION 10/12/2007   Hyperlipemia, mixed 04/12/2007   Anxiety state 04/12/2007  Essential hypertension 04/12/2007   GERD 04/12/2007    REFERRING DIAG: chronic left-sided low back pain with left-sided sciatica   THERAPY DIAG:  Other low back pain  Muscle weakness (generalized)  Rationale for Evaluation and Treatment: Rehabilitation  PERTINENT HISTORY: Patient is a 71 y.o. male who presents to outpatient physical therapy with a referral for medical diagnosis chronic left-sided low back pain with left-sided sciatica. This patient's chief complaints consist of intermittent low back pain with occasional pain/paresthesia to left groin and glute leading to the following functional deficits: difficulty with usual activities such as going sit to stand after sitting, prolonged standing, prolonged sitting, difficulty with ADLs and IADLs when it is flared up,  getting in and out of truick, cleaning, vacuuming, laying flat. Relevant past medical history and comorbidities include anoxic brain injury, heart failure and acute kidney injury (associated with two cardiac resuscitations after hospitalization for MI in April 2022), anxiety, arthritis, basal cell cancer (removed), cataracts, depression, erectile dysfunction, GERD, hyperlipidemia, hypertension, Kidney stone, neuropathy of legs, psoriasis, vitreous floater, right  and left heart catheterization, (12/2020), nodule of left lung (being monitored), type II DM, plantar fasciitis, CKD, BPH with urinary obstruction, neck pain, history of melanoma, tremor of right hand, bilateral leg edema, HTN.  Patient denies hx of stroke, seizures, unexplained weight loss, unexplained changes in bowel or bladder problems, unexplained stumbling or dropping things, osteoporosis, and spinal surgery   PRECAUTIONS: watch what he does and know the signs he needs to slow down or rest, carry nitroglycerine, jogging.    SUBJECTIVE:                                                                                                                                                                                      SUBJECTIVE STATEMENT:  Patient reports he missed his last PT session because his back pain flared up. He could feel it some Tuesday night and when he woke up on Wednesday it wanted to catch and made him not feel good. He was okay by noon after using the heating pad. The only thing he could think of that he may have done was too much sitting. He has been using the heating pad a little more since then to help. It has been a while since it has flared up that bad but still better than when it bothered him in June. He has picked just 2-3 exercises to do each day since he had the episode of pain to be cautious. He would like to make up the visit he missed.   PAIN:  Are you having pain? NPRS 0/10   OBJECTIVE  TODAY'S TREATMENT:  Therapeutic exercise: to centralize symptoms and improve ROM, strength, muscular endurance, and activity tolerance required for successful completion of functional activities.  - seated lumbar flexion roll out with theraball, 1x4 min forwards, and alternating diagonals. - seated hip flexor stretch at chair, 3x30 seconds. Cuing for hold time, form to improve hip flexion. Used Designer, multimedia on floor for back foot.  - standing leg swings, 3x30 seconds each side with B UE support.  Required cuing for time and improved ROM.  - standing lumbar extension over TM bar, 3x10. Cuing for improved open chest.  - standing overhead sholder mobility AROM max B scaption while holding strap and back to wall, 3x10. Cuing for form.  - reverse lunge with airex pad under knee and ipsilateral UE support on TM bar, 3x10 each side. Cuing for chest up, light tap of knee, sets, and reps.  - standing pec stretch in doorway, 3x30 seconds. Cuing for form, hold time, region he should feel stretch.   Pt required multimodal cuing for proper technique and to facilitate improved neuromuscular control, strength, range of motion, and functional ability resulting in improved performance and form.   PATIENT EDUCATION:  Education details: Exercise purpose/form. Self management techniques.  Education on HEP including handout  Reviewed cancelation/no-show policy with patient and confirmed patient has correct phone number for clinic; patient verbalized understanding (07/28/22). Person educated: Patient Education method: Explanation, Demonstration, Tactile cues, and Verbal cues Education comprehension: verbalized understanding, returned demonstration, and needs further education   HOME EXERCISE PROGRAM: Access Code: [email or web address] URL: [email or web address] Date: 08/18/2022 Prepared by: Rosita Kea  Exercises - Supine Lower Trunk Rotation  - 2  x daily - 1 sets - 20 reps - Cat Cow  - 2 x daily - 1-2 sets - 20 reps - Child's Pose Stretch  - 2 x daily - 1 sets - 20 reps - Seated Hip Flexor Stretch  - 1 x daily - 3 sets - 30 seconds hold - Leg Swings Side to Side  - 3 x weekly - 3 sets - 20 reps - Standing Lumbar Extension with Counter  - 3 x weekly - 2 sets - 10 reps - Standing Lumbar Extension at Wall - Forearms  - 3 x weekly - 2 sets - 10 reps - 1 second hold - Doorway Pec Stretch at 90 Degrees Abduction  - 3 x weekly - 3 reps - 30 seconds hold - Standing Anatomical Position with Scapular Retraction and Depression at Wall  - 3 x weekly - 2-3 sets - 10 reps - 5 seconds hold - Reverse Lunge  - 3 x weekly - 2-3 sets - 10 reps  HOME EXERCISE PROGRAM [[email or web address]] view at [email or web address]" using code: [email or web address]  Shoulder dowel flexion - Dustin -  Repeat 10 Times, Complete 2 Sets, Perform 1 Times a Day   ASSESSMENT:   CLINICAL IMPRESSION: Patient arrives with report of short episode of low back pain for no apparent reason that improved in a few hours with use of heating pad but caused him to miss his last PT session. He does not feel comfortable discharging today because of that, so  one more visit is planned prior to discharge. Today's session focused on review of HEP and LE/functional/postural strengthening and mobility exercises. Patient tolerated them well with no increase in pain. He showed good carry over from cuing at last session with minimal cuing needed. Patient would benefit from continued management of limiting condition by skilled physical therapist to address remaining impairments and functional limitations  to work towards stated goals and return to PLOF or maximal functional independence.   From initial eval 07/23/2022:  Patient is a 71 y.o. male referred to outpatient physical therapy with a medical diagnosis of chronic left-sided low back pain with left-sided sciatica who presents with signs and symptoms consistent with  chronic low back pain with intermittent referral to left groin and thigh above knee. Patient is very stiff in all lumbar movements and hip extension which likely contribute to his pain.  Patient presents with significant pain, joint stiffness, ROM, posture, balance, muscle performance (strength/power/endurance), and activity tolerance impairments that are limiting ability to complete usual activities such as going sit to stand after sitting, prolonged standing, prolonged sitting, difficulty with ADLs and IADLs when it is flared up, getting in and out of truck, cleaning, vacuuming, laying flat without difficulty. Patient will benefit from skilled physical therapy intervention to address current body structure impairments and activity limitations to improve function and work towards goals set in current POC in order to return to prior level of function or maximal functional improvement.      OBJECTIVE IMPAIRMENTS: decreased activity tolerance, decreased balance, decreased endurance, decreased knowledge of condition, decreased mobility, difficulty walking, decreased ROM, decreased strength, hypomobility, impaired perceived functional ability, increased muscle spasms, impaired flexibility, postural dysfunction, and pain.    ACTIVITY LIMITATIONS: carrying, lifting, bending, sitting, standing, squatting, transfers, bed mobility, and locomotion level   PARTICIPATION LIMITATIONS: cleaning, laundry, community activity, yard work, and   usual activities such as going sit to stand after sitting, prolonged standing, prolonged sitting, difficulty with ADLs and IADLs when it is flared up, getting in and out of truick, cleaning, vacuuming, laying flat   PERSONAL FACTORS: Age, Past/current experiences, Time since onset of injury/illness/exacerbation, and 3+ comorbidities:   anoxic brain injury, heart failure and acute kidney injury (associated with two cardiac resuscitations after hospitalization for MI in April 2022),  anxiety, arthritis, basal cell cancer (removed), cataracts, depression, erectile dysfunction, GERD, hyperlipidemia, hypertension, Kidney stone, neuropathy of legs, psoriasis, vitreous floater, right  and left heart catheterization, (12/2020), nodule of left lung (being monitored), type II DM, plantar fasciitis, CKD, BPH with urinary obstruction, neck pain, history of melanoma, tremor of right hand, bilateral leg edema, HTN are also affecting patient's functional outcome.    REHAB POTENTIAL: Good   CLINICAL DECISION MAKING: Stable/uncomplicated   EVALUATION COMPLEXITY: Low     GOALS: Goals reviewed with patient? No   SHORT TERM GOALS: Target date: 08/06/2022   Patient will be independent with initial home exercise program for self-management of symptoms. Baseline: Initial HEP to be provided at visit 2 as appropriate (07/23/22); Goal status: In-progress     LONG TERM GOALS: Target date: 10/15/2022   Patient will be independent with a long-term home exercise program for self-management of symptoms.  Baseline: Initial HEP to be provided at visit 2 as appropriate (07/23/22); Goal status: In-progress   2.  Patient will demonstrate improved FOTO to equal or greater than 82 by visit #8 to demonstrate improvement in overall condition and self-reported functional ability.  Baseline: 72 (07/23/22); Goal status: In-progress   3.  Patient will ambulate equal or greater than 1729 feet with LRAD to reach aged matched norm to improve community mobility.  Baseline: to be tested visit 2 as appropriate (07/23/22); 1769 feet with no AD, building "pressure"  in low back, relieved by sitting after (07/28/2022);  Goal status: MET   4.  Patient will report pain equal or less than 2/10  during functional activities to improve his ability to complete activities such as vacuuming, shopping, and getting in and out of the truck.  Baseline: up to 10/10 (07/23/22); Goal status: In-progress   5.  Patient will  complete community, work and/or recreational activities without limitation due to current condition.  Baseline: difficulty with usual activities such as going sit to stand after sitting, prolonged standing, prolonged sitting, difficulty with ADLs and IADLs when it is flared up, getting in and out of truck, cleaning, vacuuming, laying flat (07/23/22); Goal status: In-progress   PLAN:   PT FREQUENCY: 1-2x/week   PT DURATION: 12 weeks   PLANNED INTERVENTIONS: Therapeutic exercises, Therapeutic activity, Neuromuscular re-education, Balance training, Gait training, Patient/Family education, Self Care, Joint mobilization, DME instructions, Dry Needling, Electrical stimulation, Spinal mobilization, Cryotherapy, Moist heat, Manual therapy, and Re-evaluation.   PLAN FOR NEXT SESSION: update HEP as appropriate, manual therapy as needed, stretching, LE, core, and functional strengthening and balance as appropriate.      Nancy Nordmann, PT, DPT 09/10/2022, 3:58 PM  [facility] [address] [place], [place] [address] P: [phone] I F: [phone]

## 2022-09-11 ENCOUNTER — Encounter: Payer: Medicare PPO | Admitting: Physical Therapy

## 2022-09-24 ENCOUNTER — Encounter: Payer: Self-pay | Admitting: Physical Therapy

## 2022-09-24 ENCOUNTER — Encounter: Payer: Medicare PPO | Admitting: Physical Therapy

## 2022-09-24 ENCOUNTER — Ambulatory Visit: Payer: Medicare PPO | Attending: Family Medicine | Admitting: Physical Therapy

## 2022-09-24 VITALS — BP 124/72 | HR 69

## 2022-09-24 DIAGNOSIS — M6281 Muscle weakness (generalized): Secondary | ICD-10-CM | POA: Insufficient documentation

## 2022-09-24 DIAGNOSIS — M5459 Other low back pain: Secondary | ICD-10-CM | POA: Diagnosis not present

## 2022-09-24 NOTE — Therapy (Signed)
OUTPATIENT PHYSICAL THERAPY TREATMENT NOTE / DISCHARGE SUMMARY Dates of reporting from 07/23/2022 to 09/24/2022   Patient Name: [name] MRN: [id number] DOB:10-11-1950, 72 y.o., male Today's Date: 09/24/2022  PCP: [name], MD REFERRING PROVIDER: [name], MD   END OF SESSION:   PT End of Session - 09/24/22 1035     Visit Number 10    Number of Visits 24    Date for PT Re-Evaluation 10/15/22    Authorization Type [employer]IC[employer] reporting period from 07/23/2022    Authorization Time Period auth 24 visits  11/6 - 1/26  Authorization #[id number]    Authorization - Visit Number 9    Authorization - Number of Visits 24    Progress Note Due on Visit 10    PT Start Time 1034    PT Stop Time 1120    PT Time Calculation (min) 46 min    Activity Tolerance Patient tolerated treatment well    Behavior During Therapy [facility] for tasks assessed/performed              Past Medical History:  Diagnosis Date   Anoxic brain injury ([place])    Anxiety    Arthritis    Cancer ([place]) 2010   basal cell ca, head   Cataract    bilateral   Depression    ED (erectile dysfunction)    GERD (gastroesophageal reflux disease)    Heart failure ([place])    Hyperlipidemia    Hypertension    Kidney stone 07/29/2011   passed    Myocardial infarction ([place])    Neuromuscular disorder ([place])    neuropathy legs   Psoriasis    Vitreous floater    left eye, sees Dr. Dawna Part at [facility]    Past Surgical History:  Procedure Laterality Date   BASAL CELL CARCINOMA EXCISION     removed from scalp   COLONOSCOPY  07/03/2017   per Dr. Havery Moros, sessile serrated polyp, repeat in 5 yrs    [place] CATH AND CORONARY ANGIOGRAPHY N/A 01/03/2021   Procedure: LEFT HEART CATH AND CORONARY ANGIOGRAPHY;  Surgeon: Troy Sine, MD;  Location: [facility];  Service: Cardiovascular;  Laterality: N/A;   RIGHT HEART CATH N/A 01/03/2021   Procedure: RIGHT HEART CATH;   Surgeon: Jolaine Artist, MD;  Location: [facility];  Service: Cardiovascular;  Laterality: N/A;   Patient Active Problem List   Diagnosis Date Noted   CAD, multiple vessel 09/03/2021   ST elevation myocardial infarction (STEMI) ([place]) 09/03/2021   Type 2 diabetes mellitus with diabetic chronic kidney disease ([place]) 05/30/2021   CKD (chronic kidney disease), stage III ([place]) 05/30/2021   Hemoptysis    Acute hypoxemic respiratory failure (HCC)    Acute renal failure (HCC)    Encephalopathy acute    Cardiogenic shock ([place]) 01/03/2021   Atypical chest pain 10/25/2019   Coronary artery calcification seen on CAT scan 06/15/2019   Nodule of lower lobe of left lung 06/15/2019   Tremor of right hand 04/24/2017   Numbness and tingling of both legs 04/24/2017   Bilateral leg edema 06/05/2015   Chronic neck pain 06/05/2015   Plantar fasciitis 02/01/2014   Visual floaters 04/04/2013   Subjective vision disturbance, left 04/04/2013   BPH with urinary obstruction 01/01/2010   MELANOMA, HX OF 06/01/2009   NECK PAIN 09/11/2008   CELLULITIS AND ABSCESS OF LEG EXCEPT FOOT 04/26/2008   ERECTILE DYSFUNCTION 10/12/2007  Hyperlipemia, mixed 04/12/2007   Anxiety state 04/12/2007   Essential hypertension 04/12/2007   GERD 04/12/2007    REFERRING DIAG: chronic left-sided low back pain with left-sided sciatica   THERAPY DIAG:  Other low back pain  Muscle weakness (generalized)  Rationale for Evaluation and Treatment: Rehabilitation  PERTINENT HISTORY: Patient is a 72 y.o. male who presents to outpatient physical therapy with a referral for medical diagnosis chronic left-sided low back pain with left-sided sciatica. This patient's chief complaints consist of intermittent low back pain with occasional pain/paresthesia to left groin and glute leading to the following functional deficits: difficulty with usual activities such as going sit to stand after sitting, prolonged standing, prolonged  sitting, difficulty with ADLs and IADLs when it is flared up, getting in and out of truick, cleaning, vacuuming, laying flat. Relevant past medical history and comorbidities include anoxic brain injury, heart failure and acute kidney injury (associated with two cardiac resuscitations after hospitalization for MI in April 2022), anxiety, arthritis, basal cell cancer (removed), cataracts, depression, erectile dysfunction, GERD, hyperlipidemia, hypertension, Kidney stone, neuropathy of legs, psoriasis, vitreous floater, right  and left heart catheterization, (12/2020), nodule of left lung (being monitored), type II DM, plantar fasciitis, CKD, BPH with urinary obstruction, neck pain, history of melanoma, tremor of right hand, bilateral leg edema, HTN.  Patient denies hx of stroke, seizures, unexplained weight loss, unexplained changes in bowel or bladder problems, unexplained stumbling or dropping things, osteoporosis, and spinal surgery   PRECAUTIONS: watch what he does and know the signs he needs to slow down or rest, carry nitroglycerine, jogging.    SUBJECTIVE:                                                                                                                                                                                      SUBJECTIVE STATEMENT:  Patient reports he has been feeling well. He uses the heating pad in the morning for about 5 min before he goes for a walk. He states he feels ready to discharge to a long term HEP. He has noticed he does drop light things occasionally. He wanted to let PT know this since he remembers the question about dropping things at his first PT visit. He states he is comfortable with his home exercise program but would like to review cat-cow and have PT do some manual therapy on his back.   PAIN:  Are you having pain? NPRS 0/10   OBJECTIVE  Vitals:   09/24/22 1054  BP: 124/72  Pulse: 69  SpO2: 99%   SELF-REPORTED FUNCTION FOTO score: 82/100 (lumbar  spine questionnaire)  FUNCTIONAL/BALANCE TESTS 6 Minute Walk Test: 1759 feet  with no increased pain.    TODAY'S TREATMENT:                                                                                           Therapeutic exercise: to centralize symptoms and improve ROM, strength, muscular endurance, and activity tolerance required for successful completion of functional activities.  - seated lumbar flexion roll out with theraball, 1x4 min forwards, and alternating diagonals. - standing leg swings, 3x30 seconds each side with B UE support.  Required cuing for time. - standing lumbar extension over TM bar, 1x10.  - standing lumbar extension with hands behind back and with towel roll behind back. ~ 2x10 to practice options for home.  - blood pressure check per patient request (see above).  - 6 Minute Walk Test to assess progress (see above).  - quadruped cat-cow, 2x10 with cuing for improved sequencing. Good carry over after initial cuing.   - education on discharge recommendations.   Manual therapy: to reduce pain and tissue tension, improve range of motion, neuromodulation, in order to promote improved ability to complete functional activities. PRONE - CPA throughout mid to lower thoracic spine and lumbar spine, Grade III-IV, 20-40 reps per segment.  Pt required multimodal cuing for proper technique and to facilitate improved neuromuscular control, strength, range of motion, and functional ability resulting in improved performance and form.   PATIENT EDUCATION:  Education details: Exercise purpose/form. Self management techniques.  Education on HEP including handout  Reviewed cancelation/no-show policy with patient and confirmed patient has correct phone number for clinic; patient verbalized understanding (07/28/22). Person educated: Patient Education method: Explanation, Demonstration, Tactile cues, and Verbal cues Education comprehension: verbalized understanding, returned  demonstration, and needs further education   HOME EXERCISE PROGRAM: Access Code: [email or web address] URL: [email or web address] Date: 08/18/2022 Prepared by: Rosita Kea  Exercises - Supine Lower Trunk Rotation  - 2 x daily - 1 sets - 20 reps - Cat Cow  - 2 x daily - 1-2 sets - 20 reps - Child's Pose Stretch  - 2 x daily - 1 sets - 20 reps - Seated Hip Flexor Stretch  - 1 x daily - 3 sets - 30 seconds hold - Leg Swings Side to Side  - 3 x weekly - 3 sets - 20 reps - Standing Lumbar Extension with Counter  - 3 x weekly - 2 sets - 10 reps - Standing Lumbar Extension at Wall - Forearms  - 3 x weekly - 2 sets - 10 reps - 1 second hold - Doorway Pec Stretch at 90 Degrees Abduction  - 3 x weekly - 3 reps - 30 seconds hold - Standing Anatomical Position with Scapular Retraction and Depression at Wall  - 3 x weekly - 2-3 sets - 10 reps - 5 seconds hold - Reverse Lunge  - 3 x weekly - 2-3 sets - 10 reps  HOME EXERCISE PROGRAM [[email or web address]] view at [email or web address]" using code: [email or web address]  Shoulder dowel flexion - Dustin -  Repeat 10 Times, Complete 2 Sets, Perform 1 Times a Day   ASSESSMENT:   CLINICAL IMPRESSION: Patient has attended 10 physical therapy session since starting current  episode of care on 07/23/2022. He has met all of his goals except one that he has made progress towards. He is participating well in long term HEP and appears ready for discharge to self-management using this program. He is not discharged from PT due to improvement in condition.   From initial eval 07/23/2022:  Patient is a 72 y.o. male referred to outpatient physical therapy with a medical diagnosis of chronic left-sided low back pain with left-sided sciatica who presents with signs and symptoms consistent with chronic low back pain with intermittent referral to left groin and thigh above knee. Patient is very stiff in all lumbar movements and hip extension which likely contribute to his pain.  Patient presents  with significant pain, joint stiffness, ROM, posture, balance, muscle performance (strength/power/endurance), and activity tolerance impairments that are limiting ability to complete usual activities such as going sit to stand after sitting, prolonged standing, prolonged sitting, difficulty with ADLs and IADLs when it is flared up, getting in and out of truck, cleaning, vacuuming, laying flat without difficulty. Patient will benefit from skilled physical therapy intervention to address current body structure impairments and activity limitations to improve function and work towards goals set in current POC in order to return to prior level of function or maximal functional improvement.      OBJECTIVE IMPAIRMENTS: decreased activity tolerance, decreased balance, decreased endurance, decreased knowledge of condition, decreased mobility, difficulty walking, decreased ROM, decreased strength, hypomobility, impaired perceived functional ability, increased muscle spasms, impaired flexibility, postural dysfunction, and pain.    ACTIVITY LIMITATIONS: carrying, lifting, bending, sitting, standing, squatting, transfers, bed mobility, and locomotion level   PARTICIPATION LIMITATIONS: cleaning, laundry, community activity, yard work, and   usual activities such as going sit to stand after sitting, prolonged standing, prolonged sitting, difficulty with ADLs and IADLs when it is flared up, getting in and out of truick, cleaning, vacuuming, laying flat   PERSONAL FACTORS: Age, Past/current experiences, Time since onset of injury/illness/exacerbation, and 3+ comorbidities:   anoxic brain injury, heart failure and acute kidney injury (associated with two cardiac resuscitations after hospitalization for MI in April 2022), anxiety, arthritis, basal cell cancer (removed), cataracts, depression, erectile dysfunction, GERD, hyperlipidemia, hypertension, Kidney stone, neuropathy of legs, psoriasis, vitreous floater, right  and  left heart catheterization, (12/2020), nodule of left lung (being monitored), type II DM, plantar fasciitis, CKD, BPH with urinary obstruction, neck pain, history of melanoma, tremor of right hand, bilateral leg edema, HTN are also affecting patient's functional outcome.    REHAB POTENTIAL: Good   CLINICAL DECISION MAKING: Stable/uncomplicated   EVALUATION COMPLEXITY: Low     GOALS: Goals reviewed with patient? No   SHORT TERM GOALS: Target date: 08/06/2022   Patient will be independent with initial home exercise program for self-management of symptoms. Baseline: Initial HEP to be provided at visit 2 as appropriate (07/23/22); Goal status: Met     LONG TERM GOALS: Target date: 10/15/2022   Patient will be independent with a long-term home exercise program for self-management of symptoms.  Baseline: Initial HEP to be provided at visit 2 as appropriate (07/23/22); participating well (09/24/2022) Goal status: MET   2.  Patient will demonstrate improved FOTO to equal or greater than 82 by visit #8 to demonstrate improvement in overall condition and self-reported functional ability.  Baseline: 72 (07/23/22); 82 at visit #10 (09/24/2022);  Goal status:  MET   3.  Patient will ambulate equal or greater than 1729 feet with LRAD to reach aged matched norm to  improve community mobility.  Baseline: to be tested visit 2 as appropriate (07/23/22); 1769 feet with no AD, building "pressure"  in low back, relieved by sitting after (07/28/2022); 1759 feet with no increase in pain (09/24/2022);  Goal status: MET   4.  Patient will report pain equal or less than 2/10 during functional activities to improve his ability to complete activities such as vacuuming, shopping, and getting in and out of the truck.  Baseline: up to 10/10 (07/23/22); up to 1-2/10 in the last two weeks (09/24/2022);  Goal status: MET   5.  Patient will complete community, work and/or recreational activities without limitation due to  current condition.  Baseline: difficulty with usual activities such as going sit to stand after sitting, prolonged standing, prolonged sitting, difficulty with ADLs and IADLs when it is flared up, getting in and out of truck, cleaning, vacuuming, laying flat (07/23/22); patient estimates 40% improvement since starting PT depending on the activity (09/24/2022);  Goal status: Partially met   PLAN:   PT FREQUENCY: 1-2x/week   PT DURATION: 12 weeks   PLANNED INTERVENTIONS: Therapeutic exercises, Therapeutic activity, Neuromuscular re-education, Balance training, Gait training, Patient/Family education, Self Care, Joint mobilization, DME instructions, Dry Needling, Electrical stimulation, Spinal mobilization, Cryotherapy, Moist heat, Manual therapy, and Re-evaluation.   PLAN FOR NEXT SESSION: Patient is now discharged from PT.    Nancy Nordmann, PT, DPT 09/24/2022, 3:10 PM  [facility] [address] [place], [place] [address] P: [phone] I F: [phone]

## 2022-10-07 ENCOUNTER — Encounter: Payer: Self-pay | Admitting: Gastroenterology

## 2022-10-07 ENCOUNTER — Telehealth: Payer: Self-pay | Admitting: Pharmacist

## 2022-10-07 NOTE — Progress Notes (Signed)
Care Management & Coordination Services Pharmacy Team  Name: [name]  MRN: [id number] DOB: Jun 27, 1951  Reason for Encounter: Medication coordination and delivery  Contacted patient on 10/08/2022 to discuss medications   Recent office visits:  None  Recent consult visits:  None  Hospital visits:  None  Medications: Outpatient Encounter Medications as of 10/07/2022  Medication Sig Note   aspirin 81 MG chewable tablet Chew 1 tablet (81 mg total) by mouth daily.    carvedilol (COREG) 3.125 MG tablet TAKE ONE TABLET BY MOUTH EVERY MORNING and TAKE ONE TABLET BY MOUTH EVERY EVENING    Cholecalciferol (VITAMIN D) 50 MCG (2000 UT) CAPS Take 1 capsule by mouth daily.    diclofenac Sodium (VOLTAREN) 1 % GEL Apply 2 g topically 2 (two) times daily as needed (pain).    DULoxetine (CYMBALTA) 20 MG capsule Take 1 capsule (20 mg total) by mouth daily.    ENTRESTO 24-26 MG TAKE ONE TABLET BY MOUTH twice daily    FARXIGA 10 MG TABS tablet TAKE ONE TABLET BY MOUTH BEFORE BREAKFAST    finasteride ([name]) 5 MG tablet Take 1 tablet (5 mg total) by mouth daily.    ketoconazole (NIZORAL) 2 % cream Apply 1 application topically daily as needed for irritation. 03/20/2022: prn   LORazepam (ATIVAN) 2 MG tablet Take 1 tablet (2 mg total) by mouth every 6 (six) hours as needed for anxiety.    melatonin 3 MG TABS tablet Take 1 tablet by mouth daily.    nitroGLYCERIN (NITROSTAT) 0.4 MG SL tablet Place 1 tablet (0.4 mg total) under the tongue every 5 (five) minutes as needed for chest pain.    omeprazole (PRILOSEC) 20 MG capsule TAKE ONE CAPSULE BY MOUTH daily as needed    rosuvastatin (CRESTOR) 10 MG tablet TAKE ONE TABLET BY MOUTH ONCE DAILY    sildenafil (REVATIO) 20 MG tablet Take 20 mg by mouth as needed.    triamcinolone cream (KENALOG) 0.1 % Apply 1 application topically 2 (two) times daily as needed (psoriasis).    [DISCONTINUED] tadalafil (CIALIS) 20 MG tablet Take 20 mg by mouth daily as  needed.      No facility-administered encounter medications on file as of 10/07/2022.   BP Readings from Last 3 Encounters:  09/24/22 124/72  08/18/22 118/74  08/11/22 124/69    Pulse Readings from Last 3 Encounters:  09/24/22 69  08/18/22 72  08/11/22 (!) 58    Lab Results  Component Value Date/Time   HGBA1C 5.8 06/11/2022 08:48 AM   HGBA1C 5.3 02/21/2022 01:49 PM   HGBA1C 6.0 05/30/2021 08:42 AM   Lab Results  Component Value Date   CREATININE 1.69 (H) 06/11/2022   BUN 43 (H) 06/11/2022   GFR 40.27 (L) 06/11/2022   GFRNONAA 47 (L) 12/31/2021   GFRAA 81 10/25/2019   NA 136 06/11/2022   K 4.0 06/11/2022   CALCIUM 9.4 06/11/2022   CO2 26 06/11/2022    Last adherence delivery date:09/17/2022      Patient is due for next adherence delivery on: 10/16/2022  Spoke with patient on 10/08/2022 reviewed medications and coordinated delivery.  This delivery to include: Adherence Packaging  30 Days  Farxiga 10 mg: one tablet before breakfast Rosuvastatin 10 mg: one tablet at breakfast Finasteride  5 mg: one tablet before breakfast Carvedilol  3.125 mg: 1 tablet at breakfast and dinner Omeprazole 20 mg-1 tablet daily as needed (vial)  Patient declined the following medications this month: Entresto 24/26 mg - 1 tablet  twice daily   Confirmed delivery date of 10/16/2022, advised patient that pharmacy will contact them the morning of delivery.  Any concerns about your medications? Patient denies  Is patient in packaging Yes, no missed doses   Cycle dispensing form sent to [name] for review.  Care Gaps: AWV - completed 12/09/2021 Last BP - 124/72 on 09/24/2022 Last A1C - 5.8 on 06/11/2022 Foot exam - never done Urine ACR - never done Covid - overdue Eye exam - overdue  Star Rating Drugs: Farxiga 10 mg - last filled 09/12/2022 30 DS at [facility] Rosuvastatin 10 mg - last filled 09/12/2022 30 DS at [facility] [phone]

## 2022-10-15 LAB — HM DIABETES EYE EXAM

## 2022-11-04 ENCOUNTER — Telehealth: Payer: Self-pay

## 2022-11-04 NOTE — Progress Notes (Signed)
Care Management & Coordination Services Pharmacy Team  Reason for Encounter: Medication coordination and delivery  Contacted patient to discuss medications and coordinate delivery from [facility] pharmacy. Spoke with patient on 11/04/2022   Cycle dispensing form sent to [facility] for review.   Last adherence delivery date: 10/16/2022      Patient is due for next adherence delivery on: 11/17/2022  This delivery to include: Adherence Packaging  30 Days  Farxiga 10 mg: one tablet before breakfast Rosuvastatin 10 mg: one tablet at breakfast Finasteride  5 mg: one tablet before breakfast Carvedilol  3.125 mg: 1 tablet at breakfast and dinner Entresto 24/26 mg - 1 tablet twice daily (90 DS vial)  Patient declined the following medications this month: Omeprazole 20 mg-1 tablet daily as needed (vial) plenty on hand  Confirmed delivery date of 11/17/2022, advised patient that pharmacy will contact them the morning of delivery.  Any concerns about your medications? Patient denies  How often do you forget or accidentally miss a dose? Patient denies any missed doses  Is patient in packaging Yes  What is the date on your next pill pack? 11/04/2022 (PM doses)  Any concerns or issues with your packaging? Patient denies any issues.   Recent blood pressure readings are as follows: Patient states this mornings reading was 122/68, pulse 62.  Recent blood glucose readings are as follows: Patient does not check glucose.   Chart review: Recent office visits:  None  Recent consult visits:  None  Hospital visits:  None  Medications: Outpatient Encounter Medications as of 11/04/2022  Medication Sig Note   aspirin 81 MG chewable tablet Chew 1 tablet (81 mg total) by mouth daily.    carvedilol (COREG) 3.125 MG tablet TAKE ONE TABLET BY MOUTH EVERY MORNING and TAKE ONE TABLET BY MOUTH EVERY EVENING    Cholecalciferol (VITAMIN D) 50 MCG (2000 UT) CAPS Take 1 capsule by mouth daily.     diclofenac Sodium (VOLTAREN) 1 % GEL Apply 2 g topically 2 (two) times daily as needed (pain).    DULoxetine (CYMBALTA) 20 MG capsule Take 1 capsule (20 mg total) by mouth daily.    ENTRESTO 24-26 MG TAKE ONE TABLET BY MOUTH twice daily    FARXIGA 10 MG TABS tablet TAKE ONE TABLET BY MOUTH BEFORE BREAKFAST    finasteride ([name]) 5 MG tablet Take 1 tablet (5 mg total) by mouth daily.    ketoconazole (NIZORAL) 2 % cream Apply 1 application topically daily as needed for irritation. 03/20/2022: prn   LORazepam (ATIVAN) 2 MG tablet Take 1 tablet (2 mg total) by mouth every 6 (six) hours as needed for anxiety.    melatonin 3 MG TABS tablet Take 1 tablet by mouth daily.    nitroGLYCERIN (NITROSTAT) 0.4 MG SL tablet Place 1 tablet (0.4 mg total) under the tongue every 5 (five) minutes as needed for chest pain.    omeprazole (PRILOSEC) 20 MG capsule TAKE ONE CAPSULE BY MOUTH daily as needed    rosuvastatin (CRESTOR) 10 MG tablet TAKE ONE TABLET BY MOUTH ONCE DAILY    sildenafil (REVATIO) 20 MG tablet Take 20 mg by mouth as needed.    triamcinolone cream (KENALOG) 0.1 % Apply 1 application topically 2 (two) times daily as needed (psoriasis).    [DISCONTINUED] tadalafil (CIALIS) 20 MG tablet Take 20 mg by mouth daily as needed.      No facility-administered encounter medications on file as of 11/04/2022.   BP Readings from Last 3 Encounters:  09/24/22  124/72  08/18/22 118/74  08/11/22 124/69    Pulse Readings from Last 3 Encounters:  09/24/22 69  08/18/22 72  08/11/22 (!) 58    Lab Results  Component Value Date/Time   HGBA1C 5.8 06/11/2022 08:48 AM   HGBA1C 5.3 02/21/2022 01:49 PM   HGBA1C 6.0 05/30/2021 08:42 AM   Lab Results  Component Value Date   CREATININE 1.69 (H) 06/11/2022   BUN 43 (H) 06/11/2022   GFR 40.27 (L) 06/11/2022   GFRNONAA 47 (L) 12/31/2021   GFRAA 81 10/25/2019   NA 136 06/11/2022   K 4.0 06/11/2022   CALCIUM 9.4 06/11/2022   CO2 26 06/11/2022   [place] Pharmacist Assistant [phone]

## 2022-11-05 ENCOUNTER — Telehealth: Payer: Self-pay | Admitting: Family Medicine

## 2022-11-05 NOTE — Telephone Encounter (Signed)
Good morning, Janie...  Pt called requesting a call back   Re: Medication Discussion had yesterday  Pt states he called your direct line and it seems to be out of order.  I confirmed Pt is calling the correct number.  Please call Pt back at your earliest convenience.

## 2022-11-23 NOTE — Progress Notes (Unsigned)
Cardiology Office Note:   Date:  11/26/2022  NAME:  [name]    MRN: [id number] DOB:  Sep 19, 1951   PCP:  [name], MD  Cardiologist:  None  Electrophysiologist:  None   Referring MD: Jolaine Artist, MD   Chief Complaint  Patient presents with   Follow-up   History of Present Illness:   [name] is a 72 y.o. male with a hx of CAD, CHF, HLD, CKD IIIB who presents for follow-up.  He reports for the past 4 to 6 months he has noticed decreased energy.  Occurs with prolonged activity during the day.  He reports he may snore.  He is single and does not know.  He also reports chest pressure.  He reports heaviness in his chest with very exertional activity.  Symptoms resolved with stopping.  He does have a 50% LAD lesion that was residual after his MI.  His kidney function is stable.  LDL cholesterol at goal.  He is going to the [facility].  He is doing a lot of activity.  He reports the fatigue is bothersome.  He is not on any medications that explain this.  No volume overload.  Weights are stable.  His blood pressure seems to run low in the morning.  He reports he has not been checking in the afternoons.  Unclear if he has sleep apnea.  He is not on any other medications that may explain his fatigue.  We discussed further evaluation of the LAD.  Problem List CAD -STEMI/VF Arrest  -PCI RCA -12/2020 2. Systolic HF -EF [id number] [id number] -EF 55-60% 06/2022 3. CKD IIIb 4. HLD -T chol 121, HDL 47, LDL 59, TG 70  Past Medical History: Past Medical History:  Diagnosis Date   Anoxic brain injury ([place])    Anxiety    Arthritis    Cancer ([place]) 2010   basal cell ca, head   Cataract    bilateral   CHF (congestive heart failure) (HCC)    Coronary artery disease    Depression    ED (erectile dysfunction)    GERD (gastroesophageal reflux disease)    Heart failure ([place])    Hyperlipidemia    Hypertension    Kidney stone 07/29/2011   passed    Myocardial infarction [facility])     Neuromuscular disorder ([place])    neuropathy legs   Psoriasis    Vitreous floater    left eye, sees Dr. Dawna Part at [facility]     Past Surgical History: Past Surgical History:  Procedure Laterality Date   BASAL CELL CARCINOMA EXCISION     removed from scalp   COLONOSCOPY  07/03/2017   per Dr. Havery Moros, sessile serrated polyp, repeat in 5 yrs    [place] CATH AND CORONARY ANGIOGRAPHY N/A 01/03/2021   Procedure: LEFT HEART CATH AND CORONARY ANGIOGRAPHY;  Surgeon: Troy Sine, MD;  Location: [facility];  Service: Cardiovascular;  Laterality: N/A;   RIGHT HEART CATH N/A 01/03/2021   Procedure: RIGHT HEART CATH;  Surgeon: Jolaine Artist, MD;  Location: [facility];  Service: Cardiovascular;  Laterality: N/A;    Current Medications: Current Meds  Medication Sig   aspirin 81 MG chewable tablet Chew 1 tablet (81 mg total) by mouth daily.   carvedilol (COREG) 3.125 MG tablet TAKE ONE TABLET BY MOUTH EVERY MORNING and TAKE ONE TABLET BY MOUTH EVERY EVENING   Cholecalciferol (VITAMIN D) 50 MCG (2000  UT) CAPS Take 1 capsule by mouth daily.   diclofenac Sodium (VOLTAREN) 1 % GEL Apply 2 g topically 2 (two) times daily as needed (pain).   DULoxetine (CYMBALTA) 20 MG capsule TAKE ONE CAPSULE BY MOUTH DAILY   ENTRESTO 24-26 MG TAKE ONE TABLET BY MOUTH twice daily   FARXIGA 10 MG TABS tablet TAKE ONE TABLET BY MOUTH BEFORE BREAKFAST   finasteride ([name]) 5 MG tablet Take 1 tablet (5 mg total) by mouth daily.   ketoconazole (NIZORAL) 2 % cream Apply 1 application topically daily as needed for irritation.   LORazepam (ATIVAN) 2 MG tablet Take 1 tablet (2 mg total) by mouth every 6 (six) hours as needed for anxiety.   melatonin 3 MG TABS tablet Take 1 tablet by mouth daily.   nitroGLYCERIN (NITROSTAT) 0.4 MG SL tablet Place 1 tablet (0.4 mg total) under the tongue every 5 (five) minutes as needed for chest pain.   omeprazole (PRILOSEC) 20 MG capsule  TAKE ONE CAPSULE BY MOUTH daily as needed   rosuvastatin (CRESTOR) 10 MG tablet TAKE ONE TABLET BY MOUTH ONCE DAILY   sildenafil (REVATIO) 20 MG tablet Take 20 mg by mouth as needed.   triamcinolone cream (KENALOG) 0.1 % Apply 1 application topically 2 (two) times daily as needed (psoriasis).     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[employer]  Tobacco Use   Smoking status: Never   Smokeless tobacco: Never  Vaping Use   Vaping Use: Never used  Substance and Sexual Activity   Alcohol use: No    Alcohol/week: 0.0 standard drinks of alcohol   Drug use: No   Sexual activity: Not on file  Other Topics Concern   Not on file  Social History Narrative   Not on file   Social Determinants of Health   Financial Resource Strain: Low Risk  (06/04/2022)   Overall Financial Resource Strain (CARDIA)    Difficulty of Paying Living Expenses: Not hard at all  Food Insecurity: No Food Insecurity (12/09/2021)   Hunger Vital Sign    Worried About Running Out of Food in the Last Year: Never true    [place] in the Last Year: Never true  Transportation Needs: No Transportation Needs (12/09/2021)   PRAPARE - Hydrologist (Medical): No    Lack of Transportation (Non-Medical): No  Physical Activity: Sufficiently Active (12/09/2021)   Exercise Vital Sign    Days of Exercise per Week: 7 days    Minutes of Exercise per Session: 90 min  Stress: Stress Concern Present (12/09/2021)   [place]    Feeling of Stress : To some extent  Social Connections: Socially Isolated (12/28/2020)    Social Connection and Isolation Panel [NHANES]    Frequency of Communication with Friends and Family: More than three times a week    Frequency of Social Gatherings with Friends and Family: More than three times a week    Attends Religious Services: Never    Marine scientist or Organizations: No  Attends Archivist Meetings: Never    Marital Status: Never married     Family History: The patient's family history includes Alzheimer's disease in an other family member; Depression in an other family member; Diabetes in an other family member; Hyperlipidemia in his mother and another family member; Hypertension in his mother and another family member. There is no history of Colon cancer, Stomach cancer, Rectal cancer, or Esophageal cancer.  ROS:   All other ROS reviewed and negative. Pertinent positives noted in the HPI.     EKGs/Labs/Other Studies Reviewed:   The following studies were personally reviewed by me today:   TTE 07/07/2022  1. Left ventricular ejection fraction, by estimation, is 55 to 60%. The  left ventricle has normal function. The left ventricle has no regional  wall motion abnormalities. Left ventricular diastolic parameters were  normal.   2. Right ventricular systolic function is normal. The right ventricular  size is mildly enlarged. There is normal pulmonary artery systolic  pressure.   3. Right atrial size was mildly dilated.   4. The mitral valve is normal in structure. No evidence of mitral valve  regurgitation. No evidence of mitral stenosis.   5. The aortic valve is tricuspid. Aortic valve regurgitation is not  visualized. Aortic valve sclerosis is present, with no evidence of aortic  valve stenosis.   6. The inferior vena cava is normal in size with greater than 50%  respiratory variability, suggesting right atrial pressure of 3 mmHg.   Recent Labs: 06/11/2022: ALT 29; BUN 43; Creatinine, Ser 1.69; Hemoglobin 14.3; Platelets 148.0;  Potassium 4.0; Sodium 136; TSH 2.30   Recent Lipid Panel    Component Value Date/Time   CHOL 121 06/11/2022 0848   CHOL 139 11/16/2019 0859   TRIG 70.0 06/11/2022 0848   HDL 47.70 06/11/2022 0848   HDL 60 11/16/2019 0859   CHOLHDL 3 06/11/2022 0848   VLDL 14.0 06/11/2022 0848   LDLCALC 59 06/11/2022 0848   LDLCALC 71 05/24/2020 0829   LDLDIRECT 141.9 04/06/2013 0843    Physical Exam:   VS:  BP 126/70 (BP Location: Left Arm, Patient Position: Sitting, Cuff Size: Normal)   Pulse 65   Ht 6' (1.829 m)   Wt 183 lb (83 kg)   SpO2 97%   BMI 24.82 kg/m    Wt Readings from Last 3 Encounters:  11/26/22 183 lb (83 kg)  08/18/22 185 lb (83.9 kg)  07/22/22 183 lb (83 kg)    General: Well nourished, well developed, in no acute distress Head: Atraumatic, normal size  Eyes: PEERLA, EOMI  Neck: Supple, no JVD Endocrine: No thryomegaly Cardiac: Normal S1, S2; RRR; no murmurs, rubs, or gallops Lungs: Clear to auscultation bilaterally, no wheezing, rhonchi or rales  Abd: Soft, nontender, no hepatomegaly  Ext: No edema, pulses 2+ Musculoskeletal: No deformities, BUE and BLE strength normal and equal Skin: Warm and dry, no rashes   Neuro: Alert and oriented to person, place, time, and situation, CNII-XII grossly intact, no focal deficits  Psych: Normal mood and affect   ASSESSMENT:   [name] is a 72 y.o. male who presents for the following: 1. Coronary artery disease of native artery of native heart with stable angina pectoris ([place])   2. Hyperlipemia, mixed   3. Chronic systolic heart failure ([place])   4. Essential hypertension   5. Other fatigue     PLAN:   1. Coronary artery disease of native artery of native heart with stable angina pectoris (  [place]) 2. Hyperlipemia, mixed -VF arrest in 2022 secondary to inferior STEMI.  Had 50% proximal LAD lesion.  Continues to report exertional symptoms consistent with angina.  Also fatigue.  This could be related.  I would like to pursue a  cardiac PET scan to evaluate myocardial blood flow in the proximal LAD.  Given his prior PCI he is not a good candidate for SPECT imaging.  His EKG also is abnormal and he cannot exercise on a treadmill.  Would recommend cardiac PET scan. -Continue aspirin.  On a statin.  LDL cholesterol 59.  Close enough to goal. -Continue beta-blocker. -Symptoms could also be blood pressure related.  He will start checking this twice daily.  I would also like to check a full panel of labs including CMP, TSH and CBC to make sure nothing is changed.  We also discussed sleep apnea testing.  For now we will start with the above testing and then proceed with this if needed.  3. Chronic systolic heart failure (HCC) -EF has improved.  Ischemic cardiomyopathy.  Currently on carvedilol 3.25 mg twice daily.  Entresto 24-26 mg twice daily, Farxiga 10 mg daily.  No signs of volume overload.  Evaluation with PET scan and full panel of labs as above.  4. Essential hypertension -Well-controlled.  Check BP twice daily.  5. Other fatigue -Now with symptoms of fatigue.  We will evaluate the LAD with cardiac PET.  He reports he may snore.  We will consider sleep study after the above evaluation.   Shared Decision Making/Informed Consent The risks [chest pain, shortness of breath, cardiac arrhythmias, dizziness, blood pressure fluctuations, myocardial infarction, stroke/transient ischemic attack, nausea, vomiting, allergic reaction, radiation exposure, metallic taste sensation and life-threatening complications (estimated to be 1 in 10,000)], benefits (risk stratification, diagnosing coronary artery disease, treatment guidance) and alternatives of a cardiac PET stress test were discussed in detail with [name] and he agrees to proceed.  Disposition: Return in about 3 months (around 02/26/2023).  Medication Adjustments/Labs and Tests Ordered: Current medicines are reviewed at length with the patient today.  Concerns regarding  medicines are outlined above.  Orders Placed This Encounter  Procedures   NM PET CT CARDIAC PERFUSION MULTI W/ABSOLUTE BLOODFLOW   Comprehensive metabolic panel   CBC   TSH   No orders of the defined types were placed in this encounter.   Patient Instructions  Medication Instructions:  The current medical regimen is effective;  continue present plan and medications.  *If you need a refill on your cardiac medications before your next appointment, please call your pharmacy*   Lab Work: CMET, CBC, TSH today   If you have labs (blood work) drawn today and your tests are completely normal, you will receive your results only by: [place] (if you have MyChart) OR A paper copy in the mail If you have any lab test that is abnormal or we need to change your treatment, we will call you to review the results.   Testing/Procedures:  CARDIAC PET- Your physician has requested that you have a Cardiac Pet Stress Test. This testing is completed at [facility] ([place], [place] [address]). The schedulers will call you to get this scheduled. Please follow instructions below and call the office with any questions/concerns [phone]).    Follow-Up: At [facility], you and your health needs are our priority.  As part of our continuing mission to provide you with exceptional heart care, we have created designated  Provider Care Teams.  These Care Teams include your primary Cardiologist (physician) and Advanced Practice Providers (APPs -  Physician Assistants and Nurse Practitioners) who all work together to provide you with the care you need, when you need it.  We recommend signing up for the patient portal called "MyChart".  Sign up information is provided on this After Visit Summary.  MyChart is used to connect with patients for Virtual Visits (Telemedicine).  Patients are able to view lab/test results, encounter notes, upcoming appointments,  etc.  Non-urgent messages can be sent to your provider as well.   To learn more about what you can do with MyChart, go to [email or web address].    Your next appointment:   3 month(s)  Provider:   Eleonore Chiquito, MD  Other Instructions   Take BP at home x2 daily    How to Prepare for Your Cardiac PET/CT Stress Test:  1. Please do not take these medications before your test:   Medications that may interfere with the cardiac pharmacological stress agent (ex. nitrates - including erectile dysfunction medications, isosorbide mononitrate, tamulosin or beta-blockers) the day of the exam. (Erectile dysfunction medication should be held for at least 72 hrs prior to test) Theophylline containing medications for 12 hours. Dipyridamole 48 hours prior to the test. Your remaining medications may be taken with water.  2. Nothing to eat or drink, except water, 3 hours prior to arrival time.   NO caffeine/decaffeinated products, or chocolate 12 hours prior to arrival.  3. NO perfume, cologne or lotion  4. Total time is 1 to 2 hours; you may want to bring reading material for the waiting time.  5. Please report to Radiology at the [facility] Main Entrance 30 minutes early for your test.  [place], [place] [address]  Diabetic Preparation:  Hold oral medications. You may take NPH and Lantus insulin. Do not take Humalog or Humulin R (Regular Insulin) the day of your test. Check blood sugars prior to leaving the house. If able to eat breakfast prior to 3 hour fasting, you may take all medications, including your insulin, Do not worry if you miss your breakfast dose of insulin - start at your next meal.  IF YOU THINK YOU MAY BE PREGNANT, OR ARE NURSING PLEASE INFORM THE TECHNOLOGIST.  In preparation for your appointment, medication and supplies will be purchased.  Appointment availability is limited, so if you need to cancel or reschedule, please call the Radiology  Department at [phone]  24 hours in advance to avoid a cancellation fee of $100.00  What to Expect After you Arrive:  Once you arrive and check in for your appointment, you will be taken to a preparation room within the Radiology Department.  A technologist or Nurse will obtain your medical history, verify that you are correctly prepped for the exam, and explain the procedure.  Afterwards,  an IV will be started in your arm and electrodes will be placed on your skin for EKG monitoring during the stress portion of the exam. Then you will be escorted to the PET/CT scanner.  There, staff will get you positioned on the scanner and obtain a blood pressure and EKG.  During the exam, you will continue to be connected to the EKG and blood pressure machines.  A small, safe amount of a radioactive tracer will be injected in your IV to obtain a series of pictures of your heart along with an injection of a stress agent.  After your Exam:  It is recommended that you eat a meal and drink a caffeinated beverage to counter act any effects of the stress agent.  Drink plenty of fluids for the remainder of the day and urinate frequently for the first couple of hours after the exam.  Your doctor will inform you of your test results within 7-10 business days.  For questions about your test or how to prepare for your test, please call: Marchia Bond, Cardiac Imaging Nurse Navigator  Gordy Clement, Cardiac Imaging Nurse Navigator Office: [phone]     Time Spent with Patient: I have spent a total of 35 minutes with patient reviewing hospital notes, telemetry, EKGs, labs and examining the patient as well as establishing an assessment and plan that was discussed with the patient.  > 50% of time was spent in direct patient care.  Signed, Addison Naegeli. Audie Box, MD, [place]  [address], [place], [place] [address] [phone]  11/26/2022 1:57 PM

## 2022-11-25 ENCOUNTER — Other Ambulatory Visit: Payer: Self-pay | Admitting: Family Medicine

## 2022-11-26 ENCOUNTER — Ambulatory Visit: Payer: Medicare PPO | Attending: Cardiovascular Disease | Admitting: Cardiovascular Disease

## 2022-11-26 ENCOUNTER — Encounter: Payer: Self-pay | Admitting: Cardiovascular Disease

## 2022-11-26 VITALS — BP 126/70 | HR 65 | Ht 72.0 in | Wt 183.0 lb

## 2022-11-26 DIAGNOSIS — I10 Essential (primary) hypertension: Secondary | ICD-10-CM | POA: Diagnosis not present

## 2022-11-26 DIAGNOSIS — I25118 Atherosclerotic heart disease of native coronary artery with other forms of angina pectoris: Secondary | ICD-10-CM | POA: Diagnosis not present

## 2022-11-26 DIAGNOSIS — I5022 Chronic systolic (congestive) heart failure: Secondary | ICD-10-CM

## 2022-11-26 DIAGNOSIS — I2510 Atherosclerotic heart disease of native coronary artery without angina pectoris: Secondary | ICD-10-CM

## 2022-11-26 DIAGNOSIS — R5383 Other fatigue: Secondary | ICD-10-CM | POA: Diagnosis not present

## 2022-11-26 DIAGNOSIS — E782 Mixed hyperlipidemia: Secondary | ICD-10-CM | POA: Diagnosis not present

## 2022-11-26 NOTE — Addendum Note (Signed)
Addended by: Geralynn Rile on: 11/26/2022 01:58 PM   Modules accepted: Orders

## 2022-11-26 NOTE — Patient Instructions (Addendum)
Medication Instructions:  The current medical regimen is effective;  continue present plan and medications.  *If you need a refill on your cardiac medications before your next appointment, please call your pharmacy*   Lab Work: CMET, CBC, TSH today   If you have labs (blood work) drawn today and your tests are completely normal, you will receive your results only by: [place] (if you have MyChart) OR A paper copy in the mail If you have any lab test that is abnormal or we need to change your treatment, we will call you to review the results.   Testing/Procedures:  CARDIAC PET- Your physician has requested that you have a Cardiac Pet Stress Test. This testing is completed at [facility] ([place],  [place] [address]). The schedulers will call you to get this scheduled. Please follow instructions below and call the office with any questions/concerns [phone]).    Follow-Up: At [facility], you and your health needs are our priority.  As part of our continuing mission to provide you with exceptional heart care, we have created designated Provider Care Teams.  These Care Teams include your primary Cardiologist (physician) and Advanced Practice Providers (APPs -  Physician Assistants and Nurse Practitioners) who all work together to provide you with the care you need, when you need it.  We recommend signing up for the patient portal called "MyChart".  Sign up information is provided on this After Visit Summary.  MyChart is used to connect with patients for Virtual Visits (Telemedicine).  Patients are able to view lab/test results, encounter notes, upcoming appointments, etc.  Non-urgent messages can be sent to your provider as well.   To learn more about what you can do with MyChart, go to [email or web address].    Your next appointment:   3 month(s)  Provider:   Eleonore Chiquito, MD  Other Instructions   Take BP at home x2 daily     How to Prepare for Your Cardiac PET/CT Stress Test:  1. Please do not take these medications before your test:   Medications that may interfere with the cardiac pharmacological stress agent (ex. nitrates - including erectile dysfunction medications, isosorbide mononitrate, tamulosin or beta-blockers) the day of the exam. (Erectile dysfunction medication should be held for at least 72 hrs prior to test) Theophylline containing medications for 12 hours. Dipyridamole 48 hours prior to the test. Your remaining medications may be taken with water.  2. Nothing to eat or drink, except water, 3 hours prior to arrival time.   NO caffeine/decaffeinated products, or chocolate 12 hours prior to arrival.  3. NO perfume, cologne or lotion  4. Total time is 1 to 2 hours; you may want to bring reading material for the waiting time.  5. Please report to Radiology at the [facility] Main Entrance 30 minutes early for your test.  [place], [place] [address]  Diabetic Preparation:  Hold oral medications. You may take NPH and Lantus insulin. Do not take Humalog or Humulin R (Regular Insulin) the day of your test. Check blood sugars prior to leaving the house. If able to eat breakfast prior to 3 hour fasting, you may take all medications, including your insulin, Do not worry if you miss your breakfast dose of insulin - start at your next meal.  IF YOU THINK YOU MAY BE PREGNANT, OR ARE NURSING PLEASE INFORM THE TECHNOLOGIST.  In preparation for your appointment, medication and supplies will be purchased.  Appointment availability is limited, so if you need to cancel or reschedule, please call the Radiology Department at [phone]  24 hours in advance to avoid a cancellation fee of $100.00  What to Expect After you Arrive:  Once you arrive and check in for your appointment, you will be taken to a preparation room within the Radiology Department.  A technologist or Nurse  will obtain your medical history, verify that you are correctly prepped for the exam, and explain the procedure.  Afterwards,  an IV will be started in your arm and electrodes will be placed on your skin for EKG monitoring during the stress portion of the exam. Then you will be escorted to the PET/CT scanner.  There, staff will get you positioned on the scanner and obtain a blood pressure and EKG.  During the exam, you will continue to be connected to the EKG and blood pressure machines.  A small, safe amount of a radioactive tracer will be injected in your IV to obtain a series of pictures of your heart along with an injection of a stress agent.    After your Exam:  It is recommended that you eat a meal and drink a caffeinated beverage to counter act any effects of the stress agent.  Drink plenty of fluids for the remainder of the day and urinate frequently for the first couple of hours after the exam.  Your doctor will inform you of your test results within 7-10 business days.  For questions about your test or how to prepare for your test, please call: Marchia Bond, Cardiac Imaging Nurse Navigator  Gordy Clement, Cardiac Imaging Nurse Navigator Office: [phone]

## 2022-11-27 LAB — CBC
Hematocrit: 45.8 % (ref 37.5–51.0)
Hemoglobin: 15.7 g/dL (ref 13.0–17.7)
MCH: 31.6 pg (ref 26.6–33.0)
MCHC: 34.3 g/dL (ref 31.5–35.7)
MCV: 92 fL (ref 79–97)
Platelets: 162 10*3/uL (ref 150–450)
RBC: 4.97 x10E6/uL (ref 4.14–5.80)
RDW: 12.7 % (ref 11.6–15.4)
WBC: 6.2 10*3/uL (ref 3.4–10.8)

## 2022-11-27 LAB — COMPREHENSIVE METABOLIC PANEL
ALT: 24 IU/L (ref 0–44)
AST: 24 IU/L (ref 0–40)
Albumin/Globulin Ratio: 2.2 (ref 1.2–2.2)
Albumin: 4.7 g/dL (ref 3.8–4.8)
Alkaline Phosphatase: 40 IU/L — ABNORMAL LOW (ref 44–121)
BUN/Creatinine Ratio: 22 (ref 10–24)
BUN: 34 mg/dL — ABNORMAL HIGH (ref 8–27)
Bilirubin Total: 0.5 mg/dL (ref 0.0–1.2)
CO2: 21 mmol/L (ref 20–29)
Calcium: 9.4 mg/dL (ref 8.6–10.2)
Chloride: 101 mmol/L (ref 96–106)
Creatinine, Ser: 1.53 mg/dL — ABNORMAL HIGH (ref 0.76–1.27)
Globulin, Total: 2.1 g/dL (ref 1.5–4.5)
Glucose: 90 mg/dL (ref 70–99)
Potassium: 4.7 mmol/L (ref 3.5–5.2)
Sodium: 138 mmol/L (ref 134–144)
Total Protein: 6.8 g/dL (ref 6.0–8.5)
eGFR: 48 mL/min/{1.73_m2} — ABNORMAL LOW (ref >59)

## 2022-11-27 LAB — TSH: TSH: 1.39 u[IU]/mL (ref 0.450–4.500)

## 2022-12-01 DIAGNOSIS — N281 Cyst of kidney, acquired: Secondary | ICD-10-CM | POA: Diagnosis not present

## 2022-12-01 DIAGNOSIS — D49511 Neoplasm of unspecified behavior of right kidney: Secondary | ICD-10-CM | POA: Diagnosis not present

## 2022-12-01 DIAGNOSIS — K8020 Calculus of gallbladder without cholecystitis without obstruction: Secondary | ICD-10-CM | POA: Diagnosis not present

## 2022-12-01 DIAGNOSIS — C641 Malignant neoplasm of right kidney, except renal pelvis: Secondary | ICD-10-CM | POA: Diagnosis not present

## 2022-12-01 DIAGNOSIS — K429 Umbilical hernia without obstruction or gangrene: Secondary | ICD-10-CM | POA: Diagnosis not present

## 2022-12-03 ENCOUNTER — Other Ambulatory Visit: Payer: Self-pay | Admitting: Family Medicine

## 2022-12-03 DIAGNOSIS — I10 Essential (primary) hypertension: Secondary | ICD-10-CM

## 2022-12-04 ENCOUNTER — Telehealth: Payer: Self-pay | Admitting: Family Medicine

## 2022-12-04 ENCOUNTER — Telehealth: Payer: Self-pay

## 2022-12-04 NOTE — Telephone Encounter (Signed)
Contacted [name] to schedule their annual wellness visit. Appointment made for 12/15/22.  [name] AWV direct phone # [phone]

## 2022-12-04 NOTE — Progress Notes (Signed)
Patient ID: [name], male   DOB: [birth year], 72 y.o.   MRN: [id number]  Care Management & Coordination Services Pharmacy Team  Reason for Encounter: Medication coordination and delivery  Contacted patient to discuss medications and coordinate delivery from [facility]. Spoke with patient on 12/04/2022  Cycle dispensing form sent to [facility] for review.   Last adherence delivery date:11/17/22      Patient is due for next adherence delivery on: 12/17/22  This delivery to include: Adherence Packaging  30 Days  Farxiga 10 mg: one tablet before breakfast Rosuvastatin 10 mg: one tablet at breakfast Finasteride  5 mg: one tablet before breakfast Carvedilol  3.125 mg: 1 tablet at breakfast and dinner   DULOXETINE HCL DR 20 MG CAP 11/26/2022 90   Entresto 24 mg-26 mg tablet 11/12/2022 90  Patient aware [name] will not be included due to last fill date he also expressed he is still not need omeprazole at this time. Patient desired to change delivery date to 12/17/22 after 3, updated form  Patient declined the following medications this month: None   Any concerns about your medications? No  How often do you forget or accidentally miss a dose? Never  Do you use a pillbox? No  Is patient in packaging Yes  End Date on current pack 12/18/22   Chart review: Recent office visits:  None  Recent consult visits:  11/26/22 Dalton Rile, MD (Cardiology) - Patient presented for Coronary Artery disease of native artery of native heart with stable angina pectoris and other concerns. No medication changes.  Hospital visits:  None in previous 6 months  Medications: Outpatient Encounter Medications as of 12/04/2022  Medication Sig Note   aspirin 81 MG chewable tablet Chew 1 tablet (81 mg total) by mouth daily.    carvedilol (COREG) 3.125 MG tablet TAKE ONE TABLET BY MOUTH EVERY MORNING and TAKE ONE TABLET BY MOUTH EVERY EVENING    Cholecalciferol (VITAMIN D) 50 MCG (2000  UT) CAPS Take 1 capsule by mouth daily.    diclofenac Sodium (VOLTAREN) 1 % GEL Apply 2 g topically 2 (two) times daily as needed (pain).    DULoxetine (CYMBALTA) 20 MG capsule TAKE ONE CAPSULE BY MOUTH DAILY    ENTRESTO 24-26 MG TAKE ONE TABLET BY MOUTH twice daily    FARXIGA 10 MG TABS tablet TAKE ONE TABLET BY MOUTH BEFORE BREAKFAST    finasteride ([name]) 5 MG tablet Take 1 tablet (5 mg total) by mouth daily.    ketoconazole (NIZORAL) 2 % cream Apply 1 application topically daily as needed for irritation. 03/20/2022: prn   LORazepam (ATIVAN) 2 MG tablet Take 1 tablet (2 mg total) by mouth every 6 (six) hours as needed for anxiety.    melatonin 3 MG TABS tablet Take 1 tablet by mouth daily.    nitroGLYCERIN (NITROSTAT) 0.4 MG SL tablet Place 1 tablet (0.4 mg total) under the tongue every 5 (five) minutes as needed for chest pain.    omeprazole (PRILOSEC) 20 MG capsule TAKE ONE CAPSULE BY MOUTH daily as needed    rosuvastatin (CRESTOR) 10 MG tablet TAKE ONE TABLET BY MOUTH ONCE DAILY    sildenafil (REVATIO) 20 MG tablet Take 20 mg by mouth as needed.    triamcinolone cream (KENALOG) 0.1 % Apply 1 application topically 2 (two) times daily as needed (psoriasis).    [DISCONTINUED] tadalafil (CIALIS) 20 MG tablet Take 20 mg by mouth daily as needed.      No facility-administered encounter medications  on file as of 12/04/2022.   BP Readings from Last 3 Encounters:  11/26/22 126/70  09/24/22 124/72  08/18/22 118/74    Pulse Readings from Last 3 Encounters:  11/26/22 65  09/24/22 69  08/18/22 72    Lab Results  Component Value Date/Time   HGBA1C 5.8 06/11/2022 08:48 AM   HGBA1C 5.3 02/21/2022 01:49 PM   HGBA1C 6.0 05/30/2021 08:42 AM   Lab Results  Component Value Date   CREATININE 1.53 (H) 11/26/2022   BUN 34 (H) 11/26/2022   GFR 40.27 (L) 06/11/2022   GFRNONAA 47 (L) 12/31/2021   GFRAA 81 10/25/2019   NA 138 11/26/2022   K 4.7 11/26/2022   CALCIUM 9.4 11/26/2022   CO2 21  11/26/2022       [place] Clinical Pharmacist Assistant [phone]

## 2022-12-10 DIAGNOSIS — N401 Enlarged prostate with lower urinary tract symptoms: Secondary | ICD-10-CM | POA: Diagnosis not present

## 2022-12-10 DIAGNOSIS — D49511 Neoplasm of unspecified behavior of right kidney: Secondary | ICD-10-CM | POA: Diagnosis not present

## 2022-12-10 DIAGNOSIS — R3912 Poor urinary stream: Secondary | ICD-10-CM | POA: Diagnosis not present

## 2022-12-10 DIAGNOSIS — N5201 Erectile dysfunction due to arterial insufficiency: Secondary | ICD-10-CM | POA: Diagnosis not present

## 2022-12-15 ENCOUNTER — Ambulatory Visit (INDEPENDENT_AMBULATORY_CARE_PROVIDER_SITE_OTHER): Payer: Medicare PPO

## 2022-12-15 VITALS — Ht 72.0 in | Wt 180.6 lb

## 2022-12-15 DIAGNOSIS — Z0000 Encounter for general adult medical examination without abnormal findings: Secondary | ICD-10-CM | POA: Diagnosis not present

## 2022-12-15 NOTE — Progress Notes (Signed)
Subjective:   [name] is a 72 y.o. male who presents for Medicare Annual/Subsequent preventive examination.  Review of Systems    Virtual Visit via Telephone Note  I connected with  [name] on 12/15/22 at 10:45 AM EDT by telephone and verified that I am speaking with the correct person using two identifiers.  Location: Patient: Home Provider: Office Persons participating in the virtual visit: patient/Nurse Health Advisor   I discussed the limitations, risks, security and privacy concerns of performing an evaluation and management service by telephone and the availability of in person appointments. The patient expressed understanding and agreed to proceed.  Interactive audio and video telecommunications were attempted between this nurse and patient, however failed, due to patient having technical difficulties OR patient did not have access to video capability.  We continued and completed visit with audio only.  Some vital signs may be absent or patient reported.   Criselda Peaches, LPN  Cardiac Risk Factors include: advanced age (>51men, >73 women);diabetes mellitus;male gender;hypertension     Objective:    Today's Vitals   12/15/22 1047  Weight: 180 lb 9.6 oz (81.9 kg)  Height: 6' (1.829 m)   Body mass index is 24.49 kg/m.     12/15/2022   10:57 AM 07/28/2022   10:18 AM 12/09/2021   11:27 AM 06/09/2021   12:25 PM 02/26/2021   11:24 AM 01/05/2021   10:00 AM 12/28/2020   11:32 AM  Advanced Directives  Does Patient Have a Medical Advance Directive? Yes Yes Yes No Yes Yes Yes  Type of Paramedic of [place];Living will  Out of facility DNR (pink MOST or yellow form);[place];Living will  [place];Living will Living will   Does patient want to make changes to medical advance directive? No - Patient declined Yes (ED - Information included in AVS)   No - Patient declined No - Patient declined No -  Patient declined  Copy of [place] in Chart? Yes - validated most recent copy scanned in chart (See row information)  Yes - validated most recent copy scanned in chart (See row information)  Yes - validated most recent copy scanned in chart (See row information)      Current Medications (verified) Outpatient Encounter Medications as of 12/15/2022  Medication Sig   aspirin 81 MG chewable tablet Chew 1 tablet (81 mg total) by mouth daily.   carvedilol (COREG) 3.125 MG tablet TAKE ONE TABLET BY MOUTH EVERY MORNING and TAKE ONE TABLET BY MOUTH EVERY EVENING   Cholecalciferol (VITAMIN D) 50 MCG (2000 UT) CAPS Take 1 capsule by mouth daily.   diclofenac Sodium (VOLTAREN) 1 % GEL Apply 2 g topically 2 (two) times daily as needed (pain).   DULoxetine (CYMBALTA) 20 MG capsule TAKE ONE CAPSULE BY MOUTH DAILY   ENTRESTO 24-26 MG TAKE ONE TABLET BY MOUTH twice daily   FARXIGA 10 MG TABS tablet TAKE ONE TABLET BY MOUTH BEFORE BREAKFAST   finasteride ([name]) 5 MG tablet Take 1 tablet (5 mg total) by mouth daily.   ketoconazole (NIZORAL) 2 % cream Apply 1 application topically daily as needed for irritation.   LORazepam (ATIVAN) 2 MG tablet Take 1 tablet (2 mg total) by mouth every 6 (six) hours as needed for anxiety.   melatonin 3 MG TABS tablet Take 1 tablet by mouth daily.   nitroGLYCERIN (NITROSTAT) 0.4 MG SL tablet Place 1 tablet (0.4 mg total) under the tongue every 5 (  five) minutes as needed for chest pain.   omeprazole (PRILOSEC) 20 MG capsule TAKE ONE CAPSULE BY MOUTH daily as needed   rosuvastatin (CRESTOR) 10 MG tablet TAKE ONE TABLET BY MOUTH ONCE DAILY   sildenafil (REVATIO) 20 MG tablet Take 20 mg by mouth as needed.   triamcinolone cream (KENALOG) 0.1 % Apply 1 application topically 2 (two) times daily as needed (psoriasis).   [DISCONTINUED] tadalafil (CIALIS) 20 MG tablet Take 20 mg by mouth daily as needed.     No facility-administered encounter medications on file as  of 12/15/2022.    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 Dawna [facility] at [facility]    Past Surgical History:  Procedure Laterality Date   BASAL CELL CARCINOMA EXCISION     removed from scalp   COLONOSCOPY  07/03/2017   per Dr. Havery Moros, sessile serrated polyp, repeat in 5 yrs    [place] CATH AND CORONARY ANGIOGRAPHY N/A 01/03/2021   Procedure: LEFT HEART CATH AND CORONARY ANGIOGRAPHY;  Surgeon: Troy Sine, MD;  Location: [facility];  Service: Cardiovascular;  Laterality: N/A;   RIGHT HEART CATH N/A 01/03/2021   Procedure: RIGHT HEART CATH;  Surgeon: Jolaine Artist, MD;  Location: [facility];  Service: Cardiovascular;  Laterality: N/A;   Family History  Problem Relation Age of Onset   Hypertension Mother    Hyperlipidemia Mother    Depression Other    Diabetes Other    Hyperlipidemia Other    Hypertension Other    Alzheimer's disease Other    Colon cancer Neg Hx    Stomach cancer Neg Hx    Rectal cancer Neg Hx    Esophageal cancer Neg Hx    Social History   Socioeconomic History   Marital status: Single    Spouse name: Not on file   Number of children: Not on file   Years of education: Not on file   Highest education level: Not on file  Occupational History   Occupation: retired    Comment: Pharmacist, hospital   Occupation: Production designer, theatre/television/film - [employer]   Tobacco Use   Smoking status: Never   Smokeless tobacco: Never  Vaping Use   Vaping Use: Never used  Substance and Sexual Activity   Alcohol use: No    Alcohol/week: 0.0 standard drinks of alcohol   Drug use: No   Sexual activity: Not on file  Other Topics Concern   Not on file  Social History Narrative   Not on file   Social Determinants of Health   Financial Resource Strain: [place]  (12/15/2022)   Overall Financial Resource Strain (CARDIA)    Difficulty of Paying Living Expenses: Not hard at all  Food Insecurity: No Food Insecurity (12/15/2022)   Hunger Vital Sign    Worried About Running Out of Food in the Last Year: Never true    Ran Out of Food in the Last Year: Never true  Transportation Needs: No Transportation Needs (12/15/2022)   PRAPARE - Hydrologist (Medical): No    Lack of Transportation (Non-Medical): No  Physical Activity: Sufficiently Active (12/15/2022)   Exercise Vital Sign    Days of Exercise per Week: 7 days    Minutes of Exercise per Session: 30 min  Stress: No Stress Concern Present (12/15/2022)   [place]    Feeling of Stress : Not at all  Social Connections: Moderately Isolated (12/15/2022)   Social Connection and Isolation Panel [NHANES]    Frequency of Communication with Friends and Family: More than three times a week    Frequency of Social Gatherings with Friends and Family: More than three times a week    Attends Religious Services: Never    Marine scientist or Organizations: Yes    Attends Music therapist: More than 4 times per year    Marital Status: Never married    Tobacco Counseling Counseling given: Not Answered   Clinical Intake:  Pre-visit preparation completed: No  Pain : No/denies pain   Nutrition Risk Assessment:  Has the patient had any N/V/D within the last 2 months?  No  Does the patient have any  non-healing wounds?  No  Has the patient had any unintentional weight loss or weight gain?  No   Diabetes:  Is the patient diabetic?  Yes  If diabetic, was a CBG obtained today?  No  Did the patient bring in their glucometer from home?  No      Diabetic?  Yes  Interpreter Needed?: No  Information entered by :: Rolene Arbour LPN   Activities of Daily Living    12/15/2022   10:55 AM  In your present state of health, do you have any difficulty performing the following activities:  Hearing? 0  Vision? 0  Difficulty concentrating or making decisions? 0  Walking or climbing stairs? 0  Dressing or bathing? 0  Doing errands, shopping? 0  Preparing Food and eating ? N  Using the Toilet? N  In the past six months, have you accidently leaked urine? N  Do you have problems with loss of bowel control? N  Managing your Medications? N  Managing your Finances? N  Housekeeping or managing your Housekeeping? N    Patient Care Team: [name], MD as PCP - General [facility], Eustace Quail, DO as Consulting Physician (Neurology) Viona Gilmore, [facility] (Inactive) as Pharmacist (Pharmacist)  Indicate any recent Medical Services you may have received from other than [facility] providers in the past year (date may be approximate).     Assessment:   This is a routine wellness examination for Aly[place].  Hearing/Vision screen Hearing Screening - Comments:: Denies hearing difficulties   Vision Screening - Comments:: Wears rx glasses - up to date with routine eye exams with  [place] issues and exercise activities discussed: Exercise limited by: None identified   Goals Addressed               This Visit's Progress     Stay Active (pt-stated)         Depression Screen    12/15/2022   10:54 AM 08/18/2022  1:49 PM 06/11/2022    8:51 AM 05/15/2022   11:40 AM 02/21/2022    1:50 PM 12/20/2021    1:14 PM 12/09/2021   11:29 AM  PHQ 2/9 Scores  PHQ - 2 Score 0 0 0 0 0 0 0   PHQ- 9 Score  2 0 1 0 4     Fall Risk    12/15/2022   10:55 AM 08/18/2022    1:49 PM 06/11/2022    8:51 AM 05/15/2022   11:40 AM 02/21/2022    1:50 PM  Fall Risk   Falls in the past year? 0 0 0 0 0  Number falls in past yr: 0 0 0 0 0  Injury with Fall? 0 0 0 0 0  Risk for fall due to : No Fall Risks No Fall Risks No Fall Risks No Fall Risks No Fall Risks  Follow up Falls prevention discussed Falls evaluation completed Falls evaluation completed Falls evaluation completed Falls evaluation completed    [place]:  Any stairs in or around the home? Yes  If so, are there any without handrails? No Home free of loose throw rugs in walkways, pet beds, electrical cords, etc? Yes  Adequate lighting in your home to reduce risk of falls? Yes   ASSISTIVE DEVICES UTILIZED TO PREVENT FALLS:  Life alert? No  Use of a cane, walker or w/c? No  Grab bars in the bathroom? Yes  Shower chair or bench in shower? No  Elevated toilet seat or a handicapped toilet? No   TIMED UP AND GO:  Was the test performed? No . Audio visit   Cognitive Function:    03/07/2016    5:14 PM  MMSE - Mini Mental State Exam  Orientation to time 5  Orientation to Place 5  Registration 3  Attention/ Calculation 5  Recall 3  Language- name 2 objects 2  Language- repeat 1  Language- follow 3 step command 3  Language- read & follow direction 1  Write a sentence 1  Copy design 1  Total score 30        12/15/2022   10:57 AM 12/09/2021   11:31 AM  6CIT Screen  What Year? 0 points 0 points  What month? 0 points 0 points  What time? 0 points 0 points  Count back from 20 0 points 0 points  Months in reverse 0 points 0 points  Repeat phrase 0 points 4 points  Total Score 0 points 4 points    Immunizations Immunization History  Administered Date(s) Administered   Fluad Quad(high Dose 65+) 06/15/2019, 05/30/2021, 06/11/2022   Influenza Split 08/13/2011, 06/08/2012,  06/22/2013   Influenza Whole 06/22/2005, 06/19/2010   Influenza, High Dose Seasonal PF 07/05/2017, 05/28/2020   Influenza-Unspecified 07/14/2015, 07/12/2016, 07/05/2017   PFIZER Comirnaty(Gray Top)Covid-19 Tri-Sucrose Vaccine 05/10/2021   PFIZER(Purple Top)SARS-COV-2 Vaccination 10/31/2019, 11/26/2019, 07/07/2020, 06/21/2022   Pfizer Covid-19 Vaccine Bivalent Booster 4yrs & up 08/08/2021   Pneumococcal Conjugate-13 08/11/2013   Pneumococcal Polysaccharide-23 04/23/2016   Respiratory Syncytial Virus Vaccine,Recomb Aduvanted(Arexvy) 07/24/2022   Td 06/19/2010   Tdap 09/12/2013   Zoster Recombinat (Shingrix) 12/20/2021, 04/11/2022   Zoster, Live 08/11/2013    TDAP status: Up to date  Flu Vaccine status: Up to date  Pneumococcal vaccine status: Up to date  Covid-19 vaccine status: Completed vaccines  Qualifies for Shingles Vaccine? Yes   Zostavax completed Yes   Shingrix Completed?: Yes  Screening Tests Health Maintenance  Topic Date Due  FOOT EXAM  Never done   HEMOGLOBIN A1C  12/10/2022   Diabetic kidney evaluation - Urine ACR  12/16/2022 (Originally 09/09/1969)   COVID-19 Vaccine (7 - 2023-24 season) 12/31/2022 (Originally 08/16/2022)   DTaP/Tdap/Td (3 - Td or Tdap) 09/13/2023   OPHTHALMOLOGY EXAM  10/16/2023   Diabetic kidney evaluation - eGFR measurement  11/26/2023   Medicare Annual Wellness (AWV)  12/15/2023   COLONOSCOPY (Pts 45-15yrs Insurance coverage will need to be confirmed)  07/22/2029   Pneumonia Vaccine 52+ Years old  Completed   INFLUENZA VACCINE  Completed   Hepatitis C Screening  Completed   Zoster Vaccines- Shingrix  Completed   HPV VACCINES  Aged Out    Health Maintenance  Health Maintenance Due  Topic Date Due   FOOT EXAM  Never done   HEMOGLOBIN A1C  12/10/2022    Colorectal cancer screening: Type of screening: Colonoscopy. Completed 07/22/22. Repeat every   years  Lung Cancer Screening: (Low Dose CT Chest recommended if Age 108-80 years,  30 pack-year currently smoking OR have quit w/in 15years.) does not qualify.     Additional Screening:  Hepatitis C Screening: does qualify; Completed 04/15/16  Vision Screening: Recommended annual ophthalmology exams for early detection of glaucoma and other disorders of the eye. Is the patient up to date with their annual eye exam?  Yes  Who is the provider or what is the name of the office in which the patient attends annual eye exams? [facility] If pt is not established with a provider, would they like to be referred to a provider to establish care? No .   Dental Screening: Recommended annual dental exams for proper oral hygiene  Community Resource Referral / Chronic Care Management:  CRR required this visit?  No   CCM required this visit?  No      Plan:     I have personally reviewed and noted the following in the patient's chart:   Medical and social history Use of alcohol, tobacco or illicit drugs  Current medications and supplements including opioid prescriptions. Patient is not currently taking opioid prescriptions. Functional ability and status Nutritional status Physical activity Advanced directives List of other physicians Hospitalizations, surgeries, and ER visits in previous 12 months Vitals Screenings to include cognitive, depression, and falls Referrals and appointments  In addition, I have reviewed and discussed with patient certain preventive protocols, quality metrics, and best practice recommendations. A written personalized care plan for preventive services as well as general preventive health recommendations were provided to patient.     Criselda Peaches, LPN   [id number]   Nurse Notes: Patient due Diabetic kidney evaluation-Urine ACR

## 2022-12-15 NOTE — Patient Instructions (Addendum)
[name] , Thank you for taking time to come for your Medicare Wellness Visit. I appreciate your ongoing commitment to your health goals. Please review the following plan we discussed and let me know if I can assist you in the future.   These are the goals we discussed:  Goals       Blood Pressure < 130/80      Exercise 3x per week (30 min per time)      Manage My Medicine      Timeframe:  Short-Term Goal Priority:  High Start Date:                             Expected End Date:                       Follow Up Date 04/18/21    - keep a list of all the medicines I take; vitamins and herbals too - use a pillbox to sort medicine - use an alarm clock or phone to remind me to take my medicine    Why is this important?   These steps will help you keep on track with your medicines.   Notes:       patient      Continue to focus on physical activity      Patient Stated      Engaging with new people, meeting new people  Try to find a barn that will let you ride  [employer] [place] - They have weekend camping and daytime [place] horse  Therapeutic horse farm  Likes classic movies and can check this out Continue to walking   Develop other interest       Patient Stated      12/09/2021, continue low fat low salt diet, get out       Reduce sugar intake to X grams per day      Monitor for HFCS in ketchup; salad dressing, health bars etc  cut back on pasta       Stay Active (pt-stated)        This is a list of the screening recommended for you and due dates:  Health Maintenance  Topic Date Due   Complete foot exam   Never done   Hemoglobin A1C  12/10/2022   Yearly kidney health urinalysis for diabetes  12/16/2022*   COVID-19 Vaccine (7 - 2023-24 season) 12/31/2022*   DTaP/Tdap/Td vaccine (3 - Td or Tdap) 09/13/2023   Eye exam for diabetics  10/16/2023   Yearly kidney function blood test for diabetes  11/26/2023   Medicare Annual Wellness Visit  12/15/2023   Colon Cancer  Screening  07/22/2029   Pneumonia Vaccine  Completed   Flu Shot  Completed   Hepatitis C Screening: USPSTF Recommendation to screen - Ages 18-79 yo.  Completed   Zoster (Shingles) Vaccine  Completed   HPV Vaccine  Aged Out  *Topic was postponed. The date shown is not the original due date.    Advanced directives: In Chart  Conditions/risks identified: None  Next appointment: Follow up in one year for your annual wellness visit.   Preventive Care 49 Years and Older, Male  Preventive care refers to lifestyle choices and visits with your health care provider that can promote health and wellness. What does preventive care include? A yearly physical exam. This is also called an annual well check. Dental exams once or twice a year. Routine  eye exams. Ask your health care provider how often you should have your eyes checked. Personal lifestyle choices, including: Daily care of your teeth and gums. Regular physical activity. Eating a healthy diet. Avoiding tobacco and drug use. Limiting alcohol use. Practicing safe sex. Taking low doses of aspirin every day. Taking vitamin and mineral supplements as recommended by your health care provider. What happens during an annual well check? The services and screenings done by your health care provider during your annual well check will depend on your age, overall health, lifestyle risk factors, and family history of disease. Counseling  Your health care provider may ask you questions about your: Alcohol use. Tobacco use. Drug use. Emotional well-being. Home and relationship well-being. Sexual activity. Eating habits. History of falls. Memory and ability to understand (cognition). Work and work Statistician. Screening  You may have the following tests or measurements: Height, weight, and BMI. Blood pressure. Lipid and cholesterol levels. These may be checked every 5 years, or more frequently if you are over 58 years old. Skin  check. Lung cancer screening. You may have this screening every year starting at age 54 if you have a 30-pack-year history of smoking and currently smoke or have quit within the past 15 years. Fecal occult blood test (FOBT) of the stool. You may have this test every year starting at age 53. Flexible sigmoidoscopy or colonoscopy. You may have a sigmoidoscopy every 5 years or a colonoscopy every 10 years starting at age 2. Prostate cancer screening. Recommendations will vary depending on your family history and other risks. Hepatitis C blood test. Hepatitis B blood test. Sexually transmitted disease (STD) testing. Diabetes screening. This is done by checking your blood sugar (glucose) after you have not eaten for a while (fasting). You may have this done every 1-3 years. Abdominal aortic aneurysm (AAA) screening. You may need this if you are a current or former smoker. Osteoporosis. You may be screened starting at age 34 if you are at high risk. Talk with your health care provider about your test results, treatment options, and if necessary, the need for more tests. Vaccines  Your health care provider may recommend certain vaccines, such as: Influenza vaccine. This is recommended every year. Tetanus, diphtheria, and acellular pertussis (Tdap, Td) vaccine. You may need a Td booster every 10 years. Zoster vaccine. You may need this after age 61. Pneumococcal 13-valent conjugate (PCV13) vaccine. One dose is recommended after age 42. Pneumococcal polysaccharide (PPSV23) vaccine. One dose is recommended after age 56. Talk to your health care provider about which screenings and vaccines you need and how often you need them. This information is not intended to replace advice given to you by your health care provider. Make sure you discuss any questions you have with your health care provider. Document Released: 10/05/2015 Document Revised: 05/28/2016 Document Reviewed: 07/10/2015 Elsevier Interactive  Patient Education  2017 [place] Prevention in the Home Falls can cause injuries. They can happen to people of all ages. There are many things you can do to make your home safe and to help prevent falls. What can I do on the outside of my home? Regularly fix the edges of walkways and driveways and fix any cracks. Remove anything that might make you trip as you walk through a door, such as a raised step or threshold. Trim any bushes or trees on the path to your home. Use bright outdoor lighting. Clear any walking paths of anything that might make someone trip, such  as rocks or tools. Regularly check to see if handrails are loose or broken. Make sure that both sides of any steps have handrails. Any raised decks and porches should have guardrails on the edges. Have any leaves, snow, or ice cleared regularly. Use sand or salt on walking paths during winter. Clean up any spills in your garage right away. This includes oil or grease spills. What can I do in the bathroom? Use night lights. Install grab bars by the toilet and in the tub and shower. Do not use towel bars as grab bars. Use non-skid mats or decals in the tub or shower. If you need to sit down in the shower, use a plastic, non-slip stool. Keep the floor dry. Clean up any water that spills on the floor as soon as it happens. Remove soap buildup in the tub or shower regularly. Attach bath mats securely with double-sided non-slip rug tape. Do not have throw rugs and other things on the floor that can make you trip. What can I do in the bedroom? Use night lights. Make sure that you have a light by your bed that is easy to reach. Do not use any sheets or blankets that are too big for your bed. They should not hang down onto the floor. Have a firm chair that has side arms. You can use this for support while you get dressed. Do not have throw rugs and other things on the floor that can make you trip. What can I do in the  kitchen? Clean up any spills right away. Avoid walking on wet floors. Keep items that you use a lot in easy-to-reach places. If you need to reach something above you, use a strong step stool that has a grab bar. Keep electrical cords out of the way. Do not use floor polish or wax that makes floors slippery. If you must use wax, use non-skid floor wax. Do not have throw rugs and other things on the floor that can make you trip. What can I do with my stairs? Do not leave any items on the stairs. Make sure that there are handrails on both sides of the stairs and use them. Fix handrails that are broken or loose. Make sure that handrails are as long as the stairways. Check any carpeting to make sure that it is firmly attached to the stairs. Fix any carpet that is loose or worn. Avoid having throw rugs at the top or bottom of the stairs. If you do have throw rugs, attach them to the floor with carpet tape. Make sure that you have a light switch at the top of the stairs and the bottom of the stairs. If you do not have them, ask someone to add them for you. What else can I do to help prevent falls? Wear shoes that: Do not have high heels. Have rubber bottoms. Are comfortable and fit you well. Are closed at the toe. Do not wear sandals. If you use a stepladder: Make sure that it is fully opened. Do not climb a closed stepladder. Make sure that both sides of the stepladder are locked into place. Ask someone to hold it for you, if possible. Clearly mark and make sure that you can see: Any grab bars or handrails. First and last steps. Where the edge of each step is. Use tools that help you move around (mobility aids) if they are needed. These include: Canes. Walkers. Scooters. Crutches. Turn on the lights when you go into a dark  area. Replace any light bulbs as soon as they burn out. Set up your furniture so you have a clear path. Avoid moving your furniture around. If any of your floors are  uneven, fix them. If there are any pets around you, be aware of where they are. Review your medicines with your doctor. Some medicines can make you feel dizzy. This can increase your chance of falling. Ask your doctor what other things that you can do to help prevent falls. This information is not intended to replace advice given to you by your health care provider. Make sure you discuss any questions you have with your health care provider. Document Released: 07/05/2009 Document Revised: 02/14/2016 Document Reviewed: 10/13/2014 Elsevier Interactive Patient Education  2017 [employer].

## 2022-12-25 DIAGNOSIS — L821 Other seborrheic keratosis: Secondary | ICD-10-CM | POA: Diagnosis not present

## 2022-12-25 DIAGNOSIS — D2372 Other benign neoplasm of skin of left lower limb, including hip: Secondary | ICD-10-CM | POA: Diagnosis not present

## 2022-12-25 DIAGNOSIS — D225 Melanocytic nevi of trunk: Secondary | ICD-10-CM | POA: Diagnosis not present

## 2022-12-25 DIAGNOSIS — L814 Other melanin hyperpigmentation: Secondary | ICD-10-CM | POA: Diagnosis not present

## 2022-12-25 DIAGNOSIS — C4442 Squamous cell carcinoma of skin of scalp and neck: Secondary | ICD-10-CM | POA: Diagnosis not present

## 2022-12-25 DIAGNOSIS — L570 Actinic keratosis: Secondary | ICD-10-CM | POA: Diagnosis not present

## 2022-12-25 DIAGNOSIS — Z85820 Personal history of malignant melanoma of skin: Secondary | ICD-10-CM | POA: Diagnosis not present

## 2022-12-25 DIAGNOSIS — C44329 Squamous cell carcinoma of skin of other parts of face: Secondary | ICD-10-CM | POA: Diagnosis not present

## 2022-12-25 DIAGNOSIS — Z85828 Personal history of other malignant neoplasm of skin: Secondary | ICD-10-CM | POA: Diagnosis not present

## 2022-12-25 DIAGNOSIS — L820 Inflamed seborrheic keratosis: Secondary | ICD-10-CM | POA: Diagnosis not present

## 2023-01-02 ENCOUNTER — Telehealth: Payer: Self-pay

## 2023-01-02 NOTE — Progress Notes (Signed)
Care Management & Coordination Services Pharmacy Team  Reason for Encounter: Appointment Reminder  Contacted patient to confirm telephone appointment with [name], PharmD on 01/05/2023 at 11:00. Spoke with patient on 01/02/2023   Do you have any problems getting your medications? No  What is your top health concern you would like to discuss at your upcoming visit? Patient denies  Have you seen any other providers since your last visit with PCP? Patient mentions has seen his cardiologist.   Care Gaps: AWV - completed 10/15/2022, scheduled 12/21/2023 Last eye exam - 10/15/2022 Last foot exam - never done Last BP - 126/70 on 11/26/2022 Last A1C - 5.8 on 06/11/2022 Urine ACR - never done Covid - overdue   Star Rating Drugs: Farxiga 10 mg - last filled 12/12/2022 [facility] Rosuvastatin 10 mg - last filled 12/12/2022 30 [facility]  [name] [facility]  Clinical Pharmacist Assistant [phone]

## 2023-01-02 NOTE — Progress Notes (Unsigned)
Care Management & Coordination Services Pharmacy Note  01/05/2023 Name:  [name] MRN:  [id number] DOB:  [birth year]  Summary: BP at goal <130/80 LDL at goal <70  Recommendations/Changes made from today's visit: -Continue checking BP twice daily and keep a log, per cardio and limit salt intake -Continue daily exercise for a total of at least 150 min/week  Follow up plan: BP review in 3 months Pharmacist visit in 6 months   Subjective: [name] is an 72 y.o. year old male who is a primary patient of Dalton Salisbury, MD.  The care coordination team was consulted for assistance with disease management and care coordination needs.    Engaged with patient by telephone for follow up visit.  Recent office visits: None  Recent consult visits: 11/26/22 Sande Rives, MD (Cardiology) - Patient presented for Coronary Artery disease of native artery of native heart with stable angina pectoris and other concerns. No medication changes   Hospital visits: None in previous 6 months   Objective:  Lab Results  Component Value Date   CREATININE 1.53 (H) 11/26/2022   BUN 34 (H) 11/26/2022   GFR 40.27 (L) 06/11/2022   EGFR 48 (L) 11/26/2022   GFRNONAA 47 (L) 12/31/2021   GFRAA 81 10/25/2019   NA 138 11/26/2022   K 4.7 11/26/2022   CALCIUM 9.4 11/26/2022   CO2 21 11/26/2022   GLUCOSE 90 11/26/2022    Lab Results  Component Value Date/Time   HGBA1C 5.8 06/11/2022 08:48 AM   HGBA1C 5.3 02/21/2022 01:49 PM   HGBA1C 6.0 05/30/2021 08:42 AM   GFR 40.27 (L) 06/11/2022 08:48 AM   GFR 29.72 (L) 05/30/2021 08:42 AM    Last diabetic Eye exam:  Lab Results  Component Value Date/Time   HMDIABEYEEXA No Retinopathy 10/15/2022 05:03 PM    Last diabetic Foot exam: No results found for: "HMDIABFOOTEX"   Lab Results  Component Value Date   CHOL 121 06/11/2022   HDL 47.70 06/11/2022   LDLCALC 59 06/11/2022   LDLDIRECT 141.9 04/06/2013   TRIG 70.0 06/11/2022   CHOLHDL  3 06/11/2022       Latest Ref Rng & Units 11/26/2022    1:53 PM 06/11/2022    8:48 AM 05/30/2021    8:42 AM  Hepatic Function  Total Protein 6.0 - 8.5 g/dL 6.8  7.2  6.8   Albumin 3.8 - 4.8 g/dL 4.7  4.3  4.2   AST 0 - 40 IU/L 24  23  18    ALT 0 - 44 IU/L 24  29  19    Alk Phosphatase 44 - 121 IU/L 40  34  38   Total Bilirubin 0.0 - 1.2 mg/dL 0.5  0.6  0.5   Bilirubin, Direct 0.0 - 0.3 mg/dL  0.1  0.1     Lab Results  Component Value Date/Time   TSH 1.390 11/26/2022 01:53 PM   TSH 2.30 06/11/2022 08:48 AM   FREET4 0.89 12/16/2016 02:44 PM       Latest Ref Rng & Units 11/26/2022    1:53 PM 06/11/2022    8:48 AM 06/18/2021    2:47 PM  CBC  WBC 3.4 - 10.8 x10E3/uL 6.2  5.5  6.4   Hemoglobin 13.0 - 17.7 g/dL 95.2  84.1  32.4   Hematocrit 37.5 - 51.0 % 45.8  42.1  37.6   Platelets 150 - 450 x10E3/uL 162  148.0  238     No results found for: "VD25OH", "VITAMINB12"  Clinical  ASCVD: Yes  The ASCVD Risk score (Arnett DK, et al., 2019) failed to calculate for the following reasons:   The patient has a prior MI or stroke diagnosis       12/15/2022   10:54 AM 08/18/2022    1:49 PM 06/11/2022    8:51 AM  Depression screen PHQ 2/9  Decreased Interest 0 0 0  Down, Depressed, Hopeless 0 0 0  PHQ - 2 Score 0 0 0  Altered sleeping  1 0  Tired, decreased energy  1 0  Change in appetite  0 0  Feeling bad or failure about yourself   0 0  Trouble concentrating  0 0  Moving slowly or fidgety/restless  0 0  Suicidal thoughts  0 0  PHQ-9 Score  2 0  Difficult doing work/chores  Not difficult at all Not difficult at all     Social History   Tobacco Use  Smoking Status Never  Smokeless Tobacco Never   BP Readings from Last 3 Encounters:  11/26/22 126/70  09/24/22 124/72  08/18/22 118/74   Pulse Readings from Last 3 Encounters:  11/26/22 65  09/24/22 69  08/18/22 72   Wt Readings from Last 3 Encounters:  12/15/22 180 lb 9.6 oz (81.9 kg)  11/26/22 183 lb (83 kg)  08/18/22 185  lb (83.9 kg)   BMI Readings from Last 3 Encounters:  12/15/22 24.49 kg/m  11/26/22 24.82 kg/m  08/18/22 25.09 kg/m    Allergies  Allergen Reactions   Codeine Other (See Comments)    hallucinations   Lisinopril Cough   Nsaids     Other reaction(s): Unknown    Medications Reviewed Today     Reviewed by Sherrill Raring, RPH (Pharmacist) on 01/05/23 at 1108  Med List Status: <None>   Medication Order Taking? Sig Documenting Provider Last Dose Status Informant  aspirin 81 MG chewable tablet [id number] No Chew 1 tablet (81 mg total) by mouth daily. Dalton Salisbury, MD Taking Active Self  carvedilol (COREG) 3.125 MG tablet [id number]  TAKE ONE TABLET BY MOUTH EVERY MORNING and TAKE ONE TABLET BY MOUTH EVERY EVENING Dalton Salisbury, MD  Active   Cholecalciferol (VITAMIN D) 50 MCG (2000 UT) CAPS [id number] No Take 1 capsule by mouth daily. [provider] Taking Active   diclofenac Sodium (VOLTAREN) 1 % GEL [id number] No Apply 2 g topically 2 (two) times daily as needed (pain). [provider] Taking Active Self  DULoxetine (CYMBALTA) 20 MG capsule [id number] No TAKE ONE CAPSULE BY MOUTH DAILY Dalton Salisbury, MD Taking Active   ENTRESTO 24-26 MG [id number] No TAKE ONE TABLET BY MOUTH twice daily Bensimhon, Bevelyn Buckles, MD Taking Active   FARXIGA 10 MG TABS tablet [id number] No TAKE ONE TABLET BY MOUTH BEFORE Jennings, Anderson Malta, FNP Taking Active   finasteride ([name]) 5 MG tablet [id number] No Take 1 tablet (5 mg total) by mouth daily. Dalton Salisbury, MD Taking Active   ketoconazole (NIZORAL) 2 % cream [id number] No Apply 1 application topically daily as needed for irritation. [provider] Taking Active Self           Med Note Lisette Grinder   Thu Mar 20, 2022  6:26 PM) prn  LORazepam (ATIVAN) 2 MG tablet [id number] No Take 1 tablet (2 mg total) by mouth every 6 (six) hours as needed for anxiety. Dalton Salisbury, MD Taking Active   melatonin 3 MG TABS tablet  [id number] No Take 1 tablet  by mouth daily. [provider] Taking Active   nitroGLYCERIN (NITROSTAT) 0.4 MG SL tablet [id number] No Place 1 tablet (0.4 mg total) under the tongue every 5 (five) minutes as needed for chest pain. Dalton Salisbury, MD Taking Active Self  omeprazole (PRILOSEC) 20 MG capsule [id number] No TAKE ONE CAPSULE BY MOUTH daily as needed Dalton Salisbury, MD Taking Active   rosuvastatin (CRESTOR) 10 MG tablet [id number] No TAKE ONE TABLET BY MOUTH ONCE DAILY Dalton Salisbury, MD Taking Active   sildenafil (REVATIO) 20 MG tablet [id number] No Take 20 mg by mouth as needed. [provider] Taking Active   Discontinued 12/17/11 1519 (Error)   triamcinolone cream (KENALOG) 0.1 % [id number] No Apply 1 application topically 2 (two) times daily as needed (psoriasis). [provider] Taking Active Self            SDOH:  (Social Determinants of Health) assessments and interventions performed: Yes SDOH Interventions    Flowsheet Row Care Coordination from 01/05/2023 in [facility] Clinical Support from 12/15/2022 in [facility] [place] Chronic Care Management from 06/04/2022 in [facility] at [place] CARDIAC REHAB PHASE II ORIENTATION from 07/02/2021 in [facility] Chronic Care Management from 03/07/2021 in [facility] [place] Chronic Care Management from 02/26/2021 in [facility] at [place]  SDOH Interventions        Food Insecurity Interventions Intervention Not Indicated Intervention Not Indicated -- -- -- Intervention Not Indicated  Housing Interventions Intervention Not Indicated Intervention Not Indicated -- -- -- Intervention Not Indicated  Transportation Interventions -- Intervention Not Indicated -- -- Intervention Not Indicated Intervention Not Indicated  [friends driving to appts has number for [facility]  transportation is needed]  Utilities Interventions -- Intervention Not Indicated -- -- -- --  Alcohol Usage Interventions -- Intervention Not Indicated (Score <7) -- -- -- --  Depression Interventions/Treatment  -- -- -- Medication -- --  Financial Strain Interventions -- Intervention Not Indicated Intervention Not Indicated -- Intervention Not Indicated --  Physical Activity Interventions -- Intervention Not Indicated -- -- -- --  Stress Interventions -- Intervention Not Indicated -- -- -- --  Social Connections Interventions -- Intervention Not Indicated -- -- -- --       Medication Assistance: None required.  Patient affirms current coverage meets needs.  Medication Access: Within the past 30 days, how often has patient missed a dose of medication? None Is a pillbox or other method used to improve adherence? Yes  Factors that may affect medication adherence? no barriers identified Are meds synced by current pharmacy? Yes  Are meds delivered by current pharmacy? Yes  Does patient experience delays in picking up medications due to transportation concerns? No   [facility] Reviewed: Is patient disadvantaged to use [facility]?: No  Current Rx insurance plan: [employer] Name and location of Current pharmacy:  [facility] - [place], [place] - [address] [place] [address] Phone: [phone] Fax: [phone]  [id number] [place], [place] - [address] [place] [address] Phone: [phone] Fax: [phone]  [id number] - [place], [place] - [address] [place] [address] Phone: [phone] Fax: [phone]  [facility] services reviewed with patient today?: No  Patient requests to transfer care to [facility]?: No  Reason patient declined  to change pharmacies: Patient is already actively enrolled with [facility]  Compliance/Adherence/Medication fill history: Care Gaps: AWV - completed 10/15/2022, scheduled 12/21/2023 Last eye exam - 10/15/2022 Last foot exam - never done Last BP - 126/70 on 11/26/2022 Last A1C - 5.8 on 06/11/2022 Urine ACR - never done Covid - overdue  Star-Rating Drugs: Farxiga 10 mg - last filled 12/12/2022 30 [facility] Rosuvastatin 10 mg - last filled 12/12/2022 30 [facility]   Assessment/Plan   Hypertension (BP goal <130/80) -Controlled -Current treatment: Carvedilol 3.125mg  BID Appropriate, Effective, Safe, Accessible Entresto 24-26mg  BID Appropriate, Effective, Safe, Accessible -Medications previously tried: Amlodipine, Hydralazine, HCTZ, Lisinopril, Losartan, Metoprolol, Spironolactone -Current home readings: 133/68 this morning, yesterday evening 124/67 -Current dietary habits: mindful of salt intake -Current exercise habits: goes to [facility], walks dogs, does PT exercises -Denies hypotensive/hypertensive symptoms -Educated on BP goals and benefits of medications for prevention of heart attack, stroke and kidney damage; Daily salt intake goal < 2300 mg; Exercise goal of 150 minutes per week; Importance of home blood pressure monitoring; Proper BP monitoring technique; -Counseled to monitor BP at home BID, document, and provide log at future appointments -Recommended to continue current medication  Hyperlipidemia: (LDL goal < 70) -Controlled -Current treatment: Rosuvastatin  1 qd Appropriate, Effective, Safe, Accessible -Medications previously tried: None  -Current dietary patterns: limits fried, fatty foods -Current exercise habits: see above -Educated on Cholesterol goals;  Benefits of statin for ASCVD risk reduction; Importance of limiting foods high in cholesterol; Exercise goal of 150 minutes per week; -Recommended to continue current medication  Chronic Kidney Disease Stage 3b  -All medications assessed for renal dosing and  appropriateness in chronic kidney disease. -Counseled on diet and exercise extensively  Sherrill Raring Clinical Pharmacist [phone]

## 2023-01-05 ENCOUNTER — Ambulatory Visit: Payer: Medicare PPO

## 2023-01-27 ENCOUNTER — Telehealth (HOSPITAL_COMMUNITY): Payer: Self-pay | Admitting: Emergency Medicine

## 2023-01-27 NOTE — Telephone Encounter (Signed)
Attempted to call patient regarding upcoming cardiac PET appointment. Left message on voicemail with name and callback number Mandela Bello RN Navigator Cardiac Imaging [facility] [phone] Office [phone] Cell  

## 2023-01-27 NOTE — Telephone Encounter (Signed)
Reaching out to patient to offer assistance regarding upcoming cardiac imaging study; pt verbalizes understanding of appt date/time, parking situation and where to check in, pre-test NPO status and medications ordered, and verified current allergies; name and call back number provided for further questions should they arise Amed Datta RN Navigator Cardiac Imaging [facility] [phone] office [phone] cell 

## 2023-01-28 ENCOUNTER — Encounter (HOSPITAL_COMMUNITY)
Admission: RE | Admit: 2023-01-28 | Discharge: 2023-01-28 | Disposition: A | Discharge status: Home / Self Care | Payer: Medicare PPO | Source: Ambulatory Visit | Attending: Cardiovascular Disease | Admitting: Cardiovascular Disease

## 2023-01-28 DIAGNOSIS — I25118 Atherosclerotic heart disease of native coronary artery with other forms of angina pectoris: Secondary | ICD-10-CM | POA: Diagnosis not present

## 2023-01-28 LAB — NM PET CT CARDIAC PERFUSION MULTI W/ABSOLUTE BLOODFLOW
MBFR: 2.07
Nuc Rest EF: 58 %
Nuc Stress EF: 68 %
Rest MBF: 0.9 ml/g/min
ST Depression (mm): 0 mm
Stress MBF: 1.86 ml/g/min
TID: 0.87

## 2023-01-28 MED ORDER — RUBIDIUM RB82 GENERATOR (RUBYFILL)
21.1000 | PACK | Freq: Once | INTRAVENOUS | Status: AC
  Administered 2023-01-28: 21.1 via INTRAVENOUS

## 2023-01-28 MED ORDER — REGADENOSON 0.4 MG/5ML IV SOLN
0.4000 mg | Freq: Once | INTRAVENOUS | Status: AC
  Administered 2023-01-28: 0.4 mg via INTRAVENOUS

## 2023-01-28 MED ORDER — REGADENOSON 0.4 MG/5ML IV SOLN
INTRAVENOUS | Status: AC
  Filled 2023-01-28: qty 5

## 2023-01-28 MED ORDER — RUBIDIUM RB82 GENERATOR (RUBYFILL)
21.2000 | PACK | Freq: Once | INTRAVENOUS | Status: AC
  Administered 2023-01-28: 21.2 via INTRAVENOUS

## 2023-01-29 ENCOUNTER — Telehealth: Payer: Self-pay | Admitting: Family Medicine

## 2023-01-29 NOTE — Telephone Encounter (Signed)
I advised [name] it was fine to set up with Dr Alphonsus Sias.

## 2023-01-29 NOTE — Telephone Encounter (Signed)
Yes that is fine with me

## 2023-01-29 NOTE — Telephone Encounter (Signed)
Patient has been scheduled

## 2023-01-29 NOTE — Telephone Encounter (Signed)
Patient called in and stated that he would like to transfer his care over to Dr. Alphonsus Sias here at [facility] as it is closer to his home. I did inform patient that Dr. Alphonsus Sias will be retiring next year and he stated that is fine. Will this be fine? Please advise. Thank you!

## 2023-01-30 ENCOUNTER — Ambulatory Visit (HOSPITAL_COMMUNITY): Payer: Self-pay

## 2023-01-30 ENCOUNTER — Ambulatory Visit: Payer: Medicare PPO | Admitting: Primary Care

## 2023-01-30 ENCOUNTER — Encounter: Payer: Self-pay | Admitting: Primary Care

## 2023-01-30 VITALS — BP 126/68 | HR 73 | Temp 98.9°F | Ht 72.0 in | Wt 183.0 lb

## 2023-01-30 DIAGNOSIS — M79604 Pain in right leg: Secondary | ICD-10-CM | POA: Diagnosis not present

## 2023-01-30 DIAGNOSIS — M79606 Pain in leg, unspecified: Secondary | ICD-10-CM | POA: Insufficient documentation

## 2023-01-30 MED ORDER — TIZANIDINE HCL 4 MG PO TABS
4.0000 mg | ORAL_TABLET | Freq: Three times a day (TID) | ORAL | 0 refills | Status: DC | PRN
Start: 2023-01-30 — End: 2023-02-18

## 2023-01-30 NOTE — Patient Instructions (Signed)
You may take the tizanidine every 8 hours as needed to relax the muscles. This may cause drowsiness.  Continue Tylenol, stretching, heating pad as needed.  It was a pleasure meeting you!

## 2023-01-30 NOTE — Progress Notes (Signed)
Subjective:    Patient ID: [name], male    DOB: May 07, 1951, 72 y.o.   MRN: [id number]  Leg Pain  Pertinent negatives include no numbness.    [name] is a very pleasant 72 y.o. male patient of Dr. Clent Ridges with a history of CAD, type 2 diabetes, chronic neck pain, tremor of right hand, lower extremity paresthesias, CKD, plantar fasciitis who presents today to discuss lower extremity pain.  His pain is located to the right posterior thigh with radiation to his distal right buttock. His pain was initially located mostly to the posterior mid thigh which began 1 month ago.   Yesterday he was walking his dog, they walked by several other dogs, his dog pulled him one direction and he tripped over the top of the cement curb. He fell forward onto both knees and hands landing on the grass. His fall was "soft" with minimal impact. Later on yesterday his lower extremity pain began radiating from the mid posterior thigh to the lower buttocks. He describes his pain as a pulling and tightness.   He underwent lumbar spine xrays in September 2023 which revealed levoscoliosis, moderate to severe DDD. He's since completed physical therapy earlier this year. He is typically faithful with his home PT exercises.   His right lower extremity pain is worse with sitting and stiffness. He denies lower extremity numbness, loss of bowel/bladder control, hitting his head. His knees do not bother him.   He's been using a heating pad and taking Tylenol. The heating pad has helped.   Review of Systems  Musculoskeletal:  Positive for arthralgias, back pain and myalgias.  Neurological:  Negative for weakness and numbness.         Past Medical History:  Diagnosis Date   Anoxic brain injury (HCC)    Anxiety    Arthritis    Cancer (HCC) 2010   basal cell ca, head   Cataract    bilateral   CHF (congestive heart failure) (HCC)    Coronary artery disease    Depression    ED (erectile dysfunction)     GERD (gastroesophageal reflux disease)    Heart failure (HCC)    Hyperlipidemia    Hypertension    Kidney stone 07/29/2011   passed    Myocardial infarction [facility])    Neuromuscular disorder (HCC)    neuropathy legs   Psoriasis    Vitreous floater    left eye, sees Dr. 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[place] - [employer]  Tobacco Use   Smoking status: Never   Smokeless tobacco: Never  Vaping Use   Vaping Use: Never used  Substance and Sexual Activity   Alcohol use: No    Alcohol/week: 0.0 standard drinks of alcohol   Drug use: No   Sexual activity: Not on file  Other Topics Concern   Not on file  Social History Narrative   Not on file   Social Determinants of Health   Financial Resource Strain: Low Risk  (12/15/2022)   Overall Financial Resource Strain (CARDIA)    Difficulty of Paying Living Expenses: Not hard at all  Food  Insecurity: No Food Insecurity (01/05/2023)   Hunger Vital Sign    Worried About Running Out of Food in the Last Year: Never true    Ran Out of Food in the Last Year: Never true  Transportation Needs: No Transportation Needs (12/15/2022)   PRAPARE - Administrator, Civil Service (Medical): No    Lack of Transportation (Non-Medical): No  Physical Activity: Sufficiently Active (12/15/2022)   Exercise Vital Sign    Days of Exercise per Week: 7 days    Minutes of Exercise per Session: 30 min  Stress: No Stress Concern Present (12/15/2022)   [employer] of Occupational Health - Occupational Stress Questionnaire    Feeling of Stress : Not at all  Social Connections: Moderately Isolated (12/15/2022)   Social Connection and Isolation Panel [NHANES]     Frequency of Communication with Friends and Family: More than three times a week    Frequency of Social Gatherings with Friends and Family: More than three times a week    Attends Religious Services: Never    Database administrator or Organizations: Yes    Attends Engineer, structural: More than 4 times per year    Marital Status: Never married  Intimate Partner Violence: Not At Risk (12/15/2022)   Humiliation, Afraid, Rape, and Kick questionnaire    Fear of Current or Ex-Partner: No    Emotionally Abused: No    Physically Abused: No    Sexually Abused: No    Past Surgical History:  Procedure Laterality Date   BASAL CELL CARCINOMA EXCISION     removed from scalp   COLONOSCOPY  07/03/2017   per Dr. Adela Lank, sessile serrated polyp, repeat in 5 yrs    HERNIA REPAIR     LEFT HEART CATH AND CORONARY ANGIOGRAPHY N/A 01/03/2021   Procedure: LEFT HEART CATH AND CORONARY ANGIOGRAPHY;  Surgeon: Lennette Bihari, MD;  Location: [facility];  Service: Cardiovascular;  Laterality: N/A;   RIGHT HEART CATH N/A 01/03/2021   Procedure: RIGHT HEART CATH;  Surgeon: Dolores Patty, MD;  Location: [facility];  Service: Cardiovascular;  Laterality: N/A;    Family History  Problem Relation Age of Onset   Hypertension Mother    Hyperlipidemia Mother    Depression Other    Diabetes Other    Hyperlipidemia Other    Hypertension Other    Alzheimer's disease Other    Colon cancer Neg Hx    Stomach cancer Neg Hx    Rectal cancer Neg Hx    Esophageal cancer Neg Hx     Allergies  Allergen Reactions   Codeine Other (See Comments)    hallucinations   Lisinopril Cough   Nsaids     Other reaction(s): Unknown    Current Outpatient Medications on File Prior to Visit  Medication Sig Dispense Refill   aspirin 81 MG chewable tablet Chew 1 tablet (81 mg total) by mouth daily. 90 tablet 0   carvedilol (COREG) 3.125 MG tablet TAKE ONE TABLET BY MOUTH EVERY MORNING and TAKE ONE  TABLET BY MOUTH EVERY EVENING 180 tablet 0   Cholecalciferol (VITAMIN D) 50 MCG (2000 UT) CAPS Take 1 capsule by mouth daily.     diclofenac Sodium (VOLTAREN) 1 % GEL Apply 2 g topically 2 (two) times daily as needed (pain).     DULoxetine (CYMBALTA) 20 MG capsule TAKE ONE CAPSULE BY MOUTH DAILY 90 capsule 0   ENTRESTO 24-26 MG TAKE ONE TABLET BY  MOUTH twice daily 60 tablet 11   FARXIGA 10 MG TABS tablet TAKE ONE TABLET BY MOUTH BEFORE BREAKFAST 90 tablet 3   finasteride ([name]) 5 MG tablet Take 1 tablet (5 mg total) by mouth daily. 90 tablet 3   ketoconazole (NIZORAL) 2 % cream Apply 1 application topically daily as needed for irritation.     LORazepam (ATIVAN) 2 MG tablet Take 1 tablet (2 mg total) by mouth every 6 (six) hours as needed for anxiety. 120 tablet 5   melatonin 3 MG TABS tablet Take 1 tablet by mouth daily.     nitroGLYCERIN (NITROSTAT) 0.4 MG SL tablet Place 1 tablet (0.4 mg total) under the tongue every 5 (five) minutes as needed for chest pain. 50 tablet 3   omeprazole (PRILOSEC) 20 MG capsule TAKE ONE CAPSULE BY MOUTH daily as needed 30 capsule 11   rosuvastatin (CRESTOR) 10 MG tablet TAKE ONE TABLET BY MOUTH ONCE DAILY 90 tablet 11   triamcinolone cream (KENALOG) 0.1 % Apply 1 application topically 2 (two) times daily as needed (psoriasis).     sildenafil (REVATIO) 20 MG tablet Take 20 mg by mouth as needed. (Patient not taking: Reported on 01/30/2023)     [DISCONTINUED] tadalafil (CIALIS) 20 MG tablet Take 20 mg by mouth daily as needed.       No current facility-administered medications on file prior to visit.    BP 126/68   Pulse 73   Temp 98.9 F (37.2 C) (Temporal)   Ht 6' (1.829 m)   Wt 183 lb (83 kg)   SpO2 98%   BMI 24.82 kg/m  Objective:   Physical Exam Cardiovascular:     Rate and Rhythm: Normal rate and regular rhythm.  Pulmonary:     Effort: Pulmonary effort is normal.  Musculoskeletal:     Left lower leg: No swelling or tenderness.        Legs:     Comments: 5/5 strength to bilateral lower extremities.  Ambulates well in the clinic.   Negative straight leg raise.   Skin:    General: Skin is warm and dry.     Comments: No abrasions or bruising to knees or lower extremities.   Neurological:     Mental Status: He is alert.           Assessment & Plan:  Pain of right lower extremity Assessment & Plan: HPI and exam today consistent for MSK involvement. No alarm signs.  Offered PT referral for which he declines. He has several PT exercises that he can do at home. Continue heating pads, walking, stretching.  Continue Tylenol.  Rx for Tizandiine 4 mg sent to pharmacy to use PRN, drowsiness precautions provided.    Orders: -     tiZANidine HCl; Take 1 tablet (4 mg total) by mouth every 8 (eight) hours as needed for muscle spasms.  Dispense: 15 tablet; Refill: 0        Doreene Nest, NP

## 2023-01-30 NOTE — Assessment & Plan Note (Signed)
HPI and exam today consistent for MSK involvement. No alarm signs.  Offered PT referral for which he declines. He has several PT exercises that he can do at home. Continue heating pads, walking, stretching.  Continue Tylenol.  Rx for Tizandiine 4 mg sent to pharmacy to use PRN, drowsiness precautions provided.

## 2023-02-01 ENCOUNTER — Other Ambulatory Visit (HOSPITAL_COMMUNITY): Payer: Self-pay | Admitting: Family Medicine

## 2023-02-02 ENCOUNTER — Telehealth: Payer: Self-pay

## 2023-02-02 NOTE — Progress Notes (Signed)
Care Management & Coordination Services Pharmacy Team  Reason for Encounter: Medication coordination and delivery  Contacted patient to discuss medications and coordinate delivery from [facility]. Spoke with patient on 02/02/2023   Cycle dispensing form sent to [facility] for review.   Last adherence delivery date:01/12/2023      Patient is due for next adherence delivery on: 02/13/2023  This delivery to include: Adherence Packaging  30 Days  Farxiga 10 mg: one tablet before breakfast Rosuvastatin 10 mg: one tablet at breakfast Finasteride  5 mg: one tablet before breakfast Carvedilol  3.125 mg: 1 tablet at breakfast and dinner Entresto 24-26 mg: 1 tablet at breakfast and dinner (90 DS Vial)   Patient declined the following medications this month: Omeprazole 20 mg: 1 tablet daily as needed (vial)  Confirmed delivery date of 02/13/2023, advised patient that pharmacy will contact them the morning of delivery.  Any concerns about your medications? Patient denies  How often do you forget or accidentally miss a dose? Patient denies  Is patient in packaging Yes  If yes  What is the date on your next pill pack? 02/02/2023  Any concerns or issues with your packaging? Patient denies   Recent blood pressure readings are as follows:  02/02/23 - 117/66 AM 02/01/23 - 108/65 AM  122/63 PM     Chart review: Recent office visits:  None  Recent consult visits:  01/30/2023 Vernona Rieger NP - Patient was seen for pain of right lower extremity. Started Tizanidine 4 mg q 8 hours prn.   Hospital visits:  None  Medications: Outpatient Encounter Medications as of 02/02/2023  Medication Sig Note   aspirin 81 MG chewable tablet Chew 1 tablet (81 mg total) by mouth daily.    carvedilol (COREG) 3.125 MG tablet TAKE ONE TABLET BY MOUTH EVERY MORNING and TAKE ONE TABLET BY MOUTH EVERY EVENING    Cholecalciferol (VITAMIN D) 50 MCG (2000 UT) CAPS Take 1 capsule by mouth daily.     diclofenac Sodium (VOLTAREN) 1 % GEL Apply 2 g topically 2 (two) times daily as needed (pain).    DULoxetine (CYMBALTA) 20 MG capsule TAKE ONE CAPSULE BY MOUTH DAILY    ENTRESTO 24-26 MG TAKE ONE TABLET BY MOUTH twice daily    FARXIGA 10 MG TABS tablet TAKE ONE TABLET BY MOUTH BEFORE BREAKFAST    finasteride ([name]) 5 MG tablet Take 1 tablet (5 mg total) by mouth daily.    ketoconazole (NIZORAL) 2 % cream Apply 1 application topically daily as needed for irritation. 03/20/2022: prn   LORazepam (ATIVAN) 2 MG tablet Take 1 tablet (2 mg total) by mouth every 6 (six) hours as needed for anxiety.    melatonin 3 MG TABS tablet Take 1 tablet by mouth daily.    nitroGLYCERIN (NITROSTAT) 0.4 MG SL tablet Place 1 tablet (0.4 mg total) under the tongue every 5 (five) minutes as needed for chest pain.    omeprazole (PRILOSEC) 20 MG capsule TAKE ONE CAPSULE BY MOUTH daily as needed    rosuvastatin (CRESTOR) 10 MG tablet TAKE ONE TABLET BY MOUTH ONCE DAILY    sildenafil (REVATIO) 20 MG tablet Take 20 mg by mouth as needed. (Patient not taking: Reported on 01/30/2023)    tiZANidine (ZANAFLEX) 4 MG tablet Take 1 tablet (4 mg total) by mouth every 8 (eight) hours as needed for muscle spasms.    triamcinolone cream (KENALOG) 0.1 % Apply 1 application topically 2 (two) times daily as needed (psoriasis).    [DISCONTINUED]  tadalafil (CIALIS) 20 MG tablet Take 20 mg by mouth daily as needed.      No facility-administered encounter medications on file as of 02/02/2023.   BP Readings from Last 3 Encounters:  01/30/23 126/68  01/28/23 (!) 102/46  11/26/22 126/70    Pulse Readings from Last 3 Encounters:  01/30/23 73  01/28/23 76  11/26/22 65    Lab Results  Component Value Date/Time   HGBA1C 5.8 06/11/2022 08:48 AM   HGBA1C 5.3 02/21/2022 01:49 PM   HGBA1C 6.0 05/30/2021 08:42 AM   Lab Results  Component Value Date   CREATININE 1.53 (H) 11/26/2022   BUN 34 (H) 11/26/2022   GFR 40.27 (L) 06/11/2022    GFRNONAA 47 (L) 12/31/2021   GFRAA 81 10/25/2019   NA 138 11/26/2022   K 4.7 11/26/2022   CALCIUM 9.4 11/26/2022   CO2 21 11/26/2022   [name] CMA  Clinical Pharmacist Assistant [phone]

## 2023-02-06 ENCOUNTER — Encounter: Payer: Self-pay | Admitting: Internal Medicine

## 2023-02-06 ENCOUNTER — Telehealth: Payer: Self-pay

## 2023-02-06 ENCOUNTER — Ambulatory Visit: Payer: Medicare PPO | Admitting: Internal Medicine

## 2023-02-06 VITALS — BP 122/78 | HR 69 | Temp 98.4°F | Ht 72.0 in | Wt 180.8 lb

## 2023-02-06 DIAGNOSIS — N1832 Chronic kidney disease, stage 3b: Secondary | ICD-10-CM

## 2023-02-06 DIAGNOSIS — M79604 Pain in right leg: Secondary | ICD-10-CM | POA: Insufficient documentation

## 2023-02-06 DIAGNOSIS — I25119 Atherosclerotic heart disease of native coronary artery with unspecified angina pectoris: Secondary | ICD-10-CM | POA: Diagnosis not present

## 2023-02-06 DIAGNOSIS — F39 Unspecified mood [affective] disorder: Secondary | ICD-10-CM

## 2023-02-06 DIAGNOSIS — Z7984 Long term (current) use of oral hypoglycemic drugs: Secondary | ICD-10-CM | POA: Diagnosis not present

## 2023-02-06 DIAGNOSIS — E1122 Type 2 diabetes mellitus with diabetic chronic kidney disease: Secondary | ICD-10-CM | POA: Diagnosis not present

## 2023-02-06 DIAGNOSIS — I5022 Chronic systolic (congestive) heart failure: Secondary | ICD-10-CM | POA: Diagnosis not present

## 2023-02-06 LAB — POCT GLYCOSYLATED HEMOGLOBIN (HGB A1C): Hemoglobin A1C: 5.5 % (ref 4.0–5.6)

## 2023-02-06 LAB — HM DIABETES FOOT EXAM

## 2023-02-06 MED ORDER — DULOXETINE HCL 30 MG PO CPEP: 30.0000 mg | ORAL_CAPSULE | Freq: Every day | ORAL | 3 refills | Status: DC

## 2023-02-06 NOTE — Assessment & Plan Note (Signed)
On valsartan Brief dialysis after MI [facility] nephrology

## 2023-02-06 NOTE — Progress Notes (Signed)
Called patient to schedule an appointment with Al Corpus, PharmD in June or July. No answer; left message.  Al Corpus, PharmD notified  Claudina Lick, [place] Clinical Pharmacy Assistant [phone]

## 2023-02-06 NOTE — Progress Notes (Signed)
Subjective:    Patient ID: [name], male    DOB: July 05, 1951, 72 y.o.   MRN: [id number]  HPI Here for transfer of care  He lives in this area--so transferring for closer care  Major cardiac event MI and had stent Systolic CHF and brief dialysis ~2 years ago  Last GFR 44  Sees Dr Flora Lipps now ---done with CHF clinic Recent cardiac perfusion shows fairly normal EF now Still gets "tiredness" in chest if he does too much--better with rest Does use nitro rarely Breathing is okay Does do exercise regimen--follows from past cardiac rehab and [facility]  Having some right leg pain--started in mid lateral thigh ~2 months ago Didn't limit him Then 3 weeks ago--was going up stairs and it got worse Still hurts going up but not going down [facility]ing the [facility] lately---worsens with more walking Using heating pad Then a week ago--caught foot and fell onto his knees. 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 90 tablet 0   carvedilol (COREG) 3.125 MG tablet TAKE ONE TABLET BY MOUTH EVERY MORNING and TAKE ONE TABLET BY MOUTH EVERY EVENING 180 tablet 0   Cholecalciferol (VITAMIN D) 50 MCG (2000 UT) CAPS Take 1 capsule by mouth daily.     diclofenac Sodium (VOLTAREN) 1 % GEL Apply 2 g topically 2 (two) times daily as needed (pain).     DULoxetine (CYMBALTA) 20 MG capsule TAKE ONE CAPSULE BY MOUTH DAILY 90 capsule 0   ENTRESTO 24-26 MG TAKE ONE TABLET BY MOUTH twice daily 60 tablet 11   FARXIGA 10 MG TABS tablet TAKE ONE TABLET BY MOUTH BEFORE BREAKFAST 90  tablet 3   finasteride ([name]) 5 MG tablet Take 1 tablet (5 mg total) by mouth daily. 90 tablet 3   ketoconazole (NIZORAL) 2 % cream Apply 1 application topically daily as needed for irritation.     LORazepam (ATIVAN) 2 MG tablet Take 1 tablet (2 mg total) by mouth every 6 (six) hours as needed for anxiety. 120 tablet 5   melatonin 3 MG TABS tablet Take 1 tablet by mouth daily.     nitroGLYCERIN (NITROSTAT) 0.4 MG SL tablet Place 1 tablet (0.4 mg total) under the tongue every 5 (five) minutes as needed for chest pain. 50 tablet 3   omeprazole (PRILOSEC) 20 MG capsule TAKE ONE CAPSULE BY MOUTH daily as needed 30 capsule 11   rosuvastatin (CRESTOR) 10 MG tablet TAKE ONE TABLET BY MOUTH ONCE DAILY 90 tablet 11   sildenafil (REVATIO) 20 MG tablet Take 20 mg by mouth as needed.     tiZANidine (ZANAFLEX) 4 MG tablet Take 1 tablet (4 mg total) by mouth every 8 (eight) hours as needed for muscle spasms. 15 tablet 0   triamcinolone cream (KENALOG) 0.1 % Apply 1 application topically 2 (two) times daily as needed (psoriasis).     [DISCONTINUED] tadalafil (CIALIS) 20 MG tablet Take 20 mg by mouth daily as needed.       No current facility-administered medications on file prior to visit.  Allergies  Allergen Reactions   Codeine Other (See Comments)    hallucinations   Lisinopril Cough   Nsaids     Other reaction(s): Unknown    Past Medical History:  Diagnosis Date   Anoxic brain injury (HCC)    Anxiety    Arthritis    Cancer (HCC) 2010   basal cell ca, head   Cataract    bilateral   CHF (congestive heart failure) (HCC)    Coronary artery disease    Depression    ED (erectile dysfunction)    GERD (gastroesophageal reflux disease)    Heart failure (HCC)    Hyperlipidemia    Hypertension    Kidney stone 07/29/2011   passed    Myocardial infarction [facility])    Neuromuscular disorder (HCC)    neuropathy legs   Psoriasis    Vitreous floater    left eye, sees Dr. Oren Bracket at [facility]     Past Surgical History:  Procedure Laterality Date   BASAL CELL CARCINOMA EXCISION     removed from scalp   COLONOSCOPY  07/03/2017   per Dr. Adela Lank, sessile serrated polyp, repeat in 5 yrs    HERNIA REPAIR     LEFT HEART CATH AND CORONARY ANGIOGRAPHY N/A 01/03/2021   Procedure: LEFT HEART CATH AND CORONARY ANGIOGRAPHY;  Surgeon: Lennette Bihari, MD;  [facility];  Service: Cardiovascular;  Laterality: N/A;   RIGHT HEART CATH N/A 01/03/2021   Procedure: RIGHT HEART CATH;  Surgeon: Dolores Patty, MD;  Location: [facility];  Service: Cardiovascular;  Laterality: N/A;    Family History  Problem Relation Age of Onset   Hypertension Mother    Hyperlipidemia Mother    Depression Other    Diabetes Other    Hyperlipidemia Other    Hypertension Other    Alzheimer's disease Other    Colon cancer Neg Hx    Stomach cancer Neg Hx    Rectal cancer Neg Hx    Esophageal cancer Neg Hx     Social History   Socioeconomic History   Marital status: Single    Spouse name: Not on file   Number of children: 0   Years of education: Not on file   Highest education level: Not on file  Occupational History   Occupation: retired    Comment: Runner, broadcasting/film/video   Occupation: Careers adviser - [employer]  Tobacco Use   Smoking status: Never   Smokeless tobacco: Never  Vaping Use   Vaping Use: Never used  Substance and Sexual Activity   Alcohol use: No    Alcohol/week: 0.0 standard drinks of alcohol   Drug use: No   Sexual activity: Not on file  Other Topics Concern   Not on file  Social History Narrative   Lives alone--[place]      Has living will   Health care POA-- friend Trey Paula   Would accept resuscitation--but no prolonged machines or tube feedings   Social Determinants of Health   Financial Resource Strain: Low Risk  (12/15/2022)   Overall Financial Resource Strain (CARDIA)    Difficulty of Paying Living Expenses: Not hard at all  Food Insecurity:  No Food Insecurity (01/05/2023)   Hunger Vital Sign    Worried About Running Out of Food in the Last Year: Never true    Ran Out of Food in the Last Year: Never true  Transportation Needs: No Transportation Needs (12/15/2022)   PRAPARE - Transportation  Lack of Transportation (Medical): No    Lack of Transportation (Non-Medical): No  Physical Activity: Sufficiently Active (12/15/2022)   Exercise Vital Sign    Days of Exercise per Week: 7 days    Minutes of Exercise per Session: 30 min  Stress: No Stress Concern Present (12/15/2022)   [employer] of Occupational Health - Occupational Stress Questionnaire    Feeling of Stress : Not at all  Social Connections: Moderately Isolated (12/15/2022)   Social Connection and Isolation Panel [NHANES]    Frequency of Communication with Friends and Family: More than three times a week    Frequency of Social Gatherings with Friends and Family: More than three times a week    Attends Religious Services: Never    Database administrator or Organizations: Yes    Attends Engineer, structural: More than 4 times per year    Marital Status: Never married  Intimate Partner Violence: Not At Risk (12/15/2022)   Humiliation, Afraid, Rape, and Kick questionnaire    Fear of Current or Ex-Partner: No    Emotionally Abused: No    Physically Abused: No    Sexually Abused: No    Review of Systems Weight stable Sleeps okay Bowels move fine Voids okay---keeps up with Dr Mena Goes    Objective:   Physical Exam Constitutional:      Appearance: Normal appearance.  Cardiovascular:     Rate and Rhythm: Normal rate and regular rhythm.     Pulses: Normal pulses.     Heart sounds: No murmur heard.    No gallop.  Pulmonary:     Effort: Pulmonary effort is normal.     Breath sounds: Normal breath sounds. No wheezing or rales.  Musculoskeletal:     Cervical back: Neck supple.     Comments: No right hip tenderness ROM in hip fairly good   Lymphadenopathy:     Cervical: No cervical adenopathy.  Skin:    Comments: No foot lesions  Neurological:     Mental Status: He is alert.     Comments: Slightly decreased sensation in feet  Psychiatric:        Mood and Affect: Mood normal.        Behavior: Behavior normal.            Assessment & Plan:

## 2023-02-06 NOTE — Assessment & Plan Note (Addendum)
Mild Just on the farxiga 10mg  --mostly for his heart Mild neuropathy as well Will check A1c--- 5.5% No changes needed

## 2023-02-06 NOTE — Assessment & Plan Note (Signed)
Mostly anxiety Will increase duloxetine to 30mg  and have him try to wean the lorazepam---use 1/2 and try to cut frequency Will rehcekc in 3 months

## 2023-02-06 NOTE — Assessment & Plan Note (Signed)
Stable anginal picture On rosuvastatin and rare nitro Entresto and carvedilol as well

## 2023-02-06 NOTE — Assessment & Plan Note (Signed)
Reduced EF after MI Now back in normal range On entresto 24/26 bid---may be able to switch to just valsartan (will leave to cardiology) On carvedilol 3/125 bid as well

## 2023-02-06 NOTE — Assessment & Plan Note (Signed)
Fairly recent No clear bursitis or hip OA Will set up with Dr Corky Sing to use tylenol for now

## 2023-02-11 ENCOUNTER — Encounter: Payer: Self-pay | Admitting: Internal Medicine

## 2023-02-11 DIAGNOSIS — N1832 Chronic kidney disease, stage 3b: Secondary | ICD-10-CM | POA: Diagnosis not present

## 2023-02-12 MED ORDER — LORAZEPAM 2 MG PO TABS: 2.0000 mg | ORAL_TABLET | Freq: Four times a day (QID) | ORAL | 0 refills | Status: DC | PRN

## 2023-02-13 DIAGNOSIS — M1611 Unilateral primary osteoarthritis, right hip: Secondary | ICD-10-CM | POA: Diagnosis not present

## 2023-02-13 DIAGNOSIS — M5416 Radiculopathy, lumbar region: Secondary | ICD-10-CM | POA: Diagnosis not present

## 2023-02-13 DIAGNOSIS — M79651 Pain in right thigh: Secondary | ICD-10-CM | POA: Diagnosis not present

## 2023-02-13 DIAGNOSIS — M545 Low back pain, unspecified: Secondary | ICD-10-CM | POA: Diagnosis not present

## 2023-02-17 DIAGNOSIS — I129 Hypertensive chronic kidney disease with stage 1 through stage 4 chronic kidney disease, or unspecified chronic kidney disease: Secondary | ICD-10-CM | POA: Diagnosis not present

## 2023-02-17 DIAGNOSIS — E1122 Type 2 diabetes mellitus with diabetic chronic kidney disease: Secondary | ICD-10-CM | POA: Diagnosis not present

## 2023-02-17 DIAGNOSIS — N2581 Secondary hyperparathyroidism of renal origin: Secondary | ICD-10-CM | POA: Diagnosis not present

## 2023-02-17 DIAGNOSIS — N179 Acute kidney failure, unspecified: Secondary | ICD-10-CM | POA: Diagnosis not present

## 2023-02-17 DIAGNOSIS — D631 Anemia in chronic kidney disease: Secondary | ICD-10-CM | POA: Diagnosis not present

## 2023-02-17 DIAGNOSIS — N1831 Chronic kidney disease, stage 3a: Secondary | ICD-10-CM | POA: Diagnosis not present

## 2023-02-17 NOTE — Progress Notes (Signed)
Dalton Islam T. Dalton Denman, MD, CAQ [facility] [address] [place], [address]  Phone: [phone]  FAX: [phone]  [name] - 72 y.o. male  MRN [id number]  Date of Birth: Jan 16, 1951  Date: 02/18/2023  PCP: Karie Schwalbe, MD  Referral: Karie Schwalbe, MD  Chief Complaint  Patient presents with   Hip Pain    Right-seen at [facility] last Thursday 5/24   Leg Pain    Right   Subjective:   [name] is a 72 y.o. very pleasant male patient with Body mass index is 24.45 kg/m. who presents with the following:  Patient presents with right-sided leg pain.  He has seen Mrs. Clark and Dr. Alphonsus Sias recently for this 2 weeks ago. He has been having posterior leg pain on the right, and he was recently prescribed some Zanaflex.  He previously has been to physical therapy for his back, and he is well acquainted with home rehab.  X-rays from 2023 showed mod-severe multilevel DDD.  -   For a couple of months has had some R posterior and lateral leg pain and now has some posterior pelvic pain.   Later in April, and went up his stairs and he had to stop.  Going up the stairs really hurt.  Going up one step at a time. Had been working in the back yard planting and digging.   Went to [facility] on Friday. - OA at hip, spine He does have paper copy of the images, and he does have obvious moderate hip osteoarthropathy. - Recommended PT for spine and hip  Has some baseline neuropathy in both feet it is not new  No new numbness, weakness.  Some distal R leg pain  Pred dose PT Zanaflex  Review of Systems is noted in the HPI, as appropriate  Objective:   BP 110/64 (BP Location: Left Arm, Patient Position: Sitting, Cuff Size: Normal)   Pulse 68   Temp (!) 97.5 F (36.4 C) (Temporal)   Ht 6' (1.829 m)   Wt 180 lb 4 oz (81.8 kg)   SpO2 98%   BMI 24.45 kg/m   GEN: No acute distress;  alert,appropriate. PULM: Breathing comfortably in no respiratory distress PSYCH: Normally interactive.    Range of motion at  the waist: Flexion, extension, lateral bending and rotation: Full  No echymosis or edema Rises to examination table with mild difficulty Gait: minimally antalgic  Inspection/Deformity: N Paraspinus Tenderness: L4-S1 bilaterally as well as pain in the upper posterior pelvis  B Ankle Dorsiflexion (L5,4): 5/5 B Great Toe Dorsiflexion (L5,4): 5/5 Heel Walk (L5): WNL Toe Walk (S1): WNL Rise/Squat (L4): WNL, mild pain  SENSORY B Medial Foot (L4): WNL B Dorsum (L5): WNL B Lateral (S1): WNL Light Touch: WNL Pinprick: WNL  REFLEXES Knee (L4): 2+ Ankle (S1): 2+  B SLR, seated: neg B SLR, supine: neg B FABER: Pain B Reverse FABER: Pain B Greater Troch: NT B Log Roll: neg B Sciatic Notch: NT    HIP EXAM: SIDE: Bilateral ROM: Abduction, Flexion, Internal and External range of motion: Left hip has grossly full range of motion, right hip has total rotational range of motion of 20 degrees. Pain with terminal IROM and EROM: Yes GTB: NT SLR: NEG Knees: No effusion FABER: NT REVERSE FABER: NT, neg Piriformis: NT at direct palpation    Laboratory and Imaging Data:  Assessment and Plan:     ICD-10-CM   1. DDD (  degenerative disc disease), lumbosacral  M51.37 Ambulatory referral to Physical Therapy    2. Primary osteoarthritis of right hip  M16.11 Ambulatory referral to Physical Therapy    3. Pain of right lower extremity  M79.604 tiZANidine (ZANAFLEX) 4 MG tablet     Acute on chronic low back pain in the setting of multilevel degenerative disc disease.  Back and posterior pelvis are flared up with referred pain in this region.  He also has significant osteoarthritis of the right hip, though right now I am doubtful this is the primary driver of his pain.  He is going to start formal physical therapy, and I have referred him to the PT office that he  worked with previously.  Refill Zanaflex. Burst of prednisone.  Medication Management during today's office visit: Meds ordered this encounter  Medications   tiZANidine (ZANAFLEX) 4 MG tablet    Sig: Take 1 tablet (4 mg total) by mouth at bedtime.    Dispense:  30 tablet    Refill:  1   predniSONE (DELTASONE) 20 MG tablet    Sig: 2 tabs po daily for 5 days, then 1 tab po daily for 5 days    Dispense:  15 tablet    Refill:  0   Medications Discontinued During This Encounter  Medication Reason   tiZANidine (ZANAFLEX) 4 MG tablet Reorder    Orders placed today for conditions managed today: Orders Placed This Encounter  Procedures   Ambulatory referral to Physical Therapy    Disposition: No follow-ups on file.  Dragon [employer] One speech-to-text software was used for transcription in this dictation.  Possible transcriptional errors can occur using Animal nutritionist.   Signed,  Elpidio Galea. Jaquasia Doscher, MD   Outpatient Encounter Medications as of 02/18/2023  Medication Sig   aspirin 81 MG chewable tablet Chew 1 tablet (81 mg total) by mouth daily.   carvedilol (COREG) 3.125 MG tablet TAKE ONE TABLET BY MOUTH EVERY MORNING and TAKE ONE TABLET BY MOUTH EVERY EVENING   Cholecalciferol (VITAMIN D) 50 MCG (2000 UT) CAPS Take 1 capsule by mouth daily.   diclofenac Sodium (VOLTAREN) 1 % GEL Apply 2 g topically 2 (two) times daily as needed (pain).   DULoxetine (CYMBALTA) 30 MG capsule Take 1 capsule (30 mg total) by mouth daily.   ENTRESTO 24-26 MG TAKE ONE TABLET BY MOUTH twice daily   FARXIGA 10 MG TABS tablet TAKE ONE TABLET BY MOUTH BEFORE BREAKFAST   finasteride ([name]) 5 MG tablet Take 1 tablet (5 mg total) by mouth daily.   ketoconazole (NIZORAL) 2 % cream Apply 1 application topically daily as needed for irritation.   LORazepam (ATIVAN) 2 MG tablet Take 1 tablet (2 mg total) by mouth every 6 (six) hours as needed for anxiety.   melatonin 3 MG TABS tablet Take 1 tablet by mouth  daily.   nitroGLYCERIN (NITROSTAT) 0.4 MG SL tablet Place 1 tablet (0.4 mg total) under the tongue every 5 (five) minutes as needed for chest pain.   omeprazole (PRILOSEC) 20 MG capsule TAKE ONE CAPSULE BY MOUTH daily as needed   predniSONE (DELTASONE) 20 MG tablet 2 tabs po daily for 5 days, then 1 tab po daily for 5 days   rosuvastatin (CRESTOR) 10 MG tablet TAKE ONE TABLET BY MOUTH ONCE DAILY   sildenafil (REVATIO) 20 MG tablet Take 20 mg by mouth as needed.   triamcinolone cream (KENALOG) 0.1 % Apply 1 application topically 2 (two) times daily as needed (psoriasis).   [  DISCONTINUED] tiZANidine (ZANAFLEX) 4 MG tablet Take 1 tablet (4 mg total) by mouth every 8 (eight) hours as needed for muscle spasms.   tiZANidine (ZANAFLEX) 4 MG tablet Take 1 tablet (4 mg total) by mouth at bedtime.   [DISCONTINUED] tadalafil (CIALIS) 20 MG tablet Take 20 mg by mouth daily as needed.     No facility-administered encounter medications on file as of 02/18/2023.

## 2023-02-18 ENCOUNTER — Ambulatory Visit: Payer: Medicare PPO | Admitting: Family Medicine

## 2023-02-18 ENCOUNTER — Encounter: Payer: Self-pay | Admitting: Family Medicine

## 2023-02-18 VITALS — BP 110/64 | HR 68 | Temp 97.5°F | Ht 72.0 in | Wt 180.2 lb

## 2023-02-18 DIAGNOSIS — M5137 Other intervertebral disc degeneration, lumbosacral region: Secondary | ICD-10-CM

## 2023-02-18 DIAGNOSIS — M79604 Pain in right leg: Secondary | ICD-10-CM

## 2023-02-18 DIAGNOSIS — M1611 Unilateral primary osteoarthritis, right hip: Secondary | ICD-10-CM | POA: Diagnosis not present

## 2023-02-18 MED ORDER — PREDNISONE 20 MG PO TABS
ORAL_TABLET | ORAL | 0 refills | Status: DC
Start: 2023-02-18 — End: 2023-05-11

## 2023-02-18 MED ORDER — TIZANIDINE HCL 4 MG PO TABS: 4.0000 mg | ORAL_TABLET | Freq: Every day | ORAL | 1 refills | Status: DC

## 2023-03-06 ENCOUNTER — Other Ambulatory Visit: Payer: Self-pay | Admitting: Family Medicine

## 2023-03-06 DIAGNOSIS — I10 Essential (primary) hypertension: Secondary | ICD-10-CM

## 2023-03-16 NOTE — Progress Notes (Signed)
Cardiology Office Note:   Date:  03/19/2023  NAME:  Favor Hackler    MRN: [id number] DOB:  15-Sep-1951   PCP:  Karie Schwalbe, MD  Cardiologist:  None  Electrophysiologist:  None   Referring MD: Nelwyn Salisbury, MD   Chief Complaint  Patient presents with   Follow-up    History of Present Illness:   Topher Buenaventura is a 72 y.o. male with a hx of CAD, CHF, HLD who presents for follow-up. Reports she is doing well.  Denies any chest pain or trouble breathing.  Blood pressure is stable.  Lipids are at goal.  Is suffering from right hip arthritis.  Working with physical therapy.  We discussed the results of his PET stress test.  This was normal.  Overall doing quite well.  No cardiac complaints today.  Problem List CAD -STEMI/VF Arrest  -PCI RCA 12/2020 -50% pLAD -negative PET MPI 01/28/2023 2. Systolic HF -EF 25-30% 12/2020 -EF 55-60% 06/2022 3. CKD IIIb 4. HLD -T chol 121, HDL 47, LDL 59, TG 70  Past Medical History: Past Medical History:  Diagnosis Date   Anoxic brain injury (HCC)    Anxiety    Arthritis    Cancer (HCC) 2010   basal cell ca, head   Cataract    bilateral   CHF (congestive heart failure) (HCC)    Coronary artery disease    Depression    ED (erectile dysfunction)    GERD (gastroesophageal reflux disease)    Heart failure (HCC)    Hyperlipidemia    Hypertension    Kidney stone 07/29/2011   passed    Myocardial infarction [facility])    Neuromuscular disorder (HCC)    neuropathy legs   Psoriasis    Vitreous floater    left eye, sees Dr. Oren Bracket at [facility]     Past Surgical History: Past Surgical History:  Procedure Laterality Date   BASAL CELL CARCINOMA EXCISION     removed from scalp   COLONOSCOPY  07/03/2017   per Dr. Adela Lank, sessile serrated polyp, repeat in 5 yrs    HERNIA REPAIR     LEFT HEART CATH AND CORONARY ANGIOGRAPHY N/A 01/03/2021   Procedure: LEFT HEART CATH AND CORONARY ANGIOGRAPHY;  Surgeon: Lennette Bihari, MD;   Location: [facility] INVASIVE CV LAB;  Service: Cardiovascular;  Laterality: N/A;   RIGHT HEART CATH N/A 01/03/2021   Procedure: RIGHT HEART CATH;  Surgeon: Dolores Patty, MD;  Location: [facility] INVASIVE CV LAB;  Service: Cardiovascular;  Laterality: N/A;    Current Medications: Current Meds  Medication Sig   aspirin 81 MG chewable tablet Chew 1 tablet (81 mg total) by mouth daily.   carvedilol (COREG) 3.125 MG tablet TAKE ONE TABLET BY MOUTH TWICE DAILY   Cholecalciferol (VITAMIN D) 50 MCG (2000 UT) CAPS Take 1 capsule by mouth daily.   diclofenac Sodium (VOLTAREN) 1 % GEL Apply 2 g topically 2 (two) times daily as needed (pain).   DULoxetine (CYMBALTA) 30 MG capsule Take 1 capsule (30 mg total) by mouth daily.   ENTRESTO 24-26 MG TAKE ONE TABLET BY MOUTH twice daily   FARXIGA 10 MG TABS tablet TAKE ONE TABLET BY MOUTH BEFORE BREAKFAST   finasteride ([name]) 5 MG tablet Take 1 tablet (5 mg total) by mouth daily.   ketoconazole (NIZORAL) 2 % cream Apply 1 application topically daily as needed for irritation.   LORazepam (ATIVAN) 2 MG tablet Take 1 tablet (2 mg total) by mouth every 6 (  six) hours as needed for anxiety.   melatonin 3 MG TABS tablet Take 1 tablet by mouth daily.   nitroGLYCERIN (NITROSTAT) 0.4 MG SL tablet Place 1 tablet (0.4 mg total) under the tongue every 5 (five) minutes as needed for chest pain.   omeprazole (PRILOSEC) 20 MG capsule TAKE ONE CAPSULE BY MOUTH daily as needed   rosuvastatin (CRESTOR) 10 MG tablet TAKE ONE TABLET BY MOUTH ONCE DAILY   sildenafil (REVATIO) 20 MG tablet Take 20 mg by mouth as needed.   tiZANidine (ZANAFLEX) 4 MG tablet Take 1 tablet (4 mg total) by mouth at bedtime.   triamcinolone cream (KENALOG) 0.1 % Apply 1 application topically 2 (two) times daily as needed (psoriasis).     Allergies:    Codeine, Lisinopril, and Nsaids   Social History: Social History   Socioeconomic History   Marital status: Single    Spouse name: Not on file   Number  of children: 0   Years of education: Not on file   Highest education level: Master's degree (e.g., MA, MS, MEng, MEd, MSW, MBA)  Occupational History   Occupation: retired    Comment: Runner, broadcasting/film/video   Occupation: Careers adviser - [employer]  Tobacco Use   Smoking status: Never   Smokeless tobacco: Never  Vaping Use   Vaping Use: Never used  Substance and Sexual Activity   Alcohol use: No    Alcohol/week: 0.0 standard drinks of alcohol   Drug use: No   Sexual activity: Not on file  Other Topics Concern   Not on file  Social History Narrative   Lives alone--[place]      Has living will   Health care POA-- friend Trey Paula   Would accept resuscitation--but no prolonged machines or tube feedings   Social Determinants of Health   Financial Resource Strain: Low Risk  (02/15/2023)   Overall Financial Resource Strain (CARDIA)    Difficulty of Paying Living Expenses: Not hard at all  Food Insecurity: No Food Insecurity (02/15/2023)   Hunger Vital Sign    Worried About Running Out of Food in the Last Year: Never true    Ran Out of Food in the Last Year: Never true  Transportation Needs: No Transportation Needs (02/15/2023)   PRAPARE - Administrator, Civil Service (Medical): No    Lack of Transportation (Non-Medical): No  Physical Activity: Sufficiently Active (02/15/2023)   Exercise Vital Sign    Days of Exercise per Week: 7 days    Minutes of Exercise per Session: 30 min  Stress: Stress Concern Present (02/15/2023)   [employer] of Occupational Health - Occupational Stress Questionnaire    Feeling of Stress : To some extent  Social Connections: Unknown (02/15/2023)   Social Connection and Isolation Panel [NHANES]    Frequency of Communication with Friends and Family: More than three times a week    Frequency of Social Gatherings with Friends and Family: Twice a week    Attends Religious Services: Patient declined    Database administrator or Organizations: Yes    Attends  Engineer, structural: More than 4 times per year    Marital Status: Never married  Recent Concern: Social Connections - Moderately Isolated (12/15/2022)   Social Connection and Isolation Panel [NHANES]    Frequency of Communication with Friends and Family: More than three times a week    Frequency of Social Gatherings with Friends and Family: More than three times a week  Attends Religious Services: Never    Active Member of Clubs or Organizations: Yes    Attends Engineer, structural: More than 4 times per year    Marital Status: Never married     Family History: The patient's family history includes Alzheimer's disease in an other family member; Depression in an other family member; Diabetes in an other family member; Hyperlipidemia in his mother and another family member; Hypertension in his mother and another family member. There is no history of Colon cancer, Stomach cancer, Rectal cancer, or Esophageal cancer.  ROS:   All other ROS reviewed and negative. Pertinent positives noted in the HPI.     EKGs/Labs/Other Studies Reviewed:   The following studies were personally reviewed by me today:  EKG:  EKG is not ordered today.        Recent Labs: 11/26/2022: ALT 24; BUN 34; Creatinine, Ser 1.53; Hemoglobin 15.7; Platelets 162; Potassium 4.7; Sodium 138; TSH 1.390   Recent Lipid Panel    Component Value Date/Time   CHOL 121 06/11/2022 0848   CHOL 139 11/16/2019 0859   TRIG 70.0 06/11/2022 0848   HDL 47.70 06/11/2022 0848   HDL 60 11/16/2019 0859   CHOLHDL 3 06/11/2022 0848   VLDL 14.0 06/11/2022 0848   LDLCALC 59 06/11/2022 0848   LDLCALC 71 05/24/2020 0829   LDLDIRECT 141.9 04/06/2013 0843    Physical Exam:   VS:  BP 139/83 (BP Location: Right Arm, Patient Position: Sitting, Cuff Size: Normal)   Pulse 62   Ht 6\' 1"  (1.854 m)   Wt 181 lb (82.1 kg)   SpO2 99%   BMI 23.88 kg/m    Wt Readings from Last 3 Encounters:  03/19/23 181 lb (82.1 kg)   02/18/23 180 lb 4 oz (81.8 kg)  02/06/23 180 lb 12.8 oz (82 kg)    General: Well nourished, well developed, in no acute distress Head: Atraumatic, normal size  Eyes: PEERLA, EOMI  Neck: Supple, no JVD Endocrine: No thryomegaly Cardiac: Normal S1, S2; RRR; no murmurs, rubs, or gallops Lungs: Clear to auscultation bilaterally, no wheezing, rhonchi or rales  Abd: Soft, nontender, no hepatomegaly  Ext: No edema, pulses 2+ Musculoskeletal: No deformities, BUE and BLE strength normal and equal Skin: Warm and dry, no rashes   Neuro: Alert and oriented to person, place, time, and situation, CNII-XII grossly intact, no focal deficits  Psych: Normal mood and affect   ASSESSMENT:   Merrill Villarruel is a 72 y.o. male who presents for the following: 1. Coronary artery disease of native artery of native heart with stable angina pectoris (HCC)   2. Hyperlipemia, mixed   3. Chronic systolic heart failure (HCC)     PLAN:   1. Coronary artery disease of native artery of native heart with stable angina pectoris (HCC) 2. Hyperlipemia, mixed 3. Chronic systolic heart failure (HCC) -Inferior STEMI complicated by VF arrest in April 2022.  PCI to RCA.  He does have residual disease in the proximal LAD.  Was having symptoms of shortness of breath.  Cardiac PET stress test was negative.  No ischemia.  EF has recovered.  Overall doing well without complaints of angina.  LDL at goal.  He will continue Crestor 10 mg daily.  He is on Coreg 3.25 mg twice daily, Entresto 24-26 mg twice daily, Farxiga 10 mg daily.  No signs of heart failure.  Overall doing well.  He will see [place] yearly.  Disposition: Return in about 1 year (around 03/18/2024).  Medication Adjustments/Labs and Tests Ordered: Current medicines are reviewed at length with the patient today.  Concerns regarding medicines are outlined above.  No orders of the defined types were placed in this encounter.  No orders of the defined types were placed in  this encounter.  Patient Instructions  Medication Instructions:  No changes *If you need a refill on your cardiac medications before your next appointment, please call your pharmacy*  Follow-Up: At [facility], you and your health needs are our priority.  As part of our continuing mission to provide you with exceptional heart care, we have created designated Provider Care Teams.  These Care Teams include your primary Cardiologist (physician) and Advanced Practice Providers (APPs -  Physician Assistants and Nurse Practitioners) who all work together to provide you with the care you need, when you need it.  We recommend signing up for the patient portal called "MyChart".  Sign up information is provided on this After Visit Summary.  MyChart is used to connect with patients for Virtual Visits (Telemedicine).  Patients are able to view lab/test results, encounter notes, upcoming appointments, etc.  Non-urgent messages can be sent to your provider as well.   To learn more about what you can do with MyChart, go to [email or web address].    Your next appointment:   1 year(s)  Provider:   Dr Flora Lipps    Time Spent with Patient: I have spent a total of 25 minutes with patient reviewing hospital notes, telemetry, EKGs, labs and examining the patient as well as establishing an assessment and plan that was discussed with the patient.  > 50% of time was spent in direct patient care.  Signed, Lenna Gilford. Flora Lipps, MD, [facility]  [facility]  [address] [place], [place] [address] [phone]  03/19/2023 1:30 PM

## 2023-03-19 ENCOUNTER — Encounter: Payer: Self-pay | Admitting: Cardiovascular Disease

## 2023-03-19 ENCOUNTER — Ambulatory Visit: Payer: Medicare PPO | Attending: Cardiovascular Disease | Admitting: Cardiovascular Disease

## 2023-03-19 VITALS — BP 139/83 | HR 62 | Ht 73.0 in | Wt 181.0 lb

## 2023-03-19 DIAGNOSIS — I5022 Chronic systolic (congestive) heart failure: Secondary | ICD-10-CM | POA: Diagnosis not present

## 2023-03-19 DIAGNOSIS — E782 Mixed hyperlipidemia: Secondary | ICD-10-CM

## 2023-03-19 DIAGNOSIS — I25118 Atherosclerotic heart disease of native coronary artery with other forms of angina pectoris: Secondary | ICD-10-CM | POA: Diagnosis not present

## 2023-03-19 NOTE — Patient Instructions (Signed)
Medication Instructions:  No changes *If you need a refill on your cardiac medications before your next appointment, please call your pharmacy*  Follow-Up: At [facility], you and your health needs are our priority.  As part of our continuing mission to provide you with exceptional heart care, we have created designated Provider Care Teams.  These Care Teams include your primary Cardiologist (physician) and Advanced Practice Providers (APPs -  Physician Assistants and Nurse Practitioners) who all work together to provide you with the care you need, when you need it.  We recommend signing up for the patient portal called "MyChart".  Sign up information is provided on this After Visit Summary.  MyChart is used to connect with patients for Virtual Visits (Telemedicine).  Patients are able to view lab/test results, encounter notes, upcoming appointments, etc.  Non-urgent messages can be sent to your provider as well.   To learn more about what you can do with MyChart, go to [email or web address].    Your next appointment:   1 year(s)  Provider:   Dr O'Neal   

## 2023-03-23 ENCOUNTER — Other Ambulatory Visit: Payer: Self-pay | Admitting: Internal Medicine

## 2023-03-24 NOTE — Telephone Encounter (Signed)
Per MyChart message from pt: Hello Dr. Alphonsus Sias, Per instructions from my first visit with you I am requesting a refill for my ativan. I am taking only 3 tablets a day now.  My plan is during July to cut back to 2 1/2 tablets. I run out on Wednesday 03/25/23.  This prescription goes to [facility] on [address] in [place]. Thank you very much.  Cleta Alberts

## 2023-03-24 NOTE — Telephone Encounter (Signed)
Last filled 02-12-23 #120 Last OV 02-18-23 Next OV 05-11-23 Karin Golden

## 2023-03-25 ENCOUNTER — Ambulatory Visit: Payer: Medicare PPO | Attending: Family Medicine

## 2023-03-25 DIAGNOSIS — M5459 Other low back pain: Secondary | ICD-10-CM | POA: Insufficient documentation

## 2023-03-25 DIAGNOSIS — M5137 Other intervertebral disc degeneration, lumbosacral region: Secondary | ICD-10-CM | POA: Insufficient documentation

## 2023-03-25 DIAGNOSIS — M1611 Unilateral primary osteoarthritis, right hip: Secondary | ICD-10-CM | POA: Insufficient documentation

## 2023-03-25 DIAGNOSIS — R262 Difficulty in walking, not elsewhere classified: Secondary | ICD-10-CM | POA: Diagnosis not present

## 2023-03-25 DIAGNOSIS — M6281 Muscle weakness (generalized): Secondary | ICD-10-CM | POA: Insufficient documentation

## 2023-03-25 DIAGNOSIS — M25551 Pain in right hip: Secondary | ICD-10-CM | POA: Insufficient documentation

## 2023-03-27 NOTE — Therapy (Signed)
[facility] [address], [place], [address] Phone: [phone]   Fax:  [phone]  Outpatient Physical Therapy Evaluation  Patient Details  Name: [name] MRN: [id number] Date of Birth: [birth year] Referring Provider (PT): Hannah Beat, MD   Encounter Date: 03/25/2023    03/25/23 1640  PT Visits / Re-Eval  Visit Number 1  Number of Visits 12  Date for PT Re-Evaluation 05/06/23  Authorization  Authorization Type [employer] PPO  Authorization Time Period 03/25/23-05/06/23  Progress Note Due on Visit 10  PT Time Calculation  PT Start Time 1115  PT Stop Time 1200  PT Time Calculation (min) 45 min  PT - End of Session  Activity Tolerance Patient tolerated treatment well;No increased pain  Behavior During Therapy WFL for tasks assessed/performed     Past Medical History:  Diagnosis Date   Anoxic brain injury (HCC)    Anxiety    Arthritis    Cancer (HCC) 2010   basal cell ca, head   Cataract    bilateral   CHF (congestive heart failure) (HCC)    Coronary artery disease    Depression    ED (erectile dysfunction)    GERD (gastroesophageal reflux disease)    Heart failure (HCC)    Hyperlipidemia    Hypertension    Kidney stone 07/29/2011   passed    Myocardial infarction [facility])    Neuromuscular disorder (HCC)    neuropathy legs   Psoriasis    Vitreous floater    left eye, sees Dr. Oren Bracket at [facility]     Past Surgical History:  Procedure Laterality Date   BASAL CELL CARCINOMA EXCISION     removed from scalp   COLONOSCOPY  07/03/2017   per Dr. Adela Lank, sessile serrated polyp, repeat in 5 yrs    HERNIA REPAIR     LEFT HEART CATH AND CORONARY ANGIOGRAPHY N/A 01/03/2021   Procedure: LEFT HEART CATH AND CORONARY ANGIOGRAPHY;  Surgeon: Lennette Bihari, MD;  [facility]: [facility];  Service: Cardiovascular;  Laterality: N/A;   RIGHT HEART CATH N/A 01/03/2021    Procedure: RIGHT HEART CATH;  Surgeon: Dolores Patty, MD;  Location: [facility];  Service: Cardiovascular;  Laterality: N/A;    There were no vitals filed for this visit.       Symptoms/Limitations  Subjective Pr sent here for PT for Rt hip arthritis. Has been seen by orthopedics and sports medicine.  Pertinent History [name] is a 71yoM who started having acute Rt hip pain in March 2024 after aggressive use of Rt leg to stomp on a diggin' shovel. Inititally pt had quite of bit of severe pain, mostly posterior Right hip, limited tolerance to walking, standing. Pt saw orthopedics and later sports med, imaging revealing of Rt hip OA, placed on prednisone taper, other analgesics and still taking scheduled tylenol arthritis as of 03/25/23. Pain is currently 2-3/10 which is common, but can be as high as 4-5/10 at worse over the most recent week. Pt is known to our services from PT for chornic low back pain. Pt sustained 1 fall in June whiles upstepping a sidewalk curb, caught his Right toes and fell into a modified quadruped posture, some subsequent exacerbation of Rt hip pain, but within tolerable levels. No other recent falls or close calls. This episode of Rt hip pain is far more painful than his typical 20 year history of low back pain, but  he has had no prior episodes of rght hip pain. Pt cannot take NSAID medications due to renal status.  Diagnostic tests xray of hip  Patient Stated Goals resolve this hip pain, return to walking and physical activity as lib  Pain Assessment  Currently in Pain? Yes  Pain Score 2  Pain Location Hip  Pain Orientation Right;Posterior  Pain Descriptors / Indicators Aching  Pain Type Acute pain      03/25/23 0001  Assessment  Medical Diagnosis Subacute Right hip pain  Referring Provider (PT) Hannah Beat, MD  Onset Date/Surgical Date  (March 2024)  Hand Dominance Right  Prior Therapy Here, recently for chronic low back pain  Precautions   Precautions Back (renal status/medications)  Restrictions  Weight Bearing Restrictions No  Balance Screen  Has the patient fallen in the past 6 months Yes  How many times? 1  Has the patient had a decrease in activity level because of a fear of falling?  No  Is the patient reluctant to leave their home because of a fear of falling?  No  Home Environment  Living Environment Private residence  Type of Home  (multilevel townhouse, bed and bath on main level, 1 parial step at entry in garage, managed just fine)  Prior Function  Level of Independence Independent  Vocation Retired  Higher education careers adviser  Leisure walking dog  Observation/Other Assessments  Focus on Therapeutic Outcomes (FOTO)   (defer to 2nd visit, originally set up for wrong body part)  Transfers  Five time sit to stand comments  26.11sec (hands-free, can feel but no post exacerbation)  Ambulation/Gait  Ambulation Distance (Feet) 700 Feet (521ft for limited community distance demo, then asked for 216ft more due to onset of antalgia/asymetry after 436ft.)  Assistive device None  Gait Pattern  (abducted RLE slightly after 464ft, mostly very symetrical)  Gait velocity 1.80m/s  Gait Comments no pain exacerbation  Balance  Balance Assessed Yes  Static Standing Balance  Static Standing Balance -  Activities  Single Leg Stance - Right Leg;Single Leg Stance - Left Leg  Static Standing - Comment/# of Minutes Left 12, 9, 14; (Rt: 8, 9, 8 (pressure, mildly weaker, pain ok))    BLE Strength Assessment  03/25/23     Right  Left  Hip Flexion 5/5 5/5  Hip Horizontal ABDCT  5/5 5/5  Hip Horizontal ADD 5/5 5/5  Hip IR (seated)  5/5 5/5  Hip ER (seated) 5/5 5/5  Knee Extension 5/5 5/5  Knee Flexion (seated) 5/5 5/5  Hip ABDCT (supine)  4/5 4/5  Hip Extension (Supine @ 30 degrees) deferred for time deferred for time  Hip Extension (Prone SLR) deferred for time deferred for time  Hip Extension (Prone BLR)  deferred for time deferred for time      P/ROM Assessment Hip 03/25/23     Right  Left   Flexion Supine  94 degrees deferred for time  Flexion Supine (in slight scaption) 104 degrees deferred for time  External rotation (90/90) 54 degrees deferred for time  Internal Rotation (90/90) 6 degrees*  deferred for time  Supine Hip ABDCT 26 degrees deferred for time  Prone Hip Extension  deferred for time deferred for time          *pelvis hypermobility during ranging, assessment taken in relation to bilat ASIS/pelvis alignment     Objective measurements completed on examination: See above findings.        PT Short Term Goals -  PT SHORT TERM GOAL #1   Title Pt to report resuming dog walks ad lib 3-4x weekly without exacerbation symptoms in Right hip.    Baseline eval: has not returned to PLOF out of caution    Time 4    Period Weeks    Status New    Target Date 04/24/23      PT SHORT TERM GOAL #2   Title Pt to report reduced intensity and improved consistentcy in Rt hip symptoms // to transition from scheduled tylenol use to PRN tylenol use.    Baseline eval: taking tylenol arthritis scheduled 3x daily.    Time 4    Period Weeks    Status New    Target Date 04/24/23               PT Long Term Goals -       PT LONG TERM GOAL #1   Title Pt to improve FOTO score >8 points to indiciated reduced level of difficulty in performance of ADL adn IADL.    Baseline visit 2 value pending:    Time 8    Period Weeks    Status New    Target Date 05/22/23      PT LONG TERM GOAL #2   Title Pt to demonstrate performance without exacerbation of Rt hip pain, no greater LBP exacerbation than 2 points on NPRS.    Baseline eval: has mild back pain increase and onset antalgia after 456ft during walking assessment    Time 8    Period Weeks    Status New    Target Date 05/22/23      PT LONG TERM GOAL #3   Title Pt to report back to baseline in terms of ADL, leisure, and  fitness activity without any remaining Rt hip symptoms or concerns regarding potential exacerbation.    Time 8    Period Weeks    Status New    Target Date 05/22/23             PT LONG TERM GOAL #4   Title Pt to demonstrate SLS >30sec bilat to serve as proxy of improved proprioception and motor control in bilat femoroacetabluar joints.    Baseline eval: <15 sec bilat, Rt side performance 25% worse than Left   Time 8    Period Weeks    Status New    Target Date 05/22/23       03/25/23 1641  Plan  Clinical Impression Statement Finding unreamrkable for significant impairment in RLE, mostly mild loss of global joint moiblity. Pt still taking pain meds scheduled. His performance of stair climbing remains modified. AMB does become less comfortable after 470ft with onset antalgia. A skilled PT intervention would maximize potential for improvements in areas of impairment as seen in exam which would result in acheiving goals of care.  Personal Factors and Comorbidities Age;Fitness;Past/Current Experience  Examination-Activity Limitations Locomotion Level;Transfers;Dressing;Stand  Examination-Participation Restrictions Community Activity;Church;Meal Prep;Yard Work;Laundry  Pt will benefit from skilled therapeutic intervention in order to improve on the following deficits Abnormal gait;Decreased range of motion;Difficulty walking;Decreased activity tolerance;Decreased balance;Decreased strength  Stability/Clinical Decision Making Stable/Uncomplicated  Clinical Decision Making Moderate  Rehab Potential Excellent  PT Frequency 2x / week  PT Duration 6 weeks  PT Treatment/Interventions ADLs/Self Care Home Management;Electrical Stimulation  PT Next Visit Plan Screen pelvis, screen lumbar, did patient FOTO at home? 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) 09/03/2021   Type 2 diabetes mellitus with diabetic chronic kidney disease (HCC) 05/30/2021   Stage 3b chronic kidney disease (HCC) 05/30/2021   Coronary artery calcification seen on CAT scan 06/15/2019   Nodule of lower lobe of left lung 06/15/2019   Tremor of right hand 04/24/2017   Visual floaters 04/04/2013   Subjective vision disturbance, left 04/04/2013   BPH with urinary obstruction 01/01/2010   MELANOMA, HX OF 06/01/2009   Hyperlipemia, mixed 04/12/2007   Mood disorder (HCC) 04/12/2007   Essential hypertension 04/12/2007   GERD 04/12/2007   10:06 AM, 03/27/23 Rosamaria Lints, PT, DPT Physical Therapist - [place] [phone] (Office)   [place] C, PT 03/27/2023, 9:50 AM  [facility] [address], [place], [address] Phone: [phone]   Fax:  [phone]  Name: Sosaia Beales MRN: [id number] Date of Birth: [birth year]

## 2023-04-01 ENCOUNTER — Ambulatory Visit: Payer: Medicare PPO | Admitting: Physical Therapy

## 2023-04-01 ENCOUNTER — Encounter: Payer: Self-pay | Admitting: Physical Therapy

## 2023-04-01 DIAGNOSIS — M5459 Other low back pain: Secondary | ICD-10-CM | POA: Diagnosis not present

## 2023-04-01 DIAGNOSIS — M25551 Pain in right hip: Secondary | ICD-10-CM | POA: Diagnosis not present

## 2023-04-01 DIAGNOSIS — R262 Difficulty in walking, not elsewhere classified: Secondary | ICD-10-CM

## 2023-04-01 DIAGNOSIS — M6281 Muscle weakness (generalized): Secondary | ICD-10-CM | POA: Diagnosis not present

## 2023-04-01 DIAGNOSIS — M5137 Other intervertebral disc degeneration, lumbosacral region: Secondary | ICD-10-CM | POA: Diagnosis not present

## 2023-04-01 DIAGNOSIS — M1611 Unilateral primary osteoarthritis, right hip: Secondary | ICD-10-CM | POA: Diagnosis not present

## 2023-04-01 NOTE — Therapy (Signed)
OUTPATIENT PHYSICAL THERAPY TREATMENT NOTE   Patient Name: Aws Shere MRN: [id number] DOB:April 10, 1951, 72 y.o., male Today's Date: 04/01/2023  PCP: Karie Schwalbe, MD REFERRING PROVIDER: Hannah Beat, MD  END OF SESSION:  PT End of Session - 04/01/23 1117     Visit Number 2    Number of Visits 12    Date for PT Re-Evaluation 05/06/23    Authorization Type [employer] PPO reporting period from 03/25/2023    Authorization Time Period auth 22 PT visits  7/3 - 9/13  Authorization #[id number]    Authorization - Visit Number 2    Authorization - Number of Visits 22    Progress Note Due on Visit 10    PT Start Time 1032    PT Stop Time 1118    PT Time Calculation (min) 46 min    Activity Tolerance Patient tolerated treatment well;No increased pain    Behavior During Therapy WFL for tasks assessed/performed             Past Medical History:  Diagnosis Date   Anoxic brain injury (HCC)    Anxiety    Arthritis    Cancer (HCC) 2010   basal cell ca, head   Cataract    bilateral   CHF (congestive heart failure) (HCC)    Coronary artery disease    Depression    ED (erectile dysfunction)    GERD (gastroesophageal reflux disease)    Heart failure (HCC)    Hyperlipidemia    Hypertension    Kidney stone 07/29/2011   passed    Myocardial infarction [facility])    Neuromuscular disorder (HCC)    neuropathy legs   Psoriasis    Vitreous floater    left eye, sees Dr. Oren Bracket at [facility]    Past Surgical History:  Procedure Laterality Date   BASAL CELL CARCINOMA EXCISION     removed from scalp   COLONOSCOPY  07/03/2017   per Dr. 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) 09/03/2021   Type 2 diabetes mellitus with diabetic chronic kidney disease (HCC) 05/30/2021   Stage 3b chronic kidney disease (HCC) 05/30/2021   Coronary artery calcification seen on CAT scan 06/15/2019   Nodule of lower lobe of left lung 06/15/2019   Tremor of right hand 04/24/2017   Visual floaters 04/04/2013   Subjective vision disturbance, left 04/04/2013   BPH with urinary obstruction 01/01/2010   MELANOMA, HX OF 06/01/2009   Hyperlipemia, mixed 04/12/2007   Mood disorder (HCC) 04/12/2007   Essential hypertension 04/12/2007   GERD 04/12/2007    REFERRING DIAG: lumbosacral DDD (degenerative disc disease), Primary osteoarthritis of right hip  THERAPY DIAG:  Pain in right hip  Difficulty in walking, not elsewhere classified  Rationale for Evaluation and Treatment: Rehabilitation  PERTINENT HISTORY: Fabien Travelstead is a 71yoM  who started having acute Rt hip pain in March 2024 after aggressive use of Rt leg to stomp on a diggin' shovel. Inititally pt had quite of bit of severe pain, mostly posterior Right hip, limited tolerance to walking, standing. Pt saw orthopedics and later sports med, imaging revealing of Rt hip OA, placed on prednisone taper, other analgesics and still taking scheduled tylenol arthritis as of 03/25/23. Pain is currently 2-3/10 which is common, but can be as high as 4-5/10 at worse over the most recent week. Pt is known to our services from PT for chornic low back pain. Pt sustained 1 fall in June whiles upstepping a sidewalk curb, caught his Right toes and fell into a modified quadruped posture, some subsequent  exacerbation of Rt hip pain, but within tolerable levels. No other recent falls or close calls. This episode of Rt hip pain is far more painful than his typical 20 year history of low back pain, but he has had no prior episodes of rght hip pain. Pt cannot take NSAID medications due to renal status.   PRECAUTIONS: Back (renal status/medications)  SUBJECTIVE:   SUBJECTIVE STATEMENT: Patient states his pain comes and goes and is better or worse. He does not say he was particularly sore after initial PT session last week. He states his condition started getting worse in late march or around the first of April. It was a nagging pain in his lateral right thigh and it didn't keep him from doing anything. Then at the end of April he started going up steps after a few steps it became intolerable. It kept getting worse. He saw his now PCP who had some thoughts and recommended he see Dr. Patsy Lager. The pian kept getting worse before his appointment in 2 weeks. Walking got so painful that he started sweating and feeling nausea. He went to [facility] who took exrays of his right hip. The xrays of his right hip showed moderate arthritis there. [facility] suggested PT. When he went to Dr. Patsy Lager a week later he said the same thing, so he came back to PT here. [facility] gave him a muscle relaxer which made him feel terrible and loopy. He now takes a half of one in the evenings. Dr. Patsy Lager gave him a 10 day dosage of prednisone, which helped. In the meantime he saw a kidney specialist and asked him if there is any arthritis medication he can take and he said he could take Tylenol Arthritis. His hip is better. Last week he did walk up the stairs with minimal of pain. The pain never got past the knee. HE cannot remember any numbness or tingling. When it was the worst it was just the pin and the right lower back.   PAIN:  Are you having pain?  NPRS: 3-4/10 right lower back and down the the posterior right  hip  OBJECTIVE  SELF-REPORTED FUNCTION FOTO score: 61/100 (hip questionnaire)  SPINE MOTION LUMBAR SPINE AROM *Indicates pain Flexion: fingers to mid tibia, normal ROM for him; OP increased central low back pain Extension: 0%; OP increased central low back pain.  Side Flexion:   R fingers 2 inches from proximal patella, increased central low back pain; OP increased central low back pain  L fingers 3 inches from proximal patella; OP increased central low back pain Rotation:  R 25%; OP increased central low back pain L 25%: OP increased central low back pain  SPECIAL TESTS:  LOWER LIMB NEURODYNAMIC TESTS Straight Leg Raise (Sciatic nerve)  R  = negative  L  = negative  SIJ SPECIAL TESTS SIJ distraction: negative SIJ compression: Negative Thigh thrust: R = reproduction of right sided pain at posterior hip , L = negative. Sacral thrust: Negative  ACCESSORY MOTION: CPA in lower segments of lumbar region reproduced localized central lumbar pain, worst at L5. No radiation to R hip.  R hip LAD in open pack position, no pain  PALPATION: TTP with concordant pain at right posterior hip muscles, not at greater trochanter or glute med.  Mild tenderness at L SIJ, not at right SIJ.   TODAY'S TREATMENT: Therapeutic exercise: to centralize symptoms and improve ROM, strength, muscular endurance, and activity tolerance required for successful completion of functional activities.  - further exam (see above) - hooklying hip abduction against RTB, 1x10 with 5 second hold - hooklying bridge on heels, 1x10 with 5 second holds - Education on diagnosis, prognosis, POC, anatomy and physiology of current condition.  - Education on HEP including handout   Pt required multimodal cuing for proper technique and to facilitate improved neuromuscular control, strength, range of motion, and functional ability resulting in improved performance and form.  Manual therapy: to reduce pain and tissue  tension, improve range of motion, neuromodulation, in order to promote improved ability to complete functional activities. - R hip LAD 3x45 seconds in open pack position with belt  PATIENT EDUCATION: Education details: Exercise purpose/form. Self management techniques. Education on diagnosis, prognosis, POC, anatomy and physiology of current condition Education on HEP including handout  Person educated: Patient Education method: Explanation, Demonstration, Tactile cues, Verbal cues, and Handouts Education comprehension: verbalized understanding, returned demonstration, and needs further education  HOME EXERCISE PROGRAM: Access Code: [email or web address] URL: [email or web address] Date: 04/01/2023 Prepared by: Norton Blizzard  Exercises - Hooklying Clamshell with Resistance  - 1-2 x daily - 2 sets - 10 reps - 5 seconds hold - Bridge on Heels  - 1-2 x daily - 2 sets - 10 reps - 5 seconds hold  ASSESSMENT:         Clinical Impression Statement Patient arrives reporting good tolerance to initial evaluation. Today's session focused on further exam of lumbar spine and SIJ. Patient's concordant  pain could not be definititely connected to low back or SIJ, which increases likelihood his pain is from his right hip. The only positive test for SIJ was thigh thrust on the right, which also can be positive in the presence of posterior hip pain. Patient appeared to benefit from LAD and gentle strengthening exercises, reporting improved concordant pain by end of session. Initial HEP was provided with patient demonstrating good ability to perform after initial instruction. Plan to progress with strengthening and mobilizations as tolerated next session. Patient would benefit from continued management of limiting condition by skilled physical therapist to address remaining impairments and functional limitations to work towards stated goals and return to PLOF or maximal functional independence.     Personal  Factors and Comorbidities Age;Fitness;Past/Current Experience    Examination-Activity Limitations Locomotion Level;Transfers;Dressing;Stand    Examination-Participation Restrictions Community Activity;Church;Meal Prep;Pincus Badder [facility]    Stability/Clinical Decision Making Stable/Uncomplicated    Rehab Potential Excellent    PT Frequency 2x / week    PT Duration 6 weeks    PT Treatment/Interventions ADLs/Self Care Home Management;Electrical Stimulation    PT Next Visit Plan Progressive hip/core/functional strengthening and ROM exercises as tolerated. Education. Manual therapy and/or dry needling as needed.    Consulted and Agree with Plan of Care Patient  PT Short Term Goals   Target date for all short term goals: 04/24/2023    PT SHORT TERM GOAL #1   Title Pt to report resuming dog walks ad lib 3-4x weekly without exacerbation symptoms in Right hip.    Baseline eval: has not returned to PLOF out of caution (03/25/2023)   Time 4    Period Weeks    Status In-progress   Target Date 04/24/23      PT SHORT TERM GOAL #2   Title Pt to report reduced intensity and improved consistentcy in Rt hip symptoms // to transition from scheduled tylenol use to PRN tylenol use.    Baseline eval: taking tylenol arthritis scheduled 3x daily. (03/25/2023);    Time 4    Period Weeks    Status  In-progress   Target Date 04/24/23              PT Long Term Goals:  Target date for all long term goals: 05/22/2023    PT LONG TERM GOAL #1   Title Pt to improve FOTO score >8 points to indiciated reduced level of difficulty in performance of ADL adn IADL.    Baseline visit 2 value pending: 61 (04/01/2023);    Time 8    Period  Weeks   Status In-Progress   Target Date 05/22/23      PT LONG TERM GOAL #2   Title Pt to demonstrate performance without exacerbation of Rt hip pain, no greater LBP exacerbation than 2 points on NPRS.    Baseline eval: has mild back pain increase and onset  antalgia after 456ft during walking assessment (03/25/2023);    Time 8    Period Weeks    Status In- progress   Target Date 05/22/23      PT LONG TERM GOAL #3   Title Pt to report back to baseline in terms of ADL, leisure, and fitness activity without any remaining Rt hip symptoms or concerns regarding potential exacerbation. (03/25/2023);    Time 8    Period Weeks    Status In-progress   Target Date 05/22/23              Cira Rue, PT, DPT 04/01/2023, 12:03 PM  [facility] [address] [place], [place] [address] P: [phone] I F: [phone]

## 2023-04-06 ENCOUNTER — Ambulatory Visit: Payer: Medicare PPO | Admitting: Physical Therapy

## 2023-04-06 ENCOUNTER — Encounter: Payer: Self-pay | Admitting: Physical Therapy

## 2023-04-06 VITALS — BP 130/70 | HR 65

## 2023-04-06 DIAGNOSIS — M5137 Other intervertebral disc degeneration, lumbosacral region: Secondary | ICD-10-CM | POA: Diagnosis not present

## 2023-04-06 DIAGNOSIS — R262 Difficulty in walking, not elsewhere classified: Secondary | ICD-10-CM | POA: Diagnosis not present

## 2023-04-06 DIAGNOSIS — M5459 Other low back pain: Secondary | ICD-10-CM | POA: Diagnosis not present

## 2023-04-06 DIAGNOSIS — M6281 Muscle weakness (generalized): Secondary | ICD-10-CM | POA: Diagnosis not present

## 2023-04-06 DIAGNOSIS — M25551 Pain in right hip: Secondary | ICD-10-CM | POA: Diagnosis not present

## 2023-04-06 DIAGNOSIS — M1611 Unilateral primary osteoarthritis, right hip: Secondary | ICD-10-CM | POA: Diagnosis not present

## 2023-04-06 NOTE — Therapy (Signed)
OUTPATIENT PHYSICAL THERAPY TREATMENT NOTE   Patient Name: [name] MRN: [id number] DOB:[birth year], 72 y.o., male Today's Date: 04/06/2023  PCP: Dalton Schwalbe, MD REFERRING PROVIDER: Hannah Beat, MD  END OF SESSION:  PT End of Session - 04/06/23 1135     Visit Number 3    Number of Visits 12    Date for PT Re-Evaluation 05/06/23    Authorization Type [employer] PPO reporting period from 03/25/2023    Authorization Time Period auth 22 PT visits  7/3 - 9/13  Authorization #[id number]    Authorization - Visit Number 3    Authorization - Number of Visits 22    Progress Note Due on Visit 10    PT Start Time 1130   pt mistakenly thought his appt was at 11:30am and arrived late   PT Stop Time 1200    PT Time Calculation (min) 30 min    Activity Tolerance Patient tolerated treatment well;No increased pain    Behavior During Therapy WFL for tasks assessed/performed              Past Medical History:  Diagnosis Date   Anoxic brain injury (HCC)    Anxiety    Arthritis    Cancer (HCC) 2010   basal cell ca, head   Cataract    bilateral   CHF (congestive heart failure) (HCC)    Coronary artery disease    Depression    ED (erectile dysfunction)    GERD (gastroesophageal reflux disease)    Heart failure (HCC)    Hyperlipidemia    Hypertension    Kidney stone 07/29/2011   passed    Myocardial infarction [facility])    Neuromuscular disorder (HCC)    neuropathy legs   Psoriasis    Vitreous floater    left eye, sees [facility]. [facility]ren [name]    Past Surgical History:  Procedure Laterality Date   BASAL CELL CARCINOMA EXCISION     removed from scalp   COLONOSCOPY  07/03/2017   per Dr. 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) 09/03/2021   Type 2 diabetes mellitus with diabetic chronic kidney disease (HCC) 05/30/2021   Stage 3b chronic kidney disease (HCC) 05/30/2021   Coronary artery calcification seen on CAT scan 06/15/2019   Nodule of lower lobe of left lung 06/15/2019   Tremor of right hand 04/24/2017   Visual floaters 04/04/2013   Subjective vision disturbance, left 04/04/2013   BPH with urinary obstruction 01/01/2010   MELANOMA, HX OF 06/01/2009   Hyperlipemia, mixed 04/12/2007   Mood disorder (HCC) 04/12/2007   Essential hypertension 04/12/2007   GERD 04/12/2007    REFERRING DIAG: lumbosacral DDD (degenerative disc disease), Primary osteoarthritis of right hip  THERAPY DIAG:  Pain in right hip  Difficulty in walking, not elsewhere classified  Rationale  for Evaluation and Treatment: Rehabilitation  PERTINENT HISTORY: [name] is a 71yoM who started having acute Rt hip pain in March 2024 after aggressive use of Rt leg to stomp on a diggin' shovel. Inititally pt had quite of bit of severe pain, mostly posterior Right hip, limited tolerance to walking, standing. Pt saw orthopedics and later sports med, imaging revealing of Rt hip OA, placed on prednisone taper, other analgesics and still taking scheduled tylenol arthritis as of 03/25/23. Pain is currently 2-3/10 which is common, but can be as high as 4-5/10 at worse over the most recent week. Pt is known to our services from PT for chornic low back pain. Pt sustained 1 fall in June whiles upstepping a sidewalk curb, caught his Right  toes and fell into a modified quadruped posture, some subsequent exacerbation of Rt hip pain, but within tolerable levels. No other recent falls or close calls. This episode of Rt hip pain is far more painful than his typical 20 year history of low back pain, but he has had no prior episodes of rght hip pain. Pt cannot take NSAID medications due to renal status.   PRECAUTIONS: Back (renal status/medications)  SUBJECTIVE:   SUBJECTIVE STATEMENT: Patient is 14 min late and states he thought his appointment time was 11:30 am today instead of 11:15 am. He states he would like his blood pressure to be checked because it has been higher than usual over the last few days. He cannot think of anything new except he did not continue taking 4 tylenol arthritis tablets; he has been taking 2 in the morning and one in the evening. He had 3/10 pain and stiffness when he got up. When it limbers up and he sits on the heating pad he feels better. He states he can walk up the stairs normally now (but with a little pain) and 2 weeks ago he could not do that. Coming down the stairs has never bothered him.   PAIN:  Are you having pain?  NPRS:  0/10 right lower back and down the the posterior right hip  OBJECTIVE  Vitals:   04/06/23 1138  BP: 130/70  Pulse: 65  SpO2: 98%     TODAY'S TREATMENT: Therapeutic exercise: to centralize symptoms and improve ROM, strength, muscular endurance, and activity tolerance required for successful completion of functional activities.  - vitals measurement (see above) - NuStep level 5 using bilateral upper and lower extremities. Seat/handle setting 12/13. For improved extremity mobility, muscular endurance, and activity tolerance; and to induce the analgesic effect of aerobic exercise, stimulate improved joint nutrition, and prepare body structures and systems for following interventions. x 5  minutes. Average SPM = 92. - hooklying hip abduction against BlueTB, 1x10  - hooklying  bridge with hip abduction, 1x10 with 5 second holds  Pt required multimodal cuing for proper technique and to facilitate improved neuromuscular control, strength, range of motion, and functional ability resulting in improved performance and form.  Manual therapy: to reduce pain and tissue tension, improve range of motion, neuromodulation, in order to promote improved ability to complete functional activities. - R hip LAD 3x45 seconds in open pack position with belt  PATIENT EDUCATION: Education details: Exercise purpose/form. Self management techniques. Education on diagnosis, prognosis, POC, anatomy and physiology of current condition Education on HEP including handout  Person educated: Patient Education method: Explanation, Demonstration, Tactile cues, Verbal cues, and Handouts Education comprehension: verbalized understanding, returned demonstration, and needs further education  HOME EXERCISE PROGRAM: Access Code: [email or web address]  URL: [email or web address] Date: 04/06/2023 Prepared by: Norton Blizzard  Exercises - Hooklying Clamshell with Resistance  - 1-2 x daily - 2 sets - 10 reps - 5 seconds hold - Bridge with Hip Abduction and Resistance - Ground Touches  - 1-2 x daily - 1 sets - 20 reps - 5 seconds hold  ASSESSMENT:         Clinical Impression Statement Patient arrives late due to mistaken start time for his PT visit, so session was shortened accordingly. Patient arrives with report of improved pain and function since last PT session. Today's session included continued long axis distraction through the right hip and gentle progressions in strengthening exercises. Plan to progress to weight bearing position next session as tolerated. Patient would benefit from continued management of limiting condition by skilled physical therapist to address remaining impairments and functional limitations to work towards stated goals and return to PLOF or maximal functional independence.     Personal Factors and Comorbidities Age;Fitness;Past/Current Experience    Examination-Activity Limitations Locomotion Level;Transfers;Dressing;Stand    Examination-Participation Restrictions Community Activity;Church;Meal Prep;Pincus Badder [facility]    Stability/Clinical Decision Making Stable/Uncomplicated    Rehab Potential Excellent    PT Frequency 2x / week    PT Duration 6 weeks    PT Treatment/Interventions ADLs/Self Care Home Management;Electrical Stimulation    PT Next Visit Plan Progressive hip/core/functional strengthening and ROM exercises as tolerated. Education. Manual therapy and/or dry needling as needed.    Consulted and Agree with Plan of Care Patient              PT Short Term Goals   Target date for all short term goals: 04/24/2023    PT SHORT TERM GOAL #1   Title Pt to report resuming dog walks ad lib 3-4x weekly without exacerbation symptoms in Right hip.    Baseline eval: has not returned to PLOF out of caution (03/25/2023)   Time 4    Period Weeks    Status In-progress   Target Date 04/24/23      PT SHORT TERM GOAL #2   Title Pt to report reduced intensity and improved consistentcy in Rt hip symptoms // to transition from scheduled tylenol use to PRN tylenol use.    Baseline eval: taking tylenol arthritis scheduled 3x daily. (03/25/2023);    Time 4    Period Weeks    Status  In-progress   Target Date 04/24/23              PT Long Term Goals:  Target date for all long term goals: 05/22/2023    PT LONG TERM GOAL #1   Title Pt to improve FOTO score >8 points to indiciated reduced level of difficulty in performance of ADL adn IADL.    Baseline visit 2 value pending: 61 (04/01/2023);    Time 8    Period  Weeks   Status In-Progress   Target Date 05/22/23      PT LONG TERM GOAL #2   Title Pt to demonstrate performance without exacerbation of Rt hip pain, no greater LBP exacerbation than 2 points on NPRS.    Baseline eval: has mild back pain increase and  onset antalgia after 457ft during walking assessment (03/25/2023);    Time 8    Period Weeks    Status In- progress   Target Date 05/22/23      PT LONG TERM GOAL #3   Title Pt to report back to baseline in terms of ADL, leisure, and fitness activity  without any remaining Rt hip symptoms or concerns regarding potential exacerbation. (03/25/2023);    Time 8    Period Weeks    Status In-progress   Target Date 05/22/23              Cira Rue, PT, DPT 04/06/2023, 12:01 PM  [facility] [address] [place], [place] [address] P: [phone] I F: [phone]

## 2023-04-09 ENCOUNTER — Encounter: Payer: Self-pay | Admitting: Physical Therapy

## 2023-04-09 ENCOUNTER — Ambulatory Visit: Payer: Medicare PPO | Admitting: Physical Therapy

## 2023-04-09 DIAGNOSIS — R262 Difficulty in walking, not elsewhere classified: Secondary | ICD-10-CM | POA: Diagnosis not present

## 2023-04-09 DIAGNOSIS — M6281 Muscle weakness (generalized): Secondary | ICD-10-CM

## 2023-04-09 DIAGNOSIS — M25551 Pain in right hip: Secondary | ICD-10-CM | POA: Diagnosis not present

## 2023-04-09 DIAGNOSIS — M5459 Other low back pain: Secondary | ICD-10-CM | POA: Diagnosis not present

## 2023-04-09 DIAGNOSIS — M5137 Other intervertebral disc degeneration, lumbosacral region: Secondary | ICD-10-CM | POA: Diagnosis not present

## 2023-04-09 DIAGNOSIS — M1611 Unilateral primary osteoarthritis, right hip: Secondary | ICD-10-CM | POA: Diagnosis not present

## 2023-04-09 NOTE — Therapy (Signed)
OUTPATIENT PHYSICAL THERAPY TREATMENT NOTE   Patient Name: Nestor Wieneke MRN: [id number] DOB:1950/11/17, 72 y.o., male Today's Date: 04/09/2023  PCP: Karie Schwalbe, MD REFERRING PROVIDER: Hannah Beat, MD  END OF SESSION:  PT End of Session - 04/09/23 1036     Visit Number 4    Number of Visits 12    Date for PT Re-Evaluation 05/06/23    Authorization Type [employer] PPO reporting period from 03/25/2023    Authorization Time Period auth 22 PT visits  7/3 - 9/13  Authorization #[id number]    Authorization - Visit Number 4    Authorization - Number of Visits 22    Progress Note Due on Visit 10    PT Start Time 1031    PT Stop Time 1110    PT Time Calculation (min) 39 min    Activity Tolerance Patient tolerated treatment well;No increased pain    Behavior During Therapy WFL for tasks assessed/performed               Past Medical History:  Diagnosis Date   Anoxic brain injury (HCC)    Anxiety    Arthritis    Cancer (HCC) 2010   basal cell ca, head   Cataract    bilateral   CHF (congestive heart failure) (HCC)    Coronary artery disease    Depression    ED (erectile dysfunction)    GERD (gastroesophageal reflux disease)    Heart failure (HCC)    Hyperlipidemia    Hypertension    Kidney stone 07/29/2011   passed    Myocardial infarction [facility])    Neuromuscular disorder (HCC)    neuropathy legs   Psoriasis    Vitreous floater    left eye, sees Dr. [facility]ren Bracket at [facility]    Past Surgical History:  Procedure Laterality Date   BASAL CELL CARCINOMA EXCISION     removed from scalp   COLONOSCOPY  07/03/2017   per Dr. 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) 09/03/2021   Type 2 diabetes mellitus with diabetic chronic kidney disease (HCC) 05/30/2021   Stage 3b chronic kidney disease (HCC) 05/30/2021   Coronary artery calcification seen on CAT scan 06/15/2019   Nodule of lower lobe of left lung 06/15/2019   Tremor of right hand 04/24/2017   Visual floaters 04/04/2013   Subjective vision disturbance, left 04/04/2013   BPH with urinary obstruction 01/01/2010   MELANOMA, HX OF 06/01/2009   Hyperlipemia, mixed 04/12/2007   Mood disorder (HCC) 04/12/2007   Essential hypertension 04/12/2007   GERD 04/12/2007    REFERRING DIAG: lumbosacral DDD (degenerative disc disease), Primary osteoarthritis of right hip  THERAPY DIAG:  Pain in right hip  Difficulty in walking, not elsewhere classified  Other low back pain  Muscle weakness (generalized)  Rationale for Evaluation  and Treatment: Rehabilitation  PERTINENT HISTORY: Cleveland Yarbro is a 71yoM who started having acute Rt hip pain in March 2024 after aggressive use of Rt leg to stomp on a diggin' shovel. Inititally pt had quite of bit of severe pain, mostly posterior Right hip, limited tolerance to walking, standing. Pt saw orthopedics and later sports med, imaging revealing of Rt hip OA, placed on prednisone taper, other analgesics and still taking scheduled tylenol arthritis as of 03/25/23. Pain is currently 2-3/10 which is common, but can be as high as 4-5/10 at worse over the most recent week. Pt is known to our services from PT for chornic low back pain. Pt sustained 1 fall in June whiles upstepping a sidewalk curb, caught his Right toes and fell  into a modified quadruped posture, some subsequent exacerbation of Rt hip pain, but within tolerable levels. No other recent falls or close calls. This episode of Rt hip pain is far more painful than his typical 20 year history of low back pain, but he has had no prior episodes of rght hip pain. Pt cannot take NSAID medications due to renal status.   PRECAUTIONS: Back (renal status/medications)  SUBJECTIVE:   SUBJECTIVE STATEMENT: Patient states his back is sore today after going back to the gym yesterday. He states he did the stepper and the treadmill and "didn't go overboard" because it was the first time he had been in 3 months. He states he does not remember having any increased pain after last PT session.   PAIN:  Are you having pain?  NPRS:  2-3/10 across lower back  OBJECTIVE  TODAY'S TREATMENT: Therapeutic exercise: to centralize symptoms and improve ROM, strength, muscular endurance, and activity tolerance required for successful completion of functional activities.  - vitals measurement (see above) - NuStep level 5 using bilateral upper and lower extremities. Seat/handle setting 12/13. For improved extremity mobility, muscular endurance, and activity tolerance; and to induce the analgesic effect of aerobic exercise, stimulate improved joint nutrition, and prepare body structures and systems for following interventions. x 5  minutes. Average SPM = 91. (Manual therapy - see below) - hooklying bridge with hip abduction against BlackTB, 1x10 with 5 second holds - lateral stepping back and forth over small 6 inch aerobic step, TM bar available for balance as needed, 2x10 each way.  - sled push, 6x30 feet with 50# loaded on sled (75# with no weight) - standing hip hikes on 2x4 board, 1x10 each side with U UE support  Pt required multimodal cuing for proper technique and to facilitate improved neuromuscular control, strength, range of motion, and functional ability resulting in improved  performance and form.  Manual therapy: to reduce pain and tissue tension, improve range of motion, neuromodulation, in order to promote improved ability to complete functional activities. - R hip LAD 3x45 seconds in open pack position with belt  PATIENT EDUCATION: Education details: Exercise purpose/form. Self management techniques. Education on diagnosis, prognosis, POC, anatomy and physiology of current condition Education on HEP including handout  Person educated: Patient Education method: Explanation, Demonstration, Tactile cues, Verbal cues, and Handouts Education comprehension: verbalized understanding, returned demonstration, and needs further education  HOME EXERCISE PROGRAM: Access Code: [email or web address] URL: [email or web address] Date: 04/06/2023 Prepared by: Norton Blizzard  Exercises - Hooklying Clamshell with Resistance  - 1-2 x daily - 2 sets - 10 reps - 5 seconds hold - Bridge with Hip Abduction and Resistance - Ground Touches  - 1-2 x daily - 1 sets - 20 reps -  5 seconds hold  ASSESSMENT:         Clinical Impression Statement Patient arrives reporting mild back soreness after going back to the gym yesterday. Today's session continued with LAD for right hip and progressions in LE/hip strengthening that he tolerated well. Plan to continue with similar interventions progressed as appropriate next session.    Personal Factors and Comorbidities Age;Fitness;Past/Current Experience    Examination-Activity Limitations Locomotion Level;Transfers;Dressing;Stand    Examination-Participation Restrictions Community Activity;Church;Meal Prep;Pincus Badder [facility]    Stability/Clinical Decision Making Stable/Uncomplicated    Rehab Potential Excellent    PT Frequency 2x / week    PT Duration 6 weeks    PT Treatment/Interventions ADLs/Self Care Home Management;Electrical Stimulation    PT Next Visit Plan Progressive hip/core/functional strengthening and ROM exercises as tolerated.  Education. Manual therapy and/or dry needling as needed.    Consulted and Agree with Plan of Care Patient              PT Short Term Goals   Target date for all short term goals: 04/24/2023    PT SHORT TERM GOAL #1   Title Pt to report resuming dog walks ad lib 3-4x weekly without exacerbation symptoms in Right hip.    Baseline eval: has not returned to PLOF out of caution (03/25/2023)   Time 4    Period Weeks    Status In-progress   Target Date 04/24/23      PT SHORT TERM GOAL #2   Title Pt to report reduced intensity and improved consistentcy in Rt hip symptoms // to transition from scheduled tylenol use to PRN tylenol use.    Baseline eval: taking tylenol arthritis scheduled 3x daily. (03/25/2023);    Time 4    Period Weeks    Status  In-progress   Target Date 04/24/23              PT Long Term Goals:  Target date for all long term goals: 05/22/2023    PT LONG TERM GOAL #1   Title Pt to improve FOTO score >8 points to indiciated reduced level of difficulty in performance of ADL adn IADL.    Baseline visit 2 value pending: 61 (04/01/2023);    Time 8    Period  Weeks   Status In-Progress   Target Date 05/22/23      PT LONG TERM GOAL #2   Title Pt to demonstrate performance without exacerbation of Rt hip pain, no greater LBP exacerbation than 2 points on NPRS.    Baseline eval: has mild back pain increase and onset antalgia after 426ft during walking assessment (03/25/2023);    Time 8    Period Weeks    Status In- progress   Target Date 05/22/23      PT LONG TERM GOAL #3   Title Pt to report back to baseline in terms of ADL, leisure, and fitness activity without any remaining Rt hip symptoms or concerns regarding potential exacerbation. (03/25/2023);    Time 8    Period Weeks    Status In-progress   Target Date 05/22/23              Cira Rue, PT, DPT 04/09/2023, 11:11 AM  [facility] [address] [place], [place] [address] P: [phone] I F: [phone]

## 2023-04-15 ENCOUNTER — Ambulatory Visit: Payer: Medicare PPO | Admitting: Physical Therapy

## 2023-04-20 ENCOUNTER — Encounter: Payer: Medicare PPO | Admitting: Physical Therapy

## 2023-04-22 ENCOUNTER — Encounter (INDEPENDENT_AMBULATORY_CARE_PROVIDER_SITE_OTHER): Payer: Self-pay

## 2023-04-23 ENCOUNTER — Encounter: Payer: Self-pay | Admitting: Physical Therapy

## 2023-04-23 ENCOUNTER — Ambulatory Visit: Payer: Medicare PPO | Attending: Family Medicine | Admitting: Physical Therapy

## 2023-04-23 DIAGNOSIS — M6281 Muscle weakness (generalized): Secondary | ICD-10-CM | POA: Insufficient documentation

## 2023-04-23 DIAGNOSIS — M5459 Other low back pain: Secondary | ICD-10-CM | POA: Insufficient documentation

## 2023-04-23 DIAGNOSIS — M25551 Pain in right hip: Secondary | ICD-10-CM | POA: Insufficient documentation

## 2023-04-23 DIAGNOSIS — R262 Difficulty in walking, not elsewhere classified: Secondary | ICD-10-CM | POA: Diagnosis not present

## 2023-04-23 NOTE — Therapy (Signed)
OUTPATIENT PHYSICAL THERAPY TREATMENT NOTE   Patient Name: [name] MRN: [id number] DOB:09/06/1951, 72 y.o., male Today's Date: 04/23/2023  PCP: Karie Schwalbe, MD REFERRING PROVIDER: Hannah Beat, MD  END OF SESSION:  PT End of Session - 04/23/23 1151     Visit Number 5    Number of Visits 12    Date for PT Re-Evaluation 05/06/23    Authorization Type [employer] P[employer] reporting period from 03/25/2023    Authorization Time Period auth 22 PT visits  7/3 - 9/13  Authorization #[id number]    Authorization - Visit Number 5    Authorization - Number of Visits 22    Progress Note Due on Visit 10    PT Start Time 1120    PT Stop Time 1200    PT Time Calculation (min) 40 min    Activity Tolerance Patient tolerated treatment well;No increased pain    Behavior During Therapy WFL for tasks assessed/performed                Past Medical History:  Diagnosis Date   Anoxic brain injury (HCC)    Anxiety    Arthritis    Cancer (HCC) 2010   basal cell ca, head   Cataract    bilateral   CHF (congestive heart failure) (HCC)    Coronary artery disease    Depression    ED (erectile dysfunction)    GERD (gastroesophageal reflux disease)    Heart failure (HCC)    Hyperlipidemia    Hypertension    Kidney stone 07/29/2011   passed    Myocardial infarction [facility])    Neuromuscular disorder (HCC)    neuropathy legs   Psoriasis    Vitreous floater    left eye, sees Dr. Oren Bracket at [facility]    Past Surgical History:  Procedure Laterality Date   BASAL CELL CARCINOMA EXCISION     removed from scalp   COLONOSCOPY  07/03/2017   per Dr. 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) 09/03/2021   Type 2 diabetes mellitus with diabetic chronic kidney disease (HCC) 05/30/2021   Stage 3b chronic kidney disease (HCC) 05/30/2021   Coronary artery calcification seen on CAT scan 06/15/2019   Nodule of lower lobe of left lung 06/15/2019   Tremor of right hand 04/24/2017   Visual floaters 04/04/2013   Subjective vision disturbance, left 04/04/2013   BPH with urinary obstruction 01/01/2010   MELANOMA, HX OF 06/01/2009   Hyperlipemia, mixed 04/12/2007   Mood disorder (HCC) 04/12/2007   Essential hypertension 04/12/2007   GERD 04/12/2007    REFERRING DIAG: lumbosacral DDD (degenerative disc disease), Primary osteoarthritis of right hip  THERAPY DIAG:  Pain in right hip  Difficulty in walking, not elsewhere classified  Other low back pain  Muscle weakness (generalized)  Rationale for  Evaluation and Treatment: Rehabilitation  PERTINENT HISTORY: [name] is a 71yoM who started having acute Rt hip pain in March 2024 after aggressive use of Rt leg to stomp on a diggin' shovel. Inititally pt had quite of bit of severe pain, mostly posterior Right hip, limited tolerance to walking, standing. Pt saw orthopedics and later sports med, imaging revealing of Rt hip OA, placed on prednisone taper, other analgesics and still taking scheduled tylenol arthritis as of 03/25/23. Pain is currently 2-3/10 which is common, but can be as high as 4-5/10 at worse over the most recent week. Pt is known to our services from PT for chornic low back pain. Pt sustained 1 fall in June whiles upstepping a sidewalk curb, caught his Right toes and fell  into a modified quadruped posture, some subsequent exacerbation of Rt hip pain, but within tolerable levels. No other recent falls or close calls. This episode of Rt hip pain is far more painful than his typical 20 year history of low back pain, but he has had no prior episodes of rght hip pain. Pt cannot take NSAID medications due to renal status.   PRECAUTIONS: Back (renal status/medications)  SUBJECTIVE:   SUBJECTIVE STATEMENT: Patient states he continues to be very stiff in the mornings across his back with difficulty standing up until he warms up. He occasionally gets a shooting pain near his right flank. He has been going to the gym about 1x per week currently. He spends at least 40 minutes there using various cardio machines. He used to practice 5 laps of 13 steps he has at home twice a week, but not since his hip pain. His hip has been feeling pretty good. He occasionally gets a little nagging pain that only lasts a few seconds. Sometimes when walking or standing he can feel his lower back and feels pressure not pain at the lateral right hip. He thinks he is doing better overall. He has been trying to remember to keep his back and face up when walking, but finds himself wanting to look down so he doesn't trip. He has been walking with his dog at [employer] park usually twice a day, 7 days a week.   Patient states his back is sore today after going back to the gym yesterday. He states he did the stepper and the treadmill and "didn't go overboard" because it was the first time he had been in 3 months. He states he does not remember having any increased pain after last PT session.   PAIN:  Are you having pain?  NPRS:  1-2/10 across lower back, R > L  OBJECTIVE  SELF-REPORTED FUNCTION FOTO score: 61/100 (hip questionnaire)   TODAY'S TREATMENT: Therapeutic exercise: to centralize symptoms and improve ROM, strength, muscular endurance, and activity tolerance required for successful completion  of functional activities.  - vitals measurement (see above) - NuStep level 6 using bilateral upper and lower extremities. Seat/handle setting 12/13. For improved extremity mobility, muscular endurance, and activity tolerance; and to induce the analgesic effect of aerobic exercise, stimulate improved joint nutrition, and prepare body structures and systems for following interventions. x 9 minutes. Average SPM = 91. - standing hip hikes on 2x4 board, 3x10 each side with U UE support - sled push, 6x30 feet with 70# loaded on 75# sled (patient assisted by PT to turn sled).  - single leg runner's step up with unilateral UE support (opposite step up leg), 3x10 each side (no increased pain).   Pt required multimodal  cuing for proper technique and to facilitate improved neuromuscular control, strength, range of motion, and functional ability resulting in improved performance and form.  Manual therapy: to reduce pain and tissue tension, improve range of motion, neuromodulation, in order to promote improved ability to complete functional activities. - R hip LAD 3x45 seconds in open pack position with belt  PATIENT EDUCATION: Education details: Exercise purpose/form. Self management techniques. Education on diagnosis, prognosis, POC, anatomy and physiology of current condition Education on HEP including handout  Person educated: Patient Education method: Explanation, Demonstration, Tactile cues, Verbal cues, and Handouts Education comprehension: verbalized understanding, returned demonstration, and needs further education  HOME EXERCISE PROGRAM: Access Code: [email or web address] URL: [email or web address] Date: 04/06/2023 Prepared by: Norton Blizzard  Exercises - Hooklying Clamshell with Resistance  - 1-2 x daily - 2 sets - 10 reps - 5 seconds hold - Bridge with Hip Abduction and Resistance - Ground Touches  - 1-2 x daily - 1 sets - 20 reps - 5 seconds hold  ASSESSMENT:         Clinical Impression  Statement Patient arrives reporting mild back pain and improved right hip pain. FOTO shows on improvement in confidence completing functional activities on the questionnaire, although patient reports improvement in pain but continued anxiety about causing previous severe pain again. Patient educated about kinesiophobia and how it can limit quality of life with encouragement to explore what he can do with gradual increase in activities he is worried about. Patient complete exercises in clinic with no lasting increase in pain. He did have some R hip discomfort with sled that resolved with rest and was not enough for him to want to stop. Patient has not yet met his goals and patient would benefit from continued management of limiting condition by skilled physical therapist to address remaining impairments and functional limitations to work towards stated goals and return to PLOF or maximal functional independence.     Personal Factors and Comorbidities Age;Fitness;Past/Current Experience    Examination-Activity Limitations Locomotion Level;Transfers;Dressing;Stand    Examination-Participation Restrictions Community Activity;Church;Meal Prep;Pincus Badder [facility]    Stability/Clinical Decision Making Stable/Uncomplicated    Rehab Potential Excellent    PT Frequency 2x / week    PT Duration 6 weeks    PT Treatment/Interventions ADLs/Self Care Home Management;Electrical Stimulation    PT Next Visit Plan Progressive hip/core/functional strengthening and ROM exercises as tolerated. Education. Manual therapy and/or dry needling as needed.    Consulted and Agree with Plan of Care Patient              PT Short Term Goals   Target date for all short term goals: 04/24/2023    PT SHORT TERM GOAL #1   Title Pt to report resuming dog walks ad lib 3-4x weekly without exacerbation symptoms in Right hip.    Baseline eval: has not returned to PLOF out of caution (03/25/2023)   Time 4    Period Weeks    Status  In-progress   Target Date 04/24/23      PT SHORT TERM GOAL #2   Title Pt to report reduced intensity and improved consistentcy in Rt hip symptoms // to transition from scheduled tylenol use to PRN tylenol use.    Baseline eval: taking tylenol arthritis scheduled 3x daily. (03/25/2023);    Time 4    Period Weeks    Status  In-progress   Target Date 04/24/23              PT Long Term Goals:  Target date for all long term goals: 05/22/2023    PT LONG TERM GOAL #1   Title Pt to improve FOTO score >8 points to indiciated reduced level of difficulty in performance of ADL adn IADL.    Baseline visit 2 value pending: 61 (04/01/2023);    Time 8    Period  Weeks   Status In-Progress   Target Date 05/22/23      PT LONG TERM GOAL #2   Title Pt to demonstrate performance without exacerbation of Rt hip pain, no greater LBP exacerbation than 2 points on NPRS.    Baseline eval: has mild back pain increase and onset antalgia after 422ft during walking assessment (03/25/2023);    Time 8    Period Weeks    Status In- progress   Target Date 05/22/23      PT LONG TERM GOAL #3   Title Pt to report back to baseline in terms of ADL, leisure, and fitness activity without any remaining Rt hip symptoms or concerns regarding potential exacerbation. (03/25/2023);    Time 8    Period Weeks    Status In-progress   Target Date 05/22/23              Cira Rue, PT, DPT 04/23/2023, 12:04 PM  [facility] [address] [place], [place] [address] P: [phone] I F: [phone]

## 2023-05-01 ENCOUNTER — Encounter: Payer: Self-pay | Admitting: Internal Medicine

## 2023-05-02 ENCOUNTER — Other Ambulatory Visit (HOSPITAL_COMMUNITY): Payer: Self-pay | Admitting: Cardiovascular Disease

## 2023-05-03 ENCOUNTER — Other Ambulatory Visit: Payer: Self-pay | Admitting: Cardiovascular Disease

## 2023-05-03 ENCOUNTER — Other Ambulatory Visit: Payer: Self-pay | Admitting: Internal Medicine

## 2023-05-03 DIAGNOSIS — I10 Essential (primary) hypertension: Secondary | ICD-10-CM

## 2023-05-04 MED ORDER — LORAZEPAM 2 MG PO TABS: 2.0000 mg | ORAL_TABLET | Freq: Four times a day (QID) | ORAL | 0 refills | Status: DC | PRN

## 2023-05-06 ENCOUNTER — Ambulatory Visit: Payer: Medicare PPO | Admitting: Physical Therapy

## 2023-05-06 ENCOUNTER — Encounter: Payer: Self-pay | Admitting: Physical Therapy

## 2023-05-06 DIAGNOSIS — M25551 Pain in right hip: Secondary | ICD-10-CM

## 2023-05-06 DIAGNOSIS — M6281 Muscle weakness (generalized): Secondary | ICD-10-CM | POA: Diagnosis not present

## 2023-05-06 DIAGNOSIS — R262 Difficulty in walking, not elsewhere classified: Secondary | ICD-10-CM | POA: Diagnosis not present

## 2023-05-06 DIAGNOSIS — M5459 Other low back pain: Secondary | ICD-10-CM | POA: Diagnosis not present

## 2023-05-06 NOTE — Therapy (Signed)
OUTPATIENT PHYSICAL THERAPY TREATMENT / DISCHARGE SUMMARY Dates of reporting from 03/25/2023 to 05/06/2023   Patient Name: [name] MRN: [id number] DOB:1951-05-12, 72 y.o., male Today's Date: 05/06/2023  PCP: Karie Schwalbe, MD REFERRING PROVIDER: Hannah Beat, MD  END OF SESSION:  PT End of Session - 05/06/23 1039     Visit Number 6    Number of Visits 12    Date for PT Re-Evaluation 05/06/23    Authorization Type [employer] PPO reporting period from 03/25/2023    Authorization Time Period auth 22 PT visits  7/3 - 9/13  Authorization #[id number]    Authorization - Visit Number 6    Authorization - Number of Visits 22    Progress Note Due on Visit 10    PT Start Time 1035    PT Stop Time 1113    PT Time Calculation (min) 38 min    Activity Tolerance Patient tolerated treatment well;No increased pain    Behavior During Therapy WFL for tasks assessed/performed                 Past Medical History:  Diagnosis Date   Anoxic brain injury (HCC)    Anxiety    Arthritis    Cancer (HCC) 2010   basal cell ca, head   Cataract    bilateral   CHF (congestive heart failure) (HCC)    Coronary artery disease    Depression    ED (erectile dysfunction)    GERD (gastroesophageal reflux disease)    Heart failure (HCC)    Hyperlipidemia    Hypertension    Kidney stone 07/29/2011   passed    Myocardial infarction [facility])    Neuromuscular disorder (HCC)    neuropathy legs   Psoriasis    Vitreous floater    left eye, sees Dr. Oren [name] at [facility]    Past Surgical History:  Procedure Laterality Date   BASAL CELL CARCINOMA EXCISION     removed from scalp   COLONOSCOPY  07/03/2017   per Dr. 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) 09/03/2021   Type 2 diabetes mellitus with diabetic chronic kidney disease (HCC) 05/30/2021   Stage 3b chronic kidney disease (HCC) 05/30/2021   Coronary artery calcification seen on CAT scan 06/15/2019   Nodule of lower lobe of left lung 06/15/2019   Tremor of right hand 04/24/2017   Visual floaters 04/04/2013   Subjective vision disturbance, left 04/04/2013   BPH with urinary obstruction 01/01/2010   MELANOMA, HX OF 06/01/2009   Hyperlipemia, mixed 04/12/2007   Mood disorder (HCC) 04/12/2007   Essential hypertension 04/12/2007   GERD 04/12/2007    REFERRING DIAG: lumbosacral DDD (degenerative disc disease), Primary osteoarthritis of right hip  THERAPY DIAG:  Pain in right hip  Difficulty in walking, not elsewhere classified  Rationale  for Evaluation and Treatment: Rehabilitation  PERTINENT HISTORY: [name] is a 71yoM who started having acute Rt hip pain in March 2024 after aggressive use of Rt leg to stomp on a diggin' shovel. Inititally pt had quite of bit of severe pain, mostly posterior Right hip, limited tolerance to walking, standing. Pt saw orthopedics and later sports med, imaging revealing of Rt hip OA, placed on prednisone taper, other analgesics and still taking scheduled tylenol arthritis as of 03/25/23. Pain is currently 2-3/10 which is common, but can be as high as 4-5/10 at worse over the most recent week. Pt is known to our services from PT for chornic low back pain. Pt sustained 1 fall in June whiles upstepping a sidewalk curb, caught his Right  toes and fell into a modified quadruped posture, some subsequent exacerbation of Rt hip pain, but within tolerable levels. No other recent falls or close calls. This episode of Rt hip pain is far more painful than his typical 20 year history of low back pain, but he has had no prior episodes of rght hip pain. Pt cannot take NSAID medications due to renal status.   PRECAUTIONS: Back (renal status/medications)  SUBJECTIVE:   SUBJECTIVE STATEMENT: Hip has been doing really good. He has been walking more since the weather is cooler. He has been walking up and down the steps. 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 He states he is a little sore after last PT session.   PAIN:  Are you having pain?  NPRS:  1/10 stiffness across lower back when he first stands up.   OBJECTIVE  SELF-REPORTED FUNCTION FOTO score: 87/100 (hip questionnaire)  TODAY'S TREATMENT: Therapeutic exercise: to centralize symptoms and improve ROM, strength, muscular endurance, and activity tolerance required for successful completion of functional activities.  - Treadmill 2 mph at 3% grade with B UE support. For improved lower extremity mobility, muscular endurance, and weightbearing activity tolerance; and to induce the analgesic effect of aerobic exercise, stimulate improved joint nutrition, and prepare body structures and systems for following interventions. x 5  minutes. Required assistance to set up machine. - standing hip hikes on 2x4 board, 3x15 each side with U UE support - trial of light jogging and right single leg hop while filling out FOTO, education on activities and discharge recommendations.  - single leg runner's step up with unilateral UE support (opposite step up leg), 3x10 each side, 8 inch step.  - Education on HEP including handout   Pt required multimodal cuing for proper technique and to facilitate improved neuromuscular control, strength, range of motion, and  functional ability resulting in improved performance and form.   PATIENT EDUCATION: Education details: Exercise purpose/form. Self management techniques. Education on diagnosis, prognosis, POC, anatomy and physiology of current condition Education on HEP including handout  Person educated: Patient Education method: Explanation, Demonstration, Tactile cues, Verbal cues, and Handouts Education comprehension: verbalized understanding, returned demonstration, and needs further education  HOME EXERCISE PROGRAM: Access Code: [email or web address] URL: [email or web address] Date: 05/06/2023 Prepared by: Norton Blizzard  Exercises - Hooklying Clamshell with Resistance  - 1-2 x daily - 2 sets - 10 reps - 5 seconds hold - Bridge with Hip Abduction and Resistance - Ground Touches  - 1-2 x daily - 1 sets - 20 reps - 5 seconds hold - Runner's Step Up/Down  - 1 x daily - 3-5 x weekly - 3 sets - 10 reps - Hip Hikes off step  -  1 x daily - 3-5 x weekly - 3 sets - 10 reps  ASSESSMENT:         Clinical Impression Statement Patient has attended 6 physical therapy sessions since starting current episode of care on 03/25/2023. Patient appears to have returned to baseline function. He has met all of his long term and one short term goals. The remaining goal he reports he expects not to meet due to wanting to continue taking tylenol for other body parts. He is now discharged to long term HEP for independent management of his condition.     Personal Factors and Comorbidities Age;Fitness;Past/Current Experience    Examination-Activity Limitations Locomotion Level;Transfers;Dressing;Stand    Examination-Participation Restrictions Community Activity;Church;Meal Prep;Pincus Badder [facility]    Stability/Clinical Decision Making Stable/Uncomplicated    Rehab Potential Excellent    PT Frequency 2x / week    PT Duration 6 weeks    PT Treatment/Interventions ADLs/Self Care Home Management;Electrical Stimulation    PT Next  Visit Plan Patient is now discharged from PT   Consulted and Agree with Plan of Care Patient              PT Short Term Goals   Target date for all short term goals: 04/24/2023    PT SHORT TERM GOAL #1   Title Pt to report resuming dog walks ad lib 3-4x weekly without exacerbation symptoms in Right hip.    Baseline eval: has not returned to PLOF out of caution (03/25/2023); at least every morning (05/06/2023);    Time 4    Period Weeks    Status MET   Target Date 04/24/23      PT SHORT TERM GOAL #2   Title Pt to report reduced intensity and improved consistentcy in Rt hip symptoms // to transition from scheduled tylenol use to PRN tylenol use.    Baseline eval: taking tylenol arthritis scheduled 3x daily. (03/25/2023); has been backing off how much tylenol he is taking but still taking 3x a day (05/06/2023);    Time 4    Period Weeks    Status  Nearly met   Target Date 04/24/23              PT Long Term Goals:  Target date for all long term goals: 05/22/2023    PT LONG TERM GOAL #1   Title Pt to improve FOTO score >8 points to indiciated reduced level of difficulty in performance of ADL adn IADL.    Baseline visit 2 value pending: 61 (04/01/2023); 61 at visit # 61 (04/23/2023); 87 at visit #6 (05/06/2023);    Time 8    Period  Weeks   Status MET   Target Date 05/22/23      PT LONG TERM GOAL #2   Title Pt to demonstrate performance without exacerbation of Rt hip pain, no greater LBP exacerbation than 2 points on NPRS.    Baseline eval: has mild back pain increase and onset antalgia after 471ft during walking assessment (03/25/2023);  1056 feet on TM over 6 minutes at without exacerbation of hip symptoms (05/06/2023);    Time 8    Period Weeks    Status MET   Target Date 05/22/23      PT LONG TERM GOAL #3   Title Pt to report back to baseline in terms of ADL, leisure, and fitness activity without any remaining Rt hip symptoms or concerns regarding potential  exacerbation. (03/25/2023);    Time 8    Period Weeks  Status MET   Target Date 05/22/23              Cira Rue, PT, DPT 05/06/2023, 1:02 PM  [facility] [address] [place], [place] [address] P: [phone] I F: [phone]

## 2023-05-07 ENCOUNTER — Encounter (INDEPENDENT_AMBULATORY_CARE_PROVIDER_SITE_OTHER): Payer: Self-pay

## 2023-05-11 ENCOUNTER — Encounter: Payer: Self-pay | Admitting: Internal Medicine

## 2023-05-11 ENCOUNTER — Ambulatory Visit: Payer: Medicare PPO | Admitting: Internal Medicine

## 2023-05-11 VITALS — BP 130/84 | HR 72 | Temp 97.1°F | Ht 73.0 in | Wt 183.0 lb

## 2023-05-11 DIAGNOSIS — F39 Unspecified mood [affective] disorder: Secondary | ICD-10-CM | POA: Diagnosis not present

## 2023-05-11 DIAGNOSIS — H9193 Unspecified hearing loss, bilateral: Secondary | ICD-10-CM | POA: Diagnosis not present

## 2023-05-11 DIAGNOSIS — H9190 Unspecified hearing loss, unspecified ear: Secondary | ICD-10-CM | POA: Insufficient documentation

## 2023-05-11 MED ORDER — LORAZEPAM 2 MG PO TABS: 1.0000 mg | ORAL_TABLET | Freq: Three times a day (TID) | ORAL | 0 refills | Status: DC | PRN

## 2023-05-11 NOTE — Assessment & Plan Note (Signed)
He will set up with an audiologist for complete evaluation

## 2023-05-11 NOTE — Progress Notes (Signed)
Subjective:    Patient ID: [name], male    DOB: September 29, 1950, 72 y.o.   MRN: [id number]  HPI Here for follow up of mood issues  Doing okay Has been able to cut back to three times a day with the lorazepam Other stressors with hip pain--prednisone/muscle relaxers Was on PT but has finished Feels this is back to normal finally  Had been less motivated during the time Now doing better Did try cutting to 2.5 tabs daily--but he had worsening then  Will get something on his mind and then start pacing and anxiety worsens For example--sister will make comments that get him upset (and he winds up hanging up) Has distractions---like taking a walk or petting the dog Not good at dealing with adverse comments---"can't just let it roll off me"  Current Outpatient Medications on File Prior to Visit  Medication Sig Dispense Refill   aspirin 81 MG chewable tablet Chew 1 tablet (81 mg total) by mouth daily. 90 tablet 0   carvedilol (COREG) 3.125 MG tablet TAKE ONE TABLET BY MOUTH TWICE DAILY 180 tablet 3   Cholecalciferol (VITAMIN D) 50 MCG (2000 UT) CAPS Take 1 capsule by mouth daily.     diclofenac Sodium (VOLTAREN) 1 % GEL Apply 2 g topically 2 (two) times daily as needed (pain).     DULoxetine (CYMBALTA) 30 MG capsule Take 1 capsule (30 mg total) by mouth daily. 90 capsule 3   ENTRESTO 24-26 MG TAKE ONE TABLET by MOUTH twice daily 60 tablet 11   FARXIGA 10 MG TABS tablet TAKE ONE TABLET BY MOUTH BEFORE BREAKFAST 90 tablet 3   finasteride ([name]) 5 MG tablet Take 1 tablet (5 mg total) by mouth daily. 90 tablet 3   ketoconazole (NIZORAL) 2 % cream Apply 1 application topically daily as needed for irritation.     LORazepam (ATIVAN) 2 MG tablet Take 1 tablet (2 mg total) by mouth every 6 (six) hours as needed. for anxiety 120 tablet 0   melatonin 3 MG TABS tablet Take 1 tablet by mouth daily.     nitroGLYCERIN (NITROSTAT) 0.4 MG SL tablet Place 1 tablet (0.4 mg total) under the tongue  every 5 (five) minutes as needed for chest pain. 50 tablet 3   omeprazole (PRILOSEC) 20 MG capsule TAKE ONE CAPSULE by MOUTH ONCE daily as needed 30 capsule 0   rosuvastatin (CRESTOR) 10 MG tablet TAKE ONE TABLET BY MOUTH ONCE DAILY 90 tablet 3   sildenafil (REVATIO) 20 MG tablet Take 20 mg by mouth as needed.     tiZANidine (ZANAFLEX) 4 MG tablet Take 1 tablet (4 mg total) by mouth at bedtime. 30 tablet 1   triamcinolone cream (KENALOG) 0.1 % Apply 1 application topically 2 (two) times daily as needed (psoriasis).     [DISCONTINUED] tadalafil (CIALIS) 20 MG tablet Take 20 mg by mouth daily as needed.       No current facility-administered medications on file prior to visit.    Allergies  Allergen Reactions   Codeine Other (See Comments)    hallucinations   Lisinopril Cough   Nsaids     Other reaction(s): Unknown    Past Medical History:  Diagnosis Date   Anoxic brain injury (HCC)    Anxiety    Arthritis    Cancer (HCC) 2010   basal cell ca, head   Cataract    bilateral   CHF (congestive heart failure) (HCC)    Coronary artery disease    Depression  ED (erectile dysfunction)    GERD (gastroesophageal reflux disease)    Heart failure (HCC)    Hyperlipidemia    Hypertension    Kidney stone 07/29/2011   passed    Myocardial infarction [facility])    Neuromuscular disorder (HCC)    neuropathy legs   Psoriasis    Vitreous floater    left eye, sees Dr. [facility]ren [facility] at [facility]     Past Surgical History:  Procedure Laterality Date   BASAL CELL CARCINOMA EXCISION     removed from scalp   COLONOSCOPY  07/03/2017   per Dr. Adela Lank, sessile serrated polyp, repeat in 5 yrs    HERNIA REPAIR     LEFT HEART CATH AND CORONARY ANGIOGRAPHY N/A 01/03/2021   Procedure: LEFT HEART CATH AND CORONARY ANGIOGRAPHY;  Surgeon: Lennette Bihari, MD;  [facility];  Service: Cardiovascular;  Laterality: N/A;   RIGHT HEART CATH N/A 01/03/2021   Procedure: RIGHT HEART CATH;   Surgeon: Dolores Patty, MD;  Location: [facility];  Service: Cardiovascular;  Laterality: N/A;    Family History  Problem Relation Age of Onset   Hypertension Mother    Hyperlipidemia Mother    Depression Other    Diabetes Other    Hyperlipidemia Other    Hypertension Other    Alzheimer's disease Other    Colon cancer Neg Hx    Stomach cancer Neg Hx    Rectal cancer Neg Hx    Esophageal cancer Neg Hx     Social History   Socioeconomic History   Marital status: Single    Spouse name: Not on file   Number of children: 0   Years of education: Not on file   Highest education level: Master's degree (e.g., MA, MS, MEng, MEd, MSW, MBA)  Occupational History   Occupation: retired    Comment: Runner, broadcasting/film/video   Occupation: Careers adviser - [employer]  Tobacco Use   Smoking status: Never   Smokeless tobacco: Never  Vaping Use   Vaping status: Never Used  Substance and Sexual Activity   Alcohol use: No    Alcohol/week: 0.0 standard drinks of alcohol   Drug use: No   Sexual activity: Not on file  Other Topics Concern   Not on file  Social History Narrative   Lives alone--[facility]      Has living will   Health care POA-- friend Trey Paula   Would accept resuscitation--but no prolonged machines or tube feedings   Social Determinants of Health   Financial Resource Strain: Low Risk  (02/15/2023)   Overall Financial Resource Strain (CARDIA)    Difficulty of Paying Living Expenses: Not hard at all  Food Insecurity: No Food Insecurity (02/15/2023)   Hunger Vital Sign    Worried About Running Out of Food in the Last Year: Never true    Ran Out of Food in the Last Year: Never true  Transportation Needs: No Transportation Needs (02/15/2023)   PRAPARE - Administrator, Civil Service (Medical): No    Lack of Transportation (Non-Medical): No  Physical Activity: Sufficiently Active (02/15/2023)   Exercise Vital Sign    Days of Exercise per Week: 7 days    Minutes of  Exercise per Session: 30 min  Stress: Stress Concern Present (02/15/2023)   [employer] of Occupational Health - Occupational Stress Questionnaire    Feeling of Stress : To some extent  Social Connections: Unknown (02/15/2023)   Social Connection and  Isolation Panel [NHANES]    Frequency of Communication with Friends and Family: More than three times a week    Frequency of Social Gatherings with Friends and Family: Twice a week    Attends Religious Services: Patient declined    Database administrator or Organizations: Yes    Attends Engineer, structural: More than 4 times per year    Marital Status: Never married  Recent Concern: Social Connections - Moderately Isolated (12/15/2022)   Social Connection and Isolation Panel [NHANES]    Frequency of Communication with Friends and Family: More than three times a week    Frequency of Social Gatherings with Friends and Family: More than three times a week    Attends Religious Services: Never    Database administrator or Organizations: Yes    Attends Engineer, structural: More than 4 times per year    Marital Status: Never married  Intimate Partner Violence: Not At Risk (12/15/2022)   Humiliation, Afraid, Rape, and Kick questionnaire    Fear of Current or Ex-Partner: No    Emotionally Abused: No    Physically Abused: No    Sexually Abused: No   Review of Systems Did have counseling in the past---felt it helped    Objective:   Physical Exam Constitutional:      Appearance: Normal appearance.  Neurological:     Mental Status: He is alert.  Psychiatric:        Mood and Affect: Mood normal.        Behavior: Behavior normal.            Assessment & Plan:

## 2023-05-11 NOTE — Assessment & Plan Note (Signed)
Ongoing anxiety mostly---is some better on slight increase of duloxetine to 30mg  Had been started on 60mg --and he was very flat on that dose Discussed slowly going up on the duloxetine (take extra 2 per week at first, then 60 alternating with 30 every other day) to see how he does Will increase to 60mg  if he tolerates

## 2023-05-11 NOTE — Patient Instructions (Addendum)
Please increase the duloxetine---- take two of the 30mg  tabs twice a week for 2-3 weeks (and 30mg  the other days). If you have no problems with that, go up to 60mg  every other day, and 30mg  the other days. Let me know how you do---we can consider then increasing to 60mg  daily (with new prescription)---or I will have to refill the duloxetine 30mg  sooner

## 2023-05-12 ENCOUNTER — Encounter: Payer: Medicare PPO | Admitting: Physical Therapy

## 2023-05-13 ENCOUNTER — Encounter: Payer: Medicare PPO | Admitting: Physical Therapy

## 2023-05-19 ENCOUNTER — Encounter: Payer: Medicare PPO | Admitting: Physical Therapy

## 2023-05-26 ENCOUNTER — Encounter: Payer: Medicare PPO | Admitting: Physical Therapy

## 2023-05-28 ENCOUNTER — Encounter: Payer: Medicare PPO | Admitting: Physical Therapy

## 2023-06-02 ENCOUNTER — Encounter: Payer: Medicare PPO | Admitting: Physical Therapy

## 2023-06-03 DIAGNOSIS — R948 Abnormal results of function studies of other organs and systems: Secondary | ICD-10-CM | POA: Diagnosis not present

## 2023-06-03 DIAGNOSIS — N5201 Erectile dysfunction due to arterial insufficiency: Secondary | ICD-10-CM | POA: Diagnosis not present

## 2023-06-04 ENCOUNTER — Encounter: Payer: Medicare PPO | Admitting: Physical Therapy

## 2023-06-09 ENCOUNTER — Encounter: Payer: Medicare PPO | Admitting: Physical Therapy

## 2023-06-10 DIAGNOSIS — N281 Cyst of kidney, acquired: Secondary | ICD-10-CM | POA: Diagnosis not present

## 2023-06-10 DIAGNOSIS — N401 Enlarged prostate with lower urinary tract symptoms: Secondary | ICD-10-CM | POA: Diagnosis not present

## 2023-06-10 DIAGNOSIS — D49511 Neoplasm of unspecified behavior of right kidney: Secondary | ICD-10-CM | POA: Diagnosis not present

## 2023-06-10 DIAGNOSIS — R3914 Feeling of incomplete bladder emptying: Secondary | ICD-10-CM | POA: Diagnosis not present

## 2023-06-11 ENCOUNTER — Other Ambulatory Visit: Payer: Self-pay | Admitting: Internal Medicine

## 2023-06-11 ENCOUNTER — Ambulatory Visit: Payer: Medicare PPO | Admitting: Clinical

## 2023-06-11 ENCOUNTER — Encounter: Payer: Self-pay | Admitting: Internal Medicine

## 2023-06-11 ENCOUNTER — Encounter: Payer: Medicare PPO | Admitting: Physical Therapy

## 2023-06-11 MED ORDER — LORAZEPAM 2 MG PO TABS: 1.0000 mg | ORAL_TABLET | Freq: Three times a day (TID) | ORAL | 0 refills | Status: DC | PRN

## 2023-06-11 NOTE — Telephone Encounter (Signed)
Patient called in to follow up on the refill request. He stated that the pharmacy hasn't received anything regarding the refill. Thank you!

## 2023-06-16 ENCOUNTER — Encounter: Payer: Medicare PPO | Admitting: Physical Therapy

## 2023-06-17 ENCOUNTER — Encounter: Payer: Self-pay | Admitting: Internal Medicine

## 2023-06-18 ENCOUNTER — Encounter: Payer: Self-pay | Admitting: Internal Medicine

## 2023-06-18 ENCOUNTER — Encounter: Payer: Medicare PPO | Admitting: Physical Therapy

## 2023-06-18 ENCOUNTER — Ambulatory Visit (INDEPENDENT_AMBULATORY_CARE_PROVIDER_SITE_OTHER): Payer: Medicare PPO | Admitting: Internal Medicine

## 2023-06-18 ENCOUNTER — Encounter: Payer: Self-pay | Admitting: *Deleted

## 2023-06-18 VITALS — BP 128/84 | HR 66 | Temp 97.9°F | Ht 71.5 in | Wt 181.0 lb

## 2023-06-18 DIAGNOSIS — I5022 Chronic systolic (congestive) heart failure: Secondary | ICD-10-CM | POA: Diagnosis not present

## 2023-06-18 DIAGNOSIS — Z0000 Encounter for general adult medical examination without abnormal findings: Secondary | ICD-10-CM

## 2023-06-18 DIAGNOSIS — N1832 Chronic kidney disease, stage 3b: Secondary | ICD-10-CM

## 2023-06-18 DIAGNOSIS — I25119 Atherosclerotic heart disease of native coronary artery with unspecified angina pectoris: Secondary | ICD-10-CM

## 2023-06-18 DIAGNOSIS — E119 Type 2 diabetes mellitus without complications: Secondary | ICD-10-CM | POA: Insufficient documentation

## 2023-06-18 DIAGNOSIS — N2581 Secondary hyperparathyroidism of renal origin: Secondary | ICD-10-CM | POA: Diagnosis not present

## 2023-06-18 DIAGNOSIS — Z7984 Long term (current) use of oral hypoglycemic drugs: Secondary | ICD-10-CM

## 2023-06-18 DIAGNOSIS — K219 Gastro-esophageal reflux disease without esophagitis: Secondary | ICD-10-CM | POA: Diagnosis not present

## 2023-06-18 DIAGNOSIS — F39 Unspecified mood [affective] disorder: Secondary | ICD-10-CM

## 2023-06-18 DIAGNOSIS — R911 Solitary pulmonary nodule: Secondary | ICD-10-CM

## 2023-06-18 DIAGNOSIS — F1320 Sedative, hypnotic or anxiolytic dependence, uncomplicated: Secondary | ICD-10-CM

## 2023-06-18 DIAGNOSIS — E1122 Type 2 diabetes mellitus with diabetic chronic kidney disease: Secondary | ICD-10-CM | POA: Diagnosis not present

## 2023-06-18 LAB — RENAL FUNCTION PANEL
Albumin: 4.2 g/dL (ref 3.5–5.2)
BUN: 32 mg/dL — ABNORMAL HIGH (ref 6–23)
CO2: 28 mEq/L (ref 19–32)
Calcium: 9.3 mg/dL (ref 8.4–10.5)
Chloride: 103 mEq/L (ref 96–112)
Creatinine, Ser: 1.37 mg/dL (ref 0.40–1.50)
GFR: 51.44 mL/min — ABNORMAL LOW (ref >60.00)
Glucose, Bld: 98 mg/dL (ref 70–99)
Phosphorus: 3.2 mg/dL (ref 2.3–4.6)
Potassium: 4.2 mEq/L (ref 3.5–5.1)
Sodium: 138 mEq/L (ref 135–145)

## 2023-06-18 LAB — LIPID PANEL
Cholesterol: 143 mg/dL (ref 0–200)
HDL: 52 mg/dL (ref >39.00)
LDL Cholesterol: 74 mg/dL (ref 0–99)
NonHDL: 91.33
Total CHOL/HDL Ratio: 3
Triglycerides: 87 mg/dL (ref 0.0–149.0)
VLDL: 17.4 mg/dL (ref 0.0–40.0)

## 2023-06-18 LAB — VITAMIN D 25 HYDROXY (VIT D DEFICIENCY, FRACTURES): VITD: 35.84 ng/mL (ref 30.00–100.00)

## 2023-06-18 LAB — HEPATIC FUNCTION PANEL
ALT: 22 U/L (ref 0–53)
AST: 20 U/L (ref 0–37)
Albumin: 4.2 g/dL (ref 3.5–5.2)
Alkaline Phosphatase: 36 U/L — ABNORMAL LOW (ref 39–117)
Bilirubin, Direct: 0.1 mg/dL (ref 0.0–0.3)
Total Bilirubin: 0.7 mg/dL (ref 0.2–1.2)
Total Protein: 6.7 g/dL (ref 6.0–8.3)

## 2023-06-18 LAB — MICROALBUMIN / CREATININE URINE RATIO
Creatinine,U: 50.7 mg/dL
Microalb Creat Ratio: 4.2 mg/g (ref 0.0–30.0)
Microalb, Ur: 2.1 mg/dL — ABNORMAL HIGH (ref 0.0–1.9)

## 2023-06-18 LAB — CBC
HCT: 48.4 % (ref 39.0–52.0)
Hemoglobin: 15.6 g/dL (ref 13.0–17.0)
MCHC: 32.2 g/dL (ref 30.0–36.0)
MCV: 95.1 fl (ref 78.0–100.0)
Platelets: 177 10*3/uL (ref 150.0–400.0)
RBC: 5.09 Mil/uL (ref 4.22–5.81)
RDW: 13 % (ref 11.5–15.5)
WBC: 5.7 10*3/uL (ref 4.0–10.5)

## 2023-06-18 LAB — HEMOGLOBIN A1C: Hgb A1c MFr Bld: 5.6 % (ref 4.6–6.5)

## 2023-06-18 MED ORDER — OMEPRAZOLE 20 MG PO CPDR: 20.0000 mg | DELAYED_RELEASE_CAPSULE | Freq: Every day | ORAL | 3 refills | Status: DC | PRN

## 2023-06-18 NOTE — Assessment & Plan Note (Signed)
May be causing cough Asked him to increase omeprazole to daily to see if it helps--then find lowest effective dose

## 2023-06-18 NOTE — Progress Notes (Signed)
Subjective:    Patient ID: [name], male    DOB: [birth year], 72 y.o.   MRN: [id number]  HPI Here for physical   Labs reviewed Last GFR 40  Did try increasing duloxetine but hasn't tolerated that well Got up to an extra 30mg  twice a week----thinks he was tired/headaches but intermittent Hard to tell if that was the issue Does take the lorazepam three times a day. It does help him sleep  Not checking sugars Careful with eating Feet do tingle--no change---some pain in evening if he is very busy during the day (though improves with elevation, etc)  Has had some chest sensation---"tiredness" Has rarely used the nitroglycerin--it helps Tries to exercise---walks dog Sleeps flat in bed. No PND No SOB.   Does have some cough--not new Heartburn is better lately---with diet changes Now only twice a week for the omeprazole  Current Outpatient Medications on File Prior to Visit  Medication Sig Dispense Refill   aspirin 81 MG chewable tablet Chew 1 tablet (81 mg total) by mouth daily. 90 tablet 0   carvedilol (COREG) 3.125 MG tablet TAKE ONE TABLET BY MOUTH TWICE DAILY 180 tablet 3   Cholecalciferol (VITAMIN D) 50 MCG (2000 UT) CAPS Take 1 capsule by mouth daily.     diclofenac Sodium (VOLTAREN) 1 % GEL Apply 2 g topically 2 (two) times daily as needed (pain).     DULoxetine (CYMBALTA) 30 MG capsule Take 1 capsule (30 mg total) by mouth daily. 90 capsule 3   ENTRESTO 24-26 MG TAKE ONE TABLET by MOUTH twice daily 60 tablet 11   FARXIGA 10 MG TABS tablet TAKE ONE TABLET BY MOUTH BEFORE BREAKFAST 90 tablet 3   finasteride ([name]) 5 MG tablet Take 1 tablet (5 mg total) by mouth daily. 90 tablet 3   ketoconazole (NIZORAL) 2 % cream Apply 1 application topically daily as needed for irritation.     LORazepam (ATIVAN) 2 MG tablet Take 0.5-1 tablets (1-2 mg total) by mouth 3 (three) times daily as needed. for anxiety 90 tablet 0   melatonin 3 MG TABS tablet Take 1 tablet by mouth  daily.     nitroGLYCERIN (NITROSTAT) 0.4 MG SL tablet Place 1 tablet (0.4 mg total) under the tongue every 5 (five) minutes as needed for chest pain. 50 tablet 3   omeprazole (PRILOSEC) 20 MG capsule TAKE ONE CAPSULE by MOUTH ONCE daily as needed 30 capsule 0   rosuvastatin (CRESTOR) 10 MG tablet TAKE ONE TABLET BY MOUTH ONCE DAILY 90 tablet 3   sildenafil (REVATIO) 20 MG tablet Take 20 mg by mouth as needed.     tiZANidine (ZANAFLEX) 4 MG tablet Take 1 tablet (4 mg total) by mouth at bedtime. 30 tablet 1   triamcinolone cream (KENALOG) 0.1 % Apply 1 application topically 2 (two) times daily as needed (psoriasis).     [DISCONTINUED] tadalafil (CIALIS) 20 MG tablet Take 20 mg by mouth daily as needed.       No current facility-administered medications on file prior to visit.    Allergies  Allergen Reactions   Codeine Other (See Comments)    hallucinations   Lisinopril Cough   Nsaids     Other reaction(s): Unknown    Past Medical History:  Diagnosis Date   Anoxic brain injury (HCC)    Anxiety    Arthritis    Cancer (HCC) 2010   basal cell ca, head   Cataract    bilateral   CHF (congestive heart failure) (  HCC)    Coronary artery disease    Depression    ED (erectile dysfunction)    GERD (gastroesophageal reflux disease)    Heart failure (HCC)    Hyperlipidemia    Hypertension    Kidney stone 07/29/2011   passed    Myocardial infarction [facility])    Neuromuscular disorder (HCC)    neuropathy legs   Psoriasis    Vitreous floater    left eye, sees Dr. Oren Bracket at [facility]     Past Surgical History:  Procedure Laterality Date   BASAL CELL CARCINOMA EXCISION     removed from scalp   COLONOSCOPY  07/03/2017   per Dr. Adela Lank, sessile serrated polyp, repeat in 5 yrs    HERNIA REPAIR     LEFT HEART CATH AND CORONARY ANGIOGRAPHY N/A 01/03/2021   Procedure: LEFT HEART CATH AND CORONARY ANGIOGRAPHY;  Surgeon: Lennette Bihari, MD;  Location: [facility];   Service: Cardiovascular;  Laterality: N/A;   RIGHT HEART CATH N/A 01/03/2021   Procedure: RIGHT HEART CATH;  Surgeon: Dolores Patty, MD;  Location: [facility];  Service: Cardiovascular;  Laterality: N/A;    Family History  Problem Relation Age of Onset   Hypertension Mother    Hyperlipidemia Mother    Depression Other    Diabetes Other    Hyperlipidemia Other    Hypertension Other    Alzheimer's disease Other    Colon cancer Neg Hx    Stomach cancer Neg Hx    Rectal cancer Neg Hx    Esophageal cancer Neg Hx     Social History   Socioeconomic History   Marital status: Single    Spouse name: Not on file   Number of children: 0   Years of education: Not on file   Highest education level: Master's degree (e.g., MA, MS, MEng, MEd, MSW, MBA)  Occupational History   Occupation: retired    Comment: Runner, broadcasting/film/video   Occupation: Careers adviser - [employer]  Tobacco Use   Smoking status: Never    Passive exposure: Past   Smokeless tobacco: Never  Vaping Use   Vaping status: Never Used  Substance and Sexual Activity   Alcohol use: No    Alcohol/week: 0.0 standard drinks of alcohol   Drug use: No   Sexual activity: Not on file  Other Topics Concern   Not on file  Social History Narrative   Lives alone--[facility] [place]      Has living will   Health care POA-- friend Trey Paula   Would accept resuscitation--but no prolonged machines or tube feedings   Social Determinants of Health   Financial Resource Strain: Low Risk  (02/15/2023)   Overall Financial Resource Strain (CARDIA)    Difficulty of Paying Living Expenses: Not hard at all  Food Insecurity: No Food Insecurity (02/15/2023)   Hunger Vital Sign    Worried About Running Out of Food in the Last Year: Never true    Ran Out of Food in the Last Year: Never true  Transportation Needs: No Transportation Needs (02/15/2023)   PRAPARE - Administrator, Civil Service (Medical): No    Lack of Transportation (Non-Medical):  No  Physical Activity: Sufficiently Active (02/15/2023)   Exercise Vital Sign    Days of Exercise per Week: 7 days    Minutes of Exercise per Session: 30 min  Stress: Stress Concern Present (02/15/2023)   [employer] of Occupational Health - Occupational Stress Questionnaire  Feeling of Stress : To some extent  Social Connections: Unknown (02/15/2023)   Social Connection and Isolation Panel [NHANES]    Frequency of Communication with Friends and Family: More than three times a week    Frequency of Social Gatherings with Friends and Family: Twice a week    Attends Religious Services: Patient declined    Database administrator or Organizations: Yes    Attends Engineer, structural: More than 4 times per year    Marital Status: Never married  Recent Concern: Social Connections - Moderately Isolated (12/15/2022)   Social Connection and Isolation Panel [NHANES]    Frequency of Communication with Friends and Family: More than three times a week    Frequency of Social Gatherings with Friends and Family: More than three times a week    Attends Religious Services: Never    Database administrator or Organizations: Yes    Attends Engineer, structural: More than 4 times per year    Marital Status: Never married  Intimate Partner Violence: Not At Risk (12/15/2022)   Humiliation, Afraid, Rape, and Kick questionnaire    Fear of Current or Ex-Partner: No    Emotionally Abused: No    Physically Abused: No    Sexually Abused: No   Review of Systems  Constitutional:  Positive for fatigue. Negative for unexpected weight change.       Wears seat belt  HENT:  Positive for tinnitus. Negative for dental problem.        Still hasn't seen audiologist Keeps up with dentist  Eyes:  Negative for visual disturbance.       No diplopia or unilateral vision loss Slight progression with cataracts lately  Respiratory:  Positive for cough. Negative for chest tightness and shortness of breath.    Cardiovascular:  Negative for palpitations and leg swelling.  Gastrointestinal:  Negative for blood in stool and constipation.  Endocrine: Negative for polydipsia and polyuria.  Genitourinary:        Urine slower at night Occasional dribbling--if he holds urine Libido is down -but no partner at present  Musculoskeletal:  Positive for arthralgias. Negative for back pain and joint swelling.       Uses tizanidine sporadically  Skin:  Negative for rash.       Does see derm regularly  Allergic/Immunologic: Negative for environmental allergies and immunocompromised state.  Neurological:  Positive for dizziness. Negative for syncope and light-headedness.       Some balance problems Occasional headaches--frontal  Hematological:  Negative for adenopathy. Bruises/bleeds easily.  Psychiatric/Behavioral:         Sleeps okay with lorazepam       Objective:   Physical Exam Constitutional:      Appearance: Normal appearance.  HENT:     Mouth/Throat:     Pharynx: No oropharyngeal exudate or posterior oropharyngeal erythema.  Eyes:     Conjunctiva/sclera: Conjunctivae normal.     Pupils: Pupils are equal, round, and reactive to light.  Cardiovascular:     Rate and Rhythm: Normal rate and regular rhythm.     Pulses: Normal pulses.     Heart sounds: No murmur heard.    No gallop.  Pulmonary:     Effort: Pulmonary effort is normal.     Breath sounds: Normal breath sounds. No wheezing or rales.  Abdominal:     Palpations: Abdomen is soft.     Tenderness: There is no abdominal tenderness.  Musculoskeletal:     Cervical  back: Neck supple.     Right lower leg: No edema.     Left lower leg: No edema.  Lymphadenopathy:     Cervical: No cervical adenopathy.  Skin:    Findings: No lesion or rash.     Comments: No foot lesions  Neurological:     General: No focal deficit present.     Mental Status: He is alert and oriented to person, place, and time.     Comments: Slightly decreased  sensation in plantar feet  Psychiatric:        Mood and Affect: Mood normal.        Behavior: Behavior normal.            Assessment & Plan:

## 2023-06-18 NOTE — Assessment & Plan Note (Signed)
Compensated on entresto 24/26 bid, farxiga 10, carvedilol 3/125 bid

## 2023-06-18 NOTE — Assessment & Plan Note (Signed)
Seems well controlled with diet and farxiga

## 2023-06-18 NOTE — Assessment & Plan Note (Signed)
Stable dose--lower than his max PDMP okay

## 2023-06-18 NOTE — Assessment & Plan Note (Signed)
Due for repeat CT

## 2023-06-18 NOTE — Assessment & Plan Note (Signed)
Rare angina On rosuvastatin and CHF meds and ASA

## 2023-06-18 NOTE — Assessment & Plan Note (Signed)
Is on the valsartan

## 2023-06-18 NOTE — Assessment & Plan Note (Signed)
Will stick with the duloxetine--just 30mg  daily Lorazepam 2mg  tid (less if he can get by)

## 2023-06-18 NOTE — Assessment & Plan Note (Signed)
Fairly healthy--can do more exercise Had update flu and COVID Had RSV Update Td in the next few years Consider last colon 2030 No more PSA testing

## 2023-06-19 DIAGNOSIS — N2581 Secondary hyperparathyroidism of renal origin: Secondary | ICD-10-CM | POA: Insufficient documentation

## 2023-06-19 LAB — PARATHYROID HORMONE, INTACT (NO CA): PTH: 111 pg/mL — ABNORMAL HIGH (ref 16–77)

## 2023-06-19 NOTE — Assessment & Plan Note (Signed)
PTH elevated Will make sure he is taking vitamin D

## 2023-06-22 ENCOUNTER — Encounter: Payer: Medicare PPO | Admitting: Physical Therapy

## 2023-06-24 DIAGNOSIS — C44612 Basal cell carcinoma of skin of right upper limb, including shoulder: Secondary | ICD-10-CM | POA: Diagnosis not present

## 2023-06-24 DIAGNOSIS — L821 Other seborrheic keratosis: Secondary | ICD-10-CM | POA: Diagnosis not present

## 2023-06-24 DIAGNOSIS — L820 Inflamed seborrheic keratosis: Secondary | ICD-10-CM | POA: Diagnosis not present

## 2023-06-24 DIAGNOSIS — L814 Other melanin hyperpigmentation: Secondary | ICD-10-CM | POA: Diagnosis not present

## 2023-06-24 DIAGNOSIS — Z85828 Personal history of other malignant neoplasm of skin: Secondary | ICD-10-CM | POA: Diagnosis not present

## 2023-06-24 DIAGNOSIS — L570 Actinic keratosis: Secondary | ICD-10-CM | POA: Diagnosis not present

## 2023-06-29 ENCOUNTER — Ambulatory Visit: Payer: Self-pay

## 2023-06-29 DIAGNOSIS — H2512 Age-related nuclear cataract, left eye: Secondary | ICD-10-CM | POA: Diagnosis not present

## 2023-06-29 DIAGNOSIS — Z0100 Encounter for examination of eyes and vision without abnormal findings: Secondary | ICD-10-CM | POA: Diagnosis not present

## 2023-06-29 DIAGNOSIS — H538 Other visual disturbances: Secondary | ICD-10-CM | POA: Diagnosis not present

## 2023-06-29 NOTE — Telephone Encounter (Addendum)
Chief Complaint: Medication concerns Symptoms: Anxiety, depression Frequency: chronic ongoing  Pertinent Negatives: Patient denies thoughts of self harm or harms Disposition: [] ED /[] Urgent Care (no appt availability in office) / [] Appointment(In office/virtual)/ []  [facility]/ [x] Home Care/ [] Refused Recommended Disposition /[] [facility]/ []  Follow-up with PCP Additional Notes: Patient states he has had several disagreements with his PCP about the medication doses he should be taking for anxiety and depression. Patient states that PCP makes him feel uncomfortable when discussing his mental health needs and treats him like he is addicted to his medication. Patient states he doesn't understanding why when he is requesting to take a lower dose than what his PCP is recommending. Patient states he would like to see someone who specializes in mental health treatment that can manage his medication as well. A psychology referral is noted in the chart but patient states it is with a provider outside of [place] and would prefer a [facility] provider if possible. Care advice was given and resources for urgent care behavioral health and Behavorial health appointment line given to patient over the phone and patient requested it be sent via MyChart. Patient does not want to speak to PCP about the matter.  Summary: Medication questions   Pt called the community line, has questions for a nurse regarding medication refills. Wants it confidential. Is a patient of [facility] Best contact: [phone]     Reason for Disposition  Caller has medicine question only, adult not sick, AND triager answers question  Answer Assessment - Initial Assessment Questions 1. NAME of MEDICINE: "What medicine(s) are you calling about?"     Lorazepam 2 MG and Duloxetine 30 MG 2. QUESTION: "What is your question?" (e.g., double dose of medicine, side effect)     I am having disagreements with my provider  about my medication doses and I would like a  referral for Behavioral Health to discuss with a Psychiatrist.   3. PRESCRIBER: "Who prescribed the medicine?" Reason: if prescribed by specialist, call should be referred to that group.     Dr. Alphonsus Sias 4. SYMPTOMS: "Do you have any symptoms?" If Yes, ask: "What symptoms are you having?"  "How bad are the symptoms (e.g., mild, moderate, severe)     None right now  Protocols used: Medication Question Call-A-AH

## 2023-07-01 ENCOUNTER — Encounter: Payer: Self-pay | Admitting: Ophthalmology

## 2023-07-01 ENCOUNTER — Ambulatory Visit
Admission: RE | Admit: 2023-07-01 | Discharge: 2023-07-01 | Disposition: A | Discharge status: Home / Self Care | Payer: Medicare PPO | Source: Ambulatory Visit | Attending: Internal Medicine | Admitting: Internal Medicine

## 2023-07-01 DIAGNOSIS — S2222XA Fracture of body of sternum, initial encounter for closed fracture: Secondary | ICD-10-CM | POA: Diagnosis not present

## 2023-07-01 DIAGNOSIS — R918 Other nonspecific abnormal finding of lung field: Secondary | ICD-10-CM | POA: Diagnosis not present

## 2023-07-01 DIAGNOSIS — I700 Atherosclerosis of aorta: Secondary | ICD-10-CM | POA: Diagnosis not present

## 2023-07-01 DIAGNOSIS — R911 Solitary pulmonary nodule: Secondary | ICD-10-CM | POA: Diagnosis not present

## 2023-07-02 ENCOUNTER — Encounter: Payer: Self-pay | Admitting: Ophthalmology

## 2023-07-02 DIAGNOSIS — N1831 Chronic kidney disease, stage 3a: Secondary | ICD-10-CM | POA: Diagnosis not present

## 2023-07-02 DIAGNOSIS — R011 Cardiac murmur, unspecified: Secondary | ICD-10-CM | POA: Diagnosis not present

## 2023-07-02 DIAGNOSIS — I25119 Atherosclerotic heart disease of native coronary artery with unspecified angina pectoris: Secondary | ICD-10-CM | POA: Diagnosis not present

## 2023-07-02 DIAGNOSIS — K219 Gastro-esophageal reflux disease without esophagitis: Secondary | ICD-10-CM | POA: Diagnosis not present

## 2023-07-02 DIAGNOSIS — N529 Male erectile dysfunction, unspecified: Secondary | ICD-10-CM | POA: Diagnosis not present

## 2023-07-02 DIAGNOSIS — M1990 Unspecified osteoarthritis, unspecified site: Secondary | ICD-10-CM | POA: Diagnosis not present

## 2023-07-02 DIAGNOSIS — E785 Hyperlipidemia, unspecified: Secondary | ICD-10-CM | POA: Diagnosis not present

## 2023-07-02 DIAGNOSIS — N400 Enlarged prostate without lower urinary tract symptoms: Secondary | ICD-10-CM | POA: Diagnosis not present

## 2023-07-02 DIAGNOSIS — I252 Old myocardial infarction: Secondary | ICD-10-CM | POA: Diagnosis not present

## 2023-07-02 DIAGNOSIS — F419 Anxiety disorder, unspecified: Secondary | ICD-10-CM | POA: Diagnosis not present

## 2023-07-02 NOTE — Anesthesia Preprocedure Evaluation (Addendum)
Anesthesia Evaluation  Patient identified by MRN, date of birth, ID band Patient awake    Reviewed: Allergy & Precautions, NPO status , Patient's Chart, lab work & pertinent test results  History of Anesthesia Complications Negative for: history of anesthetic complications  Airway Mallampati: II  TM Distance: >3 FB Neck ROM: Full    Dental no notable dental hx. (+) Teeth Intact   Pulmonary neg pulmonary ROS, neg sleep apnea, neg COPD, Patient abstained from smoking.Not current smoker   Pulmonary exam normal breath sounds clear to auscultation       Cardiovascular Exercise Tolerance: Good METShypertension, + CAD, + Past MI, + Cardiac Stents and +CHF  (-) dysrhythmias  Rhythm:Regular Rate:Normal - Systolic murmurs 16-10-96 ST elevation myocardial infarction involving right coronary artery (HCC) [I21.11 (ICD-10-CM)] Cardiogenic shock (HCC) [R57.0 (ICD-10-CM)] VF (ventricular fibrillation) (HCC) [I49.01 (ICD-10-CM)] Bradycardia [R00.1 (ICD-10-CM)]  -STEMI/VF Arrest  -PCI RCA 12/2020 -50% pLAD -negative PET MPI 01/28/2023 2. Systolic HF -EF 25-30% 12/2020 -EF 55-60% 06/2022  S/P cardiac stent placement 2022  07-07-22  1. Left ventricular ejection fraction, by estimation, is 55 to 60%. The  left ventricle has normal function. The left ventricle has no regional  wall motion abnormalities. Left ventricular diastolic parameters were  normal.   2. Right ventricular systolic function is normal. The right ventricular  size is mildly enlarged. There is normal pulmonary artery systolic  pressure.   3. Right atrial size was mildly dilated.   4. The mitral valve is normal in structure. No evidence of mitral valve  regurgitation. No evidence of mitral stenosis.   5. The aortic valve is tricuspid. Aortic valve regurgitation is not  visualized. Aortic valve sclerosis is present, with no evidence of aortic  valve stenosis.   6. The  inferior vena cava is normal in size with greater than 50%  respiratory variability, suggesting right atrial pressure of 3 mmHg.     Neuro/Psych  PSYCHIATRIC DISORDERS Anxiety Depression    Hx anoxic brain injury per office notes negative neurological ROS     GI/Hepatic ,GERD  ,,(+)     (-) substance abuse    Endo/Other  diabetes    Renal/GU CRFRenal disease     Musculoskeletal   Abdominal   Peds  Hematology   Anesthesia Other Findings Anxiety  GERD (gastroesophageal reflux disease) Hyperlipidemia  Hypertension ED (erectile dysfunction)  Kidney stone Vitreous floater  Arthritis Cancer  Depression Anoxic brain injury (HCC)  Psoriasis  Myocardial infarction (HCC) Coronary artery disease  CHF (congestive heart failure) (HCC) Chronic systolic heart failure (HCC)     Reproductive/Obstetrics                             Anesthesia Physical Anesthesia Plan  ASA: 3  Anesthesia Plan: MAC   Post-op Pain Management:    Induction: Intravenous  PONV Risk Score and Plan: 1 and Midazolam  Airway Management Planned: Nasal Cannula  Additional Equipment:   Intra-op Plan:   Post-operative Plan:   Informed Consent: I have reviewed the patients History and Physical, chart, labs and discussed the procedure including the risks, benefits and alternatives for the proposed anesthesia with the patient or authorized representative who has indicated his/her understanding and acceptance.       Plan Discussed with: CRNA and Surgeon  Anesthesia Plan Comments: (Explained risks of anesthesia, including PONV, and rare emergencies such as cardiac events, respiratory problems, and allergic reactions, requiring invasive intervention. Discussed the role  of CRNA in patient's perioperative care. Patient understands. )       Anesthesia Quick Evaluation

## 2023-07-06 ENCOUNTER — Telehealth: Payer: Self-pay | Admitting: Internal Medicine

## 2023-07-06 ENCOUNTER — Other Ambulatory Visit: Payer: Self-pay | Admitting: Family Medicine

## 2023-07-06 DIAGNOSIS — M79604 Pain in right leg: Secondary | ICD-10-CM

## 2023-07-06 NOTE — Telephone Encounter (Signed)
Ok with me, thanks.

## 2023-07-06 NOTE — Telephone Encounter (Signed)
Last office visit 06/18/2023 for CPE with Dr. Alphonsus Sias.  Last refilled 02/18/2023 for #30 with 1 refill by Dr. Patsy Lager.  Next Appt: No future appointments with PCP or Dr. Patsy Lager.

## 2023-07-06 NOTE — Telephone Encounter (Signed)
Patient would like to transfer from Dr. Tillman Abide to Dr. Oliver Barre. Are both providers okay with this transition? Best callback is [phone].

## 2023-07-07 ENCOUNTER — Encounter: Payer: Self-pay | Admitting: Internal Medicine

## 2023-07-07 MED ORDER — DULOXETINE HCL 40 MG PO CPEP: 40.0000 mg | ORAL_CAPSULE | Freq: Every day | ORAL | 5 refills | Status: DC

## 2023-07-07 MED ORDER — LORAZEPAM 2 MG PO TABS: 1.0000 mg | ORAL_TABLET | Freq: Three times a day (TID) | ORAL | 0 refills | Status: DC | PRN

## 2023-07-08 DIAGNOSIS — H2512 Age-related nuclear cataract, left eye: Secondary | ICD-10-CM | POA: Diagnosis not present

## 2023-07-09 ENCOUNTER — Ambulatory Visit: Payer: Medicare PPO | Admitting: Internal Medicine

## 2023-07-09 ENCOUNTER — Encounter: Payer: Self-pay | Admitting: Internal Medicine

## 2023-07-09 VITALS — BP 122/76 | HR 64 | Temp 98.6°F | Ht 72.0 in | Wt 185.0 lb

## 2023-07-09 DIAGNOSIS — E119 Type 2 diabetes mellitus without complications: Secondary | ICD-10-CM

## 2023-07-09 DIAGNOSIS — E538 Deficiency of other specified B group vitamins: Secondary | ICD-10-CM | POA: Diagnosis not present

## 2023-07-09 DIAGNOSIS — I10 Essential (primary) hypertension: Secondary | ICD-10-CM

## 2023-07-09 DIAGNOSIS — N1832 Chronic kidney disease, stage 3b: Secondary | ICD-10-CM | POA: Diagnosis not present

## 2023-07-09 DIAGNOSIS — Z125 Encounter for screening for malignant neoplasm of prostate: Secondary | ICD-10-CM

## 2023-07-09 DIAGNOSIS — N2581 Secondary hyperparathyroidism of renal origin: Secondary | ICD-10-CM | POA: Diagnosis not present

## 2023-07-09 DIAGNOSIS — E559 Vitamin D deficiency, unspecified: Secondary | ICD-10-CM | POA: Diagnosis not present

## 2023-07-09 DIAGNOSIS — F39 Unspecified mood [affective] disorder: Secondary | ICD-10-CM

## 2023-07-09 DIAGNOSIS — H9193 Unspecified hearing loss, bilateral: Secondary | ICD-10-CM | POA: Diagnosis not present

## 2023-07-09 DIAGNOSIS — Z7984 Long term (current) use of oral hypoglycemic drugs: Secondary | ICD-10-CM

## 2023-07-09 NOTE — Progress Notes (Signed)
Patient ID: [name], male   DOB: 1951-06-07, 72 y.o.   MRN: [id number]        Chief Complaint: new pt transfer, anxiety, low vit d, elevated PTH, bilateral hearing loss, dm, htn, ckd3a       HPI:  [name] is a 72 y.o. male here with c/o ongoing anxiety, and no longer seeing prior provider as he wanted to maximize the cymbalta with increase to 40 mg instead of higher than recommended maximum xanax prn.  Denies worsening depressive symptoms, suicidal ideation, or panic; has ongoing anxiety, Pt denies chest pain, increased sob or doe, wheezing, orthopnea, PND, increased LE swelling, palpitations, dizziness or syncope.   Pt denies polydipsia, polyuria, or new focal neuro s/s.    Pt denies fever, wt loss, night sweats, loss of appetite, or other constitutional symptoms  Does have worsening bilateral hearing loss.  Also has hx of elev PTH in the setting of ckd, but also has lower Vit D.         Wt Readings from Last 3 Encounters:  07/09/23 185 lb (83.9 kg)  06/18/23 181 lb (82.1 kg)  05/11/23 183 lb (83 kg)   BP Readings from Last 3 Encounters:  07/09/23 122/76  06/18/23 128/84  05/11/23 130/84         Past Medical History:  Diagnosis Date   Anoxic brain injury (HCC)    Anxiety    Arthritis    Cancer (HCC) 2010   basal cell ca, head   Cataract    bilateral   CHF (congestive heart failure) (HCC)    Chronic systolic heart failure (HCC)    Coronary artery disease    Depression    ED (erectile dysfunction)    GERD (gastroesophageal reflux disease)    Heart failure (HCC)    Hyperlipidemia    Hypertension    Kidney stone 07/29/2011   passed    Myocardial infarction [facility])    Neuromuscular disorder (HCC)    neuropathy legs   Psoriasis    Vitreous floater    left eye, sees Dr. Oren Bracket at [facility]    Past Surgical History:  Procedure Laterality Date   BASAL CELL CARCINOMA EXCISION     removed from scalp   COLONOSCOPY  07/03/2017   per Dr. Adela Lank,  sessile serrated polyp, repeat in 5 yrs    HERNIA REPAIR     LEFT HEART CATH AND CORONARY ANGIOGRAPHY N/A 01/03/2021   Procedure: LEFT HEART CATH AND CORONARY ANGIOGRAPHY;  Surgeon: Lennette Bihari, MD;  [facility]: [facility];  Service: Cardiovascular;  Laterality: N/A;   RIGHT HEART CATH N/A 01/03/2021   Procedure: RIGHT HEART CATH;  Surgeon: Dolores Patty, MD;  Location: [facility];  Service: Cardiovascular;  Laterality: N/A;    reports that he has never smoked. He has been exposed to tobacco smoke. He has never used smokeless tobacco. He reports that he does not drink alcohol and does not use drugs. family history includes Alzheimer's disease in an other family member; Depression in an other family member; Diabetes in an other family member; Hyperlipidemia in his mother and another family member; Hypertension in his mother and another family member. Allergies  Allergen Reactions   Codeine Other (See Comments)    hallucinations   Lisinopril Cough   Nsaids     Not supposed to take because of kidneys    Current Outpatient Medications on File Prior to Visit  Medication Sig Dispense Refill  aspirin 81 MG chewable tablet Chew 1 tablet (81 mg total) by mouth daily. 90 tablet 0   carvedilol (COREG) 3.125 MG tablet TAKE ONE TABLET BY MOUTH TWICE DAILY 180 tablet 3   Cholecalciferol (VITAMIN D) 50 MCG (2000 UT) CAPS Take 1 capsule by mouth daily.     diclofenac Sodium (VOLTAREN) 1 % GEL Apply 2 g topically 2 (two) times daily as needed (pain).     DULoxetine HCl 40 MG CPEP Take 1 capsule (40 mg total) by mouth daily. 30 capsule 5   ENTRESTO 24-26 MG TAKE ONE TABLET by MOUTH twice daily 60 tablet 11   FARXIGA 10 MG TABS tablet TAKE ONE TABLET BY MOUTH BEFORE BREAKFAST 90 tablet 3   finasteride ([name]) 5 MG tablet Take 1 tablet (5 mg total) by mouth daily. 90 tablet 3   ketoconazole (NIZORAL) 2 % cream Apply 1 application topically daily as needed for irritation.      LORazepam (ATIVAN) 2 MG tablet Take 0.5-1 tablets (1-2 mg total) by mouth 3 (three) times daily as needed. for anxiety 90 tablet 0   melatonin 3 MG TABS tablet Take 1 tablet by mouth daily.     nitroGLYCERIN (NITROSTAT) 0.4 MG SL tablet Place 1 tablet (0.4 mg total) under the tongue every 5 (five) minutes as needed for chest pain. 50 tablet 3   omeprazole (PRILOSEC) 20 MG capsule Take 1 capsule (20 mg total) by mouth daily as needed. (Patient taking differently: Take 20 mg by mouth daily.) 90 capsule 3   rosuvastatin (CRESTOR) 10 MG tablet TAKE ONE TABLET BY MOUTH ONCE DAILY 90 tablet 3   sildenafil (REVATIO) 20 MG tablet Take 20 mg by mouth as needed.     tiZANidine (ZANAFLEX) 4 MG tablet TAKE 1 TABLET BY MOUTH AT BEDTIME 30 tablet 1   triamcinolone cream (KENALOG) 0.1 % Apply 1 application topically 2 (two) times daily as needed (psoriasis).     [DISCONTINUED] tadalafil (CIALIS) 20 MG tablet Take 20 mg by mouth daily as needed.       No current facility-administered medications on file prior to visit.        ROS:  All others reviewed and negative.  Objective        PE:  BP 122/76 (BP Location: Right Arm, Patient Position: Sitting, Cuff Size: Normal)   Pulse 64   Temp 98.6 F (37 C) (Oral)   Ht 6' (1.829 m)   Wt 185 lb (83.9 kg)   SpO2 99%   BMI 25.09 kg/m                 Constitutional: Pt appears in NAD               HENT: Head: NCAT.                Right Ear: External ear normal.                 Left Ear: External ear normal.                Eyes: . Pupils are equal, round, and reactive to light. Conjunctivae and EOM are normal               Nose: without d/c or deformity               Neck: Neck supple. Gross normal ROM               Cardiovascular: Normal rate and  regular rhythm.                 Pulmonary/Chest: Effort normal and breath sounds without rales or wheezing.                Abd:  Soft, NT, ND, + BS, no organomegaly               Neurological: Pt is alert. At  baseline orientation, motor grossly intact               Skin: Skin is warm. No rashes, no other new lesions, LE edema - none               Psychiatric: Pt behavior is normal without agitation   Micro: none  Cardiac tracings I have personally interpreted today:  none  Pertinent Radiological findings (summarize): none   Lab Results  Component Value Date   WBC 5.7 06/18/2023   HGB 15.6 06/18/2023   HCT 48.4 06/18/2023   PLT 177.0 06/18/2023   GLUCOSE 98 06/18/2023   CHOL 143 06/18/2023   TRIG 87.0 06/18/2023   HDL 52.00 06/18/2023   LDLDIRECT 141.9 04/06/2013   LDLCALC 74 06/18/2023   ALT 22 06/18/2023   AST 20 06/18/2023   NA 138 06/18/2023   K 4.2 06/18/2023   CL 103 06/18/2023   CREATININE 1.37 06/18/2023   BUN 32 (H) 06/18/2023   CO2 28 06/18/2023   TSH 1.390 11/26/2022   PSA 0.65 05/30/2021   INR 1.5 (H) 01/03/2021   HGBA1C 5.6 06/18/2023   MICROALBUR 2.1 (H) 06/18/2023   Assessment/Plan:  [name] is a 71 y.o. White or Caucasian [1] male with  has a past medical history of Anoxic brain injury (HCC), Anxiety, Arthritis, Cancer (HCC) (2010), Cataract, CHF (congestive heart failure) (HCC), Chronic systolic heart failure (HCC), Coronary artery disease, Depression, ED (erectile dysfunction), GERD (gastroesophageal reflux disease), Heart failure (HCC), Hyperlipidemia, Hypertension, Kidney stone (07/29/2011), Myocardial infarction [facility]), Neuromuscular disorder (HCC), Psoriasis, and Vitreous floater.  Hearing loss With recent worsening, for audiology referral  Stage 3b chronic kidney disease (HCC) Lab Results  Component Value Date   CREATININE 1.37 06/18/2023   Stable overall, cont to avoid nephrotoxins  Secondary hyperparathyroidism, renal (HCC) Also for f/u PTH with calcium  Essential hypertension BP Readings from Last 3 Encounters:  07/09/23 122/76  06/18/23 128/84  05/11/23 130/84   Stable, pt to continue medical treatment coreg 3.125 bid,     Diabetes mellitus treated with oral medication (HCC) Lab Results  Component Value Date   HGBA1C 5.6 06/18/2023   Stable, pt to continue current medical treatment farxiga 10 qd   Mood disorder (HCC) I agree with previous provider regarding increased duloxetin 40 every day, consider psychiatry referral, needs to minimize benzo due to hx of dependence   Vitamin D deficiency Last vitamin D Lab Results  Component Value Date   VD25OH 35.84 06/18/2023   Low, for increased oral replacement to 4000 u qd  Followup: Return in about 6 months (around 01/07/2024).  Oliver Barre, MD 07/12/2023 3:35 PM [facility]

## 2023-07-09 NOTE — Discharge Instructions (Signed)

## 2023-07-09 NOTE — Patient Instructions (Addendum)
Ok to continue the duloxetine ER at 40 mg, and the ativan as prescribed  Ok to let me know by Mychart message if you need refills, or by requesting from the pharmacy  Please take OTC Vitamin D3 at 4000 units per day, indefinitely  Please continue all other medications as before, and refills have been done if requested.  Please have the pharmacy call with any other refills you may need.  Please keep your appointments with your specialists as you may have planned  You will be contacted regarding the referral for: Audiology  Please make an Appointment to return in 6 months, or sooner if needed, also with Lab Appointment for testing done 3-5 days before at the FIRST FLOOR Lab (so this is for TWO appointments - please see the scheduling desk as you leave)  ...... OR you can just go without appt to the LAB at [address]

## 2023-07-12 ENCOUNTER — Encounter: Payer: Self-pay | Admitting: Internal Medicine

## 2023-07-12 DIAGNOSIS — E559 Vitamin D deficiency, unspecified: Secondary | ICD-10-CM | POA: Insufficient documentation

## 2023-07-12 NOTE — Assessment & Plan Note (Signed)
Lab Results  Component Value Date   CREATININE 1.37 06/18/2023   Stable overall, cont to avoid nephrotoxins

## 2023-07-12 NOTE — Assessment & Plan Note (Signed)
Also for f/u PTH with calcium

## 2023-07-12 NOTE — Assessment & Plan Note (Signed)
With recent worsening, for audiology referral

## 2023-07-12 NOTE — Assessment & Plan Note (Signed)
Lab Results  Component Value Date   HGBA1C 5.6 06/18/2023   Stable, pt to continue current medical treatment farxiga 10 qd

## 2023-07-12 NOTE — Assessment & Plan Note (Signed)
BP Readings from Last 3 Encounters:  07/09/23 122/76  06/18/23 128/84  05/11/23 130/84   Stable, pt to continue medical treatment coreg 3.125 bid,

## 2023-07-12 NOTE — Assessment & Plan Note (Signed)
Last vitamin D Lab Results  Component Value Date   VD25OH 35.84 06/18/2023   Low, for increased oral replacement to 4000 u qd

## 2023-07-12 NOTE — Assessment & Plan Note (Signed)
I agree with previous provider regarding increased duloxetin 40 every day, consider psychiatry referral, needs to minimize benzo due to hx of dependence

## 2023-07-13 ENCOUNTER — Ambulatory Visit: Payer: Medicare PPO | Admitting: Anesthesiology

## 2023-07-13 ENCOUNTER — Encounter
Admission: RE | Disposition: A | Discharge status: Home / Self Care | Payer: Self-pay | Source: Home / Self Care | Attending: Ophthalmology

## 2023-07-13 ENCOUNTER — Other Ambulatory Visit: Payer: Self-pay

## 2023-07-13 ENCOUNTER — Encounter: Payer: Self-pay | Admitting: Ophthalmology

## 2023-07-13 ENCOUNTER — Ambulatory Visit
Admission: RE | Admit: 2023-07-13 | Discharge: 2023-07-13 | Disposition: A | Discharge status: Home / Self Care | Payer: Medicare PPO | Attending: Ophthalmology | Admitting: Ophthalmology

## 2023-07-13 DIAGNOSIS — E1136 Type 2 diabetes mellitus with diabetic cataract: Secondary | ICD-10-CM | POA: Diagnosis not present

## 2023-07-13 DIAGNOSIS — I5022 Chronic systolic (congestive) heart failure: Secondary | ICD-10-CM | POA: Insufficient documentation

## 2023-07-13 DIAGNOSIS — I110 Hypertensive heart disease with heart failure: Secondary | ICD-10-CM | POA: Insufficient documentation

## 2023-07-13 DIAGNOSIS — E1122 Type 2 diabetes mellitus with diabetic chronic kidney disease: Secondary | ICD-10-CM | POA: Diagnosis not present

## 2023-07-13 DIAGNOSIS — H2512 Age-related nuclear cataract, left eye: Secondary | ICD-10-CM | POA: Diagnosis not present

## 2023-07-13 DIAGNOSIS — Z955 Presence of coronary angioplasty implant and graft: Secondary | ICD-10-CM | POA: Diagnosis not present

## 2023-07-13 DIAGNOSIS — I2510 Atherosclerotic heart disease of native coronary artery without angina pectoris: Secondary | ICD-10-CM | POA: Diagnosis not present

## 2023-07-13 DIAGNOSIS — I130 Hypertensive heart and chronic kidney disease with heart failure and stage 1 through stage 4 chronic kidney disease, or unspecified chronic kidney disease: Secondary | ICD-10-CM | POA: Diagnosis not present

## 2023-07-13 DIAGNOSIS — N1832 Chronic kidney disease, stage 3b: Secondary | ICD-10-CM | POA: Diagnosis not present

## 2023-07-13 HISTORY — DX: Chronic systolic (congestive) heart failure: I50.22

## 2023-07-13 HISTORY — PX: CATARACT EXTRACTION W/PHACO: SHX586

## 2023-07-13 LAB — GLUCOSE, CAPILLARY: Glucose-Capillary: 96 mg/dL (ref 70–99)

## 2023-07-13 SURGERY — PHACOEMULSIFICATION, CATARACT, WITH IOL INSERTION
Anesthesia: Monitor Anesthesia Care | Laterality: Left

## 2023-07-13 MED ORDER — ARMC OPHTHALMIC DILATING DROPS
OPHTHALMIC | Status: AC
  Filled 2023-07-13: qty 0.5

## 2023-07-13 MED ORDER — MOXIFLOXACIN HCL 0.5 % OP SOLN
OPHTHALMIC | Status: DC | PRN
  Administered 2023-07-13: .2 mL via OPHTHALMIC

## 2023-07-13 MED ORDER — FENTANYL CITRATE (PF) 100 MCG/2ML IJ SOLN
INTRAMUSCULAR | Status: AC
  Filled 2023-07-13: qty 2

## 2023-07-13 MED ORDER — SIGHTPATH DOSE#1 BSS IO SOLN
INTRAOCULAR | Status: DC | PRN
  Administered 2023-07-13: 87 mL via OPHTHALMIC

## 2023-07-13 MED ORDER — ARMC OPHTHALMIC DILATING DROPS
1.0000 | OPHTHALMIC | Status: DC | PRN
  Administered 2023-07-13 (×3): 1 via OPHTHALMIC

## 2023-07-13 MED ORDER — SIGHTPATH DOSE#1 BSS IO SOLN
INTRAOCULAR | Status: DC | PRN
  Administered 2023-07-13: 15 mL via INTRAOCULAR

## 2023-07-13 MED ORDER — FENTANYL CITRATE (PF) 100 MCG/2ML IJ SOLN
INTRAMUSCULAR | Status: DC | PRN
  Administered 2023-07-13: 100 ug via INTRAVENOUS

## 2023-07-13 MED ORDER — MIDAZOLAM HCL 2 MG/2ML IJ SOLN
INTRAMUSCULAR | Status: AC
  Filled 2023-07-13: qty 2

## 2023-07-13 MED ORDER — SIGHTPATH DOSE#1 NA HYALUR & NA CHOND-NA HYALUR IO KIT
PACK | INTRAOCULAR | Status: DC | PRN
  Administered 2023-07-13: 1 via OPHTHALMIC

## 2023-07-13 MED ORDER — TETRACAINE HCL 0.5 % OP SOLN
1.0000 [drp] | OPHTHALMIC | Status: DC | PRN
  Administered 2023-07-13 (×3): 1 [drp] via OPHTHALMIC

## 2023-07-13 MED ORDER — LACTATED RINGERS IV SOLN: INTRAVENOUS | Status: DC

## 2023-07-13 MED ORDER — LIDOCAINE HCL (PF) 2 % IJ SOLN
INTRAOCULAR | Status: DC | PRN
  Administered 2023-07-13: 1 mL via INTRAOCULAR

## 2023-07-13 MED ORDER — SODIUM CHLORIDE 0.9% FLUSH: 10.0000 mL | INTRAVENOUS | Status: DC | PRN

## 2023-07-13 MED ORDER — TETRACAINE HCL 0.5 % OP SOLN
OPHTHALMIC | Status: AC
  Filled 2023-07-13: qty 4

## 2023-07-13 MED ORDER — MIDAZOLAM HCL 2 MG/2ML IJ SOLN
INTRAMUSCULAR | Status: DC | PRN
  Administered 2023-07-13: 2 mg via INTRAVENOUS

## 2023-07-13 SURGICAL SUPPLY — 11 items
CATARACT SUITE SIGHTPATH (MISCELLANEOUS) ×1
DISSECTOR HYDRO NUCLEUS 50X22 (MISCELLANEOUS) ×1 IMPLANT
FEE CATARACT SUITE SIGHTPATH (MISCELLANEOUS) ×1 IMPLANT
GLOVE SURG GAMMEX PI TX LF 7.5 (GLOVE) ×1 IMPLANT
GLOVE SURG SYN 8.5 E (GLOVE) ×1
GLOVE SURG SYN 8.5 PF PI (GLOVE) ×1 IMPLANT
LENS IOL TECNIS EYHANCE 25.0 (Intraocular Lens) IMPLANT
NDL FILTER BLUNT 18X1 1/2 (NEEDLE) ×1 IMPLANT
NEEDLE FILTER BLUNT 18X1 1/2 (NEEDLE) ×1
SYR 3ML LL SCALE MARK (SYRINGE) ×1 IMPLANT
SYR 5ML LL (SYRINGE) ×1 IMPLANT

## 2023-07-13 NOTE — Anesthesia Postprocedure Evaluation (Signed)
Anesthesia Post Note  Patient: Adrien Greenaway  Procedure(s) Performed: CATARACT EXTRACTION PHACO AND INTRAOCULAR LENS PLACEMENT (IOC) LEFT DIABETIC 3.20 00:29.8 (Left)  Patient location during evaluation: PACU Anesthesia Type: MAC Level of consciousness: awake and alert Pain management: pain level controlled Vital Signs Assessment: post-procedure vital signs reviewed and stable Respiratory status: spontaneous breathing, nonlabored ventilation, respiratory function stable and patient connected to nasal cannula oxygen Cardiovascular status: stable and blood pressure returned to baseline Postop Assessment: no apparent nausea or vomiting Anesthetic complications: no   No notable events documented.   Last Vitals:  Vitals:   07/13/23 0800 07/13/23 0805  BP: 128/82 131/82  Pulse: 60 (!) 58  Resp: 17 (!) 22  Temp: (!) 36.4 C (!) 36.4 C  SpO2: 97% 100%    Last Pain:  Vitals:   07/13/23 0805  TempSrc:   PainSc: 0-No pain                 Corinda Gubler

## 2023-07-13 NOTE — Transfer of Care (Signed)
Immediate Anesthesia Transfer of Care Note  Patient: [name]  Procedure(s) Performed: CATARACT EXTRACTION PHACO AND INTRAOCULAR LENS PLACEMENT (IOC) LEFT DIABETIC 3.20 00:29.8 (Left)  Patient Location: PACU  Anesthesia Type: MAC  Level of Consciousness: awake, alert  and patient cooperative  Airway and Oxygen Therapy: Patient Spontanous Breathing and Patient connected to supplemental oxygen  Post-op Assessment: Post-op Vital signs reviewed, Patient's Cardiovascular Status Stable, Respiratory Function Stable, Patent Airway and No signs of Nausea or vomiting  Post-op Vital Signs: Reviewed and stable  Complications: No notable events documented.

## 2023-07-13 NOTE — H&P (Signed)
[facility]   Primary Care Physician:  Corwin Levins, MD Ophthalmologist: Dr. Willey Blade  Pre-Procedure History & Physical: HPI:  [name] is a 72 y.o. male here for cataract surgery.   Past Medical History:  Diagnosis Date   Anoxic brain injury (HCC)    Anxiety    Arthritis    Cancer (HCC) 2010   basal cell ca, head   Cataract    bilateral   CHF (congestive heart failure) (HCC)    Chronic systolic heart failure (HCC)    Coronary artery disease    Depression    ED (erectile dysfunction)    GERD (gastroesophageal reflux disease)    Heart failure (HCC)    Hyperlipidemia    Hypertension    Kidney stone 07/29/2011   passed    Myocardial infarction [facility])    Neuromuscular disorder (HCC)    neuropathy legs   Psoriasis    Vitreous floater    left eye, sees Dr. Oren Bracket at [facility]     Past Surgical History:  Procedure Laterality Date   BASAL CELL CARCINOMA EXCISION     removed from scalp   COLONOSCOPY  07/03/2017   per Dr. Adela Lank, sessile serrated polyp, repeat in 5 yrs    HERNIA REPAIR     LEFT HEART CATH AND CORONARY ANGIOGRAPHY N/A 01/03/2021   Procedure: LEFT HEART CATH AND CORONARY ANGIOGRAPHY;  Surgeon: Lennette Bihari, MD;  Location: [facility];  Service: Cardiovascular;  Laterality: N/A;   RIGHT HEART CATH N/A 01/03/2021   Procedure: RIGHT HEART CATH;  Surgeon: Dolores Patty, MD;  Location: [facility];  Service: Cardiovascular;  Laterality: N/A;    Prior to Admission medications   Medication Sig Start Date End Date Taking? Authorizing Provider  aspirin 81 MG chewable tablet Chew 1 tablet (81 mg total) by mouth daily. 03/21/21  Yes Nelwyn Salisbury, MD  carvedilol (COREG) 3.125 MG tablet TAKE ONE TABLET BY MOUTH TWICE DAILY 05/04/23  Yes O'Neal, Ronnald Ramp, MD  Cholecalciferol (VITAMIN D) 50 MCG (2000 UT) CAPS Take 1 capsule by mouth daily.   Yes [provider]  DULoxetine HCl 40 MG CPEP Take 1 capsule (40  mg total) by mouth daily. 07/07/23  Yes Karie Schwalbe, MD  ENTRESTO 24-26 MG TAKE ONE TABLET by MOUTH twice daily 05/04/23  Yes O'Neal, Ronnald Ramp, MD  FARXIGA 10 MG TABS tablet TAKE ONE TABLET BY MOUTH BEFORE BREAKFAST 02/02/23  Yes O'Neal, Ronnald Ramp, MD  finasteride ([name]) 5 MG tablet Take 1 tablet (5 mg total) by mouth daily. 06/11/22  Yes Nelwyn Salisbury, MD  LORazepam (ATIVAN) 2 MG tablet Take 0.5-1 tablets (1-2 mg total) by mouth 3 (three) times daily as needed. for anxiety 07/07/23  Yes Tillman Abide I, MD  melatonin 3 MG TABS tablet Take 1 tablet by mouth daily.   Yes [provider]  rosuvastatin (CRESTOR) 10 MG tablet TAKE ONE TABLET BY MOUTH ONCE DAILY 05/04/23  Yes O'Neal, Ronnald Ramp, MD  tiZANidine (ZANAFLEX) 4 MG tablet TAKE 1 TABLET BY MOUTH AT BEDTIME 07/06/23  Yes Copland, Karleen Hampshire, MD  diclofenac Sodium (VOLTAREN) 1 % GEL Apply 2 g topically 2 (two) times daily as needed (pain).    [provider]  ketoconazole (NIZORAL) 2 % cream Apply 1 application topically daily as needed for irritation.    [provider]  nitroGLYCERIN (NITROSTAT) 0.4 MG SL tablet Place 1 tablet (0.4 mg total) under the  tongue every 5 (five) minutes as needed for chest pain. 02/14/21   Nelwyn Salisbury, MD  omeprazole (PRILOSEC) 20 MG capsule Take 1 capsule (20 mg total) by mouth daily as needed. Patient taking differently: Take 20 mg by mouth daily. 06/18/23   Karie Schwalbe, MD  sildenafil (REVATIO) 20 MG tablet Take 20 mg by mouth as needed.    [provider]  triamcinolone cream (KENALOG) 0.1 % Apply 1 application topically 2 (two) times daily as needed (psoriasis).    [provider]  tadalafil (CIALIS) 20 MG tablet Take 20 mg by mouth daily as needed.    12/17/11  [provider]    Allergies as of 07/01/2023 - Review Complete 07/01/2023  Allergen Reaction Noted   Codeine Other (See Comments) 12/23/2010   Lisinopril Cough 08/15/2019    Nsaids  07/22/2022    Family History  Problem Relation Age of Onset   Hypertension Mother    Hyperlipidemia Mother    Depression Other    Diabetes Other    Hyperlipidemia Other    Hypertension Other    Alzheimer's disease Other    Colon cancer Neg Hx    Stomach cancer Neg Hx    Rectal cancer Neg Hx    Esophageal cancer Neg Hx     Social History   Socioeconomic History   Marital status: Single    Spouse name: Not on file   Number of children: 0   Years of education: Not on file   Highest education level: Master's degree (e.g., MA, MS, MEng, MEd, MSW, MBA)  Occupational History   Occupation: retired    Comment: Runner, broadcasting/film/video   Occupation: Careers adviser - [employer]  Tobacco Use   Smoking status: Never    Passive exposure: Past   Smokeless tobacco: Never  Vaping Use   Vaping status: Never Used  Substance and Sexual Activity   Alcohol use: No    Alcohol/week: 0.0 standard drinks of alcohol   Drug use: No   Sexual activity: Not on file  Other Topics Concern   Not on file  Social History Narrative   Lives alone--[place]      Has living will   Health care POA-- friend Trey Paula   Would accept resuscitation--but no prolonged machines or tube feedings   Social Determinants of Health   Financial Resource Strain: Low Risk  (02/15/2023)   Overall Financial Resource Strain (CARDIA)    Difficulty of Paying Living Expenses: Not hard at all  Food Insecurity: No Food Insecurity (02/15/2023)   Hunger Vital Sign    Worried About Running Out of Food in the Last Year: Never true    Ran Out of Food in the Last Year: Never true  Transportation Needs: No Transportation Needs (02/15/2023)   PRAPARE - Administrator, Civil Service (Medical): No    Lack of Transportation (Non-Medical): No  Physical Activity: Sufficiently Active (02/15/2023)   Exercise Vital Sign    Days of Exercise per Week: 7 days    Minutes of Exercise per Session: 30 min  Stress: Stress Concern Present  (02/15/2023)   [employer] of Occupational Health - Occupational Stress Questionnaire    Feeling of Stress : To some extent  Social Connections: Unknown (02/15/2023)   Social Connection and Isolation Panel [NHANES]    Frequency of Communication with Friends and Family: More than three times a week    Frequency of Social Gatherings with Friends and Family: Twice a week  Attends Religious Services: Patient declined    Active Member of Clubs or Organizations: Yes    Attends Banker Meetings: More than 4 times per year    Marital Status: Never married  Recent Concern: Social Connections - Moderately Isolated (12/15/2022)   Social Connection and Isolation Panel [NHANES]    Frequency of Communication with Friends and Family: More than three times a week    Frequency of Social Gatherings with Friends and Family: More than three times a week    Attends Religious Services: Never    Database administrator or Organizations: Yes    Attends Engineer, structural: More than 4 times per year    Marital Status: Never married  Intimate Partner Violence: Not At Risk (12/15/2022)   Humiliation, Afraid, Rape, and Kick questionnaire    Fear of Current or Ex-Partner: No    Emotionally Abused: No    Physically Abused: No    Sexually Abused: No    Review of Systems: See HPI, otherwise negative ROS  Physical Exam: BP 137/77   Pulse 64   Temp (!) 97 F (36.1 C) (Temporal)   Resp 12   Ht 6' 0.01" (1.829 m)   Wt 83.5 kg   SpO2 98%   BMI 24.95 kg/m  General:   Alert, cooperative in NAD Head:  Normocephalic and atraumatic. Respiratory:  Normal work of breathing. Cardiovascular:  RRR  Impression/Plan: [name] is here for cataract surgery.  Risks, benefits, limitations, and alternatives regarding cataract surgery have been reviewed with the patient.  Questions have been answered.  All parties agreeable.   Willey Blade, MD  07/13/2023, 7:19 AM

## 2023-07-13 NOTE — Plan of Care (Signed)
[facility] Tonsillectomy/Adenoidectomy, Postoperative PEDS care plan entered in error.

## 2023-07-13 NOTE — Op Note (Signed)
OPERATIVE NOTE  [name] [id number] 07/13/2023   PREOPERATIVE DIAGNOSIS:  Nuclear sclerotic cataract left eye.  H25.12   POSTOPERATIVE DIAGNOSIS:    Nuclear sclerotic cataract left eye.     PROCEDURE:  Phacoemusification with posterior chamber intraocular lens placement of the left eye   LENS:   Implant Name Type Inv. Item Serial No. Manufacturer Lot No. LRB No. Used Action  LENS IOL TECNIS EYHANCE 25.0 - [id number] Intraocular Lens LENS IOL TECNIS EYHANCE 25.0 [id number] [employer]  Left 1 Implanted      Procedure(s): CATARACT EXTRACTION PHACO AND INTRAOCULAR LENS PLACEMENT (IOC) LEFT DIABETIC 3.20 00:29.8 (Left)  SURGEON:  Willey Blade, MD, MPH   ANESTHESIA:  Topical with tetracaine drops augmented with 1% preservative-free intracameral lidocaine.  ESTIMATED BLOOD LOSS: <1 mL   COMPLICATIONS:  None.   DESCRIPTION OF PROCEDURE:  The patient was identified in the holding room and transported to the operating room and placed in the supine position under the operating microscope.  The left eye was identified as the operative eye and it was prepped and draped in the usual sterile ophthalmic fashion.   A 1.0 millimeter clear-corneal paracentesis was made at the 5:00 position. 0.5 ml of preservative-free 1% lidocaine with epinephrine was injected into the anterior chamber.  The anterior chamber was filled with viscoelastic.  A 2.4 millimeter keratome was used to make a near-clear corneal incision at the 2:00 position.  A curvilinear capsulorrhexis was made with a cystotome and capsulorrhexis forceps.  Balanced salt solution was used to hydrodissect and hydrodelineate the nucleus.   Phacoemulsification was then used in stop and chop fashion to remove the lens nucleus and epinucleus.  The remaining cortex was then removed using the irrigation and aspiration handpiece. Viscoelastic was then placed into the capsular bag to distend it for lens placement.  A lens was then injected into  the capsular bag.  The remaining viscoelastic was aspirated.   Wounds were hydrated with balanced salt solution.  The anterior chamber was inflated to a physiologic pressure with balanced salt solution.  Intracameral vigamox 0.1 mL undiltued was injected into the eye and a drop placed onto the ocular surface.  No wound leaks were noted.  The patient was taken to the recovery room in stable condition without complications of anesthesia or surgery  Willey Blade 07/13/2023, 7:56 AM

## 2023-07-14 ENCOUNTER — Encounter: Payer: Self-pay | Admitting: Ophthalmology

## 2023-07-14 ENCOUNTER — Ambulatory Visit: Payer: Medicare PPO | Admitting: Clinical

## 2023-07-14 DIAGNOSIS — H2511 Age-related nuclear cataract, right eye: Secondary | ICD-10-CM | POA: Diagnosis not present

## 2023-07-17 ENCOUNTER — Encounter: Payer: Self-pay | Admitting: Internal Medicine

## 2023-07-29 ENCOUNTER — Ambulatory Visit: Payer: Medicare PPO | Admitting: Clinical

## 2023-07-30 NOTE — Discharge Instructions (Signed)

## 2023-07-31 ENCOUNTER — Encounter: Payer: Self-pay | Admitting: Ophthalmology

## 2023-07-31 NOTE — Anesthesia Preprocedure Evaluation (Addendum)
Anesthesia Evaluation  Patient identified by MRN, date of birth, ID band Patient awake    Reviewed: Allergy & Precautions, H&P , NPO status , Patient's Chart, lab work & pertinent test results  Airway Mallampati: II  TM Distance: >3 FB Neck ROM: Full    Dental no notable dental hx.    Pulmonary neg pulmonary ROS   Pulmonary exam normal breath sounds clear to auscultation       Cardiovascular hypertension, + angina  + CAD, + Past MI and +CHF  Normal cardiovascular exam Rhythm:Regular Rate:Normal  01-03-21 chest pain, inferior STEMI, to cath lab   Ost LAD to Prox LAD lesion is 50% stenosed.  Mid LAD lesion is 20% stenosed.  Prox RCA-1 lesion is 100% stenosed.  Prox RCA-2 lesion is 50% stenosed.  A stent was successfully placed.  Post intervention, there is a 0% residual stenosis.  Post intervention, there is a 0% residual stenosis.   Acute inferior wall myocardial infarction secondary to total proximal RCA occlusion complicated by cardiogenic shock, bradycardia, episodes of ventricular fibrillation requiring defibrillation, CPR, and emergency intubation.   Emergent temporary pacemaker insertion advanced to the RV apex.   Initial left coronary injection revealed flow minimal filling beyond the mid LAD and mid circumflex vessel.   Cardiogenic shock requiring emergent Impella CP insertion via the left femoral artery.   Relook of the left coronary system after Impella insertion showed brisk TIMI-3 flow with evidence for calcification of the proximal LAD with 50% stenosis and mild 30% mid LAD stenosis, a very large high marginal/ramus distribution vessel extending to the apex and otherwise a normal-appearing left circumflex coronary artery.   Total occlusion of the very proximal RCA with TIMI 0 flow   Successful emergent PCI to the RCA with ultimate insertion of a 3.5 x 34 mm Resolute Onyx stent postdilated to 3.8 mm with 800%  occlusion being reduced to 0% and with restoration of brisk TIMI-3 flow supplying a very large dominant RCA.      07-07-22  1. Left ventricular ejection fraction, by estimation, is 55 to 60%. The  left ventricle has normal function. The left ventricle has no regional  wall motion abnormalities. Left ventricular diastolic parameters were  normal.   2. Right ventricular systolic function is normal. The right ventricular  size is mildly enlarged. There is normal pulmonary artery systolic  pressure.   3. Right atrial size was mildly dilated.   4. The mitral valve is normal in structure. No evidence of mitral valve  regurgitation. No evidence of mitral stenosis.   5. The aortic valve is tricuspid. Aortic valve regurgitation is not  visualized. Aortic valve sclerosis is present, with no evidence of aortic  valve stenosis.   6. The inferior vena cava is normal in size with greater than 50%  respiratory variability, suggesting right atrial pressure of 3 mmHg.     Neuro/Psych  PSYCHIATRIC DISORDERS Anxiety Depression     Hx anoxic brain injury per office notes negative neurological ROS   Neuromuscular disease    GI/Hepatic Neg liver ROS,GERD  ,,  Endo/Other  diabetes    Renal/GU Renal disease  negative genitourinary   Musculoskeletal  (+) Arthritis ,    Abdominal   Peds negative pediatric ROS (+)  Hematology negative hematology ROS (+)   Anesthesia Other Findings Anxiety  GERD (gastroesophageal reflux disease) Hyperlipidemia Hypertension ED (erectile dysfunction) Kidney stone Vitreous floater  Arthritis Cancer  Cataract Depression  Neuromuscular disorder  Anoxic brain injury Heart failure  Psoriasis  Myocardial infarction  Coronary artery disease  CHF (congestive heart failure)  Chronic systolic heart failure  01-03-21 inferior STEMI Hx cardiac stent DES 01-03-21  Last cataract surgery 07-23-23   Reproductive/Obstetrics negative OB ROS                               Anesthesia Physical Anesthesia Plan  ASA: 3  Anesthesia Plan: MAC   Post-op Pain Management:    Induction: Intravenous  PONV Risk Score and Plan:   Airway Management Planned: Natural Airway and Nasal Cannula  Additional Equipment:   Intra-op Plan:   Post-operative Plan:   Informed Consent: I have reviewed the patients History and Physical, chart, labs and discussed the procedure including the risks, benefits and alternatives for the proposed anesthesia with the patient or authorized representative who has indicated his/her understanding and acceptance.     Dental Advisory Given  Plan Discussed with: Anesthesiologist, CRNA and Surgeon  Anesthesia Plan Comments: (Patient consented for risks of anesthesia including but not limited to:  - adverse reactions to medications - damage to eyes, teeth, lips or other oral mucosa - nerve damage due to positioning  - sore throat or hoarseness - Damage to heart, brain, nerves, lungs, other parts of body or loss of life  Patient voiced understanding and assent.)         Anesthesia Quick Evaluation

## 2023-08-03 ENCOUNTER — Ambulatory Visit: Payer: Medicare PPO | Admitting: General Practice

## 2023-08-03 ENCOUNTER — Encounter
Admission: RE | Disposition: A | Discharge status: Home / Self Care | Payer: Self-pay | Source: Home / Self Care | Attending: Ophthalmology

## 2023-08-03 ENCOUNTER — Other Ambulatory Visit: Payer: Self-pay

## 2023-08-03 ENCOUNTER — Ambulatory Visit
Admission: RE | Admit: 2023-08-03 | Discharge: 2023-08-03 | Disposition: A | Discharge status: Home / Self Care | Payer: Medicare PPO | Attending: Ophthalmology | Admitting: Ophthalmology

## 2023-08-03 ENCOUNTER — Encounter: Payer: Self-pay | Admitting: Ophthalmology

## 2023-08-03 DIAGNOSIS — H2511 Age-related nuclear cataract, right eye: Secondary | ICD-10-CM | POA: Diagnosis not present

## 2023-08-03 DIAGNOSIS — E1122 Type 2 diabetes mellitus with diabetic chronic kidney disease: Secondary | ICD-10-CM | POA: Insufficient documentation

## 2023-08-03 DIAGNOSIS — F32A Depression, unspecified: Secondary | ICD-10-CM | POA: Insufficient documentation

## 2023-08-03 DIAGNOSIS — F419 Anxiety disorder, unspecified: Secondary | ICD-10-CM | POA: Diagnosis not present

## 2023-08-03 DIAGNOSIS — I5022 Chronic systolic (congestive) heart failure: Secondary | ICD-10-CM | POA: Insufficient documentation

## 2023-08-03 DIAGNOSIS — E1136 Type 2 diabetes mellitus with diabetic cataract: Secondary | ICD-10-CM | POA: Insufficient documentation

## 2023-08-03 DIAGNOSIS — I25119 Atherosclerotic heart disease of native coronary artery with unspecified angina pectoris: Secondary | ICD-10-CM | POA: Insufficient documentation

## 2023-08-03 DIAGNOSIS — N1832 Chronic kidney disease, stage 3b: Secondary | ICD-10-CM | POA: Diagnosis not present

## 2023-08-03 DIAGNOSIS — I130 Hypertensive heart and chronic kidney disease with heart failure and stage 1 through stage 4 chronic kidney disease, or unspecified chronic kidney disease: Secondary | ICD-10-CM | POA: Diagnosis not present

## 2023-08-03 DIAGNOSIS — I252 Old myocardial infarction: Secondary | ICD-10-CM | POA: Insufficient documentation

## 2023-08-03 DIAGNOSIS — K219 Gastro-esophageal reflux disease without esophagitis: Secondary | ICD-10-CM | POA: Diagnosis not present

## 2023-08-03 HISTORY — DX: Unspecified hearing loss, bilateral: H91.93

## 2023-08-03 HISTORY — DX: Old myocardial infarction: I25.2

## 2023-08-03 HISTORY — PX: CATARACT EXTRACTION W/PHACO: SHX586

## 2023-08-03 HISTORY — DX: Unspecified mood (affective) disorder: F39

## 2023-08-03 HISTORY — DX: Presence of coronary angioplasty implant and graft: Z95.5

## 2023-08-03 HISTORY — DX: Type 2 diabetes mellitus with diabetic chronic kidney disease: E11.22

## 2023-08-03 HISTORY — DX: Chronic kidney disease, stage 3b: N18.32

## 2023-08-03 LAB — GLUCOSE, CAPILLARY: Glucose-Capillary: 84 mg/dL (ref 70–99)

## 2023-08-03 SURGERY — PHACOEMULSIFICATION, CATARACT, WITH IOL INSERTION
Anesthesia: Monitor Anesthesia Care | Laterality: Right

## 2023-08-03 MED ORDER — ARMC OPHTHALMIC DILATING DROPS
OPHTHALMIC | Status: AC
Start: 2023-08-03 — End: ?
  Filled 2023-08-03: qty 0.5

## 2023-08-03 MED ORDER — MOXIFLOXACIN HCL 0.5 % OP SOLN
OPHTHALMIC | Status: DC | PRN
  Administered 2023-08-03: .2 mL via OPHTHALMIC

## 2023-08-03 MED ORDER — MIDAZOLAM HCL 2 MG/2ML IJ SOLN
INTRAMUSCULAR | Status: AC
  Filled 2023-08-03: qty 2

## 2023-08-03 MED ORDER — TETRACAINE HCL 0.5 % OP SOLN
1.0000 [drp] | OPHTHALMIC | Status: DC | PRN
  Administered 2023-08-03 (×3): 1 [drp] via OPHTHALMIC

## 2023-08-03 MED ORDER — SODIUM CHLORIDE 0.9% FLUSH
INTRAVENOUS | Status: DC | PRN
  Administered 2023-08-03 (×3): 10 mL via INTRAVENOUS

## 2023-08-03 MED ORDER — GLYCOPYRROLATE 0.2 MG/ML IJ SOLN
INTRAMUSCULAR | Status: DC | PRN
  Administered 2023-08-03: .2 mg via INTRAVENOUS

## 2023-08-03 MED ORDER — TETRACAINE HCL 0.5 % OP SOLN
OPHTHALMIC | Status: AC
  Filled 2023-08-03: qty 4

## 2023-08-03 MED ORDER — SIGHTPATH DOSE#1 NA HYALUR & NA CHOND-NA HYALUR IO KIT
PACK | INTRAOCULAR | Status: DC | PRN
  Administered 2023-08-03: 1 via OPHTHALMIC

## 2023-08-03 MED ORDER — LIDOCAINE HCL (PF) 2 % IJ SOLN
INTRAOCULAR | Status: DC | PRN
  Administered 2023-08-03: 1 mL via INTRAOCULAR

## 2023-08-03 MED ORDER — FENTANYL CITRATE (PF) 100 MCG/2ML IJ SOLN
INTRAMUSCULAR | Status: DC | PRN
  Administered 2023-08-03: 25 ug via INTRAVENOUS
  Administered 2023-08-03: 50 ug via INTRAVENOUS

## 2023-08-03 MED ORDER — SIGHTPATH DOSE#1 BSS IO SOLN
INTRAOCULAR | Status: DC | PRN
  Administered 2023-08-03: 96 mL via OPHTHALMIC

## 2023-08-03 MED ORDER — MIDAZOLAM HCL 2 MG/2ML IJ SOLN
INTRAMUSCULAR | Status: DC | PRN
  Administered 2023-08-03: 2 mg via INTRAVENOUS

## 2023-08-03 MED ORDER — FENTANYL CITRATE (PF) 100 MCG/2ML IJ SOLN
INTRAMUSCULAR | Status: AC
  Filled 2023-08-03: qty 2

## 2023-08-03 MED ORDER — ARMC OPHTHALMIC DILATING DROPS
1.0000 | OPHTHALMIC | Status: DC | PRN
  Administered 2023-08-03 (×3): 1 via OPHTHALMIC

## 2023-08-03 MED ORDER — SIGHTPATH DOSE#1 BSS IO SOLN
INTRAOCULAR | Status: DC | PRN
  Administered 2023-08-03: 15 mL via INTRAOCULAR

## 2023-08-03 SURGICAL SUPPLY — 11 items
CATARACT SUITE SIGHTPATH (MISCELLANEOUS) ×1
DISSECTOR HYDRO NUCLEUS 50X22 (MISCELLANEOUS) ×1 IMPLANT
FEE CATARACT SUITE SIGHTPATH (MISCELLANEOUS) ×1 IMPLANT
GLOVE SURG GAMMEX PI TX LF 7.5 (GLOVE) ×1 IMPLANT
GLOVE SURG SYN 8.5 E (GLOVE) ×1
GLOVE SURG SYN 8.5 PF PI (GLOVE) ×1 IMPLANT
LENS IOL TECNIS EYHANCE 23.0 (Intraocular Lens) IMPLANT
NDL FILTER BLUNT 18X1 1/2 (NEEDLE) ×1 IMPLANT
NEEDLE FILTER BLUNT 18X1 1/2 (NEEDLE) ×1
SYR 3ML LL SCALE MARK (SYRINGE) ×1 IMPLANT
SYR 5ML LL (SYRINGE) ×1 IMPLANT

## 2023-08-03 NOTE — Transfer of Care (Signed)
Immediate Anesthesia Transfer of Care Note  Patient: [name]  Procedure(s) Performed: CATARACT EXTRACTION PHACO AND INTRAOCULAR LENS PLACEMENT (IOC) RIGHT DIABETIC 2.37 00:22.9 (Right)  Patient Location: PACU  Anesthesia Type:MAC  Level of Consciousness: awake, alert , and oriented  Airway & Oxygen Therapy: Patient Spontanous Breathing  Post-op Assessment: Report given to RN and Post -op Vital signs reviewed and stable  Post vital signs: Reviewed and stable  Last Vitals: See PACU vlow sheet for all nl V/S  Vitals Value Taken Time  BP    Temp    Pulse    Resp    SpO2      Last Pain:  Vitals:   08/03/23 0634  PainSc: 0-No pain         Complications: No notable events documented.

## 2023-08-03 NOTE — Anesthesia Postprocedure Evaluation (Signed)
Anesthesia Post Note  Patient: Isileli Gatson  Procedure(s) Performed: CATARACT EXTRACTION PHACO AND INTRAOCULAR LENS PLACEMENT (IOC) RIGHT DIABETIC 2.37 00:22.9 (Right)  Patient location during evaluation: PACU Anesthesia Type: MAC Level of consciousness: awake and alert Pain management: pain level controlled Vital Signs Assessment: post-procedure vital signs reviewed and stable Respiratory status: spontaneous breathing, nonlabored ventilation, respiratory function stable and patient connected to nasal cannula oxygen Cardiovascular status: stable and blood pressure returned to baseline Postop Assessment: no apparent nausea or vomiting Anesthetic complications: no   No notable events documented.   Last Vitals:  Vitals:   08/03/23 0755 08/03/23 0801  BP: 135/81 136/89  Pulse: 70 75  Resp: (!) 26 18  Temp: (!) 36.3 C (!) 36.3 C  SpO2: 98% 94%    Last Pain:  Vitals:   08/03/23 0801  PainSc: 0-No pain                 Sansa Alkema C Permelia Bamba

## 2023-08-03 NOTE — Op Note (Signed)
OPERATIVE NOTE  [name] [id number] 08/03/2023   PREOPERATIVE DIAGNOSIS:  Nuclear sclerotic cataract right eye.  H25.11   POSTOPERATIVE DIAGNOSIS:    Nuclear sclerotic cataract right eye.     PROCEDURE:  Phacoemusification with posterior chamber intraocular lens placement of the right eye   LENS:   Implant Name Type Inv. Item Serial No. Manufacturer Lot No. LRB No. Used Action  LENS IOL TECNIS EYHANCE 23.0 - [id number] Intraocular Lens LENS IOL TECNIS EYHANCE 23.0 [id number] [employer]  Right 1 Implanted       Procedure(s): CATARACT EXTRACTION PHACO AND INTRAOCULAR LENS PLACEMENT (IOC) RIGHT DIABETIC 2.37 00:22.9 (Right)  SURGEON:  Willey Blade, MD, MPH  ANESTHESIOLOGIST: Anesthesiologist: Marisue Humble, MD CRNA: Genia Del, CRNA   ANESTHESIA:  Topical with tetracaine drops augmented with 1% preservative-free intracameral lidocaine.  ESTIMATED BLOOD LOSS: less than 1 mL.   COMPLICATIONS:  None.   DESCRIPTION OF PROCEDURE:  The patient was identified in the holding room and transported to the operating room and placed in the supine position under the operating microscope.  The right eye was identified as the operative eye and it was prepped and draped in the usual sterile ophthalmic fashion.   A 1.0 millimeter clear-corneal paracentesis was made at the 10:30 position. 0.5 ml of preservative-free 1% lidocaine with epinephrine was injected into the anterior chamber.  The anterior chamber was filled with viscoelastic.  A 2.4 millimeter keratome was used to make a near-clear corneal incision at the 8:00 position.  A curvilinear capsulorrhexis was made with a cystotome and capsulorrhexis forceps.  Balanced salt solution was used to hydrodissect and hydrodelineate the nucleus.   Phacoemulsification was then used in stop and chop fashion to remove the lens nucleus and epinucleus.  The remaining cortex was then removed using the irrigation and aspiration handpiece.  Viscoelastic was then placed into the capsular bag to distend it for lens placement.  A lens was then injected into the capsular bag.  The remaining viscoelastic was aspirated.   Wounds were hydrated with balanced salt solution.  The anterior chamber was inflated to a physiologic pressure with balanced salt solution.   Intracameral vigamox 0.1 mL undiluted was injected into the eye and a drop placed onto the ocular surface.  No wound leaks were noted.  The patient was taken to the recovery room in stable condition without complications of anesthesia or surgery  Willey Blade 08/03/2023, 7:54 AM

## 2023-08-03 NOTE — H&P (Signed)
[facility]   Primary Care Physician:  Corwin Levins, MD Ophthalmologist: Dr. Willey Blade  Pre-Procedure History & Physical: HPI:  [name] is a 72 y.o. male here for cataract surgery.   Past Medical History:  Diagnosis Date   Anoxic brain injury (HCC)    Anxiety    Anxiety    Arthritis    Bilateral hearing loss    Cancer (HCC) 2010   basal cell ca, head   Cataract    bilateral   CHF (congestive heart failure) (HCC)    Chronic systolic heart failure (HCC)    Coronary artery disease    Depression    ED (erectile dysfunction)    GERD (gastroesophageal reflux disease)    Heart failure (HCC)    History of heart artery stent    History of ST elevation myocardial infarction (STEMI)    Hyperlipidemia    Hypertension    Kidney stone 07/29/2011   passed    Mood disorder (HCC)    Myocardial infarction (HCC)    Neuromuscular disorder (HCC)    neuropathy legs   Psoriasis    Stage 3b chronic kidney disease (CKD) (HCC)    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 Oren Bracket at [facility]     Past Surgical History:  Procedure Laterality Date   BASAL CELL CARCINOMA EXCISION     removed from scalp   CATARACT EXTRACTION W/PHACO Left 07/13/2023   Procedure: CATARACT EXTRACTION PHACO AND INTRAOCULAR LENS PLACEMENT (IOC) LEFT DIABETIC 3.20 00:29.8;  Surgeon: Nevada Crane, MD;  Location: [facility];  Service: Ophthalmology;  Laterality: Left;   COLONOSCOPY  07/03/2017   per Dr. Adela Lank, sessile serrated polyp, repeat in 5 yrs    HERNIA REPAIR     LEFT HEART CATH AND CORONARY ANGIOGRAPHY N/A 01/03/2021   Procedure: LEFT HEART CATH AND CORONARY ANGIOGRAPHY;  Surgeon: Lennette Bihari, MD;  Location: [facility];  Service: Cardiovascular;  Laterality: N/A;   RIGHT HEART CATH N/A 01/03/2021   Procedure: RIGHT HEART CATH;  Surgeon: Dolores Patty, MD;  Location: [facility]VAS[facility]  CV LAB;  Service: Cardiovascular;  Laterality: N/A;    Prior to Admission medications   Medication Sig Start Date End Date Taking? Authorizing Provider  aspirin 81 MG chewable tablet Chew 1 tablet (81 mg total) by mouth daily. 03/21/21  Yes Nelwyn Salisbury, MD  carvedilol (COREG) 3.125 MG tablet TAKE ONE TABLET BY MOUTH TWICE DAILY 05/04/23  Yes O'Neal, Ronnald Ramp, MD  Cholecalciferol (VITAMIN D) 50 MCG (2000 UT) CAPS Take 1 capsule by mouth daily.   Yes [provider]  DULoxetine HCl 40 MG CPEP Take 1 capsule (40 mg total) by mouth daily. 07/07/23  Yes Karie Schwalbe, MD  ENTRESTO 24-26 MG TAKE ONE TABLET by MOUTH twice daily 05/04/23  Yes O'Neal, Ronnald Ramp, MD  FARXIGA 10 MG TABS tablet TAKE ONE TABLET BY MOUTH BEFORE BREAKFAST 02/02/23  Yes O'Neal, Ronnald Ramp, MD  finasteride ([name]) 5 MG tablet Take 1 tablet (5 mg total) by mouth daily. 06/11/22  Yes Nelwyn Salisbury, MD  LORazepam (ATIVAN) 2 MG tablet Take 0.5-1 tablets (1-2 mg total) by mouth 3 (three) times daily as needed. for anxiety 07/07/23  Yes Tillman Abide I, MD  melatonin 3 MG TABS tablet Take 1 tablet by mouth daily.   Yes [provider]  omeprazole (  PRILOSEC) 20 MG capsule Take 1 capsule (20 mg total) by mouth daily as needed. Patient taking differently: Take 20 mg by mouth daily. 06/18/23  Yes Karie Schwalbe, MD  rosuvastatin (CRESTOR) 10 MG tablet TAKE ONE TABLET BY MOUTH ONCE DAILY 05/04/23  Yes O'Neal, Ronnald Ramp, MD  sildenafil (REVATIO) 20 MG tablet Take 20 mg by mouth as needed.   Yes [provider]  tiZANidine (ZANAFLEX) 4 MG tablet TAKE 1 TABLET BY MOUTH AT BEDTIME 07/06/23  Yes Copland, Karleen Hampshire, MD  diclofenac Sodium (VOLTAREN) 1 % GEL Apply 2 g topically 2 (two) times daily as needed (pain).    [provider]  ketoconazole (NIZORAL) 2 % cream Apply 1 application topically daily as needed for irritation.    [provider]  nitroGLYCERIN (NITROSTAT) 0.4 MG  SL tablet Place 1 tablet (0.4 mg total) under the tongue every 5 (five) minutes as needed for chest pain. 02/14/21   Nelwyn Salisbury, MD  triamcinolone cream (KENALOG) 0.1 % Apply 1 application topically 2 (two) times daily as needed (psoriasis).    [provider]  tadalafil (CIALIS) 20 MG tablet Take 20 mg by mouth daily as needed.    12/17/11  [provider]    Allergies as of 07/01/2023 - Review Complete 07/01/2023  Allergen Reaction Noted   Codeine Other (See Comments) 12/23/2010   Lisinopril Cough 08/15/2019   Nsaids  07/22/2022    Family History  Problem Relation Age of Onset   Hypertension Mother    Hyperlipidemia Mother    Depression Other    Diabetes Other    Hyperlipidemia Other    Hypertension Other    Alzheimer's disease Other    Colon cancer Neg Hx    Stomach cancer Neg Hx    Rectal cancer Neg Hx    Esophageal cancer Neg Hx     Social History   Socioeconomic History   Marital status: Single    Spouse name: Not on file   Number of children: 0   Years of education: Not on file   Highest education level: Master's degree (e.g., MA, MS, MEng, MEd, MSW, MBA)  Occupational History   Occupation: retired    Comment: Runner, broadcasting/film/video   Occupation: Careers adviser - [employer]  Tobacco Use   Smoking status: Never    Passive exposure: Past   Smokeless tobacco: Never  Vaping Use   Vaping status: Never Used  Substance and Sexual Activity   Alcohol use: No    Alcohol/week: 0.0 standard drinks of alcohol   Drug use: No   Sexual activity: Not on file  Other Topics Concern   Not on file  Social History Narrative   Lives alone--[place]      Has living will   Health care POA-- friend Trey Paula   Would accept resuscitation--but no prolonged machines or tube feedings   Social Determinants of Health   Financial Resource Strain: Low Risk  (02/15/2023)   Overall Financial Resource Strain (CARDIA)    Difficulty of Paying Living Expenses: Not hard at all  Food  Insecurity: No Food Insecurity (02/15/2023)   Hunger Vital Sign    Worried About Running Out of Food in the Last Year: Never true    Ran Out of Food in the Last Year: Never true  Transportation Needs: No Transportation Needs (02/15/2023)   PRAPARE - Administrator, Civil Service (Medical): No    Lack of Transportation (Non-Medical): No  Physical Activity: Sufficiently Active (  02/15/2023)   Exercise Vital Sign    Days of Exercise per Week: 7 days    Minutes of Exercise per Session: 30 min  Stress: Stress Concern Present (02/15/2023)   [employer] of Occupational Health - Occupational Stress Questionnaire    Feeling of Stress : To some extent  Social Connections: Unknown (02/15/2023)   Social Connection and Isolation Panel [NHANES]    Frequency of Communication with Friends and Family: More than three times a week    Frequency of Social Gatherings with Friends and Family: Twice a week    Attends Religious Services: Patient declined    Database administrator or Organizations: Yes    Attends Engineer, structural: More than 4 times per year    Marital Status: Never married  Recent Concern: Social Connections - Moderately Isolated (12/15/2022)   Social Connection and Isolation Panel [NHANES]    Frequency of Communication with Friends and Family: More than three times a week    Frequency of Social Gatherings with Friends and Family: More than three times a week    Attends Religious Services: Never    Database administrator or Organizations: Yes    Attends Engineer, structural: More than 4 times per year    Marital Status: Never married  Intimate Partner Violence: Not At Risk (12/15/2022)   Humiliation, Afraid, Rape, and Kick questionnaire    Fear of Current or Ex-Partner: No    Emotionally Abused: No    Physically Abused: No    Sexually Abused: No    Review of Systems: See HPI, otherwise negative ROS  Physical Exam: BP (!) 149/74   Pulse (!) 53   Temp  (!) 97.3 F (36.3 C)   Wt 83 kg   SpO2 100%   BMI 24.81 kg/m  General:   Alert, cooperative in NAD Head:  Normocephalic and atraumatic. Respiratory:  Normal work of breathing. Cardiovascular:  RRR  Impression/Plan: Zecharia Himmelberg is here for cataract surgery.  Risks, benefits, limitations, and alternatives regarding cataract surgery have been reviewed with the patient.  Questions have been answered.  All parties agreeable.   Willey Blade, MD  08/03/2023, 7:17 AM

## 2023-08-05 ENCOUNTER — Encounter: Payer: Self-pay | Admitting: Ophthalmology

## 2023-08-11 ENCOUNTER — Encounter: Payer: Self-pay | Admitting: Internal Medicine

## 2023-08-11 MED ORDER — LORAZEPAM 2 MG PO TABS: 1.0000 mg | ORAL_TABLET | Freq: Three times a day (TID) | ORAL | 2 refills | Status: DC | PRN

## 2023-08-13 ENCOUNTER — Ambulatory Visit: Payer: Medicare PPO | Attending: Internal Medicine | Admitting: Audiology

## 2023-08-13 DIAGNOSIS — H9313 Tinnitus, bilateral: Secondary | ICD-10-CM | POA: Insufficient documentation

## 2023-08-13 DIAGNOSIS — H903 Sensorineural hearing loss, bilateral: Secondary | ICD-10-CM | POA: Diagnosis not present

## 2023-08-13 DIAGNOSIS — N1832 Chronic kidney disease, stage 3b: Secondary | ICD-10-CM | POA: Diagnosis not present

## 2023-08-13 NOTE — Procedures (Signed)
Outpatient Audiology and [facility] [address] [place], [place]  [address] [phone]  AUDIOLOGICAL  EVALUATION  NAME: [name]     DOB:   Mar 23, 1951      MRN: [id number]                                                                                     DATE: 08/13/2023     REFERENT: Corwin Levins, MD STATUS: Outpatient DIAGNOSIS: SNHL, Tinnitus   History: [name] was seen for an audiological evaluation due to decreased hearing. [name] reports he is turning up the television louder. He also reports difficulty hearing friends in the presence of background noise. [name] reports bilateral constant, bothersome, tinnitus. He reports the tinnitus can interfere with his daily activities. [name] reports bilateral aural fullness. He denies otalgia. [name] has a history of vertigo and dizziness. He reports his most recent episode of vertigo was 5+ years ago. [name] reports a left "popping" sensation in his ear.   Evaluation:  Otoscopy showed a clear view of the tympanic membranes, bilaterally Tympanometry results were consistent with normal middle ear pressure and normal tympanic membrane mobility (Type A), bilaterally.  Audiometric testing was completed using Conventional Audiometry techniques with insert earphones and TDH headphones. Test results are consistent in the right ear with normal hearing sensitivity sloping to a moderate sensorineural hearing loss and results are consistent in the left ear with normal hearing sensitivity sloping to a moderately-severe sensorineural hearing loss. There is an asymmetry noted at 500 Hz and 3000-6000 Hz, worse in the left ear. Speech Recognition Thresholds were obtained at 30 dB HL in the right ear and at 30  dB HL in the left ear. Word Recognition Testing was completed at 70 dB HL and [name] scored 100% in the right ear and 96% in the left ear.      Results:  The test results were reviewed with [name]. Test results are  consistent in the right ear with normal hearing sensitivity sloping to a moderate sensorineural hearing loss and results are consistent in the left ear with normal hearing sensitivity sloping to a moderately-severe sensorineural hearing loss. There is an asymmetry noted at 500 Hz and 3000-6000 Hz, worse in the left ear. [name] will have hearing and communication difficulty in many listening environments. He will benefit from the use of good communication strategies and the use of hearing aids if motivated. [name] was given a list of hearing aid providers in the [place] area. Tinnitus management strategies were reviewed. [name] was given handouts on tinnitus and tinnitus management. A referral to an Ear, Nose, and Throat Physician was reviewed due to tinnitus and asymmetric hearing loss.    Recommendations: 1.   Referral to Dr. Allena Katz at  [facility] for further evaluation of asymmetric hearing loss and tinnitus.  2.   Communication Needs Assessment with an Audiologist if motivated to pursue amplification. 3.   Use of tinnitus management strategies 4.   Monitor hearing sensitivity. Return in 2 years for an audiological evaluation.    45 minutes spent testing and counseling on results.   If you have any questions please feel free to contact  me at [phone].  Marton Redwood Audiologist, Au.D., CCC-A 08/13/2023  2:25 PM  Cc: Corwin Levins, MD

## 2023-08-24 DIAGNOSIS — N1831 Chronic kidney disease, stage 3a: Secondary | ICD-10-CM | POA: Diagnosis not present

## 2023-08-24 DIAGNOSIS — I129 Hypertensive chronic kidney disease with stage 1 through stage 4 chronic kidney disease, or unspecified chronic kidney disease: Secondary | ICD-10-CM | POA: Diagnosis not present

## 2023-08-24 DIAGNOSIS — D631 Anemia in chronic kidney disease: Secondary | ICD-10-CM | POA: Diagnosis not present

## 2023-08-24 DIAGNOSIS — N2581 Secondary hyperparathyroidism of renal origin: Secondary | ICD-10-CM | POA: Diagnosis not present

## 2023-08-24 DIAGNOSIS — E1122 Type 2 diabetes mellitus with diabetic chronic kidney disease: Secondary | ICD-10-CM | POA: Diagnosis not present

## 2023-08-27 DIAGNOSIS — Z0100 Encounter for examination of eyes and vision without abnormal findings: Secondary | ICD-10-CM | POA: Diagnosis not present

## 2023-08-28 ENCOUNTER — Encounter: Payer: Self-pay | Admitting: Family Medicine

## 2023-08-28 ENCOUNTER — Ambulatory Visit: Payer: Medicare PPO | Admitting: Family Medicine

## 2023-08-28 VITALS — BP 140/80 | HR 64 | Temp 98.6°F | Ht 72.0 in | Wt 183.4 lb

## 2023-08-28 DIAGNOSIS — R051 Acute cough: Secondary | ICD-10-CM | POA: Diagnosis not present

## 2023-08-28 DIAGNOSIS — J069 Acute upper respiratory infection, unspecified: Secondary | ICD-10-CM | POA: Diagnosis not present

## 2023-08-28 MED ORDER — PROMETHAZINE HCL 6.25 MG/5ML PO SOLN: 6.2500 mg | Freq: Four times a day (QID) | ORAL | 0 refills | Status: DC | PRN

## 2023-08-28 MED ORDER — AZITHROMYCIN 250 MG PO TABS: ORAL_TABLET | ORAL | 0 refills | Status: AC

## 2023-08-28 NOTE — Patient Instructions (Signed)
I have sent in azithromycin for you to take. Take 2 tablets today, then one tablet once daily for the next 4 days.  I have sent in cough syrup for you to take 5 mL once daily in the evening as needed for cough. This medication may make you sleepy.  Do not drive or operate heavy machinery while taking this medication.   Follow-up with me for new or worsening symptoms.

## 2023-08-28 NOTE — Progress Notes (Unsigned)
Acute Office Visit  Subjective:     Patient ID: [name], male    DOB: 05-May-1951, 72 y.o.   MRN: [id number]  Chief Complaint  Patient presents with  . URI    Patient started last Friday. Coughing is worse in the evening and nights. Cough feels like its "deep"  Yellow mucus currently. Took a covid test on Monday it was negative. This week has been was really bad with the coughing. Has been taking alka seltzer plus and delsym at night. Constant fatigue.     HPI 72 year old male presents for evaluation of acute cough for the last Has tried Has not taken a COVID test for this illness. Denies known sick contacts. Denies abdominal pain, nausea, vomiting of diarrhea, rash, fever, chills, other symptoms. Medical history as outlined below.  ROS Per HPI      Objective:    BP (!) 140/80 (BP Location: Left Arm, Patient Position: Sitting, Cuff Size: Normal)   Pulse 64   Temp 98.6 F (37 C) (Oral)   Ht 6' (1.829 m)   Wt 183 lb 6.4 oz (83.2 kg)   SpO2 96%   BMI 24.87 kg/m    Physical Exam Vitals and nursing note reviewed.  Constitutional:      Appearance: He is ill-appearing.  HENT:     Head: Normocephalic and atraumatic.     Right Ear: Ear canal normal.     Left Ear: Tympanic membrane and ear canal normal.     Ears:     Comments: R middle ear effusion    Nose: Congestion present.     Mouth/Throat:     Mouth: Mucous membranes are moist.     Pharynx: Oropharynx is clear. No oropharyngeal exudate or posterior oropharyngeal erythema.  Eyes:     Extraocular Movements: Extraocular movements intact.  Cardiovascular:     Rate and Rhythm: Normal rate and regular rhythm.     Heart sounds: Normal heart sounds.  Pulmonary:     Effort: Pulmonary effort is normal. No respiratory distress.     Breath sounds: Rhonchi present. No wheezing or rales.     Comments: Productive cough  Musculoskeletal:     Cervical back: Normal range of motion.  Lymphadenopathy:     Cervical:  Cervical adenopathy present.  Neurological:     Mental Status: He is alert.   No results found for any visits on 08/28/23.      Assessment & Plan:  1. Upper respiratory tract infection, unspecified type   - azithromycin (ZITHROMAX) 250 MG tablet; Take 2 tablets on day 1, then 1 tablet daily on days 2 through 5  Dispense: 6 tablet; Refill: 0  2. Acute cough  - promethazine (PHENERGAN) 6.25 MG/5ML solution; Take 5 mLs (6.25 mg total) by mouth every 6 (six) hours as needed for up to 6 days. For cough  Dispense: 120 mL; Refill: 0   Meds ordered this encounter  Medications  . azithromycin (ZITHROMAX) 250 MG tablet    Sig: Take 2 tablets on day 1, then 1 tablet daily on days 2 through 5    Dispense:  6 tablet    Refill:  0  . promethazine (PHENERGAN) 6.25 MG/5ML solution    Sig: Take 5 mLs (6.25 mg total) by mouth every 6 (six) hours as needed for up to 6 days. For cough    Dispense:  120 mL    Refill:  0    Return if symptoms worsen or fail to improve.  Moshe Cipro, FNP

## 2023-09-24 ENCOUNTER — Telehealth: Payer: Self-pay

## 2023-09-24 DIAGNOSIS — H9193 Unspecified hearing loss, bilateral: Secondary | ICD-10-CM

## 2023-09-24 NOTE — Telephone Encounter (Signed)
 Copied from CRM [id number]. Topic: Referral - Status >> Sep 22, 2023  2:10 PM Leila C wrote: Reason for CRM: Patient saw Darryle Posey, Audiologist and recommending patient to ENT specialist Dr. Tobie. Since Darryle Posey is not a physician, their office cannot do referrals, patient was advised to contact pcp to set up the referral for ENT. Please call back at [phone] or via MyChart.

## 2023-09-24 NOTE — Telephone Encounter (Signed)
Ok this is done thanks

## 2023-09-30 ENCOUNTER — Encounter (INDEPENDENT_AMBULATORY_CARE_PROVIDER_SITE_OTHER): Payer: Self-pay | Admitting: Otolaryngology

## 2023-09-30 ENCOUNTER — Ambulatory Visit: Payer: Medicare PPO | Admitting: Clinical

## 2023-09-30 DIAGNOSIS — F411 Generalized anxiety disorder: Secondary | ICD-10-CM

## 2023-09-30 DIAGNOSIS — F439 Reaction to severe stress, unspecified: Secondary | ICD-10-CM
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
## 2023-09-30 NOTE — Progress Notes (Signed)
   Dalton Barthel, LCSW

## 2023-10-14 ENCOUNTER — Ambulatory Visit: Payer: Medicare PPO | Admitting: Clinical

## 2023-10-15 ENCOUNTER — Other Ambulatory Visit: Payer: Self-pay | Admitting: Urology

## 2023-10-15 DIAGNOSIS — D49511 Neoplasm of unspecified behavior of right kidney: Secondary | ICD-10-CM

## 2023-10-15 DIAGNOSIS — N281 Cyst of kidney, acquired: Secondary | ICD-10-CM

## 2023-10-21 ENCOUNTER — Other Ambulatory Visit: Payer: Medicare PPO

## 2023-10-28 ENCOUNTER — Ambulatory Visit: Payer: Medicare PPO | Admitting: Clinical

## 2023-10-30 ENCOUNTER — Ambulatory Visit (HOSPITAL_COMMUNITY)
Admission: RE | Admit: 2023-10-30 | Discharge: 2023-10-30 | Disposition: A | Discharge status: Home / Self Care | Payer: Medicare PPO | Source: Ambulatory Visit | Attending: Urology | Admitting: Urology

## 2023-10-30 DIAGNOSIS — K429 Umbilical hernia without obstruction or gangrene: Secondary | ICD-10-CM | POA: Diagnosis not present

## 2023-10-30 DIAGNOSIS — K8020 Calculus of gallbladder without cholecystitis without obstruction: Secondary | ICD-10-CM | POA: Diagnosis not present

## 2023-10-30 DIAGNOSIS — N281 Cyst of kidney, acquired: Secondary | ICD-10-CM | POA: Diagnosis not present

## 2023-10-30 DIAGNOSIS — R161 Splenomegaly, not elsewhere classified: Secondary | ICD-10-CM | POA: Diagnosis not present

## 2023-10-30 DIAGNOSIS — D49511 Neoplasm of unspecified behavior of right kidney: Secondary | ICD-10-CM | POA: Diagnosis not present

## 2023-10-30 MED ORDER — GADOBUTROL 1 MMOL/ML IV SOLN: 8.0000 mL | Freq: Once | INTRAVENOUS | Status: DC | PRN

## 2023-10-31 ENCOUNTER — Other Ambulatory Visit: Payer: Medicare PPO

## 2023-11-18 ENCOUNTER — Ambulatory Visit: Payer: Medicare PPO | Admitting: Clinical

## 2023-11-18 DIAGNOSIS — F439 Reaction to severe stress, unspecified: Secondary | ICD-10-CM

## 2023-11-18 DIAGNOSIS — F411 Generalized anxiety disorder: Secondary | ICD-10-CM | POA: Diagnosis not present

## 2023-11-18 NOTE — Progress Notes (Signed)
   Doree Barthel, LCSW

## 2023-11-18 NOTE — Progress Notes (Signed)
 [facility] Counselor/Therapist Progress Note  Patient ID: [name], MRN: [id number],    Date: 11/18/2023  Time Spent: 2:35pm - 3:42pm : 67 minutes   Treatment Type: Individual Therapy  Reported Symptoms: ruminating thoughts, muscle tension, feeling flushed when feeling anxious  Mental Status Exam: Appearance:  Neat and Well Groomed     Behavior: Appropriate  Motor: Normal  Speech/Language:  Clear and Coherent and Normal Rate  Affect: Appropriate  Mood: normal  Thought process: normal  Thought content:   WNL  Sensory/Perceptual disturbances:   WNL  Orientation: oriented to person, place, and situation  Attention: Good  Concentration: Good  Memory: WNL  Fund of knowledge:  Good  Insight:   Good  Judgment:  Good  Impulse Control: Good   Risk Assessment: Danger to Self:  No Patient denied current suicidal ideation  Self-injurious Behavior: No Danger to Others: No Patient denied current homicidal ideation Duty to Warn:no Physical Aggression / Violence:No  Access to Firearms a concern: No  Gang Involvement:No   Subjective: Patient stated, "I was much more up so to speak" in reference to patient's mood last session. Patient stated, "its gotten a little bit better", "today's not a good day, today I don't feel good". Patient stated, "its ok" in response to patient's current mood. Patient reported he experiences ruminating thoughts and stated the thoughts, "take off" when patient feels anxious. Patient reported he experiences muscle tension and feeling flushed when thoughts "take off". Patient reported he is currently taking two and a half ativan each day and reported his dosage has decreased from four to two and a half ativan each day. Patient reported he has a follow up appointment with patient's PCP in April. Patient stated, "I feel better after coming in to talk" in reference to participation in therapy. Patient reported the tone of voice an individual uses can  trigger ruminating thoughts. Patient reported he plans to discuss the recommendation regarding a consultation with a psychiatrist with patient's PCP during patient's upcoming appointment in April. Patient indicated he is open to a referral to a therapist with expertise in treatment of trauma.   Interventions: Motivational Interviewing and psycho education . Clinician conducted session in person at clinician's office at [facility]. Reviewed events since last session. Assessed patient's mood since last session and current mood. Clinician reviewed diagnoses and treatment recommendations. Provided psycho education related to diagnoses and treatment. Discussed clinician's scope of practice and the importance of connecting patient with a provider with expertise in treatment of trauma. Clinician will initiate a referral to a provider with expertise in treatment of trauma.   Collaboration of Care: Other discussed consent required for clinician to initiate referral    Patient/Guardian was advised Release of Information must be obtained prior to any record release in order to collaborate their care with an outside provider.   Diagnosis:Generalized anxiety disorder  Trauma and stressor-related disorder  Plan: Clinician will initiate a referral to a provider with expertise in treatment of trauma.   Doree Barthel, LCSW

## 2023-11-19 ENCOUNTER — Other Ambulatory Visit: Payer: Self-pay | Admitting: Internal Medicine

## 2023-11-19 DIAGNOSIS — N281 Cyst of kidney, acquired: Secondary | ICD-10-CM | POA: Diagnosis not present

## 2023-11-19 DIAGNOSIS — N401 Enlarged prostate with lower urinary tract symptoms: Secondary | ICD-10-CM | POA: Diagnosis not present

## 2023-11-19 DIAGNOSIS — R3914 Feeling of incomplete bladder emptying: Secondary | ICD-10-CM | POA: Diagnosis not present

## 2023-12-03 ENCOUNTER — Telehealth (INDEPENDENT_AMBULATORY_CARE_PROVIDER_SITE_OTHER): Payer: Self-pay | Admitting: Otolaryngology

## 2023-12-03 NOTE — Telephone Encounter (Signed)
 Confirmed appt & location [id number]

## 2023-12-04 ENCOUNTER — Encounter (INDEPENDENT_AMBULATORY_CARE_PROVIDER_SITE_OTHER): Payer: Self-pay

## 2023-12-04 ENCOUNTER — Ambulatory Visit (INDEPENDENT_AMBULATORY_CARE_PROVIDER_SITE_OTHER): Payer: Medicare PPO | Admitting: Otolaryngology

## 2023-12-04 VITALS — BP 131/77 | HR 68 | Ht 72.0 in | Wt 180.0 lb

## 2023-12-04 DIAGNOSIS — H903 Sensorineural hearing loss, bilateral: Secondary | ICD-10-CM | POA: Diagnosis not present

## 2023-12-04 DIAGNOSIS — H6992 Unspecified Eustachian tube disorder, left ear: Secondary | ICD-10-CM | POA: Diagnosis not present

## 2023-12-04 DIAGNOSIS — H9313 Tinnitus, bilateral: Secondary | ICD-10-CM

## 2023-12-04 NOTE — Progress Notes (Signed)
 Dear Dr. Jonny Ruiz, Here is my assessment for our mutual patient, [name]. Thank you for allowing me the opportunity to care for your patient. Please do not hesitate to contact me should you have any other questions. Sincerely, Dr. Jovita Kussmaul  Otolaryngology Clinic Note Referring provider: Dr. Jonny Ruiz HPI:  [name] is a 73 y.o. male kindly referred by Dr. Jonny Ruiz for evaluation of hearing loss.  Patient reports: ongoing hearing loss, and bilateral tinnitus (stable mostly), for several years. He reports that it is getting worse, worse in background noise. Not sudden HL. Tinnitus not bothersome. He saw Audiology in Nov 2024, who noted asymmetry and referred him here. He corroborates this history, and additionally reports intermittent episodic popping in the left ear and some fullness (only lasts a few hours - "feels like you're going through mountains"), mostly associated when he is eating. Goes away on its own. He does have a history of TMJ (dentist noted some crepitus) and is now chewing on left side which helps. No facial weakness, numbness.  Patient currently denies: ear pain, frank vertigo, drainage Patient currently additionally denies: deep pain in ear canal, eustachian tube symptoms such as popping/crackling Patient also denies barotrauma, vestibular suppressant use, ototoxic medication use Prior ear surgery: no  No frequent ear infections.  Had noise exposure as a child - around guns as a child, fair amount of music exposure No FHX of HL  H&N Surgery: no Personal or FHx of bleeding dz or anesthesia difficulty: no  AP/AC: ASA 81  Tobacco: no  Lives in [place], [place]  PMHx: HTN, CAD, HF, GERD, T2DM, CKD, MDD, Secondary Hyperparathryoidism  Independent Review of Additional Tests or Records:  [name] 08/13/2023 (AuD) notes: decreased hearing, difficulty hearing in background noise. Bilateral tinnitus and aural fullness;  07/2023 Audiogram was independently reviewed and  interpreted by me and it reveals: b/l downsloping SNHL, normal down to moderately-severe, A/A tymps; there is asymmetry ~10-15 dB from 3-6 kHz.SRT 100% AD at 70dB, 96% AS at 70dB  SNHL= Sensorineural hearing loss  PCP Dr. Jonny Ruiz (07/09/2023): noted bilateral worsening HL; Ref to audiology MRI Brain w/o 01/09/2021: independently reviewed and interpeted with respect to ears: b/l mastoid effusions, no retrocochlear lesions; no DWI restriction over IAC area [facility] 01/07/2021: independently reviewed and interpeted with respect to ears: b/l mastoid effusions partial; cuts thick so suboptimal but no noted otic capusle or ossicular chain pathology WBC 06/18/2023: WBC 5.7, Plt 177  PMH/Meds/All/SocHx/FamHx/ROS:   Past Medical History:  Diagnosis Date   Anoxic brain injury (HCC)    Anxiety    Anxiety    Arthritis    Bilateral hearing loss    Cancer (HCC) 2010   basal cell ca, head   Cataract    bilateral   CHF (congestive heart failure) (HCC)    Chronic systolic heart failure (HCC)    Coronary artery disease    Depression    ED (erectile dysfunction)    GERD (gastroesophageal reflux disease)    Heart failure (HCC)    History of heart artery stent    History of ST elevation myocardial infarction (STEMI)    Hyperlipidemia    Hypertension    Kidney stone 07/29/2011   passed    Mood disorder (HCC)    Myocardial infarction (HCC)    Neuromuscular disorder (HCC)    neuropathy legs   Psoriasis    Stage 3b chronic kidney disease (CKD) (HCC)    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 Oren Bracket at [facility]      Past Surgical History:  Procedure Laterality Date   BASAL CELL CARCINOMA EXCISION     removed from scalp   CATARACT EXTRACTION W/PHACO Left 07/13/2023   Procedure: CATARACT EXTRACTION PHACO AND INTRAOCULAR LENS PLACEMENT (IOC) LEFT DIABETIC 3.20 00:29.8;  Surgeon: Nevada Crane, MD;  Location: [facility];  Service: Ophthalmology;  Laterality: Left;   CATARACT EXTRACTION W/PHACO Right 08/03/2023   Procedure: CATARACT EXTRACTION PHACO AND INTRAOCULAR LENS PLACEMENT (IOC) RIGHT DIABETIC 2.37 00:22.9;  Surgeon: Nevada Crane, MD;  Location: [facility];  Service: Ophthalmology;  Laterality: Right;   COLONOSCOPY  07/03/2017   per Dr. Adela Lank, sessile serrated polyp, repeat in 5 yrs    HERNIA REPAIR     LEFT HEART CATH AND CORONARY ANGIOGRAPHY N/A 01/03/2021   Procedure: LEFT HEART CATH AND CORONARY ANGIOGRAPHY;  Surgeon: Lennette Bihari, MD;  Location: [facility];  Service: Cardiovascular;  Laterality: N/A;   RIGHT HEART CATH N/A 01/03/2021   Procedure: RIGHT HEART CATH;  Surgeon: Dolores Patty, MD;  Location: [facility];  Service: Cardiovascular;  Laterality: N/A;    Family History  Problem Relation Age of Onset   Hypertension Mother    Hyperlipidemia Mother    Depression Other    Diabetes Other    Hyperlipidemia Other    Hypertension Other    Alzheimer's disease Other    Colon cancer Neg Hx    Stomach cancer Neg Hx    Rectal cancer Neg Hx    Esophageal cancer Neg Hx      Social Connections: Unknown (02/15/2023)   Social Connection and Isolation Panel [NHANES]    Frequency of Communication with Friends and Family: More than three times a week    Frequency of Social Gatherings with Friends and Family: Twice a week    Attends Religious Services: Patient declined    Database administrator or Organizations: Yes    Attends Engineer, structural: More than 4 times per year    Marital Status: Never married  Recent Concern: Social Connections - Moderately Isolated (12/15/2022)   Social Connection and Isolation Panel [NHANES]    Frequency of Communication with Friends and Family: More than three times a week    Frequency of Social Gatherings with Friends and Family: More than three times a week    Attends Religious Services: Never     Database administrator or Organizations: Yes    Attends Engineer, structural: More than 4 times per year    Marital Status: Never married      Current Outpatient Medications:    acetaminophen (TYLENOL) 650 MG CR tablet, Take 650 mg by mouth every 8 (eight) hours as needed for pain., Disp: , Rfl:    aspirin 81 MG chewable tablet, Chew 1 tablet (81 mg total) by mouth daily., Disp: 90 tablet, Rfl: 0   carvedilol (COREG) 3.125 MG tablet, TAKE ONE TABLET BY MOUTH TWICE DAILY, Disp: 180 tablet, Rfl: 3   Cholecalciferol (VITAMIN D) 50 MCG (2000 UT) CAPS, Take 1 capsule by mouth daily., Disp: , Rfl:    DULoxetine HCl 40 MG CPEP, Take 1 capsule (40 mg total) by mouth daily., Disp: 30 capsule, Rfl: 5   ENTRESTO 24-26 MG, TAKE ONE TABLET by MOUTH twice daily, Disp: 60 tablet, Rfl: 11   FARXIGA 10 MG TABS tablet, TAKE ONE  TABLET BY MOUTH BEFORE BREAKFAST, Disp: 90 tablet, Rfl: 3   finasteride ([name]) 5 MG tablet, Take 1 tablet (5 mg total) by mouth daily., Disp: 90 tablet, Rfl: 3   ketoconazole (NIZORAL) 2 % cream, Apply 1 application topically daily as needed for irritation., Disp: , Rfl:    LORazepam (ATIVAN) 2 MG tablet, TAKE A HALF TO 1 TABLET BY MOUTH THREE TIMES A DAY AS NEEDED FOR ANXIETY, Disp: 90 tablet, Rfl: 2   melatonin 3 MG TABS tablet, Take 1 tablet by mouth daily., Disp: , Rfl:    nitroGLYCERIN (NITROSTAT) 0.4 MG SL tablet, Place 1 tablet (0.4 mg total) under the tongue every 5 (five) minutes as needed for chest pain., Disp: 50 tablet, Rfl: 3   rosuvastatin (CRESTOR) 10 MG tablet, TAKE ONE TABLET BY MOUTH ONCE DAILY, Disp: 90 tablet, Rfl: 3   sildenafil (REVATIO) 20 MG tablet, Take 20 mg by mouth as needed., Disp: , Rfl:    tiZANidine (ZANAFLEX) 4 MG tablet, TAKE 1 TABLET BY MOUTH AT BEDTIME, Disp: 30 tablet, Rfl: 1   diclofenac Sodium (VOLTAREN) 1 % GEL, Apply 2 g topically 2 (two) times daily as needed (pain). (Patient not taking: Reported on 12/04/2023), Disp: , Rfl:     omeprazole (PRILOSEC) 20 MG capsule, Take 1 capsule (20 mg total) by mouth daily as needed. (Patient not taking: Reported on 12/04/2023), Disp: 90 capsule, Rfl: 3   promethazine (PHENERGAN) 6.25 MG/5ML solution, Take 5 mLs (6.25 mg total) by mouth every 6 (six) hours as needed for up to 6 days. For cough, Disp: 120 mL, Rfl: 0   triamcinolone cream (KENALOG) 0.1 %, Apply 1 application topically 2 (two) times daily as needed (psoriasis). (Patient not taking: Reported on 12/04/2023), Disp: , Rfl:    Physical Exam:   BP 131/77 (BP Location: Left Arm, Patient Position: Sitting, Cuff Size: Normal)   Pulse 68   Ht 6' (1.829 m)   Wt 180 lb (81.6 kg)   SpO2 97%   BMI 24.41 kg/m   Salient findings:  CN II-XII intact Given history and complaints, ear microscopy was indicated and performed for evaluation with findings as below in physical exam section and in procedures Bilateral EAC clear and TM intact with well pneumatized middle ear spaces Mild b/l TMJ crepitus Weber 512: mid Rinne 512: AC > BC b/l  Anterior rhinoscopy: Septum intact; bilateral inferior turbinates without significant hypertrophy; no purulence No lesions of oral cavity/oropharynx; dentition fair No obviously palpable neck masses/lymphadenopathy/thyromegaly No respiratory distress or stridor  Seprately Identifiable Procedures:  Procedure: Bilateral ear microscopy using microscope (CPT [id number]) Pre-procedure diagnosis: asymmetric hearing loss Post-procedure diagnosis: same Indication:; given patient's otologic complaints and history, for improved and comprehensive examination of external ear and tympanic membrane, bilateral otologic examination using microscope was performed  Procedure: Patient was placed semi-recumbent. Both ear canals were examined using the microscope with findings above. Patient tolerated the procedure well.   Impression & Plans:  [name] is a 73 y.o. male with:  1. Asymmetric SNHL (sensorineural  hearing loss)   2. Bilateral sensorineural hearing loss   3. Bilateral tinnitus   4. Dysfunction of left eustachian tube    Noted asymmetric on HT in 2024. Tinnitus is bilateral, non-pulsatile, not bothersome. He does not wish for HA currently. We also discussed that his intermittent popping most likely is related to his ETD but since the problem is rare/intermittent, do not think sprays are warranted especially with A/A tymps and TMJ  Will obtain  MRI IAC F/u PRN, depending on MRI results Advised to recheck hearing in 2 years, sooner if any concerns  See below regarding exact medications prescribed this encounter including dosages and route: No orders of the defined types were placed in this encounter.     Thank you for allowing me the opportunity to care for your patient. Please do not hesitate to contact me should you have any other questions.  Sincerely, Jovita Kussmaul, MD Otolaryngologist (ENT), [facility] Phone: [phone] Fax: [phone]  12/04/2023, 2:27 PM   I have personally spent 50 minutes involved in face-to-face and non-face-to-face activities for this patient on the day of the visit.  Professional time spent excludes any procedures performed but includes the following activities, in addition to those noted in the documentation: preparing to see the patient (review of outside documentation and results), performing a medically appropriate examination, counseling, documenting in the electronic health record, independently interpreting results.

## 2023-12-04 NOTE — Patient Instructions (Signed)
 I have ordered an imaging study for you to complete prior to your next visit. Please call [facility] Scheduling at [phone] to schedule your imaging if you have not received a call within 24 hours. If you are unable to complete your imaging study prior to your next scheduled visit please call our office to let us know.

## 2023-12-07 ENCOUNTER — Institutional Professional Consult (permissible substitution) (INDEPENDENT_AMBULATORY_CARE_PROVIDER_SITE_OTHER): Payer: Medicare PPO

## 2023-12-11 ENCOUNTER — Ambulatory Visit (HOSPITAL_COMMUNITY)
Admission: RE | Admit: 2023-12-11 | Discharge: 2023-12-11 | Disposition: A | Discharge status: Home / Self Care | Source: Ambulatory Visit | Attending: Otolaryngology | Admitting: Otolaryngology

## 2023-12-11 DIAGNOSIS — I6782 Cerebral ischemia: Secondary | ICD-10-CM | POA: Diagnosis not present

## 2023-12-11 DIAGNOSIS — H903 Sensorineural hearing loss, bilateral: Secondary | ICD-10-CM | POA: Diagnosis not present

## 2023-12-11 DIAGNOSIS — H905 Unspecified sensorineural hearing loss: Secondary | ICD-10-CM | POA: Diagnosis not present

## 2023-12-11 MED ORDER — GADOBUTROL 1 MMOL/ML IV SOLN
8.0000 mL | Freq: Once | INTRAVENOUS | Status: AC | PRN
  Administered 2023-12-11: 8 mL via INTRAVENOUS

## 2023-12-16 ENCOUNTER — Ambulatory Visit: Admitting: Licensed Clinical Social Worker

## 2023-12-16 DIAGNOSIS — F439 Reaction to severe stress, unspecified: Secondary | ICD-10-CM | POA: Diagnosis not present

## 2023-12-16 DIAGNOSIS — F411 Generalized anxiety disorder: Secondary | ICD-10-CM | POA: Insufficient documentation

## 2023-12-16 NOTE — Progress Notes (Signed)
 [name] Behavioral [facility] Counselor/Therapist Progress Note  Patient ID: [name], MRN: [id number]    Date: 12/16/23  Treatment Type: Treatment PLANNING   Individualized Treatment Plan Strengths: [name] is very educated and good at verbalizing his thoughts.  Supports: PT is supported by [name] his dear friend and A neighbor [name].   Goal/Needs for Treatment:  In order of importance to patient 1) "I want less episodes of anxiety." 2) "I want to know how to cope with my triggers." 3) "I want to improve my interactions when in new social settings."   Client Statement of Needs: "I want to experience more and continual happiness."   Treatment Level: Moderate  Symptoms:  Client Treatment Preferences: Face to Face/Cognitive Behavioral Therapy   Healthcare consumer's goal for treatment:  Counselor, [name] MSW will support the patient's ability to achieve the goals identified. Cognitive Behavioral Therapy, Assertive Communication/Conflict Resolution Training, Relaxation Training, ACT, Humanistic and other evidenced-based practices will be used to promote progress towards healthy functioning.   Healthcare consumer will: Actively participate in therapy, working towards healthy functioning.    *Justification for Continuation/Discontinuation of Goal: R=Revised, O=Ongoing, A=Achieved, D=Discontinued  Goal 1) "I want less episodes of anxiety." Baseline date 12/16/2023: Progress towards goal ongoing; How Often - Daily Target Date Goal Was reviewed Status Code Progress towards goal/Likert rating  12/15/2024  O             Relaxation Techniques: Deep Breathing: Slow, deep breaths can calm your nervous system and reduce feelings of anxiety.  Progressive Muscle Relaxation: Tense and release different muscle groups to become aware of tension and promote relaxation.  Mindfulness and Meditation: Focus on the present moment, reducing rumination and promoting a sense of calm.   Visualization: Imagine a peaceful scene to help calm your mind and body.  Yoga and Stretching: Combine physical activity with mindfulness to reduce anxiety.  Physical Activity and Lifestyle: Exercise: Regular physical activity releases endorphins, which act as natural mood boosters.  Healthy Diet: Focus on whole foods, fruits, vegetables, and lean protein.  Sufficient Sleep: Aim for 7-9 hours of quality sleep per night.  Limit Alcohol and Caffeine: These substances can exacerbate anxiety.  Cognitive and Behavioral Strategies: Identify Triggers: Recognize situations, thoughts, or feelings that trigger your anxiety.  Challenge Negative Thoughts: Examine the evidence for and against your anxious thoughts and replace them with more realistic ones.  Journaling: Write down your thoughts and feelings to process emotions and identify patterns.  Grounding Techniques: Use the 5-4-3-2-1 method or other techniques to stay present in the moment.  Problem-Solving: Break down large problems into smaller, manageable steps.  Social Support and Seeking Help: Reach Out to Others: Talk to friends, family, or a therapist about your anxiety.  Join a Support Group: Connect with others who are experiencing similar challenges.   Goal 2) "I want to know how to cope with my triggers." Baseline date 12/16/2023 Progress towards goal Ongoing; How Often - Daily Target Date Goal Was reviewed Status Code Progress towards goal  12/15/2024  O             Identifying Triggers: Self-Awareness: Pay attention to your thoughts, feelings, and physical sensations when you feel overwhelmed or reactive.  Recognize Patterns: Note situations, people, or places that consistently trigger strong emotions.  Mindfulness and Meditation: Practice mindfulness techniques like meditation and deep breathing to become more aware of your internal state.  Journaling: Write down your thoughts and feelings to gain insight into your triggers and  reactions.  Seek Professional Help: If you're struggling to identify or manage triggers, consider seeking guidance from a therapist or counselor.  Coping Skills for Managing Triggers: Mindfulness and Grounding: Focus on the present moment through mindfulness techniques and grounding exercises.  Deep Breathing: Use deep breathing exercises to calm your nervous system and reduce anxiety.  Relaxation Techniques: Practice relaxation techniques like progressive muscle relaxation or guided imagery.  Self-Soothing: Engage in activities that comfort and soothe you, such as listening to music, taking a warm bath, or spending time in nature.  Social Support: Connect with trusted friends, family, or support groups to share your experiences and receive encouragement.  Cognitive Behavioral Therapy (CBT): Consider CBT, which can help you identify and challenge negative thought patterns and behaviors.  Establish Boundaries: Set healthy boundaries with people and situations that trigger you.  Healthy Activities: Engage in healthy activities like exercise, spending time in nature, or pursuing hobbies.  Avoidance: If possible, avoid situations or environments that you know trigger you, but also consider developing coping skills to manage these triggers when unavoidable.  Take a Break: When you feel overwhelmed, take a break from the situation and engage in a calming activity.  Acknowledge and Accept Your Feelings: Allow yourself to feel your emotions without judgment, and remember that it's okay to need time to process them.   Suggested Reading: Emotional Triggers: What They Are and 9 Tips Deal With Them - [employer] Aug 19, 2022 -- How to deal with emotional triggers * Memorize your reaction. Every emotion has an accompanying physical sensation. [name] 7 Types of Triggers and 7 Coping Strategies - Care Counseling Learn ways to cope with triggers, establish safety and stability in your environment,  and regulate your body's response. Focus on ...  Care Counseling Coping Skills in Recovery: How Do I Handle Triggers? [facility] Sep 01, 2022 -- Healthy Coping Methods for Trigger Management. An essential step to identifying triggers and managing them is to be se...   Goal 3) "I want to improve my interactions when in new social settings." Baseline date 12/16/2023: Progress towards goal Ongoing; How Often - Daily Target Date Goal Was reviewed Status Code Progress towards goal  12/15/2024  O             Coping skills for improving socialization: 1. Active Listening & Engagement: Listen more than you talk: Pay attention to what others are saying and show genuine interest in their thoughts and experiences.  Ask follow-up questions: Demonstrate your engagement by asking questions that encourage further conversation and deeper understanding.  Focus on the other person: Make an effort to understand their perspective and avoid dominating the conversation.  2. Nonverbal Communication: Make eye contact: Eye contact can signal attentiveness and build rapport.  Use open body language: Stand or sit with an open posture, uncrossed arms and legs, and a relaxed demeanor.  Smile: A genuine smile can make you appear approachable and friendly.  3. Initiating Conversations: Start with small talk: Begin with simple, neutral topics to ease into a conversation.  Ask open-ended questions: Avoid questions that can be answered with a simple "yes" or "no".  Find common ground: Look for shared interests or experiences to build a connection.  4. Building Relationships: Share about yourself: Don't be afraid to share your own thoughts and experiences, but also be mindful of not dominating the conversation.  Give compliments: A sincere compliment can brighten someone's day and build a positive connection.  Be authentic: Be yourself  and let your personality shine through.  5. Coping with Social  Anxiety: Practice mindfulness: Focus on the present moment and try to reduce anxiety by practicing relaxation techniques like deep breathing.  Challenge negative thoughts: Identify and challenge any negative thoughts or self-criticism that might be holding you back.  Start small: Begin by engaging in social situations that feel comfortable and gradually work your way up to more challenging ones.  Seek support: Don't hesitate to reach out to friends, family  for support.  Be kind to yourself: Acknowledge your efforts and celebrate your successes, no matter how small.  This plan has been reviewed and created by the following participants:  This plan will be reviewed at least every 12 months. Date Behavioral Health Clinician Date Guardian/Patient   12/16/2023 [name] MSW,LCSW  12/16/2023 Verbal Consent Provided
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
## 2023-12-22 ENCOUNTER — Encounter: Payer: Medicare PPO | Admitting: Family Medicine

## 2023-12-28 DIAGNOSIS — L821 Other seborrheic keratosis: Secondary | ICD-10-CM | POA: Diagnosis not present

## 2023-12-28 DIAGNOSIS — L570 Actinic keratosis: Secondary | ICD-10-CM | POA: Diagnosis not present

## 2023-12-28 DIAGNOSIS — D485 Neoplasm of uncertain behavior of skin: Secondary | ICD-10-CM | POA: Diagnosis not present

## 2023-12-28 DIAGNOSIS — Z85828 Personal history of other malignant neoplasm of skin: Secondary | ICD-10-CM | POA: Diagnosis not present

## 2023-12-28 DIAGNOSIS — L309 Dermatitis, unspecified: Secondary | ICD-10-CM | POA: Diagnosis not present

## 2023-12-28 DIAGNOSIS — L814 Other melanin hyperpigmentation: Secondary | ICD-10-CM | POA: Diagnosis not present

## 2023-12-29 ENCOUNTER — Other Ambulatory Visit

## 2023-12-29 DIAGNOSIS — E538 Deficiency of other specified B group vitamins: Secondary | ICD-10-CM

## 2023-12-29 DIAGNOSIS — E119 Type 2 diabetes mellitus without complications: Secondary | ICD-10-CM

## 2023-12-29 DIAGNOSIS — Z7984 Long term (current) use of oral hypoglycemic drugs: Secondary | ICD-10-CM | POA: Diagnosis not present

## 2023-12-29 DIAGNOSIS — N2581 Secondary hyperparathyroidism of renal origin: Secondary | ICD-10-CM | POA: Diagnosis not present

## 2023-12-29 DIAGNOSIS — Z125 Encounter for screening for malignant neoplasm of prostate: Secondary | ICD-10-CM | POA: Diagnosis not present

## 2023-12-29 DIAGNOSIS — E559 Vitamin D deficiency, unspecified: Secondary | ICD-10-CM | POA: Diagnosis not present

## 2023-12-29 LAB — CBC WITH DIFFERENTIAL/PLATELET
Basophils Absolute: 0 10*3/uL (ref 0.0–0.1)
Basophils Relative: 0.8 % (ref 0.0–3.0)
Eosinophils Absolute: 0.1 10*3/uL (ref 0.0–0.7)
Eosinophils Relative: 2.4 % (ref 0.0–5.0)
HCT: 46.4 % (ref 39.0–52.0)
Hemoglobin: 15.6 g/dL (ref 13.0–17.0)
Lymphocytes Relative: 21.9 % (ref 12.0–46.0)
Lymphs Abs: 0.9 10*3/uL (ref 0.7–4.0)
MCHC: 33.6 g/dL (ref 30.0–36.0)
MCV: 93.6 fl (ref 78.0–100.0)
Monocytes Absolute: 0.4 10*3/uL (ref 0.1–1.0)
Monocytes Relative: 9 % (ref 3.0–12.0)
Neutro Abs: 2.7 10*3/uL (ref 1.4–7.7)
Neutrophils Relative %: 65.9 % (ref 43.0–77.0)
Platelets: 155 10*3/uL (ref 150.0–400.0)
RBC: 4.96 Mil/uL (ref 4.22–5.81)
RDW: 13.3 % (ref 11.5–15.5)
WBC: 4.1 10*3/uL (ref 4.0–10.5)

## 2023-12-29 LAB — BASIC METABOLIC PANEL WITH GFR
BUN: 29 mg/dL — ABNORMAL HIGH (ref 6–23)
CO2: 25 meq/L (ref 19–32)
Calcium: 9.2 mg/dL (ref 8.4–10.5)
Chloride: 103 meq/L (ref 96–112)
Creatinine, Ser: 1.3 mg/dL (ref 0.40–1.50)
GFR: 54.58 mL/min — ABNORMAL LOW (ref >60.00)
Glucose, Bld: 108 mg/dL — ABNORMAL HIGH (ref 70–99)
Potassium: 3.6 meq/L (ref 3.5–5.1)
Sodium: 139 meq/L (ref 135–145)

## 2023-12-29 LAB — HEPATIC FUNCTION PANEL
ALT: 19 U/L (ref 0–53)
AST: 20 U/L (ref 0–37)
Albumin: 4.5 g/dL (ref 3.5–5.2)
Alkaline Phosphatase: 39 U/L (ref 39–117)
Bilirubin, Direct: 0.1 mg/dL (ref 0.0–0.3)
Total Bilirubin: 0.5 mg/dL (ref 0.2–1.2)
Total Protein: 6.7 g/dL (ref 6.0–8.3)

## 2023-12-29 LAB — LIPID PANEL
Cholesterol: 156 mg/dL (ref 0–200)
HDL: 52.5 mg/dL (ref >39.00)
LDL Cholesterol: 85 mg/dL (ref 0–99)
NonHDL: 103.23
Total CHOL/HDL Ratio: 3
Triglycerides: 90 mg/dL (ref 0.0–149.0)
VLDL: 18 mg/dL (ref 0.0–40.0)

## 2023-12-29 LAB — URINALYSIS, ROUTINE W REFLEX MICROSCOPIC
Bilirubin Urine: NEGATIVE
Hgb urine dipstick: NEGATIVE
Ketones, ur: NEGATIVE
Leukocytes,Ua: NEGATIVE
Nitrite: NEGATIVE
RBC / HPF: NONE SEEN (ref 0–?)
Specific Gravity, Urine: 1.01 (ref 1.000–1.030)
Total Protein, Urine: NEGATIVE
Urine Glucose: >=1000 — AB
Urobilinogen, UA: 0.2 (ref 0.0–1.0)
WBC, UA: NONE SEEN (ref 0–?)
pH: 6 (ref 5.0–8.0)

## 2023-12-29 LAB — TSH: TSH: 1.62 u[IU]/mL (ref 0.35–5.50)

## 2023-12-29 LAB — PSA: PSA: 0.91 ng/mL (ref 0.10–4.00)

## 2023-12-29 LAB — MICROALBUMIN / CREATININE URINE RATIO
Creatinine,U: 66.8 mg/dL
Microalb Creat Ratio: 56.7 mg/g — ABNORMAL HIGH (ref 0.0–30.0)
Microalb, Ur: 3.8 mg/dL — ABNORMAL HIGH (ref 0.0–1.9)

## 2023-12-29 LAB — HEMOGLOBIN A1C: Hgb A1c MFr Bld: 5.8 % (ref 4.6–6.5)

## 2023-12-29 LAB — VITAMIN D 25 HYDROXY (VIT D DEFICIENCY, FRACTURES): VITD: 48.71 ng/mL (ref 30.00–100.00)

## 2023-12-29 LAB — VITAMIN B12: Vitamin B-12: 353 pg/mL (ref 211–911)

## 2023-12-31 ENCOUNTER — Ambulatory Visit: Admitting: Licensed Clinical Social Worker

## 2023-12-31 LAB — PTH, INTACT AND CALCIUM
Calcium: 9.2 mg/dL (ref 8.6–10.3)
PTH: 80 pg/mL — ABNORMAL HIGH (ref 16–77)

## 2024-01-04 ENCOUNTER — Ambulatory Visit: Payer: Medicare PPO | Admitting: Internal Medicine

## 2024-01-04 ENCOUNTER — Encounter: Payer: Self-pay | Admitting: Internal Medicine

## 2024-01-04 VITALS — BP 120/72 | HR 60 | Temp 99.1°F | Ht 72.0 in | Wt 182.0 lb

## 2024-01-04 DIAGNOSIS — N1832 Chronic kidney disease, stage 3b: Secondary | ICD-10-CM

## 2024-01-04 DIAGNOSIS — Z0000 Encounter for general adult medical examination without abnormal findings: Secondary | ICD-10-CM | POA: Diagnosis not present

## 2024-01-04 DIAGNOSIS — Z7984 Long term (current) use of oral hypoglycemic drugs: Secondary | ICD-10-CM

## 2024-01-04 DIAGNOSIS — I10 Essential (primary) hypertension: Secondary | ICD-10-CM

## 2024-01-04 DIAGNOSIS — E559 Vitamin D deficiency, unspecified: Secondary | ICD-10-CM | POA: Diagnosis not present

## 2024-01-04 DIAGNOSIS — E1122 Type 2 diabetes mellitus with diabetic chronic kidney disease: Secondary | ICD-10-CM | POA: Diagnosis not present

## 2024-01-04 DIAGNOSIS — F411 Generalized anxiety disorder: Secondary | ICD-10-CM

## 2024-01-04 DIAGNOSIS — E782 Mixed hyperlipidemia: Secondary | ICD-10-CM

## 2024-01-04 DIAGNOSIS — Z0001 Encounter for general adult medical examination with abnormal findings: Secondary | ICD-10-CM | POA: Insufficient documentation

## 2024-01-04 MED ORDER — NITROGLYCERIN 0.4 MG SL SUBL
0.4000 mg | SUBLINGUAL_TABLET | SUBLINGUAL | 3 refills | Status: AC | PRN
Start: 2024-01-04 — End: ?

## 2024-01-04 MED ORDER — DULOXETINE HCL 40 MG PO CPEP
40.0000 mg | ORAL_CAPSULE | Freq: Every day | ORAL | 3 refills | Status: AC
Start: 2024-01-04 — End: ?

## 2024-01-04 MED ORDER — FUROSEMIDE 40 MG PO TABS: ORAL_TABLET | ORAL | 3 refills | Status: AC

## 2024-01-04 MED ORDER — ROSUVASTATIN CALCIUM 20 MG PO TABS: 20.0000 mg | ORAL_TABLET | Freq: Every day | ORAL | 3 refills | Status: DC

## 2024-01-04 NOTE — Progress Notes (Unsigned)
 Patient ID: [name], male   DOB: 1950-10-09, 73 y.o.   MRN: [id number]         Chief Complaint:: wellness exam and hld, htn, dm, low vit d, anxiety       HPI:  [name] is a 73 y.o. male here for wellness exam; for tdap at pharmacy, o/w up to date                        Also now seeling [name] for therapy at [facility].  Eeing twice per month for GAD and post trauma stress disorder;.Had MRI abd with urology for possible right kidney mass but neg.  Pt denies chest pain, increased sob or doe, wheezing, orthopnea, PND, increased LE swelling, palpitations, dizziness or syncope.   Pt denies polydipsia, polyuria, or new focal neuro s/s.    Pt denies fever, wt loss, night sweats, loss of appetite, or other constitutional symptoms   Pt has had mild memory changes not sure how much is anxiety.     Wt Readings from Last 3 Encounters:  01/04/24 182 lb (82.6 kg)  12/04/23 180 lb (81.6 kg)  08/28/23 183 lb 6.4 oz (83.2 kg)   BP Readings from Last 3 Encounters:  01/04/24 120/72  12/04/23 131/77  08/28/23 (!) 140/80   Immunization History  Administered Date(s) Administered   Fluad Quad(high Dose 65+) 06/15/2019, 05/30/2021, 06/11/2022   Influenza Split 08/13/2011, 06/08/2012, 06/22/2013   Influenza Whole 06/22/2005, 06/19/2010   Influenza, High Dose Seasonal PF 07/05/2017, 05/28/2020   Influenza-Unspecified 07/14/2015, 07/12/2016, 07/05/2017, 06/04/2023   PFIZER Comirnaty(Gray Top)Covid-19 Tri-Sucrose Vaccine 05/10/2021   PFIZER(Purple Top)SARS-COV-2 Vaccination 10/31/2019, 11/26/2019, 07/07/2020, 06/21/2022, 06/11/2023   Pfizer Covid-19 Vaccine Bivalent Booster 80yrs & up 08/08/2021   Pneumococcal Conjugate-13 08/11/2013   Pneumococcal Polysaccharide-23 04/23/2016   Respiratory Syncytial Virus Vaccine,Recomb Aduvanted(Arexvy) 07/24/2022   Td 06/19/2010   Tdap 09/12/2013   Zoster Recombinant(Shingrix) 12/20/2021, 04/11/2022   Zoster, Live 08/11/2013   Health Maintenance Due   Topic Date Due   DTaP/Tdap/Td (3 - Td or Tdap) 09/13/2023   Medicare Annual Wellness (AWV)  12/15/2023      Past Medical History:  Diagnosis Date   Anoxic brain injury (HCC)    Anxiety    Anxiety    Arthritis    Bilateral hearing loss    Cancer (HCC) 2010   basal cell ca, head   Cataract    bilateral   CHF (congestive heart failure) (HCC)    Chronic systolic heart failure (HCC)    Coronary artery disease    Depression    ED (erectile dysfunction)    GERD (gastroesophageal reflux disease)    Heart failure (HCC)    History of heart artery stent    History of ST elevation myocardial infarction (STEMI)    Hyperlipidemia    Hypertension    Kidney stone 07/29/2011   passed    Mood disorder (HCC)    Myocardial infarction (HCC)    Neuromuscular disorder (HCC)    neuropathy legs   Psoriasis    Stage 3b chronic kidney disease (CKD) (HCC)    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 Sydnor at [facility]    Past Surgical History:  Procedure Laterality Date   BASAL CELL CARCINOMA EXCISION     removed from scalp   CATARACT EXTRACTION W/PHACO Left 07/13/2023   Procedure: CATARACT EXTRACTION  PHACO AND INTRAOCULAR LENS PLACEMENT (IOC) LEFT DIABETIC 3.20 00:29.8;  Surgeon: Rosa College, MD;  Location: [facility];  Service: Ophthalmology;  Laterality: Left;   CATARACT EXTRACTION W/PHACO Right 08/03/2023   Procedure: CATARACT EXTRACTION PHACO AND INTRAOCULAR LENS PLACEMENT (IOC) RIGHT DIABETIC 2.37 00:22.9;  Surgeon: Rosa College, MD;  Location: [facility];  Service: Ophthalmology;  Laterality: Right;   COLONOSCOPY  07/03/2017   per Dr. General Kenner, sessile serrated polyp, repeat in 5 yrs    HERNIA REPAIR     LEFT HEART CATH AND CORONARY ANGIOGRAPHY N/A 01/03/2021   Procedure: LEFT HEART CATH AND CORONARY ANGIOGRAPHY;  Surgeon: Millicent Ally, MD;  Location: [facility];   Service: Cardiovascular;  Laterality: N/A;   RIGHT HEART CATH N/A 01/03/2021   Procedure: RIGHT HEART CATH;  Surgeon: Mardell Shade, MD;  Location: [facility];  Service: Cardiovascular;  Laterality: N/A;    reports that he has never smoked. He has been exposed to tobacco smoke. He has never used smokeless tobacco. He reports that he does not drink alcohol and does not use drugs. family history includes Alzheimer's disease in an other family member; Depression in an other family member; Diabetes in an other family member; Hyperlipidemia in his mother and another family member; Hypertension in his mother and another family member. Allergies  Allergen Reactions   Codeine Other (See Comments)    hallucinations   Lisinopril Cough   Nsaids     Not supposed to take because of kidneys    Current Outpatient Medications on File Prior to Visit  Medication Sig Dispense Refill   acetaminophen (TYLENOL) 650 MG CR tablet Take 650 mg by mouth every 8 (eight) hours as needed for pain.     aspirin 81 MG chewable tablet Chew 1 tablet (81 mg total) by mouth daily. 90 tablet 0   carvedilol (COREG) 3.125 MG tablet TAKE ONE TABLET BY MOUTH TWICE DAILY 180 tablet 3   Cholecalciferol (VITAMIN D) 50 MCG (2000 UT) CAPS Take 1 capsule by mouth daily.     diclofenac Sodium (VOLTAREN) 1 % GEL Apply 2 g topically 2 (two) times daily as needed (pain).     ENTRESTO 24-26 MG TAKE ONE TABLET by MOUTH twice daily 60 tablet 11   FARXIGA 10 MG TABS tablet TAKE ONE TABLET BY MOUTH BEFORE BREAKFAST 90 tablet 3   finasteride ([name]) 5 MG tablet Take 1 tablet (5 mg total) by mouth daily. 90 tablet 3   ketoconazole (NIZORAL) 2 % cream Apply 1 application topically daily as needed for irritation.     LORazepam (ATIVAN) 2 MG tablet TAKE A HALF TO 1 TABLET BY MOUTH THREE TIMES A DAY AS NEEDED FOR ANXIETY 90 tablet 2   melatonin 3 MG TABS tablet Take 1 tablet by mouth daily.     omeprazole (PRILOSEC) 20 MG capsule Take  1 capsule (20 mg total) by mouth daily as needed. 90 capsule 3   sildenafil (REVATIO) 20 MG tablet Take 20 mg by mouth as needed.     tiZANidine (ZANAFLEX) 4 MG tablet TAKE 1 TABLET BY MOUTH AT BEDTIME 30 tablet 1   triamcinolone cream (KENALOG) 0.1 % Apply 1 application  topically 2 (two) times daily as needed (psoriasis).     promethazine (PHENERGAN) 6.25 MG/5ML solution Take 5 mLs (6.25 mg total) by mouth every 6 (six) hours as needed for up to 6 days. For cough 120 mL 0   [DISCONTINUED] tadalafil (CIALIS)  20 MG tablet Take 20 mg by mouth daily as needed.       No current facility-administered medications on file prior to visit.        ROS:  All others reviewed and negative.  Objective        PE:  BP 120/72 (BP Location: Right Arm, Patient Position: Sitting, Cuff Size: Normal)   Pulse 60   Temp 99.1 F (37.3 C) (Oral)   Ht 6' (1.829 m)   Wt 182 lb (82.6 kg)   SpO2 99%   BMI 24.68 kg/m                 Constitutional: Pt appears in NAD               HENT: Head: NCAT.                Right Ear: External ear normal.                 Left Ear: External ear normal.                Eyes: . Pupils are equal, round, and reactive to light. Conjunctivae and EOM are normal               Nose: without d/c or deformity               Neck: Neck supple. Gross normal ROM               Cardiovascular: Normal rate and regular rhythm.                 Pulmonary/Chest: Effort normal and breath sounds without rales or wheezing.                Abd:  Soft, NT, ND, + BS, no organomegaly               Neurological: Pt is alert. At baseline orientation, motor grossly intact               Skin: Skin is warm. No rashes, no other new lesions, LE edema - none               Psychiatric: Pt behavior is normal without agitation , nervous  Micro: none  Cardiac tracings I have personally interpreted today:  none  Pertinent Radiological findings (summarize): none   Lab Results  Component Value Date   WBC 4.1  12/29/2023   HGB 15.6 12/29/2023   HCT 46.4 12/29/2023   PLT 155.0 12/29/2023   GLUCOSE 108 (H) 12/29/2023   CHOL 156 12/29/2023   TRIG 90.0 12/29/2023   HDL 52.50 12/29/2023   LDLDIRECT 141.9 04/06/2013   LDLCALC 85 12/29/2023   ALT 19 12/29/2023   AST 20 12/29/2023   NA 139 12/29/2023   K 3.6 12/29/2023   CL 103 12/29/2023   CREATININE 1.30 12/29/2023   BUN 29 (H) 12/29/2023   CO2 25 12/29/2023   TSH 1.62 12/29/2023   PSA 0.91 12/29/2023   INR 1.5 (H) 01/03/2021   HGBA1C 5.8 12/29/2023   MICROALBUR 3.8 (H) 12/29/2023   Assessment/Plan:  [name] is a 73 y.o. White or Caucasian [1] male with  has a past medical history of Anoxic brain injury (HCC), Anxiety, Anxiety, Arthritis, Bilateral hearing loss, Cancer (HCC) (2010), Cataract, CHF (congestive heart failure) (HCC), Chronic systolic heart failure (HCC), Coronary artery disease, Depression, ED (erectile dysfunction), GERD (gastroesophageal reflux disease), Heart failure (HCC), History of  heart artery stent, History of ST elevation myocardial infarction (STEMI), Hyperlipidemia, Hypertension, Kidney stone (07/29/2011), Mood disorder (HCC), Myocardial infarction (HCC), Neuromuscular disorder (HCC), Psoriasis, Stage 3b chronic kidney disease (CKD) (HCC), 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  Encounter for well adult exam with abnormal findings Age and sex appropriate education and counseling updated with regular exercise and diet Referrals for preventative services - none needed Immunizations addressed - for tdap at pharmacy Smoking counseling  - none needed Evidence for depression or other mood disorder - none significant Most recent labs reviewed. I have personally reviewed and have noted: 1) the patient's medical and social history 2) The patient's current medications and supplements 3) The patient's height, weight, and BMI have been recorded in the  chart   Hyperlipemia, mixed Lab Results  Component Value Date   LDLCALC 85 12/29/2023   uncontrolled, pt for increased crestor 20 mg qd   Essential hypertension BP Readings from Last 3 Encounters:  01/04/24 120/72  12/04/23 131/77  08/28/23 (!) 140/80   Stable, pt to continue medical treatment coreg 3.125 mg every day, entresto 24 26 mg   Type 2 diabetes mellitus with diabetic chronic kidney disease (HCC) Lab Results  Component Value Date   HGBA1C 5.8 12/29/2023   Stable, pt to continue current medical treatment farxiga 10 qd   Vitamin D deficiency Last vitamin D Lab Results  Component Value Date   VD25OH 48.71 12/29/2023   Stable, cont oral replacement   Generalized anxiety disorder Worsening, pt to continue counseling  Followup: Return in about 6 months (around 07/05/2024).  Rosalia Colonel, MD 01/05/2024 9:07 PM [facility]

## 2024-01-04 NOTE — Patient Instructions (Signed)
 Ok to increase the crestor to 20 mg per day  Please continue all other medications as before, and refills have been done if requested.  Please have the pharmacy call with any other refills you may need.  Please continue your efforts at being more active, low cholesterol diet, and weight control.  You are otherwise up to date with prevention measures today.  Please keep your appointments with your specialists as you may have planned  Please make an Appointment to return in 6 months, or sooner if needed, also with Lab Appointment for testing done 3-5 days before at the FIRST FLOOR Lab (so this is for TWO appointments - please see the scheduling desk as you leave)

## 2024-01-05 ENCOUNTER — Encounter: Payer: Self-pay | Admitting: Internal Medicine

## 2024-01-05 NOTE — Assessment & Plan Note (Signed)
 Last vitamin D Lab Results  Component Value Date   VD25OH 48.71 12/29/2023   Stable, cont oral replacement

## 2024-01-05 NOTE — Addendum Note (Signed)
 Addended by: Roslyn Coombe on: 01/05/2024 09:10 PM   Modules accepted: Orders

## 2024-01-05 NOTE — Assessment & Plan Note (Signed)
Age and sex appropriate education and counseling updated with regular exercise and diet Referrals for preventative services - none needed Immunizations addressed - for tdap at pharmacy Smoking counseling  - none needed Evidence for depression or other mood disorder - none significant Most recent labs reviewed. I have personally reviewed and have noted: 1) the patient's medical and social history 2) The patient's current medications and supplements 3) The patient's height, weight, and BMI have been recorded in the chart

## 2024-01-05 NOTE — Assessment & Plan Note (Signed)
 Lab Results  Component Value Date   LDLCALC 85 12/29/2023   uncontrolled, pt for increased crestor 20 mg qd

## 2024-01-05 NOTE — Assessment & Plan Note (Signed)
 BP Readings from Last 3 Encounters:  01/04/24 120/72  12/04/23 131/77  08/28/23 (!) 140/80   Stable, pt to continue medical treatment coreg 3.125 mg every day, entresto 24 26 mg

## 2024-01-05 NOTE — Assessment & Plan Note (Signed)
 Lab Results  Component Value Date   HGBA1C 5.8 12/29/2023   Stable, pt to continue current medical treatment farxiga 10 qd

## 2024-01-05 NOTE — Assessment & Plan Note (Signed)
 Worsening, pt to continue counseling

## 2024-01-13 ENCOUNTER — Ambulatory Visit (INDEPENDENT_AMBULATORY_CARE_PROVIDER_SITE_OTHER)

## 2024-01-13 VITALS — BP 128/62 | HR 68 | Ht 72.0 in | Wt 180.6 lb

## 2024-01-13 DIAGNOSIS — Z0000 Encounter for general adult medical examination without abnormal findings: Secondary | ICD-10-CM | POA: Diagnosis not present

## 2024-01-13 NOTE — Progress Notes (Signed)
 Subjective:   [name] is a 73 y.o. who presents for a Medicare Wellness preventive visit.  Visit Complete: In person  Persons Participating in Visit: Patient.  AWV Questionnaire: No: Patient Medicare AWV questionnaire was not completed prior to this visit.  Cardiac Risk Factors include: advanced age (>78men, >46 women);hypertension;dyslipidemia;diabetes mellitus;male gender     Objective:    Today's Vitals   01/13/24 1356  BP: 128/62  Pulse: 68  SpO2: 99%  Weight: 180 lb 9.6 oz (81.9 kg)  Height: 6' (1.829 m)   Body mass index is 24.49 kg/m.     01/13/2024    1:55 PM 08/03/2023    6:33 AM 07/13/2023    6:36 AM 12/15/2022   10:57 AM 07/28/2022   10:18 AM 12/09/2021   11:27 AM 06/09/2021   12:25 PM  Advanced Directives  Does Patient Have a Medical Advance Directive? Yes Yes Yes Yes Yes Yes No  Type of Estate agent of [place];Living will Healthcare Power of [employer];Living will Living will Healthcare Power of [place];Living will  Out of facility DNR (pink MOST or yellow form);Healthcare Power of [place];Living will   Does patient want to make changes to medical advance directive? No - Patient declined No - Patient declined No - Patient declined No - Patient declined Yes (ED - Information included in AVS)    Copy of Healthcare Power of Attorney in Chart? Yes - validated most recent copy scanned in chart (See row information) No - copy requested  Yes - validated most recent copy scanned in chart (See row information)  Yes - validated most recent copy scanned in chart (See row information)     Current Medications (verified) Outpatient Encounter Medications as of 01/13/2024  Medication Sig   acetaminophen  (TYLENOL ) 650 MG CR tablet Take 650 mg by mouth every 8 (eight) hours as needed for pain.   aspirin  81 MG chewable tablet Chew 1 tablet (81 mg total) by mouth daily.   carvedilol  (COREG ) 3.125 MG tablet TAKE ONE TABLET BY MOUTH TWICE DAILY    Cholecalciferol (VITAMIN D ) 50 MCG (2000 UT) CAPS Take 1 capsule by mouth daily.   diclofenac  Sodium (VOLTAREN ) 1 % GEL Apply 2 g topically 2 (two) times daily as needed (pain).   DULoxetine  HCl 40 MG CPEP Take 1 capsule (40 mg total) by mouth daily.   ENTRESTO  24-26 MG TAKE ONE TABLET by MOUTH twice daily   FARXIGA  10 MG TABS tablet TAKE ONE TABLET BY MOUTH BEFORE BREAKFAST   finasteride  ([name] ) 5 MG tablet Take 1 tablet (5 mg total) by mouth daily.   furosemide  (LASIX ) 40 MG tablet Take 1 tab by mouth once daily as needed for wt > 180   ketoconazole  (NIZORAL ) 2 % cream Apply 1 application topically daily as needed for irritation.   LORazepam  (ATIVAN ) 2 MG tablet TAKE A HALF TO 1 TABLET BY MOUTH THREE TIMES A DAY AS NEEDED FOR ANXIETY   melatonin 3 MG TABS tablet Take 1 tablet by mouth daily.   nitroGLYCERIN  (NITROSTAT ) 0.4 MG SL tablet Place 1 tablet (0.4 mg total) under the tongue every 5 (five) minutes as needed for chest pain.   omeprazole  (PRILOSEC) 20 MG capsule Take 1 capsule (20 mg total) by mouth daily as needed.   rosuvastatin  (CRESTOR ) 20 MG tablet Take 1 tablet (20 mg total) by mouth daily.   sildenafil  (REVATIO ) 20 MG tablet Take 20 mg by mouth as needed.   tiZANidine  (ZANAFLEX ) 4 MG tablet TAKE 1  TABLET BY MOUTH AT BEDTIME   triamcinolone cream (KENALOG) 0.1 % Apply 1 application  topically 2 (two) times daily as needed (psoriasis).   promethazine  (PHENERGAN ) 6.25 MG/5ML solution Take 5 mLs (6.25 mg total) by mouth every 6 (six) hours as needed for up to 6 days. For cough   [DISCONTINUED] tadalafil (CIALIS) 20 MG tablet Take 20 mg by mouth daily as needed.     No facility-administered encounter medications on file as of 01/13/2024.    Allergies (verified) Codeine, Lisinopril , and Nsaids   History: Past Medical History:  Diagnosis Date   Anoxic brain injury (HCC)    Anxiety    Anxiety    Arthritis    Bilateral hearing loss    Cancer (HCC) 2010   basal cell ca, head    Cataract    bilateral   CHF (congestive heart failure) (HCC)    Chronic systolic heart failure (HCC)    Coronary artery disease    Depression    ED (erectile dysfunction)    GERD (gastroesophageal reflux disease)    Heart failure (HCC)    History of heart artery stent    History of ST elevation myocardial infarction (STEMI)    Hyperlipidemia    Hypertension    Kidney stone 07/29/2011   passed    Mood disorder (HCC)    Myocardial infarction (HCC)    Neuromuscular disorder (HCC)    neuropathy legs   Psoriasis    Stage 3b chronic kidney disease (CKD) (HCC)    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 Sydnor [facility]    Past Surgical History:  Procedure Laterality Date   BASAL CELL CARCINOMA EXCISION     removed from scalp   CATARACT EXTRACTION W/PHACO Left 07/13/2023   Procedure: CATARACT EXTRACTION PHACO AND INTRAOCULAR LENS PLACEMENT (IOC) LEFT DIABETIC 3.20 00:29.8;  Surgeon: Rosa College, MD;  Location: [facility];  Service: Ophthalmology;  Laterality: Left;   CATARACT EXTRACTION W/PHACO Right 08/03/2023   Procedure: CATARACT EXTRACTION PHACO AND INTRAOCULAR LENS PLACEMENT (IOC) RIGHT DIABETIC 2.37 00:22.9;  Surgeon: Rosa College, MD;  Location: [facility];  Service: Ophthalmology;  Laterality: Right;   COLONOSCOPY  07/03/2017   per Dr. General Kenner, sessile serrated polyp, repeat in 5 yrs    HERNIA REPAIR     LEFT HEART CATH AND CORONARY ANGIOGRAPHY N/A 01/03/2021   Procedure: LEFT HEART CATH AND CORONARY ANGIOGRAPHY;  Surgeon: Millicent Ally, MD;  Location: [facility];  Service: Cardiovascular;  Laterality: N/A;   RIGHT HEART CATH N/A 01/03/2021   Procedure: RIGHT HEART CATH;  Surgeon: Mardell Shade, MD;  Location: [facility];  Service: Cardiovascular;  Laterality: N/A;   Family History  Problem Relation Age of Onset   Hypertension Mother     Hyperlipidemia Mother    Depression Other    Diabetes Other    Hyperlipidemia Other    Hypertension Other    Alzheimer's disease Other    Colon cancer Neg Hx    Stomach cancer Neg Hx    Rectal cancer Neg Hx    Esophageal cancer Neg Hx    Social History   Socioeconomic History   Marital status: Single    Spouse name: Not on file   Number of children: 0   Years of education: Not on file   Highest education level: Master's degree (e.g.,  MA, MS, MEng, MEd, MSW, MBA)  Occupational History   Occupation: retired    Comment: Runner, broadcasting/film/video   Occupation: Careers adviser - [employer]  Tobacco Use   Smoking status: Never    Passive exposure: Never   Smokeless tobacco: Never  Vaping Use   Vaping status: Never Used  Substance and Sexual Activity   Alcohol use: Not Currently   Drug use: No   Sexual activity: Not on file  Other Topics Concern   Not on file  Social History Narrative   Lives alone--[place]      Has living will   Health care POA-- friend Dee Farber   Would accept resuscitation--but no prolonged machines or tube feedings   Social Drivers of Corporate investment banker Strain: Low Risk  (01/13/2024)   Overall Financial Resource Strain (CARDIA)    Difficulty of Paying Living Expenses: Not hard at all  Food Insecurity: No Food Insecurity (01/13/2024)   Hunger Vital Sign    Worried About Running Out of Food in the Last Year: Never true    Ran Out of Food in the Last Year: Never true  Transportation Needs: No Transportation Needs (01/13/2024)   PRAPARE - Administrator, Civil Service (Medical): No    Lack of Transportation (Non-Medical): No  Physical Activity: Sufficiently Active (01/13/2024)   Exercise Vital Sign    Days of Exercise per Week: 7 days    Minutes of Exercise per Session: 120 min  Stress: No Stress Concern Present (01/13/2024)   [employer] of Occupational Health - Occupational Stress Questionnaire    Feeling of Stress : Not at all  Social  Connections: Moderately Integrated (01/13/2024)   Social Connection and Isolation Panel [NHANES]    Frequency of Communication with Friends and Family: More than three times a week    Frequency of Social Gatherings with Friends and Family: More than three times a week    Attends Religious Services: More than 4 times per year    Active Member of [employer] or Organizations: Yes    Attends Banker Meetings: Never    Marital Status: Never married    Tobacco Counseling Counseling given: No    Clinical Intake:  Pre-visit preparation completed: Yes  Pain : No/denies pain     BMI - recorded: 24.49 Nutritional Risks: None Diabetes: No  Lab Results  Component Value Date   HGBA1C 5.8 12/29/2023   HGBA1C 5.6 06/18/2023   HGBA1C 5.5 02/06/2023     How often do you need to have someone help you when you read instructions, pamphlets, or other written materials from your doctor or pharmacy?: 1 - Never  Interpreter Needed?: No  Information entered by :: Kandy Orris, CMA   Activities of Daily Living     01/13/2024    1:59 PM 08/03/2023    6:28 AM  In your present state of health, do you have any difficulty performing the following activities:  Hearing? 0 0  Vision? 0 0  Difficulty concentrating or making decisions? 0 0  Walking or climbing stairs? 0   Dressing or bathing? 0   Doing errands, shopping? 0   Preparing Food and eating ? N   Using the Toilet? N   In the past six months, have you accidently leaked urine? N   Do you have problems with loss of bowel control? N   Managing your Medications? N   Managing your Finances? N   Housekeeping or managing your Housekeeping? N  Patient Care Team: Roslyn Coombe, MD as PCP - General (Internal Medicine) Tat, Von Grumbling, DO as Consulting Physician (Neurology) Carnell Christian, [place] (Pharmacist) Rosa College, MD as Consulting Physician (Ophthalmology) Evelina Hippo, MD as Consulting Physician  (Otolaryngology)  Indicate any recent Medical Services you may have received from other than [facility] providers in the past year (date may be approximate).     Assessment:   This is a routine wellness examination for [name].  Hearing/Vision screen Hearing Screening - Comments:: Denies hearing difficulties   Vision Screening - Comments:: Wears rx glasses - up to date with routine eye exams with Dr Dusty Gin   Goals Addressed               This Visit's Progress     Patient Stated (pt-stated)        Patient stated he will continue eating healthy following a low-fat diet and staying active and socializing.       Depression Screen     01/13/2024    2:07 PM 01/04/2024    1:06 PM 08/28/2023    4:22 PM 07/09/2023    2:25 PM 02/06/2023   12:33 PM 12/15/2022   10:54 AM 08/18/2022    1:49 PM  PHQ 2/9 Scores  PHQ - 2 Score 0 0 0 0 0 0 0  PHQ- 9 Score 1  1 3  0  2    Fall Risk     01/13/2024    2:00 PM 01/04/2024    1:12 PM 08/28/2023    4:22 PM 07/09/2023    2:24 PM 02/06/2023   12:32 PM  Fall Risk   Falls in the past year? 0 0 1 1 1   Number falls in past yr: 0 0 0 0 0  Injury with Fall? 0 0 0 0 1  Risk for fall due to : No Fall Risks No Fall Risks No Fall Risks History of fall(s) History of fall(s)  Follow up Falls prevention discussed;Falls evaluation completed Falls evaluation completed Falls evaluation completed Falls evaluation completed Falls evaluation completed    MEDICARE RISK AT HOME:  Medicare Risk at Home Any stairs in or around the home?: Yes If so, are there any without handrails?: No Home free of loose throw rugs in walkways, pet beds, electrical cords, etc?: Yes Adequate lighting in your home to reduce risk of falls?: Yes Life alert?: No Use of a cane, walker or w/c?: No Grab bars in the bathroom?: No Shower chair or bench in shower?: No Elevated toilet seat or a handicapped toilet?: No  TIMED UP AND GO:  Was the test performed?  No  Cognitive  Function: 6CIT completed    05/03/2018    1:55 PM 04/29/2017    9:12 PM 03/07/2016    5:14 PM  MMSE - Mini Mental State Exam  Not completed: -- --   Orientation to time   5  Orientation to Place   5  Registration   3  Attention/ Calculation   5  Recall   3  Language- name 2 objects   2  Language- repeat   1  Language- follow 3 step command   3  Language- read & follow direction   1  Write a sentence   1  Copy design   1  Total score   30        01/13/2024    2:10 PM 12/15/2022   10:57 AM 12/09/2021   11:31 AM  6CIT  Screen  What Year? 0 points 0 points 0 points  What month? 0 points 0 points 0 points  What time? 0 points 0 points 0 points  Count back from 20 0 points 0 points 0 points  Months in reverse 0 points 0 points 0 points  Repeat phrase 0 points 0 points 4 points  Total Score 0 points 0 points 4 points    Immunizations Immunization History  Administered Date(s) Administered   Fluad Quad(high Dose 65+) 06/15/2019, 05/30/2021, 06/11/2022   Influenza Split 08/13/2011, 06/08/2012, 06/22/2013   Influenza Whole 06/22/2005, 06/19/2010   Influenza, High Dose Seasonal PF 07/05/2017, 05/28/2020   Influenza-Unspecified 07/14/2015, 07/12/2016, 07/05/2017, 06/04/2023   PFIZER Comirnaty(Gray Top)Covid-19 Tri-Sucrose Vaccine 05/10/2021   PFIZER(Purple Top)SARS-COV-2 Vaccination 10/31/2019, 11/26/2019, 07/07/2020, 06/21/2022, 06/11/2023   Pfizer Covid-19 Vaccine Bivalent Booster 23yrs & up 08/08/2021   Pneumococcal Conjugate-13 08/11/2013   Pneumococcal Polysaccharide-23 04/23/2016   Respiratory Syncytial Virus Vaccine,Recomb Aduvanted(Arexvy) 07/24/2022   Td 06/19/2010   Tdap 09/12/2013   Zoster Recombinant(Shingrix ) 12/20/2021, 04/11/2022   Zoster, Live 08/11/2013    Screening Tests Health Maintenance  Topic Date Due   DTaP/Tdap/Td (3 - Td or Tdap) 09/13/2023   INFLUENZA VACCINE  04/22/2024   HEMOGLOBIN A1C  06/29/2024   OPHTHALMOLOGY EXAM  07/19/2024   Diabetic  kidney evaluation - eGFR measurement  12/28/2024   Diabetic kidney evaluation - Urine ACR  12/28/2024   FOOT EXAM  01/03/2025   Medicare Annual Wellness (AWV)  01/12/2025   Colonoscopy  07/22/2029   Pneumonia Vaccine 33+ Years old  Completed   Hepatitis C Screening  Completed   Zoster Vaccines- Shingrix   Completed   HPV VACCINES  Aged Out   Meningococcal B Vaccine  Aged Out   COVID-19 Vaccine  Discontinued    Health Maintenance  Health Maintenance Due  Topic Date Due   DTaP/Tdap/Td (3 - Td or Tdap) 09/13/2023   Health Maintenance Items Addressed:01/13/2024   Additional Screening:  Vision Screening: Recommended annual ophthalmology exams for early detection of glaucoma and other disorders of the eye.  Dental Screening: Recommended annual dental exams for proper oral hygiene  Community Resource Referral / Chronic Care Management: CRR required this visit?  No   CCM required this visit?  No     Plan:     I have personally reviewed and noted the following in the patient's chart:   Medical and social history Use of alcohol, tobacco or illicit drugs  Current medications and supplements including opioid prescriptions. Patient is not currently taking opioid prescriptions. Functional ability and status Nutritional status Physical activity Advanced directives List of other physicians Hospitalizations, surgeries, and ER visits in previous 12 months Vitals Screenings to include cognitive, depression, and falls Referrals and appointments  In addition, I have reviewed and discussed with patient certain preventive protocols, quality metrics, and best practice recommendations. A written personalized care plan for preventive services as well as general preventive health recommendations were provided to patient.     Patria Bookbinder, CMA   01/13/2024   After Visit Summary: (In Person-Declined) Patient declined AVS at this time.  Notes: Nothing significant to report at this  time.

## 2024-01-13 NOTE — Patient Instructions (Addendum)
 [name] , Thank you for taking time to come for your Medicare Wellness Visit. I appreciate your ongoing commitment to your health goals. Please review the following plan we discussed and let me know if I can assist you in the future.  Referrals/Orders/Follow-Ups/Clinician Recommendations: Aim for 30 minutes of exercise or brisk walking, 6-8 glasses of water , and 5 servings of fruits and vegetables each day.   This is a list of the screening recommended for you and due dates:  Health Maintenance  Topic Date Due   DTaP/Tdap/Td vaccine (3 - Td or Tdap) 09/13/2023   Flu Shot  04/22/2024   Hemoglobin A1C  06/29/2024   Eye exam for diabetics  07/19/2024   Yearly kidney function blood test for diabetes  12/28/2024   Yearly kidney health urinalysis for diabetes  12/28/2024   Complete foot exam   01/03/2025   Medicare Annual Wellness Visit  01/12/2025   Colon Cancer Screening  07/22/2029   Pneumonia Vaccine  Completed   Hepatitis C Screening  Completed   Zoster (Shingles) Vaccine  Completed   HPV Vaccine  Aged Out   Meningitis B Vaccine  Aged Out   COVID-19 Vaccine  Discontinued    Advanced directives: (In Chart) A copy of your advanced directives are scanned into your chart should your provider ever need it.  Next Medicare Annual Wellness Visit scheduled for next year: Yes

## 2024-01-20 ENCOUNTER — Ambulatory Visit: Admitting: Licensed Clinical Social Worker

## 2024-02-03 ENCOUNTER — Ambulatory Visit: Admitting: Licensed Clinical Social Worker

## 2024-02-09 ENCOUNTER — Ambulatory Visit (INDEPENDENT_AMBULATORY_CARE_PROVIDER_SITE_OTHER): Admitting: Licensed Clinical Social Worker

## 2024-02-09 DIAGNOSIS — F411 Generalized anxiety disorder: Secondary | ICD-10-CM | POA: Diagnosis not present

## 2024-02-09 NOTE — Progress Notes (Signed)
 [facility] Counselor/Therapist Progress Note  Patient ID: [name], MRN: [id number]    Date: 02/09/24  Time Spent: 0302  pm - 0415 pm : 73 Minutes  Treatment Type: Individual Therapy.  Patient stated, "I was seeing another therapist but she states she couldn't see me for the childhood trauma.  Patient stated, "I switched last year early in the year I switched my PCP". Patient reported patient's PCP at the time prescribed duloxetine  and ativan . Patient reported he saw Dr. Letvak for a duration of time and is now seeing Dr. Rosalia [name]. Patient stated, "I seen things between two adults that a child should not see." and reported Dr. Joelle [name] referred patient for therapy with [name].  Reported Symptoms: ruminating thoughts, muscle tension, feeling flushed when feeling anxious   Mental Status Exam: Appearance:  Neat and Well Groomed     Behavior: Appropriate  Motor: Normal  Speech/Language:  Clear and Coherent and Normal Rate  Affect: Appropriate  Mood: normal  Thought process: normal  Thought content:   WNL  Sensory/Perceptual disturbances:   WNL  Orientation: oriented to person, place, and situation  Attention: Good  Concentration: Good  Memory: WNL  Fund of knowledge:  Good  Insight:   Good  Judgment:  Good  Impulse Control: Good    Risk Assessment: Danger to Self:  No Patient denied current suicidal ideation  Self-injurious Behavior: No Danger to Others: No Patient denied current homicidal ideation Duty to Warn:no Physical Aggression /Violence:No  Access to Firearms a concern: No  Gang Involvement:No    [name] presented for his session reporting that his dog who was his long time companion passed away suddenly 2 weeks ago. Patient stated, "[name] was the first day that I didn't have that heavy feeling in my chest." Patient identified the loneliness and sadness that he felt during the past couple of weeks. Patient reports that some friends gave him a  puppy and he feels that things are working out well so far. Patient reports that he is trying to be busy and engaged as he was before. Patient also shared concerns about medications and how he was treated poorly by a provider concerning a medication he had been prescribed by another provider. Clinician advised patient to direct medication inquiries to his PCP.   Clinician processed patients grief with him and allowed him space to discuss and process his emotions. Clinician advised patient to allow himself time to grieve and to know there are no time limits on grief.   [name] will continue with bi weekly therapy sessions and treatment plan will be reviewed by 12/15/2024.   Interventions: Motivational Interviewing and psycho education. Clinician conducted session in person at clinician's office at [facility]. Reviewed events since last session. Assessed patient's mood since last session and current mood. Clinician reviewed diagnoses and treatment recommendations. Provided psycho education related to diagnoses and treatment.     Diagnosis: Generalized Anxiety Disorder   [name] MSW, LCSW/DATE 02/09/2024

## 2024-02-11 ENCOUNTER — Other Ambulatory Visit: Payer: Self-pay | Admitting: Cardiovascular Disease

## 2024-02-22 ENCOUNTER — Telehealth: Payer: Self-pay | Admitting: Cardiovascular Disease

## 2024-02-22 DIAGNOSIS — E1122 Type 2 diabetes mellitus with diabetic chronic kidney disease: Secondary | ICD-10-CM | POA: Diagnosis not present

## 2024-02-22 DIAGNOSIS — N1831 Chronic kidney disease, stage 3a: Secondary | ICD-10-CM | POA: Diagnosis not present

## 2024-02-22 DIAGNOSIS — D631 Anemia in chronic kidney disease: Secondary | ICD-10-CM | POA: Diagnosis not present

## 2024-02-22 DIAGNOSIS — I129 Hypertensive chronic kidney disease with stage 1 through stage 4 chronic kidney disease, or unspecified chronic kidney disease: Secondary | ICD-10-CM | POA: Diagnosis not present

## 2024-02-22 DIAGNOSIS — N2581 Secondary hyperparathyroidism of renal origin: Secondary | ICD-10-CM | POA: Diagnosis not present

## 2024-02-22 MED ORDER — DAPAGLIFLOZIN PROPANEDIOL 10 MG PO TABS: 10.0000 mg | ORAL_TABLET | Freq: Every day | ORAL | 0 refills | Status: DC

## 2024-02-22 NOTE — Telephone Encounter (Signed)
 Pt's medication was sent to pt's pharmacy as requested. Confirmation received.

## 2024-02-22 NOTE — Telephone Encounter (Signed)
*  STAT* If patient is at the pharmacy, call can be transferred to refill team.   1. Which medications need to be refilled? (please list name of each medication and dose if known) dapagliflozin  propanediol (FARXIGA ) 10 MG TABS tablet   2. Which pharmacy/location (including street and city if local pharmacy) is medication to be sent to? [facility] [id number] - [place], [place] - [address]    3. Do they need a 30 day or 90 day supply? 90

## 2024-02-29 ENCOUNTER — Ambulatory Visit (INDEPENDENT_AMBULATORY_CARE_PROVIDER_SITE_OTHER): Admitting: Licensed Clinical Social Worker

## 2024-02-29 DIAGNOSIS — F411 Generalized anxiety disorder: Secondary | ICD-10-CM

## 2024-02-29 NOTE — Progress Notes (Signed)
 [facility] Counselor/Therapist Progress Note  Patient ID: [name], MRN: [id number]    Date: 02/29/24  Time Spent: 1105  am - 1200 pm : 55 Minutes  Treatment Type: Individual Therapy.  Reported Symptoms: Patient stated, "I was seeing another therapist but she states she couldn't see me for the childhood trauma. Patient stated, "I switched last year early in the year I switched my PCP". Patient reported patient's PCP at the time prescribed duloxetine  and ativan . Patient reported he saw Dr. Letvak for a duration of time and is now seeing Dr. Rosalia Colonel. Patient stated, "I seen things between two adults that a child should not see." and reported Dr. Joelle Musca referred patient for therapy with Burlene Carpen.   Reported Symptoms: ruminating thoughts, muscle tension, feeling flushed when feeling anxious   Mental Status Exam: Appearance:  Neat and Well Groomed     Behavior: Appropriate  Motor: Normal  Speech/Language:  Clear and Coherent and Normal Rate  Affect: Appropriate  Mood: normal  Thought process: normal  Thought content:   WNL  Sensory/Perceptual disturbances:   WNL  Orientation: oriented to person, place, and situation  Attention: Good  Concentration: Good  Memory: WNL  Fund of knowledge:  Good  Insight:   Good  Judgment:  Good  Impulse Control: Good    Risk Assessment: Danger to Self:  No Patient denied current suicidal ideation  Self-injurious Behavior: No Danger to Others: No Patient denied current homicidal ideation Duty to Warn:no Physical Aggression /Violence:No  Access to Firearms a concern: No  Gang Involvement:No    [name] presented for his session in person at the [employer]. Clinician presented from her office located at the [facility]. [name] consented to treatment.   [name] presented for his session reporting that he is doing much better. He states that his dog has really helped him to cope with the loss of  Toby. [name] reports that he has got back into playing cards and going out more. [name] states that for a few weeks he was really struggling. [name] also discussed some struggles from childhood and how that has made him distrusting of others and their agenda. [name] states that he isn't open with others and therefore feels that negatively impacts his relationships.  Clinician actively listened and validated and supported patients feelings via verbal interaction and active listening. Clinician processed the validity of patients feelings and how he can work on having a balance when interacting in social situations.   Patient was fully engaged in discussion and is motivated for treatment. Patient continues to utilize coping skills to assist in reaching treatment goals. Patient will continue with by weekly CBT therapy sessions. Treatment plan to be reviewed by 12/15/2024.   Interventions: Cognitive Behavioral Therapy, Motivational Interviewing and psycho education. Clinician conducted session in person at clinician's office at [facility]. Reviewed events since last session. Assessed patient's mood since last session and current mood. Clinician reviewed diagnoses and treatment recommendations. Provided psycho education related to diagnoses and treatment.     Diagnosis: Generalized Anxiety Disorder   Keenan Pastor MSW, LCSW/DATE 02/29/2024

## 2024-03-03 ENCOUNTER — Other Ambulatory Visit: Payer: Self-pay | Admitting: Internal Medicine

## 2024-03-14 ENCOUNTER — Other Ambulatory Visit: Payer: Self-pay | Admitting: Cardiovascular Disease

## 2024-03-14 ENCOUNTER — Ambulatory Visit (INDEPENDENT_AMBULATORY_CARE_PROVIDER_SITE_OTHER): Admitting: Licensed Clinical Social Worker

## 2024-03-14 DIAGNOSIS — F411 Generalized anxiety disorder: Secondary | ICD-10-CM | POA: Diagnosis not present

## 2024-03-16 NOTE — Progress Notes (Signed)
 [facility] Counselor/Therapist Progress Note  Patient ID: [name], MRN: [id number]    Date: 03/14/2024  Time Spent: 100  pm - 158 pm : 58 Minutes  Treatment Type: Individual Therapy.  Reported Symptoms: ruminating thoughts, muscle tension, feeling flushed when feeling anxious   Mental Status Exam: Appearance:  Neat and Well Groomed     Behavior: Appropriate  Motor: Normal  Speech/Language:  Clear and Coherent and Normal Rate  Affect: Appropriate  Mood: normal  Thought process: normal  Thought content:   WNL  Sensory/Perceptual disturbances:   WNL  Orientation: oriented to person, place, and situation  Attention: Good  Concentration: Good  Memory: WNL  Fund of knowledge:  Good  Insight:   Good  Judgment:  Good  Impulse Control: Good    Risk Assessment: Danger to Self:  No Patient denied current suicidal ideation  Self-injurious Behavior: No Danger to Others: No Patient denied current homicidal ideation Duty to Warn:no Physical Aggression /Violence:No  Access to Firearms a concern: No  Gang Involvement:No    [name] presented for his session in person at the [employer]. Clinician presented from her office located at the [facility]. [name] consented to treatment.   [name] presented for his session in a positive mood. [name] reports that he has been doing well overall but has been consumed by the fear of taking too much medication. He reports that he would take less than prescribed and would not feel complete relief. He reports that he finally took what was prescribed. He reports that he finally felt that the medication was working. Patient reports that he continues to interact with others socially and help his sister. He reports that getting his new puppy was a blessing as it has helped him through the grieving process. Patient reports that he does feel the medication has improved his anxiety symptoms.  Clinician provided  support and feedback via active listening and verbal feedback. Clinician processed with patient that taking medication as prescribed is not an issue. Clinician encouraged patient to be mindful of how the medication works and report any changes to his provider. Clinician also processed with patient the positive aspects of being social in his social group and remaining active.  [name] was fully engaged in session and he is open to feedback. Patient will continue to utilize coping skills and take medication as prescribed by his provider. [name] will continue to engage in bi weekly therapy sessions. Treatment planning will be reviewed by 12/15/2024.   Interventions: Cognitive Behavioral Therapy, Motivational Interviewing and psycho education. Clinician conducted session in person at clinician's office at [facility]. Reviewed events since last session. Assessed patient's mood since last session and current mood. Clinician reviewed diagnoses and treatment recommendations. Provided psycho education related to diagnoses and treatment.     Diagnosis: Generalized Anxiety Disorder   Damien Junk MSW, LCSW/DATE 03/14/2024

## 2024-03-27 NOTE — Progress Notes (Unsigned)
 Cardiology Office Note:    Date:  03/30/2024   ID:  [name], DOB 1951/06/21, MRN [id number]  PCP:  Norleen Lynwood ORN, MD   [facility] Providers Cardiologist:  Darryle ONEIDA Decent, MD Cardiology APP:  Madie Jon Garre, [place]     Referring MD: Norleen Lynwood ORN, MD   Chief Complaint  Patient presents with   Follow-up    CAD    History of Present Illness:    [name] is a 73 y.o. male with a hx of CAD s/p inferior STEMI and V-fib arrest in 12/2020 treated with PCI to the RCA with residual 50% proximal LAD stenosis, negative PET stress 01/28/2023 performed for dyspnea, chronic systolic heart failure felt ischemic cardiomyopathy with an LVEF 25-30% 12/2020 that improved to 55-60% on echocardiogram 06/2022.  History also includes CKD 3B and hyperlipidemia.  He presents for annual cardiology follow-up.  He is largely intolerant to the heat, this is normal for him. No cardiac symptoms. Answer questions regarding heart cath and remaining stenosis.     Past Medical History:  Diagnosis Date   Anoxic brain injury (HCC)    Anxiety    Anxiety    Arthritis    Bilateral hearing loss    Cancer (HCC) 2010   basal cell ca, head   Cataract    bilateral   CHF (congestive heart failure) (HCC)    Chronic systolic heart failure (HCC)    Coronary artery disease    Depression    ED (erectile dysfunction)    GERD (gastroesophageal reflux disease)    Heart failure (HCC)    History of heart artery stent    History of ST elevation myocardial infarction (STEMI)    Hyperlipidemia    Hypertension    Kidney stone 07/29/2011   passed    Mood disorder (HCC)    Myocardial infarction (HCC)    Neuromuscular disorder (HCC)    neuropathy legs   Psoriasis    Stage 3b chronic kidney disease (CKD) (HCC)    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 Sydnor at [facility]     Past Surgical History:  Procedure  Laterality Date   BASAL CELL CARCINOMA EXCISION     removed from scalp   CATARACT EXTRACTION W/PHACO Left 07/13/2023   Procedure: CATARACT EXTRACTION PHACO AND INTRAOCULAR LENS PLACEMENT (IOC) LEFT DIABETIC 3.20 00:29.8;  Surgeon: Myrna Adine Anes, MD;  Location: [facility];  Service: Ophthalmology;  Laterality: Left;   CATARACT EXTRACTION W/PHACO Right 08/03/2023   Procedure: CATARACT EXTRACTION PHACO AND INTRAOCULAR LENS PLACEMENT (IOC) RIGHT DIABETIC 2.37 00:22.9;  Surgeon: Myrna Adine Anes, MD;  Location: [facility];  Service: Ophthalmology;  Laterality: Right;   COLONOSCOPY  07/03/2017   per Dr. Leigh, sessile serrated polyp, repeat in 5 yrs    HERNIA REPAIR     LEFT HEART CATH AND CORONARY ANGIOGRAPHY N/A 01/03/2021   Procedure: LEFT HEART CATH AND CORONARY ANGIOGRAPHY;  Surgeon: Burnard Debby LABOR, MD;  Location: [facility];  Service: Cardiovascular;  Laterality: N/A;   RIGHT HEART CATH N/A 01/03/2021   Procedure: RIGHT HEART CATH;  Surgeon: Cherrie Toribio SAUNDERS, MD;  Location: [facility];  Service: Cardiovascular;  Laterality: N/A;    Current Medications: Current Meds  Medication Sig   acetaminophen  (TYLENOL ) 650 MG CR tablet Take 650 mg by mouth every 8 (eight) hours as needed  for pain.   aspirin  81 MG chewable tablet Chew 1 tablet (81 mg total) by mouth daily.   carvedilol  (COREG ) 3.125 MG tablet TAKE ONE TABLET BY MOUTH TWICE DAILY   Cholecalciferol (VITAMIN D ) 50 MCG (2000 UT) CAPS Take 1 capsule by mouth daily. (Patient taking differently: Take 2 capsules by mouth daily.)   dapagliflozin  propanediol (FARXIGA ) 10 MG TABS tablet TAKE 1 TABLET BY MOUTH DAILY BEFORE BREAKFAST   diclofenac  Sodium (VOLTAREN ) 1 % GEL Apply 2 g topically 2 (two) times daily as needed (pain).   DULoxetine  HCl 40 MG CPEP Take 1 capsule (40 mg total) by mouth daily.   ENTRESTO  24-26 MG TAKE ONE TABLET by MOUTH twice daily   finasteride  ([name] ) 5 MG tablet Take 1  tablet (5 mg total) by mouth daily.   furosemide  (LASIX ) 40 MG tablet Take 1 tab by mouth once daily as needed for wt > 180   ketoconazole  (NIZORAL ) 2 % cream Apply 1 application topically daily as needed for irritation.   LORazepam  (ATIVAN ) 2 MG tablet TAKE 1/2 TO 1 TABLET BY MOUTH 3 TIMES A DAY AS NEEDED FOR ANXIETY   melatonin 3 MG TABS tablet Take 1 tablet by mouth daily.   nitroGLYCERIN  (NITROSTAT ) 0.4 MG SL tablet Place 1 tablet (0.4 mg total) under the tongue every 5 (five) minutes as needed for chest pain.   omeprazole  (PRILOSEC) 20 MG capsule Take 1 capsule (20 mg total) by mouth daily as needed.   rosuvastatin  (CRESTOR ) 20 MG tablet Take 1 tablet (20 mg total) by mouth daily.   sildenafil  (REVATIO ) 20 MG tablet Take 20 mg by mouth as needed.   tiZANidine  (ZANAFLEX ) 4 MG tablet TAKE 1 TABLET BY MOUTH AT BEDTIME (Patient taking differently: Take 2 mg by mouth at bedtime.)   triamcinolone cream (KENALOG) 0.1 % Apply 1 application  topically 2 (two) times daily as needed (psoriasis).     Allergies:   Codeine, Lisinopril , and Nsaids   Social History   Socioeconomic History   Marital status: Single    Spouse name: Not on file   Number of children: 0   Years of education: Not on file   Highest education level: Master's degree (e.g., MA, MS, MEng, MEd, MSW, MBA)  Occupational History   Occupation: retired    Comment: Runner, broadcasting/film/video   Occupation: Careers adviser - [employer]  Tobacco Use   Smoking status: Never    Passive exposure: Never   Smokeless tobacco: Never  Vaping Use   Vaping status: Never Used  Substance and Sexual Activity   Alcohol use: Not Currently   Drug use: No   Sexual activity: Not on file  Other Topics Concern   Not on file  Social History Narrative   Lives alone--[place]      Has living will   Health care POA-- friend Chyrl   Would accept resuscitation--but no prolonged machines or tube feedings   Social Drivers of Corporate investment banker Strain: Low  Risk  (01/13/2024)   Overall Financial Resource Strain (CARDIA)    Difficulty of Paying Living Expenses: Not hard at all  Food Insecurity: No Food Insecurity (01/13/2024)   Hunger Vital Sign    Worried About Running Out of Food in the Last Year: Never true    Ran Out of Food in the Last Year: Never true  Transportation Needs: No Transportation Needs (01/13/2024)   PRAPARE - Transportation    Lack of Transportation (Medical): No    Lack  of Transportation (Non-Medical): No  Physical Activity: Sufficiently Active (01/13/2024)   Exercise Vital Sign    Days of Exercise per Week: 7 days    Minutes of Exercise per Session: 120 min  Stress: No Stress Concern Present (01/13/2024)   [employer] of Occupational Health - Occupational Stress Questionnaire    Feeling of Stress : Not at all  Social Connections: Moderately Integrated (01/13/2024)   Social Connection and Isolation Panel    Frequency of Communication with Friends and Family: More than three times a week    Frequency of Social Gatherings with Friends and Family: More than three times a week    Attends Religious Services: More than 4 times per year    Active Member of [employer] or Organizations: Yes    Attends Banker Meetings: Never    Marital Status: Never married     Family History: The patient's family history includes Alzheimer's disease in an other family member; Depression in an other family member; Diabetes in an other family member; Hyperlipidemia in his mother and another family member; Hypertension in his mother and another family member. There is no history of Colon cancer, Stomach cancer, Rectal cancer, or Esophageal cancer.  ROS:   Please see the history of present illness.     All other systems reviewed and are negative.  EKGs/Labs/Other Studies Reviewed:    The following studies were reviewed today:  EKG Interpretation Date/Time:  Wednesday March 30 2024 11:29:00 EDT Ventricular Rate:  66 PR  Interval:  200 QRS Duration:  76 QT Interval:  398 QTC Calculation: 417 R Axis:   -9  Text Interpretation: Normal sinus rhythm Normal ECG When compared with ECG of 07-Jul-2022 13:58, No significant change was found Confirmed by Madie Slough ([id number]) on 03/30/2024 11:36:10 AM    Recent Labs: 12/29/2023: ALT 19; BUN 29; Creatinine, Ser 1.30; Hemoglobin 15.6; Platelets 155.0; Potassium 3.6; Sodium 139; TSH 1.62  Recent Lipid Panel    Component Value Date/Time   CHOL 156 12/29/2023 0814   CHOL 139 11/16/2019 0859   TRIG 90.0 12/29/2023 0814   HDL 52.50 12/29/2023 0814   HDL 60 11/16/2019 0859   CHOLHDL 3 12/29/2023 0814   VLDL 18.0 12/29/2023 0814   LDLCALC 85 12/29/2023 0814   LDLCALC 71 05/24/2020 0829   LDLDIRECT 141.9 04/06/2013 0843     Risk Assessment/Calculations:                Physical Exam:    VS:  BP 132/80 (BP Location: Left Arm, Patient Position: Sitting, Cuff Size: Normal)   Pulse 66   Ht 6' (1.829 m)   Wt 178 lb 9.6 oz (81 kg)   SpO2 99%   BMI 24.22 kg/m     Wt Readings from Last 3 Encounters:  03/30/24 178 lb 9.6 oz (81 kg)  01/13/24 180 lb 9.6 oz (81.9 kg)  01/04/24 182 lb (82.6 kg)     GEN:  Well nourished, well developed in no acute distress HEENT: Normal NECK: No JVD; No carotid bruits LYMPHATICS: No lymphadenopathy CARDIAC: RRR, no murmurs, rubs, gallops RESPIRATORY:  Clear to auscultation without rales, wheezing or rhonchi  ABDOMEN: Soft, non-tender, non-distended MUSCULOSKELETAL:  No edema; No deformity  SKIN: Warm and dry NEUROLOGIC:  Alert and oriented x 3 PSYCHIATRIC:  Normal affect   ASSESSMENT:    1. Coronary artery disease of native artery of native heart with stable angina pectoris (HCC)   2. CAD S/P percutaneous coronary angioplasty   3.  Primary hypertension   4. Hyperlipidemia with target LDL less than 70   5. Chronic systolic heart failure (HCC)    PLAN:    In order of problems listed above:  CAD History of inferior  STEMI complicated by V-fib arrest - DES-RCA - Residual proximal LAD disease - Reassuring PET stress test in 2024 -Remains on aspirin  monotherapy - switch to EC from chewable   Chronic systolic heart failure with improved LVEF Hypertension - Felt ischemic in nature - Maintained on 3.125 mg Coreg  twice daily, 24-26 mg Entresto  twice daily, 10 mg Farxiga , 40 mg Lasix  as needed -- taking lasix  very seldom   Hyperlipidemia with LDL goal less than 70 - On 20 mg Crestor  12/29/2023: Cholesterol 156; HDL 52.50; LDL Cholesterol 85; Triglycerides 90.0; VLDL 18.0 -Not at goal, after these labs, PCP increased crestor  to 20 mg -- he had a tough time adjusting to 20 mg crestor , previously did not tolerate 40 mg (in 2019)   Follow up in 1 year with me or Dr. Barbaraann.     Medication Adjustments/Labs and Tests Ordered: Current medicines are reviewed at length with the patient today.  Concerns regarding medicines are outlined above.  Orders Placed This Encounter  Procedures   EKG 12-Lead   No orders of the defined types were placed in this encounter.   There are no Patient Instructions on file for this visit.   Signed, Jon Nat Hails, PA  03/30/2024 11:53 AM    [facility]

## 2024-03-28 ENCOUNTER — Ambulatory Visit (INDEPENDENT_AMBULATORY_CARE_PROVIDER_SITE_OTHER): Admitting: Licensed Clinical Social Worker

## 2024-03-28 DIAGNOSIS — F411 Generalized anxiety disorder: Secondary | ICD-10-CM

## 2024-03-28 NOTE — Progress Notes (Signed)
  Behavioral Health Counselor/Therapist Progress Note  Patient ID: [name], MRN: [id number]    Date: 03/28/24  Time Spent: 102  pm - 0200 pm : 58 Minutes  Treatment Type: Individual Therapy.  Reported Symptoms: ruminating thoughts, muscle tension, feeling flushed when feeling anxious   Mental Status Exam: Appearance:  Neat and Well Groomed     Behavior: Appropriate  Motor: Normal  Speech/Language:  Clear and Coherent and Normal Rate  Affect: Appropriate  Mood: normal  Thought process: normal  Thought content:   WNL  Sensory/Perceptual disturbances:   WNL  Orientation: oriented to person, place, and situation  Attention: Good  Concentration: Good  Memory: WNL  Fund of knowledge:  Good  Insight:   Good  Judgment:  Good  Impulse Control: Good    Risk Assessment: Danger to Self:  No Patient denied current suicidal ideation  Self-injurious Behavior: No Danger to Others: No Patient denied current homicidal ideation Duty to Warn:no Physical Aggression /Violence:No  Access to Firearms a concern: No  Gang Involvement:No    [name] presented for his session in person at the [employer]. Clinician presented from her office located at the [facility]. [name] consented to treatment.   [name] presented for his session in a positive mood. [name] shared that he has been trying to think of specific triggers to his anxiety and frustration. [name] delved into his childhood experiences with father and mother. Patient reports that he is much like his mother and holds things inside. Patient reports that once he has enough he blows up and later regrets his response.   Clinician provided support and encouragement via active listening and verbal feedback. Clinician and patient processed his holding in his emotions and how that can be detrimental to relationships. We processed that setting clear boundaries in relationships is important to maintain healthy  relationships. [name] processed setting boundaries with his sister and needing to work on that with others by voicing his opinions and identifying when he was frustrated.  [name] was fully involved in session and engaged in discussion. [name] will continue to utilize coping skills to cope with mental health symptoms. [name] will continue to engage in bi weekly therapy. Treatment planning will be reviewed by 12/15/2024.  Interventions: Cognitive Behavioral Therapy, Motivational Interviewing and psycho education. Clinician conducted session in person at clinician's office at [facility]. Reviewed events since last session. Assessed patient's mood since last session and current mood. Clinician reviewed diagnoses and treatment recommendations. Provided psycho education related to diagnoses and treatment.     Diagnosis: Generalized Anxiety Disorder   Damien Junk MSW, LCSW/DATE 03/28/2024

## 2024-03-30 ENCOUNTER — Encounter: Payer: Self-pay | Admitting: Physician Assistant

## 2024-03-30 ENCOUNTER — Ambulatory Visit: Attending: Cardiovascular Disease | Admitting: Physician Assistant

## 2024-03-30 VITALS — BP 132/80 | HR 66 | Ht 72.0 in | Wt 178.6 lb

## 2024-03-30 DIAGNOSIS — I10 Essential (primary) hypertension: Secondary | ICD-10-CM | POA: Diagnosis not present

## 2024-03-30 DIAGNOSIS — E785 Hyperlipidemia, unspecified: Secondary | ICD-10-CM

## 2024-03-30 DIAGNOSIS — Z9861 Coronary angioplasty status: Secondary | ICD-10-CM | POA: Diagnosis not present

## 2024-03-30 DIAGNOSIS — I2510 Atherosclerotic heart disease of native coronary artery without angina pectoris: Secondary | ICD-10-CM | POA: Diagnosis not present

## 2024-03-30 DIAGNOSIS — I5022 Chronic systolic (congestive) heart failure: Secondary | ICD-10-CM | POA: Diagnosis not present

## 2024-03-30 DIAGNOSIS — I25118 Atherosclerotic heart disease of native coronary artery with other forms of angina pectoris: Secondary | ICD-10-CM | POA: Diagnosis not present

## 2024-03-30 MED ORDER — CARVEDILOL 3.125 MG PO TABS: 3.1250 mg | ORAL_TABLET | Freq: Two times a day (BID) | ORAL | 3 refills | Status: AC

## 2024-03-30 MED ORDER — DAPAGLIFLOZIN PROPANEDIOL 10 MG PO TABS: 10.0000 mg | ORAL_TABLET | Freq: Every day | ORAL | 3 refills | Status: AC

## 2024-03-30 MED ORDER — ROSUVASTATIN CALCIUM 20 MG PO TABS: 20.0000 mg | ORAL_TABLET | Freq: Every day | ORAL | 3 refills | Status: AC

## 2024-03-30 MED ORDER — SACUBITRIL-VALSARTAN 24-26 MG PO TABS
1.0000 | ORAL_TABLET | Freq: Two times a day (BID) | ORAL | 11 refills | Status: AC
Start: 2024-03-30 — End: ?

## 2024-03-30 NOTE — Patient Instructions (Addendum)
 Medication Instructions:  Switch chewable Asprin to Asprin EC 81 mg   *If you need a refill on your cardiac medications before your next appointment, please call your pharmacy*  Lab Work: NONE ordered at this time of appointment   Testing/Procedures: NONE ordered at this time of appointment   Follow-Up: At [facility], you and your health needs are our priority.  As part of our continuing mission to provide you with exceptional heart care, our providers are all part of one team.  This team includes your primary Cardiologist (physician) and Advanced Practice Providers or APPs (Physician Assistants and Nurse Practitioners) who all work together to provide you with the care you need, when you need it.  Your next appointment:   1 year(s)  Provider:   Darryle ONEIDA Decent, MD    We recommend signing up for the patient portal called MyChart.  Sign up information is provided on this After Visit Summary.  MyChart is used to connect with patients for Virtual Visits (Telemedicine).  Patients are able to view lab/test results, encounter notes, upcoming appointments, etc.  Non-urgent messages can be sent to your provider as well.   To learn more about what you can do with MyChart, go to [email or web address].

## 2024-04-03 NOTE — Progress Notes (Unsigned)
     Dalton Garfield T. Dalton Hegwood, MD, CAQ Sports Medicine [facility] at [facility] [address] [place], [address]  Phone: [phone]  FAX: [phone]  [name] - 73 y.o. male  MRN [id number]  Date of Birth: 07-18-1951  Date: 04/04/2024  PCP: Norleen Lynwood ORN, MD  Referral: Norleen Lynwood ORN, MD  No chief complaint on file.  Subjective:   [name] is a 73 y.o. very pleasant male patient with There is no height or weight on file to calculate BMI. who presents with the following:  The patient presents with ongoing low back pain.  X-rays from roughly 18 months ago are consistent with moderate to severe degenerative disc disease as well as some arthropathy in the back.  Does a lot of stuff outside in the spring and fall Has some back yard work  Mid June changed some of the rock in the back yard, some heavy lifting Aggravated his back Started a whole zanaflex , now down to 2 mg  Also hurt his L knee but it is better now  Sometimes going from room to room, feels like it might give way  Some pain in the R shoulder blade, too  Walks for exercise, [facility] Training a new puppy  Nape of neck, upper trapezius -r No shoulder pain    Lumbar in the R   Review of Systems is noted in the HPI, as appropriate  Objective:   There were no vitals taken for this visit.  GEN: No acute distress; alert,appropriate. PULM: Breathing comfortably in no respiratory distress PSYCH: Normally interactive.   Laboratory and Imaging Data:  CLINICAL DATA:  Left-sided low back pain   EXAM: LUMBAR SPINE - COMPLETE 4+ VIEW   COMPARISON:  None Available.   FINDINGS: There is levoscoliosis in the lumbar spine. No recent fracture is seen. Alignment of posterior margins of vertebral bodies is unremarkable. Degenerative changes are noted with disc space narrowing, bony spurs and facet hypertrophy throughout the lumbar spine. There  are scattered arterial calcifications. There is evidence of previous hernia repair in the inguinal regions.   IMPRESSION: No recent fracture is seen in lumbar spine. Levoscoliosis. Moderate to severe degenerative changes are noted throughout lumbar spine with disc space narrowing, bony spurs and facet hypertrophy.     Electronically Signed   By: Gearldine Mary M.D.   On: 05/16/2022 20:19    Assessment and Plan:   ***

## 2024-04-04 ENCOUNTER — Ambulatory Visit: Admitting: Family Medicine

## 2024-04-04 ENCOUNTER — Encounter: Payer: Self-pay | Admitting: Family Medicine

## 2024-04-04 ENCOUNTER — Telehealth: Payer: Self-pay | Admitting: Internal Medicine

## 2024-04-04 VITALS — BP 120/70 | HR 72 | Temp 98.3°F | Ht 72.0 in | Wt 182.5 lb

## 2024-04-04 DIAGNOSIS — G8929 Other chronic pain: Secondary | ICD-10-CM | POA: Diagnosis not present

## 2024-04-04 DIAGNOSIS — M25562 Pain in left knee: Secondary | ICD-10-CM

## 2024-04-04 DIAGNOSIS — M5442 Lumbago with sciatica, left side: Secondary | ICD-10-CM

## 2024-04-04 DIAGNOSIS — M25511 Pain in right shoulder: Secondary | ICD-10-CM | POA: Diagnosis not present

## 2024-04-04 DIAGNOSIS — M5137 Other intervertebral disc degeneration, lumbosacral region with discogenic back pain only: Secondary | ICD-10-CM

## 2024-04-04 DIAGNOSIS — M62830 Muscle spasm of back: Secondary | ICD-10-CM

## 2024-04-04 MED ORDER — TIZANIDINE HCL 2 MG PO CAPS: 2.0000 mg | ORAL_CAPSULE | Freq: Three times a day (TID) | ORAL | 2 refills | Status: DC

## 2024-04-04 MED ORDER — PREDNISONE 20 MG PO TABS: ORAL_TABLET | ORAL | 0 refills | Status: DC

## 2024-04-04 NOTE — Telephone Encounter (Signed)
 Copied from CRM [id number]. Topic: Clinical - Medication Prior Auth >> Apr 04, 2024 11:15 AM Suzen RAMAN wrote: Reason for CRM: Prior authorization requested for tizanidine  (ZANAFLEX ) 2 MG capsule.   Methods for submission: [phone]- Clinical Pharmacy for [facility] or CoverMyMeds.  CB#[phone]

## 2024-04-06 ENCOUNTER — Encounter: Payer: Self-pay | Admitting: Family Medicine

## 2024-04-11 ENCOUNTER — Other Ambulatory Visit: Payer: Self-pay | Admitting: Internal Medicine

## 2024-04-11 ENCOUNTER — Ambulatory Visit (INDEPENDENT_AMBULATORY_CARE_PROVIDER_SITE_OTHER): Admitting: Licensed Clinical Social Worker

## 2024-04-11 ENCOUNTER — Encounter: Payer: Self-pay | Admitting: Internal Medicine

## 2024-04-11 DIAGNOSIS — F411 Generalized anxiety disorder: Secondary | ICD-10-CM | POA: Diagnosis not present

## 2024-04-11 MED ORDER — LORAZEPAM 2 MG PO TABS: 2.0000 mg | ORAL_TABLET | Freq: Three times a day (TID) | ORAL | 0 refills | Status: DC | PRN

## 2024-04-11 NOTE — Telephone Encounter (Signed)
 Copied from CRM [id number]. Topic: Clinical - Medication Refill >> Apr 11, 2024  9:51 AM Montie POUR wrote: Medication: LORazepam  (ATIVAN ) 2 MG tablet  Has the patient contacted their pharmacy? Yes (Agent: If no, request that the patient contact the pharmacy for the refill. If patient does not wish to contact the pharmacy document the reason why and proceed with request.) (Agent: If yes, when and what did the pharmacy advise?) Pharmacy needs order to refill  This is the patient's preferred pharmacy:  [facility] [id number] [place], [place] - [address] [place] [address] Phone: [phone] Fax: [phone]  Is this the correct pharmacy for this prescription? Yes If no, delete pharmacy and type the correct one.   Has the prescription been filled recently? No  Is the patient out of the medication? Yes He is out of medication today  Has the patient been seen for an appointment in the last year OR does the patient have an upcoming appointment? Yes  Can we respond through MyChart? Yes  Agent: Please be advised that Rx refills may take up to 3 business days. We ask that you follow-up with your pharmacy.

## 2024-04-11 NOTE — Progress Notes (Unsigned)
 [facility] Counselor/Therapist Progress Note  Patient ID: [name], MRN: [id number]    Date: 04/11/24  Time Spent: 104  pm- 0205 pm : 61 Minutes  Treatment Type: Individual Therapy.  Reported Symptoms: ruminating thoughts, muscle tension, feeling flushed when feeling anxious   Mental Status Exam: Appearance:  Neat and Well Groomed     Behavior: Appropriate  Motor: Normal  Speech/Language:  Clear and Coherent and Normal Rate  Affect: Appropriate  Mood: normal  Thought process: normal  Thought content:   WNL  Sensory/Perceptual disturbances:   WNL  Orientation: oriented to person, place, and situation  Attention: Good  Concentration: Good  Memory: WNL  Fund of knowledge:  Good  Insight:   Good  Judgment:  Good  Impulse Control: Good    Risk Assessment: Danger to Self:  No Patient denied current suicidal ideation  Self-injurious Behavior: No Danger to Others: No Patient denied current homicidal ideation Duty to Warn:no Physical Aggression /Violence:No  Access to Firearms a concern: No  Gang Involvement:No    [name] presented for his session in person at the [employer]. Clinician presented from her office located at the [facility] [name] consented to treatment.   [name] presented for his session in a positive mood. [name] identified that he finds himself  angry at people and imagines doing terrible things to them. [name] stats that he gets anxious and finds himself over thinking which in turn increases his anxiety. Patient states he has had to go up to taking nearly his recommended highest does of his anxiety medication to keep himself calm enough to function. Patient states that as he has got older he doesn't feel he has as many options to handle uncomfortable situations as when he was younger.  Clinician provided support and encouragement via active listening and verbal feedback. Clinician processed with patient his feelings  and concerns. Clinician encouraged patient to identify changes over the past couple of months that may have an effect on him. Clinician also processed with patient that being able to identify certain situations could also lead to insight into the amount of frustration. Patient did identify that he will often bite his tongue and want to say something and feels that it could be that he never addresses the situation. Clinician and patient again processed setting boundaries in relationships and feeling comfortable with those who are supposed to be in our circle to share our feelings without judgement.  [name] was fully engaged  Interventions: {PSY:[phone]}  Diagnosis: No diagnosis found.   Plan: ***Patient is to use CBT, mindfulness and coping skills to help manage decrease symptoms associated with their diagnosis.   Long-term goal:   ***Reduce overall level, frequency, and intensity of the feelings of depression, anxiety and panic evidenced by       decreased irritability, negative self talk, and helpless feelings from 6 to 7 days/week to 0 to 1 days/week per client report for at least 3 consecutive months.  Short-term goal:  ***Verbally express understanding of the relationship between feelings of depression, anxiety and their impact on thinking patterns and behaviors. Verbalize an understanding of the role that distorted thinking plays in creating fears, excessive worry, and ruminations.  Damien Junk MSW, LCSW/DATE

## 2024-04-12 NOTE — Telephone Encounter (Signed)
 Please send in refill

## 2024-05-02 ENCOUNTER — Ambulatory Visit: Admitting: Licensed Clinical Social Worker

## 2024-05-16 ENCOUNTER — Other Ambulatory Visit: Payer: Self-pay

## 2024-05-16 ENCOUNTER — Encounter: Payer: Self-pay | Admitting: Internal Medicine

## 2024-05-16 MED ORDER — LORAZEPAM 2 MG PO TABS: 2.0000 mg | ORAL_TABLET | Freq: Three times a day (TID) | ORAL | 0 refills | Status: DC | PRN

## 2024-05-16 MED ORDER — LORAZEPAM 2 MG PO TABS: 2.0000 mg | ORAL_TABLET | Freq: Three times a day (TID) | ORAL | 2 refills | Status: DC | PRN

## 2024-05-19 ENCOUNTER — Ambulatory Visit (INDEPENDENT_AMBULATORY_CARE_PROVIDER_SITE_OTHER): Admitting: Licensed Clinical Social Worker

## 2024-05-19 DIAGNOSIS — F411 Generalized anxiety disorder: Secondary | ICD-10-CM | POA: Diagnosis not present

## 2024-05-21 NOTE — Progress Notes (Signed)
 [facility] Counselor/Therapist Progress Note  Patient ID: [name], MRN: [id number]    Date: 05/19/2024  Time Spent: 1106  am - 1210 pm : 64 Minutes  Treatment Type: Individual Therapy.  Reported Symptoms: ruminating thoughts, muscle tension, feeling flushed when feeling anxious   Mental Status Exam: Appearance:  Neat and Well Groomed     Behavior: Appropriate  Motor: Normal  Speech/Language:  Clear and Coherent and Normal Rate  Affect: Appropriate  Mood: normal  Thought process: normal  Thought content:   WNL  Sensory/Perceptual disturbances:   WNL  Orientation: oriented to person, place, and situation  Attention: Good  Concentration: Good  Memory: WNL  Fund of knowledge:  Good  Insight:   Good  Judgment:  Good  Impulse Control: Good    Risk Assessment: Danger to Self:  No Patient denied current suicidal ideation  Self-injurious Behavior: No Danger to Others: No Patient denied current homicidal ideation Duty to Warn:no Physical Aggression /Violence:No  Access to Firearms a concern: No  Gang Involvement:No    [name] presented for his session in person at the [employer]. Clinician presented from her office located at the [facility]. [name] consented to treatment.    [name] presented for his session reporting that he has not been doing so well. He reports that he has found himself feeling, down over the past few weeks. He reports that he has had several issues with a hot water  heater and a heat pump. He reports that this was money he had to pay out. He also reports that he had a disagreement with his sister last week. He reports that she was being rude and then yelled at him. He reports that he hung up on her and didn't call her back. He reports that she called the next day acting like nothing was wrong. He states that he has noticed that he hasn't been playing with his new puppy as much and often feels as though he has no  interest in things. He reports that he does continue ot meet with friends to play cards and has been taking care of things he needs to, but otherwise no desire to do things.  Clinician provided support and encouragement via verbal feedback and active listening. Clinician and patient processed the feelings he has been having. Clinician pointed out that patient had previous concerns about finances and it could be that this was an underlying concern. Clinician and patient also discussed boundaries with his sister and how they are to protect him and he shouldn't feel guilty. Clinician encouraged patient to make time for himself and get out more and not spend so much time inside and alone.   [name] was fully engaged in session and processed openly with Clinician. [name] reports that he took an additional anxiety pill the other day because his anxiety was overly high. He reports that he is always worried about taking more. He does report that he never takes more than the 3 daily he is allowed. [name] will continue to work on his anxiety using coping skills such as grounding and deep breathing.  Patient is to use CBT, mindfulness and coping skills to help manage decrease symptoms associated with their diagnosis. Treatment planning to be reviewed by 12/15/2024.   Interventions: Cognitive Behavioral Therapy, Dialectical Behavioral Therapy, Assertiveness/Communication, Motivational Interviewing, Solution-Oriented/Positive Psychology, and Insight-Oriented  Diagnosis: Generalized Anxiety Disorder   Damien Junk MSW, LCSW/DATE 05/19/2024

## 2024-05-25 DIAGNOSIS — Z85828 Personal history of other malignant neoplasm of skin: Secondary | ICD-10-CM | POA: Diagnosis not present

## 2024-05-25 DIAGNOSIS — L570 Actinic keratosis: Secondary | ICD-10-CM | POA: Diagnosis not present

## 2024-05-25 DIAGNOSIS — L905 Scar conditions and fibrosis of skin: Secondary | ICD-10-CM | POA: Diagnosis not present

## 2024-05-25 DIAGNOSIS — D485 Neoplasm of uncertain behavior of skin: Secondary | ICD-10-CM | POA: Diagnosis not present

## 2024-05-25 DIAGNOSIS — C4442 Squamous cell carcinoma of skin of scalp and neck: Secondary | ICD-10-CM | POA: Diagnosis not present

## 2024-05-25 DIAGNOSIS — D692 Other nonthrombocytopenic purpura: Secondary | ICD-10-CM | POA: Diagnosis not present

## 2024-05-31 ENCOUNTER — Encounter: Payer: Self-pay | Admitting: Internal Medicine

## 2024-06-08 ENCOUNTER — Ambulatory Visit (INDEPENDENT_AMBULATORY_CARE_PROVIDER_SITE_OTHER): Admitting: Licensed Clinical Social Worker

## 2024-06-08 DIAGNOSIS — F439 Reaction to severe stress, unspecified: Secondary | ICD-10-CM

## 2024-06-08 DIAGNOSIS — F411 Generalized anxiety disorder: Secondary | ICD-10-CM

## 2024-06-09 NOTE — Progress Notes (Addendum)
 [facility] Counselor/Therapist Progress Note  Patient ID: Georgio Hattabaugh, MRN: [id number]    Date: 06/08/2024  Time Spent: 0100  pm - 0159 pm : 59 Minutes  Treatment Type: Individual Therapy.  Reported Symptoms: ruminating thoughts, muscle tension, feeling flushed when feeling anxious   Mental Status Exam: Appearance:  Neat and Well Groomed     Behavior: Appropriate  Motor: Normal  Speech/Language:  Clear and Coherent and Normal Rate  Affect: Appropriate  Mood: normal  Thought process: normal  Thought content:   WNL  Sensory/Perceptual disturbances:   WNL  Orientation: oriented to person, place, and situation  Attention: Good  Concentration: Good  Memory: WNL  Fund of knowledge:  Good  Insight:   Good  Judgment:  Good  Impulse Control: Good    Risk Assessment: Danger to Self:  No Patient denied current suicidal ideation  Self-injurious Behavior: No Danger to Others: No Patient denied current homicidal ideation Duty to Warn:no Physical Aggression /Violence:No  Access to Firearms a concern: No  Gang Involvement:No    Elsie [name] Louder presented for his session in person at the [employer]. Clinician presented from her office located at the [facility]. [name] consented to treatment.   [name] presented for his session in a more positive space than his last session. He reports that he has been doing well and staying busy with the [employer]. 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 [name] reports that he has been playing cards with his group and has been finding he has been getting out more now that the weather is more cool. [name] reports that he continues to struggle with anxiety and reports that he does at times have to take an anxiety pill. He reports that now he has never taken more than 2 in a given day. [name] reports that he struggles with getting things on his mind and then ruminating about it.  Clinician  provided support and encouragement via active listening and verbal interaction. Clinician processed with patient his concerns and discussed ideas for reducing anxiety. Clinician provided key tips including: To practice mindfulness to stay grounded in the present, distract yourself by engaging in hobbies or physical activity, challenge negative thoughts by questioning their validity, or try relaxation techniques like meditation or journaling. Identifying triggers, such as specific people or times of day, and consciously avoiding them can also be an effective strategy.   [name] was fully involved in session discussion with therapist. Patient was polite and cooperative and motivated for treatment as evidenced by his engagement in therapy and use of coping skills. [name] will reduce overall level, frequency, and intensity of the feelings of depression and anxiety evidenced by decreased irritability, negative self talk, and helpless feelings from 6 to 7 days/week to 0 to 1 days/week per client report for at least 3 consecutive months. Patient is to use CBT, mindfulness and coping skills to help manage decrease symptoms associated with their diagnosis. Treatment planning to be reviewed by 12/15/2024.    Interventions: Cognitive Behavioral Therapy, Dialectical Behavioral Therapy, Assertiveness/Communication, Mindfulness Meditation, and Solution-Oriented/Positive Psychology  Diagnosis: Generalized anxiety Disorder   Damien Junk MSW, LCSW/DATE 06/08/2024

## 2024-06-22 ENCOUNTER — Ambulatory Visit: Admitting: Licensed Clinical Social Worker

## 2024-06-27 ENCOUNTER — Other Ambulatory Visit (INDEPENDENT_AMBULATORY_CARE_PROVIDER_SITE_OTHER)

## 2024-06-27 ENCOUNTER — Encounter: Admitting: Internal Medicine

## 2024-06-27 DIAGNOSIS — E559 Vitamin D deficiency, unspecified: Secondary | ICD-10-CM | POA: Diagnosis not present

## 2024-06-27 DIAGNOSIS — N1832 Chronic kidney disease, stage 3b: Secondary | ICD-10-CM | POA: Diagnosis not present

## 2024-06-27 DIAGNOSIS — E1122 Type 2 diabetes mellitus with diabetic chronic kidney disease: Secondary | ICD-10-CM | POA: Diagnosis not present

## 2024-06-27 LAB — HEMOGLOBIN A1C: Hgb A1c MFr Bld: 5.8 % (ref 4.6–6.5)

## 2024-06-27 LAB — LIPID PANEL
Cholesterol: 128 mg/dL (ref 0–200)
HDL: 50.2 mg/dL (ref >39.00)
LDL Cholesterol: 61 mg/dL (ref 0–99)
NonHDL: 77.6
Total CHOL/HDL Ratio: 3
Triglycerides: 84 mg/dL (ref 0.0–149.0)
VLDL: 16.8 mg/dL (ref 0.0–40.0)

## 2024-06-27 LAB — HEPATIC FUNCTION PANEL
ALT: 20 U/L (ref 0–53)
AST: 23 U/L (ref 0–37)
Albumin: 4.3 g/dL (ref 3.5–5.2)
Alkaline Phosphatase: 33 U/L — ABNORMAL LOW (ref 39–117)
Bilirubin, Direct: 0.1 mg/dL (ref 0.0–0.3)
Total Bilirubin: 0.4 mg/dL (ref 0.2–1.2)
Total Protein: 6.8 g/dL (ref 6.0–8.3)

## 2024-06-27 LAB — BASIC METABOLIC PANEL WITH GFR
BUN: 33 mg/dL — ABNORMAL HIGH (ref 6–23)
CO2: 24 meq/L (ref 19–32)
Calcium: 9.6 mg/dL (ref 8.4–10.5)
Chloride: 104 meq/L (ref 96–112)
Creatinine, Ser: 1.33 mg/dL (ref 0.40–1.50)
GFR: 52.92 mL/min — ABNORMAL LOW (ref >60.00)
Glucose, Bld: 115 mg/dL — ABNORMAL HIGH (ref 70–99)
Potassium: 3.9 meq/L (ref 3.5–5.1)
Sodium: 142 meq/L (ref 135–145)

## 2024-06-27 LAB — VITAMIN D 25 HYDROXY (VIT D DEFICIENCY, FRACTURES): VITD: 51.83 ng/mL (ref 30.00–100.00)

## 2024-06-28 ENCOUNTER — Encounter: Payer: Self-pay | Admitting: Internal Medicine

## 2024-06-28 ENCOUNTER — Other Ambulatory Visit: Payer: Self-pay

## 2024-06-28 MED ORDER — OMEPRAZOLE 20 MG PO CPDR: 20.0000 mg | DELAYED_RELEASE_CAPSULE | Freq: Every day | ORAL | 3 refills | Status: AC | PRN

## 2024-07-04 ENCOUNTER — Ambulatory Visit (INDEPENDENT_AMBULATORY_CARE_PROVIDER_SITE_OTHER): Admitting: Internal Medicine

## 2024-07-04 ENCOUNTER — Telehealth: Payer: Self-pay | Admitting: Internal Medicine

## 2024-07-04 ENCOUNTER — Encounter: Payer: Self-pay | Admitting: Internal Medicine

## 2024-07-04 VITALS — BP 128/78 | HR 63 | Temp 98.9°F | Ht 72.0 in | Wt 181.0 lb

## 2024-07-04 DIAGNOSIS — R413 Other amnesia: Secondary | ICD-10-CM

## 2024-07-04 DIAGNOSIS — D225 Melanocytic nevi of trunk: Secondary | ICD-10-CM | POA: Insufficient documentation

## 2024-07-04 DIAGNOSIS — E782 Mixed hyperlipidemia: Secondary | ICD-10-CM

## 2024-07-04 DIAGNOSIS — Z125 Encounter for screening for malignant neoplasm of prostate: Secondary | ICD-10-CM

## 2024-07-04 DIAGNOSIS — I10 Essential (primary) hypertension: Secondary | ICD-10-CM | POA: Diagnosis not present

## 2024-07-04 DIAGNOSIS — E1122 Type 2 diabetes mellitus with diabetic chronic kidney disease: Secondary | ICD-10-CM | POA: Diagnosis not present

## 2024-07-04 DIAGNOSIS — E559 Vitamin D deficiency, unspecified: Secondary | ICD-10-CM | POA: Diagnosis not present

## 2024-07-04 DIAGNOSIS — Z86008 Personal history of in-situ neoplasm of other site: Secondary | ICD-10-CM | POA: Insufficient documentation

## 2024-07-04 DIAGNOSIS — Z7984 Long term (current) use of oral hypoglycemic drugs: Secondary | ICD-10-CM

## 2024-07-04 DIAGNOSIS — N1832 Chronic kidney disease, stage 3b: Secondary | ICD-10-CM

## 2024-07-04 DIAGNOSIS — Z23 Encounter for immunization: Secondary | ICD-10-CM | POA: Diagnosis not present

## 2024-07-04 DIAGNOSIS — I872 Venous insufficiency (chronic) (peripheral): Secondary | ICD-10-CM | POA: Insufficient documentation

## 2024-07-04 NOTE — Telephone Encounter (Signed)
 Ok sure, but I decline to do this until later today due to time constraints

## 2024-07-04 NOTE — Telephone Encounter (Signed)
 Pt stated that he will like labs ordered for him 2 weeks prior to his April appt-- after this request is completed please let FD know so a lab appt can be scheduled.. Please advise, Thanks

## 2024-07-04 NOTE — Progress Notes (Unsigned)
 Patient ID: [name], male   DOB: [birth year], 73 y.o.   MRN: [id number]        Chief Complaint: follow up HTN, HLD and hyperglycemia ***       HPI:  [name] is a 73 y.o. male here with c/o        Only taking lasix  40 mg prn wt increased over 181  Saw Dr Bensimohn until 2023, now seeing Dr Burton.  Also seeing Dr Shireen for urology yearly for prostate.  Has seen derm recently with squamous cell ca, sees regularly with hx of melanoma.     Going to counseling therapy regularly.  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, head   Cataract    bilateral   CHF (congestive heart failure) (HCC)    Chronic systolic heart failure (HCC)    Coronary artery disease    Depression    ED (erectile dysfunction)    GERD (gastroesophageal reflux disease)    Heart failure (HCC)    History of heart artery stent    History of ST elevation myocardial infarction (STEMI)    Hyperlipidemia    Hypertension    Kidney stone 07/29/2011   passed    Mood disorder    Myocardial infarction [facility])    Neuromuscular disorder (HCC)    neuropathy legs   Psoriasis    Stage 3b chronic kidney disease (CKD) (HCC)    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 Sydnor at [facility]    Past Surgical History:  Procedure Laterality Date   BASAL CELL CARCINOMA EXCISION     removed from scalp   CATARACT EXTRACTION W/PHACO Left 07/13/2023   Procedure: CATARACT EXTRACTION PHACO AND INTRAOCULAR LENS PLACEMENT (IOC) LEFT DIABETIC 3.20 00:29.8;  Surgeon: Myrna Adine Anes, MD;  Location: [facility];  Service: Ophthalmology;  Laterality: Left;   CATARACT EXTRACTION W/PHACO Right 08/03/2023   Procedure: CATARACT EXTRACTION PHACO AND INTRAOCULAR LENS PLACEMENT (IOC) RIGHT DIABETIC 2.37 00:22.9;  Surgeon: Myrna Adine Anes, MD;  Location: [facility];  Service: Ophthalmology;  Laterality: Right;   COLONOSCOPY  07/03/2017   per Dr. Leigh, sessile serrated polyp, repeat in 5 yrs    HERNIA REPAIR     LEFT HEART CATH AND CORONARY ANGIOGRAPHY N/A 01/03/2021   Procedure: LEFT HEART CATH AND CORONARY ANGIOGRAPHY;  Surgeon: Burnard Debby LABOR, MD;  Location: [facility] INVAS[facility] CV [facility];  Service: Cardiovascular;  Laterality: N/A;   RIGHT HEART CATH N/A 01/03/2021   Procedure: RIGHT HEART CATH;  Surgeon: Cherrie Toribio SAUNDERS, MD;  Location: [facility] INVASIVE CV LAB;  Service: Cardiovascular;  Laterality: N/A;    reports that he has never smoked. He has never been exposed to tobacco smoke. He has never used smokeless tobacco. He reports that he does not currently use alcohol. He reports that he does not use drugs. family history includes Alzheimer's disease in an other family member; Depression in  an other family member; Diabetes in an other family member; Hyperlipidemia in his mother and another family member; Hypertension in his mother and another family member. Allergies  Allergen Reactions   Codeine Other (See Comments)    hallucinations   Lisinopril  Cough   Nsaids     Not supposed to take because of kidneys    Current Outpatient Medications on File Prior to Visit  Medication Sig Dispense Refill   acetaminophen  (TYLENOL ) 650 MG CR tablet Take 650 mg by mouth every 8 (eight) hours as needed for pain.     aspirin  EC 81 MG tablet Take 81 mg by mouth daily. Swallow whole.     carvedilol  (COREG ) 3.125 MG tablet Take 1 tablet (3.125 mg total) by mouth 2 (two) times daily. 180 tablet 3   Cholecalciferol 50 MCG (2000 UT) TABS Take 2 tablets by mouth daily.     dapagliflozin  propanediol  (FARXIGA ) 10 MG TABS tablet Take 1 tablet (10 mg total) by mouth daily before breakfast. 90 tablet 3   DULoxetine  HCl 40 MG CPEP Take 1 capsule (40 mg total) by mouth daily. 90 capsule 3   finasteride  ([name] ) 5 MG tablet Take 1 tablet (5 mg total) by mouth daily. 90 tablet 3   furosemide  (LASIX ) 40 MG tablet Take 1 tab by mouth once daily as needed for wt > 180 90 tablet 3   ketoconazole  (NIZORAL ) 2 % cream Apply 1 application topically daily as needed for irritation.     LORazepam  (ATIVAN ) 2 MG tablet Take 1 tablet (2 mg total) by mouth every 8 (eight) hours as needed for anxiety. 90 tablet 2   melatonin 3 MG TABS tablet Take 1 tablet by mouth daily.     nitroGLYCERIN  (NITROSTAT ) 0.4 MG SL tablet Place 1 tablet (0.4 mg total) under the tongue every 5 (five) minutes as needed for chest pain. 50 tablet 3   omeprazole  (PRILOSEC) 20 MG capsule Take 1 capsule (20 mg total) by mouth daily as needed. 90 capsule 3   predniSONE  (DELTASONE ) 20 MG tablet 2 tabs po daily for 5 days, then 1 tab po daily for 5 days 15 tablet 0   rosuvastatin  (CRESTOR ) 20 MG tablet Take 1 tablet (20 mg total) by mouth daily. 90 tablet 3   sacubitril -valsartan  (ENTRESTO ) 24-26 MG Take 1 tablet by mouth 2 (two) times daily. 60 tablet 11   sildenafil  (REVATIO ) 20 MG tablet Take 20 mg by mouth as needed.     tizanidine  (ZANAFLEX ) 2 MG capsule Take 1 capsule (2 mg total) by mouth 3 (three) times daily. 60 capsule 2   triamcinolone cream (KENALOG) 0.1 % Apply 1 application  topically 2 (two) times daily as needed (psoriasis).     diclofenac  Sodium (VOLTAREN ) 1 % GEL Apply 2 g topically 2 (two) times daily as needed (pain). (Patient not taking: Reported on 07/04/2024)     No current facility-administered medications on file prior to visit.        ROS:  All others reviewed and negative.  Objective        PE:  BP 128/78 (BP Location: Right Arm, Patient Position: Sitting, Cuff Size: Normal)   Pulse 63   Temp 98.9 F (37.2 C)  (Oral)   Ht 6' (1.829 m)   Wt 181 lb (82.1 kg)   SpO2 98%   BMI 24.55 kg/m                 Constitutional: Pt appears in NAD  HENT: Head: NCAT.                Right Ear: External ear normal.                 Left Ear: External ear normal.                Eyes: . Pupils are equal, round, and reactive to light. Conjunctivae and EOM are normal               Nose: without d/c or deformity               Neck: Neck supple. Gross normal ROM               Cardiovascular: Normal rate and regular rhythm.                 Pulmonary/Chest: Effort normal and breath sounds without rales or wheezing.                Abd:  Soft, NT, ND, + BS, no organomegaly               Neurological: Pt is alert. At baseline orientation, motor grossly intact               Skin: Skin is warm. No rashes, no other new lesions, LE edema - ***               Psychiatric: Pt behavior is normal without agitation   Micro: none  Cardiac tracings I have personally interpreted today:  none  Pertinent Radiological findings (summarize): none   Lab Results  Component Value Date   WBC 4.1 12/29/2023   HGB 15.6 12/29/2023   HCT 46.4 12/29/2023   PLT 155.0 12/29/2023   GLUCOSE 115 (H) 06/27/2024   CHOL 128 06/27/2024   TRIG 84.0 06/27/2024   HDL 50.20 06/27/2024   LDLDIRECT 141.9 04/06/2013   LDLCALC 61 06/27/2024   ALT 20 06/27/2024   AST 23 06/27/2024   NA 142 06/27/2024   K 3.9 06/27/2024   CL 104 06/27/2024   CREATININE 1.33 06/27/2024   BUN 33 (H) 06/27/2024   CO2 24 06/27/2024   TSH 1.62 12/29/2023   PSA 0.91 12/29/2023   INR 1.5 (H) 01/03/2021   HGBA1C 5.8 06/27/2024   MICROALBUR 3.8 (H) 12/29/2023   Assessment/Plan:  [name] is a 73 y.o. White or Caucasian [1] male with  has a past medical history of Anoxic brain injury (HCC), Anxiety, Anxiety, Bilateral hearing loss, Cancer (HCC) (2010), Cataract, CHF (congestive heart failure) (HCC), Chronic systolic heart failure (HCC), Coronary  artery disease, Depression, ED (erectile dysfunction), GERD (gastroesophageal reflux disease), Heart failure (HCC), History of heart artery stent, History of ST elevation myocardial infarction (STEMI), Hyperlipidemia, Hypertension, Kidney stone (07/29/2011), Mood disorder, Myocardial infarction (HCC), Neuromuscular disorder (HCC), Psoriasis, Stage 3b chronic kidney disease (CKD) (HCC), 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  No problem-specific Assessment & Plan notes found for this encounter.  Followup: No follow-ups on file.  Lynwood Rush, MD 07/04/2024 11:01 AM [facility]

## 2024-07-04 NOTE — Patient Instructions (Addendum)
You had the flu shot today  /Please continue all other medications as before, and refills have been done if requested.  Please have the pharmacy call with any other refills you may need.  Please continue your efforts at being more active, low cholesterol diet, and weight control  Please keep your appointments with your specialists as you may have planned  Please make an Appointment to return in 6 months, or sooner if needed, also with Lab Appointment for testing done 3-5 days before at the FIRST FLOOR Lab (so this is for TWO appointments - please see the scheduling desk as you leave)

## 2024-07-06 ENCOUNTER — Ambulatory Visit (INDEPENDENT_AMBULATORY_CARE_PROVIDER_SITE_OTHER): Admitting: Licensed Clinical Social Worker

## 2024-07-06 DIAGNOSIS — F411 Generalized anxiety disorder: Secondary | ICD-10-CM

## 2024-07-06 DIAGNOSIS — F439 Reaction to severe stress, unspecified: Secondary | ICD-10-CM

## 2024-07-06 NOTE — Progress Notes (Signed)
 [facility] Counselor/Therapist Progress Note  Patient ID: [name], MRN: [id number]    Date: 07/06/24  Time Spent: 103  pm - 0200 pm : 57 Minutes  Treatment Type: Individual Therapy.  Reported Symptoms: ruminating thoughts, muscle tension, feeling flushed when feeling anxious   Mental Status Exam: Appearance:  Neat and Well Groomed     Behavior: Appropriate  Motor: Normal  Speech/Language:  Clear and Coherent and Normal Rate  Affect: Appropriate  Mood: normal  Thought process: normal  Thought content:   WNL  Sensory/Perceptual disturbances:   WNL  Orientation: oriented to person, place, and situation  Attention: Good  Concentration: Good  Memory: WNL  Fund of knowledge:  Good  Insight:   Good  Judgment:  Good  Impulse Control: Good    Risk Assessment: Danger to Self:  No Patient denied current suicidal ideation  Self-injurious Behavior: No Danger to Others: No Patient denied current homicidal ideation Duty to Warn:no Physical Aggression /Violence:No  Access to Firearms a concern: No  Gang Involvement:No    [name] presented for his session in person at the [employer]. Clinician presented from her office located at the [facility]. [name] consented to treatment.    [name] presented for his session in a positive mood. He reported that he has not had any luck with his pain doctor as they gave him steroids and they weren't helpful. He reports issues with lower back. Patient reports that he has been doing well. He reports only taking an extra anxiety medication a couple of days. He feels he is coping better on his own. He has found a new routine of taking his medication and he reports that it is helping. Patient reports more energy and work on not procrastinating. He reports being busy outside and the dog Zada is doing well. He reports that he had an issues with his sister, but he reports since then she has been better. Patient also  opened up and shared some specifics about his childhood and the dynamics in his home with his sister.   Clinician actively listened and provided support and feedback. Clinician and patient processed his improvement. Patient and Clinician processed memories of his childhood and his father. Patient reports there are few memories that are good of his father. Clinician and patient processed the unhealthy dynamics of the family due to patients father's behaviors.  [name] was fully involved in session discussion with therapist. Patient was polite and cooperative and motivated for treatment as evidenced by his engagement in therapy and use of coping skills. [name] will reduce overall level, frequency, and intensity of the feelings of depression and anxiety evidenced by decreased irritability, negative self talk, and helpless feelings from 6 to 7 days/week to 0 to 1 days/week per client report for at least 3 consecutive months. Patient is to use CBT, mindfulness and coping skills to help manage decrease symptoms associated with their diagnosis. Treatment planning to be reviewed by 12/15/2024.     Interventions: Cognitive Behavioral Therapy, Dialectical Behavioral Therapy, Assertiveness/Communication, Mindfulness Meditation, and Solution-Oriented/Positive Psychology   Diagnosis: Generalized anxiety Disorder      Damien Junk MSW, LCSW/DATE 07/06/2024

## 2024-07-07 ENCOUNTER — Encounter: Payer: Self-pay | Admitting: Internal Medicine

## 2024-07-07 DIAGNOSIS — R413 Other amnesia: Secondary | ICD-10-CM | POA: Insufficient documentation

## 2024-07-07 NOTE — Assessment & Plan Note (Signed)
 BP Readings from Last 3 Encounters:  07/04/24 128/78  04/04/24 120/70  03/30/24 132/80   Stable, pt to continue medical treatment coreg  3.125 mg bid, entresto  24 26 mg qd

## 2024-07-07 NOTE — Assessment & Plan Note (Signed)
 Lab Results  Component Value Date   LDLCALC 61 06/27/2024   Stable, pt to continue current statin crestor  20 mg qd

## 2024-07-07 NOTE — Assessment & Plan Note (Signed)
 Last vitamin D  Lab Results  Component Value Date   VD25OH 51.83 06/27/2024   Stable, cont oral replacement

## 2024-07-07 NOTE — Assessment & Plan Note (Addendum)
 Lab Results  Component Value Date   HGBA1C 5.8 06/27/2024   Stable, pt to continue current medical treatment farxiga  10 mg every day, without long term insulin   With ckd 3b Lab Results  Component Value Date   CREATININE 1.33 06/27/2024   Stable overall, cont to avoid nephrotoxins, f/u renal as planned

## 2024-07-07 NOTE — Assessment & Plan Note (Signed)
?   Benign forgetfulness vs pseudodementia vs other - declines MRI or neurology for now,  to f/u any worsening symptoms or concerns

## 2024-07-20 ENCOUNTER — Ambulatory Visit (INDEPENDENT_AMBULATORY_CARE_PROVIDER_SITE_OTHER): Admitting: Licensed Clinical Social Worker

## 2024-07-20 DIAGNOSIS — F411 Generalized anxiety disorder: Secondary | ICD-10-CM | POA: Diagnosis not present

## 2024-07-20 DIAGNOSIS — F439 Reaction to severe stress, unspecified: Secondary | ICD-10-CM | POA: Diagnosis not present

## 2024-07-20 NOTE — Progress Notes (Signed)
 [facility] Counselor/Therapist Progress Note  Patient ID: [name], MRN: [id number]    Date: 07/20/24  Time Spent: 104  pm - 0200 pm : 56 Minutes  Treatment Type: Individual Therapy.  Reported Symptoms: ruminating thoughts, muscle tension, feeling flushed when feeling anxious   Mental Status Exam: Appearance:  Neat and Well Groomed     Behavior: Appropriate  Motor: Normal  Speech/Language:  Clear and Coherent and Normal Rate  Affect: Appropriate  Mood: normal  Thought process: normal  Thought content:   WNL  Sensory/Perceptual disturbances:   WNL  Orientation: oriented to person, place, and situation  Attention: Good  Concentration: Good  Memory: WNL  Fund of knowledge:  Good  Insight:   Good  Judgment:  Good  Impulse Control: Good    Risk Assessment: Danger to Self:  No Patient denied current suicidal ideation  Self-injurious Behavior: No Danger to Others: No Patient denied current homicidal ideation Duty to Warn:no Physical Aggression /Violence:No  Access to Firearms a concern: No  Gang Involvement:No    [name] presented for his session in person at the [employer]. Clinician presented from her office located at the [facility]. [name] consented to treatment.    [name] presented for his session in a positive mood. [name] reports that he likes this time of year. He states it is a busy time for him and he enjoys it. [name] states that he enjoys playing cards with his friends in the neighborhood. He states that he is concerned about the economy. [name] discussed some of his previous health issues. [name] reports that his sister has been making an effort to be more kind. [name] reports that he feels that his anxiety has improved and he has continued to make changes to help with that.    Clinician actively listened and provided support and feedback. Clinician and patient processed his improvement. Patient and Clinician processed  memories of his childhood and his father. Patient reports there are few memories that are good of his father. Clinician and patient processed the unhealthy dynamics of the family due to patients father's behaviors. [name] reports that his sister has been making an effort to be more kind.     [name] was fully involved in session discussion with therapist. Patient was polite and cooperative and motivated for treatment as evidenced by his engagement in therapy and use of coping skills. [name] will reduce overall level, frequency, and intensity of the feelings of depression and anxiety evidenced by decreased irritability, negative self talk, and helpless feelings from 6 to 7 days/week to 0 to 1 days/week per client report for at least 3 consecutive months. Patient is to use CBT, mindfulness and coping skills to help manage decrease symptoms associated with their diagnosis. Treatment planning to be reviewed by 12/15/2024.     Interventions: Cognitive Behavioral Therapy, Dialectical Behavioral Therapy, Assertiveness/Communication, Mindfulness Meditation, and Solution-Oriented/Positive Psychology   Diagnosis: Generalized anxiety Disorder      Damien Junk MSW, LCSW/DATE 07/20/2024

## 2024-08-04 ENCOUNTER — Emergency Department (HOSPITAL_COMMUNITY)

## 2024-08-04 ENCOUNTER — Other Ambulatory Visit: Payer: Self-pay

## 2024-08-04 ENCOUNTER — Emergency Department (HOSPITAL_COMMUNITY)
Admission: EM | Admit: 2024-08-04 | Discharge: 2024-08-04 | Disposition: A | Discharge status: Home / Self Care | Attending: Emergency Medicine | Admitting: Emergency Medicine

## 2024-08-04 ENCOUNTER — Ambulatory Visit: Admitting: Podiatry

## 2024-08-04 ENCOUNTER — Encounter (HOSPITAL_COMMUNITY): Payer: Self-pay | Admitting: Pharmacy Technician

## 2024-08-04 DIAGNOSIS — R0789 Other chest pain: Secondary | ICD-10-CM

## 2024-08-04 DIAGNOSIS — Z7982 Long term (current) use of aspirin: Secondary | ICD-10-CM | POA: Insufficient documentation

## 2024-08-04 DIAGNOSIS — I700 Atherosclerosis of aorta: Secondary | ICD-10-CM | POA: Diagnosis not present

## 2024-08-04 DIAGNOSIS — R072 Precordial pain: Secondary | ICD-10-CM | POA: Diagnosis not present

## 2024-08-04 DIAGNOSIS — R079 Chest pain, unspecified: Secondary | ICD-10-CM | POA: Diagnosis not present

## 2024-08-04 DIAGNOSIS — R030 Elevated blood-pressure reading, without diagnosis of hypertension: Secondary | ICD-10-CM

## 2024-08-04 DIAGNOSIS — I10 Essential (primary) hypertension: Secondary | ICD-10-CM | POA: Diagnosis not present

## 2024-08-04 LAB — CBC
HCT: 46 % (ref 39.0–52.0)
Hemoglobin: 15.4 g/dL (ref 13.0–17.0)
MCH: 31 pg (ref 26.0–34.0)
MCHC: 33.5 g/dL (ref 30.0–36.0)
MCV: 92.6 fL (ref 80.0–100.0)
Platelets: 159 K/uL (ref 150–400)
RBC: 4.97 MIL/uL (ref 4.22–5.81)
RDW: 12.9 % (ref 11.5–15.5)
WBC: 8 K/uL (ref 4.0–10.5)
nRBC: 0 % (ref 0.0–0.2)

## 2024-08-04 LAB — BASIC METABOLIC PANEL WITH GFR
Anion gap: 12 (ref 5–15)
BUN: 35 mg/dL — ABNORMAL HIGH (ref 8–23)
CO2: 22 mmol/L (ref 22–32)
Calcium: 9.2 mg/dL (ref 8.9–10.3)
Chloride: 104 mmol/L (ref 98–111)
Creatinine, Ser: 1.4 mg/dL — ABNORMAL HIGH (ref 0.61–1.24)
GFR, Estimated: 53 mL/min — ABNORMAL LOW (ref >60)
Glucose, Bld: 95 mg/dL (ref 70–99)
Potassium: 4 mmol/L (ref 3.5–5.1)
Sodium: 138 mmol/L (ref 135–145)

## 2024-08-04 LAB — TROPONIN I (HIGH SENSITIVITY)
Troponin I (High Sensitivity): 10 ng/L (ref <18)
Troponin I (High Sensitivity): 11 ng/L (ref <18)

## 2024-08-04 MED ORDER — ACETAMINOPHEN 500 MG PO TABS
1000.0000 mg | ORAL_TABLET | Freq: Once | ORAL | Status: AC
  Administered 2024-08-04: 1000 mg via ORAL
  Filled 2024-08-04: qty 2

## 2024-08-04 NOTE — ED Provider Notes (Signed)
 [place]  EMERGENCY DEPARTMENT AT [facility] Provider Note   CSN: [id number] Arrival date & time: 08/04/24  1503     Patient presents with: No chief complaint on file.   [name] is a 73 y.o. male.   Patient c/o transient sharp pain to mid chest at rest today. Lasts 2-3 seconds per episode, has occurred a few times. No associated sob, nv or diaphoresis. No exertional chest pain. No sob or unusual doe. Not similar to prior cardiac chest pain - hx cad/stent. No other recent chest pain. No exertional chest pain. No more prolonged episodes of chest pain. No leg pain or swelling. No pleuritic chest pain. No chest wall injury or strain. No heartburn. No cough or fever.   The history is provided by the patient, medical records and the EMS personnel.       Prior to Admission medications   Medication Sig Start Date End Date Taking? Authorizing Provider  acetaminophen  (TYLENOL ) 650 MG CR tablet Take 650 mg by mouth every 8 (eight) hours as needed for pain.    [provider]  aspirin  EC 81 MG tablet Take 81 mg by mouth daily. Swallow whole.    [provider]  carvedilol  (COREG ) 3.125 MG tablet Take 1 tablet (3.125 mg total) by mouth 2 (two) times daily. 03/30/24   Madie Jon Garre, PA  Cholecalciferol 50 MCG (2000 UT) TABS Take 2 tablets by mouth daily.    [provider]  dapagliflozin  propanediol (FARXIGA ) 10 MG TABS tablet Take 1 tablet (10 mg total) by mouth daily before breakfast. 03/30/24   Duke, Jon Garre, PA  diclofenac  Sodium (VOLTAREN ) 1 % GEL Apply 2 g topically 2 (two) times daily as needed (pain). Patient not taking: Reported on 07/04/2024    [provider]  DULoxetine  HCl 40 MG CPEP Take 1 capsule (40 mg total) by mouth daily. 01/04/24   Norleen Lynwood ORN, MD  finasteride  ([name] ) 5 MG tablet Take 1 tablet (5 mg total) by mouth daily. 06/11/22   Johnny Garnette LABOR, MD  furosemide  (LASIX ) 40 MG tablet Take 1 tab by mouth once daily  as needed for wt > 180 01/04/24   Norleen Lynwood ORN, MD  ketoconazole  (NIZORAL ) 2 % cream Apply 1 application topically daily as needed for irritation.    [provider]  LORazepam  (ATIVAN ) 2 MG tablet Take 1 tablet (2 mg total) by mouth every 8 (eight) hours as needed for anxiety. 05/16/24   Norleen Lynwood ORN, MD  melatonin 3 MG TABS tablet Take 1 tablet by mouth daily.    [provider]  nitroGLYCERIN  (NITROSTAT ) 0.4 MG SL tablet Place 1 tablet (0.4 mg total) under the tongue every 5 (five) minutes as needed for chest pain. 01/04/24   Norleen Lynwood ORN, MD  omeprazole  (PRILOSEC) 20 MG capsule Take 1 capsule (20 mg total) by mouth daily as needed. 06/28/24   Norleen Lynwood ORN, MD  rosuvastatin  (CRESTOR ) 20 MG tablet Take 1 tablet (20 mg total) by mouth daily. 03/30/24   Duke, Jon Garre, PA  sacubitril -valsartan  (ENTRESTO ) 24-26 MG Take 1 tablet by mouth 2 (two) times daily. 03/30/24   Duke, Jon Garre, PA  sildenafil  (REVATIO ) 20 MG tablet Take 20 mg by mouth as needed.    [provider]  tizanidine  (ZANAFLEX ) 2 MG capsule Take 1 capsule (2 mg total) by mouth 3 (three) times daily. 04/04/24   Copland, Jacques, MD  triamcinolone cream (KENALOG) 0.1 % Apply 1 application  topically 2 (two) times daily as needed (psoriasis).    [provider]    Allergies: Codeine, Lisinopril , and Nsaids    Review of Systems  Constitutional:  Negative for chills and fever.  HENT:  Negative for sore throat.   Respiratory:  Negative for cough and shortness of breath.   Cardiovascular:  Positive for chest pain. Negative for palpitations and leg swelling.  Gastrointestinal:  Negative for abdominal pain, nausea and vomiting.  Genitourinary:  Negative for dysuria and flank pain.  Musculoskeletal:  Negative for back pain and neck pain.  Skin:  Negative for rash.  Neurological:  Negative for headaches.    Updated Vital Signs BP (!) 155/87 (BP Location: Left Arm)   Pulse 66   Temp 98.9 F  (37.2 C) (Oral)   Resp 18   SpO2 98%   Physical Exam Vitals and nursing note reviewed.  Constitutional:      Appearance: Normal appearance. He is well-developed.  HENT:     Head: Atraumatic.     Nose: Nose normal.     Mouth/Throat:     Mouth: Mucous membranes are moist.     Pharynx: Oropharynx is clear.  Eyes:     General: No scleral icterus.    Conjunctiva/sclera: Conjunctivae normal.     Pupils: Pupils are equal, round, and reactive to light.  Neck:     Trachea: No tracheal deviation.  Cardiovascular:     Rate and Rhythm: Normal rate and regular rhythm.     Pulses: Normal pulses.     Heart sounds: Normal heart sounds. No murmur heard.    No friction rub. No gallop.  Pulmonary:     Effort: Pulmonary effort is normal. No accessory muscle usage or respiratory distress.     Breath sounds: Normal breath sounds.  Chest:     Chest wall: No tenderness.  Abdominal:     General: Bowel sounds are normal. There is no distension.     Palpations: Abdomen is soft.     Tenderness: There is no abdominal tenderness. There is no guarding.  Genitourinary:    Comments: No cva tenderness. Musculoskeletal:        General: No swelling or tenderness.     Cervical back: Normal range of motion and neck supple. No rigidity.     Right lower leg: No edema.     Left lower leg: No edema.  Skin:    General: Skin is warm and dry.     Findings: No rash.  Neurological:     Mental Status: He is alert.     Comments: Alert, speech clear. Motor/sens grossly intact bil.   Psychiatric:        Mood and Affect: Mood normal.     (all labs ordered are listed, but only abnormal results are displayed) Results for orders placed or performed during the hospital encounter of 08/04/24  Basic metabolic panel   Collection Time: 08/04/24  3:40 PM  Result Value Ref Range   Sodium 138 135 - 145 mmol/L   Potassium 4.0 3.5 - 5.1 mmol/L   Chloride 104 98 - 111 mmol/L   CO2 22 22 - 32 mmol/L   Glucose, Bld 95 70  - 99 mg/dL   BUN 35 (H) 8 - 23 mg/dL   Creatinine, Ser 8.59 (H) 0.61 - 1.24 mg/dL   Calcium  9.2 8.9 - 10.3 mg/dL   GFR, Estimated 53 (L) >60 mL/min   Anion gap 12 5 - 15  CBC  Collection Time: 08/04/24  3:40 PM  Result Value Ref Range   WBC 8.0 4.0 - 10.5 K/uL   RBC 4.97 4.22 - 5.81 MIL/uL   Hemoglobin 15.4 13.0 - 17.0 g/dL   HCT 53.9 60.9 - 47.9 %   MCV 92.6 80.0 - 100.0 fL   MCH 31.0 26.0 - 34.0 pg   MCHC 33.5 30.0 - 36.0 g/dL   RDW 87.0 88.4 - 84.4 %   Platelets 159 150 - 400 K/uL   nRBC 0.0 0.0 - 0.2 %  Troponin I (High Sensitivity)   Collection Time: 08/04/24  3:40 PM  Result Value Ref Range   Troponin I (High Sensitivity) 10 <18 ng/L  Troponin I (High Sensitivity)   Collection Time: 08/04/24  7:54 PM  Result Value Ref Range   Troponin I (High Sensitivity) 11 <18 ng/L   DG Chest 2 View Result Date: 08/04/2024 EXAM: 2 VIEW(S) XRAY OF THE CHEST 08/04/2024 04:09:00 PM COMPARISON: None available. CLINICAL HISTORY: chest pain FINDINGS: LUNGS AND PLEURA: No focal pulmonary opacity. No pleural effusion. No pneumothorax. HEART AND MEDIASTINUM: Aortic atherosclerosis. No acute abnormality of the cardiac and mediastinal silhouettes. BONES AND SOFT TISSUES: Multilevel degenerative changes of the thoracic spine. IMPRESSION: 1. No acute cardiopulmonary abnormality. Electronically signed by: Rogelia Myers MD 08/04/2024 04:27 PM EST RP Workstation: [id number]     EKG: EKG Interpretation Date/Time:  Thursday August 04 2024 15:32:06 EST Ventricular Rate:  66 PR Interval:  210 QRS Duration:  76 QT Interval:  376 QTC Calculation: 394 R Axis:   -2  Text Interpretation: Sinus rhythm with 1st degree A-V block Minimal voltage criteria for LVH, may be normal variant ( R in aVL ) Confirmed by Bernard Drivers ([id number]) on 08/04/2024 7:28:24 PM  Radiology: DG Chest 2 View Result Date: 08/04/2024 EXAM: 2 VIEW(S) XRAY OF THE CHEST 08/04/2024 04:09:00 PM COMPARISON: None available. CLINICAL  HISTORY: chest pain FINDINGS: LUNGS AND PLEURA: No focal pulmonary opacity. No pleural effusion. No pneumothorax. HEART AND MEDIASTINUM: Aortic atherosclerosis. No acute abnormality of the cardiac and mediastinal silhouettes. BONES AND SOFT TISSUES: Multilevel degenerative changes of the thoracic spine. IMPRESSION: 1. No acute cardiopulmonary abnormality. Electronically signed by: Rogelia Myers MD 08/04/2024 04:27 PM EST RP Workstation: [id number]     Procedures   Medications Ordered in the ED  acetaminophen  (TYLENOL ) tablet 1,000 mg (1,000 mg Oral Given 08/04/24 1946)                                    Medical Decision Making Problems Addressed: Atypical chest pain: acute illness or injury Elevated blood pressure reading: acute illness or injury Essential hypertension: chronic illness or injury Precordial chest pain: acute illness or injury with systemic symptoms that poses a threat to life or bodily functions  Amount and/or Complexity of Data Reviewed Independent Historian: EMS    Details: hx External Data Reviewed: notes. Labs: ordered. Decision-making details documented in ED Course. Radiology: ordered and independent interpretation performed. Decision-making details documented in ED Course. ECG/medicine tests: ordered and independent interpretation performed. Decision-making details documented in ED Course.  Risk OTC drugs. Decision regarding hospitalization.   Iv ns. Continuous pulse ox and cardiac monitoring. Labs ordered/sent. Imaging ordered.   Differential diagnosis includes acs, msk cp, gi cp, et c. Dispo decision including potential need for admission considered - will get labs and imaging and reassess.   Reviewed nursing notes and prior charts for additional history.  External reports reviewed. Additional history from:  Cardiac monitor: sinus rhythm, rate 68.  Labs reviewed/interpreted by me - initial trop normal. Wbc and hgb normal. Chem unremarkable.   Xrays  reviewed/interpreted by me - no pna.   Additional labs reviewed/interpreted by me - delta trop normal and not significantly increasing, felt not c/w acs.   Rec close pcp/cardiology f/u, referral made.   Return precautions provided.       Final diagnoses:  Precordial chest pain  Atypical chest pain  Elevated blood pressure reading  Essential hypertension    ED Discharge Orders          Ordered    Ambulatory referral to Cardiology       Comments: If you have not heard from the Cardiology office within the next 72 hours please call [phone].   08/04/24 2040               Bernard Drivers, MD 08/04/24 2144

## 2024-08-04 NOTE — Discharge Instructions (Addendum)
 It was our pleasure to provide your ER care today - we hope that you feel better.  Overall, your heart tests look good/reassuring.  For recent chest pain, follow up closely with cardiologist in the next 1-2 weeks - we made referral, and they should be contacting you with an appointment in the next few days.   Return to ER right away if worse, new symptoms, fevers, recurrent/persistent chest pain, increased trouble breathing, or other concern.

## 2024-08-04 NOTE — ED Triage Notes (Signed)
 PT [facility] from home for CP beginning around 11:30 while grocery shopping.  Prior hx of MI.  Mid-sternal radiating to neck, 7-10 initially.  1/10 at this time. No meds given.  Pt took 324 ASA at home.   150/70 98% HR 70, CBG 110.

## 2024-08-05 NOTE — ED Notes (Signed)
 Pt was DC at

## 2024-08-05 NOTE — ED Notes (Signed)
 Pt was DC at 2245.

## 2024-08-11 ENCOUNTER — Encounter: Payer: Self-pay | Admitting: Podiatry

## 2024-08-11 ENCOUNTER — Ambulatory Visit: Admitting: Podiatry

## 2024-08-11 DIAGNOSIS — L603 Nail dystrophy: Secondary | ICD-10-CM | POA: Diagnosis not present

## 2024-08-11 DIAGNOSIS — G5793 Unspecified mononeuropathy of bilateral lower limbs: Secondary | ICD-10-CM

## 2024-08-11 DIAGNOSIS — B351 Tinea unguium: Secondary | ICD-10-CM | POA: Diagnosis not present

## 2024-08-11 DIAGNOSIS — D2371 Other benign neoplasm of skin of right lower limb, including hip: Secondary | ICD-10-CM

## 2024-08-11 MED ORDER — GABAPENTIN 100 MG PO CAPS: 100.0000 mg | ORAL_CAPSULE | Freq: Three times a day (TID) | ORAL | 3 refills | Status: AC

## 2024-08-15 NOTE — Progress Notes (Signed)
 Subjective:  Patient ID: [name], male    DOB: October 29, 1950,  MRN: [id number] HPI Chief Complaint  Patient presents with   Toe Pain    Hallux right - toenail thick in areas and discolored, had some redness around the base of the nail, but better today   Foot Pain    Sub 5th MPJ right - small callused area that is tender with walking   New Patient (Initial Visit)    Patient states he does have neuropathy-confirmed by [facility]-wants a general check up-does still have some burning - no meds    73 y.o. male presents with the above complaint.   Denies fever chills nausea mobic muscle aches pains calf pain back pain chest pain shortness of breath.  Past Medical History:  Diagnosis Date   Anoxic brain injury (HCC)    Anxiety    Anxiety    Bilateral hearing loss    Cancer (HCC) 2010   basal cell ca, head   Cataract    bilateral   CHF (congestive heart failure) (HCC)    Chronic systolic heart failure (HCC)    Coronary artery disease    Depression    ED (erectile dysfunction)    GERD (gastroesophageal reflux disease)    Heart failure (HCC)    History of heart artery stent    History of ST elevation myocardial infarction (STEMI)    Hyperlipidemia    Hypertension    Kidney stone 07/29/2011   passed    Mood disorder    Myocardial infarction [facility])    Neuromuscular disorder (HCC)    neuropathy legs   Psoriasis    Stage 3b chronic kidney disease (CKD) (HCC)    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 Sydnor at [facility]    Past Surgical History:  Procedure Laterality Date   BASAL CELL CARCINOMA EXCISION     removed from scalp   CATARACT EXTRACTION W/PHACO Left 07/13/2023   Procedure: CATARACT EXTRACTION PHACO AND INTRAOCULAR LENS PLACEMENT (IOC) LEFT DIABETIC 3.20 00:29.8;  Surgeon: Myrna Adine Anes, MD;  Location: [facility];  Service: Ophthalmology;  Laterality: Left;    CATARACT EXTRACTION W/PHACO Right 08/03/2023   Procedure: CATARACT EXTRACTION PHACO AND INTRAOCULAR LENS PLACEMENT (IOC) RIGHT DIABETIC 2.37 00:22.9;  Surgeon: Myrna Adine Anes, MD;  Location: [facility];  Service: Ophthalmology;  Laterality: Right;   COLONOSCOPY  07/03/2017   per Dr. Leigh, sessile serrated polyp, repeat in 5 yrs    HERNIA REPAIR     LEFT HEART CATH AND CORONARY ANGIOGRAPHY N/A 01/03/2021   Procedure: LEFT HEART CATH AND CORONARY ANGIOGRAPHY;  Surgeon: Burnard Debby LABOR, MD;  Location: [facility];  Service: Cardiovascular;  Laterality: N/A;   RIGHT HEART CATH N/A 01/03/2021   Procedure: RIGHT HEART CATH;  Surgeon: Cherrie Toribio SAUNDERS, MD;  Location: [facility];  Service: Cardiovascular;  Laterality: N/A;    Current Outpatient Medications:    gabapentin  (NEURONTIN ) 100 MG capsule, Take 1 capsule (100 mg total) by mouth 3 (three) times daily., Disp: 90 capsule, Rfl: 3   acetaminophen  (TYLENOL ) 650 MG CR tablet, Take 650 mg by mouth every 8 (eight) hours as needed for pain., Disp: , Rfl:    aspirin  EC 81 MG tablet, Take 81 mg by mouth daily. Swallow whole., Disp: , Rfl:    carvedilol  (COREG ) 3.125 MG tablet, Take 1 tablet (3.125  mg total) by mouth 2 (two) times daily., Disp: 180 tablet, Rfl: 3   Cholecalciferol 50 MCG (2000 UT) TABS, Take 2 tablets by mouth daily., Disp: , Rfl:    dapagliflozin  propanediol (FARXIGA ) 10 MG TABS tablet, Take 1 tablet (10 mg total) by mouth daily before breakfast., Disp: 90 tablet, Rfl: 3   diclofenac  Sodium (VOLTAREN ) 1 % GEL, Apply 2 g topically 2 (two) times daily as needed (pain). (Patient not taking: Reported on 07/04/2024), Disp: , Rfl:    DULoxetine  HCl 40 MG CPEP, Take 1 capsule (40 mg total) by mouth daily., Disp: 90 capsule, Rfl: 3   finasteride  ([name] ) 5 MG tablet, Take 1 tablet (5 mg total) by mouth daily., Disp: 90 tablet, Rfl: 3   furosemide  (LASIX ) 40 MG tablet, Take 1 tab by mouth once daily as needed for  wt > 180, Disp: 90 tablet, Rfl: 3   ketoconazole  (NIZORAL ) 2 % cream, Apply 1 application topically daily as needed for irritation., Disp: , Rfl:    LORazepam  (ATIVAN ) 2 MG tablet, Take 1 tablet (2 mg total) by mouth every 8 (eight) hours as needed for anxiety., Disp: 90 tablet, Rfl: 2   melatonin 3 MG TABS tablet, Take 1 tablet by mouth daily., Disp: , Rfl:    nitroGLYCERIN  (NITROSTAT ) 0.4 MG SL tablet, Place 1 tablet (0.4 mg total) under the tongue every 5 (five) minutes as needed for chest pain., Disp: 50 tablet, Rfl: 3   omeprazole  (PRILOSEC) 20 MG capsule, Take 1 capsule (20 mg total) by mouth daily as needed., Disp: 90 capsule, Rfl: 3   rosuvastatin  (CRESTOR ) 20 MG tablet, Take 1 tablet (20 mg total) by mouth daily., Disp: 90 tablet, Rfl: 3   sacubitril -valsartan  (ENTRESTO ) 24-26 MG, Take 1 tablet by mouth 2 (two) times daily., Disp: 60 tablet, Rfl: 11   sildenafil  (REVATIO ) 20 MG tablet, Take 20 mg by mouth as needed., Disp: , Rfl:    tizanidine  (ZANAFLEX ) 2 MG capsule, Take 1 capsule (2 mg total) by mouth 3 (three) times daily., Disp: 60 capsule, Rfl: 2   triamcinolone cream (KENALOG) 0.1 %, Apply 1 application  topically 2 (two) times daily as needed (psoriasis)., Disp: , Rfl:   Allergies  Allergen Reactions   Codeine Other (See Comments)    hallucinations   Lisinopril  Cough   Nsaids     Not supposed to take because of kidneys    Review of Systems Objective:  There were no vitals filed for this visit.  General: Well developed, nourished, in no acute distress, alert and oriented x3   Dermatological: Skin is warm, dry and supple bilateral. Nails are thick yellow dystrophic clinically mycotic particularly the hallux right foot.  Remaining integument appears unremarkable at this time. There are no open sores, no preulcerative lesions, no rash or signs of infection present.  Multiple benign skin lesions plantar aspect forefoot bilateral  Vascular: Dorsalis Pedis artery and Posterior  Tibial artery pedal pulses are 2/4 bilateral with immedate capillary fill time. Pedal hair growth present. No varicosities and no lower extremity edema present bilateral.   Neruologic: Grossly intact via light touch bilateral. Vibratory intact via tuning fork bilateral. Protective threshold with Semmes Wienstein monofilament intact to all pedal sites bilateral. Patellar and Achilles deep tendon reflexes 2+ bilateral. No Babinski or clonus noted bilateral.   Musculoskeletal: No gross boney pedal deformities bilateral. No pain, crepitus, or limitation noted with foot and ankle range of motion bilateral. Muscular strength 5/5 in all groups tested bilateral.  Gait: Unassisted, Nonantalgic.    Radiographs:  None taken  Assessment & Plan:   Assessment: Neuropathy.  Nail dystrophy.  Benign skin lesions.  Plan: Discussed neuropathy in great detail.  Started him on gabapentin  100 mg 3 times a day he will taper up to that over the next 3 weeks.  Debrided all benign skin lesions.  Took samples of the toenails today.  I will follow-up with him in 1 month for those samples and to discuss his medications.     Jahmya Onofrio T. Maxwell, [place]

## 2024-08-26 ENCOUNTER — Encounter: Payer: Self-pay | Admitting: Gastroenterology

## 2024-08-30 ENCOUNTER — Encounter: Payer: Self-pay | Admitting: Internal Medicine

## 2024-08-30 MED ORDER — LORAZEPAM 2 MG PO TABS: 2.0000 mg | ORAL_TABLET | Freq: Three times a day (TID) | ORAL | 2 refills | Status: AC | PRN

## 2024-09-01 LAB — OPHTHALMOLOGY REPORT-SCANNED

## 2024-09-06 ENCOUNTER — Ambulatory Visit: Admitting: Licensed Clinical Social Worker

## 2024-09-08 ENCOUNTER — Ambulatory Visit: Admitting: Podiatry

## 2024-09-09 ENCOUNTER — Ambulatory Visit: Payer: Self-pay

## 2024-09-09 NOTE — Telephone Encounter (Signed)
 FYI Only or Action Required?: FYI only for provider: appointment scheduled on 09/12/24.  Patient was last seen in primary care on 07/04/2024 by Norleen Lynwood ORN, MD.  Called Nurse Triage reporting Dizziness.  Symptoms began several days ago.  Interventions attempted: OTC medications: Delsym, flonase .  Symptoms are: stable.  Triage Disposition: See Physician Within 24 Hours  Patient/caregiver understands and will follow disposition?: Yes       Reason for Disposition  [1] MODERATE dizziness (e.g., interferes with normal activities) AND [2] has NOT been evaluated by doctor (or NP/PA) for this  (Exception: Dizziness caused by heat exposure, sudden standing, or poor fluid intake.)  [1] Continuous (nonstop) coughing interferes with work or school AND [2] no improvement using cough treatment per Care Advice  Answer Assessment - Initial Assessment Questions Woke up walking toward the bathroom, felt dizzy, off balance, tiny vertigo, walking and lean the right or left, holding on to the furniture, currently not dizzy sitting in chair. He stood up and no dizziness. Patient reports having a cough, deep in chest, some pain in chest w/coughing depending on the cough, started on Monday with a little runny nose and cough, but worsening since Wednesday. Fatigue, tired, had headache last night. Taking Delsym and generic flonase  OTC, took COVID test this morning and was negative. Advised earliest visit is on Monday, 12/22, he agrees to that appointment.  1. DESCRIPTION: Describe your dizziness.     Off balance, light headed, leaning when walking  2. LIGHTHEADED: Do you feel lightheaded? (e.g., somewhat faint, woozy, weak upon standing)     Off balanced  3. VERTIGO: Do you feel like either you or the room is spinning or tilting? (i.e., vertigo)     Yes a little it was, but not now  4. SEVERITY: How bad is it?  Do you feel like you are going to faint? Can you stand and walk?     5  5.  ONSET:  When did the dizziness begin?     This morning getting out of bed  6. AGGRAVATING FACTORS: Does anything make it worse? (e.g., standing, change in head position)     Standing and bending over   8. CAUSE: What do you think is causing the dizziness? (e.g., decreased fluids or food, diarrhea, emotional distress, heat exposure, new medicine, sudden standing, vomiting; unknown)     Not sure  9. RECURRENT SYMPTOM: Have you had dizziness before? If Yes, ask: When was the last time? What happened that time?     Yes, related to the healing of cardiac event in 2022  10. OTHER SYMPTOMS: Do you have any other symptoms? (e.g., fever, chest pain, vomiting, diarrhea, bleeding)       Cough, chills/clammy  Answer Assessment - Initial Assessment Questions 1. ONSET: When did the cough begin?      Monday    3. SPUTUM: Describe the color of your sputum (e.g., none, dry cough; clear, white, yellow, green)     Nothing    5. DIFFICULTY BREATHING: Are you having difficulty breathing? If Yes, ask: How bad is it? (e.g., mild, moderate, severe)      No  6. FEVER: Do you have a fever? If Yes, ask: What is your temperature, how was it measured, and when did it start?     Not measured, but having chills, clammy  7. CARDIAC HISTORY: Do you have any history of heart disease? (e.g., heart attack, congestive heart failure)      Yes  10. OTHER SYMPTOMS: Do you have any other symptoms? (e.g., runny nose, wheezing, chest pain)       Dizziness this morning  Protocols used: Dizziness - Lightheadedness-A-AH, Cough - Acute Non-Productive-A-AH  Copied from CRM #[id number]. Topic: Clinical - Red Word Triage >> Sep 09, 2024 10:18 AM Carlyon D wrote: Red Word that prompted transfer to Nurse Triage: pt woke up very dizzy off balance, fatigue, pt states he needs to be careful prior to standing up he states he tilts to one side and has to grab something so he does not fall pt is  concerned

## 2024-09-12 ENCOUNTER — Encounter: Payer: Self-pay | Admitting: Family Medicine

## 2024-09-12 ENCOUNTER — Ambulatory Visit: Admitting: Family Medicine

## 2024-09-12 VITALS — BP 148/78 | HR 63 | Temp 97.7°F | Resp 18 | Ht 72.0 in | Wt 183.0 lb

## 2024-09-12 DIAGNOSIS — Z7984 Long term (current) use of oral hypoglycemic drugs: Secondary | ICD-10-CM

## 2024-09-12 DIAGNOSIS — J0100 Acute maxillary sinusitis, unspecified: Secondary | ICD-10-CM | POA: Diagnosis not present

## 2024-09-12 DIAGNOSIS — E11319 Type 2 diabetes mellitus with unspecified diabetic retinopathy without macular edema: Secondary | ICD-10-CM | POA: Diagnosis not present

## 2024-09-12 DIAGNOSIS — I10 Essential (primary) hypertension: Secondary | ICD-10-CM | POA: Diagnosis not present

## 2024-09-12 MED ORDER — AMOXICILLIN-POT CLAVULANATE 875-125 MG PO TABS: 1.0000 | ORAL_TABLET | Freq: Two times a day (BID) | ORAL | 0 refills | Status: AC

## 2024-09-12 NOTE — Progress Notes (Signed)
 "  Assessment & Plan Acute non-recurrent maxillary sinusitis Symptoms persisted for a week, warranting antibiotics. - Prescribed Augmentin  875 mg twice daily for 7 days. - Advised continued use of Coricidin and humidifiers. - Education provided on sinus infections. Orders:   amoxicillin -clavulanate (AUGMENTIN ) 875-125 MG tablet; Take 1 tablet by mouth 2 (two) times daily for 7 days.  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 Current medication Farxiga  supports kidney function and diabetes control. - Provided dietary guidance handout. - Advised regular A1c monitoring. - Offered nutritionist referral if needed. - Education provided on healthy eating for diabetes.    Essential hypertension Home readings around 130 mmHg; recent office reading 148 mmHg. Possible elevation due to illness. - Advised periodic home blood pressure monitoring. - Continue current management and lifestyle modifications.       Follow up plan: Return if symptoms worsen or fail to improve.  Niki Rung, MSN, APRN, FNP-C  Subjective:  HPI: [name] is a 73 y.o. male presenting on 09/12/2024 for Cough (Started last MON- deep cough, congestion (white-yellow), nasal drainage, sneezing, decreased appetite started yesterday. Unknown Fever, pos for HA, no GI, pos for body aches, ears are full)  Discussed the use of AI scribe software for clinical note transcription with the patient, who gave verbal consent to proceed.  He has been experiencing symptoms for the past week, including cough, congestion, nasal drainage, sneezing, decreased appetite, headache, body aches, and a sensation of fullness in the ears. The symptoms began with a sore throat. He has been using Flonase  two to three times a day and Coricidin for his symptoms, taking ten or eleven doses since Friday, not  exceeding the maximum dose. He is frustrated with over-the-counter medications due to their cost and uncertain efficacy.  He has a history of hypertension, with home blood pressure readings generally around 130 mmHg. He attributes this to not feeling well and notes that his blood pressure tends to rise when he is unwell. He usually checks his blood pressure in the morning before moving around.  He discusses a recent ophthalmology visit where a new spot was found on the back of his left eye, which was not present last year. The ophthalmologist expressed concern about his A1c levels, though his last recorded A1c was 5.8 in October. He currently takes Farxiga , which he understands helps with kidney function and diabetes.      ROS: Negative unless specifically indicated above in HPI.   Relevant past medical history reviewed and updated as indicated.   Allergies and medications reviewed and updated.  Current Medications[1]  Allergies[2]  Objective:   BP (!) 148/78   Pulse 63   Temp 97.7 F (36.5 C)   Resp 18   Ht 6' (1.829 m)   Wt 183 lb (83 kg)   SpO2 99%   BMI 24.82 kg/m    Physical Exam Vitals reviewed.  Constitutional:      General: He is not in acute distress.    Appearance: Normal appearance. He is not ill-appearing, toxic-appearing or diaphoretic.  HENT:     Head: Normocephalic and atraumatic.     Right Ear: Tympanic membrane, ear canal and external ear normal. There is no impacted cerumen.     Left Ear: Tympanic membrane, ear canal and external ear normal. There is no impacted cerumen.     Nose: Congestion present.     Right Sinus: Maxillary sinus tenderness present.  No frontal sinus tenderness.     Left Sinus: Maxillary sinus tenderness present. No frontal sinus tenderness.     Mouth/Throat:     Mouth: Mucous membranes are moist.     Pharynx: Oropharynx is clear. Posterior oropharyngeal erythema present. No oropharyngeal exudate.     Tonsils: No tonsillar exudate or  tonsillar abscesses.  Eyes:     General: No scleral icterus.       Right eye: No discharge.        Left eye: No discharge.     Conjunctiva/sclera: Conjunctivae normal.  Cardiovascular:     Rate and Rhythm: Normal rate.  Pulmonary:     Effort: Pulmonary effort is normal. No respiratory distress.  Musculoskeletal:        General: Normal range of motion.     Cervical back: Normal range of motion.  Lymphadenopathy:     Cervical: No cervical adenopathy.  Skin:    General: Skin is warm and dry.  Neurological:     Mental Status: He is alert and oriented to person, place, and time. Mental status is at baseline.  Psychiatric:        Mood and Affect: Mood normal.        Behavior: Behavior normal.        Thought Content: Thought content normal.        Judgment: Judgment normal.            [1]  Current Outpatient Medications:    acetaminophen  (TYLENOL ) 650 MG CR tablet, Take 650 mg by mouth every 8 (eight) hours as needed for pain., Disp: , Rfl:    amoxicillin -clavulanate (AUGMENTIN ) 875-125 MG tablet, Take 1 tablet by mouth 2 (two) times daily for 7 days., Disp: 14 tablet, Rfl: 0   aspirin  EC 81 MG tablet, Take 81 mg by mouth daily. Swallow whole., Disp: , Rfl:    carvedilol  (COREG ) 3.125 MG tablet, Take 1 tablet (3.125 mg total) by mouth 2 (two) times daily., Disp: 180 tablet, Rfl: 3   Cholecalciferol 50 MCG (2000 UT) TABS, Take 2 tablets by mouth daily., Disp: , Rfl:    dapagliflozin  propanediol (FARXIGA ) 10 MG TABS tablet, Take 1 tablet (10 mg total) by mouth daily before breakfast., Disp: 90 tablet, Rfl: 3   DULoxetine  HCl 40 MG CPEP, Take 1 capsule (40 mg total) by mouth daily., Disp: 90 capsule, Rfl: 3   finasteride  ([name] ) 5 MG tablet, Take 1 tablet (5 mg total) by mouth daily., Disp: 90 tablet, Rfl: 3   furosemide  (LASIX ) 40 MG tablet, Take 1 tab by mouth once daily as needed for wt > 180, Disp: 90 tablet, Rfl: 3   gabapentin  (NEURONTIN ) 100 MG capsule, Take 1 capsule (100  mg total) by mouth 3 (three) times daily. (Patient taking differently: Take 100 mg by mouth 2 (two) times daily.), Disp: 90 capsule, Rfl: 3   LORazepam  (ATIVAN ) 2 MG tablet, Take 1 tablet (2 mg total) by mouth every 8 (eight) hours as needed for anxiety., Disp: 90 tablet, Rfl: 2   melatonin 3 MG TABS tablet, Take 1 tablet by mouth daily., Disp: , Rfl:    nitroGLYCERIN  (NITROSTAT ) 0.4 MG SL tablet, Place 1 tablet (0.4 mg total) under the tongue every 5 (five) minutes as needed for chest pain., Disp: 50 tablet, Rfl: 3   omeprazole  (PRILOSEC) 20 MG capsule, Take 1 capsule (20 mg total) by mouth daily as needed., Disp: 90 capsule, Rfl: 3   rosuvastatin  (CRESTOR ) 20 MG tablet, Take 1  tablet (20 mg total) by mouth daily., Disp: 90 tablet, Rfl: 3   sacubitril -valsartan  (ENTRESTO ) 24-26 MG, Take 1 tablet by mouth 2 (two) times daily., Disp: 60 tablet, Rfl: 11   sildenafil  (REVATIO ) 20 MG tablet, Take 20 mg by mouth as needed., Disp: , Rfl:  [2]  Allergies Allergen Reactions   Codeine Other (See Comments)    hallucinations   Lisinopril  Cough   Nsaids     Not supposed to take because of kidneys    "

## 2024-09-12 NOTE — Assessment & Plan Note (Signed)
 Home readings around 130 mmHg; recent office reading 148 mmHg. Possible elevation due to illness. - Advised periodic home blood pressure monitoring. - Continue current management and lifestyle modifications.

## 2024-09-20 ENCOUNTER — Ambulatory Visit: Admitting: Licensed Clinical Social Worker

## 2024-09-23 ENCOUNTER — Ambulatory Visit: Admitting: Internal Medicine

## 2024-09-26 ENCOUNTER — Ambulatory Visit (INDEPENDENT_AMBULATORY_CARE_PROVIDER_SITE_OTHER): Admitting: Licensed Clinical Social Worker

## 2024-09-26 DIAGNOSIS — F411 Generalized anxiety disorder: Secondary | ICD-10-CM

## 2024-09-26 NOTE — Progress Notes (Signed)
 " Cardiology Office Note:    Date:  09/27/2024   ID:  Elsie Louder, DOB 10/26/1950, MRN [id number]  PCP:  Norleen Lynwood ORN, MD   [facility] Providers Cardiologist:  Darryle ONEIDA Decent, MD Cardiology APP:  Madie Jon Garre, [place]     Referring MD: Norleen Lynwood ORN, MD   Chief Complaint  Patient presents with   Follow-up    Chest pain    History of Present Illness:    Crandall Harvel is a 74 y.o. male with a hx of CAD s/p inferior STEMI and V-fib arrest in 12/2020 treated with PCI to the RCA with residual 50% proximal LAD stenosis, negative PET stress 01/28/2023 performed for dyspnea, chronic systolic heart failure felt ischemic cardiomyopathy with an LVEF 25-30% 12/2020 that improved to 55-60% on echocardiogram 06/2022.  History also includes CKD 3B and hyperlipidemia.  He was seen in the ER 08/04/24 with chest pain. He ruled out with negative enzymes and was discharged without admission. He described sharp chest pain that was intermittent and lasting several hours, resolved by the time he was evaluated. He now recalls that he was having stressful issues with technology on the preceding days.   He presents for annual cardiology follow-up.  He continues to have intermittent chest pain about twice per week and lasts seconds. No N/V, typically not diaphoretic or SOB. Most strenuous activity is lifting bottled water  at the grocery store. He walks his puppy about 30 minutes and can do this without walking. Puppy does pull on the leash.   He is able to work in the yard. Lifting can make him feel tired in his chest. His chest pain sounds atypical.    Past Medical History:  Diagnosis Date   Anoxic brain injury (HCC)    Anxiety    Anxiety    Arthritis    Bilateral hearing loss    Cancer (HCC) 2010   basal cell ca, head   Cataract    bilateral   CHF (congestive heart failure) (HCC)    Chronic systolic heart failure (HCC)    Coronary artery disease    Depression    ED (erectile  dysfunction)    GERD (gastroesophageal reflux disease)    Heart failure (HCC)    History of heart artery stent    History of ST elevation myocardial infarction (STEMI)    Hyperlipidemia    Hypertension    Kidney stone 07/29/2011   passed    Mood disorder    Myocardial infarction [facility])    Neuromuscular disorder (HCC)    neuropathy legs   Psoriasis    Stage 3b chronic kidney disease (CKD) (HCC)    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 Sydnor at [facility]     Past Surgical History:  Procedure Laterality Date   BASAL CELL CARCINOMA EXCISION     removed from scalp   CATARACT EXTRACTION W/PHACO Left 07/13/2023   Procedure: CATARACT EXTRACTION PHACO AND INTRAOCULAR LENS PLACEMENT (IOC) LEFT DIABETIC 3.20 00:29.8;  Surgeon: Myrna Adine Anes, MD;  Location: [facility];  Service: Ophthalmology;  Laterality: Left;   CATARACT EXTRACTION W/PHACO Right 08/03/2023   Procedure: CATARACT EXTRACTION PHACO AND INTRAOCULAR LENS PLACEMENT (IOC) RIGHT DIABETIC 2.37 00:22.9;  Surgeon: Myrna Adine Anes, MD;  Location: [facility];  Service: Ophthalmology;  Laterality: Right;   COLONOSCOPY  07/03/2017   per Dr. Leigh, sessile serrated  polyp, repeat in 5 yrs    HERNIA REPAIR     LEFT HEART CATH AND CORONARY ANGIOGRAPHY N/A 01/03/2021   Procedure: LEFT HEART CATH AND CORONARY ANGIOGRAPHY;  Surgeon: Burnard Debby LABOR, MD;  Location: [facility];  Service: Cardiovascular;  Laterality: N/A;   RIGHT HEART CATH N/A 01/03/2021   Procedure: RIGHT HEART CATH;  Surgeon: Cherrie Toribio SAUNDERS, MD;  Location: [facility];  Service: Cardiovascular;  Laterality: N/A;    Current Medications: Active Medications[1]   Allergies:   Codeine, Lisinopril , and Nsaids   Social History   Socioeconomic History   Marital status: Single    Spouse name: Not on file   Number of children: 0   Years of  education: Not on file   Highest education level: Master's degree (e.g., MA, MS, MEng, MEd, MSW, MBA)  Occupational History   Occupation: retired    Comment: runner, broadcasting/film/video   Occupation: Careers Adviser - [employer]  Tobacco Use   Smoking status: Never    Passive exposure: Never   Smokeless tobacco: Never  Vaping Use   Vaping status: Never Used  Substance and Sexual Activity   Alcohol use: Not Currently   Drug use: No   Sexual activity: Not on file  Other Topics Concern   Not on file  Social History Narrative   Lives alone--[place]      Has living will   Health care POA-- friend Chyrl   Would accept resuscitation--but no prolonged machines or tube feedings   Social Drivers of Health   Tobacco Use: Low Risk (09/27/2024)   Patient History    Smoking Tobacco Use: Never    Smokeless Tobacco Use: Never    Passive Exposure: Never  Financial Resource Strain: Low Risk (01/13/2024)   Overall Financial Resource Strain (CARDIA)    Difficulty of Paying Living Expenses: Not hard at all  Food Insecurity: No Food Insecurity (01/13/2024)   Hunger Vital Sign    Worried About Running Out of Food in the Last Year: Never true    Ran Out of Food in the Last Year: Never true  Transportation Needs: No Transportation Needs (01/13/2024)   PRAPARE - Administrator, Civil Service (Medical): No    Lack of Transportation (Non-Medical): No  Physical Activity: Sufficiently Active (01/13/2024)   Exercise Vital Sign    Days of Exercise per Week: 7 days    Minutes of Exercise per Session: 120 min  Stress: No Stress Concern Present (01/13/2024)   [employer] of Occupational Health - Occupational Stress Questionnaire    Feeling of Stress : Not at all  Social Connections: Moderately Integrated (01/13/2024)   Social Connection and Isolation Panel    Frequency of Communication with Friends and Family: More than three times a week    Frequency of Social Gatherings with Friends and Family: More than  three times a week    Attends Religious Services: More than 4 times per year    Active Member of Clubs or Organizations: Yes    Attends Banker Meetings: Never    Marital Status: Never married  Depression (PHQ2-9): Low Risk (09/12/2024)   Depression (PHQ2-9)    PHQ-2 Score: 0  Alcohol Screen: Low Risk (01/13/2024)   Alcohol Screen    Last Alcohol Screening Score (AUDIT): 0  Housing: Unknown (01/13/2024)   Housing Stability Vital Sign    Unable to Pay for Housing in the Last Year: No    Number of Times  Moved in the Last Year: Not on file    Homeless in the Last Year: No  Utilities: Not At Risk (01/13/2024)   AHC Utilities    Threatened with loss of utilities: No  Health Literacy: Adequate Health Literacy (01/13/2024)   B1300 Health Literacy    Frequency of need for help with medical instructions: Never     Family History: The patient's family history includes Alcohol abuse in his father; Alzheimer's disease in an other family member; Anxiety disorder in his mother; Arthritis in his mother; Cancer in his maternal aunt and maternal grandmother; Depression in his mother and another family member; Diabetes in an other family member; Hyperlipidemia in his mother and another family member; Hypertension in his mother and another family member; Varicose Veins in his maternal aunt. There is no history of Colon cancer, Stomach cancer, Rectal cancer, or Esophageal cancer.  ROS:   Please see the history of present illness.     All other systems reviewed and are negative.  EKGs/Labs/Other Studies Reviewed:    The following studies were reviewed today:  EKG Interpretation Date/Time:  Tuesday September 27 2024 13:28:00 EST Ventricular Rate:  63 PR Interval:  210 QRS Duration:  74 QT Interval:  388 QTC Calculation: 397 R Axis:   3  Text Interpretation: Sinus rhythm with 1st degree A-V block Minimal voltage criteria for LVH, may be normal variant ( R in aVL ) When compared with ECG  of 04-Aug-2024 15:32, No significant change was found Confirmed by Madie Slough ([id number]) on 09/27/2024 1:33:31 PM    Recent Labs: 12/29/2023: TSH 1.62 06/27/2024: ALT 20 08/04/2024: BUN 35; Creatinine, Ser 1.40; Hemoglobin 15.4; Platelets 159; Potassium 4.0; Sodium 138  Recent Lipid Panel    Component Value Date/Time   CHOL 128 06/27/2024 0820   CHOL 139 11/16/2019 0859   TRIG 84.0 06/27/2024 0820   HDL 50.20 06/27/2024 0820   HDL 60 11/16/2019 0859   CHOLHDL 3 06/27/2024 0820   VLDL 16.8 06/27/2024 0820   LDLCALC 61 06/27/2024 0820   LDLCALC 71 05/24/2020 0829   LDLDIRECT 141.9 04/06/2013 0843     Risk Assessment/Calculations:                Physical Exam:    VS:  BP 128/80 (BP Location: Left Arm, Patient Position: Sitting, Cuff Size: Normal)   Pulse 63   Ht 6' (1.829 m)   Wt 188 lb (85.3 kg)   SpO2 98%   BMI 25.50 kg/m     Wt Readings from Last 3 Encounters:  09/27/24 188 lb (85.3 kg)  09/12/24 183 lb (83 kg)  07/04/24 181 lb (82.1 kg)     GEN:  Well nourished, well developed in no acute distress HEENT: Normal NECK: No JVD; No carotid bruits LYMPHATICS: No lymphadenopathy CARDIAC: RRR, no murmurs, rubs, gallops RESPIRATORY:  Clear to auscultation without rales, wheezing or rhonchi  ABDOMEN: Soft, non-tender, non-distended MUSCULOSKELETAL:  No edema; No deformity  SKIN: Warm and dry NEUROLOGIC:  Alert and oriented x 3 PSYCHIATRIC:  Normal affect   ASSESSMENT:    1. Chest pain of uncertain etiology   2. Statin intolerance   3. CAD S/P percutaneous coronary angioplasty   4. Primary hypertension   5. Hyperlipidemia with target LDL less than 70   6. Chronic systolic heart failure (HCC)    PLAN:    In order of problems listed above:  Chest pain - atypical - can feel brief sharp pain after upper body movements (painting)  or with stress - not exertional (when walking his puppy) - will add on 2.5 mg amlodipine  to see if this helps - also advised tylenol   after house projects or strenuous upper body movements   CAD History of inferior STEMI complicated by V-fib arrest - DES-RCA - Residual proximal LAD disease - Reassuring PET stress test in 2024 - Remains on aspirin  monotherapy   Chronic systolic heart failure with improved LVEF - Felt ischemic in nature - Maintained on 3.125 mg Coreg  twice daily, 24-26 mg Entresto  twice daily, 10 mg Farxiga , 40 mg Lasix  as needed   Hyperlipidemia with LDL goal less than 70 - On 20 mg Crestor  12/29/2023: Cholesterol 156; HDL 52.50; LDL Cholesterol 85; Triglycerides 90.0; VLDL 18.0 - previously did not tolerate 40 mg -- will refer to pharmD for PCSK9i   Follow up with Dr. Barbaraann in 4 months.      Medication Adjustments/Labs and Tests Ordered: Current medicines are reviewed at length with the patient today.  Concerns regarding medicines are outlined above.  Orders Placed This Encounter  Procedures   AMB Referral to [facility] Pharm-D   EKG 12-Lead   Meds ordered this encounter  Medications   amLODipine  (NORVASC ) 2.5 MG tablet    Sig: Take 1 tablet nightly    Dispense:  90 tablet    Refill:  3    Patient Instructions  Medication Instructions:  Start Amlodipine  2.5 mg daily  *If you need a refill on your cardiac medications before your next appointment, please call your pharmacy*  Lab Work: NONE ordered at this time of appointment   Testing/Procedures: NONE ordered at this time of appointment  Follow-Up: At [facility], you and your health needs are our priority.  As part of our continuing mission to provide you with exceptional heart care, our providers are all part of one team.  This team includes your primary Cardiologist (physician) and Advanced Practice Providers or APPs (Physician Assistants and Nurse Practitioners) who all work together to provide you with the care you need, when you need it.  Your next appointment:   4 month(s)  Provider:   Darryle ONEIDA Barbaraann, MD     We recommend signing up for the patient portal called MyChart.  Sign up information is provided on this After Visit Summary.  MyChart is used to connect with patients for Virtual Visits (Telemedicine).  Patients are able to view lab/test results, encounter notes, upcoming appointments, etc.  Non-urgent messages can be sent to your provider as well.   To learn more about what you can do with MyChart, go to [email or web address].             Signed, Jon Nat Hails, PA  09/27/2024 2:15 PM    [facility]     [1]  Current Meds  Medication Sig   acetaminophen  (TYLENOL ) 650 MG CR tablet Take 650 mg by mouth every 8 (eight) hours as needed for pain.   amLODipine  (NORVASC ) 2.5 MG tablet Take 1 tablet nightly   aspirin  EC 81 MG tablet Take 81 mg by mouth daily. Swallow whole.   carvedilol  (COREG ) 3.125 MG tablet Take 1 tablet (3.125 mg total) by mouth 2 (two) times daily.   Cholecalciferol 50 MCG (2000 UT) TABS Take 2 tablets by mouth daily.   dapagliflozin  propanediol (FARXIGA ) 10 MG TABS tablet Take 1 tablet (10 mg total) by mouth daily before breakfast.   DULoxetine  HCl 40 MG CPEP Take 1 capsule (40 mg total) by mouth daily.  finasteride  ([name] ) 5 MG tablet Take 1 tablet (5 mg total) by mouth daily.   furosemide  (LASIX ) 40 MG tablet Take 1 tab by mouth once daily as needed for wt > 180 (Patient taking differently: Take 40 mg by mouth daily as needed. Take 1 tab by mouth once daily as needed for wt > 180)   gabapentin  (NEURONTIN ) 100 MG capsule Take 1 capsule (100 mg total) by mouth 3 (three) times daily. (Patient taking differently: Take 100 mg by mouth 2 (two) times daily.)   LORazepam  (ATIVAN ) 2 MG tablet Take 1 tablet (2 mg total) by mouth every 8 (eight) hours as needed for anxiety.   melatonin 3 MG TABS tablet Take 1 tablet by mouth daily.   nitroGLYCERIN  (NITROSTAT ) 0.4 MG SL tablet Place 1 tablet (0.4 mg total) under the tongue every 5 (five) minutes as needed for  chest pain.   omeprazole  (PRILOSEC) 20 MG capsule Take 1 capsule (20 mg total) by mouth daily as needed.   rosuvastatin  (CRESTOR ) 20 MG tablet Take 1 tablet (20 mg total) by mouth daily.   sacubitril -valsartan  (ENTRESTO ) 24-26 MG Take 1 tablet by mouth 2 (two) times daily.   sildenafil  (REVATIO ) 20 MG tablet Take 20 mg by mouth as needed.   "

## 2024-09-26 NOTE — Progress Notes (Signed)
 [facility] Counselor/Therapist Progress Note  Patient ID: [name], MRN: [id number]    Date: 09/26/2024  Time Spent: 103  pm - 0200 pm : 58 Minutes  Treatment Type: Individual Therapy.  Reported Symptoms: ruminating thoughts, muscle tension, feeling flushed when feeling anxious   Mental Status Exam: Appearance:  Neat and Well Groomed     Behavior: Appropriate  Motor: Normal  Speech/Language:  Clear and Coherent and Normal Rate  Affect: Appropriate  Mood: normal  Thought process: normal  Thought content:   WNL  Sensory/Perceptual disturbances:   WNL  Orientation: oriented to person, place, and situation  Attention: Good  Concentration: Good  Memory: WNL  Fund of knowledge:  Good  Insight:   Good  Judgment:  Good  Impulse Control: Good    Risk Assessment: Danger to Self:  No Patient denied current suicidal ideation  Self-injurious Behavior: No Danger to Others: No Patient denied current homicidal ideation Duty to Warn:no Physical Aggression /Violence:No  Access to Firearms a concern: No  Gang Involvement:No    [name] presented for his session in person at the [employer]. Clinician presented from her office located at the [facility]. [name] consented to treatment.   [name] presented for his session, stating that on 08/04/2024 he went to the ED for heart pain. He reports that it was nothing and they believe that it was a pulled muscle. He reports that he has been taking 2 and 1/3 to 3 Lorazepam  daily. He states that his anxiety was elevated. He reports that it could be the holidays and he reports that he also has trouble dealing with people who are hateful. He reports that there are women in his card group that are bothersome. He reports that he has been working on staying his lane but feels that he is enabling her poor behavior.   Clinician actively listened and provided support and feedback. Clinician and patient processed his  improvement. Patient and Clinician processed memories of his childhood and his father. Patient reports there are few memories that are good of his father. Clinician and patient processed the unhealthy dynamics of the family due to patients father's behaviors. [name] reports that his sister has been making an effort to be more kind.      [name] was fully involved in session discussion with therapist. Patient was polite and cooperative and motivated for treatment as evidenced by his engagement in therapy and use of coping skills. [name] will reduce overall level, frequency, and intensity of the feelings of depression and anxiety evidenced by decreased irritability, negative self talk, and helpless feelings from 6 to 7 days/week to 0 to 1 days/week per client report for at least 3 consecutive months. Patient is to use CBT, mindfulness and coping skills to help manage decrease symptoms associated with their diagnosis. Treatment planning to be reviewed by 12/15/2024.     Interventions: Cognitive Behavioral Therapy, Dialectical Behavioral Therapy, Assertiveness/Communication, Mindfulness Meditation, and Solution-Oriented/Positive Psychology   Diagnosis: Generalized anxiety Disorder     Damien Junk MSW, LCSW/DATE 09/26/2024

## 2024-09-27 ENCOUNTER — Ambulatory Visit: Attending: Physician Assistant | Admitting: Physician Assistant

## 2024-09-27 ENCOUNTER — Encounter: Payer: Self-pay | Admitting: Physician Assistant

## 2024-09-27 ENCOUNTER — Ambulatory Visit: Admitting: Podiatry

## 2024-09-27 VITALS — BP 128/80 | HR 63 | Ht 72.0 in | Wt 188.0 lb

## 2024-09-27 DIAGNOSIS — I10 Essential (primary) hypertension: Secondary | ICD-10-CM

## 2024-09-27 DIAGNOSIS — E785 Hyperlipidemia, unspecified: Secondary | ICD-10-CM

## 2024-09-27 DIAGNOSIS — R079 Chest pain, unspecified: Secondary | ICD-10-CM | POA: Diagnosis not present

## 2024-09-27 DIAGNOSIS — Z789 Other specified health status: Secondary | ICD-10-CM

## 2024-09-27 DIAGNOSIS — I5022 Chronic systolic (congestive) heart failure: Secondary | ICD-10-CM | POA: Diagnosis not present

## 2024-09-27 DIAGNOSIS — Z9861 Coronary angioplasty status: Secondary | ICD-10-CM | POA: Diagnosis not present

## 2024-09-27 DIAGNOSIS — I2510 Atherosclerotic heart disease of native coronary artery without angina pectoris: Secondary | ICD-10-CM

## 2024-09-27 MED ORDER — AMLODIPINE BESYLATE 2.5 MG PO TABS: ORAL_TABLET | ORAL | 3 refills | Status: AC

## 2024-09-27 NOTE — Patient Instructions (Addendum)
 Medication Instructions:  Start Amlodipine  2.5 mg daily  *If you need a refill on your cardiac medications before your next appointment, please call your pharmacy*  Lab Work: NONE ordered at this time of appointment   Testing/Procedures: NONE ordered at this time of appointment  Follow-Up: At [facility], you and your health needs are our priority.  As part of our continuing mission to provide you with exceptional heart care, our providers are all part of one team.  This team includes your primary Cardiologist (physician) and Advanced Practice Providers or APPs (Physician Assistants and Nurse Practitioners) who all work together to provide you with the care you need, when you need it.  Your next appointment:   4 month(s) Dr. Barbaraann Next available Pharm-D apt  Provider:   Darryle ONEIDA Barbaraann, MD    We recommend signing up for the patient portal called MyChart.  Sign up information is provided on this After Visit Summary.  MyChart is used to connect with patients for Virtual Visits (Telemedicine).  Patients are able to view lab/test results, encounter notes, upcoming appointments, etc.  Non-urgent messages can be sent to your provider as well.   To learn more about what you can do with MyChart, go to [email or web address].

## 2024-10-12 ENCOUNTER — Ambulatory Visit: Admitting: Licensed Clinical Social Worker

## 2024-10-13 ENCOUNTER — Ambulatory Visit: Admitting: Podiatry

## 2024-10-24 ENCOUNTER — Ambulatory Visit: Admitting: Licensed Clinical Social Worker

## 2024-10-24 DIAGNOSIS — F439 Reaction to severe stress, unspecified: Secondary | ICD-10-CM

## 2024-10-24 DIAGNOSIS — F411 Generalized anxiety disorder: Secondary | ICD-10-CM | POA: Diagnosis not present

## 2024-10-24 NOTE — Progress Notes (Signed)
 [facility] Counselor/Therapist Progress Note  Patient ID: Tyshon Fanning, MRN: [id number]    Date: 10/24/24  Time Spent: 1104  am - 1200 pm : 56 Minutes  Treatment Type: Individual Therapy.  Reported Symptoms: ruminating thoughts, muscle tension, feeling flushed when feeling anxious   Mental Status Exam: Appearance:  Neat and Well Groomed     Behavior: Appropriate  Motor: Normal  Speech/Language:  Clear and Coherent and Normal Rate  Affect: Appropriate  Mood: normal  Thought process: normal  Thought content:   WNL  Sensory/Perceptual disturbances:   WNL  Orientation: oriented to person, place, and situation  Attention: Good  Concentration: Good  Memory: WNL  Fund of knowledge:  Good  Insight:   Good  Judgment:  Good  Impulse Control: Good    Risk Assessment: Danger to Self:  No Patient denied current suicidal ideation  Self-injurious Behavior: No Danger to Others: No Patient denied current homicidal ideation Duty to Warn:no Physical Aggression /Violence:No  Access to Firearms a concern: No  Gang Involvement:No    Elsie [name] Louder presented from home for his session via video. Clinician presented from her home office.  Patient is aware of risk and limitations. [name] consented to treatment.    Patient presented for his session stating that he has been doing well. He states that he fell while walking his dog. He reports having a bruise on his hand but being fine otherwise. Patient reports that he continues to worry about the economy and the states of our country. [name] reports that he tries to only watch news once daily. He reports that he is also continuing to work on how he responds to people who are very rude and inconsiderate. [name] states that he is working to just walk away and not react.   Clinician actively listened and provided support and feedback. Clinician and patient processed his improvement. Patient and Clinician processed his concerns of  the country and the issues with ICE and how we cannot be controlled by fear. We processed being wise and doing what is smart but still enjoying life.  [name] reports that his sister has been making an effort to be more kind and that has been helpful.     [name] was fully involved in session discussion with therapist. Patient was polite and cooperative and motivated for treatment as evidenced by his engagement in therapy and use of coping skills. [name] will reduce overall level, frequency, and intensity of the feelings of depression and anxiety evidenced by decreased irritability, negative self talk, and helpless feelings from 6 to 7 days/week to 0 to 1 days/week per client report for at least 3 consecutive months. Patient is to use CBT, mindfulness and coping skills to help manage decrease symptoms associated with their diagnosis. Treatment planning to be reviewed by 12/15/2024.     Interventions: Cognitive Behavioral Therapy, Dialectical Behavioral Therapy, Assertiveness/Communication, Mindfulness Meditation, and Solution-Oriented/Positive Psychology   Diagnosis: Generalized anxiety Disorder        Damien Junk MSW, LCSW/DATE 10/24/2024

## 2024-10-26 ENCOUNTER — Ambulatory Visit: Admitting: Family Medicine

## 2024-11-01 ENCOUNTER — Ambulatory Visit: Admitting: Podiatry

## 2024-11-07 ENCOUNTER — Ambulatory Visit: Admitting: Licensed Clinical Social Worker

## 2024-11-14 ENCOUNTER — Ambulatory Visit: Admitting: Pharmacist

## 2025-01-02 ENCOUNTER — Ambulatory Visit: Admitting: Internal Medicine

## 2025-01-23 ENCOUNTER — Ambulatory Visit

## 2025-02-20 ENCOUNTER — Encounter: Admitting: Family Medicine

## 2025-04-06 ENCOUNTER — Encounter: Admitting: Family Medicine

## 2025-07-05 ENCOUNTER — Encounter: Admitting: Internal Medicine
